# Patient Record
Sex: Male | Born: 1960 | Race: White | Hispanic: No | Marital: Married | Smoking: Never smoker
Health system: Southern US, Community
[De-identification: ages and names within clinical notes are randomized; demographics above are authoritative.]

## PROBLEM LIST (undated history)

## (undated) DIAGNOSIS — I10 Essential (primary) hypertension: Secondary | ICD-10-CM

## (undated) DIAGNOSIS — N281 Cyst of kidney, acquired: Secondary | ICD-10-CM

## (undated) DIAGNOSIS — K824 Cholesterolosis of gallbladder: Secondary | ICD-10-CM

## (undated) DIAGNOSIS — K589 Irritable bowel syndrome without diarrhea: Secondary | ICD-10-CM

## (undated) DIAGNOSIS — H547 Unspecified visual loss: Secondary | ICD-10-CM

## (undated) DIAGNOSIS — K859 Acute pancreatitis without necrosis or infection, unspecified: Secondary | ICD-10-CM

## (undated) DIAGNOSIS — K760 Fatty (change of) liver, not elsewhere classified: Secondary | ICD-10-CM

## (undated) DIAGNOSIS — K08109 Complete loss of teeth, unspecified cause, unspecified class: Secondary | ICD-10-CM

## (undated) DIAGNOSIS — I499 Cardiac arrhythmia, unspecified: Secondary | ICD-10-CM

## (undated) DIAGNOSIS — Z8639 Personal history of other endocrine, nutritional and metabolic disease: Secondary | ICD-10-CM

## (undated) DIAGNOSIS — R011 Cardiac murmur, unspecified: Secondary | ICD-10-CM

## (undated) DIAGNOSIS — J45909 Unspecified asthma, uncomplicated: Secondary | ICD-10-CM

## (undated) DIAGNOSIS — J449 Chronic obstructive pulmonary disease, unspecified: Secondary | ICD-10-CM

## (undated) DIAGNOSIS — K8010 Calculus of gallbladder with chronic cholecystitis without obstruction: Secondary | ICD-10-CM

## (undated) DIAGNOSIS — E119 Type 2 diabetes mellitus without complications: Secondary | ICD-10-CM

## (undated) DIAGNOSIS — E785 Hyperlipidemia, unspecified: Secondary | ICD-10-CM

## (undated) DIAGNOSIS — Z9889 Other specified postprocedural states: Secondary | ICD-10-CM

## (undated) DIAGNOSIS — K219 Gastro-esophageal reflux disease without esophagitis: Secondary | ICD-10-CM

## (undated) DIAGNOSIS — K5790 Diverticulosis of intestine, part unspecified, without perforation or abscess without bleeding: Secondary | ICD-10-CM

## (undated) DIAGNOSIS — R112 Nausea with vomiting, unspecified: Secondary | ICD-10-CM

## (undated) DIAGNOSIS — F79 Unspecified intellectual disabilities: Secondary | ICD-10-CM

## (undated) DIAGNOSIS — Z8619 Personal history of other infectious and parasitic diseases: Secondary | ICD-10-CM

## (undated) HISTORY — DX: Unspecified intellectual disabilities: F79

## (undated) HISTORY — DX: Cardiac arrhythmia, unspecified: I49.9

## (undated) HISTORY — DX: Irritable bowel syndrome, unspecified: K58.9

## (undated) HISTORY — DX: Essential (primary) hypertension: I10

## (undated) HISTORY — DX: Type 2 diabetes mellitus without complications: E11.9

## (undated) HISTORY — PX: HEMORRHOID SURGERY: SHX153

## (undated) HISTORY — PX: WISDOM TOOTH EXTRACTION: SHX21

## (undated) HISTORY — DX: Personal history of other infectious and parasitic diseases: Z86.19

## (undated) HISTORY — PX: COLONOSCOPY: SHX174

## (undated) HISTORY — DX: Acute pancreatitis without necrosis or infection, unspecified: K85.90

## (undated) HISTORY — DX: Cyst of kidney, acquired: N28.1

## (undated) HISTORY — DX: Gastro-esophageal reflux disease without esophagitis: K21.9

## (undated) HISTORY — DX: Personal history of other endocrine, nutritional and metabolic disease: Z86.39

## (undated) HISTORY — PX: CARDIAC CATHETERIZATION: SHX172

## (undated) HISTORY — DX: Unspecified asthma, uncomplicated: J45.909

## (undated) HISTORY — PX: KNEE SURGERY: SHX244

## (undated) HISTORY — PX: OTHER SURGICAL HISTORY: SHX169

## (undated) HISTORY — DX: Hyperlipidemia, unspecified: E78.5

---

## 2003-12-04 ENCOUNTER — Other Ambulatory Visit: Payer: Self-pay

## 2004-05-15 ENCOUNTER — Emergency Department: Payer: Self-pay | Admitting: Emergency Medicine

## 2004-05-15 ENCOUNTER — Other Ambulatory Visit: Payer: Self-pay

## 2004-10-05 ENCOUNTER — Emergency Department: Payer: Self-pay | Admitting: Emergency Medicine

## 2004-10-06 ENCOUNTER — Emergency Department: Payer: Self-pay | Admitting: Emergency Medicine

## 2004-12-10 ENCOUNTER — Emergency Department: Payer: Self-pay | Admitting: Emergency Medicine

## 2005-03-11 ENCOUNTER — Emergency Department: Payer: Self-pay | Admitting: Unknown Physician Specialty

## 2005-08-26 ENCOUNTER — Other Ambulatory Visit: Payer: Self-pay

## 2005-08-26 ENCOUNTER — Emergency Department: Payer: Self-pay | Admitting: Emergency Medicine

## 2005-08-27 ENCOUNTER — Ambulatory Visit: Payer: Self-pay | Admitting: Emergency Medicine

## 2005-10-10 ENCOUNTER — Ambulatory Visit: Payer: Self-pay | Admitting: Family Medicine

## 2005-10-11 ENCOUNTER — Ambulatory Visit: Payer: Self-pay | Admitting: Family Medicine

## 2005-10-31 ENCOUNTER — Emergency Department: Payer: Self-pay | Admitting: Internal Medicine

## 2006-03-02 ENCOUNTER — Emergency Department: Payer: Self-pay | Admitting: Emergency Medicine

## 2006-04-09 ENCOUNTER — Emergency Department: Payer: Self-pay | Admitting: Emergency Medicine

## 2006-05-18 ENCOUNTER — Emergency Department: Payer: Self-pay | Admitting: Emergency Medicine

## 2006-05-21 ENCOUNTER — Ambulatory Visit: Payer: Self-pay | Admitting: Emergency Medicine

## 2006-09-05 ENCOUNTER — Emergency Department: Payer: Self-pay | Admitting: Emergency Medicine

## 2006-09-09 ENCOUNTER — Emergency Department: Payer: Self-pay | Admitting: Internal Medicine

## 2006-09-20 ENCOUNTER — Emergency Department: Payer: Self-pay | Admitting: Internal Medicine

## 2006-09-22 ENCOUNTER — Other Ambulatory Visit: Payer: Self-pay

## 2006-09-22 ENCOUNTER — Emergency Department: Payer: Self-pay | Admitting: Emergency Medicine

## 2007-02-23 ENCOUNTER — Inpatient Hospital Stay: Payer: Self-pay | Admitting: Internal Medicine

## 2007-04-11 ENCOUNTER — Emergency Department: Payer: Self-pay | Admitting: Emergency Medicine

## 2007-05-30 ENCOUNTER — Other Ambulatory Visit: Payer: Self-pay

## 2007-05-30 ENCOUNTER — Emergency Department: Payer: Self-pay | Admitting: Emergency Medicine

## 2007-07-28 ENCOUNTER — Other Ambulatory Visit: Payer: Self-pay

## 2007-07-28 ENCOUNTER — Ambulatory Visit: Payer: Self-pay | Admitting: Urology

## 2007-10-25 ENCOUNTER — Emergency Department: Payer: Self-pay | Admitting: Emergency Medicine

## 2007-10-26 ENCOUNTER — Other Ambulatory Visit: Payer: Self-pay

## 2007-11-02 ENCOUNTER — Emergency Department: Payer: Self-pay | Admitting: Emergency Medicine

## 2007-12-26 ENCOUNTER — Encounter: Payer: Self-pay | Admitting: Podiatry

## 2007-12-31 ENCOUNTER — Emergency Department: Payer: Self-pay | Admitting: Emergency Medicine

## 2008-01-18 ENCOUNTER — Encounter: Payer: Self-pay | Admitting: Podiatry

## 2008-05-09 ENCOUNTER — Inpatient Hospital Stay: Payer: Self-pay | Admitting: Internal Medicine

## 2008-05-11 ENCOUNTER — Ambulatory Visit: Payer: Self-pay | Admitting: Cardiology

## 2008-08-30 ENCOUNTER — Ambulatory Visit: Payer: Self-pay | Admitting: Family Medicine

## 2008-10-12 ENCOUNTER — Emergency Department: Payer: Self-pay | Admitting: Emergency Medicine

## 2008-12-27 ENCOUNTER — Emergency Department: Payer: Self-pay | Admitting: Emergency Medicine

## 2009-02-09 ENCOUNTER — Inpatient Hospital Stay: Payer: Self-pay | Admitting: Internal Medicine

## 2009-04-26 ENCOUNTER — Emergency Department: Payer: Self-pay | Admitting: Emergency Medicine

## 2009-04-30 ENCOUNTER — Inpatient Hospital Stay: Payer: Self-pay | Admitting: Internal Medicine

## 2009-10-23 ENCOUNTER — Emergency Department: Payer: Self-pay | Admitting: Emergency Medicine

## 2009-11-28 ENCOUNTER — Ambulatory Visit: Payer: Self-pay | Admitting: Family Medicine

## 2010-02-20 ENCOUNTER — Inpatient Hospital Stay: Payer: Self-pay | Admitting: Internal Medicine

## 2011-03-20 DIAGNOSIS — E119 Type 2 diabetes mellitus without complications: Secondary | ICD-10-CM

## 2011-03-20 HISTORY — DX: Type 2 diabetes mellitus without complications: E11.9

## 2012-03-05 ENCOUNTER — Emergency Department: Payer: Self-pay | Admitting: Emergency Medicine

## 2012-12-26 ENCOUNTER — Ambulatory Visit: Payer: Self-pay | Admitting: Family Medicine

## 2013-01-20 ENCOUNTER — Ambulatory Visit: Payer: Self-pay | Admitting: Nephrology

## 2013-09-30 LAB — PSA: PSA: 0.3

## 2013-10-01 ENCOUNTER — Ambulatory Visit: Payer: Self-pay | Admitting: Family Medicine

## 2013-10-01 DIAGNOSIS — K824 Cholesterolosis of gallbladder: Secondary | ICD-10-CM | POA: Insufficient documentation

## 2014-04-27 DIAGNOSIS — J45909 Unspecified asthma, uncomplicated: Secondary | ICD-10-CM | POA: Diagnosis not present

## 2014-04-27 DIAGNOSIS — E785 Hyperlipidemia, unspecified: Secondary | ICD-10-CM | POA: Diagnosis not present

## 2014-04-27 DIAGNOSIS — J40 Bronchitis, not specified as acute or chronic: Secondary | ICD-10-CM | POA: Diagnosis not present

## 2014-04-27 DIAGNOSIS — E119 Type 2 diabetes mellitus without complications: Secondary | ICD-10-CM | POA: Diagnosis not present

## 2014-04-27 LAB — LIPID PANEL
Cholesterol: 158 mg/dL (ref 0–200)
HDL: 32 mg/dL — AB (ref 35–70)
LDL Cholesterol: 87 mg/dL
TRIGLYCERIDES: 197 mg/dL — AB (ref 40–160)

## 2014-06-09 DIAGNOSIS — R079 Chest pain, unspecified: Secondary | ICD-10-CM | POA: Diagnosis not present

## 2014-06-09 DIAGNOSIS — I10 Essential (primary) hypertension: Secondary | ICD-10-CM | POA: Diagnosis not present

## 2014-06-09 DIAGNOSIS — R0602 Shortness of breath: Secondary | ICD-10-CM | POA: Insufficient documentation

## 2014-06-09 DIAGNOSIS — E119 Type 2 diabetes mellitus without complications: Secondary | ICD-10-CM | POA: Diagnosis not present

## 2014-06-09 DIAGNOSIS — E780 Pure hypercholesterolemia: Secondary | ICD-10-CM | POA: Diagnosis not present

## 2014-06-23 DIAGNOSIS — R0602 Shortness of breath: Secondary | ICD-10-CM | POA: Diagnosis not present

## 2014-06-23 DIAGNOSIS — R079 Chest pain, unspecified: Secondary | ICD-10-CM | POA: Diagnosis not present

## 2014-06-23 HISTORY — PX: NM GATED MYOCARDIAL STUDY (ARMX HX): HXRAD1849

## 2014-07-28 DIAGNOSIS — R5383 Other fatigue: Secondary | ICD-10-CM | POA: Diagnosis not present

## 2014-07-28 DIAGNOSIS — E119 Type 2 diabetes mellitus without complications: Secondary | ICD-10-CM | POA: Diagnosis not present

## 2014-07-28 DIAGNOSIS — J439 Emphysema, unspecified: Secondary | ICD-10-CM | POA: Diagnosis not present

## 2014-07-28 LAB — CBC AND DIFFERENTIAL
HCT: 44 % (ref 41–53)
HCT: 44 % (ref 41–53)
HEMOGLOBIN: 14.8 g/dL (ref 13.5–17.5)
HEMOGLOBIN: 14.8 g/dL (ref 13.5–17.5)
PLATELETS: 172 10*3/uL (ref 150–399)
Platelets: 172 10*3/uL (ref 150–399)
WBC: 5.7 10^3/mL
WBC: 5.7 10^3/mL

## 2014-07-28 LAB — BASIC METABOLIC PANEL
BUN: 8 mg/dL (ref 4–21)
BUN: 8 mg/dL (ref 4–21)
CREATININE: 0.8 mg/dL (ref 0.6–1.3)
Creatinine: 0.8 mg/dL (ref 0.6–1.3)
Glucose: 173 mg/dL
Glucose: 173 mg/dL
Potassium: 4.5 mmol/L (ref 3.4–5.3)
Potassium: 4.5 mmol/L (ref 3.4–5.3)
SODIUM: 137 mmol/L (ref 137–147)
Sodium: 137 mmol/L (ref 137–147)

## 2014-07-28 LAB — TSH
TSH: 1.67 u[IU]/mL (ref 0.41–5.90)
TSH: 1.67 u[IU]/mL (ref 0.41–5.90)

## 2014-07-28 LAB — HEPATIC FUNCTION PANEL
ALT: 76 U/L — AB (ref 10–40)
ALT: 76 U/L — AB (ref 10–40)
AST: 54 U/L — AB (ref 14–40)
AST: 54 U/L — AB (ref 14–40)

## 2014-07-28 LAB — HEMOGLOBIN A1C
Hgb A1c MFr Bld: 7.3 % — AB (ref 4.0–6.0)
Hgb A1c MFr Bld: 7.3 % — AB (ref 4.0–6.0)

## 2014-09-21 ENCOUNTER — Other Ambulatory Visit: Payer: Self-pay

## 2014-09-21 DIAGNOSIS — E119 Type 2 diabetes mellitus without complications: Secondary | ICD-10-CM

## 2014-09-21 MED ORDER — GLIPIZIDE ER 2.5 MG PO TB24: 2.5000 mg | ORAL_TABLET | ORAL | Status: DC

## 2014-09-21 NOTE — Telephone Encounter (Signed)
Pharmacy request refill.

## 2014-09-29 ENCOUNTER — Telehealth: Payer: Self-pay | Admitting: Family Medicine

## 2014-09-29 NOTE — Telephone Encounter (Signed)
Pt's wife called saying his One Touch meter is broke.  Still works but the cap is broken off.  She wants to know if you have anymore samples.  You gave him this last one.  She does not want to have to pay for a new one.  Her call back is [phone].

## 2014-09-30 NOTE — Telephone Encounter (Signed)
OK to give a sample One touch meter if we have one, If not then call one into his pharmacy. Thanks.

## 2014-10-04 NOTE — Telephone Encounter (Signed)
Patient notified that meter ready to pick-up.

## 2014-10-07 ENCOUNTER — Encounter: Payer: Self-pay | Admitting: Family Medicine

## 2014-10-07 ENCOUNTER — Ambulatory Visit (INDEPENDENT_AMBULATORY_CARE_PROVIDER_SITE_OTHER): Payer: Medicare Other | Admitting: Family Medicine

## 2014-10-07 VITALS — BP 120/72 | HR 58 | Temp 97.7°F | Resp 16 | Wt 189.0 lb

## 2014-10-07 DIAGNOSIS — I499 Cardiac arrhythmia, unspecified: Secondary | ICD-10-CM | POA: Insufficient documentation

## 2014-10-07 DIAGNOSIS — E701 Other hyperphenylalaninemias: Secondary | ICD-10-CM | POA: Insufficient documentation

## 2014-10-07 DIAGNOSIS — G44209 Tension-type headache, unspecified, not intractable: Secondary | ICD-10-CM

## 2014-10-07 DIAGNOSIS — E700 Classical phenylketonuria: Secondary | ICD-10-CM | POA: Insufficient documentation

## 2014-10-07 DIAGNOSIS — M25539 Pain in unspecified wrist: Secondary | ICD-10-CM | POA: Insufficient documentation

## 2014-10-07 DIAGNOSIS — R519 Headache, unspecified: Secondary | ICD-10-CM | POA: Insufficient documentation

## 2014-10-07 DIAGNOSIS — N281 Cyst of kidney, acquired: Secondary | ICD-10-CM | POA: Insufficient documentation

## 2014-10-07 DIAGNOSIS — Z8719 Personal history of other diseases of the digestive system: Secondary | ICD-10-CM | POA: Insufficient documentation

## 2014-10-07 DIAGNOSIS — R109 Unspecified abdominal pain: Secondary | ICD-10-CM | POA: Insufficient documentation

## 2014-10-07 DIAGNOSIS — M25519 Pain in unspecified shoulder: Secondary | ICD-10-CM | POA: Insufficient documentation

## 2014-10-07 DIAGNOSIS — R079 Chest pain, unspecified: Secondary | ICD-10-CM | POA: Insufficient documentation

## 2014-10-07 DIAGNOSIS — R5383 Other fatigue: Secondary | ICD-10-CM | POA: Insufficient documentation

## 2014-10-07 DIAGNOSIS — R51 Headache: Secondary | ICD-10-CM

## 2014-10-07 DIAGNOSIS — K859 Acute pancreatitis without necrosis or infection, unspecified: Secondary | ICD-10-CM | POA: Insufficient documentation

## 2014-10-07 DIAGNOSIS — M542 Cervicalgia: Secondary | ICD-10-CM | POA: Diagnosis not present

## 2014-10-07 DIAGNOSIS — R062 Wheezing: Secondary | ICD-10-CM | POA: Diagnosis not present

## 2014-10-07 DIAGNOSIS — IMO0002 Reserved for concepts with insufficient information to code with codable children: Secondary | ICD-10-CM | POA: Insufficient documentation

## 2014-10-07 DIAGNOSIS — K589 Irritable bowel syndrome without diarrhea: Secondary | ICD-10-CM | POA: Insufficient documentation

## 2014-10-07 DIAGNOSIS — R740 Nonspecific elevation of levels of transaminase and lactic acid dehydrogenase [LDH]: Secondary | ICD-10-CM

## 2014-10-07 MED ORDER — TIZANIDINE HCL 4 MG PO TABS: 4.0000 mg | ORAL_TABLET | Freq: Four times a day (QID) | ORAL | Status: DC | PRN

## 2014-10-07 MED ORDER — MONTELUKAST SODIUM 10 MG PO TABS: 10.0000 mg | ORAL_TABLET | Freq: Every day | ORAL | Status: DC

## 2014-10-07 MED ORDER — AZITHROMYCIN 250 MG PO TABS: ORAL_TABLET | ORAL | Status: AC

## 2014-10-07 NOTE — Progress Notes (Signed)
Patient: [name] Male    DOB: 19-Nov-1960   54 y.o.   MRN: [id number] Visit Date: 10/09/2014  Today's Provider: Lelon Huh, MD   Chief Complaint  Patient presents with  . Headache    x 2 weeks   Subjective:    Headache  This is a new problem. Episode onset: 2 weeks. The problem occurs intermittently. The problem has been unchanged. The pain is located in the bilateral and occipital region. The pain radiates to the right neck and left neck. The pain quality is not similar to prior headaches. The quality of the pain is described as sharp and shooting. Associated symptoms include abdominal pain, anorexia, back pain, coughing, dizziness, neck pain, tinnitus (right ear), weakness and weight loss. Pertinent negatives include no blurred vision, drainage, ear pain, eye pain, eye redness, eye watering, facial sweating, fever, hearing loss, rhinorrhea, seizures, sinus pressure, sore throat, swollen glands, visual change or vomiting. Exacerbated by: turning head. He has tried nothing for the symptoms.   Wheezing States he has been wheezing more in the last couple of weeks associated with coughing and being a little short of breath. He doesn't think he inhalers are working very well.      Allergies  Allergen Reactions  . Bee Venom     All kinds of bees  . Other     Certain powders   Previous Medications   FLUTICASONE (FLOVENT HFA) 110 MCG/ACT INHALER    Inhale 2 puffs into the lungs daily.   FLUTICASONE PROPIONATE, INHAL, (FLOVENT DISKUS) 100 MCG/BLIST AEPB    Inhale 1 puff into the lungs 2 (two) times daily.   GLIPIZIDE (GLUCOTROL XL) 2.5 MG 24 HR TABLET    Take 1 tablet by mouth every morning.   GLUCOSE BLOOD TEST STRIP    1 strip daily.   GUAIFENESIN (MUCINEX) 600 MG 12 HR TABLET    Take 1 tablet by mouth daily as needed.   LISINOPRIL (PRINIVIL,ZESTRIL) 20 MG TABLET    Take 1 tablet by mouth daily.   LOPERAMIDE-SIMETHICONE (IMODIUM MULTI-SYMPTOM RELIEF) 2-125 MG TABS     Take 1 tablet by mouth daily as needed.   METFORMIN (GLUCOPHAGE-XR) 500 MG 24 HR TABLET    Take 2 tablets by mouth daily.   NABUMETONE (RELAFEN) 750 MG TABLET    Take 1 tablet by mouth 2 (two) times daily as needed. For pain   OMEPRAZOLE 20 MG TBEC    Take 1 tablet by mouth daily.   ONETOUCH DELICA LANCETS FINE MISC    1 application.   OXYMETAZOLINE (AFRIN NODRIP SINUS) 0.05 % NASAL SPRAY    Place 1 spray into the nose daily as needed.   SIMVASTATIN (ZOCOR) 20 MG TABLET    Take 1 tablet by mouth daily.   TRIAMCINOLONE CREAM (KENALOG) 0.5 %    Apply 1 application topically 2 (two) times daily. to affected area    Review of Systems  Constitutional: Positive for weight loss. Negative for fever.  HENT: Positive for tinnitus (right ear). Negative for ear pain, hearing loss, rhinorrhea, sinus pressure and sore throat.   Eyes: Negative for blurred vision, pain and redness.  Respiratory: Positive for cough and wheezing.   Gastrointestinal: Positive for abdominal pain and anorexia. Negative for vomiting.  Musculoskeletal: Positive for back pain and neck pain.  Neurological: Positive for dizziness, weakness and headaches. Negative for seizures.    History  Substance Use Topics  . Smoking status: Never Smoker   .  Smokeless tobacco: Not on file  . Alcohol Use: No   Objective:   BP 120/72 mmHg  Pulse 58  Temp(Src) 97.7 F (36.5 C) (Oral)  Resp 16  Wt 189 lb (85.73 kg)  SpO2 95%  Physical Exam   General Appearance:    Alert, cooperative, no distress  Eyes:    PERRL, conjunctiva/corneas clear, EOM's intact       Lungs:     Clear to auscultation bilaterally, respirations unlabored  Heart:    Regular rate and rhythm  Neurologic:   Awake, alert, oriented x 3. No apparent focal neurological           defect.   MS:   Tender along course of cervical spine and paracervical muscles. Normal strength and somatosensation of both UEs.    Spirometry: Normal     Assessment & Plan:     1.  Wheezing Lungs clear today with normal spirometry. May be mild asthma exacerbation.  - Spirometry with graph - montelukast (SINGULAIR) 10 MG tablet; Take 1 tablet (10 mg total) by mouth at bedtime.  Dispense: 30 tablet; Refill: 3 - azithromycin (ZITHROMAX) 250 MG tablet; 2 by mouth today, then 1 daily for 4 days  Dispense: 6 tablet; Refill: 0  2. 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 Try muld muscle relaxer.  - tiZANidine (ZANAFLEX) 4 MG tablet; Take 1 tablet (4 mg total) by mouth every 6 (six) hours as needed (headaches).  Dispense: 30 tablet; Refill: 0  3. Neck pain Consider XR C-spine if tizanidine is not effective.  - tiZANidine (ZANAFLEX) 4 MG tablet; Take 1 tablet (4 mg total) by mouth every 6 (six) hours as needed (headaches).  Dispense: 30 tablet; Refill: 0         Lelon Huh, MD  [facility]

## 2014-10-21 DIAGNOSIS — J439 Emphysema, unspecified: Secondary | ICD-10-CM

## 2014-10-21 DIAGNOSIS — K824 Cholesterolosis of gallbladder: Secondary | ICD-10-CM

## 2014-10-21 DIAGNOSIS — R740 Nonspecific elevation of levels of transaminase and lactic acid dehydrogenase [LDH]: Secondary | ICD-10-CM

## 2014-10-21 DIAGNOSIS — I152 Hypertension secondary to endocrine disorders: Secondary | ICD-10-CM | POA: Insufficient documentation

## 2014-10-21 DIAGNOSIS — R079 Chest pain, unspecified: Secondary | ICD-10-CM

## 2014-10-21 DIAGNOSIS — I10 Essential (primary) hypertension: Secondary | ICD-10-CM

## 2014-10-21 DIAGNOSIS — E1159 Type 2 diabetes mellitus with other circulatory complications: Secondary | ICD-10-CM | POA: Insufficient documentation

## 2014-10-21 DIAGNOSIS — J45909 Unspecified asthma, uncomplicated: Secondary | ICD-10-CM

## 2014-10-21 DIAGNOSIS — R7401 Elevation of levels of liver transaminase levels: Secondary | ICD-10-CM

## 2014-10-21 DIAGNOSIS — E1169 Type 2 diabetes mellitus with other specified complication: Secondary | ICD-10-CM | POA: Insufficient documentation

## 2014-10-21 DIAGNOSIS — E785 Hyperlipidemia, unspecified: Secondary | ICD-10-CM

## 2014-10-21 DIAGNOSIS — K5790 Diverticulosis of intestine, part unspecified, without perforation or abscess without bleeding: Secondary | ICD-10-CM | POA: Insufficient documentation

## 2014-10-21 DIAGNOSIS — L282 Other prurigo: Secondary | ICD-10-CM

## 2014-10-21 DIAGNOSIS — J309 Allergic rhinitis, unspecified: Secondary | ICD-10-CM | POA: Insufficient documentation

## 2014-10-21 DIAGNOSIS — K219 Gastro-esophageal reflux disease without esophagitis: Secondary | ICD-10-CM

## 2014-10-21 DIAGNOSIS — K861 Other chronic pancreatitis: Secondary | ICD-10-CM

## 2014-10-21 DIAGNOSIS — K7581 Nonalcoholic steatohepatitis (NASH): Secondary | ICD-10-CM

## 2014-10-21 DIAGNOSIS — E119 Type 2 diabetes mellitus without complications: Secondary | ICD-10-CM

## 2014-10-21 DIAGNOSIS — I209 Angina pectoris, unspecified: Secondary | ICD-10-CM | POA: Insufficient documentation

## 2014-10-21 DIAGNOSIS — K760 Fatty (change of) liver, not elsewhere classified: Secondary | ICD-10-CM | POA: Insufficient documentation

## 2014-10-21 DIAGNOSIS — M1990 Unspecified osteoarthritis, unspecified site: Secondary | ICD-10-CM

## 2014-10-22 ENCOUNTER — Ambulatory Visit
Admission: RE | Admit: 2014-10-22 | Discharge: 2014-10-22 | Disposition: A | Discharge status: Home / Self Care | Payer: Medicare Other | Source: Ambulatory Visit | Attending: Family Medicine | Admitting: Family Medicine

## 2014-10-22 ENCOUNTER — Encounter: Payer: Self-pay | Admitting: Family Medicine

## 2014-10-22 ENCOUNTER — Ambulatory Visit (INDEPENDENT_AMBULATORY_CARE_PROVIDER_SITE_OTHER): Payer: Medicare Other | Admitting: Family Medicine

## 2014-10-22 VITALS — BP 114/70 | HR 55 | Temp 97.9°F | Resp 16 | Wt 189.0 lb

## 2014-10-22 DIAGNOSIS — M542 Cervicalgia: Secondary | ICD-10-CM | POA: Diagnosis present

## 2014-10-22 DIAGNOSIS — R1084 Generalized abdominal pain: Secondary | ICD-10-CM | POA: Diagnosis not present

## 2014-10-22 DIAGNOSIS — E119 Type 2 diabetes mellitus without complications: Secondary | ICD-10-CM | POA: Diagnosis not present

## 2014-10-22 DIAGNOSIS — K7689 Other specified diseases of liver: Secondary | ICD-10-CM | POA: Diagnosis not present

## 2014-10-22 DIAGNOSIS — M5032 Other cervical disc degeneration, mid-cervical region: Secondary | ICD-10-CM | POA: Diagnosis not present

## 2014-10-22 DIAGNOSIS — R945 Abnormal results of liver function studies: Secondary | ICD-10-CM

## 2014-10-22 LAB — POCT GLYCOSYLATED HEMOGLOBIN (HGB A1C)
Est. average glucose Bld gHb Est-mCnc: 143
Hemoglobin A1C: 6.6

## 2014-10-22 MED ORDER — MOMETASONE FURO-FORMOTEROL FUM 100-5 MCG/ACT IN AERO: 2.0000 | INHALATION_SPRAY | Freq: Two times a day (BID) | RESPIRATORY_TRACT | Status: DC

## 2014-10-22 NOTE — Patient Instructions (Signed)
Please go to [facility] for Xray of neck.

## 2014-10-22 NOTE — Progress Notes (Signed)
Patient: [name]. Male    DOB: October 03, 1960   54 y.o.   MRN: [id number] Visit Date: 10/22/2014  Today's Provider: Lelon Huh, MD   Chief Complaint  Patient presents with  . Follow-up  . Diabetes  . Hypertension  . Gastrophageal Reflux  . Hyperlipidemia   Subjective:    HPI  Diabetes Mellitus Type II, Follow-up:   Lab Results  Component Value Date   HGBA1C 7.3* 07/28/2014   Last seen for diabetes 3 months ago.  Management changes included Started glipazide 2.5 mgqd. He reports good compliance with treatment. He is not having side effects.  Home blood sugar records: fasting range: low to mid 100s  Episodes of hypoglycemia? None   Current diet: in general, an "unhealthy" diet Current exercise: no regular exercise  ------------------------------------------------------------------------   Hypertension, follow-up:  BP Readings from Last 3 Encounters:  10/22/14 114/70  07/28/14 102/66    He was last seen for hypertension 3 months ago.  BP at that visit was 102/66. Management changes since that visit include none. He reports good compliance with treatment. He is not having side effects. none  He sometimes exercising. He is not adherent to low salt diet.   Outside blood pressures are N/A. He is experiencing none.  Patient denies none.   Cardiovascular risk factors include diabetes mellitus.  Use of agents associated with hypertension: .   ------------------------------------------------------------------------    Lipid/Cholesterol, Follow-up:   Last seen for this 3 months ago.  Management changes since that visit include none.  Last Lipid Panel:    Component Value Date/Time   CHOL 158 04/27/2014   TRIG 197* 04/27/2014   HDL 32* 04/27/2014   LDLCALC 87 04/27/2014    He reports good compliance with treatment. He is not having side effects. none  Wt Readings from Last 3 Encounters:  10/22/14 189 lb (85.73 kg)  07/28/14 191 lb  (86.637 kg)    ------------------------------------------------------------------------   GERD, Follow up:  The patient was last seen for GERD 3 months ago. Changes made since that visit include none.  He reports good compliance with treatment. He is not having side effects. none.  He IS experiencing heartburn, wheezing and none. He is NOT experiencing none.  ------------------------------------------------------------------------   He complains of occasional tingling sensation going into left arm.  It seems to effect entire arm, although he is a fairly poor historian and not clearly describing distribution of sensation. Denies any weakness of arm or hand. No neck pain.     Allergies  Allergen Reactions  . Bee Venom Hives    All kinds of bees  . Sulfa Antibiotics     Other reaction(s): Unknown   Previous Medications   FLUTICASONE PROPIONATE, INHAL, (FLOVENT DISKUS) 100 MCG/BLIST AEPB    Inhale into the lungs.   GLIPIZIDE (GLUCOTROL XL) 2.5 MG 24 HR TABLET    Take 1 tablet (2.5 mg total) by mouth every morning.   GUAIFENESIN (MUCINEX) 600 MG 12 HR TABLET    Take by mouth as needed.   LISINOPRIL (PRINIVIL,ZESTRIL) 20 MG TABLET    Take 20 mg by mouth daily.   METFORMIN (GLUCOPHAGE) 500 MG TABLET    Take 500 mg by mouth daily.   NABUMETONE (RELAFEN) 750 MG TABLET    Take 750 mg by mouth 2 (two) times daily as needed.   OMEPRAZOLE (PRILOSEC) 20 MG CAPSULE    Take 20 mg by mouth daily.   OXYMETAZOLINE (AFRIN NODRIP SINUS)  0.05 % NASAL SPRAY    Place 1 spray into both nostrils 2 (two) times daily.   SIMVASTATIN (ZOCOR) 20 MG TABLET    Take 20 mg by mouth daily.    Review of Systems  Constitutional: Negative for fever, chills and fatigue.  Respiratory: Positive for shortness of breath.   Cardiovascular: Negative for chest pain and palpitations.  Musculoskeletal: Positive for neck pain. Negative for joint swelling and neck stiffness.  Neurological: Positive for headaches.  Negative for dizziness, weakness and light-headedness.    History  Substance Use Topics  . Smoking status: Never Smoker   . Smokeless tobacco: Never Used  . Alcohol Use: No   Objective:   BP 114/70 mmHg  Pulse 55  Temp(Src) 97.9 F (36.6 C) (Oral)  Resp 16  Wt 189 lb (85.73 kg)  SpO2 97%  Physical Exam  General Appearance:    Alert, cooperative, no distress, obese  Eyes:    PERRL, conjunctiva/corneas clear, EOM's intact       Lungs:     Clear to auscultation bilaterally, respirations unlabored  Heart:    Regular rate and rhythm. No carotid bruits.   Neurologic:   Awake, alert, oriented x 3. No apparent focal neurological           defect.   Abd:   Mild diffuse tenderness, more so in epigastrium. No masses, No rebound, No guarding. Normoactive bowel sounds.        Assessment & Plan:     1. Diabetes mellitus type 2, controlled Fairly well controlled. Continue current medications.   - POCT glycosylated hemoglobin (Hb A1C)  2. Neck pain Associated with tingling in left arm, likely radiculopathy.  - DG Cervical Spine Complete; Future  3. Liver function abnormality - Hepatic function panel  4. Generalized abdominal pain Vague, poorly described. Recommend GI referral if labs and ultrasound are negative.  - US Abdomen Complete; Future - Amylase        Lelon Huh, MD  [facility]

## 2014-10-23 LAB — HEPATIC FUNCTION PANEL
ALT: 53 IU/L — ABNORMAL HIGH (ref 0–44)
AST: 43 IU/L — ABNORMAL HIGH (ref 0–40)
Albumin: 4.5 g/dL (ref 3.5–5.5)
Alkaline Phosphatase: 117 IU/L (ref 39–117)
BILIRUBIN TOTAL: 0.8 mg/dL (ref 0.0–1.2)
BILIRUBIN, DIRECT: 0.21 mg/dL (ref 0.00–0.40)
TOTAL PROTEIN: 6.7 g/dL (ref 6.0–8.5)

## 2014-10-23 LAB — AMYLASE: AMYLASE: 56 U/L (ref 31–124)

## 2014-10-25 ENCOUNTER — Telehealth: Payer: Self-pay | Admitting: Family Medicine

## 2014-10-25 NOTE — Telephone Encounter (Signed)
See result notes.

## 2014-10-25 NOTE — Telephone Encounter (Signed)
See results notes.

## 2014-10-25 NOTE — Telephone Encounter (Signed)
Pt called to get results from his Xray do on 10/22/14. Thanks TNP

## 2014-10-25 NOTE — Telephone Encounter (Signed)
Pt 's wife calling for test results.  Please call her back.  Thanks , Con Memos

## 2014-10-26 ENCOUNTER — Ambulatory Visit
Admission: RE | Admit: 2014-10-26 | Discharge: 2014-10-26 | Disposition: A | Discharge status: Home / Self Care | Payer: Medicare Other | Source: Ambulatory Visit | Attending: Family Medicine | Admitting: Family Medicine

## 2014-10-26 ENCOUNTER — Other Ambulatory Visit: Payer: Self-pay | Admitting: *Deleted

## 2014-10-26 ENCOUNTER — Telehealth: Payer: Self-pay | Admitting: *Deleted

## 2014-10-26 DIAGNOSIS — N281 Cyst of kidney, acquired: Secondary | ICD-10-CM | POA: Diagnosis not present

## 2014-10-26 DIAGNOSIS — R1084 Generalized abdominal pain: Secondary | ICD-10-CM | POA: Diagnosis present

## 2014-10-26 DIAGNOSIS — E119 Type 2 diabetes mellitus without complications: Secondary | ICD-10-CM

## 2014-10-26 DIAGNOSIS — I77811 Abdominal aortic ectasia: Secondary | ICD-10-CM | POA: Insufficient documentation

## 2014-10-26 MED ORDER — GLIPIZIDE ER 2.5 MG PO TB24: 2.5000 mg | ORAL_TABLET | ORAL | Status: DC

## 2014-10-26 NOTE — Telephone Encounter (Signed)
Patient is requesting x-ray results. Please advise?

## 2014-10-26 NOTE — Telephone Encounter (Addendum)
error

## 2014-10-27 ENCOUNTER — Encounter: Payer: Self-pay | Admitting: Family Medicine

## 2014-10-27 NOTE — Telephone Encounter (Signed)
See other message.

## 2014-11-01 ENCOUNTER — Telehealth: Payer: Self-pay | Admitting: Family Medicine

## 2014-11-01 DIAGNOSIS — R1084 Generalized abdominal pain: Secondary | ICD-10-CM

## 2014-11-01 NOTE — Telephone Encounter (Signed)
Pt is requesting referral to GI for abdominal pain

## 2014-11-01 NOTE — Telephone Encounter (Signed)
Please refer as requested

## 2014-11-02 ENCOUNTER — Encounter: Payer: Self-pay | Admitting: Gastroenterology

## 2014-11-04 ENCOUNTER — Telehealth: Payer: Self-pay | Admitting: Family Medicine

## 2014-11-04 NOTE — Telephone Encounter (Signed)
Need to return in November for follow up diabetes.

## 2014-11-04 NOTE — Telephone Encounter (Signed)
Pt's wife wants to know when pt needs to come back in for f/u for his diabetes

## 2014-11-05 NOTE — Telephone Encounter (Signed)
Appointment scheduled for 01/31/2015/MW

## 2014-11-30 ENCOUNTER — Other Ambulatory Visit: Payer: Self-pay

## 2014-11-30 ENCOUNTER — Encounter: Payer: Self-pay | Admitting: Gastroenterology

## 2014-11-30 ENCOUNTER — Ambulatory Visit (INDEPENDENT_AMBULATORY_CARE_PROVIDER_SITE_OTHER): Payer: Medicare Other | Admitting: Gastroenterology

## 2014-11-30 VITALS — BP 137/82 | HR 53 | Temp 97.3°F | Ht 63.0 in | Wt 186.2 lb

## 2014-11-30 DIAGNOSIS — G8929 Other chronic pain: Secondary | ICD-10-CM | POA: Diagnosis not present

## 2014-11-30 DIAGNOSIS — R1031 Right lower quadrant pain: Secondary | ICD-10-CM | POA: Diagnosis not present

## 2014-11-30 NOTE — Progress Notes (Signed)
Gastroenterology Consultation  Referring Provider:     Birdie Sons, MD Primary Care Physician:  Lelon Huh, MD Primary Gastroenterologist:  Dr. Allen Norris     Reason for Consultation:     Abdominal pain        HPI:   Alex Tat. is a 54 y.o. y/o male referred for consultation & management of Abdominal pain by Dr. Lelon Huh, MD.   This patient comes today with a report of abdominal pain. The patient states that the abdominal pain has been present for last few years. He states that he was being worked up for kidney stones but never sore a gastroenterologist for his abdominal pain.The patient come to his wife today who reports that he is not taking care of his diabetes and hides sugar in the house.The patient reports that eating and drinking does not make the abdominal pain any worse. He also reports that moving his bowels do not change the characteristic of his pain. He does report that he has seen some blood in his bowel movements but usually only on toilet paper. The patient knows he has hemorrhoids. The pain is exacerbated by him moving around and relieved by him relaxing.  Past Medical History  Diagnosis Date  . Emphysema of lung   . Allergy   . Asthma   . Arrhythmia   . History of chicken pox   . History of measles as a child   . Diabetes mellitus without complication   . Hyperlipidemia   . Hypertension   . GERD (gastroesophageal reflux disease)   . Arthritis     Past Surgical History  Procedure Laterality Date  . Ligament removal  of left thumb      Dr. Cleda Mccreedy  . Cardiac catheterization      [facility]  . Ligament removal Left     of left thumb: dr. Cleda Mccreedy  . Cardiac catherization      [facility]    Prior to Admission medications   Medication Sig Start Date End Date Taking? Authorizing Provider  glipiZIDE (GLUCOTROL XL) 2.5 MG 24 hr tablet Take 1 tablet (2.5 mg total) by mouth every morning. 10/26/14  Yes Birdie Sons, MD  glucose blood test strip 1 strip daily.  07/15/14  Yes Historical Provider, MD  lisinopril (PRINIVIL,ZESTRIL) 20 MG tablet Take 20 mg by mouth daily.   Yes Historical Provider, MD  metFORMIN (GLUCOPHAGE-XR) 500 MG 24 hr tablet Take 2 tablets by mouth daily. 06/17/14  Yes Historical Provider, MD  mometasone-formoterol (DULERA) 100-5 MCG/ACT AERO Inhale 2 puffs into the lungs 2 (two) times daily. 10/22/14  Yes Birdie Sons, MD  montelukast (SINGULAIR) 10 MG tablet Take 1 tablet (10 mg total) by mouth at bedtime. 10/07/14  Yes Birdie Sons, MD  omeprazole (PRILOSEC) 20 MG capsule Take 20 mg by mouth daily.   Yes Historical Provider, MD  [facility] DELICA LANCETS FINE MISC 1 application. 08/13/14  Yes Historical Provider, MD  simvastatin (ZOCOR) 20 MG tablet Take 20 mg by mouth daily.   Yes Historical Provider, MD  tiZANidine (ZANAFLEX) 4 MG tablet Take 1 tablet (4 mg total) by mouth every 6 (six) hours as needed (headaches). 10/07/14  Yes Birdie Sons, MD  triamcinolone cream (KENALOG) 0.5 % Apply 1 application topically 2 (two) times daily. to affected area 07/28/14  Yes Historical Provider, MD    Family History  Problem Relation Age of Onset  . Cancer Mother     Throat cancer  .  Diabetes Father      Social History  Substance Use Topics  . Smoking status: Never Smoker   . Smokeless tobacco: Never Used  . Alcohol Use: No    Allergies as of 11/30/2014 - Review Complete 11/30/2014  Allergen Reaction Noted  . Bee venom  10/07/2014  . Bee venom Hives 10/22/2014  . Other  10/09/2014  . Sulfa antibiotics  10/22/2014    Review of Systems:    All systems reviewed and negative except where noted in HPI.   Physical Exam:  BP 137/82 mmHg  Pulse 53  Temp(Src) 97.3 F (36.3 C) (Oral)  Ht 5' 3" (1.6 m)  Wt 186 lb 3.2 oz (84.46 kg)  BMI 32.99 kg/m2 No LMP for male patient. Psych:  Alert and cooperative. Normal mood and affect. General:   Alert,  Well-developed, Obese, well-nourished, pleasant and cooperative in NAD Head:   Normocephalic and atraumatic. Eyes:  Sclera clear, no icterus.   Conjunctiva pink. Ears:  Normal auditory acuity. Nose:  No deformity, discharge, or lesions. Mouth:  No deformity or lesions,oropharynx pink & moist. Neck:  Supple; no masses or thyromegaly. Lungs:  Respirations even and unlabored.  Clear throughout to auscultation.   No wheezes, crackles, or rhonchi. No acute distress. Heart:  Regular rate and rhythm; no murmurs, clicks, rubs, or gallops. Abdomen:  Normal bowel sounds.  No bruits.  Soft,  Positive tender and non-distended without masses, hepatosplenomegaly or hernias noted.  No guarding or rebound tenderness.  Positive Carnett sign.   Rectal:  Deferred.  Msk:  Symmetrical without gross deformities.  Good, equal movement & strength bilaterally. Pulses:  Normal pulses noted. Extremities:  No clubbing or edema.  No cyanosis. Neurologic:  Alert and oriented x3;  grossly normal neurologically. Skin:  Intact without significant lesions or rashes.  No jaundice. Lymph Nodes:  No significant cervical adenopathy. Psych:  Alert and cooperative. Normal mood and affect.  Imaging Studies: No results found.  Assessment and Plan:   [name]. is a 54 y.o. y/o male Who comes in today with a report of chronic abdominal pain. The patient abdominal pain is reproducible with flexion of the abdominal wall muscles with one finger palpation. The patient also suffers from back pain. The patient abdominal pain is not related to any G.I. Symptoms such as nausea vomiting diarrhea or constipation. He has had rectal bleeding and has been told that he should undergo colonoscopy. The patient refuses the procedure at the present time. He states he will contact me if he changes his mind.   Note: This dictation was prepared with Dragon dictation along with smaller phrase technology. Any transcriptional errors that result from this process are unintentional.

## 2014-12-04 ENCOUNTER — Other Ambulatory Visit: Payer: Self-pay | Admitting: Family Medicine

## 2014-12-08 ENCOUNTER — Other Ambulatory Visit: Payer: Self-pay | Admitting: *Deleted

## 2014-12-08 MED ORDER — OMEPRAZOLE 20 MG PO CPDR: 20.0000 mg | DELAYED_RELEASE_CAPSULE | Freq: Every day | ORAL | Status: DC

## 2014-12-08 NOTE — Telephone Encounter (Signed)
Refill request for Omeprazole 20 mg qd Last filled by MD on- 06/17/2014 #30 x5 Last Appt: 10/22/2014 Next Appt: 01/31/2015 Please advise refill?

## 2014-12-20 ENCOUNTER — Encounter: Payer: Self-pay | Admitting: Family Medicine

## 2014-12-20 ENCOUNTER — Ambulatory Visit (INDEPENDENT_AMBULATORY_CARE_PROVIDER_SITE_OTHER): Payer: Medicare Other | Admitting: Family Medicine

## 2014-12-20 VITALS — BP 120/80 | HR 57 | Temp 98.0°F | Resp 16 | Wt 188.0 lb

## 2014-12-20 DIAGNOSIS — K861 Other chronic pancreatitis: Secondary | ICD-10-CM | POA: Diagnosis not present

## 2014-12-20 DIAGNOSIS — K644 Residual hemorrhoidal skin tags: Secondary | ICD-10-CM | POA: Insufficient documentation

## 2014-12-20 DIAGNOSIS — K648 Other hemorrhoids: Secondary | ICD-10-CM

## 2014-12-20 DIAGNOSIS — M545 Low back pain, unspecified: Secondary | ICD-10-CM

## 2014-12-20 NOTE — Progress Notes (Signed)
Patient: [name]. Male    DOB: 1960-11-06   54 y.o.   MRN: [id number] Visit Date: 12/20/2014  Today's Provider: Lelon Huh, MD   Chief Complaint  Patient presents with  . Rectal Bleeding   Subjective:    Rectal Bleeding  The current episode started more than 2 weeks ago. The problem occurs occasionally. The stool is described as mixed with blood. Associated symptoms include abdominal pain, diarrhea, nausea and rectal pain. Pertinent negatives include no fever and no vomiting.  Blood on tissue after every bowel movement for the last 2-3 weeks associated rectal pain. Was using Preperation -H which was helping with pain and reduced bleeding. Ran out of preparation H and states has gotten worse.   Back Pain States he has had persistent low back pain for several months, no radiation. Usually across mid back and lower back muscles. Not taken anything for it. Has history of neck pain with cervical DDD.     Allergies  Allergen Reactions  . Bee Venom     All kinds of bees  . Bee Venom Hives    All kinds of bees  . Other     Certain powders  . Sulfa Antibiotics     Other reaction(s): Unknown   Previous Medications   GLIPIZIDE (GLUCOTROL XL) 2.5 MG 24 HR TABLET    Take 1 tablet (2.5 mg total) by mouth every morning.   GLUCOSE BLOOD TEST STRIP    1 strip daily.   LISINOPRIL (PRINIVIL,ZESTRIL) 20 MG TABLET    Take 20 mg by mouth daily.   METFORMIN (GLUCOPHAGE-XR) 500 MG 24 HR TABLET    Take 2 tablets by mouth daily.   MOMETASONE-FORMOTEROL (DULERA) 100-5 MCG/ACT AERO    Inhale 2 puffs into the lungs 2 (two) times daily.   MONTELUKAST (SINGULAIR) 10 MG TABLET    Take 1 tablet (10 mg total) by mouth at bedtime.   OMEPRAZOLE (PRILOSEC) 20 MG CAPSULE    Take 1 capsule (20 mg total) by mouth daily.   ONETOUCH DELICA LANCETS FINE MISC    1 application.   SIMVASTATIN (ZOCOR) 20 MG TABLET    Take 20 mg by mouth daily.   TIZANIDINE (ZANAFLEX) 4 MG TABLET    Take 1 tablet  (4 mg total) by mouth every 6 (six) hours as needed (headaches).   TRIAMCINOLONE CREAM (KENALOG) 0.5 %    apply to affected area twice a day    Review of Systems  Constitutional: Negative for fever.  Gastrointestinal: Positive for nausea, abdominal pain, diarrhea, hematochezia and rectal pain. Negative for vomiting.  Musculoskeletal: Positive for back pain.       Right lower back pain    Social History  Substance Use Topics  . Smoking status: Never Smoker   . Smokeless tobacco: Never Used  . Alcohol Use: No   Objective:   BP 120/80 mmHg  Pulse 57  Temp(Src) 98 F (36.7 C) (Oral)  Resp 16  Wt 188 lb (85.276 kg)  SpO2 97%  Physical Exam  Rectal: Multiple tender non-thrombosed external hemorrhoids. No active bleeding.   MS: Mild tenderness lumbar spine and paralumbar muscles. No LE weakness. No s/s deficits.     Assessment & Plan:     1. External hemorrhoids Restart Preparation H which had been effective. May also try OTC hydrocortison.  2. Right-sided low back pain without sciatica See how Xrays look. Consider PT.  - DG Lumbar Spine Complete; Future  Lelon Huh, MD  [facility]

## 2014-12-20 NOTE — Patient Instructions (Addendum)
Can use 1% hydrocortisone cream in addition to Preparation H

## 2014-12-21 ENCOUNTER — Other Ambulatory Visit: Payer: Self-pay | Admitting: Family Medicine

## 2014-12-21 ENCOUNTER — Other Ambulatory Visit: Payer: Self-pay | Admitting: *Deleted

## 2014-12-21 ENCOUNTER — Ambulatory Visit
Admission: RE | Admit: 2014-12-21 | Discharge: 2014-12-21 | Disposition: A | Discharge status: Home / Self Care | Payer: Medicare Other | Source: Ambulatory Visit | Attending: Family Medicine | Admitting: Family Medicine

## 2014-12-21 DIAGNOSIS — M545 Low back pain, unspecified: Secondary | ICD-10-CM

## 2014-12-21 DIAGNOSIS — J4521 Mild intermittent asthma with (acute) exacerbation: Secondary | ICD-10-CM

## 2014-12-21 MED ORDER — SIMVASTATIN 20 MG PO TABS: 20.0000 mg | ORAL_TABLET | Freq: Every day | ORAL | Status: DC

## 2014-12-21 MED ORDER — MOMETASONE FURO-FORMOTEROL FUM 100-5 MCG/ACT IN AERO: 2.0000 | INHALATION_SPRAY | Freq: Two times a day (BID) | RESPIRATORY_TRACT | Status: DC

## 2014-12-21 MED ORDER — FLUTICASONE PROPIONATE (INHAL) 100 MCG/BLIST IN AEPB: 1.0000 | INHALATION_SPRAY | Freq: Two times a day (BID) | RESPIRATORY_TRACT | Status: DC

## 2014-12-21 NOTE — Telephone Encounter (Signed)
Ok to refill med? Please advise. Thanks!

## 2014-12-21 NOTE — Telephone Encounter (Signed)
Pt states he has rec'd samples of Dulera 110-5MCG when he was her.  Pt is requesting a Rx for this.  [facility].  CB#[phone]/MW

## 2014-12-22 ENCOUNTER — Telehealth: Payer: Self-pay | Admitting: Family Medicine

## 2014-12-22 ENCOUNTER — Telehealth: Payer: Self-pay | Admitting: *Deleted

## 2014-12-22 DIAGNOSIS — M545 Low back pain: Secondary | ICD-10-CM

## 2014-12-22 NOTE — Telephone Encounter (Signed)
Patient's wife [name] notified of results. Expressed understanding.

## 2014-12-22 NOTE — Telephone Encounter (Signed)
Pt's wife [name] would like to get the results of the Xray that was done 12/21/14. Please advise. Thanks TNP

## 2014-12-24 NOTE — Telephone Encounter (Signed)
Pt referral entered.

## 2014-12-27 ENCOUNTER — Other Ambulatory Visit: Payer: Self-pay | Admitting: Family Medicine

## 2014-12-27 MED ORDER — NAPROXEN 500 MG PO TABS: 500.0000 mg | ORAL_TABLET | Freq: Two times a day (BID) | ORAL | Status: DC

## 2014-12-27 NOTE — Telephone Encounter (Signed)
Have sent rx for naprosyn to his pharmacy

## 2014-12-27 NOTE — Telephone Encounter (Signed)
Patient is calling stating that he is needing a stronger pain medicine. The tiZANidine (ZANAFLEX) 4 MG tablet that you prescribed is not working. His back is paining him really bad and he is wanting to know if he needs an office visit or could you switch the medication to something stronger. Please call to advise. KB

## 2014-12-28 NOTE — Telephone Encounter (Signed)
Left message to call back.  Thanks,

## 2014-12-28 NOTE — Telephone Encounter (Signed)
Advised pt's wife as directed.  Thanks,

## 2015-01-09 ENCOUNTER — Other Ambulatory Visit: Payer: Self-pay | Admitting: Family Medicine

## 2015-01-10 ENCOUNTER — Telehealth: Payer: Self-pay | Admitting: Family Medicine

## 2015-01-10 ENCOUNTER — Other Ambulatory Visit: Payer: Self-pay | Admitting: Family Medicine

## 2015-01-10 DIAGNOSIS — M542 Cervicalgia: Secondary | ICD-10-CM

## 2015-01-10 DIAGNOSIS — G44209 Tension-type headache, unspecified, not intractable: Secondary | ICD-10-CM

## 2015-01-10 NOTE — Telephone Encounter (Signed)
Pt contacted office for refill request on the following medications:  metFORMIN (GLUCOPHAGE-XR) 500 MG 24 hr tablet.  [facility].  VE#[phone]/[id number]  [facility] states this was requested and denied yesterday/MW

## 2015-01-10 NOTE — Telephone Encounter (Signed)
Pt contacted office for refill request on the following medications:  metFORMIN (GLUCOPHAGE-XR) 500 MG 24 hr tablet.  [facility] [place].  [facility] called stating this was requested and denied yesterday and not sure why/MW

## 2015-01-19 MED ORDER — NAPROXEN 500 MG PO TABS: 500.0000 mg | ORAL_TABLET | Freq: Two times a day (BID) | ORAL | Status: DC

## 2015-01-19 MED ORDER — METFORMIN HCL ER 500 MG PO TB24: 1000.0000 mg | ORAL_TABLET | Freq: Every day | ORAL | Status: DC

## 2015-01-19 MED ORDER — TIZANIDINE HCL 4 MG PO TABS: 4.0000 mg | ORAL_TABLET | Freq: Four times a day (QID) | ORAL | Status: DC | PRN

## 2015-01-19 NOTE — Telephone Encounter (Signed)
Pt's wife [name] stated she spoke with [facility] and was told they didn't have a RX for metFORMIN (GLUCOPHAGE-XR) 500 MG 24 hr tablet. [name] is requesting that we resend the RX or contact [employer].  [name] is also is requesting refills for  tiZANidine (ZANAFLEX) 4 MG tablet &  naproxen (NAPROSYN) 500 MG tablet.    Please advise. Thanks TNP

## 2015-01-31 ENCOUNTER — Ambulatory Visit (INDEPENDENT_AMBULATORY_CARE_PROVIDER_SITE_OTHER): Payer: Medicare Other | Admitting: Family Medicine

## 2015-01-31 ENCOUNTER — Encounter: Payer: Self-pay | Admitting: Family Medicine

## 2015-01-31 VITALS — BP 132/70 | HR 60 | Temp 97.8°F | Resp 16 | Ht 63.0 in | Wt 200.0 lb

## 2015-01-31 DIAGNOSIS — R062 Wheezing: Secondary | ICD-10-CM

## 2015-01-31 DIAGNOSIS — Z794 Long term (current) use of insulin: Secondary | ICD-10-CM | POA: Diagnosis not present

## 2015-01-31 DIAGNOSIS — J4521 Mild intermittent asthma with (acute) exacerbation: Secondary | ICD-10-CM | POA: Diagnosis not present

## 2015-01-31 DIAGNOSIS — E119 Type 2 diabetes mellitus without complications: Secondary | ICD-10-CM

## 2015-01-31 LAB — POCT GLYCOSYLATED HEMOGLOBIN (HGB A1C)
ESTIMATED AVERAGE GLUCOSE: 146
Hemoglobin A1C: 6.7

## 2015-01-31 MED ORDER — MOMETASONE FURO-FORMOTEROL FUM 200-5 MCG/ACT IN AERO: 2.0000 | INHALATION_SPRAY | Freq: Two times a day (BID) | RESPIRATORY_TRACT | Status: DC

## 2015-01-31 MED ORDER — MONTELUKAST SODIUM 10 MG PO TABS: 10.0000 mg | ORAL_TABLET | Freq: Every day | ORAL | Status: DC

## 2015-01-31 MED ORDER — ALBUTEROL SULFATE HFA 108 (90 BASE) MCG/ACT IN AERS: 2.0000 | INHALATION_SPRAY | Freq: Four times a day (QID) | RESPIRATORY_TRACT | Status: DC | PRN

## 2015-01-31 NOTE — Progress Notes (Signed)
Patient ID: Alex Metro., male   DOB: [birth year], 54 y.o.   MRN: [id number]       Patient: [name]. Male    DOB: 02-11-1961   54 y.o.   MRN: [id number] Visit Date: 01/31/2015  Today's Provider: Lelon Huh, MD   Chief Complaint  Patient presents with  . Diabetes    3 month FU  . Hypertension    6 month FU  . Hyperlipidemia    6 month FU    Subjective:    HPI  Diabetes Mellitus Type II, Follow-up:   Lab Results  Component Value Date   HGBA1C 6.7 01/31/2015   HGBA1C 6.6 10/22/2014   HGBA1C 7.3* 07/28/2014   HGBA1C 7.3* 07/28/2014   Last seen for diabetes 3 months ago.  Management since then includes no change. He reports good compliance with treatment. He is not having side effects.  Current symptoms include none and have been stable. Home blood sugar records: trend: stable  Fasting sugars in the mid 100s most of the time.   Episodes of hypoglycemia? no   Weight trend: fluctuating a bit Prior visit with dietician: no Current diet: in general, a "healthy" diet   Current exercise: walking  ------------------------------------------------------------------------   Hypertension, follow-up:  BP Readings from Last 3 Encounters:  01/31/15 132/70  12/20/14 120/80  11/30/14 137/82    He was last seen for hypertension 6 months ago.  Management since that visit includes no change.He reports good compliance with treatment. He is not having side effects.  He is exercising. He is not adherent to low salt diet.   Outside blood pressures are not checked. He is experiencing exertional chest pressure/discomfort.  Cardiovascular risk factors include diabetes mellitus.   ------------------------------------------------------------------------    Lipid/Cholesterol, Follow-up:   Last seen for this 6 months ago.  Management since that visit includes no change.  Last Lipid Panel:    Component Value Date/Time   CHOL 158 04/27/2014   TRIG  197* 04/27/2014   HDL 32* 04/27/2014   LDLCALC 87 04/27/2014    He reports good compliance with treatment. He is not having side effects.   Wt Readings from Last 3 Encounters:  01/31/15 200 lb (90.719 kg)  12/20/14 188 lb (85.276 kg)  11/30/14 186 lb 3.2 oz (84.46 kg)    ------------------------------------------------------------------------  Chest pain States he was changing his dog a couple of days ago when he developed chest pain, wheezine and shortness of breath and had to stop frequently. He had cardiac evaluation by Dr. Saralyn Pilar earlier this year which was unremarkable. Is using Dulera twice a day.    GERD Doing well with omeprazole, taking daily with no adverse effects.     Allergies  Allergen Reactions  . Bee Venom     All kinds of bees  . Bee Venom Hives    All kinds of bees  . Other     Certain powders  . Sulfa Antibiotics     Other reaction(s): Unknown   Previous Medications   FLUTICASONE PROPIONATE, INHAL, (FLOVENT DISKUS) 100 MCG/BLIST AEPB    Inhale 1 puff into the lungs 2 (two) times daily.   GLIPIZIDE (GLUCOTROL XL) 2.5 MG 24 HR TABLET    Take 1 tablet (2.5 mg total) by mouth every morning.   GLUCOSE BLOOD TEST STRIP    1 strip daily.   LISINOPRIL (PRINIVIL,ZESTRIL) 20 MG TABLET    take 1 tablet by mouth once daily   METFORMIN (GLUCOPHAGE-XR) 500 MG  24 HR TABLET    Take 2 tablets (1,000 mg total) by mouth daily.   MOMETASONE-FORMOTEROL (DULERA) 100-5 MCG/ACT AERO    Inhale 2 puffs into the lungs 2 (two) times daily.   MONTELUKAST (SINGULAIR) 10 MG TABLET    Take 1 tablet (10 mg total) by mouth at bedtime.   NAPROXEN (NAPROSYN) 500 MG TABLET    Take 1 tablet (500 mg total) by mouth 2 (two) times daily with a meal. As needed for pain   OMEPRAZOLE (PRILOSEC) 20 MG CAPSULE    Take 1 capsule (20 mg total) by mouth daily.   ONETOUCH DELICA LANCETS FINE MISC    1 application.   SIMVASTATIN (ZOCOR) 20 MG TABLET    Take 1 tablet (20 mg total) by mouth daily.    TIZANIDINE (ZANAFLEX) 4 MG TABLET    Take 1 tablet (4 mg total) by mouth every 6 (six) hours as needed (headaches).   TRIAMCINOLONE CREAM (KENALOG) 0.5 %    apply to affected area twice a day    Review of Systems  Social History  Substance Use Topics  . Smoking status: Never Smoker   . Smokeless tobacco: Never Used  . Alcohol Use: No   Objective:   BP 132/70 mmHg  Pulse 60  Temp(Src) 97.8 F (36.6 C)  Resp 16  Ht 5' 3" (1.6 m)  Wt 200 lb (90.719 kg)  BMI 35.44 kg/m2  Physical Exam   General Appearance:    Alert, cooperative, no distress, obese  Eyes:    PERRL, conjunctiva/corneas clear, EOM's intact       Lungs:     Clear to auscultation bilaterally, respirations unlabored  Heart:    Regular rate and rhythm  Neurologic:   Awake, alert, oriented x 3. No apparent focal neurological           defect.        Results for orders placed or performed in visit on 01/31/15  POCT glycosylated hemoglobin (Hb A1C)  Result Value Ref Range   Hemoglobin A1C 6.7        Assessment & Plan:     1. Controlled type 2 diabetes mellitus without complication, with long-term current use of insulin ([place]) Well controlled.  Continue current medications.   - POCT glycosylated hemoglobin (Hb A1C)  2. Wheezing  - montelukast (SINGULAIR) 10 MG tablet; Take 1 tablet (10 mg total) by mouth at bedtime.  Dispense: 30 tablet; Refill: 6  3. Asthma, mild intermittent, with acute exacerbation  Return in about 4 months (around 05/31/2015).        Lelon Huh, MD  [facility]

## 2015-02-18 ENCOUNTER — Other Ambulatory Visit: Payer: Self-pay | Admitting: Family Medicine

## 2015-03-15 ENCOUNTER — Other Ambulatory Visit: Payer: Self-pay | Admitting: Family Medicine

## 2015-05-10 ENCOUNTER — Other Ambulatory Visit: Payer: Self-pay | Admitting: *Deleted

## 2015-05-10 MED ORDER — METFORMIN HCL ER 500 MG PO TB24: 1000.0000 mg | ORAL_TABLET | Freq: Every day | ORAL | Status: DC

## 2015-05-12 ENCOUNTER — Ambulatory Visit (INDEPENDENT_AMBULATORY_CARE_PROVIDER_SITE_OTHER): Payer: Medicare Other | Admitting: Family Medicine

## 2015-05-12 ENCOUNTER — Encounter: Payer: Self-pay | Admitting: Family Medicine

## 2015-05-12 VITALS — BP 104/70 | HR 60 | Temp 95.0°F | Resp 16 | Ht 63.0 in | Wt 194.0 lb

## 2015-05-12 DIAGNOSIS — G5602 Carpal tunnel syndrome, left upper limb: Secondary | ICD-10-CM | POA: Diagnosis not present

## 2015-05-12 DIAGNOSIS — K219 Gastro-esophageal reflux disease without esophagitis: Secondary | ICD-10-CM | POA: Diagnosis not present

## 2015-05-12 DIAGNOSIS — I77811 Abdominal aortic ectasia: Secondary | ICD-10-CM

## 2015-05-12 DIAGNOSIS — Z0000 Encounter for general adult medical examination without abnormal findings: Secondary | ICD-10-CM | POA: Diagnosis not present

## 2015-05-12 DIAGNOSIS — K7581 Nonalcoholic steatohepatitis (NASH): Secondary | ICD-10-CM | POA: Diagnosis not present

## 2015-05-12 DIAGNOSIS — I10 Essential (primary) hypertension: Secondary | ICD-10-CM | POA: Diagnosis not present

## 2015-05-12 DIAGNOSIS — E785 Hyperlipidemia, unspecified: Secondary | ICD-10-CM

## 2015-05-12 DIAGNOSIS — E119 Type 2 diabetes mellitus without complications: Secondary | ICD-10-CM | POA: Diagnosis not present

## 2015-05-12 DIAGNOSIS — Z1159 Encounter for screening for other viral diseases: Secondary | ICD-10-CM | POA: Diagnosis not present

## 2015-05-12 DIAGNOSIS — Z794 Long term (current) use of insulin: Secondary | ICD-10-CM | POA: Diagnosis not present

## 2015-05-12 DIAGNOSIS — Z125 Encounter for screening for malignant neoplasm of prostate: Secondary | ICD-10-CM

## 2015-05-12 DIAGNOSIS — M2550 Pain in unspecified joint: Secondary | ICD-10-CM

## 2015-05-12 NOTE — Progress Notes (Signed)
Patient: Alex Niazi., Male    DOB: October 20, 1960, 55 y.o.   MRN: [id number] Visit Date: 05/12/2015  Today's Provider: Lelon Huh, MD   Chief Complaint  Patient presents with  . Medicare Wellness  . Hypertension  . Asthma  . Hyperlipidemia   Subjective:     Annual wellness visit Alex Arnett. is a 55 y.o. male. He feels fairly well. He reports exercising some. He reports he is sleeping well.  -----------------------------------------------------------      Diabetes Mellitus Type II, Follow-up:   Lab Results  Component Value Date   HGBA1C 6.7 01/31/2015   HGBA1C 6.6 10/22/2014   HGBA1C 7.3* 07/28/2014   HGBA1C 7.3* 07/28/2014   Last seen for diabetes 3 months ago.  Management since then includes; no changes. He reports good compliance with treatment. He is not having side effects. none Current symptoms include none and have been unchanged. Home blood sugar records: fasting range: 259  Episodes of hypoglycemia? no   Current Insulin Regimen: n/a Most Recent Eye Exam: due in April/2017 Weight trend: stable Prior visit with dietician: no Current diet: in general, an "unhealthy" diet Current exercise: bicycling  ----------------------------------------------------------------------   Hypertension, follow-up:  BP Readings from Last 3 Encounters:  05/12/15 104/70  01/31/15 132/70  12/20/14 120/80    He was last seen for hypertension 9 months ago.  BP at that visit was 132/70. Management since that visit includes; no changes.He reports good compliance with treatment. He is not having side effects. none  He is exercising. He is not adherent to low salt diet.   Outside blood pressures are n/a. He is experiencing none.  Patient denies none.   Cardiovascular risk factors include diabetes mellitus.  Use of agents associated with hypertension: none.    ----------------------------------------------------------------------    Lipid/Cholesterol, Follow-up:   Last seen for this 9 months ago.  Management since that visit includes; no changes.  Last Lipid Panel:    Component Value Date/Time   CHOL 158 04/27/2014   TRIG 197* 04/27/2014   HDL 32* 04/27/2014   LDLCALC 87 04/27/2014    He reports good compliance with treatment. He is not having side effects. none  Wt Readings from Last 3 Encounters:  05/12/15 194 lb (87.998 kg)  01/31/15 200 lb (90.719 kg)  12/20/14 188 lb (85.276 kg)    ----------------------------------------------------------------------   He complains of persistent diffuse joint pains and stiffness for several months, but also woke up today with pain left lower back. No radiation. He has also had pain shooting through his left arm and wrist with numbness in his left hand for months and getting worse. He states he has a history of carpal tunnel syndrome. He is not sure if he had surgery for this.   Review of Systems  Constitutional: Positive for diaphoresis.  HENT: Positive for congestion, sinus pressure and sore throat.   Eyes: Positive for photophobia, pain and redness.  Respiratory: Positive for shortness of breath and wheezing.   Cardiovascular: Positive for leg swelling.  Gastrointestinal: Positive for abdominal pain and diarrhea.  Endocrine: Positive for polydipsia, polyphagia and polyuria.  Genitourinary: Positive for frequency.  Musculoskeletal: Positive for myalgias, back pain, joint swelling, arthralgias and neck pain.  Skin: Positive for wound.  Allergic/Immunologic: Positive for environmental allergies.  Neurological: Positive for dizziness, speech difficulty, weakness, numbness and headaches.  Hematological: Bruises/bleeds easily.  Psychiatric/Behavioral: Positive for confusion and decreased concentration. The patient is nervous/anxious.     Social  History   Social History  . Marital  Status: Married    Spouse Name: N/A  . Number of Children: N/A  . Years of Education: N/A   Occupational History  . Disabled    Social History Main Topics  . Smoking status: Never Smoker   . Smokeless tobacco: Never Used  . Alcohol Use: No  . Drug Use: No  . Sexual Activity: Not on file   Other Topics Concern  . Not on file   Social History Narrative   ** Merged History Encounter **       ** Data from: 10/22/14 Enc Dept: [facility]       ** Data from: 10/07/14 Enc Dept: [facility]   Arrest: Arrested in 2012 for indecent liberties, required to wear ankle monitor    Past Medical History  Diagnosis Date  . Emphysema of lung ([place])   . Allergy   . Asthma   . Arrhythmia   . History of chicken pox   . History of measles as a child   . Diabetes mellitus without complication ([place])   . Hyperlipidemia   . Hypertension   . GERD (gastroesophageal reflux disease)   . Arthritis      Patient Active Problem List   Diagnosis Date Noted  . External hemorrhoids 12/20/2014  . Ectatic abdominal aorta ([place]) 10/26/2014  . Allergic rhinitis 10/21/2014  . Diverticulosis 10/21/2014  . Pancreatitis 10/21/2014  . Diabetes mellitus type 2, controlled ([place]) 10/21/2014  . Hypertension 10/21/2014  . Hyperlipidemia 10/21/2014  . Pulmonary emphysema ([place]) 10/21/2014  . Asthma 10/21/2014  . GERD (gastroesophageal reflux disease) 10/21/2014  . Steatohepatitis 10/21/2014  . Gallbladder polyp 10/21/2014  . 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 Arthritis 10/21/2014  . Chest pain 10/21/2014  . Elevated transaminase level 10/21/2014  . Allergic rhinitis 10/07/2014  . Arthritis 10/07/2014  . Asthma 10/07/2014  . Diabetes mellitus, type 2 ([place]) 10/07/2014  . Diverticulosis of colon 10/07/2014  . Elevation of level of transaminase or lactic acid dehydrogenase (LDH) 10/07/2014  . Emphysema of lung ([place]) 10/07/2014  . Fatigue 10/07/2014  . GERD (gastroesophageal reflux disease)  10/07/2014  . Cardiac arrhythmia 10/07/2014  . Hyperlipidemia 10/07/2014  . Essential (primary) hypertension 10/07/2014  . Irritable bowel syndrome 10/07/2014  . Pancreatitis 10/07/2014  . Papular urticaria 10/07/2014  . Phenylketonuria (PKU) ([place]) 10/07/2014  . Renal cyst, left 10/07/2014  . Steatohepatitis 10/07/2014  . Headache 10/07/2014  . Gall bladder polyp 10/01/2013    Past Surgical History  Procedure Laterality Date  . Ligament removal  of left thumb      Dr. Cleda Mccreedy  . Cardiac catheterization      [facility]  . Ligament removal Left     of left thumb: dr. Cleda Mccreedy  . Cardiac catherization      [facility]    His family history includes Cancer in his mother; Diabetes in his father.    Previous Medications   ALBUTEROL (PROVENTIL HFA;VENTOLIN HFA) 108 (90 BASE) MCG/ACT INHALER    Inhale 2 puffs into the lungs every 6 (six) hours as needed for wheezing.   GLIPIZIDE (GLUCOTROL XL) 2.5 MG 24 HR TABLET    Take 1 tablet (2.5 mg total) by mouth every morning.   GLUCOSE BLOOD TEST STRIP    1 strip daily.   LISINOPRIL (PRINIVIL,ZESTRIL) 20 MG TABLET    take 1 tablet by mouth once daily   METFORMIN (GLUCOPHAGE-XR) 500 MG 24 HR TABLET    Take 2 tablets (  1,000 mg total) by mouth daily.   MOMETASONE-FORMOTEROL (DULERA) 200-5 MCG/ACT AERO    Inhale 2 puffs into the lungs 2 (two) times daily.   MONTELUKAST (SINGULAIR) 10 MG TABLET    Take 1 tablet (10 mg total) by mouth at bedtime.   NAPROXEN (NAPROSYN) 500 MG TABLET    take 1 tablet by mouth twice a day with meals   OMEPRAZOLE (PRILOSEC) 20 MG CAPSULE    Take 1 capsule (20 mg total) by mouth daily.   ONETOUCH DELICA LANCETS FINE MISC    TEST once daily   SIMVASTATIN (ZOCOR) 20 MG TABLET    Take 1 tablet (20 mg total) by mouth daily.   TIZANIDINE (ZANAFLEX) 4 MG TABLET    Take 1 tablet (4 mg total) by mouth every 6 (six) hours as needed (headaches).   TRIAMCINOLONE CREAM (KENALOG) 0.5 %    apply to affected area twice a day    Patient Care  Team: Birdie Sons, MD as PCP - General (Family Medicine) Birdie Sons, MD (Family Medicine)     Objective:   Vitals: BP 104/70 mmHg  Pulse 60  Temp(Src) 95 F (35 C) (Oral)  Resp 16  Ht 5' 3" (1.6 m)  Wt 194 lb (87.998 kg)  BMI 34.37 kg/m2  SpO2 95%  Physical Exam    General Appearance:    Alert, cooperative, no distress, appears stated age, obese  Head:    Normocephalic, without obvious abnormality, atraumatic  Eyes:    PERRL, conjunctiva/corneas clear, EOM's intact, fundi    benign, both eyes       Ears:    Normal TM's and external ear canals, both ears  Nose:   Nares normal, septum midline, mucosa normal, no drainage   or sinus tenderness  Throat:   Lips, mucosa, and tongue normal; teeth and gums normal  Neck:   Supple, symmetrical, trachea midline, no adenopathy;       thyroid:  No enlargement/tenderness/nodules; no carotid   bruit or JVD  Back:     Symmetric, no curvature, ROM normal, no CVA tenderness  Lungs:     Clear to auscultation bilaterally, respirations unlabored  Chest wall:    No tenderness or deformity  Heart:    Regular rate and rhythm, S1 and S2 normal, no murmur, rub   or gallop  Abdomen:     Soft, non-tender, bowel sounds active all four quadrants,    no masses, no organomegaly  Genitalia:    deferred  Rectal:    deferred  Extremities:   Positive Tinels on left. Tender musculature of left lateral back  Pulses:   2+ and symmetric all extremities  Skin:   Skin color, texture, turgor normal, no rashes or lesions  Lymph nodes:   Cervical, supraclavicular, and axillary nodes normal  Neurologic:   CNII-XII intact. Normal strength, sensation and reflexes      throughout   Activities of Daily Living In your present state of health, do you have any difficulty performing the following activities: 05/12/2015  Hearing? Y  Vision? Y  Difficulty concentrating or making decisions? Y  Walking or climbing stairs? Y  Dressing or bathing? N  Doing  errands, shopping? Y      Depression Screen PHQ 2/9 Scores 05/12/2015  PHQ - 2 Score 0    Audit-C Alcohol Use Screening  Question Answer Points  How often do you have alcoholic drink? never 0  On days you do drink alcohol, how many drinks do  you typically consume? n/a 0  How oftey will you drink 6 or more in a total? never 0  Total Score:  0   A score of 3 or more in women, and 4 or more in men indicates increased risk for alcohol abuse, EXCEPT if all of the points are from question 1.      Assessment & Plan:     Annual physical  Reviewed patient's Family Medical History Reviewed and updated list of patient's medical providers Assessment of cognitive impairment was done Assessed patient's functional ability Established a written schedule for health screening [place] Completed and Reviewed  Exercise Activities and Dietary recommendations Goals    None      Immunization History  Administered Date(s) Administered  . Influenza-Unspecified 11/30/2014  . Pneumococcal Polysaccharide-23 12/22/2012    Health Maintenance  Topic Date Due  . Hepatitis C Screening  July 06, 1960  . FOOT EXAM  02/09/1973  . OPHTHALMOLOGY EXAM  02/09/1973  . HIV Screening  02/09/1978  . TETANUS/TDAP  02/09/1982  . COLONOSCOPY  02/09/2013  . HEMOGLOBIN A1C  07/31/2015  . INFLUENZA VACCINE  10/18/2015  . PNEUMOCOCCAL POLYSACCHARIDE VACCINE (2) 12/22/2017      Discussed health benefits of physical activity, and encouraged him to engage in regular exercise appropriate for his age and condition.    --------------------------------------------------------------------------------   1. Annual physical exam   2. Controlled type 2 diabetes mellitus without complication, with long-term current use of insulin ([facility]) Is scheduled to see Dr. Ellin Mayhew next month for diabetic eye exam - Hemoglobin A1c - Comprehensive metabolic panel  3. Steatohepatitis  - Comprehensive  metabolic panel - US Abdomen Limited RUQ; Future  4. Gastroesophageal reflux disease, esophagitis presence not specified   5. Hyperlipidemia  - Lipid panel  6. Essential hypertension  - Comprehensive metabolic panel  7. Ectatic abdominal aorta (HCC) U/s Q5 years  8. Need for hepatitis C screening test  - Hepatitis C antibody  9. Arthralgia  - ANA w/Reflex if Positive - Sed Rate (ESR) - Rheumatoid Factor  10. Prostate cancer screening  - PSA  11.  Carpal tunnel syndrome left.   He states this was treated with brace in the past, but doesn't think he ever had surgery.. Refer orthopedics for further evaluation If labs are normal.

## 2015-05-13 LAB — COMPREHENSIVE METABOLIC PANEL
A/G RATIO: 1.9 (ref 1.1–2.5)
ALBUMIN: 4.7 g/dL (ref 3.5–5.5)
ALK PHOS: 163 IU/L — AB (ref 39–117)
ALT: 56 IU/L — ABNORMAL HIGH (ref 0–44)
AST: 38 IU/L (ref 0–40)
BILIRUBIN TOTAL: 0.4 mg/dL (ref 0.0–1.2)
BUN/Creatinine Ratio: 10 (ref 9–20)
BUN: 9 mg/dL (ref 6–24)
CHLORIDE: 101 mmol/L (ref 96–106)
CO2: 21 mmol/L (ref 18–29)
Calcium: 9.7 mg/dL (ref 8.7–10.2)
Creatinine, Ser: 0.94 mg/dL (ref 0.76–1.27)
GFR calc Af Amer: 107 mL/min/{1.73_m2} (ref >59)
GFR calc non Af Amer: 93 mL/min/{1.73_m2} (ref >59)
GLUCOSE: 186 mg/dL — AB (ref 65–99)
Globulin, Total: 2.5 g/dL (ref 1.5–4.5)
POTASSIUM: 5.1 mmol/L (ref 3.5–5.2)
Sodium: 146 mmol/L — ABNORMAL HIGH (ref 134–144)
Total Protein: 7.2 g/dL (ref 6.0–8.5)

## 2015-05-13 LAB — LIPID PANEL
CHOL/HDL RATIO: 4.4 ratio (ref 0.0–5.0)
Cholesterol, Total: 171 mg/dL (ref 100–199)
HDL: 39 mg/dL — ABNORMAL LOW (ref >39)
LDL CALC: 91 mg/dL (ref 0–99)
TRIGLYCERIDES: 207 mg/dL — AB (ref 0–149)
VLDL Cholesterol Cal: 41 mg/dL — ABNORMAL HIGH (ref 5–40)

## 2015-05-13 LAB — RHEUMATOID FACTOR

## 2015-05-13 LAB — ANA W/REFLEX IF POSITIVE: ANA: NEGATIVE

## 2015-05-13 LAB — PSA: Prostate Specific Ag, Serum: 0.3 ng/mL (ref 0.0–4.0)

## 2015-05-13 LAB — HEMOGLOBIN A1C
ESTIMATED AVERAGE GLUCOSE: 163 mg/dL
Hgb A1c MFr Bld: 7.3 % — ABNORMAL HIGH (ref 4.8–5.6)

## 2015-05-13 LAB — SEDIMENTATION RATE: Sed Rate: 2 mm/hr (ref 0–30)

## 2015-05-13 LAB — HEPATITIS C ANTIBODY: Hep C Virus Ab: 0.2 s/co ratio (ref 0.0–0.9)

## 2015-05-16 ENCOUNTER — Telehealth: Payer: Self-pay

## 2015-05-16 ENCOUNTER — Ambulatory Visit: Payer: Medicare Other

## 2015-05-16 DIAGNOSIS — G5602 Carpal tunnel syndrome, left upper limb: Secondary | ICD-10-CM

## 2015-05-16 NOTE — Telephone Encounter (Signed)
-----  Message from Birdie Sons, MD sent at 05/15/2015  9:23 PM EST ----- Labs are all normal. Recommend referral to orthopedics for suspected carpal tunnel syndrome

## 2015-05-16 NOTE — Telephone Encounter (Signed)
Advised patient of results. Please refer patient to ortho. Thanks!

## 2015-05-24 ENCOUNTER — Ambulatory Visit
Admission: RE | Admit: 2015-05-24 | Discharge: 2015-05-24 | Disposition: A | Discharge status: Home / Self Care | Payer: Medicare Other | Source: Ambulatory Visit | Attending: Family Medicine | Admitting: Family Medicine

## 2015-05-24 DIAGNOSIS — K759 Inflammatory liver disease, unspecified: Secondary | ICD-10-CM | POA: Diagnosis not present

## 2015-05-24 DIAGNOSIS — K7581 Nonalcoholic steatohepatitis (NASH): Secondary | ICD-10-CM | POA: Diagnosis not present

## 2015-05-24 DIAGNOSIS — R932 Abnormal findings on diagnostic imaging of liver and biliary tract: Secondary | ICD-10-CM | POA: Diagnosis not present

## 2015-05-25 ENCOUNTER — Telehealth: Payer: Self-pay | Admitting: Family Medicine

## 2015-05-25 NOTE — Telephone Encounter (Signed)
Pt's wife [name] would like a call with the ultra sound results. The ultra sound was done on 05/24/15. Thanks TNP

## 2015-05-25 NOTE — Telephone Encounter (Signed)
Please advise [name] results from 05/24/2015?

## 2015-05-26 NOTE — Telephone Encounter (Signed)
See result note.

## 2015-05-31 ENCOUNTER — Ambulatory Visit: Payer: Self-pay | Admitting: Family Medicine

## 2015-06-01 ENCOUNTER — Other Ambulatory Visit: Payer: Self-pay | Admitting: Family Medicine

## 2015-06-01 ENCOUNTER — Ambulatory Visit: Payer: Self-pay | Admitting: Family Medicine

## 2015-06-07 ENCOUNTER — Other Ambulatory Visit: Payer: Self-pay | Admitting: *Deleted

## 2015-06-07 MED ORDER — OMEPRAZOLE 20 MG PO CPDR: 20.0000 mg | DELAYED_RELEASE_CAPSULE | Freq: Every day | ORAL | Status: DC

## 2015-06-22 DIAGNOSIS — H04123 Dry eye syndrome of bilateral lacrimal glands: Secondary | ICD-10-CM | POA: Diagnosis not present

## 2015-06-22 DIAGNOSIS — E119 Type 2 diabetes mellitus without complications: Secondary | ICD-10-CM | POA: Diagnosis not present

## 2015-06-22 LAB — HM DIABETES EYE EXAM

## 2015-06-30 ENCOUNTER — Telehealth: Payer: Self-pay | Admitting: *Deleted

## 2015-06-30 NOTE — Telephone Encounter (Addendum)
Patient's wife called office to get advise about pt's fasting BS being over 300 this morning. [name] later stated that pt checked BS again and it has come down to 277. [name] wanted advise about what they should do? Patient did have 24 hour stomach bug with diarrhea. Please advise?

## 2015-06-30 NOTE — Telephone Encounter (Signed)
Sugars can run up with viral infection. Stay well hydrated. Should come down when she feels better. Thanks.

## 2015-07-06 NOTE — Telephone Encounter (Signed)
Spoke with patients wife [name], and she states patients blood sugars have come down. His most recent blood sugar was a few moments ago and it was 210. Patient is no longer having diarrhea. I advised [name] as below and she verbally voiced understanding.

## 2015-07-07 ENCOUNTER — Encounter: Payer: Self-pay | Admitting: *Deleted

## 2015-07-29 ENCOUNTER — Encounter: Payer: Self-pay | Admitting: Family Medicine

## 2015-07-29 ENCOUNTER — Ambulatory Visit (INDEPENDENT_AMBULATORY_CARE_PROVIDER_SITE_OTHER): Payer: Medicare Other | Admitting: Family Medicine

## 2015-07-29 VITALS — BP 132/78 | HR 58 | Temp 97.6°F | Resp 16 | Wt 196.6 lb

## 2015-07-29 DIAGNOSIS — L989 Disorder of the skin and subcutaneous tissue, unspecified: Secondary | ICD-10-CM

## 2015-07-29 DIAGNOSIS — L980 Pyogenic granuloma: Secondary | ICD-10-CM | POA: Diagnosis not present

## 2015-07-29 DIAGNOSIS — L918 Other hypertrophic disorders of the skin: Secondary | ICD-10-CM | POA: Diagnosis not present

## 2015-07-29 NOTE — Progress Notes (Signed)
Subjective:     Patient ID: [name]., male   DOB: Mar 26, 1960, 55 y.o.   MRN: [id number]  HPI  Chief Complaint  Patient presents with  . Skin Problem    Patient comes in office today accompanied by his wife concerns of skin tag on his right arm that has been present for the past several years. Wife states that area is now turning black and had concerns that it is possible infected or something else.  . Groin Pain    Patient would like to address pain in his groin that is decribed as a shooting pain that has been occuring for the past 2 months  . Knee Pain    Patient would like to address bilateral knee pain that has been on going for the past two months.   He is apprehensive about removal today but his wife reassures him and he wishes to proceed to excision.He will discuss his chronic problems with his primary M.D., Dr. Caryn Section.   Review of Systems     Objective:   Physical Exam  Constitutional: He appears well-developed and well-nourished. No distress (mild anxiety).  Skin:  Right upper arm medial aspect with a 0.5 cm flesh colored skin tag. 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  Procedure note: Cleansed with alcohol and infiltrated with lidocaine with epinephrine. Betadine applied and lesion excised and sent for pathology. Pressure dressing/bandaid applied    Assessment:    1. Arm skin lesion, right - Dermatology pathology - PR EXC SKIN BENIG <0.5 CM TRUNK,ARM,LEG    Plan:    Leave on dressing overnight then clean daily with soap and water and apply bandaid.

## 2015-07-29 NOTE — Patient Instructions (Signed)
Leave dressing on tonight. Tomorrow start daily cleaning with soap and water and apply bandaid. We will call you with the pathology report.

## 2015-08-01 ENCOUNTER — Encounter: Payer: Self-pay | Admitting: *Deleted

## 2015-08-02 ENCOUNTER — Telehealth: Payer: Self-pay | Admitting: Family Medicine

## 2015-08-02 NOTE — Telephone Encounter (Signed)
Pt's wife calling to see if his test results are back yet.    Pt call back is [phone]  Thank sTeri

## 2015-08-02 NOTE — Telephone Encounter (Signed)
Returned call to [facility] and advised that results are not back yet.

## 2015-08-03 ENCOUNTER — Telehealth: Payer: Self-pay | Admitting: Family Medicine

## 2015-08-03 NOTE — Telephone Encounter (Signed)
Left message on vm letting pt know that the results are not back and we will call when they are.

## 2015-08-03 NOTE — Telephone Encounter (Signed)
Pt is calling for test results,  Pt call back [phone]  Thanks Con Memos

## 2015-08-04 ENCOUNTER — Telehealth: Payer: Self-pay | Admitting: Family Medicine

## 2015-08-04 NOTE — Telephone Encounter (Signed)
Pt is request results.  CB#[phone]/MW

## 2015-08-04 NOTE — Telephone Encounter (Signed)
Please review lab results and advise. KW

## 2015-08-04 NOTE — Telephone Encounter (Signed)
[id number]

## 2015-08-04 NOTE — Telephone Encounter (Signed)
Have not received biopsy results yet.

## 2015-08-04 NOTE — Telephone Encounter (Signed)
Pt's wife returned call. I advised that we haven't received the results. Thanks TNP

## 2015-08-05 ENCOUNTER — Telehealth: Payer: Self-pay | Admitting: Family Medicine

## 2015-08-05 NOTE — Telephone Encounter (Signed)
Pt is calling regarding his lab results from the biopsy from his skin tag.  Please call him back at [phone]  Thanks, Con Memos

## 2015-08-05 NOTE — Telephone Encounter (Signed)
Pathology results not available yet.

## 2015-08-05 NOTE — Telephone Encounter (Signed)
Have you seen results or a preliminary? Please let me know if I need to contact. KW

## 2015-08-05 NOTE — Telephone Encounter (Signed)
Wife has been advised report is still pending./ KW

## 2015-08-08 ENCOUNTER — Telehealth: Payer: Self-pay

## 2015-08-08 NOTE — Telephone Encounter (Signed)
Patient notified: "pyogenic granuloma"

## 2015-08-08 NOTE — Telephone Encounter (Signed)
Yes-see when the report will be completed.

## 2015-08-08 NOTE — Telephone Encounter (Signed)
Final report being faxed over now to your attn. KW

## 2015-08-08 NOTE — Telephone Encounter (Signed)
Patient is calling office back again in regards to pathology. Do you want me to contact [facility]? It has been past 5 days. KW

## 2015-08-16 ENCOUNTER — Other Ambulatory Visit: Payer: Self-pay | Admitting: *Deleted

## 2015-08-16 MED ORDER — LISINOPRIL 20 MG PO TABS: 20.0000 mg | ORAL_TABLET | Freq: Every day | ORAL | Status: DC

## 2015-08-16 MED ORDER — SIMVASTATIN 20 MG PO TABS: 20.0000 mg | ORAL_TABLET | Freq: Every day | ORAL | Status: DC

## 2015-08-19 ENCOUNTER — Other Ambulatory Visit: Payer: Self-pay | Admitting: Family Medicine

## 2015-08-22 ENCOUNTER — Encounter: Payer: Self-pay | Admitting: Family Medicine

## 2015-08-22 ENCOUNTER — Ambulatory Visit (INDEPENDENT_AMBULATORY_CARE_PROVIDER_SITE_OTHER): Payer: Medicare Other | Admitting: Family Medicine

## 2015-08-22 VITALS — BP 118/66 | HR 59 | Temp 97.9°F | Resp 16 | Wt 195.0 lb

## 2015-08-22 DIAGNOSIS — M25562 Pain in left knee: Secondary | ICD-10-CM | POA: Diagnosis not present

## 2015-08-22 DIAGNOSIS — I10 Essential (primary) hypertension: Secondary | ICD-10-CM

## 2015-08-22 DIAGNOSIS — E119 Type 2 diabetes mellitus without complications: Secondary | ICD-10-CM | POA: Diagnosis not present

## 2015-08-22 DIAGNOSIS — M25561 Pain in right knee: Secondary | ICD-10-CM | POA: Diagnosis not present

## 2015-08-22 DIAGNOSIS — M25569 Pain in unspecified knee: Secondary | ICD-10-CM | POA: Insufficient documentation

## 2015-08-22 DIAGNOSIS — E559 Vitamin D deficiency, unspecified: Secondary | ICD-10-CM | POA: Insufficient documentation

## 2015-08-22 DIAGNOSIS — K219 Gastro-esophageal reflux disease without esophagitis: Secondary | ICD-10-CM | POA: Diagnosis not present

## 2015-08-22 LAB — POCT GLYCOSYLATED HEMOGLOBIN (HGB A1C)
Est. average glucose Bld gHb Est-mCnc: 166
Hemoglobin A1C: 7.4

## 2015-08-22 LAB — POCT UA - MICROALBUMIN: MICROALBUMIN (UR) POC: 20 mg/L

## 2015-08-22 MED ORDER — METFORMIN HCL ER 500 MG PO TB24
1000.0000 mg | ORAL_TABLET | Freq: Two times a day (BID) | ORAL | Status: DC
Start: 2015-08-22 — End: 2016-09-24

## 2015-08-22 MED ORDER — VITAMIN D3 1.25 MG (50000 UT) PO CAPS: 50000.0000 [IU] | ORAL_CAPSULE | ORAL | Status: DC

## 2015-08-22 NOTE — Progress Notes (Signed)
Patient: [name]. Male    DOB: 1960/05/12   55 y.o.   MRN: [id number] Visit Date: 08/22/2015  Today's Provider: Lelon Huh, MD   Chief Complaint  Patient presents with  . Diabetes    follow up  . Hypertension    follow up  . Gastroesophageal Reflux    follow up   Subjective:    HPI  Diabetes Mellitus Type II, Follow-up:   Lab Results  Component Value Date   HGBA1C 7.3* 05/12/2015   HGBA1C 6.7 01/31/2015   HGBA1C 6.6 10/22/2014    Last seen for diabetes 3 months ago.  Management since then includes no changes. He reports good compliance with treatment. He is not having side effects.  Current symptoms include none and have been stable. Home blood sugar records: fasting range: 200-256  Episodes of hypoglycemia? no   Current Insulin Regimen: none Most Recent Eye Exam: <1 year Weight trend: stable Prior visit with dietician: no Current diet: in general, an "unhealthy" diet Current exercise: none  Pertinent Labs:    Component Value Date/Time   CHOL 171 05/12/2015 1133   CHOL 158 04/27/2014   TRIG 207* 05/12/2015 1133   HDL 39* 05/12/2015 1133   HDL 32* 04/27/2014   LDLCALC 91 05/12/2015 1133   LDLCALC 87 04/27/2014   CREATININE 0.94 05/12/2015 1133   CREATININE 0.8 07/28/2014   CREATININE 0.8 07/28/2014    Wt Readings from Last 3 Encounters:  08/22/15 195 lb (88.451 kg)  07/29/15 196 lb 9.6 oz (89.177 kg)  05/12/15 194 lb (87.998 kg)    ------------------------------------------------------------------------   Hypertension, follow-up:  BP Readings from Last 3 Encounters:  07/29/15 132/78  05/12/15 104/70  01/31/15 132/70    He was last seen for hypertension 3 months ago.  BP at that visit was 104/70. Management since that visit includes no changes. He reports good compliance with treatment. He is not having side effects.  He is not exercising. He is not adherent to low salt diet.   Outside blood pressures are not  being checked. He is experiencing chest pain and fatigue.  Patient denies dyspnea, irregular heart beat, lower extremity edema, near-syncope, palpitations, syncope and tachypnea.   Cardiovascular risk factors include diabetes mellitus, hypertension and male gender.  Use of agents associated with hypertension: none.     Weight trend: stable Wt Readings from Last 3 Encounters:  08/22/15 195 lb (88.451 kg)  07/29/15 196 lb 9.6 oz (89.177 kg)  05/12/15 194 lb (87.998 kg)    Current diet: in general, an "unhealthy" diet  ------------------------------------------------------------------------  Follow up GERD:  Last office visit was 3 months ago and no changes were made. Patient reports good compliance with treatment, good tolerance and good symptom control.     Allergies  Allergen Reactions  . Bee Venom     All kinds of bees  . Bee Venom Hives    All kinds of bees  . Other     Certain powders  . Sulfa Antibiotics     Other reaction(s): Unknown   Previous Medications   ALBUTEROL (PROVENTIL HFA;VENTOLIN HFA) 108 (90 BASE) MCG/ACT INHALER    Inhale 2 puffs into the lungs every 6 (six) hours as needed for wheezing.   FLOVENT DISKUS 100 MCG/BLIST AEPB       GLIPIZIDE (GLUCOTROL XL) 2.5 MG 24 HR TABLET    Take 1 tablet (2.5 mg total) by mouth every morning.   LISINOPRIL (PRINIVIL,ZESTRIL) 20 MG  TABLET    Take 1 tablet (20 mg total) by mouth daily.   METFORMIN (GLUCOPHAGE-XR) 500 MG 24 HR TABLET    Take 2 tablets (1,000 mg total) by mouth daily.   MOMETASONE-FORMOTEROL (DULERA) 200-5 MCG/ACT AERO    Inhale 2 puffs into the lungs 2 (two) times daily.   MONTELUKAST (SINGULAIR) 10 MG TABLET    Take 1 tablet (10 mg total) by mouth at bedtime.   NAPROXEN (NAPROSYN) 500 MG TABLET    Take 1 tablet (500 mg total) by mouth 2 (two) times daily with a meal.   OMEPRAZOLE (PRILOSEC) 20 MG CAPSULE    Take 1 capsule (20 mg total) by mouth daily.   ONE TOUCH ULTRA TEST TEST STRIP    TEST once daily    ONETOUCH DELICA LANCETS FINE MISC    TEST once daily   SIMVASTATIN (ZOCOR) 20 MG TABLET    Take 1 tablet (20 mg total) by mouth daily.   TIZANIDINE (ZANAFLEX) 4 MG TABLET    Take 1 tablet (4 mg total) by mouth every 6 (six) hours as needed (headaches).   TRIAMCINOLONE CREAM (KENALOG) 0.5 %    apply to affected area twice a day    Review of Systems  Constitutional: Negative for fever, chills and appetite change.  Respiratory: Negative for chest tightness, shortness of breath and wheezing.   Cardiovascular: Negative for chest pain and palpitations.  Gastrointestinal: Positive for abdominal pain (radiates into his back). Negative for nausea and vomiting.  Musculoskeletal: Positive for myalgias (leg pain).  Neurological: Positive for weakness (in legs).    Social History  Substance Use Topics  . Smoking status: Never Smoker   . Smokeless tobacco: Never Used  . Alcohol Use: No   Objective:   BP 118/66 mmHg  Pulse 59  Temp(Src) 97.9 F (36.6 C) (Oral)  Resp 16  Wt 195 lb (88.451 kg)  SpO2 96%  Physical Exam  General Appearance:    Alert, cooperative, no distress, obese  Eyes:    PERRL, conjunctiva/corneas clear, EOM's intact       Lungs:     Clear to auscultation bilaterally, respirations unlabored  Heart:    Regular rate and rhythm  Neurologic:   Awake, alert, oriented x 3. No apparent focal neurological           defect.        Results for orders placed or performed in visit on 08/22/15  POCT HgB A1C  Result Value Ref Range   Hemoglobin A1C 7.4    Est. average glucose Bld gHb Est-mCnc 166   POCT UA - Microalbumin  Result Value Ref Range   Microalbumin Ur, POC 20 mg/L   Creatinine, POC n/a mg/dL   Albumin/Creatinine Ratio, Urine, POC n/a        Assessment & Plan:     1. Controlled type 2 diabetes mellitus without complication, without long-term current use of insulin (HCC) Continue current medications.   - POCT HgB A1C - POCT UA - Microalbumin - metFORMIN  (GLUCOPHAGE-XR) 500 MG 24 hr tablet; Take 2 tablets (1,000 mg total) by mouth 2 (two) times daily before a meal.  Dispense: 120 tablet; Refill: 12  2. Vitamin D deficiency Continue current vitamin d replacement - Cholecalciferol (VITAMIN D3) 50000 units CAPS; Take 50,000 Units by mouth every 30 (thirty) days.  Dispense: 12 capsule; Refill: 0  3. Arthralgia of both knees  - DG Knee Complete 4 Views Left; Future - DG Knee Complete 4 Views  Right; Future  4. Essential hypertension Well controlled.  Continue current medications.    5. Gastroesophageal reflux disease, esophagitis presence not specified Continue current dose of omeprazole.    Follow up 4 months.     The entirety of the information documented in the History of Present Illness, Review of Systems and Physical Exam were personally obtained by me. Portions of this information were initially documented by Meyer Cory, CMA and reviewed by me for thoroughness and accuracy.    Lelon Huh, MD  [facility]

## 2015-08-23 ENCOUNTER — Ambulatory Visit
Admission: RE | Admit: 2015-08-23 | Discharge: 2015-08-23 | Disposition: A | Discharge status: Home / Self Care | Payer: Medicare Other | Source: Ambulatory Visit | Attending: Family Medicine | Admitting: Family Medicine

## 2015-08-23 ENCOUNTER — Telehealth: Payer: Self-pay | Admitting: Family Medicine

## 2015-08-23 DIAGNOSIS — M25561 Pain in right knee: Secondary | ICD-10-CM | POA: Diagnosis not present

## 2015-08-23 DIAGNOSIS — M25562 Pain in left knee: Principal | ICD-10-CM

## 2015-08-23 DIAGNOSIS — M170 Bilateral primary osteoarthritis of knee: Secondary | ICD-10-CM | POA: Insufficient documentation

## 2015-08-23 NOTE — Telephone Encounter (Signed)
Pt's wife calling about his test results from yesterday.  Call back number is [phone]  Thanks teri

## 2015-08-24 ENCOUNTER — Telehealth: Payer: Self-pay

## 2015-08-24 DIAGNOSIS — M25561 Pain in right knee: Secondary | ICD-10-CM

## 2015-08-24 DIAGNOSIS — R29898 Other symptoms and signs involving the musculoskeletal system: Secondary | ICD-10-CM

## 2015-08-24 DIAGNOSIS — M25562 Pain in left knee: Principal | ICD-10-CM

## 2015-08-24 NOTE — Telephone Encounter (Signed)
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 Recommend referral to orthopedics for bilateral knee pain and leg weakness.

## 2015-08-24 NOTE — Telephone Encounter (Signed)
Patient wife [name] advised and verbally voiced understanding. She agrees to the Orthopedic referral Please schedule. She states they have a lot going on right now and wont be able to be seen until after 09/19/2015.

## 2015-08-24 NOTE — Telephone Encounter (Signed)
Notified.

## 2015-10-04 ENCOUNTER — Encounter: Payer: Self-pay | Admitting: Family Medicine

## 2015-10-04 ENCOUNTER — Ambulatory Visit (INDEPENDENT_AMBULATORY_CARE_PROVIDER_SITE_OTHER): Payer: Medicare Other | Admitting: Family Medicine

## 2015-10-04 VITALS — BP 118/70 | HR 58 | Temp 97.9°F | Resp 20 | Wt 190.0 lb

## 2015-10-04 DIAGNOSIS — J45909 Unspecified asthma, uncomplicated: Secondary | ICD-10-CM

## 2015-10-04 DIAGNOSIS — J069 Acute upper respiratory infection, unspecified: Secondary | ICD-10-CM

## 2015-10-04 MED ORDER — DM-GUAIFENESIN ER 30-600 MG PO TB12: 1.0000 | ORAL_TABLET | Freq: Two times a day (BID) | ORAL | Status: DC

## 2015-10-04 NOTE — Progress Notes (Signed)
Patient: [name]. Male    DOB: 03-18-1961   55 y.o.   MRN: [id number] Visit Date: 10/04/2015  Today's Provider: Vernie Murders, PA   Chief Complaint  Patient presents with  . Cough    X 1 week.    Subjective:    Cough This is a new problem. The current episode started in the past 7 days. The problem has been unchanged. The cough is non-productive. Associated symptoms include nasal congestion, postnasal drip, a sore throat, shortness of breath and wheezing. He has tried OTC cough suppressant for the symptoms. The treatment provided no relief. His past medical history is significant for environmental allergies.   Past Medical History  Diagnosis Date  . Arrhythmia   . History of chicken pox   . History of measles as a child    Past Surgical History  Procedure Laterality Date  . Ligament removal  of left thumb      Dr. Cleda Mccreedy  . Cardiac catheterization      [facility]  . Ligament removal Left     of left thumb: dr. Cleda Mccreedy  . Cardiac catherization      [facility]   Family History  Problem Relation Age of Onset  . Cancer Mother     Throat cancer  . Diabetes Father    Allergies  Allergen Reactions  . Bee Venom     All kinds of bees  . Bee Venom Hives    All kinds of bees  . Other     Certain powders  . Sulfa Antibiotics     Other reaction(s): Unknown   Current Meds  Medication Sig  . albuterol (PROVENTIL HFA;VENTOLIN HFA) 108 (90 BASE) MCG/ACT inhaler Inhale 2 puffs into the lungs every 6 (six) hours as needed for wheezing.  . Cholecalciferol (VITAMIN D3) 50000 units CAPS Take 50,000 Units by mouth every 30 (thirty) days.  [name] FLOVENT DISKUS 100 MCG/BLIST AEPB   . glipiZIDE (GLUCOTROL XL) 2.5 MG 24 hr tablet Take 1 tablet (2.5 mg total) by mouth every morning.  [name] lisinopril (PRINIVIL,ZESTRIL) 20 MG tablet Take 1 tablet (20 mg total) by mouth daily.  . metFORMIN (GLUCOPHAGE-XR) 500 MG 24 hr tablet Take 2 tablets (1,000 mg total) by mouth 2 (two) times daily  before a meal.  . mometasone-formoterol (DULERA) 200-5 MCG/ACT AERO Inhale 2 puffs into the lungs 2 (two) times daily.  . montelukast (SINGULAIR) 10 MG tablet Take 1 tablet (10 mg total) by mouth at bedtime.  . naproxen (NAPROSYN) 500 MG tablet Take 1 tablet (500 mg total) by mouth 2 (two) times daily with a meal.  . omeprazole (PRILOSEC) 20 MG capsule Take 1 capsule (20 mg total) by mouth daily.  . ONE TOUCH ULTRA TEST test strip TEST once daily  . ONETOUCH DELICA LANCETS FINE MISC TEST once daily  . simvastatin (ZOCOR) 20 MG tablet Take 1 tablet (20 mg total) by mouth daily.  [name] tiZANidine (ZANAFLEX) 4 MG tablet Take 1 tablet (4 mg total) by mouth every 6 (six) hours as needed (headaches).  . triamcinolone cream (KENALOG) 0.5 % apply to affected area twice a day    Review of Systems  HENT: Positive for postnasal drip and sore throat.   Respiratory: Positive for cough, shortness of breath and wheezing.   Allergic/Immunologic: Positive for environmental allergies.    Social History  Substance Use Topics  . Smoking status: Never Smoker   . Smokeless tobacco: Never Used  .  Alcohol Use: No   Objective:   BP 118/70 mmHg  Pulse 58  Temp(Src) 97.9 F (36.6 C)  Resp 20  Wt 190 lb (86.183 kg)  SpO2 97%  Physical Exam  Constitutional: He appears well-developed and well-nourished.  HENT:  Head: Normocephalic.  Right Ear: External ear normal.  Left Ear: External ear normal.  Nose: Nose normal.  Some yellowish post nasal drip with slight cobblestoning.  Eyes: Conjunctivae are normal.  Neck: Neck supple.  Cardiovascular: Normal rate and regular rhythm.   Pulmonary/Chest: Effort normal and breath sounds normal.  Abdominal: Bowel sounds are normal.  Lymphadenopathy:    He has no cervical adenopathy.     Assessment & Plan:     1. Upper respiratory infection Onset over the past week with wife having similar symptoms. No fever. Will give Mucinex-DM to help with PND and cough.  Recheck prn. Increase fluid intake. - dextromethorphan-guaiFENesin (MUCINEX DM) 30-600 MG 12hr tablet; Take 1 tablet by mouth 2 (two) times daily. For cough and congestion.  Dispense: 20 tablet; Refill: 0  2. Asthma, unspecified asthma severity, uncomplicated Occasional wheeze relieved by use of the Albuterol (Pro-Air). Continue Singulair and may use OTC antihistamine prn sneezing or congestion. Recheck prn.        Vernie Murders, PA  [facility]

## 2015-10-11 ENCOUNTER — Other Ambulatory Visit: Payer: Self-pay

## 2015-10-11 DIAGNOSIS — R062 Wheezing: Secondary | ICD-10-CM

## 2015-10-11 MED ORDER — MONTELUKAST SODIUM 10 MG PO TABS: 10.0000 mg | ORAL_TABLET | Freq: Every day | ORAL | 12 refills | Status: DC

## 2015-10-11 NOTE — Telephone Encounter (Signed)
Pharmacy requesting refills. Thanks!

## 2015-11-11 ENCOUNTER — Other Ambulatory Visit: Payer: Self-pay

## 2015-11-11 DIAGNOSIS — Z794 Long term (current) use of insulin: Principal | ICD-10-CM

## 2015-11-11 DIAGNOSIS — E119 Type 2 diabetes mellitus without complications: Secondary | ICD-10-CM

## 2015-11-11 MED ORDER — GLIPIZIDE ER 2.5 MG PO TB24: 2.5000 mg | ORAL_TABLET | ORAL | 12 refills | Status: DC

## 2015-11-11 NOTE — Telephone Encounter (Signed)
Pharmacy faxed refill request.

## 2015-11-14 ENCOUNTER — Telehealth: Payer: Self-pay | Admitting: Family Medicine

## 2015-11-14 NOTE — Telephone Encounter (Signed)
Patient is requesting when (approx) when he went on disability, he is trying to fill out papers for [employer], please advise and let patient know. Thanks

## 2015-11-14 NOTE — Telephone Encounter (Signed)
I don't have record of that.

## 2015-11-15 NOTE — Telephone Encounter (Signed)
Patient was notified.

## 2015-11-22 ENCOUNTER — Ambulatory Visit (INDEPENDENT_AMBULATORY_CARE_PROVIDER_SITE_OTHER): Payer: Medicare Other | Admitting: Family Medicine

## 2015-11-22 ENCOUNTER — Encounter: Payer: Self-pay | Admitting: Family Medicine

## 2015-11-22 VITALS — BP 120/70 | HR 60 | Temp 98.7°F | Resp 16 | Ht 63.0 in | Wt 189.0 lb

## 2015-11-22 DIAGNOSIS — I10 Essential (primary) hypertension: Secondary | ICD-10-CM

## 2015-11-22 DIAGNOSIS — R0602 Shortness of breath: Secondary | ICD-10-CM

## 2015-11-22 DIAGNOSIS — Z794 Long term (current) use of insulin: Secondary | ICD-10-CM | POA: Diagnosis not present

## 2015-11-22 DIAGNOSIS — K219 Gastro-esophageal reflux disease without esophagitis: Secondary | ICD-10-CM | POA: Diagnosis not present

## 2015-11-22 DIAGNOSIS — E119 Type 2 diabetes mellitus without complications: Secondary | ICD-10-CM

## 2015-11-22 LAB — POCT GLYCOSYLATED HEMOGLOBIN (HGB A1C)
Est. average glucose Bld gHb Est-mCnc: 157
Hemoglobin A1C: 7.1

## 2015-11-22 MED ORDER — GLIPIZIDE ER 5 MG PO TB24: 5.0000 mg | ORAL_TABLET | ORAL | 3 refills | Status: DC

## 2015-11-22 NOTE — Patient Instructions (Signed)
It is recommended to engage in 150 minutes of moderate exercise every week.

## 2015-11-22 NOTE — Progress Notes (Signed)
Patient: [name]. Male    DOB: 1960/07/28   55 y.o.   MRN: [id number] Visit Date: 11/22/2015  Today's Provider: Lelon Huh, MD   Chief Complaint  Patient presents with  . Diabetes  . Hypertension  . Gastroesophageal Reflux   Subjective:    HPI   Diabetes Mellitus Type II, Follow-up:   Lab Results  Component Value Date   HGBA1C 7.1 11/22/2015   HGBA1C 7.4 08/22/2015   HGBA1C 7.3 (H) 05/12/2015    Last seen for diabetes 3 months ago.  Management since then includes no changes. He reports excellent compliance with treatment. He is not having side effects.  Current symptoms include none and have been stable. Home blood sugar records: fasting range: 250-300  Episodes of hypoglycemia? no   Current Insulin Regimen: none Most Recent Eye Exam: this year at [facility] Weight trend: stable Prior visit with dietician: no Current diet: in general, a "healthy" diet   Current exercise: yard work  Pertinent Labs:    Component Value Date/Time   CHOL 171 05/12/2015 1133   TRIG 207 (H) 05/12/2015 1133   HDL 39 (L) 05/12/2015 1133   LDLCALC 91 05/12/2015 1133   CREATININE 0.94 05/12/2015 1133    Wt Readings from Last 3 Encounters:  11/22/15 189 lb (85.7 kg)  10/04/15 190 lb (86.2 kg)  08/22/15 195 lb (88.5 kg)    ------------------------------------------------------------------------     Hypertension, follow-up:  BP Readings from Last 3 Encounters:  11/22/15 120/70  10/04/15 118/70  08/22/15 118/66    He was last seen for hypertension 3 months ago.  BP at that visit was 118/66. Management changes since that visit include no changes. He reports excellent compliance with treatment. He is not having side effects.  He is not exercising. He is not adherent to low salt diet.   Outside blood pressures are not being checked. He is experiencing none.  Patient denies chest pain and lower extremity edema.   Cardiovascular risk factors include  diabetes mellitus, hypertension and obesity (BMI >= 30 kg/m2).  Use of agents associated with hypertension: none.     Weight trend: stable Wt Readings from Last 3 Encounters:  11/22/15 189 lb (85.7 kg)  10/04/15 190 lb (86.2 kg)  08/22/15 195 lb (88.5 kg)    Current diet: in general, a "healthy" diet    ------------------------------------------------------------------------   Follow up for GERD  The patient was last seen for this 3 months ago. Changes made at last visit include no changes.  He reports excellent compliance with treatment. He feels that condition is Improved. He is not having side effects.   ------------------------------------------------------------------------------------  Patient also complains of shortness of breath when he walks and wants a prescription for oxygen. He has had this for few year and has extensive evaluation. He had normal spirometry in July of 2016. Had normal Myoview last year. He had normal CBC last year. Admits to being sedentary, but walks his doing briefly a few times a day. Has tried albuterol inhaler which didn't help at all.     Allergies  Allergen Reactions  . Bee Venom     All kinds of bees  . Bee Venom Hives    All kinds of bees  . Other     Certain powders  . Sulfa Antibiotics     Other reaction(s): Unknown     Current Outpatient Prescriptions:  .  albuterol (PROVENTIL HFA;VENTOLIN HFA) 108 (90 BASE) MCG/ACT inhaler, Inhale  2 puffs into the lungs every 6 (six) hours as needed for wheezing., Disp: 1 Inhaler, Rfl: 3 .  Cholecalciferol (VITAMIN D3) 50000 units CAPS, Take 50,000 Units by mouth every 30 (thirty) days., Disp: 12 capsule, Rfl: 0 .  dextromethorphan-guaiFENesin (MUCINEX DM) 30-600 MG 12hr tablet, Take 1 tablet by mouth 2 (two) times daily. For cough and congestion., Disp: 20 tablet, Rfl: 0 .  glipiZIDE (GLUCOTROL XL) 2.5 MG 24 hr tablet, Take 1 tablet (2.5 mg total) by mouth every morning., Disp: 30 tablet, Rfl:  12 .  lisinopril (PRINIVIL,ZESTRIL) 20 MG tablet, Take 1 tablet (20 mg total) by mouth daily., Disp: 30 tablet, Rfl: 6 .  metFORMIN (GLUCOPHAGE-XR) 500 MG 24 hr tablet, Take 2 tablets (1,000 mg total) by mouth 2 (two) times daily before a meal., Disp: 120 tablet, Rfl: 12 .  montelukast (SINGULAIR) 10 MG tablet, Take 1 tablet (10 mg total) by mouth at bedtime., Disp: 30 tablet, Rfl: 12 .  naproxen (NAPROSYN) 500 MG tablet, Take 1 tablet (500 mg total) by mouth 2 (two) times daily with a meal., Disp: 30 tablet, Rfl: 2 .  omeprazole (PRILOSEC) 20 MG capsule, Take 1 capsule (20 mg total) by mouth daily., Disp: 30 capsule, Rfl: 5 .  ONE TOUCH ULTRA TEST test strip, TEST once daily, Disp: 100 each, Rfl: 4 .  ONETOUCH DELICA LANCETS FINE MISC, TEST once daily, Disp: 100 each, Rfl: 3 .  simvastatin (ZOCOR) 20 MG tablet, Take 1 tablet (20 mg total) by mouth daily., Disp: 30 tablet, Rfl: 6 .  tiZANidine (ZANAFLEX) 4 MG tablet, Take 1 tablet (4 mg total) by mouth every 6 (six) hours as needed (headaches)., Disp: 30 tablet, Rfl: 3 .  triamcinolone cream (KENALOG) 0.5 %, apply to affected area twice a day, Disp: 30 g, Rfl: 5  Review of Systems  Constitutional: Positive for fatigue. Negative for chills and diaphoresis.  Respiratory: Positive for shortness of breath. Negative for cough and chest tightness.   Cardiovascular: Negative.  Negative for palpitations and leg swelling.    Social History  Substance Use Topics  . Smoking status: Never Smoker  . Smokeless tobacco: Never Used  . Alcohol use No   Objective:   BP 120/70 (BP Location: Left Arm, Patient Position: Sitting, Cuff Size: Large)   Pulse 60   Temp 98.7 F (37.1 C) (Oral)   Resp 16   Ht 5' 3" (1.6 m)   Wt 189 lb (85.7 kg)   SpO2 97%   BMI 33.48 kg/m   Physical Exam  General Appearance:    Alert, cooperative, no distress, obese  Eyes:    PERRL, conjunctiva/corneas clear, EOM's intact       Lungs:     Clear to auscultation  bilaterally, respirations unlabored  Heart:    Regular rate and rhythm, no murmurs or gallops  Neurologic:   Awake, alert, oriented x 3. No apparent focal neurological           defect.        Results for orders placed or performed in visit on 11/22/15  POCT glycosylated hemoglobin (Hb A1C)  Result Value Ref Range   Hemoglobin A1C 7.1    Est. average glucose Bld gHb Est-mCnc 157        Assessment & Plan:     1. Essential hypertension Well controlled.  Continue current medications.    2. Gastroesophageal reflux disease, esophagitis presence not specified Well controlled.  Continue current medications.    3. Controlled type  2 diabetes mellitus without complication, with long-term current use of insulin (HCC) Home sugars mostly around 200. Long discussion of relationship between blood sugar, HgbA1c and diabetic complications. Will make small increase in glipizide - POCT glycosylated hemoglobin (Hb A1C) - glipiZIDE (GLUCOTROL XL) 5 MG 24 hr tablet; Take 1 tablet (5 mg total) by mouth every morning.  Dispense: 30 tablet; Refill: 3  4. Exertional shortness of breath Has had negative cardiac and pulmonary evaluation which has been normal and no response to albuterol inhalers. This is likely due to poor cardiopulmonary conditioning. Advised to exercise 30 minutes a day at low intensity and gradually increase exercise intensity.   Return in about 4 months (around 03/23/2016).    The entirety of the information documented in the History of Present Illness, Review of Systems and Physical Exam were personally obtained by me. Portions of this information were initially documented by Lynford Humphrey, CMA and reviewed by me for thoroughness and accuracy.    Lelon Huh, MD  [facility]

## 2015-11-27 ENCOUNTER — Emergency Department
Admission: EM | Admit: 2015-11-27 | Discharge: 2015-11-28 | Disposition: A | Discharge status: Home / Self Care | Payer: Medicare Other | Attending: Emergency Medicine | Admitting: Emergency Medicine

## 2015-11-27 ENCOUNTER — Emergency Department: Payer: Medicare Other

## 2015-11-27 DIAGNOSIS — Z7984 Long term (current) use of oral hypoglycemic drugs: Secondary | ICD-10-CM | POA: Insufficient documentation

## 2015-11-27 DIAGNOSIS — R739 Hyperglycemia, unspecified: Secondary | ICD-10-CM

## 2015-11-27 DIAGNOSIS — I10 Essential (primary) hypertension: Secondary | ICD-10-CM | POA: Insufficient documentation

## 2015-11-27 DIAGNOSIS — R1011 Right upper quadrant pain: Secondary | ICD-10-CM

## 2015-11-27 DIAGNOSIS — R42 Dizziness and giddiness: Secondary | ICD-10-CM | POA: Diagnosis present

## 2015-11-27 DIAGNOSIS — E1165 Type 2 diabetes mellitus with hyperglycemia: Secondary | ICD-10-CM | POA: Diagnosis not present

## 2015-11-27 DIAGNOSIS — J45909 Unspecified asthma, uncomplicated: Secondary | ICD-10-CM | POA: Diagnosis not present

## 2015-11-27 DIAGNOSIS — Z79899 Other long term (current) drug therapy: Secondary | ICD-10-CM | POA: Insufficient documentation

## 2015-11-27 DIAGNOSIS — R079 Chest pain, unspecified: Secondary | ICD-10-CM | POA: Diagnosis not present

## 2015-11-27 LAB — BASIC METABOLIC PANEL
ANION GAP: 4 — AB (ref 5–15)
BUN: 11 mg/dL (ref 6–20)
CALCIUM: 9 mg/dL (ref 8.9–10.3)
CO2: 26 mmol/L (ref 22–32)
Chloride: 105 mmol/L (ref 101–111)
Creatinine, Ser: 0.89 mg/dL (ref 0.61–1.24)
Glucose, Bld: 309 mg/dL — ABNORMAL HIGH (ref 65–99)
POTASSIUM: 4.2 mmol/L (ref 3.5–5.1)
Sodium: 135 mmol/L (ref 135–145)

## 2015-11-27 LAB — CBC
HEMATOCRIT: 39.1 % — AB (ref 40.0–52.0)
HEMOGLOBIN: 13.8 g/dL (ref 13.0–18.0)
MCH: 33.1 pg (ref 26.0–34.0)
MCHC: 35.3 g/dL (ref 32.0–36.0)
MCV: 93.9 fL (ref 80.0–100.0)
Platelets: 126 10*3/uL — ABNORMAL LOW (ref 150–440)
RBC: 4.17 MIL/uL — AB (ref 4.40–5.90)
RDW: 12.8 % (ref 11.5–14.5)
WBC: 5.5 10*3/uL (ref 3.8–10.6)

## 2015-11-27 LAB — TROPONIN I

## 2015-11-27 LAB — GLUCOSE, CAPILLARY
GLUCOSE-CAPILLARY: 225 mg/dL — AB (ref 65–99)
GLUCOSE-CAPILLARY: 321 mg/dL — AB (ref 65–99)

## 2015-11-27 NOTE — ED Notes (Signed)
MD at bedside.

## 2015-11-27 NOTE — ED Notes (Signed)
Patient urged to urinate

## 2015-11-27 NOTE — ED Triage Notes (Addendum)
Patient reports blood sugar elevated at home in the 300's, after 30 minutes it was in the 400's.  During triage patient reports right sided chest pain.  States pain increases with movement.  Reports that he fell today.

## 2015-11-28 ENCOUNTER — Emergency Department: Payer: Medicare Other

## 2015-11-28 DIAGNOSIS — R1011 Right upper quadrant pain: Secondary | ICD-10-CM | POA: Diagnosis not present

## 2015-11-28 LAB — HEPATIC FUNCTION PANEL
ALBUMIN: 4.1 g/dL (ref 3.5–5.0)
ALT: 58 U/L (ref 17–63)
AST: 48 U/L — ABNORMAL HIGH (ref 15–41)
Alkaline Phosphatase: 133 U/L — ABNORMAL HIGH (ref 38–126)
BILIRUBIN TOTAL: 0.3 mg/dL (ref 0.3–1.2)
Bilirubin, Direct: <0.1 mg/dL — ABNORMAL LOW (ref 0.1–0.5)
Total Protein: 7.1 g/dL (ref 6.5–8.1)

## 2015-11-28 LAB — URINALYSIS COMPLETE WITH MICROSCOPIC (ARMC ONLY)
BACTERIA UA: NONE SEEN
BILIRUBIN URINE: NEGATIVE
Glucose, UA: >500 mg/dL — AB
HGB URINE DIPSTICK: NEGATIVE
Ketones, ur: NEGATIVE mg/dL
LEUKOCYTES UA: NEGATIVE
Nitrite: NEGATIVE
PH: 7 (ref 5.0–8.0)
PROTEIN: NEGATIVE mg/dL
RBC / HPF: NONE SEEN RBC/hpf (ref 0–5)
Specific Gravity, Urine: 1.005 (ref 1.005–1.030)
Squamous Epithelial / LPF: NONE SEEN

## 2015-11-28 LAB — LIPASE, BLOOD: LIPASE: 39 U/L (ref 11–51)

## 2015-11-28 MED ORDER — FAMOTIDINE 20 MG PO TABS: 20.0000 mg | ORAL_TABLET | Freq: Two times a day (BID) | ORAL | 0 refills | Status: DC

## 2015-11-28 MED ORDER — ONDANSETRON 4 MG PO TBDP
8.0000 mg | ORAL_TABLET | ORAL | Status: AC
  Administered 2015-11-28: 8 mg via ORAL
  Filled 2015-11-28: qty 2

## 2015-11-28 MED ORDER — ONDANSETRON 4 MG PO TBDP: 4.0000 mg | ORAL_TABLET | Freq: Three times a day (TID) | ORAL | 0 refills | Status: DC | PRN

## 2015-11-28 NOTE — ED Notes (Signed)
Patient transported to Ultrasound

## 2015-11-28 NOTE — ED Provider Notes (Signed)
[facility] Emergency Department Provider Note  ____________________________________________  Time seen: Approximately 11:30 PM  I have reviewed the triage vital signs and the nursing notes.   HISTORY  Chief Complaint Hyperglycemia and Chest Pain    HPI Alex Millett. is a 55 y.o. male who complains of elevated blood sugar today. Blood sugar is about 300-400 and usually runs in the 100-200 range.Patient has been eating and drinking normally today but not a lot of fluids. He went out to walk the dog this evening and he got lightheaded and fell down. He did not have syncope. No chest pain shortness of breath abdominal pain or headaches preceding the symptoms. He fell onto his knees but did not sustain any serious injuries that he went back inside. He has been compliant with his glipizide and metformin and his doctor actually recently increase the glipizide. No fevers chills dysuria vomiting cough.     Past Medical History:  Diagnosis Date  . Arrhythmia   . History of chicken pox   . History of measles as a child      Patient Active Problem List   Diagnosis Date Noted  . Vitamin D deficiency 08/22/2015  . Knee pain 08/22/2015  . Carpal tunnel syndrome on left 05/12/2015  . External hemorrhoids 12/20/2014  . Ectatic abdominal aorta ([place]) 10/26/2014  . Allergic rhinitis 10/21/2014  . Diverticulosis 10/21/2014  . Diabetes mellitus type 2, controlled ([place]) 10/21/2014  . Hypertension 10/21/2014  . Hyperlipidemia 10/21/2014  . Pulmonary emphysema ([place]) 10/21/2014  . Asthma 10/21/2014  . GERD (gastroesophageal reflux disease) 10/21/2014  . Steatohepatitis 10/21/2014  . 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 Arthritis 10/21/2014  . Elevation of level of transaminase or lactic acid dehydrogenase (LDH) 10/07/2014  . Fatigue 10/07/2014  . Cardiac arrhythmia 10/07/2014  . Irritable bowel syndrome 10/07/2014  . Pancreatitis 10/07/2014  . Phenylketonuria  (PKU) ([place]) 10/07/2014  . Renal cyst, left 10/07/2014  . Headache 10/07/2014  . Gall bladder polyp 10/01/2013     Past Surgical History:  Procedure Laterality Date  . Cardiac Catherization     [facility]  . CARDIAC CATHETERIZATION     [facility]  . Ligament Removal Left    of left thumb: dr. Cleda Mccreedy  . ligament removal  of left thumb     Dr. Cleda Mccreedy  . NM GATED MYOCARDIAL STUDY (ARMX HX)  06/23/2014   [place]. Normal     Prior to Admission medications   Medication Sig Start Date End Date Taking? Authorizing Provider  albuterol (PROVENTIL HFA;VENTOLIN HFA) 108 (90 BASE) MCG/ACT inhaler Inhale 2 puffs into the lungs every 6 (six) hours as needed for wheezing. 01/31/15  Yes Birdie Sons, MD  Cholecalciferol (VITAMIN D3) 50000 units CAPS Take 50,000 Units by mouth every 30 (thirty) days. 08/22/15  Yes Birdie Sons, MD  dextromethorphan-guaiFENesin [facility] DM) 30-600 MG 12hr tablet Take 1 tablet by mouth 2 (two) times daily. For cough and congestion. 10/04/15  Yes Dennis E Chrismon, PA  glipiZIDE (GLUCOTROL XL) 5 MG 24 hr tablet Take 1 tablet (5 mg total) by mouth every morning. 11/22/15  Yes Birdie Sons, MD  lisinopril (PRINIVIL,ZESTRIL) 20 MG tablet Take 1 tablet (20 mg total) by mouth daily. 08/16/15  Yes Birdie Sons, MD  metFORMIN (GLUCOPHAGE-XR) 500 MG 24 hr tablet Take 2 tablets (1,000 mg total) by mouth 2 (two) times daily before a meal. 08/22/15  Yes Birdie Sons, MD  montelukast (SINGULAIR) 10 MG tablet Take 1 tablet (  10 mg total) by mouth at bedtime. 10/11/15  Yes Birdie Sons, MD  naproxen (NAPROSYN) 500 MG tablet Take 1 tablet (500 mg total) by mouth 2 (two) times daily with a meal. 08/19/15  Yes Birdie Sons, MD  omeprazole (PRILOSEC) 20 MG capsule Take 1 capsule (20 mg total) by mouth daily. 06/07/15  Yes Birdie Sons, MD  simvastatin (ZOCOR) 20 MG tablet Take 1 tablet (20 mg total) by mouth daily. 08/16/15  Yes Birdie Sons, MD  tiZANidine (ZANAFLEX) 4 MG tablet Take 1  tablet (4 mg total) by mouth every 6 (six) hours as needed (headaches). 01/19/15  Yes Birdie Sons, MD  triamcinolone cream (KENALOG) 0.5 % apply to affected area twice a day 12/05/14  Yes Birdie Sons, MD  famotidine (PEPCID) 20 MG tablet Take 1 tablet (20 mg total) by mouth 2 (two) times daily. 11/28/15   Carrie Mew, MD  ondansetron (ZOFRAN ODT) 4 MG disintegrating tablet Take 1 tablet (4 mg total) by mouth every 8 (eight) hours as needed for nausea or vomiting. 11/28/15   Carrie Mew, MD  ONE [facility] ULTRA TEST test strip TEST once daily 06/02/15   Birdie Sons, MD  [facility] DELICA LANCETS FINE MISC TEST once daily 03/15/15   Birdie Sons, MD     Allergies Bee venom; Bee venom; Other; and Sulfa antibiotics   Family History  Problem Relation Age of Onset  . Cancer Mother     Throat cancer  . Diabetes Father     Social History Social History  Substance Use Topics  . Smoking status: Never Smoker  . Smokeless tobacco: Never Used  . Alcohol use No    Review of Systems  Constitutional:   No fever or chills.  ENT:   No sore throat. No rhinorrhea. Cardiovascular:   No chest pain. Respiratory:   No dyspnea or cough. Gastrointestinal:   Negative for abdominal pain, vomiting and diarrhea.  Genitourinary:   Negative for dysuria or difficulty urinating. Musculoskeletal:   Negative for focal pain or swelling Neurological:   Negative for headaches. Positive lightheadedness 10-point ROS otherwise negative.  ____________________________________________   PHYSICAL EXAM:  VITAL SIGNS: ED Triage Vitals  Enc Vitals Group     BP 11/27/15 2123 135/71     Pulse Rate 11/27/15 2123 62     Resp 11/27/15 2123 18     Temp 11/27/15 2123 97.8 F (36.6 C)     Temp Source 11/27/15 2123 Oral     SpO2 11/27/15 2123 97 %     Weight 11/27/15 2124 191 lb (86.6 kg)     Height 11/27/15 2124 5' 3" (1.6 m)     Head Circumference --      Peak Flow --      Pain Score 11/27/15 2128 8      Pain Loc --      Pain Edu? --      Excl. in [place]? --     Vital signs reviewed, nursing assessments reviewed.   Constitutional:   Alert and oriented. Well appearing and in no distress. Eyes:   No scleral icterus. No conjunctival pallor. PERRL. EOMI.  No nystagmus. ENT   Head:   Normocephalic and atraumatic.   Nose:   No congestion/rhinnorhea. No septal hematoma   Mouth/Throat:   MMM, no pharyngeal erythema. No peritonsillar mass.    Neck:   No stridor. No SubQ emphysema. No meningismus. Hematological/Lymphatic/Immunilogical:   No cervical lymphadenopathy. Cardiovascular:  RRR. Symmetric bilateral radial and DP pulses.  No murmurs.  Respiratory:   Normal respiratory effort without tachypnea nor retractions. Breath sounds are clear and equal bilaterally. No wheezes/rales/rhonchi. Gastrointestinal:   Soft Diffuse upper abdominal tenderness. Non distended. There is no CVA tenderness.  No rebound, rigidity, or guarding. Genitourinary:   deferred Musculoskeletal:   Nontender with normal range of motion in all extremities. No joint effusions.  No lower extremity tenderness.  No edema. Neurologic:   Normal speech and language.  CN 2-10 normal. Motor grossly intact. No gross focal neurologic deficits are appreciated.  Skin:    Skin is warm, dry and intact. No rash noted.  No petechiae, purpura, or bullae.  ____________________________________________    LABS (pertinent positives/negatives) (all labs ordered are listed, but only abnormal results are displayed) Labs Reviewed  GLUCOSE, CAPILLARY - Abnormal; Notable for the following:       Result Value   Glucose-Capillary 321 (*)    All other components within normal limits  BASIC METABOLIC PANEL - Abnormal; Notable for the following:    Glucose, Bld 309 (*)    Anion gap 4 (*)    All other components within normal limits  CBC - Abnormal; Notable for the following:    RBC 4.17 (*)    HCT 39.1 (*)    Platelets 126 (*)     All other components within normal limits  URINALYSIS COMPLETEWITH MICROSCOPIC ([facility] ONLY) - Abnormal; Notable for the following:    Color, Urine STRAW (*)    APPearance CLEAR (*)    Glucose, UA >500 (*)    All other components within normal limits  HEPATIC FUNCTION PANEL - Abnormal; Notable for the following:    AST 48 (*)    Alkaline Phosphatase 133 (*)    Bilirubin, Direct <0.1 (*)    All other components within normal limits  GLUCOSE, CAPILLARY - Abnormal; Notable for the following:    Glucose-Capillary 225 (*)    All other components within normal limits  TROPONIN I  LIPASE, BLOOD  CBG MONITORING, ED   ____________________________________________   EKG  Interpreted by me Sinus bradycardia rate 59, left axis, normal intervals. Poor R-wave progression in anterior precordial leads. Normal ST segments and T waves.  ____________________________________________    RADIOLOGY  Chest x-ray unremarkable Ultrasound right upper quadrant unremarkable, shows 2 gallbladder polyps.  ____________________________________________   PROCEDURES Procedures  ____________________________________________   INITIAL IMPRESSION / ASSESSMENT AND PLAN / ED COURSE  Pertinent labs & imaging results that were available during my care of the patient were reviewed by me and considered in my medical decision making (see chart for details).  Patient well appearing no acute distress. Presents with hyperglycemia. Doubt significantly dehydrated. No evidence of acidosis. No evidence of any precipitating infection or other acute pathology. I performed an ultrasound of the right upper quadrant due to his diabetes and concern for occult biliary disease. This was essentially unremarkable except for bilateral bladder polyps. Patient should follow up with primary care. Encouraged him to continue drinking lots of water and track his blood sugars.Considering the patient's symptoms, medical history, and  physical examination today, I have low suspicion for cholecystitis or biliary pathology, pancreatitis, perforation or bowel obstruction, hernia, intra-abdominal abscess, AAA or dissection, volvulus or intussusception, mesenteric ischemia, or appendicitis.       Clinical Course   ____________________________________________   FINAL CLINICAL IMPRESSION(S) / ED DIAGNOSES  Final diagnoses:  Hyperglycemia  RUQ pain       Portions of  this note were generated with dragon dictation software. Dictation errors may occur despite best attempts at proofreading.    Carrie Mew, MD 11/28/15 [phone]

## 2015-11-28 NOTE — ED Notes (Signed)
Pt back to bed; side rails up x 2 with callbell in reach; warm blanket for comfort; specimen sent to lab; understands waiting for results and MD followup

## 2015-11-30 ENCOUNTER — Ambulatory Visit (INDEPENDENT_AMBULATORY_CARE_PROVIDER_SITE_OTHER): Payer: Medicare Other | Admitting: Family Medicine

## 2015-11-30 ENCOUNTER — Encounter: Payer: Self-pay | Admitting: Family Medicine

## 2015-11-30 VITALS — BP 118/70 | HR 62 | Temp 97.6°F | Resp 16 | Wt 185.0 lb

## 2015-11-30 DIAGNOSIS — Z794 Long term (current) use of insulin: Secondary | ICD-10-CM

## 2015-11-30 DIAGNOSIS — G5602 Carpal tunnel syndrome, left upper limb: Secondary | ICD-10-CM

## 2015-11-30 DIAGNOSIS — E119 Type 2 diabetes mellitus without complications: Secondary | ICD-10-CM | POA: Diagnosis not present

## 2015-11-30 NOTE — Progress Notes (Signed)
Patient: [name]. Male    DOB: 1960/09/26   55 y.o.   MRN: [id number] Visit Date: 11/30/2015  Today's Provider: Lelon Huh, MD   Chief Complaint  Patient presents with  . Follow-up   Subjective:    HPI  Follow up ER visit  Patient reports that on Sunday 11/27/15 he was feeling lightheaded and feel while walking his dog. Patient reports that his wife checked is blood sugar and was in the upper 300's. EMS rechecked and reported 422. Patient was taken via ambulance to [facility] ER. Patient was seen in ER for elevated blood sugar on 11/27/15. He was treated for hyperglycemia and RUQ pain. Treatment for this included [name] . He reports excellent compliance with treatment. He reports this condition is Improved.  He states he had been drinking several glasses of sweet tea and coca cola every day before office visit. Since then has stopped drinking sweetened beverages and is feeling much better, sugars are staying in the low to mid 100s.   ------------------------------------------------------------------------------------       Allergies  Allergen Reactions  . Bee Venom     All kinds of bees  . Bee Venom Hives    All kinds of bees  . Other     Certain powders  . Sulfa Antibiotics     Other reaction(s): Unknown     Current Outpatient Prescriptions:  .  albuterol (PROVENTIL HFA;VENTOLIN HFA) 108 (90 BASE) MCG/ACT inhaler, Inhale 2 puffs into the lungs every 6 (six) hours as needed for wheezing., Disp: 1 Inhaler, Rfl: 3 .  Cholecalciferol (VITAMIN D3) 50000 units CAPS, Take 50,000 Units by mouth every 30 (thirty) days., Disp: 12 capsule, Rfl: 0 .  dextromethorphan-guaiFENesin (MUCINEX DM) 30-600 MG 12hr tablet, Take 1 tablet by mouth 2 (two) times daily. For cough and congestion., Disp: 20 tablet, Rfl: 0 .  famotidine (PEPCID) 20 MG tablet, Take 1 tablet (20 mg total) by mouth 2 (two) times daily., Disp: 60 tablet, Rfl: 0 .  glipiZIDE (GLUCOTROL XL) 5 MG 24 hr  tablet, Take 1 tablet (5 mg total) by mouth every morning., Disp: 30 tablet, Rfl: 3 .  lisinopril (PRINIVIL,ZESTRIL) 20 MG tablet, Take 1 tablet (20 mg total) by mouth daily., Disp: 30 tablet, Rfl: 6 .  metFORMIN (GLUCOPHAGE-XR) 500 MG 24 hr tablet, Take 2 tablets (1,000 mg total) by mouth 2 (two) times daily before a meal., Disp: 120 tablet, Rfl: 12 .  montelukast (SINGULAIR) 10 MG tablet, Take 1 tablet (10 mg total) by mouth at bedtime., Disp: 30 tablet, Rfl: 12 .  naproxen (NAPROSYN) 500 MG tablet, Take 1 tablet (500 mg total) by mouth 2 (two) times daily with a meal., Disp: 30 tablet, Rfl: 2 .  omeprazole (PRILOSEC) 20 MG capsule, Take 1 capsule (20 mg total) by mouth daily., Disp: 30 capsule, Rfl: 5 .  ondansetron (ZOFRAN ODT) 4 MG disintegrating tablet, Take 1 tablet (4 mg total) by mouth every 8 (eight) hours as needed for nausea or vomiting., Disp: 20 tablet, Rfl: 0 .  ONE TOUCH ULTRA TEST test strip, TEST once daily, Disp: 100 each, Rfl: 4 .  ONETOUCH DELICA LANCETS FINE MISC, TEST once daily, Disp: 100 each, Rfl: 3 .  simvastatin (ZOCOR) 20 MG tablet, Take 1 tablet (20 mg total) by mouth daily., Disp: 30 tablet, Rfl: 6 .  tiZANidine (ZANAFLEX) 4 MG tablet, Take 1 tablet (4 mg total) by mouth every 6 (six) hours as needed (headaches)., Disp:  30 tablet, Rfl: 3 .  triamcinolone cream (KENALOG) 0.5 %, apply to affected area twice a day, Disp: 30 g, Rfl: 5  Review of Systems  Constitutional: Negative.  Negative for chills, diaphoresis and fever.  Cardiovascular: Negative.   Gastrointestinal: Negative for abdominal distention, abdominal pain and nausea.  Endocrine: Negative.     Social History  Substance Use Topics  . Smoking status: Never Smoker  . Smokeless tobacco: Never Used  . Alcohol use No   Objective:   BP 118/70 (BP Location: Left Arm, Patient Position: Sitting, Cuff Size: Large)   Pulse 62   Temp 97.6 F (36.4 C) (Oral)   Resp 16   Wt 185 lb (83.9 kg)   SpO2 98%   BMI  32.77 kg/m   Physical Exam  General Appearance:    Alert, cooperative, no distress, obese  Eyes:    PERRL, conjunctiva/corneas clear, EOM's intact       Lungs:     Clear to auscultation bilaterally, respirations unlabored  Heart:    Regular rate and rhythm  Neurologic:   Awake, alert, oriented x 3. No apparent focal neurological           defect.           Assessment & Plan:     1. Controlled type 2 diabetes mellitus without complication, with long-term current use of insulin ([place]) Doing better since eliminating sweetened beverages.   2. Carpal tunnel syndrome on left He continues to have persistent pain volar aspect of left forearm and wrist. He had appointment scheduled earlier this year for CTS, but he cancelled due to not being able to make co-pay. He has failed conservative measures and advised that he really needs to see orthopedist for this. Given number to call [facility] orthopedics to reschedule appointment.   The entirety of the information documented in the History of Present Illness, Review of Systems and Physical Exam were personally obtained by me. Portions of this information were initially documented by Lynford Humphrey, CMA and reviewed by me for thoroughness and accuracy.   Follow up diabetes as scheduled in January.        Lelon Huh, MD  [facility]

## 2015-11-30 NOTE — Patient Instructions (Signed)
Call [place] to schedule evaluation of carpal tunnel syndrome [phone]

## 2015-12-08 ENCOUNTER — Other Ambulatory Visit: Payer: Self-pay | Admitting: Family Medicine

## 2015-12-22 ENCOUNTER — Other Ambulatory Visit: Payer: Self-pay | Admitting: Family Medicine

## 2016-02-03 ENCOUNTER — Telehealth: Payer: Self-pay | Admitting: Family Medicine

## 2016-02-03 NOTE — Telephone Encounter (Signed)
Called Pt to schedule AWV with [facility] on 1/5 @ 10am - knb

## 2016-03-09 ENCOUNTER — Other Ambulatory Visit: Payer: Self-pay | Admitting: Family Medicine

## 2016-03-21 ENCOUNTER — Other Ambulatory Visit: Payer: Self-pay | Admitting: Family Medicine

## 2016-03-23 ENCOUNTER — Ambulatory Visit: Payer: Self-pay

## 2016-03-23 ENCOUNTER — Ambulatory Visit: Payer: Self-pay | Admitting: Family Medicine

## 2016-03-30 ENCOUNTER — Telehealth: Payer: Self-pay | Admitting: Family Medicine

## 2016-03-30 NOTE — Telephone Encounter (Signed)
Patient's wife [name] was notified. Ex[pressed understanding.

## 2016-03-30 NOTE — Telephone Encounter (Signed)
Please advise?

## 2016-03-30 NOTE — Telephone Encounter (Signed)
Its ok to wait until Monday to get more strips.

## 2016-03-30 NOTE — Telephone Encounter (Signed)
Pt states he is out of his test strips.  The pharmacy told the insurance will not cover a new Rx until Monday.  CB#[phone]/MW

## 2016-03-30 NOTE — Telephone Encounter (Signed)
Pt called back again asking about test strips.  He only has one left.  Please advise.  Con Memos

## 2016-04-12 ENCOUNTER — Telehealth: Payer: Self-pay | Admitting: Family Medicine

## 2016-04-12 NOTE — Telephone Encounter (Signed)
Pt had both Flu and Pneumonia shot on September 9th at [facility] in [place]

## 2016-04-12 NOTE — Telephone Encounter (Signed)
Vaccines documented.

## 2016-04-22 ENCOUNTER — Other Ambulatory Visit: Payer: Self-pay | Admitting: Family Medicine

## 2016-05-15 ENCOUNTER — Ambulatory Visit (INDEPENDENT_AMBULATORY_CARE_PROVIDER_SITE_OTHER): Payer: Medicare Other | Admitting: Family Medicine

## 2016-05-15 ENCOUNTER — Ambulatory Visit (INDEPENDENT_AMBULATORY_CARE_PROVIDER_SITE_OTHER): Payer: Medicare Other

## 2016-05-15 VITALS — BP 124/64 | HR 60 | Temp 97.7°F | Ht 63.0 in | Wt 185.2 lb

## 2016-05-15 DIAGNOSIS — J4520 Mild intermittent asthma, uncomplicated: Secondary | ICD-10-CM

## 2016-05-15 DIAGNOSIS — K760 Fatty (change of) liver, not elsewhere classified: Secondary | ICD-10-CM | POA: Diagnosis not present

## 2016-05-15 DIAGNOSIS — G5602 Carpal tunnel syndrome, left upper limb: Secondary | ICD-10-CM

## 2016-05-15 DIAGNOSIS — R296 Repeated falls: Secondary | ICD-10-CM | POA: Diagnosis not present

## 2016-05-15 DIAGNOSIS — E559 Vitamin D deficiency, unspecified: Secondary | ICD-10-CM | POA: Diagnosis not present

## 2016-05-15 DIAGNOSIS — E119 Type 2 diabetes mellitus without complications: Secondary | ICD-10-CM | POA: Diagnosis not present

## 2016-05-15 DIAGNOSIS — Z1211 Encounter for screening for malignant neoplasm of colon: Secondary | ICD-10-CM | POA: Diagnosis not present

## 2016-05-15 DIAGNOSIS — E785 Hyperlipidemia, unspecified: Secondary | ICD-10-CM | POA: Diagnosis not present

## 2016-05-15 DIAGNOSIS — Z125 Encounter for screening for malignant neoplasm of prostate: Secondary | ICD-10-CM | POA: Diagnosis not present

## 2016-05-15 DIAGNOSIS — Z794 Long term (current) use of insulin: Secondary | ICD-10-CM

## 2016-05-15 DIAGNOSIS — M159 Polyosteoarthritis, unspecified: Secondary | ICD-10-CM

## 2016-05-15 DIAGNOSIS — Z114 Encounter for screening for human immunodeficiency virus [HIV]: Secondary | ICD-10-CM | POA: Diagnosis not present

## 2016-05-15 DIAGNOSIS — I10 Essential (primary) hypertension: Secondary | ICD-10-CM

## 2016-05-15 DIAGNOSIS — M1990 Unspecified osteoarthritis, unspecified site: Secondary | ICD-10-CM

## 2016-05-15 DIAGNOSIS — Z0000 Encounter for general adult medical examination without abnormal findings: Secondary | ICD-10-CM | POA: Diagnosis not present

## 2016-05-15 DIAGNOSIS — K219 Gastro-esophageal reflux disease without esophagitis: Secondary | ICD-10-CM | POA: Diagnosis not present

## 2016-05-15 LAB — POCT GLYCOSYLATED HEMOGLOBIN (HGB A1C)
ESTIMATED AVERAGE GLUCOSE: 143
HEMOGLOBIN A1C: 6.6

## 2016-05-15 NOTE — Progress Notes (Signed)
Patient: [name]., Male    DOB: 16-May-1960, 56 y.o.   MRN: [id number] Visit Date: 05/15/2016  Today's Provider: Lelon Huh, MD   Chief Complaint  Patient presents with  . Annual Exam  . Hypertension    follow up  . Gastroesophageal Reflux    follow up  . Diabetes    follow up   Subjective:   Patient presents for follow up of multiple chronic medical problems. Was seen by [facility] for AWV earlier today.   -----------------------------------------------------------------  Hypertension, follow-up:  BP Readings from Last 3 Encounters:  05/15/16 124/64  11/30/15 118/70  11/28/15 132/79    He was last seen for hypertension 5 months ago.  BP at that visit was 120/70. Management since that visit includes no changes. He reports good compliance with treatment. He is not having side effects.  He is exercising. He is adherent to low salt diet.   Outside blood pressures are not being checked. He is experiencing chest pain, dyspnea and lower extremity edema.  Patient denies chest pressure/discomfort, orthopnea, palpitations, syncope and tachypnea.   Cardiovascular risk factors include diabetes mellitus, hypertension, male gender and obesity (BMI >= 30 kg/m2).  Use of agents associated with hypertension: none.     Weight trend: stable Wt Readings from Last 3 Encounters:  05/15/16 185 lb 3.2 oz (84 kg)  11/30/15 185 lb (83.9 kg)  11/27/15 191 lb (86.6 kg)    Current diet: well balanced  ------------------------------------------------------------------------  Diabetes Mellitus Type II, Follow-up:   Lab Results  Component Value Date   HGBA1C 7.1 11/22/2015   HGBA1C 7.4 08/22/2015   HGBA1C 7.3 (H) 05/12/2015    Last seen for diabetes 5 months ago.  Management since then includes no changes. He reports good compliance with treatment. He is not having side effects.  Current symptoms include nausea and paresthesia of the feet and have been  stable. Home blood sugar records: fasting range: 199  Episodes of hypoglycemia? no   Current Insulin Regimen: none Most Recent Eye Exam: 11 months ago Weight trend: stable Prior visit with dietician: no Current diet: well balanced Current exercise: exercise equipment  Pertinent Labs:    Component Value Date/Time   CHOL 171 05/12/2015 1133   TRIG 207 (H) 05/12/2015 1133   HDL 39 (L) 05/12/2015 1133   LDLCALC 91 05/12/2015 1133   CREATININE 0.89 11/27/2015 2139    Wt Readings from Last 3 Encounters:  05/15/16 185 lb 3.2 oz (84 kg)  11/30/15 185 lb (83.9 kg)  11/27/15 191 lb (86.6 kg)    ------------------------------------------------------------------------ Follow up of GERD:  Patient was last seen for this problem 5 months ago and no changes were made. He is taking famotidine and omeprazole which he is tolerating well and is controlling all GERD symptoms.   Ran out of vitamin d a few months ago. Denies any adverse effects  Joint pain/; Complains of persistent pain in joints of knees, hips, wrists, elbows, and shoulders. His right knee is worse than left. He wants a knee brace. He also states he falls frequently due to pain and weakness of his knee.   He also continues to have pain in his left wrist and forearm with numbness and tingling in his left hand. He was referred to orthopedist a few years and states he was told to try physical therapy before entertaining surgery.     Review of Systems  Constitutional: Positive for appetite change, chills and unexpected weight  change. Negative for fatigue and fever.  HENT: Positive for sinus pressure and tinnitus. Negative for congestion, ear pain, hearing loss, nosebleeds and trouble swallowing.   Eyes: Positive for photophobia, pain and redness. Negative for visual disturbance.  Respiratory: Positive for chest tightness, shortness of breath and wheezing. Negative for cough.   Cardiovascular: Positive for chest pain and leg  swelling. Negative for palpitations.  Gastrointestinal: Positive for abdominal pain, diarrhea and nausea. Negative for blood in stool, constipation and vomiting.  Endocrine: Positive for heat intolerance and polydipsia. Negative for polyphagia and polyuria.  Genitourinary: Positive for dysuria and frequency. Negative for flank pain.  Musculoskeletal: Positive for arthralgias. Negative for back pain, joint swelling, myalgias and neck stiffness.  Skin: Positive for rash. Negative for color change and wound.  Neurological: Positive for dizziness, speech difficulty, weakness, light-headedness, numbness and headaches. Negative for tremors and seizures.  Psychiatric/Behavioral: Positive for confusion and decreased concentration. Negative for behavioral problems, dysphoric mood and sleep disturbance. The patient is not nervous/anxious.   All other systems reviewed and are negative.   Social History      He  reports that he has never smoked. He has never used smokeless tobacco. He reports that he does not drink alcohol or use drugs.       Social History   Social History  . Marital status: Married    Spouse name: N/A  . Number of children: N/A  . Years of education: N/A   Occupational History  . Disabled    Social History Main Topics  . Smoking status: Never Smoker  . Smokeless tobacco: Never Used  . Alcohol use No  . Drug use: No  . Sexual activity: Not on file   Other Topics Concern  . Not on file   Social History Narrative   ** Merged History Encounter **       ** Data from: 10/22/14 Enc Dept: [facility]       ** Data from: 10/07/14 Enc Dept: [facility]   Arrest: Arrested in 2012 for indecent liberties, required to wear ankle monitor    Past Medical History:  Diagnosis Date  . Arrhythmia   . Cyst of kidney, acquired   . Diabetes mellitus without complication ([place])    type 2  . History of chicken pox   . History of measles as a child   . Hyperlipidemia    . Hypertension   . Irregular heart beat      Patient Active Problem List   Diagnosis Date Noted  . Vitamin D deficiency 08/22/2015  . Knee pain 08/22/2015  . Carpal tunnel syndrome on left 05/12/2015  . External hemorrhoids 12/20/2014  . Ectatic abdominal aorta ([place]) 10/26/2014  . Allergic rhinitis 10/21/2014  . Diverticulosis 10/21/2014  . Diabetes mellitus type 2, controlled ([place]) 10/21/2014  . Hypertension 10/21/2014  . Hyperlipidemia 10/21/2014  . Pulmonary emphysema ([place]) 10/21/2014  . Asthma 10/21/2014  . GERD (gastroesophageal reflux disease) 10/21/2014  . NAFLD (nonalcoholic fatty liver disease) 10/21/2014  . 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 Arthritis 10/21/2014  . Elevation of level of transaminase or lactic acid dehydrogenase (LDH) 10/07/2014  . Fatigue 10/07/2014  . Cardiac arrhythmia 10/07/2014  . Irritable bowel syndrome 10/07/2014  . Pancreatitis 10/07/2014  . Phenylketonuria (PKU) ([place]) 10/07/2014  . Renal cyst, left 10/07/2014  . Headache 10/07/2014  . Gall bladder polyp 10/01/2013    Past Surgical History:  Procedure Laterality Date  . Cardiac Catherization     [facility]  .  CARDIAC CATHETERIZATION     [facility]  . Ligament Removal Left    of left thumb: dr. Cleda Mccreedy  . ligament removal  of left thumb     Dr. Cleda Mccreedy  . NM GATED MYOCARDIAL STUDY (ARMX HX)  06/23/2014   Paraschos. Normal    Family History        Family Status  Relation Status  . Mother Alive  . Father Alive  . Maternal Grandmother Deceased  . Maternal Grandfather Deceased  . Paternal Grandfather Deceased        His family history includes Cancer in his maternal grandfather, maternal grandmother, mother, and paternal grandfather; Diabetes in his father; Heart disease in his father.     Allergies  Allergen Reactions  . Bee Venom     All kinds of bees  . Bee Venom Hives    All kinds of bees  . Other     Certain powders  . Sulfa Antibiotics     Other reaction(s): Unknown      Current Outpatient Prescriptions:  .  albuterol (PROVENTIL HFA;VENTOLIN HFA) 108 (90 BASE) MCG/ACT inhaler, Inhale 2 puffs into the lungs every 6 (six) hours as needed for wheezing., Disp: 1 Inhaler, Rfl: 3 .  famotidine (PEPCID) 20 MG tablet, Take 1 tablet (20 mg total) by mouth 2 (two) times daily., Disp: 60 tablet, Rfl: 0 .  FLOVENT DISKUS 100 MCG/BLIST AEPB, inhale 1 puff by mouth twice a day Rinse mouth after use, Disp: 60 each, Rfl: 5 .  glipiZIDE (GLUCOTROL XL) 2.5 MG 24 hr tablet, Take 2.5 mg by mouth daily., Disp: , Rfl:  .  lisinopril (PRINIVIL,ZESTRIL) 20 MG tablet, take 1 tablet by mouth once daily, Disp: 30 tablet, Rfl: 6 .  metFORMIN (GLUCOPHAGE-XR) 500 MG 24 hr tablet, Take 2 tablets (1,000 mg total) by mouth 2 (two) times daily before a meal., Disp: 120 tablet, Rfl: 12 .  montelukast (SINGULAIR) 10 MG tablet, Take 1 tablet (10 mg total) by mouth at bedtime., Disp: 30 tablet, Rfl: 12 .  naproxen (NAPROSYN) 500 MG tablet, Take 1 tablet (500 mg total) by mouth 2 (two) times daily with a meal., Disp: 30 tablet, Rfl: 2 .  omeprazole (PRILOSEC) 20 MG capsule, take 1 capsule by mouth once daily, Disp: 30 capsule, Rfl: 5 .  ONE TOUCH ULTRA TEST test strip, TEST once daily, Disp: 100 each, Rfl: 4 .  ONETOUCH DELICA LANCETS FINE MISC, TEST once daily, Disp: 100 each, Rfl: 3 .  simvastatin (ZOCOR) 20 MG tablet, take 1 tablet by mouth once daily, Disp: 30 tablet, Rfl: 6 .  tiZANidine (ZANAFLEX) 4 MG capsule, Take 4 mg by mouth every 6 (six) hours as needed (headache). , Disp: , Rfl:  .  Cholecalciferol (VITAMIN D3) 50000 units CAPS, Take 50,000 Units by mouth every 30 (thirty) days. (Patient not taking: Reported on 05/15/2016), Disp: 12 capsule, Rfl: 0 .  ondansetron (ZOFRAN ODT) 4 MG disintegrating tablet, Take 1 tablet (4 mg total) by mouth every 8 (eight) hours as needed for nausea or vomiting. (Patient not taking: Reported on 05/15/2016), Disp: 20 tablet, Rfl: 0 .  Propylene Glycol  (SYSTANE BALANCE) 0.6 % SOLN, Apply to eye., Disp: , Rfl:  .  triamcinolone cream (KENALOG) 0.5 %, apply to affected area twice a day, Disp: 30 g, Rfl: 5   Patient Care Team: Birdie Sons, MD as PCP - General (Family Medicine) D Orion Modest, OD (Optometry)      Objective:   Vital Signs -  Last Recorded  Most recent update: 05/15/2016 1:09 PM by Fabio Neighbors, LPN  BP  027/74 (BP Location: Right Arm)     Pulse  60     Temp  97.7 F (36.5 C) (Oral)     Ht  5' 3" (1.6 m)     Wt  185 lb 3.2 oz (84 kg)      BMI  32.81 kg/m       Physical Exam  General Appearance:    Alert, cooperative, no distress, obese  Eyes:    PERRL, conjunctiva/corneas clear, EOM's intact       Lungs:     Clear to auscultation bilaterally, respirations unlabored  Heart:    Regular rate and rhythm  Neurologic:   Awake, alert, oriented x 3. No apparent focal neurological           defect.        Depression Screen PHQ 2/9 Scores 05/15/2016 05/15/2016 05/12/2015  PHQ - 2 Score 3 1 0  PHQ- 9 Score 12 - -    Results for orders placed or performed in visit on 05/15/16  POCT HgB A1C  Result Value Ref Range   Hemoglobin A1C 6.6    Est. average glucose Bld gHb Est-mCnc 143      Assessment & Plan:     Exercise Activities and Dietary recommendations Goals    . Increase water intake          Starting 05/15/16, I will start drinking 2-3 glasses of water a day.       Immunization History  Administered Date(s) Administered  . Influenza-Unspecified 11/30/2014, 11/18/2015  . Pneumococcal Polysaccharide-23 12/22/2012, 11/18/2015    Health Maintenance  Topic Date Due  . HIV Screening  02/09/1978  . TETANUS/TDAP  02/09/1982  . COLONOSCOPY  02/09/2013  . FOOT EXAM  03/19/2026 (Originally 02/09/1973)  . HEMOGLOBIN A1C  05/21/2016  . OPHTHALMOLOGY EXAM  06/21/2016  . PNEUMOCOCCAL POLYSACCHARIDE VACCINE (2) 12/22/2017  . INFLUENZA VACCINE  Completed  . Hepatitis C Screening  Completed      Discussed health benefits of physical activity, and encouraged him to engage in regular exercise appropriate for his age and condition.    --------------------------------------------------------------------  1. Controlled type 2 diabetes mellitus without complication, with long-term current use of insulin ([place]) Well controlled.  Continue current medications.   - POCT HgB A1C  2. Essential hypertension Well controlled.  Continue current medications.   - Renal function panel  3. Asthma Doing well on fluticasone  4. Gastroesophageal reflux disease, esophagitis presence not specified Well controlled.  Continue current medications.    5. NAFLD (nonalcoholic fatty liver disease) Check transaminases today  6. Hyperlipidemia, unspecified hyperlipidemia type . - Comprehensive metabolic panel - Lipid panel  7. Vitamin D deficiency Has been out of vitamin d for a few months. Check levels.  - VITAMIN D 25 Hydroxy (Vit-D Deficiency, Fractures)  8. Osteoarthritis of multiple joints, unspecified osteoarthritis type   9. Arthritis  - Ambulatory referral to Physical Therapy  10. Frequent falls  - Ambulatory referral to Physical Therapy  11. Colon cancer screening  - Cologuard  12. Prostate cancer screening  - PSA  13. Carpal tunnel syndrome on left  - Ambulatory referral to Occupational Therapy   Lelon Huh, MD  [facility]

## 2016-05-15 NOTE — Progress Notes (Signed)
Subjective:   [name]. is a 56 y.o. male who presents for Medicare Annual/Subsequent preventive examination.  Review of Systems:  N/A  Cardiac Risk Factors include: male gender;diabetes mellitus;obesity (BMI >30kg/m2);hypertension;dyslipidemia     Objective:    Vitals: BP 124/64 (BP Location: Right Arm)   Pulse 60   Temp 97.7 F (36.5 C) (Oral)   Ht 5' 3" (1.6 m)   Wt 185 lb 3.2 oz (84 kg)   BMI 32.81 kg/m   Body mass index is 32.81 kg/m.  Tobacco History  Smoking Status  . Never Smoker  Smokeless Tobacco  . Never Used     Counseling given: Not Answered   Past Medical History:  Diagnosis Date  . Arrhythmia   . Cyst of kidney, acquired   . Diabetes mellitus without complication ([place])    type 2  . History of chicken pox   . History of measles as a child   . Hyperlipidemia   . Hypertension   . Irregular heart beat    Past Surgical History:  Procedure Laterality Date  . Cardiac Catherization     [facility]  . CARDIAC CATHETERIZATION     [facility]  . Ligament Removal Left    of left thumb: dr. Cleda Mccreedy  . ligament removal  of left thumb     Dr. Cleda Mccreedy  . NM GATED MYOCARDIAL STUDY (ARMX HX)  06/23/2014   Paraschos. Normal   Family History  Problem Relation Age of Onset  . Cancer Mother     throat  . Diabetes Father   . Heart disease Father   . Cancer Maternal Grandmother   . Cancer Maternal Grandfather     pancreatic  . Cancer Paternal Grandfather    History  Sexual Activity  . Sexual activity: Not on file    Outpatient Encounter Prescriptions as of 05/15/2016  Medication Sig  . albuterol (PROVENTIL HFA;VENTOLIN HFA) 108 (90 BASE) MCG/ACT inhaler Inhale 2 puffs into the lungs every 6 (six) hours as needed for wheezing.  . famotidine (PEPCID) 20 MG tablet Take 1 tablet (20 mg total) by mouth 2 (two) times daily.  [name] FLOVENT DISKUS 100 MCG/BLIST AEPB inhale 1 puff by mouth twice a day Rinse mouth after use  . glipiZIDE (GLUCOTROL XL) 2.5 MG 24 hr  tablet Take 2.5 mg by mouth daily with breakfast.  . lisinopril (PRINIVIL,ZESTRIL) 20 MG tablet take 1 tablet by mouth once daily  . metFORMIN (GLUCOPHAGE-XR) 500 MG 24 hr tablet Take 2 tablets (1,000 mg total) by mouth 2 (two) times daily before a meal.  . montelukast (SINGULAIR) 10 MG tablet Take 1 tablet (10 mg total) by mouth at bedtime.  . naproxen (NAPROSYN) 500 MG tablet Take 1 tablet (500 mg total) by mouth 2 (two) times daily with a meal.  . omeprazole (PRILOSEC) 20 MG capsule take 1 capsule by mouth once daily  . ONE TOUCH ULTRA TEST test strip TEST once daily  . ONETOUCH DELICA LANCETS FINE MISC TEST once daily  . Propylene Glycol (SYSTANE BALANCE) 0.6 % SOLN Apply to eye.  . simvastatin (ZOCOR) 20 MG tablet take 1 tablet by mouth once daily  . tiZANidine (ZANAFLEX) 4 MG tablet Take 1 tablet (4 mg total) by mouth every 6 (six) hours as needed (headaches).  . triamcinolone cream (KENALOG) 0.5 % apply to affected area twice a day  . Cholecalciferol (VITAMIN D3) 50000 units CAPS Take 50,000 Units by mouth every 30 (thirty) days. (Patient not taking: Reported  on 05/15/2016)  . dextromethorphan-guaiFENesin (MUCINEX DM) 30-600 MG 12hr tablet Take 1 tablet by mouth 2 (two) times daily. For cough and congestion. (Patient not taking: Reported on 05/15/2016)  . glipiZIDE (GLUCOTROL XL) 5 MG 24 hr tablet Take 1 tablet (5 mg total) by mouth every morning. (Patient not taking: Reported on 05/15/2016)  . ondansetron (ZOFRAN ODT) 4 MG disintegrating tablet Take 1 tablet (4 mg total) by mouth every 8 (eight) hours as needed for nausea or vomiting. (Patient not taking: Reported on 05/15/2016)   No facility-administered encounter medications on file as of 05/15/2016.     Activities of Daily Living In your present state of health, do you have any difficulty performing the following activities: 05/15/2016  Hearing? Y  Vision? N  Difficulty concentrating or making decisions? Y  Walking or climbing stairs? Y   Dressing or bathing? N  Doing errands, shopping? N  Preparing Food and eating ? N  Using the Toilet? N  In the past six months, have you accidently leaked urine? N  Do you have problems with loss of bowel control? Y  Managing your Medications? Y  Managing your Finances? Y  Housekeeping or managing your Housekeeping? N  Some recent data might be hidden    Patient Care Team: Birdie Sons, MD as PCP - General (Family Medicine) D Orion Modest, OD (Optometry)   Assessment:     Exercise Activities and Dietary recommendations Current Exercise Habits: Home exercise routine, Type of exercise: strength training/weights;stretching, Time (Minutes): 30, Frequency (Times/Week): 2 (-3), Weekly Exercise (Minutes/Week): 60, Intensity: Mild  Goals    . Increase water intake          Starting 05/15/16, I will start drinking 2-3 glasses of water a day.      Fall Risk Fall Risk  05/15/2016  Falls in the past year? Yes  Number falls in past yr: 2 or more  Injury with Fall? No  Follow up Falls prevention discussed   Depression Screen PHQ 2/9 Scores 05/15/2016 05/12/2015  PHQ - 2 Score 1 0    Cognitive Function     6CIT Screen 05/15/2016  What Year? 0 points  What month? 3 points  What time? 3 points  Count back from 20 0 points  Months in reverse 0 points  Repeat phrase 10 points  Total Score 16    Immunization History  Administered Date(s) Administered  . Influenza-Unspecified 11/30/2014, 11/18/2015  . Pneumococcal Polysaccharide-23 12/22/2012, 11/18/2015   Screening Tests Health Maintenance  Topic Date Due  . TETANUS/TDAP  03/19/2017 (Originally 02/09/1982)  . COLONOSCOPY  03/19/2026 (Originally 02/09/2013)  . HEMOGLOBIN A1C  05/21/2016  . OPHTHALMOLOGY EXAM  06/21/2016  . FOOT EXAM  04/26/2017  . PNEUMOCOCCAL POLYSACCHARIDE VACCINE (2) 12/22/2017  . INFLUENZA VACCINE  Completed  . Hepatitis C Screening  Completed  . HIV Screening  Completed      Plan:  I have  personally reviewed and addressed the Medicare Annual Wellness questionnaire and have noted the following in the patient's chart:  A. Medical and social history B. Use of alcohol, tobacco or illicit drugs  C. Current medications and supplements D. Functional ability and status E.  Nutritional status F.  Physical activity G. Advance directives H. List of other physicians I.  Hospitalizations, surgeries, and ER visits in previous 12 months J.  [place] such as hearing and vision if needed, cognitive and depression L. Referrals and appointments - none  In addition, I have reviewed and discussed  with patient certain preventive protocols, quality metrics, and best practice recommendations. A written personalized care plan for preventive services as well as general preventive health recommendations were provided to patient.  See attached scanned questionnaire for additional information.   Signed,  Fabio Neighbors, LPN Nurse Health Advisor   MD Recommendations: None. Pt declined tetanus vaccine and colonoscopy referral today.   I have reviewed the health advisor's note, was available for consultation, and agree with documentation and plan  Lelon Huh, MD

## 2016-05-15 NOTE — Patient Instructions (Signed)
Health Maintenance, Male A healthy lifestyle and preventive care is important for your health and wellness. Ask your health care provider about what schedule of regular examinations is right for you. What should I know about weight and diet?  Eat a Healthy Diet  Eat plenty of vegetables, fruits, whole grains, low-fat dairy products, and lean protein.  Do not eat a lot of foods high in solid fats, added sugars, or salt. Maintain a Healthy Weight  Regular exercise can help you achieve or maintain a healthy weight. You should:  Do at least 150 minutes of exercise each week. The exercise should increase your heart rate and make you sweat (moderate-intensity exercise).  Do strength-training exercises at least twice a week. Watch Your Levels of Cholesterol and Blood Lipids  Have your blood tested for lipids and cholesterol every 5 years starting at 54 years of age. If you are at high risk for heart disease, you should start having your blood tested when you are 54 years old. You may need to have your cholesterol levels checked more often if:  Your lipid or cholesterol levels are high.  You are older than 54 years of age.  You are at high risk for heart disease. What should I know about cancer screening? Many types of cancers can be detected early and may often be prevented. Lung Cancer  You should be screened every year for lung cancer if:  You are a current smoker who has smoked for at least 30 years.  You are a former smoker who has quit within the past 15 years.  Talk to your health care provider about your screening options, when you should start screening, and how often you should be screened. Colorectal Cancer  Routine colorectal cancer screening usually begins at 54 years of age and should be repeated every 5-10 years until you are 54 years old. You may need to be screened more often if early forms of precancerous polyps or small growths are found. Your health care provider  may recommend screening at an earlier age if you have risk factors for colon cancer.  Your health care provider may recommend using home test kits to check for hidden blood in the stool.  A small camera at the end of a tube can be used to examine your colon (sigmoidoscopy or colonoscopy). This checks for the earliest forms of colorectal cancer. Prostate and Testicular Cancer  Depending on your age and overall health, your health care provider may do certain tests to screen for prostate and testicular cancer.  Talk to your health care provider about any symptoms or concerns you have about testicular or prostate cancer. Skin Cancer  Check your skin from head to toe regularly.  Tell your health care provider about any new moles or changes in moles, especially if:  There is a change in a mole's size, shape, or color.  You have a mole that is larger than a pencil eraser.  Always use sunscreen. Apply sunscreen liberally and repeat throughout the day.  Protect yourself by wearing long sleeves, pants, a wide-brimmed hat, and sunglasses when outside. What should I know about heart disease, diabetes, and high blood pressure?  If you are 53-29 years of age, have your blood pressure checked every 3-5 years. If you are 25 years of age or older, have your blood pressure checked every year. You should have your blood pressure measured twice-once when you are at a hospital or clinic, and once when you are not at  a hospital or clinic. Record the average of the two measurements. To check your blood pressure when you are not at a hospital or clinic, you can use:  An automated blood pressure machine at a pharmacy.  A home blood pressure monitor.  Talk to your health care provider about your target blood pressure.  If you are between 18-72 years old, ask your health care provider if you should take aspirin to prevent heart disease.  Have regular diabetes screenings by checking your fasting blood sugar  level.  If you are at a normal weight and have a low risk for diabetes, have this test once every three years after the age of 103.  If you are overweight and have a high risk for diabetes, consider being tested at a younger age or more often.  A one-time screening for abdominal aortic aneurysm (AAA) by ultrasound is recommended for men aged 28-75 years who are current or former smokers. What should I know about preventing infection? Hepatitis B  If you have a higher risk for hepatitis B, you should be screened for this virus. Talk with your health care provider to find out if you are at risk for hepatitis B infection. Hepatitis C  Blood testing is recommended for:  Everyone born from [birth year] through 1963.  Anyone with known risk factors for hepatitis C. Sexually Transmitted Diseases (STDs)  You should be screened each year for STDs including gonorrhea and chlamydia if:  You are sexually active and are younger than 54 years of age.  You are older than 54 years of age and your health care provider tells you that you are at risk for this type of infection.  Your sexual activity has changed since you were last screened and you are at an increased risk for chlamydia or gonorrhea. Ask your health care provider if you are at risk.  Talk with your health care provider about whether you are at high risk of being infected with HIV. Your health care provider may recommend a prescription medicine to help prevent HIV infection. What else can I do?  Schedule regular health, dental, and eye exams.  Stay current with your vaccines (immunizations).  Do not use any tobacco products, such as cigarettes, chewing tobacco, and e-cigarettes. If you need help quitting, ask your health care provider.  Limit alcohol intake to no more than 2 drinks per day. One drink equals 12 ounces of beer, 5 ounces of wine, or 1 ounces of hard liquor.  Do not use street drugs.  Do not share needles.  Ask your health  care provider for help if you need support or information about quitting drugs.  Tell your health care provider if you often feel depressed.  Tell your health care provider if you have ever been abused or do not feel safe at home. This information is not intended to replace advice given to you by your health care provider. Make sure you discuss any questions you have with your health care provider. Document Released: 09/01/2007 Document Revised: 11/02/2015 Document Reviewed: 12/07/2014 Elsevier Interactive Patient Education  2017 [employer].

## 2016-05-16 ENCOUNTER — Telehealth: Payer: Self-pay

## 2016-05-16 ENCOUNTER — Telehealth: Payer: Self-pay | Admitting: Family Medicine

## 2016-05-16 LAB — COMPREHENSIVE METABOLIC PANEL
A/G RATIO: 1.9 (ref 1.2–2.2)
ALK PHOS: 117 IU/L (ref 39–117)
ALT: 40 IU/L (ref 0–44)
AST: 28 IU/L (ref 0–40)
Albumin: 4.7 g/dL (ref 3.5–5.5)
BUN/Creatinine Ratio: 9 (ref 9–20)
BUN: 10 mg/dL (ref 6–24)
Bilirubin Total: 0.4 mg/dL (ref 0.0–1.2)
CHLORIDE: 100 mmol/L (ref 96–106)
CO2: 24 mmol/L (ref 18–29)
Calcium: 9 mg/dL (ref 8.7–10.2)
Creatinine, Ser: 1.1 mg/dL (ref 0.76–1.27)
GFR calc Af Amer: 88 mL/min/{1.73_m2} (ref >59)
GFR, EST NON AFRICAN AMERICAN: 76 mL/min/{1.73_m2} (ref >59)
GLOBULIN, TOTAL: 2.5 g/dL (ref 1.5–4.5)
Glucose: 79 mg/dL (ref 65–99)
POTASSIUM: 4.1 mmol/L (ref 3.5–5.2)
SODIUM: 140 mmol/L (ref 134–144)
Total Protein: 7.2 g/dL (ref 6.0–8.5)

## 2016-05-16 LAB — LIPID PANEL
CHOLESTEROL TOTAL: 136 mg/dL (ref 100–199)
Chol/HDL Ratio: 4.4 ratio units (ref 0.0–5.0)
HDL: 31 mg/dL — ABNORMAL LOW (ref >39)
LDL CALC: 64 mg/dL (ref 0–99)
Triglycerides: 207 mg/dL — ABNORMAL HIGH (ref 0–149)
VLDL Cholesterol Cal: 41 mg/dL — ABNORMAL HIGH (ref 5–40)

## 2016-05-16 LAB — VITAMIN D 25 HYDROXY (VIT D DEFICIENCY, FRACTURES): VIT D 25 HYDROXY: 19.2 ng/mL — AB (ref 30.0–100.0)

## 2016-05-16 LAB — HIV ANTIBODY (ROUTINE TESTING W REFLEX): HIV Screen 4th Generation wRfx: NONREACTIVE

## 2016-05-16 LAB — PSA: PROSTATE SPECIFIC AG, SERUM: 0.3 ng/mL (ref 0.0–4.0)

## 2016-05-16 MED ORDER — VITAMIN D (ERGOCALCIFEROL) 1.25 MG (50000 UNIT) PO CAPS
50000.0000 [IU] | ORAL_CAPSULE | ORAL | 12 refills | Status: DC
Start: 2016-05-16 — End: 2017-04-23

## 2016-05-16 NOTE — Telephone Encounter (Signed)
Patient will bring the form by today to have the corrections made-aa

## 2016-05-16 NOTE — Telephone Encounter (Signed)
Pt came in yesterday and had a wellness exam yesterday.  On the top was put workers comp but it is disability on the form.  They are by today and would like it to be corrected.   Thanks [employer]

## 2016-05-16 NOTE — Telephone Encounter (Signed)
-----  Message from Birdie Sons, MD sent at 05/16/2016  7:59 AM EST ----- Vitamin d levels is low, need to take vitamin D 50,000 units once a week, #12, rf x 4. Other labs good. Schedule follow up for diabetes in 4 months.

## 2016-05-18 ENCOUNTER — Telehealth: Payer: Self-pay | Admitting: Family Medicine

## 2016-05-18 NOTE — Telephone Encounter (Signed)
Order for cologuard faxed to [facility]

## 2016-05-21 ENCOUNTER — Telehealth: Payer: Self-pay | Admitting: Family Medicine

## 2016-05-21 MED ORDER — PAROXETINE HCL 20 MG PO TABS: 20.0000 mg | ORAL_TABLET | Freq: Every day | ORAL | 1 refills | Status: DC

## 2016-05-21 NOTE — Telephone Encounter (Signed)
Advised patient's wife as below.

## 2016-05-21 NOTE — Telephone Encounter (Signed)
Have sent rx for paroxetine to [facility]

## 2016-05-21 NOTE — Telephone Encounter (Signed)
Pt wife [name] called states both of pt brothers were found dead yesterday.  [name] is really upset over this.  Pt wife is requesting a Rx to help with the stress.  [facility] [place].  [phone]

## 2016-05-21 NOTE — Telephone Encounter (Signed)
Please advise. Thanks.

## 2016-05-29 DIAGNOSIS — Z1211 Encounter for screening for malignant neoplasm of colon: Secondary | ICD-10-CM | POA: Diagnosis not present

## 2016-05-29 DIAGNOSIS — Z1212 Encounter for screening for malignant neoplasm of rectum: Secondary | ICD-10-CM | POA: Diagnosis not present

## 2016-05-29 LAB — COLOGUARD: Cologuard: NEGATIVE

## 2016-06-06 ENCOUNTER — Telehealth: Payer: Self-pay

## 2016-06-06 NOTE — Telephone Encounter (Signed)
Pt's wife advised.   Thanks,   -Mickel Baas

## 2016-06-06 NOTE — Telephone Encounter (Signed)
-----  Message from Birdie Sons, MD sent at 06/06/2016 10:49 AM EDT ----- cologuard test is normal. recheck in 2021

## 2016-06-06 NOTE — Telephone Encounter (Signed)
LMTCB 06/06/2016  Thanks,   -Mickel Baas

## 2016-06-12 ENCOUNTER — Other Ambulatory Visit: Payer: Self-pay

## 2016-06-12 ENCOUNTER — Other Ambulatory Visit: Payer: Self-pay | Admitting: Family Medicine

## 2016-06-12 MED ORDER — GLUCOSE BLOOD VI STRP: 1.0000 | ORAL_STRIP | Freq: Every day | 4 refills | Status: DC

## 2016-06-12 NOTE — Telephone Encounter (Signed)
Pt contacted office for refill request on the following medications:  ONE TOUCH ULTRA TEST test strip.  [facility].  [phone]

## 2016-06-13 ENCOUNTER — Other Ambulatory Visit: Payer: Self-pay | Admitting: Family Medicine

## 2016-06-14 NOTE — Telephone Encounter (Signed)
He is only supposed to be testing once day, there is no reason for him to test more than that.

## 2016-06-14 NOTE — Telephone Encounter (Signed)
Patient's wife was notified.

## 2016-06-14 NOTE — Telephone Encounter (Signed)
Pharmacy received rx refill sent on 06/12/2016. Insurance will not cover more then 1 strip qd. Patient is using more then 1 strip qd. Pharmacy stated that if provider wants pt to test more then once daily then we will need to sent in new rx for new instructions. Please advise?

## 2016-06-20 ENCOUNTER — Other Ambulatory Visit: Payer: Self-pay | Admitting: Family Medicine

## 2016-07-20 ENCOUNTER — Ambulatory Visit: Payer: Self-pay | Admitting: Family Medicine

## 2016-08-21 DIAGNOSIS — E559 Vitamin D deficiency, unspecified: Secondary | ICD-10-CM | POA: Diagnosis not present

## 2016-08-21 DIAGNOSIS — E1129 Type 2 diabetes mellitus with other diabetic kidney complication: Secondary | ICD-10-CM | POA: Diagnosis not present

## 2016-08-21 DIAGNOSIS — I10 Essential (primary) hypertension: Secondary | ICD-10-CM | POA: Diagnosis not present

## 2016-08-22 ENCOUNTER — Ambulatory Visit (INDEPENDENT_AMBULATORY_CARE_PROVIDER_SITE_OTHER): Payer: Medicare Other | Admitting: Family Medicine

## 2016-08-22 ENCOUNTER — Encounter: Payer: Self-pay | Admitting: Family Medicine

## 2016-08-22 ENCOUNTER — Ambulatory Visit: Payer: Self-pay | Admitting: Family Medicine

## 2016-08-22 VITALS — BP 124/68 | HR 58 | Temp 97.7°F | Resp 16 | Wt 190.0 lb

## 2016-08-22 DIAGNOSIS — E559 Vitamin D deficiency, unspecified: Secondary | ICD-10-CM

## 2016-08-22 DIAGNOSIS — M7550 Bursitis of unspecified shoulder: Secondary | ICD-10-CM | POA: Diagnosis not present

## 2016-08-22 DIAGNOSIS — E119 Type 2 diabetes mellitus without complications: Secondary | ICD-10-CM | POA: Diagnosis not present

## 2016-08-22 DIAGNOSIS — G5602 Carpal tunnel syndrome, left upper limb: Secondary | ICD-10-CM | POA: Diagnosis not present

## 2016-08-22 DIAGNOSIS — I10 Essential (primary) hypertension: Secondary | ICD-10-CM

## 2016-08-22 DIAGNOSIS — L918 Other hypertrophic disorders of the skin: Secondary | ICD-10-CM | POA: Diagnosis not present

## 2016-08-22 DIAGNOSIS — K824 Cholesterolosis of gallbladder: Secondary | ICD-10-CM

## 2016-08-22 DIAGNOSIS — Z794 Long term (current) use of insulin: Secondary | ICD-10-CM | POA: Diagnosis not present

## 2016-08-22 DIAGNOSIS — R1084 Generalized abdominal pain: Secondary | ICD-10-CM | POA: Diagnosis not present

## 2016-08-22 DIAGNOSIS — K760 Fatty (change of) liver, not elsewhere classified: Secondary | ICD-10-CM | POA: Diagnosis not present

## 2016-08-22 LAB — POCT GLYCOSYLATED HEMOGLOBIN (HGB A1C)
ESTIMATED AVERAGE GLUCOSE: 146
Hemoglobin A1C: 6.7

## 2016-08-22 NOTE — Progress Notes (Deleted)
Patient: [name]. Male    DOB: April 21, 1960   56 y.o.   MRN: [id number] Visit Date: 08/22/2016  Today's Provider: Lelon Huh, MD   No chief complaint on file.  Subjective:    HPI  Diabetes Mellitus Type II, Follow-up:   Lab Results  Component Value Date   HGBA1C 6.6 05/15/2016   HGBA1C 7.1 11/22/2015   HGBA1C 7.4 08/22/2015    Last seen for diabetes {1-12:18279} {days/wks/mos/yrs:310907} ago.  Management since then includes ***. He reports {excellent/good/fair/poor:19665} compliance with treatment. He {ACTION; IS/IS SWF:09323557} having side effects. *** Current symptoms include {Symptoms; diabetes:14075} and have been {Desc; course:15616}. Home blood sugar records: {diabetes glucometry results:16657}  Episodes of hypoglycemia? {yes***/no:17258}   Current Insulin Regimen: *** Most Recent Eye Exam: *** Weight trend: {trend:16658} Prior visit with dietician: {yes/no:17258} Current diet: {diet habits:16563} Current exercise: {exercise types:16438}  Pertinent Labs:    Component Value Date/Time   CHOL 136 05/15/2016 1520   TRIG 207 (H) 05/15/2016 1520   HDL 31 (L) 05/15/2016 1520   LDLCALC 64 05/15/2016 1520   CREATININE 1.10 05/15/2016 1520    Wt Readings from Last 3 Encounters:  05/15/16 185 lb 3.2 oz (84 kg)  11/30/15 185 lb (83.9 kg)  11/27/15 191 lb (86.6 kg)       Hypertension, follow-up:  BP Readings from Last 3 Encounters:  05/15/16 124/64  11/30/15 118/70  11/28/15 132/79    He was last seen for hypertension {NUMBERS 1-12:18279} {days/wks/mos/yrs:310907} ago.  BP at that visit was ***. Management since that visit includes ***. He reports {excellent/good/fair/poor:19665} compliance with treatment. He {ACTION; IS/IS DUK:02542706} having side effects. *** He {is/is not:9024} exercising. He {is/is not:9024} adherent to low salt diet.   Outside blood pressures are ***. He is experiencing {Symptoms; cardiac:12860}.  Patient  denies {Symptoms; cardiac:12860}.   Cardiovascular risk factors include {cv risk factors:510}.  Use of agents associated with hypertension: {bp agents assoc with hypertension:511::"none"}.     Weight trend: {trend:16658} Wt Readings from Last 3 Encounters:  05/15/16 185 lb 3.2 oz (84 kg)  11/30/15 185 lb (83.9 kg)  11/27/15 191 lb (86.6 kg)    Current diet: {diet habits:16563}        Allergies  Allergen Reactions  . Bee Venom     All kinds of bees  . Bee Venom Hives    All kinds of bees  . Other     Certain powders  . Sulfa Antibiotics     Other reaction(s): Unknown     Current Outpatient Prescriptions:  .  albuterol (PROVENTIL HFA;VENTOLIN HFA) 108 (90 BASE) MCG/ACT inhaler, Inhale 2 puffs into the lungs every 6 (six) hours as needed for wheezing., Disp: 1 Inhaler, Rfl: 3 .  Cholecalciferol (VITAMIN D3) 50000 units CAPS, Take 50,000 Units by mouth every 30 (thirty) days. (Patient not taking: Reported on 05/15/2016), Disp: 12 capsule, Rfl: 0 .  famotidine (PEPCID) 20 MG tablet, Take 1 tablet (20 mg total) by mouth 2 (two) times daily., Disp: 60 tablet, Rfl: 0 .  FLOVENT DISKUS 100 MCG/BLIST AEPB, inhale 1 puff by mouth twice a day Rinse mouth after use, Disp: 60 each, Rfl: 5 .  glipiZIDE (GLUCOTROL XL) 2.5 MG 24 hr tablet, Take 2.5 mg by mouth daily., Disp: , Rfl:  .  glucose blood (ONE TOUCH ULTRA TEST) test strip, 1 each by Other route daily. for testing, Disp: 100 each, Rfl: 4 .  lisinopril (PRINIVIL,ZESTRIL) 20 MG tablet, take 1  tablet by mouth once daily, Disp: 30 tablet, Rfl: 6 .  metFORMIN (GLUCOPHAGE-XR) 500 MG 24 hr tablet, Take 2 tablets (1,000 mg total) by mouth 2 (two) times daily before a meal., Disp: 120 tablet, Rfl: 12 .  montelukast (SINGULAIR) 10 MG tablet, Take 1 tablet (10 mg total) by mouth at bedtime., Disp: 30 tablet, Rfl: 12 .  naproxen (NAPROSYN) 500 MG tablet, Take 1 tablet (500 mg total) by mouth 2 (two) times daily with a meal., Disp: 30 tablet,  Rfl: 2 .  omeprazole (PRILOSEC) 20 MG capsule, take 1 capsule by mouth once daily, Disp: 30 capsule, Rfl: 5 .  ondansetron (ZOFRAN ODT) 4 MG disintegrating tablet, Take 1 tablet (4 mg total) by mouth every 8 (eight) hours as needed for nausea or vomiting. (Patient not taking: Reported on 05/15/2016), Disp: 20 tablet, Rfl: 0 .  ONETOUCH DELICA LANCETS FINE MISC, TEST once daily, Disp: 100 each, Rfl: 3 .  PARoxetine (PAXIL) 20 MG tablet, Take 1 tablet (20 mg total) by mouth daily., Disp: 30 tablet, Rfl: 1 .  Propylene Glycol (SYSTANE BALANCE) 0.6 % SOLN, Apply to eye., Disp: , Rfl:  .  simvastatin (ZOCOR) 20 MG tablet, take 1 tablet by mouth once daily, Disp: 30 tablet, Rfl: 6 .  tiZANidine (ZANAFLEX) 4 MG capsule, Take 4 mg by mouth every 6 (six) hours as needed (headache). , Disp: , Rfl:  .  triamcinolone cream (KENALOG) 0.5 %, apply to affected area twice a day, Disp: 30 g, Rfl: 5 .  Vitamin D, Ergocalciferol, (DRISDOL) 50000 units CAPS capsule, Take 1 capsule (50,000 Units total) by mouth every 7 (seven) days., Disp: 4 capsule, Rfl: 12  Review of Systems  Social History  Substance Use Topics  . Smoking status: Never Smoker  . Smokeless tobacco: Never Used  . Alcohol use No   Objective:   There were no vitals taken for this visit. There were no vitals filed for this visit.   Physical Exam      Assessment & Plan:           Lelon Huh, MD  [facility]

## 2016-08-23 ENCOUNTER — Telehealth: Payer: Self-pay | Admitting: Family Medicine

## 2016-08-23 DIAGNOSIS — M7550 Bursitis of unspecified shoulder: Secondary | ICD-10-CM

## 2016-08-23 DIAGNOSIS — L918 Other hypertrophic disorders of the skin: Secondary | ICD-10-CM | POA: Insufficient documentation

## 2016-08-23 MED ORDER — NAPROXEN 500 MG PO TABS: 500.0000 mg | ORAL_TABLET | Freq: Two times a day (BID) | ORAL | 2 refills | Status: DC

## 2016-08-23 NOTE — Telephone Encounter (Signed)
Please advise if ok to refer to ortho.

## 2016-08-23 NOTE — Progress Notes (Signed)
Patient: [name]. Male    DOB: 04-11-1960   56 y.o.   MRN: [id number] Visit Date: 08/22/2016  Today's Provider: Lelon Huh, MD   Chief Complaint  Patient presents with  . Diabetes  . Hypertension  . vitamin D deficency     Subjective:    HPI  Diabetes Mellitus Type II, Follow-up:   Lab Results  Component Value Date   HGBA1C 6.6 05/15/2016   HGBA1C 7.1 11/22/2015   HGBA1C 7.4 08/22/2015    Last seen for diabetes 3 months ago.  Management since then includes no changes. He reports good compliance with treatment. He is not having side effects.  Current symptoms include none and have been stable. Home blood sugar records: trend: stable  Episodes of hypoglycemia? no   Current Insulin Regimen: none Most Recent Eye Exam: up to date Weight trend: stable Prior visit with dietician: no Current diet: well balanced Current exercise: none  Pertinent Labs:    Component Value Date/Time   CHOL 136 05/15/2016 1520   TRIG 207 (H) 05/15/2016 1520   HDL 31 (L) 05/15/2016 1520   LDLCALC 64 05/15/2016 1520   CREATININE 1.10 05/15/2016 1520    Wt Readings from Last 3 Encounters:  05/15/16 185 lb 3.2 oz (84 kg)  11/30/15 185 lb (83.9 kg)  11/27/15 191 lb (86.6 kg)       Hypertension, follow-up:  BP Readings from Last 3 Encounters:  05/15/16 124/64  11/30/15 118/70  11/28/15 132/79    He was last seen for hypertension 3 months ago.  BP at that visit was 124/64. Management since that visit includes no changes. He reports good compliance with treatment. He is not having side effects.  He is not exercising. He is adherent to low salt diet.   Outside blood pressures are not checked daily. He is experiencing none.  Patient denies exertional chest pressure/discomfort and palpitations.   Cardiovascular risk factors include diabetes mellitus.     Weight trend: stable Wt Readings from Last 3 Encounters:  05/15/16 185 lb 3.2 oz (84 kg)    11/30/15 185 lb (83.9 kg)  11/27/15 191 lb (86.6 kg)    Current diet: well balanced   Vitamin D deficiency, follow up: Patient was last seen 3 months ago. Since then, it was recommended that he start Vit D 50,000u once weekly. Patient reports that he only took 4 doses. After the medication ran out, he never got it refilled.  Lab Results  Component Value Date   VD25OH 19.2 (L) 05/15/2016   He also complains of several skin tags under both arms that have been bothering him for several moths   He has also been having pain in left hand and forearm for several months. 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 He has not seen his orthopedist for several months  He also complains of nodule on his left lateral knee that has been a little sore for several months.   He also complains of recurrent lower abdominal pain for for several months. Is getting a little more persistent. Feels a little nauseated at times.   He also complains of left shoulder pain, Is unable to raise arme above head. Has been bothering him  A few weeks and getting worse. No weakness or numbness.      Allergies  Allergen Reactions  . Bee Venom     All kinds of bees  .  Bee Venom Hives    All kinds of bees  . Other     Certain powders  . Sulfa Antibiotics     Other reaction(s): Unknown     Current Outpatient Prescriptions:  .  albuterol (PROVENTIL HFA;VENTOLIN HFA) 108 (90 BASE) MCG/ACT inhaler, Inhale 2 puffs into the lungs every 6 (six) hours as needed for wheezing., Disp: 1 Inhaler, Rfl: 3 .  Cholecalciferol (VITAMIN D3) 50000 units CAPS, Take 50,000 Units by mouth every 30 (thirty) days. (Patient not taking: Reported on 05/15/2016), Disp: 12 capsule, Rfl: 0 .  famotidine (PEPCID) 20 MG tablet, Take 1 tablet (20 mg total) by mouth 2 (two) times daily., Disp: 60 tablet, Rfl: 0 .  FLOVENT DISKUS 100 MCG/BLIST AEPB, inhale 1 puff by mouth twice a day Rinse mouth after use,  Disp: 60 each, Rfl: 5 .  glipiZIDE (GLUCOTROL XL) 2.5 MG 24 hr tablet, Take 2.5 mg by mouth daily., Disp: , Rfl:  .  glucose blood (ONE TOUCH ULTRA TEST) test strip, 1 each by Other route daily. for testing, Disp: 100 each, Rfl: 4 .  lisinopril (PRINIVIL,ZESTRIL) 20 MG tablet, take 1 tablet by mouth once daily, Disp: 30 tablet, Rfl: 6 .  metFORMIN (GLUCOPHAGE-XR) 500 MG 24 hr tablet, Take 2 tablets (1,000 mg total) by mouth 2 (two) times daily before a meal., Disp: 120 tablet, Rfl: 12 .  montelukast (SINGULAIR) 10 MG tablet, Take 1 tablet (10 mg total) by mouth at bedtime., Disp: 30 tablet, Rfl: 12 .  naproxen (NAPROSYN) 500 MG tablet, Take 1 tablet (500 mg total) by mouth 2 (two) times daily with a meal., Disp: 30 tablet, Rfl: 2 .  omeprazole (PRILOSEC) 20 MG capsule, take 1 capsule by mouth once daily, Disp: 30 capsule, Rfl: 5 .  ondansetron (ZOFRAN ODT) 4 MG disintegrating tablet, Take 1 tablet (4 mg total) by mouth every 8 (eight) hours as needed for nausea or vomiting. (Patient not taking: Reported on 05/15/2016), Disp: 20 tablet, Rfl: 0 .  ONETOUCH DELICA LANCETS FINE MISC, TEST once daily, Disp: 100 each, Rfl: 3 .  PARoxetine (PAXIL) 20 MG tablet, Take 1 tablet (20 mg total) by mouth daily., Disp: 30 tablet, Rfl: 1 .  Propylene Glycol (SYSTANE BALANCE) 0.6 % SOLN, Apply to eye., Disp: , Rfl:  .  simvastatin (ZOCOR) 20 MG tablet, take 1 tablet by mouth once daily, Disp: 30 tablet, Rfl: 6 .  tiZANidine (ZANAFLEX) 4 MG capsule, Take 4 mg by mouth every 6 (six) hours as needed (headache). , Disp: , Rfl:  .  triamcinolone cream (KENALOG) 0.5 %, apply to affected area twice a day, Disp: 30 g, Rfl: 5 .  Vitamin D, Ergocalciferol, (DRISDOL) 50000 units CAPS capsule, Take 1 capsule (50,000 Units total) by mouth every 7 (seven) days., Disp: 4 capsule, Rfl: 12  Review of Systems  Constitutional: Negative.   Respiratory: Negative.   Cardiovascular: Negative.   Gastrointestinal: Positive for abdominal  pain. Negative for abdominal distention, anal bleeding, blood in stool, constipation, diarrhea, nausea, rectal pain and vomiting.  Musculoskeletal: Positive for myalgias.  Neurological: Negative.     Social History  Substance Use Topics  . Smoking status: Never Smoker  . Smokeless tobacco: Never Used  . Alcohol use No   Objective:   Vitals:   08/22/16 1151  BP: 124/68  Pulse: (!) 58  Resp: 16  Temp: 97.7 F (36.5 C)  SpO2: 96%  Weight: 190 lb (86.2 kg)      Physical Exam  General Appearance:    Alert, cooperative, no distress, obese  Eyes:    PERRL, conjunctiva/corneas clear, EOM's intact       Lungs:     Clear to auscultation bilaterally, respirations unlabored  Heart:    Regular rate and rhythm  Abd   Obese, soft non-tender, non distended. No masses.  Neurologic:   Awake, alert, oriented x 3. No apparent focal neurological           defect.   Skin:   Numerous non-irritated non-pigmented skin tags   MS:    Tender over right acromium with positive impingement. Positive Tinel's sign on left.     Results for orders placed or performed in visit on 08/22/16  POCT glycosylated hemoglobin (Hb A1C)  Result Value Ref Range   Hemoglobin A1C 6.7    Est. average glucose Bld gHb Est-mCnc 146        Assessment & Plan:        1. Controlled type 2 diabetes mellitus without complication, with long-term current use of insulin (Bruno) Well controlled.  Continue current medications.   - POCT glycosylated hemoglobin (Hb A1C)  2. Essential hypertension Well controlled.  Continue current medications.    3. Vitamin D deficiency Counseled to take daily vitamin d supplement  4. Skin tags, multiple acquired Refer dermatology  5. Subacromial bursitis Naprosyn. Advised he needs to follow up with his orthopedist, particularly since CTS is worsening as below.   6. NAFLD (nonalcoholic fatty liver disease) Follow up abd u/s   7. Gall bladder polyp abd u/s  8. Carpal tunnel  syndrome on left Advised needs to follow up with orthopedics.   9. Generalized abdominal pain Abdominal u/s  Addressed extensive list of chronic and acute medical problems today requiring extensive time in counseling and coordination care.  Over half of this 45 minute visit were spent in counseling and coordinating care of multiple medical problems.   Lelon Huh, MD  [facility]

## 2016-08-23 NOTE — Telephone Encounter (Signed)
FYI--Appointment was scheduled for pt to see a dermatologist but at pt's request this has been cancelled

## 2016-08-23 NOTE — Telephone Encounter (Signed)
Pt is requesting referral to ortho for right shoulder pain

## 2016-08-29 ENCOUNTER — Encounter: Payer: Self-pay | Admitting: Family Medicine

## 2016-09-21 ENCOUNTER — Telehealth: Payer: Self-pay

## 2016-09-21 ENCOUNTER — Ambulatory Visit
Admission: RE | Admit: 2016-09-21 | Discharge: 2016-09-21 | Disposition: A | Discharge status: Home / Self Care | Payer: Medicare Other | Source: Ambulatory Visit | Attending: Family Medicine | Admitting: Family Medicine

## 2016-09-21 DIAGNOSIS — K824 Cholesterolosis of gallbladder: Secondary | ICD-10-CM

## 2016-09-21 DIAGNOSIS — N281 Cyst of kidney, acquired: Secondary | ICD-10-CM | POA: Insufficient documentation

## 2016-09-21 DIAGNOSIS — K760 Fatty (change of) liver, not elsewhere classified: Secondary | ICD-10-CM | POA: Insufficient documentation

## 2016-09-21 NOTE — Telephone Encounter (Signed)
Patient's wife [name] called back and was advised of results. [name] agrees to referral. Please schedule. Thanks.

## 2016-09-21 NOTE — Telephone Encounter (Signed)
-----  Message from Birdie Sons, MD sent at 09/21/2016  2:01 PM EDT ----- Ultrasound is unchanged, has a few gallbladder polyps and fatty liver. He should get Hepatitis a and hepatitis B vaccine due to having fatty liver.  If he is still having abdominal pain then can refer to surgery.

## 2016-09-24 ENCOUNTER — Other Ambulatory Visit: Payer: Self-pay | Admitting: Family Medicine

## 2016-09-24 DIAGNOSIS — E119 Type 2 diabetes mellitus without complications: Secondary | ICD-10-CM

## 2016-09-26 ENCOUNTER — Ambulatory Visit (INDEPENDENT_AMBULATORY_CARE_PROVIDER_SITE_OTHER): Payer: Medicare Other | Admitting: Family Medicine

## 2016-09-26 DIAGNOSIS — Z23 Encounter for immunization: Secondary | ICD-10-CM

## 2016-09-26 DIAGNOSIS — K760 Fatty (change of) liver, not elsewhere classified: Secondary | ICD-10-CM | POA: Diagnosis not present

## 2016-09-30 NOTE — Progress Notes (Signed)
Vaccine only, no MD visit.

## 2016-10-08 ENCOUNTER — Other Ambulatory Visit: Payer: Self-pay | Admitting: Family Medicine

## 2016-10-11 ENCOUNTER — Telehealth: Payer: Self-pay | Admitting: Family Medicine

## 2016-10-11 NOTE — Telephone Encounter (Signed)
Pt needs refill on his one touch test strips.  He uses [facility] on [address].  Pt's call back is [phone]  Thanks teri

## 2016-10-12 MED ORDER — GLUCOSE BLOOD VI STRP: 1.0000 | ORAL_STRIP | Freq: Every day | 4 refills | Status: DC

## 2016-10-17 ENCOUNTER — Other Ambulatory Visit: Payer: Self-pay | Admitting: Family Medicine

## 2016-10-31 ENCOUNTER — Other Ambulatory Visit: Payer: Self-pay | Admitting: Gastroenterology

## 2016-10-31 ENCOUNTER — Encounter: Payer: Self-pay | Admitting: Gastroenterology

## 2016-10-31 ENCOUNTER — Ambulatory Visit (INDEPENDENT_AMBULATORY_CARE_PROVIDER_SITE_OTHER): Payer: Medicare Other | Admitting: Gastroenterology

## 2016-10-31 ENCOUNTER — Other Ambulatory Visit: Payer: Self-pay

## 2016-10-31 VITALS — BP 130/77 | HR 59 | Temp 98.3°F | Ht 64.0 in | Wt 185.0 lb

## 2016-10-31 DIAGNOSIS — K760 Fatty (change of) liver, not elsewhere classified: Secondary | ICD-10-CM | POA: Diagnosis not present

## 2016-10-31 DIAGNOSIS — R1011 Right upper quadrant pain: Secondary | ICD-10-CM

## 2016-10-31 NOTE — Patient Instructions (Signed)
1. Recommend [name] elastography 2. Recommend EGD to evaluate RUQ pain  Please call our office to speak with my nurse Driscilla Grammes at [phone] during business hours from 8am to 4pm if you have any questions/concerns. During after hours, you will be redirected to on call GI physician. For any emergency please call 911 or go the nearest emergency room.    Cephas Darby, MD [address]  [place]  [place], [place] [address]  Main: [phone]  Fax: [phone]

## 2016-10-31 NOTE — Progress Notes (Signed)
Cephas Darby, MD [address]  [place]  [place], [place] [address]  Main: [phone]  Fax: [phone]    Gastroenterology Consultation  Referring Provider:     Birdie Sons, MD Primary Care Physician:  Birdie Sons, MD Primary Gastroenterologist:  Dr. Cephas Darby Reason for Consultation:     Right upper quadrant pain        HPI:   Alex Hardgrove. is a 56 y.o. y/o male referred for consultation & management  by Dr. Caryn Section, Kirstie Peri, MD. He has been dealing with 1 month history of severe right upper quadrant pain, 10 out of 10 in severity, constant, worse with eating, bending over, changing position. Muscle relaxant provides some relief. Pain radiates to center of the back. It is not associated with nausea, vomiting, altered bowel habits. He does not have fever, chills, rash, lesions. He denies direct trauma to that site, lifting heavy weights. He has been dealing with chronic low back pain issues which restricts his mobility. Ultrasound abdomen showed gallbladder polyps but no gallstones, fatty liver and fatty pancreas. His wife reports that he drinks a lot of sodas, consumes red meat daily. He is morbidly obese and does not exercise. He reports having several colonoscopies in the past at [facility]. He had EGD in 1998 and it was unremarkable. He had CT abdomen and pelvis in 2014 and it was fairly unremarkable except fatty liver. His LFTs have been mildly elevated.   First noted abnormality in LFT's in 2016 .  Alcohol use: None  Drug use: None Over the counter herbal supplements: None Acetaminophen/NSAIDs: None New medications: None Tattoos: None Military service: None Prior blood transfusion: None Incarceration: None History of travel: None Family history of liver disease/HCC: None Recent change in weight: weight gain   Past Medical History:  Diagnosis Date  . Arrhythmia   . Cyst of kidney, acquired   . Diabetes mellitus without complication ([place])    type 2  . History of chicken pox   . History of measles as a child   . Hyperlipidemia   . Hypertension   . Irregular heart beat     Past Surgical History:  Procedure Laterality Date  . Cardiac Catherization     [facility]  . CARDIAC CATHETERIZATION     [facility]  . Ligament Removal Left    of left thumb: dr. Cleda Mccreedy  . ligament removal  of left thumb     Dr. Cleda Mccreedy  . NM GATED MYOCARDIAL STUDY (ARMX HX)  06/23/2014   [place]. Normal    Prior to Admission medications   Medication Sig Start Date End Date Taking? Authorizing Provider  Cholecalciferol (VITAMIN D3) 50000 units CAPS Take 50,000 Units by mouth every 30 (thirty) days. 08/22/15  Yes Birdie Sons, MD  FLOVENT DISKUS 100 MCG/BLIST AEPB inhale 1 puff by mouth twice a day Rinse mouth after use 03/21/16  Yes Fisher, Kirstie Peri, MD  glipiZIDE (GLUCOTROL XL) 2.5 MG 24 hr tablet Take 2.5 mg by mouth daily. 04/16/16  Yes [provider]  glucose blood (ONE TOUCH ULTRA TEST) test strip 1 each by Other route daily. for testing 10/12/16  Yes Birdie Sons, MD  lisinopril (PRINIVIL,ZESTRIL) 20 MG tablet take 1 tablet by mouth once daily 09/24/16  Yes Birdie Sons, MD  metFORMIN (GLUCOPHAGE-XR) 500 MG 24 hr tablet take 2 tablets by mouth twice a day before meals 09/24/16  Yes Birdie Sons, MD  montelukast (SINGULAIR) 10  MG tablet Take 1 tablet (10 mg total) by mouth at bedtime. 10/11/15  Yes Birdie Sons, MD  naproxen (NAPROSYN) 500 MG tablet Take 1 tablet (500 mg total) by mouth 2 (two) times daily with a meal. 08/23/16  Yes Birdie Sons, MD  omeprazole (PRILOSEC) 20 MG capsule take 1 capsule by mouth once daily 06/13/16  Yes Fisher, Kirstie Peri, MD  [facility] DELICA LANCETS 08X MISC TEST once daily 09/24/16  Yes Birdie Sons, MD  [facility] DELICA LANCETS FINE MISC TEST once daily 04/23/16  Yes Birdie Sons, MD  PARoxetine (PAXIL) 20 MG tablet Take 1 tablet (20 mg total) by mouth daily. 05/21/16  Yes Birdie Sons, MD  PROAIR HFA  108 [phone] Base) MCG/ACT inhaler inhale 2 puffs by mouth every 6 hours if needed for wheezing 10/17/16  Yes Birdie Sons, MD  Propylene Glycol (SYSTANE BALANCE) 0.6 % SOLN Apply to eye.   Yes [provider]  simvastatin (ZOCOR) 20 MG tablet take 1 tablet by mouth once daily 03/09/16  Yes Fisher, Kirstie Peri, MD  tiZANidine (ZANAFLEX) 4 MG tablet take 1 tablet by mouth every 6 hours if needed for headache 10/08/16  Yes Birdie Sons, MD  Vitamin D, Ergocalciferol, (DRISDOL) 50000 units CAPS capsule Take 1 capsule (50,000 Units total) by mouth every 7 (seven) days. 05/16/16  Yes Birdie Sons, MD  famotidine (PEPCID) 20 MG tablet Take 1 tablet (20 mg total) by mouth 2 (two) times daily. Patient not taking: Reported on 10/31/2016 11/28/15   Carrie Mew, MD  HAVRIX 1440 EL U/ML injection  09/26/16   [provider]  ondansetron (ZOFRAN ODT) 4 MG disintegrating tablet Take 1 tablet (4 mg total) by mouth every 8 (eight) hours as needed for nausea or vomiting. Patient not taking: Reported on 10/31/2016 11/28/15   Carrie Mew, MD  triamcinolone cream (KENALOG) 0.5 % apply to affected area twice a day Patient not taking: Reported on 10/31/2016 12/05/14   Birdie Sons, MD    Family History  Problem Relation Age of Onset  . Cancer Mother        throat  . Diabetes Father   . Heart disease Father   . Cancer Maternal Grandmother   . Cancer Maternal Grandfather        pancreatic  . Cancer Paternal Grandfather      Social History  Substance Use Topics  . Smoking status: Never Smoker  . Smokeless tobacco: Never Used  . Alcohol use No    Allergies as of 10/31/2016 - Review Complete 10/31/2016  Allergen Reaction Noted  . Bee venom Hives 10/22/2014  . Other  10/09/2014  . Sulfa antibiotics  01/04/2014    Review of Systems:    All systems reviewed and negative except where noted in HPI.   Physical Exam:  BP 130/77 (BP Location: Right Arm, Patient Position: Sitting,  Cuff Size: Large)   Pulse (!) 59   Temp 98.3 F (36.8 C) (Oral)   Ht 5' 4" (1.626 m)   Wt 83.9 kg (185 lb)   BMI 31.76 kg/m  No LMP for male patient. Psych:  Alert and cooperative. Normal mood and affect. General:   Alert,  Well-developed, well-nourished, pleasant and cooperative in NAD Head:  Normocephalic and atraumatic. Eyes:  Sclera clear, no icterus.   Conjunctiva pink. Ears:  Normal auditory acuity. Nose:  No deformity, discharge, or lesions. Mouth:  No deformity or lesions,oropharynx pink & moist. Neck:  Supple; no  masses or thyromegaly. Lungs:  Respirations even and unlabored.  Clear throughout to auscultation.   No wheezes, crackles, or rhonchi. No acute distress. Heart:  Regular rate and rhythm; no murmurs, clicks, rubs, or gallops. Abdomen:  Normal bowel sounds.  No bruits.  Soft, tenderness in right upper quadrant, superficial tenderness present and non-distended without masses, hepatosplenomegaly or hernias noted.  No guarding or rebound tenderness.    Msk:  Symmetrical without gross deformities. Limping gait, right leg due to pain in RUQ & strength bilaterally. Pulses:  Normal pulses noted. Extremities:  No clubbing or edema.  No cyanosis. Neurologic:  Alert and oriented x3;  grossly normal neurologically. Skin:  Intact without significant lesions or rashes. No jaundice. Lymph Nodes:  No significant cervical adenopathy. Psych:  Alert and cooperative. Normal mood and affect.  Imaging Studies: Reviewed  Assessment and Plan:   [name]. is a 56 y.o. y/o male has been referred for Right upper quadrant pain and elevated LFTs.  1. Right upper quadrant pain: Most likely musculoskeletal versus intra-abdominal versus hepatobiliary versus gastric versus pancreatic. Gallbladder polyps are less likely to cause right upper quadrant pain. As he is insisting on surgery for gallbladder removal, I would like to perform upper endoscopy first to rule out any gastric cause.  I encouraged him to undergo physical therapy and see if it helps. If everything else is negative, I will refer him to Gen. surgery to evaluate for cholecystectomy. Sometimes fatty liver with hepatomegaly may cause right upper quadrant pain from stretching of the liver capsule.  2. Elevated LFTs: No evidence of chronic liver disease, however given metabolic syndrome and chronicity of fatty liver and elevated LFTs, I will order ultrasound elastography to evaluate for any underlying fibrosis.  3. Cologuard test normal on 05/29/2016  Follow up in 3 months   Cephas Darby, MD

## 2016-11-01 ENCOUNTER — Telehealth: Payer: Self-pay

## 2016-11-01 NOTE — Telephone Encounter (Signed)
Patients wife called to let you know that her husband felt bad about the way he may have treated you.  He wanted to let you know he appreciated your time and patience with him.  He really likes you, still grieving over the loss of his two brothers that were murdered last year as well.

## 2016-11-02 ENCOUNTER — Ambulatory Visit
Admission: RE | Admit: 2016-11-02 | Discharge: 2016-11-02 | Disposition: A | Discharge status: Home / Self Care | Payer: Medicare Other | Source: Ambulatory Visit | Attending: Gastroenterology | Admitting: Gastroenterology

## 2016-11-02 DIAGNOSIS — K760 Fatty (change of) liver, not elsewhere classified: Secondary | ICD-10-CM

## 2016-11-05 ENCOUNTER — Telehealth: Payer: Self-pay | Admitting: Gastroenterology

## 2016-11-05 NOTE — Telephone Encounter (Signed)
Patients wife called to get results of ultrasound and would like for me to send her a copy of it in the mail as well.  Please advise on husbands results.   Thanks  [employer]

## 2016-11-05 NOTE — Telephone Encounter (Signed)
Patients wife called and was wondering about results. Please call

## 2016-11-06 ENCOUNTER — Telehealth: Payer: Self-pay

## 2016-11-06 ENCOUNTER — Telehealth: Payer: Self-pay | Admitting: Gastroenterology

## 2016-11-06 NOTE — Telephone Encounter (Signed)
Patients wife has been infomed the U/S Elastography did not show permanent liver damage, however he needs to start adopting a healthy lifestyle.

## 2016-11-06 NOTE — Telephone Encounter (Signed)
Patient came into the office and said to cancel his procedure.

## 2016-11-12 ENCOUNTER — Other Ambulatory Visit: Payer: Self-pay | Admitting: Family Medicine

## 2016-11-12 MED ORDER — SIMVASTATIN 20 MG PO TABS: 20.0000 mg | ORAL_TABLET | Freq: Every day | ORAL | 6 refills | Status: DC

## 2016-11-12 NOTE — Telephone Encounter (Signed)
[facility] faxed a request on the following medication. Thanks CC  simvastatin (ZOCOR) 20 MG tablet  >Take 1 tablet by mouth once daily.

## 2016-11-16 ENCOUNTER — Other Ambulatory Visit: Payer: Self-pay | Admitting: Family Medicine

## 2016-11-16 DIAGNOSIS — Z794 Long term (current) use of insulin: Principal | ICD-10-CM

## 2016-11-16 DIAGNOSIS — R062 Wheezing: Secondary | ICD-10-CM

## 2016-11-16 DIAGNOSIS — E119 Type 2 diabetes mellitus without complications: Secondary | ICD-10-CM

## 2016-11-22 ENCOUNTER — Telehealth: Payer: Self-pay | Admitting: Gastroenterology

## 2016-11-22 ENCOUNTER — Ambulatory Visit: Admit: 2016-11-22 | Payer: Medicare Other

## 2016-11-22 SURGERY — ESOPHAGOGASTRODUODENOSCOPY (EGD) WITH PROPOFOL
Anesthesia: General

## 2016-11-22 NOTE — Telephone Encounter (Signed)
Patient called again to resch procedure due to having abd pain. Spoke with pt wife and assured her the nurse would call back to resch when she got a chance.

## 2016-11-22 NOTE — Telephone Encounter (Signed)
Patient left a voice message that he wants to reschedule his procedure

## 2016-11-26 ENCOUNTER — Other Ambulatory Visit: Payer: Self-pay

## 2016-11-26 DIAGNOSIS — R1011 Right upper quadrant pain: Secondary | ICD-10-CM

## 2016-12-12 ENCOUNTER — Other Ambulatory Visit: Payer: Self-pay | Admitting: Family Medicine

## 2016-12-27 ENCOUNTER — Encounter
Admission: RE | Disposition: A | Discharge status: Home / Self Care | Payer: Self-pay | Source: Ambulatory Visit | Attending: Gastroenterology

## 2016-12-27 ENCOUNTER — Ambulatory Visit
Admission: RE | Admit: 2016-12-27 | Discharge: 2016-12-27 | Disposition: A | Discharge status: Home / Self Care | Payer: Medicare Other | Source: Ambulatory Visit | Attending: Gastroenterology | Admitting: Gastroenterology

## 2016-12-27 ENCOUNTER — Ambulatory Visit: Payer: Medicare Other | Admitting: Anesthesiology

## 2016-12-27 ENCOUNTER — Encounter: Payer: Self-pay | Admitting: *Deleted

## 2016-12-27 DIAGNOSIS — K317 Polyp of stomach and duodenum: Secondary | ICD-10-CM | POA: Diagnosis not present

## 2016-12-27 DIAGNOSIS — R1011 Right upper quadrant pain: Secondary | ICD-10-CM

## 2016-12-27 DIAGNOSIS — Z79899 Other long term (current) drug therapy: Secondary | ICD-10-CM | POA: Insufficient documentation

## 2016-12-27 DIAGNOSIS — K635 Polyp of colon: Secondary | ICD-10-CM | POA: Diagnosis not present

## 2016-12-27 DIAGNOSIS — I739 Peripheral vascular disease, unspecified: Secondary | ICD-10-CM | POA: Insufficient documentation

## 2016-12-27 DIAGNOSIS — K219 Gastro-esophageal reflux disease without esophagitis: Secondary | ICD-10-CM | POA: Diagnosis not present

## 2016-12-27 DIAGNOSIS — Z7984 Long term (current) use of oral hypoglycemic drugs: Secondary | ICD-10-CM | POA: Insufficient documentation

## 2016-12-27 DIAGNOSIS — D121 Benign neoplasm of appendix: Secondary | ICD-10-CM | POA: Diagnosis not present

## 2016-12-27 DIAGNOSIS — E785 Hyperlipidemia, unspecified: Secondary | ICD-10-CM | POA: Insufficient documentation

## 2016-12-27 DIAGNOSIS — I10 Essential (primary) hypertension: Secondary | ICD-10-CM | POA: Diagnosis not present

## 2016-12-27 DIAGNOSIS — E119 Type 2 diabetes mellitus without complications: Secondary | ICD-10-CM | POA: Diagnosis not present

## 2016-12-27 DIAGNOSIS — R609 Edema, unspecified: Secondary | ICD-10-CM | POA: Diagnosis not present

## 2016-12-27 DIAGNOSIS — J449 Chronic obstructive pulmonary disease, unspecified: Secondary | ICD-10-CM | POA: Diagnosis not present

## 2016-12-27 HISTORY — PX: ESOPHAGOGASTRODUODENOSCOPY (EGD) WITH PROPOFOL: SHX5813

## 2016-12-27 LAB — GLUCOSE, CAPILLARY: GLUCOSE-CAPILLARY: 167 mg/dL — AB (ref 65–99)

## 2016-12-27 SURGERY — ESOPHAGOGASTRODUODENOSCOPY (EGD) WITH PROPOFOL
Anesthesia: General

## 2016-12-27 MED ORDER — PROPOFOL 500 MG/50ML IV EMUL
INTRAVENOUS | Status: AC
  Filled 2016-12-27: qty 50

## 2016-12-27 MED ORDER — PROPOFOL 500 MG/50ML IV EMUL
INTRAVENOUS | Status: DC | PRN
  Administered 2016-12-27: 150 ug/kg/min via INTRAVENOUS

## 2016-12-27 MED ORDER — PROPOFOL 10 MG/ML IV BOLUS
INTRAVENOUS | Status: DC | PRN
  Administered 2016-12-27: 50 mg via INTRAVENOUS
  Administered 2016-12-27: 30 mg via INTRAVENOUS

## 2016-12-27 MED ORDER — LIDOCAINE HCL (CARDIAC) 20 MG/ML IV SOLN
INTRAVENOUS | Status: DC | PRN
Start: 2016-12-27 — End: 2016-12-27
  Administered 2016-12-27: 100 mg via INTRAVENOUS

## 2016-12-27 MED ORDER — PROPOFOL 10 MG/ML IV BOLUS
INTRAVENOUS | Status: AC
  Filled 2016-12-27: qty 20

## 2016-12-27 MED ORDER — SODIUM CHLORIDE 0.9 % IV SOLN
INTRAVENOUS | Status: DC
  Administered 2016-12-27: 1000 mL via INTRAVENOUS

## 2016-12-27 MED ORDER — GLYCOPYRROLATE 0.2 MG/ML IJ SOLN
INTRAMUSCULAR | Status: AC
  Filled 2016-12-27: qty 1

## 2016-12-27 MED ORDER — LIDOCAINE HCL (PF) 2 % IJ SOLN
INTRAMUSCULAR | Status: AC
  Filled 2016-12-27: qty 10

## 2016-12-27 MED ORDER — GLYCOPYRROLATE 0.2 MG/ML IJ SOLN
INTRAMUSCULAR | Status: DC | PRN
  Administered 2016-12-27: 0.2 mg via INTRAVENOUS

## 2016-12-27 NOTE — Anesthesia Procedure Notes (Signed)
Date/Time: 12/27/2016 10:15 AM Performed by: Darlyne Russian Pre-anesthesia Checklist: Patient identified, Emergency Drugs available, Suction available, Patient being monitored and Timeout performed Patient Re-evaluated:Patient Re-evaluated prior to induction Oxygen Delivery Method: Nasal cannula Placement Confirmation: positive ETCO2

## 2016-12-27 NOTE — Anesthesia Post-op Follow-up Note (Signed)
Anesthesia QCDR form completed.

## 2016-12-27 NOTE — Op Note (Signed)
[facility] Patient Name: [name] Procedure Date: 12/27/2016 10:19 AM MRN: [id number] Account #: [id number] Date of Birth: 1960/06/05 Admit Type: Outpatient Age: 56 Room: [facility] ENDO ROOM 3 Gender: Male Note Status: Finalized Procedure:            Upper GI endoscopy Indications:          Abdominal pain in the right upper quadrant Providers:            Lin Landsman MD, MD Referring MD:         No Local Md, MD (Referring MD) Medicines:            Monitored Anesthesia Care Complications:        No immediate complications. Estimated blood loss:                        Minimal. Procedure:            Pre-Anesthesia Assessment:                       - Prior to the procedure, a History and Physical was                        performed, and patient medications and allergies were                        reviewed. The patient is competent. The risks and                        benefits of the procedure and the sedation options and                        risks were discussed with the patient. All questions                        were answered and informed consent was obtained.                        Patient identification and proposed procedure were                        verified by the physician, the nurse, the                        anesthesiologist, the anesthetist and the technician in                        the pre-procedure area in the procedure room. Mental                        Status Examination: alert and oriented. Airway                        Examination: normal oropharyngeal airway and neck                        mobility. Respiratory Examination: clear to                        auscultation. CV Examination: normal. Prophylactic  Antibiotics: The patient does not require prophylactic                        antibiotics. Prior Anticoagulants: The patient has                        taken no previous anticoagulant  or antiplatelet agents.                        ASA Grade Assessment: III - A patient with severe                        systemic disease. After reviewing the risks and                        benefits, the patient was deemed in satisfactory                        condition to undergo the procedure. The anesthesia plan                        was to use monitored anesthesia care (MAC). Immediately                        prior to administration of medications, the patient was                        re-assessed for adequacy to receive sedatives. The                        heart rate, respiratory rate, oxygen saturations, blood                        pressure, adequacy of pulmonary ventilation, and                        response to care were monitored throughout the                        procedure. The physical status of the patient was                        re-assessed after the procedure.                       After obtaining informed consent, the endoscope was                        passed under direct vision. Throughout the procedure,                        the patient's blood pressure, pulse, and oxygen                        saturations were monitored continuously. The Endoscope                        was introduced through the mouth, and advanced to the  second part of duodenum. The upper GI endoscopy was                        accomplished without difficulty. The patient tolerated                        the procedure well. Findings:      The duodenal bulb and second portion of the duodenum were normal.      Diffuse mildly erythematous mucosa without bleeding was found in the       gastric body.      Multiple small sessile polyps with no bleeding and no stigmata of recent       bleeding were found in the gastric body and on the greater curvature of       the gastric body. Biopsies were taken with a cold forceps for histology.      A small amount of food (residue)  was found in the gastric fundus.      Esophagogastric landmarks were identified: the gastroesophageal junction       was found at 35 cm from the incisors.      The upper third of the esophagus, middle third of the esophagus, lower       third of the esophagus and gastroesophageal junction were normal. Impression:           - Normal duodenal bulb and second portion of the                        duodenum.                       - Erythematous mucosa in the gastric body.                       - Multiple gastric polyps. Biopsied.                       - A small amount of food (residue) in the stomach.                       - Esophagogastric landmarks identified.                       - Normal upper third of esophagus, middle third of                        esophagus, lower third of esophagus and                        gastroesophageal junction. Recommendation:       - Await pathology results.                       - Discharge patient to home.                       - Resume previous diet today.                       - Continue present medications.                       - Recommend gastric emptying study at appointment to be  scheduled. Procedure Code(s):    --- Professional ---                       [phone], Esophagogastroduodenoscopy, flexible, transoral;                        with biopsy, single or multiple Diagnosis Code(s):    --- Professional ---                       K31.89, Other diseases of stomach and duodenum                       K31.7, Polyp of stomach and duodenum                       R10.11, Right upper quadrant pain CPT copyright 2016 American Medical Association. All rights reserved. The codes documented in this report are preliminary and upon coder review may  be revised to meet current compliance requirements. Dr. Ulyess Mort Lin Landsman MD, MD 12/27/2016 10:40:45 AM This report has been signed electronically. Number of Addenda: 0 Note  Initiated On: 12/27/2016 10:19 AM      [facility]

## 2016-12-27 NOTE — Anesthesia Postprocedure Evaluation (Signed)
Anesthesia Post Note  Patient: [name].  Procedure(s) Performed: ESOPHAGOGASTRODUODENOSCOPY (EGD) WITH PROPOFOL (N/A )  Patient location during evaluation: PACU Anesthesia Type: General Level of consciousness: awake and alert Pain management: pain level controlled Vital Signs Assessment: post-procedure vital signs reviewed and stable Respiratory status: spontaneous breathing, nonlabored ventilation, respiratory function stable and patient connected to nasal cannula oxygen Cardiovascular status: blood pressure returned to baseline and stable Postop Assessment: no apparent nausea or vomiting Anesthetic complications: no     Last Vitals:  Vitals:   12/27/16 1040 12/27/16 1050  BP: 129/84 123/86  Pulse: 62 (!) 56  Resp: 17 14  Temp:    SpO2: 99% 98%    Last Pain:  Vitals:   12/27/16 1050  TempSrc:   PainSc: 7                  Molli Barrows

## 2016-12-27 NOTE — Anesthesia Preprocedure Evaluation (Signed)
Anesthesia Evaluation  Patient identified by MRN, date of birth, ID band Patient awake    Reviewed: Allergy & Precautions, H&P , NPO status , Patient's Chart, lab work & pertinent test results, reviewed documented beta blocker date and time   Airway Mallampati: II   Neck ROM: full    Dental  (+) Poor Dentition   Pulmonary neg pulmonary ROS, asthma , COPD,    Pulmonary exam normal        Cardiovascular Exercise Tolerance: Good hypertension, On Medications + Peripheral Vascular Disease  negative cardio ROS Normal cardiovascular exam Rhythm:regular Rate:Normal     Neuro/Psych  Headaches,  Neuromuscular disease negative neurological ROS  negative psych ROS   GI/Hepatic negative GI ROS, Neg liver ROS, GERD  Medicated,  Endo/Other  negative endocrine ROSdiabetes  Renal/GU Renal diseasenegative Renal ROS  negative genitourinary   Musculoskeletal   Abdominal   Peds  Hematology negative hematology ROS (+)   Anesthesia Other Findings Past Medical History: No date: Arrhythmia No date: Cyst of kidney, acquired No date: Diabetes mellitus without complication (HCC)     Comment:  type 2 No date: History of chicken pox No date: History of measles as a child No date: Hyperlipidemia No date: Hypertension No date: Irregular heart beat Past Surgical History: No date: Cardiac Catherization     Comment:  [facility] No date: CARDIAC CATHETERIZATION     Comment:  [facility] No date: Ligament Removal; Left     Comment:  of left thumb: dr. Cleda Mccreedy No date: ligament removal  of left thumb     Comment:  Dr. Cleda Mccreedy 06/23/2014: [place] (ARMX HX)     Comment:  [place]. Normal BMI    Body Mass Index:  31.76 kg/m     Reproductive/Obstetrics negative OB ROS                             Anesthesia Physical Anesthesia Plan  ASA: III  Anesthesia Plan: General   Post-op Pain Management:     Induction:   PONV Risk Score and Plan:   Airway Management Planned:   Additional Equipment:   Intra-op Plan:   Post-operative Plan:   Informed Consent: I have reviewed the patients History and Physical, chart, labs and discussed the procedure including the risks, benefits and alternatives for the proposed anesthesia with the patient or authorized representative who has indicated his/her understanding and acceptance.   Dental Advisory Given  Plan Discussed with: CRNA  Anesthesia Plan Comments:         Anesthesia Quick Evaluation

## 2016-12-27 NOTE — Transfer of Care (Signed)
Immediate Anesthesia Transfer of Care Note  Patient: [name].  Procedure(s) Performed: ESOPHAGOGASTRODUODENOSCOPY (EGD) WITH PROPOFOL (N/A )  Patient Location: PACU  Anesthesia Type:General  Level of Consciousness: drowsy and patient cooperative  Airway & Oxygen Therapy: Patient Spontanous Breathing and Patient connected to nasal cannula oxygen  Post-op Assessment: Report given to RN and Post -op Vital signs reviewed and stable  Post vital signs: Reviewed and stable  Last Vitals:  Vitals:   12/27/16 0901 12/27/16 1030  BP: 135/73 132/83  Pulse: (!) 57 65  Resp: 20 (!) 33  Temp: (!) 36.2 C   SpO2: 100% 99%    Last Pain:  Vitals:   12/27/16 0901  TempSrc: Tympanic  PainSc: 10-Worst pain ever      Patients Stated Pain Goal: 0 (66/44/03 [id number])  Complications: No apparent anesthesia complications. Complaining of abdominal pain, turned onto left side with some relief, Dr. Marius Ditch called to bedside to assess abdomen and states that abdomen is unchanged from preop assessment and there was food present in stomach and she would not be surprised if N/V.

## 2016-12-27 NOTE — H&P (Signed)
Alex Darby, MD [address]  [place]  [place], [place] [address]  Main: [phone]  Fax: [phone] Pager: [phone]  Primary Care Physician:  Birdie Sons, MD Primary Gastroenterologist:  Dr. Cephas Rogers  Pre-Procedure History & Physical: HPI:  Alex Dondero. is a 55 y.o. male is here for an endoscopy.   Past Medical History:  Diagnosis Date  . Arrhythmia   . Cyst of kidney, acquired   . Diabetes mellitus without complication ([place])    type 2  . History of chicken pox   . History of measles as a child   . Hyperlipidemia   . Hypertension   . Irregular heart beat     Past Surgical History:  Procedure Laterality Date  . Cardiac Catherization     [facility]  . CARDIAC CATHETERIZATION     [facility]  . Ligament Removal Left    of left thumb: dr. Cleda Mccreedy  . ligament removal  of left thumb     Dr. Cleda Mccreedy  . NM GATED MYOCARDIAL STUDY (ARMX HX)  06/23/2014   Paraschos. Normal    Prior to Admission medications   Medication Sig Start Date End Date Taking? Authorizing Provider  Cholecalciferol (VITAMIN D3) 50000 units CAPS Take 50,000 Units by mouth every 30 (thirty) days. 08/22/15  Yes Birdie Sons, MD  famotidine (PEPCID) 20 MG tablet Take 1 tablet (20 mg total) by mouth 2 (two) times daily. 11/28/15  Yes Carrie Mew, MD  FLOVENT DISKUS 100 MCG/BLIST AEPB inhale 1 puff by mouth twice a day Rinse mouth after use 03/21/16  Yes Birdie Sons, MD  glipiZIDE (GLUCOTROL XL) 2.5 MG 24 hr tablet take 1 tablet by mouth every morning 11/16/16  Yes Birdie Sons, MD  glucose blood (ONE TOUCH ULTRA TEST) test strip 1 each by Other route daily. for testing 10/12/16  Yes Birdie Sons, MD  HAVRIX 1440 EL U/ML injection  09/26/16  Yes [provider]  lisinopril (PRINIVIL,ZESTRIL) 20 MG tablet take 1 tablet by mouth once daily 09/24/16  Yes Birdie Sons, MD  metFORMIN (GLUCOPHAGE-XR) 500 MG 24 hr tablet take 2 tablets by mouth twice a day before meals  09/24/16  Yes Birdie Sons, MD  montelukast (SINGULAIR) 10 MG tablet take 1 tablet by mouth at bedtime 11/16/16  Yes Birdie Sons, MD  naproxen (NAPROSYN) 500 MG tablet Take 1 tablet (500 mg total) by mouth 2 (two) times daily with a meal. 08/23/16  Yes Birdie Sons, MD  omeprazole (PRILOSEC) 20 MG capsule take 1 capsule by mouth once daily 12/12/16  Yes Birdie Sons, MD  ondansetron (ZOFRAN ODT) 4 MG disintegrating tablet Take 1 tablet (4 mg total) by mouth every 8 (eight) hours as needed for nausea or vomiting. 11/28/15  Yes Carrie Mew, MD  [facility] DELICA LANCETS 35H MISC TEST once daily 09/24/16  Yes Birdie Sons, MD  [facility] DELICA LANCETS FINE MISC TEST once daily 04/23/16  Yes Birdie Sons, MD  PARoxetine (PAXIL) 20 MG tablet Take 1 tablet (20 mg total) by mouth daily. 05/21/16  Yes Birdie Sons, MD  PROAIR HFA 108 [phone] Base) MCG/ACT inhaler inhale 2 puffs by mouth every 6 hours if needed for wheezing 10/17/16  Yes Birdie Sons, MD  Propylene Glycol (SYSTANE BALANCE) 0.6 % SOLN Apply to eye.   Yes [provider]  simvastatin (ZOCOR) 20 MG tablet Take 1 tablet (20 mg total) by mouth daily. 11/12/16  Yes Birdie Sons, MD  tiZANidine (ZANAFLEX) 4 MG tablet take 1 tablet by mouth every 6 hours if needed for headache 10/08/16  Yes Birdie Sons, MD  triamcinolone cream (KENALOG) 0.5 % apply to affected area twice a day 12/05/14  Yes Birdie Sons, MD  Vitamin D, Ergocalciferol, (DRISDOL) 50000 units CAPS capsule Take 1 capsule (50,000 Units total) by mouth every 7 (seven) days. 05/16/16  Yes Birdie Sons, MD    Allergies as of 11/26/2016 - Review Complete 10/31/2016  Allergen Reaction Noted  . Bee venom Hives 10/22/2014  . Other  10/09/2014  . Sulfa antibiotics  01/04/2014    Family History  Problem Relation Age of Onset  . Cancer Mother        throat  . Diabetes Father   . Heart disease Father   . Cancer Maternal Grandmother   . Cancer  Maternal Grandfather        pancreatic  . Cancer Paternal Grandfather     Social History   Social History  . Marital status: Married    Spouse name: N/A  . Number of children: N/A  . Years of education: N/A   Occupational History  . Disabled    Social History Main Topics  . Smoking status: Never Smoker  . Smokeless tobacco: Never Used  . Alcohol use No  . Drug use: No  . Sexual activity: Not on file   Other Topics Concern  . Not on file   Social History Narrative   ** Merged History Encounter **       ** Data from: 10/22/14 Enc Dept: [facility]       ** Data from: 10/07/14 Enc Dept: [facility]   Arrest: Arrested in 2012 for indecent liberties, required to wear ankle monitor    Review of Systems: See HPI, otherwise negative ROS  Physical Exam: BP 135/73   Pulse (!) 57   Temp (!) 97.1 F (36.2 C) (Tympanic)   Resp 20   Ht 5' 4" (1.626 m)   Wt 185 lb (83.9 kg)   SpO2 100%   BMI 31.76 kg/m  General:   Alert,  pleasant and cooperative in NAD Head:  Normocephalic and atraumatic. Neck:  Supple; no masses or thyromegaly. Lungs:  Clear throughout to auscultation.    Heart:  Regular rate and rhythm. Abdomen:  Soft, nontender and nondistended. Normal bowel sounds, without guarding, and without rebound.   Neurologic:  Alert and  oriented x4;  grossly normal neurologically.  Impression/Plan: Alex Metro. is here for an endoscopy to be performed for RUQ pain  Risks, benefits, limitations, and alternatives regarding  endoscopy have been reviewed with the patient.  Questions have been answered.  All parties agreeable.   Sherri Sear, MD  12/27/2016, 9:07 AM

## 2016-12-28 ENCOUNTER — Other Ambulatory Visit: Payer: Self-pay

## 2016-12-28 ENCOUNTER — Telehealth: Payer: Self-pay

## 2016-12-28 ENCOUNTER — Encounter: Payer: Self-pay | Admitting: Gastroenterology

## 2016-12-28 DIAGNOSIS — K3184 Gastroparesis: Secondary | ICD-10-CM

## 2016-12-28 NOTE — Telephone Encounter (Signed)
Patients wife has been given the appt for her husbands Gastric Emptying Study.  It has been scheduled for Saturday October 20th at 10am.  Nothing to eat or drink after midnight, hold stomach meds 6 hours prior.  I've provided patients wife phone number to reschedule if needed.  Thanks [employer]

## 2016-12-31 LAB — SURGICAL PATHOLOGY

## 2017-01-02 ENCOUNTER — Encounter: Payer: Self-pay | Admitting: General Surgery

## 2017-01-02 ENCOUNTER — Telehealth: Payer: Self-pay | Admitting: Gastroenterology

## 2017-01-02 DIAGNOSIS — R1011 Right upper quadrant pain: Secondary | ICD-10-CM

## 2017-01-02 NOTE — Telephone Encounter (Signed)
Patients wife has been informed the following message:   Pathology is normal. We will refer to general surgery for cholecystectomy, Dx: chronic RUQ pain and multiple gall bladder polyps with restricted mobility on US exam.  Thanks Sharyn Lull

## 2017-01-02 NOTE — Telephone Encounter (Signed)
Patients wife called for results.

## 2017-01-05 ENCOUNTER — Ambulatory Visit: Payer: Medicare Other

## 2017-01-14 ENCOUNTER — Ambulatory Visit: Payer: Self-pay | Admitting: General Surgery

## 2017-01-15 ENCOUNTER — Other Ambulatory Visit: Payer: Self-pay | Admitting: Family Medicine

## 2017-01-15 DIAGNOSIS — Z794 Long term (current) use of insulin: Principal | ICD-10-CM

## 2017-01-15 DIAGNOSIS — E119 Type 2 diabetes mellitus without complications: Secondary | ICD-10-CM

## 2017-02-13 ENCOUNTER — Other Ambulatory Visit: Payer: Self-pay | Admitting: Family Medicine

## 2017-03-25 ENCOUNTER — Telehealth: Payer: Self-pay | Admitting: Gastroenterology

## 2017-03-25 ENCOUNTER — Ambulatory Visit
Admission: RE | Admit: 2017-03-25 | Discharge: 2017-03-25 | Disposition: A | Discharge status: Home / Self Care | Payer: Medicare Other | Source: Ambulatory Visit | Attending: Gastroenterology | Admitting: Gastroenterology

## 2017-03-25 DIAGNOSIS — R1011 Right upper quadrant pain: Secondary | ICD-10-CM | POA: Diagnosis not present

## 2017-03-25 DIAGNOSIS — K3184 Gastroparesis: Secondary | ICD-10-CM | POA: Insufficient documentation

## 2017-03-25 DIAGNOSIS — E119 Type 2 diabetes mellitus without complications: Secondary | ICD-10-CM | POA: Insufficient documentation

## 2017-03-25 LAB — GLUCOSE, CAPILLARY
GLUCOSE-CAPILLARY: 181 mg/dL — AB (ref 65–99)
GLUCOSE-CAPILLARY: 177 mg/dL — AB (ref 65–99)

## 2017-03-25 MED ORDER — TECHNETIUM TC 99M SULFUR COLLOID
2.2000 | Freq: Once | INTRAVENOUS | Status: AC | PRN
  Administered 2017-03-25: 2.2 via ORAL

## 2017-03-25 NOTE — Telephone Encounter (Signed)
results

## 2017-04-16 ENCOUNTER — Encounter: Payer: Self-pay | Admitting: *Deleted

## 2017-04-18 ENCOUNTER — Ambulatory Visit: Payer: Self-pay | Admitting: General Surgery

## 2017-04-23 ENCOUNTER — Ambulatory Visit (INDEPENDENT_AMBULATORY_CARE_PROVIDER_SITE_OTHER): Payer: Medicare Other | Admitting: General Surgery

## 2017-04-23 ENCOUNTER — Encounter: Payer: Self-pay | Admitting: General Surgery

## 2017-04-23 ENCOUNTER — Telehealth: Payer: Self-pay | Admitting: Family Medicine

## 2017-04-23 VITALS — BP 130/82 | HR 59 | Resp 18 | Ht 64.0 in | Wt 187.0 lb

## 2017-04-23 DIAGNOSIS — N12 Tubulo-interstitial nephritis, not specified as acute or chronic: Secondary | ICD-10-CM | POA: Diagnosis not present

## 2017-04-23 DIAGNOSIS — R109 Unspecified abdominal pain: Secondary | ICD-10-CM | POA: Diagnosis not present

## 2017-04-23 DIAGNOSIS — K824 Cholesterolosis of gallbladder: Secondary | ICD-10-CM

## 2017-04-23 NOTE — Patient Instructions (Addendum)
The patient is aware to call back for any questions or new concerns.  The patient is scheduled for a CT abdomen pelvis with contrast at [facility] outpatient imaging on 05/03/17 at 9:00 am. He is to arrive by 8:45 am and have nothing to eat or drink for 4 hours prior. He will pick up a prep kit today. The patient is aware of date, time, and instructions.

## 2017-04-23 NOTE — Progress Notes (Signed)
Patient ID: [name]., male   DOB: 1960-08-06, 57 y.o.   MRN: [id number]  Chief Complaint  Patient presents with  . Other    HPI Dhiren Azimi. is a 57 y.o. male here today for a evaluation gastric polyps and gall bladder polys referred by Dr Marius Ditch. His wife states that Dr Caryn Section told her he is having gall bladder problems. He admits to having chronic constant right side abdominal pain for 8 months. The pain is sharp, constant without increase with meals.  No history of weight loss.  No dietary intolerance..  Followed by Dr Abigail Butts for kidney cyst. Gastric emptying study was 03-25-17. Negative cologuard March 2018. 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  Here today with his wife of 8 years, [name].  By history the patient's been diabetic for 5-6 years.  History of pancreatitis in the past.   .   HPI  Past Medical History:  Diagnosis Date  . Arrhythmia   . Cyst of kidney, acquired   . Diabetes mellitus without complication ([place]) 3016   type 2  . GERD (gastroesophageal reflux disease)   . History of chicken pox   . History of measles as a child   . History of PKU   . Hyperlipidemia   . Hypertension   . IBS (irritable bowel syndrome)   . Irregular heart beat   . Mentally challenged   . Pancreatitis     Past Surgical History:  Procedure Laterality Date  . Cardiac Catherization     [facility]  . CARDIAC CATHETERIZATION     [facility]  . COLONOSCOPY    . ESOPHAGOGASTRODUODENOSCOPY (EGD) WITH PROPOFOL N/A 12/27/2016   Procedure: ESOPHAGOGASTRODUODENOSCOPY (EGD) WITH PROPOFOL;  Surgeon: Lin Landsman, MD;  Location: [facility];  Service: Gastroenterology;  Laterality: N/A;  . HEMORRHOID SURGERY    . Ligament Removal Left    of left thumb: dr. Cleda Mccreedy  . ligament removal  of left thumb     Dr. Cleda Mccreedy  . NM GATED MYOCARDIAL STUDY (ARMX HX)  06/23/2014   Paraschos. Normal    Family History  Problem Relation Age of Onset   . Cancer Mother        throat  . Diabetes Father   . Heart disease Father   . Cancer Maternal Grandmother   . Cancer Maternal Grandfather        pancreatic  . Cancer Paternal Grandfather     Social History Social History   Tobacco Use  . Smoking status: Never Smoker  . Smokeless tobacco: Never Used  Substance Use Topics  . Alcohol use: No    Alcohol/week: 0.0 oz  . Drug use: No    Allergies  Allergen Reactions  . Bee Venom Hives    All kinds of bees  . Other     Certain powders  . Sulfa Antibiotics     Current Outpatient Medications  Medication Sig Dispense Refill  . Cholecalciferol (VITAMIN D3) 50000 units CAPS Take 50,000 Units by mouth every 30 (thirty) days. 12 capsule 0  . FLOVENT DISKUS 100 MCG/BLIST AEPB inhale 1 puff by mouth twice a day Rinse mouth after use 60 each 5  . glipiZIDE (GLUCOTROL XL) 2.5 MG 24 hr tablet take 1 tablet by mouth every morning before BREAKFAST 30 tablet 3  . glucose blood (ONE TOUCH ULTRA TEST) test strip 1 each by Other route daily. for testing 100 each 4  . lisinopril (PRINIVIL,ZESTRIL)  20 MG tablet take 1 tablet by mouth once daily 210 tablet 0  . metFORMIN (GLUCOPHAGE-XR) 500 MG 24 hr tablet take 2 tablets by mouth twice a day before meals 120 tablet 12  . montelukast (SINGULAIR) 10 MG tablet take 1 tablet by mouth at bedtime 30 tablet 5  . naproxen (NAPROSYN) 500 MG tablet Take 1 tablet (500 mg total) by mouth 2 (two) times daily with a meal. 30 tablet 2  . omeprazole (PRILOSEC) 20 MG capsule take 1 capsule by mouth once daily 30 capsule 5  . ONETOUCH DELICA LANCETS 95K MISC TEST once daily 100 each 3  . ONETOUCH DELICA LANCETS FINE MISC TEST once daily 100 each 3  . PARoxetine (PAXIL) 20 MG tablet Take 1 tablet (20 mg total) by mouth daily. 30 tablet 1  . PROAIR HFA 108 (90 Base) MCG/ACT inhaler inhale 2 puffs by mouth every 6 hours if needed for wheezing 8.5 g 3  . simvastatin (ZOCOR) 20 MG tablet Take 1 tablet (20 mg total) by  mouth daily. 30 tablet 6  . triamcinolone cream (KENALOG) 0.5 % apply to affected area twice a day 30 g 5   No current facility-administered medications for this visit.     Review of Systems Review of Systems  Constitutional: Negative.   Respiratory: Negative.   Cardiovascular: Negative.   Gastrointestinal: Negative for constipation and diarrhea.    Blood pressure 130/82, pulse (!) 59, resp. rate 18, height 5' 4" (1.626 m), weight 187 lb (84.8 kg).  Physical Exam Physical Exam  Constitutional: He is oriented to person, place, and time. He appears well-developed and well-nourished.  HENT:  Mouth/Throat: Oropharynx is clear and moist.  Eyes: Conjunctivae are normal. No scleral icterus.  Neck: Neck supple.  Cardiovascular: Normal rate, regular rhythm and normal heart sounds.  No lower leg edema  Pulmonary/Chest: Effort normal and breath sounds normal.  Abdominal: Soft. Normal appearance. There is tenderness.    Lymphadenopathy:    He has no cervical adenopathy.  Neurological: He is alert and oriented to person, place, and time.  Skin: Skin is warm and dry.  Psychiatric: His behavior is normal.    Data Reviewed Dominant ultrasound dated February 11, 2009 showed a 2.72 cm simple cyst involving the left kidney.  Fatty infiltration of the liver.  No gallstones.  Abdominal ultrasound dated December 26, 2012 showed a near 5 mm polyp in the gallbladder.  Left renal cyst measuring up to 3.24 cm.  Hepatic steatosis.  Abdominal ultrasound dated October 26, 2014: 5 mm nonmobile echogenic foci within the gallbladder wall, adjacent lesions noted.  3.4 cm left renal simple cyst.  Abdominal ultrasound dated September 21, 2016.  Multiple gallbladder polyps noted.  No definite stones.  Common bile duct 3 mm.  Fatty infiltration suspected, stable.  Gastric emptying exam dated March 25, 2017: Normal.  Upper endoscopy dated December 27, 2016: Gastric polyps (benign fundic gland polyps on biopsy).   Otherwise unremarkable exam.  CT scan of the abdomen and pelvis dated January 20, 2013 reviewed.  Left renal cyst.  Normal intra-abdominal vasculature.  No mass lesions.  Pancreas was normal. . Assessment    22-monthhistory of right flank pain, worse with physical activity.  No clear dietary correlate.    Plan    While the patient's wife reports he has a history of pancreatitis, this cannot be documented in the medical record.  Pancreatitis pain typically not related to the lateral abdominal wall but more central in nature.  No evidence of the gallbladder as the source of abdominal pain.  Long-standing history of gallbladder polyps dating back to 2014 at a minimum.  Negative Cologuard exam.  The patient reported having "20 colonoscopies" at [facility].  This cannot be confirmed on record review.  At present, no clear indication of an intra-abdominal source for the patient's right lateral abdominal wall pain.  CT scans been recommended to confirm no new pathology from 2014 exam.  The patient and his wife were advised that my suspicion for intra-abdominal pathology is very small.  No indication for colonoscopy at this time in light of his negative Cologuard test and absence of lower GI symptoms.  If the scan shows no more than what is already known, fatty liver, left renal cyst, follow-up here will be on an as-needed basis.   HPI, Physical Exam, Assessment and Plan have been scribed under the direction and in the presence of Robert Bellow, MD. Karie Fetch, RN  I have completed the exam and reviewed the above documentation for accuracy and completeness.  I agree with the above.  Haematologist has been used and any errors in dictation or transcription are unintentional.  Hervey Ard, M.D., F.A.C.S.  Forest Gleason Byrnett 04/23/2017, 9:54 AM

## 2017-04-24 NOTE — Telephone Encounter (Signed)
Visit scheduled

## 2017-05-03 ENCOUNTER — Ambulatory Visit
Admission: RE | Admit: 2017-05-03 | Discharge: 2017-05-03 | Disposition: A | Discharge status: Home / Self Care | Payer: Medicare Other | Source: Ambulatory Visit | Attending: General Surgery | Admitting: General Surgery

## 2017-05-03 ENCOUNTER — Telehealth: Payer: Self-pay

## 2017-05-03 DIAGNOSIS — K824 Cholesterolosis of gallbladder: Secondary | ICD-10-CM

## 2017-05-03 DIAGNOSIS — R109 Unspecified abdominal pain: Secondary | ICD-10-CM | POA: Insufficient documentation

## 2017-05-03 DIAGNOSIS — K760 Fatty (change of) liver, not elsewhere classified: Secondary | ICD-10-CM | POA: Diagnosis not present

## 2017-05-03 DIAGNOSIS — N281 Cyst of kidney, acquired: Secondary | ICD-10-CM | POA: Diagnosis not present

## 2017-05-03 DIAGNOSIS — M4328 Fusion of spine, sacral and sacrococcygeal region: Secondary | ICD-10-CM | POA: Diagnosis not present

## 2017-05-03 LAB — POCT I-STAT CREATININE: Creatinine, Ser: 1.1 mg/dL (ref 0.61–1.24)

## 2017-05-03 MED ORDER — IOPAMIDOL (ISOVUE-300) INJECTION 61%
100.0000 mL | Freq: Once | INTRAVENOUS | Status: AC | PRN
  Administered 2017-05-03: 100 mL via INTRAVENOUS

## 2017-05-03 NOTE — Telephone Encounter (Signed)
-----  Message from Robert Bellow, MD sent at 05/03/2017 11:47 AM EST ----- Please notify the patient that the preliminary report on the CT shows no reason for his right sided pain.  I plan to review the films with the radiologist when I return next week, and will have a final report at that time. Thanks.

## 2017-05-03 NOTE — Telephone Encounter (Signed)
Notified patient's wife as instructed, patient pleased. Will await callback.

## 2017-05-06 ENCOUNTER — Telehealth: Payer: Self-pay

## 2017-05-06 ENCOUNTER — Telehealth: Payer: Self-pay | Admitting: *Deleted

## 2017-05-06 NOTE — Telephone Encounter (Signed)
Patient's wife called this morning and is anxious about full results of her husband's CT scan. I let her know that Dr Bary Castilla would contact them as soon as he was able to speak with the Radiologist about this.

## 2017-05-06 NOTE — Telephone Encounter (Signed)
Patient came to the office and requested a copy of his CT Report. Patient signed release and was given copy.

## 2017-05-06 NOTE — Telephone Encounter (Signed)
error

## 2017-05-06 NOTE — Telephone Encounter (Signed)
Patient called the office wanting to speak with Dr. Bary Castilla before 5 pm regarding 05-03-17 CT scan results. He was notified that Dr. Bary Castilla is in the office but seeing patients at this time.   The patient requests a call back on his cell phone.

## 2017-05-07 ENCOUNTER — Telehealth: Payer: Self-pay | Admitting: *Deleted

## 2017-05-07 NOTE — Telephone Encounter (Signed)
Notified patient wife as instructed, patient wife pleased. Discussed follow-up appointments, patient wife agrees

## 2017-05-07 NOTE — Telephone Encounter (Signed)
-----  Message from Robert Bellow, MD sent at 05/07/2017  3:11 PM EST ----- Please notify the patient that I reviewed his abdominal CT with a second radiologist. No areas of concern.  No source for right sided pain.   Should follow with PCP for assessment of back as source.  No follow up here is required.

## 2017-05-08 ENCOUNTER — Other Ambulatory Visit: Payer: Self-pay | Admitting: Family Medicine

## 2017-05-08 DIAGNOSIS — R062 Wheezing: Secondary | ICD-10-CM

## 2017-05-08 DIAGNOSIS — E119 Type 2 diabetes mellitus without complications: Secondary | ICD-10-CM

## 2017-05-08 DIAGNOSIS — Z794 Long term (current) use of insulin: Secondary | ICD-10-CM

## 2017-05-22 ENCOUNTER — Ambulatory Visit (INDEPENDENT_AMBULATORY_CARE_PROVIDER_SITE_OTHER): Payer: Medicare Other

## 2017-05-22 ENCOUNTER — Encounter: Payer: Self-pay | Admitting: Family Medicine

## 2017-05-22 ENCOUNTER — Ambulatory Visit (INDEPENDENT_AMBULATORY_CARE_PROVIDER_SITE_OTHER): Payer: Medicare Other | Admitting: Family Medicine

## 2017-05-22 ENCOUNTER — Telehealth: Payer: Self-pay

## 2017-05-22 VITALS — BP 122/76 | HR 64 | Temp 97.7°F | Ht 64.0 in | Wt 189.6 lb

## 2017-05-22 DIAGNOSIS — E785 Hyperlipidemia, unspecified: Secondary | ICD-10-CM | POA: Diagnosis not present

## 2017-05-22 DIAGNOSIS — E119 Type 2 diabetes mellitus without complications: Secondary | ICD-10-CM | POA: Diagnosis not present

## 2017-05-22 DIAGNOSIS — K760 Fatty (change of) liver, not elsewhere classified: Secondary | ICD-10-CM | POA: Diagnosis not present

## 2017-05-22 DIAGNOSIS — Z125 Encounter for screening for malignant neoplasm of prostate: Secondary | ICD-10-CM

## 2017-05-22 DIAGNOSIS — Z794 Long term (current) use of insulin: Secondary | ICD-10-CM

## 2017-05-22 DIAGNOSIS — E559 Vitamin D deficiency, unspecified: Secondary | ICD-10-CM | POA: Diagnosis not present

## 2017-05-22 DIAGNOSIS — Z0000 Encounter for general adult medical examination without abnormal findings: Secondary | ICD-10-CM

## 2017-05-22 DIAGNOSIS — I10 Essential (primary) hypertension: Secondary | ICD-10-CM | POA: Diagnosis not present

## 2017-05-22 DIAGNOSIS — Z23 Encounter for immunization: Secondary | ICD-10-CM

## 2017-05-22 DIAGNOSIS — R079 Chest pain, unspecified: Secondary | ICD-10-CM | POA: Diagnosis not present

## 2017-05-22 MED ORDER — FLUTICASONE PROPIONATE (INHAL) 100 MCG/BLIST IN AEPB: INHALATION_SPRAY | RESPIRATORY_TRACT | 5 refills | Status: DC

## 2017-05-22 NOTE — Patient Instructions (Signed)
[name] , Thank you for taking time to come for your Medicare Wellness Visit. I appreciate your ongoing commitment to your health goals. Please review the following plan we discussed and let me know if I can assist you in the future.   Screening recommendations/referrals: Colonoscopy: Cologuard up to date Recommended yearly ophthalmology/optometry visit for glaucoma screening and checkup Recommended yearly dental visit for hygiene and checkup  Vaccinations: Influenza vaccine: Up to date Pneumococcal vaccine: N/A Tdap vaccine: Pt declines today.  Shingles vaccine: N/A    Advanced directives: Advance directive discussed with you today. Even though you declined this today please call our office should you change your mind and we can give you the proper paperwork for you to fill out.  Conditions/risks identified: Obesity- recommend increasing water intake to 4 glasses a day.   Next appointment: 10:00 AM today with Dr Caryn Section.  Preventive Care 10 Years and Older, Male Preventive care refers to lifestyle choices and visits with your health care provider that can promote health and wellness. What does preventive care include?  A yearly physical exam. This is also called an annual well check.  Dental exams once or twice a year.  Routine eye exams. Ask your health care provider how often you should have your eyes checked.  Personal lifestyle choices, including:  Daily care of your teeth and gums.  Regular physical activity.  Eating a healthy diet.  Avoiding tobacco and drug use.  Limiting alcohol use.  Practicing safe sex.  Taking low doses of aspirin every day.  Taking vitamin and mineral supplements as recommended by your health care provider. What happens during an annual well check? The services and screenings done by your health care provider during your annual well check will depend on your age, overall health, lifestyle risk factors, and family history of  disease. Counseling  Your health care provider may ask you questions about your:  Alcohol use.  Tobacco use.  Drug use.  Emotional well-being.  Home and relationship well-being.  Sexual activity.  Eating habits.  History of falls.  Memory and ability to understand (cognition).  Work and work Statistician. Screening  You may have the following tests or measurements:  Height, weight, and BMI.  Blood pressure.  Lipid and cholesterol levels. These may be checked every 5 years, or more frequently if you are over 29 years old.  Skin check.  Lung cancer screening. You may have this screening every year starting at age 20 if you have a 30-pack-year history of smoking and currently smoke or have quit within the past 15 years.  Fecal occult blood test (FOBT) of the stool. You may have this test every year starting at age 85.  Flexible sigmoidoscopy or colonoscopy. You may have a sigmoidoscopy every 5 years or a colonoscopy every 10 years starting at age 77.  Prostate cancer screening. Recommendations will vary depending on your family history and other risks.  Hepatitis C blood test.  Hepatitis B blood test.  Sexually transmitted disease (STD) testing.  Diabetes screening. This is done by checking your blood sugar (glucose) after you have not eaten for a while (fasting). You may have this done every 1-3 years.  Abdominal aortic aneurysm (AAA) screening. You may need this if you are a current or former smoker.  Osteoporosis. You may be screened starting at age 68 if you are at high risk. Talk with your health care provider about your test results, treatment options, and if necessary, the need for more tests.  Vaccines  Your health care provider may recommend certain vaccines, such as:  Influenza vaccine. This is recommended every year.  Tetanus, diphtheria, and acellular pertussis (Tdap, Td) vaccine. You may need a Td booster every 10 years.  Zoster vaccine. You may  need this after age 48.  Pneumococcal 13-valent conjugate (PCV13) vaccine. One dose is recommended after age 28.  Pneumococcal polysaccharide (PPSV23) vaccine. One dose is recommended after age 23. Talk to your health care provider about which screenings and vaccines you need and how often you need them. This information is not intended to replace advice given to you by your health care provider. Make sure you discuss any questions you have with your health care provider. Document Released: 04/01/2015 Document Revised: 11/23/2015 Document Reviewed: 01/04/2015 Elsevier Interactive Patient Education  2017 [place] Prevention in the Home Falls can cause injuries. They can happen to people of all ages. There are many things you can do to make your home safe and to help prevent falls. What can I do on the outside of my home?  Regularly fix the edges of walkways and driveways and fix any cracks.  Remove anything that might make you trip as you walk through a door, such as a raised step or threshold.  Trim any bushes or trees on the path to your home.  Use bright outdoor lighting.  Clear any walking paths of anything that might make someone trip, such as rocks or tools.  Regularly check to see if handrails are loose or broken. Make sure that both sides of any steps have handrails.  Any raised decks and porches should have guardrails on the edges.  Have any leaves, snow, or ice cleared regularly.  Use sand or salt on walking paths during winter.  Clean up any spills in your garage right away. This includes oil or grease spills. What can I do in the bathroom?  Use night lights.  Install grab bars by the toilet and in the tub and shower. Do not use towel bars as grab bars.  Use non-skid mats or decals in the tub or shower.  If you need to sit down in the shower, use a plastic, non-slip stool.  Keep the floor dry. Clean up any water that spills on the floor as soon as it  happens.  Remove soap buildup in the tub or shower regularly.  Attach bath mats securely with double-sided non-slip rug tape.  Do not have throw rugs and other things on the floor that can make you trip. What can I do in the bedroom?  Use night lights.  Make sure that you have a light by your bed that is easy to reach.  Do not use any sheets or blankets that are too big for your bed. They should not hang down onto the floor.  Have a firm chair that has side arms. You can use this for support while you get dressed.  Do not have throw rugs and other things on the floor that can make you trip. What can I do in the kitchen?  Clean up any spills right away.  Avoid walking on wet floors.  Keep items that you use a lot in easy-to-reach places.  If you need to reach something above you, use a strong step stool that has a grab bar.  Keep electrical cords out of the way.  Do not use floor polish or wax that makes floors slippery. If you must use wax, use non-skid floor wax.  Do not have throw rugs and other things on the floor that can make you trip. What can I do with my stairs?  Do not leave any items on the stairs.  Make sure that there are handrails on both sides of the stairs and use them. Fix handrails that are broken or loose. Make sure that handrails are as long as the stairways.  Check any carpeting to make sure that it is firmly attached to the stairs. Fix any carpet that is loose or worn.  Avoid having throw rugs at the top or bottom of the stairs. If you do have throw rugs, attach them to the floor with carpet tape.  Make sure that you have a light switch at the top of the stairs and the bottom of the stairs. If you do not have them, ask someone to add them for you. What else can I do to help prevent falls?  Wear shoes that:  Do not have high heels.  Have rubber bottoms.  Are comfortable and fit you well.  Are closed at the toe. Do not wear sandals.  If you  use a stepladder:  Make sure that it is fully opened. Do not climb a closed stepladder.  Make sure that both sides of the stepladder are locked into place.  Ask someone to hold it for you, if possible.  Clearly mark and make sure that you can see:  Any grab bars or handrails.  First and last steps.  Where the edge of each step is.  Use tools that help you move around (mobility aids) if they are needed. These include:  Canes.  Walkers.  Scooters.  Crutches.  Turn on the lights when you go into a dark area. Replace any light bulbs as soon as they burn out.  Set up your furniture so you have a clear path. Avoid moving your furniture around.  If any of your floors are uneven, fix them.  If there are any pets around you, be aware of where they are.  Review your medicines with your doctor. Some medicines can make you feel dizzy. This can increase your chance of falling. Ask your doctor what other things that you can do to help prevent falls. This information is not intended to replace advice given to you by your health care provider. Make sure you discuss any questions you have with your health care provider. Document Released: 12/30/2008 Document Revised: 08/11/2015 Document Reviewed: 04/09/2014 Elsevier Interactive Patient Education  2017 [employer].

## 2017-05-22 NOTE — Progress Notes (Signed)
Subjective:   [name]. is a 57 y.o. male who presents for Medicare Annual/Subsequent preventive examination.  Review of Systems:  N/A  Cardiac Risk Factors include: diabetes mellitus;male gender;obesity (BMI >30kg/m2)     Objective:    Vitals: BP 122/76 (BP Location: Left Arm)   Pulse 64   Temp 97.7 F (36.5 C) (Oral)   Ht 5' 4" (1.626 m)   Wt 189 lb 9.6 oz (86 kg)   BMI 32.54 kg/m   Body mass index is 32.54 kg/m.  Advanced Directives 05/22/2017 12/27/2016 05/15/2016 11/27/2015  Does Patient Have a Medical Advance Directive? No No No No  Would patient like information on creating a medical advance directive? No - Patient declined - No - Patient declined -    Tobacco Social History   Tobacco Use  Smoking Status Never Smoker  Smokeless Tobacco Never Used     Counseling given: Not Answered   Clinical Intake:  Pre-visit preparation completed: Yes  Pain : 0-10 Pain Score: 9  Pain Location: Abdomen Pain Orientation: Right, Lower Pain Descriptors / Indicators: Constant     Nutritional Status: BMI > 30  Obese Nutritional Risks: None Diabetes: Yes(type 2) CBG done?: No Did pt. bring in CBG monitor from home?: No  How often do you need to have someone help you when you read instructions, pamphlets, or other written materials from your doctor or pharmacy?: 1 - Never  Interpreter Needed?: No  Information entered by :: [facility], LPN  Past Medical History:  Diagnosis Date  . Arrhythmia   . Asthma   . Cyst of kidney, acquired   . Diabetes mellitus without complication ([place]) 0254   type 2  . GERD (gastroesophageal reflux disease)   . History of chicken pox   . History of measles as a child   . History of PKU   . Hyperlipidemia   . Hypertension   . IBS (irritable bowel syndrome)   . Irregular heart beat   . Mentally challenged   . Pancreatitis    Past Surgical History:  Procedure Laterality Date  . Cardiac Catherization     [facility]  .  CARDIAC CATHETERIZATION     [facility]  . COLONOSCOPY    . ESOPHAGOGASTRODUODENOSCOPY (EGD) WITH PROPOFOL N/A 12/27/2016   Procedure: ESOPHAGOGASTRODUODENOSCOPY (EGD) WITH PROPOFOL;  Surgeon: Lin Landsman, MD;  Location: [place];  Service: Gastroenterology;  Laterality: N/A;  . HEMORRHOID SURGERY    . Ligament Removal Left    of left thumb: dr. Cleda Mccreedy  . ligament removal  of left thumb     Dr. Cleda Mccreedy  . NM GATED MYOCARDIAL STUDY (ARMX HX)  06/23/2014   [place]. Normal   Family History  Problem Relation Age of Onset  . Cancer Mother        throat  . Diabetes Father   . Heart disease Father   . Cancer Maternal Grandmother   . Cancer Maternal Grandfather        pancreatic  . Cancer Paternal Grandfather    Social History   Socioeconomic History  . Marital status: Married    Spouse name: None  . Number of children: 0  . Years of education: None  . Highest education level: 12th grade  Social Needs  . Financial resource strain: Not hard at all  . Food insecurity - worry: Sometimes true  . Food insecurity - inability: Sometimes true  . Transportation needs - medical: No  . Transportation needs - non-medical: No  Occupational History  . Occupation: Disabled  Tobacco Use  . Smoking status: Never Smoker  . Smokeless tobacco: Never Used  Substance and Sexual Activity  . Alcohol use: No    Alcohol/week: 0.0 oz  . Drug use: No  . Sexual activity: None  Other Topics Concern  . None  Social History Narrative   ** Merged History Encounter **       ** Data from: 10/22/14 Enc Dept: [facility]       ** Data from: 10/07/14 Enc Dept: [facility]   Arrest: Arrested in 2012 for indecent liberties, required to wear ankle monitor    Outpatient Encounter Medications as of 05/22/2017  Medication Sig  . Cholecalciferol (VITAMIN D3) 50000 units CAPS Take 50,000 Units by mouth every 30 (thirty) days. (Patient taking differently: Take 50,000 Units by mouth once a  week. )  . FLOVENT DISKUS 100 MCG/BLIST AEPB inhale 1 puff by mouth twice a day Rinse mouth after use  . glipiZIDE (GLUCOTROL XL) 2.5 MG 24 hr tablet TAKE 1 TABLET BY MOUTH ONCE EVERY MORNING BEFORE BREAKFAST.  [name] glucose blood (ONE TOUCH ULTRA TEST) test strip 1 each by Other route daily. for testing  . lisinopril (PRINIVIL,ZESTRIL) 20 MG tablet take 1 tablet by mouth once daily  . metFORMIN (GLUCOPHAGE-XR) 500 MG 24 hr tablet take 2 tablets by mouth twice a day before meals  . montelukast (SINGULAIR) 10 MG tablet TAKE 1 TABLET BY MOUTH ONCE EVERY EVENING AT BEDTIME  . naproxen (NAPROSYN) 500 MG tablet Take 1 tablet (500 mg total) by mouth 2 (two) times daily with a meal.  . omeprazole (PRILOSEC) 20 MG capsule take 1 capsule by mouth once daily  . ONETOUCH DELICA LANCETS 60F MISC TEST once daily  . ONETOUCH DELICA LANCETS FINE MISC TEST once daily  . PARoxetine (PAXIL) 20 MG tablet Take 1 tablet (20 mg total) by mouth daily.  [name] PROAIR HFA 108 (90 Base) MCG/ACT inhaler inhale 2 puffs by mouth every 6 hours if needed for wheezing  . simvastatin (ZOCOR) 20 MG tablet Take 1 tablet (20 mg total) by mouth daily.  [name] tiZANidine (ZANAFLEX) 4 MG capsule Take 4 mg by mouth 3 (three) times daily as needed for muscle spasms.  [name] triamcinolone cream (KENALOG) 0.5 % apply to affected area twice a day   No facility-administered encounter medications on file as of 05/22/2017.     Activities of Daily Living In your present state of health, do you have any difficulty performing the following activities: 05/22/2017  Hearing? N  Vision? Y  Comment Has an eye apt coming up.   Difficulty concentrating or making decisions? N  Walking or climbing stairs? Y  Comment Due to pain in knees.   Dressing or bathing? N  Doing errands, shopping? N  Preparing Food and eating ? N  Using the Toilet? N  In the past six months, have you accidently leaked urine? N  Do you have problems with loss of bowel control? N  Managing  your Medications? Y  Comment Wife takes care of this.  Managing your Finances? Y  Comment Wife takes care of this.   Housekeeping or managing your Housekeeping? N  Some recent data might be hidden    Patient Care Team: Birdie Sons, MD as PCP - General (Family Medicine) Anell Barr, OD (Optometry) Lin Landsman, MD as Consulting Physician (Gastroenterology) Bary Castilla Forest Gleason, MD as Consulting Physician (General Surgery)   Assessment:  This is a routine wellness examination for [name].  Exercise Activities and Dietary recommendations Current Exercise Habits: The patient does not participate in regular exercise at present, Exercise limited by: orthopedic condition(s)  Goals    . DIET - INCREASE WATER INTAKE     Recommend increasing water intake to 4 glasses a day.        Fall Risk Fall Risk  05/22/2017 05/15/2016  Falls in the past year? Yes Yes  Number falls in past yr: 2 or more 2 or more  Injury with Fall? No No  Follow up Falls prevention discussed Falls prevention discussed   Is the patient's home free of loose throw rugs in walkways, pet beds, electrical cords, etc?   yes      Grab bars in the bathroom? no      Handrails on the stairs?  yes      Adequate lighting?   yes  Timed Get Up and Go Performed: N/A  Depression Screen PHQ 2/9 Scores 05/22/2017 05/22/2017 05/15/2016 05/15/2016  PHQ - 2 Score _0 PHQ- 9 Score 5 - 12 -    Cognitive Function     6CIT Screen 05/15/2016  What Year? 0 points  What month? 3 points  What time? 3 points  Count back from 20 0 points  Months in reverse 0 points  Repeat phrase 10 points  Total Score 16    Immunization History  Administered Date(s) Administered  . Hepatitis A, Adult 09/26/2016  . Hepatitis B, adult 09/26/2016  . Influenza,inj,Quad PF,6+ Mos 12/22/2012, 12/21/2013  . Influenza-Unspecified 11/30/2014, 11/18/2015  . Pneumococcal Polysaccharide-23 12/22/2012, 11/18/2015    Qualifies for Shingles  Vaccine? N/A  Screening Tests Health Maintenance  Topic Date Due  . TETANUS/TDAP  02/09/1982  . OPHTHALMOLOGY EXAM  06/21/2016  . HEMOGLOBIN A1C  02/21/2017  . FOOT EXAM  04/26/2017  . PNEUMOCOCCAL POLYSACCHARIDE VACCINE (2) 12/22/2017  . Fecal DNA (Cologuard)  05/30/2019  . INFLUENZA VACCINE  Completed  . Hepatitis C Screening  Completed  . HIV Screening  Completed   Cancer Screenings: Lung: Low Dose CT Chest recommended if Age 66-80 years, 30 pack-year currently smoking OR have quit w/in 15years. Patient does not qualify. Colorectal: Cologuard up to date, due 05/30/19   Additional Screenings:  HIV: Up to date Hepatitis C Screening: Up to date    Plan:  I have personally reviewed and addressed the Medicare Annual Wellness questionnaire and have noted the following in the patient's chart:  A. Medical and social history B. Use of alcohol, tobacco or illicit drugs  C. Current medications and supplements D. Functional ability and status E.  Nutritional status F.  Physical activity G. Advance directives H. List of other physicians I.  Hospitalizations, surgeries, and ER visits in previous 12 months J.  [place] such as hearing and vision if needed, cognitive and depression L. Referrals and appointments - none  In addition, I have reviewed and discussed with patient certain preventive protocols, quality metrics, and best practice recommendations. A written personalized care plan for preventive services as well as general preventive health recommendations were provided to patient.  See attached scanned questionnaire for additional information.   Signed,  Fabio Neighbors, LPN Nurse Health Advisor   Nurse Recommendations: Pt needs a diabetic foot exam and his Hgb A1c checked today. Pt declined the tetanus vaccine today. Pt has an upcoming eye exam, requested records once completed.

## 2017-05-22 NOTE — Progress Notes (Signed)
Patient: Alex Tallerico., Male    DOB: 1960-09-25, 57 y.o.   MRN: [id number] Visit Date: 05/22/2017  Today's Provider: Lelon Huh, MD   Chief Complaint  Patient presents with  . Annual Exam  . Diabetes  . Hypertension   Subjective:   Patient saw McKenzie today at 9:20 am for AWV.   Complete Physical Alex Girardin Johnston Maddocks. is a 57 y.o. male. He feels fairly well. He reports exercising rarely. He reports he is sleeping fairly well.  -----------------------------------------------------------  Diabetes Mellitus Type II, Follow-up:   Lab Results  Component Value Date   HGBA1C 6.7 08/22/2016   HGBA1C 6.6 05/15/2016   HGBA1C 7.1 11/22/2015   Last seen for diabetes 9 months ago.  Management since then includes; no changes. He reports good compliance with treatment. He is not having side effects.    Episodes of hypoglycemia? no  Most Recent Eye Exam: Is scheduled with Dr. Ellin Mayhew per patient.    ----------------------------------------------------------------   Hypertension, follow-up:  BP Readings from Last 3 Encounters:  05/22/17 122/76  04/23/17 130/82  12/27/16 123/86    He was last seen for hypertension 9 months ago.  BP at that visit was 124/68. Management since that visit includes; no changes.He reports good compliance with treatment.   ----------------------------------------------------------------  Vitamin D deficiency From 08/22/2016-Counseled to take daily vitamin d supplement. Lab Results  Component Value Date   VD25OH 19.2 (L) 05/15/2016    NAFLD (nonalcoholic fatty liver disease) From 08/22/2016-Follow up abd u/s ordered.  He also reports he occasional gets tightness in chest unrelated to exertion. He has been followed by Dr. Humphrey Rolls in the past and would like to follow up with him if necessary.    Review of Systems  HENT: Positive for sinus pressure.   Eyes: Positive for photophobia and visual disturbance.  Respiratory:  Negative.   Cardiovascular: Positive for chest pain.  Gastrointestinal: Positive for abdominal pain and nausea.  Endocrine: Negative.   Genitourinary: Negative.   Musculoskeletal: Positive for arthralgias, myalgias, neck pain and neck stiffness.  Skin: Negative.   Allergic/Immunologic: Negative.   Neurological: Positive for dizziness, weakness and headaches.  Hematological: Negative.   Psychiatric/Behavioral: Negative.     Social History   Socioeconomic History  . Marital status: Married    Spouse name: Not on file  . Number of children: 0  . Years of education: Not on file  . Highest education level: 12th grade  Social Needs  . Financial resource strain: Not hard at all  . Food insecurity - worry: Sometimes true  . Food insecurity - inability: Sometimes true  . Transportation needs - medical: No  . Transportation needs - non-medical: No  Occupational History  . Occupation: Disabled  Tobacco Use  . Smoking status: Never Smoker  . Smokeless tobacco: Never Used  Substance and Sexual Activity  . Alcohol use: No    Alcohol/week: 0.0 oz  . Drug use: No  . Sexual activity: Not on file  Other Topics Concern  . Not on file  Social History Narrative   ** Merged History Encounter **       ** Data from: 10/22/14 Enc Dept: [facility]       ** Data from: 10/07/14 Enc Dept: [facility]   Arrest: Arrested in 2012 for indecent liberties, required to wear ankle monitor    Past Medical History:  Diagnosis Date  . Arrhythmia   . Asthma   . Cyst  of kidney, acquired   . Diabetes mellitus without complication ([place]) 1610   type 2  . GERD (gastroesophageal reflux disease)   . History of chicken pox   . History of measles as a child   . History of PKU   . Hyperlipidemia   . Hypertension   . IBS (irritable bowel syndrome)   . Irregular heart beat   . Mentally challenged   . Pancreatitis      Patient Active Problem List   Diagnosis Date Noted  . Skin tags,  multiple acquired 08/23/2016  . Subacromial bursitis 08/23/2016  . Frequent falls 05/15/2016  . Vitamin D deficiency 08/22/2015  . Knee pain 08/22/2015  . Carpal tunnel syndrome on left 05/12/2015  . External hemorrhoids 12/20/2014  . Ectatic abdominal aorta ([place]) 10/26/2014  . Allergic rhinitis 10/21/2014  . Diverticulosis 10/21/2014  . Diabetes mellitus type 2, controlled ([place]) 10/21/2014  . Hypertension 10/21/2014  . Hyperlipidemia 10/21/2014  . Pulmonary emphysema ([place]) 10/21/2014  . Asthma without status asthmaticus 10/21/2014  . GERD (gastroesophageal reflux disease) 10/21/2014  . NAFLD (nonalcoholic fatty liver disease) 10/21/2014  . 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 Arthritis 10/21/2014  . Chest pain with high risk for cardiac etiology 10/21/2014  . Abdominal pain 10/07/2014  . Elevation of level of transaminase or lactic acid dehydrogenase (LDH) 10/07/2014  . Fatigue 10/07/2014  . Cardiac arrhythmia 10/07/2014  . Irritable bowel syndrome 10/07/2014  . Pancreatitis 10/07/2014  . Phenylketonuria (PKU) ([place]) 10/07/2014  . Renal cyst, left 10/07/2014  . Headache 10/07/2014  . SOB (shortness of breath) on exertion 06/09/2014  . Gall bladder polyp 10/01/2013    Past Surgical History:  Procedure Laterality Date  . Cardiac Catherization     [facility]  . CARDIAC CATHETERIZATION     [facility]  . COLONOSCOPY    . ESOPHAGOGASTRODUODENOSCOPY (EGD) WITH PROPOFOL N/A 12/27/2016   Procedure: ESOPHAGOGASTRODUODENOSCOPY (EGD) WITH PROPOFOL;  Surgeon: Lin Landsman, MD;  Location: [facility];  Service: Gastroenterology;  Laterality: N/A;  . HEMORRHOID SURGERY    . Ligament Removal Left    of left thumb: dr. Cleda Mccreedy  . ligament removal  of left thumb     Dr. Cleda Mccreedy  . NM GATED MYOCARDIAL STUDY (ARMX HX)  06/23/2014   [place]. Normal    His family history includes Cancer in his maternal grandfather, maternal grandmother, mother, and paternal grandfather; Diabetes in his father;  Heart disease in his father.      Current Outpatient Medications:  .  Cholecalciferol (VITAMIN D3) 50000 units CAPS, Take 50,000 Units by mouth every 30 (thirty) days. (Patient taking differently: Take 50,000 Units by mouth once a week. ), Disp: 12 capsule, Rfl: 0 .  FLOVENT DISKUS 100 MCG/BLIST AEPB, inhale 1 puff by mouth twice a day Rinse mouth after use, Disp: 60 each, Rfl: 5 .  glipiZIDE (GLUCOTROL XL) 2.5 MG 24 hr tablet, TAKE 1 TABLET BY MOUTH ONCE EVERY MORNING BEFORE BREAKFAST., Disp: 30 tablet, Rfl: 11 .  glucose blood (ONE TOUCH ULTRA TEST) test strip, 1 each by Other route daily. for testing, Disp: 100 each, Rfl: 4 .  lisinopril (PRINIVIL,ZESTRIL) 20 MG tablet, take 1 tablet by mouth once daily, Disp: 210 tablet, Rfl: 0 .  metFORMIN (GLUCOPHAGE-XR) 500 MG 24 hr tablet, take 2 tablets by mouth twice a day before meals, Disp: 120 tablet, Rfl: 12 .  montelukast (SINGULAIR) 10 MG tablet, TAKE 1 TABLET BY MOUTH ONCE EVERY EVENING AT BEDTIME, Disp: 30 tablet, Rfl: 5 .  naproxen (NAPROSYN) 500 MG tablet, Take 1 tablet (500 mg total) by mouth 2 (two) times daily with a meal., Disp: 30 tablet, Rfl: 2 .  omeprazole (PRILOSEC) 20 MG capsule, take 1 capsule by mouth once daily, Disp: 30 capsule, Rfl: 5 .  ONETOUCH DELICA LANCETS 76B MISC, TEST once daily, Disp: 100 each, Rfl: 3 .  ONETOUCH DELICA LANCETS FINE MISC, TEST once daily, Disp: 100 each, Rfl: 3 .  PARoxetine (PAXIL) 20 MG tablet, Take 1 tablet (20 mg total) by mouth daily., Disp: 30 tablet, Rfl: 1 .  PROAIR HFA 108 (90 Base) MCG/ACT inhaler, inhale 2 puffs by mouth every 6 hours if needed for wheezing, Disp: 8.5 g, Rfl: 3 .  simvastatin (ZOCOR) 20 MG tablet, Take 1 tablet (20 mg total) by mouth daily., Disp: 30 tablet, Rfl: 6 .  tiZANidine (ZANAFLEX) 4 MG capsule, Take 4 mg by mouth 3 (three) times daily as needed for muscle spasms., Disp: , Rfl:  .  triamcinolone cream (KENALOG) 0.5 %, apply to affected area twice a day, Disp: 30 g,  Rfl: 5  Patient Care Team: Birdie Sons, MD as PCP - General (Family Medicine) Anell Barr, OD (Optometry) Lin Landsman, MD as Consulting Physician (Gastroenterology) Bary Castilla, Forest Gleason, MD as Consulting Physician (General Surgery)     Objective:   Vitals:  BP  122/76 (BP Location: Left Arm)     Pulse  64     Temp  97.7 F (36.5 C) (Oral)     Ht  5' 4" (1.626 m)     Wt  189 lb 9.6 oz (86 kg)      BMI  32.54 kg/m       Physical Exam    General Appearance:    Alert, cooperative, no distress, appears stated age  Head:    Normocephalic, without obvious abnormality, atraumatic  Eyes:    PERRL, conjunctiva/corneas clear, EOM's intact, fundi    benign, both eyes       Ears:    Normal TM's and external ear canals, both ears  Nose:   Nares normal, septum midline, mucosa normal, no drainage   or sinus tenderness  Throat:   Lips, mucosa, and tongue normal; teeth and gums normal  Neck:   Supple, symmetrical, trachea midline, no adenopathy;       thyroid:  No enlargement/tenderness/nodules; no carotid   bruit or JVD  Back:     Symmetric, no curvature, ROM normal, no CVA tenderness  Lungs:     Clear to auscultation bilaterally, respirations unlabored  Chest wall:    No tenderness or deformity  Heart:    Regular rate and rhythm, S1 and S2 normal, no murmur, rub   or gallop  Abdomen:     Soft, non-tender, bowel sounds active all four quadrants,    no masses, no organomegaly  Genitalia:    deferred  Rectal:    deferred  Extremities:   Extremities normal, atraumatic, no cyanosis or edema  Pulses:   2+ and symmetric all extremities  Skin:   Skin color, texture, turgor normal, no rashes or lesions  Lymph nodes:   Cervical, supraclavicular, and axillary nodes normal  Neurologic:   CNII-XII intact. Normal strength, sensation and reflexes      throughout     Activities of Daily Living In your present state of health, do you have any difficulty performing  the following activities: 05/22/2017  Hearing? N  Vision? Y  Comment Has an eye apt  coming up.   Difficulty concentrating or making decisions? N  Walking or climbing stairs? Y  Comment Due to pain in knees.   Dressing or bathing? N  Doing errands, shopping? N  Preparing Food and eating ? N  Using the Toilet? N  In the past six months, have you accidently leaked urine? N  Do you have problems with loss of bowel control? N  Managing your Medications? Y  Comment Wife takes care of this.  Managing your Finances? Y  Comment Wife takes care of this.   Housekeeping or managing your Housekeeping? N  Some recent data might be hidden    Fall Risk Assessment Fall Risk  05/22/2017 05/15/2016  Falls in the past year? Yes Yes  Number falls in past yr: 2 or more 2 or more  Injury with Fall? No No  Follow up Falls prevention discussed Falls prevention discussed     Depression Screen PHQ 2/9 Scores 05/22/2017 05/22/2017 05/15/2016 05/15/2016  PHQ - 2 Score _0 PHQ- 9 Score 5 - 12 -        Assessment & Plan:    Annual Physical Reviewed patient's Family Medical History Reviewed and updated list of patient's medical providers Assessment of cognitive impairment was done Assessed patient's functional ability Established a written schedule for health screening [place] Completed and Reviewed  Exercise Activities and Dietary recommendations Goals    . DIET - INCREASE WATER INTAKE     Recommend increasing water intake to 4 glasses a day.        Immunization History  Administered Date(s) Administered  . Hepatitis A, Adult 09/26/2016  . Hepatitis B, adult 09/26/2016  . Influenza,inj,Quad PF,6+ Mos 12/22/2012, 12/21/2013  . Influenza-Unspecified 11/30/2014, 11/18/2015  . Pneumococcal Polysaccharide-23 12/22/2012, 11/18/2015    Health Maintenance  Topic Date Due  . Samul Dada  02/09/1982  . OPHTHALMOLOGY EXAM  06/21/2016  . HEMOGLOBIN A1C  02/21/2017  .  FOOT EXAM  04/26/2017  . PNEUMOCOCCAL POLYSACCHARIDE VACCINE (2) 12/22/2017  . Fecal DNA (Cologuard)  05/30/2019  . INFLUENZA VACCINE  Completed  . Hepatitis C Screening  Completed  . HIV Screening  Completed     Discussed health benefits of physical activity, and encouraged him to engage in regular exercise appropriate for his age and condition.    ------------------------------------------------------------------------------------------------------------  1. Annual physical exam  - Comprehensive metabolic panel - Lipid panel - EKG 12-Lead  2. NAFLD (nonalcoholic fatty liver disease)  - Comprehensive metabolic panel - Hepatitis A vaccine adult IM #2 - Hepatitis B vaccine adult IM #2  3. Essential hypertension Well controlled. Continue current medications.   - Comprehensive metabolic panel - TSH - EKG 12-Lead  4. Controlled type 2 diabetes mellitus without complication, with long-term current use of insulin (HCC)  - Hemoglobin A1c  5. Vitamin D deficiency  - VITAMIN D 25 Hydroxy (Vit-D Deficiency, Fractures)  6. Prostate cancer screening  - PSA  7. Hyperlipidemia, unspecified hyperlipidemia type He is tolerating simvastatin well with no adverse effects.   - Lipid panel - TSH  8. Chest pain, unspecified type  - Ambulatory referral to Cardiology   Lelon Huh, MD  [facility]

## 2017-05-22 NOTE — Telephone Encounter (Signed)
Pt's wife returning call about referral. She is asking if Dr. Humphrey Rolls takes [place]? CB# [phone].

## 2017-05-23 LAB — COMPREHENSIVE METABOLIC PANEL
ALK PHOS: 134 IU/L — AB (ref 39–117)
ALT: 38 IU/L (ref 0–44)
AST: 28 IU/L (ref 0–40)
Albumin/Globulin Ratio: 1.8 (ref 1.2–2.2)
Albumin: 4.5 g/dL (ref 3.5–5.5)
BILIRUBIN TOTAL: 0.4 mg/dL (ref 0.0–1.2)
BUN/Creatinine Ratio: 13 (ref 9–20)
BUN: 13 mg/dL (ref 6–24)
CHLORIDE: 101 mmol/L (ref 96–106)
CO2: 27 mmol/L (ref 20–29)
Calcium: 9.2 mg/dL (ref 8.7–10.2)
Creatinine, Ser: 1.04 mg/dL (ref 0.76–1.27)
GFR calc non Af Amer: 81 mL/min/{1.73_m2} (ref >59)
GFR, EST AFRICAN AMERICAN: 94 mL/min/{1.73_m2} (ref >59)
GLUCOSE: 214 mg/dL — AB (ref 65–99)
Globulin, Total: 2.5 g/dL (ref 1.5–4.5)
POTASSIUM: 4.4 mmol/L (ref 3.5–5.2)
Sodium: 138 mmol/L (ref 134–144)
TOTAL PROTEIN: 7 g/dL (ref 6.0–8.5)

## 2017-05-23 LAB — LIPID PANEL
CHOL/HDL RATIO: 4.2 ratio (ref 0.0–5.0)
CHOLESTEROL TOTAL: 142 mg/dL (ref 100–199)
HDL: 34 mg/dL — AB (ref >39)
LDL CALC: 59 mg/dL (ref 0–99)
Triglycerides: 244 mg/dL — ABNORMAL HIGH (ref 0–149)
VLDL Cholesterol Cal: 49 mg/dL — ABNORMAL HIGH (ref 5–40)

## 2017-05-23 LAB — PSA: Prostate Specific Ag, Serum: 0.3 ng/mL (ref 0.0–4.0)

## 2017-05-23 LAB — TSH: TSH: 1.35 u[IU]/mL (ref 0.450–4.500)

## 2017-05-23 LAB — HEMOGLOBIN A1C
Est. average glucose Bld gHb Est-mCnc: 148 mg/dL
HEMOGLOBIN A1C: 6.8 % — AB (ref 4.8–5.6)

## 2017-05-23 LAB — VITAMIN D 25 HYDROXY (VIT D DEFICIENCY, FRACTURES): Vit D, 25-Hydroxy: 21.2 ng/mL — ABNORMAL LOW (ref 30.0–100.0)

## 2017-05-24 ENCOUNTER — Telehealth: Payer: Self-pay

## 2017-05-24 NOTE — Telephone Encounter (Signed)
Pt's wife advised of recent lab results. She wanted Dr. Caryn Section to know that pt's nephrologist D/C his metformin. I scheduled DM FU for 3 months instead of 4, and told wife that I would advise Dr. Caryn Section of the change.

## 2017-05-24 NOTE — Telephone Encounter (Signed)
Please review. Thanks!

## 2017-06-04 ENCOUNTER — Other Ambulatory Visit: Payer: Self-pay | Admitting: Family Medicine

## 2017-06-06 IMAGING — CR DG CHEST 2V
2 series · 2 of 2 positions shown · non-contrast
Comparison: Radiograph 04/27/2009

CLINICAL DATA: Right-sided chest pain. Fall outside and across
today.

EXAM:
CHEST  2 VIEW

[chest pa]
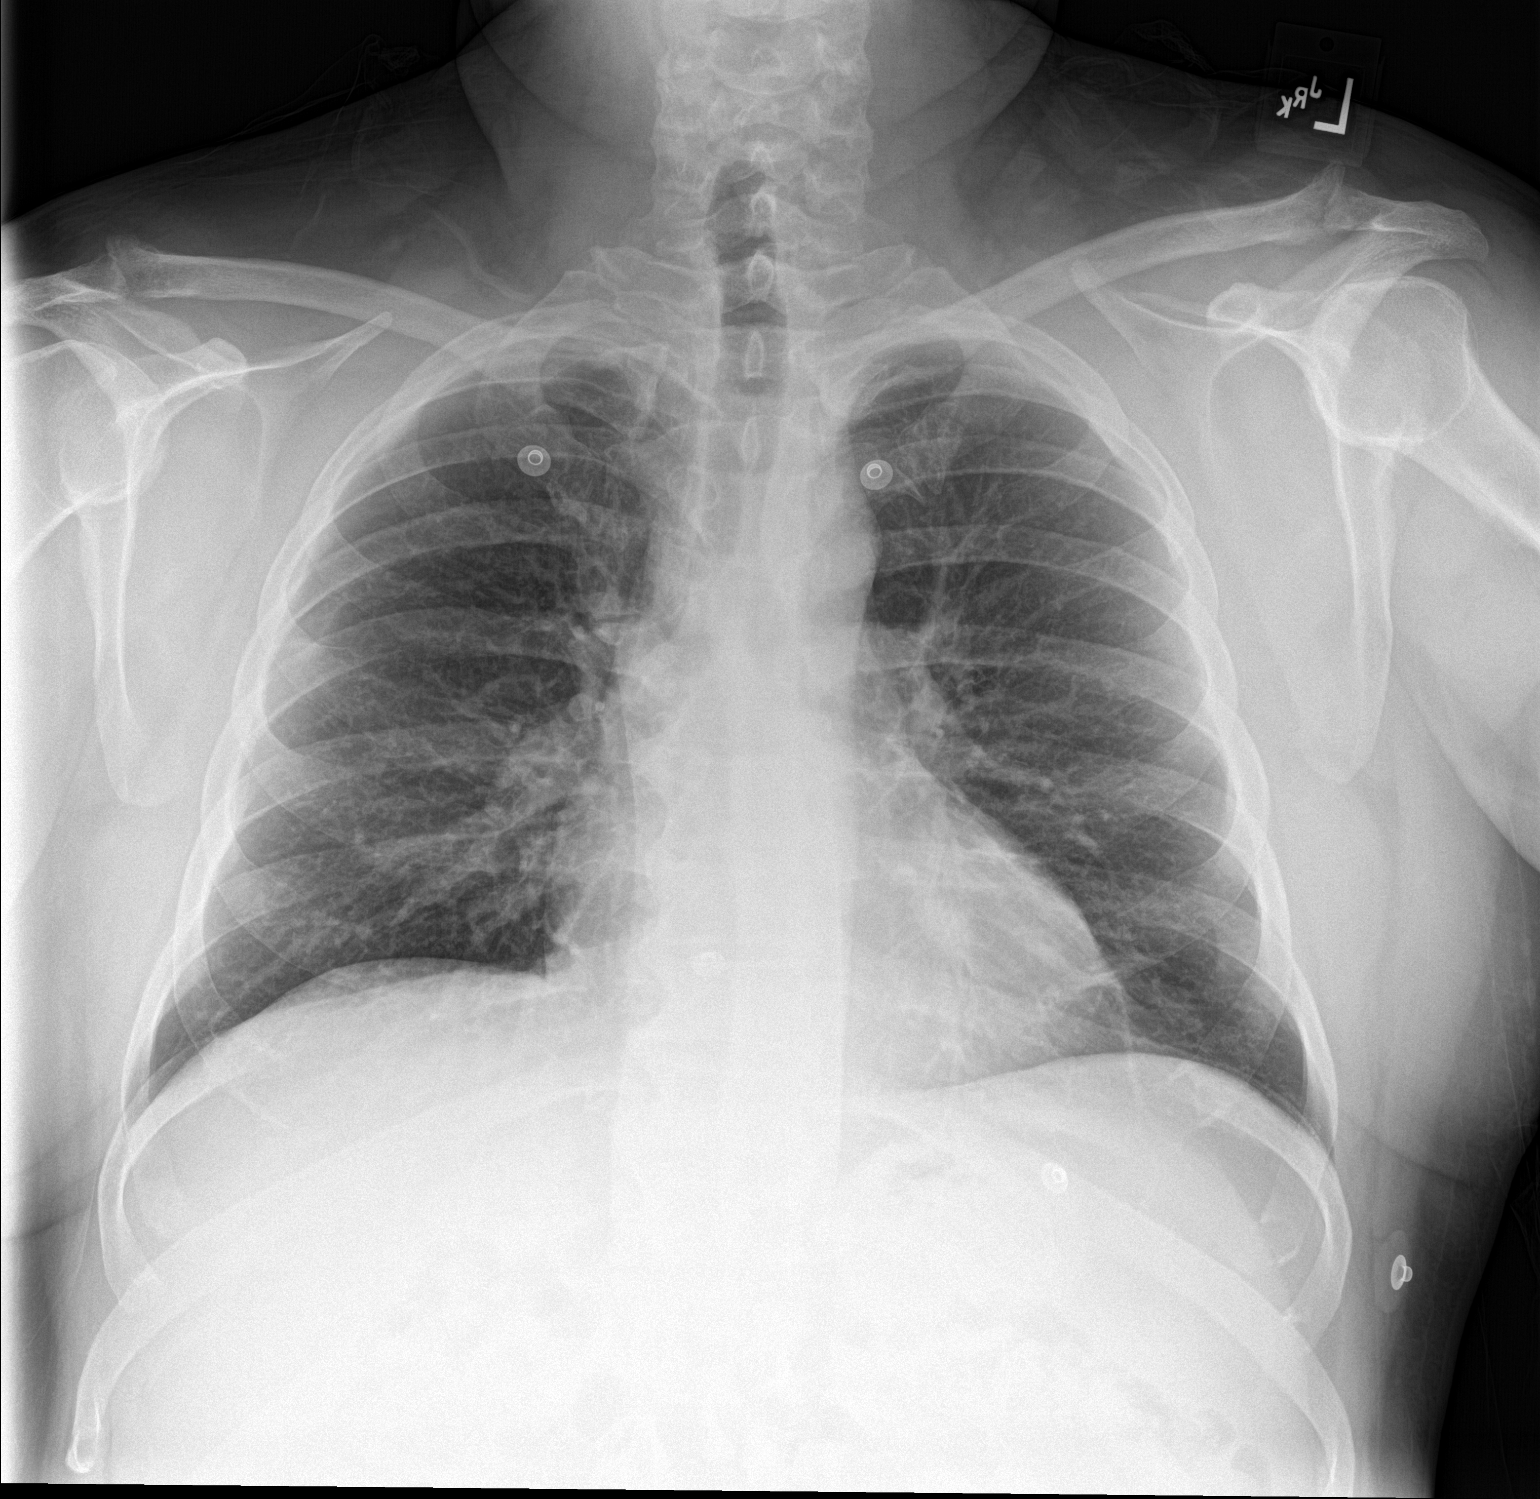

[chest lat]
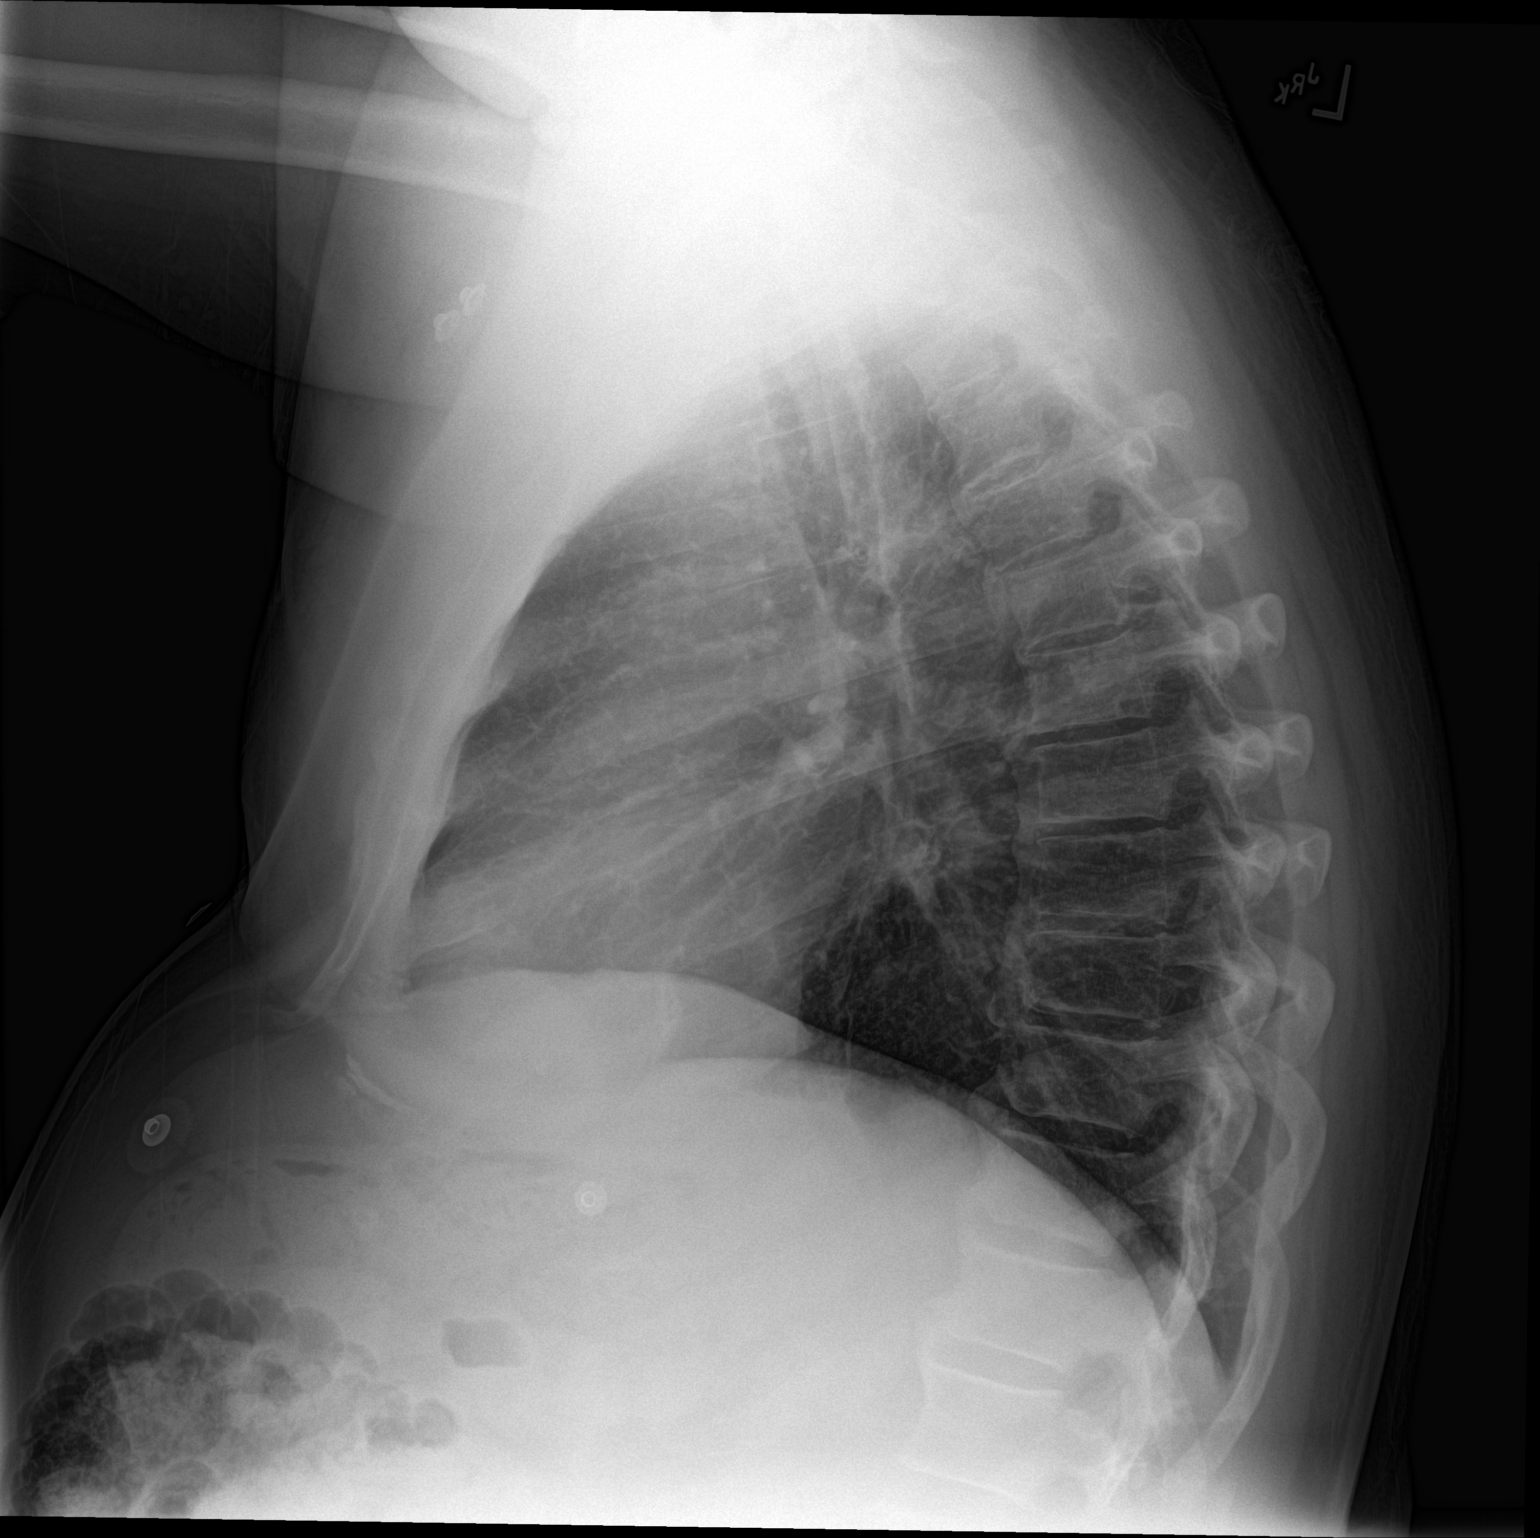

[2 of 2 positions shown; findings below may reference images not displayed]

FINDINGS: The cardiomediastinal contours are normal. Previous lingular opacity
has resolved. Pulmonary vasculature is normal. No consolidation,
pleural effusion, or pneumothorax. No acute osseous abnormalities
are seen.
IMPRESSION: No active cardiopulmonary disease.

## 2017-06-20 IMAGING — US US ABDOMEN LIMITED
1 series · 14 of 25 positions shown · non-contrast
Comparison: [REDACTED] 4728

CLINICAL DATA: steoto hepatitis.

EXAM:
US ABDOMEN LIMITED - RIGHT UPPER QUADRANT

[Series 1: us abdomen limited · 0.17mm/px · 14 of 83 slices shown]
[im 1/83]
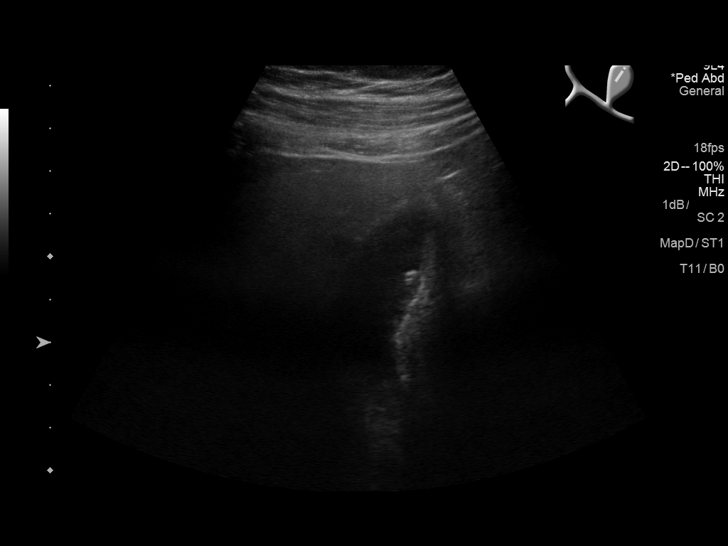
[im 7/83]
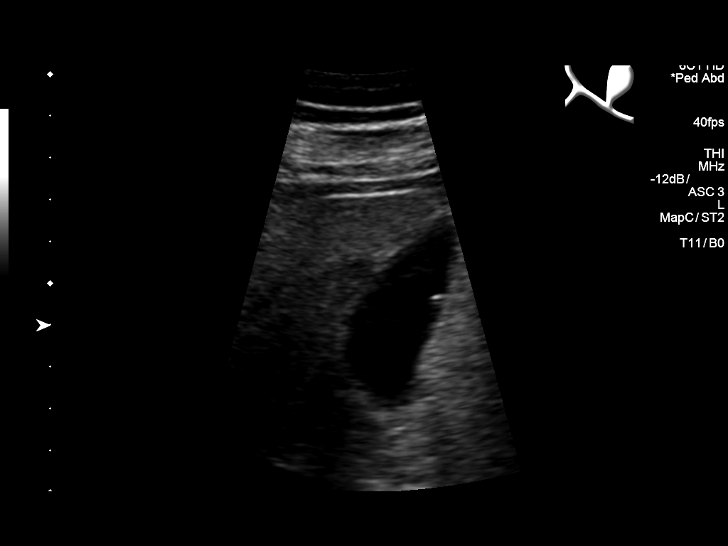
[im 14/83]
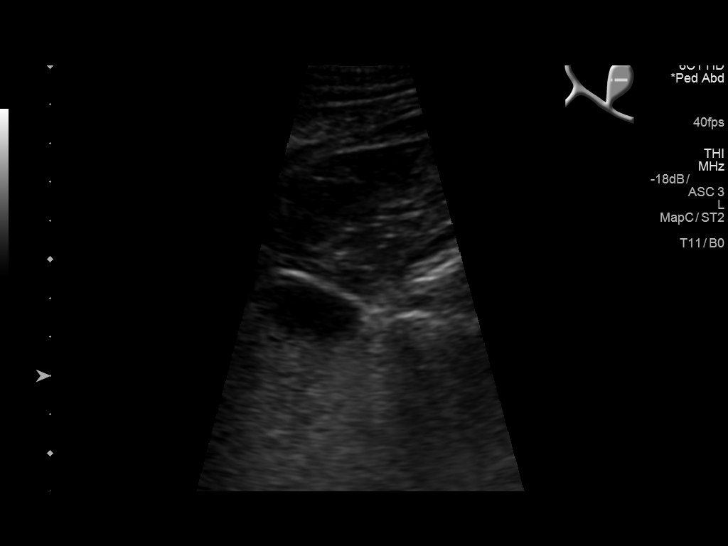
[im 21/83]
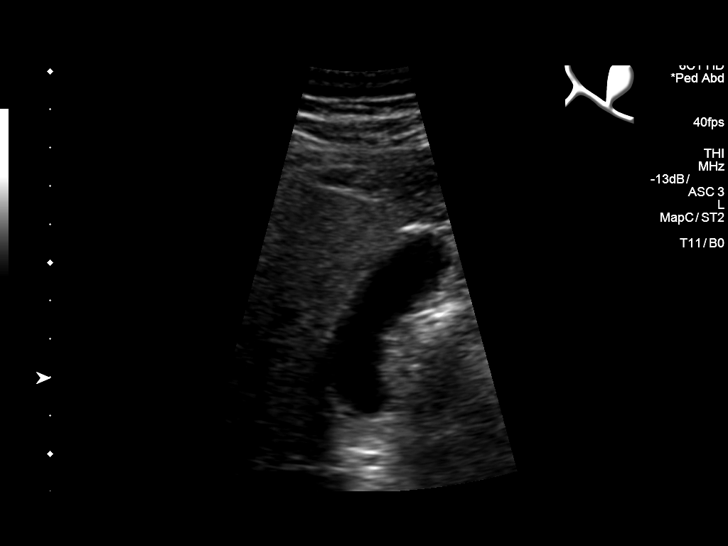
[im 28/83]
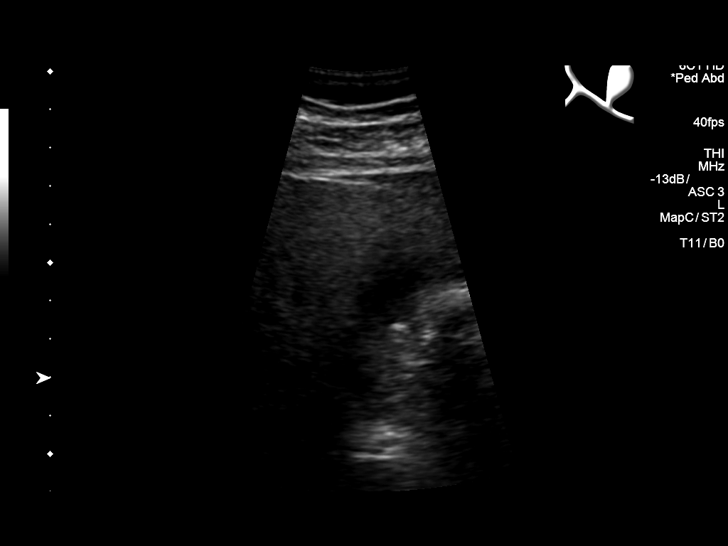
[im 31/83]
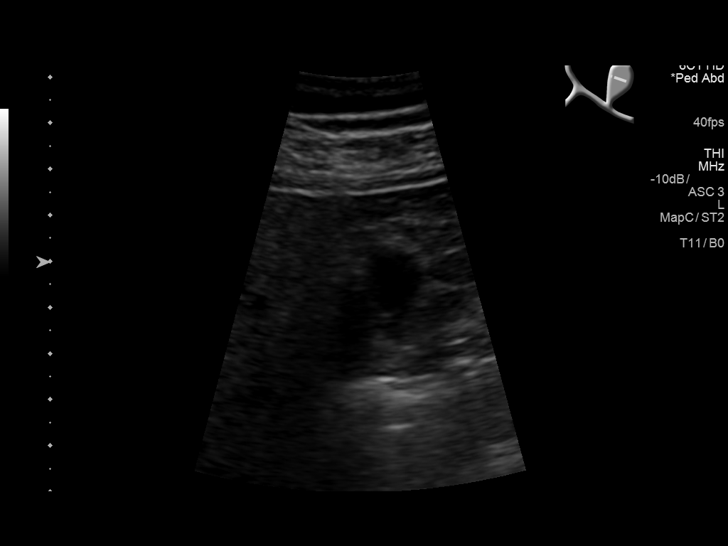
[im 38/83]
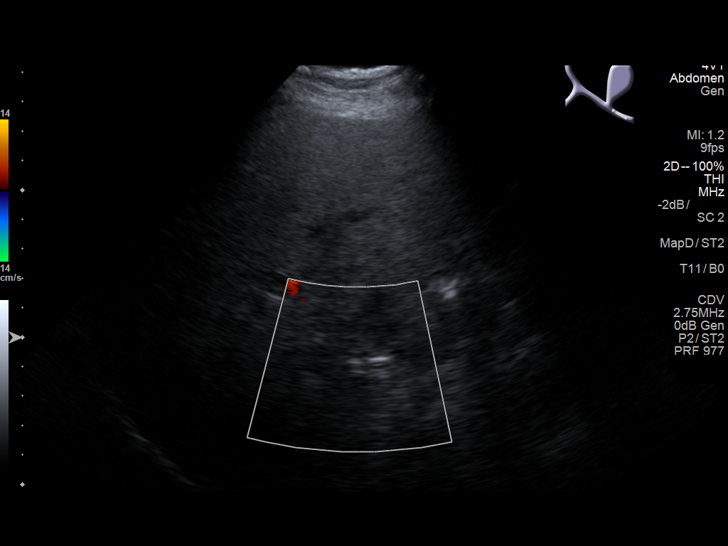
[im 45/83]
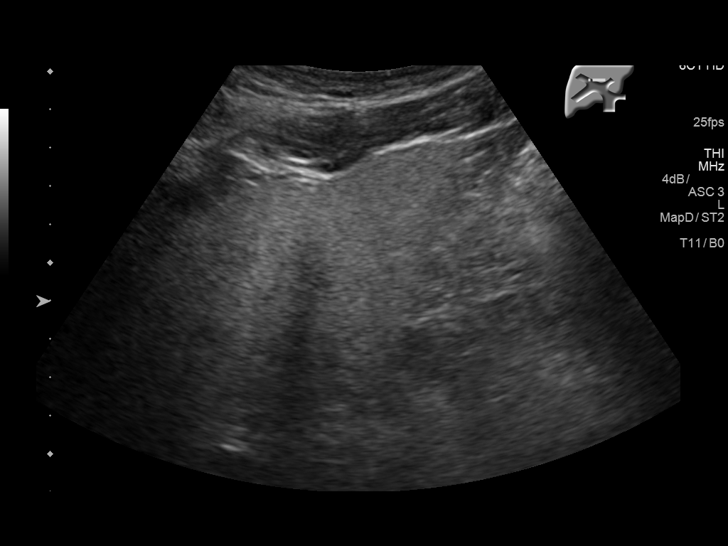
[im 52/83]
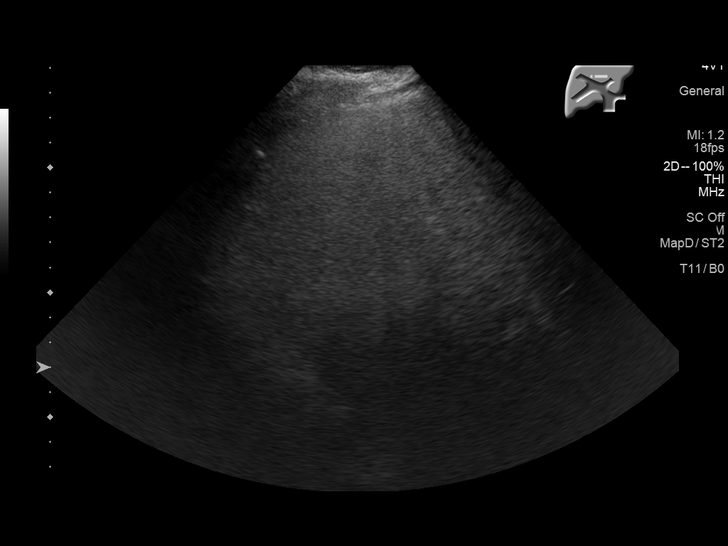
[im 55/83]
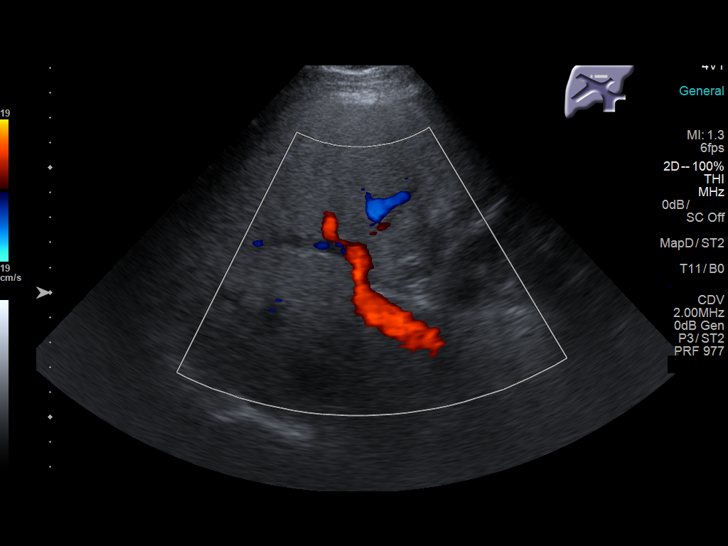
[im 62/83]
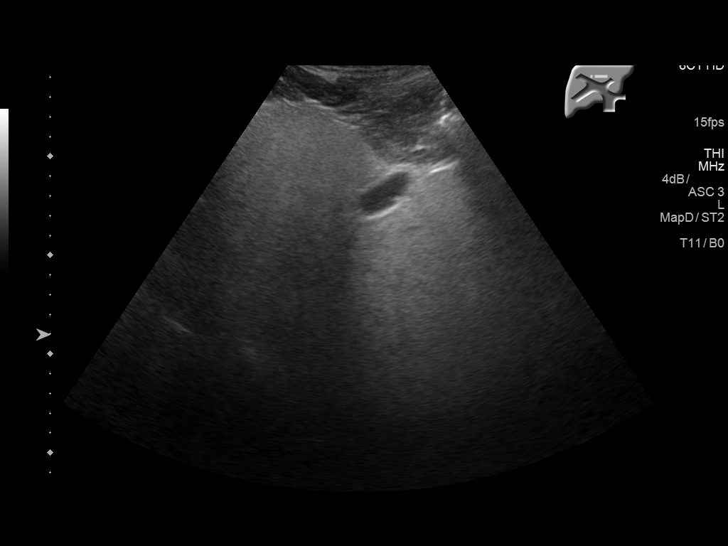
[im 69/83]
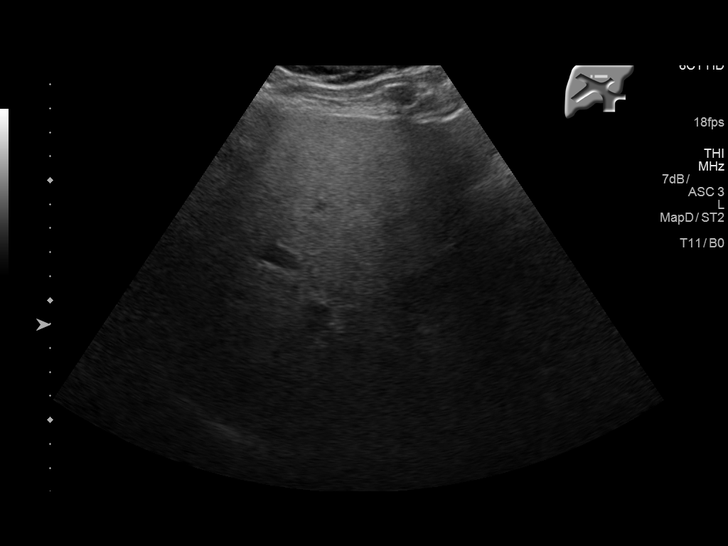
[im 76/83]
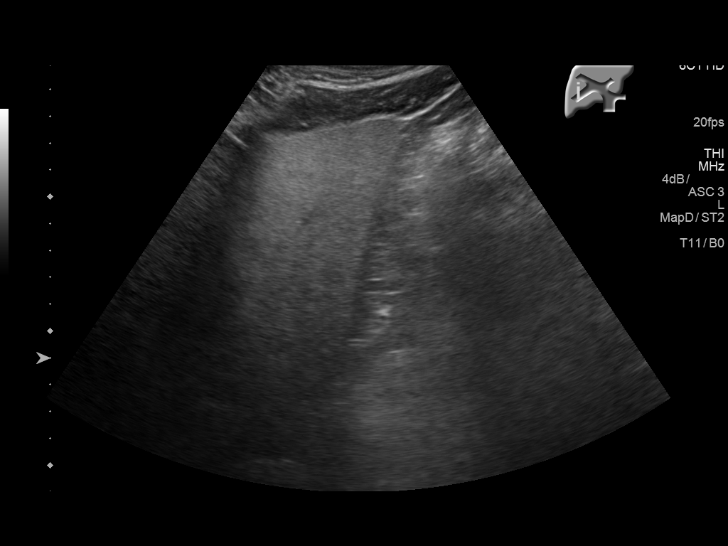
[im 83/83]
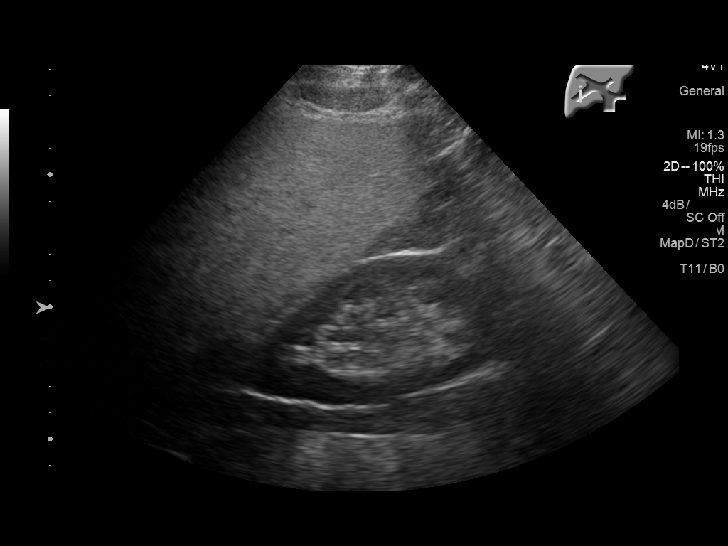

[14 of 25 positions shown; findings below may reference images not displayed]

FINDINGS: Gallbladder:

There is a 4 mm non mobile focus in the gallbladder unchanged
compared to prior ultrasound, questioned polyp versus a non mobile
stone. No wall thickening visualized. No sonographic Murphy sign
noted by sonographer.

Common bile duct:

Diameter: 3.4 mm

Liver:

No focal lesion identified. There is diffuse increased echotexture
of the liver.
IMPRESSION: Fatty infiltration of liver.

4 mm echogenic focus in the gallbladder unchanged compared to prior
ultrasound, questioned non mobile stone versus polyp.

## 2017-06-21 DIAGNOSIS — H04123 Dry eye syndrome of bilateral lacrimal glands: Secondary | ICD-10-CM | POA: Diagnosis not present

## 2017-06-21 DIAGNOSIS — H2513 Age-related nuclear cataract, bilateral: Secondary | ICD-10-CM | POA: Diagnosis not present

## 2017-06-21 DIAGNOSIS — E1165 Type 2 diabetes mellitus with hyperglycemia: Secondary | ICD-10-CM | POA: Diagnosis not present

## 2017-06-23 ENCOUNTER — Other Ambulatory Visit: Payer: Self-pay | Admitting: Family Medicine

## 2017-07-01 LAB — HM DIABETES EYE EXAM

## 2017-07-30 ENCOUNTER — Other Ambulatory Visit: Payer: Self-pay

## 2017-07-30 NOTE — Patient Outreach (Signed)
[facility]) Care Management  07/30/2017  [name]. 03-21-1960 [id number]   Medication Adherence call to [name] patient is showing past due under [facility] Ins.on Lisinopril 20 mg and Atorvastatin 80 mg left a message for patient to call back,[name] said patient pick up both medication on 07/17/17 for a 30 days supply.  [place] Management Direct Dial [phone]  Fax [phone] [name]

## 2017-07-30 NOTE — Patient Outreach (Signed)
[facility]) Care Management  07/30/2017  [name]. 1960/08/30 [id number]   Medication Adherence call to [name] patient is showing past due under [facility] Ins.on Lisinopril and Simvastatin patient did not answer left a message for patient to call back.pharmacy said patient last pick up was in April.  [place] Management Direct Dial [phone]  Fax [phone] [name]

## 2017-08-01 ENCOUNTER — Other Ambulatory Visit: Payer: Self-pay

## 2017-08-01 MED ORDER — SIMVASTATIN 20 MG PO TABS: 20.0000 mg | ORAL_TABLET | Freq: Every day | ORAL | 4 refills | Status: DC

## 2017-08-01 MED ORDER — LISINOPRIL 20 MG PO TABS: 20.0000 mg | ORAL_TABLET | Freq: Every day | ORAL | 4 refills | Status: DC

## 2017-08-01 NOTE — Telephone Encounter (Signed)
Cindy Hazy with [facility] Medication Adherence called stating that patient has to have a 90 day supply of Lisinopril and Simvastatin sent to [place]. Wilhemena Durie says it has to be a 90 day supply. Ana's call back is [phone]

## 2017-08-07 ENCOUNTER — Other Ambulatory Visit: Payer: Self-pay | Admitting: Family Medicine

## 2017-08-27 ENCOUNTER — Ambulatory Visit (INDEPENDENT_AMBULATORY_CARE_PROVIDER_SITE_OTHER): Payer: Medicare Other | Admitting: Family Medicine

## 2017-08-27 ENCOUNTER — Encounter: Payer: Self-pay | Admitting: Family Medicine

## 2017-08-27 VITALS — BP 120/64 | HR 53 | Temp 97.4°F | Resp 18 | Wt 190.0 lb

## 2017-08-27 DIAGNOSIS — E1121 Type 2 diabetes mellitus with diabetic nephropathy: Secondary | ICD-10-CM

## 2017-08-27 DIAGNOSIS — R079 Chest pain, unspecified: Secondary | ICD-10-CM | POA: Diagnosis not present

## 2017-08-27 DIAGNOSIS — I150 Renovascular hypertension: Secondary | ICD-10-CM | POA: Diagnosis not present

## 2017-08-27 DIAGNOSIS — E7800 Pure hypercholesterolemia, unspecified: Secondary | ICD-10-CM

## 2017-08-27 LAB — POCT GLYCOSYLATED HEMOGLOBIN (HGB A1C)
Est. average glucose Bld gHb Est-mCnc: 171
Hemoglobin A1C: 7.6 % — AB (ref 4.0–5.6)

## 2017-08-27 MED ORDER — NITROGLYCERIN 0.4 MG SL SUBL: 0.4000 mg | SUBLINGUAL_TABLET | SUBLINGUAL | 3 refills | Status: DC | PRN

## 2017-08-27 MED ORDER — SIMVASTATIN 40 MG PO TABS: 40.0000 mg | ORAL_TABLET | Freq: Every day | ORAL | 4 refills | Status: DC

## 2017-08-27 MED ORDER — SITAGLIPTIN PHOSPHATE 100 MG PO TABS: 100.0000 mg | ORAL_TABLET | Freq: Every day | ORAL | 5 refills | Status: DC

## 2017-08-27 NOTE — Patient Instructions (Addendum)
Recommend taking 55m enteric coated aspirin to reduce risk of vascular events such as heart attacks and strokes.

## 2017-08-27 NOTE — Progress Notes (Signed)
Patient: [name]. Male    DOB: 16-Aug-1960   57 y.o.   MRN: [id number] Visit Date: 08/27/2017  Today's Provider: Lelon Huh, MD   Chief Complaint  Patient presents with  . Diabetes  . Hypertension   Subjective:    HPI    Diabetes Mellitus Type II, Follow-up:   Lab Results  Component Value Date   HGBA1C 6.8 (H) 05/22/2017   HGBA1C 6.7 08/22/2016   HGBA1C 6.6 05/15/2016   Last seen for diabetes 3 months ago.  Management since then includes; no changes. Changes since last ov: metformin was discontinued by nephrologist, per patient's wife.  He is not having side effects.  Current symptoms include none and have been stable. Home blood sugar records: fasting range: 250  Episodes of hypoglycemia? no   Current Insulin Regimen: none Most Recent Eye Exam: <1 year ago Weight trend: stable Prior visit with dietician: no Current diet: in general, an "unhealthy" diet Current exercise: none  He states he heard that metformin is bad for the kidneys and his kidney doctor told him to stop taking, however I don't have any records of him seeing a kidney specialist or any history of kidney disease, then he states maybe it was the gastroenterologist or Dr. Bary Castilla that stopped it. He is very poor and inconsistent  historian.  ------------------------------------------------------------------------   Hypertension, follow-up:  BP Readings from Last 3 Encounters:  08/27/17 120/64  05/22/17 122/76  04/23/17 130/82    He was last seen for hypertension 3 months ago.  BP at that visit was 122/76. Management since that visit includes; no changes.He reports good compliance with treatment. He is not having side effects.  He is not exercising. He is not adherent to low salt diet.   Outside blood pressures are not being checked. He is experiencing chest pain.  Patient denies irregular heart beat, near-syncope, orthopnea and tachypnea.   Cardiovascular risk factors  include hypertension, male gender and sedentary lifestyle.  Use of agents associated with hypertension: none.   ------------------------------------------------------------------------  He also reports he gets pain in his mid and left side of chest just about every day. Worse with exertion. Better when he stops to rest. Associated with some shortness of breath. Does not radiate into should. Has been going on for several months. He was referred to cardiology for this in March, but he did not keep appointment due to having to make a copay.   Allergies  Allergen Reactions  . Bee Venom Hives    All kinds of bees  . Other     Certain powders  . Sulfa Antibiotics      Current Outpatient Medications:  .  Cholecalciferol (VITAMIN D3) 50000 units CAPS, Take 50,000 Units by mouth every 30 (thirty) days. (Patient taking differently: Take 50,000 Units by mouth once a week. ), Disp: 12 capsule, Rfl: 0 .  Fluticasone Propionate, Inhal, (FLOVENT DISKUS) 100 MCG/BLIST AEPB, inhale 1 puff by mouth twice a day Rinse mouth after use, Disp: 60 each, Rfl: 5 .  glipiZIDE (GLUCOTROL XL) 2.5 MG 24 hr tablet, TAKE 1 TABLET BY MOUTH ONCE EVERY MORNING BEFORE BREAKFAST., Disp: 30 tablet, Rfl: 11 .  glucose blood (ONE TOUCH ULTRA TEST) test strip, 1 each by Other route daily. for testing, Disp: 100 each, Rfl: 4 .  lisinopril (PRINIVIL,ZESTRIL) 20 MG tablet, Take 1 tablet (20 mg total) by mouth daily., Disp: 90 tablet, Rfl: 4 .  montelukast (SINGULAIR) 10 MG tablet, TAKE  1 TABLET BY MOUTH ONCE EVERY EVENING AT BEDTIME, Disp: 30 tablet, Rfl: 5 .  naproxen (NAPROSYN) 500 MG tablet, Take 1 tablet (500 mg total) by mouth 2 (two) times daily with a meal., Disp: 30 tablet, Rfl: 2 .  omeprazole (PRILOSEC) 20 MG capsule, take 1 capsule by mouth once daily, Disp: 30 capsule, Rfl: 5 .  ONETOUCH DELICA LANCETS 38H MISC, TEST ONCE A DAY., Disp: 100 each, Rfl: 3 .  ONETOUCH DELICA LANCETS FINE MISC, TEST once daily, Disp: 100  each, Rfl: 3 .  PARoxetine (PAXIL) 20 MG tablet, Take 1 tablet (20 mg total) by mouth daily., Disp: 30 tablet, Rfl: 1 .  PROAIR HFA 108 (90 Base) MCG/ACT inhaler, inhale 2 puffs by mouth every 6 hours if needed for wheezing, Disp: 8.5 g, Rfl: 3 .  simvastatin (ZOCOR) 20 MG tablet, Take 1 tablet (20 mg total) by mouth daily., Disp: 90 tablet, Rfl: 4 .  tiZANidine (ZANAFLEX) 4 MG capsule, Take 4 mg by mouth 3 (three) times daily as needed for muscle spasms., Disp: , Rfl:  .  triamcinolone cream (KENALOG) 0.5 %, apply to affected area twice a day, Disp: 30 g, Rfl: 5 .  Vitamin D, Ergocalciferol, (DRISDOL) 50000 units CAPS capsule, TAKE 1 CAPSULE BY MOUTH EVERY WEEK., Disp: 4 capsule, Rfl: 12  Review of Systems  Constitutional: Negative for appetite change, chills and fever.  Respiratory: Negative for chest tightness, shortness of breath and wheezing.   Cardiovascular: Positive for chest pain. Negative for palpitations.  Gastrointestinal: Negative for abdominal pain, nausea and vomiting.  Musculoskeletal: Positive for arthralgias (left wrist and left knee pain).    Social History   Tobacco Use  . Smoking status: Never Smoker  . Smokeless tobacco: Never Used  Substance Use Topics  . Alcohol use: No    Alcohol/week: 0.0 oz   Objective:   BP 120/64 (BP Location: Left Arm, Patient Position: Sitting, Cuff Size: Large)   Pulse (!) 53   Temp (!) 97.4 F (36.3 C) (Oral)   Resp 18   Wt 190 lb (86.2 kg)   SpO2 96% Comment: room air  BMI 32.61 kg/m  Vitals:   08/27/17 0956  BP: 120/64  Pulse: (!) 53  Resp: 18  Temp: (!) 97.4 F (36.3 C)  TempSrc: Oral  SpO2: 96%  Weight: 190 lb (86.2 kg)     Physical Exam   General Appearance:    Alert, cooperative, no distress, obese  Eyes:    PERRL, conjunctiva/corneas clear, EOM's intact       Lungs:     Clear to auscultation bilaterally, respirations unlabored  Heart:    Regular rate and rhythm  Neurologic:   Awake, alert, oriented x 3. No  apparent focal neurological           defect.        Results for orders placed or performed in visit on 08/27/17  POCT HgB A1C  Result Value Ref Range   Hemoglobin A1C 7.6 (A) 4.0 - 5.6 %   HbA1c, POC (prediabetic range)  5.7 - 6.4 %   HbA1c, POC (controlled diabetic range)  0.0 - 7.0 %   Est. average glucose Bld gHb Est-mCnc 171        Assessment & Plan:     1. Chest pain, unspecified type High suspicious for cardiac angina. Extensively counseled on risk of MI and death if he does not follow up with cardiology referral as previously ordered. Given prescription for nitroglycerine to  take if chest pains due to resolve within a minute of rest. Advised to take daily 86m aspirin.  - nitroGLYCERIN (NITROSTAT) 0.4 MG SL tablet; Place 1 tablet (0.4 mg total) under the tongue every 5 (five) minutes as needed for chest pain.  Dispense: 50 tablet; Refill: 3 - Ambulatory referral to Cardiology  2. Controlled type 2 diabetes mellitus with diabetic nephropathy, without long-term current use of insulin ([place] Off metformin for unclear reasons.  A1c u[place]up from his baseline. Will add- POCT HgB A1C - sitaGLIPtin (JANUVIA) 100 MG tablet; Take 1 tablet (100 mg total) by mouth daily.  Dispense: 30 tablet; Refill: 5  3. Renovascular hypertension Well controlled.  Continue current medications.    4. Pure hypercholesterolemia refill- simvastatin (ZOCOR) 40 MG tablet; Take 1 tablet (40 mg total) by mouth daily.  Dispense: 30 tablet; Refill: 4  Return in about 3 months (around 11/27/2017).        DLelon Huh MD  [facility]

## 2017-09-09 ENCOUNTER — Other Ambulatory Visit: Payer: Self-pay | Admitting: Family Medicine

## 2017-09-20 DIAGNOSIS — R079 Chest pain, unspecified: Secondary | ICD-10-CM | POA: Diagnosis not present

## 2017-09-20 DIAGNOSIS — E118 Type 2 diabetes mellitus with unspecified complications: Secondary | ICD-10-CM | POA: Diagnosis not present

## 2017-09-25 DIAGNOSIS — E1129 Type 2 diabetes mellitus with other diabetic kidney complication: Secondary | ICD-10-CM | POA: Diagnosis not present

## 2017-09-25 DIAGNOSIS — R809 Proteinuria, unspecified: Secondary | ICD-10-CM | POA: Diagnosis not present

## 2017-09-25 DIAGNOSIS — I10 Essential (primary) hypertension: Secondary | ICD-10-CM | POA: Diagnosis not present

## 2017-10-19 ENCOUNTER — Other Ambulatory Visit: Payer: Self-pay | Admitting: Family Medicine

## 2017-10-21 DIAGNOSIS — R079 Chest pain, unspecified: Secondary | ICD-10-CM | POA: Diagnosis not present

## 2017-10-31 ENCOUNTER — Encounter: Payer: Self-pay | Admitting: Family Medicine

## 2017-10-31 ENCOUNTER — Ambulatory Visit (INDEPENDENT_AMBULATORY_CARE_PROVIDER_SITE_OTHER): Payer: Medicare Other | Admitting: Family Medicine

## 2017-10-31 VITALS — BP 116/68 | HR 58 | Temp 99.0°F | Resp 18 | Wt 186.0 lb

## 2017-10-31 DIAGNOSIS — R300 Dysuria: Secondary | ICD-10-CM

## 2017-10-31 DIAGNOSIS — R110 Nausea: Secondary | ICD-10-CM | POA: Diagnosis not present

## 2017-10-31 DIAGNOSIS — R310 Gross hematuria: Secondary | ICD-10-CM

## 2017-10-31 LAB — POCT URINALYSIS DIPSTICK
GLUCOSE UA: NEGATIVE
KETONES UA: NEGATIVE
Nitrite, UA: NEGATIVE
Protein, UA: POSITIVE — AB
SPEC GRAV UA: 1.025 (ref 1.010–1.025)
Urobilinogen, UA: 1 E.U./dL
pH, UA: 6 (ref 5.0–8.0)

## 2017-10-31 MED ORDER — METOCLOPRAMIDE HCL 5 MG PO TABS: 5.0000 mg | ORAL_TABLET | Freq: Four times a day (QID) | ORAL | 0 refills | Status: DC

## 2017-10-31 NOTE — Progress Notes (Signed)
Patient: [name]. Male    DOB: 07/28/1960   57 y.o.   MRN: [id number] Visit Date: 10/31/2017  Today's Provider: Lelon Huh, MD   Chief Complaint  Patient presents with  . Hematuria    x 3 days   Subjective:    Hematuria  This is a new problem. Episode onset: 3 days ago. The problem is unchanged. He describes the hematuria as gross hematuria. Obstructive symptoms do not include a weak stream. Associated symptoms include abdominal pain, dysuria, fever, nausea and vomiting. Pertinent negatives include no chills.  States he started feeling nauseated, dizzy, poor appetite and achy 2 days afterr nuclear chemical stress test by Dr. Humphrey Rolls on 10-21-2017. He has not gotten results yet from Dr. Chancy Milroy.     Allergies  Allergen Reactions  . Bee Venom Hives    All kinds of bees  . Other     Certain powders  . Sulfa Antibiotics      Current Outpatient Medications:  .  Cholecalciferol (VITAMIN D3) 50000 units CAPS, Take 50,000 Units by mouth every 30 (thirty) days. (Patient taking differently: Take 50,000 Units by mouth once a week. ), Disp: 12 capsule, Rfl: 0 .  Fluticasone Propionate, Inhal, (FLOVENT DISKUS) 100 MCG/BLIST AEPB, inhale 1 puff by mouth twice a day Rinse mouth after use, Disp: 60 each, Rfl: 5 .  glipiZIDE (GLUCOTROL XL) 2.5 MG 24 hr tablet, TAKE 1 TABLET BY MOUTH ONCE EVERY MORNING BEFORE BREAKFAST., Disp: 30 tablet, Rfl: 11 .  lisinopril (PRINIVIL,ZESTRIL) 20 MG tablet, Take 1 tablet (20 mg total) by mouth daily., Disp: 90 tablet, Rfl: 4 .  montelukast (SINGULAIR) 10 MG tablet, TAKE 1 TABLET BY MOUTH ONCE EVERY EVENING AT BEDTIME, Disp: 30 tablet, Rfl: 5 .  naproxen (NAPROSYN) 500 MG tablet, Take 1 tablet (500 mg total) by mouth 2 (two) times daily with a meal., Disp: 30 tablet, Rfl: 2 .  nitroGLYCERIN (NITROSTAT) 0.4 MG SL tablet, Place 1 tablet (0.4 mg total) under the tongue every 5 (five) minutes as needed for chest pain., Disp: 50 tablet, Rfl: 3 .   omeprazole (PRILOSEC) 20 MG capsule, TAKE 1 CAPSULE BY MOUTH ONCE DAILY, Disp: 30 capsule, Rfl: 12 .  ONE TOUCH ULTRA TEST test strip, TEST ONCE DAILY, Disp: 100 each, Rfl: 4 .  ONETOUCH DELICA LANCETS 40J MISC, TEST ONCE A DAY., Disp: 100 each, Rfl: 3 .  ONETOUCH DELICA LANCETS FINE MISC, TEST once daily, Disp: 100 each, Rfl: 3 .  PARoxetine (PAXIL) 20 MG tablet, Take 1 tablet (20 mg total) by mouth daily., Disp: 30 tablet, Rfl: 1 .  PROAIR HFA 108 (90 Base) MCG/ACT inhaler, inhale 2 puffs by mouth every 6 hours if needed for wheezing, Disp: 8.5 g, Rfl: 3 .  simvastatin (ZOCOR) 40 MG tablet, Take 1 tablet (40 mg total) by mouth daily., Disp: 30 tablet, Rfl: 4 .  sitaGLIPtin (JANUVIA) 100 MG tablet, Take 1 tablet (100 mg total) by mouth daily., Disp: 30 tablet, Rfl: 5 .  tiZANidine (ZANAFLEX) 4 MG capsule, Take 4 mg by mouth 3 (three) times daily as needed for muscle spasms., Disp: , Rfl:  .  triamcinolone cream (KENALOG) 0.5 %, apply to affected area twice a day, Disp: 30 g, Rfl: 5 .  Vitamin D, Ergocalciferol, (DRISDOL) 50000 units CAPS capsule, TAKE 1 CAPSULE BY MOUTH EVERY WEEK., Disp: 4 capsule, Rfl: 12  Review of Systems  Constitutional: Positive for fever. Negative for appetite change and chills.  Respiratory: Negative for chest tightness, shortness of breath and wheezing.   Cardiovascular: Negative for palpitations.  Gastrointestinal: Positive for abdominal pain, nausea and vomiting.       Reflux  Genitourinary: Positive for dysuria and hematuria.  Musculoskeletal: Positive for back pain and myalgias (pain in legs).  Neurological: Positive for dizziness, weakness and light-headedness.    Social History   Tobacco Use  . Smoking status: Never Smoker  . Smokeless tobacco: Never Used  Substance Use Topics  . Alcohol use: No    Alcohol/week: 0.0 standard drinks   Objective:   BP 116/68 (BP Location: Right Arm, Patient Position: Sitting, Cuff Size: Large)   Pulse (!) 58   Temp 99  F (37.2 C) (Oral)   Resp 18   Wt 186 lb (84.4 kg)   SpO2 98% Comment: room air  BMI 31.93 kg/m  Vitals:   10/31/17 0926  BP: 116/68  Pulse: (!) 58  Resp: 18  Temp: 99 F (37.2 C)  TempSrc: Oral  SpO2: 98%  Weight: 186 lb (84.4 kg)     Physical Exam   General Appearance:    Alert, cooperative, no distress  Eyes:    PERRL, conjunctiva/corneas clear, EOM's intact       Lungs:     Clear to auscultation bilaterally, respirations unlabored  Heart:    Regular rate and rhythm, no murmurs, rubs or gallops.   Neurologic:   Awake, alert, oriented x 3. No apparent focal neurological           defect.   Abd: Soft, non-tender, non-distended. No masses, no CVAT.      Results for orders placed or performed in visit on 10/31/17  POCT Urinalysis Dipstick  Result Value Ref Range   Color, UA amber    Clarity, UA clear    Glucose, UA Negative Negative   Bilirubin, UA small    Ketones, UA negative    Spec Grav, UA 1.025 1.010 - 1.025   Blood, UA Trace (non hemolyzed)    pH, UA 6.0 5.0 - 8.0   Protein, UA Positive (A) Negative   Urobilinogen, UA 1.0 0.2 or 1.0 E.U./dL   Nitrite, UA negative    Leukocytes, UA Trace (A) Negative   Appearance     Odor         Assessment & Plan:     1. Dysuria  - POCT Urinalysis Dipstick  2. Gross hematuria No RBC on u/a today, but red in color.  - Myoglobin, serum - Comprehensive metabolic panel - CBC  3. Nausea  - metoCLOPramide (REGLAN) 5 MG tablet; Take 1 tablet (5 mg total) by mouth 4 (four) times daily for 7 days.  Dispense: 28 tablet; Refill: 0       Lelon Huh, MD  [facility]

## 2017-11-01 LAB — CBC
Hematocrit: 36 % — ABNORMAL LOW (ref 37.5–51.0)
Hemoglobin: 11.9 g/dL — ABNORMAL LOW (ref 13.0–17.7)
MCH: 30.8 pg (ref 26.6–33.0)
MCHC: 33.1 g/dL (ref 31.5–35.7)
MCV: 93 fL (ref 79–97)
PLATELETS: 135 10*3/uL — AB (ref 150–450)
RBC: 3.86 x10E6/uL — AB (ref 4.14–5.80)
RDW: 14 % (ref 12.3–15.4)
WBC: 3.2 10*3/uL — AB (ref 3.4–10.8)

## 2017-11-01 LAB — COMPREHENSIVE METABOLIC PANEL
ALK PHOS: 95 IU/L (ref 39–117)
ALT: 47 IU/L — ABNORMAL HIGH (ref 0–44)
AST: 59 IU/L — AB (ref 0–40)
Albumin/Globulin Ratio: 1.6 (ref 1.2–2.2)
Albumin: 3.9 g/dL (ref 3.5–5.5)
BUN/Creatinine Ratio: 9 (ref 9–20)
BUN: 11 mg/dL (ref 6–24)
Bilirubin Total: 0.7 mg/dL (ref 0.0–1.2)
CO2: 20 mmol/L (ref 20–29)
CREATININE: 1.22 mg/dL (ref 0.76–1.27)
Calcium: 8.7 mg/dL (ref 8.7–10.2)
Chloride: 110 mmol/L — ABNORMAL HIGH (ref 96–106)
GFR calc Af Amer: 77 mL/min/{1.73_m2} (ref >59)
GFR calc non Af Amer: 67 mL/min/{1.73_m2} (ref >59)
GLOBULIN, TOTAL: 2.5 g/dL (ref 1.5–4.5)
GLUCOSE: 184 mg/dL — AB (ref 65–99)
Potassium: 3.9 mmol/L (ref 3.5–5.2)
SODIUM: 143 mmol/L (ref 134–144)
Total Protein: 6.4 g/dL (ref 6.0–8.5)

## 2017-11-01 LAB — MYOGLOBIN, SERUM: Myoglobin: 103 ng/mL — ABNORMAL HIGH (ref 28–72)

## 2017-11-04 ENCOUNTER — Telehealth: Payer: Self-pay

## 2017-11-04 ENCOUNTER — Other Ambulatory Visit: Payer: Self-pay | Admitting: Family Medicine

## 2017-11-04 ENCOUNTER — Telehealth: Payer: Self-pay | Admitting: Family Medicine

## 2017-11-04 ENCOUNTER — Other Ambulatory Visit: Payer: Self-pay

## 2017-11-04 DIAGNOSIS — M791 Myalgia, unspecified site: Secondary | ICD-10-CM | POA: Diagnosis not present

## 2017-11-04 DIAGNOSIS — R079 Chest pain, unspecified: Secondary | ICD-10-CM | POA: Diagnosis not present

## 2017-11-04 DIAGNOSIS — R0602 Shortness of breath: Secondary | ICD-10-CM | POA: Diagnosis not present

## 2017-11-04 DIAGNOSIS — D649 Anemia, unspecified: Secondary | ICD-10-CM

## 2017-11-04 NOTE — Telephone Encounter (Signed)
Pt advised.  He will probably get the labs done on Thursday this week.   Thanks,   -Jocelyn Lowery

## 2017-11-04 NOTE — Telephone Encounter (Signed)
Advised the pharmacist at [facility].  Also tried calling pt to explain to him he only needs to check his blood sugar once a day.  Left message.   Thanks,   -Mickel Baas

## 2017-11-04 NOTE — Telephone Encounter (Signed)
There is no medical reason for him to check more than one time a day. He is doing that on his own,  Not by my instructions.

## 2017-11-04 NOTE — Telephone Encounter (Signed)
Pt's wife advised.  She will let him know to only check his blood sugar once a day.   Thanks,   -Mickel Baas

## 2017-11-04 NOTE — Telephone Encounter (Signed)
See result note.

## 2017-11-04 NOTE — Telephone Encounter (Signed)
Please advise results?

## 2017-11-04 NOTE — Progress Notes (Signed)
Please print pended lab order. See result note.

## 2017-11-04 NOTE — Telephone Encounter (Signed)
Pharmacist from [facility] called stating pt is picking up his test too early for what the prescription says.  Pt informed the pharmacist that he is checking is sugar levels 2-3 times a day.  Please resend the RX to [facility] with the correct Siq.    Thanks,   -Mickel Baas

## 2017-11-04 NOTE — Telephone Encounter (Signed)
[name] would like to get Hubert's lab results from 10/31/17 TODAY please.

## 2017-11-04 NOTE — Telephone Encounter (Signed)
-----  Message from Birdie Sons, MD sent at 11/04/2017  1:32 PM EDT ----- Has elevated levels of myoglobin and mildly elevated liver functions. . This is usually from inflamed muscles or dehydration. Need to drink more water. Need to recheck labs and check a few additional thinks. Please print pended order and leave at lab. Should also stop simvastatin until everything has normalized.

## 2017-11-05 ENCOUNTER — Telehealth: Payer: Self-pay | Admitting: Family Medicine

## 2017-11-05 DIAGNOSIS — R740 Nonspecific elevation of levels of transaminase and lactic acid dehydrogenase [LDH]: Principal | ICD-10-CM

## 2017-11-05 DIAGNOSIS — R7401 Elevation of levels of liver transaminase levels: Secondary | ICD-10-CM

## 2017-11-05 LAB — COMPREHENSIVE METABOLIC PANEL
ALK PHOS: 124 IU/L — AB (ref 39–117)
ALT: 66 IU/L — ABNORMAL HIGH (ref 0–44)
AST: 58 IU/L — ABNORMAL HIGH (ref 0–40)
Albumin/Globulin Ratio: 1.4 (ref 1.2–2.2)
Albumin: 4.2 g/dL (ref 3.5–5.5)
BUN/Creatinine Ratio: 6 — ABNORMAL LOW (ref 9–20)
BUN: 7 mg/dL (ref 6–24)
Bilirubin Total: 0.4 mg/dL (ref 0.0–1.2)
CALCIUM: 9.3 mg/dL (ref 8.7–10.2)
CO2: 21 mmol/L (ref 20–29)
CREATININE: 1.18 mg/dL (ref 0.76–1.27)
Chloride: 107 mmol/L — ABNORMAL HIGH (ref 96–106)
GFR calc Af Amer: 80 mL/min/{1.73_m2} (ref >59)
GFR, EST NON AFRICAN AMERICAN: 70 mL/min/{1.73_m2} (ref >59)
GLOBULIN, TOTAL: 3 g/dL (ref 1.5–4.5)
Glucose: 105 mg/dL — ABNORMAL HIGH (ref 65–99)
Potassium: 4.3 mmol/L (ref 3.5–5.2)
SODIUM: 142 mmol/L (ref 134–144)
Total Protein: 7.2 g/dL (ref 6.0–8.5)

## 2017-11-05 LAB — SEDIMENTATION RATE: Sed Rate: 24 mm/hr (ref 0–30)

## 2017-11-05 LAB — CBC
Hematocrit: 39.8 % (ref 37.5–51.0)
Hemoglobin: 13.5 g/dL (ref 13.0–17.7)
MCH: 31.5 pg (ref 26.6–33.0)
MCHC: 33.9 g/dL (ref 31.5–35.7)
MCV: 93 fL (ref 79–97)
PLATELETS: 201 10*3/uL (ref 150–450)
RBC: 4.29 x10E6/uL (ref 4.14–5.80)
RDW: 12.9 % (ref 12.3–15.4)
WBC: 6.2 10*3/uL (ref 3.4–10.8)

## 2017-11-05 LAB — CK: Total CK: 197 U/L (ref 24–204)

## 2017-11-05 LAB — MYOGLOBIN, SERUM: Myoglobin: 73 ng/mL — ABNORMAL HIGH (ref 28–72)

## 2017-11-05 NOTE — Telephone Encounter (Signed)
-----  Message from Birdie Sons, MD sent at 11/05/2017  8:40 AM EDT ----- Myoglobin levels are back to normal, but liver functions are now elevated. Need right upper quadrant ultrasound due to elevated transaminases

## 2017-11-05 NOTE — Telephone Encounter (Signed)
Patient wants results from 11/04/17 and to get a copy of them.  Please call ASAP

## 2017-11-05 NOTE — Telephone Encounter (Signed)
Order in epic.

## 2017-11-05 NOTE — Telephone Encounter (Signed)
Pt advised.  Please refer.   Thanks,   -Mickel Baas

## 2017-11-07 DIAGNOSIS — R072 Precordial pain: Secondary | ICD-10-CM | POA: Diagnosis not present

## 2017-11-11 ENCOUNTER — Ambulatory Visit: Payer: Self-pay

## 2017-11-11 DIAGNOSIS — R079 Chest pain, unspecified: Secondary | ICD-10-CM | POA: Diagnosis not present

## 2017-11-11 DIAGNOSIS — R0602 Shortness of breath: Secondary | ICD-10-CM | POA: Diagnosis not present

## 2017-11-12 ENCOUNTER — Ambulatory Visit: Payer: Medicare Other

## 2017-11-13 ENCOUNTER — Other Ambulatory Visit: Payer: Self-pay | Admitting: Family Medicine

## 2017-11-21 ENCOUNTER — Ambulatory Visit: Payer: Medicare Other

## 2017-11-28 ENCOUNTER — Ambulatory Visit (INDEPENDENT_AMBULATORY_CARE_PROVIDER_SITE_OTHER): Payer: Medicare Other | Admitting: Family Medicine

## 2017-11-28 ENCOUNTER — Encounter: Payer: Self-pay | Admitting: Family Medicine

## 2017-11-28 ENCOUNTER — Ambulatory Visit
Admission: RE | Admit: 2017-11-28 | Discharge: 2017-11-28 | Disposition: A | Discharge status: Home / Self Care | Payer: Medicare Other | Source: Ambulatory Visit | Attending: Family Medicine | Admitting: Family Medicine

## 2017-11-28 VITALS — BP 100/62 | HR 56 | Temp 97.5°F | Resp 16 | Wt 187.0 lb

## 2017-11-28 DIAGNOSIS — R1013 Epigastric pain: Secondary | ICD-10-CM

## 2017-11-28 DIAGNOSIS — R740 Nonspecific elevation of levels of transaminase and lactic acid dehydrogenase [LDH]: Secondary | ICD-10-CM | POA: Insufficient documentation

## 2017-11-28 DIAGNOSIS — R7989 Other specified abnormal findings of blood chemistry: Secondary | ICD-10-CM | POA: Diagnosis not present

## 2017-11-28 DIAGNOSIS — R7401 Elevation of levels of liver transaminase levels: Secondary | ICD-10-CM

## 2017-11-28 DIAGNOSIS — R1011 Right upper quadrant pain: Secondary | ICD-10-CM | POA: Diagnosis not present

## 2017-11-28 NOTE — Patient Instructions (Signed)
Further f/u pending lab work. Dr. Caryn Section will notify you about your ultrasound. Do follow up with him tomorrow.

## 2017-11-28 NOTE — Progress Notes (Signed)
  Subjective:     Patient ID: Alex Metro., male   DOB: 1960-09-05, 57 y.o.   MRN: [id number] Chief Complaint  Patient presents with  . Allergic Reaction    Patient comes in office today with concerns of a possible allergic reaction after eating shrimp last night. Patient reports last night after eating shrimp he states that he began having a upset stomach, patient states that he checked his blood sugar and it was over 300. Patient reports symptoms of shoulder pain radiating into collar bone, blurred vision, feeling light headed, difficulty walking and swelling and drooping of his lower lip.    HPI He reports pain has improved and he had a normal bowel movement this AM. He is currently being worked up for elevated transaminases and has had a RUQ ultrasound this AM. He sees Dr Caryn Section tomorrow. Reports he has had pancreatitis multiple times.  Review of Systems     Objective:   Physical Exam  Constitutional: He appears well-developed and well-nourished. No distress.  Pulmonary/Chest: Breath sounds normal.  Abdominal: Bowel sounds are normal. He exhibits distension (mild). There is tenderness (RUQ and epigastric).  Psychiatric:  anxious       Assessment:    1. Right upper quadrant abdominal pain: Ultrasound result pending  2. Epigastric pain: currently on Janvia - Lipase    Plan:    F/u with Dr.Fisher as scheduled.

## 2017-11-29 ENCOUNTER — Telehealth: Payer: Self-pay

## 2017-11-29 ENCOUNTER — Ambulatory Visit (INDEPENDENT_AMBULATORY_CARE_PROVIDER_SITE_OTHER): Payer: Medicare Other | Admitting: Family Medicine

## 2017-11-29 ENCOUNTER — Encounter: Payer: Self-pay | Admitting: Family Medicine

## 2017-11-29 VITALS — BP 122/78 | HR 58 | Temp 97.9°F | Resp 20 | Ht 64.0 in | Wt 188.0 lb

## 2017-11-29 DIAGNOSIS — Z23 Encounter for immunization: Secondary | ICD-10-CM | POA: Diagnosis not present

## 2017-11-29 DIAGNOSIS — E1121 Type 2 diabetes mellitus with diabetic nephropathy: Secondary | ICD-10-CM | POA: Diagnosis not present

## 2017-11-29 DIAGNOSIS — T7840XA Allergy, unspecified, initial encounter: Secondary | ICD-10-CM

## 2017-11-29 DIAGNOSIS — K824 Cholesterolosis of gallbladder: Secondary | ICD-10-CM | POA: Diagnosis not present

## 2017-11-29 DIAGNOSIS — M170 Bilateral primary osteoarthritis of knee: Secondary | ICD-10-CM | POA: Diagnosis not present

## 2017-11-29 LAB — POCT GLYCOSYLATED HEMOGLOBIN (HGB A1C)
ESTIMATED AVERAGE GLUCOSE: 171
Hemoglobin A1C: 7.6 % — AB (ref 4.0–5.6)

## 2017-11-29 LAB — LIPASE: Lipase: 25 U/L (ref 13–78)

## 2017-11-29 MED ORDER — EPINEPHRINE 0.3 MG/0.3ML IJ SOAJ: 0.3000 mg | Freq: Once | INTRAMUSCULAR | 0 refills | Status: DC | PRN

## 2017-11-29 MED ORDER — CANAGLIFLOZIN 100 MG PO TABS: 100.0000 mg | ORAL_TABLET | Freq: Every day | ORAL | 1 refills | Status: DC

## 2017-11-29 NOTE — Telephone Encounter (Signed)
Wife has been advised. KW

## 2017-11-29 NOTE — Progress Notes (Signed)
Patient: [name]. Male    DOB: 03-30-1960   57 y.o.   MRN: [id number] Visit Date: 11/29/2017  Today's Provider: Lelon Huh, MD   Chief Complaint  Patient presents with  . Diabetes  . Hypertension  . Hyperlipidemia   Subjective:    HPI   Diabetes Mellitus Type II, Follow-up:   Lab Results  Component Value Date   HGBA1C 7.6 (A) 11/29/2017   HGBA1C 7.6 (A) 08/27/2017   HGBA1C 6.8 (H) 05/22/2017   Last seen for diabetes 3 months ago.  Management since then includes; added Januvia 100 mg qd. He reports good compliance with treatment. He is not having side effects.  Current symptoms include none and have been stable. Home blood sugar records: fasting range: 200s  Episodes of hypoglycemia? no   Current Insulin Regimen: none Most Recent Eye Exam: due Weight trend: stable Prior visit with dietician: no Current diet: well balanced Current exercise: cardiovascular workout on exercise equipment and no regular exercise   Hypertension, follow-up:  BP Readings from Last 3 Encounters:  11/29/17 122/78  11/28/17 100/62  10/31/17 116/68    He was last seen for hypertension 3 months ago.  BP at that visit was 120/64. Management since that visit includes; no changes.He reports good compliance with treatment. He is not having side effects.  He is not exercising. He is adherent to low salt diet.   Outside blood pressures are not being checked. He is experiencing lower extremity edema.  Patient denies exertional chest pressure/discomfort, near-syncope and palpitations.   Cardiovascular risk factors include diabetes mellitus and dyslipidemia.    Lipid/Cholesterol, Follow-up:   Last seen for this 3 months ago.  Management since that visit includes; no changes. Refilled simvastatin.  Last Lipid Panel:    Component Value Date/Time   CHOL 142 05/22/2017 1112   TRIG 244 (H) 05/22/2017 1112   HDL 34 (L) 05/22/2017 1112   CHOLHDL 4.2 05/22/2017 1112     LDLCALC 59 05/22/2017 1112    He reports good compliance with treatment. He is not having side effects.   Wt Readings from Last 3 Encounters:  11/29/17 188 lb (85.3 kg)  11/28/17 187 lb (84.8 kg)  10/31/17 186 lb (84.4 kg)    He is also here to follow up on abdominal u/s done yesterday to follow up on gallbladder polyps and fatty liver. Is not currently having any abdominal pain.   He also reports episodes of having allergic reaction consisting of having itching and wheezing after being exposed to seafood and requests prescription for epipen.  He also reports he has had worsening pains in both knees not relieved by Tylenol.   Allergies  Allergen Reactions  . Bee Venom Hives    All kinds of bees  . Other     Certain powders  . Sulfa Antibiotics      Current Outpatient Medications:  .  Cholecalciferol (VITAMIN D3) 50000 units CAPS, Take 50,000 Units by mouth every 30 (thirty) days. (Patient taking differently: Take 50,000 Units by mouth once a week. ), Disp: 12 capsule, Rfl: 0 .  Fluticasone Propionate, Inhal, (FLOVENT DISKUS) 100 MCG/BLIST AEPB, inhale 1 puff by mouth twice a day Rinse mouth after use, Disp: 60 each, Rfl: 5 .  glipiZIDE (GLUCOTROL XL) 2.5 MG 24 hr tablet, TAKE 1 TABLET BY MOUTH ONCE EVERY MORNING BEFORE BREAKFAST., Disp: 30 tablet, Rfl: 11 .  lisinopril (PRINIVIL,ZESTRIL) 20 MG tablet, Take 1 tablet (20 mg total)  by mouth daily., Disp: 90 tablet, Rfl: 4 .  montelukast (SINGULAIR) 10 MG tablet, TAKE 1 TABLET BY MOUTH ONCE EVERY EVENING AT BEDTIME, Disp: 30 tablet, Rfl: 5 .  naproxen (NAPROSYN) 500 MG tablet, Take 1 tablet (500 mg total) by mouth 2 (two) times daily with a meal., Disp: 30 tablet, Rfl: 2 .  nitroGLYCERIN (NITROSTAT) 0.4 MG SL tablet, Place 1 tablet (0.4 mg total) under the tongue every 5 (five) minutes as needed for chest pain., Disp: 50 tablet, Rfl: 3 .  omeprazole (PRILOSEC) 20 MG capsule, TAKE 1 CAPSULE BY MOUTH ONCE DAILY, Disp: 30 capsule,  Rfl: 12 .  ONE TOUCH ULTRA TEST test strip, TEST ONCE DAILY, Disp: 100 each, Rfl: 4 .  ONETOUCH DELICA LANCETS 57D MISC, TEST ONCE A DAY., Disp: 100 each, Rfl: 3 .  ONETOUCH DELICA LANCETS FINE MISC, TEST once daily, Disp: 100 each, Rfl: 3 .  PARoxetine (PAXIL) 20 MG tablet, Take 1 tablet (20 mg total) by mouth daily., Disp: 30 tablet, Rfl: 1 .  PROAIR HFA 108 (90 Base) MCG/ACT inhaler, INHALE 2 PUFFS BY MOUTH EVERY 6 HOURS IF NEEDED FOR COUGH OR WHEEZING., Disp: 8.5 g, Rfl: 3 .  simvastatin (ZOCOR) 40 MG tablet, Take 1 tablet (40 mg total) by mouth daily., Disp: 30 tablet, Rfl: 4 .  sitaGLIPtin (JANUVIA) 100 MG tablet, Take 1 tablet (100 mg total) by mouth daily., Disp: 30 tablet, Rfl: 5 .  tiZANidine (ZANAFLEX) 4 MG capsule, Take 4 mg by mouth 3 (three) times daily as needed for muscle spasms., Disp: , Rfl:  .  triamcinolone cream (KENALOG) 0.5 %, apply to affected area twice a day, Disp: 30 g, Rfl: 5 .  Vitamin D, Ergocalciferol, (DRISDOL) 50000 units CAPS capsule, TAKE 1 CAPSULE BY MOUTH EVERY WEEK., Disp: 4 capsule, Rfl: 12 .  metoCLOPramide (REGLAN) 5 MG tablet, Take 1 tablet (5 mg total) by mouth 4 (four) times daily for 7 days., Disp: 28 tablet, Rfl: 0  Review of Systems  Constitutional: Negative for appetite change, chills and fever.  Respiratory: Negative for chest tightness, shortness of breath and wheezing.   Cardiovascular: Negative for chest pain and palpitations.  Gastrointestinal: Negative for abdominal pain, nausea and vomiting.    Social History   Tobacco Use  . Smoking status: Never Smoker  . Smokeless tobacco: Never Used  Substance Use Topics  . Alcohol use: No    Alcohol/week: 0.0 standard drinks   Objective:   BP 122/78 (BP Location: Right Arm, Patient Position: Sitting, Cuff Size: Normal)   Pulse (!) 58   Temp 97.9 F (36.6 C)   Resp 20   Ht 5' 4" (1.626 m)   Wt 188 lb (85.3 kg)   SpO2 99%   BMI 32.27 kg/m  Vitals:   11/29/17 1446  BP: 122/78  Pulse:  (!) 58  Resp: 20  Temp: 97.9 F (36.6 C)  SpO2: 99%  Weight: 188 lb (85.3 kg)  Height: 5' 4" (1.626 m)     Physical Exam  General Appearance:    Alert, cooperative, no distress, obese  Eyes:    PERRL, conjunctiva/corneas clear, EOM's intact       Lungs:     Clear to auscultation bilaterally, respirations unlabored  Heart:    Regular rate and rhythm  Neurologic:   Awake, alert, oriented x 3. No apparent focal neurological           defect.       Results for orders placed or  performed in visit on 11/29/17  POCT glycosylated hemoglobin (Hb A1C)  Result Value Ref Range   Hemoglobin A1C 7.6 (A) 4.0 - 5.6 %   HbA1c POC (<> result, manual entry)     HbA1c, POC (prediabetic range)     HbA1c, POC (controlled diabetic range)     Est. average glucose Bld gHb Est-mCnc 171        Assessment & Plan:     1. Controlled type 2 diabetes mellitus with diabetic nephropathy, without long-term current use of insulin (HCC) A1c is rising. Will add - canagliflozin (INVOKANA) 100 MG TABS tablet; Take 1 tablet (100 mg total) by mouth daily before breakfast.  Dispense: 30 tablet; Refill: 1  2. Primary osteoarthritis of both knees  - Ambulatory referral to Orthopedic Surgery  3. Gall bladder polyp Reviewed u/s report with patient and anticipate follow up u/s in 1 year  4. Flu vaccine need  - Flu Vaccine QUAD 6+ mos PF IM (Fluarix Quad PF)  5. Need for hepatitis B vaccination  - Hepatitis B vaccine adult IM  6. Allergic reaction, initial encounter Counseled patient importance of allergen avoidance and may use epipen if he develops diffuse rash and itching with any respiratory sx.  - EPINEPHrine 0.3 mg/0.3 mL IJ SOAJ injection; Inject 0.3 mLs (0.3 mg total) into the muscle once as needed for up to 1 dose.  Dispense: 1 Device; Refill: 0       Lelon Huh, MD  [facility]

## 2017-11-29 NOTE — Telephone Encounter (Signed)
[id number]

## 2017-11-29 NOTE — Telephone Encounter (Signed)
-----  Message from Carmon Ginsberg, [place] sent at 11/29/2017  7:18 AM EDT ----- Pancreas labs ok. Follow up with Dr. Caryn Section as scheduled.

## 2017-12-19 ENCOUNTER — Other Ambulatory Visit: Payer: Self-pay | Admitting: Family Medicine

## 2017-12-19 DIAGNOSIS — E7800 Pure hypercholesterolemia, unspecified: Secondary | ICD-10-CM

## 2017-12-19 DIAGNOSIS — R062 Wheezing: Secondary | ICD-10-CM

## 2017-12-20 ENCOUNTER — Other Ambulatory Visit: Payer: Self-pay

## 2017-12-20 NOTE — Patient Outreach (Signed)
[facility]) Care Management  12/20/2017  [name]. 01/02/1961 [id number]   Medication Adherence to [name] left a message for patient to call back, patient is due on Lisinopril 20 mg and Simvastatin 40 mg under [place].   [place] Management Direct Dial [phone]  Fax [phone] [email or web address]

## 2017-12-24 ENCOUNTER — Ambulatory Visit: Payer: Medicare Other

## 2018-01-20 ENCOUNTER — Other Ambulatory Visit: Payer: Self-pay | Admitting: Family Medicine

## 2018-01-20 DIAGNOSIS — E1121 Type 2 diabetes mellitus with diabetic nephropathy: Secondary | ICD-10-CM

## 2018-02-03 ENCOUNTER — Ambulatory Visit: Payer: Self-pay | Admitting: Family Medicine

## 2018-03-31 ENCOUNTER — Ambulatory Visit (INDEPENDENT_AMBULATORY_CARE_PROVIDER_SITE_OTHER): Payer: Medicare Other | Admitting: Family Medicine

## 2018-03-31 ENCOUNTER — Encounter: Payer: Self-pay | Admitting: Family Medicine

## 2018-03-31 VITALS — BP 115/69 | HR 54 | Temp 97.7°F | Resp 16 | Wt 184.0 lb

## 2018-03-31 DIAGNOSIS — J4530 Mild persistent asthma, uncomplicated: Secondary | ICD-10-CM

## 2018-03-31 DIAGNOSIS — E1140 Type 2 diabetes mellitus with diabetic neuropathy, unspecified: Secondary | ICD-10-CM | POA: Diagnosis not present

## 2018-03-31 DIAGNOSIS — M170 Bilateral primary osteoarthritis of knee: Secondary | ICD-10-CM | POA: Diagnosis not present

## 2018-03-31 DIAGNOSIS — M7551 Bursitis of right shoulder: Secondary | ICD-10-CM

## 2018-03-31 LAB — POCT GLYCOSYLATED HEMOGLOBIN (HGB A1C)
ESTIMATED AVERAGE GLUCOSE: 171
HEMOGLOBIN A1C: 7.6 % — AB (ref 4.0–5.6)

## 2018-03-31 MED ORDER — GABAPENTIN 300 MG PO CAPS: ORAL_CAPSULE | ORAL | 3 refills | Status: DC

## 2018-03-31 MED ORDER — FLUTICASONE PROPIONATE HFA 110 MCG/ACT IN AERO: 1.0000 | INHALATION_SPRAY | Freq: Two times a day (BID) | RESPIRATORY_TRACT | 12 refills | Status: DC

## 2018-03-31 NOTE — Patient Instructions (Signed)
[name]  Please bring all of your medications to every appointment so we can make sure that our medication list is the same as yours.    The CDC recommends two doses of Shingrix (the shingles vaccine) separated by 2 to 6 months for adults age 56 years and older. I recommend checking with your insurance plan regarding coverage for this vaccine.

## 2018-03-31 NOTE — Progress Notes (Signed)
Patient: [name]. Male    DOB: 08-29-1960   58 y.o.   MRN: [id number] Visit Date: 03/31/2018  Today's Provider: Lelon Huh, MD   Chief Complaint  Patient presents with  . Diabetes  . Hypertension   Subjective:     Diabetes Mellitus Type II, Follow-up:   Lab Results  Component Value Date   HGBA1C 7.6 (A) 11/29/2017   HGBA1C 7.6 (A) 08/27/2017   HGBA1C 6.8 (H) 05/22/2017    Last seen for diabetes 4 months ago.  Management since then includes adding Invokana 139m before breakfast. He reports good compliance with treatment. He is not having side effects.  Current symptoms include none and have been stable. Home blood sugar records: fasting range: 200's  Episodes of hypoglycemia? no   Current Insulin Regimen: none Most Recent Eye Exam: 07/01/2017 Weight trend: fluctuating a bit Prior visit with dietician: no Current diet: in general, an "unhealthy" diet Current exercise: none  Pertinent Labs:    Component Value Date/Time   CHOL 142 05/22/2017 1112   TRIG 244 (H) 05/22/2017 1112   HDL 34 (L) 05/22/2017 1112   LDLCALC 59 05/22/2017 1112   CREATININE 1.18 11/04/2017 1433    Wt Readings from Last 3 Encounters:  03/31/18 184 lb (83.5 kg)  11/29/17 188 lb (85.3 kg)  11/28/17 187 lb (84.8 kg)    ------------------------------------------------------------------------  Hypertension, follow-up:  BP Readings from Last 3 Encounters:  03/31/18 115/69  11/29/17 122/78  11/28/17 100/62    He was last seen for hypertension 7 months ago.  BP at that visit was 120/64. Management since that visit includes no changes. He reports good compliance with treatment. He is not having side effects.  He is not exercising. He is not adherent to low salt diet.   Outside blood pressures are not being checked. He is experiencing dyspnea and fatigue.  Patient denies exertional chest pressure/discomfort, irregular heart beat, lower extremity edema,  near-syncope, orthopnea, palpitations, paroxysmal nocturnal dyspnea, syncope and tachypnea.   Cardiovascular risk factors include diabetes mellitus, hypertension and male gender.  Use of agents associated with hypertension: none.     Weight trend: fluctuating a bit Wt Readings from Last 3 Encounters:  03/31/18 184 lb (83.5 kg)  11/29/17 188 lb (85.3 kg)  11/28/17 187 lb (84.8 kg)    Current diet: in general, an "unhealthy" diet  ------------------------------------------------------------------------  He also complains or persistent burning pains in both legs as well as recurrent pain across chest and abdomen. He was evaluated by cardiology in the fall and advised chest pains are non-cardiac. He does have history of fatty liver and gallbladder polyps versus gallstones with last u/s in September 2019.  He has also had persistent pain in his knees and right shoulder. [facility] but was required to pay copay up front which he does not have. He states he is on new insurance plan this year.   Allergies  Allergen Reactions  . Bee Venom Hives    All kinds of bees  . Other     Certain powders  . Sulfa Antibiotics      Current Outpatient Medications:  .  Cholecalciferol (VITAMIN D3) 50000 units CAPS, Take 50,000 Units by mouth every 30 (thirty) days. (Patient taking differently: Take 50,000 Units by mouth once a week. ), Disp: 12 capsule, Rfl: 0 .  EPINEPHrine 0.3 mg/0.3 mL IJ SOAJ injection, Inject 0.3 mLs (0.3 mg total)  into the muscle once as needed for up to 1 dose., Disp: 1 Device, Rfl: 0 .  glipiZIDE (GLUCOTROL XL) 2.5 MG 24 hr tablet, TAKE 1 TABLET BY MOUTH ONCE EVERY MORNING BEFORE BREAKFAST., Disp: 30 tablet, Rfl: 11 .  INVOKANA 100 MG TABS tablet, TAKE 1 TABLET BY MOUTH ONCE DAILY BEFOREBREAKFAST, Disp: 30 tablet, Rfl: 11 .  lisinopril (PRINIVIL,ZESTRIL) 20 MG tablet, Take 1 tablet (20 mg total) by mouth daily., Disp: 90 tablet, Rfl:  4 .  montelukast (SINGULAIR) 10 MG tablet, TAKE 1 TABLET BY MOUTH ONCE EVERY EVENING AT BEDTIME., Disp: 30 tablet, Rfl: 11 .  naproxen (NAPROSYN) 500 MG tablet, Take 1 tablet (500 mg total) by mouth 2 (two) times daily with a meal., Disp: 30 tablet, Rfl: 2 .  nitroGLYCERIN (NITROSTAT) 0.4 MG SL tablet, Place 1 tablet (0.4 mg total) under the tongue every 5 (five) minutes as needed for chest pain., Disp: 50 tablet, Rfl: 3 .  omeprazole (PRILOSEC) 20 MG capsule, TAKE 1 CAPSULE BY MOUTH ONCE DAILY, Disp: 30 capsule, Rfl: 12 .  ONE TOUCH ULTRA TEST test strip, TEST ONCE DAILY, Disp: 100 each, Rfl: 4 .  ONETOUCH DELICA LANCETS 96V MISC, TEST ONCE A DAY., Disp: 100 each, Rfl: 3 .  ONETOUCH DELICA LANCETS FINE MISC, TEST once daily, Disp: 100 each, Rfl: 3 .  PROAIR HFA 108 (90 Base) MCG/ACT inhaler, INHALE 2 PUFFS BY MOUTH EVERY 6 HOURS IF NEEDED FOR COUGH OR WHEEZING., Disp: 8.5 g, Rfl: 3 .  simvastatin (ZOCOR) 40 MG tablet, TAKE 1 TABLET BY MOUTH ONCE DAILY, Disp: 30 tablet, Rfl: 11 .  triamcinolone cream (KENALOG) 0.5 %, apply to affected area twice a day, Disp: 30 g, Rfl: 5 .  Vitamin D, Ergocalciferol, (DRISDOL) 50000 units CAPS capsule, TAKE 1 CAPSULE BY MOUTH EVERY WEEK., Disp: 4 capsule, Rfl: 12 .  Fluticasone Propionate, Inhal, (FLOVENT DISKUS) 100 MCG/BLIST AEPB, inhale 1 puff by mouth twice a day Rinse mouth after use (Patient not taking: Reported on 03/31/2018), Disp: 60 each, Rfl: 5 .  PARoxetine (PAXIL) 20 MG tablet, Take 1 tablet (20 mg total) by mouth daily. (Patient not taking: Reported on 03/31/2018), Disp: 30 tablet, Rfl: 1 .  tiZANidine (ZANAFLEX) 4 MG capsule, Take 4 mg by mouth 3 (three) times daily as needed for muscle spasms., Disp: , Rfl:   Review of Systems  Constitutional: Negative for appetite change, chills, fatigue and fever.  HENT: Negative for congestion, ear pain, hearing loss, nosebleeds and trouble swallowing.   Eyes: Negative for pain and visual disturbance.   Respiratory: Positive for shortness of breath. Negative for cough and chest tightness.   Cardiovascular: Negative for chest pain, palpitations and leg swelling.  Gastrointestinal: Positive for abdominal pain. Negative for blood in stool, constipation, diarrhea, nausea and vomiting.  Endocrine: Negative for polydipsia, polyphagia and polyuria.  Genitourinary: Negative for dysuria and flank pain.  Musculoskeletal: Positive for arthralgias and back pain. Negative for joint swelling, myalgias and neck stiffness.  Skin: Negative for color change, rash and wound.  Neurological: Negative for dizziness, tremors, seizures, speech difficulty, weakness, light-headedness and headaches.  Psychiatric/Behavioral: Negative for behavioral problems, confusion, decreased concentration, dysphoric mood and sleep disturbance. The patient is not nervous/anxious.   All other systems reviewed and are negative.   Social History   Tobacco Use  . Smoking status: Never Smoker  . Smokeless tobacco: Never Used  Substance Use Topics  . Alcohol use: No    Alcohol/week: 0.0 standard drinks  Objective:   BP 115/69 (BP Location: Left Arm, Patient Position: Sitting, Cuff Size: Large)   Pulse (!) 54   Temp 97.7 F (36.5 C) (Oral)   Resp 16   Wt 184 lb (83.5 kg)   SpO2 97% Comment: room air  BMI 31.58 kg/m  Vitals:   03/31/18 1525  BP: 115/69  Pulse: (!) 54  Resp: 16  Temp: 97.7 F (36.5 C)  TempSrc: Oral  SpO2: 97%  Weight: 184 lb (83.5 kg)     Physical Exam  General Appearance:    Alert, cooperative, no distress, obese  Eyes:    PERRL, conjunctiva/corneas clear, EOM's intact       Lungs:     Clear to auscultation bilaterally, respirations unlabored  Heart:    Regular rate and rhythm  Neurologic:   Awake, alert, oriented x 3. No apparent focal neurological           defect.       Results for orders placed or performed in visit on 03/31/18  POCT HgB A1C  Result Value Ref Range   Hemoglobin  A1C 7.6 (A) 4.0 - 5.6 %   HbA1c POC (<> result, manual entry)     HbA1c, POC (prediabetic range)     HbA1c, POC (controlled diabetic range)     Est. average glucose Bld gHb Est-mCnc 171        Assessment & Plan    1. Type 2 diabetes mellitus with diabetic neuropathy, without long-term current use of insulin (HCC) Diabetes stable, Continue current medications.  For neuropathy will start- gabapentin (NEURONTIN) 300 MG capsule; Take one tablet at bedtime for one week, then take one twice daily  Dispense: 60 capsule; Refill: 3  2. Primary osteoarthritis of both knees - Ambulatory referral to Orthopedic Surgery  3. Subacromial bursitis of right shoulder joint  - Ambulatory referral to Orthopedic Surgery  4. Mild persistent asthma.  He reports he is using albuterol inhaler about twice a day every day , but not working as well as it used to. Advised if he needs rescue inhaler daily then he needs to be on maintenance inhaler.     Lelon Huh, MD  [facility]

## 2018-04-09 ENCOUNTER — Other Ambulatory Visit: Payer: Self-pay

## 2018-04-09 NOTE — Patient Outreach (Signed)
[facility]) Care Management  04/09/2018  Compton Brigance 29-Nov-1960 [id number]   Medication Adherence call to [name] left a message for patient to call back patient is due on Glipizide Er 2.5 mg,Invokana 100 mg Simvastatin 40 mg and Lisinopril 20 mg.[place] said last pick up from pharmacy was on 03/25/18 for a 30 days supply. [name] is showing past due under [place].   [facility] Management Direct Dial [phone]  Fax [phone] [email or web address]

## 2018-04-15 ENCOUNTER — Telehealth: Payer: Self-pay | Admitting: Family Medicine

## 2018-04-15 NOTE — Telephone Encounter (Signed)
  FYI...  Mrs. Cruise Baumgardner pt should start taking Gabapentin 340m twice a day after seven days.  I also her advised her that it can be sedating so he should not drive if it is causing him to be sleepy.   Thanks,   -LMickel Baas

## 2018-04-15 NOTE — Telephone Encounter (Signed)
Patient's wife has questions about gabapentin (NEURONTIN) 300 MG capsule.  She is wanting to know how he is supposed to take it after the first week and if it will affect him driving.

## 2018-05-21 ENCOUNTER — Other Ambulatory Visit: Payer: Self-pay | Admitting: Family Medicine

## 2018-05-21 DIAGNOSIS — Z794 Long term (current) use of insulin: Principal | ICD-10-CM

## 2018-05-21 DIAGNOSIS — E119 Type 2 diabetes mellitus without complications: Secondary | ICD-10-CM

## 2018-05-22 ENCOUNTER — Telehealth: Payer: Self-pay

## 2018-05-22 NOTE — Telephone Encounter (Signed)
Called pt to schedule AWV for this year. LMTCB my direct # or call the main line and ask for Yuval Rubens. Pt needs a 1 hr apt.  -MM

## 2018-06-02 NOTE — Telephone Encounter (Signed)
Scheduled AWV for 08/19/18 by ?[name] Blocked off for 1 hour apt. -MM

## 2018-06-06 ENCOUNTER — Other Ambulatory Visit: Payer: Self-pay | Admitting: Family Medicine

## 2018-06-24 ENCOUNTER — Other Ambulatory Visit: Payer: Self-pay | Admitting: Family Medicine

## 2018-06-24 DIAGNOSIS — E1140 Type 2 diabetes mellitus with diabetic neuropathy, unspecified: Secondary | ICD-10-CM

## 2018-06-26 ENCOUNTER — Ambulatory Visit: Payer: Self-pay | Admitting: Family Medicine

## 2018-06-27 ENCOUNTER — Ambulatory Visit: Payer: Self-pay | Admitting: Family Medicine

## 2018-07-30 ENCOUNTER — Other Ambulatory Visit: Payer: Self-pay

## 2018-07-30 NOTE — Patient Outreach (Signed)
[facility]) Care Management  07/30/2018  Ananias Kolander. 10/30/1960 [id number]   Medication Adherence call to Mr. Dory Verdun HIP[employer] Compliant Voice message left with a call back number. Mr. Bessinger is showing past due on Lisinopril 20 mg,Glipizide Er 2.5 mg, Invokana 100 mg and Simvastatin 40 mg under [place].   [facility] Management Direct Dial [phone]  Fax [phone] [email or web address]

## 2018-08-04 ENCOUNTER — Other Ambulatory Visit: Payer: Self-pay

## 2018-08-04 ENCOUNTER — Encounter: Payer: Self-pay | Admitting: Family Medicine

## 2018-08-04 ENCOUNTER — Ambulatory Visit (INDEPENDENT_AMBULATORY_CARE_PROVIDER_SITE_OTHER): Payer: Medicare Other | Admitting: Family Medicine

## 2018-08-04 ENCOUNTER — Ambulatory Visit: Payer: Self-pay | Admitting: Family Medicine

## 2018-08-04 VITALS — BP 122/74 | HR 60 | Temp 97.7°F | Resp 16 | Wt 184.0 lb

## 2018-08-04 DIAGNOSIS — I10 Essential (primary) hypertension: Secondary | ICD-10-CM | POA: Diagnosis not present

## 2018-08-04 DIAGNOSIS — E1140 Type 2 diabetes mellitus with diabetic neuropathy, unspecified: Secondary | ICD-10-CM

## 2018-08-04 DIAGNOSIS — K760 Fatty (change of) liver, not elsewhere classified: Secondary | ICD-10-CM

## 2018-08-04 DIAGNOSIS — R1011 Right upper quadrant pain: Secondary | ICD-10-CM | POA: Diagnosis not present

## 2018-08-04 LAB — POCT GLYCOSYLATED HEMOGLOBIN (HGB A1C)
Est. average glucose Bld gHb Est-mCnc: 177
Hemoglobin A1C: 7.8 % — AB (ref 4.0–5.6)

## 2018-08-04 MED ORDER — CANAGLIFLOZIN 300 MG PO TABS: 300.0000 mg | ORAL_TABLET | Freq: Every day | ORAL | 11 refills | Status: DC

## 2018-08-04 NOTE — Patient Instructions (Signed)
[name]  Please review the attached list of medications and notify my office if there are any errors.   . Please bring all of your medications to every appointment so we can make sure that our medication list is the same as yours.

## 2018-08-04 NOTE — Progress Notes (Signed)
Patient: [name]. Male    DOB: [birth year]   57 y.o.   MRN: [id number] Visit Date: 08/04/2018  Today's Provider: Lelon Huh, MD   Chief Complaint  Patient presents with  . Diabetes   Subjective:     HPI  Diabetes Mellitus Type II, Follow-up:   Lab Results  Component Value Date   HGBA1C 7.8 (A) 08/04/2018   HGBA1C 7.6 (A) 03/31/2018   HGBA1C 7.6 (A) 11/29/2017    Last seen for diabetes 4 months ago.  Management since then includes starting gabapentin 358m BID to help with neuropathy.  He reports good compliance with treatment. He is not having side effects.  Current symptoms include none and have been stable. Home blood sugar records: fasting range: 200s and trend: stable  Episodes of hypoglycemia? yes - patient reports that about 1 month ago, his blood sugar dropped to 52. He reports that he has not had any more episodes since then.   Most Recent Eye Exam: up to date Weight trend: stable Prior visit with dietician: No Current exercise: no regular exercise Current diet habits: well balanced  Pertinent Labs:    Component Value Date/Time   CHOL 142 05/22/2017 1112   TRIG 244 (H) 05/22/2017 1112   HDL 34 (L) 05/22/2017 1112   LDLCALC 59 05/22/2017 1112   CREATININE 1.18 11/04/2017 1433    Wt Readings from Last 3 Encounters:  08/04/18 184 lb (83.5 kg)  03/31/18 184 lb (83.5 kg)  11/29/17 188 lb (85.3 kg)     Hypertension, follow-up:  BP Readings from Last 3 Encounters:  08/04/18 122/74  03/31/18 115/69  11/29/17 122/78    He was last seen for hypertension 4 months ago.  BP at that visit was 115/69. Management since that visit includes no changes. He reports good compliance with treatment. He is not having side effects.  He is not exercising. He is adherent to low salt diet.   Outside blood pressures are checked occasionally. He is experiencing none.  Patient denies exertional chest pressure/discomfort, lower extremity edema  and palpitations.    Knee pain: Patient reports that he has had pain in his knees for about 2 months. He denies any injuries. He reports that this is a recurrent issue. He has not taken anything OTC for his symptoms. Has been seen by orthopedics in the past.   He has complains of persistent LUQ for several months and getting worse. He did have RUQ ultrasound last year with findings of fatty liver and non obstructing gallstones.   Allergies  Allergen Reactions  . Bee Venom Hives    All kinds of bees  . Other     Certain powders  . Sulfa Antibiotics      Current Outpatient Medications:  .  Cholecalciferol (VITAMIN D3) 50000 units CAPS, Take 50,000 Units by mouth every 30 (thirty) days. (Patient taking differently: Take 50,000 Units by mouth once a week. ), Disp: 12 capsule, Rfl: 0 .  EPINEPHrine 0.3 mg/0.3 mL IJ SOAJ injection, Inject 0.3 mLs (0.3 mg total) into the muscle once as needed for up to 1 dose., Disp: 1 Device, Rfl: 0 .  fluticasone (FLOVENT HFA) 110 MCG/ACT inhaler, Inhale 1 puff into the lungs 2 (two) times daily., Disp: 1 Inhaler, Rfl: 12 .  Fluticasone Propionate, Inhal, (FLOVENT DISKUS) 100 MCG/BLIST AEPB, inhale 1 puff by mouth twice a day Rinse mouth after use, Disp: 60 each, Rfl: 5 .  gabapentin (NEURONTIN) 300 MG  capsule, TAKE 1 CAPSULE BY MOUTH AT BEDTIME FOR ONE WEEK  THEN TAKE ONE CAPSULE TWICE DAILY, Disp: 60 capsule, Rfl: 5 .  glipiZIDE (GLUCOTROL XL) 2.5 MG 24 hr tablet, TAKE 1 TABLET BY MOUTH ONCE EVERY MORNING BEFORE BREAKFAST., Disp: 30 tablet, Rfl: 11 .  INVOKANA 100 MG TABS tablet, TAKE 1 TABLET BY MOUTH ONCE DAILY BEFOREBREAKFAST, Disp: 30 tablet, Rfl: 11 .  lisinopril (PRINIVIL,ZESTRIL) 20 MG tablet, Take 1 tablet (20 mg total) by mouth daily., Disp: 90 tablet, Rfl: 4 .  montelukast (SINGULAIR) 10 MG tablet, TAKE 1 TABLET BY MOUTH ONCE EVERY EVENING AT BEDTIME., Disp: 30 tablet, Rfl: 11 .  naproxen (NAPROSYN) 500 MG tablet, Take 1 tablet (500 mg total) by  mouth 2 (two) times daily with a meal., Disp: 30 tablet, Rfl: 2 .  nitroGLYCERIN (NITROSTAT) 0.4 MG SL tablet, Place 1 tablet (0.4 mg total) under the tongue every 5 (five) minutes as needed for chest pain., Disp: 50 tablet, Rfl: 3 .  omeprazole (PRILOSEC) 20 MG capsule, TAKE 1 CAPSULE BY MOUTH ONCE DAILY, Disp: 30 capsule, Rfl: 12 .  ONE TOUCH ULTRA TEST test strip, TEST ONCE DAILY, Disp: 100 each, Rfl: 4 .  OneTouch Delica Lancets 80D MISC, TEST ONCE A DAY., Disp: 100 each, Rfl: 3 .  ONETOUCH DELICA LANCETS FINE MISC, TEST once daily, Disp: 100 each, Rfl: 3 .  PROAIR HFA 108 (90 Base) MCG/ACT inhaler, INHALE 2 PUFFS BY MOUTH EVERY 6 HOURS IF NEEDED FOR COUGH OR WHEEZING., Disp: 8.5 g, Rfl: 3 .  simvastatin (ZOCOR) 40 MG tablet, TAKE 1 TABLET BY MOUTH ONCE DAILY, Disp: 30 tablet, Rfl: 11 .  tiZANidine (ZANAFLEX) 4 MG capsule, Take 4 mg by mouth 3 (three) times daily as needed for muscle spasms., Disp: , Rfl:  .  triamcinolone cream (KENALOG) 0.5 %, apply to affected area twice a day, Disp: 30 g, Rfl: 5 .  Vitamin D, Ergocalciferol, (DRISDOL) 50000 units CAPS capsule, TAKE 1 CAPSULE BY MOUTH EVERY WEEK., Disp: 4 capsule, Rfl: 12 .  PARoxetine (PAXIL) 20 MG tablet, Take 1 tablet (20 mg total) by mouth daily. (Patient not taking: Reported on 03/31/2018), Disp: 30 tablet, Rfl: 1  Review of Systems  Constitutional: Negative for activity change, appetite change, chills, diaphoresis, fatigue, fever and unexpected weight change.  Respiratory: Negative for cough, shortness of breath and wheezing.   Cardiovascular: Negative for chest pain, palpitations and leg swelling.  Endocrine: Negative for cold intolerance, heat intolerance, polydipsia, polyphagia and polyuria.  Musculoskeletal: Positive for arthralgias and myalgias. Negative for back pain.  Skin: Negative for color change, pallor, rash and wound.  Allergic/Immunologic: Positive for environmental allergies.  Neurological: Negative for dizziness,  light-headedness and headaches.  Psychiatric/Behavioral: Negative for self-injury, sleep disturbance and suicidal ideas. The patient is not nervous/anxious.     Social History   Tobacco Use  . Smoking status: Never Smoker  . Smokeless tobacco: Never Used  Substance Use Topics  . Alcohol use: No    Alcohol/week: 0.0 standard drinks      Objective:   BP 122/74 (BP Location: Right Arm, Patient Position: Sitting, Cuff Size: Normal)   Pulse 60   Temp 97.7 F (36.5 C)   Resp 16   Wt 184 lb (83.5 kg)   SpO2 98%   BMI 31.58 kg/m  Vitals:   08/04/18 1340  BP: 122/74  Pulse: 60  Resp: 16  Temp: 97.7 F (36.5 C)  SpO2: 98%  Weight: 184 lb (83.5 kg)  Physical Exam  General Appearance:    Alert, cooperative, no distress  Eyes:    PERRL, conjunctiva/corneas clear, EOM's intact       Lungs:     Clear to auscultation bilaterally, respirations unlabored  Heart:    Regular rate and rhythm  Abdomen:   bowel sounds present and normal in all 4 quadrants, soft, round or obese, mild RUQ tenderness. No CVA tenderness    Results for orders placed or performed in visit on 08/04/18  POCT glycosylated hemoglobin (Hb A1C)  Result Value Ref Range   Hemoglobin A1C 7.8 (A) 4.0 - 5.6 %   HbA1c POC (<> result, manual entry)     HbA1c, POC (prediabetic range)     HbA1c, POC (controlled diabetic range)     Est. average glucose Bld gHb Est-mCnc 177        Assessment & Plan    1. Type 2 diabetes mellitus with diabetic neuropathy, without long-term current use of insulin (HCC) a1c is rising, but he has had some hypoglycemia. Will d/c glipizide and increase Invokana to 375m daily - POCT glycosylated hemoglobin (Hb A1C) - Lipid panel  2. Right upper quadrant abdominal pain Likely due to fatty liver or gallsones.  - [place]dom[place] Limited RUQ; Future - Comprehensive metabolic panel  3. Essential hypertension Well controlled.  Continue current medications.   - Lipid panel  4. NAFLD  (nonalcoholic fatty liver disease)  - Comprehensive metabolic panel     The entirety of the information documented in the History of Present Illness, Review of Systems and Physical Exam were personally obtained by me. Portions of this information were initially documented by RWilburt Finlay CMA and reviewed by me for thoroughness and accuracy.   DLelon Huh MD  [facility]

## 2018-08-05 ENCOUNTER — Telehealth: Payer: Self-pay

## 2018-08-05 LAB — LIPID PANEL
Chol/HDL Ratio: 3.9 ratio (ref 0.0–5.0)
Cholesterol, Total: 141 mg/dL (ref 100–199)
HDL: 36 mg/dL — ABNORMAL LOW (ref >39)
LDL Calculated: 44 mg/dL (ref 0–99)
Triglycerides: 305 mg/dL — ABNORMAL HIGH (ref 0–149)
VLDL Cholesterol Cal: 61 mg/dL — ABNORMAL HIGH (ref 5–40)

## 2018-08-05 LAB — COMPREHENSIVE METABOLIC PANEL
ALT: 28 IU/L (ref 0–44)
AST: 22 IU/L (ref 0–40)
Albumin/Globulin Ratio: 1.8 (ref 1.2–2.2)
Albumin: 4.3 g/dL (ref 3.8–4.9)
Alkaline Phosphatase: 152 IU/L — ABNORMAL HIGH (ref 39–117)
BUN/Creatinine Ratio: 12 (ref 9–20)
BUN: 14 mg/dL (ref 6–24)
Bilirubin Total: 0.4 mg/dL (ref 0.0–1.2)
CO2: 20 mmol/L (ref 20–29)
Calcium: 9 mg/dL (ref 8.7–10.2)
Chloride: 104 mmol/L (ref 96–106)
Creatinine, Ser: 1.17 mg/dL (ref 0.76–1.27)
GFR calc Af Amer: 81 mL/min/{1.73_m2} (ref >59)
GFR calc non Af Amer: 70 mL/min/{1.73_m2} (ref >59)
Globulin, Total: 2.4 g/dL (ref 1.5–4.5)
Glucose: 185 mg/dL — ABNORMAL HIGH (ref 65–99)
Potassium: 4.3 mmol/L (ref 3.5–5.2)
Sodium: 138 mmol/L (ref 134–144)
Total Protein: 6.7 g/dL (ref 6.0–8.5)

## 2018-08-05 NOTE — Telephone Encounter (Signed)
Advised patient of results.

## 2018-08-05 NOTE — Telephone Encounter (Signed)
-----  Message from Birdie Sons, MD sent at 08/05/2018  8:13 AM EDT ----- Liver functions are better, cholesterol is well controlled at 141. Continue current medications.  Awaiting results of ultrasound. He has CPE scheduled 08-2018, but he should postpone it for 3 months and will check his a1c then.

## 2018-08-05 NOTE — Telephone Encounter (Signed)
LMTCB 08/05/2018  Thanks,   -Mickel Baas

## 2018-08-14 ENCOUNTER — Ambulatory Visit: Admission: RE | Admit: 2018-08-14 | Payer: Medicare Other | Source: Ambulatory Visit

## 2018-08-19 ENCOUNTER — Other Ambulatory Visit: Payer: Self-pay

## 2018-08-19 ENCOUNTER — Encounter: Payer: Self-pay | Admitting: Family Medicine

## 2018-08-19 ENCOUNTER — Ambulatory Visit (INDEPENDENT_AMBULATORY_CARE_PROVIDER_SITE_OTHER): Payer: Medicare Other

## 2018-08-19 DIAGNOSIS — Z0000 Encounter for general adult medical examination without abnormal findings: Secondary | ICD-10-CM

## 2018-08-19 NOTE — Patient Instructions (Signed)
[name] , Thank you for taking time to come for your Medicare Wellness Visit. I appreciate your ongoing commitment to your health goals. Please review the following plan we discussed and let me know if I can assist you in the future.   Screening recommendations/referrals: Colonoscopy: Cologuard due 05/30/19 Recommended yearly ophthalmology/optometry visit for glaucoma screening and checkup Recommended yearly dental visit for hygiene and checkup  Vaccinations: Influenza vaccine: Up to date Pneumococcal vaccine: Not required until age 65 Tdap vaccine: Pt declines today.  Shingles vaccine: Not required until age 34.     Advanced directives: Advance directive discussed with you today. Even though you declined this today please call our office should you change your mind and we can give you the proper paperwork for you to fill out.  Conditions/risks identified: Continue to increase water in take to 6-8 8 oz glasses a day as tolerated.   Next appointment: 11/04/18 with Dr Caryn Section. Declined scheduling an AWV for 2021 at this time.   Preventive Care 40-64 Years, Male Preventive care refers to lifestyle choices and visits with your health care provider that can promote health and wellness. What does preventive care include?  A yearly physical exam. This is also called an annual well check.  Dental exams once or twice a year.  Routine eye exams. Ask your health care provider how often you should have your eyes checked.  Personal lifestyle choices, including:  Daily care of your teeth and gums.  Regular physical activity.  Eating a healthy diet.  Avoiding tobacco and drug use.  Limiting alcohol use.  Practicing safe sex.  Taking low-dose aspirin every day starting at age 95. What happens during an annual well check? The services and screenings done by your health care provider during your annual well check will depend on your age, overall health, lifestyle risk factors, and  family history of disease. Counseling  Your health care provider may ask you questions about your:  Alcohol use.  Tobacco use.  Drug use.  Emotional well-being.  Home and relationship well-being.  Sexual activity.  Eating habits.  Work and work Statistician. Screening  You may have the following tests or measurements:  Height, weight, and BMI.  Blood pressure.  Lipid and cholesterol levels. These may be checked every 5 years, or more frequently if you are over 34 years old.  Skin check.  Lung cancer screening. You may have this screening every year starting at age 80 if you have a 30-pack-year history of smoking and currently smoke or have quit within the past 15 years.  Fecal occult blood test (FOBT) of the stool. You may have this test every year starting at age 63.  Flexible sigmoidoscopy or colonoscopy. You may have a sigmoidoscopy every 5 years or a colonoscopy every 10 years starting at age 97.  Prostate cancer screening. Recommendations will vary depending on your family history and other risks.  Hepatitis C blood test.  Hepatitis B blood test.  Sexually transmitted disease (STD) testing.  Diabetes screening. This is done by checking your blood sugar (glucose) after you have not eaten for a while (fasting). You may have this done every 1-3 years. Discuss your test results, treatment options, and if necessary, the need for more tests with your health care provider. Vaccines  Your health care provider may recommend certain vaccines, such as:  Influenza vaccine. This is recommended every year.  Tetanus, diphtheria, and acellular pertussis (Tdap, Td) vaccine. You may need a Td booster every 10 years.  Zoster vaccine. You may need this after age 10.  Pneumococcal 13-valent conjugate (PCV13) vaccine. You may need this if you have certain conditions and have not been vaccinated.  Pneumococcal polysaccharide (PPSV23) vaccine. You may need one or two doses if you  smoke cigarettes or if you have certain conditions. Talk to your health care provider about which screenings and vaccines you need and how often you need them. This information is not intended to replace advice given to you by your health care provider. Make sure you discuss any questions you have with your health care provider. Document Released: 04/01/2015 Document Revised: 11/23/2015 Document Reviewed: 01/04/2015 Elsevier Interactive Patient Education  2017 [employer] Prevention in the Home Falls can cause injuries. They can happen to people of all ages. There are many things you can do to make your home safe and to help prevent falls. What can I do on the outside of my home?  Regularly fix the edges of walkways and driveways and fix any cracks.  Remove anything that might make you trip as you walk through a door, such as a raised step or threshold.  Trim any bushes or trees on the path to your home.  Use bright outdoor lighting.  Clear any walking paths of anything that might make someone trip, such as rocks or tools.  Regularly check to see if handrails are loose or broken. Make sure that both sides of any steps have handrails.  Any raised decks and porches should have guardrails on the edges.  Have any leaves, snow, or ice cleared regularly.  Use sand or salt on walking paths during winter.  Clean up any spills in your garage right away. This includes oil or grease spills. What can I do in the bathroom?  Use night lights.  Install grab bars by the toilet and in the tub and shower. Do not use towel bars as grab bars.  Use non-skid mats or decals in the tub or shower.  If you need to sit down in the shower, use a plastic, non-slip stool.  Keep the floor dry. Clean up any water that spills on the floor as soon as it happens.  Remove soap buildup in the tub or shower regularly.  Attach bath mats securely with double-sided non-slip rug tape.  Do not have throw  rugs and other things on the floor that can make you trip. What can I do in the bedroom?  Use night lights.  Make sure that you have a light by your bed that is easy to reach.  Do not use any sheets or blankets that are too big for your bed. They should not hang down onto the floor.  Have a firm chair that has side arms. You can use this for support while you get dressed.  Do not have throw rugs and other things on the floor that can make you trip. What can I do in the kitchen?  Clean up any spills right away.  Avoid walking on wet floors.  Keep items that you use a lot in easy-to-reach places.  If you need to reach something above you, use a strong step stool that has a grab bar.  Keep electrical cords out of the way.  Do not use floor polish or wax that makes floors slippery. If you must use wax, use non-skid floor wax.  Do not have throw rugs and other things on the floor that can make you trip. What can I do with  my stairs?  Do not leave any items on the stairs.  Make sure that there are handrails on both sides of the stairs and use them. Fix handrails that are broken or loose. Make sure that handrails are as long as the stairways.  Check any carpeting to make sure that it is firmly attached to the stairs. Fix any carpet that is loose or worn.  Avoid having throw rugs at the top or bottom of the stairs. If you do have throw rugs, attach them to the floor with carpet tape.  Make sure that you have a light switch at the top of the stairs and the bottom of the stairs. If you do not have them, ask someone to add them for you. What else can I do to help prevent falls?  Wear shoes that:  Do not have high heels.  Have rubber bottoms.  Are comfortable and fit you well.  Are closed at the toe. Do not wear sandals.  If you use a stepladder:  Make sure that it is fully opened. Do not climb a closed stepladder.  Make sure that both sides of the stepladder are locked into  place.  Ask someone to hold it for you, if possible.  Clearly mark and make sure that you can see:  Any grab bars or handrails.  First and last steps.  Where the edge of each step is.  Use tools that help you move around (mobility aids) if they are needed. These include:  Canes.  Walkers.  Scooters.  Crutches.  Turn on the lights when you go into a dark area. Replace any light bulbs as soon as they burn out.  Set up your furniture so you have a clear path. Avoid moving your furniture around.  If any of your floors are uneven, fix them.  If there are any pets around you, be aware of where they are.  Review your medicines with your doctor. Some medicines can make you feel dizzy. This can increase your chance of falling. Ask your doctor what other things that you can do to help prevent falls. This information is not intended to replace advice given to you by your health care provider. Make sure you discuss any questions you have with your health care provider. Document Released: 12/30/2008 Document Revised: 08/11/2015 Document Reviewed: 04/09/2014 Elsevier Interactive Patient Education  2017 [employer].

## 2018-08-19 NOTE — Progress Notes (Signed)
Subjective:   [name]. is a 58 y.o. male who presents for Medicare Annual/Subsequent preventive examination.    This visit is being conducted through telemedicine due to the COVID-19 pandemic. This patient has given me verbal consent via doximity to conduct this visit, patient states they are participating from their home address. Some vital signs may be absent or patient reported.    Patient identification: identified by name, DOB, and current address  Review of Systems:  N/A  Cardiac Risk Factors include: advanced age (>58mn, >>57women);diabetes mellitus;dyslipidemia;hypertension;male gender;obesity (BMI >30kg/m2)     Objective:    Vitals: There were no vitals taken for this visit.  There is no height or weight on file to calculate BMI.  Advanced Directives 08/19/2018 05/22/2017 12/27/2016 05/15/2016 11/27/2015  Does Patient Have a Medical Advance Directive? _0   Would patient like information on creating a medical advance directive? No - Patient declined No - Patient declined - No - Patient declined -    Tobacco Social History   Tobacco Use  Smoking Status Never Smoker  Smokeless Tobacco Never Used     Counseling given: Not Answered   Clinical Intake:  Pre-visit preparation completed: Yes  Pain : No/denies pain Pain Score: 5  Pain Type: Chronic pain Pain Location: Back Pain Orientation: Right, Left Pain Descriptors / Indicators: Aching, Radiating Pain Frequency: Constant     Nutritional Status: BMI > 30  Obese Nutritional Risks: None Diabetes: Yes  How often do you need to have someone help you when you read instructions, pamphlets, or other written materials from your doctor or pharmacy?: 1 - Never   Diabetes:  Is the patient diabetic?  Yes type 2 If diabetic, was a CBG obtained today?  No  Did the patient bring in their glucometer from home?  No  How often do you monitor your CBG's? Once daily unless pt has abnormal s/s.    Financial Strains and Diabetes Management:  Are you having any financial strains with the device, your supplies or your medication? No .  Does the patient want to be seen by Chronic Care Management for management of their diabetes?  No  Would the patient like to be referred to a Nutritionist or for Diabetic Management?  No   Diabetic Exams:  Diabetic Eye Exam: Completed 07/01/17. Overdue for diabetic eye exam. Pt has been advised about the importance in completing this exam. Pt plans to set up an eye exam this year.   Diabetic Foot Exam: Completed 04/26/16. Pt has been advised about the importance in completing this exam.  Note made to f/u on this at next in office visit.    Interpreter Needed?: No  Information [facility] :: [facility]  Past Medical History:  Diagnosis Date  . Arrhythmia   . Asthma   . Cyst of kidney, acquired   . [place] [address]  type 2  . GERD (gastroesophageal reflux disease)   . History of chicken pox   . History of measles as a child   . History of PKU   . Hyperlipidemia   . Hypertension   . IBS (irritable bowel syndrome)   . Irregular heart beat   . Mentally challenged   . Pancreatitis    Past Surgical History:  Procedure Laterality Date  . [facility]     [facility]  . COLONOSCOPY    . ESOPHAGOGASTRODUODENOSCOPY (EGD)  WITH PROPOFOL N/A 12/27/2016   Procedure: ESOPHAGOGASTRODUODENOSCOPY (EGD) WITH PROPOFOL;  Surgeon: Lin Landsman, MD;  Location[facility]COPY;  Service: Gastroenterology;  Laterality: N/A;  . HEMORRHOID SURGERY    . Ligament Removal Left    of left thumb: dr. Cleda Mccreedy  . ligament removal  of left thumb     Dr. Cleda Mccreedy  . NM GATED MYOCARDIAL STUDY (ARMX HX)  06/23/2014   Paraschos. Normal   Family History  Problem Relation Age of Onset  . Cancer Mother        throat  . Diabetes Father   . Heart disease Father   . Cancer Maternal Grandmother    . Cancer Maternal Grandfather        pancreatic  . Cancer Paternal Grandfather    Social History   Socioeconomic History  . Marital status: Married    Spouse name: Not on file  . Number of children: 0  . Years of education: Not on file  . Highest education level: 12th grade  Occupational History  . Occupation: Disabled  Social Needs  . Financial resource strain: Not hard at all  . Food insecurity:    Worry: Never true    Inability: Never true  . Transportation needs:    Medical: No    Non-medical: No  Tobacco Use  . Smoking status: Never Smoker  . Smokeless tobacco: Never Used  Substance and Sexual Activity  . Alcohol use: No    Alcohol/week: 0.0 standard drinks  . Drug use: No  . Sexual activity: Not on file  Lifestyle  . Physical activity:    Days per week: 0 days    Minutes per session: 0 min  . Stress: Only a little  Relationships  . Social connections:    Talks on phone: Patient refused    Gets together: Patient refused    Attends religious service: Patient refused    Active member of club or organization: Patient refused    Attends meetings of clubs or organizations: Patient refused    Relationship status: Patient refused  Other Topics Concern  . Not on file  Social History Narrative   ** Merged History Encounter **       ** Data from: 10/22/14 Enc Dept: [facility]       ** Data from: 10/07/14 Enc Dept: [facility]   Arrest: Arrested in 2012 for indecent liberties, required to wear ankle monitor    Outpatient Encounter Medications as of 08/19/2018  Medication Sig  . canagliflozin (INVOKANA) 300 MG TABS tablet Take 1 tablet (300 mg total) by mouth daily before breakfast.  . Cholecalciferol (VITAMIN D3) 50000 units CAPS Take 50,000 Units by mouth every 30 (thirty) days. (Patient taking differently: Take 50,000 Units by mouth once a week. )  . EPINEPHrine 0.3 mg/0.3 mL IJ SOAJ injection Inject 0.3 mLs (0.3 mg total) into the muscle once  as needed for up to 1 dose.  . fluticasone (FLOVENT HFA) 110 MCG/ACT inhaler Inhale 1 puff into the lungs 2 (two) times daily.  . Fluticasone Propionate, Inhal, (FLOVENT DISKUS) 100 MCG/BLIST AEPB inhale 1 puff by mouth twice a day Rinse mouth after use  . gabapentin (NEURONTIN) 300 MG capsule TAKE 1 CAPSULE BY MOUTH AT BEDTIME FOR ONE WEEK  THEN TAKE ONE CAPSULE TWICE DAILY  . lisinopril (PRINIVIL,ZESTRIL) 20 MG tablet Take 1 tablet (20 mg total) by mouth daily.  . montelukast (SINGULAIR) 10 MG tablet TAKE 1 TABLET BY MOUTH ONCE EVERY EVENING  AT BEDTIME.  . naproxen (NAPROSYN) 500 MG tablet Take 1 tablet (500 mg total) by mouth 2 (two) times daily with a meal.  . nitroGLYCERIN (NITROSTAT) 0.4 MG SL tablet Place 1 tablet (0.4 mg total) under the tongue every 5 (five) minutes as needed for chest pain.  [name] omeprazole (PRILOSEC) 20 MG capsule TAKE 1 CAPSULE BY MOUTH ONCE DAILY  . ONE TOUCH ULTRA TEST test strip TEST ONCE DAILY  . OneTouch Delica Lancets 79X MISC TEST ONCE A DAY.  Glory Rosebush DELICA LANCETS FINE MISC TEST once daily  . PROAIR HFA 108 (90 Base) MCG/ACT inhaler INHALE 2 PUFFS BY MOUTH EVERY 6 HOURS IF NEEDED FOR COUGH OR WHEEZING.  . simvastatin (ZOCOR) 40 MG tablet TAKE 1 TABLET BY MOUTH ONCE DAILY  . tiZANidine (ZANAFLEX) 4 MG capsule Take 4 mg by mouth 3 (three) times daily as needed for muscle spasms.  [name] triamcinolone cream (KENALOG) 0.5 % apply to affected area twice a day  . Vitamin D, Ergocalciferol, (DRISDOL) 50000 units CAPS capsule TAKE 1 CAPSULE BY MOUTH EVERY WEEK.  [name] PARoxetine (PAXIL) 20 MG tablet Take 1 tablet (20 mg total) by mouth daily. (Patient not taking: Reported on 08/19/2018)   No facility-administered encounter medications on file as of 08/19/2018.     Activities of Daily Living In your present state of health, do you have any difficulty performing the following activities: 08/19/2018  Hearing? N  Vision? Y  Comment Due to cataracts on both eyes.   Difficulty  concentrating or making decisions? Y  Walking or climbing stairs? Y  Comment Due to leg weakness.   Dressing or bathing? N  Doing errands, shopping? N  Preparing Food and eating ? N  Using the Toilet? N  In the past six months, have you accidently leaked urine? N  Do you have problems with loss of bowel control? N  Managing your Medications? N  Managing your Finances? N  Housekeeping or managing your Housekeeping? N  Some recent data might be hidden    Patient Care Team: Birdie Sons, MD as PCP - General (Family Medicine) Anell Barr, [place] (Optometry) Dionisio David, MD as Consulting Physician (Cardiology)   Assessment:   This is a routine wellness examination for [place].  Exercise Activities and Dietary recommendations Current Exercise Habits: The patient does not participate in regular exercise at present, Exercise limited by: orthopedic condition(s)  Goals    . DIET - INCREASE WATER INTAKE     Recommend increasing water intake to 4 glasses a day.     . Increase water intake     Starting 05/15/16, I will start drinking 2-3 glasses of water a day.       Fall Risk Fall Risk  08/19/2018 05/22/2017 05/15/2016  Falls in the past year? 0 Yes Yes  Number falls in past yr: - 2 or more 2 or more  Injury with Fall? - No No  Follow up - Falls prevention discussed Falls prevention discussed   FALL RISK PREVENTION PERTAINING TO THE HOME:  Any stairs in or around the home? Yes  If so, are there any without handrails? Yes   Home free of loose throw rugs in walkways, pet beds, electrical cords, etc? Yes  Adequate lighting in your home to reduce risk of falls? Yes   ASSISTIVE DEVICES UTILIZED TO PREVENT FALLS:  Life alert? No  Use of a cane, walker or w/c? No  Grab bars in the bathroom? No  Shower chair  or bench in shower? No  Elevated toilet seat or a handicapped toilet? No    TIMED UP AND GO:  Was the test performed? No .    Depression Screen PHQ 2/9 Scores  08/19/2018 08/04/2018 05/22/2017 05/22/2017  PHQ - 2 Score 0 _0 PHQ- 9 Score - 3 5 -    Cognitive Function: Declined today.     6CIT Screen 05/15/2016  What Year? 0 points  What month? 3 points  What time? 3 points  Count back from 20 0 points  Months in reverse 0 points  Repeat phrase 10 points  Total Score 16    Immunization History  Administered Date(s) Administered  . Hepatitis A, Adult 09/26/2016, 05/22/2017  . Hepatitis B, adult 09/26/2016, 05/22/2017, 11/29/2017  . Influenza,inj,Quad PF,6+ Mos 12/22/2012, 12/21/2013, 11/29/2017  . Influenza-Unspecified 11/30/2014, 11/18/2015  . Pneumococcal Polysaccharide-23 12/22/2012, 11/18/2015    Qualifies for Shingles Vaccine? No    Tdap: Although this vaccine is not a covered service during a Wellness Exam, does the patient still wish to receive this vaccine today?  No . Advised may receive this vaccine at local pharmacy or Health Dept. Aware to provide a copy of the vaccination record if obtained from local pharmacy or Health Dept. Verbalized acceptance and understanding.  Flu Vaccine: Up to date   Screening Tests Health Maintenance  Topic Date Due  . TETANUS/TDAP  02/09/1982  . FOOT EXAM  04/26/2017  . OPHTHALMOLOGY EXAM  07/02/2018  . INFLUENZA VACCINE  10/18/2018  . HEMOGLOBIN A1C  02/04/2019  . Fecal DNA (Cologuard)  05/30/2019  . PNEUMOCOCCAL POLYSACCHARIDE VACCINE AGE 77-64 HIGH RISK  Completed  . Hepatitis C Screening  Completed  . HIV Screening  Completed   Cancer Screenings:  Colorectal Screening: Cologuard completed 05/29/16. Repeat every 3 years.  Lung Cancer Screening: (Low Dose CT Chest recommended if Age 75-80 years, 30 pack-year currently smoking OR have quit w/in 15years.) does not qualify.   Additional Screening:  Hepatitis C Screening: Up to date  Dental Screening: Recommended annual dental exams for proper oral hygiene  Community Resource Referral:  CRR required this visit?  No        Plan:   I have personally reviewed and addressed the Medicare Annual Wellness questionnaire and have noted the following in the patient's chart:  A. Medical and social history B. Use of alcohol, tobacco or illicit drugs  C. Current medications and supplements D. Functional ability and status E.  Nutritional status F.  Physical activity G. Advance directives H. List of other physicians I.  Hospitalizations, surgeries, and ER visits in previous 12 months J.  [place] such as hearing and vision if needed, cognitive and depression L. Referrals and appointments   In addition, I have reviewed and discussed with patient certain preventive protocols, quality metrics, and best practice recommendations. A written personalized care plan for preventive services as well as general preventive health recommendations were provided to patient.   [place]ora Score, [place]  0/03/270 Nurse Health Advisor  Nurse Notes: Pt needs a diabetic foot exam at next OV. Pt plans to set up an eye exam this year. Declined a future order for a tetanus vaccine today.

## 2018-08-19 NOTE — Patient Outreach (Signed)
[facility]) Care Management  08/19/2018  [name] April 11, 1960 [id number]   Medication Adherence call to [name] HIPPA Compliant Voice message left with a call back number. Patient is showing past due on Lisinopril 20 mg and Simvastatin 40 mg under [facility].   [place] Management Direct Dial [phone]  Fax [phone] [email or web address]

## 2018-08-20 ENCOUNTER — Other Ambulatory Visit: Payer: Self-pay | Admitting: Family Medicine

## 2018-08-27 ENCOUNTER — Other Ambulatory Visit: Payer: Self-pay

## 2018-08-27 ENCOUNTER — Ambulatory Visit: Payer: Medicare Other

## 2018-08-27 ENCOUNTER — Encounter: Payer: Self-pay | Admitting: Nurse Practitioner

## 2018-08-27 ENCOUNTER — Ambulatory Visit (INDEPENDENT_AMBULATORY_CARE_PROVIDER_SITE_OTHER): Payer: Medicare Other | Admitting: Nurse Practitioner

## 2018-08-27 VITALS — BP 114/72 | HR 62 | Temp 97.8°F

## 2018-08-27 DIAGNOSIS — E701 Other hyperphenylalaninemias: Secondary | ICD-10-CM

## 2018-08-27 DIAGNOSIS — E119 Type 2 diabetes mellitus without complications: Secondary | ICD-10-CM

## 2018-08-27 DIAGNOSIS — M25562 Pain in left knee: Secondary | ICD-10-CM

## 2018-08-27 DIAGNOSIS — E1169 Type 2 diabetes mellitus with other specified complication: Secondary | ICD-10-CM | POA: Diagnosis not present

## 2018-08-27 DIAGNOSIS — I10 Essential (primary) hypertension: Secondary | ICD-10-CM | POA: Diagnosis not present

## 2018-08-27 DIAGNOSIS — E785 Hyperlipidemia, unspecified: Secondary | ICD-10-CM

## 2018-08-27 DIAGNOSIS — E700 Classical phenylketonuria: Secondary | ICD-10-CM | POA: Diagnosis not present

## 2018-08-27 DIAGNOSIS — R1031 Right lower quadrant pain: Secondary | ICD-10-CM

## 2018-08-27 DIAGNOSIS — M25561 Pain in right knee: Secondary | ICD-10-CM

## 2018-08-27 DIAGNOSIS — G8929 Other chronic pain: Secondary | ICD-10-CM

## 2018-08-27 DIAGNOSIS — E559 Vitamin D deficiency, unspecified: Secondary | ICD-10-CM

## 2018-08-27 MED ORDER — VITAMIN D3 1.25 MG (50000 UT) PO CAPS: 50000.0000 [IU] | ORAL_CAPSULE | ORAL | 5 refills | Status: DC

## 2018-08-27 NOTE — Progress Notes (Signed)
New Patient Office Visit  Subjective:  Patient ID: [name]., male    DOB: [birth year]  Age: 58 y.o. MRN: [id number]  CC:  Chief Complaint  Patient presents with   Establish Care    HPI [name]. presents for new patient visit to establish care.  Introduced to Designer, jewellery role and practice setting.  All questions answered.  Previously received care at [facility], but is switching practices due to being closer to this location.  Has multiple health issues on review.  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  Noted to be a poor historian and very hyper focused on health issues during assessment today.  Does require frequent repetition of findings and instruction.  Discussed CCM team with him and will place referral for this to assist in ongoing repetition of education and ensure he is taking medications as instructed.    DIABETES Has been diabetic for 6-7 years per his report.  Is taking Invokana.  Last A1C was 7.6% in January.  States he stopped taking Metformin over a year ago because the kidney doctor told him to stop, but no notes noted on this.  Glipizide recently discontinued by previous PCP due to hypoglycemia and Invokana increase on 08/04/2018.   Hypoglycemic episodes:yes, has a history of one of 52 two weeks ago Polydipsia/polyuria: no Visual disturbance: no Chest pain: no Paresthesias: no Glucose Monitoring: yes  Accucheck frequency: Daily  Fasting glucose: 200's  Post prandial:  Evening:  Before meals: Taking Insulin?: no  Long acting insulin:  Short acting insulin: Blood Pressure Monitoring: not checking Retinal Examination: Not up to Date Foot Exam: Not up to Date Pneumovax: Up to Date Influenza: Up to Date Aspirin: no   HYPERTENSION / HYPERLIPIDEMIA Continues on Lisinopril and Simvastatin.  States he occasionally has SOB when out mowing lawn or doing more  tedious tasks, does not have this all the time.  He has Proair inhaler he reports his previous provider prescribed, but he does not use it.  State he "really would like nebulizers because my friends have this work well".  Also reports he has NTG, but has not taken it recently.  Reports NTG is for his occasional CP "that I get".  On review he has been followed by Dr. Abigail Rogers for left renal cyst.  Was referred to cardiology by previous provider, but on review of notes he did not attend due to copay. Satisfied with current treatment? yes Duration of hypertension: chronic BP monitoring frequency: not checking BP range: none BP medication side effects: no Duration of hyperlipidemia: chronic Cholesterol medication side effects: no Cholesterol supplements: none Medication compliance: good compliance Aspirin: no Recent stressors: no Recurrent headaches: no Visual changes: no Palpitations: no Dyspnea: no intermittently, none today Chest pain: no not recently Lower extremity edema: no Dizzy/lightheaded: no   KNEE PAIN He reports his previous provider told him he has mass on his left knee.  Last imaging was 2017 and he did have degenerative changes.  He reports he is going to see ortho next week for follow-up with his OA. Duration: chronic Involved knee: bilateral Mechanism of injury: unknown Location:anterior Onset: gradual Severity: 5/10  Quality:  dull and aching Frequency: intermittent Radiation: no Aggravating factors: running, bending and movement  Alleviating factors: NSAIDs  Status: better Treatments attempted: APAP  Relief with NSAIDs?:  mild Weakness with weight bearing or walking: no Sensation of giving way: no Locking: no Popping:  no Bruising: no Swelling: no Redness: no Paresthesias/decreased sensation: no Fevers: no   ABDOMINAL PAIN  States this is ongoing issue.  Reports he has been having abdominal pain and Dr. Caryn Rogers, his previous provider, ordered abdominal u/s  but he wants [name] to order this because he is "with [name] now".  He has not gone for ultrasound yet and states pain is remaining same and not worsening.  Had negative cologuard in March 2018, patient reports he has history of "like 15 colonoscopies".  Saw Dr. Bary Rogers 04/23/17 for abdominal pain.  Previous scans have noted fatty liver.  On review of notes CT scan in February 2019 noted no new areas of concern and is to return to Dr. Bary Rogers as needed. Duration:months Onset: gradual Severity: 6/10 at worst and 2/10 at best Quality: dull, aching and throbbing Location:  LLQ and RLQ  Episode duration:  Radiation: no Frequency: intermittent Alleviating factors:  Aggravating factors: Status: fluctuating Treatments attempted: PPI Fever: no Nausea: no Vomiting: no Weight loss: no Decreased appetite: no Diarrhea: no Constipation: no Blood in stool: no Heartburn: no Jaundice: no Rash: no Dysuria/urinary frequency: no Hematuria: no History of sexually transmitted disease: no Recurrent NSAID use: no  Past Medical History:  Diagnosis Date   Arrhythmia    Asthma    Cyst of kidney, acquired    Diabetes mellitus without complication ([place]) 8657   type 2   GERD (gastroesophageal reflux disease)    History of chicken pox    History of measles as a child    History of PKU    Hyperlipidemia    Hypertension    IBS (irritable bowel syndrome)    Irregular heart beat    Mentally challenged    Pancreatitis     Past Surgical History:  Procedure Laterality Date   Cardiac Catherization     [facility]   CARDIAC CATHETERIZATION     [facility]   COLONOSCOPY     ESOPHAGOGASTRODUODENOSCOPY (EGD) WITH PROPOFOL N/A 12/27/2016   Procedure: ESOPHAGOGASTRODUODENOSCOPY (EGD) WITH PROPOFOL;  Surgeon: Lin Landsman, MD;  Location: [place];  Service: Gastroenterology;  Laterality: N/A;   HEMORRHOID SURGERY     Ligament Removal Left    of left thumb: dr. Cleda Rogers   ligament removal  of  left thumb     Dr. Cleda Rogers   NM GATED MYOCARDIAL STUDY (ARMX HX)  06/23/2014   Paraschos. Normal    Family History  Problem Relation Age of Onset   Cancer Mother        throat   Diabetes Father    Heart disease Father    Cancer Maternal Grandmother    Cancer Maternal Grandfather        pancreatic   Cancer Paternal Grandfather     Social History   Socioeconomic History   Marital status: Married    Spouse name: Not on file   Number of children: 0   Years of education: Not on file   Highest education level: 12th grade  Occupational History   Occupation: Disabled  Scientist, product/process development strain: Not hard at all   Food insecurity:    Worry: Never true    Inability: Never true   Transportation needs:    Medical: No    Non-medical: No  Tobacco Use   Smoking status: Never Smoker   Smokeless tobacco: Never Used  Substance and Sexual Activity   Alcohol use: No    Alcohol/week: 0.0 standard drinks   Drug use: No  Sexual activity: Not on file  Lifestyle   Physical activity:    Days per week: 0 days    Minutes per session: 0 min   Stress: Only a little  Relationships   Social connections:    Talks on phone: Patient refused    Gets together: Patient refused    Attends religious service: Patient refused    Active member of club or organization: Patient refused    Attends meetings of clubs or organizations: Patient refused    Relationship status: Patient refused   Intimate partner violence:    Fear of current or ex partner: Patient refused    Emotionally abused: Patient refused    Physically abused: Patient refused    Forced sexual activity: Patient refused  Other Topics Concern   Not on file  Social History Narrative   ** Merged History Encounter **       ** Data from: 10/22/14 Enc Dept: [facility]       ** Data from: 10/07/14 Enc Dept: [facility]   Arrest: Arrested in 2012 for indecent liberties, required to  wear ankle monitor    ROS Review of Systems  Constitutional: Negative for activity change, diaphoresis, fatigue and fever.  Respiratory: Negative for cough, chest tightness, shortness of breath (occasional, at baseline) and wheezing.   Cardiovascular: Negative for chest pain, palpitations and leg swelling.  Gastrointestinal: Positive for abdominal pain. Negative for abdominal distention, blood in stool, constipation, diarrhea, nausea and vomiting.  Endocrine: Negative for cold intolerance, heat intolerance, polydipsia, polyphagia and polyuria.  Musculoskeletal: Positive for arthralgias.  Skin: Negative.   Neurological: Negative for dizziness, syncope, weakness, light-headedness, numbness and headaches.  Psychiatric/Behavioral: Negative.     Objective:   Today's Vitals: BP 114/72    Pulse 62    Temp 97.8 F (36.6 C) (Oral)    SpO2 97%   Physical Exam Vitals signs and nursing note reviewed.  Constitutional:      General: He is awake. He is not in acute distress.    Appearance: He is well-developed. He is morbidly obese. He is not ill-appearing.  HENT:     Head: Normocephalic and atraumatic.     Right Ear: Hearing normal. No drainage.     Left Ear: Hearing normal. No drainage.     Mouth/Throat:     Pharynx: Uvula midline.  Eyes:     General: Lids are normal.        Right eye: No discharge.        Left eye: No discharge.     Conjunctiva/sclera: Conjunctivae normal.     Pupils: Pupils are equal, round, and reactive to light.  Neck:     Musculoskeletal: Normal range of motion and neck supple.     Thyroid: No thyromegaly.     Vascular: No carotid bruit or JVD.     Trachea: Trachea normal.  Cardiovascular:     Rate and Rhythm: Normal rate and regular rhythm.     Heart sounds: Normal heart sounds, S1 normal and S2 normal. No murmur. No gallop.   Pulmonary:     Effort: Pulmonary effort is normal. No accessory muscle usage or respiratory distress.     Breath sounds: Normal breath  sounds.  Abdominal:     General: Bowel sounds are normal.     Palpations: Abdomen is soft. There is no hepatomegaly or splenomegaly.     Tenderness: There is generalized abdominal tenderness. There is no right CVA tenderness, left CVA tenderness, guarding  or rebound. Negative signs include Murphy's sign.     Comments: Small out pouch at umbilicus, soft and non tender.  Rotund abdomen, soft.  Musculoskeletal: Normal range of motion.     Right knee: He exhibits normal range of motion, no swelling, no laceration and no erythema. No tenderness found.     Left knee: He exhibits normal range of motion, no swelling, no laceration and no erythema. No tenderness found.     Right lower leg: No edema.     Left lower leg: No edema.  Skin:    General: Skin is warm and dry.     Capillary Refill: Capillary refill takes less than 2 seconds.     Findings: No rash.  Neurological:     Mental Status: He is alert and oriented to person, place, and time.     Deep Tendon Reflexes: Reflexes are normal and symmetric.  Psychiatric:        Attention and Perception: Attention normal.        Mood and Affect: Mood normal.        Speech: Speech normal.        Behavior: Behavior normal. Behavior is cooperative.        Thought Content: Thought content normal.        Judgment: Judgment normal.     Assessment & Plan:   Problem List Items Addressed This Visit      Cardiovascular and Mediastinum   Hypertension    Chronic, ongoing.  BP below goal today.  Continue current medication regimen.  CMP today.      Relevant Orders   Comprehensive metabolic panel     Endocrine   Diabetes mellitus type 2, controlled ([place]) - Primary    Chronic, ongoing.  Obtain A1C today.  Continue current medication regimen and adjust as needed based on A1C. Consider reinitiating Glipizide if elevations noted.  Could consider Metformin, as recent kidney function has been stable, repeat CMP today.  Contact Dr. Juleen China to see if patient  attending appointments and if Metformin was discontinued.  Return in one week for f/u.       Relevant Orders   HgB A1c   Ambulatory referral to Chronic Care Management Services   Hyperlipidemia associated with type 2 diabetes mellitus (HCC)    Chronic, ongoing.  Continue current medication regimen.  Lipid panel and CMP ordered.      Relevant Orders   Lipid Panel w/o Chol/HDL Ratio     Other   Abdominal pain    Ongoing issue with dx of fatty liver.  Will re-enter abdominal u/s order.  Patient has been followed for this for some time.  Previously saw Dr. Bary Rogers.  If ongoing complaints will consider GI referral after reviewing results of ultrasound.  CMP today.      Relevant Orders   US Abdomen Limited RUQ   Comprehensive metabolic panel   Phenylketonuria (PKU) ([place])    As infant with subsequent mental delays.  CCM referral placed to assist in reiterating plan of care and ensure medication regimen correctly followed.        Vitamin D deficiency   Relevant Medications   Cholecalciferol (VITAMIN D3) 1.25 MG (50000 UT) CAPS   Knee pain    Ongoing issue, reports he has ortho visit upcoming.  Will await findings from this referral and monitor as patient poor historian.  If no visit and ongoing complaint of pain will consider repeat x-rays and referral to PT.  Outpatient Encounter Medications as of 08/27/2018  Medication Sig   canagliflozin (INVOKANA) 300 MG TABS tablet Take 1 tablet (300 mg total) by mouth daily before breakfast.   Cholecalciferol (VITAMIN D3) 1.25 MG (50000 UT) CAPS Take 50,000 Units by mouth once a week.   EPINEPHrine 0.3 mg/0.3 mL IJ SOAJ injection Inject 0.3 mLs (0.3 mg total) into the muscle once as needed for up to 1 dose.   fluticasone (FLOVENT HFA) 110 MCG/ACT inhaler Inhale 1 puff into the lungs 2 (two) times daily.   Fluticasone Propionate, Inhal, (FLOVENT DISKUS) 100 MCG/BLIST AEPB inhale 1 puff by mouth twice a day Rinse mouth after use    gabapentin (NEURONTIN) 300 MG capsule TAKE 1 CAPSULE BY MOUTH AT BEDTIME FOR ONE WEEK  THEN TAKE ONE CAPSULE TWICE DAILY   lisinopril (ZESTRIL) 20 MG tablet TAKE 1 TABLET BY MOUTH ONCE DAILY   montelukast (SINGULAIR) 10 MG tablet TAKE 1 TABLET BY MOUTH ONCE EVERY EVENING AT BEDTIME.   nitroGLYCERIN (NITROSTAT) 0.4 MG SL tablet Place 1 tablet (0.4 mg total) under the tongue every 5 (five) minutes as needed for chest pain.   omeprazole (PRILOSEC) 20 MG capsule TAKE 1 CAPSULE BY MOUTH ONCE DAILY   ONE TOUCH ULTRA TEST test strip TEST ONCE DAILY   OneTouch Delica Lancets 53Z MISC TEST ONCE A DAY.   ONETOUCH DELICA LANCETS FINE MISC TEST once daily   simvastatin (ZOCOR) 40 MG tablet TAKE 1 TABLET BY MOUTH ONCE DAILY   [DISCONTINUED] Cholecalciferol (VITAMIN D3) 50000 units CAPS Take 50,000 Units by mouth every 30 (thirty) days. (Patient taking differently: Take 50,000 Units by mouth once a week. )   [DISCONTINUED] Vitamin D, Ergocalciferol, (DRISDOL) 1.25 MG (50000 UT) CAPS capsule TAKE 1 CAPSULE BY MOUTH SAME DAY ONCE WEEKLY   PROAIR HFA 108 (90 Base) MCG/ACT inhaler INHALE 2 PUFFS BY MOUTH EVERY 6 HOURS IF NEEDED FOR COUGH OR WHEEZING. (Patient not taking: Reported on 08/27/2018)   tiZANidine (ZANAFLEX) 4 MG capsule Take 4 mg by mouth 3 (three) times daily as needed for muscle spasms.   triamcinolone cream (KENALOG) 0.5 % apply to affected area twice a day (Patient not taking: Reported on 08/27/2018)   [DISCONTINUED] naproxen (NAPROSYN) 500 MG tablet Take 1 tablet (500 mg total) by mouth 2 (two) times daily with a meal. (Patient not taking: Reported on 08/27/2018)   [DISCONTINUED] PARoxetine (PAXIL) 20 MG tablet Take 1 tablet (20 mg total) by mouth daily. (Patient not taking: Reported on 08/19/2018)   No facility-administered encounter medications on file as of 08/27/2018.     Follow-up: Return in about 1 week (around 09/03/2018) for Physical follow-up.   [name], NP

## 2018-08-27 NOTE — Assessment & Plan Note (Signed)
Chronic, ongoing.  Obtain A1C today.  Continue current medication regimen and adjust as needed based on A1C. Consider reinitiating Glipizide if elevations noted.  Could consider Metformin, as recent kidney function has been stable, repeat CMP today.  Contact Dr. Juleen China to see if patient attending appointments and if Metformin was discontinued.  Return in one week for f/u.

## 2018-08-27 NOTE — Assessment & Plan Note (Signed)
Chronic, ongoing.  Continue current medication regimen.  Lipid panel and CMP ordered.

## 2018-08-27 NOTE — Assessment & Plan Note (Signed)
Chronic, ongoing.  BP below goal today.  Continue current medication regimen.  CMP today.

## 2018-08-27 NOTE — Assessment & Plan Note (Signed)
Ongoing issue with dx of fatty liver.  Will re-enter abdominal u/s order.  Patient has been followed for this for some time.  Previously saw Dr. Bary Castilla.  If ongoing complaints will consider GI referral after reviewing results of ultrasound.  CMP today.

## 2018-08-27 NOTE — Patient Instructions (Addendum)
[place], [place], [place] [address] [phone]  To look at belly and see if hernia, light hernia  Abdominal Pain, Adult  Many things can cause belly (abdominal) pain. Most times, belly pain is not dangerous. Many cases of belly pain can be watched and treated at home. Sometimes belly pain is serious, though. Your doctor will try to find the cause of your belly pain. Follow these instructions at home:  Take over-the-counter and prescription medicines only as told by your doctor. Do not take medicines that help you poop (laxatives) unless told to by your doctor.  Drink enough fluid to keep your pee (urine) clear or pale yellow.  Watch your belly pain for any changes.  Keep all follow-up visits as told by your doctor. This is important. Contact a doctor if:  Your belly pain changes or gets worse.  You are not hungry, or you lose weight without trying.  You are having trouble pooping (constipated) or have watery poop (diarrhea) for more than 2-3 days.  You have pain when you pee or poop.  Your belly pain wakes you up at night.  Your pain gets worse with meals, after eating, or with certain foods.  You are throwing up and cannot keep anything down.  You have a fever. Get help right away if:  Your pain does not go away as soon as your doctor says it should.  You cannot stop throwing up.  Your pain is only in areas of your belly, such as the right side or the left lower part of the belly.  You have bloody or black poop, or poop that looks like tar.  You have very bad pain, cramping, or bloating in your belly.  You have signs of not having enough fluid or water in your body (dehydration), such as: ? Dark pee, very little pee, or no pee. ? Cracked lips. ? Dry mouth. ? Sunken eyes. ? Sleepiness. ? Weakness. This information is not intended to replace advice given to you by your health care provider. Make sure you discuss any questions you have with your  health care provider. Document Released: 08/22/2007 Document Revised: 09/23/2015 Document Reviewed: 08/17/2015 Elsevier Interactive Patient Education  2019 [employer].

## 2018-08-27 NOTE — Assessment & Plan Note (Signed)
Ongoing issue, reports he has ortho visit upcoming.  Will await findings from this referral and monitor as patient poor historian.  If no visit and ongoing complaint of pain will consider repeat x-rays and referral to PT.

## 2018-08-27 NOTE — Assessment & Plan Note (Signed)
As infant with subsequent mental delays.  CCM referral placed to assist in reiterating plan of care and ensure medication regimen correctly followed.

## 2018-08-28 LAB — LIPID PANEL W/O CHOL/HDL RATIO
Cholesterol, Total: 174 mg/dL (ref 100–199)
HDL: 34 mg/dL — ABNORMAL LOW (ref >39)
Triglycerides: 420 mg/dL — ABNORMAL HIGH (ref 0–149)

## 2018-08-28 LAB — COMPREHENSIVE METABOLIC PANEL
ALT: 40 IU/L (ref 0–44)
AST: 36 IU/L (ref 0–40)
Albumin/Globulin Ratio: 1.8 (ref 1.2–2.2)
Albumin: 4.6 g/dL (ref 3.8–4.9)
Alkaline Phosphatase: 155 IU/L — ABNORMAL HIGH (ref 39–117)
BUN/Creatinine Ratio: 11 (ref 9–20)
BUN: 12 mg/dL (ref 6–24)
Bilirubin Total: 0.5 mg/dL (ref 0.0–1.2)
CO2: 18 mmol/L — ABNORMAL LOW (ref 20–29)
Calcium: 9.2 mg/dL (ref 8.7–10.2)
Chloride: 107 mmol/L — ABNORMAL HIGH (ref 96–106)
Creatinine, Ser: 1.14 mg/dL (ref 0.76–1.27)
GFR calc Af Amer: 83 mL/min/{1.73_m2} (ref >59)
GFR calc non Af Amer: 72 mL/min/{1.73_m2} (ref >59)
Globulin, Total: 2.6 g/dL (ref 1.5–4.5)
Glucose: 168 mg/dL — ABNORMAL HIGH (ref 65–99)
Potassium: 4.1 mmol/L (ref 3.5–5.2)
Sodium: 142 mmol/L (ref 134–144)
Total Protein: 7.2 g/dL (ref 6.0–8.5)

## 2018-08-28 LAB — HEMOGLOBIN A1C
Est. average glucose Bld gHb Est-mCnc: 192 mg/dL
Hgb A1c MFr Bld: 8.3 % — ABNORMAL HIGH (ref 4.8–5.6)

## 2018-08-29 ENCOUNTER — Telehealth: Payer: Self-pay | Admitting: *Deleted

## 2018-08-29 NOTE — Telephone Encounter (Signed)
FYI! Patient called office requesting we cancel all his upcoming appointments scheduled with Dr. Caryn Section. Patient states he will no longer be seen at this practice. He is going to see a new provider.

## 2018-08-29 NOTE — Addendum Note (Signed)
Addended by: Marnee Guarneri T on: 08/29/2018 08:44 AM   Modules accepted: Orders

## 2018-09-01 ENCOUNTER — Ambulatory Visit
Admission: RE | Admit: 2018-09-01 | Discharge: 2018-09-01 | Disposition: A | Discharge status: Home / Self Care | Payer: Medicare Other | Source: Ambulatory Visit | Attending: Nurse Practitioner | Admitting: Nurse Practitioner

## 2018-09-01 ENCOUNTER — Other Ambulatory Visit: Payer: Self-pay

## 2018-09-01 DIAGNOSIS — R1031 Right lower quadrant pain: Secondary | ICD-10-CM | POA: Insufficient documentation

## 2018-09-03 ENCOUNTER — Ambulatory Visit (INDEPENDENT_AMBULATORY_CARE_PROVIDER_SITE_OTHER): Payer: Medicare Other | Admitting: Pharmacist

## 2018-09-03 ENCOUNTER — Ambulatory Visit (INDEPENDENT_AMBULATORY_CARE_PROVIDER_SITE_OTHER): Payer: Medicare Other | Admitting: Nurse Practitioner

## 2018-09-03 ENCOUNTER — Encounter: Payer: Self-pay | Admitting: Nurse Practitioner

## 2018-09-03 ENCOUNTER — Other Ambulatory Visit: Payer: Self-pay

## 2018-09-03 ENCOUNTER — Ambulatory Visit: Payer: Self-pay | Admitting: *Deleted

## 2018-09-03 VITALS — BP 111/69 | HR 59 | Temp 98.4°F | Ht 61.71 in | Wt 181.0 lb

## 2018-09-03 DIAGNOSIS — I10 Essential (primary) hypertension: Secondary | ICD-10-CM

## 2018-09-03 DIAGNOSIS — E6609 Other obesity due to excess calories: Secondary | ICD-10-CM

## 2018-09-03 DIAGNOSIS — Z6829 Body mass index (BMI) 29.0-29.9, adult: Secondary | ICD-10-CM | POA: Insufficient documentation

## 2018-09-03 DIAGNOSIS — E700 Classical phenylketonuria: Secondary | ICD-10-CM | POA: Diagnosis not present

## 2018-09-03 DIAGNOSIS — Z6833 Body mass index (BMI) 33.0-33.9, adult: Secondary | ICD-10-CM

## 2018-09-03 DIAGNOSIS — E701 Other hyperphenylalaninemias: Secondary | ICD-10-CM

## 2018-09-03 DIAGNOSIS — K760 Fatty (change of) liver, not elsewhere classified: Secondary | ICD-10-CM | POA: Diagnosis not present

## 2018-09-03 DIAGNOSIS — E1140 Type 2 diabetes mellitus with diabetic neuropathy, unspecified: Secondary | ICD-10-CM

## 2018-09-03 DIAGNOSIS — Z0000 Encounter for general adult medical examination without abnormal findings: Secondary | ICD-10-CM | POA: Diagnosis not present

## 2018-09-03 DIAGNOSIS — E119 Type 2 diabetes mellitus without complications: Secondary | ICD-10-CM

## 2018-09-03 DIAGNOSIS — E785 Hyperlipidemia, unspecified: Secondary | ICD-10-CM

## 2018-09-03 DIAGNOSIS — E669 Obesity, unspecified: Secondary | ICD-10-CM | POA: Insufficient documentation

## 2018-09-03 DIAGNOSIS — E1169 Type 2 diabetes mellitus with other specified complication: Secondary | ICD-10-CM

## 2018-09-03 DIAGNOSIS — R1031 Right lower quadrant pain: Secondary | ICD-10-CM

## 2018-09-03 MED ORDER — FENOFIBRATE 48 MG PO TABS: 48.0000 mg | ORAL_TABLET | Freq: Every day | ORAL | 1 refills | Status: DC

## 2018-09-03 MED ORDER — METFORMIN HCL 500 MG PO TABS: 500.0000 mg | ORAL_TABLET | Freq: Two times a day (BID) | ORAL | 3 refills | Status: DC

## 2018-09-03 NOTE — Assessment & Plan Note (Signed)
Mental delays present.  Referral to dietician to discuss further PKU diet and diabetic diet.

## 2018-09-03 NOTE — Assessment & Plan Note (Signed)
Chronic, stable with BP below goal.  Continue current medication regimen.

## 2018-09-03 NOTE — Assessment & Plan Note (Signed)
Improved today.  Alk phos slightly elevated from baseline on labs recently.  Recent u/s negative for acute findings.  Has known NAFLD.  Return in 4 weeks for repeat labs or sooner if worsening pain or symptoms.

## 2018-09-03 NOTE — Assessment & Plan Note (Signed)
Continue to monitor LFT every few months.  WNL at this time.  Alk phos slightly elevated, monitor closely and return to GI as needed.

## 2018-09-03 NOTE — Assessment & Plan Note (Signed)
Chronic, ongoing.  A1C 8.3%.  Continue Invokana and add on Metformin 500 MG BID. Recommend he check BS BID and document, book supplied for documenting.  Repeat A1C in 3 months.

## 2018-09-03 NOTE — Assessment & Plan Note (Signed)
Chronic, ongoing.  Continue current statin and add on Fenofibrate for elevation in trigs and h/o pancreatitis.  Return in 4 weeks for follow-up and labs.

## 2018-09-03 NOTE — Assessment & Plan Note (Signed)
Recommend continued focus on health diet choices and regular physical activity (30 minutes 5 days a week).

## 2018-09-03 NOTE — Progress Notes (Signed)
BP 111/69   Pulse (!) 59   Temp 98.4 F (36.9 C) (Oral)   Ht 5' 1.71" (1.567 m)   Wt 181 lb (82.1 kg)   SpO2 98%   BMI 33.41 kg/m    Subjective:    Patient ID: Alex Metro., male    DOB: December 19, 1960, 58 y.o.   MRN: [id number]  HPI: [name]. is a 58 y.o. male presenting on 09/03/2018 for comprehensive medical examination. 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  Requires frequent redirection and reiteration of education provided.    DIABETES Poor historian.  Reported at last visit nephrology told him not to take Metformin as "it caused my kidney cyst and could shut my kidneys down".  On review of previous PCP notes and discussion with Dr. Juleen China + review of last nephrology note, this medication was no discontinued.  Patient then reports that a family member took it and had to have dialysis.  Discussed at length with patient, along with assistance from [facility] pharmacist, that he would benefit from starting Metformin again.  He agrees with this plan.  A1C last visit 8.3.  He continues on Invokana.  Endorses a poor diet, drinking Cheerwine often. Hypoglycemic episodes:no Polydipsia/polyuria: no Visual disturbance: no Chest pain: no Paresthesias: no Glucose Monitoring: yes  Accucheck frequency: Daily  Fasting glucose: 212 normally  Post prandial:  Evening:  Before meals: Taking Insulin?: no  Long acting insulin:  Short acting insulin: Blood Pressure Monitoring: not checking Retinal Examination: Not up to date Foot Exam: Not up to Date Diabetic Education: Not Completed Pneumovax: Up to Date Influenza: Up to Date Aspirin: no   ABDOMINAL PAIN  Recent imaging negative.  Has been followed by GI in past, last year, and general surgery. Saw Dr. Bary Castilla 04/23/17 for  abdominal pain.  Previous scans have noted NAFLD.  On review of notes CT scan in February 2019 noted no new areas of concern and is to return to Dr. Bary Castilla as needed.  Recent u/s 09/01/2018 was negative for for acute findings or hernia.  He is a poor historian and is vague in regard to area of pain, will state it is upper and then lower.  Reports a regular bowel pattern with no blood in stool, no straining. Duration:over a year Onset: gradual Severity: mild Quality:dull Location:  diffuse  Episode duration:  Radiation: no Frequency: intermittent Alleviating factors:  Aggravating factors: Status: stable Treatments attempted: PPI Fever: no Nausea: no Vomiting: no Weight loss: no Decreased appetite: no Diarrhea: no Constipation: no Blood in stool: no Heartburn: no Jaundice: no Rash: no Dysuria/urinary frequency: no Hematuria: no History of sexually transmitted disease: no Recurrent NSAID use: no   HYPERTENSION / HYPERLIPIDEMIA Has h/o pancreatitis hospital admissions, trigs on recent labs above 400.  Discussed with him adding medication to lower trigs to help prevent further admission for pancreatitis. Satisfied with current treatment? yes Duration of hypertension: chronic BP monitoring frequency: not checking BP range:  BP medication side effects: no Duration of hyperlipidemia: chronic Cholesterol medication side effects: no Cholesterol supplements: none Medication compliance: good compliance Aspirin: no Recent stressors: no Recurrent headaches: no Visual changes: no Palpitations: no Dyspnea: no Chest pain: no Lower extremity edema: no Dizzy/lightheaded: no  Functional Status Survey: Is the patient deaf or have  difficulty hearing?: No Does the patient have difficulty seeing, even when wearing glasses/contacts?: No Does the patient have difficulty concentrating, remembering, or making decisions?: No Does the patient have difficulty walking or climbing stairs?: No  Does the patient have difficulty dressing or bathing?: No Does the patient have difficulty doing errands alone such as visiting a doctor's office or shopping?: No  FALL RISK: Fall Risk  09/03/2018 08/19/2018 05/22/2017 05/15/2016  Falls in the past year? 0 0 Yes Yes  Number falls in past yr: - - 2 or more 2 or more  Injury with Fall? - - No No  Follow up Falls evaluation completed - Falls prevention discussed Falls prevention discussed    Depression Screen Depression screen [facility] 2/9 08/19/2018 08/04/2018 05/22/2017 05/22/2017 05/15/2016  Decreased Interest 0 _0 Down, Depressed, Hopeless 0 0 0 0 0  PHQ - 2 Score 0 _1 Altered sleeping - 1 0 - 0  Tired, decreased energy - 1 3 - 3  Change in appetite - 0 0 - 0  Feeling bad or failure about yourself  - 0 0 - 0  Trouble concentrating - 0 0 - 3  Moving slowly or fidgety/restless - 0 1 - 3  Suicidal thoughts - 0 0 - 0  PHQ-9 Score - 3 5 - 12  Difficult doing work/chores - Not difficult at all Somewhat difficult - -    Past Medical History:  Past Medical History:  Diagnosis Date  . Arrhythmia   . Asthma   . Cyst of kidney, acquired   . Diabetes mellitus without complication ([place]) 5366   type 2  . GERD (gastroesophageal reflux disease)   . History of chicken pox   . History of measles as a child   . History of PKU   . Hyperlipidemia   . Hypertension   . IBS (irritable bowel syndrome)   . Irregular heart beat   . Mentally challenged   . Pancreatitis     Surgical History:  Past Surgical History:  Procedure Laterality Date  . Cardiac Catherization     [facility]  . CARDIAC CATHETERIZATION     [facility]  . COLONOSCOPY    . ESOPHAGOGASTRODUODENOSCOPY (EGD) WITH PROPOFOL N/A 12/27/2016   Procedure: ESOPHAGOGASTRODUODENOSCOPY (EGD) WITH PROPOFOL;  Surgeon: Lin Landsman, MD;  Location: [place];  Service: Gastroenterology;  Laterality: N/A;  . HEMORRHOID SURGERY    . Ligament Removal Left    of left thumb: dr. Cleda Mccreedy  .  ligament removal  of left thumb     Dr. Cleda Mccreedy  . NM GATED MYOCARDIAL STUDY (ARMX HX)  06/23/2014   [place]. Normal    Medications:  Current Outpatient Medications on File Prior to Visit  Medication Sig  . canagliflozin (INVOKANA) 300 MG TABS tablet Take 1 tablet (300 mg total) by mouth daily before breakfast.  . Cholecalciferol (VITAMIN D3) 1.25 MG (50000 UT) CAPS Take 50,000 Units by mouth once a week.  [name] EPINEPHrine 0.3 mg/0.3 mL IJ SOAJ injection Inject 0.3 mLs (0.3 mg total) into the muscle once as needed for up to 1 dose.  . fluticasone (FLOVENT HFA) 110 MCG/ACT inhaler Inhale 1 puff into the lungs 2 (two) times daily.  . Fluticasone Propionate, Inhal, (FLOVENT DISKUS) 100 MCG/BLIST AEPB inhale 1 puff by mouth twice a day Rinse mouth after use  . gabapentin (NEURONTIN) 300 MG capsule TAKE 1 CAPSULE BY MOUTH AT BEDTIME FOR ONE WEEK  THEN TAKE ONE CAPSULE TWICE  DAILY  . lisinopril (ZESTRIL) 20 MG tablet TAKE 1 TABLET BY MOUTH ONCE DAILY  . montelukast (SINGULAIR) 10 MG tablet TAKE 1 TABLET BY MOUTH ONCE EVERY EVENING AT BEDTIME.  . nitroGLYCERIN (NITROSTAT) 0.4 MG SL tablet Place 1 tablet (0.4 mg total) under the tongue every 5 (five) minutes as needed for chest pain.  [name] omeprazole (PRILOSEC) 20 MG capsule TAKE 1 CAPSULE BY MOUTH ONCE DAILY  . ONE TOUCH ULTRA TEST test strip TEST ONCE DAILY  . OneTouch Delica Lancets 54T MISC TEST ONCE A DAY.  Glory Rosebush DELICA LANCETS FINE MISC TEST once daily  . PROAIR HFA 108 (90 Base) MCG/ACT inhaler INHALE 2 PUFFS BY MOUTH EVERY 6 HOURS IF NEEDED FOR COUGH OR WHEEZING.  . simvastatin (ZOCOR) 40 MG tablet TAKE 1 TABLET BY MOUTH ONCE DAILY  . tiZANidine (ZANAFLEX) 4 MG capsule Take 4 mg by mouth 3 (three) times daily as needed for muscle spasms.  [name] triamcinolone cream (KENALOG) 0.5 % apply to affected area twice a day   No current facility-administered medications on file prior to visit.     Allergies:  Allergies  Allergen Reactions  . Bee  Venom Hives    All kinds of bees  . Other     Certain powders  . Sulfa Antibiotics     Social History:  Social History   Socioeconomic History  . Marital status: Married    Spouse name: Not on file  . Number of children: 0  . Years of education: Not on file  . Highest education level: 12th grade  Occupational History  . Occupation: Disabled  Social Needs  . Financial resource strain: Not hard at all  . Food insecurity    Worry: Never true    Inability: Never true  . Transportation needs    Medical: No    Non-medical: No  Tobacco Use  . Smoking status: Never Smoker  . Smokeless tobacco: Never Used  Substance and Sexual Activity  . Alcohol use: No    Alcohol/week: 0.0 standard drinks  . Drug use: No  . Sexual activity: Not on file  Lifestyle  . Physical activity    Days per week: 0 days    Minutes per session: 0 min  . Stress: Only a little  Relationships  . Social Herbalist on phone: Patient refused    Gets together: Patient refused    Attends religious service: Patient refused    Active member of club or organization: Patient refused    Attends meetings of clubs or organizations: Patient refused    Relationship status: Patient refused  . Intimate partner violence    Fear of current or ex partner: Patient refused    Emotionally abused: Patient refused    Physically abused: Patient refused    Forced sexual activity: Patient refused  Other Topics Concern  . Not on file  Social History Narrative   ** Merged History Encounter **       ** Data from: 10/22/14 Enc Dept: [facility]       ** Data from: 10/07/14 Enc Dept: [facility]   Arrest: Arrested in 2012 for indecent liberties, required to wear ankle monitor   Social History   Tobacco Use  Smoking Status Never Smoker  Smokeless Tobacco Never Used   Social History   Substance and Sexual Activity  Alcohol Use No  . Alcohol/week: 0.0 standard drinks    Family History:   Family History  Problem  Relation Age of Onset  . Cancer Mother        throat  . Diabetes Father   . Heart disease Father   . Cancer Maternal Grandmother   . Cancer Maternal Grandfather        pancreatic  . Cancer Paternal Grandfather     Past medical history, surgical history, medications, allergies, family history and social history reviewed with patient today and changes made to appropriate areas of the chart.   Review of Systems - abdominal pain All other ROS negative except what is listed above and in the HPI.      Objective:    BP 111/69   Pulse (!) 59   Temp 98.4 F (36.9 C) (Oral)   Ht 5' 1.71" (1.567 m)   Wt 181 lb (82.1 kg)   SpO2 98%   BMI 33.41 kg/m   Wt Readings from Last 3 Encounters:  09/03/18 181 lb (82.1 kg)  08/04/18 184 lb (83.5 kg)  03/31/18 184 lb (83.5 kg)    Physical Exam Vitals signs and nursing note reviewed.  Constitutional:      General: He is awake. He is not in acute distress.    Appearance: He is well-developed. He is obese. He is not ill-appearing.  HENT:     Head: Normocephalic and atraumatic.     Right Ear: Hearing normal. No drainage.     Left Ear: Hearing normal. No drainage.     Mouth/Throat:     Pharynx: Uvula midline.  Eyes:     General: Lids are normal.        Right eye: No discharge.        Left eye: No discharge.     Conjunctiva/sclera: Conjunctivae normal.     Pupils: Pupils are equal, round, and reactive to light.  Neck:     Musculoskeletal: Normal range of motion and neck supple.     Thyroid: No thyromegaly.     Vascular: No carotid bruit or JVD.     Trachea: Trachea normal.  Cardiovascular:     Rate and Rhythm: Normal rate and regular rhythm.     Heart sounds: Normal heart sounds, S1 normal and S2 normal. No murmur. No gallop.   Pulmonary:     Effort: Pulmonary effort is normal.     Breath sounds: Normal breath sounds.  Abdominal:     General: Bowel sounds are normal.     Palpations: Abdomen is soft. There is  no hepatomegaly or splenomegaly.     Tenderness: There is no abdominal tenderness. There is no right CVA tenderness, left CVA tenderness, guarding or rebound. Negative signs include Murphy's sign.  Musculoskeletal: Normal range of motion.     Right lower leg: No edema.     Left lower leg: No edema.  Skin:    General: Skin is warm and dry.     Capillary Refill: Capillary refill takes less than 2 seconds.     Findings: No rash.  Neurological:     Mental Status: He is alert and oriented to person, place, and time.     Deep Tendon Reflexes: Reflexes are normal and symmetric.  Psychiatric:        Mood and Affect: Mood normal.        Behavior: Behavior normal. Behavior is cooperative.        Thought Content: Thought content normal.        Judgment: Judgment normal.     Results for orders placed or performed in visit  on 08/27/18  Comprehensive metabolic panel  Result Value Ref Range   Glucose 168 (H) 65 - 99 mg/dL   BUN 12 6 - 24 mg/dL   Creatinine, Ser 1.14 0.76 - 1.27 mg/dL   GFR calc non Af Amer 72 >59 mL/min/1.73   GFR calc Af Amer 83 >59 mL/min/1.73   BUN/Creatinine Ratio 11 9 - 20   Sodium 142 134 - 144 mmol/L   Potassium 4.1 3.5 - 5.2 mmol/L   Chloride 107 (H) 96 - 106 mmol/L   CO2 18 (L) 20 - 29 mmol/L   Calcium 9.2 8.7 - 10.2 mg/dL   Total Protein 7.2 6.0 - 8.5 g/dL   Albumin 4.6 3.8 - 4.9 g/dL   Globulin, Total 2.6 1.5 - 4.5 g/dL   Albumin/Globulin Ratio 1.8 1.2 - 2.2   Bilirubin Total 0.5 0.0 - 1.2 mg/dL   Alkaline Phosphatase 155 (H) 39 - 117 IU/L   AST 36 0 - 40 IU/L   ALT 40 0 - 44 IU/L  Lipid Panel w/o Chol/HDL Ratio  Result Value Ref Range   Cholesterol, Total 174 100 - 199 mg/dL   Triglycerides 420 (H) 0 - 149 mg/dL   HDL 34 (L) >39 mg/dL   VLDL Cholesterol Cal Comment 5 - 40 mg/dL   LDL Calculated Comment 0 - 99 mg/dL  HgB A1c  Result Value Ref Range   Hgb A1c MFr Bld 8.3 (H) 4.8 - 5.6 %   Est. average glucose Bld gHb Est-mCnc 192 mg/dL       Assessment & Plan:   Problem List Items Addressed This Visit      Cardiovascular and Mediastinum   Hypertension    Chronic, stable with BP below goal.  Continue current medication regimen.        Relevant Medications   fenofibrate (TRICOR) 48 MG tablet     Digestive   NAFLD (nonalcoholic fatty liver disease)    Continue to monitor LFT every few months.  WNL at this time.  Alk phos slightly elevated, monitor closely and return to GI as needed.        Endocrine   Diabetes mellitus type 2, controlled (HCC)    Chronic, ongoing.  A1C 8.3%.  Continue Invokana and add on Metformin 500 MG BID. Recommend he check BS BID and document, book supplied for documenting.  Repeat A1C in 3 months.      Relevant Medications   metFORMIN (GLUCOPHAGE) 500 MG tablet   Hyperlipidemia associated with type 2 diabetes mellitus (HCC)    Chronic, ongoing.  Continue current statin and add on Fenofibrate for elevation in trigs and h/o pancreatitis.  Return in 4 weeks for follow-up and labs.      Relevant Medications   metFORMIN (GLUCOPHAGE) 500 MG tablet   fenofibrate (TRICOR) 48 MG tablet     Other   Abdominal pain    Improved today.  Alk phos slightly elevated from baseline on labs recently.  Recent u/s negative for acute findings.  Has known NAFLD.  Return in 4 weeks for repeat labs or sooner if worsening pain or symptoms.        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  Referral to dietician to discuss further PKU diet and diabetic diet.      Relevant Orders   Amb ref to Medical Nutrition Therapy-MNT   Obesity    Recommend continued focus on health diet choices and regular physical activity (30 minutes 5 days a week).  Relevant Medications   metFORMIN (GLUCOPHAGE) 500 MG tablet    Other Visit Diagnoses    Annual physical exam    -  Primary   Labs at last visit, refuses further today.  Will obtain further labs next visit.       Discussed aspirin prophylaxis for  myocardial infarction prevention and decision was that we recommended ASA, and patient refused  LABORATORY TESTING:  Health maintenance labs ordered today as discussed above.   The natural history of prostate cancer and ongoing controversy regarding screening and potential treatment outcomes of prostate cancer has been discussed with the patient. The meaning of a false positive PSA and a false negative PSA has been discussed. He indicates understanding of the limitations of this screening test and wishes not to proceed with screening PSA testing today.  Reports he will obtain labs next visit.   IMMUNIZATIONS:   - Tdap: Tetanus vaccination status reviewed: last tetanus booster within 10 years. - Influenza: Up to date - Pneumovax: Up to date - Prevnar: Not applicable - Zostavax vaccine: Refused  SCREENING: - Colonoscopy: Up to date  Discussed with patient purpose of the colonoscopy is to detect colon cancer at curable precancerous or early stages   - AAA Screening: Not applicable  -Hearing Test: Not applicable  -Spirometry: Not applicable   PATIENT COUNSELING:    Sexuality: Discussed sexually transmitted diseases, partner selection, use of condoms, avoidance of unintended pregnancy  and contraceptive alternatives.   Advised to avoid cigarette smoking.  I discussed with the patient that most people either abstain from alcohol or drink within safe limits (<=14/week and <=4 drinks/occasion for males, <=7/weeks and <= 3 drinks/occasion for females) and that the risk for alcohol disorders and other health effects rises proportionally with the number of drinks per week and how often a drinker exceeds daily limits.  Discussed cessation/primary prevention of drug use and availability of treatment for abuse.   Diet: Encouraged to adjust caloric intake to maintain  or achieve ideal body weight, to reduce intake of dietary saturated fat and total fat, to limit sodium intake by avoiding high  sodium foods and not adding table salt, and to maintain adequate dietary potassium and calcium preferably from fresh fruits, vegetables, and low-fat dairy products.    stressed the importance of regular exercise  Injury prevention: Discussed safety belts, safety helmets, smoke detector, smoking near bedding or upholstery.   Dental health: Discussed importance of regular tooth brushing, flossing, and dental visits.   Follow up plan: NEXT PREVENTATIVE PHYSICAL DUE IN 1 YEAR. Return in about 4 weeks (around 10/01/2018) for T2DM.

## 2018-09-03 NOTE — Patient Instructions (Signed)
Visit Information  Goals      Patient Stated   . "I want to work on my diabetes" (pt-stated)     Current Barriers:  [name] Knowledge Deficits related to management of diabetes . Uncontrolled T2DM, A1c 8.3%. Tx Invokana 300 mg daily; hx metformin therapy, however, he and his wife believed that nephrology said metformin caused renal mass and renal damage . Hx hypoglycemia with glipizide  . Endorses burning/tingling pain in feet/legs and hands  . Patient reports drinking Cheerwine and Coca Cola throughout the day  . Wife helps manage medications; they have a pill box, but generally not using  Pharmacist Clinical Goal(s):  [name] Over the next 90 days, patient will work with primary care provider and care team to address needs related to management of diabetes  Interventions: . Comprehensive medication review performed. . Discussed benefit of restarting metformin therapy. Explained to patient that metformin does not cause renal damage. Starting metformin 500 mg BID . Extensive dietary counseling regarding reducing soda intake   Patient Self Care Activities:  . Currently UNABLE TO independently manage medications . Patient will start checking blood sugar twice daily - fasting and 2 hour post prandial   Initial goal documentation     . I need help with my Diet (pt-stated)      Other   . DIET - INCREASE WATER INTAKE     Recommend increasing water intake to 4 glasses a day.     . Increase water intake     Starting 05/15/16, I will start drinking 2-3 glasses of water a day.       The patient verbalized understanding of instructions provided today and declined a print copy of patient instruction materials.   Plan: - Will continue to collaborate with primary care provider on management of diabetes and other chronic conditions  Catie Darnelle Maffucci, PharmD Clinical Pharmacist [place] [phone]

## 2018-09-03 NOTE — Chronic Care Management (AMB) (Signed)
Chronic Care Management   Note  09/03/2018 Name: [name]. MRN: [id number] DOB: 06/10/1960   Subjective:  [name]. is a 58 y.o. year old male who is a primary care patient of Cannady, Barbaraann Faster, NP. The CCM team was consulted for assistance with chronic disease management and care coordination needs.     [name] was given information about Chronic Care Management services today including:  1. CCM service includes personalized support from designated clinical staff supervised by his physician, including individualized plan of care and coordination with other care providers 2. 24/7 contact phone numbers for assistance for urgent and routine care needs. 3. Service will only be billed when office clinical staff spend 20 minutes or more in a month to coordinate care. 4. Only one practitioner may furnish and bill the service in a calendar month. 5. The patient may stop CCM services at any time (effective at the end of the month) by phone call to the office staff. 6. The patient will be responsible for cost sharing (co-pay) of up to 20% of the service fee (after annual deductible is met).  Patient agreed to services and verbal consent obtained.   Review of patient status, including review of consultants reports, laboratory and other test data, was performed as part of comprehensive evaluation and provision of chronic care management services.   Objective:  Lab Results  Component Value Date   CREATININE 1.14 08/27/2018   CREATININE 1.17 08/04/2018   CREATININE 1.18 11/04/2017    Lab Results  Component Value Date   HGBA1C 8.3 (H) 08/27/2018       Component Value Date/Time   CHOL 174 08/27/2018 1604   TRIG 420 (H) 08/27/2018 1604   HDL 34 (L) 08/27/2018 1604   CHOLHDL 3.9 08/04/2018 1458   [place] Comment 08/27/2018 1604    Clinical ASCVD: No  The 10-year ASCVD risk score [name]., et al., 2013) is: 11.4%   Values used to calculate the score:      Age: 22 years     Sex: Male     Is Non-Hispanic African American: No     Diabetic: Yes     Tobacco smoker: No     Systolic Blood Pressure: 952 mmHg     Is BP treated: Yes     HDL Cholesterol: 34 mg/dL     Total Cholesterol: 174 mg/dL    BP Readings from Last 3 Encounters:  09/03/18 111/69  08/27/18 114/72  08/04/18 122/74    Allergies  Allergen Reactions  . Bee Venom Hives    All kinds of bees  . Other     Certain powders  . Sulfa Antibiotics     Medications Reviewed Today    Reviewed by Amada Kingfisher, CMA (Certified Medical Assistant) on 09/03/18 at 64  Med List Status: <None>  Medication Order Taking? Sig Documenting Provider Last Dose Status Informant  canagliflozin (INVOKANA) 300 MG TABS tablet [id number] Yes Take 1 tablet (300 mg total) by mouth daily before breakfast. Birdie Sons, MD Taking Active   Cholecalciferol (VITAMIN D3) 1.25 MG (50000 UT) CAPS [id number] Yes Take 50,000 Units by mouth once a week. Marnee Guarneri T, NP Taking Active   EPINEPHrine 0.3 mg/0.3 mL IJ SOAJ injection [id number] Yes Inject 0.3 mLs (0.3 mg total) into the muscle once as needed for up to 1 dose. Birdie Sons, MD Taking Active   fluticasone [facility] HFA) 110 MCG/ACT inhaler [id number] Yes Inhale 1 puff  into the lungs 2 (two) times daily. Birdie Sons, MD Taking Active   Fluticasone Propionate, Inhal, (FLOVENT DISKUS) 100 MCG/BLIST AEPB [id number] Yes inhale 1 puff by mouth twice a day Rinse mouth after use Birdie Sons, MD Taking Active   gabapentin (NEURONTIN) 300 MG capsule [id number] Yes TAKE 1 CAPSULE BY MOUTH AT BEDTIME FOR ONE WEEK  THEN TAKE ONE CAPSULE TWICE DAILY Birdie Sons, MD Taking Active   lisinopril (ZESTRIL) 20 MG tablet [id number] Yes TAKE 1 TABLET BY MOUTH ONCE DAILY Birdie Sons, MD Taking Active   montelukast (SINGULAIR) 10 MG tablet [id number] Yes TAKE 1 TABLET BY MOUTH ONCE EVERY EVENING AT BEDTIME. Birdie Sons, MD Taking Active   nitroGLYCERIN  (NITROSTAT) 0.4 MG SL tablet [id number] Yes Place 1 tablet (0.4 mg total) under the tongue every 5 (five) minutes as needed for chest pain. Birdie Sons, MD Taking Active   omeprazole (PRILOSEC) 20 MG capsule [id number] Yes TAKE 1 CAPSULE BY MOUTH ONCE DAILY Birdie Sons, MD Taking Active   ONE TOUCH ULTRA TEST test strip [id number] Yes TEST ONCE DAILY Birdie Sons, MD Taking Active   OneTouch Delica Lancets [address] [place] [id number] Yes TEST ONCE A DAY. Birdie Sons, MD Taking Active   [facility] LANCETS [place] [id number] Yes TEST once daily Birdie Sons, MD Taking Active   [facility] HFA 108 [id number] Base) MCG/ACT inhaler [id number] Yes INHALE 2 PUFFS BY MOUTH EVERY 6 HOURS IF NEEDED FOR COUGH OR WHEEZING. Birdie Sons, MD Taking Active   simvastatin (ZOCOR) 40 MG tablet [id number] Yes TAKE 1 TABLET BY MOUTH ONCE DAILY Birdie Sons, MD Taking Active   tiZANidine (ZANAFLEX) 4 MG capsule [id number] Yes Take 4 mg by mouth 3 (three) times daily as needed for muscle spasms. [provider] Taking Active   triamcinolone cream (KENALOG) 0.5 % [id number] Yes apply to affected area twice a day Birdie Sons, MD Taking Active            Assessment:   Goals Addressed            This Visit's Progress     Patient Stated   . "I want to work on my diabetes" (pt-stated)       Current Barriers:  [name] Knowledge Deficits related to management of diabetes . Uncontrolled T2DM, A1c 8.3%. Tx Invokana 300 mg daily; hx metformin therapy, however, he and his wife believed that nephrology said metformin caused renal mass and renal damage . Hx hypoglycemia with glipizide  . Endorses burning/tingling pain in feet/legs and hands  . Patient reports drinking Cheerwine and Coca Cola throughout the day  . Wife helps manage medications; they have a pill box, but generally not using  Pharmacist Clinical Goal(s):  [name] Over the next 90 days, patient will work with primary care provider and care  team to address needs related to management of diabetes  Interventions: . Comprehensive medication review performed. . Discussed benefit of restarting metformin therapy. Explained to patient that metformin does not cause renal damage. Starting metformin 500 mg BID . Extensive dietary counseling regarding reducing soda intake   Patient Self Care Activities:  . Currently UNABLE TO independently manage medications . Patient will start checking blood sugar twice daily - fasting and 2 hour post prandial   Initial goal documentation        Plan: - Will continue to collaborate with primary care provider on management of diabetes and other chronic conditions  Catie Darnelle Maffucci, PharmD Clinical Pharmacist [place] [phone]

## 2018-09-03 NOTE — Patient Instructions (Signed)
Carbohydrate Counting for Diabetes Mellitus, Adult  Carbohydrate counting is a method of keeping track of how many carbohydrates you eat. Eating carbohydrates naturally increases the amount of sugar (glucose) in the blood. Counting how many carbohydrates you eat helps keep your blood glucose within normal limits, which helps you manage your diabetes (diabetes mellitus). It is important to know how many carbohydrates you can safely have in each meal. This is different for every person. A diet and nutrition specialist (registered dietitian) can help you make a meal plan and calculate how many carbohydrates you should have at each meal and snack. Carbohydrates are found in the following foods:  Grains, such as breads and cereals.  Dried beans and soy products.  Starchy vegetables, such as potatoes, peas, and corn.  Fruit and fruit juices.  Milk and yogurt.  Sweets and snack foods, such as cake, cookies, candy, chips, and soft drinks. How do I count carbohydrates? There are two ways to count carbohydrates in food. You can use either of the methods or a combination of both. Reading "Nutrition Facts" on packaged food The "Nutrition Facts" list is included on the labels of almost all packaged foods and beverages in the [place] It includes:  The serving size.  Information about nutrients in each serving, including the grams (g) of carbohydrate per serving. To use the "Nutrition Facts":  Decide how many servings you will have.  Multiply the number of servings by the number of carbohydrates per serving.  The resulting number is the total amount of carbohydrates that you will be having. Learning standard serving sizes of other foods When you eat carbohydrate foods that are not packaged or do not include "Nutrition Facts" on the label, you need to measure the servings in order to count the amount of carbohydrates:  Measure the foods that you will eat with a food scale or measuring cup, if needed.   Decide how many standard-size servings you will eat.  Multiply the number of servings by 15. Most carbohydrate-rich foods have about 15 g of carbohydrates per serving. ? For example, if you eat 8 oz (170 g) of strawberries, you will have eaten 2 servings and 30 g of carbohydrates (2 servings x 15 g = 30 g).  For foods that have more than one food mixed, such as soups and casseroles, you must count the carbohydrates in each food that is included. The following list contains standard serving sizes of common carbohydrate-rich foods. Each of these servings has about 15 g of carbohydrates:   hamburger bun or  English muffin.   oz (15 mL) syrup.   oz (14 g) jelly.  1 slice of bread.  1 six-inch tortilla.  3 oz (85 g) cooked rice or pasta.  4 oz (113 g) cooked dried beans.  4 oz (113 g) starchy vegetable, such as peas, corn, or potatoes.  4 oz (113 g) hot cereal.  4 oz (113 g) mashed potatoes or  of a large baked potato.  4 oz (113 g) canned or frozen fruit.  4 oz (120 mL) fruit juice.  4-6 crackers.  6 chicken nuggets.  6 oz (170 g) unsweetened dry cereal.  6 oz (170 g) plain fat-free yogurt or yogurt sweetened with artificial sweeteners.  8 oz (240 mL) milk.  8 oz (170 g) fresh fruit or one small piece of fruit.  24 oz (680 g) popped popcorn. Example of carbohydrate counting Sample meal  3 oz (85 g) chicken breast.  6 oz (170 g)  brown rice.  4 oz (113 g) corn.  8 oz (240 mL) milk.  8 oz (170 g) strawberries with sugar-free whipped topping. Carbohydrate calculation 1. Identify the foods that contain carbohydrates: ? Rice. ? Corn. ? Milk. ? Strawberries. 2. Calculate how many servings you have of each food: ? 2 servings rice. ? 1 serving corn. ? 1 serving milk. ? 1 serving strawberries. 3. Multiply each number of servings by 15 g: ? 2 servings rice x 15 g = 30 g. ? 1 serving corn x 15 g = 15 g. ? 1 serving milk x 15 g = 15 g. ? 1 serving  strawberries x 15 g = 15 g. 4. Add together all of the amounts to find the total grams of carbohydrates eaten: ? 30 g + 15 g + 15 g + 15 g = 75 g of carbohydrates total. Summary  Carbohydrate counting is a method of keeping track of how many carbohydrates you eat.  Eating carbohydrates naturally increases the amount of sugar (glucose) in the blood.  Counting how many carbohydrates you eat helps keep your blood glucose within normal limits, which helps you manage your diabetes.  A diet and nutrition specialist (registered dietitian) can help you make a meal plan and calculate how many carbohydrates you should have at each meal and snack. This information is not intended to replace advice given to you by your health care provider. Make sure you discuss any questions you have with your health care provider. Document Released: 03/05/2005 Document Revised: 09/12/2016 Document Reviewed: 08/17/2015 Elsevier Interactive Patient Education  2019 [employer].

## 2018-09-03 NOTE — Chronic Care Management (AMB) (Signed)
  Chronic Care Management   Follow Up Note   09/03/2018 Name: [name]. MRN: [id number] DOB: [birth year]  Referred by: [name], NP Reason for referral : Chronic Care Management (DM and Diet )   [name]. is a 58 y.o. year old male who is a primary care patient of [name], Alex Faster, NP. The CCM team was consulted for assistance with chronic disease management and care coordination needs.    Review of patient status, including review of consultants reports, relevant laboratory and other test results, and collaboration with appropriate care team members and the patient's provider was performed as part of comprehensive patient evaluation and provision of chronic care management services.    Goals Addressed            This Visit's Progress   . 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 Film/video editor.  . Literacy barriers . Cognitive Deficits  Nurse Case Manager Clinical Goal(s):  [name] Over the next 90 days, patient will work with [facility]  to address needs related to learning what patient can eat on Diabetic and PKU diet . Over the next 90 days, patient will work with Administrator, sports (community agency) to learn what he can eat on a diabetic and PKU diet.  Interventions:  . Provided education to patient re: Diabetes . Collaborated with PCP regarding dietician referral  . Discussed plans with patient for ongoing care management follow up and provided patient with direct contact information for care management team . Provided patient with calendar for appointments which includes a log to record Blood Sugars.  . Encouraged patient to reduce the amount of sugary drinks he is currently drinking  Patient Self Care Activities:  . Currently UNABLE TO independently manage complicated diet restrictions  Initial goal documentation         The care management team will  reach out to the patient again over the next 21 days.  The patient has been provided with contact information for the care management team and has been advised to call with any health related questions or concerns.    [name] Adelynn Gipe RN, BSN Nurse Case Editor, [facility] Care Management  [phone]) Business Mobile

## 2018-09-04 NOTE — Patient Instructions (Signed)
Thank you allowing the Chronic Care Management Team to be a part of your care! It was a pleasure speaking with you today!   CCM (Chronic Care Management) Team   Vernestine Brodhead RN, BSN Nurse Care Coordinator  [phone]  Catie [facility] PharmD  Clinical Pharmacist  [phone]  Eula Fried LCSW Clinical Social Worker [phone]  Goals Addressed            This Visit's Progress   . 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 Film/video editor.  . Literacy barriers . Cognitive Deficits  Nurse Case Manager Clinical Goal(s):  [name] Over the next 90 days, patient will work with [facility]  to address needs related to learning what patient can eat on Diabetic and PKU diet . Over the next 90 days, patient will work with Administrator, sports (community agency) to learn what he can eat on a diabetic and PKU diet.  Interventions:  . Provided education to patient re: Diabetes . Collaborated with PCP regarding dietician referral  . Discussed plans with patient for ongoing care management follow up and provided patient with direct contact information for care management team . Provided patient with calendar for appointments which includes a log to record Blood Sugars.  . Encouraged patient to reduce the amount of sugary drinks he is currently drinking  Patient Self Care Activities:  . Currently UNABLE TO independently manage complicated diet restrictions  Initial goal documentation        The patient verbalized understanding of instructions provided today and declined a print copy of patient instruction materials.   The patient has been provided with contact information for the care management team and has been advised to call with any health related questions or concerns.

## 2018-09-23 ENCOUNTER — Other Ambulatory Visit: Payer: Self-pay | Admitting: Family Medicine

## 2018-09-24 ENCOUNTER — Ambulatory Visit: Payer: Medicare Other | Admitting: Licensed Clinical Social Worker

## 2018-09-24 ENCOUNTER — Ambulatory Visit: Payer: Medicare Other | Admitting: Nurse Practitioner

## 2018-09-24 ENCOUNTER — Other Ambulatory Visit: Payer: Self-pay

## 2018-09-24 DIAGNOSIS — E1140 Type 2 diabetes mellitus with diabetic neuropathy, unspecified: Secondary | ICD-10-CM

## 2018-09-24 DIAGNOSIS — R0602 Shortness of breath: Secondary | ICD-10-CM

## 2018-09-24 NOTE — Chronic Care Management (AMB) (Signed)
  Care Management   Follow Up Note   09/24/2018 Name: [name]. MRN: [id number] DOB: 09-21-1960  Referred by: [name], NP Reason for referral : Care Coordination   [name]. is a 58 y.o. year old male who is a primary care patient of Cannady, Barbaraann Faster, NP. The care management team was consulted for assistance with care management and care coordination needs.    Review of patient status, including review of consultants reports, relevant laboratory and other test results, and collaboration with appropriate care team members and the patient's provider was performed as part of comprehensive patient evaluation and provision of chronic care management services.    Goals Addressed    . "I want to improve my health and self-care" (pt-stated)   On track    Current Barriers:  . Financial constraints . Limited social support . Level of care concerns . ADL IADL limitations . Literacy concerns . Cognitive Deficits . Lacks knowledge of community resource: available financial support resources within the area as well as socialization opportunities within the area  Clinical Social Work Clinical Goal(s):  [name] Over the next 90 days, client will work with SW to address concerns related to improving self-care and implementing it whenever able  Interventions: . Patient interviewed and appropriate assessments performed . Provided mental health counseling with regard to managing everyday stressors and implementing appropriate self-care techniques in order to improve overall health. LCSW provided reflective listening and implemented appropriate interventions to help suppport patient and her emotional needs  . Provided patient with information about OTC benefits that he is uses per quarter. Patient's spouse reports that she has started to order diabetic lotion for patient and this is helping him immensely.--  . Patient reports trying to implement appropriate self-care by  exercise and mowing his yard. . Patient reports that he had a very low appetite last night and his blood sugar shot up to 576. Patient refused to go to the ED because he did not want to be exposed to [place]. Patient shares that he drank several glasses of water to get his blood sugar down and it was successful. LCSW will update CCM team.  . Discussed plans with patient for ongoing care management follow up and provided patient with direct contact information for care management team . Advised patient to contact CCM embedded practice providers and PCP f any urgent concerns arise. . Assisted patient/caregiver with obtaining information about health plan benefits . Provided education to patient/caregiver regarding level of care options.  Patient Self Care Activities:  . Attends all scheduled provider appointments . Calls provider office for new concerns or questions  Initial goal documentation     The care management team will reach out to the patient again within 30 days.  Eula Fried, BSW, MSW, [place] [facility] Care Management [email or web address] Phone: [phone]

## 2018-09-26 ENCOUNTER — Ambulatory Visit: Payer: Self-pay | Admitting: *Deleted

## 2018-09-26 DIAGNOSIS — E701 Other hyperphenylalaninemias: Secondary | ICD-10-CM

## 2018-09-26 DIAGNOSIS — E700 Classical phenylketonuria: Secondary | ICD-10-CM

## 2018-09-26 DIAGNOSIS — E1140 Type 2 diabetes mellitus with diabetic neuropathy, unspecified: Secondary | ICD-10-CM

## 2018-09-26 NOTE — Chronic Care Management (AMB) (Signed)
  Chronic Care Management   Follow Up Note   09/26/2018 Name: [name]. MRN: [id number] DOB: Aug 20, 1960  Referred by: [name], NP Reason for referral : Chronic Care Management (DM education)   [name]. is a 58 y.o. year old male who is a primary care patient of Cannady, Barbaraann Faster, NP. The CCM team was consulted for assistance with chronic disease management and care coordination needs.    Review of patient status, including review of consultants reports, relevant laboratory and other test results, and collaboration with appropriate care team members and the patient's provider was performed as part of comprehensive patient evaluation and provision of chronic care management services.    Goals Addressed            This Visit's Progress   . 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 Film/video editor.  . Literacy barriers . Cognitive Deficits  Nurse Case Manager Clinical Goal(s):  [name] Over the next 90 days, patient will work with [facility]  to address needs related to learning what patient can eat on Diabetic and PKU diet . Over the next 90 days, patient will work with Administrator, sports (community agency) to learn what he can eat on a diabetic and PKU diet.  Interventions:  . Provided education to patient re: Diabetes . Collaborated with PCP regarding dietician referral  . Discussed plans with patient for ongoing care management follow up and provided patient with direct contact information for care management team . Fasting sugars provided: 7/6-189, 7/7-576(drank lots of water and it came down to 222) 7/8-216, 7/9-204, 7/10-218 . Encouraged patient to continue to reduce carb intake and sugary drinks, spouse states he has been doing better about drinking water and has reduced his drinks some.  [name] Spouse states he is really having a hard time seeing because he needs  cataract surgery but it is 195$ per eye and they just can not afford it. Plan to talk with CCM LCSW about this.   Patient Self Care Activities:  . Currently UNABLE TO independently manage complicated diet restrictions  Please see past updates related to this goal by clicking on the "Past Updates" button in the selected goal          The care management team will reach out to the patient again over the next 30 days.  The patient has been provided with contact information for the care management team and has been advised to call with any health related questions or concerns.    [name] Derita Michelsen RN, BSN Nurse Case Editor, [facility]ing [facility] Care Management  [phone]) Business Mobile

## 2018-09-26 NOTE — Patient Instructions (Signed)
Thank you allowing the Chronic Care Management Team to be a part of your care! It was a pleasure speaking with you today!  CCM (Chronic Care Management) Team   Vaughn Frieze RN, BSN Nurse Care Coordinator  [phone]  Catie [facility] PharmD  Clinical Pharmacist  [phone]  Eula Fried LCSW Clinical Social Worker [phone]  Goals Addressed            This Visit's Progress   . 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 Film/video editor.  . Literacy barriers . Cognitive Deficits  Nurse Case Manager Clinical Goal(s):  [name] Over the next 90 days, patient will work with [facility]  to address needs related to learning what patient can eat on Diabetic and PKU diet . Over the next 90 days, patient will work with Administrator, sports (community agency) to learn what he can eat on a diabetic and PKU diet.  Interventions:  . Provided education to patient re: Diabetes . Collaborated with PCP regarding dietician referral  . Discussed plans with patient for ongoing care management follow up and provided patient with direct contact information for care management team . Fasting sugars provided: 7/6-189, 7/7-576(drank lots of water and it came down to 222) 7/8-216, 7/9-204, 7/10-218 . Encouraged patient to continue to reduce carb intake and sugary drinks, spouse states he has been doing better about drinking water and has reduced his drinks some.  [name] Spouse states he is really having a hard time seeing because he needs cataract surgery but it is 195$ per eye and they just can not afford it. Plan to talk with CCM LCSW about this.   Patient Self Care Activities:  . Currently UNABLE TO independently manage complicated diet restrictions  Please see past updates related to this goal by clicking on the "Past Updates" button in the selected goal         The patient verbalized understanding of instructions  provided today and declined a print copy of patient instruction materials.   The patient has been provided with contact information for the care management team and has been advised to call with any health related questions or concerns.

## 2018-09-30 ENCOUNTER — Ambulatory Visit: Payer: Self-pay | Admitting: *Deleted

## 2018-09-30 DIAGNOSIS — E700 Classical phenylketonuria: Secondary | ICD-10-CM

## 2018-09-30 DIAGNOSIS — E701 Other hyperphenylalaninemias: Secondary | ICD-10-CM

## 2018-09-30 DIAGNOSIS — E1140 Type 2 diabetes mellitus with diabetic neuropathy, unspecified: Secondary | ICD-10-CM

## 2018-09-30 NOTE — Chronic Care Management (AMB) (Signed)
  Chronic Care Management   Follow Up Note   09/30/2018 Name: [name]. MRN: [id number] DOB: May 10, 1960  Referred by: [name], NP Reason for referral : Care Coordination (Dietician)   [name]. is a 58 y.o. year old male who is a primary care patient of Cannady, Alex Faster, NP. The CCM team was consulted for assistance with chronic disease management and care coordination needs.    Review of patient status, including review of consultants reports, relevant laboratory and other test results, and collaboration with appropriate care team members and the patient's provider was performed as part of comprehensive patient evaluation and provision of chronic care management services.    Goals Addressed            This Visit's Progress   . 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 Film/video editor.  . Literacy barriers . Cognitive Deficits  Nurse Case Manager Clinical Goal(s):  [name] Over the next 90 days, patient will work with [facility]  to address needs related to learning what patient can eat on Diabetic and PKU diet . Over the next 90 days, patient will work with Administrator, sports (community agency) to learn what he can eat on a diabetic and PKU diet.  Interventions:  . Provided education to patient re: Diabetes . Collaborated with PCP regarding dietician referral  . Discussed plans with patient for ongoing care management follow up and provided patient with direct contact information for care management team . Fasting sugars provided: 7/6-189, 7/7-576(drank lots of water and it came down to 222) 7/8-216, 7/9-204, 7/10-218 . Encouraged patient to continue to reduce carb intake and sugary drinks, spouse states he has been doing better about drinking water and has reduced his drinks some.  [name] Spouse states he is really having a hard time seeing because he needs cataract  surgery but it is 195$ per eye and they just can not afford it. Plan to talk with CCM LCSW about this.  . Received a call from local [place]. Discussed patient's case and specific needs. Coeleen plans to reach out to her colleagues related to this being such a very specific and unique case. She will outreach me with details.    Patient Self Care Activities:  . Currently UNABLE TO independently manage complicated diet restrictions  Please see past updates related to this goal by clicking on the "Past Updates" button in the selected goal          The care management team will reach out to the patient again over the next 30 days.  The patient has been provided with contact information for the care management team and has been advised to call with any health related questions or concerns.   [name] Almin Livingstone RN, BSN Nurse Case Editor, commissioning Family Practice/[facility] Care Management  [phone]) Business Mobile

## 2018-10-01 ENCOUNTER — Encounter: Payer: Self-pay | Admitting: Nurse Practitioner

## 2018-10-01 ENCOUNTER — Ambulatory Visit (INDEPENDENT_AMBULATORY_CARE_PROVIDER_SITE_OTHER): Payer: Medicare Other | Admitting: Nurse Practitioner

## 2018-10-01 ENCOUNTER — Other Ambulatory Visit: Payer: Self-pay

## 2018-10-01 DIAGNOSIS — L282 Other prurigo: Secondary | ICD-10-CM | POA: Diagnosis not present

## 2018-10-01 DIAGNOSIS — E119 Type 2 diabetes mellitus without complications: Secondary | ICD-10-CM

## 2018-10-01 MED ORDER — LORATADINE 10 MG PO TABS: 10.0000 mg | ORAL_TABLET | Freq: Every day | ORAL | 11 refills | Status: DC

## 2018-10-01 MED ORDER — CLOBETASOL PROPIONATE 0.05 % EX CREA: 1.0000 "application " | TOPICAL_CREAM | Freq: Two times a day (BID) | CUTANEOUS | 0 refills | Status: DC

## 2018-10-01 NOTE — Progress Notes (Signed)
BP (!) 101/59 (BP Location: Right Arm, Patient Position: Sitting, Cuff Size: Normal)   Pulse (!) 59   Temp (!) 97.5 F (36.4 C) (Oral)   Ht 5' 4" (1.626 m)   Wt 175 lb (79.4 kg)   SpO2 97%   BMI 30.04 kg/m    Subjective:    Patient ID: Alex Metro., male    DOB: June 26, 1960, 58 y.o.   MRN: [id number]  HPI: Alex Hildreth. is a 58 y.o. male  Chief Complaint  Patient presents with  . Diabetes   DIABETES June 2020 A1C 8.3%, he was restarted on Metformin 500 MG BID and continues on Invokana.  He has been monitoring his blood sugars, has had a 6 pound weight loss with diet changes.  Has cut back on Cheerwine.  I continued to discuss with him the need to cut back on carbohydrates, as continue to see a lot of pasta and breads on list of meals he is eating.  Has learning disability and requires frequent repetition of instructions, continues to not understand concept of pasta and breads. He sees dietician on Monday. Hypoglycemic episodes:no Polydipsia/polyuria: no Visual disturbance: no Chest pain: no Paresthesias: no Glucose Monitoring: yes  Accucheck frequency: BID  Fasting glucose: 117 to 282, on average in 150 to 170  Post prandial:  Evening:  Before meals: Taking Insulin?: no  Long acting insulin:  Short acting insulin: Blood Pressure Monitoring: not checking Retinal Examination: Not up to date Foot Exam: Up to Date Diabetic Education: upcoming Pneumovax: Up to Date Influenza: Up to Date Aspirin: no   RASH Has been present 4-5 weeks to lower legs bilaterally.  Reports he sleeps on floor at his house with his dogs.  His wife sleeps in recliner.  Denies dogs having fleas, but reports he does not use flea medications.  Reports he scratches at the rash a lot and his wife worries he will get "blood poisoning".  Provided education on hygiene and fleas + bed bugs.  Lives in a trailer.  States he has not "seen any bugs".   Duration:  weeks  Location: legs   Itching: yes Burning: no Redness: yes Oozing: no Scaling: no Blisters: no Painful: no Fevers: no Change in detergents/soaps/personal care products: no Recent illness: no Recent travel:no History of same: no, although history of same is noted on problem list Context: fluctuating Alleviating factors: nothing Treatments attempted:nothing Shortness of breath: no  Throat/tongue swelling: no Myalgias/arthralgias: no  Relevant past medical, surgical, family and social history reviewed and updated as indicated. Interim medical history since our last visit reviewed. Allergies and medications reviewed and updated.  Review of Systems  Constitutional: Negative for activity change, diaphoresis, fatigue and fever.  Respiratory: Negative for cough, chest tightness, shortness of breath and wheezing.   Cardiovascular: Negative for chest pain, palpitations and leg swelling.  Gastrointestinal: Negative for abdominal distention, abdominal pain, constipation, diarrhea, nausea and vomiting.  Endocrine: Negative for cold intolerance, heat intolerance, polydipsia, polyphagia and polyuria.  Skin: Positive for rash.  Neurological: Negative for dizziness, syncope, weakness, light-headedness, numbness and headaches.    Per HPI unless specifically indicated above     Objective:    BP (!) 101/59 (BP Location: Right Arm, Patient Position: Sitting, Cuff Size: Normal)   Pulse (!) 59   Temp (!) 97.5 F (36.4 C) (Oral)   Ht 5' 4" (1.626 m)   Wt 175 lb (79.4 kg)   SpO2 97%   BMI 30.04 kg/m  Wt Readings from Last 3 Encounters:  10/01/18 175 lb (79.4 kg)  09/03/18 181 lb (82.1 kg)  08/04/18 184 lb (83.5 kg)    Physical Exam Vitals signs and nursing note reviewed.  Constitutional:      General: He is awake. He is not in acute distress.    Appearance: He is well-developed. He is not ill-appearing.  HENT:     Head: Normocephalic and atraumatic.     Right Ear: Hearing normal. No drainage.     Left  Ear: Hearing normal. No drainage.     Mouth/Throat:     Pharynx: Uvula midline.  Eyes:     General: Lids are normal.        Right eye: No discharge.        Left eye: No discharge.     Conjunctiva/sclera: Conjunctivae normal.     Pupils: Pupils are equal, round, and reactive to light.  Neck:     Musculoskeletal: Normal range of motion and neck supple.     Thyroid: No thyromegaly.     Vascular: No carotid bruit or JVD.     Trachea: Trachea normal.  Cardiovascular:     Rate and Rhythm: Normal rate and regular rhythm.     Heart sounds: Normal heart sounds, S1 normal and S2 normal. No murmur. No gallop.   Pulmonary:     Effort: Pulmonary effort is normal. No accessory muscle usage or respiratory distress.     Breath sounds: Normal breath sounds.  Abdominal:     General: Bowel sounds are normal.     Palpations: Abdomen is soft. There is no hepatomegaly or splenomegaly.     Tenderness: There is no abdominal tenderness.  Musculoskeletal: Normal range of motion.     Right lower leg: No edema.     Left lower leg: No edema.  Skin:    General: Skin is warm and dry.     Capillary Refill: Capillary refill takes less than 2 seconds.     Findings: Rash present. Rash is urticarial.     Comments: To bilateral lower legs.  Tiny red urticarial lesions, some in clusters and some individual, with mild erythema.  Different stages of healing, majority with crusting to exterior.  Linear scratch marks noted.  No blisters or vesicles.    Neurological:     Mental Status: He is alert and oriented to person, place, and time.     Deep Tendon Reflexes: Reflexes are normal and symmetric.  Psychiatric:        Mood and Affect: Mood normal.        Behavior: Behavior normal. Behavior is cooperative.        Thought Content: Thought content normal.        Judgment: Judgment normal.     Results for orders placed or performed in visit on 08/27/18  Comprehensive metabolic panel  Result Value Ref Range   Glucose  168 (H) 65 - 99 mg/dL   BUN 12 6 - 24 mg/dL   Creatinine, Ser 1.14 0.76 - 1.27 mg/dL   GFR calc non Af Amer 72 >59 mL/min/1.73   GFR calc Af Amer 83 >59 mL/min/1.73   BUN/Creatinine Ratio 11 9 - 20   Sodium 142 134 - 144 mmol/L   Potassium 4.1 3.5 - 5.2 mmol/L   Chloride 107 (H) 96 - 106 mmol/L   CO2 18 (L) 20 - 29 mmol/L   Calcium 9.2 8.7 - 10.2 mg/dL   Total Protein 7.2 6.0 - 8.5  g/dL   Albumin 4.6 3.8 - 4.9 g/dL   Globulin, Total 2.6 1.5 - 4.5 g/dL   Albumin/Globulin Ratio 1.8 1.2 - 2.2   Bilirubin Total 0.5 0.0 - 1.2 mg/dL   Alkaline Phosphatase 155 (H) 39 - 117 IU/L   AST 36 0 - 40 IU/L   ALT 40 0 - 44 IU/L  Lipid Panel w/o Chol/HDL Ratio  Result Value Ref Range   Cholesterol, Total 174 100 - 199 mg/dL   Triglycerides 420 (H) 0 - 149 mg/dL   HDL 34 (L) >39 mg/dL   VLDL Cholesterol Cal Comment 5 - 40 mg/dL   LDL Calculated Comment 0 - 99 mg/dL  HgB A1c  Result Value Ref Range   Hgb A1c MFr Bld 8.3 (H) 4.8 - 5.6 %   Est. average glucose Bld gHb Est-mCnc 192 mg/dL      Assessment & Plan:   Problem List Items Addressed This Visit      Endocrine   Diabetes mellitus type 2, controlled ([place])    Chronic, ongoing with some improvement in blood sugar readings noted with Metformin and weight loss present.  Continue current medication regimen.  Sees dietician on Monday, will continue to reeducate and reiterate need for focus on diet.  Return in 2 months for A1C and visit.        Musculoskeletal and Integument   Papular urticaria    Suspect related to possible fleas or bed bugs with sleeping on floor in trailer with dogs.  Clobetasol cream sent and recommend use of daily Claritin to assist with pruritus.  Recommend washing clothes daily at this time and good hygiene methods + avoiding sleeping on floor if possible.  Return for worsening or continued issues.         Time: 25 minutes, >50% spent counseling on diabetes and rash (learning disability present with patient)   Follow up plan: Return in about 2 months (around 12/02/2018) for T2DM, HTN/HLD (30 minute slot).

## 2018-10-01 NOTE — Assessment & Plan Note (Signed)
Suspect related to possible fleas or bed bugs with sleeping on floor in trailer with dogs.  Clobetasol cream sent and recommend use of daily Claritin to assist with pruritus.  Recommend washing clothes daily at this time and good hygiene methods + avoiding sleeping on floor if possible.  Return for worsening or continued issues.

## 2018-10-01 NOTE — Patient Instructions (Signed)
Carbohydrate Counting for Diabetes Mellitus, Adult  Carbohydrate counting is a method of keeping track of how many carbohydrates you eat. Eating carbohydrates naturally increases the amount of sugar (glucose) in the blood. Counting how many carbohydrates you eat helps keep your blood glucose within normal limits, which helps you manage your diabetes (diabetes mellitus). It is important to know how many carbohydrates you can safely have in each meal. This is different for every person. A diet and nutrition specialist (registered dietitian) can help you make a meal plan and calculate how many carbohydrates you should have at each meal and snack. Carbohydrates are found in the following foods:  Grains, such as breads and cereals.  Dried beans and soy products.  Starchy vegetables, such as potatoes, peas, and corn.  Fruit and fruit juices.  Milk and yogurt.  Sweets and snack foods, such as cake, cookies, candy, chips, and soft drinks. How do I count carbohydrates? There are two ways to count carbohydrates in food. You can use either of the methods or a combination of both. Reading "Nutrition Facts" on packaged food The "Nutrition Facts" list is included on the labels of almost all packaged foods and beverages in the [place] It includes:  The serving size.  Information about nutrients in each serving, including the grams (g) of carbohydrate per serving. To use the "Nutrition Facts":  Decide how many servings you will have.  Multiply the number of servings by the number of carbohydrates per serving.  The resulting number is the total amount of carbohydrates that you will be having. Learning standard serving sizes of other foods When you eat carbohydrate foods that are not packaged or do not include "Nutrition Facts" on the label, you need to measure the servings in order to count the amount of carbohydrates:  Measure the foods that you will eat with a food scale or measuring cup, if needed.   Decide how many standard-size servings you will eat.  Multiply the number of servings by 15. Most carbohydrate-rich foods have about 15 g of carbohydrates per serving. ? For example, if you eat 8 oz (170 g) of strawberries, you will have eaten 2 servings and 30 g of carbohydrates (2 servings x 15 g = 30 g).  For foods that have more than one food mixed, such as soups and casseroles, you must count the carbohydrates in each food that is included. The following list contains standard serving sizes of common carbohydrate-rich foods. Each of these servings has about 15 g of carbohydrates:   hamburger bun or  English muffin.   oz (15 mL) syrup.   oz (14 g) jelly.  1 slice of bread.  1 six-inch tortilla.  3 oz (85 g) cooked rice or pasta.  4 oz (113 g) cooked dried beans.  4 oz (113 g) starchy vegetable, such as peas, corn, or potatoes.  4 oz (113 g) hot cereal.  4 oz (113 g) mashed potatoes or  of a large baked potato.  4 oz (113 g) canned or frozen fruit.  4 oz (120 mL) fruit juice.  4-6 crackers.  6 chicken nuggets.  6 oz (170 g) unsweetened dry cereal.  6 oz (170 g) plain fat-free yogurt or yogurt sweetened with artificial sweeteners.  8 oz (240 mL) milk.  8 oz (170 g) fresh fruit or one small piece of fruit.  24 oz (680 g) popped popcorn. Example of carbohydrate counting Sample meal  3 oz (85 g) chicken breast.  6 oz (170 g)  brown rice.  4 oz (113 g) corn.  8 oz (240 mL) milk.  8 oz (170 g) strawberries with sugar-free whipped topping. Carbohydrate calculation 1. Identify the foods that contain carbohydrates: ? Rice. ? Corn. ? Milk. ? Strawberries. 2. Calculate how many servings you have of each food: ? 2 servings rice. ? 1 serving corn. ? 1 serving milk. ? 1 serving strawberries. 3. Multiply each number of servings by 15 g: ? 2 servings rice x 15 g = 30 g. ? 1 serving corn x 15 g = 15 g. ? 1 serving milk x 15 g = 15 g. ? 1 serving  strawberries x 15 g = 15 g. 4. Add together all of the amounts to find the total grams of carbohydrates eaten: ? 30 g + 15 g + 15 g + 15 g = 75 g of carbohydrates total. Summary  Carbohydrate counting is a method of keeping track of how many carbohydrates you eat.  Eating carbohydrates naturally increases the amount of sugar (glucose) in the blood.  Counting how many carbohydrates you eat helps keep your blood glucose within normal limits, which helps you manage your diabetes.  A diet and nutrition specialist (registered dietitian) can help you make a meal plan and calculate how many carbohydrates you should have at each meal and snack. This information is not intended to replace advice given to you by your health care provider. Make sure you discuss any questions you have with your health care provider. Document Released: 03/05/2005 Document Revised: 09/27/2016 Document Reviewed: 08/17/2015 Elsevier Patient Education  2020 [employer].

## 2018-10-01 NOTE — Assessment & Plan Note (Signed)
Chronic, ongoing with some improvement in blood sugar readings noted with Metformin and weight loss present.  Continue current medication regimen.  Sees dietician on Monday, will continue to reeducate and reiterate need for focus on diet.  Return in 2 months for A1C and visit.

## 2018-10-02 ENCOUNTER — Ambulatory Visit: Payer: Self-pay | Admitting: *Deleted

## 2018-10-02 ENCOUNTER — Telehealth: Payer: Self-pay | Admitting: Dietician

## 2018-10-02 ENCOUNTER — Encounter: Payer: Self-pay | Admitting: Dietician

## 2018-10-02 DIAGNOSIS — E119 Type 2 diabetes mellitus without complications: Secondary | ICD-10-CM

## 2018-10-02 DIAGNOSIS — E700 Classical phenylketonuria: Secondary | ICD-10-CM

## 2018-10-02 DIAGNOSIS — E701 Other hyperphenylalaninemias: Secondary | ICD-10-CM

## 2018-10-02 NOTE — Telephone Encounter (Signed)
Called and spoke with Mr. Peerson wife regarding Mr. Heeney Monday's appointment with the Nutrition [facility] Diabetes Education [facility]. 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 She states that he wears an ankle bracelet  and cannot leave the county to meet with an RD at a Metabolic/Genetic Clinic for adults such as at [facility]. Told her that I would meet with Mr. Brawley for an initial nutrition assessment and would then follow-up with [name] Minor, RN Case manager regarding the best next step. Mrs. Morriss stated she would like for Mr. Nanna to keep Monday's appointment.

## 2018-10-02 NOTE — Chronic Care Management (AMB) (Signed)
  Chronic Care Management   Follow Up Note   10/02/2018 Name: [name]. MRN: [id number] DOB: 1960/07/03  Referred by: [name], NP Reason for referral : No chief complaint on file.   [name]. is a 58 y.o. year old male who is a primary care patient of Cannady, Barbaraann Faster, NP. The CCM team was consulted for assistance with chronic disease management and care coordination needs.    Review of patient status, including review of consultants reports, relevant laboratory and other test results, and collaboration with appropriate care team members and the patient's provider was performed as part of comprehensive patient evaluation and provision of chronic care management services.    Goals Addressed            This Visit's Progress   . 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 Film/video editor.  . Literacy barriers . Cognitive Deficits  Nurse Case Manager Clinical Goal(s):  [name] Over the next 90 days, patient will work with [facility]  to address needs related to learning what patient can eat on Diabetic and PKU diet . Over the next 90 days, patient will work with Administrator, sports (community agency) to learn what he can eat on a diabetic and PKU diet.  Interventions:  . Provided education to patient re: Diabetes . Discussed plans with patient for ongoing care management follow up and provided patient with direct contact information for care management team . Spouse states he is really having a hard time seeing because he needs cataract surgery but it is 195$ per eye and they just can not afford it. Plan to talk with CCM LCSW about this.  [name] Spoke with [employer], related to patient's transportation issues she plans to see him and work with him this Monday.  [name] Spoke with patient's spouse requested she bring any resources she has to the appt with the dietician. [name]  Spouse states they receive most of their foods through food pantries related to their low income.  . Gave spouse the number to the [employer] - Coord[employer] nationally by the [employer] providing free cataract surgery to people of all ages who have no other means to pay. Free eye examinations and surgeries are scheduled annually on one day, usually in May.  Telephone: [phone]  . Emailed [employer] rep Haylee to inquire if she knew how to obtain the special formula for PKU. We are collaborating on ways to obtain this at low/no cost. .   Patient Self Care Activities:  . Currently UNABLE TO independently manage complicated diet restrictions  Please see past updates related to this goal by clicking on the "Past Updates" button in the selected goal          The care management team will reach out to the patient again over the next 30 days.  The patient has been provided with contact information for the care management team and has been advised to call with any health related questions or concerns.    [name] Catilyn Boggus RN, BSN Nurse Case Editor, commissioning Family Practice/[facility] Care [facility]  [phone]) Business Mobile

## 2018-10-02 NOTE — Progress Notes (Signed)
Called [name] Minor, RN case Freight forwarder for Mr. Burnley. Explained that I followed through regarding whether a [facility] RD in [place] has experience with phenylketonuria and none have this experience/expertise. Explained that I called and talked with Mr. Iannello wife and she states he cannot meet with an RD at a Metabolic/Genetic clinic such as at [facility] because he wears an ankle bracelet and cannot leave the county. She states that Mr. Matus has never met with an RD and that he does not follow a PKU diet. She stated that he has not had any PKU protein powder for 6+ years.  Told Ms. Minor that I would meet with Mr. Cobbins for an initial assessment 10/06/18 and then consult with her  regarding best next step. Asked her to contact her office's [employer] representative regarding a phenylalanine free protein powder for patient.

## 2018-10-06 ENCOUNTER — Encounter: Payer: Self-pay | Admitting: Dietician

## 2018-10-06 ENCOUNTER — Encounter: Payer: Medicare Other | Attending: Nurse Practitioner | Admitting: Dietician

## 2018-10-06 ENCOUNTER — Other Ambulatory Visit: Payer: Self-pay

## 2018-10-06 VITALS — Ht 64.0 in | Wt 177.0 lb

## 2018-10-06 DIAGNOSIS — E700 Classical phenylketonuria: Secondary | ICD-10-CM | POA: Diagnosis not present

## 2018-10-06 DIAGNOSIS — E1169 Type 2 diabetes mellitus with other specified complication: Secondary | ICD-10-CM

## 2018-10-06 DIAGNOSIS — E119 Type 2 diabetes mellitus without complications: Secondary | ICD-10-CM | POA: Insufficient documentation

## 2018-10-06 DIAGNOSIS — E701 Other hyperphenylalaninemias: Secondary | ICD-10-CM

## 2018-10-06 NOTE — Progress Notes (Signed)
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 Auditory  Current weight: 177lbs Height: 64 in Medications, supplements: see list  Progress and evaluation:  Patient accompanied by his wife in for initial medical nutrition therapy appointment. He brought a glucose record which indicates FBG's in 150's-180's range. HgA1c 8.3 (6/'20).  Patient states he has tried to eat less and drink more water and reports 4-5 lb weight loss. His wife reports that it has been 6-8+ years since he has had any PKU protein powder. He does not follow a PKU diet. He often skips breakfast and when he does eat, he eats 2 eggs, sausage, water His lunch and dinner meals contain protein foods such as cheese dogs, [place] or ham sandwiches or a spicy chicken sandwich. His diet includes cheese but no milk. He gave example of hamburger helper as an evening meal. His wife is in a wheel chair and patient prepares most of the meals; she is teaching him how to cook simple meals. He loves vegetables such as cabbage, spinach, squash etc. He likes a limited variety of fruits and listed apples, watermelon, canned peaches, and cantaloupe as fruits he would eat. He loves sweet tea and said he tried tea with Splenda and it was not sweet enough. Is willing to try some carbonated flavored waters that do not contain aspartame. He states he does not know if he can drink the powdered PKU supplements like he drank years ago due to disliking the flavor. He does not drink alcoholic beverages.   Physical activity: no structured exercise  Nutrition Care Education: PKU  diet: Explained to wife and patient the basis for the PKU diet and why it is recommended for adults with PKU; not just in childhood. Instructed on dietary principles of a PKU diet and explained that if all protein group foods are eliminated as recommended, a PKU protein supplement is necessary in order to meet protein needs. Explained that the RN case manager is checking with [employer] rep regarding the special PKU formula at a reduced/no cost. Discussed ways presently to decrease protein foods until formula is available. Also explained, that it is recommended to meet with an RD specializing in Metabolic/genetic disorders but patient's wife stated again that due to lack of transportation and fact that patient wears an ankle bracelet and can't leave [facility], going to a clinic that specializes in genetic/metabolic disorders is not feasible. Diabetes:  Used a simple meal planning guide to show how to balance carbohydrate foods with non-starchy vegetables and healthy fats. Included small porions of protein foods to be included until PKU formula is available. Stressed importance of spreading food out over 3 meals and 1-2 small snacks to help avoid large portions.  Nutritional Diagnosis:  NB-1.1 Food and nutrition-related knowledge deficit As related to PKU diet.  As evidenced by patient including protein foods on a daily basis.  Intervention:  After instructing on dietary guidelines for PKU and diabetes [name] stated that he would be willing: To try flavored carbonated beverages that have 0 calories and no sweeteners. Try making tea with Stevia. If making tea with sugar, use half amount of sugar that normally use. Eat  3 meals meals spaced 4-5 hours apart. Use guide given for PKU diet and also simple meal planning guide. Patient stated that even with PKU protein supplement, he will not eliminate meats but might can decrease. Limit to 1 egg at breakfast rather than 2 eggs and add 1  slice toast and fruit or eat a breakfast of 1 cups grits, fruit. Limit meat to 1-2 oz at lunch and 2-3 oz. dinner with goal of less meat once PKU formula is introduced.  Education Materials given:  . General diet guidelines for Diabetes . Dietary guidelines for PKU . Food lists/ Planning A Balanced Meal (Simple Version) . Sample meal pattern/ menus . Goals/ instructions  Learner/ who was taught:  . Patient  . Spouse/ partner: [name]  Level of understanding: . Partial understanding; needs review/ practice  Demonstrated degree of understanding via:   Teach back by patient's wife Learning barriers: . Cognitive limitations of patient but not wife Willingness to learn/ readiness for change: . Hesitance, contemplating change  Monitoring and Evaluation:  Dietary intake, exercise, and body weight  No follow-up scheduled. Will follow-up with [name], RN case manager regarding status of PKU protein supplement.

## 2018-10-06 NOTE — Patient Instructions (Signed)
After instructing on dietary guidelines for PKU and diabetes Mr. Patella stated that he would be willing: To try flavored carbonated beverages that have 0 calories and no sweeteners. Try making tea with Stevia. If making tea with sugar, use half amount of sugar that normally use. Eat 3 meals meals spaced 4-5 hours apart. Use guide given for PKU diet and also simple meal planning guide. Patient stated he will not eliminate meats but might can decrease. Limit to 1 egg at breakfast rather than 2 eggs and add 1 slice toast and fruit or eat a breakfast of 1 cups grits, fruit. Limit meat to 1-2 oz at lunch and 2-3 oz. dinner with goal of less meat once PKU formula is introduced.

## 2018-10-15 ENCOUNTER — Ambulatory Visit: Payer: Self-pay | Admitting: *Deleted

## 2018-10-15 DIAGNOSIS — E701 Other hyperphenylalaninemias: Secondary | ICD-10-CM

## 2018-10-15 DIAGNOSIS — E1140 Type 2 diabetes mellitus with diabetic neuropathy, unspecified: Secondary | ICD-10-CM

## 2018-10-15 DIAGNOSIS — E700 Classical phenylketonuria: Secondary | ICD-10-CM

## 2018-10-15 NOTE — Chronic Care Management (AMB) (Signed)
Chronic Care Management   Follow Up Note   10/15/2018 Name: [name]. MRN: [id number] DOB: [birth year]  Referred by: [name], NP Reason for referral : Chronic Care Management (DM2) and Care Coordination (PKU/Dietician )   [name]. is a 58 y.o. year old male who is a primary care patient of Cannady, Barbaraann Faster, NP. The CCM team was consulted for assistance with chronic disease management and care coordination needs.    Review of patient status, including review of consultants reports, relevant laboratory and other test results, and collaboration with appropriate care team members and the patient's provider was performed as part of comprehensive patient evaluation and provision of chronic care management services.    Wt Readings from Last 3 Encounters:  10/06/18 177 lb (80.3 kg)  10/01/18 175 lb (79.4 kg)  09/03/18 181 lb (82.1 kg)     Goals Addressed            This Visit's Progress   . 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 Film/video editor.  . Literacy barriers . Cognitive Deficits  Nurse Case Manager Clinical Goal(s):  [name] Over the next 90 days, patient will work with [name]  to address needs related to learning what patient can eat on Diabetic and PKU diet . Over the next 90 days, patient will work with Administrator, sports (community agency) to learn what he can eat on a diabetic and PKU diet.  Interventions:  . Provided education to patient re: Diabetes . Discussed plans with patient for ongoing care management follow up and provided patient with direct contact information for care management team . Collaboration with Teressa Lower at [facility] vis email and VM trying to figure out the best medical food option with patient having diabetes and PKU.  [name] Reviewed with spouse patient can have diet drinks that do not contain phenylalanine. Discussed with  spouse information dietician was able to provide them.  [name] Spouse states they receive most of their foods through food pantries related to their low income.  . Gave spouse the number to the [employer] - Coordinated nationally by the [employer] providing free cataract surgery to people of all ages who have no other means to pay. Free eye examinations and surgeries are scheduled annually on one day, usually in May.  Telephone: [phone] - Spouse stating patient is going to to be able to take advantage of this resource.  . Collaboration with [employer] nutrition rep. She provided information on their medical food source for PKU and the financial resource information to receive the formula.  [name] Researched PKU resources for other medical food options and financial assistance programs for these:  . http://www.landry.com/ . Spouse states patient is doing well with sugars and eating better she has notices he has dropped some weight.   Patient Self Care Activities:  . Currently UNABLE TO independently manage complicated diet restrictions  Please see past updates related to this goal by clicking on the "Past Updates" button in the selected goal          The care management team will reach out to the patient again over the next 30 days.  The patient has been provided with contact information for the care management team and has been advised to call with any health related questions or concerns.    Namya Voges RN,  BSN Nurse Case Editor, commissioning Family Practice/[facility] Care Management  [phone]) Business Mobile

## 2018-10-16 ENCOUNTER — Telehealth: Payer: Self-pay | Admitting: Dietician

## 2018-10-16 NOTE — Telephone Encounter (Signed)
Called and spoke with patient's wife. She states that Mr. Searfoss has increased his water intake and decreased his soda intake. He is also eating smaller meat portions and has increased his vegetable intake. He is drinking tea with Splenda. Told Ms. Jentz that I left phone message with [name], RN case manager regarding application for PKU supplement. Will follow-up with Ms. Byard by phone 10/21/18.

## 2018-10-18 NOTE — Patient Instructions (Signed)
Thank you allowing the Chronic Care Management Team to be a part of your care! It was a pleasure speaking with you today!   CCM (Chronic Care Management) Team   Avah Bashor RN, BSN Nurse Care Coordinator  [phone]  [facility] PharmD  Clinical Pharmacist  [phone]  Eula Fried LCSW Clinical Social Worker [phone]  Goals Addressed            This Visit's Progress   . 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 Film/video editor.  . Literacy barriers . Cognitive Deficits  Nurse Case Manager Clinical Goal(s):  [name] Over the next 90 days, patient will work with [facility]  to address needs related to learning what patient can eat on Diabetic and PKU diet . Over the next 90 days, patient will work with Administrator, sports (community agency) to learn what he can eat on a diabetic and PKU diet.  Interventions:  . Provided education to patient re: Diabetes . Discussed plans with patient for ongoing care management follow up and provided patient with direct contact information for care management team . Collaboration with Teressa Lower at [facility] vis email and VM trying to figure out the best medical food option with patient having diabetes and PKU.  [name] Reviewed with spouse patient can have diet drinks that do not contain phenylalanine. Discussed with spouse information dietician was able to provide them. Examples: Zevia Cola . Spouse states they receive most of their foods through food pantries related to their low income.  . Gave spouse the number to the [employer] - Coordinated nationally by the [employer] providing free cataract surgery to people of all ages who have no other means to pay. Free eye examinations and surgeries are scheduled annually on one day, usually in May.  Telephone: [phone] - Spouse stating patient is going to to be able to  take advantage of this resource.  . Collaboration with [employer] nutrition rep. She provided information on their medical food source for PKU and the financial resource information to receive the formula.  [name] Researched PKU resources for other medical food options and financial assistance programs for these:  . http://www.landry.com/ . Spouse states patient is doing well with sugars and eating better she has notices he has dropped some weight.   Patient Self Care Activities:  . Currently UNABLE TO independently manage complicated diet restrictions  Please see past updates related to this goal by clicking on the "Past Updates" button in the selected goal         The patient verbalized understanding of instructions provided today and declined a print copy of patient instruction materials.   The patient has been provided with contact information for the care management team and has been advised to call with any health related questions or concerns.

## 2018-10-20 ENCOUNTER — Encounter: Payer: Self-pay | Admitting: Dietician

## 2018-10-20 ENCOUNTER — Ambulatory Visit: Payer: Self-pay | Admitting: *Deleted

## 2018-10-20 DIAGNOSIS — E1140 Type 2 diabetes mellitus with diabetic neuropathy, unspecified: Secondary | ICD-10-CM

## 2018-10-20 DIAGNOSIS — E700 Classical phenylketonuria: Secondary | ICD-10-CM

## 2018-10-20 DIAGNOSIS — E701 Other hyperphenylalaninemias: Secondary | ICD-10-CM

## 2018-10-20 NOTE — Chronic Care Management (AMB) (Signed)
  Chronic Care Management   Follow Up Note   10/20/2018 Name: [name]. MRN: [id number] DOB: 1961/01/13  Referred by: [name], NP Reason for referral : Care Coordination (Dietician )   [name]. is a 58 y.o. year old male who is a primary care patient of Cannady, Barbaraann Faster, NP. The CCM team was consulted for assistance with chronic disease management and care coordination needs.    Review of patient status, including review of consultants reports, relevant laboratory and other test results, and collaboration with appropriate care team members and the patient's provider was performed as part of comprehensive patient evaluation and provision of chronic care management services.    Goals Addressed            This Visit's Progress   . 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 Film/video editor.  . Literacy barriers . Cognitive Deficits  Nurse Case Manager Clinical Goal(s):  [name] Over the next 90 days, patient will work with [facility]  to address needs related to learning what patient can eat on Diabetic and PKU diet . Over the next 90 days, patient will work with Administrator, sports (community agency) to learn what he can eat on a diabetic and PKU diet.  Interventions:  . Collaborated with [employer] Rep regarding obtaining a sample of [employer] product Phenex-2, Rep states she will reach out to her colleagues to try to obtain sample of product for patient.  . Collaboration with Teressa Lower at [facility] related to best formula/medical protein source  for patient. Plan implemented for me to try to obtain a sample of Phenex-2 to see if patient can tolerate. Dietician plans to  educate patient about this when we can obtain the sample.  . Plan to assist patient with financial resource for formula once we know he can tolerate it. Patient Self Care Activities:  . Currently UNABLE TO  independently manage complicated diet restrictions  Please see past updates related to this goal by clicking on the "Past Updates" button in the selected goal          The patient has been provided with contact information for the care management team and has been advised to call with any health related questions or concerns.    [name] Gara Kincade RN, BSN Nurse Case Editor, commissioning Family Practice/[facility] Care Management  [phone]) Business Mobile

## 2018-10-20 NOTE — Progress Notes (Unsigned)
Carroll Kinds, RN, Case Manager and suggested that she ask her [employer] representative for a sample of Phenex 2, a PKU supplement in order for patient to try it and see if he is willing to substitute it for some of the meat/dairy sources in his diet. Suggested to do this before ordering more.

## 2018-10-21 ENCOUNTER — Telehealth: Payer: Self-pay | Admitting: Dietician

## 2018-10-21 NOTE — Telephone Encounter (Signed)
Called and spoke with patient's wife to let her know that [name], Therapist, sports, case manager will request a sample of PKU supplement from [facility]. [name] states that [name] has not used a PKU supplement for 8 years or more but states that she feels sure he will accept it if she adds it to applesauce for example as she once did. She states he continues to eat smaller portions of meat. She gave example of last evening when for dinner, he ate 1 piece of chicken breast instead of 2.  She states he has significantly decreased his soda intake and is adding sugar-free flavored powder pkts  (non-aspartame) to water.  She reports that his blood glucose readings have improved and most are in 120's to 140's range.

## 2018-10-22 ENCOUNTER — Telehealth: Payer: Self-pay

## 2018-10-22 ENCOUNTER — Ambulatory Visit: Payer: Medicare Other | Admitting: Pharmacist

## 2018-10-22 DIAGNOSIS — E1140 Type 2 diabetes mellitus with diabetic neuropathy, unspecified: Secondary | ICD-10-CM

## 2018-10-22 NOTE — Patient Instructions (Signed)
Visit Information  Goals Addressed            This Visit's Progress     Patient Stated   . "I want to work on my diabetes" (pt-stated)       Current Barriers:  . Diabetes: uncontrolled; most recent A1c 8.3% . Current antihyperglycemic regimen: metformin 500 mg BID, Invokana 300 mg daily  o Wife reports that they have been checking sugars daily, and really committing to making changes.  o Does note some concerns affording medications between him and his wife. Wife reports that they live completely off of his disability income.  . Current meal patterns: has been having telephonic appointments with Dietician regarding diabetes and PKU diet; has cut back on meats and sodas, and has been using flavored waters that do not contain phenylalanine  . Current blood glucose readings: Generally in the 100s . Cardiovascular risk reduction: o Current hypertensive regimen: lisinopril 20 mg daily o Current hyperlipidemia regimen: simvastatin 40 mg daily; last LDL unable to be calculated d/t elevated triglycerides.   Pharmacist Clinical Goal(s):  [name] Over the next 90 days, patient with work with PharmD and primary care provider to address optimized medication management  Interventions: . Comprehensive medication review performed . Congratulated patient and his wife on their commitment to medication, dietary, and lifestyle changes . Per income review, appears patient would qualify for Full Medicare Extra Help. With his/his wife's permission and input, collaboratively completed application today. Decision should come in the mail in 4-6 weeks; they verbalized understanding  Patient Self Care Activities:  . Patient will check blood glucose daily , document, and provide at future appointments . Patient will take medications as prescribed . Patient will report any questions or concerns to provider   Please see past updates related to this goal by clicking on the "Past Updates" button in the selected goal          The patient verbalized understanding of instructions provided today and declined a print copy of patient instruction materials.   Plan:  - Will outreach in 4-6 weeks for continued medication management support  Catie Darnelle Maffucci, PharmD Clinical Pharmacist [place] [phone]

## 2018-10-22 NOTE — Chronic Care Management (AMB) (Signed)
  Chronic Care Management   Follow Up Note   10/22/2018 Name: [name]. MRN: [id number] DOB: January 28, 1961  Referred by: [name], NP Reason for referral : Chronic Care Management (Medication Management)   [name]. is a 58 y.o. year old male who is a primary care patient of Cannady, Barbaraann Faster, NP. The CCM team was consulted for assistance with chronic disease management and care coordination needs.    Spoke with patient/his wife today regarding medication management concerns.   Review of patient status, including review of consultants reports, relevant laboratory and other test results, and collaboration with appropriate care team members and the patient's provider was performed as part of comprehensive patient evaluation and provision of chronic care management services.    Goals Addressed            This Visit's Progress     Patient Stated   . "I want to work on my diabetes" (pt-stated)       Current Barriers:  . Diabetes: uncontrolled; most recent A1c 8.3% . Current antihyperglycemic regimen: metformin 500 mg BID, Invokana 300 mg daily  o Wife reports that they have been checking sugars daily, and really committing to making changes.  o Does note some concerns affording medications between him and his wife. Wife reports that they live completely off of his disability income.  . Current meal patterns: has been having telephonic appointments with Dietician regarding diabetes and PKU diet; has cut back on meats and sodas, and has been using flavored waters that do not contain phenylalanine  . Current blood glucose readings: Generally in the 100s . Cardiovascular risk reduction: o Current hypertensive regimen: lisinopril 20 mg daily o Current hyperlipidemia regimen: simvastatin 40 mg daily; last LDL unable to be calculated d/t elevated triglycerides.   Pharmacist Clinical Goal(s):  [name] Over the next 90 days, patient with work with PharmD and primary  care provider to address optimized medication management  Interventions: . Comprehensive medication review performed . Congratulated patient and his wife on their commitment to medication, dietary, and lifestyle changes . Per income review, appears patient would qualify for Full Medicare Extra Help. With his/his wife's permission and input, collaboratively completed application today. Decision should come in the mail in 4-6 weeks; they verbalized understanding  Patient Self Care Activities:  . Patient will check blood glucose daily , document, and provide at future appointments . Patient will take medications as prescribed . Patient will report any questions or concerns to provider   Please see past updates related to this goal by clicking on the "Past Updates" button in the selected goal         Plan:  - Will outreach in 4-6 weeks for continued medication management support  Catie Darnelle Maffucci, PharmD Clinical Pharmacist [place] [phone]

## 2018-10-23 ENCOUNTER — Ambulatory Visit: Payer: Self-pay | Admitting: *Deleted

## 2018-10-23 DIAGNOSIS — E1140 Type 2 diabetes mellitus with diabetic neuropathy, unspecified: Secondary | ICD-10-CM

## 2018-10-23 DIAGNOSIS — E700 Classical phenylketonuria: Secondary | ICD-10-CM

## 2018-10-23 DIAGNOSIS — E701 Other hyperphenylalaninemias: Secondary | ICD-10-CM

## 2018-10-23 NOTE — Chronic Care Management (AMB) (Signed)
  Chronic Care Management   Follow Up Note   10/23/2018 Name: [name]. MRN: [id number] DOB: [birth year]  Referred by: [name], NP Reason for referral : No chief complaint on file.   [name]. is a 58 y.o. year old male who is a primary care patient of Cannady, Barbaraann Faster, NP. The CCM team was consulted for assistance with chronic disease management and care coordination needs.    Review of patient status, including review of consultants reports, relevant laboratory and other test results, and collaboration with appropriate care team members and the patient's provider was performed as part of comprehensive patient evaluation and provision of chronic care management services.    Goals Addressed            This Visit's Progress   . 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 Film/video editor.  . Literacy barriers . Cognitive Deficits  Nurse Case Manager Clinical Goal(s):  [name] Over the next 90 days, patient will work with [facility]  to address needs related to learning what patient can eat on Diabetic and PKU diet . Over the next 90 days, patient will work with Administrator, sports (community agency) to learn what he can eat on a diabetic and PKU diet.  Interventions:  . Discussed plans with patient for ongoing care management follow up and provided patient with direct contact information for care management team . Advised patient, providing education and rationale, to check cbg qday  and record, calling PCP  for findings outside established parameters.   [name] Spouse reports patient's daily sugars to be 138, 140, 116 for the last few days. [name] Spouse reports patient has been eating less meat and trying to follow the dieticians recommendations by reducing sodas and using flavored water instead.   Patient Self Care Activities:  . Currently UNABLE TO independently manage  complicated diet restrictions  Please see past updates related to this goal by clicking on the "Past Updates" button in the selected goal          The care management team will reach out to the patient again over the next 30 days.  The patient has been provided with contact information for the care management team and has been advised to call with any health related questions or concerns.   [name] Sully Manzi RN, BSN Nurse Case Editor, commissioning [facility]  [phone]) Business Mobile

## 2018-10-24 NOTE — Patient Instructions (Signed)
Thank you allowing the Chronic Care Management Team to be a part of your care! It was a pleasure speaking with you today!  CCM (Chronic Care Management) Team   Lukasz Rogus RN, BSN Nurse Care Coordinator  [phone]  Catie [facility] PharmD  Clinical Pharmacist  [phone]  Eula Fried LCSW Clinical Social Worker [phone]  Goals Addressed            This Visit's Progress   . 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 Film/video editor.  . Literacy barriers . Cognitive Deficits  Nurse Case Manager Clinical Goal(s):  [name] Over the next 90 days, patient will work with [facility]  to address needs related to learning what patient can eat on Diabetic and PKU diet . Over the next 90 days, patient will work with Administrator, sports (community agency) to learn what he can eat on a diabetic and PKU diet.  Interventions:  . Discussed plans with patient for ongoing care management follow up and provided patient with direct contact information for care management team . Advised patient, providing education and rationale, to check cbg qday  and record, calling PCP  for findings outside established parameters.   [name] Spouse reports patient's daily sugars to be 138, 140, 116 for the last few days. [name] Spouse reports patient has been eating less meat and trying to follow the dieticians recommendations by reducing sodas and using flavored water instead.   Patient Self Care Activities:  . Currently UNABLE TO independently manage complicated diet restrictions  Please see past updates related to this goal by clicking on the "Past Updates" button in the selected goal         The patient verbalized understanding of instructions provided today and declined a print copy of patient instruction materials.   The patient has been provided with contact information for the care management team and has been advised to  call with any health related questions or concerns.

## 2018-10-29 ENCOUNTER — Other Ambulatory Visit: Payer: Self-pay | Admitting: Nurse Practitioner

## 2018-10-31 ENCOUNTER — Ambulatory Visit
Admission: RE | Admit: 2018-10-31 | Discharge: 2018-10-31 | Disposition: A | Discharge status: Home / Self Care | Payer: Medicare Other | Source: Ambulatory Visit | Attending: Nurse Practitioner | Admitting: Nurse Practitioner

## 2018-10-31 ENCOUNTER — Other Ambulatory Visit: Payer: Self-pay

## 2018-10-31 ENCOUNTER — Ambulatory Visit (INDEPENDENT_AMBULATORY_CARE_PROVIDER_SITE_OTHER): Payer: Medicare Other | Admitting: Nurse Practitioner

## 2018-10-31 ENCOUNTER — Ambulatory Visit: Payer: Medicare Other

## 2018-10-31 ENCOUNTER — Encounter: Payer: Self-pay | Admitting: Nurse Practitioner

## 2018-10-31 ENCOUNTER — Ambulatory Visit (INDEPENDENT_AMBULATORY_CARE_PROVIDER_SITE_OTHER): Payer: Medicare Other | Admitting: *Deleted

## 2018-10-31 VITALS — BP 112/73 | HR 55 | Temp 97.5°F | Ht 64.0 in | Wt 174.0 lb

## 2018-10-31 DIAGNOSIS — G8929 Other chronic pain: Secondary | ICD-10-CM | POA: Insufficient documentation

## 2018-10-31 DIAGNOSIS — M25562 Pain in left knee: Secondary | ICD-10-CM | POA: Insufficient documentation

## 2018-10-31 DIAGNOSIS — M25561 Pain in right knee: Secondary | ICD-10-CM | POA: Insufficient documentation

## 2018-10-31 DIAGNOSIS — E1140 Type 2 diabetes mellitus with diabetic neuropathy, unspecified: Secondary | ICD-10-CM | POA: Diagnosis not present

## 2018-10-31 MED ORDER — MELOXICAM 15 MG PO TABS: 15.0000 mg | ORAL_TABLET | Freq: Every day | ORAL | 0 refills | Status: DC

## 2018-10-31 NOTE — Addendum Note (Signed)
Addended by: Marnee Guarneri T on: 10/31/2018 01:12 PM   Modules accepted: Orders

## 2018-10-31 NOTE — Patient Instructions (Signed)
Thank you allowing the Chronic Care Management Team to be a part of your care! It was a pleasure speaking with you today!  CCM (Chronic Care Management) Team   Derry Arbogast RN, BSN Nurse Care Coordinator  [phone]  Catie [facility] PharmD  Clinical Pharmacist  [phone]  Eula Fried LCSW Clinical Social Worker [phone]  Goals Addressed            This Visit's Progress   . 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 Film/video editor.  . Literacy barriers . Cognitive Deficits  Nurse Case Manager Clinical Goal(s):  [name] Over the next 90 days, patient will work with [facility]  to address needs related to learning what patient can eat on Diabetic and PKU diet . Over the next 90 days, patient will work with Administrator, sports (community agency) to learn what he can eat on a diabetic and PKU diet.  Interventions:  . Discussed plans with patient for ongoing care management follow up and provided patient with direct contact information for care management team . Advised patient, providing education and rationale, to check cbg qday  and record, calling PCP  for findings outside established parameters.   [name] Spouse reports patient's daily sugars to be 138, 140, 116 for the last few days. [name] Spouse reports patient has been eating less meat and trying to follow the dieticians recommendations by reducing sodas and using flavored water instead. [name] Spouse called to let me know she had received a letter from medicare concerning patient's special assistance application.  . Collaboration with CCM pharmacist via secure message while on the phone with spouse, requests letter be brought to the PCP office for her to get a copy. Spouse verbalized understanding.    Patient Self Care Activities:  . Currently UNABLE TO independently manage complicated diet restrictions  Please see past updates related to this goal  by clicking on the "Past Updates" button in the selected goal         The patient verbalized understanding of instructions provided today and declined a print copy of patient instruction materials.   The patient has been provided with contact information for the care management team and has been advised to call with any health related questions or concerns.

## 2018-10-31 NOTE — Patient Instructions (Signed)
Xrays of knees: [address], [place], [place] [address]  ----- [phone]  Referral to the knee doctor (orthopedic doctor)  Will start a little Meloxicam to help you with pain.   Acute Knee Pain, Adult Many things can cause knee pain. Sometimes, knee pain is sudden (acute) and may be caused by damage, swelling, or irritation of the muscles and tissues that support your knee. The pain often goes away on its own with time and rest. If the pain does not go away, tests may be done to find out what is causing the pain. Follow these instructions at home: Pay attention to any changes in your symptoms. Take these actions to relieve your pain. If you have a knee sleeve or brace:   Wear the sleeve or brace as told by your doctor. Remove it only as told by your doctor.  Loosen the sleeve or brace if your toes: ? Tingle. ? Become numb. ? Turn cold and blue.  Keep the sleeve or brace clean.  If the sleeve or brace is not waterproof: ? Do not let it get wet. ? Cover it with a watertight covering when you take a bath or shower. Activity  Rest your knee.  Do not do things that cause pain.  Avoid activities where both feet leave the ground at the same time (high-impact activities). Examples are running, jumping rope, and doing jumping jacks.  Work with a physical therapist to make a safe exercise program, as told by your doctor. Managing pain, stiffness, and swelling   If told, put ice on the knee: ? Put ice in a plastic bag. ? Place a towel between your skin and the bag. ? Leave the ice on for 20 minutes, 2-3 times a day.  If told, put pressure (compression) on your injured knee to control swelling, give support, and help with discomfort. Compression may be done with an elastic bandage. General instructions  Take all medicines only as told by your doctor.  Raise (elevate) your knee while you are sitting or lying down. Make sure your knee is higher than your heart.   Sleep with a pillow under your knee.  Do not use any products that contain nicotine or tobacco. These include cigarettes, e-cigarettes, and chewing tobacco. These products may slow down healing. If you need help quitting, ask your doctor.  If you are overweight, work with your doctor and a food expert (dietitian) to set goals to lose weight. Being overweight can make your knee hurt more.  Keep all follow-up visits as told by your doctor. This is important. Contact a doctor if:  The knee pain does not stop.  The knee pain changes or gets worse.  You have a fever along with knee pain.  Your knee feels warm when you touch it.  Your knee gives out or locks up. Get help right away if:  Your knee swells, and the swelling gets worse.  You cannot move your knee.  You have very bad knee pain. Summary  Many things can cause knee pain. The pain often goes away on its own with time and rest.  Your doctor may do tests to find out the cause of the pain.  Pay attention to any changes in your symptoms. Relieve your pain with rest, medicines, light activity, and use of ice.  Get help right away if you cannot move your knee or your knee pain is very bad. This information is not intended to replace advice given to you  by your health care provider. Make sure you discuss any questions you have with your health care provider. Document Released: 06/01/2008 Document Revised: 08/15/2017 Document Reviewed: 08/15/2017 Elsevier Patient Education  2020 [employer].

## 2018-10-31 NOTE — Chronic Care Management (AMB) (Signed)
Chronic Care Management   Follow Up Note   10/31/2018 Name: [name]. MRN: [id number] DOB: [birth year]  Referred by: [name], NP Reason for referral : Chronic Care Management (Diet ) and Care Coordination ([employer] Help Medication letter )   Alex Metro. is a 58 y.o. year old male who is a primary care patient of Cannady, Barbaraann Faster, NP. The CCM team was consulted for assistance with chronic disease management and care coordination needs.    Review of patient status, including review of consultants reports, relevant laboratory and other test results, and collaboration with appropriate care team members and the patient's provider was performed as part of comprehensive patient evaluation and provision of chronic care management services.    Outpatient Encounter Medications as of 10/31/2018  Medication Sig  . canagliflozin (INVOKANA) 300 MG TABS tablet Take 1 tablet (300 mg total) by mouth daily before breakfast.  . Cholecalciferol (VITAMIN D3) 1.25 MG (50000 UT) CAPS Take 50,000 Units by mouth once a week.  . clobetasol cream (TEMOVATE) 4.09 % Apply 1 application topically 2 (two) times daily.  [name] EPINEPHrine 0.3 mg/0.3 mL IJ SOAJ injection Inject 0.3 mLs (0.3 mg total) into the muscle once as needed for up to 1 dose.  . fenofibrate (TRICOR) 48 MG tablet TAKE 1 TABLET BY MOUTH ONCE DAILY  . fluticasone (FLOVENT HFA) 110 MCG/ACT inhaler Inhale 1 puff into the lungs 2 (two) times daily.  [name] gabapentin (NEURONTIN) 300 MG capsule TAKE 1 CAPSULE BY MOUTH AT BEDTIME FOR ONE WEEK  THEN TAKE ONE CAPSULE TWICE DAILY  . lisinopril (ZESTRIL) 20 MG tablet TAKE 1 TABLET BY MOUTH ONCE DAILY  . loratadine (CLARITIN) 10 MG tablet Take 1 tablet (10 mg total) by mouth daily.  . meloxicam (MOBIC) 15 MG tablet Take 1 tablet (15 mg total) by mouth daily.  . metFORMIN (GLUCOPHAGE) 500 MG tablet Take 1 tablet (500 mg total) by mouth 2 (two) times daily with a meal.  . montelukast  (SINGULAIR) 10 MG tablet TAKE 1 TABLET BY MOUTH ONCE EVERY EVENING AT BEDTIME.  . nitroGLYCERIN (NITROSTAT) 0.4 MG SL tablet Place 1 tablet (0.4 mg total) under the tongue every 5 (five) minutes as needed for chest pain.  [name] omeprazole (PRILOSEC) 20 MG capsule TAKE 1 CAPSULE BY MOUTH ONCE DAILY  . ONE TOUCH ULTRA TEST test strip TEST ONCE DAILY  . OneTouch Delica Lancets 81X MISC TEST ONCE A DAY.  Glory Rosebush DELICA LANCETS FINE MISC TEST once daily  . PROAIR HFA 108 (90 Base) MCG/ACT inhaler INHALE 2 PUFFS BY MOUTH EVERY 6 HOURS IF NEEDED FOR COUGH OR WHEEZING.  . simvastatin (ZOCOR) 40 MG tablet TAKE 1 TABLET BY MOUTH ONCE DAILY   No facility-administered encounter medications on file as of 10/31/2018.      Goals Addressed            This Visit's Progress   . 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 Film/video editor.  . Literacy barriers . Cognitive Deficits  Nurse Case Manager Clinical Goal(s):  [name] Over the next 90 days, patient will work with [facility]  to address needs related to learning what patient can eat on Diabetic and PKU diet . Over the next 90 days, patient will work with Administrator, sports (community agency) to learn what he can eat on a diabetic and PKU diet.  Interventions:  . Discussed plans with patient for ongoing care management follow up and provided patient with direct contact information for care management team . Advised patient, providing education and rationale, to check cbg qday  and record, calling PCP  for findings outside established parameters.   [name] Spouse reports patient's daily sugars to be 138, 140, 116 for the last few days. [name] Spouse reports patient has been eating less meat and trying to follow the dieticians recommendations by reducing sodas and using flavored water instead. [name] Spouse called to let me know she had received a letter from medicare concerning patient's  special assistance application.  . Collaboration with CCM pharmacist via secure message while on the phone with spouse, requests letter be brought to the PCP office for her to get a copy. Spouse verbalized understanding.    Patient Self Care Activities:  . Currently UNABLE TO independently manage complicated diet restrictions  Please see past updates related to this goal by clicking on the "Past Updates" button in the selected goal          The patient has been provided with contact information for the care management team and has been advised to call with any health related questions or concerns.     [name] Siedah Sedor RN, BSN Nurse Case Editor, commissioning Family Practice/[facility]  [phone]) Business Mobile

## 2018-10-31 NOTE — Assessment & Plan Note (Signed)
Chronic and ongoing.  States pain is worsening and it is affecting ADLs.  Refuses PT due to cost.  Will repeat imaging both knees and refer to ortho for further recommendations.  Start Meloxicam, script sent (recent GFR 70), will plan to use for short period only.  Have recommended OTC creams and applying ice as needed at home.  Return for worsening or continued issues.

## 2018-10-31 NOTE — Progress Notes (Signed)
BP 112/73   Pulse (!) 55   Temp (!) 97.5 F (36.4 C) (Oral)   Ht 5' 4" (1.626 m)   Wt 174 lb (78.9 kg)   SpO2 98%   BMI 29.87 kg/m    Subjective:    Patient ID: Alex Metro., male    DOB: [birth year], 58 y.o.   MRN: [id number]  HPI: Alex Naill. is a 58 y.o. male  Chief Complaint  Patient presents with  . Knee Pain    bilateral    KNEE PAIN Last imaging of knees done in 2017 and noted narrowing of patellofemoral joint space right and left knee.  He is T2DM and has known diabetic neuropathy.  He discussed whether he could get something he used to take that worked for him, "Hydrocodone".  Discussed with him that this is not used initially for knee pain and discussed other options we could try. He requested a "chair", wheelchair.  Patient with learning disabilities, discussed with him that W/C not appropriate at this time. Duration: chronic Involved knee: bilateral Mechanism of injury: unknown Location:anterior Onset: gradual Severity: 7/10  Quality:  sharp, dull and aching Frequency: constant Radiation: yes Aggravating factors: walking, running, stairs, bending and movement  Alleviating factors: nothing  Status: worse Treatments attempted: none  Relief with NSAIDs?:  No NSAIDs Taken Weakness with weight bearing or walking: yes Sensation of giving way: yes Locking: no Popping: no Bruising: no Swelling: no Redness: no Paresthesias/decreased sensation: no Fevers: no  Relevant past medical, surgical, family and social history reviewed and updated as indicated. Interim medical history since our last visit reviewed. Allergies and medications reviewed and updated.  Review of Systems  Constitutional: Negative for activity change, diaphoresis, fatigue and fever.  Respiratory: Negative for cough, chest tightness, shortness of breath and wheezing.   Cardiovascular: Negative for chest pain, palpitations and leg swelling.  Gastrointestinal: Negative for  abdominal distention, abdominal pain, constipation, diarrhea, nausea and vomiting.  Endocrine: Negative for cold intolerance, heat intolerance, polydipsia, polyphagia and polyuria.  Musculoskeletal: Positive for arthralgias.  Skin: Negative.   Neurological: Negative for dizziness, syncope, weakness, light-headedness, numbness and headaches.  Psychiatric/Behavioral: Negative.     Per HPI unless specifically indicated above     Objective:    BP 112/73   Pulse (!) 55   Temp (!) 97.5 F (36.4 C) (Oral)   Ht 5' 4" (1.626 m)   Wt 174 lb (78.9 kg)   SpO2 98%   BMI 29.87 kg/m   Wt Readings from Last 3 Encounters:  10/31/18 174 lb (78.9 kg)  10/06/18 177 lb (80.3 kg)  10/01/18 175 lb (79.4 kg)    Physical Exam Vitals signs and nursing note reviewed.  Constitutional:      Appearance: He is well-developed.  HENT:     Head: Normocephalic and atraumatic.     Right Ear: Hearing normal. No drainage.     Left Ear: Hearing normal. No drainage.     Mouth/Throat:     Pharynx: Uvula midline.  Eyes:     General: Lids are normal.        Right eye: No discharge.        Left eye: No discharge.     Conjunctiva/sclera: Conjunctivae normal.     Pupils: Pupils are equal, round, and reactive to light.  Neck:     Musculoskeletal: Normal range of motion and neck supple.     Thyroid: No thyromegaly.     Vascular: No carotid bruit  or JVD.     Trachea: Trachea normal.  Cardiovascular:     Rate and Rhythm: Normal rate and regular rhythm.     Heart sounds: Normal heart sounds, S1 normal and S2 normal. No murmur. No gallop.   Pulmonary:     Effort: Pulmonary effort is normal.     Breath sounds: Normal breath sounds.  Abdominal:     General: Bowel sounds are normal.     Palpations: Abdomen is soft. There is no hepatomegaly or splenomegaly.  Musculoskeletal: Normal range of motion.     Right knee: He exhibits normal range of motion, no swelling, no laceration and no erythema.     Left knee: He  exhibits normal range of motion, no swelling, no ecchymosis, no laceration and no erythema. No tenderness found.     Right lower leg: No edema.     Left lower leg: No edema.     Comments: Bilateral knees with crepitus and popping noted with ROM.  Full ROM present.  Slight antalgic gait noted when walking from chair to exam table.  Small, round, mobile cyst to left lateral knee noted.  Skin:    General: Skin is warm and dry.     Capillary Refill: Capillary refill takes less than 2 seconds.     Findings: No rash.  Neurological:     Mental Status: He is alert and oriented to person, place, and time.     Deep Tendon Reflexes: Reflexes are normal and symmetric.  Psychiatric:        Mood and Affect: Mood normal.        Behavior: Behavior normal.        Thought Content: Thought content normal.        Judgment: Judgment normal.     Results for orders placed or performed in visit on 08/27/18  Comprehensive metabolic panel  Result Value Ref Range   Glucose 168 (H) 65 - 99 mg/dL   BUN 12 6 - 24 mg/dL   Creatinine, Ser 1.14 0.76 - 1.27 mg/dL   GFR calc non Af Amer 72 >59 mL/min/1.73   GFR calc Af Amer 83 >59 mL/min/1.73   BUN/Creatinine Ratio 11 9 - 20   Sodium 142 134 - 144 mmol/L   Potassium 4.1 3.5 - 5.2 mmol/L   Chloride 107 (H) 96 - 106 mmol/L   CO2 18 (L) 20 - 29 mmol/L   Calcium 9.2 8.7 - 10.2 mg/dL   Total Protein 7.2 6.0 - 8.5 g/dL   Albumin 4.6 3.8 - 4.9 g/dL   Globulin, Total 2.6 1.5 - 4.5 g/dL   Albumin/Globulin Ratio 1.8 1.2 - 2.2   Bilirubin Total 0.5 0.0 - 1.2 mg/dL   Alkaline Phosphatase 155 (H) 39 - 117 IU/L   AST 36 0 - 40 IU/L   ALT 40 0 - 44 IU/L  Lipid Panel w/o Chol/HDL Ratio  Result Value Ref Range   Cholesterol, Total 174 100 - 199 mg/dL   Triglycerides 420 (H) 0 - 149 mg/dL   HDL 34 (L) >39 mg/dL   VLDL Cholesterol Cal Comment 5 - 40 mg/dL   LDL Calculated Comment 0 - 99 mg/dL  HgB A1c  Result Value Ref Range   Hgb A1c MFr Bld 8.3 (H) 4.8 - 5.6 %   Est.  average glucose Bld gHb Est-mCnc 192 mg/dL      Assessment & Plan:   Problem List Items Addressed This Visit      Other   Knee  pain - Primary    Chronic and ongoing.  States pain is worsening and it is affecting ADLs.  Refuses PT due to cost.  Will repeat imaging both knees and refer to ortho for further recommendations.  Start Meloxicam, script sent (recent GFR 70), will plan to use for short period only.  Have recommended OTC creams and applying ice as needed at home.  Return for worsening or continued issues.      Relevant Orders   Ambulatory referral to Orthopedics       Follow up plan: Return if symptoms worsen or fail to improve.

## 2018-11-03 ENCOUNTER — Other Ambulatory Visit: Payer: Self-pay

## 2018-11-03 ENCOUNTER — Ambulatory Visit: Payer: Self-pay | Admitting: Licensed Clinical Social Worker

## 2018-11-03 DIAGNOSIS — E1140 Type 2 diabetes mellitus with diabetic neuropathy, unspecified: Secondary | ICD-10-CM

## 2018-11-03 NOTE — Chronic Care Management (AMB) (Signed)
  Chronic Care Management    Clinical Social Work Follow Up Note  11/03/2018 Name: [name]. MRN: [id number] DOB: 10/26/1960  Alex Metro. is a 58 y.o. year old male who is a primary care patient of Cannady, Barbaraann Faster, NP. The CCM team was consulted for assistance with [name]  and Financial Difficulties related to managing health care needs.   Review of patient status, including review of consultants reports, other relevant assessments, and collaboration with appropriate care team members and the patient's provider was performed as part of comprehensive patient evaluation and provision of chronic care management services.     Goals Addressed    . "I want to improve my health and self-care" (pt-stated)       Current Barriers:  . Financial constraints . Limited social support . Level of care concerns . ADL IADL limitations . Literacy concerns . Cognitive Deficits . Lacks knowledge of community resource: available financial support resources within the area as well as socialization opportunities within the area  Clinical Social Work Clinical Goal(s):  [name] Over the next 90 days, client will work with SW to address concerns related to improving self-care and implementing it whenever able  Interventions: . Patient interviewed and appropriate assessments performed . Provided mental health counseling with regard to managing everyday stressors and implementing appropriate self-care techniques in order to improve overall health. LCSW provided reflective listening and implemented appropriate interventions to help suppport patient and her emotional needs  . Provided patient with information about OTC benefits that he is uses per quarter. Patient's spouse reports that she has started to order diabetic lotion for patient and this is helping him immensely. . Patient reports trying to implement appropriate self-care by exercise and mowing his yard. Patient reports ongoing  chronic pain with his knees and side. He reports that he completed a X-ray and is waiting to hear back from these results.  . Discussed plans with patient for ongoing care management follow up and provided patient with direct contact information for care management team . Advised patient to contact CCM embedded practice providers and PCP if any urgent concerns arise. [name] LCSW completed referral for a wheelchair ramp due to ongoing mobility concerns. Marylee Floras and her spouse completed home visit and assessment with family but are currently out of funds/supplies in order to build their ramp. Once supplies/funds have increased, Cindy can start this ramp installation process.  . Assisted patient/caregiver with obtaining information about health plan benefits . Provided education to patient/caregiver regarding level of care options.  Patient Self Care Activities:  . Attends all scheduled provider appointments . Calls provider office for new concerns or questions  Please see past updates related to this goal by clicking on the "Past Updates" button in the selected goal      Follow Up Plan: SW will follow up with patient by phone over the next month  [name], [place], MSW, [email or web address] Phone: [phone]

## 2018-11-04 ENCOUNTER — Ambulatory Visit: Payer: Self-pay | Admitting: Family Medicine

## 2018-11-05 ENCOUNTER — Telehealth: Payer: Self-pay

## 2018-11-11 ENCOUNTER — Ambulatory Visit: Payer: Self-pay | Admitting: *Deleted

## 2018-11-11 DIAGNOSIS — E1140 Type 2 diabetes mellitus with diabetic neuropathy, unspecified: Secondary | ICD-10-CM | POA: Diagnosis not present

## 2018-11-11 DIAGNOSIS — E700 Classical phenylketonuria: Secondary | ICD-10-CM

## 2018-11-11 DIAGNOSIS — E701 Other hyperphenylalaninemias: Secondary | ICD-10-CM

## 2018-11-11 NOTE — Chronic Care Management (AMB) (Signed)
Chronic Care Management   Follow Up Note   11/11/2018 Name: [name]. MRN: [id number] DOB: 26-Apr-1960  Referred by: [name], NP Reason for referral : Chronic Care Management (PKU) and Care Coordination (Dietician )   [name]. is a 58 y.o. year old male who is a primary care patient of Cannady, Barbaraann Faster, NP. The CCM team was consulted for assistance with chronic disease management and care coordination needs.    Review of patient status, including review of consultants reports, relevant laboratory and other test results, and collaboration with appropriate care team members and the patient's provider was performed as part of comprehensive patient evaluation and provision of chronic care management services.    SDOH (Social Determinants of Health) screening performed today: Financial Strain . See Care Plan for related entries.   Outpatient Encounter Medications as of 11/11/2018  Medication Sig   canagliflozin (INVOKANA) 300 MG TABS tablet Take 1 tablet (300 mg total) by mouth daily before breakfast.   Cholecalciferol (VITAMIN D3) 1.25 MG (50000 UT) CAPS Take 50,000 Units by mouth once a week.   clobetasol cream (TEMOVATE) 4.00 % Apply 1 application topically 2 (two) times daily.   EPINEPHrine 0.3 mg/0.3 mL IJ SOAJ injection Inject 0.3 mLs (0.3 mg total) into the muscle once as needed for up to 1 dose.   fenofibrate (TRICOR) 48 MG tablet TAKE 1 TABLET BY MOUTH ONCE DAILY   fluticasone (FLOVENT HFA) 110 MCG/ACT inhaler Inhale 1 puff into the lungs 2 (two) times daily.   gabapentin (NEURONTIN) 300 MG capsule TAKE 1 CAPSULE BY MOUTH AT BEDTIME FOR ONE WEEK  THEN TAKE ONE CAPSULE TWICE DAILY   lisinopril (ZESTRIL) 20 MG tablet TAKE 1 TABLET BY MOUTH ONCE DAILY   loratadine (CLARITIN) 10 MG tablet Take 1 tablet (10 mg total) by mouth daily.   meloxicam (MOBIC) 15 MG tablet Take 1 tablet (15 mg total) by mouth daily.   metFORMIN (GLUCOPHAGE) 500 MG  tablet Take 1 tablet (500 mg total) by mouth 2 (two) times daily with a meal.   montelukast (SINGULAIR) 10 MG tablet TAKE 1 TABLET BY MOUTH ONCE EVERY EVENING AT BEDTIME.   nitroGLYCERIN (NITROSTAT) 0.4 MG SL tablet Place 1 tablet (0.4 mg total) under the tongue every 5 (five) minutes as needed for chest pain.   omeprazole (PRILOSEC) 20 MG capsule TAKE 1 CAPSULE BY MOUTH ONCE DAILY   ONE TOUCH ULTRA TEST test strip TEST ONCE DAILY   OneTouch Delica Lancets 86P MISC TEST ONCE A DAY.   ONETOUCH DELICA LANCETS FINE MISC TEST once daily   PROAIR HFA 108 (90 Base) MCG/ACT inhaler INHALE 2 PUFFS BY MOUTH EVERY 6 HOURS IF NEEDED FOR COUGH OR WHEEZING.   simvastatin (ZOCOR) 40 MG tablet TAKE 1 TABLET BY MOUTH ONCE DAILY   No facility-administered encounter medications on file as of 11/11/2018.      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   Literacy barriers  Cognitive Deficits  Unable to travel to specialists outside of [facility] related to law enforced electronic monitoring.   Nurse Case Manager Clinical Goal(s):   Over the next 90 days, patient will work with [facility]  to address needs related to learning what patient can  eat on Diabetic and PKU diet  Over the next 90 days, patient will work with Administrator, sports (community agency) to learn what he can eat on a diabetic and PKU diet.  Interventions:   Discussed plans with patient for ongoing care management follow up and provided patient with direct contact information for care management team  Received a call from [place] working patient and his spouse with general guidelines for diet. She wanted to schedule an appointment to f/u with patient and has been unable to reach him. Discussed with Colleen patient's phone was broken and the wife's number was  the best contact. Also made her aware I would be reaching out to spouse and would be glad to make her aware of upcoming appointment date Monday 8/31 @ 1:30pm [address].   [employer] rep made me aware she would be able to deliver the sample of Phenex-2 to Colleen's office on Monday morning. Gave [employer] rep Colleen's office number so they could collaborate. Also sent an Email connecting them.   Colleen and I discussed this patient's further need for support from a dietician who specializes in PKU. She plans to reach out to her professional colleagues to attempt to find this resource. [employer] rep is also going to reach out to her professional colleagues to attempt to find this resource.   Patient continues to have a barrier for travelling outside the county, but we discussed a phone visit with the specialty dietician collaborating with PCP for any needed lab work.   Spoke with patient's spouse, made her aware of patient's upcoming appt with dietician, also texted spouse so she would have a written reminder. Verbal confirmation from spouse they plan to attend appointment.    Patient Self Care Activities:   Patient needs continued support in his efforts towards adherence to general PKU guidelines within the recommendations for a diabetic diet.    Please see past updates related to this goal by clicking on the "Past Updates" button in the selected goal          The care management team will reach out to the patient again over the next 30 days.  The patient has been provided with contact information for the care management team and has been advised to call with any health related questions or concerns.    [name] Roneshia Drew RN, BSN Nurse Case Editor, commissioning Family Practice/[facility] Care Management  [phone]) Business Mobile

## 2018-11-11 NOTE — Patient Instructions (Signed)
Thank you allowing the Chronic Care Management Team to be a part of your care! It was a pleasure speaking with you today!   CCM (Chronic Care Management) Team   Omari Mcmanaway RN, BSN Nurse Care Coordinator  [phone]  Cat[facility] PharmD  Clinical Pharmacist  [phone]  Eula Fried LCSW Clinical Social Worker [phone]  Goals Addressed            This Visit's Progress   . 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 Film/video editor.  . Literacy barriers . Cognitive Deficits . Unable to travel to specialists outside of [facility] related to law enforced electronic monitoring.   Nurse Case Manager Clinical Goal(s):  [name] Over the next 90 days, patient will work with [facility]  to address needs related to learning what patient can eat on Diabetic and PKU diet . Over the next 90 days, patient will work with Administrator, sports (community agency) to learn what he can eat on a diabetic and PKU diet.  Interventions:  . Discussed plans with patient for ongoing care management follow up and provided patient with direct contact information for care management team . Received a call from [place] working patient and his spouse with general guidelines for diet. She wanted to schedule an appointment to f/u with patient and has been unable to reach him. Discussed with Colleen patient's phone was broken and the wife's number was the best contact. Also made her aware I would be reaching out to spouse and would be glad to make her aware of upcoming appointment date Monday 8/31 @ 1:30pm [address].  . [employer] rep made me aware she would be able to deliver the sample of Phenex-2 to Colleen's office on Monday morning. Gave [employer] rep Colleen's office number so they could collaborate. Also sent an Email connecting them.  Jaclyn Shaggy and I discussed this patient's further need for support from a  dietician who specializes in PKU. She plans to reach out to her professional colleagues to attempt to find this resource. [employer] rep is also going to reach out to her professional colleagues to attempt to find this resource.  . Patient continues to have a barrier for travelling outside the county, but we discussed a phone visit with the specialty dietician collaborating with PCP for any needed lab work.  [name] Spoke with patient's spouse, made her aware of patient's upcoming appt with dietician, also texted spouse so she would have a written reminder. Verbal confirmation from spouse they plan to attend appointment.    Patient Self Care Activities:  . Patient needs continued support in his efforts towards adherence to general PKU guidelines within the recommendations for a diabetic diet.    Please see past updates related to this goal by clicking on the "Past Updates" button in the selected goal         The patient verbalized understanding of instructions provided today and declined a print copy of patient instruction materials.   The patient has been provided with contact information for the care management team and has been advised to call with any health related questions or concerns.

## 2018-11-15 ENCOUNTER — Other Ambulatory Visit: Payer: Self-pay | Admitting: Family Medicine

## 2018-11-17 ENCOUNTER — Telehealth: Payer: Self-pay | Admitting: Dietician

## 2018-11-17 NOTE — Telephone Encounter (Signed)
  Called and spoke with patient's wife to let her know that the [employer] representative brought a can of the PKU supplement. Also, informed her that I had contacted the metabolic clinic at [facility] and an RD, Vaughan Basta had left a message that she does nutrition counseling for  adults with PKU and that they are doing "remote" visits.  I told Ms. Potteiger that I have given the RD's name and number to [name], RN case manager to see if a phone visit can be set up for Mr. Deeb.  Ms. Kingston stated that Mr. Buttram continues to limit sugar sweetened beverages and is eating smaller portions of meat. She gave examples of splitting a chicken breast versed Mr. Wilkie eating the whole piece and a portion of 2 fish sticks verses  She stated, "He is doing real good" in reference to eating less meat and dairy. She reports his blood sugars are in 130's -140's. She stated that Mr. Dimond can come to pick up the PKU supplement today. I asked her to wait until she talks with the dietitian from [facility] who specializes in PKU for adults before using the supplement and she stated she would.

## 2018-12-02 ENCOUNTER — Encounter: Payer: Self-pay | Admitting: Nurse Practitioner

## 2018-12-02 ENCOUNTER — Other Ambulatory Visit: Payer: Self-pay

## 2018-12-02 ENCOUNTER — Ambulatory Visit (INDEPENDENT_AMBULATORY_CARE_PROVIDER_SITE_OTHER): Payer: Medicare Other | Admitting: *Deleted

## 2018-12-02 ENCOUNTER — Ambulatory Visit: Payer: Medicare Other | Admitting: Pharmacist

## 2018-12-02 ENCOUNTER — Ambulatory Visit (INDEPENDENT_AMBULATORY_CARE_PROVIDER_SITE_OTHER): Payer: Medicare Other | Admitting: Nurse Practitioner

## 2018-12-02 VITALS — BP 125/76 | HR 60 | Temp 97.4°F | Wt 177.0 lb

## 2018-12-02 DIAGNOSIS — E119 Type 2 diabetes mellitus without complications: Secondary | ICD-10-CM | POA: Diagnosis not present

## 2018-12-02 DIAGNOSIS — Z23 Encounter for immunization: Secondary | ICD-10-CM | POA: Diagnosis not present

## 2018-12-02 DIAGNOSIS — E559 Vitamin D deficiency, unspecified: Secondary | ICD-10-CM

## 2018-12-02 DIAGNOSIS — E701 Other hyperphenylalaninemias: Secondary | ICD-10-CM

## 2018-12-02 DIAGNOSIS — J439 Emphysema, unspecified: Secondary | ICD-10-CM

## 2018-12-02 DIAGNOSIS — E1169 Type 2 diabetes mellitus with other specified complication: Secondary | ICD-10-CM | POA: Diagnosis not present

## 2018-12-02 DIAGNOSIS — E700 Classical phenylketonuria: Secondary | ICD-10-CM

## 2018-12-02 DIAGNOSIS — E785 Hyperlipidemia, unspecified: Secondary | ICD-10-CM

## 2018-12-02 DIAGNOSIS — I10 Essential (primary) hypertension: Secondary | ICD-10-CM

## 2018-12-02 LAB — LIPID PANEL PICCOLO, WAIVED
Chol/HDL Ratio Piccolo,Waive: 3.3 mg/dL
Cholesterol Piccolo, Waived: 136 mg/dL (ref <200)
HDL Chol Piccolo, Waived: 41 mg/dL — ABNORMAL LOW (ref >59)
LDL Chol Calc Piccolo Waived: 66 mg/dL (ref <100)
Triglycerides Piccolo,Waived: 145 mg/dL (ref <150)
VLDL Chol Calc Piccolo,Waive: 229 mg/dL — ABNORMAL HIGH (ref <30)

## 2018-12-02 LAB — BAYER DCA HB A1C WAIVED: HB A1C (BAYER DCA - WAIVED): 7.1 % — ABNORMAL HIGH (ref <7.0)

## 2018-12-02 LAB — MICROALBUMIN, URINE WAIVED
Creatinine, Urine Waived: 50 mg/dL (ref 10–300)
Microalb, Ur Waived: 10 mg/L (ref 0–19)

## 2018-12-02 NOTE — Assessment & Plan Note (Signed)
Chronic, ongoing with BP at goal today.  Continue current medication regimen.  Will place referral to return to cardiology for follow-up due to recent CP episode, did recommend he carry NTG with him and use as needed if CP presents (educated on this).  Obtain CMP today.  Return to office in 3 months or sooner if worsening CP present.

## 2018-12-02 NOTE — Assessment & Plan Note (Addendum)
Chronic, ongoing.  LDL 66, TCHOL 136, and TRIGS 145 (vastly improved with Fenofibrate, previous was 420).  Continue current medication regimen and adjust as needed.  CMP today.

## 2018-12-02 NOTE — Assessment & Plan Note (Addendum)
Chronic, ongoing with A1C improved today 7.1% (previous 8.3%).  Praised patient for continued focus on diet and weight loss.  Continue current medication regimen.  Recommend continuing to check BS at least twice a day at home.  Return to office in 3 months for follow-up.

## 2018-12-02 NOTE — Chronic Care Management (AMB) (Signed)
Chronic Care Management   Follow Up Note   12/02/2018 Name: [name]. MRN: [id number] DOB: 18-Apr-1960  Referred by: [name], NP Reason for referral : Chronic Care Management (DM, ) and Care Coordination (New Dietician )   [name]. is a 58 y.o. year old male who is a primary care patient of Rogers, Alex Faster, NP. The CCM team was consulted for assistance with chronic disease management and care coordination needs.    Review of patient status, including review of consultants reports, relevant laboratory and other test results, and collaboration with appropriate care team members and the patient's provider was performed as part of comprehensive patient evaluation and provision of chronic care management services.    SDOH (Social Determinants of Health) screening performed today: None. See Care Plan for related entries.   Advanced Directives Status: N See Care Plan and Vynca application for related entries.  Outpatient Encounter Medications as of 12/02/2018  Medication Sig Note   canagliflozin (INVOKANA) 300 MG TABS tablet Take 1 tablet (300 mg total) by mouth daily before breakfast.    Cholecalciferol (VITAMIN D3) 1.25 MG (50000 UT) CAPS Take 50,000 Units by mouth once a week.    clobetasol cream (TEMOVATE) 2.72 % Apply 1 application topically 2 (two) times daily. (Patient not taking: Reported on 12/02/2018)    EPINEPHrine 0.3 mg/0.3 mL IJ SOAJ injection Inject 0.3 mLs (0.3 mg total) into the muscle once as needed for up to 1 dose. (Patient not taking: Reported on 12/02/2018)    fenofibrate (TRICOR) 48 MG tablet TAKE 1 TABLET BY MOUTH ONCE DAILY    fluticasone (FLOVENT HFA) 110 MCG/ACT inhaler Inhale 1 puff into the lungs 2 (two) times daily. 12/02/2018: Taking QAM   gabapentin (NEURONTIN) 300 MG capsule TAKE 1 CAPSULE BY MOUTH AT BEDTIME FOR ONE WEEK  THEN TAKE ONE CAPSULE TWICE DAILY    lisinopril (ZESTRIL) 20 MG tablet TAKE 1 TABLET BY MOUTH ONCE  DAILY    loratadine (CLARITIN) 10 MG tablet Take 1 tablet (10 mg total) by mouth daily. (Patient not taking: Reported on 12/02/2018)    meloxicam (MOBIC) 15 MG tablet Take 1 tablet (15 mg total) by mouth daily.    metFORMIN (GLUCOPHAGE) 500 MG tablet Take 1 tablet (500 mg total) by mouth 2 (two) times daily with a meal.    montelukast (SINGULAIR) 10 MG tablet TAKE 1 TABLET BY MOUTH ONCE EVERY EVENING AT BEDTIME.    nitroGLYCERIN (NITROSTAT) 0.4 MG SL tablet Place 1 tablet (0.4 mg total) under the tongue every 5 (five) minutes as needed for chest pain. (Patient not taking: Reported on 12/02/2018)    omeprazole (PRILOSEC) 20 MG capsule TAKE 1 CAPSULE BY MOUTH ONCE DAILY (Patient not taking: Reported on 5/36/6440)    OneTouch Delica Lancets 34V MISC TEST ONCE A DAY.    ONETOUCH DELICA LANCETS FINE MISC TEST once daily    ONETOUCH ULTRA test strip TEST ONCE DAILY    PROAIR HFA 108 (90 Base) MCG/ACT inhaler INHALE 2 PUFFS BY MOUTH EVERY 6 HOURS IF NEEDED FOR COUGH OR WHEEZING. (Patient not taking: Reported on 12/02/2018)    simvastatin (ZOCOR) 40 MG tablet TAKE 1 TABLET BY MOUTH ONCE DAILY    No facility-administered encounter medications on file as of 12/02/2018.      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   Literacy barriers  Cognitive Deficits  Unable to travel to specialists outside of [facility] related to law enforced electronic monitoring.   Nurse Case Manager Clinical Goal(s):   Over the next 90 days, patient will work with [facility]  to address needs related to learning what patient can eat on Diabetic and PKU diet  Over the next 90 days, patient will work with Administrator, sports (community agency) to learn what he can eat on a diabetic and PKU diet.  Interventions:   Discussed plans with patient for  ongoing care management follow up and provided patient with direct contact information for care management team  There are 2 RDs from [facility] system that are specifically dedicated to supporting all PKU patients in the state of [place].   Alex Campbell [place].[email or web address])   Alex Sledge [place].[email or web address] .[email or web address])  Plan to speak to [name] and to fax over a referral to [phone] and call [phone] to register the patient.   Patient has obtained sample of Phenex-2 from [employer] rep, patient's spouse confirms she has sample.   Congratulated patient on improved A1C and Triglycerides. Printed labs provided per patient request.   Patient requested PCP visit information be provided to his spouse related to his learning disability.   Call placed to spouse, she stated patient had improved with decreasing soft drinks and meat in his diet. Reviewed the plan to obtain knee braces and dietician consult from [facility]. Made her aware of improved labs and patient's need to f/u with cardiology for recurring exercise induced chest pain.   Plan to fax necessary paperwork to [place] for patient's knee braces.[employer] fax: [phone]    Patient Self Care Activities:   Patient needs continued support in his efforts towards adherence to general PKU guidelines within the recommendations for a diabetic diet.    Please see past updates related to this goal by clicking on the "Past Updates" button in the selected goal          The care management team will reach out to the patient again over the next 30 days.  The patient has been provided with contact information for the care management team and has been advised to call with any health related questions or concerns.    Alex Morse [name] RN, BSN Nurse Case Editor, commissioning Family Practice/[facility] Care Management  [phone]) Business Mobile

## 2018-12-02 NOTE — Assessment & Plan Note (Addendum)
Chronic, ongoing.  Continue current medication regimen and adjust as needed.  Recommend using Proair as needed if episodes of SOB present.  Plan on spirometry next visit.

## 2018-12-02 NOTE — Progress Notes (Signed)
BP 125/76   Pulse 60   Temp (!) 97.4 F (36.3 C) (Oral)   Wt 177 lb (80.3 kg)   SpO2 97%   BMI 30.38 kg/m    Subjective:    Patient ID: Alex Metro., male    DOB: [birth year], 58 y.o.   MRN: [id number]  HPI: Alex Marsalis. is a 58 y.o. male  Chief Complaint  Patient presents with  . Diabetes  . Hyperlipidemia  . Hypertension   DIABETES Last A1C 8.3%.  We restarted Metformin 09/03/2018, 500 MG BID. Hypoglycemic episodes:no Polydipsia/polyuria: no Visual disturbance: no Chest pain: no Paresthesias: no Glucose Monitoring: yes  Accucheck frequency: Daily  Fasting glucose: 160-170  Post prandial:  Evening: 250-300  Before meals: Taking Insulin?: no  Long acting insulin:  Short acting insulin: Blood Pressure Monitoring: not checking Retinal Examination: Up to Date Foot Exam: Up to Date Pneumovax: Up to Date Influenza: Up to Date Aspirin: no   HYPERTENSION / HYPERLIPIDEMIA Has h/o pancreatitis hospital admissions, trigs on recent labs above 400.  Added on Fenofibrate in June.  Continues on Simvastatin and Lisinopril. Previously followed by Dr. Humphrey Rolls, has not seen since August 2019.  Has history of angina, NTG. Reports he recently had episode of chest pain after mowing lawn, when he was putting lawn mower away.  States at time he felt nauseous and threw-up "a little yellow stuff" and felt some SOB.  This went away without medication or intervention after several seconds.  Has history of "lots of" episodes like this he reports, appears to be reason he initially was sent to cardiology.  He inquired into whether he could get oxygen to wear, as he feels he needs this "all the time".  His wife uses O2 at home, discussed with him reasons for continuous O2 need.  He does report he was told "to go back to see Dr. Humphrey Rolls if I had any more chest pain". Satisfied with current treatment? yes Duration of hypertension: chronic BP monitoring frequency: not checking BP  range:  BP medication side effects: no Duration of hyperlipidemia: chronic Cholesterol medication side effects: no Cholesterol supplements: none Medication compliance: good compliance Aspirin: no Recent stressors: no Recurrent headaches: no Visual changes: no Palpitations: no Dyspnea: no Chest pain: no Lower extremity edema: no Dizzy/lightheaded: no  COPD Feels his inhaler is not working, had an episode SOB with activity recently, refer to above note.  He does not use Proair often, does use his Flovent daily.  Does not carry his NTG or Proair with him when doing activities or going on outings, discussed with him at length importance of carrying these along with him so he can use when having episodes of CP or SOB. COPD status: stable Satisfied with current treatment?: yes Oxygen use: no Dyspnea frequency: x one episode recently Cough frequency: none Rescue inhaler frequency:  none Limitation of activity: no Productive cough: none Last Spirometry: July 2016 Pneumovax: Up to Date Influenza: Up to Date  Relevant past medical, surgical, family and social history reviewed and updated as indicated. Interim medical history since our last visit reviewed. Allergies and medications reviewed and updated.  Review of Systems  Constitutional: Negative for activity change, diaphoresis, fatigue and fever.  Respiratory: Negative for cough, chest tightness and wheezing.   Cardiovascular: Negative for palpitations and leg swelling.  Gastrointestinal: Negative for abdominal distention, abdominal pain, constipation, diarrhea, nausea and vomiting.  Endocrine: Negative for cold intolerance, heat intolerance, polydipsia, polyphagia and polyuria.  Neurological: Negative  for dizziness, syncope, weakness, light-headedness, numbness and headaches.  Psychiatric/Behavioral: Negative.     Per HPI unless specifically indicated above     Objective:    BP 125/76   Pulse 60   Temp (!) 97.4 F (36.3 C)  (Oral)   Wt 177 lb (80.3 kg)   SpO2 97%   BMI 30.38 kg/m   Wt Readings from Last 3 Encounters:  12/02/18 177 lb (80.3 kg)  10/31/18 174 lb (78.9 kg)  10/06/18 177 lb (80.3 kg)    Physical Exam Vitals signs and nursing note reviewed.  Constitutional:      General: He is awake. He is not in acute distress.    Appearance: He is well-developed. He is obese. He is not ill-appearing.  HENT:     Head: Normocephalic and atraumatic.     Right Ear: Hearing normal. No drainage.     Left Ear: Hearing normal. No drainage.  Eyes:     General: Lids are normal.        Right eye: No discharge.        Left eye: No discharge.     Conjunctiva/sclera: Conjunctivae normal.     Pupils: Pupils are equal, round, and reactive to light.  Neck:     Musculoskeletal: Normal range of motion and neck supple.     Vascular: No carotid bruit.  Cardiovascular:     Rate and Rhythm: Normal rate and regular rhythm.     Heart sounds: Normal heart sounds, S1 normal and S2 normal. No murmur. No gallop.   Pulmonary:     Effort: Pulmonary effort is normal. No accessory muscle usage or respiratory distress.     Breath sounds: Normal breath sounds.     Comments: No adventitious sounds noted. Abdominal:     General: Bowel sounds are normal.     Palpations: Abdomen is soft.  Musculoskeletal: Normal range of motion.     Right lower leg: No edema.     Left lower leg: No edema.  Skin:    General: Skin is warm and dry.  Neurological:     Mental Status: He is alert and oriented to person, place, and time.  Psychiatric:        Attention and Perception: Attention normal.        Mood and Affect: Mood normal.        Speech: Speech normal.        Behavior: Behavior normal. Behavior is cooperative.        Thought Content: Thought content normal.        Judgment: Judgment normal.      Diabetic Foot Exam - Simple   Simple Foot Form Visual Inspection No deformities, no ulcerations, no other skin breakdown bilaterally:  Yes Sensation Testing See comments: Yes Pulse Check Posterior Tibialis and Dorsalis pulse intact bilaterally: Yes Comments Sensation 6/10 right and 7/10 left    Results for orders placed or performed in visit on 12/02/18  Bayer DCA Hb A1c Waived  Result Value Ref Range   HB A1C (BAYER DCA - WAIVED) 7.1 (H) <7.0 %  Lipid Panel Piccolo, Waived  Result Value Ref Range   Cholesterol Piccolo, Waived 136 <200 mg/dL   HDL Chol Piccolo, Waived 41 (L) >59 mg/dL   Triglycerides Piccolo,Waived 145 <150 mg/dL   Chol/HDL Ratio Piccolo,Waive 3.3 mg/dL   LDL Chol Calc Piccolo Waived 66 <100 mg/dL   VLDL Chol Calc Piccolo,Waive 229 (H) <30 mg/dL  Microalbumin, Urine Waived  Result Value Ref  Range   Microalb, Ur Waived 10 0 - 19 mg/L   Creatinine, Urine Waived 50 10 - 300 mg/dL   Microalb/Creat Ratio 30-300 (H) <30 mg/g      Assessment & Plan:   Problem List Items Addressed This Visit      Cardiovascular and Mediastinum   Hypertension    Chronic, ongoing with BP at goal today.  Continue current medication regimen.  Will place referral to return to cardiology for follow-up due to recent CP episode, did recommend he carry NTG with him and use as needed if CP presents (educated on this).  Obtain CMP today.  Return to office in 3 months or sooner if worsening CP present.      Relevant Orders   Ambulatory referral to Cardiology     Respiratory   Pulmonary emphysema (HCC)    Chronic, ongoing.  Continue current medication regimen and adjust as needed.  Recommend using Proair as needed if episodes of SOB present.  Plan on spirometry next visit.        Endocrine   Diabetes mellitus type 2, controlled ([place])    Chronic, ongoing with A1C improved today 7.1% (previous 8.3%).  Praised patient for continued focus on diet and weight loss.  Continue current medication regimen.  Recommend continuing to check BS at least twice a day at home.  Return to office in 3 months for follow-up.      Relevant  Orders   Bayer DCA Hb A1c Waived (Completed)   Microalbumin, Urine Waived (Completed)   Hyperlipidemia associated with type 2 diabetes mellitus (HCC)    Chronic, ongoing.  LDL 66, TCHOL 136, and TRIGS 145 (vastly improved with Fenofibrate, previous was 420).  Continue current medication regimen and adjust as needed.  CMP today.      Relevant Orders   Lipid Panel Piccolo, Waived (Completed)   Comprehensive metabolic panel     Other   Vitamin D deficiency - Primary    Recheck Vit D level today and start supplement as needed.      Relevant Orders   VITAMIN D 25 Hydroxy (Vit-D Deficiency, Fractures)    Other Visit Diagnoses    Need for influenza vaccination       Relevant Orders   Flu Vaccine QUAD 36+ mos IM (Completed)      Time: 25 minutes, >50% spent counseling/or care coordination due to learning delays requires more time for education and discussion   Follow up plan: Return in about 3 months (around 03/03/2019) for T2DM, HTN/HLD, Emphysema (needs spirometry).

## 2018-12-02 NOTE — Patient Instructions (Signed)
Influenza (Flu) Vaccine (Inactivated or Recombinant): What You Need to Know 1. Why get vaccinated? Influenza vaccine can prevent influenza (flu). Flu is a contagious disease that spreads around the [place] every year, usually between October and May. Anyone can get the flu, but it is more dangerous for some people. Infants and young children, people 56 years of age and older, pregnant women, and people with certain health conditions or a weakened immune system are at greatest risk of flu complications. Pneumonia, bronchitis, sinus infections and ear infections are examples of flu-related complications. If you have a medical condition, such as heart disease, cancer or diabetes, flu can make it worse. Flu can cause fever and chills, sore throat, muscle aches, fatigue, cough, headache, and runny or stuffy nose. Some people may have vomiting and diarrhea, though this is more common in children than adults. Each year thousands of people in the [place] die from flu, and many more are hospitalized. Flu vaccine prevents millions of illnesses and flu-related visits to the doctor each year. 2. Influenza vaccine CDC recommends everyone 25 months of age and older get vaccinated every flu season. Children 6 months through 41 years of age may need 2 doses during a single flu season. Everyone else needs only 1 dose each flu season. It takes about 2 weeks for protection to develop after vaccination. There are many flu viruses, and they are always changing. Each year a new flu vaccine is made to protect against three or four viruses that are likely to cause disease in the upcoming flu season. Even when the vaccine doesn't exactly match these viruses, it may still provide some protection. Influenza vaccine does not cause flu. Influenza vaccine may be given at the same time as other vaccines. 3. Talk with your health care provider Tell your vaccine provider if the person getting the vaccine:  Has had an  allergic reaction after a previous dose of influenza vaccine, or has any severe, life-threatening allergies.  Has ever had Guillain-Barr Syndrome (also called GBS). In some cases, your health care provider may decide to postpone influenza vaccination to a future visit. People with minor illnesses, such as a cold, may be vaccinated. People who are moderately or severely ill should usually wait until they recover before getting influenza vaccine. Your health care provider can give you more information. 4. Risks of a vaccine reaction  Soreness, redness, and swelling where shot is given, fever, muscle aches, and headache can happen after influenza vaccine.  There may be a very small increased risk of Guillain-Barr Syndrome (GBS) after inactivated influenza vaccine (the flu shot). Young children who get the flu shot along with pneumococcal vaccine (PCV13), and/or DTaP vaccine at the same time might be slightly more likely to have a seizure caused by fever. Tell your health care provider if a child who is getting flu vaccine has ever had a seizure. People sometimes faint after medical procedures, including vaccination. Tell your provider if you feel dizzy or have vision changes or ringing in the ears. As with any medicine, there is a very remote chance of a vaccine causing a severe allergic reaction, other serious injury, or death. 5. What if there is a serious problem? An allergic reaction could occur after the vaccinated person leaves the clinic. If you see signs of a severe allergic reaction (hives, swelling of the face and throat, difficulty breathing, a fast heartbeat, dizziness, or weakness), call 9-1-1 and get the person to the nearest hospital. For other signs that  concern you, call your health care provider. Adverse reactions should be reported to the Vaccine Adverse Event Reporting System (VAERS). Your health care provider will usually file this report, or you can do it yourself. Visit the  VAERS website at www.vaers.SamedayNews.es or call (903) 082-0267.VAERS is only for reporting reactions, and VAERS staff do not give medical advice. 6. The National Vaccine Injury Compensation Program The [employer] Vaccine Injury Compensation Program (VICP) is a federal program that was created to compensate people who may have been injured by certain vaccines. Visit the VICP website at GoldCloset.com.ee or call 579 781 1412 to learn about the program and about filing a claim. There is a time limit to file a claim for compensation. 7. How can I learn more?  Ask your healthcare provider.  Call your local or state health department.  Contact the Centers for Disease Control and Prevention (CDC): ? Call 403 444 4460 (1-800-CDC-INFO) or ? Visit CDC's https://gibson.com/ Vaccine Information Statement (Interim) Inactivated Influenza Vaccine (10/31/2017) This information is not intended to replace advice given to you by your health care provider. Make sure you discuss any questions you have with your health care provider. Document Released: 12/28/2005 Document Revised: 06/24/2018 Document Reviewed: 11/04/2017 Elsevier Patient Education  2020 [employer].

## 2018-12-02 NOTE — Assessment & Plan Note (Signed)
Recheck Vit D level today and start supplement as needed.

## 2018-12-02 NOTE — Patient Instructions (Signed)
Visit Information  Goals Addressed            This Visit's Progress     Patient Stated   . PharmD "I want to work on my diabetes" (pt-stated)       Current Barriers:  . Diabetes: uncontrolled though improved, most recent A1c 7.1% today . Current antihyperglycemic regimen: metformin 500 mg BID, Invokana 300 mg daily . Current meal patterns: Reports that he has been cutting back on sodas somewhat. Did meet with dietician to discuss PKU and diabetic friendly diet  . Current blood glucose readings: o 7 day average: 233; 4 readings o 14 day average: 216; 9 readings o 30 day average: 211; 21 readings . Cardiovascular risk reduction: o Current hypertensive regimen: lisinopril 20 mg daily; does not have a BP cuff currently, would like one  o Current hyperlipidemia regimen: simvastatin 40 mg daily; fenofibrate 48 mg daily; TG much better control <150 now; o Notes episodes of chest pain, notes it occurred right after he was mowing his lawn   Pharmacist Clinical Goal(s):  [name] Over the next 90 days, patient with work with PharmD and primary care provider to address optimized medication management  Interventions: . Congratulated patient on improvement in A1c and triglycerides.  . Patient notes he is taking his morning medications regularly ~9 am every day. He notes compliance. Moving forward, will evaluate for interested in getting medications pill packed . Will investigate if patient has OTC benefits for BP cuff.    Patient Self Care Activities:  . Patient will check blood glucose daily , document, and provide at future appointments . Patient will take medications as prescribed . Patient will report any questions or concerns to provider   Please see past updates related to this goal by clicking on the "Past Updates" button in the selected goal         The patient verbalized understanding of instructions provided today and declined a print copy of patient instruction materials.   Plan:   - CCM team will outreach this patient for continued support in the next 4-5 weeks  Catie Darnelle Maffucci, PharmD Clinical Pharmacist [place] [phone]

## 2018-12-02 NOTE — Patient Instructions (Signed)
Thank you allowing the Chronic Care Management Team to be a part of your care! It was a pleasure speaking with you today!  CCM (Chronic Care Management) Team   Gottlieb Zuercher RN, BSN Nurse Care Coordinator  [phone]  Catie [facility] PharmD  Clinical Pharmacist  [phone]  Eula Fried LCSW Clinical Social Worker [phone]  Goals Addressed            This Visit's Progress   . RN-I need help with my Diet (pt-stated)       Current Barriers:  [name] Knowledge Deficits related to diabetic diet and PKU diet  . Film/video editor.  . Literacy barriers . Cognitive Deficits . Unable to travel to specialists outside of [facility] related to law enforced electronic monitoring.   Nurse Case Manager Clinical Goal(s):  [name] Over the next 90 days, patient will work with [facility]  to address needs related to learning what patient can eat on Diabetic and PKU diet . Over the next 90 days, patient will work with Administrator, sports (community agency) to learn what he can eat on a diabetic and PKU diet.  Interventions:  . Discussed plans with patient for ongoing care management follow up and provided patient with direct contact information for care management team . There are 2 RDs from [facility] system that are specifically dedicated to supporting all PKU patients in the state of [place].  Norlene Campbell Raquel Sarna.[email or web address] .[email or web address])  . Christine [address] [place].[email or web address] .[email or web address]) . Plan to speak to [facility] and to fax over a referral to [phone] and call [phone] to register the patient.  . Patient has obtained sample of Phenex-2 from [employer] rep, patient's spouse confirms she has sample.  . Congratulated patient on improved A1C and Triglycerides. Printed labs provided per patient request.  . Patient requested PCP visit information be provided to his spouse related to his learning disability.  . Call placed to spouse, she stated patient had improved with  decreasing soft drinks and meat in his diet. Reviewed the plan to obtain knee braces and dietician consult from [facility], [facility], [facility]. Made her aware of improved labs and patient's need to f/u with cardiology for recurring exercise induced chest pain.  . Plan to fax necessary paperwork to [place] for patient's knee braces.[place] fax: [phone]    Patient Self Care Activities:  . Patient needs continued support in his efforts towards adherence to general PKU guidelines within the recommendations for a diabetic diet.    Please see past updates related to this goal by clicking on the "Past Updates" button in the selected goal         The patient verbalized understanding of instructions provided today and declined a print copy of patient instruction materials.   The patient has been provided with contact information for the care management team and has been advised to call with any health related questions or concerns.

## 2018-12-02 NOTE — Chronic Care Management (AMB) (Signed)
Chronic Care Management   Follow Up Note   12/02/2018 Name: [name]. MRN: [id number] DOB: 02-27-1961  Referred by: [name], NP Reason for referral : Chronic Care Management (Medication Management)   [name]. is a 58 y.o. year old male who is a primary care patient of Cannady, Barbaraann Faster, NP. The CCM team was consulted for assistance with chronic disease management and care coordination needs.    Met with patient face to face with Janci Minor, RN CM, at primary care provider appointment.   Review of patient status, including review of consultants reports, relevant laboratory and other test results, and collaboration with appropriate care team members and the patient's provider was performed as part of comprehensive patient evaluation and provision of chronic care management services.    SDOH (Social Determinants of Health) screening performed today: Food Insecurity  Physical Activity. See Care Plan for related entries.    Outpatient Encounter Medications as of 12/02/2018  Medication Sig Note  . canagliflozin (INVOKANA) 300 MG TABS tablet Take 1 tablet (300 mg total) by mouth daily before breakfast.   . Cholecalciferol (VITAMIN D3) 1.25 MG (50000 UT) CAPS Take 50,000 Units by mouth once a week.   . fenofibrate (TRICOR) 48 MG tablet TAKE 1 TABLET BY MOUTH ONCE DAILY   . fluticasone (FLOVENT HFA) 110 MCG/ACT inhaler Inhale 1 puff into the lungs 2 (two) times daily. 12/02/2018: Taking QAM  . gabapentin (NEURONTIN) 300 MG capsule TAKE 1 CAPSULE BY MOUTH AT BEDTIME FOR ONE WEEK  THEN TAKE ONE CAPSULE TWICE DAILY   . lisinopril (ZESTRIL) 20 MG tablet TAKE 1 TABLET BY MOUTH ONCE DAILY   . meloxicam (MOBIC) 15 MG tablet Take 1 tablet (15 mg total) by mouth daily.   . metFORMIN (GLUCOPHAGE) 500 MG tablet Take 1 tablet (500 mg total) by mouth 2 (two) times daily with a meal.   . montelukast (SINGULAIR) 10 MG tablet TAKE 1 TABLET BY MOUTH ONCE EVERY EVENING AT  BEDTIME.   Glory Rosebush Delica Lancets 56E MISC TEST ONCE A DAY.   Glory Rosebush DELICA LANCETS FINE MISC TEST once daily   . ONETOUCH ULTRA test strip TEST ONCE DAILY   . simvastatin (ZOCOR) 40 MG tablet TAKE 1 TABLET BY MOUTH ONCE DAILY   . clobetasol cream (TEMOVATE) 3.32 % Apply 1 application topically 2 (two) times daily. (Patient not taking: Reported on 12/02/2018)   . EPINEPHrine 0.3 mg/0.3 mL IJ SOAJ injection Inject 0.3 mLs (0.3 mg total) into the muscle once as needed for up to 1 dose. (Patient not taking: Reported on 12/02/2018)   . loratadine (CLARITIN) 10 MG tablet Take 1 tablet (10 mg total) by mouth daily. (Patient not taking: Reported on 12/02/2018)   . nitroGLYCERIN (NITROSTAT) 0.4 MG SL tablet Place 1 tablet (0.4 mg total) under the tongue every 5 (five) minutes as needed for chest pain. (Patient not taking: Reported on 12/02/2018)   . omeprazole (PRILOSEC) 20 MG capsule TAKE 1 CAPSULE BY MOUTH ONCE DAILY (Patient not taking: Reported on 12/02/2018)   . PROAIR HFA 108 (90 Base) MCG/ACT inhaler INHALE 2 PUFFS BY MOUTH EVERY 6 HOURS IF NEEDED FOR COUGH OR WHEEZING. (Patient not taking: Reported on 12/02/2018)    No facility-administered encounter medications on file as of 12/02/2018.      Goals Addressed            This Visit's Progress     Patient Stated   . PharmD "I want  to work on my diabetes" (pt-stated)       Current Barriers:  . Diabetes: uncontrolled though improved, most recent A1c 7.1% today . Current antihyperglycemic regimen: metformin 500 mg BID, Invokana 300 mg daily . Current meal patterns: Reports that he has been cutting back on sodas somewhat. Did meet with dietician to discuss PKU and diabetic friendly diet  . Current blood glucose readings: o 7 day average: 233; 4 readings o 14 day average: 216; 9 readings o 30 day average: 211; 21 readings . Cardiovascular risk reduction: o Current hypertensive regimen: lisinopril 20 mg daily; does not have a BP cuff  currently, would like one  o Current hyperlipidemia regimen: simvastatin 40 mg daily; fenofibrate 48 mg daily; TG much better control <150 now; o Notes episodes of chest pain, notes it occurred right after he was mowing his lawn   Pharmacist Clinical Goal(s):  [name] Over the next 90 days, patient with work with PharmD and primary care provider to address optimized medication management  Interventions: . Congratulated patient on improvement in A1c and triglycerides.  . Patient notes he is taking his morning medications regularly ~9 am every day. He notes compliance. Moving forward, will evaluate for interested in getting medications pill packed . Will investigate if patient has OTC benefits for BP cuff.    Patient Self Care Activities:  . Patient will check blood glucose daily , document, and provide at future appointments . Patient will take medications as prescribed . Patient will report any questions or concerns to provider   Please see past updates related to this goal by clicking on the "Past Updates" button in the selected goal         Plan:  - CCM team will outreach this patient for continued support in the next 4-5 weeks  Catie Darnelle Maffucci, PharmD Clinical Pharmacist [place] [phone]

## 2018-12-02 NOTE — Assessment & Plan Note (Signed)
Will have patient return to cardiology for follow-up, has not been seen in one year and was to follow-up if any return of CP.

## 2018-12-03 ENCOUNTER — Ambulatory Visit: Payer: Self-pay | Admitting: Pharmacist

## 2018-12-03 DIAGNOSIS — I10 Essential (primary) hypertension: Secondary | ICD-10-CM

## 2018-12-03 LAB — COMPREHENSIVE METABOLIC PANEL
ALT: 30 IU/L (ref 0–44)
AST: 33 IU/L (ref 0–40)
Albumin/Globulin Ratio: 1.9 (ref 1.2–2.2)
Albumin: 4.3 g/dL (ref 3.8–4.9)
Alkaline Phosphatase: 127 IU/L — ABNORMAL HIGH (ref 39–117)
BUN/Creatinine Ratio: 9 (ref 9–20)
BUN: 14 mg/dL (ref 6–24)
Bilirubin Total: 0.4 mg/dL (ref 0.0–1.2)
CO2: 21 mmol/L (ref 20–29)
Calcium: 9.2 mg/dL (ref 8.7–10.2)
Chloride: 108 mmol/L — ABNORMAL HIGH (ref 96–106)
Creatinine, Ser: 1.52 mg/dL — ABNORMAL HIGH (ref 0.76–1.27)
GFR calc Af Amer: 59 mL/min/{1.73_m2} — ABNORMAL LOW (ref >59)
GFR calc non Af Amer: 51 mL/min/{1.73_m2} — ABNORMAL LOW (ref >59)
Globulin, Total: 2.3 g/dL (ref 1.5–4.5)
Glucose: 167 mg/dL — ABNORMAL HIGH (ref 65–99)
Potassium: 4.4 mmol/L (ref 3.5–5.2)
Sodium: 142 mmol/L (ref 134–144)
Total Protein: 6.6 g/dL (ref 6.0–8.5)

## 2018-12-03 LAB — VITAMIN D 25 HYDROXY (VIT D DEFICIENCY, FRACTURES): Vit D, 25-Hydroxy: 42.2 ng/mL (ref 30.0–100.0)

## 2018-12-04 ENCOUNTER — Ambulatory Visit: Payer: Self-pay | Admitting: *Deleted

## 2018-12-04 ENCOUNTER — Telehealth: Payer: Self-pay | Admitting: Nurse Practitioner

## 2018-12-04 DIAGNOSIS — E700 Classical phenylketonuria: Secondary | ICD-10-CM

## 2018-12-04 DIAGNOSIS — E701 Other hyperphenylalaninemias: Secondary | ICD-10-CM

## 2018-12-04 NOTE — Chronic Care Management (AMB) (Signed)
Chronic Care Management   Follow Up Note   12/04/2018 Name: [name]. MRN: [id number] DOB: 07-Jul-1960  Referred by: [name], NP Reason for referral : Care Coordination [facility] Dietician)   [name]. is a 58 y.o. year old male who is a primary care patient of Cannady, Barbaraann Faster, NP. The CCM team was consulted for assistance with chronic disease management and care coordination needs.    Review of patient status, including review of consultants reports, relevant laboratory and other test results, and collaboration with appropriate care team members and the patient's provider was performed as part of comprehensive patient evaluation and provision of chronic care management services.    SDOH (Social Determinants of Health) screening performed today: None. See Care Plan for related entries.   Advanced Directives Status: N See Care Plan and Vynca application for related entries.  Outpatient Encounter Medications as of 12/04/2018  Medication Sig Note  . canagliflozin (INVOKANA) 300 MG TABS tablet Take 1 tablet (300 mg total) by mouth daily before breakfast.   . Cholecalciferol (VITAMIN D3) 1.25 MG (50000 UT) CAPS Take 50,000 Units by mouth once a week.   . clobetasol cream (TEMOVATE) 8.33 % Apply 1 application topically 2 (two) times daily. (Patient not taking: Reported on 12/02/2018)   . EPINEPHrine 0.3 mg/0.3 mL IJ SOAJ injection Inject 0.3 mLs (0.3 mg total) into the muscle once as needed for up to 1 dose. (Patient not taking: Reported on 12/02/2018)   . fenofibrate (TRICOR) 48 MG tablet TAKE 1 TABLET BY MOUTH ONCE DAILY   . fluticasone (FLOVENT HFA) 110 MCG/ACT inhaler Inhale 1 puff into the lungs 2 (two) times daily. 12/02/2018: Taking QAM  . gabapentin (NEURONTIN) 300 MG capsule TAKE 1 CAPSULE BY MOUTH AT BEDTIME FOR ONE WEEK  THEN TAKE ONE CAPSULE TWICE DAILY   . lisinopril (ZESTRIL) 20 MG tablet TAKE 1 TABLET BY MOUTH ONCE DAILY   . loratadine (CLARITIN) 10  MG tablet Take 1 tablet (10 mg total) by mouth daily. (Patient not taking: Reported on 12/02/2018)   . meloxicam (MOBIC) 15 MG tablet Take 1 tablet (15 mg total) by mouth daily.   . montelukast (SINGULAIR) 10 MG tablet TAKE 1 TABLET BY MOUTH ONCE EVERY EVENING AT BEDTIME.   . nitroGLYCERIN (NITROSTAT) 0.4 MG SL tablet Place 1 tablet (0.4 mg total) under the tongue every 5 (five) minutes as needed for chest pain. (Patient not taking: Reported on 12/02/2018)   . omeprazole (PRILOSEC) 20 MG capsule TAKE 1 CAPSULE BY MOUTH ONCE DAILY (Patient not taking: Reported on 12/02/2018)   . OneTouch Delica Lancets 82N MISC TEST ONCE A DAY.   Glory Rosebush DELICA LANCETS FINE MISC TEST once daily   . ONETOUCH ULTRA test strip TEST ONCE DAILY   . PROAIR HFA 108 (90 Base) MCG/ACT inhaler INHALE 2 PUFFS BY MOUTH EVERY 6 HOURS IF NEEDED FOR COUGH OR WHEEZING. (Patient not taking: Reported on 12/02/2018)   . simvastatin (ZOCOR) 40 MG tablet TAKE 1 TABLET BY MOUTH ONCE DAILY    No facility-administered encounter medications on file as of 12/04/2018.      Goals Addressed            This Visit's Progress   . RN-I need help with my Diet (pt-stated)       Current Barriers:  [name] Knowledge Deficits related to diabetic diet and PKU diet  . Film/video editor.  . Literacy barriers . Cognitive Deficits . Unable to travel to  specialists outside of [facility] related to law enforced electronic monitoring.   Nurse Case Manager Clinical Goal(s):  [name] Over the next 90 days, patient will work with [facility]  to address needs related to learning what patient can eat on Diabetic and PKU diet . Over the next 90 days, patient will work with Administrator, sports (community agency) to learn what he can eat on a diabetic and PKU diet.  Interventions:  . Discussed plans with patient for ongoing care management follow up and provided patient with direct contact information for care management team . There are 2 RDs from [facility]  system that are specifically dedicated to supporting all PKU patients in the state of [place].  [name] [name] .[email or web address])  . Alex [address] [email or web address]) . Plan to speak to [facility] and to fax over a referral to [phone] and call [phone] to register the patient.  . Emailed both [employer], and left a message for [name] to return my call about this patient. Requesting more information on referral process. . Placed a call to register the patient as instructed but the rep at the call center was unable to complete this request.     Patient Self Care Activities:  . Patient needs continued support in his efforts towards adherence to general PKU guidelines within the recommendations for a diabetic diet.    Please see past updates related to this goal by clicking on the "Past Updates" button in the selected goal          The patient has been provided with contact information for the care management team and has been advised to call with any health related questions or concerns.   [name] Yehya Brendle RN, BSN Nurse Case Editor, [facility] Care [facility]  [phone]) Business Mobile

## 2018-12-04 NOTE — Telephone Encounter (Signed)
Spoke to patient's wife on phone, she is on [place].  Discussed his current kidney function with her, as is showing decline with addition of Metformin.  Have recommended he stop taking Metformin.  Discussed possibly trying Glipizide, as he took for long period in past, but noted sulfa allergy on his chart.  She reports when he took Glipizide he did report more abdominal pain and overall not feeling well, but they did not think about sulfa issues.  At this time will continue Invokana and diet focus, as he is losing some weight and is focusing on diet.  Will have him return to office in 4 weeks to see how sugars are doing.  May consider a low dose of Trulicity and monitor for ADR.

## 2018-12-04 NOTE — Chronic Care Management (AMB) (Signed)
Chronic Care Management   Follow Up Note   12/04/2018 Name: [name]. MRN: [id number] DOB: Oct 28, 1960  Referred by: Alex Lick, NP Reason for referral : Chronic Care Management (Medication Management)   [name]. is a 58 y.o. year old male who is a primary care patient of [name], Alex Faster, NP. The CCM team was consulted for assistance with chronic disease management and care coordination needs.    Care coordination completed today  Review of patient status, including review of consultants reports, relevant laboratory and other test results, and collaboration with appropriate care team members and the patient's provider was performed as part of comprehensive patient evaluation and provision of chronic care management services.    SDOH (Social Determinants of Health) screening performed today: Financial Strain . See Care Plan for related entries.   Advanced Directives Status: N See Care Plan and Vynca application for related entries.  Outpatient Encounter Medications as of 12/03/2018  Medication Sig Note  . canagliflozin (INVOKANA) 300 MG TABS tablet Take 1 tablet (300 mg total) by mouth daily before breakfast.   . Cholecalciferol (VITAMIN D3) 1.25 MG (50000 UT) CAPS Take 50,000 Units by mouth once a week.   . clobetasol cream (TEMOVATE) 7.67 % Apply 1 application topically 2 (two) times daily. (Patient not taking: Reported on 12/02/2018)   . EPINEPHrine 0.3 mg/0.3 mL IJ SOAJ injection Inject 0.3 mLs (0.3 mg total) into the muscle once as needed for up to 1 dose. (Patient not taking: Reported on 12/02/2018)   . fenofibrate (TRICOR) 48 MG tablet TAKE 1 TABLET BY MOUTH ONCE DAILY   . fluticasone (FLOVENT HFA) 110 MCG/ACT inhaler Inhale 1 puff into the lungs 2 (two) times daily. 12/02/2018: Taking QAM  . gabapentin (NEURONTIN) 300 MG capsule TAKE 1 CAPSULE BY MOUTH AT BEDTIME FOR ONE WEEK  THEN TAKE ONE CAPSULE TWICE DAILY   . lisinopril (ZESTRIL) 20 MG tablet  TAKE 1 TABLET BY MOUTH ONCE DAILY   . loratadine (CLARITIN) 10 MG tablet Take 1 tablet (10 mg total) by mouth daily. (Patient not taking: Reported on 12/02/2018)   . meloxicam (MOBIC) 15 MG tablet Take 1 tablet (15 mg total) by mouth daily.   . montelukast (SINGULAIR) 10 MG tablet TAKE 1 TABLET BY MOUTH ONCE EVERY EVENING AT BEDTIME.   . nitroGLYCERIN (NITROSTAT) 0.4 MG SL tablet Place 1 tablet (0.4 mg total) under the tongue every 5 (five) minutes as needed for chest pain. (Patient not taking: Reported on 12/02/2018)   . omeprazole (PRILOSEC) 20 MG capsule TAKE 1 CAPSULE BY MOUTH ONCE DAILY (Patient not taking: Reported on 12/02/2018)   . OneTouch Delica Lancets 34L MISC TEST ONCE A DAY.   Glory Rosebush DELICA LANCETS FINE MISC TEST once daily   . ONETOUCH ULTRA test strip TEST ONCE DAILY   . PROAIR HFA 108 (90 Base) MCG/ACT inhaler INHALE 2 PUFFS BY MOUTH EVERY 6 HOURS IF NEEDED FOR COUGH OR WHEEZING. (Patient not taking: Reported on 12/02/2018)   . simvastatin (ZOCOR) 40 MG tablet TAKE 1 TABLET BY MOUTH ONCE DAILY   . [DISCONTINUED] metFORMIN (GLUCOPHAGE) 500 MG tablet Take 1 tablet (500 mg total) by mouth 2 (two) times daily with a meal.    No facility-administered encounter medications on file as of 12/03/2018.      Goals Addressed            This Visit's Progress     Patient Stated   . PharmD "I want to  work on my diabetes" (pt-stated)       Current Barriers:  . Diabetes: uncontrolled though improved, most recent A1c 7.1% today . Current antihyperglycemic regimen: metformin 500 mg BID, Invokana 300 mg daily . Current meal patterns: Reports that he has been cutting back on sodas somewhat. Did meet with dietician to discuss PKU and diabetic friendly diet  . Current blood glucose readings: o 7 day average: 233; 4 readings o 14 day average: 216; 9 readings o 30 day average: 211; 21 readings . Cardiovascular risk reduction: o Current hypertensive regimen: lisinopril 20 mg daily; does not  have a BP cuff currently, would like one  o Current hyperlipidemia regimen: simvastatin 40 mg daily; fenofibrate 48 mg daily; TG much better control <150 now; o Notes episodes of chest pain, notes it occurred right after he was mowing his lawn   Pharmacist Clinical Goal(s):  [name] Over the next 90 days, patient with work with PharmD and primary care provider to address optimized medication management  Interventions: . Congratulated patient on improvement in A1c and triglycerides.  . Patient notes he is taking his morning medications regularly ~9 am every day. He notes compliance. Moving forward, will evaluate for interested in getting medications pill packed . Attempted to contact [employer] to inquire if the patient has Over The Counter benefits, and if so, if the catalog could be mailed to the patient. The representative I spoke with was unable to help me. Through searching the website and looking at his [facility] 1, he does have OTC/Wellness benefits, and should have $50/quarter to spend. Will discuss this with patient and provide written instructions for this for his wife at his next appointment with Alex Guarneri, NP  Patient Self Care Activities:  . Patient will check blood glucose daily , document, and provide at future appointments . Patient will take medications as prescribed . Patient will report any questions or concerns to provider   Please see past updates related to this goal by clicking on the "Past Updates" button in the selected goal         Plan:  - CCM team will outreach this patient in the next 4-6 weeks for continued medication management support  Catie Darnelle Maffucci, PharmD Clinical Pharmacist [place] [phone]

## 2018-12-04 NOTE — Patient Instructions (Signed)
Visit Information  Goals Addressed            This Visit's Progress     Patient Stated   . PharmD "I want to work on my diabetes" (pt-stated)       Current Barriers:  . Diabetes: uncontrolled though improved, most recent A1c 7.1% today . Current antihyperglycemic regimen: metformin 500 mg BID, Invokana 300 mg daily . Current meal patterns: Reports that he has been cutting back on sodas somewhat. Did meet with dietician to discuss PKU and diabetic friendly diet  . Current blood glucose readings: o 7 day average: 233; 4 readings o 14 day average: 216; 9 readings o 30 day average: 211; 21 readings . Cardiovascular risk reduction: o Current hypertensive regimen: lisinopril 20 mg daily; does not have a BP cuff currently, would like one  o Current hyperlipidemia regimen: simvastatin 40 mg daily; fenofibrate 48 mg daily; TG much better control <150 now; o Notes episodes of chest pain, notes it occurred right after he was mowing his lawn   Pharmacist Clinical Goal(s):  [name] Over the next 90 days, patient with work with PharmD and primary care provider to address optimized medication management  Interventions: . Congratulated patient on improvement in A1c and triglycerides.  . Patient notes he is taking his morning medications regularly ~9 am every day. He notes compliance. Moving forward, will evaluate for interested in getting medications pill packed . Attempted to contact [employer] to inquire if the patient has Over The Counter benefits, and if so, if the catalog could be mailed to the patient. The representative I spoke with was unable to help me. Through searching the website and looking at his [facility] 1, he does have OTC/Wellness benefits, and should have $50/quarter to spend. Will discuss this with patient and provide written instructions for this for his wife at his next appointment with Marnee Guarneri, NP  Patient Self Care Activities:  . Patient will check blood  glucose daily , document, and provide at future appointments . Patient will take medications as prescribed . Patient will report any questions or concerns to provider   Please see past updates related to this goal by clicking on the "Past Updates" button in the selected goal         The patient verbalized understanding of instructions provided today and declined a print copy of patient instruction materials.   Plan:  - CCM team will outreach this patient in the next 4-6 weeks for continued medication management support  Catie Darnelle Maffucci, PharmD Clinical Pharmacist [place] [phone]

## 2018-12-04 NOTE — Telephone Encounter (Signed)
Called and scheduled patient's appointment for 12/31/18.

## 2018-12-08 ENCOUNTER — Other Ambulatory Visit: Payer: Self-pay | Admitting: Nurse Practitioner

## 2018-12-12 ENCOUNTER — Ambulatory Visit: Payer: Self-pay | Admitting: *Deleted

## 2018-12-12 DIAGNOSIS — E700 Classical phenylketonuria: Secondary | ICD-10-CM | POA: Diagnosis not present

## 2018-12-12 DIAGNOSIS — E701 Other hyperphenylalaninemias: Secondary | ICD-10-CM

## 2018-12-12 DIAGNOSIS — E119 Type 2 diabetes mellitus without complications: Secondary | ICD-10-CM

## 2018-12-12 NOTE — Chronic Care Management (AMB) (Signed)
Chronic Care Management   Follow Up Note   12/12/2018 Name: [name]. MRN: [id number] DOB: 04/22/1960  Referred by: [name], NP Reason for referral : No chief complaint on file.   Alex Alfred Jakyrie Totherow. is a 58 y.o. year old male who is a primary care patient of Cannady, Barbaraann Faster, NP. The CCM team was consulted for assistance with chronic disease management and care coordination needs.    Review of patient status, including review of consultants reports, relevant laboratory and other test results, and collaboration with appropriate care team members and the patient's provider was performed as part of comprehensive patient evaluation and provision of chronic care management services.    SDOH (Social Determinants of Health) screening performed today: None. See Care Plan for related entries.   Advanced Directives Status: N See Care Plan and Vynca application for related entries.  Outpatient Encounter Medications as of 12/12/2018  Medication Sig Note  . canagliflozin (INVOKANA) 300 MG TABS tablet Take 1 tablet (300 mg total) by mouth daily before breakfast.   . Cholecalciferol (VITAMIN D3) 1.25 MG (50000 UT) CAPS Take 50,000 Units by mouth once a week.   . clobetasol cream (TEMOVATE) 6.64 % Apply 1 application topically 2 (two) times daily. (Patient not taking: Reported on 12/02/2018)   . EPINEPHrine 0.3 mg/0.3 mL IJ SOAJ injection Inject 0.3 mLs (0.3 mg total) into the muscle once as needed for up to 1 dose. (Patient not taking: Reported on 12/02/2018)   . fenofibrate (TRICOR) 48 MG tablet TAKE 1 TABLET BY MOUTH ONCE DAILY   . fluticasone (FLOVENT HFA) 110 MCG/ACT inhaler Inhale 1 puff into the lungs 2 (two) times daily. 12/02/2018: Taking QAM  . gabapentin (NEURONTIN) 300 MG capsule TAKE 1 CAPSULE BY MOUTH AT BEDTIME FOR ONE WEEK  THEN TAKE ONE CAPSULE TWICE DAILY   . lisinopril (ZESTRIL) 20 MG tablet TAKE 1 TABLET BY MOUTH ONCE DAILY   . loratadine (CLARITIN) 10 MG  tablet Take 1 tablet (10 mg total) by mouth daily. (Patient not taking: Reported on 12/02/2018)   . meloxicam (MOBIC) 15 MG tablet TAKE 1 TABLET BY MOUTH ONCE DAILY   . montelukast (SINGULAIR) 10 MG tablet TAKE 1 TABLET BY MOUTH ONCE EVERY EVENING AT BEDTIME.   . nitroGLYCERIN (NITROSTAT) 0.4 MG SL tablet Place 1 tablet (0.4 mg total) under the tongue every 5 (five) minutes as needed for chest pain. (Patient not taking: Reported on 12/02/2018)   . omeprazole (PRILOSEC) 20 MG capsule TAKE 1 CAPSULE BY MOUTH ONCE DAILY (Patient not taking: Reported on 12/02/2018)   . OneTouch Delica Lancets 40H MISC TEST ONCE A DAY.   Glory Rosebush DELICA LANCETS FINE MISC TEST once daily   . ONETOUCH ULTRA test strip TEST ONCE DAILY   . PROAIR HFA 108 (90 Base) MCG/ACT inhaler INHALE 2 PUFFS BY MOUTH EVERY 6 HOURS IF NEEDED FOR COUGH OR WHEEZING. (Patient not taking: Reported on 12/02/2018)   . simvastatin (ZOCOR) 40 MG tablet TAKE 1 TABLET BY MOUTH ONCE DAILY    No facility-administered encounter medications on file as of 12/12/2018.      Goals Addressed            This Visit's Progress   . RN-I need help with my Diet (pt-stated)       Current Barriers:  [name] Knowledge Deficits related to diabetic diet and PKU diet  . Film/video editor.  . Literacy barriers . Cognitive Deficits . Unable to travel to specialists  outside of [facility] related to law enforced electronic monitoring.   Nurse Case Manager Clinical Goal(s):  [name] Over the next 90 days, patient will work with [facility]  to address needs related to learning what patient can eat on Diabetic and PKU diet . Over the next 90 days, patient will work with Administrator, sports (community agency) to learn what he can eat on a diabetic and PKU diet.  Interventions:  . Collaborated with [employer] via email regarding referral process. Norlene Campbell stated patient was in the system so I did not need to call and register him. Only faxe clinic notes and request  to be seen for PKU.  [name] Norlene Campbell [place].Ramsey_0 .[email or web address])  . Christine [address] [place].Hall_1 .[email or web address]) . Faxed over a referral and clinic notes requesting assistance for patient with diabetes diet and PKU diet to [phone] attn: Norlene Campbell.    Patient Self Care Activities:  . Patient needs continued support in his efforts towards adherence to general PKU guidelines within the recommendations for a diabetic diet.    Please see past updates related to this goal by clicking on the "Past Updates" button in the selected goal          The care management team will reach out to the patient again over the next 30 days.   [name] Beverley Allender RN, BSN Nurse Case Editor, commissioning Family Practice/[facility] Care Management  [phone]) Business Mobile

## 2018-12-14 NOTE — Patient Instructions (Signed)
Thank you allowing the Chronic Care Management Team to be a part of your care! It was a pleasure speaking with you today!   CCM (Chronic Care Management) Team   Sequoia Witz RN, BSN Nurse Care Coordinator  [phone]  Catie [facility] PharmD  Clinical Pharmacist  [phone]  Eula Fried LCSW Clinical Social Worker [phone]  Goals Addressed            This Visit's Progress   . RN-I need help with my Diet (pt-stated)       Current Barriers:  [name] Knowledge Deficits related to diabetic diet and PKU diet  . Film/video editor.  . Literacy barriers . Cognitive Deficits . Unable to travel to specialists outside of [facility] related to law enforced electronic monitoring.   Nurse Case Manager Clinical Goal(s):  [name] Over the next 90 days, patient will work with [facility]  to address needs related to learning what patient can eat on Diabetic and PKU diet . Over the next 90 days, patient will work with Administrator, sports (community agency) to learn what he can eat on a diabetic and PKU diet.  Interventions:  . Collaborated with [employer] via email regarding referral process. Norlene Campbell stated patient was in the system so I did not need to call and register him. Only faxe clinic notes and request to be seen for PKU.  [name] Norlene Campbell [place].Ramsey_0 .[email or web address])  . Christine [address] [place].Hall_1 .[email or web address]) . Faxed over a referral and clinic notes requesting assistance for patient with diabetes diet and PKU diet to [phone] attn: Norlene Campbell.    Patient Self Care Activities:  . Patient needs continued support in his efforts towards adherence to general PKU guidelines within the recommendations for a diabetic diet.    Please see past updates related to this goal by clicking on the "Past Updates" button in the selected goal         The patient verbalized understanding of instructions provided today and declined a print copy of patient instruction  materials.   The patient has been provided with contact information for the care management team and has been advised to call with any health related questions or concerns.

## 2018-12-15 ENCOUNTER — Ambulatory Visit: Payer: Self-pay | Admitting: Licensed Clinical Social Worker

## 2018-12-15 DIAGNOSIS — I10 Essential (primary) hypertension: Secondary | ICD-10-CM | POA: Diagnosis not present

## 2018-12-15 DIAGNOSIS — G894 Chronic pain syndrome: Secondary | ICD-10-CM

## 2018-12-15 NOTE — Chronic Care Management (AMB) (Signed)
Chronic Care Management    Clinical Social Work Follow Up Note  12/15/2018 Name: [name]. MRN: [id number] DOB: [birth year]  Alex Metro. is a 58 y.o. year old male who is a primary care patient of Cannady, Barbaraann Faster, NP. The CCM team was consulted for assistance with [name] .   Review of patient status, including review of consultants reports, other relevant assessments, and collaboration with appropriate care team members and the patient's provider was performed as part of comprehensive patient evaluation and provision of chronic care management services.    SDOH (Social Determinants of Health) screening performed today: Physical Activity. See Care Plan for related entries.   Outpatient Encounter Medications as of 12/15/2018  Medication Sig Note  . canagliflozin (INVOKANA) 300 MG TABS tablet Take 1 tablet (300 mg total) by mouth daily before breakfast.   . Cholecalciferol (VITAMIN D3) 1.25 MG (50000 UT) CAPS Take 50,000 Units by mouth once a week.   . clobetasol cream (TEMOVATE) 2.94 % Apply 1 application topically 2 (two) times daily. (Patient not taking: Reported on 12/02/2018)   . EPINEPHrine 0.3 mg/0.3 mL IJ SOAJ injection Inject 0.3 mLs (0.3 mg total) into the muscle once as needed for up to 1 dose. (Patient not taking: Reported on 12/02/2018)   . fenofibrate (TRICOR) 48 MG tablet TAKE 1 TABLET BY MOUTH ONCE DAILY   . fluticasone (FLOVENT HFA) 110 MCG/ACT inhaler Inhale 1 puff into the lungs 2 (two) times daily. 12/02/2018: Taking QAM  . gabapentin (NEURONTIN) 300 MG capsule TAKE 1 CAPSULE BY MOUTH AT BEDTIME FOR ONE WEEK  THEN TAKE ONE CAPSULE TWICE DAILY   . lisinopril (ZESTRIL) 20 MG tablet TAKE 1 TABLET BY MOUTH ONCE DAILY   . loratadine (CLARITIN) 10 MG tablet Take 1 tablet (10 mg total) by mouth daily. (Patient not taking: Reported on 12/02/2018)   . meloxicam (MOBIC) 15 MG tablet TAKE 1 TABLET BY MOUTH ONCE DAILY   . montelukast (SINGULAIR) 10 MG  tablet TAKE 1 TABLET BY MOUTH ONCE EVERY EVENING AT BEDTIME.   . nitroGLYCERIN (NITROSTAT) 0.4 MG SL tablet Place 1 tablet (0.4 mg total) under the tongue every 5 (five) minutes as needed for chest pain. (Patient not taking: Reported on 12/02/2018)   . omeprazole (PRILOSEC) 20 MG capsule TAKE 1 CAPSULE BY MOUTH ONCE DAILY (Patient not taking: Reported on 12/02/2018)   . OneTouch Delica Lancets 76L MISC TEST ONCE A DAY.   Glory Rosebush DELICA LANCETS FINE MISC TEST once daily   . ONETOUCH ULTRA test strip TEST ONCE DAILY   . PROAIR HFA 108 (90 Base) MCG/ACT inhaler INHALE 2 PUFFS BY MOUTH EVERY 6 HOURS IF NEEDED FOR COUGH OR WHEEZING. (Patient not taking: Reported on 12/02/2018)   . simvastatin (ZOCOR) 40 MG tablet TAKE 1 TABLET BY MOUTH ONCE DAILY    No facility-administered encounter medications on file as of 12/15/2018.      Goals Addressed    . SW-"I want to improve my health and self-care" (pt-stated)       Current Barriers:  . Financial constraints . Limited social support . Level of care concerns . ADL IADL limitations . Literacy concerns . Cognitive Deficits . Lacks knowledge of community resource: available financial support resources within the area as well as socialization opportunities within the area  Clinical Social Work Clinical Goal(s):  [name] Over the next 120 days, client will work with SW to address concerns related to improving self-care and implementing it whenever able  Interventions: . Patient interviewed and appropriate assessments performed . Provided mental health counseling with regard to managing everyday stressors and implementing appropriate self-care techniques in order to improve overall health. LCSW provided reflective listening and implemented appropriate interventions to help suppport patient and her emotional needs  . Provided patient with information about OTC benefits that he is uses per quarter. Patient's spouse reports that she has started to order diabetic  lotion for patient and this is helping him immensely. . Patient reports trying to implement appropriate self-care by exercise and mowing his yard. Patient reports ongoing chronic pain with his knees and side. He reports that he completed a X-ray.  . Discussed plans with patient for ongoing care management follow up and provided patient with direct contact information for care management team . Advised patient to contact CCM embedded practice providers and PCP if any urgent concerns arise. [name] LCSW completed referral for a wheelchair ramp due to ongoing mobility concerns. [name] and her spouse completed home visit and assessment with family. Ramp installation process has been completed.  . Patient reports ongoing chronic pain and difficulty managing this without becoming irritated or ill. Patient was alert, oriented. Patient denies anxiety at this time. Discussed coping skills. SW used active and reflective listening, validated patient's feelings/concerns, and provided emotional support. . Assisted patient/caregiver with obtaining information about health plan benefits . Provided education to patient/caregiver regarding level of care options.  Patient Self Care Activities:  . Attends all scheduled provider appointments . Calls provider office for new concerns or questions  Please see past updates related to this goal by clicking on the "Past Updates" button in the selected goal      Follow Up Plan: SW will follow up with patient by phone over the next quarter  Eula Fried, [place], MSW, [name] Phone: [phone]

## 2018-12-15 NOTE — Telephone Encounter (Signed)
Duplicate

## 2018-12-26 ENCOUNTER — Other Ambulatory Visit: Payer: Self-pay | Admitting: Nurse Practitioner

## 2018-12-31 ENCOUNTER — Ambulatory Visit: Payer: Self-pay | Admitting: *Deleted

## 2018-12-31 ENCOUNTER — Ambulatory Visit: Payer: Self-pay | Admitting: Nurse Practitioner

## 2018-12-31 DIAGNOSIS — E119 Type 2 diabetes mellitus without complications: Secondary | ICD-10-CM

## 2018-12-31 DIAGNOSIS — E701 Other hyperphenylalaninemias: Secondary | ICD-10-CM

## 2018-12-31 DIAGNOSIS — I10 Essential (primary) hypertension: Secondary | ICD-10-CM

## 2018-12-31 DIAGNOSIS — E700 Classical phenylketonuria: Secondary | ICD-10-CM

## 2019-01-04 NOTE — Patient Instructions (Signed)
Thank you allowing the Chronic Care Management Team to be a part of your care! It was a pleasure speaking with you today!   CCM (Chronic Care Management) Team   Brookelin Felber RN, BSN Nurse Care Coordinator  [phone]  Catie [facility] PharmD  Clinical Pharmacist  [phone]  Eula Fried LCSW Clinical Social Worker [phone]  Goals Addressed            This Visit's Progress   . RN-I need help with my Diet (pt-stated)       Current Barriers:  [name] Knowledge Deficits related to diabetic diet and PKU diet  . Film/video editor.  . Literacy barriers . Cognitive Deficits . Unable to travel to specialists outside of [facility] related to law enforced electronic monitoring.   Nurse Case Manager Clinical Goal(s):  [name] Over the next 90 days, patient will work with [facility]  to address needs related to learning what patient can eat on Diabetic and PKU diet . Over the next 90 days, patient will work with Administrator, sports (community agency) to learn what he can eat on a diabetic and PKU diet.  Interventions:  . Collaborated with [employer] via email regarding referral process. Norlene Campbell stated patient was in the system so I did not need to call and register him. Only faxe clinic notes and request to be seen for PKU.  [name] Norlene Campbell [place].Ramsey_0 .[email or web address])  . Christine [address] [place].Hall_1 .[email or web address]) . Email confirmation from [facility] patient's information has been received.  [name] Spouse states [facility] has contacted her but they left a message. When she has attempted to return their call they told her she can npt make an appointment because the patient is "too old".  . Email sent to request the best number for Patient's spouse to call:" We have received the paperwork and scheduling has tried to contact them several times to schedule but they don't answer. They can call scheduling at [phone] to make an appointment." . Plan to assist spouse with  this. . Patient states his blood sugars running in the 130's but was 243 this morning because he ate a lot last night.  . Patient reports he was able to get knee brace . Patient reports blood pressure today 139/93 p 68. Requested patient take and record blood pressures and sugars and bring to the next pcp visit on Nov 4.  . Spouse reports they do have  housing food, and transportation at this time.    Patient Self Care Activities:  . Patient needs continued support in his efforts towards adherence to general PKU guidelines within the recommendations for a diabetic diet.    Please see past updates related to this goal by clicking on the "Past Updates" button in the selected goal         The patient verbalized understanding of instructions provided today and declined a print copy of patient instruction materials.   The patient has been provided with contact information for the care management team and has been advised to call with any health related questions or concerns.

## 2019-01-04 NOTE — Chronic Care Management (AMB) (Signed)
Chronic Care Management   Follow Up Note   12/31/2018 Name: [name]. MRN: [id number] DOB: 1961-02-05  Referred by: [name], NP Reason for referral : Chronic Care Management (PKU, DM2) and Care Coordination (Dietician)   [name]. is a 58 y.o. year old male who is a primary care patient of Cannady, Barbaraann Faster, NP. The CCM team was consulted for assistance with chronic disease management and care coordination needs.    Review of patient status, including review of consultants reports, relevant laboratory and other test results, and collaboration with appropriate care team members and the patient's provider was performed as part of comprehensive patient evaluation and provision of chronic care management services.    SDOH (Social Determinants of Health) screening performed today: None. See Care Plan for related entries.   Advanced Directives Status: N See Care Plan and Vynca application for related entries.  Outpatient Encounter Medications as of 12/31/2018  Medication Sig Note   canagliflozin (INVOKANA) 300 MG TABS tablet Take 1 tablet (300 mg total) by mouth daily before breakfast.    Cholecalciferol (VITAMIN D3) 1.25 MG (50000 UT) CAPS Take 50,000 Units by mouth once a week.    clobetasol cream (TEMOVATE) 5.46 % Apply 1 application topically 2 (two) times daily. (Patient not taking: Reported on 12/02/2018)    EPINEPHrine 0.3 mg/0.3 mL IJ SOAJ injection Inject 0.3 mLs (0.3 mg total) into the muscle once as needed for up to 1 dose. (Patient not taking: Reported on 12/02/2018)    fenofibrate (TRICOR) 48 MG tablet TAKE 1 TABLET BY MOUTH ONCE DAILY    fluticasone (FLOVENT HFA) 110 MCG/ACT inhaler Inhale 1 puff into the lungs 2 (two) times daily. 12/02/2018: Taking QAM   gabapentin (NEURONTIN) 300 MG capsule TAKE 1 CAPSULE BY MOUTH AT BEDTIME FOR ONE WEEK  THEN TAKE ONE CAPSULE TWICE DAILY    lisinopril (ZESTRIL) 20 MG tablet TAKE 1 TABLET BY MOUTH ONCE  DAILY    loratadine (CLARITIN) 10 MG tablet Take 1 tablet (10 mg total) by mouth daily. (Patient not taking: Reported on 12/02/2018)    meloxicam (MOBIC) 15 MG tablet TAKE 1 TABLET BY MOUTH ONCE DAILY    montelukast (SINGULAIR) 10 MG tablet TAKE 1 TABLET BY MOUTH ONCE EVERY EVENING AT BEDTIME.    nitroGLYCERIN (NITROSTAT) 0.4 MG SL tablet Place 1 tablet (0.4 mg total) under the tongue every 5 (five) minutes as needed for chest pain. (Patient not taking: Reported on 12/02/2018)    omeprazole (PRILOSEC) 20 MG capsule TAKE 1 CAPSULE BY MOUTH ONCE DAILY (Patient not taking: Reported on 2/70/3500)    OneTouch Delica Lancets 93G MISC TEST ONCE A DAY.    ONETOUCH DELICA LANCETS FINE MISC TEST once daily    ONETOUCH ULTRA test strip TEST ONCE DAILY    PROAIR HFA 108 (90 Base) MCG/ACT inhaler INHALE 2 PUFFS BY MOUTH EVERY 6 HOURS IF NEEDED FOR COUGH OR WHEEZING. (Patient not taking: Reported on 12/02/2018)    simvastatin (ZOCOR) 40 MG tablet TAKE 1 TABLET BY MOUTH ONCE DAILY    No facility-administered encounter medications on file as of 12/31/2018.      Goals Addressed            This Visit's Progress    RN-I need help with my Diet (pt-stated)       Current Barriers:   Knowledge Deficits related to diabetic diet and PKU diet   Financial Constraints.   Literacy barriers  Cognitive Deficits  Unable  to travel to specialists outside of [facility] related to law enforced electronic monitoring.   Nurse Case Manager Clinical Goal(s):   Over the next 90 days, patient will work with [facility]  to address needs related to learning what patient can eat on Diabetic and PKU diet  Over the next 90 days, patient will work with Administrator, sports (community agency) to learn what he can eat on a diabetic and PKU diet.  Interventions:   Collaborated with [employer] via email regarding referral process. Norlene Campbell stated patient was in the system so I did not need to call and register  him. Only faxe clinic notes and request to be seen for PKU.   Norlene Campbell [place].Ramsey_0 .[email or web address])   Lourdes Sledge [place].Hall_1 .[email or web address])  Email confirmation from [facility] patient's information has been received.   Spouse states [facility] has contacted her but they left a message. When she has attempted to return their call they told her she can npt make an appointment because the patient is "too old".   Email sent to request the best number for Patient's spouse to call:" We have received the paperwork and scheduling has tried to contact them several times to schedule but they don't answer. They can call scheduling at [phone] to make an appointment."  Plan to assist spouse with this.  Patient states his blood sugars running in the 130's but was 243 this morning because he ate a lot last night.   Patient reports he was able to get knee brace  Patient reports blood pressure today 139/93 p 68. Requested patient take and record blood pressures and sugars and bring to the next pcp visit on Nov 4.   Spouse reports they do have  housing food, and transportation at this time.    Patient Self Care Activities:   Patient needs continued support in his efforts towards adherence to general PKU guidelines within the recommendations for a diabetic diet.    Please see past updates related to this goal by clicking on the "Past Updates" button in the selected goal          The care management team will reach out to the patient again over the next 30 days.  The patient has been provided with contact information for the care management team and has been advised to call with any health related questions or concerns.    [name] Cordia Miklos RN, BSN Nurse Case Editor, commissioning Family Practice/[facility] Care Management  [phone]) Business Mobile

## 2019-01-06 ENCOUNTER — Telehealth: Payer: Self-pay

## 2019-01-13 ENCOUNTER — Telehealth: Payer: Self-pay

## 2019-01-13 ENCOUNTER — Other Ambulatory Visit: Payer: Self-pay | Admitting: Family Medicine

## 2019-01-13 DIAGNOSIS — R062 Wheezing: Secondary | ICD-10-CM

## 2019-01-13 DIAGNOSIS — E7800 Pure hypercholesterolemia, unspecified: Secondary | ICD-10-CM

## 2019-01-14 ENCOUNTER — Ambulatory Visit (INDEPENDENT_AMBULATORY_CARE_PROVIDER_SITE_OTHER): Payer: Medicare Other | Admitting: *Deleted

## 2019-01-14 DIAGNOSIS — E701 Other hyperphenylalaninemias: Secondary | ICD-10-CM

## 2019-01-14 DIAGNOSIS — E1140 Type 2 diabetes mellitus with diabetic neuropathy, unspecified: Secondary | ICD-10-CM

## 2019-01-14 DIAGNOSIS — E700 Classical phenylketonuria: Secondary | ICD-10-CM

## 2019-01-15 NOTE — Patient Instructions (Signed)
Thank you allowing the Chronic Care Management Team to be a part of your care! It was a pleasure speaking with you today!  CCM (Chronic Care Management) Team   Alex Garrels RN, BSN Nurse Care Coordinator  [phone]  [name] PharmD  Clinical Pharmacist  [phone]  Alex Fried LCSW Clinical Social Worker [phone]  Goals Addressed            This Visit's Progress   . RN-I need help with my Diet (pt-stated)       Current Barriers:  [name] Knowledge Deficits related to diabetic diet and PKU diet  . Film/video editor.  . Literacy barriers . Cognitive Deficits . Unable to travel to specialists outside of [facility] related to law enforced electronic monitoring.   Nurse Case Manager Clinical Goal(s):  [name] Over the next 90 days, patient will work with [name]  to address needs related to learning what patient can eat on Diabetic and PKU diet . Over the next 90 days, patient will work with Administrator, sports (community agency) to learn what he can eat on a diabetic and PKU diet.  Interventions:  . Collaborated with [name] via email regarding referral process. Alex Campbell stated patient was in the system so I did not need to call and register him. Only faxe clinic notes and request to be seen for PKU.  [name] [name]_0 .[email or web address])  . [name]_1 .[email or web address]) . Email confirmation from [name] patient's information has been received.  [name] Spouse states [name] has contacted her but they left a message. When she has attempted to return their call they told her she can npt make an appointment because the patient is "too old".  . Email sent to request the best number for Patient's spouse to call:" We have received the paperwork and scheduling has tried to contact them several times to schedule but they don't answer. They can call scheduling at [phone] to make an appointment." . Called [name] for patient, the scheduler  stated she could not see patient's referral. Transferred me to VM of genetics dept. Left a VM . Received a call back from [name] who left a VM stating for me to call scheduler.  [name] RNCM returned her call to make her aware of what patient's spouse stated, and the scheduler was unable to see the referral. [phone] . Again missed a call form [name] dietician, but she left a VM stating she was able to fix the issue with the referral and patient should be able to call in and make the appointment.  . Placed a call to patient's spouse and requested she call to make the appointment, reviewed to tell the scheduler patient needs televist: [phone]   Patient Self Care Activities:  . Patient needs continued support in his efforts towards adherence to general PKU guidelines within the recommendations for a diabetic diet.    Please see past updates related to this goal by clicking on the "Past Updates" button in the selected goal         The patient verbalized understanding of instructions provided today and declined a print copy of patient instruction materials.   The patient has been provided with contact information for the care management team and has been advised to call with any health related questions or concerns.

## 2019-01-15 NOTE — Chronic Care Management (AMB) (Signed)
Chronic Care Management   Follow Up Note   01/15/2019 Name: Ebony Rickel. MRN: [id number] DOB: 09-May-1960  Referred by: [name], NP Reason for referral : Chronic Care Management (HTN, PKU) and Care Coordination [facility] dietician)   Delrae Alfred Tyric Rodeheaver. is a 58 y.o. year old male who is a primary care patient of Cannady, Barbaraann Faster, NP. The CCM team was consulted for assistance with chronic disease management and care coordination needs.    Review of patient status, including review of consultants reports, relevant laboratory and other test results, and collaboration with appropriate care team members and the patient's provider was performed as part of comprehensive patient evaluation and provision of chronic care management services.    SDOH (Social Determinants of Health) screening performed today: Transportation. See Care Plan for related entries.   Advanced Directives Status: N See Care Plan and Vynca application for related entries.  Outpatient Encounter Medications as of 01/14/2019  Medication Sig Note   canagliflozin (INVOKANA) 300 MG TABS tablet Take 1 tablet (300 mg total) by mouth daily before breakfast.    Cholecalciferol (VITAMIN D3) 1.25 MG (50000 UT) CAPS Take 50,000 Units by mouth once a week.    clobetasol cream (TEMOVATE) 2.84 % Apply 1 application topically 2 (two) times daily. (Patient not taking: Reported on 12/02/2018)    EPINEPHrine 0.3 mg/0.3 mL IJ SOAJ injection Inject 0.3 mLs (0.3 mg total) into the muscle once as needed for up to 1 dose. (Patient not taking: Reported on 12/02/2018)    fenofibrate (TRICOR) 48 MG tablet TAKE 1 TABLET BY MOUTH ONCE DAILY    fluticasone (FLOVENT HFA) 110 MCG/ACT inhaler Inhale 1 puff into the lungs 2 (two) times daily. 12/02/2018: Taking QAM   gabapentin (NEURONTIN) 300 MG capsule TAKE 1 CAPSULE BY MOUTH AT BEDTIME FOR ONE WEEK  THEN TAKE ONE CAPSULE TWICE DAILY    lisinopril (ZESTRIL) 20 MG tablet TAKE 1 TABLET  BY MOUTH ONCE DAILY    loratadine (CLARITIN) 10 MG tablet Take 1 tablet (10 mg total) by mouth daily. (Patient not taking: Reported on 12/02/2018)    meloxicam (MOBIC) 15 MG tablet TAKE 1 TABLET BY MOUTH ONCE DAILY    montelukast (SINGULAIR) 10 MG tablet TAKE 1 TABLET BY MOUTH ONCE EVERY EVENING AT BEDTIME.    nitroGLYCERIN (NITROSTAT) 0.4 MG SL tablet Place 1 tablet (0.4 mg total) under the tongue every 5 (five) minutes as needed for chest pain. (Patient not taking: Reported on 12/02/2018)    omeprazole (PRILOSEC) 20 MG capsule TAKE 1 CAPSULE BY MOUTH ONCE DAILY (Patient not taking: Reported on 1/32/4401)    OneTouch Delica Lancets 02V MISC TEST ONCE A DAY.    ONETOUCH DELICA LANCETS FINE MISC TEST once daily    ONETOUCH ULTRA test strip TEST ONCE DAILY    PROAIR HFA 108 (90 Base) MCG/ACT inhaler INHALE 2 PUFFS BY MOUTH EVERY 6 HOURS IF NEEDED FOR COUGH OR WHEEZING. (Patient not taking: Reported on 12/02/2018)    simvastatin (ZOCOR) 40 MG tablet TAKE 1 TABLET BY MOUTH ONCE DAILY.    No facility-administered encounter medications on file as of 01/14/2019.      Goals Addressed            This Visit's Progress    RN-I need help with my Diet (pt-stated)       Current Barriers:   Knowledge Deficits related to diabetic diet and PKU diet   Financial Constraints.   Literacy barriers  Cognitive Deficits  Unable to travel to specialists outside of [facility] related to law enforced electronic monitoring.   Nurse Case Manager Clinical Goal(s):   Over the next 90 days, patient will work with [facility]  to address needs related to learning what patient can eat on Diabetic and PKU diet  Over the next 90 days, patient will work with Administrator, sports (community agency) to learn what he can eat on a diabetic and PKU diet.  Interventions:   Collaborated with [employer] via email regarding referral process. Norlene Campbell stated patient was in the system so I did not need to call  and register him. Only faxe clinic notes and request to be seen for PKU.   Norlene Campbell [email or web address] .[email or web address])   Lourdes Sledge [place]_1 .[email or web address])  Email confirmation from [facility] patient's information has been received.   Spouse states [facility] has contacted her but they left a message. When she has attempted to return their call they told her she can npt make an appointment because the patient is "too old".   Email sent to request the best number for Patient's spouse to call:" We have received the paperwork and scheduling has tried to contact them several times to schedule but they don't answer. They can call scheduling at [phone] to make an appointment."  [facility] for patient, the scheduler stated she could not see patient's referral. Transferred me to VM of genetics dept. Left a VM  Received a call back from Tanna Savoy who left a VM stating for me to call scheduler.   RNCM returned her call to make her aware of what patient's spouse stated, and the scheduler was unable to see the referral. [phone]  Again missed a call form [facility] dietician, but she left a VM stating she was able to fix the issue with the referral and patient should be able to call in and make the appointment.   Placed a call to patient's spouse and requested she call to make the appointment: [phone]   Patient Self Care Activities:   Patient needs continued support in his efforts towards adherence to general PKU guidelines within the recommendations for a diabetic diet.    Please see past updates related to this goal by clicking on the "Past Updates" button in the selected goal          The patient has been provided with contact information for the care management team and has been advised to call with any health related questions or concerns.   [name] Zaki Gertsch RN, BSN Nurse Case Editor, [facility]ing [facility] Care Management  [phone]) Business Mobile

## 2019-01-21 ENCOUNTER — Telehealth: Payer: Self-pay

## 2019-01-21 ENCOUNTER — Ambulatory Visit: Payer: Self-pay | Admitting: Nurse Practitioner

## 2019-02-02 ENCOUNTER — Ambulatory Visit: Payer: Medicare Other | Admitting: Licensed Clinical Social Worker

## 2019-02-02 DIAGNOSIS — G8929 Other chronic pain: Secondary | ICD-10-CM

## 2019-02-02 DIAGNOSIS — I10 Essential (primary) hypertension: Secondary | ICD-10-CM

## 2019-02-02 DIAGNOSIS — E119 Type 2 diabetes mellitus without complications: Secondary | ICD-10-CM

## 2019-02-02 DIAGNOSIS — M25562 Pain in left knee: Secondary | ICD-10-CM

## 2019-02-02 NOTE — Chronic Care Management (AMB) (Signed)
Chronic Care Management    Clinical Social Work Follow Up Note  02/02/2019 Name: [name]. MRN: [id number] DOB: 1961-02-02  [name]. is a 58 y.o. year old male who is a primary care patient of Cannady, Barbaraann Faster, NP. The CCM team was consulted for assistance with [name] .   Review of patient status, including review of consultants reports, other relevant assessments, and collaboration with appropriate care team members and the patient's provider was performed as part of comprehensive patient evaluation and provision of chronic care management services.    SDOH (Social Determinants of Health) screening performed today: Physical Activity. See Care Plan for related entries.   Advanced Directives Status: <no information> See Care Plan for related entries.   Outpatient Encounter Medications as of 02/02/2019  Medication Sig Note   canagliflozin (INVOKANA) 300 MG TABS tablet Take 1 tablet (300 mg total) by mouth daily before breakfast.    Cholecalciferol (VITAMIN D3) 1.25 MG (50000 UT) CAPS Take 50,000 Units by mouth once a week.    clobetasol cream (TEMOVATE) 8.54 % Apply 1 application topically 2 (two) times daily. (Patient not taking: Reported on 12/02/2018)    EPINEPHrine 0.3 mg/0.3 mL IJ SOAJ injection Inject 0.3 mLs (0.3 mg total) into the muscle once as needed for up to 1 dose. (Patient not taking: Reported on 12/02/2018)    fenofibrate (TRICOR) 48 MG tablet TAKE 1 TABLET BY MOUTH ONCE DAILY    fluticasone (FLOVENT HFA) 110 MCG/ACT inhaler Inhale 1 puff into the lungs 2 (two) times daily. 12/02/2018: Taking QAM   gabapentin (NEURONTIN) 300 MG capsule TAKE 1 CAPSULE BY MOUTH AT BEDTIME FOR ONE WEEK  THEN TAKE ONE CAPSULE TWICE DAILY    lisinopril (ZESTRIL) 20 MG tablet TAKE 1 TABLET BY MOUTH ONCE DAILY    loratadine (CLARITIN) 10 MG tablet Take 1 tablet (10 mg total) by mouth daily. (Patient not taking: Reported on 12/02/2018)    meloxicam  (MOBIC) 15 MG tablet TAKE 1 TABLET BY MOUTH ONCE DAILY    montelukast (SINGULAIR) 10 MG tablet TAKE 1 TABLET BY MOUTH ONCE EVERY EVENING AT BEDTIME.    nitroGLYCERIN (NITROSTAT) 0.4 MG SL tablet Place 1 tablet (0.4 mg total) under the tongue every 5 (five) minutes as needed for chest pain. (Patient not taking: Reported on 12/02/2018)    omeprazole (PRILOSEC) 20 MG capsule TAKE 1 CAPSULE BY MOUTH ONCE DAILY (Patient not taking: Reported on 09/12/348)    OneTouch Delica Lancets 09F MISC TEST ONCE A DAY.    ONETOUCH DELICA LANCETS FINE MISC TEST once daily    ONETOUCH ULTRA test strip TEST ONCE DAILY    PROAIR HFA 108 (90 Base) MCG/ACT inhaler INHALE 2 PUFFS BY MOUTH EVERY 6 HOURS IF NEEDED FOR COUGH OR WHEEZING. (Patient not taking: Reported on 12/02/2018)    simvastatin (ZOCOR) 40 MG tablet TAKE 1 TABLET BY MOUTH ONCE DAILY.    No facility-administered encounter medications on file as of 02/02/2019.      Goals Addressed     SW-"I want to improve my health and self-care" (pt-stated)       Current Barriers:   Financial constraints  Limited social support  Level of care concerns  ADL IADL limitations  Literacy concerns  Cognitive Deficits  Lacks knowledge of community resource: available financial support resources within the area as well as socialization opportunities within the area  Clinical Social Work Clinical Goal(s):   Over the next 120 days, client will work with  SW to address concerns related to improving self-care and implementing it whenever able  Interventions:  Patient interviewed and appropriate assessments performed  Provided mental health counseling with regard to managing everyday stressors and implementing appropriate self-care techniques in order to improve overall health. LCSW provided reflective listening and implemented appropriate interventions to help suppport patient and her emotional needs   Provided patient with information about OTC benefits  that he is uses per quarter. Patient's spouse reports that she has started to order diabetic lotion for patient and this is helping him immensely.  Patient reports trying to implement appropriate self-care by exercise and mowing his yard. Patient reports ongoing chronic pain with his knees and side.   Discussed plans with patient for ongoing care management follow up and provided patient with direct contact information for care management team  Advised patient to contact CCM embedded practice providers and PCP if any urgent concerns arise.  LCSW completed referral for a wheelchair ramp and ramp installation process has been completed.   Patient reports ongoing chronic pain and difficulty managing this without becoming irritated or ill. Patient was alert, oriented. Patient denies anxiety at this time. Discussed coping skills. SW used active and reflective listening, validated patient's feelings/concerns, and provided emotional support.  Assisted patient/caregiver with obtaining information about health plan benefits  Provided education to patient/caregiver regarding level of care options.  Patient and spouse put up christmas decorations this week in remembrance of their lost family members that passed recently. Positive reinforcement provided to family for using their creativity within their home.   Brief self-care education provided to both patient and spouse.  Patient Self Care Activities:   Attends all scheduled provider appointments  Calls provider office for new concerns or questions  Please see past updates related to this goal by clicking on the "Past Updates" button in the selected goal      Follow Up Plan: SW will follow up with patient by phone over the next quarter  Eula Fried, [place], MSW, [email or web address] Phone: [phone]

## 2019-02-11 ENCOUNTER — Other Ambulatory Visit: Payer: Self-pay

## 2019-02-11 ENCOUNTER — Telehealth: Payer: Self-pay | Admitting: Nurse Practitioner
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
## 2019-02-11 NOTE — Telephone Encounter (Signed)
Spoke with pt and advised him to go to ER or UC and he declined. Strongly suggested that pt go be seen and he said he would not go and disconnected the call.

## 2019-02-11 NOTE — Telephone Encounter (Signed)
Patient needs to go to urgent care or ER for evaluation of this today.  He will need imaging and further assessment face to face.
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
## 2019-02-16 NOTE — Telephone Encounter (Signed)
Pt. Came in office on 02/11/2019 and relayed message form Jolene. Pt cancelled upcoming apt and refused to go to ER.

## 2019-02-17 ENCOUNTER — Ambulatory Visit (INDEPENDENT_AMBULATORY_CARE_PROVIDER_SITE_OTHER): Payer: Medicare Other | Admitting: Pharmacist

## 2019-02-17 ENCOUNTER — Ambulatory Visit: Payer: Self-pay | Admitting: Nurse Practitioner

## 2019-02-17 DIAGNOSIS — I10 Essential (primary) hypertension: Secondary | ICD-10-CM

## 2019-02-17 DIAGNOSIS — E119 Type 2 diabetes mellitus without complications: Secondary | ICD-10-CM | POA: Diagnosis not present
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
## 2019-02-17 NOTE — Telephone Encounter (Signed)
Noted, he is rescheduled for 03/03/19.

## 2019-02-23 ENCOUNTER — Other Ambulatory Visit: Payer: Self-pay | Admitting: Nurse Practitioner

## 2019-02-23 ENCOUNTER — Other Ambulatory Visit: Payer: Self-pay | Admitting: Family Medicine

## 2019-02-23 ENCOUNTER — Telehealth: Payer: Self-pay | Admitting: Nurse Practitioner

## 2019-02-23 DIAGNOSIS — E1140 Type 2 diabetes mellitus with diabetic neuropathy, unspecified: Secondary | ICD-10-CM

## 2019-02-23 NOTE — Telephone Encounter (Signed)
Noted, thank you.

## 2019-02-23 NOTE — Telephone Encounter (Signed)
Pt.came in to reschedule appointment to a later date, when here Pt stated he had hit his head 2 more times and stated he still get dizzy spells and patient had knot on head. Pt was told to go to ER and or an urgent care multiple times and explained he needed to go since he has hit his head multiple times now. Pt refused to go, I rescheduled his appointment to come in tomorrow to see Macarthur Critchley understood.

## 2019-02-24 ENCOUNTER — Other Ambulatory Visit: Payer: Self-pay

## 2019-02-24 ENCOUNTER — Encounter: Payer: Self-pay | Admitting: Unknown Physician Specialty

## 2019-02-24 ENCOUNTER — Ambulatory Visit (INDEPENDENT_AMBULATORY_CARE_PROVIDER_SITE_OTHER): Payer: Medicare Other | Admitting: Unknown Physician Specialty

## 2019-02-24 VITALS — BP 130/76 | HR 60 | Temp 98.3°F

## 2019-02-24 DIAGNOSIS — I10 Essential (primary) hypertension: Secondary | ICD-10-CM

## 2019-02-24 DIAGNOSIS — E119 Type 2 diabetes mellitus without complications: Secondary | ICD-10-CM

## 2019-02-24 DIAGNOSIS — M542 Cervicalgia: Secondary | ICD-10-CM

## 2019-02-24 DIAGNOSIS — G44309 Post-traumatic headache, unspecified, not intractable: Secondary | ICD-10-CM

## 2019-02-24 DIAGNOSIS — R42 Dizziness and giddiness: Secondary | ICD-10-CM | POA: Diagnosis not present

## 2019-02-24 LAB — BAYER DCA HB A1C WAIVED: HB A1C (BAYER DCA - WAIVED): 7.7 % — ABNORMAL HIGH (ref <7.0)

## 2019-02-24 NOTE — Assessment & Plan Note (Signed)
Pt with Hgb A1C of 7.7% Higher than previous.  Continue to work with diet and exercise.
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
## 2019-02-24 NOTE — Assessment & Plan Note (Signed)
Stable, continue present medications.

## 2019-02-25 LAB — COMPREHENSIVE METABOLIC PANEL
ALT: 36 IU/L (ref 0–44)
AST: 25 IU/L (ref 0–40)
Albumin/Globulin Ratio: 1.8 (ref 1.2–2.2)
Albumin: 4.4 g/dL (ref 3.8–4.9)
Alkaline Phosphatase: 139 IU/L — ABNORMAL HIGH (ref 39–117)
BUN/Creatinine Ratio: 12 (ref 9–20)
BUN: 16 mg/dL (ref 6–24)
Bilirubin Total: 0.5 mg/dL (ref 0.0–1.2)
CO2: 20 mmol/L (ref 20–29)
Calcium: 9.6 mg/dL (ref 8.7–10.2)
Chloride: 102 mmol/L (ref 96–106)
Creatinine, Ser: 1.38 mg/dL — ABNORMAL HIGH (ref 0.76–1.27)
GFR calc Af Amer: 66 mL/min/{1.73_m2} (ref >59)
GFR calc non Af Amer: 57 mL/min/{1.73_m2} — ABNORMAL LOW (ref >59)
Globulin, Total: 2.5 g/dL (ref 1.5–4.5)
Glucose: 222 mg/dL — ABNORMAL HIGH (ref 65–99)
Potassium: 4.3 mmol/L (ref 3.5–5.2)
Sodium: 137 mmol/L (ref 134–144)
Total Protein: 6.9 g/dL (ref 6.0–8.5)

## 2019-02-26 ENCOUNTER — Telehealth: Payer: Self-pay | Admitting: Nurse Practitioner

## 2019-02-26 ENCOUNTER — Ambulatory Visit: Payer: Medicare Other | Admitting: Unknown Physician Specialty

## 2019-02-26 NOTE — Telephone Encounter (Signed)
Will reach out to provider who performed exam to see if this is okay.

## 2019-02-26 NOTE — Telephone Encounter (Signed)
Copied from [place] [id number]. Topic: General - Other >> Feb 26, 2019 10:52 AM Parke Poisson wrote: Reason for CRM: Pt is scheduled for head CT 03/23/19.Just wanted to make sure it was ok for him to wait this long.Diagnosis is G44.309 (ICD-10-CM) - Post-traumatic headache, not intractable, unspecified chronicity pattern

## 2019-02-27 ENCOUNTER — Telehealth: Payer: Self-pay | Admitting: Nurse Practitioner

## 2019-02-27 NOTE — Telephone Encounter (Signed)
Pt's wife [name] is calling back saying that she is returning Sarah's call.

## 2019-03-02 ENCOUNTER — Inpatient Hospital Stay: Payer: Medicare Other

## 2019-03-02 ENCOUNTER — Inpatient Hospital Stay
Admission: EM | Admit: 2019-03-02 | Discharge: 2019-03-06 | DRG: 065 | Disposition: A | Discharge status: Rehabilitation Facility | Payer: Medicare Other | Attending: Internal Medicine | Admitting: Internal Medicine

## 2019-03-02 ENCOUNTER — Emergency Department: Payer: Medicare Other

## 2019-03-02 ENCOUNTER — Inpatient Hospital Stay (HOSPITAL_COMMUNITY)
Admit: 2019-03-02 | Discharge: 2019-03-02 | Disposition: A | Discharge status: Home / Self Care | Payer: Medicare Other | Attending: Internal Medicine | Admitting: Internal Medicine

## 2019-03-02 DIAGNOSIS — Z20828 Contact with and (suspected) exposure to other viral communicable diseases: Secondary | ICD-10-CM | POA: Diagnosis present

## 2019-03-02 DIAGNOSIS — E701 Other hyperphenylalaninemias: Secondary | ICD-10-CM

## 2019-03-02 DIAGNOSIS — E1151 Type 2 diabetes mellitus with diabetic peripheral angiopathy without gangrene: Secondary | ICD-10-CM | POA: Diagnosis present

## 2019-03-02 DIAGNOSIS — E1136 Type 2 diabetes mellitus with diabetic cataract: Secondary | ICD-10-CM | POA: Diagnosis present

## 2019-03-02 DIAGNOSIS — Z7951 Long term (current) use of inhaled steroids: Secondary | ICD-10-CM

## 2019-03-02 DIAGNOSIS — Z9109 Other allergy status, other than to drugs and biological substances: Secondary | ICD-10-CM

## 2019-03-02 DIAGNOSIS — R066 Hiccough: Secondary | ICD-10-CM | POA: Diagnosis present

## 2019-03-02 DIAGNOSIS — Z7984 Long term (current) use of oral hypoglycemic drugs: Secondary | ICD-10-CM

## 2019-03-02 DIAGNOSIS — J449 Chronic obstructive pulmonary disease, unspecified: Secondary | ICD-10-CM | POA: Diagnosis present

## 2019-03-02 DIAGNOSIS — Z8249 Family history of ischemic heart disease and other diseases of the circulatory system: Secondary | ICD-10-CM

## 2019-03-02 DIAGNOSIS — H532 Diplopia: Secondary | ICD-10-CM

## 2019-03-02 DIAGNOSIS — I639 Cerebral infarction, unspecified: Secondary | ICD-10-CM

## 2019-03-02 DIAGNOSIS — I10 Essential (primary) hypertension: Secondary | ICD-10-CM | POA: Diagnosis present

## 2019-03-02 DIAGNOSIS — R29701 NIHSS score 1: Secondary | ICD-10-CM | POA: Diagnosis present

## 2019-03-02 DIAGNOSIS — F0781 Postconcussional syndrome: Secondary | ICD-10-CM

## 2019-03-02 DIAGNOSIS — R519 Headache, unspecified: Secondary | ICD-10-CM | POA: Diagnosis present

## 2019-03-02 DIAGNOSIS — E1169 Type 2 diabetes mellitus with other specified complication: Secondary | ICD-10-CM

## 2019-03-02 DIAGNOSIS — I081 Rheumatic disorders of both mitral and tricuspid valves: Secondary | ICD-10-CM | POA: Diagnosis present

## 2019-03-02 DIAGNOSIS — K5901 Slow transit constipation: Secondary | ICD-10-CM

## 2019-03-02 DIAGNOSIS — Z833 Family history of diabetes mellitus: Secondary | ICD-10-CM

## 2019-03-02 DIAGNOSIS — E785 Hyperlipidemia, unspecified: Secondary | ICD-10-CM | POA: Diagnosis present

## 2019-03-02 DIAGNOSIS — R109 Unspecified abdominal pain: Secondary | ICD-10-CM

## 2019-03-02 DIAGNOSIS — Z9181 History of falling: Secondary | ICD-10-CM | POA: Diagnosis not present

## 2019-03-02 DIAGNOSIS — K580 Irritable bowel syndrome with diarrhea: Secondary | ICD-10-CM | POA: Diagnosis present

## 2019-03-02 DIAGNOSIS — K219 Gastro-esophageal reflux disease without esophagitis: Secondary | ICD-10-CM | POA: Diagnosis present

## 2019-03-02 DIAGNOSIS — Z91030 Bee allergy status: Secondary | ICD-10-CM | POA: Diagnosis not present

## 2019-03-02 DIAGNOSIS — R112 Nausea with vomiting, unspecified: Secondary | ICD-10-CM

## 2019-03-02 DIAGNOSIS — K5900 Constipation, unspecified: Secondary | ICD-10-CM | POA: Diagnosis not present

## 2019-03-02 DIAGNOSIS — I6389 Other cerebral infarction: Secondary | ICD-10-CM

## 2019-03-02 DIAGNOSIS — Z8619 Personal history of other infectious and parasitic diseases: Secondary | ICD-10-CM | POA: Diagnosis not present

## 2019-03-02 DIAGNOSIS — R42 Dizziness and giddiness: Secondary | ICD-10-CM | POA: Diagnosis not present

## 2019-03-02 DIAGNOSIS — G8191 Hemiplegia, unspecified affecting right dominant side: Secondary | ICD-10-CM | POA: Diagnosis present

## 2019-03-02 DIAGNOSIS — Z882 Allergy status to sulfonamides status: Secondary | ICD-10-CM

## 2019-03-02 DIAGNOSIS — Z791 Long term (current) use of non-steroidal anti-inflammatories (NSAID): Secondary | ICD-10-CM

## 2019-03-02 DIAGNOSIS — Z888 Allergy status to other drugs, medicaments and biological substances status: Secondary | ICD-10-CM

## 2019-03-02 DIAGNOSIS — E1165 Type 2 diabetes mellitus with hyperglycemia: Secondary | ICD-10-CM

## 2019-03-02 DIAGNOSIS — Z79899 Other long term (current) drug therapy: Secondary | ICD-10-CM

## 2019-03-02 HISTORY — DX: Cerebral infarction, unspecified: I63.9

## 2019-03-02 LAB — CBC WITH DIFFERENTIAL/PLATELET
Abs Immature Granulocytes: 0.05 10*3/uL (ref 0.00–0.07)
Basophils Absolute: 0 10*3/uL (ref 0.0–0.1)
Basophils Relative: 0 %
Eosinophils Absolute: 0.1 10*3/uL (ref 0.0–0.5)
Eosinophils Relative: 1 %
HCT: 40.5 % (ref 39.0–52.0)
Hemoglobin: 14.4 g/dL (ref 13.0–17.0)
Immature Granulocytes: 1 %
Lymphocytes Relative: 15 %
Lymphs Abs: 1.3 10*3/uL (ref 0.7–4.0)
MCH: 30.8 pg (ref 26.0–34.0)
MCHC: 35.6 g/dL (ref 30.0–36.0)
MCV: 86.7 fL (ref 80.0–100.0)
Monocytes Absolute: 0.5 10*3/uL (ref 0.1–1.0)
Monocytes Relative: 6 %
Neutro Abs: 7.1 10*3/uL (ref 1.7–7.7)
Neutrophils Relative %: 77 %
Platelets: 165 10*3/uL (ref 150–400)
RBC: 4.67 MIL/uL (ref 4.22–5.81)
RDW: 12.7 % (ref 11.5–15.5)
WBC: 9.2 10*3/uL (ref 4.0–10.5)
nRBC: 0 % (ref 0.0–0.2)

## 2019-03-02 LAB — HEMOGLOBIN A1C
Hgb A1c MFr Bld: 8.2 % — ABNORMAL HIGH (ref 4.8–5.6)
Mean Plasma Glucose: 188.64 mg/dL

## 2019-03-02 LAB — APTT: aPTT: 25 seconds (ref 24–36)

## 2019-03-02 LAB — COMPREHENSIVE METABOLIC PANEL
ALT: 33 U/L (ref 0–44)
AST: 31 U/L (ref 15–41)
Albumin: 4.1 g/dL (ref 3.5–5.0)
Alkaline Phosphatase: 120 U/L (ref 38–126)
Anion gap: 9 (ref 5–15)
BUN: 15 mg/dL (ref 6–20)
CO2: 24 mmol/L (ref 22–32)
Calcium: 9.1 mg/dL (ref 8.9–10.3)
Chloride: 103 mmol/L (ref 98–111)
Creatinine, Ser: 1.2 mg/dL (ref 0.61–1.24)
GFR calc Af Amer: >60 mL/min (ref >60)
GFR calc non Af Amer: >60 mL/min (ref >60)
Glucose, Bld: 274 mg/dL — ABNORMAL HIGH (ref 70–99)
Potassium: 4 mmol/L (ref 3.5–5.1)
Sodium: 136 mmol/L (ref 135–145)
Total Bilirubin: 0.5 mg/dL (ref 0.3–1.2)
Total Protein: 6.7 g/dL (ref 6.5–8.1)

## 2019-03-02 LAB — SARS CORONAVIRUS 2 (TAT 6-24 HRS): SARS Coronavirus 2: NEGATIVE

## 2019-03-02 LAB — GLUCOSE, CAPILLARY
Glucose-Capillary: 204 mg/dL — ABNORMAL HIGH (ref 70–99)
Glucose-Capillary: 156 mg/dL — ABNORMAL HIGH (ref 70–99)

## 2019-03-02 LAB — PROTIME-INR
INR: 1 (ref 0.8–1.2)
Prothrombin Time: 13.2 seconds (ref 11.4–15.2)

## 2019-03-02 MED ORDER — INSULIN ASPART 100 UNIT/ML ~~LOC~~ SOLN
0.0000 [IU] | Freq: Three times a day (TID) | SUBCUTANEOUS | Status: DC
  Administered 2019-03-02: 13:00:00 3 [IU] via SUBCUTANEOUS
  Administered 2019-03-02: 2 [IU] via SUBCUTANEOUS
  Administered 2019-03-03: 18:00:00 3 [IU] via SUBCUTANEOUS
  Administered 2019-03-03 – 2019-03-04 (×3): 2 [IU] via SUBCUTANEOUS
  Administered 2019-03-04: 18:00:00 3 [IU] via SUBCUTANEOUS
  Administered 2019-03-04: 09:00:00 2 [IU] via SUBCUTANEOUS
  Administered 2019-03-05: 11:00:00 3 [IU] via SUBCUTANEOUS
  Administered 2019-03-05: 13:00:00 2 [IU] via SUBCUTANEOUS
  Administered 2019-03-05: 17:00:00 5 [IU] via SUBCUTANEOUS
  Administered 2019-03-06: 2 [IU] via SUBCUTANEOUS
  Administered 2019-03-06: 13:00:00 3 [IU] via SUBCUTANEOUS
  Filled 2019-03-02 (×12): qty 1

## 2019-03-02 MED ORDER — ASPIRIN EC 81 MG PO TBEC
81.0000 mg | DELAYED_RELEASE_TABLET | Freq: Every day | ORAL | Status: DC
  Administered 2019-03-02 – 2019-03-06 (×5): 81 mg via ORAL
  Filled 2019-03-02 (×5): qty 1

## 2019-03-02 MED ORDER — KETOROLAC TROMETHAMINE 30 MG/ML IJ SOLN
15.0000 mg | Freq: Once | INTRAMUSCULAR | Status: AC
  Administered 2019-03-02: 15 mg via INTRAVENOUS
  Filled 2019-03-02: qty 1

## 2019-03-02 MED ORDER — ACETAMINOPHEN 650 MG RE SUPP: 650.0000 mg | RECTAL | Status: DC | PRN

## 2019-03-02 MED ORDER — SODIUM CHLORIDE 0.9 % IV BOLUS
1000.0000 mL | Freq: Once | INTRAVENOUS | Status: AC
  Administered 2019-03-02: 1000 mL via INTRAVENOUS

## 2019-03-02 MED ORDER — BUTALBITAL-APAP-CAFFEINE 50-325-40 MG PO TABS
1.0000 | ORAL_TABLET | Freq: Once | ORAL | Status: AC
  Administered 2019-03-02: 04:00:00 1 via ORAL
  Filled 2019-03-02: qty 1

## 2019-03-02 MED ORDER — GABAPENTIN 300 MG PO CAPS
300.0000 mg | ORAL_CAPSULE | Freq: Two times a day (BID) | ORAL | Status: DC
  Administered 2019-03-03 – 2019-03-06 (×8): 300 mg via ORAL
  Filled 2019-03-02 (×8): qty 1

## 2019-03-02 MED ORDER — ACETAMINOPHEN 325 MG PO TABS
650.0000 mg | ORAL_TABLET | ORAL | Status: DC | PRN
  Administered 2019-03-03 – 2019-03-06 (×5): 650 mg via ORAL
  Filled 2019-03-02 (×6): qty 2

## 2019-03-02 MED ORDER — FLUTICASONE PROPIONATE HFA 110 MCG/ACT IN AERO: 1.0000 | INHALATION_SPRAY | Freq: Every day | RESPIRATORY_TRACT | Status: DC

## 2019-03-02 MED ORDER — CLOPIDOGREL BISULFATE 75 MG PO TABS
75.0000 mg | ORAL_TABLET | Freq: Every day | ORAL | Status: DC
  Administered 2019-03-02 – 2019-03-06 (×5): 75 mg via ORAL
  Filled 2019-03-02 (×5): qty 1

## 2019-03-02 MED ORDER — SODIUM CHLORIDE 0.9% FLUSH: 3.0000 mL | Freq: Once | INTRAVENOUS | Status: DC

## 2019-03-02 MED ORDER — METOCLOPRAMIDE HCL 5 MG/ML IJ SOLN
10.0000 mg | Freq: Once | INTRAMUSCULAR | Status: AC
  Administered 2019-03-02: 10 mg via INTRAVENOUS
  Filled 2019-03-02: qty 2

## 2019-03-02 MED ORDER — BUDESONIDE 0.25 MG/2ML IN SUSP
0.2500 mg | Freq: Two times a day (BID) | RESPIRATORY_TRACT | Status: DC
  Administered 2019-03-03 – 2019-03-06 (×7): 0.25 mg via RESPIRATORY_TRACT
  Filled 2019-03-02 (×7): qty 2

## 2019-03-02 MED ORDER — PANTOPRAZOLE SODIUM 40 MG PO TBEC
40.0000 mg | DELAYED_RELEASE_TABLET | Freq: Every day | ORAL | Status: DC
  Administered 2019-03-02 – 2019-03-06 (×5): 40 mg via ORAL
  Filled 2019-03-02 (×5): qty 1

## 2019-03-02 MED ORDER — ACETAMINOPHEN 160 MG/5ML PO SOLN
650.0000 mg | ORAL | Status: DC | PRN
  Filled 2019-03-02: qty 20.3

## 2019-03-02 MED ORDER — ONDANSETRON HCL 4 MG/2ML IJ SOLN
4.0000 mg | Freq: Once | INTRAMUSCULAR | Status: AC
  Administered 2019-03-02: 05:00:00 4 mg via INTRAVENOUS
  Filled 2019-03-02: qty 2

## 2019-03-02 MED ORDER — SIMVASTATIN 20 MG PO TABS
40.0000 mg | ORAL_TABLET | Freq: Every day | ORAL | Status: DC
  Administered 2019-03-03 – 2019-03-04 (×3): 40 mg via ORAL
  Filled 2019-03-02 (×3): qty 2

## 2019-03-02 MED ORDER — DIPHENHYDRAMINE HCL 50 MG/ML IJ SOLN
50.0000 mg | Freq: Once | INTRAMUSCULAR | Status: AC
  Administered 2019-03-02: 05:00:00 50 mg via INTRAVENOUS
  Filled 2019-03-02: qty 1

## 2019-03-02 MED ORDER — STROKE: EARLY STAGES OF RECOVERY BOOK: Freq: Once | Status: AC

## 2019-03-02 MED ORDER — IOHEXOL 350 MG/ML SOLN
75.0000 mL | Freq: Once | INTRAVENOUS | Status: AC | PRN
  Administered 2019-03-02: 13:00:00 75 mL via INTRAVENOUS

## 2019-03-02 MED ORDER — SODIUM CHLORIDE 0.9 % IV BOLUS
1000.0000 mL | Freq: Once | INTRAVENOUS | Status: AC
  Administered 2019-03-02: 04:00:00 1000 mL via INTRAVENOUS

## 2019-03-02 MED ORDER — FENOFIBRATE 54 MG PO TABS
54.0000 mg | ORAL_TABLET | Freq: Every day | ORAL | Status: DC
  Administered 2019-03-03 – 2019-03-06 (×4): 54 mg via ORAL
  Filled 2019-03-02 (×4): qty 1

## 2019-03-02 MED ORDER — ENOXAPARIN SODIUM 40 MG/0.4ML ~~LOC~~ SOLN
40.0000 mg | SUBCUTANEOUS | Status: DC
  Administered 2019-03-03 – 2019-03-05 (×4): 40 mg via SUBCUTANEOUS
  Filled 2019-03-02 (×4): qty 0.4

## 2019-03-02 NOTE — ED Triage Notes (Signed)
Patient presents to Emergency Department via [facility] from home with complaints of HA, slurring and right side weakness.    Only neuro deficits is diminished sensation to right shin compared to left

## 2019-03-02 NOTE — ED Provider Notes (Signed)
[facility] Emergency Department Provider Note  ____________________________________________  Time seen: Approximately 2:54 AM  I have reviewed the triage vital signs and the nursing notes.   HISTORY  Chief Complaint Headache, Dizziness, and Weakness   HPI [name]. is a 58 y.o. male the history of diabetes, asthma, GERD, PKU, hypertension, hyperlipidemia, IBS, mentally challenged who presents for evaluation of a headache.  Patient reports that he has been having he has these headaches daily for 2-3 weeks since sustaining a fall with head trauma. He fell out of a trailer and hit his head on the floor.   He reports that his current HA started at midnight.  He was coming out of the shower when he developed sudden severe headache that he describes as throbbing, sharp, generalized, 10 out of 10, associated with dizziness.  Patient reports that he felt like his R leg was weaker than the L and he was unable to walk straight without leaning to the right. Patient reports seeing his PCP recently for these headaches who ordered a CT scan however that has not been done yet.  Patient is not on blood thinners, no fever, no chills, no exposure to Covid, no body aches, no loss of taste or smell, no chest pain or shortness of breath, no cough, no vomiting. Has had some diarrhea at home. Patient reports not take anything at home for this pain.  Past Medical History:  Diagnosis Date  . Arrhythmia   . Asthma   . Cyst of kidney, acquired   . Diabetes mellitus without complication ([place]) 7564   type 2  . GERD (gastroesophageal reflux disease)   . History of chicken pox   . History of measles as a child   . History of PKU   . Hyperlipidemia   . Hypertension   . IBS (irritable bowel syndrome)   . Irregular heart beat   . Mentally challenged   . Pancreatitis     Patient Active Problem List   Diagnosis Date Noted  . Obesity 09/03/2018  . Skin tags, multiple  acquired 08/23/2016  . Subacromial bursitis 08/23/2016  . Frequent falls 05/15/2016  . Vitamin D deficiency 08/22/2015  . Knee pain 08/22/2015  . Carpal tunnel syndrome on left 05/12/2015  . External hemorrhoids 12/20/2014  . Ectatic abdominal aorta ([place]) 10/26/2014  . Allergic rhinitis 10/21/2014  . Diverticulosis 10/21/2014  . Diabetes mellitus type 2, controlled ([place]) 10/21/2014  . Hypertension 10/21/2014  . Hyperlipidemia associated with type 2 diabetes mellitus ([place]) 10/21/2014  . Pulmonary emphysema ([place]) 10/21/2014  . GERD (gastroesophageal reflux disease) 10/21/2014  . NAFLD (nonalcoholic fatty liver disease) 10/21/2014  . 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 Arthritis 10/21/2014  . Chest pain with high risk for cardiac etiology 10/21/2014  . Abdominal pain 10/07/2014  . Elevation of level of transaminase or lactic acid dehydrogenase (LDH) 10/07/2014  . Cardiac arrhythmia 10/07/2014  . Irritable bowel syndrome 10/07/2014  . Pancreatitis 10/07/2014  . Phenylketonuria (PKU) ([place]) 10/07/2014  . Renal cyst, left 10/07/2014  . Headache 10/07/2014  . Gall bladder polyp 10/01/2013    Past Surgical History:  Procedure Laterality Date  . Cardiac Catherization     [facility]  . CARDIAC CATHETERIZATION     [facility]  . COLONOSCOPY    . ESOPHAGOGASTRODUODENOSCOPY (EGD) WITH PROPOFOL N/A 12/27/2016   Procedure: ESOPHAGOGASTRODUODENOSCOPY (EGD) WITH PROPOFOL;  Surgeon: Lin Landsman, MD;  Location: [place];  Service: Gastroenterology;  Laterality: N/A;  . HEMORRHOID SURGERY    .  Ligament Removal Left    of left thumb: dr. Cleda Mccreedy  . ligament removal  of left thumb     Dr. Cleda Mccreedy  . NM GATED MYOCARDIAL STUDY (ARMX HX)  06/23/2014   [place]. Normal    Prior to Admission medications   Medication Sig Start Date End Date Taking? Authorizing Provider  canagliflozin (INVOKANA) 300 MG TABS tablet Take 1 tablet (300 mg total) by mouth daily before breakfast. 08/04/18   Birdie Sons,  MD  Cholecalciferol (VITAMIN D3) 1.25 MG (50000 UT) CAPS Take 50,000 Units by mouth once a week. 08/27/18   Cannady, Henrine Screws T, NP  clobetasol cream (TEMOVATE) 0.17 % Apply 1 application topically 2 (two) times daily. Patient not taking: Reported on 12/02/2018 10/01/18   Marnee Guarneri T, NP  EPINEPHrine 0.3 mg/0.3 mL IJ SOAJ injection Inject 0.3 mLs (0.3 mg total) into the muscle once as needed for up to 1 dose. Patient not taking: Reported on 12/02/2018 11/29/17   Birdie Sons, MD  fenofibrate (TRICOR) 48 MG tablet TAKE 1 TABLET BY MOUTH ONCE DAILY 02/23/19   Cannady, Jolene T, NP  fluticasone (FLOVENT HFA) 110 MCG/ACT inhaler Inhale 1 puff into the lungs 2 (two) times daily. 03/31/18   Birdie Sons, MD  gabapentin (NEURONTIN) 300 MG capsule TAKE 1 CAPSULE BY MOUTH AT BEDTIME FOR ONE WEEK  THEN TAKE ONE CAPSULE TWICE DAILY 06/24/18   Birdie Sons, MD  lisinopril (ZESTRIL) 20 MG tablet TAKE 1 TABLET BY MOUTH ONCE DAILY 08/20/18   Birdie Sons, MD  loratadine (CLARITIN) 10 MG tablet Take 1 tablet (10 mg total) by mouth daily. Patient not taking: Reported on 12/02/2018 10/01/18   Marnee Guarneri T, NP  meloxicam (MOBIC) 15 MG tablet TAKE 1 TABLET BY MOUTH ONCE DAILY 02/23/19   Cannady, Jolene T, NP  montelukast (SINGULAIR) 10 MG tablet TAKE 1 TABLET BY MOUTH ONCE EVERY EVENING AT BEDTIME. 01/13/19   Birdie Sons, MD  nitroGLYCERIN (NITROSTAT) 0.4 MG SL tablet Place 1 tablet (0.4 mg total) under the tongue every 5 (five) minutes as needed for chest pain. Patient not taking: Reported on 12/02/2018 08/27/17   Birdie Sons, MD  omeprazole (PRILOSEC) 20 MG capsule TAKE 1 CAPSULE BY MOUTH ONCE DAILY 09/09/17   Birdie Sons, MD  OneTouch Delica Lancets 51W MISC TEST ONCE A DAY. 06/06/18   Birdie Sons, MD  [facility] DELICA LANCETS FINE MISC TEST once daily 04/23/16   Birdie Sons, MD  [facility] ULTRA test strip TEST ONCE DAILY 11/15/18   Birdie Sons, MD  PROAIR HFA [phone] [id number] Base) MCG/ACT  inhaler INHALE 2 PUFFS BY MOUTH EVERY 6 HOURS IF NEEDED FOR COUGH OR WHEEZING. Patient not taking: Reported on 12/02/2018 11/13/17   Birdie Sons, MD  simvastatin (ZOCOR) 40 MG tablet TAKE 1 TABLET BY MOUTH ONCE DAILY. 01/13/19   Birdie Sons, MD    Allergies Reglan [metoclopramide], Bee venom, Other, and Sulfa antibiotics  Family History  Problem Relation Age of Onset  . Cancer Mother        throat  . Diabetes Father   . Heart disease Father   . Cancer Maternal Grandmother   . Cancer Maternal Grandfather        pancreatic  . Cancer Paternal Grandfather     Social History Social History   Tobacco Use  . Smoking status: Never Smoker  . Smokeless tobacco: Never Used  Substance Use Topics  . Alcohol use: No  Alcohol/week: 0.0 standard drinks  . Drug use: No    Review of Systems  Constitutional: Negative for fever. Eyes: Negative for visual changes. ENT: Negative for sore throat. Neck: No neck pain  Cardiovascular: Negative for chest pain. Respiratory: Negative for shortness of breath. Gastrointestinal: Negative for abdominal pain, vomiting or diarrhea. Genitourinary: Negative for dysuria. Musculoskeletal: Negative for back pain. Skin: Negative for rash. Neurological: Negative for weakness or numbness. + HA and dizziness Psych: No SI or HI  ____________________________________________   PHYSICAL EXAM:  VITAL SIGNS: ED Triage Vitals  Enc Vitals Group     BP 03/02/19 0220 (!) 154/83     Pulse Rate 03/02/19 0220 (!) 53     Resp 03/02/19 0220 20     Temp 03/02/19 0220 97.6 F (36.4 C)     Temp Source 03/02/19 0220 Oral     SpO2 03/02/19 0220 98 %     Weight 03/02/19 0222 175 lb (79.4 kg)     Height 03/02/19 0222 5' 4" (1.626 m)     Head Circumference --      Peak Flow --      Pain Score 03/02/19 0220 10     Pain Loc --      Pain Edu? --      Excl. in [place]? --     Constitutional: Alert and oriented. Well appearing and in no apparent  distress. HEENT:      Head: Normocephalic and atraumatic.         Eyes: Conjunctivae are normal. Sclera is non-icteric.       Mouth/Throat: Mucous membranes are moist.       Neck: Supple with no signs of meningismus. No cspine tenderness Cardiovascular: Regular rate and rhythm. No murmurs, gallops, or rubs. 2+ symmetrical distal pulses are present in all extremities. No JVD. Respiratory: Normal respiratory effort. Lungs are clear to auscultation bilaterally. No wheezes, crackles, or rhonchi.  Gastrointestinal: Soft, non tender, and non distended with positive bowel sounds. No rebound or guarding. Musculoskeletal: Nontender with normal range of motion in all extremities. No edema, cyanosis, or erythema of extremities. Neurologic: Normal speech and language. A & O x3, PERRL, EOMI, no nystagmus, CN II-XII intact, motor testing reveals good tone and bulk throughout. There is no evidence of pronator drift or dysmetria. Muscle strength is 5/5 throughout. Sensory examination is intact. Gait deferred due to significant dizziness with standing.  Skin: Skin is warm, dry and intact. No rash noted. Psychiatric: Mood and affect are normal. Speech and behavior are normal.  ____________________________________________   LABS (all labs ordered are listed, but only abnormal results are displayed)  Labs Reviewed  COMPREHENSIVE METABOLIC PANEL - Abnormal; Notable for the following components:      Result Value   Glucose, Bld 274 (*)    All other components within normal limits  PROTIME-INR  APTT  CBC WITH DIFFERENTIAL/PLATELET  CBG MONITORING, ED   ____________________________________________  EKG  ED ECG REPORT I, Rudene Re, the attending physician, personally viewed and interpreted this ECG.  Sinus bradycardia, rate of 53, normal QTC, left axis deviation, LAFB, no ST elevations or depressions.  Unchanged from prior. ____________________________________________  RADIOLOGY  I have  personally reviewed the images performed during this visit and I agree with the Radiologist's read.   Interpretation by Radiologist:  CT HEAD WO CONTRAST  Result Date: 03/02/2019 CLINICAL DATA:  Ataxia, stroke suspected Possible stroke Headache, slowing, right-sided weakness. EXAM: CT HEAD WITHOUT CONTRAST TECHNIQUE: Contiguous axial images were obtained from  the base of the skull through the vertex without intravenous contrast. COMPARISON:  None. FINDINGS: Brain: No intracranial hemorrhage, mass effect, or midline shift. No hydrocephalus. The basilar cisterns are patent. No evidence of territorial infarct or acute ischemia. No extra-axial or intracranial fluid collection. Vascular: No hyperdense vessel or unexpected calcification. Skull: Normal. Negative for fracture or focal lesion. Sinuses/Orbits: Paranasal sinuses and mastoid air cells are clear. The visualized orbits are unremarkable. Other: None. IMPRESSION: Unremarkable noncontrast head CT. Electronically Signed   By: Keith Rake M.D.   On: 03/02/2019 02:45     ____________________________________________   PROCEDURES  Procedure(s) performed: None Procedures Critical Care performed:  None ____________________________________________   INITIAL IMPRESSION / ASSESSMENT AND PLAN / ED COURSE  58 y.o. male the history of diabetes, asthma, GERD, PKU, hypertension, hyperlipidemia, IBS, mentally challenged who presents for evaluation of a daily headaches since a fall with head trauma 2-3 weeks ago, now with dizziness and feeling like his gait is leaning to the right.  He is neurologically intact at this time but unable to ambulate due to dizziness.  Complaining of a 10 out of 10 headache and dizziness.  He has no neck stiffness or tenderness, head is atraumtic, no meningeal signs, normal vitals with no fever.  Has had some diarrhea but no fever or respiratory symptoms. Will get a head CT to rule out head bleed. No signs of stroke or  meningitis. Will check labs to rule out dehydration. Low suspicion for COVID. Possibly concussion vs migraine.    _________________________ 3:06 AM on 03/02/2019 -----------------------------------------  CT negative for bleed. Will treat with migraine cocktail and reassess.    Clinical Course as of Mar 02 643  Mon Mar 02, 2019  9169 Patient reassessed after IV fluids and migraine cocktail.  When I walked in the room patient was covered in vomit. HA resolved. Very nauseated and now with horizontal unidirectional nystagmus after receiving Reglan. Will give IV benadryl and zofran.  Will get MRI   [CV]  0625 Patient has an ankle bracelet that can only be removed by patient's probation officer. Wife is trying to contact the probation officer but unable at this time. She will continue to try.   [CV]    Clinical Course User Index [CV] Alfred Levins [place], MD   _________________________ 7:03 AM on 03/02/2019 ----------------------------------------- Care transferred to Dr. Corky Downs pending MRI.    As part of my medical decision making, I reviewed the following data within the [place] History obtained from family, Nursing notes reviewed and incorporated, Labs reviewed , EKG interpreted , Old chart reviewed, Radiograph reviewed , Notes from prior ED visits and [place] Controlled Substance Database   Please note:  Patient was evaluated in Emergency Department today for the symptoms described in the history of present illness. Patient was evaluated in the context of the global COVID-19 pandemic, which necessitated consideration that the patient might be at risk for infection with the SARS-CoV-2 virus that causes COVID-19. Institutional protocols and algorithms that pertain to the evaluation of patients at risk for COVID-19 are in a state of rapid change based on information released by regulatory bodies including the CDC and federal and state organizations. These policies and algorithms  were followed during the patient's care in the ED.  Some ED evaluations and interventions may be delayed as a result of limited staffing during the pandemic.   ____________________________________________   FINAL CLINICAL IMPRESSION(S) / ED DIAGNOSES   Final diagnoses:  Post concussive syndrome  NEW MEDICATIONS STARTED DURING THIS VISIT:  ED Discharge Orders    None       Note:  This document was prepared using Dragon voice recognition software and may include unintentional dictation errors.    Rudene Re, MD 03/02/19 [phone]

## 2019-03-02 NOTE — Consult Note (Signed)
Referring Physician: Cruzita Lederer    Chief Complaint: Headache and right sided weakness  HPI: [name]. is an 58 y.o. male with a history of HTN, HLD and DM who reports being at baseline until coming out of the shower at 0100 this morning.  He was coming out of the shower when he developed sudden severe headache that he described as throbbing, sharp, generalized, 10 out of 10, associated with dizziness.  Patient reports that he felt like his R leg was weaker than the L and he was unable to walk straight without leaning to the right. Has been having headaches over the past 2-3 weeks without associated focal neurological symptoms.  Initial NIHSS of 1.    Date last known well: Date: 03/02/2019 Time last known well: Time: 01:00 tPA Given: No: Minimal symptoms  Past Medical History:  Diagnosis Date  . Arrhythmia   . Asthma   . Cyst of kidney, acquired   . Diabetes mellitus without complication ([place]) 9381   type 2  . GERD (gastroesophageal reflux disease)   . History of chicken pox   . History of measles as a child   . History of PKU   . Hyperlipidemia   . Hypertension   . IBS (irritable bowel syndrome)   . Irregular heart beat   . Mentally challenged   . Pancreatitis     Past Surgical History:  Procedure Laterality Date  . Cardiac Catherization     [facility]  . CARDIAC CATHETERIZATION     [facility]  . COLONOSCOPY    . ESOPHAGOGASTRODUODENOSCOPY (EGD) WITH PROPOFOL N/A 12/27/2016   Procedure: ESOPHAGOGASTRODUODENOSCOPY (EGD) WITH PROPOFOL;  Surgeon: Lin Landsman, MD;  Location: [place];  Service: Gastroenterology;  Laterality: N/A;  . HEMORRHOID SURGERY    . Ligament Removal Left    of left thumb: dr. Cleda Mccreedy  . ligament removal  of left thumb     Dr. Cleda Mccreedy  . NM GATED MYOCARDIAL STUDY (ARMX HX)  06/23/2014   Paraschos. Normal    Family History  Problem Relation Age of Onset  . Cancer Mother        throat  . Diabetes Father   . Heart disease Father   . Cancer  Maternal Grandmother   . Cancer Maternal Grandfather        pancreatic  . Cancer Paternal Grandfather    Social History:  reports that he has never smoked. He has never used smokeless tobacco. He reports that he does not drink alcohol or use drugs.  Allergies:  Allergies  Allergen Reactions  . Reglan [Metoclopramide]     Extrapyramidal side effects  . Bee Venom Hives    All kinds of bees  . Other     Certain powders  . Sulfa Antibiotics     Medications:  I have reviewed the patient's current medications. Prior to Admission:  Prior to Admission medications   Medication Sig Start Date End Date Taking? Authorizing Provider  canagliflozin (INVOKANA) 300 MG TABS tablet Take 1 tablet (300 mg total) by mouth daily before breakfast. 08/04/18  Yes Fisher, Kirstie Peri, MD  Cholecalciferol (VITAMIN D3) 1.25 MG (50000 UT) CAPS Take 50,000 Units by mouth once a week. 08/27/18  Yes Cannady, Jolene T, NP  fenofibrate (TRICOR) 48 MG tablet TAKE 1 TABLET BY MOUTH ONCE DAILY Patient taking differently: Take 48 mg by mouth daily.  02/23/19  Yes Cannady, Jolene T, NP  fluticasone (FLOVENT HFA) 110 MCG/ACT inhaler Inhale 1 puff into the lungs  2 (two) times daily. Patient taking differently: Inhale 1 puff into the lungs daily.  03/31/18  Yes Birdie Sons, MD  gabapentin (NEURONTIN) 300 MG capsule TAKE 1 CAPSULE BY MOUTH AT BEDTIME FOR ONE WEEK  THEN TAKE ONE CAPSULE TWICE DAILY Patient taking differently: Take 300 mg by mouth 2 (two) times daily.  06/24/18  Yes Birdie Sons, MD  lisinopril (ZESTRIL) 20 MG tablet TAKE 1 TABLET BY MOUTH ONCE DAILY Patient taking differently: Take 20 mg by mouth daily.  08/20/18  Yes Birdie Sons, MD  meloxicam (MOBIC) 15 MG tablet TAKE 1 TABLET BY MOUTH ONCE DAILY Patient taking differently: Take 15 mg by mouth daily.  02/23/19  Yes Cannady, Jolene T, NP  montelukast (SINGULAIR) 10 MG tablet TAKE 1 TABLET BY MOUTH ONCE EVERY EVENING AT BEDTIME. Patient taking  differently: Take 10 mg by mouth at bedtime.  01/13/19  Yes Birdie Sons, MD  omeprazole (PRILOSEC) 20 MG capsule TAKE 1 CAPSULE BY MOUTH ONCE DAILY Patient taking differently: Take 20 mg by mouth daily.  09/09/17  Yes Birdie Sons, MD  OneTouch Delica Lancets 25K MISC TEST ONCE A DAY. 06/06/18  Yes Birdie Sons, MD  [facility] DELICA LANCETS FINE MISC TEST once daily 04/23/16  Yes Birdie Sons, MD  [facility] ULTRA test strip TEST ONCE DAILY 11/15/18  Yes Birdie Sons, MD  simvastatin (ZOCOR) 40 MG tablet TAKE 1 TABLET BY MOUTH ONCE DAILY. Patient taking differently: Take 40 mg by mouth daily.  01/13/19  Yes Birdie Sons, MD  clobetasol cream (TEMOVATE) 2.70 % Apply 1 application topically 2 (two) times daily. Patient not taking: Reported on 12/02/2018 10/01/18   Marnee Guarneri T, NP  EPINEPHrine 0.3 mg/0.3 mL IJ SOAJ injection Inject 0.3 mLs (0.3 mg total) into the muscle once as needed for up to 1 dose. Patient not taking: Reported on 12/02/2018 11/29/17   Birdie Sons, MD  loratadine (CLARITIN) 10 MG tablet Take 1 tablet (10 mg total) by mouth daily. Patient not taking: Reported on 12/02/2018 10/01/18   Marnee Guarneri T, NP  nitroGLYCERIN (NITROSTAT) 0.4 MG SL tablet Place 1 tablet (0.4 mg total) under the tongue every 5 (five) minutes as needed for chest pain. Patient not taking: Reported on 12/02/2018 08/27/17   Birdie Sons, MD  PROAIR HFA 108 [phone] Base) MCG/ACT inhaler INHALE 2 PUFFS BY MOUTH EVERY 6 HOURS IF NEEDED FOR COUGH OR WHEEZING. Patient not taking: Reported on 12/02/2018 11/13/17   Birdie Sons, MD     Scheduled: .  stroke: mapping our early stages of recovery book   Does not apply Once  . aspirin EC  81 mg Oral Daily  . budesonide (PULMICORT) nebulizer solution  0.25 mg Nebulization BID  . enoxaparin (LOVENOX) injection  40 mg Subcutaneous Q24H  . [START ON 03/03/2019] fenofibrate  54 mg Oral Daily  . gabapentin  300 mg Oral BID  . insulin aspart  0-9  Units Subcutaneous TID WC  . pantoprazole  40 mg Oral Daily  . simvastatin  40 mg Oral Daily  . sodium chloride flush  3 mL Intravenous Once    ROS: History obtained from the patient  General ROS: negative for - chills, fatigue, fever, night sweats, weight gain or weight loss Psychological ROS: negative for - behavioral disorder, hallucinations, memory difficulties, mood swings or suicidal ideation Ophthalmic ROS: negative for - blurry vision, double vision, eye pain or loss of vision ENT ROS: negative for -  epistaxis, nasal discharge, oral lesions, sore throat, tinnitus or vertigo Allergy and Immunology ROS: negative for - hives or itchy/watery eyes Hematological and Lymphatic ROS: negative for - bleeding problems, bruising or swollen lymph nodes Endocrine ROS: negative for - galactorrhea, hair pattern changes, polydipsia/polyuria or temperature intolerance Respiratory ROS: negative for - cough, hemoptysis, shortness of breath or wheezing Cardiovascular ROS: negative for - chest pain, dyspnea on exertion, edema or irregular heartbeat Gastrointestinal ROS: negative for - abdominal pain, diarrhea, hematemesis, nausea/vomiting or stool incontinence Genito-Urinary ROS: negative for - dysuria, hematuria, incontinence or urinary frequency/urgency Musculoskeletal ROS: negative for - joint swelling or muscular weakness Neurological ROS: as noted in HPI Dermatological ROS: negative for rash and skin lesion changes  Physical Examination: Blood pressure (!) 149/76, pulse (!) 48, temperature 97.6 F (36.4 C), temperature source Oral, resp. rate 14, height 5' 4" (1.626 m), weight 79.4 kg, SpO2 98 %.  HEENT-  Normocephalic, no lesions, without obvious abnormality.  Normal external eye and conjunctiva.  Normal TM's bilaterally.  Normal auditory canals and external ears. Normal external nose, mucus membranes and septum.  Normal pharynx. Cardiovascular- S1, S2 normal, pulses palpable throughout    Lungs- chest clear, no wheezing, rales, normal symmetric air entry Abdomen- soft, non-tender; bowel sounds normal; no masses,  no organomegaly Extremities- no edema Lymph-no adenopathy palpable Musculoskeletal-no joint tenderness, deformity or swelling Skin-warm and dry, no hyperpigmentation, vitiligo, or suspicious lesions  Neurological Examination   Mental Status: Lethargic, oriented, thought content appropriate.  Speech fluent without evidence of aphasia.  Able to follow 3 step commands without difficulty. Cranial Nerves: II: Discs flat bilaterally; Visual fields grossly normal, pupils equal, round, reactive to light and accommodation III,IV, VI: no ptosis, extra-ocular motions intact bilaterally with extensive nystagmus in all directions V,VII: right facial droop, facial light touch sensation decreased on the right VIII: hearing normal bilaterally IX,X: gag reflex reduced XI: bilateral shoulder shrug XII: midline tongue extension Motor: Patient provides full strength on the left upper and lower extremities.  Unable to provide resistance on the right upper and lower extremities.  External rotation of the RLE Sensory: Pinprick and light touch decreased on the right Deep Tendon Reflexes: Symmetric throughout Plantars: Right: mute   Left: mute Cerebellar: Due to vision difficulty with finger to nose.  Heel to shin intact.   Gait: not tested due to safety concerns    Laboratory Studies:  Basic Metabolic Panel: Recent Labs  Lab 02/24/19 1522 03/02/19 0311  NA 137 136  K 4.3 4.0  CL 102 103  CO2 20 24  GLUCOSE 222* 274*  BUN 16 15  CREATININE 1.38* 1.20  CALCIUM 9.6 9.1    Liver Function Tests: Recent Labs  Lab 02/24/19 1522 03/02/19 0311  AST 25 31  ALT 36 33  ALKPHOS 139* 120  BILITOT 0.5 0.5  PROT 6.9 6.7  ALBUMIN 4.4 4.1   No results for input(s): LIPASE, AMYLASE in the last 168 hours. No results for input(s): AMMONIA in the last 168 hours.  CBC: Recent  Labs  Lab 03/02/19 0311  WBC 9.2  NEUTROABS 7.1  HGB 14.4  HCT 40.5  MCV 86.7  PLT 165    Cardiac Enzymes: No results for input(s): CKTOTAL, CKMB, CKMBINDEX, TROPONINI in the last 168 hours.  BNP: Invalid input(s): POCBNP  CBG: No results for input(s): GLUCAP in the last 168 hours.  Microbiology: No results found for this or any previous visit.  Coagulation Studies: Recent Labs    03/02/19 0311  LABPROT  13.2  INR 1.0    Urinalysis: No results for input(s): COLORURINE, LABSPEC, PHURINE, GLUCOSEU, HGBUR, BILIRUBINUR, KETONESUR, PROTEINUR, UROBILINOGEN, NITRITE, LEUKOCYTESUR in the last 168 hours.  Invalid input(s): APPERANCEUR  Lipid Panel:    Component Value Date/Time   CHOL 136 12/02/2018 1408   TRIG 145 12/02/2018 1408   HDL 34 (L) 08/27/2018 1604   CHOLHDL 3.9 08/04/2018 1458   VLDL 229 (H) 12/02/2018 1408   LDLCALC Comment 08/27/2018 1604    HgbA1C:  Lab Results  Component Value Date   HGBA1C 7.7 (H) 02/24/2019    Urine Drug Screen:  No results found for: LABOPIA, COCAINSCRNUR, LABBENZ, AMPHETMU, THCU, LABBARB  Alcohol Level: No results for input(s): ETH in the last 168 hours.  Other results: EKG: sinus rhythm at 53 bpm.  Imaging: CT HEAD WO CONTRAST  Result Date: 03/02/2019 CLINICAL DATA:  Ataxia, stroke suspected Possible stroke Headache, slowing, right-sided weakness. EXAM: CT HEAD WITHOUT CONTRAST TECHNIQUE: Contiguous axial images were obtained from the base of the skull through the vertex without intravenous contrast. COMPARISON:  None. FINDINGS: Brain: No intracranial hemorrhage, mass effect, or midline shift. No hydrocephalus. The basilar cisterns are patent. No evidence of territorial infarct or acute ischemia. No extra-axial or intracranial fluid collection. Vascular: No hyperdense vessel or unexpected calcification. Skull: Normal. Negative for fracture or focal lesion. Sinuses/Orbits: Paranasal sinuses and mastoid air cells are clear. The  visualized orbits are unremarkable. Other: None. IMPRESSION: Unremarkable noncontrast head CT. Electronically Signed   By: Keith Rake M.D.   On: 03/02/2019 02:45   MR BRAIN WO CONTRAST  Result Date: 03/02/2019 CLINICAL DATA:  Acute headache with normal neuro exam. EXAM: MRI HEAD WITHOUT CONTRAST TECHNIQUE: Multiplanar, multiecho pulse sequences of the brain and surrounding structures were obtained without intravenous contrast. COMPARISON:  Head CT from earlier today FINDINGS: Brain: Restricted diffusion in the lateral right medulla. No pre-existing infarct. No acute hemorrhage, hydrocephalus, or masslike finding. Two or 3 remote white matter insults that are tiny and nonspecific. Brain volume is normal. Vascular: Preserved flow voids. The left vertebral artery is dominant. The right vertebral artery is difficult to assess given small size, but appears to have a preserved flow void at the level of the V3 segment on axial T2 weighted imaging. Skull and upper cervical spine: Normal marrow signal Sinuses/Orbits: Gaze preference to the right. Other: Progressively motion degraded IMPRESSION: Acute right lateral medullary infarct. Electronically Signed   By: Monte Fantasia M.D.   On: 03/02/2019 09:37    Assessment: 58 y.o. male with a history of HTN, HLD and DM presenting with headache, dizziness, and right hemiparesis.  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  Likely secondary to small vessel disease.  Patient on no antiplatelet therapy prior to admission.   Further work up recommended.    Stroke Risk Factors - diabetes mellitus, hyperlipidemia and hypertension  Plan: 1. HgbA1c, fasting lipid panel pending.  Target A1c<7.0, target LDL<70.   2. Patient previously on a statin.  Would continue.   3. PT consult, OT consult, Speech consult 4. Echocardiogram 5. Carotid dopplers pending.  Would recommend CTA of head and neck for evaluation of the  posterior circulation 6. Prophylactic therapy-Dual antiplatelet therapy with ASA 58m and Plavix 759mfor three weeks with change to ASA 8135maily alone as monotherapy after that time. 7. NPO until RN stroke swallow screen 8. Telemetry monitoring 9. Frequent neuro checks   LesAlexis GoodellD Neurology [id number]/14/2020,  11:59 AM

## 2019-03-02 NOTE — ED Notes (Signed)
Patient transported to CT

## 2019-03-02 NOTE — Progress Notes (Signed)
SLP Cancellation Note  Patient Details Name: [name]. MRN: [id number] DOB: [birth year]   Cancelled treatment:       Reason Eval/Treat Not Completed: (chart reviewed; consulted NSG then observed pt). Pt was sleeping w/ eyes closed; NSG reported he had had a headache earlier.  NSG reported he was verbally communicating adequately w/ them all of his wants/needs; pt is mentally challenged at baseline per H&P. NSG reported pt passed the [place] swallow screen, but pt stated min discomfort when drinking a soda(possibly the carbonation?). ST services will f/u w/ pt tomorrow for Cognitive screen/eval and discuss any swallowing concerns. NSG agreed.    Orinda Kenner, MS, CCC-SLP Meredyth Hornung 03/02/2019, 3:49 PM

## 2019-03-02 NOTE — ED Notes (Signed)
When tracking pt keeps closing eyes. Had to multiple times remind pt to open eyes and keep tracking. Eyes seem to just roll from one side to the other when not forcing himself to actively track.  C/o being dizzy.  Not fixed to one side.

## 2019-03-02 NOTE — ED Notes (Signed)
Attempting to assist pt to BR. Pt began leaning toward R side and when asked to sit down on bed, pt began actively resisting attempted guidance toward bed until other staff members arrived. Pt then was able to straighten legs and be guided to sit on edge of bed.

## 2019-03-02 NOTE — ED Notes (Signed)
Spoke with MRI, pt waiting on ankle monitor to be removed so can have his MRI.  Working on contacting appropriate service to get it removed.  Verbal permission from pt to inform probation about stroke work up so that can be removed timely.

## 2019-03-02 NOTE — ED Notes (Addendum)
Called and updated pt's spouse w/ permission from pt.

## 2019-03-02 NOTE — Progress Notes (Signed)
PT Cancellation Note  Patient Details Name: [name]. MRN: [id number] DOB: [birth year]   Cancelled Treatment:    Reason Eval/Treat Not Completed: Other (comment)(Patient out of room at this time. PT to follow up as able.)  Lieutenant Diego PT, DPT 1:33 PM,03/02/19 [phone]

## 2019-03-02 NOTE — ED Notes (Signed)
Report received from [facility].

## 2019-03-02 NOTE — Progress Notes (Signed)
03/02/19 1745  Clinical Encounter Type  Visited With Patient  Visit Type Follow-up  Referral From Chaplain  Consult/Referral To Chaplain  Pt was sleeping peacefully upon arrival but woke up to verbal stimuli. [place] introduced self and shared with patient message that was relayed to the author upon shift change. Pastoral visit was appreciated. No further needs at this time.

## 2019-03-02 NOTE — ED Notes (Signed)
[name] at bedside at this time

## 2019-03-02 NOTE — ED Notes (Signed)
Provided pt w/ meal tray and TV remote.

## 2019-03-02 NOTE — ED Notes (Signed)
Pt sleeping.

## 2019-03-02 NOTE — ED Notes (Signed)
Received permission from Officer Edmunson to cut ankle bracelet off of pt to go to MRI.

## 2019-03-02 NOTE — ED Notes (Signed)
Patient transported to MRI

## 2019-03-02 NOTE — ED Notes (Signed)
Pt stated the room looks like it is spinning as I was repositioning in the bed.

## 2019-03-02 NOTE — H&P (Signed)
History and Physical    [name]. VVO:[id number] DOB: [birth year] DOA: 03/02/2019  I have briefly reviewed the patient's prior medical records in [place]  PCP: [name], NP  Patient coming from: home  Chief Complaint: headache, weakness  HPI: [name]. is a 58 y.o. male with medical history significant of type 2 diabetes mellitus, hypertension, hyperlipidemia, mentally challenged, phenylketonuria, presents to the hospital with chief complaint of headache and weakness since yesterday.  Patient reports that had a fall a few days ago and has been having headaches since.  He also reports dizziness.  He also feels weak mainly on the right side and tends to lean towards the right when he is walking.  He denies any chest pain, he denies any shortness of breath, denies any fever or chills.  He denies any numbness or tingling.  He denies any visual changes however tends to keep his eyes closed during my interaction and tells me is because of his cataracts.  He is overall a poor historian  ED Course: In the emergency room he is afebrile 97.6, heart rate is in the 50s, blood pressure is normal-hypertensive and he is satting well on room air.  His blood work shows a CBG of 274 but otherwise is unremarkable. He underwent an MRI of the brain that showed acute right lateral medullary infarct.  We are asked to admit for stroke  Review of Systems: All systems reviewed, and apart from HPI, all negative  Past Medical History:  Diagnosis Date  . Arrhythmia   . Asthma   . Cyst of kidney, acquired   . Diabetes mellitus without complication ([place]) 2694   type 2  . GERD (gastroesophageal reflux disease)   . History of chicken pox   . History of measles as a child   . History of PKU   . Hyperlipidemia   . Hypertension   . IBS (irritable bowel syndrome)   . Irregular heart beat   . Mentally challenged   . Pancreatitis     Past Surgical History:  Procedure  Laterality Date  . Cardiac Catherization     [facility]  . CARDIAC CATHETERIZATION     [facility]  . COLONOSCOPY    . ESOPHAGOGASTRODUODENOSCOPY (EGD) WITH PROPOFOL N/A 12/27/2016   Procedure: ESOPHAGOGASTRODUODENOSCOPY (EGD) WITH PROPOFOL;  Surgeon: Lin Landsman, MD;  Location: [facility];  Service: Gastroenterology;  Laterality: N/A;  . HEMORRHOID SURGERY    . Ligament Removal Left    of left thumb: dr. Cleda Mccreedy  . ligament removal  of left thumb     Dr. Cleda Mccreedy  . NM GATED MYOCARDIAL STUDY (ARMX HX)  06/23/2014   [place]. Normal     reports that he has never smoked. He has never used smokeless tobacco. He reports that he does not drink alcohol or use drugs.  Allergies  Allergen Reactions  . Reglan [Metoclopramide]     Extrapyramidal side effects  . Bee Venom Hives    All kinds of bees  . Other     Certain powders  . Sulfa Antibiotics     Family History  Problem Relation Age of Onset  . Cancer Mother        throat  . Diabetes Father   . Heart disease Father   . Cancer Maternal Grandmother   . Cancer Maternal Grandfather        pancreatic  . Cancer Paternal Grandfather     Prior to Admission medications  Medication Sig Start Date End Date Taking? Authorizing Provider  canagliflozin (INVOKANA) 300 MG TABS tablet Take 1 tablet (300 mg total) by mouth daily before breakfast. 08/04/18   Birdie Sons, MD  Cholecalciferol (VITAMIN D3) 1.25 MG (50000 UT) CAPS Take 50,000 Units by mouth once a week. 08/27/18   Cannady, Henrine Screws T, NP  clobetasol cream (TEMOVATE) 0.98 % Apply 1 application topically 2 (two) times daily. Patient not taking: Reported on 12/02/2018 10/01/18   Marnee Guarneri T, NP  EPINEPHrine 0.3 mg/0.3 mL IJ SOAJ injection Inject 0.3 mLs (0.3 mg total) into the muscle once as needed for up to 1 dose. Patient not taking: Reported on 12/02/2018 11/29/17   Birdie Sons, MD  fenofibrate (TRICOR) 48 MG tablet TAKE 1 TABLET BY MOUTH ONCE DAILY 02/23/19   Cannady,  Jolene T, NP  fluticasone (FLOVENT HFA) 110 MCG/ACT inhaler Inhale 1 puff into the lungs 2 (two) times daily. 03/31/18   Birdie Sons, MD  gabapentin (NEURONTIN) 300 MG capsule TAKE 1 CAPSULE BY MOUTH AT BEDTIME FOR ONE WEEK  THEN TAKE ONE CAPSULE TWICE DAILY 06/24/18   Birdie Sons, MD  lisinopril (ZESTRIL) 20 MG tablet TAKE 1 TABLET BY MOUTH ONCE DAILY 08/20/18   Birdie Sons, MD  loratadine (CLARITIN) 10 MG tablet Take 1 tablet (10 mg total) by mouth daily. Patient not taking: Reported on 12/02/2018 10/01/18   Marnee Guarneri T, NP  meloxicam (MOBIC) 15 MG tablet TAKE 1 TABLET BY MOUTH ONCE DAILY 02/23/19   Cannady, Jolene T, NP  montelukast (SINGULAIR) 10 MG tablet TAKE 1 TABLET BY MOUTH ONCE EVERY EVENING AT BEDTIME. 01/13/19   Birdie Sons, MD  nitroGLYCERIN (NITROSTAT) 0.4 MG SL tablet Place 1 tablet (0.4 mg total) under the tongue every 5 (five) minutes as needed for chest pain. Patient not taking: Reported on 12/02/2018 08/27/17   Birdie Sons, MD  omeprazole (PRILOSEC) 20 MG capsule TAKE 1 CAPSULE BY MOUTH ONCE DAILY 09/09/17   Birdie Sons, MD  OneTouch Delica Lancets 11B MISC TEST ONCE A DAY. 06/06/18   Birdie Sons, MD  [facility] DELICA LANCETS FINE MISC TEST once daily 04/23/16   Birdie Sons, MD  [facility] ULTRA test strip TEST ONCE DAILY 11/15/18   Birdie Sons, MD  PROAIR HFA 108 [phone] Base) MCG/ACT inhaler INHALE 2 PUFFS BY MOUTH EVERY 6 HOURS IF NEEDED FOR COUGH OR WHEEZING. Patient not taking: Reported on 12/02/2018 11/13/17   Birdie Sons, MD  simvastatin (ZOCOR) 40 MG tablet TAKE 1 TABLET BY MOUTH ONCE DAILY. 01/13/19   Birdie Sons, MD    Physical Exam: Vitals:   03/02/19 0600 03/02/19 0700 03/02/19 0800 03/02/19 0944  BP: (!) 147/78 136/71 124/74 122/60  Pulse: (!) 52 (!) 53 (!) 56 (!) 49  Resp: _0 Temp:      TempSrc:      SpO2: 100% 97% 95% 99%  Weight:      Height:        Constitutional: NAD, calm, comfortable Eyes: lids and  conjunctivae normal ENMT: Mucous membranes are moist. Neck: normal, supple Respiratory: clear to auscultation bilaterally, no wheezing, no crackles. Normal respiratory effort. Cardiovascular: Regular rate and rhythm, no murmurs / rubs / gallops. No extremity edema.  Abdomen: no tenderness, no masses palpated. Bowel sounds positive.  Musculoskeletal: no clubbing / cyanosis. Normal muscle tone.  Skin: no rashes, lesions, ulcers. No induration Neurologic: Equal strength, 5/5, no focal deficits.  Nystagmus present Psychiatric: Alert and oriented x3  Labs on Admission: I have personally reviewed following labs and imaging studies  CBC: Recent Labs  Lab 03/02/19 0311  WBC 9.2  NEUTROABS 7.1  HGB 14.4  HCT 40.5  MCV 86.7  PLT 643   Basic Metabolic Panel: Recent Labs  Lab 02/24/19 1522 03/02/19 0311  NA 137 136  K 4.3 4.0  CL 102 103  CO2 20 24  GLUCOSE 222* 274*  BUN 16 15  CREATININE 1.38* 1.20  CALCIUM 9.6 9.1   Liver Function Tests: Recent Labs  Lab 02/24/19 1522 03/02/19 0311  AST 25 31  ALT 36 33  ALKPHOS 139* 120  BILITOT 0.5 0.5  PROT 6.9 6.7  ALBUMIN 4.4 4.1   Coagulation Profile: Recent Labs  Lab 03/02/19 0311  INR 1.0   BNP (last 3 results) No results for input(s): PROBNP in the last 8760 hours. CBG: No results for input(s): GLUCAP in the last 168 hours. Thyroid Function Tests: No results for input(s): TSH, T4TOTAL, FREET4, T3FREE, THYROIDAB in the last 72 hours. Urine analysis:    Component Value Date/Time   COLORURINE STRAW (A) 11/28/2015 0143   APPEARANCEUR CLEAR (A) 11/28/2015 0143   LABSPEC 1.005 11/28/2015 0143   PHURINE 7.0 11/28/2015 0143   GLUCOSEU >500 (A) 11/28/2015 0143   HGBUR NEGATIVE 11/28/2015 0143   BILIRUBINUR small 10/31/2017 0937   KETONESUR NEGATIVE 11/28/2015 0143   PROTEINUR Positive (A) 10/31/2017 0937   PROTEINUR NEGATIVE 11/28/2015 0143   UROBILINOGEN 1.0 10/31/2017 0937   NITRITE negative 10/31/2017 0937    NITRITE NEGATIVE 11/28/2015 0143   LEUKOCYTESUR Trace (A) 10/31/2017 0937     Radiological Exams on Admission: CT HEAD WO CONTRAST  Result Date: 03/02/2019 CLINICAL DATA:  Ataxia, stroke suspected Possible stroke Headache, slowing, right-sided weakness. EXAM: CT HEAD WITHOUT CONTRAST TECHNIQUE: Contiguous axial images were obtained from the base of the skull through the vertex without intravenous contrast. COMPARISON:  None. FINDINGS: Brain: No intracranial hemorrhage, mass effect, or midline shift. No hydrocephalus. The basilar cisterns are patent. No evidence of territorial infarct or acute ischemia. No extra-axial or intracranial fluid collection. Vascular: No hyperdense vessel or unexpected calcification. Skull: Normal. Negative for fracture or focal lesion. Sinuses/Orbits: Paranasal sinuses and mastoid air cells are clear. The visualized orbits are unremarkable. Other: None. IMPRESSION: Unremarkable noncontrast head CT. Electronically Signed   By: Keith Rake M.D.   On: 03/02/2019 02:45   MR BRAIN WO CONTRAST  Result Date: 03/02/2019 CLINICAL DATA:  Acute headache with normal neuro exam. EXAM: MRI HEAD WITHOUT CONTRAST TECHNIQUE: Multiplanar, multiecho pulse sequences of the brain and surrounding structures were obtained without intravenous contrast. COMPARISON:  Head CT from earlier today FINDINGS: Brain: Restricted diffusion in the lateral right medulla. No pre-existing infarct. No acute hemorrhage, hydrocephalus, or masslike finding. Two or 3 remote white matter insults that are tiny and nonspecific. Brain volume is normal. Vascular: Preserved flow voids. The left vertebral artery is dominant. The right vertebral artery is difficult to assess given small size, but appears to have a preserved flow void at the level of the V3 segment on axial T2 weighted imaging. Skull and upper cervical spine: Normal marrow signal Sinuses/Orbits: Gaze preference to the right. Other: Progressively motion  degraded IMPRESSION: Acute right lateral medullary infarct. Electronically Signed   By: Monte Fantasia M.D.   On: 03/02/2019 09:37    EKG: Independently reviewed.  Sinus bradycardia  Assessment/Plan  Principal Problem Acute CVA -Patient will be admitted  to the hospital for stroke protocol, neurology has been consulted -Complete work-up with carotid ultrasound, 2D echo, PT/OT, lipid panel, A1c  Active Problems Type 2 diabetes mellitus -Placed on sliding scale insulin, check an A1c  Hyperlipidemia -continue statin, check lipid panel  Essential hypertension -allow permissive hypertension   DVT prophylaxis: Lovenox  Code Status: Full code  Family Communication: Updated wife over the phone Disposition Plan: Home when ready Bed Type: MedSurg with cardiac monitoring Consults called: Neurology, Dr. Doy Mince Obs/Inp: Inpatient  Marzetta Board, MD, PhD [facility]  Contact via [email or web address]  03/02/2019, 10:43 AM

## 2019-03-02 NOTE — Progress Notes (Signed)
OT Cancellation Note  Patient Details Name: [name]. MRN: [id number] DOB: [birth year]   Cancelled Treatment:    Reason Eval/Treat Not Completed: Patient at procedure or test/ unavailable. Order received, chart reviewed. Pt out of room for testing. Will re-attempt OT evaluation at later date/time as pt is available and medically appropriate.  Jeni Salles, MPH, MS, OTR/L ascom [phone] 03/02/19, 1:34 PM

## 2019-03-02 NOTE — ED Notes (Addendum)
Admitting MD Cruzita Lederer will call and update wife on patient results.

## 2019-03-02 NOTE — ED Notes (Signed)
OT at bedside.

## 2019-03-02 NOTE — Evaluation (Signed)
Occupational Therapy Evaluation Patient Details Name: [name]. MRN: [id number] DOB: 25-Aug-1960 Today's Date: 03/02/2019    History of Present Illness 58 y.o. male with a history of HTN, HLD and DM who reports being at baseline until coming out of the shower at 0100 this morning.  He was coming out of the shower when he developed sudden severe headache that he described as throbbing, sharp, generalized, 10 out of 10, associated with dizziness.  Patient reports that he felt like his R leg was weaker than the L and he was unable to walk straight without leaning to the right. Has been having headaches over the past 2-3 weeks without associated focal neurological symptoms. 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   Clinical Impression   Pt seen for OT evaluation this date. Per pt, he was independent with basic ADL and driving prior to admission. He reports he assists his spouse with ADL and an aide comes to assist with cooking/clean for his spouse. Pt endorses 1 fall in past 6 months, just prior to this admission. Pt alert and oriented x3 (thought it was November). Follows commands fairly well. Requires encouragement throughout session to keep his eyes open. When open, eyes moving in all directions and difficulty with focusing on objects. Pt able to correctly relay number of fingers held up by therapist, difficulty with visual tracking and hand-eye coordination with finger to nose test. Later pt reports that therapist is "upside down" when pt looks at therapist. Pt demo's impairments in strength/coordination/sensation on R side and pt endorses decreased sensation in LUE as well. Pt declines bed mobility attempts 2/2 headache. RN notified of pain, per pt request. Pt requiring increased assist for ADL tasks 2/2 impairments. Will benefit from skilled OT services to maximize return to PLOF, minimize caregiver burden, and minimize falls risk. Per pt, decreased caregiver support available at  home for him, let alone his wife. Recommend SNF for continued therapy upon discharge.     Follow Up Recommendations  SNF    Equipment Recommendations  3 in 1 bedside commode    Recommendations for Other Services PT consult     Precautions / Restrictions Precautions Precautions: Fall Restrictions Weight Bearing Restrictions: No      Mobility Bed Mobility               General bed mobility comments: pt declined 2/2 headache and fatigue  Transfers                 General transfer comment: pt declined 2/2 headache and fatigue    Balance                                           ADL either performed or assessed with clinical judgement   ADL Overall ADL's : Needs assistance/impaired                                       General ADL Comments: fxl mobility deferred 2/2 pt declining (headache), Max A for LB ADL, Min A UB ADL     Vision Baseline Vision/History: Wears glasses Wears Glasses: At all times Patient Visual Report: Other (comment)(reports images upside down, eyes won't stop moving) Vision Assessment?: Yes Eye Alignment: Impaired (comment)(eyes constantly moving, nystagmus in all directions) Ocular Range of  Motion: Impaired-to be further tested in functional context Tracking/Visual Pursuits: Impaired - to be further tested in functional context Depth Perception: Overshoots Additional Comments: pt unable to visually track or keep eyes stabilized on stationary objects     Perception     Praxis      Pertinent Vitals/Pain Pain Assessment: 0-10 Pain Score: 10-Worst pain ever Pain Descriptors / Indicators: Aching;Headache Pain Intervention(s): Limited activity within patient's tolerance;Monitored during session;Patient requesting pain meds-RN notified     Hand Dominance Left   Extremity/Trunk Assessment Upper Extremity Assessment Upper Extremity Assessment: Generalized weakness;RUE deficits/detail RUE Deficits  / Details: grossly 4-/5 at shoulder, 4/5 elbow flex/ext, grip; reports impaired sensation and impaired FMC with testing RUE Sensation: decreased light touch RUE Coordination: decreased fine motor;decreased gross motor   Lower Extremity Assessment Lower Extremity Assessment: Defer to PT evaluation;Generalized weakness;RLE deficits/detail RLE Deficits / Details: grossly at least 3+/5, impaired sensation per pt RLE Sensation: decreased light touch RLE Coordination: decreased gross motor       Communication Communication Communication: No difficulties   Cognition Arousal/Alertness: Awake/alert Behavior During Therapy: WFL for tasks assessed/performed Overall Cognitive Status: Within Functional Limits for tasks assessed                                 General Comments: alert and oriented, follows commands well; per chart pt with hx of cognitive impairments   General Comments       Exercises     Shoulder Instructions      Home Living Family/patient expects to be discharged to:: Private residence Living Arrangements: Spouse/significant other Available Help at Discharge: (spouse requires assist from pt/aide for ADL and IADL; per pt, no family available to assist pt) Type of Home: Mobile home Home Access: Ramped entrance     Home Layout: One level     Bathroom Shower/Tub: Teacher, early years/pre: Standard     Home Equipment: Environmental consultant - 2 wheels;Hand held shower head          Prior Functioning/Environment Level of Independence: Independent        Comments: Pt reports indep with ADL, mobility, and is primary caregiver for spouse for ADL, aide assists pt's spouse with meal prep/cleaning; 1 fall just prior to this admission        OT Problem List: Decreased strength;Decreased coordination;Pain;Impaired sensation;Impaired UE functional use;Impaired vision/perception      OT Treatment/Interventions: Self-care/ADL training;Therapeutic  exercise;Therapeutic activities;Neuromuscular education;Visual/perceptual remediation/compensation;DME and/or AE instruction;Patient/family education;Balance training    OT Goals(Current goals can be found in the care plan section) Acute Rehab OT Goals Patient Stated Goal: to see better OT Goal Formulation: With patient Time For Goal Achievement: 03/16/19 Potential to Achieve Goals: Good ADL Goals Pt Will Perform Lower Body Dressing: with min assist;sit to/from stand Pt Will Transfer to Toilet: with min assist;bedside commode;ambulating(LRAD for amb) Additional ADL Goal #1: Pt will perform [facility] for RUE with supervision and PRN cues for technique to improve RUE [place].  OT Frequency: Min 2X/week   Barriers to D/C:            Co-evaluation              AM-PAC OT "6 Clicks" Daily Activity     Outcome Measure Help from another person eating meals?: None Help from another person taking care of personal grooming?: A Little Help from another person toileting, which includes using toliet, bedpan, or urinal?: A Lot Help  from another person bathing (including washing, rinsing, drying)?: A Lot Help from another person to put on and taking off regular upper body clothing?: A Little Help from another person to put on and taking off regular lower body clothing?: A Lot 6 Click Score: 16   End of Session Nurse Communication: Patient requests pain meds  Activity Tolerance: Patient limited by pain Patient left: in bed;with call bell/phone within reach  OT Visit Diagnosis: Other abnormalities of gait and mobility (R26.89);Hemiplegia and hemiparesis;Low vision, both eyes (H54.2);History of falling (Z91.81) Hemiplegia - Right/Left: Right Hemiplegia - dominant/non-dominant: Non-Dominant Hemiplegia - caused by: Cerebral infarction                Time: 9562-1308 OT Time Calculation (min): 21 min Charges:  OT General Charges $OT Visit: 1 Visit OT Evaluation $OT Eval Moderate Complexity: 1  Mod  Jeni Salles, MPH, MS, OTR/L ascom [phone] 03/02/19, 3:55 PM

## 2019-03-02 NOTE — ED Provider Notes (Signed)
Acute infarct noted on MRI, will consult hospitalist service   Lavonia Drafts, MD 03/02/19 [phone]

## 2019-03-02 NOTE — ED Notes (Signed)
Hx of sinus brady, pt reports 10/10 worst HA ever, pt report falling out of a trailer 2-3 weeks prior with injury to head and neck and "thar's when the pain started"

## 2019-03-03 ENCOUNTER — Ambulatory Visit: Payer: Medicare Other | Admitting: Nurse Practitioner

## 2019-03-03 ENCOUNTER — Encounter: Payer: Self-pay | Admitting: Internal Medicine

## 2019-03-03 ENCOUNTER — Other Ambulatory Visit: Payer: Self-pay

## 2019-03-03 ENCOUNTER — Ambulatory Visit: Payer: Self-pay | Admitting: Pharmacist

## 2019-03-03 LAB — HIV ANTIBODY (ROUTINE TESTING W REFLEX): HIV Screen 4th Generation wRfx: NONREACTIVE

## 2019-03-03 LAB — ECHOCARDIOGRAM COMPLETE
Height: 64 in
Weight: 2800 oz

## 2019-03-03 LAB — GLUCOSE, CAPILLARY
Glucose-Capillary: 175 mg/dL — ABNORMAL HIGH (ref 70–99)
Glucose-Capillary: 166 mg/dL — ABNORMAL HIGH (ref 70–99)
Glucose-Capillary: 167 mg/dL — ABNORMAL HIGH (ref 70–99)
Glucose-Capillary: 159 mg/dL — ABNORMAL HIGH (ref 70–99)
Glucose-Capillary: 233 mg/dL — ABNORMAL HIGH (ref 70–99)

## 2019-03-03 LAB — LIPID PANEL
Cholesterol: 159 mg/dL (ref 0–200)
HDL: 30 mg/dL — ABNORMAL LOW (ref >40)
LDL Cholesterol: 85 mg/dL (ref 0–99)
Total CHOL/HDL Ratio: 5.3 RATIO
Triglycerides: 222 mg/dL — ABNORMAL HIGH (ref <150)
VLDL: 44 mg/dL — ABNORMAL HIGH (ref 0–40)

## 2019-03-03 LAB — HEMOGLOBIN A1C
Hgb A1c MFr Bld: 8.6 % — ABNORMAL HIGH (ref 4.8–5.6)
Mean Plasma Glucose: 200.12 mg/dL

## 2019-03-03 MED ORDER — ONDANSETRON HCL 4 MG/2ML IJ SOLN
4.0000 mg | Freq: Four times a day (QID) | INTRAMUSCULAR | Status: DC | PRN
  Administered 2019-03-04 – 2019-03-05 (×3): 4 mg via INTRAVENOUS
  Filled 2019-03-03 (×3): qty 2

## 2019-03-03 NOTE — Progress Notes (Signed)
PROGRESS NOTE    [name].  [name] DOB: [birth year] DOA: 03/02/2019 PCP: [name], NP    Brief Narrative:  Alex Tosh. is a 58 y.o. male with medical history significant of type 2 diabetes mellitus, hypertension, hyperlipidemia, mentally challenged, phenylketonuria, presents to the hospital with chief complaint of headache and weakness since yesterday.  Patient reports that had a fall a few days ago and has been having headaches since.  He also reports dizziness.  He also feels weak mainly on the right side and tends to lean towards the right when he is walking.   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  We are asked to admit for stroke. Neurology was consulted    Consultants:   neurology  Procedures: MRI/CTA  Antimicrobials:   none   Subjective: Pt reports when he "tries to stand up, he leans towards his right", . Has some HA today but not much dizziness. No other complaints.  Objective: Vitals:   03/03/19 0155 03/03/19 0400 03/03/19 0600 03/03/19 0855  BP: (!) 151/78 (!) 147/79 (!) 150/82   Pulse: (!) 44 (!) 43 (!) 42 (!) 43  Resp:    18  Temp:      TempSrc:      SpO2: 97%   98%  Weight:      Height:        Intake/Output Summary (Last 24 hours) at 03/03/2019 1157 Last data filed at 03/03/2019 0052 Gross per 24 hour  Intake --  Output 350 ml  Net -350 ml   Filed Weights   03/02/19 0222 03/02/19 2252  Weight: 79.4 kg 79.9 kg    Examination:  General exam: Appears calm and comfortable , NAD Respiratory system: Clear to auscultation. Respiratory effort normal. Cardiovascular system: S1 & S2 heard, RRR. No JVD, murmurs, rubs, gallops or clicks.  Gastrointestinal system: Abdomen is nondistended, soft and nontender. No organomegaly or masses felt. Normal bowel sounds heard. Central nervous system: Alert and oriented.Nystagmus. RUE 4/5, LUE 5/5, RLE 4/5 LLE 5/5. No facial drop. Extremities:  Mild b/l edema, no cyanosis Skin: warm, dry Psychiatry: Judgement and insight appear normal. Mood & affect appropriate.     Data Reviewed: I have personally reviewed following labs and imaging studies  CBC: Recent Labs  Lab 03/02/19 0311  WBC 9.2  NEUTROABS 7.1  HGB 14.4  HCT 40.5  MCV 86.7  PLT 696   Basic Metabolic Panel: Recent Labs  Lab 02/24/19 1522 03/02/19 0311  NA 137 136  K 4.3 4.0  CL 102 103  CO2 20 24  GLUCOSE 222* 274*  BUN 16 15  CREATININE 1.38* 1.20  CALCIUM 9.6 9.1   GFR: Estimated Creatinine Clearance: 65.6 mL/min (by C-G formula based on SCr of 1.2 mg/dL). Liver Function Tests: Recent Labs  Lab 02/24/19 1522 03/02/19 0311  AST 25 31  ALT 36 33  ALKPHOS 139* 120  BILITOT 0.5 0.5  PROT 6.9 6.7  ALBUMIN 4.4 4.1   No results for input(s): LIPASE, AMYLASE in the last 168 hours. No results for input(s): AMMONIA in the last 168 hours. Coagulation Profile: Recent Labs  Lab 03/02/19 0311  INR 1.0   Cardiac Enzymes: No results for input(s): CKTOTAL, CKMB, CKMBINDEX, TROPONINI in the last 168 hours. BNP (last 3 results) No results for input(s): PROBNP in the last 8760 hours. HbA1C: Recent Labs    03/02/19 0311 03/03/19 0447  HGBA1C 8.2* 8.6*   CBG: Recent Labs  Lab 03/02/19 1230 03/02/19 1726 03/03/19 0146 03/03/19 0835  GLUCAP 204* 156* 175* 166*   Lipid Profile: Recent Labs    03/03/19 0447  CHOL 159  HDL 30*  LDLCALC 85  TRIG 222*  CHOLHDL 5.3   Thyroid Function Tests: No results for input(s): TSH, T4TOTAL, FREET4, T3FREE, THYROIDAB in the last 72 hours. Anemia Panel: No results for input(s): VITAMINB12, FOLATE, FERRITIN, TIBC, IRON, RETICCTPCT in the last 72 hours. Sepsis Labs: No results for input(s): PROCALCITON, LATICACIDVEN in the last 168 hours.  Recent Results (from the past 240 hour(s))  SARS CORONAVIRUS 2 (TAT 6-24 HRS) Nasopharyngeal Nasopharyngeal Swab     Status: None   Collection Time: 03/02/19  1:54  PM   Specimen: Nasopharyngeal Swab  Result Value Ref Range Status   SARS Coronavirus 2 NEGATIVE NEGATIVE Final    Comment: (NOTE) SARS-CoV-2 target nucleic acids are NOT DETECTED. The SARS-CoV-2 RNA is generally detectable in upper and lower respiratory specimens during the acute phase of infection. Negative results do not preclude SARS-CoV-2 infection, do not rule out co-infections with other pathogens, and should not be used as the sole basis for treatment or other patient management decisions. Negative results must be combined with clinical observations, patient history, and epidemiological information. The expected result is Negative. Fact Sheet for Patients: SugarRoll.be Fact Sheet for Healthcare Providers: https://www.woods-mathews.com/ This test is not yet approved or cleared by the [place] FDA and  has been authorized for detection and/or diagnosis of SARS-CoV-2 by FDA under an Emergency Use Authorization (EUA). This EUA will remain  in effect (meaning this test can be used) for the duration of the COVID-19 declaration under Section 56 4(b)(1) of the Act, 21 U.S.C. section 360bbb-3(b)(1), unless the authorization is terminated or revoked sooner. Performed at [facility], [place] [address] [place], [place] [address]          Radiology Studies: CT ANGIO HEAD W OR WO CONTRAST  Result Date: 03/02/2019 CLINICAL DATA:  Acute infarct right medulla EXAM: CT ANGIOGRAPHY HEAD AND NECK TECHNIQUE: Multidetector CT imaging of the head and neck was performed using the standard protocol during bolus administration of intravenous contrast. Multiplanar CT image reconstructions and MIPs were obtained to evaluate the vascular anatomy. Carotid stenosis measurements (when applicable) are obtained utilizing NASCET criteria, using the distal internal carotid diameter as the denominator. CONTRAST:  72m OMNIPAQUE IOHEXOL 350 MG/ML SOLN  COMPARISON:  MRI head and CT head 03/02/2019 FINDINGS: CT HEAD FINDINGS Brain: No evidence of acute infarction, hemorrhage, hydrocephalus, extra-axial collection or mass lesion/mass effect. Acute infarct right medulla not identified by CT. Vascular: Negative for hyperdense vessel Skull: Negative Sinuses: Negative Orbits: Negative Review of the MIP images confirms the above findings CTA NECK FINDINGS Aortic arch: Standard branching. Imaged portion shows no evidence of aneurysm or dissection. No significant stenosis of the major arch vessel origins. Right carotid system: Mild atherosclerotic disease right carotid bulb without significant right carotid stenosis Left carotid system: Atherosclerotic disease proximal left internal carotid artery narrowing the lumen to 2.8 mm corresponding to approximately 20% diameter stenosis. Vertebral arteries: Left vertebral dominant. Left vertebral artery widely patent to the basilar Small right vertebral artery which ends in PICA. No prior studies available for comparison. Given the right medullary infarct, is not possible acute occlusion distal right vertebral artery or a congenital variation. Skeleton: No acute skeletal abnormality. Other neck: Negative Upper chest: Negative Review of the MIP images confirms the above findings CTA HEAD FINDINGS Anterior circulation: Cavernous carotid  widely patent bilaterally without stenosis. Anterior and middle cerebral arteries patent bilaterally without stenosis. Posterior circulation: Left vertebral arteries widely patent to the basilar. Left PICA not visualized. Left AICA is patent. Distal right vertebral artery ends in PICA. This is small vessel in this could be congenital variation however there is an acute right PICA infarct. The basilar is patent. Superior cerebellar and posterior cerebral arteries are patent bilaterally posterior communicating arteries are patent bilaterally. Venous sinuses: Patent Anatomic variants: None Review of the  MIP images confirms the above findings IMPRESSION: 1. Acute infarct right medulla not identified by CT. 2. Small right vertebral artery which ends in PICA. This could be congenital variation however acute occlusion distal right vertebral artery is a consideration given acute right PICA infarct. 3. 20% diameter stenosis proximal left internal carotid artery. No significant right carotid stenosis. Electronically Signed   By: Franchot Gallo M.D.   On: 03/02/2019 13:54   CT HEAD WO CONTRAST  Result Date: 03/02/2019 CLINICAL DATA:  Ataxia, stroke suspected Possible stroke Headache, slowing, right-sided weakness. EXAM: CT HEAD WITHOUT CONTRAST TECHNIQUE: Contiguous axial images were obtained from the base of the skull through the vertex without intravenous contrast. COMPARISON:  None. FINDINGS: Brain: No intracranial hemorrhage, mass effect, or midline shift. No hydrocephalus. The basilar cisterns are patent. No evidence of territorial infarct or acute ischemia. No extra-axial or intracranial fluid collection. Vascular: No hyperdense vessel or unexpected calcification. Skull: Normal. Negative for fracture or focal lesion. Sinuses/Orbits: Paranasal sinuses and mastoid air cells are clear. The visualized orbits are unremarkable. Other: None. IMPRESSION: Unremarkable noncontrast head CT. Electronically Signed   By: Keith Rake M.D.   On: 03/02/2019 02:45   CT ANGIO NECK W OR WO CONTRAST  Result Date: 03/02/2019 CLINICAL DATA:  Acute infarct right medulla EXAM: CT ANGIOGRAPHY HEAD AND NECK TECHNIQUE: Multidetector CT imaging of the head and neck was performed using the standard protocol during bolus administration of intravenous contrast. Multiplanar CT image reconstructions and MIPs were obtained to evaluate the vascular anatomy. Carotid stenosis measurements (when applicable) are obtained utilizing NASCET criteria, using the distal internal carotid diameter as the denominator. CONTRAST:  34m OMNIPAQUE  IOHEXOL 350 MG/ML SOLN COMPARISON:  MRI head and CT head 03/02/2019 FINDINGS: CT HEAD FINDINGS Brain: No evidence of acute infarction, hemorrhage, hydrocephalus, extra-axial collection or mass lesion/mass effect. Acute infarct right medulla not identified by CT. Vascular: Negative for hyperdense vessel Skull: Negative Sinuses: Negative Orbits: Negative Review of the MIP images confirms the above findings CTA NECK FINDINGS Aortic arch: Standard branching. Imaged portion shows no evidence of aneurysm or dissection. No significant stenosis of the major arch vessel origins. Right carotid system: Mild atherosclerotic disease right carotid bulb without significant right carotid stenosis Left carotid system: Atherosclerotic disease proximal left internal carotid artery narrowing the lumen to 2.8 mm corresponding to approximately 20% diameter stenosis. Vertebral arteries: Left vertebral dominant. Left vertebral artery widely patent to the basilar Small right vertebral artery which ends in PICA. No prior studies available for comparison. Given the right medullary infarct, is not possible acute occlusion distal right vertebral artery or a congenital variation. Skeleton: No acute skeletal abnormality. Other neck: Negative Upper chest: Negative Review of the MIP images confirms the above findings CTA HEAD FINDINGS Anterior circulation: Cavernous carotid widely patent bilaterally without stenosis. Anterior and middle cerebral arteries patent bilaterally without stenosis. Posterior circulation: Left vertebral arteries widely patent to the basilar. Left PICA not visualized. Left AICA is patent. Distal right vertebral artery ends  in PICA. This is small vessel in this could be congenital variation however there is an acute right PICA infarct. The basilar is patent. Superior cerebellar and posterior cerebral arteries are patent bilaterally posterior communicating arteries are patent bilaterally. Venous sinuses: Patent Anatomic  variants: None Review of the MIP images confirms the above findings IMPRESSION: 1. Acute infarct right medulla not identified by CT. 2. Small right vertebral artery which ends in PICA. This could be congenital variation however acute occlusion distal right vertebral artery is a consideration given acute right PICA infarct. 3. 20% diameter stenosis proximal left internal carotid artery. No significant right carotid stenosis. Electronically Signed   By: Franchot Gallo M.D.   On: 03/02/2019 13:54   MR BRAIN WO CONTRAST  Result Date: 03/02/2019 CLINICAL DATA:  Acute headache with normal neuro exam. EXAM: MRI HEAD WITHOUT CONTRAST TECHNIQUE: Multiplanar, multiecho pulse sequences of the brain and surrounding structures were obtained without intravenous contrast. COMPARISON:  Head CT from earlier today FINDINGS: Brain: Restricted diffusion in the lateral right medulla. No pre-existing infarct. No acute hemorrhage, hydrocephalus, or masslike finding. Two or 3 remote white matter insults that are tiny and nonspecific. Brain volume is normal. Vascular: Preserved flow voids. The left vertebral artery is dominant. The right vertebral artery is difficult to assess given small size, but appears to have a preserved flow void at the level of the V3 segment on axial T2 weighted imaging. Skull and upper cervical spine: Normal marrow signal Sinuses/Orbits: Gaze preference to the right. Other: Progressively motion degraded IMPRESSION: Acute right lateral medullary infarct. Electronically Signed   By: Monte Fantasia M.D.   On: 03/02/2019 09:37   US Carotid Bilateral (at [facility] and [facility] only)  Result Date: 03/02/2019 CLINICAL DATA:  CVA. EXAM: BILATERAL CAROTID DUPLEX ULTRASOUND TECHNIQUE: Pearline Cables scale imaging, color Doppler and duplex ultrasound were performed of bilateral carotid and vertebral arteries in the neck. COMPARISON:  None. FINDINGS: Criteria: Quantification of carotid stenosis is based on velocity parameters that  correlate the residual internal carotid diameter with NASCET-based stenosis levels, using the diameter of the distal internal carotid lumen as the denominator for stenosis measurement. The following velocity measurements were obtained: RIGHT ICA: 80/19 cm/sec CCA: 46/27 cm/sec SYSTOLIC ICA/CCA RATIO:  0.9 ECA: 139 cm/sec LEFT ICA: 70/14 cm/sec CCA: 03/50 cm/sec SYSTOLIC ICA/CCA RATIO:  0.8 ECA: 60 cm/sec RIGHT CAROTID ARTERY: Small amount of heterogeneous plaque at the right carotid bulb. External carotid artery is patent with normal waveform. Small amount of plaque in the proximal internal carotid artery. Normal waveforms and velocities in the internal carotid artery. RIGHT VERTEBRAL ARTERY: Antegrade flow and normal waveform in the right vertebral artery. LEFT CAROTID ARTERY: Small amount of plaque at the left carotid bulb. External carotid artery is patent with normal waveform. Small amount of plaque in the proximal internal carotid artery. Normal waveforms and velocities in the internal carotid artery. LEFT VERTEBRAL ARTERY: Antegrade flow and normal waveform in the left vertebral artery. IMPRESSION: 1. Mild atherosclerotic disease in the bilateral carotid arteries. Estimated degree of stenosis in the internal carotid arteries is less than 50% bilaterally. 2. Patent vertebral arteries with antegrade flow. Electronically Signed   By: Markus Daft M.D.   On: 03/02/2019 14:18        Scheduled Meds: . aspirin EC  81 mg Oral Daily  . budesonide (PULMICORT) nebulizer solution  0.25 mg Nebulization BID  . clopidogrel  75 mg Oral Daily  . enoxaparin (LOVENOX) injection  40 mg Subcutaneous Q24H  .  fenofibrate  54 mg Oral Daily  . gabapentin  300 mg Oral BID  . insulin aspart  0-9 Units Subcutaneous TID WC  . pantoprazole  40 mg Oral Daily  . simvastatin  40 mg Oral Daily  . 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  No significant stenosis (please see full report Echo pending Continue stroke protocol, frequent neurochecks Telemetry Neurology following-recommend Dual antiplatelet therapy with ASA 54m and Plavix 736mfor three weeks with change toASA 812maily LDL target less than 70 PT/OT-OT recommends SNF Speech swallow for swallowing eval   Type 2 diabetes mellitus -Placed on sliding scale insulin H A1c on 03/03/2019 was 8.6  Hyperlipidemia LDL 85 , triglyceride 222 Continue with fenofibrate and simvastatin aggressive therapy   Essential hypertension -allow permissive hypertension, keeping around 160 today, with slowly improving .    DVT prophylaxis: Lovenox  Code Status: Full code  Family Communication: none at bedside Disposition Plan: Likely will need SNF.  Will be here 1-2 more days.  PT pending       LOS: 1 day   Time spent: 45 minutes with more than 50% on COCDestrehanD [facility] Pager [phone]  If 7PM-7AM, please contact night-coverage [email or web address] Password [email or web address] 03/03/2019, 11:57 AM

## 2019-03-03 NOTE — Progress Notes (Addendum)
CBG of 175, taken by [place], NT, failed to transfer over to results

## 2019-03-03 NOTE — Evaluation (Addendum)
Clinical/Bedside Swallow Evaluation Patient Details  Name: [name]. MRN: [id number] Date of Birth: May 28, 1960  Today's Date: 03/03/2019 Time: SLP Start Time (ACUTE ONLY): 11 SLP Stop Time (ACUTE ONLY): 1020 SLP Time Calculation (min) (ACUTE ONLY): 60 min  Past Medical History:  Past Medical History:  Diagnosis Date  . Arrhythmia   . Asthma   . Cyst of kidney, acquired   . Diabetes mellitus without complication ([place]) 1191   type 2  . GERD (gastroesophageal reflux disease)   . History of chicken pox   . History of measles as a child   . History of PKU   . Hyperlipidemia   . Hypertension   . IBS (irritable bowel syndrome)   . Irregular heart beat   . Mentally challenged   . Pancreatitis    Past Surgical History:  Past Surgical History:  Procedure Laterality Date  . Cardiac Catherization     [facility]  . CARDIAC CATHETERIZATION     [facility]  . COLONOSCOPY    . ESOPHAGOGASTRODUODENOSCOPY (EGD) WITH PROPOFOL N/A 12/27/2016   Procedure: ESOPHAGOGASTRODUODENOSCOPY (EGD) WITH PROPOFOL;  Surgeon: Lin Landsman, MD;  Location: [place];  Service: Gastroenterology;  Laterality: N/A;  . HEMORRHOID SURGERY    . Ligament Removal Left    of left thumb: dr. Cleda Mccreedy  . ligament removal  of left thumb     Dr. Cleda Mccreedy  . NM GATED MYOCARDIAL STUDY (ARMX HX)  06/23/2014   [place]. Normal   HPI:  Pt is a 58 y.o. male with medical history significant of type 2 diabetes mellitus, hypertension, hyperlipidemia, Mentally Challenged, phenylketonuria, presents to the hospital with chief complaint of headache and weakness since yesterday.  Patient reports that had a fall a few days ago and has been having headaches since.  He also reports dizziness.  He also feels weak mainly on the right side and tends to lean towards the right when he is walking.  He denies any chest pain, he denies any shortness of breath, denies any fever or chills.  He denies any numbness or tingling.  He denies  any visual changes however tends to keep his eyes closed during my interaction and tells me is because of his cataracts.  He c/o being "dizzy".  He is overall a poor historian.  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   Assessment / Plan / Recommendation Clinical Impression  Pt appears to exhibit adequate oropharyngeal phase swallowing function w/ No overt, clinical s/s of aspiration noted during po trials at bedside. Pt was concerned about his "dizziness making me feel funny" and "wanted to ease into them" referring to solid foods "later on at lunch" -- he ordered Fish for his meal. Will f/u w/ NSG for any potential deficits if noted. Pt is Edentulous baseline and was educated on choosing foods cooked/moist and easy to gum/mash; pt agreed. Pt also has a dx of Mentally Challenged per chart notes; he required few verbal cues intermittently to redirect attention to tasks(unsure of pt's baseline Cognitive-linguistic skills -- recommend f/u at Discharge w/ Outpatient services if any decline in function from baseline is noted by pt/family as pt has had lateral medullary infarct - not typical for language deficits). Pt consumed trials of thin liquids via cup/straw w/ instructions to take smaller, single sips more slowly, purees w/ no overt s/s of aspiration noted; no decline in vocal quality or respiratory status noted during/post trials. Oral phase was [facility] for bolus management of the trials consumed; timely A-P  transfer and oral clearing w/ consistencies. OM exam WFL w/ no unilateral weakness noted -- all lingual movements were timely in coordination; strength+. Pt fed self w/ setup support. Recommend a more mech soft consistency diet d/t Edentulous status baseline; thin liquids. Recommend general aspiration precautions; Pills w/ puree if any difficulty noted w/ liquids. Tray setup at meals, positioning. Of note, pt exhibited Hiccups as the evaluation initiated.  Addendum: pt has a baseline dx of  Mentally Challenged and presents w/ concrete Cognitive-linguistic communication w/ others w/ tangential engagement. In discussing w/ Neurology, this would be suspected to be his baseline and not an impact of the type of stroke pt had.  SLP Visit Diagnosis: Dysphagia, unspecified (R13.10)(pt is Edentulous at baseline)   Addendum at [facility] (w/ lunch meal): pt seen during his lunch meal today for f/u w/ toleration of solids in his diet. After setup and reducing distractions in the room/surrounding the meal, pt fed self several ozs of fish, potatoes, and tea w/ No overt, clinical s/s of aspiration or oral phase deficits. Pt was easily distracted (almost tangential in his conversation) and required verbal cues to attend to task of meal. Such a presentation would not be expected of his type of current stroke - lateral medullary infarct. Again, recommend f/u w/ PCP and family re: pt's baseline Cognitive-linguistic presentation in light of his baseline mental challenges. No further skilled ST services indicated for dysphagia at this time. NSG to reconsult if any decline in swallowing status while admitted. NSG updated/agreed. Aspiration precautions were left w/ pt and posted in room. Recommend Pills Whole in Puree if any difficulty swallowing w/ liquids.       Aspiration Risk  (reduced following general precs)    Diet Recommendation  Mech Soft diet (softer foods for easier gumming/mashing d/t Edentulous status baseline); Thin liquids. General aspiration precautions; positioning and tray setup  Medication Administration: Whole meds with liquid(or Whole in Puree if indicated)    Other  Recommendations Recommended Consults: (Dietician f/u) Oral Care Recommendations: Oral care BID;Patient independent with oral care(support) Other Recommendations: (n/a)   Follow up Recommendations None(TBD)      Frequency and Duration min 2x/week  1 week       Prognosis Prognosis for Safe Diet Advancement: Good Barriers  to Reach Goals: Cognitive deficits(baseline)      Swallow Study   General Date of Onset: 03/02/19 HPI: Pt is a 58 y.o. male with medical history significant of type 2 diabetes mellitus, hypertension, hyperlipidemia, Mentally Challenged, phenylketonuria, presents to the hospital with chief complaint of headache and weakness since yesterday.  Patient reports that had a fall a few days ago and has been having headaches since.  He also reports dizziness.  He also feels weak mainly on the right side and tends to lean towards the right when he is walking.  He denies any chest pain, he denies any shortness of breath, denies any fever or chills.  He denies any numbness or tingling.  He denies any visual changes however tends to keep his eyes closed during my interaction and tells me is because of his cataracts.  He c/o being "dizzy".  He is overall a poor historian.  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 Type of Study: Bedside Swallow Evaluation Previous Swallow Assessment: none  Diet Prior to this Study: Regular;Thin liquids Temperature Spikes Noted: No(wbc 9.2) Respiratory Status: Room air History of Recent Intubation: No Behavior/Cognition: Alert;Cooperative;Pleasant mood;Distractible;Requires cueing Oral Cavity Assessment: Within Functional Limits Oral Care Completed by SLP:  Yes Oral Cavity - Dentition: Edentulous Vision: Functional for self-feeding Self-Feeding Abilities: Able to feed self;Needs set up(d/t vision deficits) Patient Positioning: Upright in bed(needed min support) Baseline Vocal Quality: Normal Volitional Cough: Strong Volitional Swallow: Able to elicit    Oral/Motor/Sensory Function Overall Oral Motor/Sensory Function: Within functional limits(slight R facial weakness)   Ice Chips Ice chips: Not tested   Thin Liquid Thin Liquid: Within functional limits Presentation: Cup;Self Fed;Straw(~3-4 ozs total) Other Comments: pt followed instructions to use small sips,  sit fully upright to drink; pt stated the drink "spilled out of my mouth earlier" but this was NOT seen during trials at bedside    Nectar Thick Nectar Thick Liquid: Not tested   Honey Thick Honey Thick Liquid: Not tested   Puree Puree: Within functional limits Presentation: Self Fed;Spoon(6 trials)   Solid     Solid: Not tested Other Comments: pt wanted to "ease into solid foods later on at lunch" -- he ordered Fish. Pt is edentulous.        Orinda Kenner, MS, CCC-SLP Vickye Astorino 03/03/2019,12:03 PM

## 2019-03-03 NOTE — Progress Notes (Signed)
Subjective: No new neurological complaints.  Objective: Current vital signs: BP (!) 150/82   Pulse (!) 43   Temp 97.6 F (36.4 C) (Oral)   Resp 18   Ht 5' 4" (1.626 m)   Wt 79.9 kg   SpO2 98%   BMI 30.24 kg/m  Vital signs in last 24 hours: Temp:  [97.6 F (36.4 C)] 97.6 F (36.4 C) (12/14 2252) Pulse Rate:  [42-54] 43 (12/15 0855) Resp:  [13-20] 18 (12/15 0855) BP: (136-173)/(69-89) 150/82 (12/15 0600) SpO2:  [97 %-100 %] 98 % (12/15 0855) Weight:  [79.9 kg] 79.9 kg (12/14 2252)  Intake/Output from previous day: 12/14 0701 - 12/15 0700 In: -  Out: 350 [Urine:350] Intake/Output this shift: No intake/output data recorded. Nutritional status:  Diet Order            Diet Carb Modified Fluid consistency: Thin; Room service appropriate? Yes  Diet effective now              Neurologic Exam: Mental Status: Alert and awake.  Thought content appropriate.  Speech fluent without evidence of aphasia.  Able to follow 3 step commands without difficulty. Cranial Nerves: II: Discs flat bilaterally; Visual fields grossly normal, pupils equal, round, reactive to light and accommodation III,IV, VI: no ptosis, extra-ocular motions intact bilaterally with extensive nystagmus in all directions V,VII: right facial droop, facial light touch sensation decreased on the right VIII: hearing normal bilaterally IX,X: gag reflex reduced XI: bilateral shoulder shrug XII: midline tongue extension Motor: Patient provides full strength on the left upper and lower extremities.  Unable to provide resistance on the right upper and lower extremities.  External rotation of the RLE Sensory: Pinprick and light touch decreased on the right   Lab Results: Basic Metabolic Panel: Recent Labs  Lab 02/24/19 1522 03/02/19 0311  NA 137 136  K 4.3 4.0  CL 102 103  CO2 20 24  GLUCOSE 222* 274*  BUN 16 15  CREATININE 1.38* 1.20  CALCIUM 9.6 9.1    Liver Function Tests: Recent Labs  Lab  02/24/19 1522 03/02/19 0311  AST 25 31  ALT 36 33  ALKPHOS 139* 120  BILITOT 0.5 0.5  PROT 6.9 6.7  ALBUMIN 4.4 4.1   No results for input(s): LIPASE, AMYLASE in the last 168 hours. No results for input(s): AMMONIA in the last 168 hours.  CBC: Recent Labs  Lab 03/02/19 0311  WBC 9.2  NEUTROABS 7.1  HGB 14.4  HCT 40.5  MCV 86.7  PLT 165    Cardiac Enzymes: No results for input(s): CKTOTAL, CKMB, CKMBINDEX, TROPONINI in the last 168 hours.  Lipid Panel: Recent Labs  Lab 03/03/19 0447  CHOL 159  TRIG 222*  HDL 30*  CHOLHDL 5.3  VLDL 44*  LDLCALC 85    CBG: Recent Labs  Lab 03/02/19 1230 03/02/19 1726 03/03/19 0146 03/03/19 0835  GLUCAP 204* 156* 175* 166*    Microbiology: Results for orders placed or performed during the hospital encounter of 03/02/19  SARS CORONAVIRUS 2 (TAT 6-24 HRS) Nasopharyngeal Nasopharyngeal Swab     Status: None   Collection Time: 03/02/19  1:54 PM   Specimen: Nasopharyngeal Swab  Result Value Ref Range Status   SARS Coronavirus 2 NEGATIVE NEGATIVE Final    Comment: (NOTE) SARS-CoV-2 target nucleic acids are NOT DETECTED. The SARS-CoV-2 RNA is generally detectable in upper and lower respiratory specimens during the acute phase of infection. Negative results do not preclude SARS-CoV-2 infection, do not rule out co-infections  with other pathogens, and should not be used as the sole basis for treatment or other patient management decisions. Negative results must be combined with clinical observations, patient history, and epidemiological information. The expected result is Negative. Fact Sheet for Patients: SugarRoll.be Fact Sheet for Healthcare Providers: https://www.woods-mathews.com/ This test is not yet approved or cleared by the [place] FDA and  has been authorized for detection and/or diagnosis of SARS-CoV-2 by FDA under an Emergency Use Authorization (EUA). This EUA will  remain  in effect (meaning this test can be used) for the duration of the COVID-19 declaration under Section 56 4(b)(1) of the Act, 21 U.S.C. section 360bbb-3(b)(1), unless the authorization is terminated or revoked sooner. Performed at [facility], [place] [address], [place], [place] [address]     Coagulation Studies: Recent Labs    03/02/19 0311  LABPROT 13.2  INR 1.0    Imaging: CT ANGIO HEAD W OR WO CONTRAST  Result Date: 03/02/2019 CLINICAL DATA:  Acute infarct right medulla EXAM: CT ANGIOGRAPHY HEAD AND NECK TECHNIQUE: Multidetector CT imaging of the head and neck was performed using the standard protocol during bolus administration of intravenous contrast. Multiplanar CT image reconstructions and MIPs were obtained to evaluate the vascular anatomy. Carotid stenosis measurements (when applicable) are obtained utilizing NASCET criteria, using the distal internal carotid diameter as the denominator. CONTRAST:  62m OMNIPAQUE IOHEXOL 350 MG/ML SOLN COMPARISON:  MRI head and CT head 03/02/2019 FINDINGS: CT HEAD FINDINGS Brain: No evidence of acute infarction, hemorrhage, hydrocephalus, extra-axial collection or mass lesion/mass effect. Acute infarct right medulla not identified by CT. Vascular: Negative for hyperdense vessel Skull: Negative Sinuses: Negative Orbits: Negative Review of the MIP images confirms the above findings CTA NECK FINDINGS Aortic arch: Standard branching. Imaged portion shows no evidence of aneurysm or dissection. No significant stenosis of the major arch vessel origins. Right carotid system: Mild atherosclerotic disease right carotid bulb without significant right carotid stenosis Left carotid system: Atherosclerotic disease proximal left internal carotid artery narrowing the lumen to 2.8 mm corresponding to approximately 20% diameter stenosis. Vertebral arteries: Left vertebral dominant. Left vertebral artery widely patent to the basilar Small right vertebral  artery which ends in PICA. No prior studies available for comparison. Given the right medullary infarct, is not possible acute occlusion distal right vertebral artery or a congenital variation. Skeleton: No acute skeletal abnormality. Other neck: Negative Upper chest: Negative Review of the MIP images confirms the above findings CTA HEAD FINDINGS Anterior circulation: Cavernous carotid widely patent bilaterally without stenosis. Anterior and middle cerebral arteries patent bilaterally without stenosis. Posterior circulation: Left vertebral arteries widely patent to the basilar. Left PICA not visualized. Left AICA is patent. Distal right vertebral artery ends in PICA. This is small vessel in this could be congenital variation however there is an acute right PICA infarct. The basilar is patent. Superior cerebellar and posterior cerebral arteries are patent bilaterally posterior communicating arteries are patent bilaterally. Venous sinuses: Patent Anatomic variants: None Review of the MIP images confirms the above findings IMPRESSION: 1. Acute infarct right medulla not identified by CT. 2. Small right vertebral artery which ends in PICA. This could be congenital variation however acute occlusion distal right vertebral artery is a consideration given acute right PICA infarct. 3. 20% diameter stenosis proximal left internal carotid artery. No significant right carotid stenosis. Electronically Signed   By: CFranchot GalloM.D.   On: 03/02/2019 13:54   CT HEAD WO CONTRAST  Result Date: 03/02/2019 CLINICAL DATA:  Ataxia, stroke suspected Possible stroke Headache, slowing, right-sided weakness. EXAM: CT HEAD WITHOUT CONTRAST TECHNIQUE: Contiguous axial images were obtained from the base of the skull through the vertex without intravenous contrast. COMPARISON:  None. FINDINGS: Brain: No intracranial hemorrhage, mass effect, or midline shift. No hydrocephalus. The basilar cisterns are patent. No evidence of territorial  infarct or acute ischemia. No extra-axial or intracranial fluid collection. Vascular: No hyperdense vessel or unexpected calcification. Skull: Normal. Negative for fracture or focal lesion. Sinuses/Orbits: Paranasal sinuses and mastoid air cells are clear. The visualized orbits are unremarkable. Other: None. IMPRESSION: Unremarkable noncontrast head CT. Electronically Signed   By: Keith Rake M.D.   On: 03/02/2019 02:45   CT ANGIO NECK W OR WO CONTRAST  Result Date: 03/02/2019 CLINICAL DATA:  Acute infarct right medulla EXAM: CT ANGIOGRAPHY HEAD AND NECK TECHNIQUE: Multidetector CT imaging of the head and neck was performed using the standard protocol during bolus administration of intravenous contrast. Multiplanar CT image reconstructions and MIPs were obtained to evaluate the vascular anatomy. Carotid stenosis measurements (when applicable) are obtained utilizing NASCET criteria, using the distal internal carotid diameter as the denominator. CONTRAST:  48m OMNIPAQUE IOHEXOL 350 MG/ML SOLN COMPARISON:  MRI head and CT head 03/02/2019 FINDINGS: CT HEAD FINDINGS Brain: No evidence of acute infarction, hemorrhage, hydrocephalus, extra-axial collection or mass lesion/mass effect. Acute infarct right medulla not identified by CT. Vascular: Negative for hyperdense vessel Skull: Negative Sinuses: Negative Orbits: Negative Review of the MIP images confirms the above findings CTA NECK FINDINGS Aortic arch: Standard branching. Imaged portion shows no evidence of aneurysm or dissection. No significant stenosis of the major arch vessel origins. Right carotid system: Mild atherosclerotic disease right carotid bulb without significant right carotid stenosis Left carotid system: Atherosclerotic disease proximal left internal carotid artery narrowing the lumen to 2.8 mm corresponding to approximately 20% diameter stenosis. Vertebral arteries: Left vertebral dominant. Left vertebral artery widely patent to the basilar  Small right vertebral artery which ends in PICA. No prior studies available for comparison. Given the right medullary infarct, is not possible acute occlusion distal right vertebral artery or a congenital variation. Skeleton: No acute skeletal abnormality. Other neck: Negative Upper chest: Negative Review of the MIP images confirms the above findings CTA HEAD FINDINGS Anterior circulation: Cavernous carotid widely patent bilaterally without stenosis. Anterior and middle cerebral arteries patent bilaterally without stenosis. Posterior circulation: Left vertebral arteries widely patent to the basilar. Left PICA not visualized. Left AICA is patent. Distal right vertebral artery ends in PICA. This is small vessel in this could be congenital variation however there is an acute right PICA infarct. The basilar is patent. Superior cerebellar and posterior cerebral arteries are patent bilaterally posterior communicating arteries are patent bilaterally. Venous sinuses: Patent Anatomic variants: None Review of the MIP images confirms the above findings IMPRESSION: 1. Acute infarct right medulla not identified by CT. 2. Small right vertebral artery which ends in PICA. This could be congenital variation however acute occlusion distal right vertebral artery is a consideration given acute right PICA infarct. 3. 20% diameter stenosis proximal left internal carotid artery. No significant right carotid stenosis. Electronically Signed   By: CFranchot GalloM.D.   On: 03/02/2019 13:54   MR BRAIN WO CONTRAST  Result Date: 03/02/2019 CLINICAL DATA:  Acute headache with normal neuro exam. EXAM: MRI HEAD WITHOUT CONTRAST TECHNIQUE: Multiplanar, multiecho pulse sequences of the brain and surrounding structures were obtained without intravenous contrast. COMPARISON:  Head CT from earlier today FINDINGS: Brain: Restricted  diffusion in the lateral right medulla. No pre-existing infarct. No acute hemorrhage, hydrocephalus, or masslike  finding. Two or 3 remote white matter insults that are tiny and nonspecific. Brain volume is normal. Vascular: Preserved flow voids. The left vertebral artery is dominant. The right vertebral artery is difficult to assess given small size, but appears to have a preserved flow void at the level of the V3 segment on axial T2 weighted imaging. Skull and upper cervical spine: Normal marrow signal Sinuses/Orbits: Gaze preference to the right. Other: Progressively motion degraded IMPRESSION: Acute right lateral medullary infarct. Electronically Signed   By: Monte Fantasia M.D.   On: 03/02/2019 09:37   US Carotid Bilateral (at [facility] and [facility] only)  Result Date: 03/02/2019 CLINICAL DATA:  CVA. EXAM: BILATERAL CAROTID DUPLEX ULTRASOUND TECHNIQUE: Pearline Cables scale imaging, color Doppler and duplex ultrasound were performed of bilateral carotid and vertebral arteries in the neck. COMPARISON:  None. FINDINGS: Criteria: Quantification of carotid stenosis is based on velocity parameters that correlate the residual internal carotid diameter with NASCET-based stenosis levels, using the diameter of the distal internal carotid lumen as the denominator for stenosis measurement. The following velocity measurements were obtained: RIGHT ICA: 80/19 cm/sec CCA: 27/06 cm/sec SYSTOLIC ICA/CCA RATIO:  0.9 ECA: 139 cm/sec LEFT ICA: 70/14 cm/sec CCA: 23/76 cm/sec SYSTOLIC ICA/CCA RATIO:  0.8 ECA: 60 cm/sec RIGHT CAROTID ARTERY: Small amount of heterogeneous plaque at the right carotid bulb. External carotid artery is patent with normal waveform. Small amount of plaque in the proximal internal carotid artery. Normal waveforms and velocities in the internal carotid artery. RIGHT VERTEBRAL ARTERY: Antegrade flow and normal waveform in the right vertebral artery. LEFT CAROTID ARTERY: Small amount of plaque at the left carotid bulb. External carotid artery is patent with normal waveform. Small amount of plaque in the proximal internal carotid artery.  Normal waveforms and velocities in the internal carotid artery. LEFT VERTEBRAL ARTERY: Antegrade flow and normal waveform in the left vertebral artery. IMPRESSION: 1. Mild atherosclerotic disease in the bilateral carotid arteries. Estimated degree of stenosis in the internal carotid arteries is less than 50% bilaterally. 2. Patent vertebral arteries with antegrade flow. Electronically Signed   By: Markus Daft M.D.   On: 03/02/2019 14:18    Medications:  I have reviewed the patient's current medications. Scheduled: . aspirin EC  81 mg Oral Daily  . budesonide (PULMICORT) nebulizer solution  0.25 mg Nebulization BID  . clopidogrel  75 mg Oral Daily  . enoxaparin (LOVENOX) injection  40 mg Subcutaneous Q24H  . fenofibrate  54 mg Oral Daily  . gabapentin  300 mg Oral BID  . insulin aspart  0-9 Units Subcutaneous TID WC  . pantoprazole  40 mg Oral Daily  . simvastatin  40 mg Oral Daily  . sodium chloride flush  3 mL Intravenous Once    Assessment/Plan: 58 y.o. male with a history of HTN, HLD and DM presenting with headache, dizziness, and right hemiparesis.  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  Likely secondary to small vessel disease.  Patient on no antiplatelet therapy prior to admission.  CT angio reveals small right vertebral artery.  No significant carotid stenosis.  Echocardiogram is pending. A1c 8.6, LDL 85.    Recommendations: 1. PT consult, OT consult, Speech consult 2. Echocardiogram pending 3. Aggressive lipid management with target LDL<70. 4. Prophylactic therapy-Dual antiplatelet therapy with ASA 49m and Plavix 783mfor three weeks with change to ASA 8130maily  5. Telemetry monitoring 6. Frequent neuro checks    LOS: 1 day   Alexis Goodell, MD Neurology [phone] 03/03/2019  11:47 AM

## 2019-03-03 NOTE — Progress Notes (Signed)
Pt states he can not read without his glasses.He states that "everything is blurry"

## 2019-03-03 NOTE — Chronic Care Management (AMB) (Signed)
  Chronic Care Management   Note  03/03/2019 Name: [name]. MRN: [id number] DOB: [birth year]  Alex Metro. is a 58 y.o. year old male who is a primary care patient of Cannady, Barbaraann Faster, NP. The CCM team was consulted for assistance with chronic disease management and care coordination needs.    Received call from patient's spouse, [name], wanting to notify the CCM team and PCP that patient is admitted for a CVA.   Provided encouragement to patient's wife. Will follow patient for discharge (now likely to SNF) and eventual medication reconciliation.   Follow up plan: - Will collaborate w/ CCM team  Catie Darnelle Maffucci, PharmD, [place] [phone]

## 2019-03-03 NOTE — Evaluation (Addendum)
Physical Therapy Evaluation Patient Details Name: [name]. MRN: [id number] DOB: 28-Apr-1960 Today's Date: 03/03/2019   History of Present Illness  58 y.o. male with a history of HTN, HLD and DM who reports being at baseline until coming out of the shower at 0100 this morning.  He was coming out of the shower when he developed sudden severe headache that he described as throbbing, sharp, generalized, 10 out of 10, associated with dizziness.  Patient reports that he felt like his R leg was weaker than the L and he was unable to walk straight without leaning to the right. Has been having headaches over the past 2-3 weeks without associated focal neurological symptoms. 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  Clinical Impression  Pt is a pleasant 58 year old male who was admitted for R medullary infarct. Pt lacks safety awareness, impulsive, and needs cues for attention to task. Difficult to test sensation, unsure of accuracy. Coordination appears intact. Able to name objects correctly. Pt performs bed mobility with min assist and transfers/ambulation with mod assist and assist from therapist. RW use increases fall risk at this time due to poor balance and incorrect use. Also, demonstrates impaired vision, has difficulty keeping L eye open and visible nystagmus in B eyes when open. Pt reports blurry vision but didn't appear to inhibit mobility. Pt demonstrates deficits with strength/balance/mobility. Pt reports he was independent prior to admission. Would benefit from skilled PT to address above deficits and promote optimal return to PLOF. Currently recommending admission to CIR.    Follow Up Recommendations CIR    Equipment Recommendations  (TBD)    Recommendations for Other Services       Precautions / Restrictions Precautions Precautions: Fall Restrictions Weight Bearing Restrictions: No      Mobility  Bed Mobility Overal bed mobility: Needs Assistance Bed  Mobility: Supine to Sit     Supine to sit: Min assist     General bed mobility comments: needs cues for initiation of activity with specific cues on how to perform task. Once seated at EOB, mild R side lateral lean. Pt aware of impairement and able to self correct, however struggles to maintain upright posture without cga.   Transfers Overall transfer level: Needs assistance Equipment used: Rolling walker (2 wheeled) Transfers: Sit to/from Stand Sit to Stand: Mod assist         General transfer comment: attempted standing with RW. Heavy R side lean requiring mod/max assist to correct. Pt very unsteady. 2nd attempt without RW, guarding performed infront of pt with improved ability to weight shift equally. Still required heavy assist to maintain upright posture.  Ambulation/Gait Ambulation/Gait assistance: Mod assist Gait Distance (Feet): 6 Feet Assistive device: 1 person hand held assist Gait Pattern/deviations: Step-to pattern     General Gait Details: Able to stand and perform ambulation towards R to recliner and then to [facility]. When ambulation performed from [facility] to recliner, needed max assist. Pt impulsive and struggles with safety awareness.  Stairs            Wheelchair Mobility    Modified Rankin (Stroke Patients Only)       Balance Overall balance assessment: History of Falls;Needs assistance Sitting-balance support: Feet supported;Bilateral upper extremity supported Sitting balance-Leahy Scale: Fair Sitting balance - Comments: mild R side lean   Standing balance support: Single extremity supported Standing balance-Leahy Scale: Poor Standing balance comment: heavy post lateral R lean  Pertinent Vitals/Pain Pain Assessment: Faces Faces Pain Scale: Hurts little more Pain Location: R abdomen Pain Descriptors / Indicators: Aching Pain Intervention(s): Limited activity within patient's tolerance;Patient requesting pain  meds-RN notified    Home Living Family/patient expects to be discharged to:: Private residence Living Arrangements: Spouse/significant other Available Help at Discharge: Family Type of Home: Mobile home Home Access: Ramped entrance     Home Layout: One level [place]: Hand held shower head(reports his walker is broken)      Prior Function Level of Independence: Independent         Comments: Pt reports indep with ADL, mobility, and is primary caregiver for spouse for ADL, aide assists pt's spouse with meal prep/cleaning; 1 fall just prior to this admission     Hand Dominance        Extremity/Trunk Assessment   Upper Extremity Assessment Upper Extremity Assessment: Overall WFL for tasks assessed    Lower Extremity Assessment Lower Extremity Assessment: Generalized weakness(R LE grossly 4-/5; L LE grossly 4/5)       Communication   Communication: No difficulties  Cognition Arousal/Alertness: Awake/alert Behavior During Therapy: Impulsive Overall Cognitive Status: History of cognitive impairments - at baseline                                 General Comments: alert and oriented, however very tangential in conversation and demonstrates lack of safety awareness      General Comments      Exercises Other Exercises Other Exercises: supine ther-ex performed on B LE including AP, SLRs, hip abd/add, and heel slides. All ther-ex performed with supervision x 10 reps Other Exercises: stood at bedside needing min assist to maintain balance while using urinal. Pt then ambulated to [facility] for BM. +2 assist to stand and perform hygiene   Assessment/Plan    PT Assessment Patient needs continued PT services  PT Problem List Decreased strength;Decreased activity tolerance;Decreased balance;Decreased mobility;Decreased cognition;Decreased knowledge of use of DME;Decreased safety awareness       PT Treatment Interventions DME instruction;Gait training;Stair  training;Therapeutic exercise;Therapeutic activities;Balance training    PT Goals (Current goals can be found in the Care Plan section)  Acute Rehab PT Goals Patient Stated Goal: to get better PT Goal Formulation: With patient Time For Goal Achievement: 03/17/19 Potential to Achieve Goals: Good    Frequency 7X/week   Barriers to discharge        Co-evaluation               AM-PAC PT "6 Clicks" Mobility  Outcome Measure Help needed turning from your back to your side while in a flat bed without using bedrails?: A Little Help needed moving from lying on your back to sitting on the side of a flat bed without using bedrails?: A Little Help needed moving to and from a bed to a chair (including a wheelchair)?: A Lot Help needed standing up from a chair using your arms (e.g., wheelchair or bedside chair)?: A Little Help needed to walk in hospital room?: A Lot Help needed climbing 3-5 steps with a railing? : A Lot 6 Click Score: 15    End of Session Equipment Utilized During Treatment: Gait belt Activity Tolerance: Patient tolerated treatment well Patient left: in chair;with chair alarm set;with nursing/sitter in room Nurse Communication: Mobility status PT Visit Diagnosis: Unsteadiness on feet (R26.81);Muscle weakness (generalized) (M62.81);History of falling (Z91.81);Difficulty in walking, not elsewhere classified (R26.2)  Time: 3474-2595 PT Time Calculation (min) (ACUTE ONLY): 46 min   Charges:   PT Evaluation $PT Eval Moderate Complexity: 1 Mod PT Treatments $Therapeutic Exercise: 8-22 mins $Therapeutic Activity: 8-22 mins        Greggory Stallion, PT, DPT [phone]   Ajamu Maxon 03/03/2019, 2:48 PM

## 2019-03-03 NOTE — Progress Notes (Signed)
Rehab Admissions Coordinator Note:  Per PT recommendation, patient was screened by Michel Santee, PT, DPT for appropriateness for an Inpatient Acute Rehab Consult.  At this time, we are recommending Inpatient Rehab consult.  I will place an order.   Michel Santee, PT, DPT 03/03/2019, 2:58 PM  I can be reached at [phone].

## 2019-03-03 NOTE — Progress Notes (Signed)
Occupational Therapy Treatment Patient Details Name: [name]. MRN: [id number] DOB: [birth year] Today's Date: 03/03/2019    History of present illness 58 y.o. male with a history of HTN, HLD and DM who reports being at baseline until coming out of the shower at 0100 this morning.  He was coming out of the shower when he developed sudden severe headache that he described as throbbing, sharp, generalized, 10 out of 10, associated with dizziness.  Patient reports that he felt like his R leg was weaker than the L and he was unable to walk straight without leaning to the right. Has been having headaches over the past 2-3 weeks without associated focal neurological symptoms. 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   OT comments  Pt needed to use the [facility] and was impulsive getting up and sitting at EOB and finding midline and slowing down movements to be safe before standing with mod assist with max cues due to safety and problem solving with motor planning.  He also has decreased vision in B eyes and keeps alternating opening and closing his eyes due to visual changes.  Numbness in R hand increased as he was pushing on BSC to help with stand and leaning to R to use L hand to wipe and needed mod assist for thoroughness of clean up and mod cues for safety again.  Attempted to work on eye exercises but pt had difficulty concentrating while on [facility] but was able to participate in RUE and hand exercises and prep for clean up after having BM.  He had several episodes of urine while sitting on BSC which is why he needed to sit on [facility] for so long.  Pt would benefit from CIR vs SNF to regain independence in ADLs, functional mobility and safety to prevent falls and be as independent as possible before returning to his wife at home.  Follow Up Recommendations  CIR    Equipment Recommendations  3 in 1 bedside commode    Recommendations for Other Services      Precautions / Restrictions  Precautions Precautions: Fall Restrictions Weight Bearing Restrictions: No       Mobility Bed Mobility Overal bed mobility: Needs Assistance Bed Mobility: Supine to Sit     Supine to sit: Min assist     General bed mobility comments: needs cues for initiation of activity with specific cues on how to perform task. Once seated at EOB, mild R side lateral lean. Pt aware of impairement and able to self correct, however struggles to maintain upright posture without cga.   Transfers Overall transfer level: Needs assistance Equipment used: Rolling walker (2 wheeled) Transfers: Sit to/from Stand Sit to Stand: Mod assist         General transfer comment: attempted standing with RW. Heavy R side lean requiring mod/max assist to correct. Pt very unsteady. 2nd attempt without RW, guarding performed infront of pt with improved ability to weight shift equally. Still required heavy assist to maintain upright posture.    Balance Overall balance assessment: History of Falls;Needs assistance Sitting-balance support: Feet supported;Bilateral upper extremity supported Sitting balance-Leahy Scale: Fair Sitting balance - Comments: mild R side lean   Standing balance support: Single extremity supported Standing balance-Leahy Scale: Poor Standing balance comment: heavy post lateral R lean                           ADL either performed or assessed with clinical judgement  ADL Overall ADL's : Needs assistance/impaired                         Toilet Transfer: BSC;Moderate assistance;Set up;RW Toilet Transfer Details (indicate cue type and reason): Pt needed to use the [facility] and was impulsive getting up and sitting at EOB and finding midline and slowing down movements to be safe before standing with mod assist with max cues due to safety and problem solving with motor planning.  He also has decreased vision in B eyes and keeps alternating opening and closing his eyes due to  visual changes.  Numbness in R hand increased as he was pushing on BSC to help with stand and leaning to R to use L hand to wipe and needed mod assist for thoroughness of clean up and mod cues for safety again.                 Vision       Perception     Praxis      Cognition Arousal/Alertness: Awake/alert Behavior During Therapy: Impulsive Overall Cognitive Status: History of cognitive impairments - at baseline                                 General Comments: alert and oriented, however very tangential in conversation and demonstrates lack of safety awareness.  Difficulty with concentrating on vision exercises but did well with directions for using commode safely and RUE and hand exercises.        Exercises Exercises: Other exercises Other Exercises Other Exercises: supine ther-ex performed on B LE including AP, SLRs, hip abd/add, and heel slides. All ther-ex performed with supervision x 10 reps Other Exercises: stood at bedside needing min assist to maintain balance while using urinal. Pt then ambulated to [facility] for BM. +2 assist to stand and perform hygiene   Shoulder Instructions       General Comments      Pertinent Vitals/ Pain       Pain Assessment: No/denies pain Faces Pain Scale: Hurts little more Pain Location: R abdomen Pain Descriptors / Indicators: Aching Pain Intervention(s): Limited activity within patient's tolerance;Patient requesting pain meds-RN notified  Home Living Family/patient expects to be discharged to:: Private residence Living Arrangements: Spouse/significant other Available Help at Discharge: Family Type of Home: Mobile home Home Access: Ramped entrance     [place]: One level     Bathroom Shower/Tub: Teacher, early years/pre: [place]: Hand held shower head(reports his walker is broken)          Prior Functioning/Environment Level of Independence: Independent         Comments: Pt reports indep with ADL, mobility, and is primary caregiver for spouse for ADL, aide assists pt's spouse with meal prep/cleaning; 1 fall just prior to this admission   Frequency  Min 3X/week        Progress Toward Goals  OT Goals(current goals can now be found in the care plan section)  Progress towards OT goals: Progressing toward goals  Acute Rehab OT Goals Patient Stated Goal: to regain use of RUE and hand and be more independent OT Goal Formulation: With patient Time For Goal Achievement: 03/16/19 Potential to Achieve Goals: Good  Plan Discharge plan needs to be updated    Co-evaluation  AM-PAC OT "6 Clicks" Daily Activity     Outcome Measure   Help from another person eating meals?: A Little Help from another person taking care of personal grooming?: A Little Help from another person toileting, which includes using toliet, bedpan, or urinal?: A Lot Help from another person bathing (including washing, rinsing, drying)?: A Lot Help from another person to put on and taking off regular upper body clothing?: A Little Help from another person to put on and taking off regular lower body clothing?: A Lot 6 Click Score: 15    End of Session Equipment Utilized During Treatment: Gait belt  OT Visit Diagnosis: Other abnormalities of gait and mobility (R26.89);Hemiplegia and hemiparesis;Low vision, both eyes (H54.2);History of falling (Z91.81) Hemiplegia - Right/Left: Right Hemiplegia - dominant/non-dominant: Non-Dominant Hemiplegia - caused by: Cerebral infarction   Activity Tolerance Patient tolerated treatment well   Patient Left in bed;with call bell/phone within reach   Nurse Communication          Time: 2119-4174 OT Time Calculation (min): 43 min  Charges: OT General Charges $OT Visit: 1 Visit OT Treatments $Self Care/Home Management : 23-37 mins $Neuromuscular Re-education: 8-22 mins  Chrys Racer, OTR/L, [place]  [phone] 03/03/19, 3:30 PM

## 2019-03-04 LAB — GLUCOSE, CAPILLARY
Glucose-Capillary: 160 mg/dL — ABNORMAL HIGH (ref 70–99)
Glucose-Capillary: 197 mg/dL — ABNORMAL HIGH (ref 70–99)

## 2019-03-04 MED ORDER — MONTELUKAST SODIUM 10 MG PO TABS
10.0000 mg | ORAL_TABLET | Freq: Every day | ORAL | Status: DC
  Administered 2019-03-04 – 2019-03-05 (×2): 10 mg via ORAL
  Filled 2019-03-04 (×2): qty 1

## 2019-03-04 NOTE — Progress Notes (Addendum)
Update: CSW was given permission to speak with parole officer regarding patients concerns for leaving [facility] [phone])- patient does not have any restrictions to leave the county and is able to go to CIR if needed.  CSW spoke with patient via phone regarding discharge plans- he continues to prefer CIR as his current discharge plan and in the event CIR is not able to accept him he is agreeable to SNF.   CSW will continue to update.   Kingsley Spittle, LCSW [facility] [place]  [phone]

## 2019-03-04 NOTE — Progress Notes (Signed)
[facility] of [facility]

## 2019-03-04 NOTE — Progress Notes (Signed)
Inpatient Rehabilitation-Admissions Coordinator   Spoke with pt over the phone for rehabilitations assessment. Pt understands the recommended program is at a facility in [place]. He states that he is not allowed to leave [facility] [employer] [facility] and cannot do the program here at [facility] as it is across [employer].   I will sign off and contact TOC team regarding need for new dispo.   Jhonnie Garner, OTR/L  Rehab Admissions Coordinator  [phone] 03/04/2019 9:57 AM

## 2019-03-04 NOTE — Progress Notes (Signed)
Physical Therapy Treatment Patient Details Name: [name]. MRN: [id number] DOB: Oct 22, 1960 Today's Date: 03/04/2019    History of Present Illness 58 y.o. male with a history of HTN, HLD and DM who reports being at baseline until coming out of the shower at 0100 this morning.  He was coming out of the shower when he developed sudden severe headache that he described as throbbing, sharp, generalized, 10 out of 10, associated with dizziness.  Patient reports that he felt like his R leg was weaker than the L and he was unable to walk straight without leaning to the right. Has been having headaches over the past 2-3 weeks without associated focal neurological symptoms. 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    PT Comments    Pt is making good progress towards goals with improved upright balance this date. Less impulsive, however still lacks safety/judgement. Needs encouragement to participate in OOB mobility, however then asks if therapist can stay longer at end of session. Still complains of vision impairements, decreased nystagmus noted. Still has difficulty keeping eyes open (L more than R). Able to use RW during ambulation this date and is excited that he will be able to ambulate in room to bathroom instead of using BSC, updated white board. Discussed CIR recommendations and pt agreeable to go to CIR, however unable to cross county lines. Secure chat sent to CSW to seek medical clearance for CIR admission. Will continue to progress as able.   Follow Up Recommendations  CIR     Equipment Recommendations  Rolling walker with 5" wheels    Recommendations for Other Services       Precautions / Restrictions Precautions Precautions: Fall Restrictions Weight Bearing Restrictions: No    Mobility  Bed Mobility Overal bed mobility: Needs Assistance Bed Mobility: Supine to Sit     Supine to sit: Min assist     General bed mobility comments: needs cues for safety.  Dizziness noted upon sitting at EOB. Improved upright posture note during static sitting  Transfers Overall transfer level: Needs assistance Equipment used: Rolling walker (2 wheeled) Transfers: Sit to/from Stand Sit to Stand: Min assist         General transfer comment: cues for safe technique. Braces B LE against bed upon standing. Decreased lateral lean noted this date. Complaints of dizziness persist in standing.  Ambulation/Gait Ambulation/Gait assistance: Min assist Gait Distance (Feet): 40 Feet Assistive device: Rolling walker (2 wheeled) Gait Pattern/deviations: Step-to pattern     General Gait Details: needs encouragement to ambulate as pt fearful secondary to impaired balance. Min assist required with occasional mod assist during turns. Assist needed in tight spaces for turns as well as obstacle avoidance. Generally unsteady.    Stairs             Wheelchair Mobility    Modified Rankin (Stroke Patients Only)       Balance Overall balance assessment: History of Falls;Needs assistance Sitting-balance support: Feet supported;Bilateral upper extremity supported Sitting balance-Leahy Scale: Good Sitting balance - Comments: upright posture   Standing balance support: Bilateral upper extremity supported Standing balance-Leahy Scale: Fair Standing balance comment: mild R side leaning, improved                            Cognition Arousal/Alertness: Awake/alert Behavior During Therapy: WFL for tasks assessed/performed Overall Cognitive Status: History of cognitive impairments - at baseline  General Comments: still has lack of safety awareness and attention to task. More oriented this date      Exercises Other Exercises Other Exercises: supine ther-ex performed on B LE including AP, SLRs, hip abd/add, and ressisted heel slides. All ther-ex performed with supervision x 12 reps.     General Comments         Pertinent Vitals/Pain Pain Assessment: Faces Faces Pain Scale: Hurts little more Pain Location: R abdomen and nausea Pain Descriptors / Indicators: Aching Pain Intervention(s): Limited activity within patient's tolerance    Home Living                      Prior Function            PT Goals (current goals can now be found in the care plan section) Acute Rehab PT Goals Patient Stated Goal: to go home PT Goal Formulation: With patient Time For Goal Achievement: 03/17/19 Potential to Achieve Goals: Good Progress towards PT goals: Progressing toward goals    Frequency    7X/week      PT Plan Current plan remains appropriate    Co-evaluation              AM-PAC PT "6 Clicks" Mobility   Outcome Measure  Help needed turning from your back to your side while in a flat bed without using bedrails?: A Little Help needed moving from lying on your back to sitting on the side of a flat bed without using bedrails?: A Little Help needed moving to and from a bed to a chair (including a wheelchair)?: A Little Help needed standing up from a chair using your arms (e.g., wheelchair or bedside chair)?: A Little Help needed to walk in hospital room?: A Little Help needed climbing 3-5 steps with a railing? : A Lot 6 Click Score: 17    End of Session Equipment Utilized During Treatment: Gait belt Activity Tolerance: Patient tolerated treatment well Patient left: in bed;with bed alarm set Nurse Communication: Mobility status PT Visit Diagnosis: Unsteadiness on feet (R26.81);Muscle weakness (generalized) (M62.81);History of falling (Z91.81);Difficulty in walking, not elsewhere classified (R26.2)     Time: 8546-2703 PT Time Calculation (min) (ACUTE ONLY): 24 min  Charges:  $Gait Training: 8-22 mins $Therapeutic Exercise: 8-22 mins                     [name], PT, DPT [phone]    [name] 03/04/2019, 3:06 PM

## 2019-03-04 NOTE — Progress Notes (Signed)
PROGRESS NOTE    Alex Metro.  EHM:[id number] DOB: 1960/04/22 DOA: 03/02/2019 PCP: [name], NP    Brief Narrative:  Alex Trombly. is a 58 y.o. male with medical history significant of type 2 diabetes mellitus, hypertension, hyperlipidemia, mentally challenged, phenylketonuria, admitted for headache and weakness.  Patient reports that had a fall a few days ago and has been having headaches since.  He also reports dizziness.  He also feels weak mainly on the right side and tends to lean towards the right when he is walking. 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     Consultants:   neurology  Procedures: MRI/CTA  Antimicrobials:   none   Subjective: Reports headache, complains of dizziness.  Denies transfer to [facility] for CIR as he is on parole and has to stay within [place].  He is agreeable for skilled nursing facility placement locally  Objective: Vitals:   03/04/19 0605 03/04/19 0741 03/04/19 0806 03/04/19 1307  BP: (!) 159/84 (!) 169/85  (!) 142/89  Pulse: (!) 43 (!) 48 (!) 47 (!) 47  Resp:   16   Temp:    97.8 F (36.6 C)  TempSrc:    Oral  SpO2: 98% 98% 97% 99%  Weight:      Height:        Intake/Output Summary (Last 24 hours) at 03/04/2019 1328 Last data filed at 03/04/2019 0857 Gross per 24 hour  Intake 240 ml  Output 1400 ml  Net -1160 ml   Filed Weights   03/02/19 0222 03/02/19 2252  Weight: 79.4 kg 79.9 kg    Examination:  General exam: Appears calm and comfortable , NAD Respiratory system: Clear to auscultation. Respiratory effort normal. Cardiovascular system: S1 & S2 heard, RRR. No JVD, murmurs, rubs, gallops or clicks.  Gastrointestinal system: Abdomen is nondistended, soft and nontender. No organomegaly or masses felt. Normal bowel sounds heard. Central nervous system: Alert and oriented.Nystagmus. RUE 4/5, LUE 5/5, RLE 4/5 LLE 5/5. No facial drop. Extremities: Mild b/l edema, no  cyanosis Skin: warm, dry Psychiatry: Judgement and insight appear normal. Mood & affect appropriate.     Data Reviewed: I have personally reviewed following labs and imaging studies  CBC: Recent Labs  Lab 03/02/19 0311  WBC 9.2  NEUTROABS 7.1  HGB 14.4  HCT 40.5  MCV 86.7  PLT 366   Basic Metabolic Panel: Recent Labs  Lab 03/02/19 0311  NA 136  K 4.0  CL 103  CO2 24  GLUCOSE 274*  BUN 15  CREATININE 1.20  CALCIUM 9.1   GFR: Estimated Creatinine Clearance: 65.6 mL/min (by C-G formula based on SCr of 1.2 mg/dL). Liver Function Tests: Recent Labs  Lab 03/02/19 0311  AST 31  ALT 33  ALKPHOS 120  BILITOT 0.5  PROT 6.7  ALBUMIN 4.1   No results for input(s): LIPASE, AMYLASE in the last 168 hours. No results for input(s): AMMONIA in the last 168 hours. Coagulation Profile: Recent Labs  Lab 03/02/19 0311  INR 1.0   Cardiac Enzymes: No results for input(s): CKTOTAL, CKMB, CKMBINDEX, TROPONINI in the last 168 hours. BNP (last 3 results) No results for input(s): PROBNP in the last 8760 hours. HbA1C: Recent Labs    03/02/19 0311 03/03/19 0447  HGBA1C 8.2* 8.6*   CBG: Recent Labs  Lab 03/03/19 0835 03/03/19 1222 03/03/19 1637 03/03/19 2158 03/04/19 0831  GLUCAP 166* 167* 159* 233* 160*   Lipid Profile: Recent Labs  03/03/19 0447  CHOL 159  HDL 30*  LDLCALC 85  TRIG 222*  CHOLHDL 5.3   Thyroid Function Tests: No results for input(s): TSH, T4TOTAL, FREET4, T3FREE, THYROIDAB in the last 72 hours. Anemia Panel: No results for input(s): VITAMINB12, FOLATE, FERRITIN, TIBC, IRON, RETICCTPCT in the last 72 hours. Sepsis Labs: No results for input(s): PROCALCITON, LATICACIDVEN in the last 168 hours.  Recent Results (from the past 240 hour(s))  SARS CORONAVIRUS 2 (TAT 6-24 HRS) Nasopharyngeal Nasopharyngeal Swab     Status: None   Collection Time: 03/02/19  1:54 PM   Specimen: Nasopharyngeal Swab  Result Value Ref Range Status   SARS  Coronavirus 2 NEGATIVE NEGATIVE Final    Comment: (NOTE) SARS-CoV-2 target nucleic acids are NOT DETECTED. The SARS-CoV-2 RNA is generally detectable in upper and lower respiratory specimens during the acute phase of infection. Negative results do not preclude SARS-CoV-2 infection, do not rule out co-infections with other pathogens, and should not be used as the sole basis for treatment or other patient management decisions. Negative results must be combined with clinical observations, patient history, and epidemiological information. The expected result is Negative. Fact Sheet for Patients: SugarRoll.be Fact Sheet for Healthcare Providers: https://www.woods-mathews.com/ This test is not yet approved or cleared by the [place] FDA and  has been authorized for detection and/or diagnosis of SARS-CoV-2 by FDA under an Emergency Use Authorization (EUA). This EUA will remain  in effect (meaning this test can be used) for the duration of the COVID-19 declaration under Section 56 4(b)(1) of the Act, 21 U.S.C. section 360bbb-3(b)(1), unless the authorization is terminated or revoked sooner. Performed at [facility], [place] [address], [place], [place] [address]          Radiology Studies: CT ANGIO HEAD W OR WO CONTRAST  Result Date: 03/02/2019 CLINICAL DATA:  Acute infarct right medulla EXAM: CT ANGIOGRAPHY HEAD AND NECK TECHNIQUE: Multidetector CT imaging of the head and neck was performed using the standard protocol during bolus administration of intravenous contrast. Multiplanar CT image reconstructions and MIPs were obtained to evaluate the vascular anatomy. Carotid stenosis measurements (when applicable) are obtained utilizing NASCET criteria, using the distal internal carotid diameter as the denominator. CONTRAST:  35m OMNIPAQUE IOHEXOL 350 MG/ML SOLN COMPARISON:  MRI head and CT head 03/02/2019 FINDINGS: CT HEAD FINDINGS Brain: No  evidence of acute infarction, hemorrhage, hydrocephalus, extra-axial collection or mass lesion/mass effect. Acute infarct right medulla not identified by CT. Vascular: Negative for hyperdense vessel Skull: Negative Sinuses: Negative Orbits: Negative Review of the MIP images confirms the above findings CTA NECK FINDINGS Aortic arch: Standard branching. Imaged portion shows no evidence of aneurysm or dissection. No significant stenosis of the major arch vessel origins. Right carotid system: Mild atherosclerotic disease right carotid bulb without significant right carotid stenosis Left carotid system: Atherosclerotic disease proximal left internal carotid artery narrowing the lumen to 2.8 mm corresponding to approximately 20% diameter stenosis. Vertebral arteries: Left vertebral dominant. Left vertebral artery widely patent to the basilar Small right vertebral artery which ends in PICA. No prior studies available for comparison. Given the right medullary infarct, is not possible acute occlusion distal right vertebral artery or a congenital variation. Skeleton: No acute skeletal abnormality. Other neck: Negative Upper chest: Negative Review of the MIP images confirms the above findings CTA HEAD FINDINGS Anterior circulation: Cavernous carotid widely patent bilaterally without stenosis. Anterior and middle cerebral arteries patent bilaterally without stenosis. Posterior circulation: Left vertebral arteries widely patent to the basilar.  Left PICA not visualized. Left AICA is patent. Distal right vertebral artery ends in PICA. This is small vessel in this could be congenital variation however there is an acute right PICA infarct. The basilar is patent. Superior cerebellar and posterior cerebral arteries are patent bilaterally posterior communicating arteries are patent bilaterally. Venous sinuses: Patent Anatomic variants: None Review of the MIP images confirms the above findings IMPRESSION: 1. Acute infarct right medulla  not identified by CT. 2. Small right vertebral artery which ends in PICA. This could be congenital variation however acute occlusion distal right vertebral artery is a consideration given acute right PICA infarct. 3. 20% diameter stenosis proximal left internal carotid artery. No significant right carotid stenosis. Electronically Signed   By: Franchot Gallo M.D.   On: 03/02/2019 13:54   CT ANGIO NECK W OR WO CONTRAST  Result Date: 03/02/2019 CLINICAL DATA:  Acute infarct right medulla EXAM: CT ANGIOGRAPHY HEAD AND NECK TECHNIQUE: Multidetector CT imaging of the head and neck was performed using the standard protocol during bolus administration of intravenous contrast. Multiplanar CT image reconstructions and MIPs were obtained to evaluate the vascular anatomy. Carotid stenosis measurements (when applicable) are obtained utilizing NASCET criteria, using the distal internal carotid diameter as the denominator. CONTRAST:  65m OMNIPAQUE IOHEXOL 350 MG/ML SOLN COMPARISON:  MRI head and CT head 03/02/2019 FINDINGS: CT HEAD FINDINGS Brain: No evidence of acute infarction, hemorrhage, hydrocephalus, extra-axial collection or mass lesion/mass effect. Acute infarct right medulla not identified by CT. Vascular: Negative for hyperdense vessel Skull: Negative Sinuses: Negative Orbits: Negative Review of the MIP images confirms the above findings CTA NECK FINDINGS Aortic arch: Standard branching. Imaged portion shows no evidence of aneurysm or dissection. No significant stenosis of the major arch vessel origins. Right carotid system: Mild atherosclerotic disease right carotid bulb without significant right carotid stenosis Left carotid system: Atherosclerotic disease proximal left internal carotid artery narrowing the lumen to 2.8 mm corresponding to approximately 20% diameter stenosis. Vertebral arteries: Left vertebral dominant. Left vertebral artery widely patent to the basilar Small right vertebral artery which ends in  PICA. No prior studies available for comparison. Given the right medullary infarct, is not possible acute occlusion distal right vertebral artery or a congenital variation. Skeleton: No acute skeletal abnormality. Other neck: Negative Upper chest: Negative Review of the MIP images confirms the above findings CTA HEAD FINDINGS Anterior circulation: Cavernous carotid widely patent bilaterally without stenosis. Anterior and middle cerebral arteries patent bilaterally without stenosis. Posterior circulation: Left vertebral arteries widely patent to the basilar. Left PICA not visualized. Left AICA is patent. Distal right vertebral artery ends in PICA. This is small vessel in this could be congenital variation however there is an acute right PICA infarct. The basilar is patent. Superior cerebellar and posterior cerebral arteries are patent bilaterally posterior communicating arteries are patent bilaterally. Venous sinuses: Patent Anatomic variants: None Review of the MIP images confirms the above findings IMPRESSION: 1. Acute infarct right medulla not identified by CT. 2. Small right vertebral artery which ends in PICA. This could be congenital variation however acute occlusion distal right vertebral artery is a consideration given acute right PICA infarct. 3. 20% diameter stenosis proximal left internal carotid artery. No significant right carotid stenosis. Electronically Signed   By: CFranchot GalloM.D.   On: 03/02/2019 13:54   ECHOCARDIOGRAM COMPLETE  Result Date: 03/03/2019   ECHOCARDIOGRAM REPORT   Patient Name:   [name] Date of Exam: 03/02/2019 Medical Rec #:  [id number]  Height:       64.0 in Accession #:    [id number]             Weight:       175.0 lb Date of Birth:  09-Mar-1961             BSA:          1.85 m Patient Age:    41 years               BP:           137/82 mmHg Patient Gender: M                      HR:           48 bpm. Exam Location:  [facility] Procedure: 2D Echo,  Cardiac Doppler and Color Doppler Indications:     I163.9 Stroke  History:         Patient has prior history of Echocardiogram examinations, most                  recent 11/04/2017. Risk Factors:Hypertension, Diabetes and                  Dyslipidemia. Asthma. Arrhythmia.  Sonographer:     Wilford Sports Rodgers-Jones Referring Phys:  [place] Diagnosing Phys: Nelva Bush MD IMPRESSIONS  1. Left ventricular ejection fraction, by visual estimation, is 60 to 65%. The left ventricle has normal function. There is no left ventricular hypertrophy.  2. The left ventricle has no regional wall motion abnormalities.  3. Global right ventricle has normal systolic function.The right ventricular size is normal. Right vetricular wall thickness was not assessed.  4. Left atrial size was normal.  5. Right atrial size was normal.  6. The mitral valve is normal in structure. Trivial mitral valve regurgitation.  7. The tricuspid valve is grossly normal. Tricuspid valve regurgitation is trivial.  8. The aortic valve was not well visualized. Aortic valve regurgitation is not visualized. No evidence of aortic valve sclerosis or stenosis.  9. The pulmonic valve was not well visualized. Pulmonic valve regurgitation is not visualized. 10. TR signal is inadequate for assessing pulmonary artery systolic pressure. 11. The inferior vena cava is normal in size with greater than 50% respiratory variability, suggesting right atrial pressure of 3 mmHg. 12. The interatrial septum was not well visualized. FINDINGS  Left Ventricle: Left ventricular ejection fraction, by visual estimation, is 60 to 65%. The left ventricle has normal function. The left ventricle has no regional wall motion abnormalities. The left ventricular internal cavity size was the left ventricle is normal in size. There is no left ventricular hypertrophy. Left ventricular diastolic parameters were normal. Right Ventricle: The right ventricular size is normal. Right  vetricular wall thickness was not assessed. Global RV systolic function is has normal systolic function. Left Atrium: Left atrial size was normal in size. Right Atrium: Right atrial size was normal in size Pericardium: There is no evidence of pericardial effusion. Mitral Valve: The mitral valve is normal in structure. Trivial mitral valve regurgitation. Tricuspid Valve: The tricuspid valve is grossly normal. Tricuspid valve regurgitation is trivial. Aortic Valve: The aortic valve was not well visualized. Aortic valve regurgitation is not visualized. The aortic valve is structurally normal, with no evidence of sclerosis or stenosis. Pulmonic Valve: The pulmonic valve was not well visualized. Pulmonic valve regurgitation is not visualized. Pulmonic regurgitation is not visualized. No evidence of pulmonic stenosis.  Aorta: The aortic root is normal in size and structure. Pulmonary Artery: The pulmonary artery is not well seen. Venous: The inferior vena cava is normal in size with greater than 50% respiratory variability, suggesting right atrial pressure of 3 mmHg. IAS/Shunts: The interatrial septum was not well visualized.  LEFT VENTRICLE PLAX 2D LVIDd:         4.87 cm Diastology LVIDs:         3.22 cm LV e' lateral:   14.80 cm/s LV PW:         0.65 cm LV E/e' lateral: 5.9 LV IVS:        0.68 cm LV e' medial:    9.90 cm/s LV SV:         70 ml   LV E/e' medial:  8.8 LV SV Index:   36.28  RIGHT VENTRICLE RV Basal diam:  3.99 cm RV S prime:     14.60 cm/s TAPSE (M-mode): 3.4 cm LEFT ATRIUM             Index       RIGHT ATRIUM           Index LA diam:        3.80 cm 2.06 cm/m  RA Area:     15.50 cm LA Vol (A2C):   52.0 ml 28.13 ml/m RA Volume:   42.40 ml  22.94 ml/m LA Vol (A4C):   23.6 ml 12.77 ml/m LA Biplane Vol: 35.4 ml 19.15 ml/m   AORTA Ao Root diam: 3.30 cm MITRAL VALVE MV Area (PHT): 3.91 cm MV PHT:        56.26 msec MV Decel Time: 194 msec MV E velocity: 86.80 cm/s 103 cm/s MV A velocity: 47.50 cm/s 70.3  cm/s MV E/A ratio:  1.83       1.5  Harrell Gave End MD Electronically signed by Nelva Bush MD Signature Date/Time: 03/03/2019/8:31:38 PM    Final         Scheduled Meds: . aspirin EC  81 mg Oral Daily  . budesonide (PULMICORT) nebulizer solution  0.25 mg Nebulization BID  . clopidogrel  75 mg Oral Daily  . enoxaparin (LOVENOX) injection  40 mg Subcutaneous Q24H  . fenofibrate  54 mg Oral Daily  . gabapentin  300 mg Oral BID  . insulin aspart  0-9 Units Subcutaneous TID WC  . montelukast  10 mg Oral QHS  . pantoprazole  40 mg Oral Daily  . simvastatin  40 mg Oral Daily  . 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  No significant stenosis (please see full report Echo within normal limit Neurology following-recommend Dual antiplatelet therapy with ASA 4m and Plavix 726mfor three weeks with change toASA 8185maily LDL target less than 70 PT/OT-OT recommends SNF/CIR  Type 2 diabetes mellitus -sliding scale insulin H A1c on 03/03/2019 was 8.6  Hyperlipidemia LDL 85 , triglyceride 222 Continue with fenofibrate and simvastatin aggressive therapy  Essential hypertension -allow permissive hypertension, keeping around 160 today, with slowly improving .    DVT prophylaxis: Lovenox  Code Status: Full code  Family Communication: none at bedside Disposition Plan: Refusing CIR, he is on parole.  [place]  He  is agreeable for skilled nursing facility in the area.  TOC team aware       LOS: 2 days   Time spent: 25 minutes with more than 50% on COC    Max Sane, MD [facility] Pager [phone]  If 7PM-7AM, please contact  night-coverage [email or web address] Password [email or web address] 03/04/2019, 1:28 PM

## 2019-03-04 NOTE — Progress Notes (Signed)
PT awakened for q 4 neurological assessment. Pt was not interactive. He was however agreeable to collection of BP and pulse.

## 2019-03-04 NOTE — Progress Notes (Signed)
SLP Cancellation Note  Patient Details Name: [name]. MRN: [id number] DOB: 21-Oct-1960   Cancelled treatment:       Reason Eval/Treat Not Completed: (chart reviewed; consulted NSG). Pt has been tolerating his current diet per NSG report this morning. No reports of swallowing deficits per NSG, pt. However, pt is suppose to be drinking more "water" per NSG, and pt does not like "water". He continues to give reasons and request other drinks including sodas instead.  As pt is Not having any overt s/s of dysphagia when drinking thin liquids and appears at his baseline, ST services will sign off at this time. MD to reconsult if any decline in swallow function is noted during admit. Recommend general aspiration precautions continue; NSG agreed.     Orinda Kenner, MS, CCC-SLP Krishika Bugge 03/04/2019, 11:45 AM

## 2019-03-04 NOTE — Progress Notes (Addendum)
Inpatient Rehabilitation-Admissions Coordinator  *Placed rehab consult back in chart for further assessment.   Was notified by CSW that the pt's parole officer said he is able to cross county lines and can go to CIR if needed. I spoke with patient again via phone who did express interest in IP Rehab program. I confirmed DC support from his wife and we discussed estimated length of stay, expectations, and anticipated functional outcomes. I will begin insurance approval process for possible admit to CIR.   Please call if questions.   Jhonnie Garner, OTR/L  Rehab Admissions Coordinator  [phone] 03/04/2019 3:57 PM

## 2019-03-04 NOTE — Progress Notes (Signed)
Inpatient Diabetes Program Recommendations  AACE/ADA: New Consensus Statement on Inpatient Glycemic Control   Target Ranges:  Prepandial:   less than 140 mg/dL      Peak postprandial:   less than 180 mg/dL (1-2 hours)      Critically ill patients:  140 - 180 mg/dL   Results for [name], [name] (MRN [id number]) as of 03/04/2019 14:19  Ref. Range 03/03/2019 08:35 03/03/2019 12:22 03/03/2019 16:37 03/03/2019 21:58 03/04/2019 08:31  Glucose-Capillary Latest Ref Range: 70 - 99 mg/dL 166 (H) 167 (H) 159 (H) 233 (H) 160 (H)  Results for [name], [name] (MRN [id number]) as of 03/04/2019 14:19  Ref. Range 03/02/2019 03:11 03/03/2019 04:47  Hemoglobin A1C Latest Ref Range: 4.8 - 5.6 % 8.2 (H) 8.6 (H)   Review of Glycemic Control  Diabetes history:DM2 Outpatient Diabetes medications: Invokana 300 mg daily, Metformin daily (Metformin not listed on home med list but patient reports taking it once a day) Current orders for Inpatient glycemic control: Novolog 0-9 units TID with meals  Inpatient Diabetes Program Recommendations:   Insulin-Meal Coverage: Please consider ordering Novolog 3 units TID with meals for meal coverage if patient eats at least 50% of meals.  NOTE: Spoke with patient over the phone about diabetes and home regimen for diabetes control. Patient reports being followed by PCP for diabetes management and currently taking Invokana 300 mg daily and Metformin daily. Metformin not listed on home medication list and patient is not sure of Metformin dose but notes he is taking it once a day.  Patient reports that he takes DM medications consistently. Patient checks glucose once a day and it is usually in the 100's mg/dl in the morning fasting.  Discussed A1C results (8.6% on 03/03/19 ) and explained that current A1C indicates an average glucose of 200 mg/dl over the past 2-3 months. Discussed glucose and A1C goals. Discussed importance of checking CBGs and maintaining good CBG  control to prevent long-term and short-term complications. Discussed impact of nutrition, exercise, stress, sickness, and medications on diabetes control.  Discussed carbohydrates, carbohydrate goals per day and meal, along with portion sizes. Patient states that he has changed the way he was eating and has been trying to limit portions which has helped. Patient reports that he has lost 10 pounds since making changes with diet.  Patient reports that he drinks sweet tea (with 1/2 the amount of sugar than he use to use). Encouraged patient to eliminate sugar from drinks.  Encouraged patient to check glucose at least 2 times per day to see if glucose is going up higher during the day after eating. Encouraged patient to follow up with PCP about DM control and be sure to take glucometer with him to appointments.   Patient verbalized understanding of information discussed and reports no further questions at this time related to diabetes.  Thanks, Barnie Alderman, RN, MSN, CDE Diabetes Coordinator Inpatient Diabetes Program [phone] (Team Pager)

## 2019-03-05 ENCOUNTER — Inpatient Hospital Stay: Payer: Medicare Other

## 2019-03-05 DIAGNOSIS — F0781 Postconcussional syndrome: Secondary | ICD-10-CM

## 2019-03-05 LAB — BASIC METABOLIC PANEL
Anion gap: 9 (ref 5–15)
BUN: 12 mg/dL (ref 6–20)
CO2: 24 mmol/L (ref 22–32)
Calcium: 8.9 mg/dL (ref 8.9–10.3)
Chloride: 108 mmol/L (ref 98–111)
Creatinine, Ser: 1.03 mg/dL (ref 0.61–1.24)
GFR calc Af Amer: >60 mL/min (ref >60)
GFR calc non Af Amer: >60 mL/min (ref >60)
Glucose, Bld: 217 mg/dL — ABNORMAL HIGH (ref 70–99)
Potassium: 3.7 mmol/L (ref 3.5–5.1)
Sodium: 141 mmol/L (ref 135–145)

## 2019-03-05 LAB — GLUCOSE, CAPILLARY
Glucose-Capillary: 217 mg/dL — ABNORMAL HIGH (ref 70–99)
Glucose-Capillary: 213 mg/dL — ABNORMAL HIGH (ref 70–99)
Glucose-Capillary: 224 mg/dL — ABNORMAL HIGH (ref 70–99)
Glucose-Capillary: 206 mg/dL — ABNORMAL HIGH (ref 70–99)
Glucose-Capillary: 251 mg/dL — ABNORMAL HIGH (ref 70–99)

## 2019-03-05 LAB — CBC
HCT: 44.1 % (ref 39.0–52.0)
Hemoglobin: 14.7 g/dL (ref 13.0–17.0)
MCH: 30.4 pg (ref 26.0–34.0)
MCHC: 33.3 g/dL (ref 30.0–36.0)
MCV: 91.3 fL (ref 80.0–100.0)
Platelets: 159 10*3/uL (ref 150–400)
RBC: 4.83 MIL/uL (ref 4.22–5.81)
RDW: 12.4 % (ref 11.5–15.5)
WBC: 6.5 10*3/uL (ref 4.0–10.5)
nRBC: 0 % (ref 0.0–0.2)

## 2019-03-05 MED ORDER — ATORVASTATIN CALCIUM 20 MG PO TABS
40.0000 mg | ORAL_TABLET | Freq: Every day | ORAL | Status: DC
  Administered 2019-03-05: 21:00:00 40 mg via ORAL
  Filled 2019-03-05: qty 2

## 2019-03-05 MED ORDER — LACTULOSE 10 GM/15ML PO SOLN
20.0000 g | Freq: Two times a day (BID) | ORAL | Status: DC
  Administered 2019-03-05 – 2019-03-06 (×3): 20 g via ORAL
  Filled 2019-03-05 (×3): qty 30

## 2019-03-05 MED ORDER — PROMETHAZINE HCL 25 MG/ML IJ SOLN
12.5000 mg | Freq: Four times a day (QID) | INTRAMUSCULAR | Status: DC | PRN
  Administered 2019-03-05: 12.5 mg via INTRAVENOUS
  Filled 2019-03-05: qty 1

## 2019-03-05 NOTE — PMR Pre-admission (Signed)
PMR Admission Coordinator Pre-Admission Assessment  Patient: [name]. is an 58 y.o., male MRN: [id number] DOB: 1960-06-19 Height: 5' 4" (162.6 cm) Weight: 79.9 kg  Insurance Information HMO: Yes    PPO:      PCP:      IPA:      80/20:      OTHER:  PRIMARY: U[employer] Medicare      Policy#: [id number]      Subscriber: Patient CM Name: Daphene Jaeger      Phone#: did not provide      Fax#: [phone] Pre-Cert#: [id number]      Employer:  [employer]m Kaufmann provided by Daphene Jaeger at [facility] for CIR approval. Pt is approved for 7 days with start start date of 12/17. Next review date is 12/23. Clinical updates are due to (f): [id number] Benefits:  Phone #: online     Name: [email or web address] Eff. Date: 08/18/2018 - 03/19/2019     Deduct: does not have ($0)      Out of Pocket Max: $3,600 ($142.42 met -this resents on 03/20/2019      Life Max: NA CIR: $295/day co-pay for days 1-5, $0/day co-pay for days 6+      SNF: $0/day co-pay for days 1-20, $160/day co-pay 21-43, $0/day co-pay for days 44-100; limited to 100 days/cal yr Outpatient: $30/visit co-pay; limited by medical necessity   Home Health: 100% coverage; limited by medical necessity     DME: 80% coverage     Co-Pay: 20% co-insurance Providers:  *pt will need to be out before Jan 1st as he is concerned about co-payments.  SECONDARY: None      Policy#:       Subscriber:  CM Name:       Phone#:     Fax#:  Pre-Cert#:       Employer:  Benefits:  Phone #:      Name:  Eff. Date:      Deduct:      Out of Pocket Max:       Life Max:  CIR:       SNF:  Outpatient:     Co-Pay:  Home Health:       Co-Pay:  DME:      Co-Pay:  Medicaid Application Date      Case Manager:  Disability Application Date:       Case Worker:   The "Data Collection Information Summary" for patients in Inpatient Rehabilitation Facilities with attached "Privacy Act [place] Records" was provided and verbally reviewed with: Family  Emergency Contact Information Contact  Information    Name Relation Home Work [place] Spouse [phone]     K[place], [place]hol[place] Mother [place] Other   [phone]      Current Medical History  Patient Admitting Diagnosis: Acute right lateral medullary infarct  History of Present Illness: Pt is a 58 yo Male with history of type 2 DM, hypertension, hyperlipidemia, mentally challenged, and phenylketonuria. Pt admitted to [facility] after a fall a few days ago with persistent headaches ever since. Pt also complained on dizziness and right sided weakness. 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 His echo was normal and neurology has recommended dual antiplatelet therapy. Pt has been seen by therapies with recommendation for CIR. Pt is to admit to CIR on 03/06/2019.   Complete NIHSS TOTAL: 0  Patient's medical [facility] from [facility] has been reviewed by the rehabilitation admission coordinator and physician.  Past Medical History  Past Medical History:  Diagnosis Date  . Arrhythmia   . Asthma   . Cyst of kidney, acquired   . Diabetes mellitus without complication ([place]) 2703   type 2  . GERD (gastroesophageal reflux disease)   . History of chicken pox   . History of measles as a child   . History of PKU   . Hyperlipidemia   . Hypertension   . IBS (irritable bowel syndrome)   . Irregular heart beat   . Mentally challenged   . Pancreatitis     Family History   family history includes Cancer in his maternal grandfather, maternal grandmother, mother, and paternal grandfather; Diabetes in his father; Heart disease in his father.  Prior Rehab/Hospitalizations Has the patient had prior rehab or hospitalizations prior to admission? No  Has the patient had major surgery during 100 days prior to admission? No   Current Medications  Current Facility-Administered Medications:  .  acetaminophen (TYLENOL) tablet 650 mg, 650 mg, Oral, Q4H PRN, 650 mg  at 03/06/19 0813 **OR** acetaminophen (TYLENOL) 160 MG/5ML solution 650 mg, 650 mg, Per Tube, Q4H PRN **OR** acetaminophen (TYLENOL) suppository 650 mg, 650 mg, Rectal, Q4H PRN, Cruzita Lederer, Costin M, MD .  aspirin EC tablet 81 mg, 81 mg, Oral, Daily, Caren Griffins, MD, 81 mg at 03/05/19 1017 .  atorvastatin (LIPITOR) tablet 40 mg, 40 mg, Oral, q1800, Lavina Hamman, MD, 40 mg at 03/05/19 2121 .  budesonide (PULMICORT) nebulizer solution 0.25 mg, 0.25 mg, Nebulization, BID, Oswald Hillock, RPH, 0.25 mg at 03/06/19 0725 .  clopidogrel (PLAVIX) tablet 75 mg, 75 mg, Oral, Daily, Alexis Goodell, MD, 75 mg at 03/05/19 1017 .  enoxaparin (LOVENOX) injection 40 mg, 40 mg, Subcutaneous, Q24H, Caren Griffins, MD, 40 mg at 03/05/19 2122 .  fenofibrate tablet 54 mg, 54 mg, Oral, Daily, Caren Griffins, MD, 54 mg at 03/05/19 1025 .  gabapentin (NEURONTIN) capsule 300 mg, 300 mg, Oral, BID, Caren Griffins, MD, 300 mg at 03/05/19 2121 .  insulin aspart (novoLOG) injection 0-9 Units, 0-9 Units, Subcutaneous, TID WC, Caren Griffins, MD, 2 Units at 03/06/19 0813 .  lactulose (CHRONULAC) 10 GM/15ML solution 20 g, 20 g, Oral, BID, Lavina Hamman, MD, 20 g at 03/05/19 2122 .  meclizine (ANTIVERT) tablet 12.5 mg, 12.5 mg, Oral, TID, Lavina Hamman, MD .  montelukast (SINGULAIR) tablet 10 mg, 10 mg, Oral, QHS, Max Sane, MD, 10 mg at 03/05/19 2121 .  ondansetron (ZOFRAN) injection 4 mg, 4 mg, Intravenous, Q6H PRN, Lang Snow, NP, 4 mg at 03/05/19 1017 .  pantoprazole (PROTONIX) EC tablet 40 mg, 40 mg, Oral, Daily, Caren Griffins, MD, 40 mg at 03/05/19 1018 .  promethazine (PHENERGAN) injection 12.5 mg, 12.5 mg, Intravenous, Q6H PRN, Lavina Hamman, MD, 12.5 mg at 03/05/19 1530  Patients Current Diet:  Diet Order            Diet - low sodium heart healthy        Diet Carb Modified Fluid consistency: Thin; Room service appropriate? Yes with Assist  Diet effective now               Precautions / Restrictions Precautions Precautions: Fall Restrictions Weight Bearing Restrictions: No   Has the patient had 2 or more falls or a fall with injury in the past year? Yes  Prior Activity Level Limited Community (1-2x/wk): not too active in the community; on disability. drove PTA  Prior Functional Level Self Care: Did the patient need help bathing, dressing, using the toilet or eating? Independent  Indoor Mobility: Did the patient need assistance with walking from room to room (with or without device)? Independent  Stairs: Did the patient need assistance with internal or external stairs (with or without device)? Independent  Functional Cognition: Did the patient need help planning regular tasks such as shopping or remembering to take medications? Needed some help  Home Assistive Devices / Equipment Home Assistive Devices/Equipment: CBG Meter, Blood pressure cuff Home Equipment: Hand held shower head(reports his walker is broken)  Prior Device Use: Indicate devices/aids used by the patient prior to current illness, exacerbation or injury? Walker  Current Functional Level Cognition  Overall Cognitive Status: History of cognitive impairments - at baseline Orientation Level: Oriented X4 General Comments: still has lack of safety awareness and attention to task. needs cues to foucs on tasks at times.  LOB's occur more when he is distracted.    Extremity Assessment (includes Sensation/Coordination)  Upper Extremity Assessment: Overall WFL for tasks assessed RUE Deficits / Details: grossly 4-/5 at shoulder, 4/5 elbow flex/ext, grip; reports impaired sensation and impaired FMC with testing RUE Sensation: decreased light touch RUE Coordination: decreased fine motor, decreased gross motor  Lower Extremity Assessment: Generalized weakness(R LE grossly 4-/5; L LE grossly 4/5) RLE Deficits / Details: grossly at least 3+/5, impaired sensation per pt RLE Sensation:  decreased light touch RLE Coordination: decreased gross motor    ADLs  Overall ADL's : Needs assistance/impaired Toilet Transfer: BSC, Moderate assistance, Set up, RW Toilet Transfer Details (indicate cue type and reason): Pt needed to use the [facility] and was impulsive getting up and sitting at EOB and finding midline and slowing down movements to be safe before standing with mod assist with max cues due to safety and problem solving with motor planning.  He also has decreased vision in B eyes and keeps alternating opening and closing his eyes due to visual changes.  Numbness in R hand increased as he was pushing on BSC to help with stand and leaning to R to use L hand to wipe and needed mod assist for thoroughness of clean up and mod cues for safety again. General ADL Comments: fxl mobility deferred 2/2 pt declining (headache), Max A for LB ADL, Min A UB ADL    Mobility  Overal bed mobility: Needs Assistance Bed Mobility: Supine to Sit Supine to sit: Min assist General bed mobility comments: needs cues for safety. Dizziness noted upon sitting at EOB. Improved upright posture note during static sitting    Transfers  Overall transfer level: Needs assistance Equipment used: Rolling walker (2 wheeled) Transfers: Sit to/from Stand Sit to Stand: Min assist General transfer comment: cues for safe technique. Braces B LE against bed upon standing. Decreased lateral lean noted this date. Complaints of dizziness persist in standing.    Ambulation / Gait / Stairs / Wheelchair Mobility  Ambulation/Gait Ambulation/Gait assistance: Herbalist (Feet): 70 Feet Assistive device: Rolling walker (2 wheeled) Gait Pattern/deviations: Step-through pattern, Decreased step length - right, Decreased step length - left General Gait Details: 49' x 2 with min a x 1. LOB when distracted with cues to attend to gait. Gait velocity: decreased    Posture / Balance Dynamic Sitting Balance Sitting balance -  Comments: upright posture Balance Overall balance assessment: Needs assistance Sitting-balance support: Feet supported, Bilateral upper extremity supported Sitting balance-Leahy Scale: Good Sitting balance - Comments: upright posture Standing balance support:  Bilateral upper extremity supported Standing balance-Leahy Scale: Fair Standing balance comment: no R lean noted today    Special needs/care consideration BiPAP/CPAP : no CPM : no Continuous Drip IV : no Dialysis : no        Days : no Life Vest : no Oxygen : no Special Bed : no Trach Size : no Wound Vac (area) :no      Location : no Skin : excoriated right arm                            Bowel mgmt: last BM 03/04/2019 Bladder mgmt: continent Diabetic mgmt: yes Behavioral consideration : history of phenylketonuria with cognitive deficits at baseline Chemo/radiation : no   Previous Home Environment (from acute therapy documentation) Living Arrangements: Spouse/significant other Available Help at Discharge: Family Type of Home: Mobile home Home Layout: One level Home Access: Ramped entrance Bathroom Shower/Tub: Chiropodist: Standard Home Care Services: No  Discharge Living Setting Plans for Discharge Living Setting: Mobile Home(lives with wife) Type of Home at Discharge: Mobile home Discharge Home Layout: One level Discharge Home Access: Ramped entrance Discharge Bathroom Shower/Tub: Tub/shower unit Discharge Bathroom Toilet: Standard Discharge Bathroom Accessibility: Yes How Accessible: Accessible via walker Does the patient have any problems obtaining your medications?: Yes (Describe)(financial)  Social/Family/Support Systems Patient Roles: Spouse Contact Information: wife: [name] [phone] Anticipated Caregiver: wife + wife's sister checks in Anticipated Caregiver's Contact Information: see above Ability/Limitations of Caregiver: supervision Caregiver Availability: 24/7 Discharge Plan  Discussed with Primary Caregiver: Yes Is Caregiver In Agreement with Plan?: Yes Does Caregiver/Family have Issues with Lodging/Transportation while Pt is in Rehab?: No  Goals/Additional Needs Patient/Family Goal for Rehab: PT/OT: Supervision; SLP: NA Expected length of stay: 5-8 days Cultural Considerations: NA Dietary Needs: carb modified; thin liquids; room service with assist. Calorie level medium 1600-2000.  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 Pt had ankle monitor cut off for MRI in ED. Please call Parole Officer [place] prior to DC from rehab for replacement of monitor [phone] Pt/Family Agrees to Admission and willing to participate: Yes Program Orientation Provided & Reviewed with Pt/Caregiver Including Roles  & Responsibilities: Yes(pt and hiw wife)  Barriers to Discharge: Lack of/limited family support, Behavior, Other (comments)(most likely cannot be placed in SNF)  Decrease burden of Care through IP rehab admission: NA  Possible need for SNF placement upon discharge: Not anticipated. Unlikely pt will be able to be placed in a SNF. Pt's will have 24/7 supervision from his wife and can have assist from family at times. Anticipated pt can return to Supervision level in a short period of time and can return home safely.   Patient Condition: I have reviewed medical records from [facility], spoken with CSW, and patient and spouse. I discussed via phone for inpatient rehabilitation assessment.  Patient will benefit from ongoing PT and OT, can actively participate in 3 hours of therapy a day 5 days of the week, and can make measurable gains during the admission.  Patient will also benefit from the coordinated team approach during an Inpatient Acute Rehabilitation admission.  The patient will receive intensive therapy as well as Rehabilitation physician, nursing, social worker, and care  management interventions.  Due to safety, skin/wound care, disease management, medication administration, pain management and patient education the patient requires 24 hour a day rehabilitation nursing.  The patient is  currently Min A with mobility and Min/Mod A for basic ADLs.  Discharge setting and therapy post discharge at home with home health is anticipated.  Patient has agreed to participate in the Acute Inpatient Rehabilitation Program and will admit 03/06/2019.  Preadmission Screen Completed By:  Raechel Ache, 03/06/2019 10:42 AM ______________________________________________________________________   Discussed status with Dr. Posey Pronto on 03/06/2019 at 10:38AM and received approval for admission today.  Admission Coordinator:  Raechel Ache, OT, time 10:38AM/Date 03/06/2019   Assessment/Plan: Diagnosis: Acute right lateral medullary infarct  1. Does the need for close, 24 hr/day Medical supervision in concert with the patient's rehab needs make it unreasonable for this patient to be served in a less intensive setting? Yes  2. Co-Morbidities requiring supervision/potential complications: type 2 DM, hypertension, hyperlipidemia, mentally challenged, and phenylketonuria 3. Due to safety, disease management and patient education, does the patient require 24 hr/day rehab nursing? Yes 4. Does the patient require coordinated care of a physician, rehab nurse, PT, OT, and SLP to address physical and functional deficits in the context of the above medical diagnosis(es)? Yes Addressing deficits in the following areas: balance, endurance, locomotion, strength, transferring, bathing, dressing, toileting and psychosocial support 5. Can the patient actively participate in an intensive therapy program of at least 3 hrs of therapy 5 days a week? Yes 6. The potential for patient to make measurable gains while on inpatient rehab is excellent 7. Anticipated functional outcomes upon discharge from inpatient rehab:  supervision PT, supervision OT, n/a SLP 8. Estimated rehab length of stay to reach the above functional goals is: 5-7 days. 9. Anticipated discharge destination: Home 10. Overall Rehab/Functional Prognosis: good   MD Signature: Delice Lesch, MD, ABPMR

## 2019-03-05 NOTE — Progress Notes (Signed)
Physical Therapy Treatment Patient Details Name: [name]. MRN: [id number] DOB: [birth year] Today's Date: 03/05/2019    History of Present Illness 58 y.o. male with a history of HTN, HLD and DM who reports being at baseline until coming out of the shower at 0100 this morning.  He was coming out of the shower when he developed sudden severe headache that he described as throbbing, sharp, generalized, 10 out of 10, associated with dizziness.  Patient reports that he felt like his R leg was weaker than the L and he was unable to walk straight without leaning to the right. Has been having headaches over the past 2-3 weeks without associated focal neurological symptoms. 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    PT Comments    Ready for session.  Bed mobility with supervision for safety.  He is able to mini-squat with min guard to bull up attends briefs he is wearing from home.  Pt is able to progress gait this am.  He walks around small nursing unit with seated rest on bench in hallway.  7' x 2 with min a x 1.  Overall  Improving gait but easily distracted by things in hallway causing LOB's while looking in other pt rooms etc.  Slow to respond at times.  Pt asking to walk further distances - down long hallway - than he is physically able at this time.  Shows poor awareness of limitations.  HR of note remained in 40's during gait and at rest.   Follow Up Recommendations  CIR     Equipment Recommendations  Rolling walker with 5" wheels    Recommendations for Other Services       Precautions / Restrictions Precautions Precautions: Fall Restrictions Weight Bearing Restrictions: No    Mobility  Bed Mobility Overal bed mobility: Needs Assistance Bed Mobility: Supine to Sit     Supine to sit: Min assist        Transfers Overall transfer level: Needs assistance Equipment used: Rolling walker (2 wheeled) Transfers: Sit to/from Stand               Ambulation/Gait Ambulation/Gait assistance: Min Web designer (Feet): 70 Feet Assistive device: Rolling walker (2 wheeled) Gait Pattern/deviations: Step-through pattern;Decreased step length - right;Decreased step length - left Gait velocity: decreased   General Gait Details: 69' x 2 with min a x 1. LOB when distracted with cues to attend to gait.   Stairs             Wheelchair Mobility    Modified Rankin (Stroke Patients Only)       Balance Overall balance assessment: Needs assistance Sitting-balance support: Feet supported;Bilateral upper extremity supported Sitting balance-Leahy Scale: Good     Standing balance support: Bilateral upper extremity supported Standing balance-Leahy Scale: Fair Standing balance comment: no R lean noted today                            Cognition Arousal/Alertness: Awake/alert Behavior During Therapy: WFL for tasks assessed/performed Overall Cognitive Status: History of cognitive impairments - at baseline                                 General Comments: still has lack of safety awareness and attention to task. needs cues to foucs on tasks at times.  LOB's occur more when he is distracted.  Exercises      General Comments        Pertinent Vitals/Pain Pain Assessment: No/denies pain    Home Living                      Prior Function            PT Goals (current goals can now be found in the care plan section) Progress towards PT goals: Progressing toward goals    Frequency    7X/week      PT Plan Current plan remains appropriate    Co-evaluation              AM-PAC PT "6 Clicks" Mobility   Outcome Measure  Help needed turning from your back to your side while in a flat bed without using bedrails?: None Help needed moving from lying on your back to sitting on the side of a flat bed without using bedrails?: A Little Help needed moving to and from a bed to  a chair (including a wheelchair)?: A Little Help needed standing up from a chair using your arms (e.g., wheelchair or bedside chair)?: A Little Help needed to walk in hospital room?: A Little Help needed climbing 3-5 steps with a railing? : A Lot 6 Click Score: 18    End of Session Equipment Utilized During Treatment: Gait belt Activity Tolerance: Patient tolerated treatment well Patient left: in chair;with call bell/phone within reach;with chair alarm set Nurse Communication: Mobility status;Other (comment)       Time: 1210-1228 PT Time Calculation (min) (ACUTE ONLY): 18 min  Charges:  $Gait Training: 8-22 mins                    Chesley Noon, PTA 03/05/19, 2:08 PM

## 2019-03-05 NOTE — Progress Notes (Addendum)
PROGRESS NOTE    Alex Metro.  TDV:[id number] DOB: Jun 01, 1960 DOA: 03/02/2019 PCP: [name], NP    Brief Narrative:  Alex Mccahill. is a 58 y.o. male with medical history significant of type 2 diabetes mellitus, hypertension, hyperlipidemia, mentally challenged, phenylketonuria, admitted for headache and weakness.  Patient reports that had a fall a few days ago and has been having headaches since.  He also reports dizziness.  He also feels weak mainly on the right side and tends to lean towards the right when he is walking. 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     Consultants:   neurology  Procedures: MRI/CTA  Antimicrobials:   none   Subjective: Reports nausea as well as constipation.  Passing gas. No vomiting.  Objective: Vitals:   03/05/19 0453 03/05/19 0829 03/05/19 1234 03/05/19 1551  BP: (!) 173/85 (!) 168/86 (!) 175/82 (!) 165/85  Pulse: (!) 44 (!) 42 (!) 45 (!) 49  Resp:  _0 Temp: 97.6 F (36.4 C) (!) 97.5 F (36.4 C) (!) 97.5 F (36.4 C) 97.6 F (36.4 C)  TempSrc: Oral Oral Oral Oral  SpO2: 99% 98% 99% 97%  Weight:      Height:        Intake/Output Summary (Last 24 hours) at 03/05/2019 1906 Last data filed at 03/05/2019 1843 Gross per 24 hour  Intake 480 ml  Output 500 ml  Net -20 ml   Filed Weights   03/02/19 0222 03/02/19 2252  Weight: 79.4 kg 79.9 kg    Examination:  General exam: Appears calm and comfortable , NAD Respiratory system: Clear to auscultation. Respiratory effort normal. Cardiovascular system: S1 & S2 heard, RRR. No JVD, murmurs, rubs, gallops or clicks.  Gastrointestinal system: Abdomen is nondistended, soft and nontender. No organomegaly or masses felt. Normal bowel sounds heard. Central nervous system: Alert and oriented.Nystagmus. RUE 4/5, LUE 5/5, RLE 4/5 LLE 5/5. No facial drop. Extremities: Mild b/l edema, no cyanosis Skin: warm, dry Psychiatry: Judgement and  insight appear normal. Mood & affect appropriate.     Data Reviewed: I have personally reviewed following labs and imaging studies  CBC: Recent Labs  Lab 03/02/19 0311 03/05/19 0339  WBC 9.2 6.5  NEUTROABS 7.1  --   HGB 14.4 14.7  HCT 40.5 44.1  MCV 86.7 91.3  PLT 165 062   Basic Metabolic Panel: Recent Labs  Lab 03/02/19 0311 03/05/19 0339  NA 136 141  K 4.0 3.7  CL 103 108  CO2 24 24  GLUCOSE 274* 217*  BUN 15 12  CREATININE 1.20 1.03  CALCIUM 9.1 8.9   GFR: Estimated Creatinine Clearance: 76.5 mL/min (by C-G formula based on SCr of 1.03 mg/dL). Liver Function Tests: Recent Labs  Lab 03/02/19 0311  AST 31  ALT 33  ALKPHOS 120  BILITOT 0.5  PROT 6.7  ALBUMIN 4.1   No results for input(s): LIPASE, AMYLASE in the last 168 hours. No results for input(s): AMMONIA in the last 168 hours. Coagulation Profile: Recent Labs  Lab 03/02/19 0311  INR 1.0   Cardiac Enzymes: No results for input(s): CKTOTAL, CKMB, CKMBINDEX, TROPONINI in the last 168 hours. BNP (last 3 results) No results for input(s): PROBNP in the last 8760 hours. HbA1C: Recent Labs    03/03/19 0447  HGBA1C 8.6*   CBG: Recent Labs  Lab 03/04/19 1727 03/04/19 2115 03/05/19 1037 03/05/19 1154 03/05/19 1644  GLUCAP 217* 213* 224* 206* 251*  Lipid Profile: Recent Labs    03/03/19 0447  CHOL 159  HDL 30*  LDLCALC 85  TRIG 222*  CHOLHDL 5.3   Thyroid Function Tests: No results for input(s): TSH, T4TOTAL, FREET4, T3FREE, THYROIDAB in the last 72 hours. Anemia Panel: No results for input(s): VITAMINB12, FOLATE, FERRITIN, TIBC, IRON, RETICCTPCT in the last 72 hours. Sepsis Labs: No results for input(s): PROCALCITON, LATICACIDVEN in the last 168 hours.  Recent Results (from the past 240 hour(s))  SARS CORONAVIRUS 2 (TAT 6-24 HRS) Nasopharyngeal Nasopharyngeal Swab     Status: None   Collection Time: 03/02/19  1:54 PM   Specimen: Nasopharyngeal Swab  Result Value Ref Range  Status   SARS Coronavirus 2 NEGATIVE NEGATIVE Final    Comment: (NOTE) SARS-CoV-2 target nucleic acids are NOT DETECTED. The SARS-CoV-2 RNA is generally detectable in upper and lower respiratory specimens during the acute phase of infection. Negative results do not preclude SARS-CoV-2 infection, do not rule out co-infections with other pathogens, and should not be used as the sole basis for treatment or other patient management decisions. Negative results must be combined with clinical observations, patient history, and epidemiological information. The expected result is Negative. Fact Sheet for Patients: SugarRoll.be Fact Sheet for Healthcare Providers: https://www.woods-mathews.com/ This test is not yet approved or cleared by the [place] FDA and  has been authorized for detection and/or diagnosis of SARS-CoV-2 by FDA under an Emergency Use Authorization (EUA). This EUA will remain  in effect (meaning this test can be used) for the duration of the COVID-19 declaration under Section 56 4(b)(1) of the Act, 21 U.S.C. section 360bbb-3(b)(1), unless the authorization is terminated or revoked sooner. Performed at [facility], [place] [address] [place], [place] [address]          Radiology Studies: DG Abd Portable 1V  Result Date: 03/05/2019 CLINICAL DATA:  Hypertension. Diabetes. Severe headache. Abdominal pain. EXAM: PORTABLE ABDOMEN - 1 VIEW COMPARISON:  Ultrasound 09/01/2018. FINDINGS: Soft tissue structures are unremarkable. Stool noted throughout the colon. No bowel distention or free air. Degenerative changes lumbar spine and both hips. IMPRESSION: No acute abnormality identified. Electronically Signed   By: [place]   On: 03/05/2019 11:45        Scheduled Meds: . aspirin EC  81 mg Oral Daily  . atorvastatin  40 mg Oral q1800  . budesonide (PULMICORT) nebulizer solution  0.25 mg Nebulization BID  . clopidogrel   75 mg Oral Daily  . enoxaparin (LOVENOX) injection  40 mg Subcutaneous Q24H  . fenofibrate  54 mg Oral Daily  . gabapentin  300 mg Oral BID  . insulin aspart  0-9 Units Subcutaneous TID WC  . lactulose  20 g Oral BID  . montelukast  10 mg Oral QHS  . pantoprazole  40 mg Oral Daily  . 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  No significant stenosis (please see full report Echo within normal limit Neurology following-recommend Dual antiplatelet therapy with ASA 5m and Plavix 763mfor three weeks with change toASA 8144maily LDL target less than 70 PT/OT-OT recommends SNF/CIR  Type 2 diabetes mellitus -sliding scale insulin H A1c on 03/03/2019 was 8.6  Hyperlipidemia LDL 85 , triglyceride 222 Continue with  fenofibrate and simvastatin aggressive therapy  Essential hypertension -allow permissive hypertension, keeping around 160 today, with slowly improving .    Constipation. Adding bowel regimen. X-ray negative for any obstruction   DVT prophylaxis: Lovenox  Code Status: Full code  Family Communication: none at bedside Disposition Plan: CIR likely tomorrow       LOS: 3 days   Time spent: 25 minutes with more than 50% on COC    Berle Mull, MD [facility]  If 7PM-7AM, please contact night-coverage [email or web address] Password [email or web address] 03/05/2019, 7:06 PM

## 2019-03-05 NOTE — Care Management Important Message (Signed)
Important Message  Patient Details  Name: [name]. MRN: [id number] Date of Birth: [birth year]   Medicare Important Message Given:  Yes     Dannette Barbara 03/05/2019, 2:15 PM

## 2019-03-06 ENCOUNTER — Inpatient Hospital Stay (HOSPITAL_COMMUNITY)
Admission: RE | Admit: 2019-03-06 | Discharge: 2019-03-13 | DRG: 057 | Disposition: A | Discharge status: Home Health | Payer: Medicare Other | Source: Other Acute Inpatient Hospital | Attending: Physical Medicine & Rehabilitation | Admitting: Physical Medicine & Rehabilitation

## 2019-03-06 ENCOUNTER — Encounter (HOSPITAL_COMMUNITY): Payer: Self-pay | Admitting: Physical Medicine & Rehabilitation

## 2019-03-06 ENCOUNTER — Other Ambulatory Visit: Payer: Self-pay

## 2019-03-06 DIAGNOSIS — F79 Unspecified intellectual disabilities: Secondary | ICD-10-CM | POA: Diagnosis present

## 2019-03-06 DIAGNOSIS — R112 Nausea with vomiting, unspecified: Secondary | ICD-10-CM

## 2019-03-06 DIAGNOSIS — R42 Dizziness and giddiness: Secondary | ICD-10-CM | POA: Diagnosis present

## 2019-03-06 DIAGNOSIS — E46 Unspecified protein-calorie malnutrition: Secondary | ICD-10-CM

## 2019-03-06 DIAGNOSIS — E701 Other hyperphenylalaninemias: Secondary | ICD-10-CM | POA: Diagnosis present

## 2019-03-06 DIAGNOSIS — R062 Wheezing: Secondary | ICD-10-CM

## 2019-03-06 DIAGNOSIS — H532 Diplopia: Secondary | ICD-10-CM

## 2019-03-06 DIAGNOSIS — E1169 Type 2 diabetes mellitus with other specified complication: Secondary | ICD-10-CM | POA: Diagnosis not present

## 2019-03-06 DIAGNOSIS — I6389 Other cerebral infarction: Secondary | ICD-10-CM

## 2019-03-06 DIAGNOSIS — I639 Cerebral infarction, unspecified: Secondary | ICD-10-CM | POA: Diagnosis not present

## 2019-03-06 DIAGNOSIS — E785 Hyperlipidemia, unspecified: Secondary | ICD-10-CM

## 2019-03-06 DIAGNOSIS — E1165 Type 2 diabetes mellitus with hyperglycemia: Secondary | ICD-10-CM

## 2019-03-06 DIAGNOSIS — J449 Chronic obstructive pulmonary disease, unspecified: Secondary | ICD-10-CM | POA: Diagnosis present

## 2019-03-06 DIAGNOSIS — E8809 Other disorders of plasma-protein metabolism, not elsewhere classified: Secondary | ICD-10-CM

## 2019-03-06 DIAGNOSIS — Z8249 Family history of ischemic heart disease and other diseases of the circulatory system: Secondary | ICD-10-CM

## 2019-03-06 DIAGNOSIS — R066 Hiccough: Secondary | ICD-10-CM

## 2019-03-06 DIAGNOSIS — E700 Classical phenylketonuria: Secondary | ICD-10-CM

## 2019-03-06 DIAGNOSIS — Z833 Family history of diabetes mellitus: Secondary | ICD-10-CM

## 2019-03-06 DIAGNOSIS — I69398 Other sequelae of cerebral infarction: Secondary | ICD-10-CM

## 2019-03-06 DIAGNOSIS — R4587 Impulsiveness: Secondary | ICD-10-CM | POA: Diagnosis present

## 2019-03-06 DIAGNOSIS — K5900 Constipation, unspecified: Secondary | ICD-10-CM | POA: Diagnosis present

## 2019-03-06 DIAGNOSIS — K5901 Slow transit constipation: Secondary | ICD-10-CM | POA: Diagnosis not present

## 2019-03-06 DIAGNOSIS — Z8673 Personal history of transient ischemic attack (TIA), and cerebral infarction without residual deficits: Secondary | ICD-10-CM

## 2019-03-06 DIAGNOSIS — I69334 Monoplegia of upper limb following cerebral infarction affecting left non-dominant side: Secondary | ICD-10-CM | POA: Diagnosis not present

## 2019-03-06 DIAGNOSIS — Z7982 Long term (current) use of aspirin: Secondary | ICD-10-CM

## 2019-03-06 DIAGNOSIS — I10 Essential (primary) hypertension: Secondary | ICD-10-CM | POA: Diagnosis present

## 2019-03-06 DIAGNOSIS — J4530 Mild persistent asthma, uncomplicated: Secondary | ICD-10-CM

## 2019-03-06 DIAGNOSIS — I69354 Hemiplegia and hemiparesis following cerebral infarction affecting left non-dominant side: Secondary | ICD-10-CM | POA: Diagnosis present

## 2019-03-06 DIAGNOSIS — I6300 Cerebral infarction due to thrombosis of unspecified precerebral artery: Secondary | ICD-10-CM

## 2019-03-06 DIAGNOSIS — E860 Dehydration: Secondary | ICD-10-CM | POA: Diagnosis present

## 2019-03-06 LAB — CBC WITH DIFFERENTIAL/PLATELET
Abs Immature Granulocytes: 0.04 10*3/uL (ref 0.00–0.07)
Basophils Absolute: 0 10*3/uL (ref 0.0–0.1)
Basophils Relative: 0 %
Eosinophils Absolute: 0.2 10*3/uL (ref 0.0–0.5)
Eosinophils Relative: 2 %
HCT: 44.8 % (ref 39.0–52.0)
Hemoglobin: 15.5 g/dL (ref 13.0–17.0)
Immature Granulocytes: 1 %
Lymphocytes Relative: 23 %
Lymphs Abs: 1.7 10*3/uL (ref 0.7–4.0)
MCH: 30.2 pg (ref 26.0–34.0)
MCHC: 34.6 g/dL (ref 30.0–36.0)
MCV: 87.2 fL (ref 80.0–100.0)
Monocytes Absolute: 0.5 10*3/uL (ref 0.1–1.0)
Monocytes Relative: 7 %
Neutro Abs: 4.9 10*3/uL (ref 1.7–7.7)
Neutrophils Relative %: 67 %
Platelets: 161 10*3/uL (ref 150–400)
RBC: 5.14 MIL/uL (ref 4.22–5.81)
RDW: 12.7 % (ref 11.5–15.5)
WBC: 7.3 10*3/uL (ref 4.0–10.5)
nRBC: 0 % (ref 0.0–0.2)

## 2019-03-06 LAB — GLUCOSE, CAPILLARY
Glucose-Capillary: 195 mg/dL — ABNORMAL HIGH (ref 70–99)
Glucose-Capillary: 176 mg/dL — ABNORMAL HIGH (ref 70–99)
Glucose-Capillary: 216 mg/dL — ABNORMAL HIGH (ref 70–99)
Glucose-Capillary: 263 mg/dL — ABNORMAL HIGH (ref 70–99)
Glucose-Capillary: 213 mg/dL — ABNORMAL HIGH (ref 70–99)

## 2019-03-06 LAB — COMPREHENSIVE METABOLIC PANEL
ALT: 39 U/L (ref 0–44)
AST: 27 U/L (ref 15–41)
Albumin: 3.7 g/dL (ref 3.5–5.0)
Alkaline Phosphatase: 95 U/L (ref 38–126)
Anion gap: 8 (ref 5–15)
BUN: 12 mg/dL (ref 6–20)
CO2: 24 mmol/L (ref 22–32)
Calcium: 8.8 mg/dL — ABNORMAL LOW (ref 8.9–10.3)
Chloride: 107 mmol/L (ref 98–111)
Creatinine, Ser: 1.08 mg/dL (ref 0.61–1.24)
GFR calc Af Amer: >60 mL/min (ref >60)
GFR calc non Af Amer: >60 mL/min (ref >60)
Glucose, Bld: 196 mg/dL — ABNORMAL HIGH (ref 70–99)
Potassium: 4 mmol/L (ref 3.5–5.1)
Sodium: 139 mmol/L (ref 135–145)
Total Bilirubin: 0.8 mg/dL (ref 0.3–1.2)
Total Protein: 6.5 g/dL (ref 6.5–8.1)

## 2019-03-06 LAB — MAGNESIUM: Magnesium: 2 mg/dL (ref 1.7–2.4)

## 2019-03-06 MED ORDER — MECLIZINE HCL 25 MG PO TABS
12.5000 mg | ORAL_TABLET | Freq: Three times a day (TID) | ORAL | Status: DC
  Administered 2019-03-06 – 2019-03-13 (×20): 12.5 mg via ORAL
  Filled 2019-03-06 (×20): qty 1

## 2019-03-06 MED ORDER — ASPIRIN 81 MG PO TBEC: 81.0000 mg | DELAYED_RELEASE_TABLET | Freq: Every day | ORAL | 0 refills | Status: DC

## 2019-03-06 MED ORDER — DIPHENHYDRAMINE HCL 12.5 MG/5ML PO ELIX: 12.5000 mg | ORAL_SOLUTION | Freq: Four times a day (QID) | ORAL | Status: DC | PRN

## 2019-03-06 MED ORDER — ONDANSETRON HCL 4 MG/2ML IJ SOLN
4.0000 mg | Freq: Four times a day (QID) | INTRAMUSCULAR | Status: DC | PRN
  Administered 2019-03-06 – 2019-03-08 (×6): 4 mg via INTRAVENOUS
  Filled 2019-03-06 (×6): qty 2

## 2019-03-06 MED ORDER — MONTELUKAST SODIUM 10 MG PO TABS
10.0000 mg | ORAL_TABLET | Freq: Every day | ORAL | Status: DC
  Administered 2019-03-06 – 2019-03-12 (×7): 10 mg via ORAL
  Filled 2019-03-06 (×7): qty 1

## 2019-03-06 MED ORDER — FLEET ENEMA 7-19 GM/118ML RE ENEM: 1.0000 | ENEMA | Freq: Once | RECTAL | Status: DC | PRN

## 2019-03-06 MED ORDER — SENNOSIDES-DOCUSATE SODIUM 8.6-50 MG PO TABS: 2.0000 | ORAL_TABLET | Freq: Every day | ORAL | 1 refills | Status: DC | PRN

## 2019-03-06 MED ORDER — MECLIZINE HCL 12.5 MG PO TABS: 12.5000 mg | ORAL_TABLET | Freq: Three times a day (TID) | ORAL | 0 refills | Status: DC

## 2019-03-06 MED ORDER — SENNOSIDES-DOCUSATE SODIUM 8.6-50 MG PO TABS
2.0000 | ORAL_TABLET | Freq: Every day | ORAL | Status: DC
  Administered 2019-03-06 – 2019-03-12 (×6): 2 via ORAL
  Filled 2019-03-06 (×7): qty 2

## 2019-03-06 MED ORDER — PROCHLORPERAZINE EDISYLATE 10 MG/2ML IJ SOLN: 5.0000 mg | Freq: Four times a day (QID) | INTRAMUSCULAR | Status: DC | PRN

## 2019-03-06 MED ORDER — ACETAMINOPHEN 325 MG PO TABS
325.0000 mg | ORAL_TABLET | ORAL | Status: DC | PRN
  Administered 2019-03-06 – 2019-03-11 (×8): 650 mg via ORAL
  Filled 2019-03-06 (×9): qty 2

## 2019-03-06 MED ORDER — ATORVASTATIN CALCIUM 40 MG PO TABS
40.0000 mg | ORAL_TABLET | Freq: Every day | ORAL | Status: DC
  Administered 2019-03-06 – 2019-03-12 (×7): 40 mg via ORAL
  Filled 2019-03-06 (×7): qty 1

## 2019-03-06 MED ORDER — GABAPENTIN 300 MG PO CAPS
300.0000 mg | ORAL_CAPSULE | Freq: Two times a day (BID) | ORAL | Status: DC
  Administered 2019-03-06 – 2019-03-13 (×14): 300 mg via ORAL
  Filled 2019-03-06 (×14): qty 1

## 2019-03-06 MED ORDER — CLOPIDOGREL BISULFATE 75 MG PO TABS: 75.0000 mg | ORAL_TABLET | Freq: Every day | ORAL | 0 refills | Status: DC

## 2019-03-06 MED ORDER — ASPIRIN EC 81 MG PO TBEC
81.0000 mg | DELAYED_RELEASE_TABLET | Freq: Every day | ORAL | Status: DC
  Administered 2019-03-07 – 2019-03-13 (×7): 81 mg via ORAL
  Filled 2019-03-06 (×7): qty 1

## 2019-03-06 MED ORDER — INSULIN ASPART 100 UNIT/ML ~~LOC~~ SOLN
0.0000 [IU] | Freq: Every day | SUBCUTANEOUS | Status: DC
  Administered 2019-03-06 – 2019-03-07 (×2): 2 [IU] via SUBCUTANEOUS
  Administered 2019-03-08: 3 [IU] via SUBCUTANEOUS

## 2019-03-06 MED ORDER — BUDESONIDE 0.25 MG/2ML IN SUSP
0.2500 mg | Freq: Two times a day (BID) | RESPIRATORY_TRACT | Status: DC
  Administered 2019-03-06 – 2019-03-12 (×11): 0.25 mg via RESPIRATORY_TRACT
  Filled 2019-03-06 (×14): qty 2

## 2019-03-06 MED ORDER — LACTULOSE 10 GM/15ML PO SOLN: 20.0000 g | Freq: Two times a day (BID) | ORAL | Status: DC

## 2019-03-06 MED ORDER — ALUM & MAG HYDROXIDE-SIMETH 200-200-20 MG/5ML PO SUSP: 30.0000 mL | ORAL | Status: DC | PRN

## 2019-03-06 MED ORDER — FENOFIBRATE 54 MG PO TABS
54.0000 mg | ORAL_TABLET | Freq: Every day | ORAL | Status: DC
  Administered 2019-03-07 – 2019-03-13 (×7): 54 mg via ORAL
  Filled 2019-03-06 (×7): qty 1

## 2019-03-06 MED ORDER — INSULIN ASPART 100 UNIT/ML ~~LOC~~ SOLN
0.0000 [IU] | Freq: Three times a day (TID) | SUBCUTANEOUS | Status: DC
  Administered 2019-03-06: 19:00:00 5 [IU] via SUBCUTANEOUS
  Administered 2019-03-07: 1 [IU] via SUBCUTANEOUS
  Administered 2019-03-07: 2 [IU] via SUBCUTANEOUS
  Administered 2019-03-07 – 2019-03-08 (×2): 1 [IU] via SUBCUTANEOUS
  Administered 2019-03-08: 12:00:00 2 [IU] via SUBCUTANEOUS
  Administered 2019-03-08: 1 [IU] via SUBCUTANEOUS
  Administered 2019-03-09 (×2): 2 [IU] via SUBCUTANEOUS
  Administered 2019-03-09 – 2019-03-10 (×2): 1 [IU] via SUBCUTANEOUS
  Administered 2019-03-10: 09:00:00 2 [IU] via SUBCUTANEOUS
  Administered 2019-03-10 – 2019-03-11 (×2): 1 [IU] via SUBCUTANEOUS
  Administered 2019-03-11 (×2): 2 [IU] via SUBCUTANEOUS
  Administered 2019-03-12 (×3): 1 [IU] via SUBCUTANEOUS

## 2019-03-06 MED ORDER — TRAZODONE HCL 50 MG PO TABS
25.0000 mg | ORAL_TABLET | Freq: Every evening | ORAL | Status: DC | PRN
  Filled 2019-03-06 (×3): qty 1

## 2019-03-06 MED ORDER — CLOPIDOGREL BISULFATE 75 MG PO TABS
75.0000 mg | ORAL_TABLET | Freq: Every day | ORAL | Status: DC
  Administered 2019-03-07 – 2019-03-13 (×7): 75 mg via ORAL
  Filled 2019-03-06 (×7): qty 1

## 2019-03-06 MED ORDER — PROCHLORPERAZINE MALEATE 5 MG PO TABS
5.0000 mg | ORAL_TABLET | Freq: Four times a day (QID) | ORAL | Status: DC | PRN
  Administered 2019-03-09: 5 mg via ORAL
  Filled 2019-03-06: qty 1
  Filled 2019-03-06: qty 2

## 2019-03-06 MED ORDER — ATORVASTATIN CALCIUM 40 MG PO TABS: 40.0000 mg | ORAL_TABLET | Freq: Every day | ORAL | 0 refills | Status: DC

## 2019-03-06 MED ORDER — POLYETHYLENE GLYCOL 3350 17 G PO PACK: 17.0000 g | PACK | Freq: Every day | ORAL | 0 refills | Status: DC

## 2019-03-06 MED ORDER — BISACODYL 10 MG RE SUPP: 10.0000 mg | Freq: Every day | RECTAL | Status: DC | PRN

## 2019-03-06 MED ORDER — MECLIZINE HCL 12.5 MG PO TABS
12.5000 mg | ORAL_TABLET | Freq: Three times a day (TID) | ORAL | Status: DC
  Administered 2019-03-06: 12.5 mg via ORAL
  Filled 2019-03-06 (×3): qty 1

## 2019-03-06 MED ORDER — PROCHLORPERAZINE 25 MG RE SUPP: 12.5000 mg | Freq: Four times a day (QID) | RECTAL | Status: DC | PRN

## 2019-03-06 MED ORDER — ENOXAPARIN SODIUM 40 MG/0.4ML ~~LOC~~ SOLN
40.0000 mg | SUBCUTANEOUS | Status: DC
  Administered 2019-03-06 – 2019-03-12 (×7): 40 mg via SUBCUTANEOUS
  Filled 2019-03-06 (×7): qty 0.4

## 2019-03-06 MED ORDER — CANAGLIFLOZIN 100 MG PO TABS
100.0000 mg | ORAL_TABLET | Freq: Every day | ORAL | Status: DC
  Administered 2019-03-07 – 2019-03-13 (×7): 100 mg via ORAL
  Filled 2019-03-06 (×8): qty 1

## 2019-03-06 MED ORDER — POLYETHYLENE GLYCOL 3350 17 G PO PACK: 17.0000 g | PACK | Freq: Every day | ORAL | Status: DC | PRN

## 2019-03-06 MED ORDER — PANTOPRAZOLE SODIUM 40 MG PO TBEC
40.0000 mg | DELAYED_RELEASE_TABLET | Freq: Every day | ORAL | Status: DC
  Administered 2019-03-07 – 2019-03-13 (×7): 40 mg via ORAL
  Filled 2019-03-06 (×8): qty 1

## 2019-03-06 MED ORDER — GUAIFENESIN-DM 100-10 MG/5ML PO SYRP: 5.0000 mL | ORAL_SOLUTION | Freq: Four times a day (QID) | ORAL | Status: DC | PRN

## 2019-03-06 NOTE — H&P (Signed)
Physical Medicine and Rehabilitation Admission H&P    Chief Complaint  Patient presents with  . Functional decline due to stroke.     HPI:  [name] is 58 year old male with history of HTN, T2DM, hyperlipidemia, PKU, mentally challenged who was admitted to [facility] on 03/02/2019 with fall 3 weeks ago with subsequent headache, dizziness, weakness, right brain with ambulation.  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  CTA head/neck showed acute infarct in right  [place] and small R-VA ending in PICA question congenital variation v/s acute distal R-VA occlusion.  Echocardiogram showed ejection fraction of 60-65% with trivial MVR.  Carotid dopplers showed patent VA and no significant ICA stenosis . Dr. Doy Mince felt that stroke was due to small vessel disease and recommended ASA/Plavix X 3 weeks followed by ASA 81 mg/daily.  Therapy evaluation revealed decreased sensation LUE with dizziness, tangential conversation with decreased safety awareness. CIR recommended due to functional decline.  Please see preadmission assessment from earlier today as well.   Review of Systems  Constitutional: Negative for chills and fever.  HENT: Negative for hearing loss and tinnitus.   Eyes: Positive for double vision.  Respiratory: Negative for cough and hemoptysis.   Cardiovascular: Negative for chest pain and palpitations.  Gastrointestinal: Positive for nausea and vomiting. Negative for constipation.  Genitourinary: Negative for dysuria.  Musculoskeletal: Positive for joint pain (left lateral knee pain). Negative for myalgias.  Skin: Negative for itching and rash.  Neurological: Positive for dizziness, sensory change, focal weakness and headaches.  Psychiatric/Behavioral: Positive for memory loss. The patient does not have insomnia.       Past Medical History:  Diagnosis Date  . Arrhythmia   . Asthma   . Cyst of kidney, acquired   . Diabetes mellitus without complication ([place])  7829   type 2  . GERD (gastroesophageal reflux disease)   . History of chicken pox   . History of measles as a child   . History of PKU   . Hyperlipidemia   . Hypertension   . IBS (irritable bowel syndrome)   . Irregular heart beat   . Mentally challenged   . Pancreatitis     Past Surgical History:  Procedure Laterality Date  . Cardiac Catherization     [facility]  . CARDIAC CATHETERIZATION     [facility]  . COLONOSCOPY    . ESOPHAGOGASTRODUODENOSCOPY (EGD) WITH PROPOFOL N/A 12/27/2016   Procedure: ESOPHAGOGASTRODUODENOSCOPY (EGD) WITH PROPOFOL;  Surgeon: Lin Landsman, MD;  Location: [place];  Service: Gastroenterology;  Laterality: N/A;  . HEMORRHOID SURGERY    . Ligament Removal Left    of left thumb: dr. Cleda Mccreedy  . ligament removal  of left thumb     Dr. Cleda Mccreedy  . NM GATED MYOCARDIAL STUDY (ARMX HX)  06/23/2014   Paraschos. Normal    Family History  Problem Relation Age of Onset  . Cancer Mother        throat  . Diabetes Father   . Heart disease Father   . Cancer Maternal Grandmother   . Cancer Maternal Grandfather        pancreatic  . Cancer Paternal Grandfather     Social History:  Married. Independent PTA. Disabled--wife handles fiances and helps with meds. He reports that he has never smoked. He has never used smokeless tobacco. He reports that he does not drink alcohol or use drugs.    Allergies  Allergen Reactions  . Bee Venom Hives  All kinds of bees  . Other     Certain powders  . Sulfa Antibiotics     Medications Prior to Admission  Medication Sig Dispense Refill  . aspirin EC 81 MG EC tablet Take 1 tablet (81 mg total) by mouth daily. 180 tablet 0  . atorvastatin (LIPITOR) 40 MG tablet Take 1 tablet (40 mg total) by mouth daily at 6 PM. 30 tablet 0  . canagliflozin (INVOKANA) 300 MG TABS tablet Take 1 tablet (300 mg total) by mouth daily before breakfast. 30 tablet 11  . Cholecalciferol (VITAMIN D3) 1.25 MG (50000 UT) CAPS Take 50,000 Units  by mouth once a week. 8 capsule 5  . clobetasol cream (TEMOVATE) 3.73 % Apply 1 application topically 2 (two) times daily. (Patient not taking: Reported on 12/02/2018) 30 g 0  . clopidogrel (PLAVIX) 75 MG tablet Take 1 tablet (75 mg total) by mouth daily. 30 tablet 0  . EPINEPHrine 0.3 mg/0.3 mL IJ SOAJ injection Inject 0.3 mLs (0.3 mg total) into the muscle once as needed for up to 1 dose. (Patient not taking: Reported on 12/02/2018) 1 Device 0  . fenofibrate (TRICOR) 48 MG tablet TAKE 1 TABLET BY MOUTH ONCE DAILY (Patient taking differently: Take 48 mg by mouth daily. ) 30 tablet 1  . fluticasone (FLOVENT HFA) 110 MCG/ACT inhaler Inhale 1 puff into the lungs 2 (two) times daily. (Patient taking differently: Inhale 1 puff into the lungs daily. ) 1 Inhaler 12  . gabapentin (NEURONTIN) 300 MG capsule TAKE 1 CAPSULE BY MOUTH AT BEDTIME FOR ONE WEEK  THEN TAKE ONE CAPSULE TWICE DAILY (Patient taking differently: Take 300 mg by mouth 2 (two) times daily. ) 60 capsule 5  . lisinopril (ZESTRIL) 20 MG tablet TAKE 1 TABLET BY MOUTH ONCE DAILY (Patient taking differently: Take 20 mg by mouth daily. ) 90 tablet 4  . loratadine (CLARITIN) 10 MG tablet Take 1 tablet (10 mg total) by mouth daily. (Patient not taking: Reported on 12/02/2018) 30 tablet 11  . meclizine (ANTIVERT) 12.5 MG tablet Take 1 tablet (12.5 mg total) by mouth 3 (three) times daily. 30 tablet 0  . meloxicam (MOBIC) 15 MG tablet TAKE 1 TABLET BY MOUTH ONCE DAILY (Patient taking differently: Take 15 mg by mouth daily. ) 30 tablet 0  . montelukast (SINGULAIR) 10 MG tablet TAKE 1 TABLET BY MOUTH ONCE EVERY EVENING AT BEDTIME. (Patient taking differently: Take 10 mg by mouth at bedtime. ) 30 tablet 11  . nitroGLYCERIN (NITROSTAT) 0.4 MG SL tablet Place 1 tablet (0.4 mg total) under the tongue every 5 (five) minutes as needed for chest pain. (Patient not taking: Reported on 12/02/2018) 50 tablet 3  . omeprazole (PRILOSEC) 20 MG capsule TAKE 1 CAPSULE BY  MOUTH ONCE DAILY (Patient taking differently: Take 20 mg by mouth daily. ) 30 capsule 12  . OneTouch Delica Lancets 42A MISC TEST ONCE A DAY. 100 each 3  . ONETOUCH DELICA LANCETS FINE MISC TEST once daily 100 each 3  . ONETOUCH ULTRA test strip TEST ONCE DAILY 100 each 4  . polyethylene glycol (MIRALAX) 17 g packet Take 17 g by mouth daily. 14 each 0  . PROAIR HFA 108 (90 Base) MCG/ACT inhaler INHALE 2 PUFFS BY MOUTH EVERY 6 HOURS IF NEEDED FOR COUGH OR WHEEZING. (Patient not taking: Reported on 12/02/2018) 8.5 g 3  . senna-docusate (SENOKOT-S) 8.6-50 MG tablet Take 2 tablets by mouth daily as needed for mild constipation. 30 tablet 1  Drug Regimen Review  Drug regimen was reviewed and remains appropriate with no significant issues identified  Home: Home Living Family/patient expects to be discharged to:: Private residence Living Arrangements: Spouse/significant other Available Help at Discharge: Family Type of Home: Mobile home Home Access: Ramped entrance Home Layout: One level Bathroom Shower/Tub: Chiropodist: Standard Home Equipment: Hand held shower head(reports his walker is broken)   Functional History: Prior Function Level of Independence: Independent Comments: Pt reports indep with ADL, mobility, and is primary caregiver for spouse for ADL, aide assists pt's spouse with meal prep/cleaning; 1 fall just prior to this admission  Functional Status:  Mobility: Bed Mobility Overal bed mobility: Needs Assistance Bed Mobility: Supine to Sit Supine to sit: Min guard General bed mobility comments: improved technique with no impulsiveness noted. Once seated, improved static posture; upright Transfers Overall transfer level: Needs assistance Equipment used: Rolling walker (2 wheeled) Transfers: Sit to/from Stand Sit to Stand: Min assist General transfer comment: post leaning noted. Able to self correct with cues. Ambulation/Gait Ambulation/Gait  assistance: Min assist Gait Distance (Feet): 100 Feet Assistive device: Rolling walker (2 wheeled) Gait Pattern/deviations: Step-through pattern, Decreased step length - right, Decreased step length - left General Gait Details: needs cues for attention to task as he gets easily distracted. Several LOB with R knee buckling needing mod assist to recover.Slow gait speed and fatigues with increased distance, doesn't require seated rest break this date. Gait velocity: decreased    ADL: ADL Overall ADL's : Needs assistance/impaired Toilet Transfer: BSC, Moderate assistance, Set up, RW Toilet Transfer Details (indicate cue type and reason): Pt needed to use the [facility] and was impulsive getting up and sitting at EOB and finding midline and slowing down movements to be safe before standing with mod assist with max cues due to safety and problem solving with motor planning.  He also has decreased vision in B eyes and keeps alternating opening and closing his eyes due to visual changes.  Numbness in R hand increased as he was pushing on BSC to help with stand and leaning to R to use L hand to wipe and needed mod assist for thoroughness of clean up and mod cues for safety again. General ADL Comments: fxl mobility deferred 2/2 pt declining (headache), Max A for LB ADL, Min A UB ADL  Cognition: Cognition Overall Cognitive Status: History of cognitive impairments - at baseline Orientation Level: Oriented X4 Cognition Arousal/Alertness: Awake/alert Behavior During Therapy: WFL for tasks assessed/performed Overall Cognitive Status: History of cognitive impairments - at baseline General Comments: lack of safety awareness and attention to task.    Blood pressure (!) 163/81, pulse (!) 54, temperature 98.2 F (36.8 C), resp. rate 17, height 5' 4" (1.626 m), weight 79.9 kg, SpO2 100 %. Physical Exam  Nursing note and vitals reviewed. Constitutional: He is oriented to person, place, and time. He appears  well-developed. No distress.  Obese  HENT:  Head: Normocephalic and atraumatic.  Edentulous.   Eyes: Pupils are equal, round, and reactive to light. Right eye exhibits no discharge. Left eye exhibits no discharge.  Neck: No tracheal deviation present. No thyromegaly present.  Respiratory: Effort normal. No stridor. No respiratory distress.  GI: He exhibits no distension.  Musculoskeletal:     Comments: No edema or tenderness in extremities  Neurological: He is alert and oriented to person, place, and time.  Tends to keep right eye closed due to diplopia.  Sensory deficits from right face down chest to right leg.  Ataxia with right finger to nose.  Impulsive but able to follow one step commands without difficulty.  Motor: RUE/RLE: 5-/5 proximal distal RUE/LE: 5/5 proximal distal  Skin: Skin is warm and dry. He is not diaphoretic.  Psychiatric: He has a normal mood and affect.  Slightly confused    Results for orders placed or performed during the hospital encounter of 03/02/19 (from the past 48 hour(s))  Glucose, capillary     Status: Abnormal   Collection Time: 03/04/19  5:27 PM  Result Value Ref Range   Glucose-Capillary 217 (H) 70 - 99 mg/dL   Comment 1 Notify RN    Comment 2 Document in Chart   Glucose, capillary     Status: Abnormal   Collection Time: 03/04/19  9:15 PM  Result Value Ref Range   Glucose-Capillary 213 (H) 70 - 99 mg/dL  AM Labs cbc     Status: None   Collection Time: 03/05/19  3:39 AM  Result Value Ref Range   WBC 6.5 4.0 - 10.5 K/uL   RBC 4.83 4.22 - 5.81 MIL/uL   Hemoglobin 14.7 13.0 - 17.0 g/dL   HCT 44.1 39.0 - 52.0 %   MCV 91.3 80.0 - 100.0 fL   MCH 30.4 26.0 - 34.0 pg   MCHC 33.3 30.0 - 36.0 g/dL   RDW 12.4 11.5 - 15.5 %   Platelets 159 150 - 400 K/uL   nRBC 0.0 0.0 - 0.2 %    Comment: Performed at [facility], [address] [place], [place] [address]  AM Labs bmp     Status: Abnormal   Collection Time: 03/05/19  3:39 AM  Result  Value Ref Range   Sodium 141 135 - 145 mmol/L   Potassium 3.7 3.5 - 5.1 mmol/L   Chloride 108 98 - 111 mmol/L   CO2 24 22 - 32 mmol/L   Glucose, Bld 217 (H) 70 - 99 mg/dL   BUN 12 6 - 20 mg/dL   Creatinine, Ser 1.03 0.61 - 1.24 mg/dL   Calcium 8.9 8.9 - 10.3 mg/dL   GFR calc non Af Amer >60 >60 mL/min   GFR calc Af Amer >60 >60 mL/min   Anion gap 9 5 - 15    Comment: Performed at [facility], [address] [place], [place] [address]  Glucose, capillary     Status: Abnormal   Collection Time: 03/05/19 10:37 AM  Result Value Ref Range   Glucose-Capillary 224 (H) 70 - 99 mg/dL  Glucose, capillary     Status: Abnormal   Collection Time: 03/05/19 11:54 AM  Result Value Ref Range   Glucose-Capillary 206 (H) 70 - 99 mg/dL  Glucose, capillary     Status: Abnormal   Collection Time: 03/05/19  4:44 PM  Result Value Ref Range   Glucose-Capillary 251 (H) 70 - 99 mg/dL  Glucose, capillary     Status: Abnormal   Collection Time: 03/05/19  9:03 PM  Result Value Ref Range   Glucose-Capillary 195 (H) 70 - 99 mg/dL  Glucose, capillary     Status: Abnormal   Collection Time: 03/06/19  8:03 AM  Result Value Ref Range   Glucose-Capillary 176 (H) 70 - 99 mg/dL  CBC with Differential/Platelet     Status: None   Collection Time: 03/06/19  9:00 AM  Result Value Ref Range   WBC 7.3 4.0 - 10.5 K/uL   RBC 5.14 4.22 - 5.81 MIL/uL   Hemoglobin 15.5 13.0 - 17.0 g/dL  HCT 44.8 39.0 - 52.0 %   MCV 87.2 80.0 - 100.0 fL   MCH 30.2 26.0 - 34.0 pg   MCHC 34.6 30.0 - 36.0 g/dL   RDW 12.7 11.5 - 15.5 %   Platelets 161 150 - 400 K/uL   nRBC 0.0 0.0 - 0.2 %   Neutrophils Relative % 67 %   Neutro Abs 4.9 1.7 - 7.7 K/uL   Lymphocytes Relative 23 %   Lymphs Abs 1.7 0.7 - 4.0 K/uL   Monocytes Relative 7 %   Monocytes Absolute 0.5 0.1 - 1.0 K/uL   Eosinophils Relative 2 %   Eosinophils Absolute 0.2 0.0 - 0.5 K/uL   Basophils Relative 0 %   Basophils Absolute 0.0 0.0 - 0.1 K/uL   Immature  Granulocytes 1 %   Abs Immature Granulocytes 0.04 0.00 - 0.07 K/uL    Comment: Performed at [facility], [address] [place], [place] [address]  Comprehensive metabolic panel     Status: Abnormal   Collection Time: 03/06/19  9:00 AM  Result Value Ref Range   Sodium 139 135 - 145 mmol/L   Potassium 4.0 3.5 - 5.1 mmol/L   Chloride 107 98 - 111 mmol/L   CO2 24 22 - 32 mmol/L   Glucose, Bld 196 (H) 70 - 99 mg/dL   BUN 12 6 - 20 mg/dL   Creatinine, Ser 1.08 0.61 - 1.24 mg/dL   Calcium 8.8 (L) 8.9 - 10.3 mg/dL   Total Protein 6.5 6.5 - 8.1 g/dL   Albumin 3.7 3.5 - 5.0 g/dL   AST 27 15 - 41 U/L   ALT 39 0 - 44 U/L   Alkaline Phosphatase 95 38 - 126 U/L   Total Bilirubin 0.8 0.3 - 1.2 mg/dL   GFR calc non Af Amer >60 >60 mL/min   GFR calc Af Amer >60 >60 mL/min   Anion gap 8 5 - 15    Comment: Performed at [facility], [address] [place], [place] [address]  Magnesium     Status: None   Collection Time: 03/06/19  9:00 AM  Result Value Ref Range   Magnesium 2.0 1.7 - 2.4 mg/dL    Comment: Performed at [facility], [address] [place], [place] [address]  Glucose, capillary     Status: Abnormal   Collection Time: 03/06/19 12:20 PM  Result Value Ref Range   Glucose-Capillary 216 (H) 70 - 99 mg/dL   DG Abd Portable 1V  Result Date: 03/05/2019 CLINICAL DATA:  Hypertension. Diabetes. Severe headache. Abdominal pain. EXAM: PORTABLE ABDOMEN - 1 VIEW COMPARISON:  Ultrasound 09/01/2018. FINDINGS: Soft tissue structures are unremarkable. Stool noted throughout the colon. No bowel distention or free air. Degenerative changes lumbar spine and both hips. IMPRESSION: No acute abnormality identified. Electronically Signed   By: Marcello Moores  Register   On: 03/05/2019 11:45       Medical Problem List and Plan: 1.  Decreased sensation LUE with dizziness, tangential conversation with decreased safety awareness secondary to right medullary infarct.  -patient may  may shower  -ELOS/Goals: 5-8 days/supervision  Admit to CIR 2.  Antithrombotics: -DVT/anticoagulation:  Pharmaceutical: Lovenox  -antiplatelet therapy: ASA/Plavix.  3. Pain Management: Tylenol prn.  4. Mood: LCSW to follow for evaluation and support.   -antipsychotic agents: N/A 5. Neuropsych: This patient is?  Fully capable of making decisions on his own behalf. 6. Skin/Wound Care: Routine pressure relief measures.  7. Fluids/Electrolytes/Nutrition: Monitor I/O.  CMP ordered. 8.  T2DM with hyperglycemia: Hgb A1c- 8.6--poorly controlled. Will monitor BS ac/hs will continue SSI for now.  Invokana PTA, resumed.    Monitor with increased mobility 9. HTN: Monitor BP tid--continue to hold lisinopril  Monitor with increased mobility 10. COPD: On singulair and pulmicort.  11 Dyslipidemia: Continue Lipitor and fenofibrate. 12. Phenylketonuria: Dietary modifications. 13. Nausea/vomiting: Vestibular evaluation. Will order eye patch for diplopia.  Continue phenergan prn. Will add baclofen prn for hiccups which are likely contributing factor 14. Constipation: Resolved with lactulose. Will change to senna at bedtime.    Bary Leriche, PA-C 03/06/2019  I have personally performed a face to face diagnostic evaluation, including, but not limited to relevant history and physical exam findings, of this patient and developed relevant assessment and plan.  Additionally, I have reviewed and concur with the physician assistant's documentation above.  Delice Lesch, MD, ABPMR  The patient's status has not changed. The original post admission physician evaluation remains appropriate, and any changes from the pre-admission screening or documentation from the acute chart are noted above.   Delice Lesch, MD, ABPMR

## 2019-03-06 NOTE — Progress Notes (Signed)
Patient being transferred to CIR. Report given to [facility]. [facility] will transport.

## 2019-03-06 NOTE — Progress Notes (Signed)
Patient arrived to 4W26, A&Ox4. Patient c/o of headache and right shoulder pain. No s/s of respiratory distress. No complications noted at this time.  [name], LPN

## 2019-03-06 NOTE — Discharge Summary (Signed)
[facility] Discharge Summary   Patient: [name]. KGU:[id number]   PCP: [name], NP DOB: February 13, 1961   Date of admission: 03/02/2019   Date of discharge:  03/06/2019    Discharge Diagnoses:  Principal diagnosis Acute right lateral medullary infarct Active Problems:   Type 2 diabetes mellitus with hyperlipidemia (HCC)   CVA (cerebral vascular accident) ([place])  Admitted From: home Disposition:  CIR   Recommendations for Outpatient Follow-up:  1. PCP: please follow up with  2. Follow up LABS/TEST:  none  Follow-up Information    [name], NP. Schedule an appointment as soon as possible for a visit in 2 days.   Specialty: Nurse Practitioner Contact information: [place] [address] [phone]          Diet recommendation: Cardiac diet  Activity: The patient is advised to gradually reintroduce usual activities,as tolerated  Discharge Condition: good  Code Status: Full code   History of present illness: As per the H and P dictated on admission, " Alex M Marshal Eskew. is a 58 y.o. male with medical history significant of type 2 diabetes mellitus, hypertension, hyperlipidemia, mentally challenged, phenylketonuria, presents to the hospital with chief complaint of headache and weakness since yesterday.  Patient reports that had a fall a few days ago and has been having headaches since.  He also reports dizziness.  He also feels weak mainly on the right side and tends to lean towards the right when he is walking.  He denies any chest pain, he denies any shortness of breath, denies any fever or chills.  He denies any numbness or tingling.  He denies any visual changes however tends to keep his eyes closed during my interaction and tells me is because of his cataracts.  He is overall a poor historian."  Hospital Course:  Summary of his active problems in the hospital is as following. Acute CVA MRI with acute right lateral medullary  infarct CTA-reveals small right vertebral artery.  No significant stenosis (please see full report Echo within normal limit Neurology following-recommend Dual antiplatelet therapy with ASA 83m and Plavix 758mfor three weeks with change toASA 8196maily LDL target less than 70 PT/OT-OT recommends SNF/CIR  Type 2 diabetes mellitus sliding scale insulin and canagliflozin H A1c on 03/03/2019 was 8.6  Hyperlipidemia LDL 85 , triglyceride 222 Continue with fenofibrate and lipitor aggressive therapy  Essential hypertension slowly improving .   Resume home meds  Constipation. Adding bowel regimen. X-ray negative for any obstruction  Vertigo Due to CVA Added meclizine for symptomatic relief   Patient was seen by physical therapy, who recommended CIR, which was arranged. On the day of the discharge the patient's vitals were stable, and no other acute medical condition were reported by patient. the [facility] Procedures: Echocardiogram   DISCHARGE MEDICATION: Allergies as of 03/06/2019      Reactions   Bee Venom Hives   All kinds of bees   Other    Certain powders   Sulfa Antibiotics       Medication List    STOP taking these medications   simvastatin 40 MG tablet Commonly known as: ZOCOR     TAKE these medications   aspirin 81 MG EC tablet Take 1 tablet (81 mg total) by mouth daily.   atorvastatin 40 MG tablet Commonly known as: LIPITOR Take 1 tablet (40 mg total) by mouth daily at  6 PM.   canagliflozin 300 MG Tabs tablet Commonly known as: Invokana Take 1 tablet (300 mg total) by mouth daily before breakfast.   clobetasol cream 0.05 % Commonly known as: TEMOVATE Apply 1 application topically 2 (two) times daily.   clopidogrel 75 MG tablet Commonly known as: PLAVIX Take 1 tablet (75 mg total) by mouth daily.   EPINEPHrine 0.3 mg/0.3 mL Soaj injection Commonly known as: EPI-PEN Inject 0.3  mLs (0.3 mg total) into the muscle once as needed for up to 1 dose.   fenofibrate 48 MG tablet Commonly known as: TRICOR TAKE 1 TABLET BY MOUTH ONCE DAILY   fluticasone 110 MCG/ACT inhaler Commonly known as: FLOVENT HFA Inhale 1 puff into the lungs 2 (two) times daily. What changed: when to take this   gabapentin 300 MG capsule Commonly known as: NEURONTIN TAKE 1 CAPSULE BY MOUTH AT BEDTIME FOR ONE WEEK  THEN TAKE ONE CAPSULE TWICE DAILY What changed:   how much to take  how to take this  when to take this  additional instructions   lisinopril 20 MG tablet Commonly known as: ZESTRIL TAKE 1 TABLET BY MOUTH ONCE DAILY   loratadine 10 MG tablet Commonly known as: CLARITIN Take 1 tablet (10 mg total) by mouth daily.   meclizine 12.5 MG tablet Commonly known as: ANTIVERT Take 1 tablet (12.5 mg total) by mouth 3 (three) times daily.   meloxicam 15 MG tablet Commonly known as: MOBIC TAKE 1 TABLET BY MOUTH ONCE DAILY   montelukast 10 MG tablet Commonly known as: SINGULAIR TAKE 1 TABLET BY MOUTH ONCE EVERY EVENING AT BEDTIME. What changed: See the new instructions.   nitroGLYCERIN 0.4 MG SL tablet Commonly known as: NITROSTAT Place 1 tablet (0.4 mg total) under the tongue every 5 (five) minutes as needed for chest pain.   omeprazole 20 MG capsule Commonly known as: PRILOSEC TAKE 1 CAPSULE BY MOUTH ONCE DAILY   OneTouch Delica Lancets Fine Misc TEST once daily   OneTouch Delica Lancets 38S Misc TEST ONCE A DAY.   OneTouch Ultra test strip Generic drug: glucose blood TEST ONCE DAILY   polyethylene glycol 17 g packet Commonly known as: MiraLax Take 17 g by mouth daily.   ProAir HFA 108 (90 Base) MCG/ACT inhaler Generic drug: albuterol INHALE 2 PUFFS BY MOUTH EVERY 6 HOURS IF NEEDED FOR COUGH OR WHEEZING.   senna-docusate 8.6-50 MG tablet Commonly known as: Senokot-S Take 2 tablets by mouth daily as needed for mild constipation.   Vitamin D3 1.25 MG  (50000 UT) Caps Take 50,000 Units by mouth once a week.      Allergies  Allergen Reactions  . Bee Venom Hives    All kinds of bees  . Other     Certain powders  . Sulfa Antibiotics    Discharge Instructions    Diet - low sodium heart healthy   Complete by: As directed    Increase activity slowly   Complete by: As directed      Discharge Exam: Filed Weights   03/02/19 0222 03/02/19 2252  Weight: 79.4 kg 79.9 kg   Vitals:   03/06/19 0726 03/06/19 0802  BP:  (!) 176/90  Pulse:  (!) 46  Resp:  17  Temp:  97.8 F (36.6 C)  SpO2: 98% 99%   General: Appear in mild distress, no Rash; Oral Mucosa Clear, moist. no Abnormal Mass Or lumps Cardiovascular: S1 and S2 Present, no Murmur, Respiratory: normal respiratory effort, Bilateral Air entry present  and Clear to Auscultation, no Crackles, no wheezes Abdomen: Bowel Sound present, Soft and no tenderness, no hernia Extremities: no Pedal edema, no calf tenderness Neurology: alert and oriented to time, place, and person affect appropriate.  The results of significant diagnostics from this hospitalization (including imaging, microbiology, ancillary and laboratory) are listed below for reference.    Significant Diagnostic Studies: CT ANGIO HEAD W OR WO CONTRAST  Result Date: 03/02/2019 CLINICAL DATA:  Acute infarct right medulla EXAM: CT ANGIOGRAPHY HEAD AND NECK TECHNIQUE: Multidetector CT imaging of the head and neck was performed using the standard protocol during bolus administration of intravenous contrast. Multiplanar CT image reconstructions and MIPs were obtained to evaluate the vascular anatomy. Carotid stenosis measurements (when applicable) are obtained utilizing NASCET criteria, using the distal internal carotid diameter as the denominator. CONTRAST:  17m OMNIPAQUE IOHEXOL 350 MG/ML SOLN COMPARISON:  MRI head and CT head 03/02/2019 FINDINGS: CT HEAD FINDINGS Brain: No evidence of acute infarction, hemorrhage, hydrocephalus,  extra-axial collection or mass lesion/mass effect. Acute infarct right medulla not identified by CT. Vascular: Negative for hyperdense vessel Skull: Negative Sinuses: Negative Orbits: Negative Review of the MIP images confirms the above findings CTA NECK FINDINGS Aortic arch: Standard branching. Imaged portion shows no evidence of aneurysm or dissection. No significant stenosis of the major arch vessel origins. Right carotid system: Mild atherosclerotic disease right carotid bulb without significant right carotid stenosis Left carotid system: Atherosclerotic disease proximal left internal carotid artery narrowing the lumen to 2.8 mm corresponding to approximately 20% diameter stenosis. Vertebral arteries: Left vertebral dominant. Left vertebral artery widely patent to the basilar Small right vertebral artery which ends in PICA. No prior studies available for comparison. Given the right medullary infarct, is not possible acute occlusion distal right vertebral artery or a congenital variation. Skeleton: No acute skeletal abnormality. Other neck: Negative Upper chest: Negative Review of the MIP images confirms the above findings CTA HEAD FINDINGS Anterior circulation: Cavernous carotid widely patent bilaterally without stenosis. Anterior and middle cerebral arteries patent bilaterally without stenosis. Posterior circulation: Left vertebral arteries widely patent to the basilar. Left PICA not visualized. Left AICA is patent. Distal right vertebral artery ends in PICA. This is small vessel in this could be congenital variation however there is an acute right PICA infarct. The basilar is patent. Superior cerebellar and posterior cerebral arteries are patent bilaterally posterior communicating arteries are patent bilaterally. Venous sinuses: Patent Anatomic variants: None Review of the MIP images confirms the above findings IMPRESSION: 1. Acute infarct right medulla not identified by CT. 2. Small right vertebral artery  which ends in PICA. This could be congenital variation however acute occlusion distal right vertebral artery is a consideration given acute right PICA infarct. 3. 20% diameter stenosis proximal left internal carotid artery. No significant right carotid stenosis. Electronically Signed   By: CFranchot GalloM.D.   On: 03/02/2019 13:54   CT HEAD WO CONTRAST  Result Date: 03/02/2019 CLINICAL DATA:  Ataxia, stroke suspected Possible stroke Headache, slowing, right-sided weakness. EXAM: CT HEAD WITHOUT CONTRAST TECHNIQUE: Contiguous axial images were obtained from the base of the skull through the vertex without intravenous contrast. COMPARISON:  None. FINDINGS: Brain: No intracranial hemorrhage, mass effect, or midline shift. No hydrocephalus. The basilar cisterns are patent. No evidence of territorial infarct or acute ischemia. No extra-axial or intracranial fluid collection. Vascular: No hyperdense vessel or unexpected calcification. Skull: Normal. Negative for fracture or focal lesion. Sinuses/Orbits: Paranasal sinuses and mastoid air cells are clear. The visualized orbits  are unremarkable. Other: None. IMPRESSION: Unremarkable noncontrast head CT. Electronically Signed   By: Keith Rake M.D.   On: 03/02/2019 02:45   CT ANGIO NECK W OR WO CONTRAST  Result Date: 03/02/2019 CLINICAL DATA:  Acute infarct right medulla EXAM: CT ANGIOGRAPHY HEAD AND NECK TECHNIQUE: Multidetector CT imaging of the head and neck was performed using the standard protocol during bolus administration of intravenous contrast. Multiplanar CT image reconstructions and MIPs were obtained to evaluate the vascular anatomy. Carotid stenosis measurements (when applicable) are obtained utilizing NASCET criteria, using the distal internal carotid diameter as the denominator. CONTRAST:  32m OMNIPAQUE IOHEXOL 350 MG/ML SOLN COMPARISON:  MRI head and CT head 03/02/2019 FINDINGS: CT HEAD FINDINGS Brain: No evidence of acute infarction,  hemorrhage, hydrocephalus, extra-axial collection or mass lesion/mass effect. Acute infarct right medulla not identified by CT. Vascular: Negative for hyperdense vessel Skull: Negative Sinuses: Negative Orbits: Negative Review of the MIP images confirms the above findings CTA NECK FINDINGS Aortic arch: Standard branching. Imaged portion shows no evidence of aneurysm or dissection. No significant stenosis of the major arch vessel origins. Right carotid system: Mild atherosclerotic disease right carotid bulb without significant right carotid stenosis Left carotid system: Atherosclerotic disease proximal left internal carotid artery narrowing the lumen to 2.8 mm corresponding to approximately 20% diameter stenosis. Vertebral arteries: Left vertebral dominant. Left vertebral artery widely patent to the basilar Small right vertebral artery which ends in PICA. No prior studies available for comparison. Given the right medullary infarct, is not possible acute occlusion distal right vertebral artery or a congenital variation. Skeleton: No acute skeletal abnormality. Other neck: Negative Upper chest: Negative Review of the MIP images confirms the above findings CTA HEAD FINDINGS Anterior circulation: Cavernous carotid widely patent bilaterally without stenosis. Anterior and middle cerebral arteries patent bilaterally without stenosis. Posterior circulation: Left vertebral arteries widely patent to the basilar. Left PICA not visualized. Left AICA is patent. Distal right vertebral artery ends in PICA. This is small vessel in this could be congenital variation however there is an acute right PICA infarct. The basilar is patent. Superior cerebellar and posterior cerebral arteries are patent bilaterally posterior communicating arteries are patent bilaterally. Venous sinuses: Patent Anatomic variants: None Review of the MIP images confirms the above findings IMPRESSION: 1. Acute infarct right medulla not identified by CT. 2. Small  right vertebral artery which ends in PICA. This could be congenital variation however acute occlusion distal right vertebral artery is a consideration given acute right PICA infarct. 3. 20% diameter stenosis proximal left internal carotid artery. No significant right carotid stenosis. Electronically Signed   By: CFranchot GalloM.D.   On: 03/02/2019 13:54   MR BRAIN WO CONTRAST  Result Date: 03/02/2019 CLINICAL DATA:  Acute headache with normal neuro exam. EXAM: MRI HEAD WITHOUT CONTRAST TECHNIQUE: Multiplanar, multiecho pulse sequences of the brain and surrounding structures were obtained without intravenous contrast. COMPARISON:  Head CT from earlier today FINDINGS: Brain: Restricted diffusion in the lateral right medulla. No pre-existing infarct. No acute hemorrhage, hydrocephalus, or masslike finding. Two or 3 remote white matter insults that are tiny and nonspecific. Brain volume is normal. Vascular: Preserved flow voids. The left vertebral artery is dominant. The right vertebral artery is difficult to assess given small size, but appears to have a preserved flow void at the level of the V3 segment on axial T2 weighted imaging. Skull and upper cervical spine: Normal marrow signal Sinuses/Orbits: Gaze preference to the right. Other: Progressively motion degraded IMPRESSION: Acute  right lateral medullary infarct. Electronically Signed   By: Monte Fantasia M.D.   On: 03/02/2019 09:37   US Carotid Bilateral (at [facility] and [facility] only)  Result Date: 03/02/2019 CLINICAL DATA:  CVA. EXAM: BILATERAL CAROTID DUPLEX ULTRASOUND TECHNIQUE: Pearline Cables scale imaging, color Doppler and duplex ultrasound were performed of bilateral carotid and vertebral arteries in the neck. COMPARISON:  None. FINDINGS: Criteria: Quantification of carotid stenosis is based on velocity parameters that correlate the residual internal carotid diameter with NASCET-based stenosis levels, using the diameter of the distal internal carotid lumen as the  denominator for stenosis measurement. The following velocity measurements were obtained: RIGHT ICA: 80/19 cm/sec CCA: 20/25 cm/sec SYSTOLIC ICA/CCA RATIO:  0.9 ECA: 139 cm/sec LEFT ICA: 70/14 cm/sec CCA: 42/70 cm/sec SYSTOLIC ICA/CCA RATIO:  0.8 ECA: 60 cm/sec RIGHT CAROTID ARTERY: Small amount of heterogeneous plaque at the right carotid bulb. External carotid artery is patent with normal waveform. Small amount of plaque in the proximal internal carotid artery. Normal waveforms and velocities in the internal carotid artery. RIGHT VERTEBRAL ARTERY: Antegrade flow and normal waveform in the right vertebral artery. LEFT CAROTID ARTERY: Small amount of plaque at the left carotid bulb. External carotid artery is patent with normal waveform. Small amount of plaque in the proximal internal carotid artery. Normal waveforms and velocities in the internal carotid artery. LEFT VERTEBRAL ARTERY: Antegrade flow and normal waveform in the left vertebral artery. IMPRESSION: 1. Mild atherosclerotic disease in the bilateral carotid arteries. Estimated degree of stenosis in the internal carotid arteries is less than 50% bilaterally. 2. Patent vertebral arteries with antegrade flow. Electronically Signed   By: Markus Daft M.D.   On: 03/02/2019 14:18   DG Abd Portable 1V  Result Date: 03/05/2019 CLINICAL DATA:  Hypertension. Diabetes. Severe headache. Abdominal pain. EXAM: PORTABLE ABDOMEN - 1 VIEW COMPARISON:  Ultrasound 09/01/2018. FINDINGS: Soft tissue structures are unremarkable. Stool noted throughout the colon. No bowel distention or free air. Degenerative changes lumbar spine and both hips. IMPRESSION: No acute abnormality identified. Electronically Signed   By: Marcello Moores  Register   On: 03/05/2019 11:45   ECHOCARDIOGRAM COMPLETE  Result Date: 03/03/2019   ECHOCARDIOGRAM REPORT   Patient Name:   [name]. Date of Exam: 03/02/2019 Medical Rec #:  [id number]              Height:       64.0 in Accession #:     [id number]             Weight:       175.0 lb Date of Birth:  1960-10-30             BSA:          1.85 m Patient Age:    32 years               BP:           137/82 mmHg Patient Gender: M                      HR:           48 bpm. Exam Location:  [facility] Procedure: 2D Echo, Cardiac Doppler and Color Doppler Indications:     I163.9 Stroke  History:         Patient has prior history of Echocardiogram examinations, most                  recent 11/04/2017. Risk Factors:Hypertension, Diabetes and  Dyslipidemia. Asthma. Arrhythmia.  Sonographer:     Wilford Sports Rodgers-Jones Referring Phys:  [place] Diagnosing Phys: Nelva Bush MD IMPRESSIONS  1. Left ventricular ejection fraction, by visual estimation, is 60 to 65%. The left ventricle has normal function. There is no left ventricular hypertrophy.  2. The left ventricle has no regional wall motion abnormalities.  3. Global right ventricle has normal systolic function.The right ventricular size is normal. Right vetricular wall thickness was not assessed.  4. Left atrial size was normal.  5. Right atrial size was normal.  6. The mitral valve is normal in structure. Trivial mitral valve regurgitation.  7. The tricuspid valve is grossly normal. Tricuspid valve regurgitation is trivial.  8. The aortic valve was not well visualized. Aortic valve regurgitation is not visualized. No evidence of aortic valve sclerosis or stenosis.  9. The pulmonic valve was not well visualized. Pulmonic valve regurgitation is not visualized. 10. TR signal is inadequate for assessing pulmonary artery systolic pressure. 11. The inferior vena cava is normal in size with greater than 50% respiratory variability, suggesting right atrial pressure of 3 mmHg. 12. The interatrial septum was not well visualized. FINDINGS  Left Ventricle: Left ventricular ejection fraction, by visual estimation, is 60 to 65%. The left ventricle has normal function. The left ventricle has no  regional wall motion abnormalities. The left ventricular internal cavity size was the left ventricle is normal in size. There is no left ventricular hypertrophy. Left ventricular diastolic parameters were normal. Right Ventricle: The right ventricular size is normal. Right vetricular wall thickness was not assessed. Global RV systolic function is has normal systolic function. Left Atrium: Left atrial size was normal in size. Right Atrium: Right atrial size was normal in size Pericardium: There is no evidence of pericardial effusion. Mitral Valve: The mitral valve is normal in structure. Trivial mitral valve regurgitation. Tricuspid Valve: The tricuspid valve is grossly normal. Tricuspid valve regurgitation is trivial. Aortic Valve: The aortic valve was not well visualized. Aortic valve regurgitation is not visualized. The aortic valve is structurally normal, with no evidence of sclerosis or stenosis. Pulmonic Valve: The pulmonic valve was not well visualized. Pulmonic valve regurgitation is not visualized. Pulmonic regurgitation is not visualized. No evidence of pulmonic stenosis. Aorta: The aortic root is normal in size and structure. Pulmonary Artery: The pulmonary artery is not well seen. Venous: The inferior vena cava is normal in size with greater than 50% respiratory variability, suggesting right atrial pressure of 3 mmHg. IAS/Shunts: The interatrial septum was not well visualized.  LEFT VENTRICLE PLAX 2D LVIDd:         4.87 cm Diastology LVIDs:         3.22 cm LV e' lateral:   14.80 cm/s LV PW:         0.65 cm LV E/e' lateral: 5.9 LV IVS:        0.68 cm LV e' medial:    9.90 cm/s LV SV:         70 ml   LV E/e' medial:  8.8 LV SV Index:   36.28  RIGHT VENTRICLE RV Basal diam:  3.99 cm RV S prime:     14.60 cm/s TAPSE (M-mode): 3.4 cm LEFT ATRIUM             Index       RIGHT ATRIUM           Index LA diam:        3.80 cm 2.06 cm/m  RA Area:     15.50 cm LA Vol (A2C):   52.0 ml 28.13 ml/m RA Volume:   42.40  ml  22.94 ml/m LA Vol (A4C):   23.6 ml 12.77 ml/m LA Biplane Vol: 35.4 ml 19.15 ml/m   AORTA Ao Root diam: 3.30 cm MITRAL VALVE MV Area (PHT): 3.91 cm MV PHT:        56.26 msec MV Decel Time: 194 msec MV E velocity: 86.80 cm/s 103 cm/s MV A velocity: 47.50 cm/s 70.3 cm/s MV E/A ratio:  1.83       1.5  Harrell Gave End MD Electronically signed by Nelva Bush MD Signature Date/Time: 03/03/2019/8:31:38 PM    Final     Microbiology: Recent Results (from the past 240 hour(s))  SARS CORONAVIRUS 2 (TAT 6-24 HRS) Nasopharyngeal Nasopharyngeal Swab     Status: None   Collection Time: 03/02/19  1:54 PM   Specimen: Nasopharyngeal Swab  Result Value Ref Range Status   SARS Coronavirus 2 NEGATIVE NEGATIVE Final    Comment: (NOTE) SARS-CoV-2 target nucleic acids are NOT DETECTED. The SARS-CoV-2 RNA is generally detectable in upper and lower respiratory specimens during the acute phase of infection. Negative results do not preclude SARS-CoV-2 infection, do not rule out co-infections with other pathogens, and should not be used as the sole basis for treatment or other patient management decisions. Negative results must be combined with clinical observations, patient history, and epidemiological information. The expected result is Negative. Fact Sheet for Patients: SugarRoll.be Fact Sheet for Healthcare Providers: https://www.woods-mathews.com/ This test is not yet approved or cleared by the [place] FDA and  has been authorized for detection and/or diagnosis of SARS-CoV-2 by FDA under an Emergency Use Authorization (EUA). This EUA will remain  in effect (meaning this test can be used) for the duration of the COVID-19 declaration under Section 56 4(b)(1) of the Act, 21 U.S.C. section 360bbb-3(b)(1), unless the authorization is terminated or revoked sooner. Performed at [facility], [place] [address], [place], [place] [address]       Labs: CBC: Recent Labs  Lab 03/02/19 0311 03/05/19 0339 03/06/19 0900  WBC 9.2 6.5 7.3  NEUTROABS 7.1  --  4.9  HGB 14.4 14.7 15.5  HCT 40.5 44.1 44.8  MCV 86.7 91.3 87.2  PLT 165 159 182   Basic Metabolic Panel: Recent Labs  Lab 03/02/19 0311 03/05/19 0339 03/06/19 0900  NA 136 141 139  K 4.0 3.7 4.0  CL 103 108 107  CO2 _0 GLUCOSE 274* 217* 196*  BUN _1 CREATININE 1.20 1.03 1.08  CALCIUM 9.1 8.9 8.8*  MG  --   --  2.0   Liver Function Tests: Recent Labs  Lab 03/02/19 0311 03/06/19 0900  AST 31 27  ALT 33 39  ALKPHOS 120 95  BILITOT 0.5 0.8  PROT 6.7 6.5  ALBUMIN 4.1 3.7   No results for input(s): LIPASE, AMYLASE in the last 168 hours. No results for input(s): AMMONIA in the last 168 hours. Cardiac Enzymes: No results for input(s): CKTOTAL, CKMB, CKMBINDEX, TROPONINI in the last 168 hours. BNP (last 3 results) No results for input(s): BNP in the last 8760 hours. CBG: Recent Labs  Lab 03/04/19 2115 03/05/19 1037 03/05/19 1154 03/05/19 1644 03/06/19 0803  GLUCAP 213* 224* 206* 251* 176*    Time spent: 35 minutes  Signed:  Berle Mull  [facility]  03/06/2019 11:09 AM

## 2019-03-06 NOTE — Progress Notes (Signed)
Inpatient Rehabilitation-Admissions Coordinator   I have received insurance approval and medical clearance from Dr. Posey Pronto for admit to CIR today. I have reviewed insurance letter with pt and he is on board with pursing CIR today. I will alert RN and TOC team regarding admit to IP Rehab today.   Please call if questions.   Raechel Ache, OTR/L  Rehab Admissions Coordinator  [phone] 03/06/2019 10:25 AM

## 2019-03-06 NOTE — Progress Notes (Signed)
Alex Arn, MD  Physician  Physical Medicine and Rehabilitation  PMR Pre-admission  Signed  Date of Service:  03/05/2019  5:55 PM      Related encounter: ED to Hosp-Admission (Discharged) from 03/02/2019 in [place] (1C)      Signed        PMR Admission Coordinator Pre-Admission Assessment   Patient: [name]. is an 58 y.o., male MRN: [id number] DOB: 1960-09-23 Height: 5' 4" (162.6 cm) Weight: 79.9 kg   Insurance Information HMO: Yes    PPO:      PCP:      IPA:      80/20:      OTHER:  PRIMARY: U[employer] Medicare      Policy#: [id number]      Subscriber: Patient CM Name: [name]      Phone#: did not provide      Fax#: [phone] Pre-Cert#: [id number]      Employer:  [employer] provided by [name] at [facility] for CIR approval. Pt is approved for 7 days with start start date of 12/17. Next review date is 12/23. Clinical updates are due to (f): [phone] Benefits:  Phone #: online     Name: [email or web address] Eff. Date: 08/18/2018 - 03/19/2019     Deduct: does not have ($0)      Out of Pocket Max: $3,600 ($142.42 met -this resents on 03/20/2019      Life Max: NA CIR: $295/day co-pay for days 1-5, $0/day co-pay for days 6+      SNF: $0/day co-pay for days 1-20, $160/day co-pay 21-43, $0/day co-pay for days 44-100; limited to 100 days/cal yr Outpatient: $30/visit co-pay; limited by medical necessity   Home Health: 100% coverage; limited by medical necessity     DME: 80% coverage     Co-Pay: 20% co-insurance Providers:  *pt will need to be out before Jan 1st as he is concerned about co-payments.  SECONDARY: None      Policy#:       Subscriber:  CM Name:       Phone#:     Fax#:  Pre-Cert#:       Employer:  Benefits:  Phone #:      Name:  Eff. Date:      Deduct:      Out of Pocket Max:       Life Max:  CIR:       SNF:  Outpatient:     Co-Pay:  Home Health:       Co-Pay:  DME:      Co-Pay:   Medicaid Application Date      Case Manager:    Disability Application Date:       Case Worker:    The "Data Collection Information Summary" for patients in Inpatient Rehabilitation Facilities with attached "Privacy Act [place] Records" was provided and verbally reviewed with: Family   Emergency Contact Information         Contact Information     Name Relation Home Work [place] Spouse [phone]        [name] Mother [place] Other     [phone]         Current Medical History  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 Pt admitted to [facility]  after a fall a few days ago with persistent headaches ever since. Pt also complained on dizziness and right sided weakness. 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 His echo was normal and neurology has recommended dual antiplatelet therapy. Pt has been seen by therapies with recommendation for CIR. Pt is to admit to CIR on 03/06/2019.    Complete NIHSS TOTAL: 0   Patient's [facility] record from [facility] has been reviewed by the rehabilitation admission coordinator and physician.   Past Medical History      Past Medical History:  Diagnosis Date  . Arrhythmia    . Asthma    . Cyst of kidney, acquired    . Diabetes mellitus without complication ([place]) 1275    type 2  . GERD (gastroesophageal reflux disease)    . History of chicken pox    . History of measles as a child    . History of PKU    . Hyperlipidemia    . Hypertension    . IBS (irritable bowel syndrome)    . Irregular heart beat    . Mentally challenged    . Pancreatitis        Family History   family history includes Cancer in his maternal grandfather, maternal grandmother, mother, and paternal grandfather; Diabetes in his father; Heart  disease in his father.   Prior Rehab/Hospitalizations Has the patient had prior rehab or hospitalizations prior to admission? No   Has the patient had major surgery during 100 days prior to admission? No               Current Medications   Current Facility-Administered Medications:  .  acetaminophen (TYLENOL) tablet 650 mg, 650 mg, Oral, Q4H PRN, 650 mg at 03/06/19 0813 **OR** acetaminophen (TYLENOL) 160 MG/5ML solution 650 mg, 650 mg, Per Tube, Q4H PRN **OR** acetaminophen (TYLENOL) suppository 650 mg, 650 mg, Rectal, Q4H PRN, Cruzita Lederer, Costin M, MD .  aspirin EC tablet 81 mg, 81 mg, Oral, Daily, Caren Griffins, MD, 81 mg at 03/05/19 1017 .  atorvastatin (LIPITOR) tablet 40 mg, 40 mg, Oral, q1800, Lavina Hamman, MD, 40 mg at 03/05/19 2121 .  budesonide (PULMICORT) nebulizer solution 0.25 mg, 0.25 mg, Nebulization, BID, Oswald Hillock, RPH, 0.25 mg at 03/06/19 0725 .  clopidogrel (PLAVIX) tablet 75 mg, 75 mg, Oral, Daily, Alexis Goodell, MD, 75 mg at 03/05/19 1017 .  enoxaparin (LOVENOX) injection 40 mg, 40 mg, Subcutaneous, Q24H, Caren Griffins, MD, 40 mg at 03/05/19 2122 .  fenofibrate tablet 54 mg, 54 mg, Oral, Daily, Caren Griffins, MD, 54 mg at 03/05/19 1025 .  gabapentin (NEURONTIN) capsule 300 mg, 300 mg, Oral, BID, Caren Griffins, MD, 300 mg at 03/05/19 2121 .  insulin aspart (novoLOG) injection 0-9 Units, 0-9 Units, Subcutaneous, TID WC, Caren Griffins, MD, 2 Units at 03/06/19 0813 .  lactulose (CHRONULAC) 10 GM/15ML solution 20 g, 20 g, Oral, BID, Lavina Hamman, MD, 20 g at 03/05/19 2122 .  meclizine (ANTIVERT) tablet 12.5 mg, 12.5 mg, Oral, TID, Lavina Hamman, MD .  montelukast (SINGULAIR) tablet 10 mg, 10 mg, Oral, QHS, Max Sane, MD, 10 mg at 03/05/19 2121 .  ondansetron (ZOFRAN) injection 4 mg, 4 mg, Intravenous, Q6H PRN, Lang Snow, NP, 4 mg at 03/05/19 1017 .  pantoprazole (PROTONIX) EC tablet 40 mg, 40 mg, Oral, Daily, Caren Griffins,  MD, 40 mg at 03/05/19 1018 .  promethazine (PHENERGAN) injection  12.5 mg, 12.5 mg, Intravenous, Q6H PRN, Lavina Hamman, MD, 12.5 mg at 03/05/19 1530   Patients Current Diet:     Diet Order                      Diet - low sodium heart healthy           Diet Carb Modified Fluid consistency: Thin; Room service appropriate? Yes with Assist  Diet effective now                   Precautions / Restrictions Precautions Precautions: Fall Restrictions Weight Bearing Restrictions: No    Has the patient had 2 or more falls or a fall with injury in the past year? Yes   Prior Activity Level Limited Community (1-2x/wk): not too active in the community; on disability. drove [employer]    Prior Functional Level Self Care: Did the patient need help bathing, dressing, using the toilet or eating? Independent   Indoor Mobility: Did the patient need assistance with walking from room to room (with or without device)? Independent   Stairs: Did the patient need assistance with internal or external stairs (with or without device)? Independent   Functional Cognition: Did the patient need help planning regular tasks such as shopping or remembering to take medications? Needed some help   Home Assistive Devices / Equipment Home Assistive Devices/Equipment: CBG Meter, Blood pressure cuff Home Equipment: Hand held shower head(reports his walker is broken)   Prior Device Use: Indicate devices/aids used by the patient prior to current illness, exacerbation or injury? Walker   Current Functional Level Cognition   Overall Cognitive Status: History of cognitive impairments - at baseline Orientation Level: Oriented X4 General Comments: still has lack of safety awareness and attention to task. needs cues to foucs on tasks at times.  LOB's occur more when he is distracted.    Extremity Assessment (includes Sensation/Coordination)   Upper Extremity Assessment: Overall WFL for tasks assessed RUE Deficits /  Details: grossly 4-/5 at shoulder, 4/5 elbow flex/ext, grip; reports impaired sensation and impaired FMC with testing RUE Sensation: decreased light touch RUE Coordination: decreased fine motor, decreased gross motor  Lower Extremity Assessment: Generalized weakness(R LE grossly 4-/5; L LE grossly 4/5) RLE Deficits / Details: grossly at least 3+/5, impaired sensation per pt RLE Sensation: decreased light touch RLE Coordination: decreased gross motor     ADLs   Overall ADL's : Needs assistance/impaired Toilet Transfer: BSC, Moderate assistance, Set up, RW Toilet Transfer Details (indicate cue type and reason): Pt needed to use the [facility] and was impulsive getting up and sitting at EOB and finding midline and slowing down movements to be safe before standing with mod assist with max cues due to safety and problem solving with motor planning.  He also has decreased vision in B eyes and keeps alternating opening and closing his eyes due to visual changes.  Numbness in R hand increased as he was pushing on BSC to help with stand and leaning to R to use L hand to wipe and needed mod assist for thoroughness of clean up and mod cues for safety again. General ADL Comments: fxl mobility deferred 2/2 pt declining (headache), Max A for LB ADL, Min A UB ADL     Mobility   Overal bed mobility: Needs Assistance Bed Mobility: Supine to Sit Supine to sit: Min assist General bed mobility comments: needs cues for safety. Dizziness noted upon sitting at EOB. Improved upright posture  note during static sitting     Transfers   Overall transfer level: Needs assistance Equipment used: Rolling walker (2 wheeled) Transfers: Sit to/from Stand Sit to Stand: Min assist General transfer comment: cues for safe technique. Braces B LE against bed upon standing. Decreased lateral lean noted this date. Complaints of dizziness persist in standing.     Ambulation / Gait / Stairs / Wheelchair Mobility    Ambulation/Gait Ambulation/Gait assistance: Herbalist (Feet): 70 Feet Assistive device: Rolling walker (2 wheeled) Gait Pattern/deviations: Step-through pattern, Decreased step length - right, Decreased step length - left General Gait Details: 35' x 2 with min a x 1. LOB when distracted with cues to attend to gait. Gait velocity: decreased     Posture / Balance Dynamic Sitting Balance Sitting balance - Comments: upright posture Balance Overall balance assessment: Needs assistance Sitting-balance support: Feet supported, Bilateral upper extremity supported Sitting balance-Leahy Scale: Good Sitting balance - Comments: upright posture Standing balance support: Bilateral upper extremity supported Standing balance-Leahy Scale: Fair Standing balance comment: no R lean noted today     Special needs/care consideration BiPAP/CPAP : no CPM : no Continuous Drip IV : no Dialysis : no        Days : no Life Vest : no Oxygen : no Special Bed : no Trach Size : no Wound Vac (area) :no      Location : no Skin : excoriated right arm                            Bowel mgmt: last BM 03/04/2019 Bladder mgmt: continent Diabetic mgmt: yes Behavioral consideration : history of phenylketonuria with cognitive deficits at baseline Chemo/radiation : no    Previous Home Environment (from acute therapy documentation) Living Arrangements: Spouse/significant other Available Help at Discharge: Family Type of Home: Mobile home Home Layout: One level Home Access: Ramped entrance Bathroom Shower/Tub: Chiropodist: Standard Home Care Services: No   Discharge Living Setting Plans for Discharge Living Setting: Mobile Home(lives with wife) Type of Home at Discharge: Mobile home Discharge Home Layout: One level Discharge Home Access: Ramped entrance Discharge Bathroom Shower/Tub: Tub/shower unit Discharge Bathroom Toilet: Standard Discharge Bathroom Accessibility: Yes How  Accessible: Accessible via walker Does the patient have any problems obtaining your medications?: Yes (Describe)(financial)   Social/Family/Support Systems Patient Roles: Spouse Contact Information: wife: [name] [phone] Anticipated Caregiver: wife + wife's sister checks in Anticipated Caregiver's Contact Information: see above Ability/Limitations of Caregiver: supervision Caregiver Availability: 24/7 Discharge Plan Discussed with Primary Caregiver: Yes Is Caregiver In Agreement with Plan?: Yes Does Caregiver/Family have Issues with Lodging/Transportation while Pt is in Rehab?: No   Goals/Additional Needs Patient/Family Goal for Rehab: PT/OT: Supervision; SLP: NA Expected length of stay: 5-8 days Cultural Considerations: NA Dietary Needs: carb modified; thin liquids; room service with assist. Calorie level medium 1600-2000.  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 Pt had ankle monitor cut off for MRI in ED. Please call Parole Officer [place] prior to DC from rehab for replacement of monitor [phone] Pt/Family Agrees to Admission and willing to participate: Yes Program Orientation Provided & Reviewed with Pt/Caregiver Including Roles  & Responsibilities: Yes(pt and hiw wife)  Barriers to Discharge: Lack of/limited family support, Behavior, Other (comments)(most likely cannot be placed in SNF)   Decrease burden of Care through IP rehab admission: NA   Possible need for SNF  placement upon discharge: Not anticipated. Unlikely pt will be able to be placed in a SNF. Pt's will have 24/7 supervision from his wife and can have assist from family at times. Anticipated pt can return to Supervision level in a short period of time and can return home safely.    Patient Condition: I have reviewed medical records from [facility], spoken with CSW, and patient and spouse. I discussed via phone for  inpatient rehabilitation assessment.  Patient will benefit from ongoing PT and OT, can actively participate in 3 hours of therapy a day 5 days of the week, and can make measurable gains during the admission.  Patient will also benefit from the coordinated team approach during an Inpatient Acute Rehabilitation admission.  The patient will receive intensive therapy as well as Rehabilitation physician, nursing, social worker, and care management interventions.  Due to safety, skin/wound care, disease management, medication administration, pain management and patient education the patient requires 24 hour a day rehabilitation nursing.  The patient is currently Min A with mobility and Min/Mod A for basic ADLs.  Discharge setting and therapy post discharge at home with home health is anticipated.  Patient has agreed to participate in the Acute Inpatient Rehabilitation Program and will admit 03/06/2019.   Preadmission Screen Completed By:  Raechel Ache, 03/06/2019 10:42 AM ______________________________________________________________________   Discussed status with Dr. Posey Pronto on 03/06/2019 at 10:38AM and received approval for admission today.   Admission Coordinator:  Raechel Ache, OT, time 10:38AM/Date 03/06/2019    Assessment/Plan: Diagnosis: Acute right lateral medullary infarct   1. Does the need for close, 24 hr/day Medical supervision in concert with the patient's rehab needs make it unreasonable for this patient to be served in a less intensive setting? Yes  2. Co-Morbidities requiring supervision/potential complications: type 2 DM, hypertension, hyperlipidemia, mentally challenged, and phenylketonuria 3. Due to safety, disease management and patient education, does the patient require 24 hr/day rehab nursing? Yes 4. Does the patient require coordinated care of a physician, rehab nurse, PT, OT, and SLP to address physical and functional deficits in the context of the above medical diagnosis(es)?  Yes Addressing deficits in the following areas: balance, endurance, locomotion, strength, transferring, bathing, dressing, toileting and psychosocial support 5. Can the patient actively participate in an intensive therapy program of at least 3 hrs of therapy 5 days a week? Yes 6. The potential for patient to make measurable gains while on inpatient rehab is excellent 7. Anticipated functional outcomes upon discharge from inpatient rehab: supervision PT, supervision OT, n/a SLP 8. Estimated rehab length of stay to reach the above functional goals is: 5-7 days. 9. Anticipated discharge destination: Home 10. Overall Rehab/Functional Prognosis: good     MD Signature: Delice Lesch, MD, ABPMR        Revision History Date/Time User Provider Type Action  03/06/2019 10:47 AM Alex Arn, MD Physician Sign  03/06/2019 10:42 AM Raechel Ache, OT Rehab Admission Coordinator Share   View Details Report

## 2019-03-06 NOTE — H&P (Signed)
Physical Medicine and Rehabilitation Admission H&P    Chief Complaint  Patient presents with  . Functional decline due to stroke.     HPI:  [name] is 58 year old male with history of HTN, T2DM, hyperlipidemia, PKU, mentally challenged who was admitted to [facility] on 03/02/2019 with fall 3 weeks ago with subsequent headache, dizziness, weakness, right brain with ambulation.  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  CTA head/neck showed acute infarct in right  [place] and small R-VA ending in PICA question congenital variation v/s acute distal R-VA occlusion.  Echocardiogram showed ejection fraction of 60-65% with trivial MVR.  Carotid dopplers showed patent VA and no significant ICA stenosis . Dr. Doy Mince felt that stroke was due to small vessel disease and recommended ASA/Plavix X 3 weeks followed by ASA 81 mg/daily.  Therapy evaluation revealed decreased sensation LUE with dizziness, tangential conversation with decreased safety awareness. CIR recommended due to functional decline.  Please see preadmission assessment from earlier today as well.   Review of Systems  Constitutional: Negative for chills and fever.  HENT: Negative for hearing loss and tinnitus.   Eyes: Positive for double vision.  Respiratory: Negative for cough and hemoptysis.   Cardiovascular: Negative for chest pain and palpitations.  Gastrointestinal: Positive for nausea and vomiting. Negative for constipation.  Genitourinary: Negative for dysuria.  Musculoskeletal: Positive for joint pain (left lateral knee pain). Negative for myalgias.  Skin: Negative for itching and rash.  Neurological: Positive for dizziness, sensory change, focal weakness and headaches.  Psychiatric/Behavioral: Positive for memory loss. The patient does not have insomnia.       Past Medical History:  Diagnosis Date  . Arrhythmia   . Asthma   . Cyst of kidney, acquired   . Diabetes mellitus without complication ([place])  2355   type 2  . GERD (gastroesophageal reflux disease)   . History of chicken pox   . History of measles as a child   . History of PKU   . Hyperlipidemia   . Hypertension   . IBS (irritable bowel syndrome)   . Irregular heart beat   . Mentally challenged   . Pancreatitis     Past Surgical History:  Procedure Laterality Date  . Cardiac Catherization     [facility]  . CARDIAC CATHETERIZATION     [facility]  . COLONOSCOPY    . ESOPHAGOGASTRODUODENOSCOPY (EGD) WITH PROPOFOL N/A 12/27/2016   Procedure: ESOPHAGOGASTRODUODENOSCOPY (EGD) WITH PROPOFOL;  Surgeon: Lin Landsman, MD;  Location: [place];  Service: Gastroenterology;  Laterality: N/A;  . HEMORRHOID SURGERY    . Ligament Removal Left    of left thumb: dr. Cleda Mccreedy  . ligament removal  of left thumb     Dr. Cleda Mccreedy  . NM GATED MYOCARDIAL STUDY (ARMX HX)  06/23/2014   Paraschos. Normal    Family History  Problem Relation Age of Onset  . Cancer Mother        throat  . Diabetes Father   . Heart disease Father   . Cancer Maternal Grandmother   . Cancer Maternal Grandfather        pancreatic  . Cancer Paternal Grandfather     Social History:  Married. Independent PTA. Disabled--wife handles fiances and helps with meds. He reports that he has never smoked. He has never used smokeless tobacco. He reports that he does not drink alcohol or use drugs.    Allergies  Allergen Reactions  . Bee Venom Hives  All kinds of bees  . Other     Certain powders  . Sulfa Antibiotics     Medications Prior to Admission  Medication Sig Dispense Refill  . aspirin EC 81 MG EC tablet Take 1 tablet (81 mg total) by mouth daily. 180 tablet 0  . atorvastatin (LIPITOR) 40 MG tablet Take 1 tablet (40 mg total) by mouth daily at 6 PM. 30 tablet 0  . canagliflozin (INVOKANA) 300 MG TABS tablet Take 1 tablet (300 mg total) by mouth daily before breakfast. 30 tablet 11  . Cholecalciferol (VITAMIN D3) 1.25 MG (50000 UT) CAPS Take 50,000 Units  by mouth once a week. 8 capsule 5  . clobetasol cream (TEMOVATE) 0.45 % Apply 1 application topically 2 (two) times daily. (Patient not taking: Reported on 12/02/2018) 30 g 0  . clopidogrel (PLAVIX) 75 MG tablet Take 1 tablet (75 mg total) by mouth daily. 30 tablet 0  . EPINEPHrine 0.3 mg/0.3 mL IJ SOAJ injection Inject 0.3 mLs (0.3 mg total) into the muscle once as needed for up to 1 dose. (Patient not taking: Reported on 12/02/2018) 1 Device 0  . fenofibrate (TRICOR) 48 MG tablet TAKE 1 TABLET BY MOUTH ONCE DAILY (Patient taking differently: Take 48 mg by mouth daily. ) 30 tablet 1  . fluticasone (FLOVENT HFA) 110 MCG/ACT inhaler Inhale 1 puff into the lungs 2 (two) times daily. (Patient taking differently: Inhale 1 puff into the lungs daily. ) 1 Inhaler 12  . gabapentin (NEURONTIN) 300 MG capsule TAKE 1 CAPSULE BY MOUTH AT BEDTIME FOR ONE WEEK  THEN TAKE ONE CAPSULE TWICE DAILY (Patient taking differently: Take 300 mg by mouth 2 (two) times daily. ) 60 capsule 5  . lisinopril (ZESTRIL) 20 MG tablet TAKE 1 TABLET BY MOUTH ONCE DAILY (Patient taking differently: Take 20 mg by mouth daily. ) 90 tablet 4  . loratadine (CLARITIN) 10 MG tablet Take 1 tablet (10 mg total) by mouth daily. (Patient not taking: Reported on 12/02/2018) 30 tablet 11  . meclizine (ANTIVERT) 12.5 MG tablet Take 1 tablet (12.5 mg total) by mouth 3 (three) times daily. 30 tablet 0  . meloxicam (MOBIC) 15 MG tablet TAKE 1 TABLET BY MOUTH ONCE DAILY (Patient taking differently: Take 15 mg by mouth daily. ) 30 tablet 0  . montelukast (SINGULAIR) 10 MG tablet TAKE 1 TABLET BY MOUTH ONCE EVERY EVENING AT BEDTIME. (Patient taking differently: Take 10 mg by mouth at bedtime. ) 30 tablet 11  . nitroGLYCERIN (NITROSTAT) 0.4 MG SL tablet Place 1 tablet (0.4 mg total) under the tongue every 5 (five) minutes as needed for chest pain. (Patient not taking: Reported on 12/02/2018) 50 tablet 3  . omeprazole (PRILOSEC) 20 MG capsule TAKE 1 CAPSULE BY  MOUTH ONCE DAILY (Patient taking differently: Take 20 mg by mouth daily. ) 30 capsule 12  . OneTouch Delica Lancets 40J MISC TEST ONCE A DAY. 100 each 3  . ONETOUCH DELICA LANCETS FINE MISC TEST once daily 100 each 3  . ONETOUCH ULTRA test strip TEST ONCE DAILY 100 each 4  . polyethylene glycol (MIRALAX) 17 g packet Take 17 g by mouth daily. 14 each 0  . PROAIR HFA 108 (90 Base) MCG/ACT inhaler INHALE 2 PUFFS BY MOUTH EVERY 6 HOURS IF NEEDED FOR COUGH OR WHEEZING. (Patient not taking: Reported on 12/02/2018) 8.5 g 3  . senna-docusate (SENOKOT-S) 8.6-50 MG tablet Take 2 tablets by mouth daily as needed for mild constipation. 30 tablet 1  Drug Regimen Review  Drug regimen was reviewed and remains appropriate with no significant issues identified  Home: Home Living Family/patient expects to be discharged to:: Private residence Living Arrangements: Spouse/significant other Available Help at Discharge: Family Type of Home: Mobile home Home Access: Ramped entrance Home Layout: One level Bathroom Shower/Tub: Chiropodist: Standard Home Equipment: Hand held shower head(reports his walker is broken)   Functional History: Prior Function Level of Independence: Independent Comments: Pt reports indep with ADL, mobility, and is primary caregiver for spouse for ADL, aide assists pt's spouse with meal prep/cleaning; 1 fall just prior to this admission  Functional Status:  Mobility: Bed Mobility Overal bed mobility: Needs Assistance Bed Mobility: Supine to Sit Supine to sit: Min guard General bed mobility comments: improved technique with no impulsiveness noted. Once seated, improved static posture; upright Transfers Overall transfer level: Needs assistance Equipment used: Rolling walker (2 wheeled) Transfers: Sit to/from Stand Sit to Stand: Min assist General transfer comment: post leaning noted. Able to self correct with cues. Ambulation/Gait Ambulation/Gait  assistance: Min assist Gait Distance (Feet): 100 Feet Assistive device: Rolling walker (2 wheeled) Gait Pattern/deviations: Step-through pattern, Decreased step length - right, Decreased step length - left General Gait Details: needs cues for attention to task as he gets easily distracted. Several LOB with R knee buckling needing mod assist to recover.Slow gait speed and fatigues with increased distance, doesn't require seated rest break this date. Gait velocity: decreased    ADL: ADL Overall ADL's : Needs assistance/impaired Toilet Transfer: BSC, Moderate assistance, Set up, RW Toilet Transfer Details (indicate cue type and reason): Pt needed to use the [facility] and was impulsive getting up and sitting at EOB and finding midline and slowing down movements to be safe before standing with mod assist with max cues due to safety and problem solving with motor planning.  He also has decreased vision in B eyes and keeps alternating opening and closing his eyes due to visual changes.  Numbness in R hand increased as he was pushing on BSC to help with stand and leaning to R to use L hand to wipe and needed mod assist for thoroughness of clean up and mod cues for safety again. General ADL Comments: fxl mobility deferred 2/2 pt declining (headache), Max A for LB ADL, Min A UB ADL  Cognition: Cognition Overall Cognitive Status: History of cognitive impairments - at baseline Orientation Level: Oriented X4 Cognition Arousal/Alertness: Awake/alert Behavior During Therapy: WFL for tasks assessed/performed Overall Cognitive Status: History of cognitive impairments - at baseline General Comments: lack of safety awareness and attention to task.    Blood pressure (!) 163/81, pulse (!) 54, temperature 98.2 F (36.8 C), resp. rate 17, height 5' 4" (1.626 m), weight 79.9 kg, SpO2 100 %. Physical Exam  Nursing note and vitals reviewed. Constitutional: He is oriented to person, place, and time. He appears  well-developed. No distress.  Obese  HENT:  Head: Normocephalic and atraumatic.  Edentulous.   Eyes: Pupils are equal, round, and reactive to light. Right eye exhibits no discharge. Left eye exhibits no discharge.  Neck: No tracheal deviation present. No thyromegaly present.  Respiratory: Effort normal. No stridor. No respiratory distress.  GI: He exhibits no distension.  Musculoskeletal:     Comments: No edema or tenderness in extremities  Neurological: He is alert and oriented to person, place, and time.  Tends to keep right eye closed due to diplopia.  Sensory deficits from right face down chest to right leg.  Ataxia with right finger to nose.  Impulsive but able to follow one step commands without difficulty.  Motor: RUE/RLE: 5-/5 proximal distal RUE/LE: 5/5 proximal distal  Skin: Skin is warm and dry. He is not diaphoretic.  Psychiatric: He has a normal mood and affect.  Slightly confused    Results for orders placed or performed during the hospital encounter of 03/02/19 (from the past 48 hour(s))  Glucose, capillary     Status: Abnormal   Collection Time: 03/04/19  5:27 PM  Result Value Ref Range   Glucose-Capillary 217 (H) 70 - 99 mg/dL   Comment 1 Notify RN    Comment 2 Document in Chart   Glucose, capillary     Status: Abnormal   Collection Time: 03/04/19  9:15 PM  Result Value Ref Range   Glucose-Capillary 213 (H) 70 - 99 mg/dL  AM Labs cbc     Status: None   Collection Time: 03/05/19  3:39 AM  Result Value Ref Range   WBC 6.5 4.0 - 10.5 K/uL   RBC 4.83 4.22 - 5.81 MIL/uL   Hemoglobin 14.7 13.0 - 17.0 g/dL   HCT 44.1 39.0 - 52.0 %   MCV 91.3 80.0 - 100.0 fL   MCH 30.4 26.0 - 34.0 pg   MCHC 33.3 30.0 - 36.0 g/dL   RDW 12.4 11.5 - 15.5 %   Platelets 159 150 - 400 K/uL   nRBC 0.0 0.0 - 0.2 %    Comment: Performed at [facility], [address] [place], [place] [address]  AM Labs bmp     Status: Abnormal   Collection Time: 03/05/19  3:39 AM  Result  Value Ref Range   Sodium 141 135 - 145 mmol/L   Potassium 3.7 3.5 - 5.1 mmol/L   Chloride 108 98 - 111 mmol/L   CO2 24 22 - 32 mmol/L   Glucose, Bld 217 (H) 70 - 99 mg/dL   BUN 12 6 - 20 mg/dL   Creatinine, Ser 1.03 0.61 - 1.24 mg/dL   Calcium 8.9 8.9 - 10.3 mg/dL   GFR calc non Af Amer >60 >60 mL/min   GFR calc Af Amer >60 >60 mL/min   Anion gap 9 5 - 15    Comment: Performed at [facility], [address] [place], [place] [address]  Glucose, capillary     Status: Abnormal   Collection Time: 03/05/19 10:37 AM  Result Value Ref Range   Glucose-Capillary 224 (H) 70 - 99 mg/dL  Glucose, capillary     Status: Abnormal   Collection Time: 03/05/19 11:54 AM  Result Value Ref Range   Glucose-Capillary 206 (H) 70 - 99 mg/dL  Glucose, capillary     Status: Abnormal   Collection Time: 03/05/19  4:44 PM  Result Value Ref Range   Glucose-Capillary 251 (H) 70 - 99 mg/dL  Glucose, capillary     Status: Abnormal   Collection Time: 03/05/19  9:03 PM  Result Value Ref Range   Glucose-Capillary 195 (H) 70 - 99 mg/dL  Glucose, capillary     Status: Abnormal   Collection Time: 03/06/19  8:03 AM  Result Value Ref Range   Glucose-Capillary 176 (H) 70 - 99 mg/dL  CBC with Differential/Platelet     Status: None   Collection Time: 03/06/19  9:00 AM  Result Value Ref Range   WBC 7.3 4.0 - 10.5 K/uL   RBC 5.14 4.22 - 5.81 MIL/uL   Hemoglobin 15.5 13.0 - 17.0 g/dL  HCT 44.8 39.0 - 52.0 %   MCV 87.2 80.0 - 100.0 fL   MCH 30.2 26.0 - 34.0 pg   MCHC 34.6 30.0 - 36.0 g/dL   RDW 12.7 11.5 - 15.5 %   Platelets 161 150 - 400 K/uL   nRBC 0.0 0.0 - 0.2 %   Neutrophils Relative % 67 %   Neutro Abs 4.9 1.7 - 7.7 K/uL   Lymphocytes Relative 23 %   Lymphs Abs 1.7 0.7 - 4.0 K/uL   Monocytes Relative 7 %   Monocytes Absolute 0.5 0.1 - 1.0 K/uL   Eosinophils Relative 2 %   Eosinophils Absolute 0.2 0.0 - 0.5 K/uL   Basophils Relative 0 %   Basophils Absolute 0.0 0.0 - 0.1 K/uL   Immature  Granulocytes 1 %   Abs Immature Granulocytes 0.04 0.00 - 0.07 K/uL    Comment: Performed at [facility], [address] [place], [place] [address]  Comprehensive metabolic panel     Status: Abnormal   Collection Time: 03/06/19  9:00 AM  Result Value Ref Range   Sodium 139 135 - 145 mmol/L   Potassium 4.0 3.5 - 5.1 mmol/L   Chloride 107 98 - 111 mmol/L   CO2 24 22 - 32 mmol/L   Glucose, Bld 196 (H) 70 - 99 mg/dL   BUN 12 6 - 20 mg/dL   Creatinine, Ser 1.08 0.61 - 1.24 mg/dL   Calcium 8.8 (L) 8.9 - 10.3 mg/dL   Total Protein 6.5 6.5 - 8.1 g/dL   Albumin 3.7 3.5 - 5.0 g/dL   AST 27 15 - 41 U/L   ALT 39 0 - 44 U/L   Alkaline Phosphatase 95 38 - 126 U/L   Total Bilirubin 0.8 0.3 - 1.2 mg/dL   GFR calc non Af Amer >60 >60 mL/min   GFR calc Af Amer >60 >60 mL/min   Anion gap 8 5 - 15    Comment: Performed at [facility], [address] [place],  [address]  Magnesium     Status: None   Collection Time: 03/06/19  9:00 AM  Result Value Ref Range   Magnesium 2.0 1.7 - 2.4 mg/dL    Comment: Performed at [facility], [address] [place],  [address]  Glucose, capillary     Status: Abnormal   Collection Time: 03/06/19 12:20 PM  Result Value Ref Range   Glucose-Capillary 216 (H) 70 - 99 mg/dL   DG Abd Portable 1V  Result Date: 03/05/2019 CLINICAL DATA:  Hypertension. Diabetes. Severe headache. Abdominal pain. EXAM: PORTABLE ABDOMEN - 1 VIEW COMPARISON:  Ultrasound 09/01/2018. FINDINGS: Soft tissue structures are unremarkable. Stool noted throughout the colon. No bowel distention or free air. Degenerative changes lumbar spine and both hips. IMPRESSION: No acute abnormality identified. Electronically Signed   By: Marcello Moores  Register   On: 03/05/2019 11:45       Medical Problem List and Plan: 1.  Decreased sensation LUE with dizziness, tangential conversation with decreased safety awareness secondary to right medullary infarct.  -patient may  may shower  -ELOS/Goals: 5-8 days/supervision  Admit to CIR 2.  Antithrombotics: -DVT/anticoagulation:  Pharmaceutical: Lovenox  -antiplatelet therapy: ASA/Plavix.  3. Pain Management: Tylenol prn.  4. Mood: LCSW to follow for evaluation and support.   -antipsychotic agents: N/A 5. Neuropsych: This patient is?  Fully capable of making decisions on his own behalf. 6. Skin/Wound Care: Routine pressure relief measures.  7. Fluids/Electrolytes/Nutrition: Monitor I/O.  CMP ordered. 8.  T2DM with hyperglycemia: Hgb A1c- 8.6--poorly controlled. Will monitor BS ac/hs will continue SSI for now.  Invokana PTA, resumed.    Monitor with increased mobility 9. HTN: Monitor BP tid--continue to hold lisinopril  Monitor with increased mobility 10. COPD: On singulair and pulmicort.  11 Dyslipidemia: Continue Lipitor and fenofibrate. 12. Phenylketonuria: Dietary modifications. 13. Nausea/vomiting: Vestibular evaluation. Will order eye patch for diplopia.  Continue phenergan prn. Will add baclofen prn for hiccups which are likely contributing factor 14. Constipation: Resolved with lactulose. Will change to senna at bedtime.    Bary Leriche, PA-C 03/06/2019  I have personally performed a face to face diagnostic evaluation, including, but not limited to relevant history and physical exam findings, of this patient and developed relevant assessment and plan.  Additionally, I have reviewed and concur with the physician assistant's documentation above.  Delice Lesch, MD, ABPMR

## 2019-03-06 NOTE — Progress Notes (Signed)
Physical Therapy Treatment Patient Details Name: [name]. MRN: [id number] DOB: 1960/07/19 Today's Date: 03/06/2019    History of Present Illness 58 y.o. male with a history of HTN, HLD and DM who reports being at baseline until coming out of the shower at 0100 this morning.  He was coming out of the shower when he developed sudden severe headache that he described as throbbing, sharp, generalized, 10 out of 10, associated with dizziness.  Patient reports that he felt like his R leg was weaker than the L and he was unable to walk straight without leaning to the right. Has been having headaches over the past 2-3 weeks without associated focal neurological symptoms. 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    PT Comments    Pt is making good progress towards goals progressing to standing activities without RW this date. Pt still struggles with upright posture reaching for UE support during dynamic movement. Requires RW for ambulation, unsteady and hands on assist for LOB, unable to self correct without therapist intervention. Pt very motivated and excited to transfer to CIR this date. Agreeable to sit in recliner at end of session. Will continue to progress as able.   Follow Up Recommendations  CIR     Equipment Recommendations  Rolling walker with 5" wheels    Recommendations for Other Services       Precautions / Restrictions Precautions Precautions: Fall Restrictions Weight Bearing Restrictions: No    Mobility  Bed Mobility Overal bed mobility: Needs Assistance Bed Mobility: Supine to Sit     Supine to sit: Min guard     General bed mobility comments: improved technique with no impulsiveness noted. Once seated, improved static posture; upright  Transfers Overall transfer level: Needs assistance Equipment used: Rolling walker (2 wheeled) Transfers: Sit to/from Stand Sit to Stand: Min assist         General transfer comment: post leaning noted.  Able to self correct with cues.  Ambulation/Gait Ambulation/Gait assistance: Min assist Gait Distance (Feet): 100 Feet Assistive device: Rolling walker (2 wheeled) Gait Pattern/deviations: Step-through pattern;Decreased step length - right;Decreased step length - left     General Gait Details: needs cues for attention to task as he gets easily distracted. Several LOB with R knee buckling needing mod assist to recover.Slow gait speed and fatigues with increased distance, doesn't require seated rest break this date.   Stairs             Wheelchair Mobility    Modified Rankin (Stroke Patients Only)       Balance Overall balance assessment: Needs assistance Sitting-balance support: Feet supported;Bilateral upper extremity supported Sitting balance-Leahy Scale: Good Sitting balance - Comments: upright posture   Standing balance support: Bilateral upper extremity supported Standing balance-Leahy Scale: Fair Standing balance comment: post leaning noted                            Cognition Arousal/Alertness: Awake/alert Behavior During Therapy: WFL for tasks assessed/performed Overall Cognitive Status: History of cognitive impairments - at baseline                                 General Comments: lack of safety awareness and attention to task.       Exercises Other Exercises Other Exercises: standing balance activities performed without AD. Mod assist required for upright posture. Activities including heel raises and  B LE toe taps outside of BOS. Able to perform 10 on each leg while reaching with UE for support on near by surface. Other Exercises: Seated ther-ex performed including B LE AP and LAQ. 15 reps with safe technique    General Comments        Pertinent Vitals/Pain Pain Assessment: No/denies pain    Home Living                      Prior Function            PT Goals (current goals can now be found in the care plan  section) Acute Rehab PT Goals Patient Stated Goal: to go to CIR today PT Goal Formulation: With patient Time For Goal Achievement: 03/17/19 Potential to Achieve Goals: Good Progress towards PT goals: Progressing toward goals    Frequency    7X/week      PT Plan Current plan remains appropriate    Co-evaluation              AM-PAC PT "6 Clicks" Mobility   Outcome Measure  Help needed turning from your back to your side while in a flat bed without using bedrails?: None Help needed moving from lying on your back to sitting on the side of a flat bed without using bedrails?: A Little Help needed moving to and from a bed to a chair (including a wheelchair)?: A Little Help needed standing up from a chair using your arms (e.g., wheelchair or bedside chair)?: A Little Help needed to walk in hospital room?: A Little Help needed climbing 3-5 steps with a railing? : A Lot 6 Click Score: 18    End of Session Equipment Utilized During Treatment: Gait belt Activity Tolerance: Patient tolerated treatment well Patient left: in chair;with call bell/phone within reach;with chair alarm set Nurse Communication: Mobility status;Other (comment) PT Visit Diagnosis: Unsteadiness on feet (R26.81);Muscle weakness (generalized) (M62.81);History of falling (Z91.81);Difficulty in walking, not elsewhere classified (R26.2)     Time: 4818-5631 PT Time Calculation (min) (ACUTE ONLY): 30 min  Charges:  $Gait Training: 8-22 mins $Therapeutic Activity: 8-22 mins                     Greggory Stallion, PT, DPT [phone]    Saher Davee 03/06/2019, 11:06 AM

## 2019-03-07 ENCOUNTER — Inpatient Hospital Stay (HOSPITAL_COMMUNITY): Payer: Medicare Other | Admitting: Physical Therapy

## 2019-03-07 ENCOUNTER — Inpatient Hospital Stay (HOSPITAL_COMMUNITY): Payer: Medicare Other | Admitting: Occupational Therapy

## 2019-03-07 DIAGNOSIS — E700 Classical phenylketonuria: Secondary | ICD-10-CM

## 2019-03-07 DIAGNOSIS — E1165 Type 2 diabetes mellitus with hyperglycemia: Secondary | ICD-10-CM

## 2019-03-07 DIAGNOSIS — E8809 Other disorders of plasma-protein metabolism, not elsewhere classified: Secondary | ICD-10-CM

## 2019-03-07 DIAGNOSIS — R112 Nausea with vomiting, unspecified: Secondary | ICD-10-CM

## 2019-03-07 DIAGNOSIS — I10 Essential (primary) hypertension: Secondary | ICD-10-CM

## 2019-03-07 DIAGNOSIS — E46 Unspecified protein-calorie malnutrition: Secondary | ICD-10-CM

## 2019-03-07 DIAGNOSIS — I6389 Other cerebral infarction: Secondary | ICD-10-CM

## 2019-03-07 DIAGNOSIS — R066 Hiccough: Secondary | ICD-10-CM

## 2019-03-07 LAB — CBC WITH DIFFERENTIAL/PLATELET
Abs Immature Granulocytes: 0.03 10*3/uL (ref 0.00–0.07)
Basophils Absolute: 0 10*3/uL (ref 0.0–0.1)
Basophils Relative: 1 %
Eosinophils Absolute: 0.1 10*3/uL (ref 0.0–0.5)
Eosinophils Relative: 2 %
HCT: 46.9 % (ref 39.0–52.0)
Hemoglobin: 15.8 g/dL (ref 13.0–17.0)
Immature Granulocytes: 0 %
Lymphocytes Relative: 27 %
Lymphs Abs: 2.2 10*3/uL (ref 0.7–4.0)
MCH: 30.9 pg (ref 26.0–34.0)
MCHC: 33.7 g/dL (ref 30.0–36.0)
MCV: 91.6 fL (ref 80.0–100.0)
Monocytes Absolute: 0.6 10*3/uL (ref 0.1–1.0)
Monocytes Relative: 7 %
Neutro Abs: 5 10*3/uL (ref 1.7–7.7)
Neutrophils Relative %: 63 %
Platelets: 177 10*3/uL (ref 150–400)
RBC: 5.12 MIL/uL (ref 4.22–5.81)
RDW: 12.6 % (ref 11.5–15.5)
WBC: 7.9 10*3/uL (ref 4.0–10.5)
nRBC: 0 % (ref 0.0–0.2)

## 2019-03-07 LAB — COMPREHENSIVE METABOLIC PANEL
ALT: 36 U/L (ref 0–44)
AST: 23 U/L (ref 15–41)
Albumin: 3.4 g/dL — ABNORMAL LOW (ref 3.5–5.0)
Alkaline Phosphatase: 89 U/L (ref 38–126)
Anion gap: 9 (ref 5–15)
BUN: 11 mg/dL (ref 6–20)
CO2: 26 mmol/L (ref 22–32)
Calcium: 9 mg/dL (ref 8.9–10.3)
Chloride: 105 mmol/L (ref 98–111)
Creatinine, Ser: 1.05 mg/dL (ref 0.61–1.24)
GFR calc Af Amer: >60 mL/min (ref >60)
GFR calc non Af Amer: >60 mL/min (ref >60)
Glucose, Bld: 199 mg/dL — ABNORMAL HIGH (ref 70–99)
Potassium: 4.2 mmol/L (ref 3.5–5.1)
Sodium: 140 mmol/L (ref 135–145)
Total Bilirubin: 0.6 mg/dL (ref 0.3–1.2)
Total Protein: 6.1 g/dL — ABNORMAL LOW (ref 6.5–8.1)

## 2019-03-07 LAB — GLUCOSE, CAPILLARY
Glucose-Capillary: 188 mg/dL — ABNORMAL HIGH (ref 70–99)
Glucose-Capillary: 128 mg/dL — ABNORMAL HIGH (ref 70–99)
Glucose-Capillary: 140 mg/dL — ABNORMAL HIGH (ref 70–99)
Glucose-Capillary: 232 mg/dL — ABNORMAL HIGH (ref 70–99)

## 2019-03-07 MED ORDER — PRO-STAT SUGAR FREE PO LIQD
30.0000 mL | Freq: Two times a day (BID) | ORAL | Status: DC
  Administered 2019-03-07 – 2019-03-13 (×11): 30 mL via ORAL
  Filled 2019-03-07 (×12): qty 30

## 2019-03-07 NOTE — Evaluation (Signed)
Physical Therapy Assessment and Plan  Patient Details  Name: [name]. MRN: [id number] Date of Birth: 11-13-1960  PT Diagnosis: Abnormality of gait, Ataxic gait, Coordination disorder, Difficulty walking, Hemiparesis non-dominant, Impaired sensation, Muscle weakness and Pain in R LE Rehab Potential: Good ELOS: 7-10 days   Today's Date: 03/07/2019 PT Individual Time: 2979-8921 and [id number] PT Individual Time Calculation (min): 59 min  And 58 min  Problem List:  Patient Active Problem List   Diagnosis Date Noted  . Stroke (cerebrum) ([place]) 03/06/2019  . Brainstem infarct, acute ([place]) 03/06/2019  . Slow transit constipation   . Nausea and vomiting   . Diplopia   . Uncontrolled type 2 diabetes mellitus with hyperglycemia ([place])   . PKU (phenylketonuria) ([place])   . CVA (cerebral vascular accident) ([place]) 03/02/2019  . Obesity 09/03/2018  . Skin tags, multiple acquired 08/23/2016  . Subacromial bursitis 08/23/2016  . Frequent falls 05/15/2016  . Vitamin D deficiency 08/22/2015  . Knee pain 08/22/2015  . Carpal tunnel syndrome on left 05/12/2015  . External hemorrhoids 12/20/2014  . Ectatic abdominal aorta ([place]) 10/26/2014  . Allergic rhinitis 10/21/2014  . Diverticulosis 10/21/2014  . Type 2 diabetes mellitus with hyperlipidemia ([place]) 10/21/2014  . Hypertension 10/21/2014  . Hyperlipidemia associated with type 2 diabetes mellitus ([place]) 10/21/2014  . Pulmonary emphysema ([place]) 10/21/2014  . GERD (gastroesophageal reflux disease) 10/21/2014  . NAFLD (nonalcoholic fatty liver disease) 10/21/2014  . 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 Arthritis 10/21/2014  . Chest pain with high risk for cardiac etiology 10/21/2014  . Abdominal pain 10/07/2014  . Elevation of level of transaminase or lactic acid dehydrogenase (LDH) 10/07/2014  . Cardiac arrhythmia 10/07/2014  . Irritable bowel syndrome 10/07/2014  . Pancreatitis 10/07/2014  . Phenylketonuria (PKU) ([place]) 10/07/2014  .  Renal cyst, left 10/07/2014  . Headache 10/07/2014  . Gall bladder polyp 10/01/2013    Past Medical History:  Past Medical History:  Diagnosis Date  . Arrhythmia   . Asthma   . Cyst of kidney, acquired   . Diabetes mellitus without complication ([place]) 1448   type 2  . GERD (gastroesophageal reflux disease)   . History of chicken pox   . History of measles as a child   . History of PKU   . Hyperlipidemia   . Hypertension   . IBS (irritable bowel syndrome)   . Irregular heart beat   . Mentally challenged   . Pancreatitis    Past Surgical History:  Past Surgical History:  Procedure Laterality Date  . Cardiac Catherization     [facility]  . CARDIAC CATHETERIZATION     [facility]  . COLONOSCOPY    . ESOPHAGOGASTRODUODENOSCOPY (EGD) WITH PROPOFOL N/A 12/27/2016   Procedure: ESOPHAGOGASTRODUODENOSCOPY (EGD) WITH PROPOFOL;  Surgeon: Lin Landsman, MD;  Location: [facility];  Service: Gastroenterology;  Laterality: N/A;  . HEMORRHOID SURGERY    . Ligament Removal Left    of left thumb: dr. Cleda Mccreedy  . ligament removal  of left thumb     Dr. Cleda Mccreedy  . NM GATED MYOCARDIAL STUDY (ARMX HX)  06/23/2014   [place]. Normal    Assessment & Plan Clinical Impression: Patient is a 58 y.o. year old male with with history of HTN, T2DM, hyperlipidemia, PKU, mentally challenged who was admitted to [facility] on 03/02/2019 with fall 3 weeks ago with subsequent headache, dizziness, weakness, right brain with ambulation.  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  CTA head/neck showed acute infarct in right  [place]  and small R-VA ending in PICA question congenital variation v/s acute distal R-VA occlusion.  Echocardiogram showed ejection fraction of 60-65% with trivial MVR.  Carotid dopplers showed patent VA and no significant ICA stenosis . Dr. Doy Mince felt that stroke was due to small vessel disease and recommended ASA/Plavix X 3 weeks followed by ASA 81 mg/daily.  Therapy evaluation revealed  decreased sensation LUE with dizziness, tangential conversation with decreased safety awareness. CIR recommended due to functional decline. Patient transferred to CIR on 03/06/2019 .   Patient currently requires min with mobility secondary to muscle weakness, decreased cardiorespiratoy endurance, unbalanced muscle activation and decreased coordination,  ,  , central origin and decreased standing balance, decreased postural control and decreased balance strategies.  Prior to hospitalization, patient was independent  with mobility and lived with Spouse in a Mobile home home.  Home access is  Ramped entrance.  Patient will benefit from skilled PT intervention to maximize safe functional mobility, minimize fall risk and decrease caregiver burden for planned discharge home with 24 hour supervision.  Anticipate patient will benefit from follow up [place] at discharge.  PT - End of Session Activity Tolerance: Tolerates 30+ min activity with multiple rests Endurance Deficit: Yes Endurance Deficit Description: requires seated rest breaks PT Assessment Rehab Potential (ACUTE/IP ONLY): Good PT Barriers to Discharge: Decreased caregiver support;Lack of/limited family support PT Patient demonstrates impairments in the following area(s): Balance;Perception;Safety;Endurance;Motor;Nutrition;Pain;Sensory;Behavior PT Transfers Functional Problem(s): Bed Mobility;Bed to Chair;Car;Furniture;Floor PT Locomotion Functional Problem(s): Ambulation;Stairs PT Plan PT Intensity: Minimum of 1-2 x/day ,45 to 90 minutes PT Frequency: 5 out of 7 days PT Duration Estimated Length of Stay: 7-10 days PT Treatment/Interventions: Community reintegration;Ambulation/gait training;DME/adaptive equipment instruction;Neuromuscular re-education;Psychosocial support;Stair training;UE/LE Strength taining/ROM;Balance/vestibular training;Discharge planning;Functional electrical stimulation;Pain management;Skin care/wound management;Therapeutic  Activities;UE/LE Coordination activities;Cognitive remediation/compensation;Disease management/prevention;Functional mobility training;Patient/family education;Splinting/orthotics;Therapeutic Exercise;Visual/perceptual remediation/compensation PT Transfers Anticipated Outcome(s): mod-I using LRAD PT Locomotion Anticipated Outcome(s): supervision using LRAD PT Recommendation Follow Up Recommendations: Home health PT;24 hour supervision/assistance Patient destination: Home Equipment Recommended: To be determined  Skilled Therapeutic Intervention Session 1: Evaluation completed (see details above and below) with education on PT POC and goals and individual treatment initiated with focus on bed mobility, transfers, activity tolerance, gait training, and stair navigation, as well as education regarding daily therapy schedule, fall precautions, weekly team meetings, purpose of PT evaluation, and other CIR information. Pt received supine in bed and eager to participate in therapy session. Patient with hyperverbosity requiring redirecting to attend to tasks. Supine>sit, HOB flat and using bedrails, with close supervision for safety. Sit<>stands, no AD, with min assist for balance due to posterior lean and increased postural sway/ataxia. Gait ~64f x2 to/from bathroom, no AD, with min assist for balance. Sit<>stand on/off toilet using grab bars with min assist for balance - pt able to perform LB clothing management and peri-care without assistance - continent of bladder and bowel with pt requiring increased time for toileting. Standing hand hygiene at sink with min assist for balance.  Transported to/from gym in w/c. Ambulated ~853f no AD, with min/mod assist for balance - pt demonstrates significantly decreased B LE step length, increased postural sway with ataxia, decreased speed of movement, and decreased trunk rotation and arm swing. Ascended/descended 12 steps using B HRs with reciprocal pattern on ascent and  step-to patten on descent with min assist for balance. Ambulated ~8038fno AD, with min/mod assist for balance back to w/c demonstrating above gait impairments. Transported back to room in w/c and stand pivot to EOB, no AD, with min  assist for balance. Sit>supine with CGA for steadying. Pt left supine in bed with needs in reach and bed alarm on.  Session 2: Pt received supine in bed and agreeable to therapy session. Supine>sit, HOB slightly elevated and using bedrails, with close supervision. Sit<>stands, no AD, with CGA/min assist for steadying/balance throughout session due to increased postural sway. Stand pivot EOB>w/c, no AD, with min assist for balance.  Transported to/from gym in w/c. Simulated ambulatory car transfer (sedan height), no AD, with min assist for balance. Ambulated ~51f up/down ramp, no AD, with min assist for balance due to increased postural sway.  Transported to/from gym in w/c. B LE strengthening via squats x13reps then x20 reps with min assist for balance. Pt continues to report having difficulty with focusing his vision, primarily in R eye, therefore therapist educated him on performing X1 gaze stabilization exercise in sitting with pt able to perform x5 R/L head rotations prior to having to stop due to "pain" in R eye and pt having more difficulty rotating head towards the R compared to the L. Dynamic standing balance task of alternate B LE foot taps on 4" step, no UE support, with min assist for balance. Progressed to stepping up/down on 4" step, no UE support, with min assist for balance. Gait training ~1066f no AD, with min/mod assist for balance as pt demonstrates increased postural sway and initially with significantly decreased B LE step length and shuffling gait improving to more reciprocal pattern upon cuing. Transported back to room in w/c and pt left seated in w/c with needs in reach and seat belt alarm on.  PT  Evaluation Precautions/Restrictions Precautions Precautions: Fall Restrictions Weight Bearing Restrictions: No Pain Pain Assessment Pain Scale: 0-10 Pain Score: ("some") Pain Type: Acute pain Pain Location: Leg Pain Orientation: Right Pain Intervention(s): Other (Comment)(exercise) Home Living/Prior Functioning Home Living Available Help at Discharge: Family Type of Home: Mobile home Home Access: Ramped entrance Home Layout: One level Bathroom Shower/Tub: TuChiropodistStandard Bathroom Accessibility: Yes  Lives With: Spouse Prior Function Level of Independence: Independent with basic ADLs;Independent with gait;Independent with transfers  Able to Take Stairs?: Yes Driving: No Vocation: On disability Comments: Pt reports indep with ADL, mobility, and is primary caregiver for spouse for ADL, aide assists pt's spouse with meal prep/cleaning; 1 fall just prior to this admission Perception  Perception Perception: Within Functional Limits Praxis Praxis: Intact  Cognition Overall Cognitive Status: History of cognitive impairments - at baseline Arousal/Alertness: Awake/alert Orientation Level: Oriented X4 Attention: Focused;Sustained Focused Attention: Appears intact Awareness: Impaired Safety/Judgment: Impaired Sensation Sensation Light Touch: Impaired Detail Peripheral sensation comments: R LE decreased light touch compared to L Light Touch Impaired Details: Impaired RLE Hot/Cold: Not tested Proprioception: Impaired by gross assessment(noted during functional mobility) Stereognosis: Not tested Coordination Gross Motor Movements are Fluid and Coordinated: No Coordination and Movement Description: gross motor movements impaired due to ataxia and increased postural sway with functional mobility Finger Nose Finger Test: mild dysmetria bilateral UEs Heel Shin Test: symmetrical billaterally Motor  Motor Motor: Ataxia;Other (comment) Motor - Skilled  Clinical Observations: impaired balance in stance  Mobility Bed Mobility Bed Mobility: Supine to Sit;Sit to Supine Supine to Sit: Supervision/Verbal cueing;Contact Guard/Touching assist Sit to Supine: Supervision/Verbal cueing;Contact Guard/Touching assist Transfers Transfers: Stand Pivot Transfers;Stand to Sit;Sit to Stand Sit to Stand: Minimal Assistance - Patient > 75% Stand to Sit: Minimal Assistance - Patient > 75% Stand Pivot Transfers: Minimal Assistance - Patient > 75% Stand Pivot Transfer Details: Tactile cues  for sequencing;Tactile cues for weight shifting;Tactile cues for posture;Tactile cues for weight beaing;Manual facilitation for weight shifting;Verbal cues for technique;Verbal cues for sequencing Transfer (Assistive device): None Locomotion  Gait Ambulation: Yes Gait Assistance: Minimal Assistance - Patient > 75%;Moderate Assistance - Patient 50-74% Gait Distance (Feet): 100 Feet Assistive device: None Gait Assistance Details: Tactile cues for weight shifting;Tactile cues for sequencing;Tactile cues for initiation;Tactile cues for posture;Tactile cues for weight beaing;Verbal cues for gait pattern;Manual facilitation for weight shifting;Manual facilitation for placement;Verbal cues for technique;Verbal cues for sequencing Gait Gait: Yes Gait Pattern: Decreased step length - left;Decreased step length - right;Decreased stance time - left;Decreased stance time - right;Decreased stride length;Poor foot clearance - right;Poor foot clearance - left;Ataxic Gait velocity: decreased Stairs / Additional Locomotion Stairs: Yes Stairs Assistance: Minimal Assistance - Patient > 75% Stair Management Technique: Two rails Number of Stairs: 12 Height of Stairs: 6 Ramp: Minimal Assistance - Patient >75% Curb: Minimal Assistance - Patient >75% Wheelchair Mobility Wheelchair Mobility: No  Trunk/Postural Assessment  Cervical Assessment Cervical Assessment: Within Functional  Limits Thoracic Assessment Thoracic Assessment: Within Functional Limits Lumbar Assessment Lumbar Assessment: Within Functional Limits Postural Control Postural Control: Deficits on evaluation Righting Reactions: delayed and inadequate Protective Responses: delayed and inadequate Postural Limitations: decreased  Balance Balance Balance Assessed: Yes Static Sitting Balance Static Sitting - Level of Assistance: 5: Stand by assistance Dynamic Sitting Balance Dynamic Sitting - Level of Assistance: 5: Stand by assistance Static Standing Balance Static Standing - Level of Assistance: 4: Min assist Dynamic Standing Balance Dynamic Standing - Level of Assistance: 4: Min assist;3: Mod assist Extremity Assessment      RLE Assessment RLE Assessment: Exceptions to [facility] Active Range of Motion (AROM) Comments: WFL General Strength Comments: grossly 4/5 throughout with 3+/5 hip flexion LLE Assessment LLE Assessment: Within Functional Limits General Strength Comments: Grossly 4+/5 assessed in supine    Refer to Care Plan for Long Term Goals  Recommendations for other services: None   Discharge Criteria: Patient will be discharged from PT if patient refuses treatment 3 consecutive times without medical reason, if treatment goals not met, if there is a change in medical status, if patient makes no progress towards goals or if patient is discharged from hospital.  The above assessment, treatment plan, treatment alternatives and goals were discussed and mutually agreed upon: by patient  Tawana Scale, PT, DPT 03/07/2019, 12:29 PM

## 2019-03-07 NOTE — Progress Notes (Signed)
[place] PHYSICAL MEDICINE & REHABILITATION PROGRESS NOTE  Subjective/Complaints: Patient seen laying in bed this morning.  He states he slept well overnight.  He is ready begin therapies today.  ROS: + Nausea, diplopia.  Denies CP, shortness of breath, diarrhea.  Objective: Vital Signs: Blood pressure (!) 151/78, pulse (!) 52, temperature (!) 97.5 F (36.4 C), resp. rate 17, SpO2 100 %. No results found. Recent Labs    03/06/19 0900 03/07/19 0552  WBC 7.3 7.9  HGB 15.5 15.8  HCT 44.8 46.9  PLT 161 177   Recent Labs    03/06/19 0900 03/07/19 0552  NA 139 140  K 4.0 4.2  CL 107 105  CO2 24 26  GLUCOSE 196* 199*  BUN 12 11  CREATININE 1.08 1.05  CALCIUM 8.8* 9.0    Physical Exam: BP (!) 151/78 (BP Location: Right Arm)   Pulse (!) 52   Temp (!) 97.5 F (36.4 C)   Resp 17   SpO2 100%  Constitutional: No distress . Vital signs reviewed.  Obese. HENT: Normocephalic.  Atraumatic. Eyes: EOMI. No discharge. Cardiovascular: No JVD. Respiratory: Normal effort.  No stridor. GI: Non-distended. Skin: Warm and dry.  Intact. Psych: Normal mood.  Normal behavior. Musc: No edema in extremities.  No tenderness in extremities. Neurological: Alert Motor: Motor: Grossly 5/5 throughout  Assessment/Plan: 1. 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  Physiatrist is providing close team supervision and 24 hour management of active medical problems listed below.  Physiatrist and rehab team continue to assess barriers to discharge/monitor patient progress toward functional and medical goals  Care Tool:  Bathing    Body parts bathed by patient: Right arm, Left arm, Chest, Abdomen, Front perineal area, Right upper leg, Left upper leg, Face   Body parts bathed by helper: Buttocks, Right lower leg, Left lower leg     Bathing assist Assist Level: Moderate  Assistance - Patient 50 - 74%     Upper Body Dressing/Undressing Upper body dressing   What is the patient wearing?: Pull over shirt    Upper body assist Assist Level: Minimal Assistance - Patient > 75%    Lower Body Dressing/Undressing Lower body dressing      What is the patient wearing?: Pants, Incontinence brief     Lower body assist Assist for lower body dressing: Moderate Assistance - Patient 50 - 74%     Toileting Toileting    Toileting assist Assist for toileting: Minimal Assistance - Patient > 75%     Transfers Chair/bed transfer  Transfers assist     Chair/bed transfer assist level: Minimal Assistance - Patient > 75%     Locomotion Ambulation   Ambulation assist      Assist level: Moderate Assistance - Patient 50 - 74% Assistive device: No Device Max distance: 150f   Walk 10 feet activity   Assist     Assist level: Minimal Assistance - Patient > 75% Assistive device: No Device   Walk 50 feet activity   Assist    Assist level: Moderate Assistance - Patient - 50 - 74% Assistive device: No Device    Walk 150 feet activity   Assist Walk 150 feet activity did not occur: Safety/medical concerns         Walk 10 feet on uneven surface  activity   Assist     Assist level: Minimal Assistance - Patient > 75%  Wheelchair     Assist Will patient use wheelchair at discharge?: No             Wheelchair 50 feet with 2 turns activity    Assist            Wheelchair 150 feet activity     Assist            Medical Problem List and Plan: 1.  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  Begin CIR evaluations  2.  Antithrombotics: -DVT/anticoagulation:  Pharmaceutical: Lovenox             -antiplatelet therapy: ASA/Plavix.  3. Pain Management: Tylenol prn.  4. Mood: LCSW to follow for evaluation  and support.              -antipsychotic agents: N/A 5. Neuropsych: This patient is?  Fully capable of making decisions on his own behalf. 6. Skin/Wound Care: Routine pressure relief measures.  7. Fluids/Electrolytes/Nutrition: Monitor I/O.  CMP ordered. 8. T2DM with hyperglycemia: Hgb A1c- 8.6--poorly controlled. Will monitor BS ac/hs will continue SSI for now.  Invokana PTA, resumed.               Monitor with increased mobility 9. HTN: Monitor BP tid--continue to hold lisinopril             Monitor with increased mobility 10. COPD: On singulair and pulmicort.  11. Dyslipidemia: Continue Lipitor and fenofibrate. 12. Phenylketonuria: Dietary modifications ordered.  Intellectual disability 13. Nausea/vomiting: Vestibular evaluation.  Eyepatch patch for diplopia.  Continue phenergan prn.  14.  Hiccups:  Improving 15. Constipation: Resolved with lactulose.  Senna at bedtime.  16.  Hypoalbuminemia  Supplement initiated on 12/19  LOS: 1 days A FACE TO FACE EVALUATION WAS PERFORMED  Andrews Tener Lorie Phenix 03/07/2019, 6:58 PM

## 2019-03-07 NOTE — Evaluation (Signed)
Occupational Therapy Assessment and Plan  Patient Details  Name: [name]. MRN: [id number] Date of Birth: 1961-02-14  OT Diagnosis: ataxia, disturbance of vision and muscle weakness (generalized) Rehab Potential: Rehab Potential (ACUTE ONLY): Good ELOS: 5-7 days   Today's Date: 03/07/2019 OT Individual Time: 5573-2202 OT Individual Time Calculation (min): 72 min     Problem List:  Patient Active Problem List   Diagnosis Date Noted  . Stroke (cerebrum) ([place]) 03/06/2019  . Brainstem infarct, acute ([place]) 03/06/2019  . Slow transit constipation   . Nausea and vomiting   . Diplopia   . Uncontrolled type 2 diabetes mellitus with hyperglycemia ([place])   . PKU (phenylketonuria) ([place])   . CVA (cerebral vascular accident) ([place]) 03/02/2019  . Obesity 09/03/2018  . Skin tags, multiple acquired 08/23/2016  . Subacromial bursitis 08/23/2016  . Frequent falls 05/15/2016  . Vitamin D deficiency 08/22/2015  . Knee pain 08/22/2015  . Carpal tunnel syndrome on left 05/12/2015  . External hemorrhoids 12/20/2014  . Ectatic abdominal aorta ([place]) 10/26/2014  . Allergic rhinitis 10/21/2014  . Diverticulosis 10/21/2014  . Type 2 diabetes mellitus with hyperlipidemia ([place]) 10/21/2014  . Hypertension 10/21/2014  . Hyperlipidemia associated with type 2 diabetes mellitus ([place]) 10/21/2014  . Pulmonary emphysema ([place]) 10/21/2014  . GERD (gastroesophageal reflux disease) 10/21/2014  . NAFLD (nonalcoholic fatty liver disease) 10/21/2014  . 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 Arthritis 10/21/2014  . Chest pain with high risk for cardiac etiology 10/21/2014  . Abdominal pain 10/07/2014  . Elevation of level of transaminase or lactic acid dehydrogenase (LDH) 10/07/2014  . Cardiac arrhythmia 10/07/2014  . Irritable bowel syndrome 10/07/2014  . Pancreatitis 10/07/2014  . Phenylketonuria (PKU) ([place]) 10/07/2014  . Renal cyst, left 10/07/2014  . Headache 10/07/2014  . Gall bladder polyp 10/01/2013     Past Medical History:  Past Medical History:  Diagnosis Date  . Arrhythmia   . Asthma   . Cyst of kidney, acquired   . Diabetes mellitus without complication ([place]) 5427   type 2  . GERD (gastroesophageal reflux disease)   . History of chicken pox   . History of measles as a child   . History of PKU   . Hyperlipidemia   . Hypertension   . IBS (irritable bowel syndrome)   . Irregular heart beat   . Mentally challenged   . Pancreatitis    Past Surgical History:  Past Surgical History:  Procedure Laterality Date  . Cardiac Catherization     [facility]  . CARDIAC CATHETERIZATION     [facility]  . COLONOSCOPY    . ESOPHAGOGASTRODUODENOSCOPY (EGD) WITH PROPOFOL N/A 12/27/2016   Procedure: ESOPHAGOGASTRODUODENOSCOPY (EGD) WITH PROPOFOL;  Surgeon: Lin Landsman, MD;  Location: [place];  Service: Gastroenterology;  Laterality: N/A;  . HEMORRHOID SURGERY    . Ligament Removal Left    of left thumb: dr. Cleda Mccreedy  . ligament removal  of left thumb     Dr. Cleda Mccreedy  . NM GATED MYOCARDIAL STUDY (ARMX HX)  06/23/2014   [place]. Normal    Assessment & Plan Clinical Impression: Patient is a 58 y.o. year old male with history of HTN, T2DM, hyperlipidemia, PKU, mentally challenged who was admitted to [facility] on 03/02/2019 with fall 3 weeks ago with subsequent headache, dizziness, weakness, right brain with ambulation.  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  CTA head/neck showed acute infarct in right  [place] and small R-VA ending in PICA question congenital variation v/s acute distal  R-VA occlusion.  Echocardiogram showed ejection fraction of 60-65% with trivial MVR.  Carotid dopplers showed patent VA and no significant ICA stenosis . Dr. Doy Mince felt that stroke was due to small vessel disease and recommended ASA/Plavix X 3 weeks followed by ASA 81 mg/daily.  Therapy evaluation revealed decreased sensation LUE with dizziness, tangential conversation with decreased safety  awareness.   Patient transferred to CIR on 03/06/2019 .    Patient currently requires min with basic self-care skills and IADL secondary to ataxia and decreased coordination, decreased visual motor skills, central origin and decreased sitting balance, decreased standing balance, decreased postural control and decreased balance strategies.  Prior to hospitalization, patient could complete adl with independent .  Patient will benefit from skilled intervention to decrease level of assist with basic self-care skills, increase independence with basic self-care skills and increase level of independence with iADL prior to discharge home with care partner.  Anticipate patient will require intermittent supervision and follow up home health.  OT - End of Session Activity Tolerance: Tolerates 30+ min activity without fatigue Endurance Deficit: Yes Endurance Deficit Description: fatigue with adl tasks OT Assessment Rehab Potential (ACUTE ONLY): Good OT Patient demonstrates impairments in the following area(s): Balance;Endurance;Motor;Safety;Vision OT Basic ADL's Functional Problem(s): Grooming;Bathing;Dressing;Toileting OT Advanced ADL's Functional Problem(s): Simple Meal Preparation;Light Housekeeping OT Transfers Functional Problem(s): Toilet;Tub/Shower OT Plan OT Intensity: Minimum of 1-2 x/day, 45 to 90 minutes OT Frequency: 5 out of 7 days OT Duration/Estimated Length of Stay: 5-7 days OT Treatment/Interventions: Balance/vestibular training;Neuromuscular re-education;Self Care/advanced ADL retraining;Therapeutic Exercise;DME/adaptive equipment instruction;Community reintegration;Patient/family education;UE/LE Coordination activities;Discharge planning;Functional mobility training;Therapeutic Activities;Visual/perceptual remediation/compensation OT Self Feeding Anticipated Outcome(s): independent OT Basic Self-Care Anticipated Outcome(s): mod I OT Toileting Anticipated Outcome(s): mod I OT Bathroom  Transfers Anticipated Outcome(s): supervision OT Recommendation Patient destination: Home Follow Up Recommendations: Home health OT Equipment Details: owns shower seat   Skilled Therapeutic Intervention Patient seated in bed. He is alert, pleasant and cooperative.  Evaluation completed as documented below.  Reviewed role of OT, safety with mobility and self care, DME, plan of care, goals for therapy and alarm/call bell system.  He presents with impaired recall of specific items and general concepts but demonstrates a good understanding of concrete terms/rules and plan.  Visual motor, balance and coordination impairments limit his current ability to complete adl and iadl tasks without assistance.  Completed functional ambulation in room with RW min A level, min a for functional transfers to/from toilet, shower bench, bed and w/c.  He completed shower seated on bench with mod a overall, UB dressing with set up, LB dressing with min A.  He is a good candidate for IP rehab, anticipate some S needs post discharge and plan for physical assistance for wife that he was completing prior to CVA.  He sat in recliner at close of session, seat alarm set and call bell in hand.    OT Evaluation Precautions/Restrictions  Precautions Precautions: Fall Restrictions Weight Bearing Restrictions: No General   Vital Signs Therapy Vitals Temp: (!) 97.5 F (36.4 C) Pulse Rate: (!) 52 Resp: 17 BP: (!) 151/78 Patient Position (if appropriate): Sitting Oxygen Therapy SpO2: 100 % O2 Device: Room Air Pain Pain Assessment Pain Scale: 0-10 Pain Score: 0-No pain Pain Type: Acute pain Pain Location: Leg Pain Orientation: Right Pain Intervention(s): Other (Comment)(exercise) Home Living/Prior [place] expects to be discharged to:: Private residence Living Arrangements: Spouse/significant other Available Help at Discharge: Family Type of Home: Mobile home Home Access: Ramped  entrance [place]:  One level Bathroom Shower/Tub: Optometrist: Yes  Lives With: Spouse Prior Function Level of Independence: Independent with basic ADLs, Independent with gait, Independent with transfers  Able to Take Stairs?: Yes Driving: No Vocation: On disability Comments: Pt reports indep with ADL, mobility, and is primary caregiver for spouse for ADL, aide assists pt's spouse with meal prep/cleaning; 1 fall just prior to this admission ADL ADL Eating: Set up Where Assessed-Eating: Bed level Grooming: Supervision/safety Where Assessed-Grooming: Sitting at sink Upper Body Bathing: Supervision/safety Where Assessed-Upper Body Bathing: Shower Lower Body Bathing: Moderate assistance Where Assessed-Lower Body Bathing: Shower Upper Body Dressing: Supervision/safety Where Assessed-Upper Body Dressing: Chair Lower Body Dressing: Minimal assistance Where Assessed-Lower Body Dressing: Chair Toileting: Minimal assistance Where Assessed-Toileting: Glass blower/designer: Psychiatric nurse Method: Counselling psychologist: Energy manager: Environmental education officer Method: Heritage manager: Radio broadcast assistant, Grab bars ADL Comments: cues for safety Vision Baseline Vision/History: Wears glasses Wears Glasses: At all times(trifocals, not currently available) Patient Visual Report: Blurring of vision;Other (comment)(patient notes occ double vision, not present during evaluation, moderate blinking with conversation, less noted with structured tasks) Vision Assessment?: Yes Eye Alignment: Within Functional Limits Ocular Range of Motion: Within Functional Limits Alignment/Gaze Preference: Within Defined Limits Tracking/Visual Pursuits: Decreased smoothness of horizontal tracking;Decreased smoothness of vertical tracking Saccades: Within functional  limits Convergence: Within functional limits Visual Fields: No apparent deficits Additional Comments: with instruction to focus both eyes on his target, patient is able to complete coordination tasks, read name badge and focus on items for self care - he notes some strain but is able to maintain Perception  Perception: Within Functional Limits Praxis Praxis: Intact Cognition Overall Cognitive Status: History of cognitive impairments - at baseline Arousal/Alertness: Awake/alert Orientation Level: Person;Place;Situation Person: Oriented Place: Oriented Situation: Oriented Year: 2020 Month: December Day of Week: Correct Memory: Impaired Memory Impairment: Decreased recall of new information;Decreased short term memory Decreased Short Term Memory: Functional basic;Verbal basic Immediate Memory Recall: Sock;Blue;Bed Memory Recall Sock: Not able to recall Memory Recall Blue: Not able to recall Memory Recall Bed: Not able to recall Attention: Focused;Sustained Focused Attention: Appears intact Awareness: Impaired Awareness Impairment: Intellectual impairment Problem Solving: Impaired Problem Solving Impairment: Functional complex Safety/Judgment: Impaired Sensation Sensation Light Touch: Appears Intact Proprioception: Appears Intact Coordination Finger Nose Finger Test: mild dysmetria bilateral UEs 9 Hole Peg Test: L = 37 sec, R = 50 sec     box and blocks:   L = 45, R = 44 Motor  Motor Motor - Skilled Clinical Observations: impaired balance in stance Mobility  Bed Mobility Bed Mobility: Supine to Sit;Sit to Supine Supine to Sit: Minimal Assistance - Patient > 75% Sit to Supine: Minimal Assistance - Patient > 75% Transfers Sit to Stand: Minimal Assistance - Patient > 75%  Trunk/Postural Assessment  Cervical Assessment Cervical Assessment: Within Functional Limits Thoracic Assessment Thoracic Assessment: Within Functional Limits Lumbar Assessment Lumbar Assessment:  Within Functional Limits  Balance Balance Balance Assessed: Yes Static Sitting Balance Static Sitting - Level of Assistance: 5: Stand by assistance Dynamic Sitting Balance Dynamic Sitting - Level of Assistance: 5: Stand by assistance Static Standing Balance Static Standing - Level of Assistance: 4: Min assist Dynamic Standing Balance Dynamic Standing - Level of Assistance: 3: Mod assist Extremity/Trunk Assessment RUE Assessment Active Range of Motion (AROM) Comments: WFL General Strength Comments: 4/5 LUE Assessment Active Range of Motion (AROM) Comments: WFL General Strength Comments: 4/5  Refer to Care Plan for Long Term Goals  Recommendations for other services: None    Discharge Criteria: Patient will be discharged from OT if patient refuses treatment 3 consecutive times without medical reason, if treatment goals not met, if there is a change in medical status, if patient makes no progress towards goals or if patient is discharged from hospital.  The above assessment, treatment plan, treatment alternatives and goals were discussed and mutually agreed upon: by patient  Carlos Levering 03/07/2019, 1:32 PM

## 2019-03-08 ENCOUNTER — Inpatient Hospital Stay (HOSPITAL_COMMUNITY): Payer: Medicare Other

## 2019-03-08 ENCOUNTER — Inpatient Hospital Stay (HOSPITAL_COMMUNITY): Payer: Medicare Other | Admitting: Occupational Therapy

## 2019-03-08 DIAGNOSIS — I10 Essential (primary) hypertension: Secondary | ICD-10-CM

## 2019-03-08 LAB — GLUCOSE, CAPILLARY
Glucose-Capillary: 144 mg/dL — ABNORMAL HIGH (ref 70–99)
Glucose-Capillary: 192 mg/dL — ABNORMAL HIGH (ref 70–99)
Glucose-Capillary: 142 mg/dL — ABNORMAL HIGH (ref 70–99)
Glucose-Capillary: 256 mg/dL — ABNORMAL HIGH (ref 70–99)

## 2019-03-08 NOTE — Plan of Care (Signed)
  Problem: Consults Goal: RH STROKE PATIENT EDUCATION Description: See Patient Education module for education specifics  Outcome: Progressing   Problem: RH BOWEL ELIMINATION Goal: RH STG MANAGE BOWEL WITH ASSISTANCE Description: STG Manage Bowel with min Assistance. Outcome: Progressing Goal: RH STG MANAGE BOWEL W/MEDICATION W/ASSISTANCE Description: STG Manage Bowel with Medication with  mod I Assistance. Outcome: Progressing   Problem: RH BLADDER ELIMINATION Goal: RH STG MANAGE BLADDER WITH ASSISTANCE Description: STG Manage Bladder With min Assistance Outcome: Progressing   Problem: RH SAFETY Goal: RH STG ADHERE TO SAFETY PRECAUTIONS W/ASSISTANCE/DEVICE Description: STG Adhere to Safety Precautions With cues/reminders Assistance/Device. Outcome: Progressing   Problem: RH PAIN MANAGEMENT Goal: RH STG PAIN MANAGED AT OR BELOW PT'S PAIN GOAL Description: Pain at or below level 5 Outcome: Progressing   Problem: RH KNOWLEDGE DEFICIT Goal: RH STG INCREASE KNOWLEDGE OF DIABETES Description: Patient will be able to manage DM using medications, dietary restrictions and monitor CBGs , treat results using handouts and educational information with cues/reminders Outcome: Progressing Goal: RH STG INCREASE KNOWLEDGE OF HYPERTENSION Description: Patient will be able to manage HTN using medications, dietary restrictions  using educational information with cues/reminders Outcome: Progressing Goal: RH STG INCREASE KNOWLEGDE OF HYPERLIPIDEMIA Description: Patient will be able to manage DM using medications, dietary restrictions using educational information with cues/reminders Outcome: Progressing Goal: RH STG INCREASE KNOWLEDGE OF STROKE PROPHYLAXIS Description: Patient will be able to manage secondary stroke prevention using medications, dietary restrictions using handouts and educational information with cues/reminders Outcome: Progressing   Problem: RH Vision Goal: RH LTG Vision  (Specify) Outcome: Progressing

## 2019-03-08 NOTE — Progress Notes (Signed)
Physical Therapy Session Note  Patient Details  Name: [name]. MRN: [id number] Date of Birth: 1960/08/06  Today's Date: 03/08/2019 PT Individual Time: [phone] and 1530-1640 PT Individual Time Calculation (min): 55 min  and 70 min   Short Term Goals: Week 1:  PT Short Term Goal 1 (Week 1): = to LTGs based on ELOS  Skilled Therapeutic Interventions/Progress Updates:    Session 1: Pt seated in w/c upon PT arrival, agreeable to therapy tx and denies pain. Pt reports "the doctor said I wouldn't have therapy today," Therapist explained that pt does have therapies today. Encouragement for participation today. Pt performed sit<>stand with CGA and ambulated 2 x 200 ft from room<>gym this session with RW and CGA, cues for upright posture, increased R step length/foot clearance and safety. Pt participated in [place] this session as detailed below, scored 30/56 and discussed these results with the patient. Pt worked on dynamic standing balance this session to perform the following tasks: ambulation foreards without AD 2 x 50 ft min assist, backwards ambulation 2 x 30 ft without AD min assist, sidestepping using the rail for UE support 2 x 30 ft in each direction CGA- min assist, all with cues for safety/awareness. Pt worked on dynamic balance and coordination to perform toe taps on step without UE support, min assist. Therapist gave pt eye patch this session and also gave pt lower height RW for better fit/positioning. Pt left in w/c at end of session with needs in reach and chair alarm set.   Session 2: Pt supine in bed upon PT arrival, agreeable to therapy tx and denies pain. Pt transferred to sitting EOB with supervision and ambulated to the dayroom x150 ft with RW and CGA. Pt used nustep this session x 10 minutes on workload 6 for global strengthening and reciprocal extremity movements. Pt ambulated to the gym with RW and CGA x 200 ft, pt reports he feels like he can take bigger  steps now, pt with noted improved step length. Pt worked on dynamic standing balance without UE support this session to perform the following tasks: forwards/backwards ambulation without AD 2 x 30 ft in each directions (improved step length compared to this morning), tandem walking within parallel bars, tandem stance while performing horizontal head turns and then vertical head turns, standing on foam while completeing PVC pipe tree puzzle (increased time to complete), and toe taps on 6 inch step. Pt ambulated to the steps and ascended/descended 12 steps with B rails and min assist, reciprocal pattern for LE strengthening. Pt ambulated back to room and ambulated into bathroom, standing balance CGA while using bathroom, continent of bladder, washed hands at sink. Transferred to bed and left supine with needs in reach and bed alarm set.   Therapy Documentation Precautions:  Precautions Precautions: Fall Restrictions Weight Bearing Restrictions: No   Balance Balance Balance Assessed: Yes Standardized Balance Assessment Standardized Balance Assessment: Berg Balance Test Berg Balance Test Sit to Stand: Able to stand without using hands and stabilize independently Standing Unsupported: Able to stand 2 minutes with supervision Sitting with Back Unsupported but Feet Supported on Floor or Stool: Able to sit safely and securely 2 minutes Stand to Sit: Sits safely with minimal use of hands Transfers: Able to transfer safely, definite need of hands Standing Unsupported with Eyes Closed: Able to stand 10 seconds with supervision Standing Ubsupported with Feet Together: Needs help to attain position and unable to hold for 15 seconds From Standing, Reach Forward with  Outstretched Arm: Reaches forward but needs supervision From Standing Position, Pick up Object from Floor: Able to pick up shoe, needs supervision From Standing Position, Turn to Look Behind Over each Shoulder: Looks behind one side only/other  side shows less weight shift Turn 360 Degrees: Needs assistance while turning Standing Unsupported, Alternately Place Feet on Step/Stool: Able to complete >2 steps/needs minimal assist Standing Unsupported, One Foot in Front: Loses balance while stepping or standing Standing on One Leg: Tries to lift leg/unable to hold 3 seconds but remains standing independently Total Score: 30    Therapy/Group: Individual Therapy  Netta Corrigan, PT, DPT, CSRS 03/08/2019, 7:53 AM

## 2019-03-08 NOTE — Progress Notes (Signed)
Occupational Therapy Session Note  Patient Details  Name: [name]. MRN: [id number] Date of Birth: September 30, 1960  Today's Date: 03/08/2019 OT Individual Time: 1045-1140 OT Individual Time Calculation (min): 55 min    Short Term Goals: Week 1:  OT Short Term Goal 1 (Week 1): NA STG = LTG  Skilled Therapeutic Interventions/Progress Updates:    Pt seen for OT ADL bathing/dressing session. Pt received sitting on toilet with hand off from NT, extended time required for BM. Pt requesting to shower.  Completed toileting task with steadying assist following void. Trnaisitioned to shower with close supervision using RW and VCs required for RW management in functional context.  Bathed seated on TTB with guarding assist overall, VCs to attend to all areas and attention to task. Pt is hyperverbal and easily distracted by internal and external factors. He returned to standard chair to dress. VCs required for attenion to task, pt attempting to stand to pull pants up with only having one LE threaded into pants and VCs for awareness to clothing orientation as placing pants on backwards.  UB dressing completed with set-up. He completed oral care standing with RW at sink.  Throughout session, pt required VCs for safety awareness and RW management. He displays poor insight into deficits, not understanding why he was unable to shower alone and why chair belt alarm was required. Attempted education regarding pt's high fall risk and need for assist at this time. With encouragement, pt willing to stay sitting up in w/c to eat lunch. Pt left in w/c with chair belt alarm on, all needs in reach and NT entering. Education provided throughout session regarding role of OT, PT/OT goals, POC, activity progression, CVA recovery and d/c planning.   Therapy Documentation Precautions:  Precautions Precautions: Fall Restrictions Weight Bearing Restrictions: No   Therapy/Group: Individual Therapy  Kamonte Mcmichen,  Sula Fetterly L 03/08/2019, 6:38 AM

## 2019-03-08 NOTE — Progress Notes (Signed)
[place] PHYSICAL MEDICINE & REHABILITATION PROGRESS NOTE  Subjective/Complaints: Patient seen sitting up with both of his bed and then transferring to the wheelchair.  He states he slept well overnight.  He states he had a good first day of therapies yesterday.  Noted to be slightly impulsive.  He states he is still dizzy.  Educated on course of stroke recovery.  He does note some improvement in diplopia.  ROS: + Dizziness.  Denies CP, shortness of breath, nausea, vomiting diarrhea.  Objective: Vital Signs: Blood pressure 119/83, pulse (!) 59, temperature (!) 97.4 F (36.3 C), resp. rate 16, SpO2 99 %. No results found. Recent Labs    03/06/19 0900 03/07/19 0552  WBC 7.3 7.9  HGB 15.5 15.8  HCT 44.8 46.9  PLT 161 177   Recent Labs    03/06/19 0900 03/07/19 0552  NA 139 140  K 4.0 4.2  CL 107 105  CO2 24 26  GLUCOSE 196* 199*  BUN 12 11  CREATININE 1.08 1.05  CALCIUM 8.8* 9.0    Physical Exam: BP 119/83 (BP Location: Right Arm)   Pulse (!) 59   Temp (!) 97.4 F (36.3 C)   Resp 16   SpO2 99%  Constitutional: No distress . Vital signs reviewed.  Obese. HENT: Normocephalic.  Atraumatic. Eyes: EOMI. No discharge. Cardiovascular: No JVD. Respiratory: Normal effort.  No stridor. GI: Non-distended. Skin: Warm and dry.  Intact. Psych: Normal mood.  Normal behavior. Musc: No edema in extremities.  No tenderness in extremities. Neurological: Alert Motor: Motor: Grossly 5/5 throughout  Assessment/Plan: 1. 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  Physiatrist is providing close team supervision and 24 hour management of active medical problems listed below.  Physiatrist and rehab team continue to assess barriers to discharge/monitor patient progress toward functional and medical goals  Care Tool:  Bathing    Body parts bathed by patient:  Right arm, Left arm, Chest, Abdomen, Front perineal area, Right upper leg, Left upper leg, Face, Buttocks, Right lower leg, Left lower leg   Body parts bathed by helper: Buttocks, Right lower leg, Left lower leg     Bathing assist Assist Level: Contact Guard/Touching assist     Upper Body Dressing/Undressing Upper body dressing   What is the patient wearing?: Pull over shirt    Upper body assist Assist Level: Supervision/Verbal cueing    Lower Body Dressing/Undressing Lower body dressing      What is the patient wearing?: Pants, Incontinence brief     Lower body assist Assist for lower body dressing: Supervision/Verbal cueing     Toileting Toileting    Toileting assist Assist for toileting: Contact Guard/Touching assist     Transfers Chair/bed transfer  Transfers assist     Chair/bed transfer assist level: Contact Guard/Touching assist     Locomotion Ambulation   Ambulation assist      Assist level: Contact Guard/Touching assist Assistive device: Walker-rolling Max distance: 200 ft   Walk 10 feet activity   Assist     Assist level: Contact Guard/Touching assist Assistive device: Walker-rolling   Walk 50 feet activity   Assist    Assist level: Contact Guard/Touching assist Assistive device: Walker-rolling    Walk 150 feet activity   Assist Walk 150 feet activity did not occur: Safety/medical concerns  Assist level: Contact Guard/Touching assist Assistive device: Walker-rolling    Walk 10 feet on uneven surface  activity   Assist     Assist level: Minimal Assistance - Patient > 75%     Wheelchair     Assist Will patient use wheelchair at discharge?: No             Wheelchair 50 feet with 2 turns activity    Assist            Wheelchair 150 feet activity     Assist            Medical Problem List and Plan: 1.  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  Continue CIR 2.  Antithrombotics: -DVT/anticoagulation:  Pharmaceutical: Lovenox             -antiplatelet therapy: ASA/Plavix.  3. Pain Management: Tylenol prn.  4. Mood: LCSW to follow for evaluation and support.              -antipsychotic agents: N/A 5. Neuropsych: This patient is?  Fully capable of making decisions on his own behalf. 6. Skin/Wound Care: Routine pressure relief measures.  7. Fluids/Electrolytes/Nutrition: Monitor I/O.  CMP ordered. 8. T2DM with hyperglycemia: Hgb A1c- 8.6--poorly controlled. Will monitor BS ac/hs will continue SSI for now.  Invokana PTA, resumed.               Monitor with increased mobility 9. HTN: Monitor BP tid--continue to hold lisinopril             Monitor with increased mobility 10. COPD: On singulair and pulmicort.  11. Dyslipidemia: Continue Lipitor and fenofibrate. 12. Phenylketonuria: Dietary modifications ordered.  Intellectual disability 13. Nausea/vomiting: Vestibular evaluation.    Eyepatch patch for diplopia if necessary.     Continue phenergan prn.   Improving 14.  Hiccups:  Improved 15. Constipation: Resolved with lactulose.  Senna at bedtime.   Improving 16.  Hypoalbuminemia  Supplement initiated on 12/19  LOS: 2 days A FACE TO FACE EVALUATION WAS PERFORMED  [name] [name] 03/08/2019, 2:26 PM

## 2019-03-09 ENCOUNTER — Inpatient Hospital Stay (HOSPITAL_COMMUNITY): Payer: Medicare Other | Admitting: Occupational Therapy

## 2019-03-09 ENCOUNTER — Inpatient Hospital Stay (HOSPITAL_COMMUNITY): Payer: Medicare Other

## 2019-03-09 LAB — GLUCOSE, CAPILLARY
Glucose-Capillary: 147 mg/dL — ABNORMAL HIGH (ref 70–99)
Glucose-Capillary: 193 mg/dL — ABNORMAL HIGH (ref 70–99)
Glucose-Capillary: 182 mg/dL — ABNORMAL HIGH (ref 70–99)
Glucose-Capillary: 176 mg/dL — ABNORMAL HIGH (ref 70–99)

## 2019-03-09 LAB — BASIC METABOLIC PANEL
Anion gap: 11 (ref 5–15)
BUN: 21 mg/dL — ABNORMAL HIGH (ref 6–20)
CO2: 23 mmol/L (ref 22–32)
Calcium: 9.3 mg/dL (ref 8.9–10.3)
Chloride: 104 mmol/L (ref 98–111)
Creatinine, Ser: 1.19 mg/dL (ref 0.61–1.24)
GFR calc Af Amer: >60 mL/min (ref >60)
GFR calc non Af Amer: >60 mL/min (ref >60)
Glucose, Bld: 157 mg/dL — ABNORMAL HIGH (ref 70–99)
Potassium: 4.3 mmol/L (ref 3.5–5.1)
Sodium: 138 mmol/L (ref 135–145)

## 2019-03-09 LAB — CBC
HCT: 48.3 % (ref 39.0–52.0)
Hemoglobin: 16.2 g/dL (ref 13.0–17.0)
MCH: 30.8 pg (ref 26.0–34.0)
MCHC: 33.5 g/dL (ref 30.0–36.0)
MCV: 91.8 fL (ref 80.0–100.0)
Platelets: 189 10*3/uL (ref 150–400)
RBC: 5.26 MIL/uL (ref 4.22–5.81)
RDW: 12.7 % (ref 11.5–15.5)
WBC: 6.9 10*3/uL (ref 4.0–10.5)
nRBC: 0 % (ref 0.0–0.2)

## 2019-03-09 NOTE — IPOC Note (Signed)
Overall Plan of Care (IPOC) Patient Details Name: [name]. MRN: [id number] DOB: December 07, 1960  Admitting Diagnosis: Brainstem infarct, acute [facility])  Hospital Problems: Principal Problem:   Brainstem infarct, acute ([place]) Active Problems:   Stroke (cerebrum) ([place])   Hypoalbuminemia due to protein-calorie malnutrition (HCC)   Hiccups   Benign essential HTN     Functional Problem List: Nursing Bowel, Bladder, Pain, Medication Management, Endurance, Safety  PT Balance, Perception, Safety, Endurance, Motor, Nutrition, Pain, Sensory, Behavior  OT Balance, Endurance, Motor, Safety, Vision  SLP    TR         Basic ADL's: OT Grooming, Bathing, Dressing, Toileting     Advanced  ADL's: OT Simple Meal Preparation, Light Housekeeping     Transfers: PT Bed Mobility, Bed to Chair, Car, Furniture, Floor  OT Toilet, Tub/Shower     Locomotion: PT Ambulation, Stairs     Additional Impairments: OT    SLP        TR      Anticipated Outcomes Item Anticipated Outcome  Self Feeding independent  Swallowing      Basic self-care  mod I  Toileting  mod I   Bathroom Transfers supervision  Bowel/Bladder  with min assist  Transfers  mod-I using LRAD  Locomotion  supervision using LRAD  Communication     Cognition     Pain  pain at or below level 5  Safety/Judgment  manage safety with cues/reminders   Therapy Plan: PT Intensity: Minimum of 1-2 x/day ,45 to 90 minutes PT Frequency: 5 out of 7 days PT Duration Estimated Length of Stay: 7-10 days OT Intensity: Minimum of 1-2 x/day, 45 to 90 minutes OT Frequency: 5 out of 7 days OT Duration/Estimated Length of Stay: 5-7 days     Due to the current state of emergency, patients may not be receiving their 3-hours of Medicare-mandated therapy.   Team Interventions: Nursing Interventions Patient/Family Education, Bladder Management, Bowel Management, Disease Management/Prevention, Medication Management, Discharge  Planning  PT interventions Community reintegration, Ambulation/gait training, DME/adaptive equipment instruction, Neuromuscular re-education, Psychosocial support, Stair training, UE/LE Strength taining/ROM, Training and development officer, Discharge planning, Functional electrical stimulation, Pain management, Skin care/wound management, Therapeutic Activities, UE/LE Coordination activities, Cognitive remediation/compensation, Disease management/prevention, Functional mobility training, Patient/family education, Splinting/orthotics, Therapeutic Exercise, Visual/perceptual remediation/compensation  OT Interventions Balance/vestibular training, Neuromuscular re-education, Self Care/advanced ADL retraining, Therapeutic Exercise, DME/adaptive equipment instruction, Community reintegration, Patient/family education, UE/LE Coordination activities, Discharge planning, Functional mobility training, Therapeutic Activities, Visual/perceptual remediation/compensation  SLP Interventions    TR Interventions    SW/CM Interventions     Barriers to Discharge MD  Medical stability  Nursing      PT Decreased caregiver support, Lack of/limited family support    OT      SLP      SW       Team Discharge Planning: Destination: PT-Home ,OT- Home , SLP-  Projected Follow-up: PT-Home health PT, 24 hour supervision/assistance, OT-  Home health OT, SLP-  Projected Equipment Needs: PT-To be determined, OT-  , SLP-  Equipment Details: PT- , OT-owns shower seat Patient/family involved in discharge planning: PT- Patient,  OT-Patient, SLP-   MD ELOS: 7-10d Medical Rehab Prognosis:  Good Assessment:  58 year old male with history of HTN, T2DM, hyperlipidemia, PKU, mentally challenged who was admitted to [facility] on 03/02/2019 with fall 3 weeks ago with subsequent headache, dizziness, weakness, right brain with ambulation.  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  CTA head/neck showed acute infarct in  right   [place] and small R-VA ending in PICA question congenital variation v/s acute distal R-VA occlusion.  Echocardiogram showed ejection fraction of 60-65% with trivial MVR.  Carotid dopplers showed patent VA and no significant ICA stenosis . Dr. Doy Mince felt that stroke was due to small vessel disease and recommended ASA/Plavix X 3 weeks followed by ASA 81 mg/daily.  Therapy evaluation revealed decreased sensation LUE with dizziness, tangential conversation with decreased safety awareness. CIR recommended due to functional decline.  Please see preadmission assessment from earlier today as well.   Now requiring 24/7 Rehab RN,MD, as well as CIR level PT, OT and SLP.  Treatment team will focus on ADLs and mobility with goals set at [facility] I/Sup See Team Conference Notes for weekly updates to the plan of care

## 2019-03-09 NOTE — Plan of Care (Signed)
  Problem: Consults Goal: RH STROKE PATIENT EDUCATION Description: See Patient Education module for education specifics  Outcome: Progressing   Problem: RH BOWEL ELIMINATION Goal: RH STG MANAGE BOWEL WITH ASSISTANCE Description: STG Manage Bowel with min Assistance. Outcome: Progressing Goal: RH STG MANAGE BOWEL W/MEDICATION W/ASSISTANCE Description: STG Manage Bowel with Medication with  mod I Assistance. Outcome: Progressing   Problem: RH BLADDER ELIMINATION Goal: RH STG MANAGE BLADDER WITH ASSISTANCE Description: STG Manage Bladder With min Assistance Outcome: Progressing   Problem: RH SAFETY Goal: RH STG ADHERE TO SAFETY PRECAUTIONS W/ASSISTANCE/DEVICE Description: STG Adhere to Safety Precautions With cues/reminders Assistance/Device. Outcome: Progressing   Problem: RH PAIN MANAGEMENT Goal: RH STG PAIN MANAGED AT OR BELOW PT'S PAIN GOAL Description: Pain at or below level 5 Outcome: Progressing   Problem: RH KNOWLEDGE DEFICIT Goal: RH STG INCREASE KNOWLEDGE OF DIABETES Description: Patient will be able to manage DM using medications, dietary restrictions and monitor CBGs , treat results using handouts and educational information with cues/reminders Outcome: Progressing Goal: RH STG INCREASE KNOWLEDGE OF HYPERTENSION Description: Patient will be able to manage HTN using medications, dietary restrictions  using educational information with cues/reminders Outcome: Progressing Goal: RH STG INCREASE KNOWLEGDE OF HYPERLIPIDEMIA Description: Patient will be able to manage DM using medications, dietary restrictions using educational information with cues/reminders Outcome: Progressing Goal: RH STG INCREASE KNOWLEDGE OF STROKE PROPHYLAXIS Description: Patient will be able to manage secondary stroke prevention using medications, dietary restrictions using handouts and educational information with cues/reminders Outcome: Progressing   Problem: RH Vision Goal: RH LTG Vision  (Specify) Outcome: Progressing

## 2019-03-09 NOTE — Care Management Note (Signed)
[facility] Individual Statement of Services  Patient Name:  [name].  Date:  03/09/2019  Welcome to the [facility].  Our goal is to provide you with an individualized program based on your diagnosis and situation, designed to meet your specific needs.  With this comprehensive rehabilitation program, you will be expected to participate in at least 3 hours of rehabilitation therapies Monday-Friday, with modified therapy programming on the weekends.  Your rehabilitation program will include the following services:  Physical Therapy (PT), Occupational Therapy (OT), 24 hour per day rehabilitation nursing, Neuropsychology, Case Management (Social Worker), Rehabilitation Medicine, Nutrition Services and Pharmacy Services  Weekly team conferences will be held on Wednesday to discuss your progress.  Your Social Worker will talk with you frequently to get your input and to update you on team discussions.  Team conferences with you and your family in attendance may also be held.  Expected length of stay: 7-10 days  Overall anticipated outcome: supervision level  Depending on your progress and recovery, your program may change. Your Social Worker will coordinate services and will keep you informed of any changes. Your Social Worker's name and contact numbers are listed  below.  The following services may also be recommended but are not provided by the [facility] will be made to provide these services after discharge if needed.  Arrangements include referral to agencies that provide these services.  Your insurance has been verified to be:  UHC-Medicare Your primary doctor is:  Warden/ranger  Pertinent information will be shared with your doctor and your insurance company.  Social Worker:  Ovidio Kin, [place] or ([phone]  Information discussed with and copy given to patient by: Elease Hashimoto, 03/09/2019, 11:02 AM

## 2019-03-09 NOTE — Progress Notes (Signed)
Social Work Assessment and Plan   Patient Details  Name: [name]. MRN: [id number] Date of Birth: [birth year]  Today's Date: 03/09/2019  Problem List:  Patient Active Problem List   Diagnosis Date Noted  . Benign essential HTN   . Hypoalbuminemia due to protein-calorie malnutrition ([place])   . Hiccups   . Stroke (cerebrum) ([place]) 03/06/2019  . Brainstem infarct, acute ([place]) 03/06/2019  . Slow transit constipation   . Nausea and vomiting   . Diplopia   . Uncontrolled type 2 diabetes mellitus with hyperglycemia ([place])   . PKU (phenylketonuria) ([place])   . CVA (cerebral vascular accident) ([place]) 03/02/2019  . Obesity 09/03/2018  . Skin tags, multiple acquired 08/23/2016  . Subacromial bursitis 08/23/2016  . Frequent falls 05/15/2016  . Vitamin D deficiency 08/22/2015  . Knee pain 08/22/2015  . Carpal tunnel syndrome on left 05/12/2015  . External hemorrhoids 12/20/2014  . Ectatic abdominal aorta ([place]) 10/26/2014  . Allergic rhinitis 10/21/2014  . Diverticulosis 10/21/2014  . Type 2 diabetes mellitus with hyperlipidemia ([place]) 10/21/2014  . Hypertension 10/21/2014  . Hyperlipidemia associated with type 2 diabetes mellitus ([place]) 10/21/2014  . Pulmonary emphysema ([place]) 10/21/2014  . GERD (gastroesophageal reflux disease) 10/21/2014  . NAFLD (nonalcoholic fatty liver disease) 10/21/2014  . 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 Arthritis 10/21/2014  . Chest pain with high risk for cardiac etiology 10/21/2014  . Abdominal pain 10/07/2014  . Elevation of level of transaminase or lactic acid dehydrogenase (LDH) 10/07/2014  . Cardiac arrhythmia 10/07/2014  . Irritable bowel syndrome 10/07/2014  . Pancreatitis 10/07/2014  . Phenylketonuria (PKU) ([place]) 10/07/2014  . Renal cyst, left 10/07/2014  . Headache 10/07/2014  . Gall bladder polyp 10/01/2013   Past Medical History:  Past Medical History:  Diagnosis Date  . Arrhythmia   . Asthma   . Cyst of kidney, acquired   .  Diabetes mellitus without complication ([place]) 2878   type 2  . GERD (gastroesophageal reflux disease)   . History of chicken pox   . History of measles as a child   . History of PKU   . Hyperlipidemia   . Hypertension   . IBS (irritable bowel syndrome)   . Irregular heart beat   . Mentally challenged   . Pancreatitis    Past Surgical History:  Past Surgical History:  Procedure Laterality Date  . Cardiac Catherization     [facility]  . CARDIAC CATHETERIZATION     [facility]  . COLONOSCOPY    . ESOPHAGOGASTRODUODENOSCOPY (EGD) WITH PROPOFOL N/A 12/27/2016   Procedure: ESOPHAGOGASTRODUODENOSCOPY (EGD) WITH PROPOFOL;  Surgeon: Lin Landsman, MD;  Location: [facility];  Service: Gastroenterology;  Laterality: N/A;  . HEMORRHOID SURGERY    . Ligament Removal Left    of left thumb: dr. Cleda Mccreedy  . ligament removal  of left thumb     Dr. Cleda Mccreedy  . NM GATED MYOCARDIAL STUDY (ARMX HX)  06/23/2014   [place]. Normal   Social History:  reports that he has never smoked. He has never used smokeless tobacco. He reports that he does not drink alcohol or use drugs.  Family / Support Systems Marital Status: Married Patient Roles: Spouse, Other (Comment)(son) Spouse/Significant Other: [name] [phone] Other Supports: Patricia-mom [phone] Anticipated Caregiver: Wife and his brother along with sister in-law Ability/Limitations of Caregiver: Supervision wife is disabled also but does all of the home management Caregiver Availability: 24/7 Family Dynamics: Close with wife, brother and sister in-law. Both wife and pt seem limited and very  simple. They manage by depending upon one another. Pt did drive prior to admission and is aware he will not be when he leaves here  Social History Preferred language: English Religion: Baptist Cultural Background: No issues Education: HS Read: Yes(simple) Write: Yes(simple) Employment Status: Disabled Public relations account executive Issues: Pt is  currently on parole and did wear an ankle braclet but was cut off then needed MRI, will need replaced once discharged from the hospital Guardian/Conservator: None-according to MD pt is questionable able to make his own decisions while here. Will look toward his wife, since she is next of kin and have both together make any decisions while here   Abuse/Neglect Abuse/Neglect Assessment Can Be Completed: Yes Physical Abuse: Denies Verbal Abuse: Denies Sexual Abuse: Denies Exploitation of patient/patient's resources: Denies Self-Neglect: Denies  Emotional Status Pt's affect, behavior and adjustment status: Pt is motivated to do well and hopes his double vision gets better, this is his main issue. He feels if this was better he could do what he needed to do to be independent again. He is walking with a walker and feels it works good for him Recent Psychosocial Issues: other health issues-diabetes and HTN, wife reports she tries to cook healthy but pt sneeks salt Psychiatric History: No issues-question if pt mentally challeged due to very simplistic and has limited understanding of his medical issues. May benefit if here long enough to see neuro-psych while here. He reports he talks with a Education officer, museum once every other week from home. Must be through the corectional syster. Substance Abuse History: No issues  Patient / Family Perceptions, Expectations & Goals Pt/Family understanding of illness & functional limitations: Pt and wife can explain his stroke and his vision issues. She has spoken with the MD and hopes he comes home soon. Pt seems to understand the reason he is here but wants to go home soon. Premorbid pt/family roles/activities: husband, brother, disabled, neighbor, etc Anticipated changes in roles/activities/participation: resume Pt/family expectations/goals: Pt states: " I want to se better before I go home."  Wife states: " I hope he can move better I need him to do that cause I'm  disabled."  [employer]: Other (Comment)(Social Worker thru the [employer] for bi-weekly follow up) Premorbid Home Care/DME Agencies: None Transportation available at discharge: Family members Resource referrals recommended: Neuropsychology  Discharge Planning Living Arrangements: Spouse/significant other Support Systems: Spouse/significant other, Other relatives, Parent Type of Residence: Private residence Insurance Resources: Multimedia programmer (specify)(UHC-Medicare) Financial Resources: SSI, Family Support Financial Screen Referred: Previously completed Living Expenses: Own Money Management: Spouse Does the patient have any problems obtaining your medications?: Yes (Describe)(Co-pay issues) Home Management: Wife Patient/Family Preliminary Plans: Return home with wife who is able to assist some but is disabled herself. They have extended family who will be checking on them. Aware will need to call probation officer when discharged so can replace ankle braclet. Social Work Anticipated Follow Up Needs: HH/OP  Clinical Impression Simplistic pt who wants to go home soon but also wants his double vision fixed before he does. His wife is disabled but can assist some. Pt is fairly high level and will only be here a short time. Work on discharge needs, may benefit from seeing neuro-psych while here. Will contact probation officer when have a discharge date to inform of discharge.  Elease Hashimoto 03/09/2019, 10:57 AM

## 2019-03-09 NOTE — Progress Notes (Signed)
Patient information reviewed and entered into eRehab System by Ileana Ladd, [place] coordinator. Information including medical coding, function ability, and quality indicators will be reviewed and updated through discharge.

## 2019-03-09 NOTE — Progress Notes (Signed)
Physical Therapy Session Note  Patient Details  Name: [name]. MRN: [id number] Date of Birth: Aug 10, 1960  Today's Date: 03/09/2019 PT Individual Time: 6295-2841 and 1401- 1443 PT Individual Time Calculation (min): 71 min and 42 min    Short Term Goals: Week 1:  PT Short Term Goal 1 (Week 1): = to LTGs based on ELOS  Skilled Therapeutic Interventions/Progress Updates:    Session 1: Pt supine in bed upon PT arrival, agreeable to therapy tx and denies pain. Pt transferred to sitting EOB with supervision and ambulated throughout the unit this session with RW and CGA x 150 ft, x 150 ft and x 200 ft, cues for safety and step length. Pt used nustep this session x 10 minutes on workload 6 for global strengthening and endurance. Pt ambulated to the mat and worked on R UE coordination in order to make a Naval architect. Pt worked on dynamic standing balance this session without UE support to perform the following tasks: ambulation without AD 2 x 50 ft, ambulation while naming colors, backwards gait, sidestepping, standing on airex to toss horseshoes, picking up objects off the ground, ambulating while weaving through cones x 2 trials, standing to complete peg board puzzle x 2 trials. Pt reports wanting to weight himself, ambulated to the dayroom with RW and CGA and stepped on/off scale to take his weight, CGA. Pt ambulated back to room and left in w/c with needs in reach and chair alarm set.   Session 2: Pt supine in bed upon PT arrival, agreeable to therapy tx and denies pain. Pt transferred to sitting EOB with supervision and ambulated to the gym with CGA and RW x 200 ft. Pt worked on continued dynamic standing balance this session to ambulate forwards/backwards, ambulate forwards/backwards while tossing ball, standing on airex throwing ball against rebounder trampoline, CGA-min assist. Pt performed floor transfer this session from mat<>floor with CGA and cues for techniques. Therapist  provided education on what to do in case of a fall and how to prevent falls. Pt reports he usually gets on the floor because his bed is on the floor, but his dad is going to set up a bed/mattress for him before he discharges. Pt ambulated to the dayroom and used kinetron this session working on standing balance, x 1 trial with UE support while standing and x 2 trials standing without UE support - min/mod assist while marching. Pt ambulated back to room and left in bed with needs in reach and bed alarm set.   Therapy Documentation Precautions:  Precautions Precautions: Fall Restrictions Weight Bearing Restrictions: No    Therapy/Group: Individual Therapy  Netta Corrigan, PT, DPT, CSRS 03/09/2019, 8:00 AM

## 2019-03-09 NOTE — Progress Notes (Signed)
[place] PHYSICAL MEDICINE & REHABILITATION PROGRESS NOTE  Subjective/Complaints:  No issues overnight.  Patient has been tolerating therapy program.  He still has blurriness of vision but no double vision.  He does use an eye patch.  He notes some numbness on the right side of his face as well as the right side of his body.  ROS: + Dizziness.  Denies CP, shortness of breath, nausea, vomiting diarrhea.  Objective: Vital Signs: Blood pressure (!) 134/95, pulse (!) 56, temperature 98.3 F (36.8 C), temperature source Oral, resp. rate 18, SpO2 95 %. No results found. Recent Labs    03/07/19 0552 03/09/19 0740  WBC 7.9 6.9  HGB 15.8 16.2  HCT 46.9 48.3  PLT 177 189   Recent Labs    03/07/19 0552 03/09/19 0740  NA 140 138  K 4.2 4.3  CL 105 104  CO2 26 23  GLUCOSE 199* 157*  BUN 11 21*  CREATININE 1.05 1.19  CALCIUM 9.0 9.3    Physical Exam: BP (!) 134/95 (BP Location: Right Arm)   Pulse (!) 56   Temp 98.3 F (36.8 C) (Oral)   Resp 18   SpO2 95%  Constitutional: No distress . Vital signs reviewed.  Obese. HENT: Normocephalic.  Atraumatic. Eyes: EOMI. No discharge. Cardiovascular: No JVD. Respiratory: Normal effort.  No stridor. GI: Non-distended. Skin: Warm and dry.  Intact. Psych: Normal mood.  Normal behavior. Musc: No edema in extremities.  No tenderness in extremities. Neurological: Alert Motor: Motor: 5/5 bilateral deltoid bicep tricep grip hip flexor knee extensor ankle dorsiflexion Mild dysmetria right finger-nose-finger  Assessment/Plan: 1. 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  Physiatrist is providing close team supervision and 24 hour management of active medical problems listed below.  Physiatrist and rehab team continue to assess barriers to discharge/monitor patient progress toward functional and medical goals  Care  Tool:  Bathing    Body parts bathed by patient: Right arm, Left arm, Chest, Abdomen, Front perineal area, Right upper leg, Left upper leg, Face, Buttocks, Right lower leg, Left lower leg   Body parts bathed by helper: Buttocks, Right lower leg, Left lower leg     Bathing assist Assist Level: Contact Guard/Touching assist     Upper Body Dressing/Undressing Upper body dressing   What is the patient wearing?: Pull over shirt    Upper body assist Assist Level: Supervision/Verbal cueing    Lower Body Dressing/Undressing Lower body dressing      What is the patient wearing?: Pants, Incontinence brief     Lower body assist Assist for lower body dressing: Minimal Assistance - Patient > 75%     Toileting Toileting    Toileting assist Assist for toileting: Minimal Assistance - Patient > 75%     Transfers Chair/bed transfer  Transfers assist     Chair/bed transfer assist level: Contact Guard/Touching assist     Locomotion Ambulation   Ambulation assist      Assist level: Contact Guard/Touching assist Assistive device: Walker-rolling Max distance: 200 ft   Walk 10 feet activity   Assist     Assist level: Contact Guard/Touching assist Assistive device: Walker-rolling   Walk 50 feet activity   Assist    Assist level: Contact Guard/Touching assist Assistive device: Walker-rolling    Walk 150 feet activity   Assist Walk 150 feet activity did not occur: Safety/medical concerns  Assist level: Contact Guard/Touching assist Assistive  device: Walker-rolling    Walk 10 feet on uneven surface  activity   Assist     Assist level: Minimal Assistance - Patient > 75%     Wheelchair     Assist Will patient use wheelchair at discharge?: No             Wheelchair 50 feet with 2 turns activity    Assist            Wheelchair 150 feet activity     Assist            Medical Problem List and Plan: 1.  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  Continue CIR PT,  OT, anticipate short length of stay as discussed with patient and his father we will set the discharge date on 12/23 2.  Antithrombotics: -DVT/anticoagulation:  Pharmaceutical: Lovenox             -antiplatelet therapy: ASA/Plavix.  3. Pain Management: Tylenol prn.  4. Mood: LCSW to follow for evaluation and support.              -antipsychotic agents: N/A 5. Neuropsych: This patient is?  Fully capable of making decisions on his own behalf. 6. Skin/Wound Care: Routine pressure relief measures.  7. Fluids/Electrolytes/Nutrition: Monitor I/O.  CMP ordered. 8. T2DM with hyperglycemia: Hgb A1c- 8.6--poorly controlled. Will monitor BS ac/hs will continue SSI for now.  Invokana PTA, resumed.               Monitor with increased mobility 9. HTN: Monitor BP tid--continue to hold lisinopril             Monitor with increased mobility 10. COPD: On singulair and pulmicort.  11. Dyslipidemia: Continue Lipitor and fenofibrate. 12. Phenylketonuria: Dietary modifications ordered.  Intellectual disability 13. Nausea/vomiting: Vestibular evaluation.    Eyepatch patch for diplopia if necessary.     Continue phenergan prn.   Improving 14.  Hiccups:  Improved 15. Constipation: Resolved with lactulose.  Senna at bedtime.   Improving 16.  Hypoalbuminemia  Supplement initiated on 12/19  LOS: 3 days A FACE TO FACE EVALUATION WAS PERFORMED  Charlett Blake 03/09/2019, 10:30 AM

## 2019-03-09 NOTE — Progress Notes (Signed)
Occupational Therapy Session Note  Patient Details  Name: [name]. MRN: [id number] Date of Birth: June 29, 1960  Today's Date: 03/09/2019 OT Individual Time: 1191-4782 OT Individual Time Calculation (min): 57 min    Short Term Goals: Week 1:  OT Short Term Goal 1 (Week 1): NA STG = LTG  Skilled Therapeutic Interventions/Progress Updates:    Pt in bed to start session with supervision transfer to the EOB.  Noted pt at times closing his right eye.  He reported no diplopia today with any scanning of items in his room or therapist's fingers, both near and far, but he does report increased blurriness in the right eye as well as increased light sensitivity in general. He was able to ambulate to the bathroom for showering with use of the walker and min guard assist.  Min instructional cueing to slow down as he hurriedly ambulated to the shower with decreased awareness.  With bathing he needed mod instructional cueing with overall min guard assist.  He needed mod instructional cueing for thoroughness as he initially only washed his right upper leg and right side.  He did not attempt washing the left side until cued to do so.  He then needed cueing for washing his peri area and buttocks as well as for washing his head.  Once dried off, he transferred out to the EOB for dressing with min assist for balance as he ambulated without an assistive device.  LOB posteriorly on one occasion.  UB dressing was completed at supervision, LB dressing was completed at min guard assist.  Once dressing was finished, pt was asked to complete grooming tasks of brushing his teeth in standing with min guard as well as retrieving his dirty towels in the bathroom and placing them into the dirty laundry.  Next, had pt sit on the EOB for further evaluation of his dizziness.  He was able to consistently track in all areas but demonstrated gaze holding nystagmus to the right when holding to the right and to the left  horizontally with holding to the left.  With midline gaze, he demonstrated horizontal left beating nystagmus.  VOR cancellation exhibited slight nystagmus as well.  He demonstrated good saccadic movement, but did still demonstrate some nystagmus.  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  Finished session with pt sitting in the wheelchair and with call button and phone in reach and safety alarm belt in place.   Therapy Documentation Precautions:  Precautions Precautions: Fall Restrictions Weight Bearing Restrictions: No  Pain: Pain Assessment Pain Scale: Faces Pain Type: Acute pain Pain Location: Abdomen Pain Orientation: Right Pain Descriptors / Indicators: Discomfort Pain Onset: On-going Pain Intervention(s): Repositioned ADL: See Care Tool Section for some details of mobility and selfcare  Therapy/Group: Individual Therapy  Harlea Goetzinger OTR/L 03/09/2019, 12:31 PM

## 2019-03-10 ENCOUNTER — Inpatient Hospital Stay (HOSPITAL_COMMUNITY): Payer: Medicare Other | Admitting: Occupational Therapy

## 2019-03-10 ENCOUNTER — Inpatient Hospital Stay (HOSPITAL_COMMUNITY): Payer: Medicare Other

## 2019-03-10 LAB — GLUCOSE, CAPILLARY
Glucose-Capillary: 159 mg/dL — ABNORMAL HIGH (ref 70–99)
Glucose-Capillary: 150 mg/dL — ABNORMAL HIGH (ref 70–99)
Glucose-Capillary: 134 mg/dL — ABNORMAL HIGH (ref 70–99)
Glucose-Capillary: 168 mg/dL — ABNORMAL HIGH (ref 70–99)

## 2019-03-10 NOTE — Progress Notes (Signed)
Occupational Therapy Session Note  Patient Details  Name: [name]. MRN: [id number] Date of Birth: September 21, 1960  Today's Date: 03/10/2019 OT Individual Time: 5188-4166 OT Individual Time Calculation (min): 69 min    Short Term Goals: Week 1:  OT Short Term Goal 1 (Week 1): NA STG = LTG  Skilled Therapeutic Interventions/Progress Updates:    Treatment session with focus on ADL retraining with dynamic standing balance, functional mobility, and functional use of nondominant RUE.  Pt received upright in bed finishing breakfast.  Ambulated to toilet with RW with CGA, one LOB requiring min assist to correct followed by min assist for remainder of ambulation to toilet.  Pt urinated in standing with supervision and alternating UE support on RW.  Cues provided throughout for proper positioning and use of RW during mobility as pt frequently leaving it behind or to the side.  Engaged in bathing at sit > stand level in room shower, pt with improved sequencing and attention to washing Rt side of body.  Completed dressing from EOB with setup for items and CGA when standing to pull pants over hips. Ambulated to sink with RW with close supervision, pt stood at sink with supervision while engaging in oral care.  Pt verbose throughout and perseverating on decreased sensation in RUE.  Reassured pt that it takes time for recovery and encouraged pt to continue to incorporate RUE into functional tasks as he has good movement.  Returned to bed and remained supine with all needs in reach.  Therapy Documentation Precautions:  Precautions Precautions: Fall Restrictions Weight Bearing Restrictions: No Pain:  Pt with no c/o pain   Therapy/Group: Individual Therapy  Simonne Come 03/10/2019, 9:37 AM

## 2019-03-10 NOTE — Progress Notes (Signed)
Occupational Therapy Session Note  Patient Details  Name: [name]. MRN: [id number] Date of Birth: January 20, 1961  Today's Date: 03/10/2019 OT Individual Time: 3299-2426 OT Individual Time Calculation (min): 47 min    Short Term Goals: Week 1:  OT Short Term Goal 1 (Week 1): NA STG = LTG  Skilled Therapeutic Interventions/Progress Updates:    Pt completed supine to sit with supervision to start session.  He then ambulated to the bathroom with use of the rollator and supervision as well.  He completed toilet transfer, toilet clothing management, and toilet hygiene with supervision sit to stand.  After washing his hands, he ambulated down to the therapy ADL apartment where he completed transfers to the recliner, sofa, and tub bench.  He reports already having a tub bench at home because of his spouse as well as 3:1.  While sitting on the couch, had him work on Financial planner with use of the [employer].  He was able to converge at approximately 2', 3', and 4', but noted vertical nystagmus when attempting.  He still demonstrates some blurry vision in the right eye per his report as well as light sensitivity in both eyes.  Finished session with pt ambulating back to the room and pt transferring to the bed.  Call button and phone in reach with bed alarm in place.    Therapy Documentation Precautions:  Precautions Precautions: Fall Restrictions Weight Bearing Restrictions: No  Pain: Pain Assessment Pain Scale: Faces Pain Score: 0-No pain ADL: See Care Tool Section for some details of mobility and selfcare  Therapy/Group: Individual Therapy  Nasri Boakye OTR/L 03/10/2019, 4:23 PM

## 2019-03-10 NOTE — Progress Notes (Signed)
Physical Therapy Session Note  Patient Details  Name: [name]. MRN: [id number] Date of Birth: 06-Aug-1960  Today's Date: 03/10/2019 PT Individual Time: 1030-1055 PT Individual Time Calculation (min): 25 min   Short Term Goals: Week 1:  PT Short Term Goal 1 (Week 1): = to LTGs based on ELOS  Skilled Therapeutic Interventions/Progress Updates:   Pt reporting overall nausea but agreeable to try to get up OOB this session. Pt performs bed mobility on flat surface without assistance and extra time for both supine <> sit and rolling. Pt request to use RW during session due to nausea and perseverating on nausea throughout session. Pt performs functional transfers and gait on unit and in room at supervision level with RW > 150'. At one point, going back to the room, pt self selecting gait speed from fast to slow and even attempting "jogging" at one point. Declines working on balance or stairs due to nausea so engaged in standing therex for functional strengthening including standing hamstring curls, standing hip flexion, standing hip abduction, and standing heel/toe raises x 10 reps.   Therapy Documentation Precautions:  Precautions Precautions: Fall Restrictions Weight Bearing Restrictions: No  Pain:  c/o nausea and intermittent discomfort in RLE. Pt drinking ginger ale and reports having medication.   Therapy/Group: Individual Therapy  Canary Brim Ivory Broad, PT, DPT, CBIS  03/10/2019, 10:57 AM

## 2019-03-10 NOTE — Progress Notes (Signed)
[facility] PHYSICAL MEDICINE & REHABILITATION PROGRESS NOTE  Subjective/Complaints:  Some numbness R>L hand, IV left wrist bothers him, no IV meds , drinking ok   ROS: + Dizziness.  Denies CP, shortness of breath, nausea, vomiting diarrhea.  Objective: Vital Signs: Blood pressure 124/81, pulse (!) 52, temperature 98.1 F (36.7 C), temperature source Oral, resp. rate 18, SpO2 98 %. No results found. Recent Labs    03/09/19 0740  WBC 6.9  HGB 16.2  HCT 48.3  PLT 189   Recent Labs    03/09/19 0740  NA 138  K 4.3  CL 104  CO2 23  GLUCOSE 157*  BUN 21*  CREATININE 1.19  CALCIUM 9.3    Physical Exam: BP 124/81 (BP Location: Left Arm)   Pulse (!) 52   Temp 98.1 F (36.7 C) (Oral)   Resp 18   SpO2 98%  Constitutional: No distress . Vital signs reviewed.  Obese. HENT: Normocephalic.  Atraumatic. Eyes: EOMI. No discharge. Cardiovascular: No JVD. Respiratory: Normal effort.  No stridor. GI: Non-distended. Skin: Warm and dry.  Intact. Psych: Normal mood.  Normal behavior. Musc: No edema in extremities.  No tenderness in extremities. Neurological: Alert Motor: Motor: 5/5 bilateral deltoid bicep tricep grip hip flexor knee extensor ankle dorsiflexion Mild dysmetria right finger-nose-finger  Assessment/Plan: 1. 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  Physiatrist is providing close team supervision and 24 hour management of active medical problems listed below.  Physiatrist and rehab team continue to assess barriers to discharge/monitor patient progress toward functional and medical goals  Care Tool:  Bathing    Body parts bathed by patient: Right arm, Left arm, Chest, Abdomen, Front perineal area, Right upper leg, Left upper leg, Face, Buttocks, Right lower leg, Left lower leg   Body parts bathed by helper: Buttocks, Right lower leg, Left lower  leg     Bathing assist Assist Level: Contact Guard/Touching assist     Upper Body Dressing/Undressing Upper body dressing   What is the patient wearing?: Pull over shirt    Upper body assist Assist Level: Supervision/Verbal cueing    Lower Body Dressing/Undressing Lower body dressing      What is the patient wearing?: Pants, Incontinence brief     Lower body assist Assist for lower body dressing: Contact Guard/Touching assist     Toileting Toileting    Toileting assist Assist for toileting: Minimal Assistance - Patient > 75%     Transfers Chair/bed transfer  Transfers assist     Chair/bed transfer assist level: Minimal Assistance - Patient > 75%     Locomotion Ambulation   Ambulation assist      Assist level: Minimal Assistance - Patient > 75% Assistive device: No Device Max distance: 10'   Walk 10 feet activity   Assist     Assist level: Contact Guard/Touching assist Assistive device: Walker-rolling   Walk 50 feet activity   Assist    Assist level: Contact Guard/Touching assist Assistive device: Walker-rolling    Walk 150 feet activity   Assist Walk 150 feet activity did not occur: Safety/medical concerns  Assist level: Contact Guard/Touching assist Assistive device: Walker-rolling    Walk 10 feet on uneven surface  activity   Assist     Assist level: Minimal Assistance - Patient > 75%     Wheelchair     Assist Will patient use wheelchair at discharge?: No  Wheelchair 50 feet with 2 turns activity    Assist            Wheelchair 150 feet activity     Assist            Medical Problem List and Plan: 1.  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  Continue CIR PT,  OT,team conf in am 2.  Antithrombotics: -DVT/anticoagulation:  Pharmaceutical: Lovenox             -antiplatelet  therapy: ASA/Plavix.  3. Pain Management: Tylenol prn.  4. Mood: LCSW to follow for evaluation and support.              -antipsychotic agents: N/A 5. Neuropsych: This patient is?  Fully capable of making decisions on his own behalf. 6. Skin/Wound Care: Routine pressure relief measures.  7. Fluids/Electrolytes/Nutrition: Monitor I/O.  CMP ordered. 8. T2DM with hyperglycemia: Hgb A1c- 8.6--poorly controlled. Will monitor BS ac/hs will continue SSI for now.  Invokana PTA, resumed. Current dose is 197m per day may need to increase to home dose of 3061mper day if non fasting CBG still above 180           CBG (last 3)  Recent Labs    03/09/19 1650 03/09/19 2117 03/10/19 0616  GLUCAP 182* 176* 159*  fair in hospital control  9. HTN: Monitor BP tid--continue to hold lisinopril              Vitals:   03/09/19 2109 03/10/19 0515  BP:  124/81  Pulse:  (!) 52  Resp:  18  Temp:  98.1 F (36.7 C)  SpO2: 95% 98%  controlled  10. COPD: On singulair and pulmicort.  11. Dyslipidemia: Continue Lipitor and fenofibrate. 12. Phenylketonuria: Dietary modifications ordered.  Intellectual disability 13. Nausea/vomiting: Vestibular evaluation.    Eyepatch patch for diplopia if necessary.     Continue phenergan prn.   Improving 14.  Hiccups:  Improved 15. Constipation: Resolved with lactulose.  Senna at bedtime.   Improving 16.  Hypoalbuminemia  Supplement initiated on 12/19  LOS: 4 days A FACE TO FACE EVALUATION WAS PERFORMED  AnCharlett Blake2/22/2020, 8:42 AM

## 2019-03-10 NOTE — Progress Notes (Signed)
Physical Therapy Session Note  Patient Details  Name: [name]. MRN: [id number] Date of Birth: Aug 12, 1960  Today's Date: 03/10/2019 PT Individual Time: 1305-1400 PT Individual Time Calculation (min): 55 min   Short Term Goals: Week 1:  PT Short Term Goal 1 (Week 1): = to LTGs based on ELOS  Skilled Therapeutic Interventions/Progress Updates:    Pt supine in bed upon PT arrival, agreeable to therapy tx and denies pain. Pt ambulated to the gym with RW and supervision x 150 ft. Pt reports having to use the bathroom, ambulated gym<>bathroom with RW and supervision, cues to use RW at all times even in the bathroom. Supervision for standing balance with clothing management, pt continent of bowel and bladder. Pt asking about using a rollator for home. Trial of rollator for ambulation today, pt ambulated 2 x 100 ft with rollator and CGA, cues for device management. Pt ambulated within rehab apartment using rollator to simulate home set up, performed recliner and couch transfer all with supervision-CGA and cues for safety. Pt ambulated to dayroom and used nustep today x 5 minutes on workload 6 working on global strength and endurance. Pt ambulated back to gym. Pt worked on dynamic standing balance this session without UE support to perform the following tasks: toe taps on aerobic step, step ups on aerobic step, forwards/backwards walking while naming colors/foods, ambulating to pick up items off of the floor. Pt ambulated back to room and left in bed with needs in reach and chair alarm set.   Therapy Documentation Precautions:  Precautions Precautions: Fall Restrictions Weight Bearing Restrictions: No    Therapy/Group: Individual Therapy   Netta Corrigan, PT, DPT, CSRS 03/10/19  1:31 PM   [place] 03/10/2019, 1:31 PM

## 2019-03-11 ENCOUNTER — Inpatient Hospital Stay (HOSPITAL_COMMUNITY): Payer: Medicare Other

## 2019-03-11 ENCOUNTER — Inpatient Hospital Stay (HOSPITAL_COMMUNITY): Payer: Medicare Other | Admitting: Occupational Therapy

## 2019-03-11 ENCOUNTER — Inpatient Hospital Stay (HOSPITAL_COMMUNITY): Payer: Medicare Other | Admitting: Physical Therapy

## 2019-03-11 ENCOUNTER — Ambulatory Visit: Payer: Medicare Other

## 2019-03-11 LAB — GLUCOSE, CAPILLARY
Glucose-Capillary: 152 mg/dL — ABNORMAL HIGH (ref 70–99)
Glucose-Capillary: 191 mg/dL — ABNORMAL HIGH (ref 70–99)
Glucose-Capillary: 137 mg/dL — ABNORMAL HIGH (ref 70–99)
Glucose-Capillary: 152 mg/dL — ABNORMAL HIGH (ref 70–99)

## 2019-03-11 MED ORDER — GABAPENTIN 300 MG PO CAPS: 300.0000 mg | ORAL_CAPSULE | Freq: Two times a day (BID) | ORAL | 1 refills | Status: DC

## 2019-03-11 MED ORDER — FENOFIBRATE 54 MG PO TABS: 54.0000 mg | ORAL_TABLET | Freq: Every day | ORAL | 0 refills | Status: DC

## 2019-03-11 MED ORDER — MONTELUKAST SODIUM 10 MG PO TABS: ORAL_TABLET | ORAL | 11 refills | Status: DC

## 2019-03-11 MED ORDER — MECLIZINE HCL 12.5 MG PO TABS: 12.5000 mg | ORAL_TABLET | Freq: Three times a day (TID) | ORAL | 0 refills | Status: DC

## 2019-03-11 MED ORDER — OMEPRAZOLE 20 MG PO CPDR: 20.0000 mg | DELAYED_RELEASE_CAPSULE | Freq: Every day | ORAL | 12 refills | Status: DC

## 2019-03-11 MED ORDER — CLOPIDOGREL BISULFATE 75 MG PO TABS: 75.0000 mg | ORAL_TABLET | Freq: Every day | ORAL | 0 refills | Status: DC

## 2019-03-11 MED ORDER — ALBUTEROL SULFATE HFA 108 (90 BASE) MCG/ACT IN AERS: INHALATION_SPRAY | RESPIRATORY_TRACT | 3 refills | Status: DC

## 2019-03-11 MED ORDER — CLOBETASOL PROPIONATE 0.05 % EX CREA: 1.0000 "application " | TOPICAL_CREAM | Freq: Two times a day (BID) | CUTANEOUS | 0 refills | Status: DC

## 2019-03-11 MED ORDER — CANAGLIFLOZIN 100 MG PO TABS: 100.0000 mg | ORAL_TABLET | Freq: Every day | ORAL | 1 refills | Status: DC

## 2019-03-11 MED ORDER — FLUTICASONE PROPIONATE HFA 110 MCG/ACT IN AERO: 1.0000 | INHALATION_SPRAY | Freq: Two times a day (BID) | RESPIRATORY_TRACT | 12 refills | Status: DC

## 2019-03-11 MED ORDER — ATORVASTATIN CALCIUM 40 MG PO TABS: 40.0000 mg | ORAL_TABLET | Freq: Every day | ORAL | 0 refills | Status: DC

## 2019-03-11 MED ORDER — ACETAMINOPHEN 325 MG PO TABS: 325.0000 mg | ORAL_TABLET | ORAL | Status: DC | PRN

## 2019-03-11 NOTE — Progress Notes (Signed)
Physical Therapy Discharge Summary  Patient Details  Name: [name]. MRN: [id number] Date of Birth: 1960/08/29  Today's Date: 03/12/2019    Patient has met 9 of 9 long term goals due to improved activity tolerance, improved balance, increased strength, improved attention and improved awareness.  Patient to discharge at an ambulatory level Modified Independent.     Reasons goals not met: n/a  Recommendation:  Patient will benefit from ongoing skilled PT services in home health setting to continue to advance safe functional mobility, address ongoing impairments in balance, endurance, strength, and minimize fall risk.  Equipment: Rollator  Reasons for discharge: treatment goals met  Patient/family agrees with progress made and goals achieved: Yes  PT Discharge Precautions/Restrictions Precautions Precautions: Fall Restrictions Weight Bearing Restrictions: No Vital Signs Oxygen Therapy SpO2: 97 % O2 Device: Room Air Pain Pain Assessment Pain Scale: 0-10 Pain Score: 5  Pain Type: Acute pain Pain Location: Leg Pain Orientation: Right Pain Descriptors / Indicators: Discomfort Pain Onset: With Activity Pain Intervention(s): Repositioned Cognition Overall Cognitive Status: History of cognitive impairments - at baseline Arousal/Alertness: Awake/alert Orientation Level: Oriented X4 Attention: Focused;Sustained Focused Attention: Appears intact Awareness: Impaired Awareness Impairment: Intellectual impairment Problem Solving: Impaired Problem Solving Impairment: Functional complex Sensation Sensation Light Touch: Impaired Detail Peripheral sensation comments: R LE decreased light touch compared to L Light Touch Impaired Details: Impaired RLE Proprioception: Appears Intact Coordination Gross Motor Movements are Fluid and Coordinated: No Fine Motor Movements are Fluid and Coordinated: Yes Coordination and Movement Description: gross motor movements  impaired due to ataxia and increased postural sway with functional mobility Motor  Motor Motor: Ataxia Motor - Discharge Observations: impaired balance, ataxia (improved)  Mobility Bed Mobility Bed Mobility: Supine to Sit;Sit to Supine Supine to Sit: Independent Sit to Supine: Independent Transfers Transfers: Stand Pivot Transfers;Stand to Sit;Sit to Stand Sit to Stand: Independent with assistive device Stand to Sit: Independent with assistive device Stand Pivot Transfers: Independent with assistive device Transfer (Assistive device): Rollator Locomotion  Gait Ambulation: Yes Gait Assistance: Independent with assistive device Gait Distance (Feet): 150 Feet Assistive device: Rollator Gait Gait: Yes Gait Pattern: Decreased step length - left;Decreased step length - right;Ataxic Gait velocity: decreased Stairs / Additional Locomotion Stairs: Yes Stairs Assistance: Supervision/Verbal cueing Stair Management Technique: Two rails Number of Stairs: 12 Height of Stairs: 6 Wheelchair Mobility Wheelchair Mobility: No  Trunk/Postural Assessment  Cervical Assessment Cervical Assessment: Within Functional Limits Thoracic Assessment Thoracic Assessment: Within Functional Limits Lumbar Assessment Lumbar Assessment: Within Functional Limits Postural Control Postural Control: Deficits on evaluation Righting Reactions: delayed and inadequate Protective Responses: delayed and inadequate  Balance Balance Balance Assessed: Yes Standardized Balance Assessment Standardized Balance Assessment: Berg Balance Test Berg Balance Test Sit to Stand: Able to stand without using hands and stabilize independently Standing Unsupported: Able to stand safely 2 minutes Sitting with Back Unsupported but Feet Supported on Floor or Stool: Able to sit safely and securely 2 minutes Stand to Sit: Sits safely with minimal use of hands Transfers: Able to transfer safely, minor use of hands Standing  Unsupported with Eyes Closed: Able to stand 10 seconds with supervision Standing Ubsupported with Feet Together: Needs help to attain position but able to stand for 30 seconds with feet together From Standing, Reach Forward with Outstretched Arm: Can reach confidently >25 cm (10") From Standing Position, Pick up Object from Floor: Able to pick up shoe, needs supervision From Standing Position, Turn to Look Behind Over each Shoulder: Turn sideways only but maintains balance  Turn 360 Degrees: Able to turn 360 degrees safely in 4 seconds or less Standing Unsupported, Alternately Place Feet on Step/Stool: Able to complete >2 steps/needs minimal assist Standing Unsupported, One Foot in Front: Loses balance while stepping or standing Standing on One Leg: Tries to lift leg/unable to hold 3 seconds but remains standing independently Total Score: 39 Static Sitting Balance Static Sitting - Level of Assistance: 7: Independent Dynamic Sitting Balance Dynamic Sitting - Level of Assistance: 7: Independent Static Standing Balance Static Standing - Level of Assistance: 6: Modified independent (Device/Increase time) Dynamic Standing Balance Dynamic Standing - Level of Assistance: 5: Stand by assistance Extremity Assessment  RLE Assessment RLE Assessment: Exceptions to [facility] Active Range of Motion (AROM) Comments: WFL General Strength Comments: grossly 4/5 throughout with 3+/5 hip flexion LLE Assessment LLE Assessment: Exceptions to [facility] General Strength Comments: Grossly 4+/5 assessed in supine    [place] 03/11/2019, 12:10 PM   Amy Clemetine Marker 03/12/2019, 10:00 AM  Lavone Nian, PT, DPT 03/12/2019, 3:41 PM

## 2019-03-11 NOTE — Progress Notes (Signed)
Physical Therapy Session Note  Patient Details  Name: [name]. MRN: [id number] Date of Birth: Feb 17, 1961  Today's Date: 03/11/2019 PT Individual Time: 1300-1350 PT Individual Time Calculation (min): 50 min   Short Term Goals: Week 1:  PT Short Term Goal 1 (Week 1): = to LTGs based on ELOS     Skilled Therapeutic Interventions/Progress Updates:     Pt in bed; he denied pain.  In flat bed, no rails, supine> sit indepednently.  Gait training x 200' iwht rollator on level tile >< room with supervision.  Pt remembered brakes of rollator without cues except on initial sit> stand from bed.  Pt with mild LOB backwards upon sit> stand, recovered due to LEs pressing against bed..  neuromuscular re-education via forced use, multimodal cues for balance challenge and sustained stretch bil heel cores, standing with forefeet on wedge x 5 min, during dual task of pegboard puzzle via picture, with extra time and 1 cue for accuracy.  Pt drifted backwards in standing, without any spontaneous hip strategy.  With multimodal cues, pt began using hip strategy effectively.  Reciprocal scooting forward/backward for pelvic dissociation.  In supine, bil bridging to assist with sequential lateral scooting in supine; bil hip adductor squeezes during bridging , focusing on eccentric control. In R/L side lying: 15 x 1 L/R hip abduction via clam shell movements with bil hips and knees flexed.  Advanced gait training iwht bil UE support on PT's upper arms, during L><R side stepping through agility ladder on floor.  Pt had numerous LOB when stepping R>< narrow BOS within ladder spaces; mod assist overall.  Sit> supine independent.  At end of session, pt in bed with needs at hand and bed alarm set.    Therapy Documentation Precautions:  Precautions Precautions: Fall Restrictions Weight Bearing Restrictions: No    Therapy/Group: Individual Therapy  Hammad Finkler 03/11/2019, 2:03 PM

## 2019-03-11 NOTE — Progress Notes (Signed)
Physical Therapy Session Note  Patient Details  Name: [name]. MRN: [id number] Date of Birth: May 11, 1960  Today's Date: 03/11/2019 PT Individual Time: 1515-1610 PT Individual Time Calculation (min): 55 min    Short Term Goals: Week 1:  PT Short Term Goal 1 (Week 1): = to LTGs based on ELOS  Skilled Therapeutic Interventions/Progress Updates:    Pt supine in bed upon PT arrival, agreeable to therapy tx and denies pain. Pt transferred to sitting EOB independently. Pt transferred to standing and ambulated to the gym this session with rollator and supervision. Pt worked on community distance ambulation and pathfinding in order to ambulate throughout unit/hospital using rollator with supervision, >500 ft. Pt worked on dynamic standing balance this session without device to perform the following tasks: boxing activity, standing on bosu while performing lateral weightshifting, standing on bosu to perform mini squats, ambulation forwards/backwards, lateral toe taps on aerobic step, basketball shooting activity, picking ball up off the ground and ball toss against rebounder. Pt ambulated to steps without device and with CGA, pt ascended/descended 12 steps with B rails and supervision, cues for safety. Pt ambulated to the dayroom with supervision and then back to his room using rollator. Pt left supine in bed with needs in reach and bed alarm set.   Therapy Documentation Precautions:  Precautions Precautions: Fall Restrictions Weight Bearing Restrictions: No    Therapy/Group: Individual Therapy  Netta Corrigan, PT, DPT, CSRS 03/11/19  3:47 PM   Drema Dallas Schagen 03/11/2019, 12:04 PM

## 2019-03-11 NOTE — Progress Notes (Signed)
Occupational Therapy Session Note  Patient Details  Name: [name]. MRN: [id number] Date of Birth: [birth year]  Today's Date: 03/11/2019 OT Individual Time: 5809-9833 OT Individual Time Calculation (min): 66 min    Short Term Goals: Week 1:  OT Short Term Goal 1 (Week 1): NA STG = LTG  Skilled Therapeutic Interventions/Progress Updates:    Pt began session with transition from supine to sit with independence and then stand pivot transfer over to the sink for grooming task of shaving with supervision.  He was able to stand for entire task without rest break for approximately 20 mins.  He then transferred over to the shower with use of the rollator and supervision.  Min instructional cueing was given to remember to lock the wheelchair brakes.  He completed shower with min instructional cueing to remember to wash the LUE.  He transferred out to the bed for dressing at supervision level as well.  He next ambulated with the rollator down to the therapy gym with supervision, where he completed 6 mins of strengthening with use of the Nustep.  He was able to maintain an average number of steps at 72 with resistance set at 7.  HR 72 BPM after completion of exercises.  He returned to the room at the end of the session with transfer back to the bed to rest.  Call button and phone in reach with safety alarm in place.    Therapy Documentation Precautions:  Precautions Precautions: Fall Restrictions Weight Bearing Restrictions: No  Pain: Pain Assessment Pain Scale: 0-10 Pain Score: 5  Pain Type: Acute pain Pain Location: Leg Pain Orientation: Right Pain Descriptors / Indicators: Discomfort Pain Onset: With Activity Pain Intervention(s): Repositioned ADL: See Care Tool Section for some details of mobility and selfcare  Therapy/Group: Individual Therapy  Djuana Littleton OTR/L 03/11/2019, 10:54 AM

## 2019-03-11 NOTE — Progress Notes (Signed)
Physical Therapy Session Note  Patient Details  Name: [name]. MRN: [id number] Date of Birth: 11-Jun-1960  Today's Date: 03/11/2019 PT Individual Time: 1035-1105 PT Individual Time Calculation (min): 30 min   Short Term Goals: Week 1:  PT Short Term Goal 1 (Week 1): = to LTGs based on ELOS  Skilled Therapeutic Interventions/Progress Updates: Pt presented at EOB with NT present agreeable to therapy. Pt received new scrub pants as previous ones wet after going to bathroom. Pt doffed and donned pants with supervision and increased time. Pt ambulated to rehab gym with rollator supervision with cues to monitor speed as pt would significantly increase gait speed at times. Pt performed STS from balance board 3 x 5 without AD for BLE strengthening and for increased recruitment of ankle strategy upon standing. Pt also participated in use of boxing stand (with gloves) for dynamic balance. Pt was able to perform jabs, cross punches, hooks, and uppercuts crossing midline with CGA overall and no LOB. Pt then ambulated >341f with rollator supervision with cues for increased R foot clearance. Pt returned to room at end of session and left sitting on conversion under alarm pad with call bell within reach and needs met.      Therapy Documentation Precautions:  Precautions Precautions: Fall Restrictions Weight Bearing Restrictions: No General:   Vital Signs: Oxygen Therapy SpO2: 97 % O2 Device: Room Air Pain: Pain Assessment Pain Scale: 0-10 Pain Score: 5  Pain Type: Acute pain Pain Location: Leg Pain Orientation: Right Pain Descriptors / Indicators: Discomfort Pain Onset: With Activity Pain Intervention(s): Repositioned    Therapy/Group: Individual Therapy  Telesforo Brosnahan  Annai Heick, PTA  03/11/2019, 12:46 PM

## 2019-03-11 NOTE — Progress Notes (Signed)
Occupational Therapy Discharge Summary  Patient Details  Name: [name]. MRN: [id number] Date of Birth: 1961-01-30    Patient has met 51 of 14 long term goals due to improved activity tolerance, improved balance, ability to compensate for deficits, functional use of  RIGHT upper and RIGHT lower extremity, improved awareness and improved coordination.  Patient to discharge at overall Modified Independent level.  Patient's care partner unavailable to provide the necessary physical and cognitive assistance at discharge.     Recommendation:  Patient will benefit from ongoing skilled OT services in home health setting to continue to advance functional skills in the area of BADL and iADL.  Pt still demonstrates some central vestibular dysfunction with nystagmus noted vertically with tracking.  In addition, he continues to report increased light sensitivity and increased blurriness in the right eye especially.  Feel he will benefit from ongoing [place] to continue progression of ADL function back to baseline independent level as well as continuing to address current vestibular issues.    Equipment: No equipment provided  Reasons for discharge: treatment goals met and discharge from hospital  Patient/family agrees with progress made and goals achieved: Yes  OT Discharge Precautions/Restrictions  Precautions Precautions: Fall Restrictions Weight Bearing Restrictions: No ADL ADL Eating: Independent Where Assessed-Eating: Chair Grooming: Modified independent Where Assessed-Grooming: Standing at sink Upper Body Bathing: Modified independent Where Assessed-Upper Body Bathing: Multimedia programmer, Chair Lower Body Bathing: Modified independent Where Assessed-Lower Body Bathing: Shower, Chair Upper Body Dressing: Independent Where Assessed-Upper Body Dressing: Chair Lower Body Dressing: Independent Where Assessed-Lower Body Dressing: Chair, Edge of bed Toileting: Modified independent Where  Assessed-Toileting: Bedside Commode Toilet Transfer: Modified independent Toilet Transfer Method: Counselling psychologist: Engineer, technical sales Transfer: Modified independent Clinical cytogeneticist Method: Optometrist: Nurse, mental health Transfer: Modified independent Social research officer, government Method: Heritage manager: Radio broadcast assistant ADL Comments: cues for safety Vision Baseline Vision/History: Wears glasses Wears Glasses: At all times Patient Visual Report: Blurring of vision(pt reports blurring of vision in the right eye) Vision Assessment?: Yes Eye Alignment: Within Functional Limits Ocular Range of Motion: Within Functional Limits Alignment/Gaze Preference: Within Defined Limits Tracking/Visual Pursuits: Decreased smoothness of horizontal tracking;Decreased smoothness of vertical tracking;Other (comment)(Horizontal nystagmus noted with tracking) Convergence: Within functional limits Visual Fields: No apparent deficits Perception  Perception: Within Functional Limits Praxis Praxis: Intact Cognition Overall Cognitive Status: History of cognitive impairments - at baseline Arousal/Alertness: Awake/alert Orientation Level: Oriented X4 Attention: Focused;Sustained Focused Attention: Appears intact Memory: Impaired Memory Impairment: Decreased recall of new information;Decreased short term memory Awareness: Impaired Awareness Impairment: Anticipatory impairment Problem Solving: Impaired Problem Solving Impairment: Functional complex Safety/Judgment: Impaired Comments: Pt needs occasional cueing to remember to lock the rollator brakes and to reach back to the surface he is going to sit down on. Sensation Sensation Light Touch: Impaired Detail Peripheral sensation comments: R LE decreased light touch compared to L Hot/Cold: Appears Intact Proprioception: Appears Intact Coordination Gross Motor Movements  are Fluid and Coordinated: Yes Fine Motor Movements are Fluid and Coordinated: Yes Coordination and Movement Description: BUE coordination grossly [place] for selfcare tasks per observation Motor  Motor Motor - Discharge Observations: impaired balance, ataxia (improved) Mobility  Bed Mobility Bed Mobility: Supine to Sit;Sit to Supine Supine to Sit: Independent Sit to Supine: Independent Transfers Sit to Stand: Independent with assistive device Stand to Sit: Independent with assistive device  Trunk/Postural Assessment  Cervical Assessment Cervical Assessment: Within Functional Limits Thoracic Assessment Thoracic Assessment: Within Functional Limits Lumbar  Assessment Lumbar Assessment: Within Functional Limits  Balance Balance Balance Assessed: Yes Static Sitting Balance Static Sitting - Balance Support: Feet supported Static Sitting - Level of Assistance: 7: Independent Dynamic Sitting Balance Dynamic Sitting - Balance Support: Feet unsupported Dynamic Sitting - Level of Assistance: 7: Independent Static Standing Balance Static Standing - Balance Support: During functional activity Static Standing - Level of Assistance: 6: Modified independent (Device/Increase time) Dynamic Standing Balance Dynamic Standing - Balance Support: During functional activity;Bilateral upper extremity supported Dynamic Standing - Level of Assistance: 6: Modified independent (Device/Increase time) Extremity/Trunk Assessment RUE Assessment RUE Assessment: Within Functional Limits General Strength Comments: 4/5 LUE Assessment LUE Assessment: Within Functional Limits General Strength Comments: 4/5   MCGUIRE,JAMES OTR/L 03/11/2019, 5:22 PM

## 2019-03-11 NOTE — Progress Notes (Signed)
Social Work Patient ID: [name]., male   DOB: Mar 05, 1961, 58 y.o.   MRN: [id number]  Met with pt and spoke with wife via telephone to discuss team conference goals supervision-mod/i level and discharge 12/25. Discussed equipment and follow up. Made aware called parole officer and let her know the discharge date and they will follow up with him once home regarding ankle monitor.

## 2019-03-11 NOTE — Progress Notes (Addendum)
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  Denies CP, shortness of breath, nausea, vomiting diarrhea.  Objective: Vital Signs: Blood pressure (!) 145/80, pulse (!) 43, temperature 98.3 F (36.8 C), resp. rate 18, SpO2 97 %. No results found. Recent Labs    03/09/19 0740  WBC 6.9  HGB 16.2  HCT 48.3  PLT 189   Recent Labs    03/09/19 0740  NA 138  K 4.3  CL 104  CO2 23  GLUCOSE 157*  BUN 21*  CREATININE 1.19  CALCIUM 9.3    Physical Exam: BP (!) 145/80 (BP Location: Right Arm)   Pulse (!) 43   Temp 98.3 F (36.8 C)   Resp 18   SpO2 97%  Constitutional: No distress . Vital signs reviewed.  Obese. HENT: Normocephalic.  Atraumatic. Eyes: EOMI. No discharge. Cardiovascular: No JVD. Respiratory: Normal effort.  No stridor. GI: Non-distended. Skin: Warm and dry.  Intact. Psych: Normal mood.  Normal behavior. Musc: No edema in extremities.  No tenderness in extremities. Neurological: Alert Motor: Motor: 5/5 bilateral deltoid bicep tricep grip hip flexor knee extensor ankle dorsiflexion Mild dysmetria right finger-nose-finger  Assessment/Plan: 1. 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  Physiatrist is providing close team supervision and 24 hour management of active medical problems listed below.  Physiatrist and rehab team continue to assess barriers to discharge/monitor patient progress toward functional and medical goals  Care Tool:  Bathing    Body parts bathed by patient: Right arm, Left arm, Chest, Abdomen, Front perineal area, Right upper leg, Left upper leg, Face, Buttocks, Right lower leg, Left lower leg   Body parts bathed by helper: Buttocks, Right lower leg, Left lower  leg     Bathing assist Assist Level: Supervision/Verbal cueing     Upper Body Dressing/Undressing Upper body dressing   What is the patient wearing?: Pull over shirt    Upper body assist Assist Level: Set up assist    Lower Body Dressing/Undressing Lower body dressing      What is the patient wearing?: Pants     Lower body assist Assist for lower body dressing: Supervision/Verbal cueing     Toileting Toileting    Toileting assist Assist for toileting: Supervision/Verbal cueing     Transfers Chair/bed transfer  Transfers assist     Chair/bed transfer assist level: Supervision/Verbal cueing     Locomotion Ambulation   Ambulation assist      Assist level: Supervision/Verbal cueing Assistive device: Rollator Max distance: 100'   Walk 10 feet activity   Assist     Assist level: Contact Guard/Touching assist Assistive device: Rollator   Walk 50 feet activity   Assist    Assist level: Contact Guard/Touching assist Assistive device: Rollator    Walk 150 feet activity   Assist Walk 150 feet activity did not occur: Safety/medical concerns  Assist level: Contact Guard/Touching assist Assistive device: Rollator    Walk 10 feet on uneven surface  activity   Assist     Assist level: Minimal Assistance - Patient > 75%     Wheelchair     Assist Will patient use wheelchair at discharge?: No             Wheelchair 50 feet with 2  turns activity    Assist            Wheelchair 150 feet activity     Assist            Medical Problem List and Plan: 1.  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  Continue CIR PT,  OT,Team conference today please see physician documentation under team conference tab, met with team face-to-face to discuss problems,progress, and goals. Formulized individual treatment plan based on  medical history, underlying problem and comorbidities. 2.  Antithrombotics: -DVT/anticoagulation:  Pharmaceutical: Lovenox             -antiplatelet therapy: ASA/Plavix.  3. Pain Management: Tylenol prn.  4. Mood: LCSW to follow for evaluation and support.              -antipsychotic agents: N/A 5. Neuropsych: This patient is?  Fully capable of making decisions on his own behalf. 6. Skin/Wound Care: Routine pressure relief measures.  7. Fluids/Electrolytes/Nutrition: Monitor I/O.  CMP ordered. 8. T2DM with hyperglycemia: Hgb A1c- 8.6--poorly controlled. Will monitor BS ac/hs will continue SSI for now.  Invokana PTA, resumed. Current dose is 127m per day may need to increase to home dose of 3085mper day if non fasting CBG still above 180           CBG (last 3)  Recent Labs    03/10/19 1636 03/10/19 2133 03/11/19 0616  GLUCAP 134* 168* 152*  fair in hospital control  12/23 9. HTN: Monitor BP tid--continue to hold lisinopril              Vitals:   03/10/19 2011 03/11/19 0534  BP: 132/90 (!) 145/80  Pulse: (!) 54 (!) 43  Resp: 18 18  Temp: (!) 97.5 F (36.4 C) 98.3 F (36.8 C)  SpO2: 98% 97%  controlled  12/23 10. COPD: On singulair and pulmicort.  11. Dyslipidemia: Continue Lipitor and fenofibrate. 12. Phenylketonuria: Dietary modifications ordered.  Intellectual disability 13. Nausea/vomiting: Vestibular evaluation.    Eyepatch patch for diplopia if necessary.     Continue phenergan prn.   Improving 14.  Hiccups:  Improved 15. Constipation: Resolved with lactulose.  Senna at bedtime.   Improving 16.  Hypoalbuminemia  Supplement initiated on 12/19  LOS: 5 days A FACE TO FACE EVALUATION WAS PERFORMED  AnCharlett Blake2/23/2020, 9:18 AM

## 2019-03-12 ENCOUNTER — Inpatient Hospital Stay (HOSPITAL_COMMUNITY): Payer: Medicare Other | Admitting: Physical Therapy

## 2019-03-12 ENCOUNTER — Inpatient Hospital Stay (HOSPITAL_COMMUNITY): Payer: Medicare Other | Admitting: Occupational Therapy

## 2019-03-12 LAB — GLUCOSE, CAPILLARY
Glucose-Capillary: 130 mg/dL — ABNORMAL HIGH (ref 70–99)
Glucose-Capillary: 129 mg/dL — ABNORMAL HIGH (ref 70–99)
Glucose-Capillary: 133 mg/dL — ABNORMAL HIGH (ref 70–99)
Glucose-Capillary: 142 mg/dL — ABNORMAL HIGH (ref 70–99)

## 2019-03-12 NOTE — Progress Notes (Signed)
Physical Therapy Session Note  Patient Details  Name: [name]. MRN: [id number] Date of Birth: 07-Jun-1960  Today's Date: 03/12/2019 PT Individual Time: 0900-0955 PT Individual Time Calculation (min): 55 min   Short Term Goals: Week 1:  PT Short Term Goal 1 (Week 1): = to LTGs based on ELOS  Skilled Therapeutic Interventions/Progress Updates:   Pt received toileting and agreeable to therapy, denies pain. Nursing staff also present to provide medications. Therapist assisted pt w/ donning new belt around pants w/ theraband as scrub pants do not have belt loops. Ambulated around unit w/ rollator, mod/i, >150' at a time. Pt independent w/ all rollator parts management and demonstrated safe use. Negotiated 12 stairs w/ B rails w/ supervision, and performed car transfer w/ supervision. Pt needed to toilet again halfway through session and performed toilet transfer, mod/i, pt continent of bowel and bladder. Performed Berg Balance Scale as detailed in d/c summary, reinforced importance of AD use for balance, pt in agreement although does demonstrate mild impulsive behaviors at times. Returned to room and ended session in chair w/ chair alarm activated, all needs in reach.   Therapy Documentation Precautions:  Precautions Precautions: Fall Restrictions Weight Bearing Restrictions: No  Therapy/Group: Individual Therapy  Nikolina Simerson K Leisel Pinette 03/12/2019, 10:10 AM

## 2019-03-12 NOTE — Progress Notes (Signed)
Physical Therapy Session Note  Patient Details  Name: [name]. MRN: [id number] Date of Birth: [birth year]  Today's Date: 03/12/2019 PT Individual Time: 1425-1535 PT Individual Time Calculation (min): 70 min   Short Term Goals: Week 1:  PT Short Term Goal 1 (Week 1): = to LTGs based on ELOS  Skilled Therapeutic Interventions/Progress Updates:  Pt received in bed with nurse present administering medication. Pt transferred to sitting EOB with mod I with hospital bed features. Pt ambulates impulsively around unit with rollator with max cuing for decreased gait speed & increased safety, but pt very distracted by offset front L wheel - AD assessed by rehab tech & LCSW made aware of pt's request for DME company to assess wheel but at this time AD is safe to use. Pt engaged in pipe tree activity while standing with pt assembling moderately challenging designs from choice of many with min cuing, and significantly extra time for error correction & problem solving. Pt utilized dynavision while standing without BUE support with pt demonstrating very quick reaction time to all lights, then progressing to pushing only green lights & no red lights with pt successfully pressing 36/38 green lights & no red lights. Pt performed retrograde gait x 20 ft x 3 trials with min assist and 3 LOB with mod assist to correct with cuing for increased foot clearance with task focusing on high level dynamic balance, weight shifting L<>R, and NMR. Pt utilized nu-step on level 6 x 10 minutes with all four extremities with task focusing on global strengthening. After task pt denies pain, instead reporting feeling a burn in his arms, shoulders, & chest "like you feel after you workout". Pt remains perseverative on rollator wheel throughout session despite multiple attempts to redirect. Back in room pt changes into larger paper scrub top & is left in bed with alarm set & all needs in reach.  No c/o pain reported.  Therapy  Documentation Precautions:  Precautions Precautions: Fall Restrictions Weight Bearing Restrictions: No   Therapy/Group: Individual Therapy  Waunita Schooner 03/12/2019, 3:38 PM

## 2019-03-12 NOTE — Discharge Instructions (Signed)
Inpatient Rehab Discharge Instructions  [name]. Discharge date and time: 03/13/19   Activities/Precautions/ Functional Status: Activity: activity as tolerated Diet: diabetic diet Wound Care: none needed   Functional status:  ___ No restrictions     ___ Walk up steps independently ___ 24/7 supervision/assistance   ___ Walk up steps with assistance ___ Intermittent supervision/assistance  ___ Bathe/dress independently ___ Walk with walker     _x__ Bathe/dress with assistance ___ Walk Independently    ___ Shower independently ___ Walk with assistance    ___ Shower with assistance ___ No alcohol     ___ Return to work/school ________  Special Instructions: 1. No driving smoking or alcohol 2. Continue Plavix for 2 more weeks then stop. Continue to keep taking aspirin indefinitely for stroke prevention.    COMMUNITY REFERRALS UPON DISCHARGE:    Home Health:   PT  [facility]  Phone: [phone]  Date of last service:03/13/2019  Medical Equipment/Items Ordered:ROLLATOR Vassie Moselle  Agency/Supplier:[facility]   [phone]  Other:PAROLE OFFICER AWARE OF HIS DISCHARGE DATE AND WILL FOLLOW UP REGARDING ANKLE MONITOR-REPLACING     STROKE/TIA DISCHARGE INSTRUCTIONS SMOKING Cigarette smoking nearly doubles your risk of having a stroke & is the single most alterable risk factor  If you smoke or have smoked in the last 12 months, you are advised to quit smoking for your health.  Most of the excess cardiovascular risk related to smoking disappears within a year of stopping.  Ask you doctor about anti-smoking medications  [place] Quit Line: 1-800-QUIT NOW  Free Smoking Cessation Classes [phone]  CHOLESTEROL Know your levels; limit fat & cholesterol in your diet  Lipid Panel     Component Value Date/Time   CHOL 159 03/03/2019 0447   CHOL 136 12/02/2018 1408   TRIG 222 (H) 03/03/2019 0447   TRIG 145 12/02/2018 1408   HDL 30 (L) 03/03/2019 0447   HDL 34 (L) 08/27/2018 1604   CHOLHDL 5.3 03/03/2019 0447   VLDL 44 (H) 03/03/2019 0447   VLDL 229 (H) 12/02/2018 1408   [place] 85 03/03/2019 0447   [place] Comment 08/27/2018 1604      Many patients benefit from treatment even if their cholesterol is at goal.  Goal: Total Cholesterol (CHOL) less than 160  Goal:  Triglycerides (TRIG) less than 150  Goal:  HDL greater than 40  Goal:  LDL (LDLCALC) less than 100   BLOOD PRESSURE American Stroke Association blood pressure target is less that 120/80 mm/Hg  Your discharge blood pressure is:  BP: (!) 145/80  Monitor your blood pressure  Limit your salt and alcohol intake  Many individuals will require more than one medication for high blood pressure  DIABETES (A1c is a blood sugar average for last 3 months) Goal HGBA1c is under 7% (HBGA1c is blood sugar average for last 3 months)  Diabetes:   Lab Results  Component Value Date   HGBA1C 8.6 (H) 03/03/2019     Your HGBA1c can be lowered with medications, healthy diet, and exercise.  Check your blood sugar as directed by your physician  Call your physician if you experience unexplained or low blood sugars.  PHYSICAL ACTIVITY/REHABILITATION Goal is 30 minutes at least 4 days per week  Activity: Increase activity slowly, Therapies: Physical Therapy: Home Health Return to work:   Activity decreases your risk of heart attack and stroke and makes your heart stronger.  It helps control your weight and blood pressure; helps you relax and can improve  your mood.  Participate in a regular exercise program.  Talk with your doctor about the best form of exercise for you (dancing, walking, swimming, cycling).  DIET/WEIGHT Goal is to maintain a healthy weight  Your discharge diet is:  Diet Order            Diet Carb Modified Fluid consistency: Thin; Room service appropriate? Yes with Assist  Diet effective now              liquids Your height is:    Your current weight is:   Your  Body Mass Index (BMI) is:     Following the type of diet specifically designed for you will help prevent another stroke.  Your goal weight range is:    Your goal Body Mass Index (BMI) is 19-24.  Healthy food habits can help reduce 3 risk factors for stroke:  High cholesterol, hypertension, and excess weight.  RESOURCES Stroke/Support Group:  Call [phone]   STROKE EDUCATION PROVIDED/REVIEWED AND GIVEN TO PATIENT Stroke warning signs and symptoms How to activate emergency medical system (call 911). Medications prescribed at discharge. Need for follow-up after discharge. Personal risk factors for stroke. Pneumonia vaccine given:  Flu vaccine given:  My questions have been answered, the writing is legible, and I understand these instructions.  I will adhere to these goals & educational materials that have been provided to me after my discharge from the hospital.      My questions have been answered and I understand these instructions. I will adhere to these goals and the provided educational materials after my discharge from the hospital.  Patient/Caregiver Signature _______________________________ Date __________  Clinician Signature _______________________________________ Date __________  Please bring this form and your medication list with you to all your follow-up doctor's appointments.
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
## 2019-03-12 NOTE — Progress Notes (Signed)
Occupational Therapy Session Note  Patient Details  Name: [name]. MRN: [id number] Date of Birth: 1961-02-07  Today's Date: 03/12/2019 OT Individual Time: 1100-1158 OT Individual Time Calculation (min): 58 min    Short Term Goals: Week 1:  OT Short Term Goal 1 (Week 1): NA STG = LTG  Skilled Therapeutic Interventions/Progress Updates:    Pt seen for OT ADL bathing/dressing session. Pt sitting up in chair upon arrival, denying pain and agreeable to tx session, requesting shower.  Mobility/transfers: Ambulation and transfers completed throughout session at overall mod I level using rollator. Supervision for shower stall transfer with use of grab bar. Min cuing for rollator management in functional context. Ambulated throughout unit with rollator, min VCs for safety awareness.  ADL Re-training: Toileting task completed mod I. Bathed seated on TTB mod I He returned to chair to dress  Mod I overall though cuing required for attention to task.  Grooming tasks stand at sink mod I.  Pt left seated on couch at end of session, chair pad alarm activated. Therapist assisting pt in calling his wife and all needs in reach.   Therapy Documentation Precautions:  Precautions Precautions: Fall Restrictions Weight Bearing Restrictions: No Pain: Pain Assessment Pain Scale: 0-10 Pain Score: 0-No pain   Therapy/Group: Individual Therapy  Ameliyah Sarno L 03/12/2019, 6:31 AM

## 2019-03-12 NOTE — Progress Notes (Signed)
Social Work Discharge Note   The overall goal for the admission was met for: DC Friday 12/25  Discharge location: Yes-HOME WITH WIFE WHO IS ABLE TO BE THERE WITH HIM  Length of Stay: Yes-8 DAYS  Discharge activity level: Yes-MOD/I LEVEL  Home/community participation: Yes  Services provided included: MD, RD, PT, OT, SLP, RN, Pharmacy and SW  Financial Services: Private Insurance: [facility]  Follow-up services arranged: Home Health: [facility], DME: [facility]-ROLL[facility]LL[facility]ALK[facility] and Patient/Family has no preference for HH/DME agencies  Comments (or additional information):PT DID WELL AND PROGRESSED QUICKLY AND REACHED MOD/I LEVEL. PROBATION OFFICER AWARE OF DC AND WILL FOLLOW UP WITH HIM ONCE HOME FOR REPLACING ANKLE MONITOR. PT IS AWARE OF THIS. WIFE CAN BE THERE EWITH HIM AND BOTH FEEL READY FOR HIS DISCHARGE.  Patient/Family verbalized understanding of follow-up arrangements: YES  Individual responsible for coordination of the follow-up plan: SELF& [name]-WIFE  Confirmed correct DME delivered: Elease Hashimoto 03/12/2019    Elease Hashimoto

## 2019-03-13 LAB — GLUCOSE, CAPILLARY: Glucose-Capillary: 135 mg/dL — ABNORMAL HIGH (ref 70–99)

## 2019-03-13 NOTE — H&P (Signed)
Discharged to home accompanied by Father, discharge instructions given to patient yesterday and states an understanding of information provided. Medications from home were picked up from pharmacy and given to patient to take back home. Escorted out via wheelchair with Rolator. Margarito Liner

## 2019-03-13 NOTE — Progress Notes (Signed)
[facility] PHYSICAL MEDICINE & REHABILITATION PROGRESS NOTE  Subjective/Complaints:  Reviewed driving restriction    ROS: + Dizziness.  Denies CP, shortness of breath, nausea, vomiting diarrhea.  Objective: Vital Signs: Blood pressure (!) 152/87, pulse (!) 49, temperature 97.8 F (36.6 C), resp. rate 17, SpO2 97 %. No results found. No results for input(s): WBC, HGB, HCT, PLT in the last 72 hours. No results for input(s): NA, K, CL, CO2, GLUCOSE, BUN, CREATININE, CALCIUM in the last 72 hours.  Physical Exam: BP (!) 152/87 (BP Location: Left Arm)   Pulse (!) 49   Temp 97.8 F (36.6 C)   Resp 17   SpO2 97%  Constitutional: No distress . Vital signs reviewed.  Obese. HENT: Normocephalic.  Atraumatic. Eyes: EOMI. No discharge. Cardiovascular: No JVD. Respiratory: Normal effort.  No stridor. GI: Non-distended. Skin: Warm and dry.  Intact. Psych: Normal mood.  Normal behavior. Musc: No edema in extremities.  No tenderness in extremities. Neurological: Alert Motor: Motor: 5/5 bilateral deltoid bicep tricep grip hip flexor knee extensor ankle dorsiflexion Mild dysmetria right finger-nose-finger  Assessment/Plan: 1. 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 Stable for D/C today No driving for 1 mo  F/u PCP in 3-4 weeks See D/C summary See D/C instructions Care Tool:  Bathing    Body parts bathed by patient: Right arm, Left arm, Chest, Abdomen, Front perineal area, Right upper leg, Left upper leg, Face, Buttocks, Right lower leg, Left lower leg   Body parts bathed by helper: Buttocks, Right lower leg, Left lower leg     Bathing assist Assist Level: Independent with assistive device     Upper Body Dressing/Undressing Upper body dressing   What is the patient wearing?: Pull over shirt    Upper body assist Assist Level: Set up assist    Lower Body Dressing/Undressing Lower body dressing      What is the patient wearing?: Pants     Lower body  assist Assist for lower body dressing: Independent with assitive device     Toileting Toileting    Toileting assist Assist for toileting: Independent with assistive device     Transfers Chair/bed transfer  Transfers assist     Chair/bed transfer assist level: Independent with assistive device Chair/bed transfer assistive device: (rollator)   Locomotion Ambulation   Ambulation assist      Assist level: Independent with assistive device Assistive device: Rollator Max distance: >150'   Walk 10 feet activity   Assist     Assist level: Independent with assistive device Assistive device: Rollator   Walk 50 feet activity   Assist    Assist level: Independent with assistive device Assistive device: Rollator    Walk 150 feet activity   Assist Walk 150 feet activity did not occur: Safety/medical concerns  Assist level: Independent with assistive device Assistive device: Rollator    Walk 10 feet on uneven surface  activity   Assist     Assist level: Supervision/Verbal cueing Assistive device: Rollator   Wheelchair     Assist Will patient use wheelchair at discharge?: No   Wheelchair activity did not occur: N/A         Wheelchair 50 feet with 2 turns activity    Assist    Wheelchair 50 feet with 2 turns activity did not occur: N/A       Wheelchair 150 feet activity     Assist Wheelchair 150 feet activity did not occur: N/A  Medical Problem List and Plan: 1.  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  DC home today   2.  Antithrombotics: -DVT/anticoagulation:  Pharmaceutical: Lovenox             -antiplatelet therapy: ASA/Plavix.  3. Pain Management: Tylenol prn.  4. Mood: LCSW to follow for evaluation and support.              -antipsychotic agents: N/A 5. Neuropsych: This patient is?  Fully capable of  making decisions on his own behalf. 6. Skin/Wound Care: Routine pressure relief measures.  7. Fluids/Electrolytes/Nutrition: Monitor I/O.  CMP ordered. 8. T2DM with hyperglycemia: Hgb A1c- 8.6--poorly controlled. Will monitor BS ac/hs will continue SSI for now.  Invokana PTA, resumed. Current dose is 129m per day may need to increase to home dose of 3088mper day if non fasting CBG still above 180           CBG (last 3)  Recent Labs    03/12/19 1647 03/12/19 2143 03/13/19 0619  GLUCAP 133* 142* 135*  good  in hospital control  12/24 9. HTN: Monitor BP tid--continue to hold lisinopril              Vitals:   03/12/19 1943 03/13/19 0511  BP: 126/73 (!) 152/87  Pulse: 60 (!) 49  Resp: 18 17  Temp: 97.9 F (36.6 C) 97.8 F (36.6 C)  SpO2: 97% 97%  controlled  12/24 10. COPD: On singulair and pulmicort.  11. Dyslipidemia: Continue Lipitor and fenofibrate. 12. Phenylketonuria: Dietary modifications ordered.  Intellectual disability  13. Nausea/vomiting: Vestibular evaluation.    Eyepatch patch for diplopia if necessary.     Continue phenergan prn.   Improving 14.  Hiccups:  Improved 15. Constipation: Resolved with lactulose.  Senna at bedtime.   Improving 16.  Hypoalbuminemia  Supplement initiated on 12/19  LOS: 7 days A FACE TO FACE EVALUATION WAS PERFORMED  AnCharlett Blake2/25/2020, 9:00 AM

## 2019-03-16 ENCOUNTER — Telehealth: Payer: Self-pay

## 2019-03-16 NOTE — Telephone Encounter (Signed)
Transition Care Management Follow-up Telephone Call  Date of discharge and from where: [facility] 03/13/2019   How have you been since you were released from the hospital? "hes okay, he's trying to do a lot"  Any questions or concerns? Yes    Requesting to get medicaid restarted and heard about some charity information for helping pay for medical bills. Will forward call to [facility] as well.   Items Reviewed:  Did the pt receive and understand the discharge instructions provided? Yes   Medications obtained and verified? Yes   Any new allergies since your discharge? No   Dietary orders reviewed? Yes  Do you have support at home? Yes   Functional Questionnaire: (I = Independent and D = Dependent) ADLs: i  Bathing/Dressing- i  Meal Prep- i  Eating- i  Maintaining continence- i  Transferring/Ambulation- d, walker   Managing Meds- d  Follow up appointments reviewed:   PCP Hospital f/u appt confirmed? Yes  Scheduled to see Marnee Guarneri, NP on 1/6/02021 @ [facility] f/u appt confirmed? Yes    Are transportation arrangements needed? No   If their condition worsens, is the pt aware to call PCP or go to the Emergency Dept.? Yes  Was the patient provided with contact information for the PCP's office or ED? Yes  Was to pt encouraged to call back with questions or concerns? Yes

## 2019-03-16 NOTE — Discharge Summary (Signed)
Physician Discharge Summary  Patient ID: [name]. MRN: [id number] DOB/AGE: Oct 03, 1960 58 y.o.  Admit date: 03/06/2019 Discharge date: 03/13/2019  Discharge Diagnoses:  Principal Problem:   Brainstem infarct, acute [facility]) Active Problems:   Stroke (cerebrum) ([facility])   Hypoalbuminemia due to protein-calorie malnutrition (HCC)   Hiccups   Benign essential HTN   Discharged Condition: stable   Significant Diagnostic Studies: n/A    Labs:  Basic Metabolic Panel: BMP Latest Ref Rng & Units 03/09/2019 03/07/2019 03/06/2019  Glucose 70 - 99 mg/dL 157(H) 199(H) 196(H)  BUN 6 - 20 mg/dL 21(H) 11 12  Creatinine 0.61 - 1.24 mg/dL 1.19 1.05 1.08  BUN/Creat Ratio 9 - 20 - - -  Sodium 135 - 145 mmol/L 138 140 139  Potassium 3.5 - 5.1 mmol/L 4.3 4.2 4.0  Chloride 98 - 111 mmol/L 104 105 107  CO2 22 - 32 mmol/L _0 Calcium 8.9 - 10.3 mg/dL 9.3 9.0 8.8(L)    CBC: CBC Latest Ref Rng & Units 03/09/2019 03/07/2019 03/06/2019  WBC 4.0 - 10.5 K/uL 6.9 7.9 7.3  Hemoglobin 13.0 - 17.0 g/dL 16.2 15.8 15.5  Hematocrit 39.0 - 52.0 % 48.3 46.9 44.8  Platelets 150 - 400 K/uL 189 177 161    CBG: Recent Labs  Lab 03/12/19 0626 03/12/19 1214 03/12/19 1647 03/12/19 2143 03/13/19 0619  GLUCAP 130* 129* 133* 142* 135*    Brief HPI:   [name]. is a 58 y.o. male with history of HTN, T2DM, PKU with mental challenges who was admitted to [facility] on [facility]/19 with reports of fall 3 weeks PTA with HA and developed dizziness, weakness and problems with ambulation.  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 Therapy evaluations done revealing decreased sensation LUE with dizziness and poor safety awareness. CIR was recommended due to functional deficits.    Hospital Course: [name]. was admitted to rehab 03/06/2019 for inpatient therapies to consist of  PT and OT at least three hours five days a week. Past admission physiatrist, therapy team and rehab RN have worked together to provide customized collaborative inpatient rehab. He continues on ASA/Plavix for secondary stroke prevention and advise to stop Plavix after 2 weeks. His blood pressures were monitored on TID basis and are controlled overall off lisinopril. Protein supplement added for low calorie malnutrition. His diabetes has been monitored with ac/hs CBG checks and SSI was use prn for tighter BS control. Invokana was resumed at 100 mg daily. Eye patch was ordered to help with diplopia and to be used prn. Nausea has resolved and hiccups were improving. He is continent of bowel and bladder. Dietary modifications ordered for PKU. Follow up labs showed mild dehydration and he was encouraged to increase fluid intake. He  has made gains during rehab stay and is at modified independent level. Probation office contacted regarding discharge date and to replace ankle bracelet.  He will continue to receive follow up HHPT by [facility] after discharge   Rehab course: During patient's stay in rehab team conference was held to discuss patient's progress, set goals and discuss barriers to discharge. At admission, patient required min assist with basic ADL tasks and with mobility. He  has had improvement in activity tolerance, balance, postural control as well as ability to compensate for deficits.  He is able to complete ADL tasks  at modified independent level. He is modified independent for transfers and is able to ambulate >150' with RW. He is able to climb 12 stairs with supervision. Wife plans on providing assistance as needed after discharge.   Discharge disposition: 01-Home or Self Care  Diet: heart Healthy/Carb modified.   Special Instructions: 1. Stop Plavix after 2 weeks. 2. No driving or strenuous activity till cleared by MD. 3. Set follow up appointment with PCP for post hospital follow up in  1-2 weeks.   Discharge Instructions    Ambulatory referral to Neurology   Complete by: As directed    An appointment is requested in approximately 4 weeks right medullary infarction   Ambulatory referral to Physical Medicine Rehab   Complete by: As directed    Moderate complexity follow-up 1 to 2 weeks right medullary infarction     Allergies as of 03/13/2019      Reactions   Bee Venom Hives   All kinds of bees   Other    Certain powders   Sulfa Antibiotics       Medication List    STOP taking these medications   EPINEPHrine 0.3 mg/0.3 mL Soaj injection Commonly known as: EPI-PEN   lisinopril 20 MG tablet Commonly known as: ZESTRIL   meloxicam 15 MG tablet Commonly known as: MOBIC   senna-docusate 8.6-50 MG tablet Commonly known as: Senokot-S     TAKE these medications   acetaminophen 325 MG tablet Commonly known as: TYLENOL Take 1-2 tablets (325-650 mg total) by mouth every 4 (four) hours as needed for mild pain.   albuterol 108 (90 Base) MCG/ACT inhaler Commonly known as: ProAir HFA INHALE 2 PUFFS BY MOUTH EVERY 6 HOURS IF NEEDED FOR COUGH OR WHEEZING.   aspirin 81 MG EC tablet Take 1 tablet (81 mg total) by mouth daily.   atorvastatin 40 MG tablet Commonly known as: LIPITOR Take 1 tablet (40 mg total) by mouth daily at 6 PM.   canagliflozin 100 MG Tabs tablet Commonly known as: INVOKANA Take 1 tablet (100 mg total) by mouth daily before breakfast. What changed:   medication strength  how much to take   clobetasol cream 0.05 % Commonly known as: TEMOVATE Apply 1 application topically 2 (two) times daily. Apply to affected area What changed: additional instructions   clopidogrel 75 MG tablet Commonly known as: PLAVIX Take 1 tablet (75 mg total) by mouth daily.   fenofibrate 54 MG tablet Take 1 tablet (54 mg total) by mouth daily. What changed:   medication strength  how much to take   fluticasone 110 MCG/ACT inhaler Commonly known as:  FLOVENT HFA Inhale 1 puff into the lungs 2 (two) times daily. What changed: when to take this   gabapentin 300 MG capsule Commonly known as: NEURONTIN Take 1 capsule (300 mg total) by mouth 2 (two) times daily.   loratadine 10 MG tablet Commonly known as: CLARITIN Take 1 tablet (10 mg total) by mouth daily.   meclizine 12.5 MG tablet Commonly known as: ANTIVERT Take 1 tablet (12.5 mg total) by mouth 3 (three) times daily.   montelukast 10 MG tablet Commonly known as: SINGULAIR TAKE 1 TABLET BY MOUTH ONCE EVERY EVENING AT BEDTIME. What changed: See the new instructions.   nitroGLYCERIN 0.4 MG SL tablet Commonly known as: NITROSTAT Place 1 tablet (0.4 mg total) under the tongue every 5 (five) minutes as needed for chest pain.   omeprazole 20 MG capsule Commonly known as: PRILOSEC Take 1 capsule (20  mg total) by mouth daily.   OneTouch Delica Lancets Fine Misc TEST once daily   OneTouch Delica Lancets 51W Misc TEST ONCE A DAY.   OneTouch Ultra test strip Generic drug: glucose blood TEST ONCE DAILY   polyethylene glycol 17 g packet Commonly known as: MiraLax Take 17 g by mouth daily.   Vitamin D3 1.25 MG (50000 UT) Caps Take 50,000 Units by mouth once a week.      Follow-up Information    Kirsteins, Luanna Salk, MD Follow up.   Specialty: Physical Medicine and Rehabilitation Why: Office to call for appointment Contact information: [place] [phone]           Signed: Bary Leriche 03/16/2019, 1:18 AM

## 2019-03-17 ENCOUNTER — Telehealth: Payer: Self-pay

## 2019-03-17 NOTE — Telephone Encounter (Signed)
  TRANSITIONAL CARE CALL  Appointment Date and Time: 03-25-2019 / 1:00pm With: Zella Ball then Dr. Letta Pate  Questions for our staff to ask patients on Transitional care 48 hour phone call:   1. Are you/is patient experiencing any problems since coming home? NO     Are there any questions regarding any aspect of care?  NO  2. Are there any questions regarding medications administration/dosing? NO     Are meds being taken as prescribed? YES  3. Have there been any falls? NO  4. Has Home Health been to the house and/or have they contacted you?  YES     If not, have you tried to contact them? NA     Can we help you contact them? NA   5. Are bowels and bladder emptying properly?  YES     Are there any unexpected incontinence issues? NO     If applicable, is patient following bowel/bladder programs? NA  6. Any fevers, problems with breathing, unexpected pain? LITTLE NAUSEA, SOFT FOODS  7. Are there any skin problems or new areas of breakdown?  NO  8. Has the patient/family member arranged specialty MD follow up (ie cardiology/neurology/renal/surgical/etc)?      Can we help arrange? YES  9. Does the patient need any other services or support that we can help arrange? NO  10. Are caregivers following through as expected in assisting the patient? YES  11. Has the patient quit smoking, drinking alcohol, or using drugs as recommended? NA  [facility] [address] [phone]

## 2019-03-18 ENCOUNTER — Telehealth: Payer: Self-pay | Admitting: Nurse Practitioner

## 2019-03-18 NOTE — Telephone Encounter (Signed)
Okay for verbal orders, may call to notify.

## 2019-03-18 NOTE — Telephone Encounter (Signed)
Home Health Verbal Orders - Caller/Agency: Wes with [facility] Number: [phone] Requesting OT/PT/Skilled Nursing/Social Work/Speech Therapy: needing verbals for PT  Frequency:  2 week 5 1 week 1  Would like to add a Museum/gallery conservator and a OT for a eval.

## 2019-03-18 NOTE — Telephone Encounter (Signed)
Called and notified [name] that Jolene gave the approval for all verbals requested.

## 2019-03-18 NOTE — Telephone Encounter (Signed)
Routing to provider.

## 2019-03-22 ENCOUNTER — Encounter: Payer: Self-pay | Admitting: Nurse Practitioner

## 2019-03-22 DIAGNOSIS — I6523 Occlusion and stenosis of bilateral carotid arteries: Secondary | ICD-10-CM | POA: Insufficient documentation

## 2019-03-23 ENCOUNTER — Ambulatory Visit: Payer: Medicare Other

## 2019-03-23 ENCOUNTER — Ambulatory Visit: Payer: Self-pay | Admitting: Licensed Clinical Social Worker

## 2019-03-24 ENCOUNTER — Telehealth: Payer: Self-pay

## 2019-03-24 ENCOUNTER — Telehealth: Payer: Self-pay | Admitting: Nurse Practitioner

## 2019-03-24 NOTE — Telephone Encounter (Signed)
Called pt's wife regarding Insurance account manager for [employer]. Pt has recently had a stoke and is need of paying   [place] . Embedded Care Coordination [place]  Care Management ??[email or web address]  ??[phone]

## 2019-03-24 NOTE — Chronic Care Management (AMB) (Signed)
  Care Management   Follow Up Note   03/24/2019 Name: Bently Wyss. MRN: [id number] DOB: [birth year]  Referred by: [name], NP Reason for referral : Care Coordination   Delrae Alfred Trai Ells. is a 59 y.o. year old male who is a primary care patient of Cannady, Barbaraann Faster, NP. The care management team was consulted for assistance with care management and care coordination needs.    Review of patient status, including review of consultants reports, relevant laboratory and other test results, and collaboration with appropriate care team members and the patient's provider was performed as part of comprehensive patient evaluation and provision of chronic care management services.    LCSW completed CCM outreach attempt today but was unable to reach patient successfully. A HIPPA compliant voice message was left encouraging patient to follow up with [employer] for financial support/Medicaid enrollment. LCSW rescheduled CCM SW appointment as well.  A HIPPA compliant phone message was left for the patient providing contact information and requesting a return call.   Eula Fried, BSW, MSW, [facility] Care Management [email or web address] Phone: [phone]

## 2019-03-24 NOTE — Telephone Encounter (Signed)
Marlowe Kays OT [facility] called requesting verbal orders for 2wk1 then 1wk3.  Called her back and approved verbal orders.

## 2019-03-25 ENCOUNTER — Ambulatory Visit (INDEPENDENT_AMBULATORY_CARE_PROVIDER_SITE_OTHER): Payer: Medicare Other | Admitting: Nurse Practitioner

## 2019-03-25 ENCOUNTER — Encounter: Payer: Self-pay | Admitting: Nurse Practitioner

## 2019-03-25 ENCOUNTER — Other Ambulatory Visit: Payer: Self-pay

## 2019-03-25 ENCOUNTER — Encounter: Payer: Medicare Other | Admitting: Registered Nurse

## 2019-03-25 VITALS — BP 119/73 | HR 54 | Temp 97.7°F | Wt 169.2 lb

## 2019-03-25 DIAGNOSIS — E1169 Type 2 diabetes mellitus with other specified complication: Secondary | ICD-10-CM | POA: Diagnosis not present

## 2019-03-25 DIAGNOSIS — R809 Proteinuria, unspecified: Secondary | ICD-10-CM

## 2019-03-25 DIAGNOSIS — E6609 Other obesity due to excess calories: Secondary | ICD-10-CM

## 2019-03-25 DIAGNOSIS — E1159 Type 2 diabetes mellitus with other circulatory complications: Secondary | ICD-10-CM

## 2019-03-25 DIAGNOSIS — I6389 Other cerebral infarction: Secondary | ICD-10-CM | POA: Diagnosis not present

## 2019-03-25 DIAGNOSIS — E1165 Type 2 diabetes mellitus with hyperglycemia: Secondary | ICD-10-CM | POA: Diagnosis not present

## 2019-03-25 DIAGNOSIS — E785 Hyperlipidemia, unspecified: Secondary | ICD-10-CM

## 2019-03-25 DIAGNOSIS — Z6830 Body mass index (BMI) 30.0-30.9, adult: Secondary | ICD-10-CM

## 2019-03-25 DIAGNOSIS — E1129 Type 2 diabetes mellitus with other diabetic kidney complication: Secondary | ICD-10-CM | POA: Insufficient documentation

## 2019-03-25 DIAGNOSIS — E559 Vitamin D deficiency, unspecified: Secondary | ICD-10-CM

## 2019-03-25 DIAGNOSIS — I10 Essential (primary) hypertension: Secondary | ICD-10-CM

## 2019-03-25 DIAGNOSIS — I152 Hypertension secondary to endocrine disorders: Secondary | ICD-10-CM

## 2019-03-25 LAB — MICROALBUMIN, URINE WAIVED
Creatinine, Urine Waived: 50 mg/dL (ref 10–300)
Microalb, Ur Waived: 80 mg/L — ABNORMAL HIGH (ref 0–19)
Microalb/Creat Ratio: >300 mg/g — ABNORMAL HIGH (ref <30)

## 2019-03-25 MED ORDER — ONDANSETRON HCL 4 MG PO TABS: 4.0000 mg | ORAL_TABLET | Freq: Three times a day (TID) | ORAL | 0 refills | Status: DC | PRN

## 2019-03-25 MED ORDER — ARTIFICIAL TEARS 0.1-0.3 % OP SOLN: 1.0000 [drp] | Freq: Three times a day (TID) | OPHTHALMIC | 3 refills | Status: DC

## 2019-03-25 MED ORDER — MECLIZINE HCL 12.5 MG PO TABS: 12.5000 mg | ORAL_TABLET | Freq: Three times a day (TID) | ORAL | 0 refills | Status: DC

## 2019-03-25 MED ORDER — ASPIRIN 81 MG PO TBEC: 81.0000 mg | DELAYED_RELEASE_TABLET | Freq: Every day | ORAL | 0 refills | Status: DC

## 2019-03-25 NOTE — Patient Instructions (Addendum)
Hospital started Meclizine for nausea and dizziness after stroke.  I also ordered Zofran.  They stopped Lisinopril and Meloxicam + Senna.   They wanted him to stop Plavix after two weeks -- 03/27/2019 (Friday)  [place] AT [phone] -- Dr. Melrose Nakayama or Dr. Manuella Ghazi  Carbohydrate Counting for Diabetes Mellitus, Adult  Carbohydrate counting is a method of keeping track of how many carbohydrates you eat. Eating carbohydrates naturally increases the amount of sugar (glucose) in the blood. Counting how many carbohydrates you eat helps keep your blood glucose within normal limits, which helps you manage your diabetes (diabetes mellitus). It is important to know how many carbohydrates you can safely have in each meal. This is different for every person. A diet and nutrition specialist (registered dietitian) can help you make a meal plan and calculate how many carbohydrates you should have at each meal and snack. Carbohydrates are found in the following foods:  Grains, such as breads and cereals.  Dried beans and soy products.  Starchy vegetables, such as potatoes, peas, and corn.  Fruit and fruit juices.  Milk and yogurt.  Sweets and snack foods, such as cake, cookies, candy, chips, and soft drinks. How do I count carbohydrates? There are two ways to count carbohydrates in food. You can use either of the methods or a combination of both. Reading "Nutrition Facts" on packaged food The "Nutrition Facts" list is included on the labels of almost all packaged foods and beverages in the [place] It includes:  The serving size.  Information about nutrients in each serving, including the grams (g) of carbohydrate per serving. To use the "Nutrition Facts":  Decide how many servings you will have.  Multiply the number of servings by the number of carbohydrates per serving.  The resulting number is the total amount of carbohydrates that you will be having. Learning standard  serving sizes of other foods When you eat carbohydrate foods that are not packaged or do not include "Nutrition Facts" on the label, you need to measure the servings in order to count the amount of carbohydrates:  Measure the foods that you will eat with a food scale or measuring cup, if needed.  Decide how many standard-size servings you will eat.  Multiply the number of servings by 15. Most carbohydrate-rich foods have about 15 g of carbohydrates per serving. ? For example, if you eat 8 oz (170 g) of strawberries, you will have eaten 2 servings and 30 g of carbohydrates (2 servings x 15 g = 30 g).  For foods that have more than one food mixed, such as soups and casseroles, you must count the carbohydrates in each food that is included. The following list contains standard serving sizes of common carbohydrate-rich foods. Each of these servings has about 15 g of carbohydrates:   hamburger bun or  English muffin.   oz (15 mL) syrup.   oz (14 g) jelly.  1 slice of bread.  1 six-inch tortilla.  3 oz (85 g) cooked rice or pasta.  4 oz (113 g) cooked dried beans.  4 oz (113 g) starchy vegetable, such as peas, corn, or potatoes.  4 oz (113 g) hot cereal.  4 oz (113 g) mashed potatoes or  of a large baked potato.  4 oz (113 g) canned or frozen fruit.  4 oz (120 mL) fruit juice.  4-6 crackers.  6 chicken nuggets.  6 oz (170 g) unsweetened dry cereal.  6 oz (170 g) plain  fat-free yogurt or yogurt sweetened with artificial sweeteners.  8 oz (240 mL) milk.  8 oz (170 g) fresh fruit or one small piece of fruit.  24 oz (680 g) popped popcorn. Example of carbohydrate counting Sample meal  3 oz (85 g) chicken breast.  6 oz (170 g) brown rice.  4 oz (113 g) corn.  8 oz (240 mL) milk.  8 oz (170 g) strawberries with sugar-free whipped topping. Carbohydrate calculation 1. Identify the foods that contain  carbohydrates: ? Rice. ? Corn. ? Milk. ? Strawberries. 2. Calculate how many servings you have of each food: ? 2 servings rice. ? 1 serving corn. ? 1 serving milk. ? 1 serving strawberries. 3. Multiply each number of servings by 15 g: ? 2 servings rice x 15 g = 30 g. ? 1 serving corn x 15 g = 15 g. ? 1 serving milk x 15 g = 15 g. ? 1 serving strawberries x 15 g = 15 g. 4. Add together all of the amounts to find the total grams of carbohydrates eaten: ? 30 g + 15 g + 15 g + 15 g = 75 g of carbohydrates total. Summary  Carbohydrate counting is a method of keeping track of how many carbohydrates you eat.  Eating carbohydrates naturally increases the amount of sugar (glucose) in the blood.  Counting how many carbohydrates you eat helps keep your blood glucose within normal limits, which helps you manage your diabetes.  A diet and nutrition specialist (registered dietitian) can help you make a meal plan and calculate how many carbohydrates you should have at each meal and snack. This information is not intended to replace advice given to you by your health care provider. Make sure you discuss any questions you have with your health care provider. Document Revised: 09/27/2016 Document Reviewed: 08/17/2015 Elsevier Patient Education  [place].

## 2019-03-25 NOTE — Assessment & Plan Note (Addendum)
Acute with improvement presenting.  Continue at home OT/PT.  Wife provided with number for [facility] neuro.  She is to call them back to schedule.  Continue ASA and discontinue Plavix on Friday.  Goals A1C <6.5%, LDL <70, and BP <130/90.  Recommend use of Meclizine as needed, sent from hospital, and will send in Zofran for occasional nausea.

## 2019-03-25 NOTE — Assessment & Plan Note (Signed)
Continues to have weight loss intentionally.  BMI now 29.04.  Praised for loss.  Recommend continued focus on healthy diet choices and regular physical activity (30 minutes 5 days a week).

## 2019-03-25 NOTE — Progress Notes (Signed)
BP 119/73 (BP Location: Left Arm, Patient Position: Sitting, Cuff Size: Normal)    Pulse (!) 54    Temp 97.7 F (36.5 C) (Oral)    Wt 169 lb 3.2 oz (76.7 kg)    SpO2 96%    BMI 29.04 kg/m    Subjective:    Patient ID: [name]., male    DOB: [birth year], 59 y.o.   MRN: [id number]  HPI: [name]. is a 59 y.o. male  Chief Complaint  Patient presents with   Hospitalization Follow-up   Wife present at bedside to assist with HPI and ROS.  Transition of Care Hospital Follow up.   "Brief HPI:   [name]. is a 59 y.o. male with history of HTN, T2DM, PKU with mental challenges who was admitted to [facility] on 03/02/19 with reports of fall 3 weeks PTA with HA and developed dizziness, weakness and problems with ambulation.  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 Therapy evaluations done revealing decreased sensation LUE with dizziness and poor safety awareness. CIR was recommended due to functional deficits.    Hospital Course: Rameen Quinney. was admitted to rehab 03/06/2019 for inpatient therapies to consist of PT and OT at least three hours five days a week. Past admission physiatrist, therapy team and rehab RN have worked together to provide customized collaborative inpatient rehab. He continues on ASA/Plavix for secondary stroke prevention and advise to stop Plavix after 2 weeks. His blood pressures were monitored on TID basis and are controlled overall off lisinopril. Protein supplement added for low calorie malnutrition. His diabetes has been monitored with ac/hs CBG checks and SSI was use prn for tighter BS control. Invokana was resumed at 100 mg daily. Eye patch was ordered to help with diplopia and to be used prn. Nausea has resolved and hiccups were improving. He is continent of bowel and bladder. Dietary modifications ordered for PKU.  Follow up labs showed mild dehydration and he was encouraged to increase fluid intake. He  has made gains during rehab stay and is at modified independent level. Probation office contacted regarding discharge date and to replace ankle bracelet.  He will continue to receive follow up HHPT by [facility] at home after discharge   Rehab course: During patient's stay in rehab team conference was held to discuss patient's progress, set goals and discuss barriers to discharge. At admission, patient required min assist with basic ADL tasks and with mobility. He  has had improvement in activity tolerance, balance, postural control as well as ability to compensate for deficits.  He is able to complete ADL tasks at modified independent level. He is modified independent for transfers and is able to ambulate >150' with RW. He is able to climb 12 stairs with supervision. Wife plans on providing assistance as needed after discharge.   Discharge disposition: 01-Home or Self Care  Diet: heart Healthy/Carb modified.   Special Instructions: 1. Stop Plavix after 2 weeks. 2. No driving or strenuous activity till cleared by MD. 3. Set follow up appointment with PCP for post hospital follow up in 1-2 weeks. "  Hospital/Facility: [facility] D/C Physician: Reesa Chew PA-C D/C Date: 03/13/2019  Records Requested: 03/24/2018 Records Received: 03/24/2018 Records Reviewed:  03/24/2018  Diagnoses on Discharge:  Principal Problem:   Brainstem infarct, acute [facility]) Active Problems:   Stroke (cerebrum) ([place])  Hypoalbuminemia due to protein-calorie malnutrition (HCC)   Hiccups   Benign essential HTN  Date of interactive Contact within 48 hours of discharge:  Contact was through: phone  Date of 7 day or 14 day face-to-face visit:    within 7 days  Outpatient Encounter Medications as of 03/25/2019  Medication Sig   acetaminophen (TYLENOL) 325 MG tablet Take 1-2 tablets (325-650 mg total) by mouth every 4 (four) hours as  needed for mild pain.   albuterol (PROAIR HFA) 108 (90 Base) MCG/ACT inhaler INHALE 2 PUFFS BY MOUTH EVERY 6 HOURS IF NEEDED FOR COUGH OR WHEEZING.   aspirin EC 81 MG EC tablet Take 1 tablet (81 mg total) by mouth daily.   atorvastatin (LIPITOR) 40 MG tablet Take 1 tablet (40 mg total) by mouth daily at 6 PM.   canagliflozin (INVOKANA) 100 MG TABS tablet Take 1 tablet (100 mg total) by mouth daily before breakfast.   Cholecalciferol (VITAMIN D3) 1.25 MG (50000 UT) CAPS Take 50,000 Units by mouth once a week.   clobetasol cream (TEMOVATE) 0.93 % Apply 1 application topically 2 (two) times daily. Apply to affected area   clopidogrel (PLAVIX) 75 MG tablet Take 1 tablet (75 mg total) by mouth daily.   fenofibrate 54 MG tablet Take 1 tablet (54 mg total) by mouth daily.   fluticasone (FLOVENT HFA) 110 MCG/ACT inhaler Inhale 1 puff into the lungs 2 (two) times daily.   gabapentin (NEURONTIN) 300 MG capsule Take 1 capsule (300 mg total) by mouth 2 (two) times daily.   loratadine (CLARITIN) 10 MG tablet Take 1 tablet (10 mg total) by mouth daily.   meclizine (ANTIVERT) 12.5 MG tablet Take 1 tablet (12.5 mg total) by mouth 3 (three) times daily.   montelukast (SINGULAIR) 10 MG tablet TAKE 1 TABLET BY MOUTH ONCE EVERY EVENING AT BEDTIME.   nitroGLYCERIN (NITROSTAT) 0.4 MG SL tablet Place 1 tablet (0.4 mg total) under the tongue every 5 (five) minutes as needed for chest pain.   omeprazole (PRILOSEC) 20 MG capsule Take 1 capsule (20 mg total) by mouth daily.   OneTouch Delica Lancets 23F MISC TEST ONCE A DAY.   ONETOUCH DELICA LANCETS FINE MISC TEST once daily   ONETOUCH ULTRA test strip TEST ONCE DAILY   polyethylene glycol (MIRALAX) 17 g packet Take 17 g by mouth daily. (Patient not taking: Reported on 03/25/2019)   No facility-administered encounter medications on file as of 03/25/2019.    Diagnostic Tests Reviewed/Disposition:  Labs:  Basic Metabolic Panel: BMP Latest Ref Rng &  Units 03/09/2019 03/07/2019 03/06/2019  Glucose 70 - 99 mg/dL 157(H) 199(H) 196(H)  BUN 6 - 20 mg/dL 21(H) 11 12  Creatinine 0.61 - 1.24 mg/dL 1.19 1.05 1.08  BUN/Creat Ratio 9 - 20 - - -  Sodium 135 - 145 mmol/L 138 140 139  Potassium 3.5 - 5.1 mmol/L 4.3 4.2 4.0  Chloride 98 - 111 mmol/L 104 105 107  CO2 22 - 32 mmol/L _0 Calcium 8.9 - 10.3 mg/dL 9.3 9.0 8.8(L)    CBC: CBC Latest Ref Rng & Units 03/09/2019 03/07/2019 03/06/2019  WBC 4.0 - 10.5 K/uL 6.9 7.9 7.3  Hemoglobin 13.0 - 17.0 g/dL 16.2 15.8 15.5  Hematocrit 39.0 - 52.0 % 48.3 46.9 44.8  Platelets 150 - 400 K/uL 189 177 161    CLINICAL DATA:  Acute infarct right medulla  EXAM: CT ANGIOGRAPHY HEAD AND NECK  TECHNIQUE: Multidetector CT imaging of the head and neck was performed  using the standard protocol during bolus administration of intravenous contrast. Multiplanar CT image reconstructions and MIPs were obtained to evaluate the vascular anatomy. Carotid stenosis measurements (when applicable) are obtained utilizing NASCET criteria, using the distal internal carotid diameter as the denominator.  CONTRAST:  22m OMNIPAQUE IOHEXOL 350 MG/ML SOLN  COMPARISON:  MRI head and CT head 03/02/2019  FINDINGS: CT HEAD FINDINGS  Brain: No evidence of acute infarction, hemorrhage, hydrocephalus, extra-axial collection or mass lesion/mass effect. Acute infarct right medulla not identified by CT.  Vascular: Negative for hyperdense vessel  Skull: Negative  Sinuses: Negative  Orbits: Negative  Review of the MIP images confirms the above findings  CTA NECK FINDINGS  Aortic arch: Standard branching. Imaged portion shows no evidence of aneurysm or dissection. No significant stenosis of the major arch vessel origins.  Right carotid system: Mild atherosclerotic disease right carotid bulb without significant right carotid stenosis  Left carotid system: Atherosclerotic disease proximal left  internal carotid artery narrowing the lumen to 2.8 mm corresponding to approximately 20% diameter stenosis.  Vertebral arteries: Left vertebral dominant. Left vertebral artery widely patent to the basilar  Small right vertebral artery which ends in PICA. No prior studies available for comparison. Given the right medullary infarct, is not possible acute occlusion distal right vertebral artery or a congenital variation.  Skeleton: No acute skeletal abnormality.  Other neck: Negative  Upper chest: Negative  Review of the MIP images confirms the above findings  CTA HEAD FINDINGS  Anterior circulation: Cavernous carotid widely patent bilaterally without stenosis. Anterior and middle cerebral arteries patent bilaterally without stenosis.  Posterior circulation: Left vertebral arteries widely patent to the basilar. Left PICA not visualized. Left AICA is patent.  Distal right vertebral artery ends in PICA. This is small vessel in this could be congenital variation however there is an acute right PICA infarct. The basilar is patent. Superior cerebellar and posterior cerebral arteries are patent bilaterally posterior communicating arteries are patent bilaterally.  Venous sinuses: Patent  Anatomic variants: None  Review of the MIP images confirms the above findings  IMPRESSION: 1. Acute infarct right medulla not identified by CT. 2. Small right vertebral artery which ends in PICA. This could be congenital variation however acute occlusion distal right vertebral artery is a consideration given acute right PICA infarct. 3. 20% diameter stenosis proximal left internal carotid artery. No significant right carotid stenosis.   Electronically Signed   By: CFranchot GalloM.D.   On: 03/02/2019 13:54  CLINICAL DATA:  CVA.  EXAM: BILATERAL CAROTID DUPLEX ULTRASOUND  TECHNIQUE: GPearline Cablesscale imaging, color Doppler and duplex ultrasound were performed of bilateral  carotid and vertebral arteries in the neck.  COMPARISON:  None.  FINDINGS: Criteria: Quantification of carotid stenosis is based on velocity parameters that correlate the residual internal carotid diameter with NASCET-based stenosis levels, using the diameter of the distal internal carotid lumen as the denominator for stenosis measurement.  The following velocity measurements were obtained:  RIGHT  ICA: 80/19 cm/sec  CCA: 938/75cm/sec  SYSTOLIC ICA/CCA RATIO:  0.9  ECA: 139 cm/sec  LEFT  ICA: 70/14 cm/sec  CCA: 864/33cm/sec  SYSTOLIC ICA/CCA RATIO:  0.8  ECA: 60 cm/sec  RIGHT CAROTID ARTERY: Small amount of heterogeneous plaque at the right carotid bulb. External carotid artery is patent with normal waveform. Small amount of plaque in the proximal internal carotid artery. Normal waveforms and velocities in the internal carotid artery.  RIGHT VERTEBRAL ARTERY: Antegrade flow and normal waveform in the right  vertebral artery.  LEFT CAROTID ARTERY: Small amount of plaque at the left carotid bulb. External carotid artery is patent with normal waveform. Small amount of plaque in the proximal internal carotid artery. Normal waveforms and velocities in the internal carotid artery.  LEFT VERTEBRAL ARTERY: Antegrade flow and normal waveform in the left vertebral artery.  IMPRESSION: 1. Mild atherosclerotic disease in the bilateral carotid arteries. Estimated degree of stenosis in the internal carotid arteries is less than 50% bilaterally. 2. Patent vertebral arteries with antegrade flow.   Electronically Signed   By: Markus Daft M.D.   On: 03/02/2019 14:18  Consults: Neurology  Discharge Instructions: Follow-up with PCP and neurology  Disease/illness Education: reviewed with patient and his wife  Home Health/Community Services Discussions/Referrals: continue at this time  Establishment or re-establishment of referral orders for community  resources: has home health  Discussion with other health care providers: reviewed notes  Assessment and Support of treatment regimen adherence: reviewed with patient  Appointments Coordinated with: reviewed with patient  Education for self-management, independent living, and ADLs: reviewed with patient  DIABETES Continues on Invokana 100 MG. Recent A1C in hospital was 8.6, but his wife reports this was due to drinking a lot of sodas at home, which he is no longer doing.  Sugars have improved since returning home.   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  Stop date for Plavix is 03/27/2019.  Sees Dr. Humphrey Rolls for cardiology, next in February.  His wife reports [facility] neurology attempted to call her but she did not answer, provided her with number to schedule with them.  From CVA does have some mild eye lag, is receiving PT/OT at home and doing well per his wife's report.  Educated them on medication changes and need to stop Plavix upcoming, on Friday, but continue ASA.  Denies headache, slurred speech, or facial droop.  He had been advised to go to ER prior to CVA, after head trauma, but reports at time his dad would not take him there and his wife does not drive.  His wife reports when recent event happen  patient's dad continued to refuse to take him to ER and they called EMS.  He continues to have some occasional nausea and dizziness, which they were told were from CVA. Satisfied with current treatment? yes Duration of hypertension: chronic BP monitoring frequency: daily BP range:  BP medication side effects: no Duration of hyperlipidemia: chronic Cholesterol medication side effects: no Cholesterol supplements: none Medication compliance: good compliance Aspirin: yes Recent stressors: no Recurrent headaches: no Visual changes: no Palpitations: no Dyspnea: no Chest pain: no Lower extremity edema: no Dizzy/lightheaded: no  Relevant past medical, surgical, family and social history reviewed and updated as indicated. Interim medical history since our last visit reviewed. Allergies and medications reviewed and updated.  Review of Systems  Constitutional: Negative for activity change, diaphoresis, fatigue and fever.  Respiratory: Negative for cough, chest tightness, shortness of breath and wheezing.   Cardiovascular: Negative for chest pain, palpitations and leg swelling.  Gastrointestinal: Positive for nausea (occasional). Negative for abdominal distention, abdominal pain, constipation, diarrhea and vomiting.  Endocrine: Negative for cold intolerance, heat intolerance, polydipsia, polyphagia and polyuria.  Neurological: Positive for dizziness (occasional) and weakness (mild, improving). Negative for tremors, syncope, facial asymmetry, speech difficulty, light-headedness, numbness and headaches.  Psychiatric/Behavioral: Negative.     Per HPI unless specifically indicated above     Objective:    BP 119/73 (BP Location: Left Arm, Patient Position: Sitting, Cuff Size: Normal)    Pulse (!) 54    Temp 97.7 F (36.5 C) (Oral)    Wt 169 lb 3.2 oz (76.7 kg)    SpO2 96%    BMI 29.04 kg/m   Wt Readings from Last 3 Encounters:  03/25/19 169 lb 3.2 oz (76.7 kg)  03/02/19 176 lb 2.4 oz  (79.9 kg)  12/02/18 177 lb (80.3 kg)    Physical Exam Vitals and nursing note reviewed.  Constitutional:      General: He is awake. He is not in acute distress.    Appearance: He is well-developed and overweight. He is not ill-appearing.  HENT:     Head: Normocephalic and atraumatic.     Right Ear: Hearing normal. No drainage.     Left Ear: Hearing normal. No drainage.  Eyes:     General: Lids are normal.        Right eye: No discharge.        Left eye: No discharge.     Extraocular Movements: Extraocular movements intact.     Conjunctiva/sclera:     Right eye: Right conjunctiva is injected. No exudate.    Left eye: Left conjunctiva is not injected. No exudate.    Pupils: Pupils are equal, round, and reactive to light.     Visual Fields: Right eye visual fields normal and left eye visual fields normal.  Neck:     Thyroid: No thyromegaly.     Vascular: No carotid bruit.  Cardiovascular:     Rate and Rhythm: Normal rate and regular rhythm.     Heart sounds: Normal heart sounds, S1 normal and S2 normal. No murmur. No gallop.   Pulmonary:     Effort: Pulmonary effort is normal. No accessory muscle usage or respiratory distress.     Breath sounds: Normal breath sounds.  Abdominal:     General: Bowel sounds are normal. There is no distension.     Palpations: Abdomen is soft. There is no hepatomegaly.     Tenderness: There is no abdominal tenderness.  Musculoskeletal:        General: Normal range of motion.     Cervical back: Normal range of motion and neck supple.     Right lower leg: No edema.     Left lower leg: No edema.  Skin:    General: Skin is warm and dry.     Capillary Refill: Capillary refill takes less than 2 seconds.  Neurological:     Mental Status: He is alert and oriented to person, place, and time.     Cranial Nerves: Cranial nerves are intact.     Motor: Motor function is intact.     Coordination: Coordination is intact.     Gait: Gait is intact.     Deep  Tendon Reflexes:     Reflex  Scores:      Brachioradialis reflexes are 3+ on the right side and 2+ on the left side.      Patellar reflexes are 2+ on the right side and 2+ on the left side.    Comments: Mild lid lag on right side, patient rubbing at eye frequently during exam.  Psychiatric:        Attention and Perception: Attention normal.        Mood and Affect: Mood normal.        Speech: Speech normal.        Behavior: Behavior normal. Behavior is cooperative.     Results for orders placed or performed in visit on 03/25/19  Microalbumin, Urine Waived here  Result Value Ref Range   Microalb, Ur Waived 80 (H) 0 - 19 mg/L   Creatinine, Urine Waived 50 10 - 300 mg/dL   Microalb/Creat Ratio >300 (H) <30 mg/g      Assessment & Plan:   Problem List Items Addressed This Visit      Cardiovascular and Mediastinum   Hypertension associated with diabetes ([place])    Chronic, stable with BP at goal in office and at home.  Goal < 130/90.  Lisinopril discontinued in hospital, will plan on restart at low dose upcoming to allow kidney protection with diabetes.  Currently no BP medications on board.  Check CMP and TSH today.  Return in 4 weeks for BP check.      Relevant Medications   aspirin 81 MG EC tablet   Other Relevant Orders   CBC with Differential/Platelet out   Comprehensive metabolic panel   Microalbumin, Urine Waived here (Completed)   TSH     Endocrine   Hyperlipidemia associated with type 2 diabetes mellitus (HCC)    Chronic, ongoing.  Continue Lipitor and Fenofibrate. Lipid panel and CMP today.  Adjust doses as needed, if LDL >70 increase Lipitor to 80 MG daily.        Relevant Medications   aspirin 81 MG EC tablet   Other Relevant Orders   Comprehensive metabolic panel   Lipid Panel w/o Chol/HDL Ratio out   Uncontrolled type 2 diabetes mellitus with hyperglycemia (HCC)    Chronic, ongoing.  December A1C in hospital 8.6%.  Had been making poor diet choices.  At this time  since home, has had BS below goal.  Continue Invokana.  Poor tolerance to Metformin in past and allergic to Sulfa.  Will plan for follow-up in 4 weeks and repeat A1C in March.  If BS remain below goal next visit will maintain regimen until repeat A1C, but if elevations will consider initiation of GLP (may need wife to assist him with this due to his learning disabilities).  Goal A1C <6.5%      Relevant Medications   aspirin 81 MG EC tablet   Other Relevant Orders   CBC with Differential/Platelet out   Microalbumin, Urine Waived here (Completed)   Type 2 diabetes mellitus with proteinuria (HCC)    Chronic, ongoing.  Lisinopril discontinued in hospital, will plan restart in upcoming weeks at low dose if patient stable.  Followed by nephrology in past, Dr. Gerilyn Nestle, wife and him wish to return.  Urine ALB 80 and A:C > 300 today.  Goal A1C <6.5%, refer to hyperglycemia plan.      Relevant Medications   aspirin 81 MG EC tablet   Other Relevant Orders   Ambulatory referral to Nephrology     Nervous and Auditory   Brainstem  infarct, acute ([place]) - Primary    Acute with improvement presenting.  Continue at home OT/PT.  Wife provided with number for [facility] neuro.  She is to call them back to schedule.  Continue ASA and discontinue Plavix on Friday.  Goals A1C <6.5%, LDL <70, and BP <130/90.  Recommend use of Meclizine as needed, sent from hospital, and will send in Zofran for occasional nausea.        Other   Vitamin D deficiency    Recheck Vit D today.      Relevant Orders   Vit D  25 hydroxy (rtn osteoporosis monitoring)   Obesity    Continues to have weight loss intentionally.  BMI now 29.04.  Praised for loss.  Recommend continued focus on healthy diet choices and regular physical activity (30 minutes 5 days a week).           Follow up plan: Return in about 4 weeks (around 04/22/2019) for HTN and diabetes follow-up.

## 2019-03-25 NOTE — Assessment & Plan Note (Signed)
Recheck Vit D today.

## 2019-03-25 NOTE — Assessment & Plan Note (Addendum)
Chronic, ongoing.  December A1C in hospital 8.6%.  Had been making poor diet choices.  At this time since home, has had BS below goal.  Continue Invokana.  Poor tolerance to Metformin in past and allergic to Sulfa.  Will plan for follow-up in 4 weeks and repeat A1C in March.  If BS remain below goal next visit will maintain regimen until repeat A1C, but if elevations will consider initiation of GLP (may need wife to assist him with this due to his learning disabilities).  Goal A1C <6.5%

## 2019-03-25 NOTE — Assessment & Plan Note (Signed)
Chronic, ongoing.  Lisinopril discontinued in hospital, will plan restart in upcoming weeks at low dose if patient stable.  Followed by nephrology in past, Dr. Gerilyn Nestle, wife and him wish to return.  Urine ALB 80 and A:C > 300 today.  Goal A1C <6.5%, refer to hyperglycemia plan.

## 2019-03-25 NOTE — Assessment & Plan Note (Signed)
Chronic, ongoing.  Continue Lipitor and Fenofibrate. Lipid panel and CMP today.  Adjust doses as needed, if LDL >70 increase Lipitor to 80 MG daily.

## 2019-03-25 NOTE — Assessment & Plan Note (Deleted)
Acute with improvement presenting.  Continue at home OT/PT.  Wife provided with number for [facility] neuro.  She is to call them back to schedule.  Continue ASA and discontinue Plavix on Friday.  Goals A1C <6.5%, LDL <70, and BP <130/90.

## 2019-03-25 NOTE — Assessment & Plan Note (Addendum)
Chronic, stable with BP at goal in office and at home.  Goal < 130/90.  Lisinopril discontinued in hospital, will plan on restart at low dose upcoming to allow kidney protection with diabetes.  Currently no BP medications on board.  Check CMP and TSH today.  Return in 4 weeks for BP check.

## 2019-03-26 ENCOUNTER — Other Ambulatory Visit: Payer: Self-pay | Admitting: Nurse Practitioner

## 2019-03-26 DIAGNOSIS — E559 Vitamin D deficiency, unspecified: Secondary | ICD-10-CM

## 2019-03-26 LAB — CBC WITH DIFFERENTIAL/PLATELET
Basophils Absolute: 0 10*3/uL (ref 0.0–0.2)
Basos: 1 %
EOS (ABSOLUTE): 0.1 10*3/uL (ref 0.0–0.4)
Eos: 2 %
Hematocrit: 48.5 % (ref 37.5–51.0)
Hemoglobin: 16.2 g/dL (ref 13.0–17.7)
Immature Grans (Abs): 0 10*3/uL (ref 0.0–0.1)
Immature Granulocytes: 0 %
Lymphocytes Absolute: 1.2 10*3/uL (ref 0.7–3.1)
Lymphs: 20 %
MCH: 31.2 pg (ref 26.6–33.0)
MCHC: 33.4 g/dL (ref 31.5–35.7)
MCV: 93 fL (ref 79–97)
Monocytes Absolute: 0.4 10*3/uL (ref 0.1–0.9)
Monocytes: 7 %
Neutrophils Absolute: 4.3 10*3/uL (ref 1.4–7.0)
Neutrophils: 70 %
Platelets: 174 10*3/uL (ref 150–450)
RBC: 5.2 x10E6/uL (ref 4.14–5.80)
RDW: 13.3 % (ref 11.6–15.4)
WBC: 6 10*3/uL (ref 3.4–10.8)

## 2019-03-26 LAB — TSH: TSH: 0.994 u[IU]/mL (ref 0.450–4.500)

## 2019-03-26 LAB — COMPREHENSIVE METABOLIC PANEL
ALT: 31 IU/L (ref 0–44)
AST: 26 IU/L (ref 0–40)
Albumin/Globulin Ratio: 2 (ref 1.2–2.2)
Albumin: 4.9 g/dL (ref 3.8–4.9)
Alkaline Phosphatase: 173 IU/L — ABNORMAL HIGH (ref 39–117)
BUN/Creatinine Ratio: 12 (ref 9–20)
BUN: 13 mg/dL (ref 6–24)
Bilirubin Total: 0.4 mg/dL (ref 0.0–1.2)
CO2: 20 mmol/L (ref 20–29)
Calcium: 9.6 mg/dL (ref 8.7–10.2)
Chloride: 101 mmol/L (ref 96–106)
Creatinine, Ser: 1.05 mg/dL (ref 0.76–1.27)
GFR calc Af Amer: 91 mL/min/{1.73_m2} (ref >59)
GFR calc non Af Amer: 79 mL/min/{1.73_m2} (ref >59)
Globulin, Total: 2.5 g/dL (ref 1.5–4.5)
Glucose: 240 mg/dL — ABNORMAL HIGH (ref 65–99)
Potassium: 4.9 mmol/L (ref 3.5–5.2)
Sodium: 139 mmol/L (ref 134–144)
Total Protein: 7.4 g/dL (ref 6.0–8.5)

## 2019-03-26 LAB — LIPID PANEL W/O CHOL/HDL RATIO
Cholesterol, Total: 177 mg/dL (ref 100–199)
HDL: 51 mg/dL (ref >39)
LDL Chol Calc (NIH): 104 mg/dL — ABNORMAL HIGH (ref 0–99)
Triglycerides: 125 mg/dL (ref 0–149)
VLDL Cholesterol Cal: 22 mg/dL (ref 5–40)

## 2019-03-26 LAB — VITAMIN D 25 HYDROXY (VIT D DEFICIENCY, FRACTURES): Vit D, 25-Hydroxy: 26.6 ng/mL — ABNORMAL LOW (ref 30.0–100.0)

## 2019-03-26 MED ORDER — VITAMIN D3 1.25 MG (50000 UT) PO CAPS: 50000.0000 [IU] | ORAL_CAPSULE | ORAL | 5 refills | Status: DC

## 2019-03-26 MED ORDER — ATORVASTATIN CALCIUM 80 MG PO TABS: 80.0000 mg | ORAL_TABLET | Freq: Every day | ORAL | 3 refills | Status: DC

## 2019-03-26 NOTE — Progress Notes (Signed)
Atorvastatin increase to 80 MG for stroke prevention as LDL above goal.  Vitamin D supplement.

## 2019-03-26 NOTE — Progress Notes (Signed)
May speak to his wife.  Let her know his labs have returned.  I did place kidney doctor referral and please ensure she schedules with neurology for patient at [facility], number is on wrap up sheets from yesterday. - CBC looks good and thyroid testing normal. - Glucose was elevated on labs, we will see what A1C says in March and if elevated may need to add on medication.   - Cholesterol levels are above goal for stroke prevention.  I am going to send in an increase in his cholesterol medication to the pharmacy, Atorvastatin 80 MG, I wish him to start taking this daily. - Vitamin D is mildly low, take Vitamin D3 1000 units daily.  I will see if I can send this in. Have a great day and let me know if any questions.

## 2019-04-07 ENCOUNTER — Ambulatory Visit (INDEPENDENT_AMBULATORY_CARE_PROVIDER_SITE_OTHER): Payer: Medicare Other | Admitting: Pharmacist

## 2019-04-07 DIAGNOSIS — E785 Hyperlipidemia, unspecified: Secondary | ICD-10-CM | POA: Diagnosis not present

## 2019-04-07 DIAGNOSIS — J4530 Mild persistent asthma, uncomplicated: Secondary | ICD-10-CM

## 2019-04-07 DIAGNOSIS — E1169 Type 2 diabetes mellitus with other specified complication: Secondary | ICD-10-CM

## 2019-04-07 DIAGNOSIS — E1165 Type 2 diabetes mellitus with hyperglycemia: Secondary | ICD-10-CM | POA: Diagnosis not present

## 2019-04-07 DIAGNOSIS — E700 Classical phenylketonuria: Secondary | ICD-10-CM

## 2019-04-07 DIAGNOSIS — E701 Other hyperphenylalaninemias: Secondary | ICD-10-CM

## 2019-04-07 NOTE — Patient Instructions (Signed)
Visit Information  Goals Addressed            This Visit's Progress     Patient Stated   . PharmD "I want to work on my diabetes" (pt-stated)       Current Barriers:  . 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 Wife notes that he continues to have some trouble swallowing/throat pain, and some mobility issues. Notes he did not recognize his OT when they came to the house the other day o Reports neurology appt scheduled w/ Dr. Melrose Nakayama 05/21/19 o Reports an evening where she was worried about hyperglycemia to 400; she was advised by patient's father to give an additional Invokana tablet . Current antihyperglycemic regimen: Invokana 100 mg daily o Metformin d/c d/t bump in Scr after restarted o Avoiding sulfonylurea d/t sulfa allergy  . Reports drinking mostly water, some ginger ale to settle stomach (cannot drink diet d/t PKU) 1 cup of Mt Dew  . Current blood glucose readings: o Reports some readings to the 300-400s after meals, reporting nausea, but no vomiting or diarrhea. Other readings closer to gaol. . Cardiovascular risk reduction (follows w/ Dr. Humphrey Rolls, next appt 04/27/19) o Current hypertensive regimen: none; held lisinopril after hospitalization o Current hyperlipidemia regimen: atorvastatin 80 mg (just increased form 40 mg by PCP); fenofibrate 48 mg daily o Current antiplatelet regimen: ASA 81 mg daily; confirms that clopidogrel was d/c s/p 3 weeks of DAPT . Chronic kidney disease: reports f/u scheduled w/ Dr. Juleen China 05/06/2019 . Allergies/asthma: Flovent HFA daily, albuterol PRN wheezing/SOB; loratidine 10 mg daily and montelukast 10 mg daily  . Health maintenance: Vitamin D 50,000 IU weekly   Pharmacist Clinical Goal(s):  [name] Over the next 90 days, patient with work with PharmD and primary care provider to address optimized  medication management  Interventions: . Comprehensive medication review performed, medication list updated in electronic medical record.  . Reviewed upcoming appointments (nephrology, cardiology, neurology, PCP) with patient's wife . Reviewed that d/t prolonged duration/onset of SGLT2, an extra tab will not provide immediate glucose lowering. Suspect that nausea is more related to after effects of CVA, but have low threshold for worry of DKA. [name] confirms that patient is hydrating as normal, and has no vomiting or diarrhea. Counseled to continue to urge patient to hydrate w/ water, and if he has a day that he is not adequately orally hydrating, to hold SGLT2 to lower risk of euglycemic DKA. [name] verbalized understanding. Will continue to collaborate w/ PCP regarding glycemic management. Agree that next steps are GLP1 vs basal insulin.  . Reviewed recent medication changes. [name] verbalizes understanding.  . Provided reassurance to [name] regarding patient's recovery  Patient Self Care Activities:  . Patient will check blood glucose daily , document, and provide at future appointments . Patient will take medications as prescribed . Patient will report any questions or concerns to provider   Please see past updates related to this goal by clicking on the "Past Updates" button in the selected goal         The patient verbalized understanding of instructions provided today and declined a print copy of patient instruction materials.   Plan:  - Scheduled f/u call 05/19/19  Catie Darnelle Maffucci, PharmD, [place] [phone]

## 2019-04-07 NOTE — Chronic Care Management (AMB) (Signed)
Chronic Care Management   Follow Up Note   04/07/2019 Name: [name]. MRN: [id number] DOB: 24-Feb-1961  Referred by: Alex Lick, NP Reason for referral : Chronic Care Management (Medication Management)   [name]. is a 59 y.o. year old male who is a primary care patient of Cannady, Alex Faster, NP. The CCM team was consulted for assistance with chronic disease management and care coordination needs.    Contacted patient's wife, [name], for medication management review.   Review of patient status, including review of consultants reports, relevant laboratory and other test results, and collaboration with appropriate care team members and the patient's provider was performed as part of comprehensive patient evaluation and provision of chronic care management services.    SDOH (Social Determinants of Health) screening performed today: Social Connections Stress. See Care Plan for related entries.   Outpatient Encounter Medications as of 04/07/2019  Medication Sig  . albuterol (PROAIR HFA) 108 (90 Base) MCG/ACT inhaler INHALE 2 PUFFS BY MOUTH EVERY 6 HOURS IF NEEDED FOR COUGH OR WHEEZING.  [name] aspirin 81 MG EC tablet Take 1 tablet (81 mg total) by mouth daily.  [name] atorvastatin (LIPITOR) 80 MG tablet Take 1 tablet (80 mg total) by mouth daily.  . canagliflozin (INVOKANA) 100 MG TABS tablet Take 1 tablet (100 mg total) by mouth daily before breakfast.  . Cholecalciferol (VITAMIN D3) 1.25 MG (50000 UT) CAPS Take 50,000 Units by mouth once a week.  . fenofibrate 54 MG tablet Take 1 tablet (54 mg total) by mouth daily.  . fluticasone (FLOVENT HFA) 110 MCG/ACT inhaler Inhale 1 puff into the lungs 2 (two) times daily.  [name] gabapentin (NEURONTIN) 300 MG capsule Take 1 capsule (300 mg total) by mouth 2 (two) times daily.  [name] loratadine (CLARITIN) 10 MG tablet Take 1 tablet (10 mg total) by mouth daily.  . meclizine (ANTIVERT) 12.5 MG tablet Take 1 tablet (12.5 mg total) by mouth  3 (three) times daily.  . montelukast (SINGULAIR) 10 MG tablet TAKE 1 TABLET BY MOUTH ONCE EVERY EVENING AT BEDTIME.  [name] omeprazole (PRILOSEC) 20 MG capsule Take 1 capsule (20 mg total) by mouth daily.  . ondansetron (ZOFRAN) 4 MG tablet Take 1 tablet (4 mg total) by mouth every 8 (eight) hours as needed for nausea or vomiting.  [name] DELICA LANCETS FINE MISC TEST once daily  . ONETOUCH ULTRA test strip TEST ONCE DAILY  . acetaminophen (TYLENOL) 325 MG tablet Take 1-2 tablets (325-650 mg total) by mouth every 4 (four) hours as needed for mild pain.  . clobetasol cream (TEMOVATE) 4.09 % Apply 1 application topically 2 (two) times daily. Apply to affected area  . Dextran 70-Hypromellose (ARTIFICIAL TEARS) 0.1-0.3 % SOLN Apply 1 drop to eye 3 (three) times daily. To right eye only. (Patient not taking: Reported on 04/07/2019)  . nitroGLYCERIN (NITROSTAT) 0.4 MG SL tablet Place 1 tablet (0.4 mg total) under the tongue every 5 (five) minutes as needed for chest pain. (Patient not taking: Reported on 04/07/2019)  . polyethylene glycol (MIRALAX) 17 g packet Take 17 g by mouth daily. (Patient not taking: Reported on 03/25/2019)  . [DISCONTINUED] clopidogrel (PLAVIX) 75 MG tablet Take 1 tablet (75 mg total) by mouth daily.  . [DISCONTINUED] OneTouch Delica Lancets 81X MISC TEST ONCE A DAY.   No facility-administered encounter medications on file as of 04/07/2019.     Objective:   Goals Addressed  This Visit's Progress     Patient Stated   . PharmD "I want to work on my diabetes" (pt-stated)       Current Barriers:  . 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 Wife notes that he continues to have some trouble swallowing/throat pain, and some mobility issues. Notes he did not recognize his OT when they came to the house the other  day o Reports neurology appt scheduled w/ Dr. Melrose Nakayama 05/21/19 o Reports an evening where she was worried about hyperglycemia to 400; she was advised by patient's father to give an additional Invokana tablet . Current antihyperglycemic regimen: Invokana 100 mg daily o Metformin d/c d/t bump in Scr after restarted o Avoiding sulfonylurea d/t sulfa allergy  . Reports drinking mostly water, some ginger ale to settle stomach (cannot drink diet d/t PKU) 1 cup of Mt Dew  . Current blood glucose readings: o Reports some readings to the 300-400s after meals, reporting nausea, but no vomiting or diarrhea. Other readings closer to gaol. . Cardiovascular risk reduction (follows w/ Dr. Humphrey Rolls, next appt 04/27/19) o Current hypertensive regimen: none; held lisinopril after hospitalization o Current hyperlipidemia regimen: atorvastatin 80 mg (just increased form 40 mg by PCP); fenofibrate 48 mg daily o Current antiplatelet regimen: ASA 81 mg daily; confirms that clopidogrel was d/c s/p 3 weeks of DAPT . Chronic kidney disease: reports f/u scheduled w/ Dr. Juleen China 05/06/2019 . Allergies/asthma: Flovent HFA daily, albuterol PRN wheezing/SOB; loratidine 10 mg daily and montelukast 10 mg daily  . Health maintenance: Vitamin D 50,000 IU weekly   Pharmacist Clinical Goal(s):  [name] Over the next 90 days, patient with work with PharmD and primary care provider to address optimized medication management  Interventions: . Comprehensive medication review performed, medication list updated in electronic medical record.  . Reviewed upcoming appointments (nephrology, cardiology, neurology, PCP) with patient's wife . Reviewed that d/t prolonged duration/onset of SGLT2, an extra tab will not provide immediate glucose lowering. Suspect that nausea is more related to after effects of CVA, but have low threshold for worry of DKA. [name] confirms that patient is hydrating as normal, and has no vomiting or diarrhea. Counseled to continue  to urge patient to hydrate w/ water, and if he has a day that he is not adequately orally hydrating, to hold SGLT2 to lower risk of euglycemic DKA. [name] verbalized understanding. Will continue to collaborate w/ PCP regarding glycemic management. Agree that next steps are GLP1 vs basal insulin.  . Reviewed recent medication changes. [name] verbalizes understanding.  . Provided reassurance to [name] regarding patient's recovery  Patient Self Care Activities:  . Patient will check blood glucose daily , document, and provide at future appointments . Patient will take medications as prescribed . Patient will report any questions or concerns to provider   Please see past updates related to this goal by clicking on the "Past Updates" button in the selected goal          Plan:  - Scheduled f/u call 05/19/19  Catie Darnelle Maffucci, PharmD, [place] [phone]

## 2019-04-13 ENCOUNTER — Telehealth: Payer: Self-pay

## 2019-04-16 ENCOUNTER — Telehealth: Payer: Self-pay | Admitting: Nurse Practitioner

## 2019-04-16 NOTE — Telephone Encounter (Signed)
Pt   Best number  [phone]

## 2019-04-16 NOTE — Telephone Encounter (Signed)
Noted and will continue with recommendation.

## 2019-04-16 NOTE — Telephone Encounter (Signed)
Verbal orders called in

## 2019-04-16 NOTE — Telephone Encounter (Signed)
Copied from Ali [place] [id number]. Topic: General - Other >> Apr 16, 2019 11:13 AM Keene Breath wrote: Reason for CRM: Called to inform  the doctor that patient reports last night he had acute headache, right lower instability and weakness.  Today he reports he feels a little better but his balance is worse and has had trouble controlling blood sugar.  Please call to discuss at [phone]

## 2019-04-16 NOTE — Telephone Encounter (Signed)
Home Health Verbal Orders - Caller/Agency: Stephanie// [facility] Callback Number: [phone] secure  Requesting OT/PT/Skilled Nursing/Social Work/Speech Therapy: Nursing Evaluation Frequency: N/A

## 2019-04-16 NOTE — Telephone Encounter (Signed)
Please alert him I recommend ER visit today due to the acute headache yesterday and continued worse balance today with poor blood sugar control.  With his recent CVA I want to ensure no worsening or acute event present.  He needs to be seen today.

## 2019-04-16 NOTE — Telephone Encounter (Signed)
Called and spoke with wife. Pt is stating he is feeling fine and refusing to be seen today. Wife stated if he shows any further sx she will just call rescue.

## 2019-04-16 NOTE — Telephone Encounter (Signed)
Also alert his wife of this, as she may be able to get him to ER or assist in getting him there.  Thank you.

## 2019-04-20 ENCOUNTER — Encounter: Payer: Self-pay | Admitting: Nurse Practitioner

## 2019-04-20 NOTE — Telephone Encounter (Signed)
Sending information for Medicaid Application for [name] and [facility], pt's wife is concerned about mounting hospital bills.

## 2019-04-23 ENCOUNTER — Ambulatory Visit (INDEPENDENT_AMBULATORY_CARE_PROVIDER_SITE_OTHER): Payer: Medicare Other | Admitting: General Practice

## 2019-04-23 DIAGNOSIS — E1165 Type 2 diabetes mellitus with hyperglycemia: Secondary | ICD-10-CM | POA: Diagnosis not present

## 2019-04-23 DIAGNOSIS — I6389 Other cerebral infarction: Secondary | ICD-10-CM

## 2019-04-23 DIAGNOSIS — I10 Essential (primary) hypertension: Secondary | ICD-10-CM | POA: Diagnosis not present

## 2019-04-23 DIAGNOSIS — E700 Classical phenylketonuria: Secondary | ICD-10-CM

## 2019-04-23 DIAGNOSIS — E701 Other hyperphenylalaninemias: Secondary | ICD-10-CM

## 2019-04-23 NOTE — Chronic Care Management (AMB) (Signed)
Chronic Care Management   Follow Up Note   04/23/2019 Name: Robey Massmann. MRN: [id number] DOB: 12-29-1960  Referred by: [name], NP Reason for referral : Chronic Care Management (DM/HTN/POST CVA/PKU)   [name] is a 59 y.o. year old male who is a primary care patient of Cannady, Barbaraann Faster, NP. The CCM team was consulted for assistance with chronic disease management and care coordination needs.    Review of patient status, including review of consultants reports, relevant laboratory and other test results, and collaboration with appropriate care team members and the patient's provider was performed as part of comprehensive patient evaluation and provision of chronic care management services.    SDOH (Social Determinants of Health) screening performed today: Pharmacist, hospital Insecurity  Depression   Stress. See Care Plan for related entries.   Outpatient Encounter Medications as of 04/23/2019  Medication Sig   acetaminophen (TYLENOL) 325 MG tablet Take 1-2 tablets (325-650 mg total) by mouth every 4 (four) hours as needed for mild pain.   albuterol (PROAIR HFA) 108 (90 Base) MCG/ACT inhaler INHALE 2 PUFFS BY MOUTH EVERY 6 HOURS IF NEEDED FOR COUGH OR WHEEZING.   aspirin 81 MG EC tablet Take 1 tablet (81 mg total) by mouth daily.   atorvastatin (LIPITOR) 80 MG tablet Take 1 tablet (80 mg total) by mouth daily.   canagliflozin (INVOKANA) 100 MG TABS tablet Take 1 tablet (100 mg total) by mouth daily before breakfast.   Cholecalciferol (VITAMIN D3) 1.25 MG (50000 UT) CAPS Take 50,000 Units by mouth once a week.   clobetasol cream (TEMOVATE) 5.62 % Apply 1 application topically 2 (two) times daily. Apply to affected area   Dextran 70-Hypromellose (ARTIFICIAL TEARS) 0.1-0.3 % SOLN Apply 1 drop to eye 3 (three) times daily. To right eye only. (Patient not taking: Reported on 04/07/2019)   fenofibrate 54 MG tablet Take 1 tablet (54 mg total) by  mouth daily.   fluticasone (FLOVENT HFA) 110 MCG/ACT inhaler Inhale 1 puff into the lungs 2 (two) times daily.   gabapentin (NEURONTIN) 300 MG capsule Take 1 capsule (300 mg total) by mouth 2 (two) times daily.   loratadine (CLARITIN) 10 MG tablet Take 1 tablet (10 mg total) by mouth daily.   meclizine (ANTIVERT) 12.5 MG tablet Take 1 tablet (12.5 mg total) by mouth 3 (three) times daily.   montelukast (SINGULAIR) 10 MG tablet TAKE 1 TABLET BY MOUTH ONCE EVERY EVENING AT BEDTIME.   nitroGLYCERIN (NITROSTAT) 0.4 MG SL tablet Place 1 tablet (0.4 mg total) under the tongue every 5 (five) minutes as needed for chest pain. (Patient not taking: Reported on 04/07/2019)   omeprazole (PRILOSEC) 20 MG capsule Take 1 capsule (20 mg total) by mouth daily.   ondansetron (ZOFRAN) 4 MG tablet Take 1 tablet (4 mg total) by mouth every 8 (eight) hours as needed for nausea or vomiting.   ONETOUCH DELICA LANCETS FINE MISC TEST once daily   ONETOUCH ULTRA test strip TEST ONCE DAILY   polyethylene glycol (MIRALAX) 17 g packet Take 17 g by mouth daily. (Patient not taking: Reported on 03/25/2019)   No facility-administered encounter medications on file as of 04/23/2019.     Objective:   Lab Results  Component Value Date   HGBA1C 8.6 (H) 03/03/2019   BP Readings from Last 3 Encounters:  03/25/19 119/73  03/13/19 (!) 152/87  03/08/19 128/71    Goals Addressed  This Visit's Progress    RN-Fredrico is still drinking a lot of sodas and eating a lot of meat (pt-stated)       Current Barriers:   Knowledge Deficits related to diabetic diet, post CVA and HTN diet, and PKU diet   Transportation Barriers- currently can not drive due to CVA 16/60; his father takes him to  his appointments and other places to get food  Film/video editor.   Literacy barriers  Cognitive Deficits  Unable to travel to specialists outside of [facility] related to law enforced electronic monitoring.    Nurse Case Manager Clinical Goal(s):   Over the next 90 days, patient will work with [facility]  to address needs related to learning what patient can eat on Diabetic, [place] healthy, and PKU diet   Interventions:   Evaluation of current treatment plan related to DM, HTN, Post CVA and PKU and patient's adherence to plan as established by provider.  Provided education to patient re: related to flavored water options to try due to limitations and not being able to drink diet drinks due to PKU. The patient likes to drink Mt. Dew and sometimes drinks more than he should  Discussed plans with patient for ongoing care management follow up and provided patient with direct contact information for care management team  Advised patient, providing education and rationale, to monitor blood pressure daily and record, calling pcp for findings outside established parameters.   Provided patient with ADA/Heart Healthy educational materials related to compliance concerns with following a ADA/Heart Healthy Diet  Advised patient, providing education and rationale, to check cbg BID or more frequently as needed and record, calling pcp for findings outside established parameters.    Patient Self Care Activities:   Patient needs continued support in his efforts towards adherence to general PKU guidelines within the recommendations for a diabetic diet and heart Healthy options   Please see past updates related to this goal by clicking on the "Past Updates" button in the selected goal       RN: Bennet has blood sugars ranging in 300 to 500       Current Barriers:   Knowledge Deficits related to Diabetes management and keeping blood sugars in a normal range  Financial Constraints.   Literacy barriers  Cognitive Deficits  Nurse Case Manager Clinical Goal(s):   Over the next 90 days, patient will verbalize understanding of plan for making lifestyle changes that will impact his Diabetes diagnosis and decrease  his blood glucose levels to optimal level  Over the next 90 days, patient will work with RNCM, pcp, and interdisciplinary team  to address needs related to Diabestes   Over the next 90 days, patient will demonstrate a decrease in hyperglycemic episodes exacerbations as evidenced by blood sugars consistently <200 and following recommended guidelines established by pcp (per his spouse blood sugar levels have been 300-500)  Over the next 90 days, patient will attend all scheduled medical appointments: 04-27-2019 cardiologist, 04-29-2019 pcp  Over the next 90 days, patient will demonstrate improved adherence to prescribed treatment plan for diabetes as evidenced byimproved dietary habits related to ADA diet   Interventions:   Evaluation of current treatment plan related to diabetes managagement  and patient's adherence to plan as established by provider.  Advised patient to cut back on sugary drinks and sodas and drink more water, education on flavored water options available   Discussed plans with patient for ongoing care management follow up and provided patient with direct contact  information for care management team  Provided patient with ADA diet educational materials related to Compliance with ADA diet   Advised patient, providing education and rationale, to check cbg BID and record, calling pcp for findings outside established parameters.  Per the patients wife his blood sugars have been 300-500 at times  Patient Self Care Activities:   Patient verbalizes understanding of plan to work toward managing diabetes more effectively by following a ADA diet and making healthier choices to manage his chronic health conditions  Calls provider office for new concerns or questions  Unable to independently follow recommendations and adhere to ADA diet and effectively manage diabetes care  Initial goal documentation         Plan:   Telephone follow up appointment with care management team  member scheduled for:06-19-2019 1 pm   Noreene Larsson RN, MSN, [facility] Mobile: [phone]

## 2019-04-23 NOTE — Patient Instructions (Signed)
Visit Information  Goals Addressed            This Visit's Progress   . RN-Jimmy is still drinking a lot of sodas and eating a lot of meat (pt-stated)       Current Barriers:  [name] Knowledge Deficits related to diabetic diet, post CVA and HTN diet, and PKU diet  . Transportation Barriers- currently can not drive due to CVA 67/61; his father takes him to  his appointments and other places to get food . Film/video editor.  . Literacy barriers . Cognitive Deficits . Unable to travel to specialists outside of [facility] related to law enforced electronic monitoring.   Nurse Case Manager Clinical Goal(s):  [name] Over the next 90 days, patient will work with [facility]  to address needs related to learning what patient can eat on Diabetic, [place] healthy, and PKU diet   Interventions:  . Evaluation of current treatment plan related to DM, HTN, Post CVA and PKU and patient's adherence to plan as established by provider. . Provided education to patient re: related to flavored water options to try due to limitations and not being able to drink diet drinks due to PKU. The patient likes to drink Mt. Dew and sometimes drinks more than he should . Discussed plans with patient for ongoing care management follow up and provided patient with direct contact information for care management team . Advised patient, providing education and rationale, to monitor blood pressure daily and record, calling pcp for findings outside established parameters.  . Provided patient with ADA/Heart Healthy educational materials related to compliance concerns with following a ADA/Heart Healthy Diet . Advised patient, providing education and rationale, to check cbg BID or more frequently as needed and record, calling pcp for findings outside established parameters.    Patient Self Care Activities:  . Patient needs continued support in his efforts towards adherence to general PKU guidelines within the recommendations for a  diabetic diet and heart Healthy options   Please see past updates related to this goal by clicking on the "Past Updates" button in the selected goal      . RN: Ekin has blood sugars ranging in 300 to 500       Current Barriers:  [name] Knowledge Deficits related to Diabetes management and keeping blood sugars in a normal range . Film/video editor.  . Literacy barriers . Cognitive Deficits  Nurse Case Manager Clinical Goal(s):  [name] Over the next 90 days, patient will verbalize understanding of plan for making lifestyle changes that will impact his Diabetes diagnosis and decrease his blood glucose levels to optimal level . Over the next 90 days, patient will work with RNCM, pcp, and interdisciplinary team  to address needs related to Diabestes  . Over the next 90 days, patient will demonstrate a decrease in hyperglycemic episodes exacerbations as evidenced by blood sugars consistently <200 and following recommended guidelines established by pcp (per his spouse blood sugar levels have been 300-500) . Over the next 90 days, patient will attend all scheduled medical appointments: 04-27-2019 cardiologist, 04-29-2019 pcp . Over the next 90 days, patient will demonstrate improved adherence to prescribed treatment plan for diabetes as evidenced byimproved dietary habits related to ADA diet   Interventions:  . Evaluation of current treatment plan related to diabetes managagement  and patient's adherence to plan as established by provider. . Advised patient to cut back on sugary drinks and sodas and drink more water, education on flavored water options available  . Discussed  plans with patient for ongoing care management follow up and provided patient with direct contact information for care management team . Provided patient with ADA diet educational materials related to Compliance with ADA diet  . Advised patient, providing education and rationale, to check cbg BID and record, calling pcp for findings  outside established parameters.  Per the patients wife his blood sugars have been 300-500 at times  Patient Self Care Activities:  . Patient verbalizes understanding of plan to work toward managing diabetes more effectively by following a ADA diet and making healthier choices to manage his chronic health conditions . Calls provider office for new concerns or questions . Unable to independently follow recommendations and adhere to ADA diet and effectively manage diabetes care  Initial goal documentation        The patient verbalized understanding of instructions provided today and declined a print copy of patient instruction materials.   Telephone follow up appointment with care management team member scheduled for:06-19-2019 at 130pm  [place], MSN, [facility] Mobile: [phone]

## 2019-04-24 ENCOUNTER — Telehealth: Payer: Self-pay | Admitting: Nurse Practitioner

## 2019-04-24 NOTE — Telephone Encounter (Signed)
Called and provided orders.

## 2019-04-24 NOTE — Telephone Encounter (Signed)
[facility]es [facility] requesting verbal order to extent PT  1 wk 3  cb [phone]

## 2019-04-29 ENCOUNTER — Ambulatory Visit: Payer: Medicare Other | Admitting: Nurse Practitioner

## 2019-04-29 ENCOUNTER — Ambulatory Visit: Payer: Self-pay

## 2019-04-30 ENCOUNTER — Telehealth: Payer: Self-pay | Admitting: Nurse Practitioner

## 2019-04-30 NOTE — Telephone Encounter (Signed)
Pt is calling in to request a refill for [facility] ULTRA test strip and also Lancets    Pharmacy: [facility], [address] [place] [address]  Phone:  [phone] Fax:  [phone]

## 2019-04-30 NOTE — Telephone Encounter (Signed)
Form filled out and in Jolene's folder to sign.

## 2019-05-01 ENCOUNTER — Telehealth: Payer: Self-pay

## 2019-05-04 ENCOUNTER — Telehealth: Payer: Self-pay | Admitting: Pharmacist

## 2019-05-04 NOTE — Telephone Encounter (Signed)
Covering for [employer]. Will route this to Curt Bears to address; will route to [place] to address upon her return   Copied from [place] [phone]. Topic: General - Inquiry >> May 04, 2019 12:51 PM Virl Axe D wrote: Reason for CRM: Pt's wife would like a callback from [place] regarding paperwork for pt that was sent to them by Belenda Cruise. Paperwork is for pt to fill out and get help with medical bills.
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
## 2019-05-05 NOTE — Telephone Encounter (Signed)
Remailed to pt

## 2019-05-07 ENCOUNTER — Other Ambulatory Visit: Payer: Self-pay | Admitting: Nurse Practitioner

## 2019-05-07 NOTE — Telephone Encounter (Signed)
Requested medication (s) are due for refill today: yes  Requested medication (s) are on the active medication list: No  Last refill:  02/23/19  Future visit scheduled: yes  Notes to clinic: Not on Medication list.     Requested Prescriptions  Pending Prescriptions Disp Refills   meloxicam (MOBIC) 15 MG tablet [Pharmacy Med Name: MELOXICAM 15 MG TAB] 30 tablet     Sig: TAKE 1 TABLET BY MOUTH ONCE DAILY      Analgesics:  COX2 Inhibitors Passed - 05/07/2019 11:55 AM      Passed - HGB in normal range and within 360 days    Hemoglobin  Date Value Ref Range Status  03/25/2019 16.2 13.0 - 17.7 g/dL Final          Passed - Cr in normal range and within 360 days    Creatinine, Ser  Date Value Ref Range Status  03/25/2019 1.05 0.76 - 1.27 mg/dL Final   Creatinine, POC  Date Value Ref Range Status  08/22/2015 n/a mg/dL Final          Passed - Patient is not pregnant      Passed - Valid encounter within last 12 months    Recent Outpatient Visits           1 month ago Brainstem infarct, acute ([place])   [place], Jolene T, NP   2 months ago Controlled type 2 diabetes mellitus without complication, without long-term current use of insulin ([place])   [place] Kathrine Haddock, NP   5 months ago Vitamin D deficiency   [place], [place] T, NP   6 months ago Chronic pain of both knees   [place], Tow T, NP   7 months ago Controlled type 2 diabetes mellitus without complication, without long-term current use of insulin ([place])   [place], Barbaraann Faster, NP       Future Appointments             In 1 month Cannady, Barbaraann Faster, NP [employer], [facility]

## 2019-05-08 ENCOUNTER — Telehealth: Payer: Self-pay

## 2019-05-08 NOTE — Telephone Encounter (Signed)
Was only to be on for 2 weeks on review notes, not to continue.  No refills sent at this time.

## 2019-05-08 NOTE — Telephone Encounter (Signed)
Fax for Rx request   CLOPIDOGREL BISULFATE 75MG TAB #30 Sig; Take 1 Tab by mouth once daily.

## 2019-05-19 ENCOUNTER — Ambulatory Visit (INDEPENDENT_AMBULATORY_CARE_PROVIDER_SITE_OTHER): Payer: Medicare Other | Admitting: Pharmacist

## 2019-05-19 ENCOUNTER — Other Ambulatory Visit: Payer: Self-pay | Admitting: Nurse Practitioner

## 2019-05-19 DIAGNOSIS — E1165 Type 2 diabetes mellitus with hyperglycemia: Secondary | ICD-10-CM | POA: Diagnosis not present

## 2019-05-19 DIAGNOSIS — E700 Classical phenylketonuria: Secondary | ICD-10-CM

## 2019-05-19 DIAGNOSIS — I10 Essential (primary) hypertension: Secondary | ICD-10-CM

## 2019-05-19 DIAGNOSIS — E701 Other hyperphenylalaninemias: Secondary | ICD-10-CM

## 2019-05-19 MED ORDER — ONETOUCH ULTRA VI STRP: ORAL_STRIP | 3 refills | Status: DC

## 2019-05-19 MED ORDER — FENOFIBRATE 54 MG PO TABS: 54.0000 mg | ORAL_TABLET | Freq: Every day | ORAL | 3 refills | Status: DC

## 2019-05-19 NOTE — Chronic Care Management (AMB) (Signed)
Chronic Care Management   Follow Up Note   05/19/2019 Name: Khari Mally. MRN: [id number] DOB: [birth year]  Referred by: [name], NP Reason for referral : Chronic Care Management (Medication Management)   Delrae Alfred Dyrell Tuccillo. is a 59 y.o. year old male who is a primary care patient of Cannady, Barbaraann Faster, NP. The CCM team was consulted for assistance with chronic disease management and care coordination needs.    Contacted patient for medication management review. Spoke with his wife, [name].  Review of patient status, including review of consultants reports, relevant laboratory and other test results, and collaboration with appropriate care team members and the patient's provider was performed as part of comprehensive patient evaluation and provision of chronic care management services.    SDOH (Social Determinants of Health) assessments performed: Yes See Care Plan activities for detailed interventions related to SDOH)  SDOH Interventions     Most Recent Value  SDOH Interventions  SDOH Interventions for the Following Domains  Financial Strain, Housing  Financial Strain Interventions  Other (Comment) [Referral to [place] Interventions  Other (Comment) [Referral to Care Guide]       Outpatient Encounter Medications as of 05/19/2019  Medication Sig  . aspirin 81 MG EC tablet Take 1 tablet (81 mg total) by mouth daily.  [name] atorvastatin (LIPITOR) 80 MG tablet Take 1 tablet (80 mg total) by mouth daily.  . canagliflozin (INVOKANA) 100 MG TABS tablet Take 1 tablet (100 mg total) by mouth daily before breakfast.  . Cholecalciferol (VITAMIN D3) 1.25 MG (50000 UT) CAPS Take 50,000 Units by mouth once a week.  [name] Dextran 70-Hypromellose (ARTIFICIAL TEARS) 0.1-0.3 % SOLN Apply 1 drop to eye 3 (three) times daily. To right eye only.  [name] erythromycin ophthalmic ointment   . fenofibrate 54 MG tablet Take 1 tablet (54 mg total) by mouth daily.  [name] gabapentin (NEURONTIN)  300 MG capsule Take 1 capsule (300 mg total) by mouth 2 (two) times daily.  . meclizine (ANTIVERT) 12.5 MG tablet Take 1 tablet (12.5 mg total) by mouth 3 (three) times daily.  . meloxicam (MOBIC) 15 MG tablet TAKE 1 TABLET BY MOUTH ONCE DAILY  . montelukast (SINGULAIR) 10 MG tablet TAKE 1 TABLET BY MOUTH ONCE EVERY EVENING AT BEDTIME.  [name] neomycin-polymyxin b-dexamethasone (MAXITROL) 3.5-10000-0.1 SUSP   . omeprazole (PRILOSEC) 20 MG capsule Take 1 capsule (20 mg total) by mouth daily.  Glory Rosebush DELICA LANCETS FINE MISC TEST once daily  . ONETOUCH ULTRA test strip TEST ONCE DAILY  . acetaminophen (TYLENOL) 325 MG tablet Take 1-2 tablets (325-650 mg total) by mouth every 4 (four) hours as needed for mild pain.  [name] albuterol (PROAIR HFA) 108 (90 Base) MCG/ACT inhaler INHALE 2 PUFFS BY MOUTH EVERY 6 HOURS IF NEEDED FOR COUGH OR WHEEZING.  . clobetasol cream (TEMOVATE) 7.49 % Apply 1 application topically 2 (two) times daily. Apply to affected area  . fluticasone (FLOVENT HFA) 110 MCG/ACT inhaler Inhale 1 puff into the lungs 2 (two) times daily.  [name] loratadine (CLARITIN) 10 MG tablet Take 1 tablet (10 mg total) by mouth daily. (Patient not taking: Reported on 05/19/2019)  . nitroGLYCERIN (NITROSTAT) 0.4 MG SL tablet Place 1 tablet (0.4 mg total) under the tongue every 5 (five) minutes as needed for chest pain. (Patient not taking: Reported on 04/07/2019)  . ondansetron (ZOFRAN) 4 MG tablet Take 1 tablet (4 mg total) by mouth every 8 (eight) hours as needed for nausea or  vomiting. (Patient not taking: Reported on 05/19/2019)  . polyethylene glycol (MIRALAX) 17 g packet Take 17 g by mouth daily. (Patient not taking: Reported on 03/25/2019)   No facility-administered encounter medications on file as of 05/19/2019.     Objective:   Goals Addressed            This Visit's Progress     Patient Stated   . PharmD "I want to work on my diabetes" (pt-stated)       [place] (see longtitudinal plan of  care for additional care plan information)  Current Barriers:  . Diabetes, complicated by CKD, recent CVA (03/02/2019), PKU, learning disability; uncontrolled though improved, most recent A1c 8.6% o Today, [name] notes that she has questions about how to complete the paperwork sent to her by Rexene Alberts also notes that they recently have had a very high power bill, and they are unlikely to be able to pay this bill this month. She also notes that they had to get their hot water heater fixed, which was expensive. She notes that their door on the trailer does not hang straight, so they have heat leak constantly. They have blankets and plastic tarps hanging on the door and windows to prevent leak. They also haves some holes in the floor; [name] is very concerned that animals may get in  o Notes that patient has completed PT; still has an Therapist, sports coming out regularly . Current antihyperglycemic regimen: Invokana 100 mg daily o Metformin d/c d/t bump in Scr after restarted o Avoiding sulfonylurea d/t sulfa allergy  . Notes that [facility] RN had reported that his eye looked infected, so he saw Dr. Ellin Mayhew. Continuing tx w/ ABX eye drops . Current blood glucose readings: o [name] notes readings generally in 100-200s, but unable to find specific written readings for review today o Notes Aubery has cut back on sodas; they are buying more flavored water packets that are OK w/ his PKU . Cardiovascular risk reduction (follows w/ Dr. Humphrey Rolls, saw 04/27/19) o Per wife, pending ECHO, this is scheduled in April or may o Current hypertensive regimen: none; held lisinopril after hospitalization; however, patient has continued to take this medication as it was filled at the pharmacy  o Current hyperlipidemia regimen: atorvastatin 80 mg (just increased from 40 mg by PCP); fenofibrate 48 mg daily- needs refill on fenofibrate o Current antiplatelet regimen: ASA 81 mg daily; today, notes that [facility] RN insisted that patient was to continued  clopidogrel, so they received an emergency supply from the pharmacy . Chronic kidney disease: reports having to reschedule appt w/ Dr. Juleen China d/t [place] feeling sick; scheduled for 06/29/19 . Allergies/asthma: Flovent HFA daily, albuterol PRN wheezing/SOB; loratidine 10 mg daily and montelukast 10 mg daily  . Health maintenance: Vitamin D 50,000 IU weekly Q Monday  Pharmacist Clinical Goal(s):  [name] Over the next 90 days, patient with work with PharmD and primary care provider to address optimized medication management  Interventions: . Comprehensive medication review performed, medication list updated in electronic medical record.  . Reiterated to [name] that she was correct, [place] was to STOP clopidogrel s/p 3 weeks of DAPT. Reviewed bottles today, [name] is going to throw away clopidogrel. This was not refilled by PCP, so there is not an active order for this on file at the pharmacy . Called Dr. Laurelyn Sickle office re lisinopril. Unclear how long patient has been been taking, but a 30 day supply was dispensed 04/17/19. Per RN, Dr. Laurelyn Sickle note  from 04/27/19 did not specifically address restarting lisinopril. No BMP done. BP at visit that day was 118/68. Will collaborate w/ PCP to determine if she would like patient to get BMP s/p restarting lisinopril (though accidentally). If patient can tolerate BP lowering and renal function was not impacted, appropriate to continue lisinopril given elevated UACR >300 . Requests refill on fenofibrate. Will collaborate w/ PCP on this . [name] also notes that the last lancets dispensed by the pharmacy were not compatible w/ [employer]'s One Touch Ultra 2 meter. Will collaborate w/ PCP to send One Touch Ultra lancets. . Care guide referral for financial support.   Patient Self Care Activities:  . Patient will check blood glucose daily , document, and provide at future appointments . Patient will take medications as prescribed . Patient will report any questions or concerns to  provider   Please see past updates related to this goal by clicking on the "Past Updates" button in the selected goal          Plan:  - Scheduled f/u call 07/15/19  Catie Darnelle Maffucci, PharmD, [place] [phone]

## 2019-05-19 NOTE — Patient Instructions (Signed)
Visit Information  Goals Addressed            This Visit's Progress     Patient Stated   . PharmD "I want to work on my diabetes" (pt-stated)       [place] (see longtitudinal plan of care for additional care plan information)  Current Barriers:  . 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 She also notes that they had to get their hot water heater fixed, which was expensive. She notes that their door on the trailer does not hang straight, so they have heat leak constantly. They have blankets and plastic tarps hanging on the door and windows to prevent leak. They also haves some holes in the floor; [name] is very concerned that animals may get in  o Notes that patient has completed PT; still has an Therapist, sports coming out regularly . Current antihyperglycemic regimen: Invokana 100 mg daily o Metformin d/c d/t bump in Scr after restarted o Avoiding sulfonylurea d/t sulfa allergy  . Notes that [facility] RN had reported that his eye looked infected, so he saw Dr. Ellin Mayhew. Continuing tx w/ ABX eye drops . Current blood glucose readings: o [name] notes readings generally in 100-200s, but unable to find specific written readings for review today o Notes [name] has cut back on sodas; they are buying more flavored water packets that are OK w/ his PKU . Cardiovascular risk reduction (follows w/ Dr. Humphrey Rolls, saw 04/27/19) o Per wife, pending ECHO, this is scheduled in April or may o Current hypertensive regimen: none; held lisinopril after hospitalization; however, patient has continued to take this medication as it was filled at the pharmacy  o Current hyperlipidemia regimen: atorvastatin 80 mg  (just increased from 40 mg by PCP); fenofibrate 48 mg daily- needs refill on fenofibrate o Current antiplatelet regimen: ASA 81 mg daily; today, notes that [facility] RN insisted that patient was to continued clopidogrel, so they received an emergency supply from the pharmacy . Chronic kidney disease: reports having to reschedule appt w/ Dr. Juleen China d/t Izell  feeling sick; scheduled for 06/29/19 . Allergies/asthma: Flovent HFA daily, albuterol PRN wheezing/SOB; loratidine 10 mg daily and montelukast 10 mg daily  . Health maintenance: Vitamin D 50,000 IU weekly Q Monday  Pharmacist Clinical Goal(s):  [name] Over the next 90 days, patient with work with PharmD and primary care provider to address optimized medication management  Interventions: . Comprehensive medication review performed, medication list updated in electronic medical record.  . Reiterated to [name] that she was correct, [name] was to STOP clopidogrel s/p 3 weeks of DAPT. Reviewed bottles today, [name] is going to throw away clopidogrel. This was not refilled by PCP, so there is not an active order for this on file at the pharmacy . Called Dr. Laurelyn Sickle office re lisinopril. Unclear how long patient has been been taking, but a 30 day supply was dispensed 04/17/19. Per RN, Dr. Laurelyn Sickle note from 04/27/19 did not specifically address restarting lisinopril. No BMP done. BP at visit that day was 118/68. Will collaborate w/ PCP to determine if she would like patient to get BMP s/p restarting lisinopril (though accidentally). If patient can tolerate BP lowering  and renal function was not impacted, appropriate to continue lisinopril given elevated UACR >300 . Requests refill on fenofibrate. Will collaborate w/ PCP on this . [name] also notes that the last lancets dispensed by the pharmacy were not compatible w/ [name]'s One Touch Ultra 2 meter. Will collaborate w/ PCP to send One Touch Ultra lancets. . Care guide referral for financial support.   Patient Self Care  Activities:  . Patient will check blood glucose daily , document, and provide at future appointments . Patient will take medications as prescribed . Patient will report any questions or concerns to provider   Please see past updates related to this goal by clicking on the "Past Updates" button in the selected goal         Patient verbalizes understanding of instructions provided today.   Plan:  - Scheduled f/u call 07/15/19  Catie Darnelle Maffucci, PharmD, [place] [phone]

## 2019-05-21 ENCOUNTER — Telehealth: Payer: Self-pay | Admitting: Nurse Practitioner

## 2019-05-21 DIAGNOSIS — Z9181 History of falling: Secondary | ICD-10-CM | POA: Diagnosis not present

## 2019-05-21 DIAGNOSIS — Z7984 Long term (current) use of oral hypoglycemic drugs: Secondary | ICD-10-CM | POA: Diagnosis not present

## 2019-05-21 DIAGNOSIS — K589 Irritable bowel syndrome without diarrhea: Secondary | ICD-10-CM | POA: Diagnosis not present

## 2019-05-21 DIAGNOSIS — I69351 Hemiplegia and hemiparesis following cerebral infarction affecting right dominant side: Secondary | ICD-10-CM | POA: Diagnosis not present

## 2019-05-21 DIAGNOSIS — E1165 Type 2 diabetes mellitus with hyperglycemia: Secondary | ICD-10-CM | POA: Diagnosis not present

## 2019-05-21 DIAGNOSIS — K219 Gastro-esophageal reflux disease without esophagitis: Secondary | ICD-10-CM | POA: Diagnosis not present

## 2019-05-21 DIAGNOSIS — I10 Essential (primary) hypertension: Secondary | ICD-10-CM | POA: Diagnosis not present

## 2019-05-21 DIAGNOSIS — I69334 Monoplegia of upper limb following cerebral infarction affecting left non-dominant side: Secondary | ICD-10-CM | POA: Diagnosis not present

## 2019-05-21 DIAGNOSIS — J449 Chronic obstructive pulmonary disease, unspecified: Secondary | ICD-10-CM | POA: Diagnosis not present

## 2019-05-21 NOTE — Telephone Encounter (Signed)
05/21/2019  Name: [name].   MRN: [id number]   DOB: May 06, 1960   AGE: 59 y.o.   GENDER: male   PCP [name], NP.   Pt's wife called regarding Data processing manager Referral for application for [employer]. Reviewed application with her and she stated she will have her husband sign the Medicaid Application and drop if off at [facility] to make copies and we will mail to: [place]: Medicaid Enrollment [address] [place], [place] [address]  Will route to [employer] in [place] to make her aware.  See spouse encounter regarding referral for housing repair assistance, etc.   [place] . [email or web address]  [phone]

## 2019-05-21 NOTE — Telephone Encounter (Signed)
erounous

## 2019-05-21 NOTE — Telephone Encounter (Signed)
lvm to make apt per catie. See other encounter unable to document on other encounter note. Pt needs apt with PCP.

## 2019-05-25 NOTE — Telephone Encounter (Signed)
Pt dropped off both applications for Medicaid and [facility] care. Staff copied and mailed to the appropriate addresses. Closing referral pending any other needs of patient. [place] . [email or web address]  [phone]

## 2019-05-28 DIAGNOSIS — K589 Irritable bowel syndrome without diarrhea: Secondary | ICD-10-CM | POA: Diagnosis not present

## 2019-05-28 DIAGNOSIS — I69334 Monoplegia of upper limb following cerebral infarction affecting left non-dominant side: Secondary | ICD-10-CM | POA: Diagnosis not present

## 2019-05-28 DIAGNOSIS — I10 Essential (primary) hypertension: Secondary | ICD-10-CM | POA: Diagnosis not present

## 2019-05-28 DIAGNOSIS — J449 Chronic obstructive pulmonary disease, unspecified: Secondary | ICD-10-CM | POA: Diagnosis not present

## 2019-05-28 DIAGNOSIS — K219 Gastro-esophageal reflux disease without esophagitis: Secondary | ICD-10-CM | POA: Diagnosis not present

## 2019-05-28 DIAGNOSIS — I69351 Hemiplegia and hemiparesis following cerebral infarction affecting right dominant side: Secondary | ICD-10-CM | POA: Diagnosis not present

## 2019-05-28 DIAGNOSIS — Z9181 History of falling: Secondary | ICD-10-CM | POA: Diagnosis not present

## 2019-05-28 DIAGNOSIS — E1165 Type 2 diabetes mellitus with hyperglycemia: Secondary | ICD-10-CM | POA: Diagnosis not present

## 2019-05-28 DIAGNOSIS — Z7984 Long term (current) use of oral hypoglycemic drugs: Secondary | ICD-10-CM | POA: Diagnosis not present

## 2019-06-02 ENCOUNTER — Telehealth: Payer: Self-pay | Admitting: Nurse Practitioner

## 2019-06-02 NOTE — Chronic Care Management (AMB) (Signed)
  Care Management   Note  06/02/2019 Name: [name]. MRN: [id number] DOB: 10-20-1960  [name] is a 59 y.o. year old male who is a primary care patient of [name], NP and is actively engaged with the care management team. I reached out to [facility]. by phone today to assist with re-scheduling a follow up visit with the RN Case Manager  Follow up plan: Unsuccessful telephone outreach attempt made. A HIPPA compliant phone message was left for the patient providing contact information and requesting a return call.  The care management team will reach out to the patient again over the next 7 days.  If patient returns call to provider office, please advise to call [place]  at [place], [place], [place], [place] [address] Direct Dial: [phone] [email or web address] Website: [email or web address]

## 2019-06-02 NOTE — Chronic Care Management (AMB) (Signed)
  Care Management   Note  06/02/2019 Name: [name]. MRN: [id number] DOB: [birth year]  [name]. is a 59 y.o. year old male who is a primary care patient of [name], NP and is actively engaged with the care management team. I reached out to [facility] by phone today to assist with re-scheduling a follow up visit with the RN Case Manager  Follow up plan: Telephone appointment with care management team member scheduled for:07/10/2019  Noreene Larsson, [place], [place], [place] [address] Direct Dial: [phone] [email or web address] Website: [email or web address]

## 2019-06-04 DIAGNOSIS — I10 Essential (primary) hypertension: Secondary | ICD-10-CM | POA: Diagnosis not present

## 2019-06-04 DIAGNOSIS — K219 Gastro-esophageal reflux disease without esophagitis: Secondary | ICD-10-CM | POA: Diagnosis not present

## 2019-06-04 DIAGNOSIS — K589 Irritable bowel syndrome without diarrhea: Secondary | ICD-10-CM | POA: Diagnosis not present

## 2019-06-04 DIAGNOSIS — Z7984 Long term (current) use of oral hypoglycemic drugs: Secondary | ICD-10-CM | POA: Diagnosis not present

## 2019-06-04 DIAGNOSIS — I69334 Monoplegia of upper limb following cerebral infarction affecting left non-dominant side: Secondary | ICD-10-CM | POA: Diagnosis not present

## 2019-06-04 DIAGNOSIS — Z9181 History of falling: Secondary | ICD-10-CM | POA: Diagnosis not present

## 2019-06-04 DIAGNOSIS — J449 Chronic obstructive pulmonary disease, unspecified: Secondary | ICD-10-CM | POA: Diagnosis not present

## 2019-06-04 DIAGNOSIS — E1165 Type 2 diabetes mellitus with hyperglycemia: Secondary | ICD-10-CM | POA: Diagnosis not present

## 2019-06-04 DIAGNOSIS — I69351 Hemiplegia and hemiparesis following cerebral infarction affecting right dominant side: Secondary | ICD-10-CM | POA: Diagnosis not present

## 2019-06-11 DIAGNOSIS — I69334 Monoplegia of upper limb following cerebral infarction affecting left non-dominant side: Secondary | ICD-10-CM | POA: Diagnosis not present

## 2019-06-11 DIAGNOSIS — K219 Gastro-esophageal reflux disease without esophagitis: Secondary | ICD-10-CM | POA: Diagnosis not present

## 2019-06-11 DIAGNOSIS — I69351 Hemiplegia and hemiparesis following cerebral infarction affecting right dominant side: Secondary | ICD-10-CM | POA: Diagnosis not present

## 2019-06-11 DIAGNOSIS — Z7984 Long term (current) use of oral hypoglycemic drugs: Secondary | ICD-10-CM | POA: Diagnosis not present

## 2019-06-11 DIAGNOSIS — E1165 Type 2 diabetes mellitus with hyperglycemia: Secondary | ICD-10-CM | POA: Diagnosis not present

## 2019-06-11 DIAGNOSIS — I10 Essential (primary) hypertension: Secondary | ICD-10-CM | POA: Diagnosis not present

## 2019-06-11 DIAGNOSIS — Z9181 History of falling: Secondary | ICD-10-CM | POA: Diagnosis not present

## 2019-06-11 DIAGNOSIS — J449 Chronic obstructive pulmonary disease, unspecified: Secondary | ICD-10-CM | POA: Diagnosis not present

## 2019-06-11 DIAGNOSIS — K589 Irritable bowel syndrome without diarrhea: Secondary | ICD-10-CM | POA: Diagnosis not present

## 2019-06-15 ENCOUNTER — Telehealth: Payer: Self-pay

## 2019-06-18 DIAGNOSIS — I10 Essential (primary) hypertension: Secondary | ICD-10-CM | POA: Diagnosis not present

## 2019-06-18 DIAGNOSIS — J449 Chronic obstructive pulmonary disease, unspecified: Secondary | ICD-10-CM | POA: Diagnosis not present

## 2019-06-18 DIAGNOSIS — E1165 Type 2 diabetes mellitus with hyperglycemia: Secondary | ICD-10-CM | POA: Diagnosis not present

## 2019-06-18 DIAGNOSIS — K219 Gastro-esophageal reflux disease without esophagitis: Secondary | ICD-10-CM | POA: Diagnosis not present

## 2019-06-18 DIAGNOSIS — Z9181 History of falling: Secondary | ICD-10-CM | POA: Diagnosis not present

## 2019-06-18 DIAGNOSIS — Z7984 Long term (current) use of oral hypoglycemic drugs: Secondary | ICD-10-CM | POA: Diagnosis not present

## 2019-06-18 DIAGNOSIS — K589 Irritable bowel syndrome without diarrhea: Secondary | ICD-10-CM | POA: Diagnosis not present

## 2019-06-18 DIAGNOSIS — I69351 Hemiplegia and hemiparesis following cerebral infarction affecting right dominant side: Secondary | ICD-10-CM | POA: Diagnosis not present

## 2019-06-18 DIAGNOSIS — I69334 Monoplegia of upper limb following cerebral infarction affecting left non-dominant side: Secondary | ICD-10-CM | POA: Diagnosis not present

## 2019-06-19 ENCOUNTER — Telehealth: Payer: Self-pay

## 2019-06-22 ENCOUNTER — Telehealth: Payer: Self-pay | Admitting: Nurse Practitioner

## 2019-06-22 DIAGNOSIS — H538 Other visual disturbances: Secondary | ICD-10-CM | POA: Diagnosis not present

## 2019-06-22 DIAGNOSIS — H16049 Marginal corneal ulcer, unspecified eye: Secondary | ICD-10-CM | POA: Diagnosis not present

## 2019-06-22 NOTE — Telephone Encounter (Signed)
Called pt to r/s awv, no answer, left vm    Copied from [place] [phone]. Topic: Appointment Scheduling - Scheduling Inquiry for Clinic >> Jun 22, 2019  3:06 PM Rainey Pines A wrote: Patient wants to reschedule AWV TO 5/12 due to vehicle being in the shop. Patient wants to know if there is anyway to get awv done same day as app on 5/12

## 2019-06-23 ENCOUNTER — Other Ambulatory Visit: Payer: Self-pay | Admitting: Nurse Practitioner

## 2019-06-23 ENCOUNTER — Telehealth: Payer: Self-pay | Admitting: Nurse Practitioner

## 2019-06-23 MED ORDER — EMPAGLIFLOZIN 25 MG PO TABS: 25.0000 mg | ORAL_TABLET | Freq: Every day | ORAL | 5 refills | Status: DC

## 2019-06-23 NOTE — Telephone Encounter (Signed)
Please let patient know I have sent in [place].  Will see him in May to recheck that A1C!!  Have a great day!!

## 2019-06-23 NOTE — Telephone Encounter (Signed)
Pt called about medication change that was requested by the pharmacy this morning

## 2019-06-23 NOTE — Telephone Encounter (Signed)
[facility] is calling and pt insurance will not pay for invokana however they will pay for jardiance or farxiga. Pt will needs new rx

## 2019-06-23 NOTE — Telephone Encounter (Signed)
Routing to provider to advise.

## 2019-06-23 NOTE — Telephone Encounter (Signed)
Called and LVM asking for patient to please return my call.

## 2019-06-24 ENCOUNTER — Ambulatory Visit: Payer: Medicare Other | Admitting: Nurse Practitioner

## 2019-06-24 ENCOUNTER — Ambulatory Visit: Payer: Medicare Other

## 2019-06-24 NOTE — Telephone Encounter (Signed)
Called and LVM for patient asking for him to please return my call.

## 2019-06-25 NOTE — Telephone Encounter (Signed)
Called and LVM for patient asking for him to please return my call. 3 attempts have been made to reach this patient.

## 2019-06-25 NOTE — Telephone Encounter (Signed)
Letter generated and sent to patient.

## 2019-06-25 NOTE — Telephone Encounter (Signed)
Noted, thank you.  I suspect he has picked up, but we can send letter notifying him of this change being made.  Thank you!!

## 2019-06-26 ENCOUNTER — Telehealth: Payer: Self-pay | Admitting: Nurse Practitioner

## 2019-06-26 NOTE — Telephone Encounter (Signed)
Copied from [place] [phone]. Topic: General - Other >> Jun 26, 2019  7:50 AM Rayann Heman wrote: Reason for CRM: tammy called and would like a call from Mrs Owens Shark regarding getting medicaid. Please advise. [name] states that they have been getting bills in the mail. Please advise

## 2019-07-03 ENCOUNTER — Telehealth: Payer: Self-pay | Admitting: Nurse Practitioner

## 2019-07-03 NOTE — Telephone Encounter (Signed)
Pt has an appt 05/12.

## 2019-07-03 NOTE — Telephone Encounter (Signed)
FYI for [employer] too, I am assuming [facility] was out and did A1C at a visit.  He is scheduled 07/29/19 and we will recheck in office.  Was reported at 10.7 today.

## 2019-07-03 NOTE — Telephone Encounter (Signed)
Copied from [place] [phone]. Topic: General - Other >> Jul 03, 2019  3:59 PM Keene Breath wrote: Reason for CRM: Called to leave a message for the doctor with the patient's A1C which was 10.7 today.  Patient should follow up with the doctor.  CB# [phone]

## 2019-07-07 NOTE — Telephone Encounter (Signed)
  Community Resource Referral   [place] 07/07/2019  Name: [name].   MRN: [id number]   DOB: [birth year]   AGE: 59 y.o.   GENDER: male   PCP [name], NP.   Called pt regarding Data processing manager Referral for [employer]. Ms. Kadrmas is going to bring the medical bills with her to their appt on 5/12 at [place] . [email or web address]  [phone]

## 2019-07-10 ENCOUNTER — Telehealth: Payer: Self-pay | Admitting: General Practice

## 2019-07-10 ENCOUNTER — Ambulatory Visit (INDEPENDENT_AMBULATORY_CARE_PROVIDER_SITE_OTHER): Payer: Medicare Other | Admitting: General Practice

## 2019-07-10 DIAGNOSIS — I152 Hypertension secondary to endocrine disorders: Secondary | ICD-10-CM

## 2019-07-10 DIAGNOSIS — E1159 Type 2 diabetes mellitus with other circulatory complications: Secondary | ICD-10-CM

## 2019-07-10 DIAGNOSIS — I6389 Other cerebral infarction: Secondary | ICD-10-CM

## 2019-07-10 DIAGNOSIS — E1165 Type 2 diabetes mellitus with hyperglycemia: Secondary | ICD-10-CM

## 2019-07-10 DIAGNOSIS — E700 Classical phenylketonuria: Secondary | ICD-10-CM

## 2019-07-10 DIAGNOSIS — E701 Other hyperphenylalaninemias: Secondary | ICD-10-CM

## 2019-07-10 NOTE — Patient Instructions (Signed)
Visit Information  Goals Addressed            This Visit's Progress   . COMPLETED: RN-Amaris is still drinking a lot of sodas and eating a lot of meat (pt-stated)       Current Barriers:  [name] Kitchen Knowledge Deficits related to diabetic diet, post CVA ,HTN diet, and PKU diet  . Transportation Barriers- currently can not drive due to CVA 16/01; his father takes him to  his appointments and other places to get food . Film/video editor.  . Literacy barriers . Cognitive Deficits . Unable to travel to specialists outside of [facility] related to law enforced electronic monitoring.   Nurse Case Manager Clinical Goal(s):  [name] Kitchen Over the next 90 days, patient will work with [facility]  to address needs related to learning what patient can eat on Diabetic, Hearth healthy, and PKU diet   Interventions:  . Evaluation of current treatment plan related to DM, HTN, Post CVA and PKU and patient's adherence to plan as established by provider. . Provided education to patient re: related to flavored water options to try due to limitations and not being able to drink diet drinks due to PKU. The patient likes to drink Mt. Dew and Pepsi and sometimes drinks more than he should . Discussed plans with patient for ongoing care management follow up and provided patient with direct contact information for care management team . Advised patient, providing education and rationale, to monitor blood pressure daily and record, calling pcp for findings outside established parameters.  . Provided patient with ADA/Heart Healthy educational materials related to compliance concerns with following a ADA/Heart Healthy Diet . Advised patient, providing education and rationale, to check cbg BID or more frequently as needed and record, calling pcp for findings outside established parameters.    Patient Self Care Activities:  . Patient needs continued support in his efforts towards adherence to general PKU guidelines within the  recommendations for a diabetic diet and heart Healthy options   Please see past updates related to this goal by clicking on the "Past Updates" button in the selected goal      . RN: [name] has blood sugars ranging in 300 to 500       Current Barriers:  [name] Kitchen Knowledge Deficits related to Diabetes management and keeping blood sugars in a normal range as evidence of hemoglobin A1C o 10.7 at the housecalls visit from [facility] . Film/video editor.  . Literacy barriers . Cognitive Deficits  Nurse Case Manager Clinical Goal(s):  [name] Kitchen Over the next 120 days, patient will verbalize understanding of plan for making lifestyle changes that will impact his Diabetes diagnosis and decrease his blood glucose levels to optimal level . Over the next 120 days, patient will work with RNCM, pcp, and interdisciplinary team  to address needs related to Diabestes  . Over the next 120 days, patient will demonstrate a decrease in hyperglycemic episodes exacerbations as evidenced by blood sugars consistently <200 and following recommended guidelines established by pcp (per his spouse blood sugar levels have been mostly in 200's but has had  300-500 readings ) . Over the next 120 days, patient will attend all scheduled medical appointments: pcp on 07-29-2019 . Over the next 120 days, patient will demonstrate improved adherence to prescribed treatment plan for diabetes as evidenced byimproved dietary habits related to ADA diet   Interventions:  . Evaluation of current treatment plan related to diabetes managagement  and patient's adherence to plan as established by provider. [name] Kitchen  Advised patient to cut back on sugary drinks and sodas and drink more water, education on flavored water options available.  Per the patients wife he was doing well with drinking flavored water but has gone back to drinking Dr. Malachi Bonds or Pepsi . Discussed plans with patient for ongoing care management follow up and provided patient with direct contact  information for care management team . Provided patient with ADA diet educational materials related to Compliance with ADA diet.  Education on watching his diet and adhering to a heart Healthy/ADA diet. The patients wife verbalized that the patient eats a lot of bread. Information given to the patients wife on foods that the patient should avoid or eat in moderation.  . Advised patient, providing education and rationale, to check cbg BID and record, calling pcp for findings outside established parameters.  Per the patients wife his blood sugars have been 300-500 at times . Education on how diabetes can impact other organ systems like the kidneys, eyes and have a negative impact on the patients over all health. Expressed the importance of lifestyle changes to have positive impacts on the patients health.   Patient Self Care Activities:  . Patient verbalizes understanding of plan to work toward managing diabetes more effectively by following a ADA diet and making healthier choices to manage his chronic health conditions . Calls provider office for new concerns or questions . Unable to independently follow recommendations and adhere to ADA diet and effectively manage diabetes care  Please see past updates related to this goal by clicking on the "Past Updates" button in the selected goal      . RNCM: pt's wife-"[name] has not been taking his blood pressure, he needs new batteries" (pt-stated)       CARE PLAN ENTRY (see longtitudinal plan of care for additional care plan information)  Current Barriers:  . 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 Work with the care management team to address educational, disease management, and care coordination needs  . Begin or continue self health monitoring activities as directed today Measure  and record blood pressure 3 times per week and adhering to a heart heatlhy/ADA/PKU free diet . Call provider office for new or worsened signs and symptoms Blood glucose findings outside established parameters, Blood pressure findings outside established parameters, Oxygen saturation lower than established parameter, Shortness of breath, and New or worsened symptom related to chronic conditions . Call care management team with questions or concerns . Verbalize basic understanding of patient centered plan of care established today  Interventions related to HTN ,PKU and history of CVA:  . Evaluation of current treatment plans and patient's adherence to plan as established by provider . Assessed patient understanding of disease states . Assessed patient's education and care coordination needs . Provided disease specific education to patient.  The patient needs constant reminders to follow restricted diet of low sodium, low sugar content and food/beverage without PKU . Collaborated with appropriate clinical care team members regarding patient needs  Patient Self Care Activities related to HTN ,PKU and history of CVA . Patient is unable to independently self-manage chronic health conditions  Initial goal documentation        Patient verbalizes understanding of instructions provided today.   Telephone follow up appointment with care management team member scheduled for:09-09-2019 at 230  pm  Noreene Larsson RN, MSN, [facility] Mobile: [phone]

## 2019-07-10 NOTE — Chronic Care Management (AMB) (Signed)
Chronic Care Management   Follow Up Note   07/10/2019 Name: [name]. MRN: [id number] DOB: February 24, 1961  Referred by: [name], NP Reason for referral : Chronic Care Management (Follow up call: HTN/Uncontrolled DM and other chronic conditions)   [name]. is a 59 y.o. year old male who is a primary care patient of Cannady, Barbaraann Faster, NP. The CCM team was consulted for assistance with chronic disease management and care coordination needs.    Review of patient status, including review of consultants reports, relevant laboratory and other test results, and collaboration with appropriate care team members and the patient's provider was performed as part of comprehensive patient evaluation and provision of chronic care management services.    SDOH (Social Determinants of Health) assessments performed: No See Care Plan activities for detailed interventions related to [facility])     Outpatient Encounter Medications as of 07/10/2019  Medication Sig  . acetaminophen (TYLENOL) 325 MG tablet Take 1-2 tablets (325-650 mg total) by mouth every 4 (four) hours as needed for mild pain.  [name] albuterol (PROAIR HFA) 108 (90 Base) MCG/ACT inhaler INHALE 2 PUFFS BY MOUTH EVERY 6 HOURS IF NEEDED FOR COUGH OR WHEEZING.  [name] aspirin 81 MG EC tablet Take 1 tablet (81 mg total) by mouth daily.  [name] atorvastatin (LIPITOR) 80 MG tablet Take 1 tablet (80 mg total) by mouth daily.  . Cholecalciferol (VITAMIN D3) 1.25 MG (50000 UT) CAPS Take 50,000 Units by mouth once a week.  . clobetasol cream (TEMOVATE) 6.26 % Apply 1 application topically 2 (two) times daily. Apply to affected area  . Dextran 70-Hypromellose (ARTIFICIAL TEARS) 0.1-0.3 % SOLN Apply 1 drop to eye 3 (three) times daily. To right eye only.  . empagliflozin (JARDIANCE) 25 MG TABS tablet Take 25 mg by mouth daily.  [name] erythromycin ophthalmic ointment   . fenofibrate 54 MG tablet Take 1 tablet (54 mg total) by mouth daily.  .  fluticasone (FLOVENT HFA) 110 MCG/ACT inhaler Inhale 1 puff into the lungs 2 (two) times daily.  [name] gabapentin (NEURONTIN) 300 MG capsule Take 1 capsule (300 mg total) by mouth 2 (two) times daily.  [name] glucose blood (ONETOUCH ULTRA) test strip To check blood sugar three times daily  . lisinopril (ZESTRIL) 20 MG tablet Take 20 mg by mouth daily.  [name] loratadine (CLARITIN) 10 MG tablet Take 1 tablet (10 mg total) by mouth daily. (Patient not taking: Reported on 05/19/2019)  . meclizine (ANTIVERT) 12.5 MG tablet Take 1 tablet (12.5 mg total) by mouth 3 (three) times daily.  . meloxicam (MOBIC) 15 MG tablet TAKE 1 TABLET BY MOUTH ONCE DAILY  . montelukast (SINGULAIR) 10 MG tablet TAKE 1 TABLET BY MOUTH ONCE EVERY EVENING AT BEDTIME.  [name] neomycin-polymyxin b-dexamethasone (MAXITROL) 3.5-10000-0.1 SUSP   . nitroGLYCERIN (NITROSTAT) 0.4 MG SL tablet Place 1 tablet (0.4 mg total) under the tongue every 5 (five) minutes as needed for chest pain. (Patient not taking: Reported on 04/07/2019)  . omeprazole (PRILOSEC) 20 MG capsule Take 1 capsule (20 mg total) by mouth daily.  . ondansetron (ZOFRAN) 4 MG tablet Take 1 tablet (4 mg total) by mouth every 8 (eight) hours as needed for nausea or vomiting. (Patient not taking: Reported on 05/19/2019)  . polyethylene glycol (MIRALAX) 17 g packet Take 17 g by mouth daily. (Patient not taking: Reported on 03/25/2019)   No facility-administered encounter medications on file as of 07/10/2019.     Objective:   Goals Addressed  This Visit's Progress   . COMPLETED: RN-[name] is still drinking a lot of sodas and eating a lot of meat (pt-stated)       Current Barriers:  [name] Knowledge Deficits related to diabetic diet, post CVA ,HTN diet, and PKU diet  . Transportation Barriers- currently can not drive due to CVA 78/24; his father takes him to  his appointments and other places to get food . Film/video editor.  . Literacy barriers . Cognitive Deficits . Unable to  travel to specialists outside of [facility] related to law enforced electronic monitoring.   Nurse Case Manager Clinical Goal(s):  [name] Over the next 90 days, patient will work with [facility]  to address needs related to learning what patient can eat on Diabetic, Hearth healthy, and PKU diet   Interventions:  . Evaluation of current treatment plan related to DM, HTN, Post CVA and PKU and patient's adherence to plan as established by provider. . Provided education to patient re: related to flavored water options to try due to limitations and not being able to drink diet drinks due to PKU. The patient likes to drink Mt. Dew and Pepsi and sometimes drinks more than he should . Discussed plans with patient for ongoing care management follow up and provided patient with direct contact information for care management team . Advised patient, providing education and rationale, to monitor blood pressure daily and record, calling pcp for findings outside established parameters.  . Provided patient with ADA/Heart Healthy educational materials related to compliance concerns with following a ADA/Heart Healthy Diet . Advised patient, providing education and rationale, to check cbg BID or more frequently as needed and record, calling pcp for findings outside established parameters.    Patient Self Care Activities:  . Patient needs continued support in his efforts towards adherence to general PKU guidelines within the recommendations for a diabetic diet and heart Healthy options   Please see past updates related to this goal by clicking on the "Past Updates" button in the selected goal      . RN: [name] has blood sugars ranging in 300 to 500       Current Barriers:  [name] Knowledge Deficits related to Diabetes management and keeping blood sugars in a normal range as evidence of hemoglobin A1C o 10.7 at the housecalls visit from [facility] . Film/video editor.  . Literacy barriers . Cognitive Deficits  Nurse Case  Manager Clinical Goal(s):  [name] Over the next 120 days, patient will verbalize understanding of plan for making lifestyle changes that will impact his Diabetes diagnosis and decrease his blood glucose levels to optimal level . Over the next 120 days, patient will work with RNCM, pcp, and interdisciplinary team  to address needs related to Diabestes  . Over the next 120 days, patient will demonstrate a decrease in hyperglycemic episodes exacerbations as evidenced by blood sugars consistently <200 and following recommended guidelines established by pcp (per his spouse blood sugar levels have been mostly in 200's but has had  300-500 readings ) . Over the next 120 days, patient will attend all scheduled medical appointments: pcp on 07-29-2019 . Over the next 120 days, patient will demonstrate improved adherence to prescribed treatment plan for diabetes as evidenced byimproved dietary habits related to ADA diet   Interventions:  . Evaluation of current treatment plan related to diabetes managagement  and patient's adherence to plan as established by provider. . Advised patient to cut back on sugary drinks and sodas and drink more water, education  on flavored water options available.  Per the patients wife he was doing well with drinking flavored water but has gone back to drinking Dr. Malachi Bonds or Pepsi . Discussed plans with patient for ongoing care management follow up and provided patient with direct contact information for care management team . Provided patient with ADA diet educational materials related to Compliance with ADA diet.  Education on watching his diet and adhering to a heart Healthy/ADA diet. The patients wife verbalized that the patient eats a lot of bread. Information given to the patients wife on foods that the patient should avoid or eat in moderation.  . Advised patient, providing education and rationale, to check cbg BID and record, calling pcp for findings outside established parameters.   Per the patients wife his blood sugars have been 300-500 at times . Education on how diabetes can impact other organ systems like the kidneys, eyes and have a negative impact on the patients over all health. Expressed the importance of lifestyle changes to have positive impacts on the patients health.   Patient Self Care Activities:  . Patient verbalizes understanding of plan to work toward managing diabetes more effectively by following a ADA diet and making healthier choices to manage his chronic health conditions . Calls provider office for new concerns or questions . Unable to independently follow recommendations and adhere to ADA diet and effectively manage diabetes care  Please see past updates related to this goal by clicking on the "Past Updates" button in the selected goal      . RNCM: pt's wife-"[name] has not been taking his blood pressure, he needs new batteries" (pt-stated)       CARE PLAN ENTRY (see longtitudinal plan of care for additional care plan information)  Current Barriers:  . 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 Work with the care management team to address educational, disease management, and care coordination needs  . Begin or continue self health monitoring activities as directed today Measure and record blood pressure 3 times per week and adhering to a heart heatlhy/ADA/PKU free diet . Call provider office for new or worsened signs and symptoms Blood glucose findings outside established parameters, Blood pressure findings outside established parameters, Oxygen saturation lower than established parameter, Shortness of breath, and New or worsened symptom related to chronic conditions . Call care management team with questions or concerns . Verbalize basic understanding of patient centered plan  of care established today  Interventions related to HTN ,PKU and history of CVA:  . Evaluation of current treatment plans and patient's adherence to plan as established by provider . Assessed patient understanding of disease states . Assessed patient's education and care coordination needs . Provided disease specific education to patient.  The patient needs constant reminders to follow restricted diet of low sodium, low sugar content and food/beverage without PKU . Collaborated with appropriate clinical care team members regarding patient needs  Patient Self Care Activities related to HTN ,PKU and history of CVA . Patient is unable to independently self-manage chronic health conditions  Initial goal documentation         Plan:   Telephone follow up appointment with care management team member scheduled for: 09-09-2019 at 230pm    [place], MSN, [facility] Mobile: [phone]

## 2019-07-15 ENCOUNTER — Ambulatory Visit: Payer: Self-pay | Admitting: Pharmacist

## 2019-07-15 DIAGNOSIS — E1159 Type 2 diabetes mellitus with other circulatory complications: Secondary | ICD-10-CM | POA: Diagnosis not present

## 2019-07-15 DIAGNOSIS — E1165 Type 2 diabetes mellitus with hyperglycemia: Secondary | ICD-10-CM

## 2019-07-15 DIAGNOSIS — E1169 Type 2 diabetes mellitus with other specified complication: Secondary | ICD-10-CM

## 2019-07-15 DIAGNOSIS — I10 Essential (primary) hypertension: Secondary | ICD-10-CM

## 2019-07-15 DIAGNOSIS — E785 Hyperlipidemia, unspecified: Secondary | ICD-10-CM

## 2019-07-15 DIAGNOSIS — I6389 Other cerebral infarction: Secondary | ICD-10-CM | POA: Diagnosis not present

## 2019-07-15 NOTE — Chronic Care Management (AMB) (Signed)
Chronic Care Management   Follow Up Note   07/15/2019 Name: [name]. MRN: [id number] DOB: 09-Mar-1961  Referred by: [name], NP Reason for referral : Chronic Care Management (Medication Management)   [name]. is a 59 y.o. year old male who is a primary care patient of Cannady, Barbaraann Faster, NP. The CCM team was consulted for assistance with chronic disease management and care coordination needs.    Contacted patient's wife for medication management review.   Review of patient status, including review of consultants reports, relevant laboratory and other test results, and collaboration with appropriate care team members and the patient's provider was performed as part of comprehensive patient evaluation and provision of chronic care management services.    SDOH (Social Determinants of Health) assessments performed: No See Care Plan activities for detailed interventions related to [facility])     Outpatient Encounter Medications as of 07/15/2019  Medication Sig  . albuterol (PROAIR HFA) 108 (90 Base) MCG/ACT inhaler INHALE 2 PUFFS BY MOUTH EVERY 6 HOURS IF NEEDED FOR COUGH OR WHEEZING.  [name] aspirin 81 MG EC tablet Take 1 tablet (81 mg total) by mouth daily.  [name] atorvastatin (LIPITOR) 80 MG tablet Take 1 tablet (80 mg total) by mouth daily.  . Cholecalciferol (VITAMIN D3) 1.25 MG (50000 UT) CAPS Take 50,000 Units by mouth once a week.  [name] Dextran 70-Hypromellose (ARTIFICIAL TEARS) 0.1-0.3 % SOLN Apply 1 drop to eye 3 (three) times daily. To right eye only.  . empagliflozin (JARDIANCE) 25 MG TABS tablet Take 25 mg by mouth daily.  [name] erythromycin ophthalmic ointment   . fenofibrate 54 MG tablet Take 1 tablet (54 mg total) by mouth daily.  [name] gabapentin (NEURONTIN) 300 MG capsule Take 1 capsule (300 mg total) by mouth 2 (two) times daily.  [name] glucose blood (ONETOUCH ULTRA) test strip To check blood sugar three times daily  . lisinopril (ZESTRIL) 20 MG tablet Take 20 mg  by mouth daily.  . montelukast (SINGULAIR) 10 MG tablet TAKE 1 TABLET BY MOUTH ONCE EVERY EVENING AT BEDTIME.  [name] neomycin-polymyxin b-dexamethasone (MAXITROL) 3.5-10000-0.1 SUSP   . ofloxacin (FLOXIN) 0.3 % OTIC solution 5 drops daily.  [name] omeprazole (PRILOSEC) 20 MG capsule Take 1 capsule (20 mg total) by mouth daily.  . polyethylene glycol (MIRALAX) 17 g packet Take 17 g by mouth daily.  [name] trimethoprim-polymyxin b (POLYTRIM) ophthalmic solution   . acetaminophen (TYLENOL) 325 MG tablet Take 1-2 tablets (325-650 mg total) by mouth every 4 (four) hours as needed for mild pain.  . clobetasol cream (TEMOVATE) 7.62 % Apply 1 application topically 2 (two) times daily. Apply to affected area  . fluticasone (FLOVENT HFA) 110 MCG/ACT inhaler Inhale 1 puff into the lungs 2 (two) times daily. (Patient not taking: Reported on 07/15/2019)  . loratadine (CLARITIN) 10 MG tablet Take 1 tablet (10 mg total) by mouth daily. (Patient not taking: Reported on 05/19/2019)  . meclizine (ANTIVERT) 12.5 MG tablet Take 1 tablet (12.5 mg total) by mouth 3 (three) times daily.  . meloxicam (MOBIC) 15 MG tablet TAKE 1 TABLET BY MOUTH ONCE DAILY (Patient not taking: Reported on 07/15/2019)  . nitroGLYCERIN (NITROSTAT) 0.4 MG SL tablet Place 1 tablet (0.4 mg total) under the tongue every 5 (five) minutes as needed for chest pain. (Patient not taking: Reported on 04/07/2019)  . ondansetron (ZOFRAN) 4 MG tablet Take 1 tablet (4 mg total) by mouth every 8 (eight) hours as needed for nausea or vomiting. (  Patient not taking: Reported on 05/19/2019)   No facility-administered encounter medications on file as of 07/15/2019.     Objective:   Goals Addressed            This Visit's Progress     Patient Stated   . PharmD "I want to work on my diabetes" (pt-stated)       [place] (see longtitudinal plan of care for additional care plan information)  Current Barriers:  . 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 Current antihyperglycemic regimen: Jardiance 25 mg daily  o Metformin d/c d/t bump in Scr after restarted o Avoiding sulfonylurea d/t sulfa allergy  . Current blood glucose readings: o Today: 300+ fasting; most often in 200s, occasional 100s o Endorses polyuria, polydipsia -> though patient is drinking regular soda. Wife will purchase flavored water w/o phenylalanine, but patient will purchase his own drinks when he is out . Cardiovascular risk reduction (follows w/ Dr. Humphrey Rolls, reports appt w/ ECHO scheduled 08/03/19) o ECHO scheduled 5/17  o Current hypertensive regimen: lisinopril 10 mg daily  o Current hyperlipidemia regimen: atorvastatin 80 mg; fenofibrate 48 mg daily o Current antiplatelet regimen: ASA 81 mg daily;  . Chronic kidney disease: appt w/ Kolluru scheduled 07/24/19 . S/p CVA: appt w/ Potter scheduled 07/27/19 . Allergies/asthma: albuterol PRN wheezing/SOB; loratidine 10 mg daily and montelukast 10 mg daily  . Health maintenance: Vitamin D 50,000 IU weekly Q Monday  Pharmacist Clinical Goal(s):  [name] Over the next 90 days, patient with work with PharmD and primary care provider to address optimized medication management  Interventions: . Comprehensive medication review performed, medication list updated in electronic medical record . Inter-disciplinary care team collaboration (see longitudinal plan of care) . Given elevated sugars and difficult to resolve infection, recommend sooner PCP f/u. Scheduled patient for appt here in clinic in 2 days. Discussed potential of adding injectable medications w/ [name]- she notes that she would be able to administer the medication for the patient if shown how. Could consider GLP1 vs basal insulin. Would prefer GLP1 to reduce  risk of hypoglycemia, though insulin would have faster onset of action in regard to controlling sugars to help clear infection. Could do basal insulin for a period of time, attain sugar control, and switch to GLP1. Will collaborate w/ PCP on this  Patient Self Care Activities:  . Patient will check blood glucose daily , document, and provide at future appointments . Patient will take medications as prescribed . Patient will report any questions or concerns to provider   Please see past updates related to this goal by clicking on the "Past Updates" button in the selected goal          Plan:  - Scheduled f/u call 08/14/19  Catie Darnelle Maffucci, PharmD, [place] [phone]

## 2019-07-15 NOTE — Patient Instructions (Signed)
Visit Information  Goals Addressed            This Visit's Progress     Patient Stated   . PharmD "I want to work on my diabetes" (pt-stated)       [place] (see longtitudinal plan of care for additional care plan information)  Current Barriers:  . Diabetes, complicated by CKD, recent CVA (03/02/2019), PKU, learning disability; uncontrolled though improved, most recent A1c 8.6%- reports [employer] [facility] reported A1c 10% o Reports that he has an eye infection that is not resolving - has "been to the eye doctor 4 times this month" . Current antihyperglycemic regimen: Jardiance 25 mg daily  o Metformin d/c d/t bump in Scr after restarted o Avoiding sulfonylurea d/t sulfa allergy  . Current blood glucose readings: o Today: 300+ fasting; most often in 200s, occasional 100s o Endorses polyuria, polydipsia -> though patient is drinking regular soda. Wife will purchase flavored water w/o phenylalanine, but patient will purchase his own drinks when he is out . Cardiovascular risk reduction (follows w/ Dr. Humphrey Rolls, reports appt w/ ECHO scheduled 08/03/19) o ECHO scheduled 5/17  o Current hypertensive regimen: lisinopril 10 mg daily  o Current hyperlipidemia regimen: atorvastatin 80 mg; fenofibrate 48 mg daily o Current antiplatelet regimen: ASA 81 mg daily;  . Chronic kidney disease: appt w/ Kolluru scheduled 07/24/19 . S/p CVA: appt w/ Potter scheduled 07/27/19 . Allergies/asthma: albuterol PRN wheezing/SOB; loratidine 10 mg daily and montelukast 10 mg daily  . Health maintenance: Vitamin D 50,000 IU weekly Q Monday  Pharmacist Clinical Goal(s):  [name] Over the next 90 days, patient with work with PharmD and primary care provider to address optimized medication management  Interventions: . Comprehensive medication review performed, medication list updated in electronic medical record . Inter-disciplinary care team collaboration (see longitudinal plan of care) . Given elevated sugars and  difficult to resolve infection, recommend sooner PCP f/u. Scheduled patient for appt here in clinic in 2 days. Discussed potential of adding injectable medications w/ [name]- she notes that she would be able to administer the medication for the patient if shown how. Could consider GLP1 vs basal insulin. Would prefer GLP1 to reduce risk of hypoglycemia, though insulin would have faster onset of action in regard to controlling sugars to help clear infection. Could do basal insulin for a period of time, attain sugar control, and switch to GLP1. Will collaborate w/ PCP on this  Patient Self Care Activities:  . Patient will check blood glucose daily , document, and provide at future appointments . Patient will take medications as prescribed . Patient will report any questions or concerns to provider   Please see past updates related to this goal by clicking on the "Past Updates" button in the selected goal         Patient verbalizes understanding of instructions provided today.    Plan:  - Scheduled f/u call 08/14/19  Catie Darnelle Maffucci, PharmD, [place] [phone]

## 2019-07-17 ENCOUNTER — Encounter: Payer: Self-pay | Admitting: Nurse Practitioner

## 2019-07-17 ENCOUNTER — Ambulatory Visit (INDEPENDENT_AMBULATORY_CARE_PROVIDER_SITE_OTHER): Payer: Medicare Other | Admitting: Nurse Practitioner

## 2019-07-17 ENCOUNTER — Other Ambulatory Visit: Payer: Self-pay

## 2019-07-17 VITALS — BP 102/56 | HR 55 | Temp 97.4°F | Wt 175.8 lb

## 2019-07-17 DIAGNOSIS — Z8673 Personal history of transient ischemic attack (TIA), and cerebral infarction without residual deficits: Secondary | ICD-10-CM | POA: Diagnosis not present

## 2019-07-17 DIAGNOSIS — I152 Hypertension secondary to endocrine disorders: Secondary | ICD-10-CM

## 2019-07-17 DIAGNOSIS — E1169 Type 2 diabetes mellitus with other specified complication: Secondary | ICD-10-CM

## 2019-07-17 DIAGNOSIS — E1165 Type 2 diabetes mellitus with hyperglycemia: Secondary | ICD-10-CM

## 2019-07-17 DIAGNOSIS — E1159 Type 2 diabetes mellitus with other circulatory complications: Secondary | ICD-10-CM | POA: Diagnosis not present

## 2019-07-17 DIAGNOSIS — E1129 Type 2 diabetes mellitus with other diabetic kidney complication: Secondary | ICD-10-CM

## 2019-07-17 DIAGNOSIS — E785 Hyperlipidemia, unspecified: Secondary | ICD-10-CM | POA: Diagnosis not present

## 2019-07-17 DIAGNOSIS — I10 Essential (primary) hypertension: Secondary | ICD-10-CM | POA: Diagnosis not present

## 2019-07-17 DIAGNOSIS — R809 Proteinuria, unspecified: Secondary | ICD-10-CM

## 2019-07-17 LAB — UA/M W/RFLX CULTURE, ROUTINE
Bilirubin, UA: NEGATIVE
Ketones, UA: NEGATIVE
Leukocytes,UA: NEGATIVE
Nitrite, UA: NEGATIVE
Protein,UA: NEGATIVE
RBC, UA: NEGATIVE
Specific Gravity, UA: 1.01 (ref 1.005–1.030)
Urobilinogen, Ur: 0.2 mg/dL (ref 0.2–1.0)
pH, UA: 5 (ref 5.0–7.5)

## 2019-07-17 LAB — BAYER DCA HB A1C WAIVED: HB A1C (BAYER DCA - WAIVED): >14 % — ABNORMAL HIGH (ref <7.0)

## 2019-07-17 LAB — MICROALBUMIN, URINE WAIVED
Creatinine, Urine Waived: 50 mg/dL (ref 10–300)
Microalb, Ur Waived: 10 mg/L (ref 0–19)

## 2019-07-17 MED ORDER — TRESIBA FLEXTOUCH 100 UNIT/ML ~~LOC~~ SOPN: 10.0000 [IU] | PEN_INJECTOR | Freq: Every day | SUBCUTANEOUS | 4 refills | Status: DC

## 2019-07-17 MED ORDER — PEN NEEDLES 32G X 4 MM MISC: 1.0000 | Freq: Every day | 4 refills | Status: DC

## 2019-07-17 NOTE — Assessment & Plan Note (Addendum)
Chronic, ongoing.  A1C today >14%.  Has been making poor diet choices.  Continue Jardiance and will start Tresiba 10 units QHS, first dose provided in office today and educated wife on how to administer.  Poor tolerance to Metformin in past and allergic to Sulfa.  Educated patient at length on effect of diabetes from head to toe and increased risk for recurrent CVA due to poor control.  Recommend they check his BS TID.  Consider initiation of GLP and transition off insulin if A1C improves (may need wife to assist him with this due to his learning disabilities).  Goal A1C <6.5%.  Continue collaboration with CCM team.

## 2019-07-17 NOTE — Assessment & Plan Note (Signed)
Chronic, ongoing.  Continue Lipitor and Fenofibrate. Lipid panel today.  Adjust doses as needed, if LDL >70 increase Lipitor to 80 MG daily.

## 2019-07-17 NOTE — Patient Instructions (Signed)

## 2019-07-17 NOTE — Assessment & Plan Note (Signed)
Chronic, stable with BP at goal in office.  Continue current medication regimen and collaboration with cardiology, adjust regimen as needed.  Obtain BMP and urine today.  Recommend they check his BP at home at least daily and document.  Return to office in 3 months for BP follow-up.

## 2019-07-17 NOTE — Assessment & Plan Note (Signed)
They are to call neurology for follow-up, have not done this as of yet. Continue ASA.  Goals A1C <6.5%, LDL <70, and BP <130/90.  Recommend use of Meclizine and Zofran as needed for dizziness post CVA, although this is improving.

## 2019-07-17 NOTE — Assessment & Plan Note (Signed)
Refer to uncontrolled Type 2 diabetes for current plan.  Obtain urine micro today: ALB 10 and A:C 30-300.  UA wit 3+ glucose.  Continue Lisinopril for kidney protection.

## 2019-07-17 NOTE — Progress Notes (Signed)
BP (!) 102/56   Pulse (!) 55   Temp (!) 97.4 F (36.3 C) (Oral)   Wt 175 lb 12.8 oz (79.7 kg)   SpO2 97%   BMI 30.18 kg/m    Subjective:    Patient ID: Alex Metro., male    DOB: 04/01/1960, 59 y.o.   MRN: [id number]  HPI: [name]. is a 59 y.o. male  Chief Complaint  Patient presents with  . Diabetes  . Hypertension  . Hyperlipidemia   His wife is present at bedside to assist with HPI as patient poor historian.  DIABETES Continues on Jardiance 25 MG. Recent A1C by [facility] NP on visit was 10.2.  His wife reports his sugars are going up at home -- he has returned to drinking sodas.  Not keeping them in the house, but he is going to get them at store.  Has had an eye infection for the past month, has been seeing ophthalmology, his wife reports that the doctor there feels sugars may be elevated due to this too. His wife does endorse since infection sugars have been elevated. Has tried Metformin in past, but affected kidney function. Hypoglycemic episodes:no Polydipsia/polyuria: no -- however endorses more neuropathy discomfort Visual disturbance: no Chest pain: no Paresthesias: no Glucose Monitoring: yes             Accucheck frequency: Daily             Fasting glucose: this morning was 300 something -- ate chicken biscuit this morning -- average in 100-200 range             Post prandial:             Evening:             Before meals: Taking Insulin?: no             Long acting insulin:             Short acting insulin: Blood Pressure Monitoring: daily Retinal Examination: Not up to Date Foot Exam: Up to Date Pneumovax: Up to Date Influenza: Up to Date Aspirin: yes   HYPERTENSION / HYPERLIPIDEMIA 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  Stop date for Plavix was 03/27/2019.  Sees Dr. Humphrey Rolls for cardiology, next May 17th and his wife  reports they are going to do another echo and coronary artery screen. Current medications Lisinopril, Atorvastatin. Fenofibrate,  Satisfied with current treatment? yes Duration of hypertension: chronic BP monitoring frequency: daily BP range:  BP medication side effects: no Duration of hyperlipidemia: chronic Cholesterol medication side effects: no Cholesterol supplements: none Medication compliance: good compliance Aspirin: yes Recent stressors: no Recurrent headaches: no Visual changes: no Palpitations: no Dyspnea: no Chest pain: no Lower extremity edema: no Dizzy/lightheaded: no  Relevant past medical, surgical, family and social history reviewed and updated as indicated. Interim medical history since our last visit reviewed. Allergies and medications reviewed and updated.  Review of Systems  Constitutional: Negative for activity change, diaphoresis, fatigue and fever.  Respiratory: Negative for cough, chest tightness, shortness of breath and wheezing.   Cardiovascular: Negative for chest pain, palpitations and leg swelling.  Gastrointestinal: Negative.   Endocrine: Negative for cold intolerance, heat intolerance, polydipsia, polyphagia and polyuria.  Neurological: Negative.   Psychiatric/Behavioral: Negative.     Per HPI unless specifically indicated above  Objective:    BP (!) 102/56   Pulse (!) 55   Temp (!) 97.4 F (36.3 C) (Oral)   Wt 175 lb 12.8 oz (79.7 kg)   SpO2 97%   BMI 30.18 kg/m   Wt Readings from Last 3 Encounters:  07/17/19 175 lb 12.8 oz (79.7 kg)  03/25/19 169 lb 3.2 oz (76.7 kg)  03/02/19 176 lb 2.4 oz (79.9 kg)    Physical Exam Vitals and nursing note reviewed.  Constitutional:      General: He is awake. He is not in acute distress.    Appearance: He is well-developed. He is obese. He is not ill-appearing.  HENT:     Head: Normocephalic and atraumatic.     Right Ear: Hearing normal. No drainage.     Left Ear: Hearing normal. No  drainage.  Eyes:     General: Lids are normal.        Right eye: No discharge.        Left eye: No discharge.     Conjunctiva/sclera: Conjunctivae normal.     Pupils: Pupils are equal, round, and reactive to light.  Neck:     Thyroid: No thyromegaly.     Vascular: No carotid bruit.  Cardiovascular:     Rate and Rhythm: Normal rate and regular rhythm.     Heart sounds: Normal heart sounds, S1 normal and S2 normal. No murmur. No gallop.   Pulmonary:     Effort: Pulmonary effort is normal. No accessory muscle usage or respiratory distress.     Breath sounds: Normal breath sounds.  Abdominal:     General: Bowel sounds are normal.     Palpations: Abdomen is soft. There is no hepatomegaly or splenomegaly.  Musculoskeletal:        General: Normal range of motion.     Cervical back: Normal range of motion and neck supple.     Right lower leg: No edema.     Left lower leg: No edema.  Skin:    General: Skin is warm and dry.     Capillary Refill: Capillary refill takes less than 2 seconds.  Neurological:     Mental Status: He is alert and oriented to person, place, and time.     Deep Tendon Reflexes: Reflexes are normal and symmetric.  Psychiatric:        Attention and Perception: Attention normal.        Mood and Affect: Mood normal.        Speech: Speech normal.        Behavior: Behavior normal. Behavior is cooperative.    Results for orders placed or performed in visit on 03/25/19  CBC with Differential/Platelet out  Result Value Ref Range   WBC 6.0 3.4 - 10.8 x10E3/uL   RBC 5.20 4.14 - 5.80 x10E6/uL   Hemoglobin 16.2 13.0 - 17.7 g/dL   Hematocrit 48.5 37.5 - 51.0 %   MCV 93 79 - 97 fL   MCH 31.2 26.6 - 33.0 pg   MCHC 33.4 31.5 - 35.7 g/dL   RDW 13.3 11.6 - 15.4 %   Platelets 174 150 - 450 x10E3/uL   Neutrophils 70 Not Estab. %   Lymphs 20 Not Estab. %   Monocytes 7 Not Estab. %   Eos 2 Not Estab. %   Basos 1 Not Estab. %   Neutrophils Absolute 4.3 1.4 - 7.0 x10E3/uL    Lymphocytes Absolute 1.2 0.7 - 3.1 x10E3/uL   Monocytes Absolute 0.4 0.1 -  0.9 x10E3/uL   EOS (ABSOLUTE) 0.1 0.0 - 0.4 x10E3/uL   Basophils Absolute 0.0 0.0 - 0.2 x10E3/uL   Immature Granulocytes 0 Not Estab. %   Immature Grans (Abs) 0.0 0.0 - 0.1 x10E3/uL  Comprehensive metabolic panel  Result Value Ref Range   Glucose 240 (H) 65 - 99 mg/dL   BUN 13 6 - 24 mg/dL   Creatinine, Ser 1.05 0.76 - 1.27 mg/dL   GFR calc non Af Amer 79 >59 mL/min/1.73   GFR calc Af Amer 91 >59 mL/min/1.73   BUN/Creatinine Ratio 12 9 - 20   Sodium 139 134 - 144 mmol/L   Potassium 4.9 3.5 - 5.2 mmol/L   Chloride 101 96 - 106 mmol/L   CO2 20 20 - 29 mmol/L   Calcium 9.6 8.7 - 10.2 mg/dL   Total Protein 7.4 6.0 - 8.5 g/dL   Albumin 4.9 3.8 - 4.9 g/dL   Globulin, Total 2.5 1.5 - 4.5 g/dL   Albumin/Globulin Ratio 2.0 1.2 - 2.2   Bilirubin Total 0.4 0.0 - 1.2 mg/dL   Alkaline Phosphatase 173 (H) 39 - 117 IU/L   AST 26 0 - 40 IU/L   ALT 31 0 - 44 IU/L  Lipid Panel w/o Chol/HDL Ratio out  Result Value Ref Range   Cholesterol, Total 177 100 - 199 mg/dL   Triglycerides 125 0 - 149 mg/dL   HDL 51 >39 mg/dL   VLDL Cholesterol Cal 22 5 - 40 mg/dL   LDL Chol Calc (NIH) 104 (H) 0 - 99 mg/dL  Microalbumin, Urine Waived here  Result Value Ref Range   Microalb, Ur Waived 80 (H) 0 - 19 mg/L   Creatinine, Urine Waived 50 10 - 300 mg/dL   Microalb/Creat Ratio >300 (H) <30 mg/g  TSH  Result Value Ref Range   TSH 0.994 0.450 - 4.500 uIU/mL  Vit D  25 hydroxy (rtn osteoporosis monitoring)  Result Value Ref Range   Vit D, 25-Hydroxy 26.6 (L) 30.0 - 100.0 ng/mL      Assessment & Plan:   Problem List Items Addressed This Visit      Cardiovascular and Mediastinum   Hypertension associated with diabetes ([place])    Chronic, stable with BP at goal in office.  Continue current medication regimen and collaboration with cardiology, adjust regimen as needed.  Obtain BMP and urine today.  Recommend they check his BP at home  at least daily and document.  Return to office in 3 months for BP follow-up.      Relevant Medications   insulin degludec (TRESIBA FLEXTOUCH) 100 UNIT/ML FlexTouch Pen   Other Relevant Orders   Basic metabolic panel   Bayer DCA Hb A1c Waived     Endocrine   Hyperlipidemia associated with type 2 diabetes mellitus (HCC)    Chronic, ongoing.  Continue Lipitor and Fenofibrate. Lipid panel today.  Adjust doses as needed, if LDL >70 increase Lipitor to 80 MG daily.        Relevant Medications   insulin degludec (TRESIBA FLEXTOUCH) 100 UNIT/ML FlexTouch Pen   Other Relevant Orders   Bayer DCA Hb A1c Waived   Lipid Panel w/o Chol/HDL Ratio   Uncontrolled type 2 diabetes mellitus with hyperglycemia (HCC) - Primary    Chronic, ongoing.  A1C today >14%.  Has been making poor diet choices.  Continue Jardiance and will start Tresiba 10 units QHS, first dose provided in office today and educated wife on how to administer.  Poor tolerance to Metformin  in past and allergic to Sulfa.  Educated patient at length on effect of diabetes from head to toe and increased risk for recurrent CVA due to poor control.  Recommend they check his BS TID.  Consider initiation of GLP and transition off insulin if A1C improves (may need wife to assist him with this due to his learning disabilities).  Goal A1C <6.5%.  Continue collaboration with CCM team.      Relevant Medications   insulin degludec (TRESIBA FLEXTOUCH) 100 UNIT/ML FlexTouch Pen   Other Relevant Orders   Microalbumin, Urine Waived   Bayer DCA Hb A1c Waived   UA/M w/rflx Culture, Routine   Type 2 diabetes mellitus with proteinuria ([place])    Refer to uncontrolled Type 2 diabetes for current plan.  Obtain urine micro today: ALB 10 and A:C 30-300.  UA wit 3+ glucose.  Continue Lisinopril for kidney protection.      Relevant Medications   insulin degludec (TRESIBA FLEXTOUCH) 100 UNIT/ML FlexTouch Pen     Other   History of CVA (cerebrovascular accident)      They are to call neurology for follow-up, have not done this as of yet. Continue ASA.  Goals A1C <6.5%, LDL <70, and BP <130/90.  Recommend use of Meclizine and Zofran as needed for dizziness post CVA, although this is improving.         Time: 25 minutes, >50% spent counseling wife and him on diabetes + insulin use  Follow up plan: Return for as schedule May 12th -- ensure 30 minute visit slot.

## 2019-07-18 LAB — BASIC METABOLIC PANEL
BUN/Creatinine Ratio: 12 (ref 9–20)
BUN: 14 mg/dL (ref 6–24)
CO2: 19 mmol/L — ABNORMAL LOW (ref 20–29)
Calcium: 9.1 mg/dL (ref 8.7–10.2)
Chloride: 105 mmol/L (ref 96–106)
Creatinine, Ser: 1.2 mg/dL (ref 0.76–1.27)
GFR calc Af Amer: 78 mL/min/{1.73_m2} (ref >59)
GFR calc non Af Amer: 67 mL/min/{1.73_m2} (ref >59)
Glucose: 253 mg/dL — ABNORMAL HIGH (ref 65–99)
Potassium: 4.2 mmol/L (ref 3.5–5.2)
Sodium: 138 mmol/L (ref 134–144)

## 2019-07-18 LAB — LIPID PANEL W/O CHOL/HDL RATIO
Cholesterol, Total: 160 mg/dL (ref 100–199)
HDL: 35 mg/dL — ABNORMAL LOW (ref >39)
LDL Chol Calc (NIH): 85 mg/dL (ref 0–99)
Triglycerides: 240 mg/dL — ABNORMAL HIGH (ref 0–149)
VLDL Cholesterol Cal: 40 mg/dL (ref 5–40)

## 2019-07-24 ENCOUNTER — Ambulatory Visit: Payer: Self-pay

## 2019-07-24 ENCOUNTER — Telehealth: Payer: Self-pay | Admitting: Nurse Practitioner

## 2019-07-24 DIAGNOSIS — E1129 Type 2 diabetes mellitus with other diabetic kidney complication: Secondary | ICD-10-CM | POA: Diagnosis not present

## 2019-07-24 DIAGNOSIS — N390 Urinary tract infection, site not specified: Secondary | ICD-10-CM | POA: Diagnosis not present

## 2019-07-24 DIAGNOSIS — I10 Essential (primary) hypertension: Secondary | ICD-10-CM | POA: Diagnosis not present

## 2019-07-24 DIAGNOSIS — R809 Proteinuria, unspecified: Secondary | ICD-10-CM | POA: Diagnosis not present

## 2019-07-24 NOTE — Chronic Care Management (AMB) (Signed)
  Care Management   Follow Up Note   07/24/2019 Name: [name]. MRN: [id number] DOB: [birth year]  Referred by: [name], NP Reason for referral : Care Coordination   [name] is a 59 y.o. year old male who is a primary care patient of Cannady, Barbaraann Faster, NP. The care management team was consulted for assistance with care management and care coordination needs.    Review of patient status, including review of consultants reports, relevant laboratory and other test results, and collaboration with appropriate care team members and the patient's provider was performed as part of comprehensive patient evaluation and provision of chronic care management services.    LCSW completed CCM outreach attempt today but was unable to reach patient successfully. A HIPPA compliant voice message was left encouraging patient to return call once available. LCSW rescheduled CCM SW appointment as well.  A HIPPA compliant phone message was left for the patient providing contact information and requesting a return call.   Eula Fried, BSW, MSW, [facility] Care Management [name] Phone: [phone]

## 2019-07-24 NOTE — Telephone Encounter (Signed)
  Community Resource Referral   [place] 07/24/2019  [facility]  Name: Zebulen Simonis.   MRN: [id number]   DOB: 25-Jun-1960   AGE: 59 y.o.   GENDER: male   PCP [name], NP.   Called pt regarding Community Resource Referral follow up for paperwork dropped off  [employer] . [email or web address]  [phone]

## 2019-07-29 ENCOUNTER — Ambulatory Visit (INDEPENDENT_AMBULATORY_CARE_PROVIDER_SITE_OTHER): Payer: Medicare Other | Admitting: Nurse Practitioner

## 2019-07-29 ENCOUNTER — Encounter: Payer: Self-pay | Admitting: Nurse Practitioner

## 2019-07-29 ENCOUNTER — Other Ambulatory Visit: Payer: Self-pay

## 2019-07-29 VITALS — BP 117/72 | HR 56 | Temp 97.8°F | Wt 177.0 lb

## 2019-07-29 DIAGNOSIS — E6609 Other obesity due to excess calories: Secondary | ICD-10-CM

## 2019-07-29 DIAGNOSIS — E1165 Type 2 diabetes mellitus with hyperglycemia: Secondary | ICD-10-CM | POA: Diagnosis not present

## 2019-07-29 DIAGNOSIS — M1990 Unspecified osteoarthritis, unspecified site: Secondary | ICD-10-CM | POA: Diagnosis not present

## 2019-07-29 DIAGNOSIS — E700 Classical phenylketonuria: Secondary | ICD-10-CM

## 2019-07-29 DIAGNOSIS — I77811 Abdominal aortic ectasia: Secondary | ICD-10-CM | POA: Diagnosis not present

## 2019-07-29 DIAGNOSIS — Z125 Encounter for screening for malignant neoplasm of prostate: Secondary | ICD-10-CM

## 2019-07-29 DIAGNOSIS — J439 Emphysema, unspecified: Secondary | ICD-10-CM

## 2019-07-29 DIAGNOSIS — E701 Other hyperphenylalaninemias: Secondary | ICD-10-CM

## 2019-07-29 DIAGNOSIS — Z6830 Body mass index (BMI) 30.0-30.9, adult: Secondary | ICD-10-CM

## 2019-07-29 DIAGNOSIS — Z1211 Encounter for screening for malignant neoplasm of colon: Secondary | ICD-10-CM

## 2019-07-29 NOTE — Assessment & Plan Note (Addendum)
Chronic, ongoing.  A1C recently  >14% and fasting sugars at this time above goal.  Has been making poor diet choices, but wife reports he has gotten rid of sodas.  Continue Jardiance and increase Tresiba to 15 units QHS, discussed with his wife, if fasting BS remain >130 next week then increase to 20 units.  Poor tolerance to Metformin in past and allergic to Sulfa.  Educated patient at length on effect of diabetes from head to toe and increased risk for recurrent CVA due to poor control.  Recommend they check his BS TID.  Consider initiation of GLP and transition off insulin if A1C improves (may need wife to assist him with this due to his learning disabilities).  Goal A1C <6.5%.  Continue collaboration with CCM team.  Return in 4 weeks.

## 2019-07-29 NOTE — Patient Instructions (Signed)
Acute Knee Pain, Adult Many things can cause knee pain. Sometimes, knee pain is sudden (acute) and may be caused by damage, swelling, or irritation of the muscles and tissues that support your knee. The pain often goes away on its own with time and rest. If the pain does not go away, tests may be done to find out what is causing the pain. Follow these instructions at home: Pay attention to any changes in your symptoms. Take these actions to relieve your pain. If you have a knee sleeve or brace:   Wear the sleeve or brace as told by your doctor. Remove it only as told by your doctor.  Loosen the sleeve or brace if your toes: ? Tingle. ? Become numb. ? Turn cold and blue.  Keep the sleeve or brace clean.  If the sleeve or brace is not waterproof: ? Do not let it get wet. ? Cover it with a watertight covering when you take a bath or shower. Activity  Rest your knee.  Do not do things that cause pain.  Avoid activities where both feet leave the ground at the same time (high-impact activities). Examples are running, jumping rope, and doing jumping jacks.  Work with a physical therapist to make a safe exercise program, as told by your doctor. Managing pain, stiffness, and swelling   If told, put ice on the knee: ? Put ice in a plastic bag. ? Place a towel between your skin and the bag. ? Leave the ice on for 20 minutes, 2-3 times a day.  If told, put pressure (compression) on your injured knee to control swelling, give support, and help with discomfort. Compression may be done with an elastic bandage. General instructions  Take all medicines only as told by your doctor.  Raise (elevate) your knee while you are sitting or lying down. Make sure your knee is higher than your heart.  Sleep with a pillow under your knee.  Do not use any products that contain nicotine or tobacco. These include cigarettes, e-cigarettes, and chewing tobacco. These products may slow down healing. If  you need help quitting, ask your doctor.  If you are overweight, work with your doctor and a food expert (dietitian) to set goals to lose weight. Being overweight can make your knee hurt more.  Keep all follow-up visits as told by your doctor. This is important. Contact a doctor if:  The knee pain does not stop.  The knee pain changes or gets worse.  You have a fever along with knee pain.  Your knee feels warm when you touch it.  Your knee gives out or locks up. Get help right away if:  Your knee swells, and the swelling gets worse.  You cannot move your knee.  You have very bad knee pain. Summary  Many things can cause knee pain. The pain often goes away on its own with time and rest.  Your doctor may do tests to find out the cause of the pain.  Pay attention to any changes in your symptoms. Relieve your pain with rest, medicines, light activity, and use of ice.  Get help right away if you cannot move your knee or your knee pain is very bad. This information is not intended to replace advice given to you by your health care provider. Make sure you discuss any questions you have with your health care provider. Document Revised: 08/15/2017 Document Reviewed: 08/15/2017 Elsevier Patient Education  [place].

## 2019-07-29 NOTE — Assessment & Plan Note (Signed)
Chronic, stable with no symptoms.  Continue tight BP control and statin + ASA daily.  Discussed with him importance of diabetes control.  Repeat ultrasound August 2021.

## 2019-07-29 NOTE — Assessment & Plan Note (Signed)
Mental delays present, continue to work with CCM team in [place] with education and reiterating diet needs.

## 2019-07-29 NOTE — Progress Notes (Signed)
BP 117/72   Pulse (!) 56   Temp 97.8 F (36.6 C) (Oral)   Wt 177 lb (80.3 kg)   SpO2 96%   BMI 30.38 kg/m    Subjective:    Patient ID: [name]., male    DOB: 1960/10/01, 59 y.o.   MRN: [id number]  HPI: [name]. is a 59 y.o. male  Chief Complaint  Patient presents with  . Diabetes    4 week f/up- upcoming appointment with Dr. Ellin Mayhew for eye exam per patient  . Knee Pain    pt states he has been having bad left knee pain   DIABETES Recent A1C on 07/17/19 was >14% and Tresiba 10 units added to regimen.  He continues on Jardiance 25 MG along with [place].    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  Has seen dietician.  He reports he has stopped drinking sodas.  He would like PSA checked today, has not checked since 2019 when was 0.3. Hypoglycemic episodes:no Polydipsia/polyuria: yes Visual disturbance: no Chest pain: no Paresthesias: no Glucose Monitoring: yes  Accucheck frequency: BID  Fasting glucose: 157 to 265 -- 200's are after he eats and when rechecks comes down to 160-180 range  Post prandial: 200-300 range  Evening:  Before meals: Taking Insulin?: yes  Long acting insulin:  Short acting insulin: Blood Pressure Monitoring: not checking Retinal Examination: Not up to Date Foot Exam: Up to Date Pneumovax: Up to Date Influenza: Up to Date Aspirin: yes   COPD Has Albuterol uses as needed, did previously take Flovent.  Had ultrasound in August 2016 -- noted ectatic abdominal aorta is to repeat in 5 years (August 2021). COPD status: stable Satisfied with current treatment?: yes Oxygen use: no Dyspnea frequency: none Cough frequency: none Rescue inhaler frequency:  minimally Limitation of activity: no Productive cough: none Last Spirometry: July 2016 Pneumovax: Up to Date Influenza: Up to Date  KNEE PAIN (LEFT) Had imaging August 2020  noting mild degenerative changes without acute osseous.  Knee pain has been ongoing and patient very hyper focused on this.  Had ortho referral 10/31/2018, but never attended visit.  He reports has cyst to left knee for a good while and pain is present when he bends down, hard to get back.  He states the cyst is turning blue.  Does take Meloxicam as needed.   Duration: chronic Involved knee: left Mechanism of injury: unknown Location:diffuse Onset: gradual Severity: 10/10  Quality:  dull, aching and throbbing Frequency: intermittent Radiation: no Aggravating factors: weight bearing, walking, running, bending and movement  Alleviating factors: Meloxicam, ice and rest  Status: fluctuating Treatments attempted: Meloxicam and rest  Relief with NSAIDs?:  mild Weakness with weight bearing or walking: no Sensation of giving way: yes Locking: no Popping: no Bruising: no Swelling: no Redness: no Paresthesias/decreased sensation: no Fevers: no  Relevant past medical, surgical, family and social history reviewed and updated as indicated. Interim medical history since our last visit reviewed. Allergies and medications reviewed and updated.  Review of Systems  Constitutional: Negative for activity change, diaphoresis, fatigue and fever.  Respiratory: Negative for cough, chest tightness, shortness of breath and wheezing.   Cardiovascular: Negative for chest pain, palpitations and leg swelling.  Gastrointestinal: Negative.   Endocrine: Negative for cold intolerance, heat intolerance, polydipsia, polyphagia and polyuria.  Musculoskeletal: Positive for arthralgias.  Neurological: Negative.   Psychiatric/Behavioral: Negative.  Per HPI unless specifically indicated above     Objective:    BP 117/72   Pulse (!) 56   Temp 97.8 F (36.6 C) (Oral)   Wt 177 lb (80.3 kg)   SpO2 96%   BMI 30.38 kg/m   Wt Readings from Last 3 Encounters:  07/29/19 177 lb (80.3 kg)  07/17/19 175 lb 12.8  oz (79.7 kg)  03/25/19 169 lb 3.2 oz (76.7 kg)    Physical Exam Vitals and nursing note reviewed.  Constitutional:      General: He is awake. He is not in acute distress.    Appearance: He is well-developed. He is obese. He is not ill-appearing.  HENT:     Head: Normocephalic and atraumatic.     Right Ear: Hearing normal. No drainage.     Left Ear: Hearing normal. No drainage.  Eyes:     General: Lids are normal.        Right eye: No discharge.        Left eye: No discharge.     Conjunctiva/sclera: Conjunctivae normal.     Pupils: Pupils are equal, round, and reactive to light.  Neck:     Thyroid: No thyromegaly.     Vascular: No carotid bruit.  Cardiovascular:     Rate and Rhythm: Normal rate and regular rhythm.     Heart sounds: Normal heart sounds, S1 normal and S2 normal. No murmur. No gallop.   Pulmonary:     Effort: Pulmonary effort is normal. No accessory muscle usage or respiratory distress.     Breath sounds: Normal breath sounds.  Abdominal:     General: Bowel sounds are normal.     Palpations: Abdomen is soft. There is no hepatomegaly or splenomegaly.  Musculoskeletal:        General: Normal range of motion.     Cervical back: Normal range of motion and neck supple.     Right knee: Normal.     Left knee: No swelling, erythema, ecchymosis, lacerations or crepitus. Normal range of motion. No tenderness.     Right lower leg: No edema.     Left lower leg: No edema.       Legs:  Skin:    General: Skin is warm and dry.     Capillary Refill: Capillary refill takes less than 2 seconds.  Neurological:     Mental Status: He is alert and oriented to person, place, and time.     Deep Tendon Reflexes: Reflexes are normal and symmetric.  Psychiatric:        Attention and Perception: Attention normal.        Mood and Affect: Mood normal.        Speech: Speech normal.        Behavior: Behavior normal. Behavior is cooperative.     Results for orders placed or performed  in visit on 42/35/36  Basic metabolic panel  Result Value Ref Range   Glucose 253 (H) 65 - 99 mg/dL   BUN 14 6 - 24 mg/dL   Creatinine, Ser 1.20 0.76 - 1.27 mg/dL   GFR calc non Af Amer 67 >59 mL/min/1.73   GFR calc Af Amer 78 >59 mL/min/1.73   BUN/Creatinine Ratio 12 9 - 20   Sodium 138 134 - 144 mmol/L   Potassium 4.2 3.5 - 5.2 mmol/L   Chloride 105 96 - 106 mmol/L   CO2 19 (L) 20 - 29 mmol/L   Calcium 9.1 8.7 - 10.2 mg/dL  Microalbumin, Urine Waived  Result Value Ref Range   Microalb, Ur Waived 10 0 - 19 mg/L   Creatinine, Urine Waived 50 10 - 300 mg/dL   Microalb/Creat Ratio 30-300 (H) <30 mg/g  Bayer DCA Hb A1c Waived  Result Value Ref Range   HB A1C (BAYER DCA - WAIVED) >14.0 (H) <7.0 %  Lipid Panel w/o Chol/HDL Ratio  Result Value Ref Range   Cholesterol, Total 160 100 - 199 mg/dL   Triglycerides 240 (H) 0 - 149 mg/dL   HDL 35 (L) >39 mg/dL   VLDL Cholesterol Cal 40 5 - 40 mg/dL   LDL Chol Calc (NIH) 85 0 - 99 mg/dL  UA/M w/rflx Culture, Routine   Specimen: Urine   URINE  Result Value Ref Range   Specific Gravity, UA 1.010 1.005 - 1.030   pH, UA 5.0 5.0 - 7.5   Color, UA Yellow Yellow   Appearance Ur Clear Clear   Leukocytes,UA Negative Negative   Protein,UA Negative Negative/Trace   Glucose, UA 3+ (A) Negative   Ketones, UA Negative Negative   RBC, UA Negative Negative   Bilirubin, UA Negative Negative   Urobilinogen, Ur 0.2 0.2 - 1.0 mg/dL   Nitrite, UA Negative Negative      Assessment & Plan:   Problem List Items Addressed This Visit      Cardiovascular and Mediastinum   Ectatic abdominal aorta (HCC)    Chronic, stable with no symptoms.  Continue tight BP control and statin + ASA daily.  Discussed with him importance of diabetes control.  Repeat ultrasound August 2021.        Respiratory   Pulmonary emphysema (HCC)    Chronic, ongoing.  Continue current medication regimen and adjust as needed.  Recommend using Albuterol as needed if episodes of  SOB present.  Plan on spirometry next visit.        Endocrine   Uncontrolled type 2 diabetes mellitus with hyperglycemia (HCC) - Primary    Chronic, ongoing.  A1C recently  >14% and fasting sugars at this time above goal.  Has been making poor diet choices, but wife reports he has gotten rid of sodas.  Continue Jardiance and increase Tresiba to 15 units QHS, discussed with his wife, if fasting BS remain >130 next week then increase to 20 units.  Poor tolerance to Metformin in past and allergic to Sulfa.  Educated patient at length on effect of diabetes from head to toe and increased risk for recurrent CVA due to poor control.  Recommend they check his BS TID.  Consider initiation of GLP and transition off insulin if A1C improves (may need wife to assist him with this due to his learning disabilities).  Goal A1C <6.5%.  Continue collaboration with CCM team.  Return in 4 weeks.        Musculoskeletal and Integument   Arthritis    To multiple areas, at present left knee is issue.  Missed visit with ortho last year.  Has cyst-like area to lateral aspect of left knee which would benefit assessment by ortho and recommendations.  Referral to ortho placed, notified his wife of this referral.  Recommend continued use of Meloxicam as needed only at home, use minimally as possible.  Use of knee sleeve for support when walking.  Recommend Voltaren gel, Icy/Hot Lidocaine patches, and alternate heat and ice as needed.  Return in 4 weeks.      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      Obesity    Recommended eating smaller high protein, low fat meals more frequently and exercising 30 mins a day 5 times a week with a goal of 10-15lb weight loss in the next 3 months. Patient voiced their understanding and motivation to adhere to these  recommendations.        Other Visit Diagnoses    Colon cancer screening       Cologuard ordered   Relevant Orders   Cologuard   Prostate cancer screening       Check PSA today, has been 2 years since last check   Relevant Orders   PSA      Time: 25 minutes, >50% spent counseling and educating on diabetes and medication changes   Follow up plan: Return in about 4 weeks (around 08/26/2019) for T2DM.

## 2019-07-29 NOTE — Assessment & Plan Note (Signed)
Chronic, ongoing.  Continue current medication regimen and adjust as needed.  Recommend using Albuterol as needed if episodes of SOB present.  Plan on spirometry next visit.

## 2019-07-29 NOTE — Assessment & Plan Note (Signed)
Recommended eating smaller high protein, low fat meals more frequently and exercising 30 mins a day 5 times a week with a goal of 10-15lb weight loss in the next 3 months. Patient voiced their understanding and motivation to adhere to these recommendations.

## 2019-07-29 NOTE — Assessment & Plan Note (Signed)
To multiple areas, at present left knee is issue.  Missed visit with ortho last year.  Has cyst-like area to lateral aspect of left knee which would benefit assessment by ortho and recommendations.  Referral to ortho placed, notified his wife of this referral.  Recommend continued use of Meloxicam as needed only at home, use minimally as possible.  Use of knee sleeve for support when walking.  Recommend Voltaren gel, Icy/Hot Lidocaine patches, and alternate heat and ice as needed.  Return in 4 weeks.

## 2019-07-30 LAB — PSA: Prostate Specific Ag, Serum: 0.3 ng/mL (ref 0.0–4.0)

## 2019-07-30 NOTE — Progress Notes (Signed)
Please let [name] and [name] know that his prostate labs returned within normal range.  Good news.  Have a great day!!

## 2019-08-03 ENCOUNTER — Telehealth: Payer: Self-pay | Admitting: Nurse Practitioner

## 2019-08-03 NOTE — Telephone Encounter (Signed)
Called and left patient's wife a VM asking for her to please return my call.

## 2019-08-03 NOTE — Telephone Encounter (Signed)
[name] notified of Jolene's response.

## 2019-08-03 NOTE — Telephone Encounter (Signed)
Routing to provider to advise.

## 2019-08-03 NOTE — Telephone Encounter (Signed)
Please let [name] know this should be okay and unsure if related to insulin.  I would continue current regimen.  He may be better hydrated now he is cutting out soda and drinking more water, this will at times make veins more prominent.  Have a great day!!

## 2019-08-03 NOTE — Telephone Encounter (Signed)
When Pt was given his insulin his veins were showing and it never happened before and they are now gone/ ([name])she just wanted to make sure nothing was wrong and she was a bit worried/ please advise

## 2019-08-14 ENCOUNTER — Ambulatory Visit: Payer: Self-pay | Admitting: Pharmacist

## 2019-08-14 DIAGNOSIS — E1165 Type 2 diabetes mellitus with hyperglycemia: Secondary | ICD-10-CM

## 2019-08-14 DIAGNOSIS — E701 Other hyperphenylalaninemias: Secondary | ICD-10-CM

## 2019-08-14 NOTE — Patient Instructions (Signed)
Visit Information  Goals Addressed            This Visit's Progress     Patient Stated   . PharmD "I want to work on my diabetes" (pt-stated)       [name] (see longtitudinal plan of Alex for additional Alex plan information)  Current Barriers:  . 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 Notes that he has significant debt at the hospital, and assumes this would be enough to qualify for Medicaid. Notes she has called the [name], but voicemail was full and she couldn't leave a message.  . Current antihyperglycemic regimen: Jardiance 25 mg daily, Tresiba 15 units daily  o Metformin d/c d/t bump in Scr after restarted o Avoiding sulfonylurea d/t sulfa allergy  . Current blood glucose readings: o Fasting: 150-200s, did have 1 reading in the 120s, and one day of a reading >300 . Cardiovascular risk reduction (follows w/ Dr. Laurelyn Rogers) o Current hypertensive regimen: lisinopril 10 mg daily  o Current hyperlipidemia regimen: atorvastatin 80 mg; fenofibrate 48 mg daily o Current antiplatelet regimen: ASA 81 mg daily;  . Chronic kidney disease: follows w/ [name], last visit 07/24/19 . S/p CVA: follows w/ Dr. Melrose Rogers, appt on 6/7 . Allergies/asthma: albuterol PRN wheezing/SOB; loratidine 10 mg daily and montelukast 10 mg daily  . Health maintenance: Vitamin D 50,000 IU weekly Q Monday  Pharmacist Clinical Goal(s):  [name] Over the next 90 days, patient with work with PharmD and primary Alex provider to address optimized medication management  Interventions: . Comprehensive medication review performed, medication list updated in electronic medical record . Inter-disciplinary Alex team collaboration (see longitudinal plan of Alex) . Recommend he increase Tresiba to 18 units daily. [name] verbalized understanding.   . Verbally reviewed injection technique. [name] has been priming the pen by wasting 2 units daily; reviewed that she really only needs to do that with a new pen. She verbalized understanding. Discussed rotating sites that have fatty tissue.  . She notes that she worries that eating supper at 6-7 pm is too late. Reviewed that as long as [name] is monitoring portion sizes and reducing carbohydrate intake, that is fine.  . Placing a re-referral for [name] services for support in applying for Medicaid, as well as for housing support.   Patient Self Alex Activities:  . Patient [name] check blood glucose daily , document, and provide at future appointments . Patient [name] take medications as prescribed . Patient [name] report any questions or concerns to provider   Please see past updates related to this goal by clicking on the "Past Updates" button in the selected goal         Patient verbalizes understanding of instructions provided today.   Plan:  - Scheduled f/u call in ~ 8 weeks  [name], PharmD, [place] [phone]

## 2019-08-14 NOTE — Chronic Care Management (AMB) (Signed)
Chronic Care Management   Follow Up Note   08/14/2019 Name: [name]. MRN: [id number] DOB: 1960/09/10  Referred by: [name], NP Reason for referral : Chronic Care Management (Medication Management)   [name]. is a 59 y.o. year old male who is a primary care patient of Cannady, Barbaraann Faster, NP. The CCM team was consulted for assistance with chronic disease management and care coordination needs.   Contacted patient's wife, [name], for medication management review.    Review of patient status, including review of consultants reports, relevant laboratory and other test results, and collaboration with appropriate care team members and the patient's provider was performed as part of comprehensive patient evaluation and provision of chronic care management services.    SDOH (Social Determinants of Health) assessments performed: Yes See Care Plan activities for detailed interventions related to [facility])     Outpatient Encounter Medications as of 08/14/2019  Medication Sig Note  . empagliflozin (JARDIANCE) 25 MG TABS tablet Take 25 mg by mouth daily.   . insulin degludec (TRESIBA FLEXTOUCH) 100 UNIT/ML FlexTouch Pen Inject 0.1 mLs (10 Units total) into the skin daily. 08/14/2019: 15 units QPM  . acetaminophen (TYLENOL) 325 MG tablet Take 1-2 tablets (325-650 mg total) by mouth every 4 (four) hours as needed for mild pain.   [name] albuterol (PROAIR HFA) 108 (90 Base) MCG/ACT inhaler INHALE 2 PUFFS BY MOUTH EVERY 6 HOURS IF NEEDED FOR COUGH OR WHEEZING.   [name] aspirin 81 MG EC tablet Take 1 tablet (81 mg total) by mouth daily.   [name] atorvastatin (LIPITOR) 80 MG tablet Take 1 tablet (80 mg total) by mouth daily.   . Cholecalciferol (VITAMIN D3) 1.25 MG (50000 UT) CAPS Take 50,000 Units by mouth once a week.   . clobetasol cream (TEMOVATE) 7.16 % Apply 1 application topically 2 (two) times daily. Apply to affected area   . Dextran 70-Hypromellose (ARTIFICIAL TEARS) 0.1-0.3 %  SOLN Apply 1 drop to eye 3 (three) times daily. To right eye only.   [name] erythromycin ophthalmic ointment Place 1 application into the right eye at bedtime.    . fenofibrate 54 MG tablet Take 1 tablet (54 mg total) by mouth daily.   [name] gabapentin (NEURONTIN) 300 MG capsule Take 1 capsule (300 mg total) by mouth 2 (two) times daily.   [name] glucose blood (ONETOUCH ULTRA) test strip To check blood sugar three times daily   . Insulin Pen Needle (PEN NEEDLES) 32G X 4 MM MISC 1 kit by Does not apply route daily.   [name] lisinopril (ZESTRIL) 20 MG tablet Take 20 mg by mouth daily.   . meloxicam (MOBIC) 15 MG tablet TAKE 1 TABLET BY MOUTH ONCE DAILY   . montelukast (SINGULAIR) 10 MG tablet TAKE 1 TABLET BY MOUTH ONCE EVERY EVENING AT BEDTIME.   [name] neomycin-polymyxin b-dexamethasone (MAXITROL) 3.5-10000-0.1 SUSP Place 1 drop into the right eye.    . nitroGLYCERIN (NITROSTAT) 0.4 MG SL tablet Place 1 tablet (0.4 mg total) under the tongue every 5 (five) minutes as needed for chest pain.   [name] ofloxacin (OCUFLOX) 0.3 % ophthalmic solution Place 1 drop into the right eye 4 (four) times daily.   [name] omeprazole (PRILOSEC) 20 MG capsule Take 1 capsule (20 mg total) by mouth daily.   . polyethylene glycol (MIRALAX) 17 g packet Take 17 g by mouth daily.   [name] trimethoprim-polymyxin b (POLYTRIM) ophthalmic solution Place 1 drop into the right eye every 6 (six) hours.  No facility-administered encounter medications on file as of 08/14/2019.     Objective:   Goals Addressed            This Visit's Progress     Patient Stated   . PharmD "I want to work on my diabetes" (pt-stated)       [place] (see longtitudinal plan of care for additional care plan information)  Current Barriers:  . 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 Notes that he  has significant debt at the hospital, and assumes this would be enough to qualify for Medicaid. Notes she has called the Care Guide, but voicemail was full and she couldn't leave a message.  . Current antihyperglycemic regimen: Jardiance 25 mg daily, Tresiba 15 units daily  o Metformin d/c d/t bump in Scr after restarted o Avoiding sulfonylurea d/t sulfa allergy  . Current blood glucose readings: o Fasting: 150-200s, did have 1 reading in the 120s, and one day of a reading >300 . Cardiovascular risk reduction (follows w/ Dr. Laurelyn Sickle) o Current hypertensive regimen: lisinopril 10 mg daily  o Current hyperlipidemia regimen: atorvastatin 80 mg; fenofibrate 48 mg daily o Current antiplatelet regimen: ASA 81 mg daily;  . Chronic kidney disease: follows w/ Kolluru, last visit 07/24/19 . S/p CVA: follows w/ Dr. Melrose Nakayama, appt on 6/7 . Allergies/asthma: albuterol PRN wheezing/SOB; loratidine 10 mg daily and montelukast 10 mg daily  . Health maintenance: Vitamin D 50,000 IU weekly Q Monday  Pharmacist Clinical Goal(s):  [name] Over the next 90 days, patient with work with PharmD and primary care provider to address optimized medication management  Interventions: . Comprehensive medication review performed, medication list updated in electronic medical record . Inter-disciplinary care team collaboration (see longitudinal plan of care) . Recommend he increase Tresiba to 18 units daily. [name] verbalized understanding.  . Verbally reviewed injection technique. [name] has been priming the pen by wasting 2 units daily; reviewed that she really only needs to do that with a new pen. She verbalized understanding. Discussed rotating sites that have fatty tissue.  . She notes that she worries that eating supper at 6-7 pm is too late. Reviewed that as long as [name] is monitoring portion sizes and reducing carbohydrate intake, that is fine.  . Placing a re-referral for Care Guide services for support in applying for Medicaid,  as well as for housing support.   Patient Self Care Activities:  . Patient [name] check blood glucose daily , document, and provide at future appointments . Patient [name] take medications as prescribed . Patient [name] report any questions or concerns to provider   Please see past updates related to this goal by clicking on the "Past Updates" button in the selected goal          Plan:  - Scheduled f/u call in ~ 8 weeks  Catie Darnelle Maffucci, PharmD, [place] [phone]

## 2019-08-19 DIAGNOSIS — H16041 Marginal corneal ulcer, right eye: Secondary | ICD-10-CM | POA: Diagnosis not present

## 2019-08-31 ENCOUNTER — Ambulatory Visit: Payer: Medicare Other | Admitting: Nurse Practitioner

## 2019-09-01 ENCOUNTER — Telehealth: Payer: Self-pay

## 2019-09-01 NOTE — Telephone Encounter (Signed)
Copied from [place] [id number]. Topic: Referral - Status >> Sep 01, 2019  4:27 PM [name] wrote: [birth year] Left message on voicemail for patient to return my call regarding home repairs and disability. [name] [phone]

## 2019-09-02 ENCOUNTER — Encounter: Payer: Self-pay | Admitting: Nurse Practitioner

## 2019-09-02 ENCOUNTER — Ambulatory Visit (INDEPENDENT_AMBULATORY_CARE_PROVIDER_SITE_OTHER): Payer: Medicare Other

## 2019-09-02 ENCOUNTER — Ambulatory Visit (INDEPENDENT_AMBULATORY_CARE_PROVIDER_SITE_OTHER): Payer: Medicare Other | Admitting: Pharmacist

## 2019-09-02 ENCOUNTER — Other Ambulatory Visit: Payer: Self-pay

## 2019-09-02 ENCOUNTER — Ambulatory Visit (INDEPENDENT_AMBULATORY_CARE_PROVIDER_SITE_OTHER): Payer: Medicare Other | Admitting: Nurse Practitioner

## 2019-09-02 VITALS — BP 111/69 | HR 56 | Temp 97.5°F | Wt 176.4 lb

## 2019-09-02 DIAGNOSIS — J4520 Mild intermittent asthma, uncomplicated: Secondary | ICD-10-CM

## 2019-09-02 DIAGNOSIS — E1165 Type 2 diabetes mellitus with hyperglycemia: Secondary | ICD-10-CM

## 2019-09-02 DIAGNOSIS — Z0000 Encounter for general adult medical examination without abnormal findings: Secondary | ICD-10-CM | POA: Diagnosis not present

## 2019-09-02 DIAGNOSIS — M1990 Unspecified osteoarthritis, unspecified site: Secondary | ICD-10-CM

## 2019-09-02 MED ORDER — BREO ELLIPTA 100-25 MCG/INH IN AEPB: 1.0000 | INHALATION_SPRAY | Freq: Every day | RESPIRATORY_TRACT | 1 refills | Status: DC

## 2019-09-02 MED ORDER — ALBUTEROL SULFATE HFA 108 (90 BASE) MCG/ACT IN AERS: INHALATION_SPRAY | RESPIRATORY_TRACT | 3 refills | Status: DC

## 2019-09-02 NOTE — Progress Notes (Signed)
Subjective:   [name]. is a 59 y.o. male who presents for Medicare Annual/Subsequent preventive examination.    Review of Systems:   Cardiac Risk Factors include: advanced age (>22mn, >>79women);male gender;dyslipidemia;hypertension     Objective:    Vitals: BP 111/69 (BP Location: Left Arm, Patient Position: Sitting, Cuff Size: Normal)   Pulse (!) 56   Temp (!) 97.5 F (36.4 C) (Oral)   Wt 176 lb 6.4 oz (80 kg)   SpO2 98%   BMI 30.28 kg/m   Body mass index is 30.28 kg/m.  Advanced Directives 09/02/2019 03/06/2019 03/02/2019 10/06/2018 08/19/2018 05/22/2017 12/27/2016  Does Patient Have a Medical Advance Directive? _0  No No  Would patient like information on creating a medical advance directive? No - Patient declined No - Patient declined No - Patient declined - No - Patient declined No - Patient declined -    Tobacco Social History   Tobacco Use  Smoking Status Never Smoker  Smokeless Tobacco Never Used     Counseling given: Not Answered   Clinical Intake:  Pre-visit preparation completed: Yes  Pain : 0-10 Pain Score: 10-Worst pain ever Pain Type: Chronic pain Pain Location: Knee Pain Orientation: Left Pain Descriptors / Indicators: Aching Pain Onset: More than a month ago Pain Frequency: Constant     Nutritional Status: BMI > 30  Obese Nutritional Risks: None Diabetes: No  How often do you need to have someone help you when you read instructions, pamphlets, or other written materials from your doctor or pharmacy?: 1 - Never  Interpreter Needed?: No  Information entered by :: Missie Gehrig,LPN  Past Medical History:  Diagnosis Date  . Arrhythmia   . Asthma   . Cyst of kidney, acquired   . [place] [address]  type 2  . GERD (gastroesophageal reflux disease)   . History of chicken pox   . History of measles as a child   . History of PKU   . Hyperlipidemia   . Hypertension   . IBS  (irritable bowel syndrome)   . Irregular heart beat   . Mentally challenged   . Pancreatitis   . Stroke ([facility] 03/02/2019   Past Surgical History:  Procedure Laterality Date  [facility]  . COLONOSCOPY    . ESOPHAGOGASTRODUODENOSCOPY (EGD) WITH PROPOFOL N/A 12/27/2016   Procedure: ESOPHAGOGASTRODUODENOSCOPY (EGD) WITH PROPOFOL;  Surgeon: VLin Landsman MD;  [facility]  Service: Gastroenterology;  Laterality: N/A;  . HEMORRHOID SURGERY    . Ligament Removal Left    of left thumb: dr. CCleda Mccreedy . ligament removal  of left thumb     Dr. CCleda Mccreedy . [place] GATED MYOCARDIAL STUDY (ARMX HX)  06/23/2014   [place]. Normal   Family History  Problem Relation Age of Onset  . Cancer Mother        throat  . Diabetes Father   . Heart disease Father   . Cancer Maternal Grandmother   . Cancer Maternal Grandfather        pancreatic  . Cancer Paternal Grandfather    Social History   Socioeconomic History  . Marital status: Married    Spouse name: Not on file  . Number of children: 0  . Years of education: Not on file  . Highest education level: 12th grade  Occupational History  . Occupation: Disabled  Tobacco Use  . Smoking status: Never Smoker  . Smokeless tobacco: Never Used  Vaping Use  . Vaping Use: Never used  Substance and Sexual Activity  . Alcohol use: No    Alcohol/week: 0.0 standard drinks  . Drug use: No  . Sexual activity: Not on file  Other Topics Concern  . Not on file  Social History Narrative   ** Merged History Encounter **       ** Data from: 10/22/14 Enc Dept: [facility]       ** Data from: 10/07/14 Enc Dept: [facility]   Arrest: Arrested in 2012 for indecent liberties, required to wear ankle monitor   Social Determinants of Health   Financial Resource Strain: High Risk  . Difficulty of Paying Living Expenses: Hard  Food Insecurity:   . Worried About Paediatric nurse in the Last Year:   . Arboriculturist in the Last Year:   Transportation Needs:   . Film/video editor (Medical):   [name] Lack of Transportation (Non-Medical):   Physical Activity:   . Days of Exercise per Week:   . Minutes of Exercise per Session:   Stress:   . Feeling of Stress :   Social Connections:   . Frequency of Communication with Friends and Family:   . Frequency of Social Gatherings with Friends and Family:   . Attends Religious Services:   . Active Member of Clubs or Organizations:   . Attends Archivist Meetings:   [name] Marital Status:     Outpatient Encounter Medications as of 09/02/2019  Medication Sig  . acetaminophen (TYLENOL) 325 MG tablet Take 1-2 tablets (325-650 mg total) by mouth every 4 (four) hours as needed for mild pain.  [name] albuterol (PROAIR HFA) 108 (90 Base) MCG/ACT inhaler INHALE 2 PUFFS BY MOUTH EVERY 6 HOURS IF NEEDED FOR COUGH OR WHEEZING.  [name] aspirin 81 MG EC tablet Take 1 tablet (81 mg total) by mouth daily.  [name] atorvastatin (LIPITOR) 80 MG tablet Take 1 tablet (80 mg total) by mouth daily.  . Cholecalciferol (VITAMIN D3) 1.25 MG (50000 UT) CAPS Take 50,000 Units by mouth once a week.  . clobetasol cream (TEMOVATE) 0.92 % Apply 1 application topically 2 (two) times daily. Apply to affected area  . Dextran 70-Hypromellose (ARTIFICIAL TEARS) 0.1-0.3 % SOLN Apply 1 drop to eye 3 (three) times daily. To right eye only.  . empagliflozin (JARDIANCE) 25 MG TABS tablet Take 25 mg by mouth daily.  [name] erythromycin ophthalmic ointment Place 1 application into the right eye at bedtime.   . fenofibrate 54 MG tablet Take 1 tablet (54 mg total) by mouth daily.  [name] glucose blood (ONETOUCH ULTRA) test strip To check blood sugar three times daily  . Insulin Pen Needle (PEN NEEDLES) 32G X 4 MM MISC 1 kit by Does not apply route daily.  [name] lisinopril (ZESTRIL) 20 MG tablet Take 20 mg by mouth daily.  . meloxicam (MOBIC) 15 MG tablet TAKE 1 TABLET BY MOUTH  ONCE DAILY  . montelukast (SINGULAIR) 10 MG tablet TAKE 1 TABLET BY MOUTH ONCE EVERY EVENING AT BEDTIME.  [name] neomycin-polymyxin b-dexamethasone (MAXITROL) 3.5-10000-0.1 SUSP Place 1 drop into the right eye.   [name] ofloxacin (OCUFLOX) 0.3 % ophthalmic solution Place 1 drop into the right eye 4 (four) times daily.  [name] omeprazole (PRILOSEC) 20 MG capsule Take 1 capsule (20 mg total) by mouth daily.  . polyethylene glycol (MIRALAX) 17 g packet  Take 17 g by mouth daily.  [name] trimethoprim-polymyxin b (POLYTRIM) ophthalmic solution Place 1 drop into the right eye every 6 (six) hours.   . gabapentin (NEURONTIN) 300 MG capsule Take 1 capsule (300 mg total) by mouth 2 (two) times daily. (Patient not taking: Reported on 09/02/2019)  . insulin degludec (TRESIBA FLEXTOUCH) 100 UNIT/ML FlexTouch Pen Inject 0.1 mLs (10 Units total) into the skin daily.  . nitroGLYCERIN (NITROSTAT) 0.4 MG SL tablet Place 1 tablet (0.4 mg total) under the tongue every 5 (five) minutes as needed for chest pain. (Patient not taking: Reported on 09/02/2019)   No facility-administered encounter medications on file as of 09/02/2019.    Activities of Daily Living In your present state of health, do you have any difficulty performing the following activities: 09/02/2019 03/06/2019  Hearing? N N  Comment no hearing aids -  Vision? N Y  Comment eyeglasses, Dr.Woodard DOUBLE VISION TO RIGHT EYE  Difficulty concentrating or making decisions? N Y  Walking or climbing stairs? Y Y  Comment knee pain -  Dressing or bathing? N Y  Doing errands, shopping? N Y  Conservation officer, nature and eating ? N -  Using the Toilet? N -  In the past six months, have you accidently leaked urine? N -  Do you have problems with loss of bowel control? N -  Managing your Medications? N -  Managing your Finances? N -  Housekeeping or managing your Housekeeping? Y -  Comment has someone that helps with housekeeping -  Some recent data might be hidden    Patient Care  Team: [name], NP as PCP - General (Nurse Practitioner) Anell Barr, [place] (Optometry) Dionisio David, MD as Consulting Physician (Cardiology) De Hollings[place], [facility] as Pharmacist (Pharmacist) Greg Cutter, LCSW as Social Worker (Licensed Clinical Social Worker) Vanita Ingles, RN as Case Manager (General Practice)   Assessment:   This is a routine wellness examination for [place].  Exercise Activities and Dietary recommendations Current Exercise Habits: Home exercise routine, Type of exercise: stretching, Time (Minutes): 10, Frequency (Times/Week): 5, Weekly Exercise (Minutes/Week): 50, Intensity: Mild, Exercise limited by: None identified  Goals Addressed   None     Fall Risk: Fall Risk  09/02/2019 10/06/2018 09/03/2018 08/19/2018 05/22/2017  Falls in the past year? 1 1 0 0 Yes  Number falls in past yr: 0 0 - - 2 or more  Comment - passed out; "I got light headeddd". Wife said due to low blood sugar - - -  Injury with Fall? 0 1 - - No  Comment - bruised head - - -  Risk for fall due to : - History of fall(s) - - -  Follow up - Falls prevention discussed Falls evaluation completed - Falls prevention discussed    FALL RISK PREVENTION PERTAINING TO THE HOME:  Any stairs in or around the home? No  If so, are there any without handrails? No   Home free of loose throw rugs in walkways, pet beds, electrical cords, etc? Yes  Adequate lighting in your home to reduce risk of falls? Yes   ASSISTIVE DEVICES UTILIZED TO PREVENT FALLS:  Life alert? No  Use of a cane, walker or w/c? Yes  walker  Grab bars in the bathroom? No  Shower chair or bench in shower? Yes  Elevated toilet seat or a handicapped toilet? No   TIMED UP AND GO:  Was the test performed? Yes .  Length of time to ambulate  10 feet: 9 sec.   GAIT:  Appearance of gait: Gait steady and fast without the use of an assistive device.  Education: Fall risk prevention has been discussed.  Intervention(s)  required? No  DME/home health order needed?  No   Depression Screen PHQ 2/9 Scores 09/02/2019 10/06/2018 08/19/2018 08/04/2018  PHQ - 2 Score 0 0 0 1  PHQ- 9 Score - - - 3    Cognitive Function     6CIT Screen 05/15/2016  What Year? 0 points  What month? 3 points  What time? 3 points  Count back from 20 0 points  Months in reverse 0 points  Repeat phrase 10 points  Total Score 16    Immunization History  Administered Date(s) Administered  . Hepatitis A, Adult 09/26/2016, 05/22/2017  . Hepatitis B, adult 09/26/2016, 05/22/2017, 11/29/2017  . Influenza,inj,Quad PF,6+ Mos 12/22/2012, 12/21/2013, 11/29/2017, 12/02/2018  . Influenza-Unspecified 11/30/2014, 11/18/2015  . Pneumococcal Polysaccharide-23 12/22/2012, 11/18/2015    Qualifies for Shingles Vaccine? Yes  Zostavax completed n/a. Due for Shingrix. Education has been provided regarding the importance of this vaccine. Pt has been advised to call insurance company to determine out of pocket expense. Advised may also receive vaccine at local pharmacy or Health Dept. Verbalized acceptance and understanding.  Tdap: Although this vaccine is not a covered service during a Wellness Exam, does the patient still wish to receive this vaccine today?  No .  Education has been provided regarding the importance of this vaccine. Advised may receive this vaccine at local pharmacy or Health Dept. Aware to provide a copy of the vaccination record if obtained from local pharmacy or Health Dept. Verbalized acceptance and understanding.  Flu Vaccine: up to date   Pneumococcal Vaccine: up to date, due at age 17   Covid-19 Vaccine:Information provided  Screening Tests Health Maintenance  Topic Date Due  . COVID-19 Vaccine (1) Never done  . OPHTHALMOLOGY EXAM  07/02/2018  . Fecal DNA (Cologuard)  05/30/2019  . TETANUS/TDAP  12/02/2019 (Originally 02/09/1982)  . INFLUENZA VACCINE  10/18/2019  . FOOT EXAM  12/02/2019  . HEMOGLOBIN A1C  01/16/2020    . PNEUMOCOCCAL POLYSACCHARIDE VACCINE AGE 63-64 HIGH RISK  Completed  . Hepatitis C Screening  Completed  . HIV Screening  Completed   Cancer Screenings:  Colorectal Screening: Completed cologuard completed 2018. Repeat every 3 years;due now   Lung Cancer Screening: (Low Dose CT Chest recommended if Age 81-80 years, 30 pack-year currently smoking OR have quit w/in 15years.) does not qualify.    Additional Screening:  Hepatitis C Screening: does qualify; Completed 2017  Vision Screening: Recommended annual ophthalmology exams for early detection of glaucoma and other disorders of the eye. Is the patient up to date with their annual eye exam?  Yes  Who is the provider or what is the name of the office in which the pt attends annual eye exams? Dr.Woodard   Dental Screening: Recommended annual dental exams for proper oral hygiene  Community Resource Referral:  CRR required this visit?  No        Plan:  I have personally reviewed and addressed the Medicare Annual Wellness questionnaire and have noted the following in the patient's chart:  A. Medical and social history B. Use of alcohol, tobacco or illicit drugs  C. Current medications and supplements D. Functional ability and status E.  Nutritional status F.  Physical activity G. Advance directives H. List of other physicians I.  Hospitalizations, surgeries, and ER visits in previous 25  months J.  Vitals K. Screenings such as hearing and vision if needed, cognitive and depression L. Referrals and appointments   In addition, I have reviewed and discussed with patient certain preventive protocols, quality metrics, and best practice recommendations. A written personalized care plan for preventive services as well as general preventive health recommendations were provided to patient.   Signed,   Bevelyn Ngo, LPN  9/83/3825 Nurse Health Advisor   Nurse Notes: none

## 2019-09-02 NOTE — Patient Instructions (Signed)
[name] , Thank you for taking time to come for your Medicare Wellness Visit. I appreciate your ongoing commitment to your health goals. Please review the following plan we discussed and let me know if I can assist you in the future.   Screening recommendations/referrals: Colonoscopy: cologuard due now  Recommended yearly ophthalmology/optometry visit for glaucoma screening and checkup Recommended yearly dental visit for hygiene and checkup  Vaccinations: Influenza vaccine: due 11/2019 Pneumococcal vaccine: due at age 22 Tdap vaccine: due now, check with your insurance for coverage  Shingles vaccine: shingrix eligible    Covid-19: We are recommending the vaccine to everyone who has not had an allergic reaction to any of the components of the vaccine. If you have specific questions about the vaccine, please bring them up with your health care provider to discuss them.   We will likely not be getting the vaccine in the office for the first rounds of vaccinations. The way they are releasing the vaccines is going to be through the health systems (like [facility], [facility], [facility], [facility]), through your county health department, or through the pharmacies.   [facility] Department is giving vaccines to those 65+ and Health Care Workers Teachers and [place] providers start 05/13/19, Essential workers start 3/10 and those with co-morbidities start 06/10/19 Call [phone] to schedule  If you are 65+ you can get a vaccine through [facility] by signing up for an appointment.  You can sign up by going to: [email or web address].  You can get more information by going to: [email or web address]  [employer] next door is giving the [employer]- you can call [phone] or stop by there to schedule.  Advanced directives: Advance directive discussed with you today. Even though you declined this today please call our office should you change your mind and we can give  you the proper paperwork for you to fill out.  Conditions/risks identified: none   Next appointment: Follow up in one year for your annual wellness visit   Preventive Care 40-64 Years, Male Preventive care refers to lifestyle choices and visits with your health care provider that can promote health and wellness. What does preventive care include?  A yearly physical exam. This is also called an annual well check.  Dental exams once or twice a year.  Routine eye exams. Ask your health care provider how often you should have your eyes checked.  Personal lifestyle choices, including:  Daily care of your teeth and gums.  Regular physical activity.  Eating a healthy diet.  Avoiding tobacco and drug use.  Limiting alcohol use.  Practicing safe sex.  Taking low-dose aspirin every day starting at age 60. What happens during an annual well check? The services and screenings done by your health care provider during your annual well check will depend on your age, overall health, lifestyle risk factors, and family history of disease. Counseling  Your health care provider may ask you questions about your:  Alcohol use.  Tobacco use.  Drug use.  Emotional well-being.  Home and relationship well-being.  Sexual activity.  Eating habits.  Work and work Statistician. Screening  You may have the following tests or measurements:  Height, weight, and BMI.  Blood pressure.  Lipid and cholesterol levels. These may be checked every 5 years, or more frequently if you are over 80 years old.  Skin check.  Lung cancer screening. You may have this screening every year starting at age 42 if you have a 30-pack-year history of  smoking and currently smoke or have quit within the past 15 years.  Fecal occult blood test (FOBT) of the stool. You may have this test every year starting at age 24.  Flexible sigmoidoscopy or colonoscopy. You may have a sigmoidoscopy every 5 years or a  colonoscopy every 10 years starting at age 20.  Prostate cancer screening. Recommendations will vary depending on your family history and other risks.  Hepatitis C blood test.  Hepatitis B blood test.  Sexually transmitted disease (STD) testing.  Diabetes screening. This is done by checking your blood sugar (glucose) after you have not eaten for a while (fasting). You may have this done every 1-3 years. Discuss your test results, treatment options, and if necessary, the need for more tests with your health care provider. Vaccines  Your health care provider may recommend certain vaccines, such as:  Influenza vaccine. This is recommended every year.  Tetanus, diphtheria, and acellular pertussis (Tdap, Td) vaccine. You may need a Td booster every 10 years.  Zoster vaccine. You may need this after age 24.  Pneumococcal 13-valent conjugate (PCV13) vaccine. You may need this if you have certain conditions and have not been vaccinated.  Pneumococcal polysaccharide (PPSV23) vaccine. You may need one or two doses if you smoke cigarettes or if you have certain conditions. Talk to your health care provider about which screenings and vaccines you need and how often you need them. This information is not intended to replace advice given to you by your health care provider. Make sure you discuss any questions you have with your health care provider. Document Released: 04/01/2015 Document Revised: 11/23/2015 Document Reviewed: 01/04/2015 Elsevier Interactive Patient Education  2017 [employer] Prevention in the Home Falls can cause injuries. They can happen to people of all ages. There are many things you can do to make your home safe and to help prevent falls. What can I do on the outside of my home?  Regularly fix the edges of walkways and driveways and fix any cracks.  Remove anything that might make you trip as you walk through a door, such as a raised step or threshold.  Trim any  bushes or trees on the path to your home.  Use bright outdoor lighting.  Clear any walking paths of anything that might make someone trip, such as rocks or tools.  Regularly check to see if handrails are loose or broken. Make sure that both sides of any steps have handrails.  Any raised decks and porches should have guardrails on the edges.  Have any leaves, snow, or ice cleared regularly.  Use sand or salt on walking paths during winter.  Clean up any spills in your garage right away. This includes oil or grease spills. What can I do in the bathroom?  Use night lights.  Install grab bars by the toilet and in the tub and shower. Do not use towel bars as grab bars.  Use non-skid mats or decals in the tub or shower.  If you need to sit down in the shower, use a plastic, non-slip stool.  Keep the floor dry. Clean up any water that spills on the floor as soon as it happens.  Remove soap buildup in the tub or shower regularly.  Attach bath mats securely with double-sided non-slip rug tape.  Do not have throw rugs and other things on the floor that can make you trip. What can I do in the bedroom?  Use night lights.  Make sure  that you have a light by your bed that is easy to reach.  Do not use any sheets or blankets that are too big for your bed. They should not hang down onto the floor.  Have a firm chair that has side arms. You can use this for support while you get dressed.  Do not have throw rugs and other things on the floor that can make you trip. What can I do in the kitchen?  Clean up any spills right away.  Avoid walking on wet floors.  Keep items that you use a lot in easy-to-reach places.  If you need to reach something above you, use a strong step stool that has a grab bar.  Keep electrical cords out of the way.  Do not use floor polish or wax that makes floors slippery. If you must use wax, use non-skid floor wax.  Do not have throw rugs and other  things on the floor that can make you trip. What can I do with my stairs?  Do not leave any items on the stairs.  Make sure that there are handrails on both sides of the stairs and use them. Fix handrails that are broken or loose. Make sure that handrails are as long as the stairways.  Check any carpeting to make sure that it is firmly attached to the stairs. Fix any carpet that is loose or worn.  Avoid having throw rugs at the top or bottom of the stairs. If you do have throw rugs, attach them to the floor with carpet tape.  Make sure that you have a light switch at the top of the stairs and the bottom of the stairs. If you do not have them, ask someone to add them for you. What else can I do to help prevent falls?  Wear shoes that:  Do not have high heels.  Have rubber bottoms.  Are comfortable and fit you well.  Are closed at the toe. Do not wear sandals.  If you use a stepladder:  Make sure that it is fully opened. Do not climb a closed stepladder.  Make sure that both sides of the stepladder are locked into place.  Ask someone to hold it for you, if possible.  Clearly mark and make sure that you can see:  Any grab bars or handrails.  First and last steps.  Where the edge of each step is.  Use tools that help you move around (mobility aids) if they are needed. These include:  Canes.  Walkers.  Scooters.  Crutches.  Turn on the lights when you go into a dark area. Replace any light bulbs as soon as they burn out.  Set up your furniture so you have a clear path. Avoid moving your furniture around.  If any of your floors are uneven, fix them.  If there are any pets around you, be aware of where they are.  Review your medicines with your doctor. Some medicines can make you feel dizzy. This can increase your chance of falling. Ask your doctor what other things that you can do to help prevent falls. This information is not intended to replace advice given to  you by your health care provider. Make sure you discuss any questions you have with your health care provider. Document Released: 12/30/2008 Document Revised: 08/11/2015 Document Reviewed: 04/09/2014 Elsevier Interactive Patient Education  2017 [employer].

## 2019-09-02 NOTE — Patient Instructions (Signed)
Asthma, Adult  Asthma is a long-term (chronic) condition in which the airways get tight and narrow. The airways are the breathing passages that lead from the nose and mouth down into the lungs. A person with asthma will have times when symptoms get worse. These are called asthma attacks. They can cause coughing, whistling sounds when you breathe (wheezing), shortness of breath, and chest pain. They can make it hard to breathe. There is no cure for asthma, but medicines and lifestyle changes can help control it. There are many things that can bring on an asthma attack or make asthma symptoms worse (triggers). Common triggers include:  Mold.  Dust.  Cigarette smoke.  Cockroaches.  Things that can cause allergy symptoms (allergens). These include animal skin flakes (dander) and pollen from trees or grass.  Things that pollute the air. These may include household cleaners, wood smoke, smog, or chemical odors.  Cold air, weather changes, and wind.  Crying or laughing hard.  Stress.  Certain medicines or drugs.  Certain foods such as dried fruit, potato chips, and grape juice.  Infections, such as a cold or the flu.  Certain medical conditions or diseases.  Exercise or tiring activities. Asthma may be treated with medicines and by staying away from the things that cause asthma attacks. Types of medicines may include:  Controller medicines. These help prevent asthma symptoms. They are usually taken every day.  Fast-acting reliever or rescue medicines. These quickly relieve asthma symptoms. They are used as needed and provide short-term relief.  Allergy medicines if your attacks are brought on by allergens.  Medicines to help control the body's defense (immune) system. Follow these instructions at home: Avoiding triggers in your home  Change your heating and air conditioning filter often.  Limit your use of fireplaces and wood stoves.  Get rid of pests (such as roaches and  mice) and their droppings.  Throw away plants if you see mold on them.  Clean your floors. Dust regularly. Use cleaning products that do not smell.  Have someone vacuum when you are not home. Use a vacuum cleaner with a HEPA filter if possible.  Replace carpet with wood, tile, or vinyl flooring. Carpet can trap animal skin flakes and dust.  Use allergy-proof pillows, mattress covers, and box spring covers.  Wash bed sheets and blankets every week in hot water. Dry them in a dryer.  Keep your bedroom free of any triggers.  Avoid pets and keep windows closed when things that cause allergy symptoms are in the air.  Use blankets that are made of polyester or cotton.  Clean bathrooms and kitchens with bleach. If possible, have someone repaint the walls in these rooms with mold-resistant paint. Keep out of the rooms that are being cleaned and painted.  Wash your hands often with soap and water. If soap and water are not available, use hand sanitizer.  Do not allow anyone to smoke in your home. General instructions  Take over-the-counter and prescription medicines only as told by your doctor. ? Talk with your doctor if you have questions about how or when to take your medicines. ? Make note if you need to use your medicines more often than usual.  Do not use any products that contain nicotine or tobacco, such as cigarettes and e-cigarettes. If you need help quitting, ask your doctor.  Stay away from secondhand smoke.  Avoid doing things outdoors when allergen counts are high and when air quality is low.  Wear a ski mask  when doing outdoor activities in the winter. The mask should cover your nose and mouth. Exercise indoors on cold days if you can.  Warm up before you exercise. Take time to cool down after exercise.  Use a peak flow meter as told by your doctor. A peak flow meter is a tool that measures how well the lungs are working.  Keep track of the peak flow meter's readings.  Write them down.  Follow your asthma action plan. This is a written plan for taking care of your asthma and treating your attacks.  Make sure you get all the shots (vaccines) that your doctor recommends. Ask your doctor about a flu shot and a pneumonia shot.  Keep all follow-up visits as told by your doctor. This is important. Contact a doctor if:  You have wheezing, shortness of breath, or a cough even while taking medicine to prevent attacks.  The mucus you cough up (sputum) is thicker than usual.  The mucus you cough up changes from clear or white to yellow, green, gray, or bloody.  You have problems from the medicine you are taking, such as: ? A rash. ? Itching. ? Swelling. ? Trouble breathing.  You need reliever medicines more than 2-3 times a week.  Your peak flow reading is still at 50-79% of your personal best after following the action plan for 1 hour.  You have a fever. Get help right away if:  You seem to be worse and are not responding to medicine during an asthma attack.  You are short of breath even at rest.  You get short of breath when doing very little activity.  You have trouble eating, drinking, or talking.  You have chest pain or tightness.  You have a fast heartbeat.  Your lips or fingernails start to turn blue.  You are light-headed or dizzy, or you faint.  Your peak flow is less than 50% of your personal best.  You feel too tired to breathe normally. Summary  Asthma is a long-term (chronic) condition in which the airways get tight and narrow. An asthma attack can make it hard to breathe.  Asthma cannot be cured, but medicines and lifestyle changes can help control it.  Make sure you understand how to avoid triggers and how and when to use your medicines. This information is not intended to replace advice given to you by your health care provider. Make sure you discuss any questions you have with your health care provider. Document Revised:  05/08/2018 Document Reviewed: 04/09/2016 Elsevier Patient Education  2020 [employer].

## 2019-09-02 NOTE — Chronic Care Management (AMB) (Signed)
Chronic Care Management   Follow Up Note   09/02/2019 Name: [name]. MRN: [id number] DOB: [birth year]  Referred by: [name], NP Reason for referral : Chronic Care Management (Medication Management)   [name]. is a 59 y.o. year old male who is a primary care patient of Cannady, Barbaraann Faster, NP. The CCM team was consulted for assistance with chronic disease management and care coordination needs.    Met with patient face to face at PCP visit  Review of patient status, including review of consultants reports, relevant laboratory and other test results, and collaboration with appropriate care team members and the patient's provider was performed as part of comprehensive patient evaluation and provision of chronic care management services.    SDOH (Social Determinants of Health) assessments performed: Yes See Care Plan activities for detailed interventions related to SDOH)  SDOH Interventions     Most Recent Value  SDOH Interventions  Financial Strain Interventions Other (Comment)  [Care Guide referral]       Outpatient Encounter Medications as of 09/02/2019  Medication Sig Note  . acetaminophen (TYLENOL) 325 MG tablet Take 1-2 tablets (325-650 mg total) by mouth every 4 (four) hours as needed for mild pain.   [name] albuterol (PROAIR HFA) 108 (90 Base) MCG/ACT inhaler INHALE 2 PUFFS BY MOUTH EVERY 6 HOURS IF NEEDED FOR COUGH OR WHEEZING.   [name] aspirin 81 MG EC tablet Take 1 tablet (81 mg total) by mouth daily.   [name] atorvastatin (LIPITOR) 80 MG tablet Take 1 tablet (80 mg total) by mouth daily.   . Cholecalciferol (VITAMIN D3) 1.25 MG (50000 UT) CAPS Take 50,000 Units by mouth once a week.   . clobetasol cream (TEMOVATE) 9.39 % Apply 1 application topically 2 (two) times daily. Apply to affected area   . Dextran 70-Hypromellose (ARTIFICIAL TEARS) 0.1-0.3 % SOLN Apply 1 drop to eye 3 (three) times daily. To right eye only.   . empagliflozin (JARDIANCE) 25 MG  TABS tablet Take 25 mg by mouth daily.   [name] erythromycin ophthalmic ointment Place 1 application into the right eye at bedtime.    . fenofibrate 54 MG tablet Take 1 tablet (54 mg total) by mouth daily.   [name] gabapentin (NEURONTIN) 300 MG capsule Take 1 capsule (300 mg total) by mouth 2 (two) times daily. (Patient not taking: Reported on 09/02/2019)   . glucose blood (ONETOUCH ULTRA) test strip To check blood sugar three times daily   . insulin degludec (TRESIBA FLEXTOUCH) 100 UNIT/ML FlexTouch Pen Inject 0.1 mLs (10 Units total) into the skin daily. 08/14/2019: 15 units QPM  . Insulin Pen Needle (PEN NEEDLES) 32G X 4 MM MISC 1 kit by Does not apply route daily.   [name] lisinopril (ZESTRIL) 20 MG tablet Take 20 mg by mouth daily.   . meloxicam (MOBIC) 15 MG tablet TAKE 1 TABLET BY MOUTH ONCE DAILY   . montelukast (SINGULAIR) 10 MG tablet TAKE 1 TABLET BY MOUTH ONCE EVERY EVENING AT BEDTIME.   [name] neomycin-polymyxin b-dexamethasone (MAXITROL) 3.5-10000-0.1 SUSP Place 1 drop into the right eye.    . nitroGLYCERIN (NITROSTAT) 0.4 MG SL tablet Place 1 tablet (0.4 mg total) under the tongue every 5 (five) minutes as needed for chest pain.   [name] ofloxacin (OCUFLOX) 0.3 % ophthalmic solution Place 1 drop into the right eye 4 (four) times daily.   [name] omeprazole (PRILOSEC) 20 MG capsule Take 1 capsule (20 mg total) by mouth daily.   . polyethylene  glycol (MIRALAX) 17 g packet Take 17 g by mouth daily.   [name] trimethoprim-polymyxin b (POLYTRIM) ophthalmic solution Place 1 drop into the right eye every 6 (six) hours.     No facility-administered encounter medications on file as of 09/02/2019.     Objective:   Goals Addressed              This Visit's Progress     Patient Stated   .  PharmD "I want to work on my diabetes" (pt-stated)        [place] (see longtitudinal plan of care for additional care plan information)  Current Barriers:  . 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  . Current antihyperglycemic regimen: Jardiance 25 mg daily, Tresiba 18 units daily  o Metformin d/c d/t bump in Scr after restarted o Avoiding sulfonylurea d/t sulfa allergy  . Current blood glucose readings: o Unable to verbalize today, his wife checks and helps document . Cardiovascular risk reduction (follows w/ Dr. Laurelyn Sickle) o Current hypertensive regimen: lisinopril 10 mg daily  o Current hyperlipidemia regimen: atorvastatin 80 mg; fenofibrate 48 mg daily o Current antiplatelet regimen: ASA 81 mg daily;  . Chronic kidney disease: follows w/ Kolluru, last visit 07/24/19 . S/p CVA: follows w/ Dr. Melrose Nakayama . Allergies/asthma: albuterol PRN wheezing/SOB; loratidine 10 mg daily and montelukast 10 mg daily. Spirometry today . Health maintenance: Vitamin D 50,000 IU weekly Q Monday  Pharmacist Clinical Goal(s):  [name] Over the next 90 days, patient with work with PharmD and primary care provider to address optimized medication management  Interventions: . Comprehensive medication review performed, medication list updated in electronic medical record . Inter-disciplinary care team collaboration (see longitudinal plan of care) . Reviewed PFT. Starting ICS/LABA today, chose Breo d/t once daily use. Encouraged patient to ask [facility] drug to counsel on administration. . Will collaborate w/ CCM team and Care Guide regarding copay concerns w/ orthopedic provider and neurology provider.  [name] patient's wife, [name], to review BG readings, review new medication. LVM for [name] to return my call. RN CM f/u planned next week  Patient Self Care Activities:  . Patient will check blood glucose daily , document, and provide at future appointments . Patient will take medications as prescribed . Patient will report any questions or concerns to provider     Please see past updates related to this goal by clicking on the "Past Updates" button in the selected goal          Plan:  - Will outreach as previously scheduled  Catie Darnelle Maffucci, PharmD, [place] [phone]

## 2019-09-02 NOTE — Assessment & Plan Note (Signed)
Chronic, ongoing.  A1C recently  >14% and fasting sugars at this time above goal.  Has been making poor diet choices, but wife reports he has gotten rid of sodas.  Continue Jardiance and increase Tresiba 18 units QHS, if fasting BS remain >130 next week then increase to 23 units.  Poor tolerance to Metformin in past and allergic to Sulfa.  Educated patient at length on effect of diabetes from head to toe and increased risk for recurrent CVA due to poor control.  Recommend they check his BS TID.  Consider initiation of GLP and transition off insulin if A1C improves (may need wife to assist him with this due to his learning disabilities).  Goal A1C <6.5%.  Continue collaboration with CCM team.  Return in 4 weeks.

## 2019-09-02 NOTE — Assessment & Plan Note (Signed)
Chronic, ongoing.  FEV1 70% today.  Will add on Breo to regimen to use daily and CCM PharmD discussed with him use.  Recommend using Albuterol as needed if episodes of SOB present.  Return in 4 weeks.

## 2019-09-02 NOTE — Assessment & Plan Note (Signed)
To multiple areas, at present left knee is issue.  Missed visit with ortho last year and reports unable to afford visit at this time, will work with CCM team on this.  Has cyst-like area to lateral aspect of left knee which would benefit assessment by ortho and recommendations.  Recommend continued use of Meloxicam as needed only at home, use minimally as possible. Use of knee sleeve for support when walking.  Recommend Voltaren gel, Icy/Hot Lidocaine patches, and alternate heat and ice as needed.  Return in 4 weeks.

## 2019-09-02 NOTE — Chronic Care Management (AMB) (Signed)
Chronic Care Management   Follow Up Note   09/02/2019 Name: [name]. MRN: [id number] DOB: 08/21/1960  Referred by: Alex Lick, NP Reason for referral : Chronic Care Management (Medication Management)   [name]. is a 59 y.o. year old male who is a primary care patient of [name], Alex Faster, NP. The CCM team was consulted for assistance with chronic disease management and care coordination needs.    Patient's wife, [name], called me back.   Review of patient status, including review of consultants reports, relevant laboratory and other test results, and collaboration with appropriate care team members and the patient's provider was performed as part of comprehensive patient evaluation and provision of chronic care management services.    SDOH (Social Determinants of Health) assessments performed: Yes See Care Plan activities for detailed interventions related to SDOH)  SDOH Interventions     Most Recent Value  SDOH Interventions  Financial Strain Interventions Other (Comment)  [Care Guide referral]       Outpatient Encounter Medications as of 09/02/2019  Medication Sig Note  . acetaminophen (TYLENOL) 325 MG tablet Take 1-2 tablets (325-650 mg total) by mouth every 4 (four) hours as needed for mild pain.   [name] albuterol (PROAIR HFA) 108 (90 Base) MCG/ACT inhaler INHALE 2 PUFFS BY MOUTH EVERY 6 HOURS IF NEEDED FOR COUGH OR WHEEZING.   [name] aspirin 81 MG EC tablet Take 1 tablet (81 mg total) by mouth daily.   [name] atorvastatin (LIPITOR) 80 MG tablet Take 1 tablet (80 mg total) by mouth daily.   . Cholecalciferol (VITAMIN D3) 1.25 MG (50000 UT) CAPS Take 50,000 Units by mouth once a week.   . clobetasol cream (TEMOVATE) 8.29 % Apply 1 application topically 2 (two) times daily. Apply to affected area   . Dextran 70-Hypromellose (ARTIFICIAL TEARS) 0.1-0.3 % SOLN Apply 1 drop to eye 3 (three) times daily. To right eye only.   . empagliflozin (JARDIANCE) 25 MG TABS  tablet Take 25 mg by mouth daily.   [name] erythromycin ophthalmic ointment Place 1 application into the right eye at bedtime.    . fenofibrate 54 MG tablet Take 1 tablet (54 mg total) by mouth daily.   [name] gabapentin (NEURONTIN) 300 MG capsule Take 1 capsule (300 mg total) by mouth 2 (two) times daily. (Patient not taking: Reported on 09/02/2019)   . glucose blood (ONETOUCH ULTRA) test strip To check blood sugar three times daily   . insulin degludec (TRESIBA FLEXTOUCH) 100 UNIT/ML FlexTouch Pen Inject 0.1 mLs (10 Units total) into the skin daily. 08/14/2019: 15 units QPM  . Insulin Pen Needle (PEN NEEDLES) 32G X 4 MM MISC 1 kit by Does not apply route daily.   [name] lisinopril (ZESTRIL) 20 MG tablet Take 20 mg by mouth daily.   . meloxicam (MOBIC) 15 MG tablet TAKE 1 TABLET BY MOUTH ONCE DAILY   . montelukast (SINGULAIR) 10 MG tablet TAKE 1 TABLET BY MOUTH ONCE EVERY EVENING AT BEDTIME.   [name] neomycin-polymyxin b-dexamethasone (MAXITROL) 3.5-10000-0.1 SUSP Place 1 drop into the right eye.    . nitroGLYCERIN (NITROSTAT) 0.4 MG SL tablet Place 1 tablet (0.4 mg total) under the tongue every 5 (five) minutes as needed for chest pain.   [name] ofloxacin (OCUFLOX) 0.3 % ophthalmic solution Place 1 drop into the right eye 4 (four) times daily.   [name] omeprazole (PRILOSEC) 20 MG capsule Take 1 capsule (20 mg total) by mouth daily.   . polyethylene glycol (MIRALAX)  17 g packet Take 17 g by mouth daily.   [name] trimethoprim-polymyxin b (POLYTRIM) ophthalmic solution Place 1 drop into the right eye every 6 (six) hours.     No facility-administered encounter medications on file as of 09/02/2019.     Objective:   Goals Addressed              This Visit's Progress     Patient Stated   .  PharmD "I want to work on my diabetes" (pt-stated)        [name] (see longtitudinal plan of care for additional care plan information)  Current Barriers:  . 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  . Current antihyperglycemic regimen: Jardiance 25 mg daily, Tresiba 18 units daily  o Metformin d/c d/t bump in Scr after restarted o Avoiding sulfonylurea d/t sulfa allergy  . Current blood glucose readings: o Addendum: Wife calls back. Reports random readings 160-270s, [name] unsure if these are fastings or after meals. Reports some overnight snacking. She reports that she threw out breads, snack foods, sweet foods, and tried to purchase more fruits.  . Cardiovascular risk reduction (follows w/ [name]) o Current hypertensive regimen: lisinopril 10 mg daily  o Current hyperlipidemia regimen: atorvastatin 80 mg; fenofibrate 48 mg daily o Current antiplatelet regimen: ASA 81 mg daily;  . Chronic kidney disease: follows w/ [name], last visit 07/24/19 . S/p CVA: follows w/ [name] . Allergies/asthma: albuterol PRN wheezing/SOB; loratidine 10 mg daily and montelukast 10 mg daily. Spirometry today . Health maintenance: Vitamin D 50,000 IU weekly Q Monday  Pharmacist Clinical Goal(s):  [name] Over the next 90 days, patient with work with PharmD and primary care provider to address optimized medication management  Interventions: . Comprehensive medication review performed, medication list updated in electronic medical record . Inter-disciplinary care team collaboration (see longitudinal plan of care) . Reviewed PFT. Starting ICS/LABA today, chose Breo d/t once daily use. Encouraged patient to ask [facility] to counsel on administration. . Will collaborate w/ CCM team and Care Guide regarding copay concerns w/ orthopedic provider and neurology provider.  [name] patient's wife, [name], to review BG readings, review new medication. LVM for [name] to return my call. RN CM f/u planned next week . Addendum: Recommend patient increase Tresiba to 22  units daily. Will continue to support patient w/ CCM team to brainstorm ways to adjust snacking habits when wife isn't around. . Encouraged [name] to call [name], Care Guide, back about financial resources and her request for a wheel chair lift on the back of their car.  Lorrene Reid [name] on use of Breo  Patient Self Care Activities:  . Patient will check blood glucose daily , document, and provide at future appointments . Patient will take medications as prescribed . Patient will report any questions or concerns to provider   Please see past updates related to this goal by clicking on the "Past Updates" button in the selected goal          Plan:  - Will f/u as previously scheduled  Catie Darnelle Maffucci, PharmD, [place] [phone]

## 2019-09-02 NOTE — Patient Instructions (Addendum)
[name],   You need to see the orthopedic doctor. If cost is a concern, we can get our [place], to help figure out some options. Call her back please - [phone].   Start this new inhaler, Breo. Take it once a day, EVERY MORNING. Please ask the pharmacist at the pharmacy to show you how to use this. You can use your albuterol inhaler for any breakthrough symptoms, like shortness of breath or coughing.   Please ask [name] to call me about how your blood sugars have been running.   Thanks!  [name], PharmD [phone]  Visit Information  Goals Addressed              This Visit's Progress     Patient Stated   .  PharmD "I want to work on my diabetes" (pt-stated)        [place] (see longtitudinal plan of care for additional care plan information)  Current Barriers:  . 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  . Current antihyperglycemic regimen: Jardiance 25 mg daily, Tresiba 18 units daily  o Metformin d/c d/t bump in Scr after restarted o Avoiding sulfonylurea d/t sulfa allergy  . Current blood glucose readings: o Unable to verbalize today, his wife checks and helps document . Cardiovascular risk reduction (follows w/ [name]) o Current hypertensive regimen: lisinopril 10 mg daily  o Current hyperlipidemia regimen: atorvastatin 80 mg; fenofibrate 48 mg daily o Current antiplatelet regimen: ASA 81 mg daily;  . Chronic kidney disease: follows w/ [name], last visit 07/24/19 . S/p CVA: follows w/ [name] . Allergies/asthma: albuterol PRN wheezing/SOB; loratidine 10 mg daily and montelukast 10 mg daily. Spirometry today . Health maintenance: Vitamin D 50,000 IU weekly Q Monday  Pharmacist Clinical Goal(s):  [name] Over the next 90 days, patient with work with PharmD and  primary care provider to address optimized medication management  Interventions: . Comprehensive medication review performed, medication list updated in electronic medical record . Inter-disciplinary care team collaboration (see longitudinal plan of care) . Reviewed PFT. Starting ICS/LABA today, chose Breo d/t once daily use. Encouraged patient to ask [facility] to counsel on administration. . Will collaborate w/ CCM team and Care Guide regarding copay concerns w/ orthopedic provider and neurology provider.  [name] patient's wife, [name], to review BG readings, review new medication. LVM for [name] to return my call. RN CM f/u planned next week  Patient Self Care Activities:  . Patient will check blood glucose daily , document, and provide at future appointments . Patient will take medications as prescribed . Patient will report any questions or concerns to provider   Please see past updates related to this goal by clicking on the "Past Updates" button in the selected goal         The patient verbalized understanding of instructions provided today and declined a print copy of patient instruction materials.   Plan:  - Will outreach as previously scheduled  [name], PharmD, [place] [phone]

## 2019-09-02 NOTE — Progress Notes (Signed)
BP 111/69   Pulse (!) 56   Temp (!) 97.5 F (36.4 C) (Oral)   Wt 176 lb 6.4 oz (80 kg)   SpO2 98%   BMI 30.28 kg/m    Subjective:    Patient ID: [name]., male    DOB: June 22, 1960, 59 y.o.   MRN: [id number]  HPI: [name]. is a 59 y.o. male  Chief Complaint  Patient presents with  . Diabetes   DIABETES Recent A1C on 07/17/19 was >14% and [place] increased last visit to 15 units on 07/29/19, again increased to 18 units on 08/14/19.  He continues on Jardiance 25 MG along with [place].  His wife, [name], is in charge of medications at home and she is not present today.  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  Has seen dietician.  He reports he has stopped drinking sodas.   Hypoglycemic episodes:no Polydipsia/polyuria: yes Visual disturbance: no Chest pain: no Paresthesias: no Glucose Monitoring: yes             Accucheck frequency: BID             Fasting glucose: did not bring book today -- reports 100 to 180             Post prandial: always go up after he eats             Evening:             Before meals: Taking Insulin?: yes             Long acting insulin:             Short acting insulin: Blood Pressure Monitoring: not checking Retinal Examination: Not up to Date Foot Exam: Up to Date Pneumovax: Up to Date Influenza: Up to Date Aspirin: yes   COPD Has Albuterol uses as needed, did previously take Flovent.  Had ultrasound in August 2016 -- noted ectatic abdominal aorta is to repeat in 5 years (August 2021).  Never a smoker.  FEV1 70% today. COPD status: stable Satisfied with current treatment?: yes Oxygen use: no Dyspnea frequency: with mowing lawn Cough frequency: three times a day Rescue inhaler frequency:  twice a day Limitation of activity: no Productive cough: none Last Spirometry: July 2016 Pneumovax: Up to Date Influenza: Up to  Date  KNEE PAIN (LEFT) Had imaging August 2020 noting mild degenerative changes without acute osseous.  Knee pain has been ongoing and patient very hyper focused on this.  Had ortho referral 10/31/2018, but never attended visit.  A new referral was placed on 07/29/19, but he reports they need money up front and he has not gone -- states he wants cyst from knee removed, discussed with him PCP does not perform this type of removal.  He reports has cyst to left knee for a good while and pain is present when he bends down, hard to get back.  Does take Meloxicam as needed.   Duration: chronic Involved knee: left Mechanism of injury: unknown Location:diffuse Onset: gradual Severity: 10/10  Quality:  dull, aching and throbbing Frequency: intermittent Radiation: no Aggravating factors: weight bearing, walking, running, bending and movement  Alleviating factors: Meloxicam, ice and rest  Status: fluctuating Treatments attempted: Meloxicam and rest  Relief with NSAIDs?:  mild Weakness with weight bearing or walking: no Sensation of giving way: yes Locking: no Popping: no Bruising:  no Swelling: no Redness: no Paresthesias/decreased sensation: no Fevers: no  Relevant past medical, surgical, family and social history reviewed and updated as indicated. Interim medical history since our last visit reviewed. Allergies and medications reviewed and updated.  Review of Systems  Constitutional: Negative for activity change, diaphoresis, fatigue and fever.  Respiratory: Negative for cough, chest tightness, shortness of breath and wheezing.   Cardiovascular: Negative for chest pain, palpitations and leg swelling.  Gastrointestinal: Negative.   Endocrine: Negative for cold intolerance, heat intolerance, polydipsia, polyphagia and polyuria.  Musculoskeletal: Positive for arthralgias.  Neurological: Negative.   Psychiatric/Behavioral: Negative.     Per HPI unless specifically indicated above      Objective:    BP 111/69   Pulse (!) 56   Temp (!) 97.5 F (36.4 C) (Oral)   Wt 176 lb 6.4 oz (80 kg)   SpO2 98%   BMI 30.28 kg/m   Wt Readings from Last 3 Encounters:  09/02/19 176 lb 6.4 oz (80 kg)  09/02/19 176 lb 6.4 oz (80 kg)  07/29/19 177 lb (80.3 kg)    Physical Exam Vitals and nursing note reviewed.  Constitutional:      General: He is awake. He is not in acute distress.    Appearance: He is well-developed. He is obese. He is not ill-appearing.  HENT:     Head: Normocephalic and atraumatic.     Right Ear: Hearing normal. No drainage.     Left Ear: Hearing normal. No drainage.  Eyes:     General: Lids are normal.        Right eye: No discharge.        Left eye: No discharge.     Conjunctiva/sclera: Conjunctivae normal.     Pupils: Pupils are equal, round, and reactive to light.  Neck:     Thyroid: No thyromegaly.     Vascular: No carotid bruit.  Cardiovascular:     Rate and Rhythm: Normal rate and regular rhythm.     Heart sounds: Normal heart sounds, S1 normal and S2 normal. No murmur heard.  No gallop.   Pulmonary:     Effort: Pulmonary effort is normal. No accessory muscle usage or respiratory distress.     Breath sounds: Normal breath sounds.  Abdominal:     General: Bowel sounds are normal.     Palpations: Abdomen is soft. There is no hepatomegaly or splenomegaly.  Musculoskeletal:        General: Normal range of motion.     Cervical back: Normal range of motion and neck supple.     Right knee: Normal.     Left knee: No swelling, erythema, ecchymosis, lacerations or crepitus. Normal range of motion. No tenderness.     Right lower leg: No edema.     Left lower leg: No edema.       Legs:  Skin:    General: Skin is warm and dry.     Capillary Refill: Capillary refill takes less than 2 seconds.  Neurological:     Mental Status: He is alert and oriented to person, place, and time.     Deep Tendon Reflexes: Reflexes are normal and symmetric.   Psychiatric:        Attention and Perception: Attention normal.        Mood and Affect: Mood normal.        Speech: Speech normal.        Behavior: Behavior normal. Behavior is cooperative.     Results  for orders placed or performed in visit on 07/29/19  PSA  Result Value Ref Range   Prostate Specific Ag, Serum 0.3 0.0 - 4.0 ng/mL      Assessment & Plan:   Problem List Items Addressed This Visit      Respiratory   Asthma    Chronic, ongoing.  FEV1 70% today.  Will add on Breo to regimen to use daily and CCM PharmD discussed with him use.  Recommend using Albuterol as needed if episodes of SOB present.  Return in 4 weeks.      Relevant Medications   albuterol (PROAIR HFA) 108 (90 Base) MCG/ACT inhaler   fluticasone furoate-vilanterol (BREO ELLIPTA) 100-25 MCG/INH AEPB   Other Relevant Orders   Spirometry with graph (Completed)     Endocrine   Uncontrolled type 2 diabetes mellitus with hyperglycemia (HCC) - Primary    Chronic, ongoing.  A1C recently  >14% and fasting sugars at this time above goal.  Has been making poor diet choices, but wife reports he has gotten rid of sodas.  Continue Jardiance and increase Tresiba 18 units QHS, if fasting BS remain >130 next week then increase to 23 units.  Poor tolerance to Metformin in past and allergic to Sulfa.  Educated patient at length on effect of diabetes from head to toe and increased risk for recurrent CVA due to poor control.  Recommend they check his BS TID.  Consider initiation of GLP and transition off insulin if A1C improves (may need wife to assist him with this due to his learning disabilities).  Goal A1C <6.5%.  Continue collaboration with CCM team.  Return in 4 weeks.        Musculoskeletal and Integument   Arthritis    To multiple areas, at present left knee is issue.  Missed visit with ortho last year and reports unable to afford visit at this time, will work with CCM team on this.  Has cyst-like area to lateral aspect  of left knee which would benefit assessment by ortho and recommendations.  Recommend continued use of Meloxicam as needed only at home, use minimally as possible. Use of knee sleeve for support when walking.  Recommend Voltaren gel, Icy/Hot Lidocaine patches, and alternate heat and ice as needed.  Return in 4 weeks.         Time: 25+ minutes, >50% spent counseling/or care coordination due to learning disabilities  Follow up plan: Return in about 4 weeks (around 09/30/2019) for T2DM, HTN/HLD, Asthma -- 40 minute slot.

## 2019-09-03 ENCOUNTER — Telehealth: Payer: Self-pay

## 2019-09-03 NOTE — Telephone Encounter (Signed)
Copied from [place] [id number]. Topic: Referral - Status >> Sep 03, 2019 25:89 AM [name] wrote: [birth year] Spoke with patient's wife Valton Schwartz, patient's Medicaid caseworker Charolotte Eke is assisting him with filing.  Also spoke with [name] about [facility] [place] [facility] [place].  [name] [phone]

## 2019-09-07 ENCOUNTER — Telehealth: Payer: Self-pay | Admitting: Nurse Practitioner

## 2019-09-07 ENCOUNTER — Other Ambulatory Visit: Payer: Self-pay | Admitting: Nurse Practitioner

## 2019-09-07 MED ORDER — ONETOUCH ULTRASOFT LANCETS MISC: 12 refills | Status: DC

## 2019-09-07 MED ORDER — ONETOUCH ULTRA VI STRP: ORAL_STRIP | 3 refills | Status: DC

## 2019-09-07 MED ORDER — TRESIBA FLEXTOUCH 100 UNIT/ML ~~LOC~~ SOPN: 22.0000 [IU] | PEN_INJECTOR | Freq: Every day | SUBCUTANEOUS | 4 refills | Status: DC

## 2019-09-07 MED ORDER — PEN NEEDLES 32G X 4 MM MISC: 1.0000 | Freq: Every day | 4 refills | Status: DC

## 2019-09-07 NOTE — Telephone Encounter (Signed)
Is there anything to tell the patient's wife? RX for needles were sent in earlier today.

## 2019-09-07 NOTE — Telephone Encounter (Signed)
Call to patient's wife- left message on VM to call back. Wanted to inquire how many needles he is using daily vs. # given in Rx to see if that needs to be adjusted.

## 2019-09-07 NOTE — Telephone Encounter (Signed)
Patient's wife notified of Jolene's message.

## 2019-09-07 NOTE — Telephone Encounter (Signed)
Caller name: Mrs. [name]  Relation to pt: spouse  Call back number: [phone]  Pharmacy:  [facility], [place]. Phone:  [phone]  Fax:  [phone]        Reason for call:  Pharmacy states patient can refill Insulin Pen Needle (PEN NEEDLES) 32G X 4 MM MISC until 6/25 due to insurance stating patient is not due. Spouse states she has been following the directions but is out. Spouse states she will continue to monitor patient sugar levels but unsure what to do if he needs insulin. Please advise

## 2019-09-07 NOTE — Telephone Encounter (Signed)
I fixed it so refills all sent today for all of these, that way maybe they land on same day.  I also sent refills on insulin and changed dose to 22 units, so hopefully they will fill sooner than 25th due to dose change.

## 2019-09-07 NOTE — Telephone Encounter (Signed)
Wife returned call: States pt has been out of insulin since Thursday as dose was increased (22u).  States was told by pharmacy could not be refilled until 09/11/2019 per insurance. Also states he is almost out of needles. Questioning why strips, needles and insulin are all sent at different times. Reports pt BS this am 140 "Which is good for him, we've been watching our diet."  Please advise: [phone]

## 2019-09-08 ENCOUNTER — Telehealth: Payer: Self-pay

## 2019-09-08 NOTE — Telephone Encounter (Signed)
Copied from [place] [id number]. Topic: Referral - Status >> Sep 08, 2019  7:32 PM [name] wrote: [birth year] Spoke with patient's wife she has not been feeling well so has not called any of the resources,  she has also had a death in the family,  plans to call within the next few days.  Will follow-up with her by 09/14/19. [name] [phone]

## 2019-09-09 ENCOUNTER — Telehealth: Payer: Self-pay

## 2019-09-14 ENCOUNTER — Telehealth: Payer: Self-pay

## 2019-09-14 NOTE — Telephone Encounter (Signed)
Copied from [place] [id number]. Topic: Referral - Status >> Sep 14, 2019 61:44 PM [name] wrote: [birth year]38 Left message for patient's wife to return my call regarding home repair.  [name] [phone]

## 2019-09-22 ENCOUNTER — Other Ambulatory Visit: Payer: Self-pay | Admitting: Nurse Practitioner

## 2019-09-22 DIAGNOSIS — R062 Wheezing: Secondary | ICD-10-CM

## 2019-09-22 MED ORDER — MONTELUKAST SODIUM 10 MG PO TABS: ORAL_TABLET | ORAL | 11 refills | Status: DC

## 2019-09-22 MED ORDER — FENOFIBRATE 54 MG PO TABS: 54.0000 mg | ORAL_TABLET | Freq: Every day | ORAL | 3 refills | Status: DC

## 2019-09-22 MED ORDER — LISINOPRIL 20 MG PO TABS: 20.0000 mg | ORAL_TABLET | Freq: Every day | ORAL | 4 refills | Status: DC

## 2019-09-22 MED ORDER — ATORVASTATIN CALCIUM 80 MG PO TABS: 80.0000 mg | ORAL_TABLET | Freq: Every day | ORAL | 3 refills | Status: DC

## 2019-09-22 MED ORDER — EMPAGLIFLOZIN 25 MG PO TABS: 25.0000 mg | ORAL_TABLET | Freq: Every day | ORAL | 5 refills | Status: DC

## 2019-09-22 MED ORDER — OMEPRAZOLE 20 MG PO CPDR: 20.0000 mg | DELAYED_RELEASE_CAPSULE | Freq: Every day | ORAL | 12 refills | Status: DC

## 2019-09-22 MED ORDER — GABAPENTIN 300 MG PO CAPS: 300.0000 mg | ORAL_CAPSULE | Freq: Two times a day (BID) | ORAL | 1 refills | Status: DC

## 2019-09-22 NOTE — Telephone Encounter (Signed)
Requested medication (s) are due for refill today: no  Requested medication (s) are on the active medication list: no    Future visit scheduled:yes  Notes to clinic: medication refilled by different provider Some meds are not on list Please review for refill   Requested Prescriptions  Pending Prescriptions Disp Refills   atorvastatin (LIPITOR) 80 MG tablet 90 tablet 3    Sig: Take 1 tablet (80 mg total) by mouth daily.      Cardiovascular:  Antilipid - Statins Failed - 09/22/2019  1:32 PM      Failed - HDL in normal range and within 360 days    HDL  Date Value Ref Range Status  07/17/2019 35 (L) >39 mg/dL Final          Failed - Triglycerides in normal range and within 360 days    Triglycerides  Date Value Ref Range Status  07/17/2019 240 (H) 0 - 149 mg/dL Final   Triglycerides Piccolo,Waived  Date Value Ref Range Status  12/02/2018 145 <150 mg/dL Final    Comment:                            Normal                   <150                         Borderline High     150 - 199                         High                200 - 499                         Very High                >499           Passed - Total Cholesterol in normal range and within 360 days    Cholesterol, Total  Date Value Ref Range Status  07/17/2019 160 100 - 199 mg/dL Final   Cholesterol Piccolo, Waived  Date Value Ref Range Status  12/02/2018 136 <200 mg/dL Final    Comment:                            Desirable                <200                         Borderline High      200- 239                         High                     >239           Passed - LDL in normal range and within 360 days    LDL Chol Calc (NIH)  Date Value Ref Range Status  07/17/2019 85 0 - 99 mg/dL Final          Passed - Patient is not pregnant      Passed - Valid encounter within last 12  months    Recent Outpatient Visits           2 weeks ago Uncontrolled type 2 diabetes mellitus with hyperglycemia  ([place])   [place], [place] T, NP   1 month ago Uncontrolled type 2 diabetes mellitus with hyperglycemia ([place])   [place], Ogden T, NP   2 months ago Uncontrolled type 2 diabetes mellitus with hyperglycemia ([place])   [place], Jolene T, NP   6 months ago Brainstem infarct, acute ([place])   [place], Jolene T, NP   7 months ago Controlled type 2 diabetes mellitus without complication, without long-term current use of insulin ([place])   [place] Kathrine Haddock, NP       Future Appointments             In 1 week Cannady, Barbaraann Faster, NP [facility]              empagliflozin (JARDIANCE) 25 MG TABS tablet 30 tablet 5    Sig: Take 1 tablet (25 mg total) by mouth daily.      Endocrinology:  Diabetes - SGLT2 Inhibitors Failed - 09/22/2019  1:32 PM      Failed - HBA1C is between 0 and 7.9 and within 180 days    HB A1C (BAYER DCA - WAIVED)  Date Value Ref Range Status  07/17/2019 >14.0 (H) <7.0 % Final    Comment:                                          Diabetic Adult            <7.0                                       Healthy Adult        4.3 - 5.7                                                           (DCCT/NGSP) American Diabetes Association's Summary of Glycemic Recommendations for Adults with Diabetes: Hemoglobin A1c <7.0%. More stringent glycemic goals (A1c <6.0%) may further reduce complications at the cost of increased risk of hypoglycemia.           Passed - Cr in normal range and within 360 days    Creatinine, Ser  Date Value Ref Range Status  07/17/2019 1.20 0.76 - 1.27 mg/dL Final   Creatinine, POC  Date Value Ref Range Status  08/22/2015 n/a mg/dL Final          Passed - LDL in normal range and within 360 days    LDL Chol Calc (NIH)  Date Value Ref Range Status  07/17/2019 85 0 - 99 mg/dL Final          Passed - eGFR in normal range  and within 360 days    GFR calc Af Amer  Date Value Ref Range Status  07/17/2019 78 >59 mL/min/1.73 Final    Comment:    **[facility] currently reports eGFR in compliance with the current**  recommendations of the [employer]. [facility] will   update reporting as new guidelines are published from the NKF-ASN   Task force.    GFR calc non Af Amer  Date Value Ref Range Status  07/17/2019 67 >59 mL/min/1.73 Final          Passed - Valid encounter within last 6 months    Recent Outpatient Visits           2 weeks ago Uncontrolled type 2 diabetes mellitus with hyperglycemia ([place])   [place], Jolene T, NP   1 month ago Uncontrolled type 2 diabetes mellitus with hyperglycemia ([place])   [place], Jolene T, NP   2 months ago Uncontrolled type 2 diabetes mellitus with hyperglycemia ([place])   [place], Jolene T, NP   6 months ago Brainstem infarct, acute ([place])   [place], Jolene T, NP   7 months ago Controlled type 2 diabetes mellitus without complication, without long-term current use of insulin ([place])   [place], Malachy Mood, NP       Future Appointments             In 1 week Cannady, Barbaraann Faster, NP [facility]              fenofibrate 54 MG tablet 90 tablet 3    Sig: Take 1 tablet (54 mg total) by mouth daily.      Cardiovascular:  Antilipid - Fibric Acid Derivatives Failed - 09/22/2019  1:32 PM      Failed - HDL in normal range and within 360 days    HDL  Date Value Ref Range Status  07/17/2019 35 (L) >39 mg/dL Final          Failed - Triglycerides in normal range and within 360 days    Triglycerides  Date Value Ref Range Status  07/17/2019 240 (H) 0 - 149 mg/dL Final   Triglycerides Piccolo,Waived  Date Value Ref Range Status  12/02/2018 145 <150 mg/dL Final    Comment:                            Normal                    <150                         Borderline High     150 - 199                         High                200 - 499                         Very High                >499           Failed - ALT in normal range and within 180 days    ALT  Date Value Ref Range Status  03/25/2019 31 0 - 44 IU/L Final          Failed - AST in normal range and within 180 days    AST  Date Value Ref Range Status  03/25/2019 26 0 - 40 IU/L Final  Passed - Total Cholesterol in normal range and within 360 days    Cholesterol, Total  Date Value Ref Range Status  07/17/2019 160 100 - 199 mg/dL Final   Cholesterol Piccolo, Waived  Date Value Ref Range Status  12/02/2018 136 <200 mg/dL Final    Comment:                            Desirable                <200                         Borderline High      200- 239                         High                     >239           Passed - LDL in normal range and within 360 days    LDL Chol Calc (NIH)  Date Value Ref Range Status  07/17/2019 85 0 - 99 mg/dL Final          Passed - Cr in normal range and within 180 days    Creatinine, Ser  Date Value Ref Range Status  07/17/2019 1.20 0.76 - 1.27 mg/dL Final   Creatinine, POC  Date Value Ref Range Status  08/22/2015 n/a mg/dL Final          Passed - eGFR in normal range and within 180 days    GFR calc Af Amer  Date Value Ref Range Status  07/17/2019 78 >59 mL/min/1.73 Final    Comment:    **[facility] currently reports eGFR in compliance with the current**   recommendations of the [employer]. [facility] will   update reporting as new guidelines are published from the NKF-ASN   Task force.    GFR calc non Af Amer  Date Value Ref Range Status  07/17/2019 67 >59 mL/min/1.73 Final          Passed - Valid encounter within last 12 months    Recent Outpatient Visits           2 weeks ago Uncontrolled type 2 diabetes mellitus with hyperglycemia ([place])   [place], Jolene T, NP   1 month ago Uncontrolled type 2 diabetes mellitus with hyperglycemia ([place])   [place], Jolene T, NP   2 months ago Uncontrolled type 2 diabetes mellitus with hyperglycemia ([place])   [place], Jolene T, NP   6 months ago Brainstem infarct, acute ([place])   [place], Jolene T, NP   7 months ago Controlled type 2 diabetes mellitus without complication, without long-term current use of insulin ([place])   [place], Malachy Mood, NP       Future Appointments             In 1 week Cannady, Barbaraann Faster, NP [facility]              gabapentin (NEURONTIN) 300 MG capsule 60 capsule 1    Sig: Take 1 capsule (300 mg total) by mouth 2 (two) times daily.      Neurology: Anticonvulsants - gabapentin Passed - 09/22/2019  1:32 PM      Passed -  Valid encounter within last 12 months    Recent Outpatient Visits           2 weeks ago Uncontrolled type 2 diabetes mellitus with hyperglycemia ([place])   [place], Pennock T, NP   1 month ago Uncontrolled type 2 diabetes mellitus with hyperglycemia ([place])   [place], Smoot T, NP   2 months ago Uncontrolled type 2 diabetes mellitus with hyperglycemia ([place])   [place], Jolene T, NP   6 months ago Brainstem infarct, acute ([place])   [place], Jolene T, NP   7 months ago Controlled type 2 diabetes mellitus without complication, without long-term current use of insulin ([place])   [place] Kathrine Haddock, NP       Future Appointments             In 1 week Cannady, Henrine Screws T, NP [facility]              montelukast (SINGULAIR) 10 MG tablet 30 tablet 11    Sig: TAKE 1 TABLET BY MOUTH ONCE EVERY EVENING AT BEDTIME.      Pulmonology:  Leukotriene Inhibitors Passed - 09/22/2019  1:32 PM      Passed - Valid  encounter within last 12 months    Recent Outpatient Visits           2 weeks ago Uncontrolled type 2 diabetes mellitus with hyperglycemia ([place])   [place], Jolene T, NP   1 month ago Uncontrolled type 2 diabetes mellitus with hyperglycemia ([place])   [place], Jolene T, NP   2 months ago Uncontrolled type 2 diabetes mellitus with hyperglycemia ([place])   [place], Jolene T, NP   6 months ago Brainstem infarct, acute ([place])   [place], Jolene T, NP   7 months ago Controlled type 2 diabetes mellitus without complication, without long-term current use of insulin ([place])   [place], Malachy Mood, NP       Future Appointments             In 1 week Cannady, Barbaraann Faster, NP [facility]              omeprazole (PRILOSEC) 20 MG capsule 30 capsule 12    Sig: Take 1 capsule (20 mg total) by mouth daily.      Gastroenterology: Proton Pump Inhibitors Passed - 09/22/2019  1:32 PM      Passed - Valid encounter within last 12 months    Recent Outpatient Visits           2 weeks ago Uncontrolled type 2 diabetes mellitus with hyperglycemia ([place])   [place], Jolene T, NP   1 month ago Uncontrolled type 2 diabetes mellitus with hyperglycemia ([place])   [place], Jolene T, NP   2 months ago Uncontrolled type 2 diabetes mellitus with hyperglycemia ([place])   [place] Cannady, Jolene T, NP   6 months ago Brainstem infarct, acute ([place])   [place] Cannady, Jolene T, NP   7 months ago Controlled type 2 diabetes mellitus without complication, without long-term current use of insulin ([place])   [place] Kathrine Haddock, NP       Future Appointments             In 1 week Cannady, Barbaraann Faster, NP [facility]  lisinopril (ZESTRIL) 20 MG tablet      Sig: Take 1  tablet (20 mg total) by mouth daily.      Cardiovascular:  ACE Inhibitors Passed - 09/22/2019  1:32 PM      Passed - Cr in normal range and within 180 days    Creatinine, Ser  Date Value Ref Range Status  07/17/2019 1.20 0.76 - 1.27 mg/dL Final   Creatinine, POC  Date Value Ref Range Status  08/22/2015 n/a mg/dL Final          Passed - K in normal range and within 180 days    Potassium  Date Value Ref Range Status  07/17/2019 4.2 3.5 - 5.2 mmol/L Final          Passed - Patient is not pregnant      Passed - Last BP in normal range    BP Readings from Last 1 Encounters:  09/02/19 111/69          Passed - Valid encounter within last 6 months    Recent Outpatient Visits           2 weeks ago Uncontrolled type 2 diabetes mellitus with hyperglycemia ([place])   [place], Jolene T, NP   1 month ago Uncontrolled type 2 diabetes mellitus with hyperglycemia ([place])   [place], Jolene T, NP   2 months ago Uncontrolled type 2 diabetes mellitus with hyperglycemia ([place])   [place] Cannady, Jolene T, NP   6 months ago Brainstem infarct, acute ([place])   [place] Cannady, Jolene T, NP   7 months ago Controlled type 2 diabetes mellitus without complication, without long-term current use of insulin ([place])   [place] Kathrine Haddock, NP       Future Appointments             In 1 week Cannady, Barbaraann Faster, NP [facility]

## 2019-09-22 NOTE — Telephone Encounter (Signed)
Medication Refill - Medication:  empagliflozin (JARDIANCE) 25 MG TABS tablet [[id number]]   lisinopril (ZESTRIL) 20 MG tablet [[id number]]   omeprazole (PRILOSEC) 20 MG capsule [[id number]]   gabapentin (NEURONTIN) 300 MG capsule [[id number]]   fenofibrate 54 MG tablet [[id number]]   montelukast (SINGULAIR) 10 MG tablet [[id number]]   atorvastatin (LIPITOR) 80 MG tablet [[id number]   Preferred Pharmacy (with phone number or street name):  [place], [place] [address]  Phone: [phone] Fax: [phone]    Agent: Please be advised that RX refills may take up to 3 business days. We ask that you follow-up with your pharmacy.

## 2019-09-22 NOTE — Telephone Encounter (Signed)
Routing to provider

## 2019-09-30 ENCOUNTER — Other Ambulatory Visit: Payer: Self-pay

## 2019-09-30 ENCOUNTER — Ambulatory Visit (INDEPENDENT_AMBULATORY_CARE_PROVIDER_SITE_OTHER): Payer: Medicare Other | Admitting: Nurse Practitioner

## 2019-09-30 ENCOUNTER — Encounter: Payer: Self-pay | Admitting: Nurse Practitioner

## 2019-09-30 VITALS — BP 118/79 | HR 70 | Temp 98.1°F | Ht 64.0 in | Wt 177.0 lb

## 2019-09-30 DIAGNOSIS — J4540 Moderate persistent asthma, uncomplicated: Secondary | ICD-10-CM

## 2019-09-30 DIAGNOSIS — M1990 Unspecified osteoarthritis, unspecified site: Secondary | ICD-10-CM

## 2019-09-30 DIAGNOSIS — E701 Other hyperphenylalaninemias: Secondary | ICD-10-CM

## 2019-09-30 DIAGNOSIS — I152 Hypertension secondary to endocrine disorders: Secondary | ICD-10-CM

## 2019-09-30 DIAGNOSIS — Z6830 Body mass index (BMI) 30.0-30.9, adult: Secondary | ICD-10-CM

## 2019-09-30 DIAGNOSIS — E1165 Type 2 diabetes mellitus with hyperglycemia: Secondary | ICD-10-CM | POA: Diagnosis not present

## 2019-09-30 DIAGNOSIS — I10 Essential (primary) hypertension: Secondary | ICD-10-CM

## 2019-09-30 DIAGNOSIS — E700 Classical phenylketonuria: Secondary | ICD-10-CM | POA: Diagnosis not present

## 2019-09-30 DIAGNOSIS — E1159 Type 2 diabetes mellitus with other circulatory complications: Secondary | ICD-10-CM | POA: Diagnosis not present

## 2019-09-30 DIAGNOSIS — E1169 Type 2 diabetes mellitus with other specified complication: Secondary | ICD-10-CM

## 2019-09-30 DIAGNOSIS — E785 Hyperlipidemia, unspecified: Secondary | ICD-10-CM

## 2019-09-30 DIAGNOSIS — E6609 Other obesity due to excess calories: Secondary | ICD-10-CM

## 2019-09-30 LAB — BAYER DCA HB A1C WAIVED: HB A1C (BAYER DCA - WAIVED): 8.7 % — ABNORMAL HIGH (ref <7.0)

## 2019-09-30 MED ORDER — TIZANIDINE HCL 2 MG PO TABS: 2.0000 mg | ORAL_TABLET | Freq: Four times a day (QID) | ORAL | 1 refills | Status: DC | PRN

## 2019-09-30 MED ORDER — DICLOFENAC SODIUM 1 % EX GEL: 2.0000 g | Freq: Three times a day (TID) | CUTANEOUS | 3 refills | Status: DC

## 2019-09-30 NOTE — Assessment & Plan Note (Signed)
Chronic, ongoing.  A1C recently  >14% and today with downward trend now 8.7%.  Has been making poor diet choices, but is improving.  Continue Jardiance and increase Tresiba to 26 units QHS, if fasting BS remain >130 next week then increase to 30 units.  Poor tolerance to Metformin in past and allergic to Sulfa.  Educated patient at length on effect of diabetes from head to toe and increased risk for recurrent CVA due to poor control.  Recommend they check his BS TID.  Would avoid GLP at this time due to patient history of pancreatitis, concern this would flare.  Goal A1C <6.5%.  Continue collaboration with CCM team.  Return in 3 months.  Sent instructions on insulin increase home with him for wife review.

## 2019-09-30 NOTE — Assessment & Plan Note (Signed)
Chronic, ongoing.  Continue Lipitor and Fenofibrate. Lipid panel today.  Adjust doses as needed, if LDL >70 increase Lipitor to 80 MG daily.

## 2019-09-30 NOTE — Assessment & Plan Note (Signed)
Chronic, ongoing.  FEV1 70% last visit.  Tolerating Breo addition to regimen, to continue to use daily.  Recommend using Albuterol as needed if episodes of SOB present.  Return in 3 months.

## 2019-09-30 NOTE — Patient Instructions (Addendum)
Go up to 26 units on Tresiba    Diabetes Mellitus and Nutrition, Adult When you have diabetes (diabetes mellitus), it is very important to have healthy eating habits because your blood sugar (glucose) levels are greatly affected by what you eat and drink. Eating healthy foods in the appropriate amounts, at about the same times every day, can help you:  Control your blood glucose.  Lower your risk of heart disease.  Improve your blood pressure.  Reach or maintain a healthy weight. Every person with diabetes is different, and each person has different needs for a meal plan. Your health care provider may recommend that you work with a diet and nutrition specialist (dietitian) to make a meal plan that is best for you. Your meal plan may vary depending on factors such as:  The calories you need.  The medicines you take.  Your weight.  Your blood glucose, blood pressure, and cholesterol levels.  Your activity level.  Other health conditions you have, such as heart or kidney disease. How do carbohydrates affect me? Carbohydrates, also called carbs, affect your blood glucose level more than any other type of food. Eating carbs naturally raises the amount of glucose in your blood. Carb counting is a method for keeping track of how many carbs you eat. Counting carbs is important to keep your blood glucose at a healthy level, especially if you use insulin or take certain oral diabetes medicines. It is important to know how many carbs you can safely have in each meal. This is different for every person. Your dietitian can help you calculate how many carbs you should have at each meal and for each snack. Foods that contain carbs include:  Bread, cereal, rice, pasta, and crackers.  Potatoes and corn.  Peas, beans, and lentils.  Milk and yogurt.  Fruit and juice.  Desserts, such as cakes, cookies, ice cream, and candy. How does alcohol affect me? Alcohol can cause a sudden decrease in  blood glucose (hypoglycemia), especially if you use insulin or take certain oral diabetes medicines. Hypoglycemia can be a life-threatening condition. Symptoms of hypoglycemia (sleepiness, dizziness, and confusion) are similar to symptoms of having too much alcohol. If your health care provider says that alcohol is safe for you, follow these guidelines:  Limit alcohol intake to no more than 1 drink per day for nonpregnant women and 2 drinks per day for men. One drink equals 12 oz of beer, 5 oz of wine, or 1 oz of hard liquor.  Do not drink on an empty stomach.  Keep yourself hydrated with water, diet soda, or unsweetened iced tea.  Keep in mind that regular soda, juice, and other mixers may contain a lot of sugar and must be counted as carbs. What are tips for following this plan?  Reading food labels  Start by checking the serving size on the "Nutrition Facts" label of packaged foods and drinks. The amount of calories, carbs, fats, and other nutrients listed on the label is based on one serving of the item. Many items contain more than one serving per package.  Check the total grams (g) of carbs in one serving. You can calculate the number of servings of carbs in one serving by dividing the total carbs by 15. For example, if a food has 30 g of total carbs, it would be equal to 2 servings of carbs.  Check the number of grams (g) of saturated and trans fats in one serving. Choose foods that have low or  no amount of these fats.  Check the number of milligrams (mg) of salt (sodium) in one serving. Most people should limit total sodium intake to less than 2,300 mg per day.  Always check the nutrition information of foods labeled as "low-fat" or "nonfat". These foods may be higher in added sugar or refined carbs and should be avoided.  Talk to your dietitian to identify your daily goals for nutrients listed on the label. Shopping  Avoid buying canned, premade, or processed foods. These foods  tend to be high in fat, sodium, and added sugar.  Shop around the outside edge of the grocery store. This includes fresh fruits and vegetables, bulk grains, fresh meats, and fresh dairy. Cooking  Use low-heat cooking methods, such as baking, instead of high-heat cooking methods like deep frying.  Cook using healthy oils, such as olive, canola, or sunflower oil.  Avoid cooking with butter, cream, or high-fat meats. Meal planning  Eat meals and snacks regularly, preferably at the same times every day. Avoid going long periods of time without eating.  Eat foods high in fiber, such as fresh fruits, vegetables, beans, and whole grains. Talk to your dietitian about how many servings of carbs you can eat at each meal.  Eat 4-6 ounces (oz) of lean protein each day, such as lean meat, chicken, fish, eggs, or tofu. One oz of lean protein is equal to: ? 1 oz of meat, chicken, or fish. ? 1 egg. ?  cup of tofu.  Eat some foods each day that contain healthy fats, such as avocado, nuts, seeds, and fish. Lifestyle  Check your blood glucose regularly.  Exercise regularly as told by your health care provider. This may include: ? 150 minutes of moderate-intensity or vigorous-intensity exercise each week. This could be brisk walking, biking, or water aerobics. ? Stretching and doing strength exercises, such as yoga or weightlifting, at least 2 times a week.  Take medicines as told by your health care provider.  Do not use any products that contain nicotine or tobacco, such as cigarettes and e-cigarettes. If you need help quitting, ask your health care provider.  Work with a Social worker or diabetes educator to identify strategies to manage stress and any emotional and social challenges. Questions to ask a health care provider  Do I need to meet with a diabetes educator?  Do I need to meet with a dietitian?  What number can I call if I have questions?  When are the best times to check my blood  glucose? Where to find more information:  American Diabetes Association: diabetes.org  Academy of Nutrition and Dietetics: www.eatright.[employer] of Diabetes and Digestive and Kidney Diseases (NIH): [employer] Summary  A healthy meal plan will help you control your blood glucose and maintain a healthy lifestyle.  Working with a diet and nutrition specialist (dietitian) can help you make a meal plan that is best for you.  Keep in mind that carbohydrates (carbs) and alcohol have immediate effects on your blood glucose levels. It is important to count carbs and to use alcohol carefully. This information is not intended to replace advice given to you by your health care provider. Make sure you discuss any questions you have with your health care provider. Document Revised: 02/15/2017 Document Reviewed: 04/09/2016 Elsevier Patient Education  2020 [employer].

## 2019-09-30 NOTE — Assessment & Plan Note (Signed)
Mental delays present, continue to work with CCM team in [place] with education and reiterating diet needs.

## 2019-09-30 NOTE — Progress Notes (Signed)
BP 118/79 (BP Location: Left Arm, Patient Position: Sitting)   Pulse 70   Temp 98.1 F (36.7 C) (Oral)   Ht 5' 4" (1.626 m)   Wt 177 lb (80.3 kg)   BMI 30.38 kg/m    Subjective:    Patient ID: Alex Metro., male    DOB: [birth year], 59 y.o.   MRN: [id number]  HPI: Alex Selover. is a 59 y.o. male  Chief Complaint  Patient presents with  . Diabetes  . Hypertension  . Hyperlipidemia  . Asthma   Wife is not with him today, at baseline patient poor historian and wife often monitors medications and health.  DIABETES Recent A1C on 07/17/19 was >14% and [place] increased last visit to 15 units on 07/29/19, again increased to 18 units on 08/14/19.  He continues on Jardiance 25 MG along with [place].  His wife, [name], is in charge of medications at home and she is not present today.  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  Has seen dietician.  He reports he has stopped drinking sodas.  Has been drinking flavored water and tea.   Hypoglycemic episodes:no Polydipsia/polyuria: yes Visual disturbance: no Chest pain: no Paresthesias: no Glucose Monitoring: yes             Accucheck frequency: BID             Fasting glucose: 119 to 187             Post prandial: 180 to 200 --- one 372 on 09/13/19             Evening:             Before meals: Taking Insulin?: yes             Long acting insulin: Tresiba 22 units             Short acting insulin: Blood Pressure Monitoring: not checking Retinal Examination: Not up to Date Foot Exam: Up to Date Pneumovax: Up to Date Influenza: Up to Date Aspirin: yes   ASTHMA Has Albuterol uses as needed, did previously take Flovent.  Started Breo on 09/02/19 -- he reports this is "working good".  Never a smoker.  FEV1 70% today. 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 Sees Dr. Humphrey Rogers for cardiology was supposed to see May 17th for another echo and coronary artery screen, but he does not recall going.   Current medications Lisinopril, Atorvastatin, Fenofibrate,  Satisfied with current treatment?yes Duration of hypertension:chronic BP monitoring frequency:daily BP range:  BP medication side effects:no Duration of hyperlipidemia:chronic Cholesterol medication side effects:no Cholesterol supplements: none Medication compliance:good compliance Aspirin:yes Recent stressors:no Recurrent headaches:no Visual changes:no Palpitations:no Dyspnea:no Chest pain:no Lower extremity edema:no Dizzy/lightheaded:no  KNEE PAIN (LEFT) Had imaging August 2020 noting mild degenerative changes without acute osseous.  Knee pain has been ongoing and patient very hyper focused on this.  Had ortho referral 10/31/2018, but never attended  visit.  A new referral was placed on 07/29/19, but he reports they need money up front and he has not gone -- states he wants cyst from knee removed, discussed with him PCP does not perform this type of removal.  He reports has cyst to left knee for a good while and pain is present when he bends down, hard to get back.  Does take Meloxicam as needed.   Duration: chronic Involved knee: left Mechanism of injury: unknown Location:diffuse Onset: gradual Severity: 10/10  Quality:  dull, aching and throbbing Frequency: intermittent Radiation: no Aggravating factors: weight bearing, walking,  running, bending and movement  Alleviating factors: Meloxicam, ice and rest  Status: fluctuating Treatments attempted: Meloxicam and rest  Relief with NSAIDs?:  mild Weakness with weight bearing or walking: no Sensation of giving way: yes Locking: no Popping: no Bruising: no Swelling: no Redness: no Paresthesias/decreased sensation: no Fevers: no  Relevant past medical, surgical, family and social history reviewed and updated as indicated. Interim medical history since our last visit reviewed. Allergies and medications reviewed and updated.  Review of Systems  Constitutional: Negative for activity change, diaphoresis, fatigue and fever.  Respiratory: Negative for cough, chest tightness, shortness of breath and wheezing.   Cardiovascular: Negative for chest pain, palpitations and leg swelling.  Gastrointestinal: Negative.   Endocrine: Negative for cold intolerance, heat intolerance, polydipsia, polyphagia and polyuria.  Musculoskeletal: Positive for arthralgias.  Neurological: Negative.   Psychiatric/Behavioral: Negative.     Per HPI unless specifically indicated above     Objective:    BP 118/79 (BP Location: Left Arm, Patient Position: Sitting)   Pulse 70   Temp 98.1 F (36.7 C) (Oral)   Ht 5' 4" (1.626 m)   Wt 177 lb (80.3 kg)   BMI 30.38 kg/m   Wt Readings from Last 3 Encounters:  09/30/19 177 lb (80.3 kg)  09/02/19 176 lb 6.4 oz (80 kg)  09/02/19 176 lb 6.4 oz (80 kg)    Physical Exam Vitals and nursing note reviewed.  Constitutional:      General: He is awake. He is not in acute distress.    Appearance: He is well-developed. He is obese. He is not ill-appearing.  HENT:     Head: Normocephalic and atraumatic.     Right Ear: Hearing normal. No drainage.     Left Ear: Hearing normal. No drainage.  Eyes:     General: Lids are normal.        Right eye: No discharge.        Left eye: No discharge.     Conjunctiva/sclera: Conjunctivae normal.     Pupils:  Pupils are equal, round, and reactive to light.  Neck:     Thyroid: No thyromegaly.     Vascular: No carotid bruit.  Cardiovascular:     Rate and Rhythm: Normal rate and regular rhythm.     Heart sounds: Normal heart sounds, S1 normal and S2 normal. No murmur heard.  No gallop.   Pulmonary:     Effort: Pulmonary effort is normal. No accessory muscle usage or respiratory distress.     Breath sounds: Normal breath sounds.  Abdominal:     General: Bowel sounds are normal.     Palpations: Abdomen is soft. There is no hepatomegaly or splenomegaly.  Musculoskeletal:        General: Normal range of motion.     Cervical back: Normal range of motion and neck supple.     Right knee: Normal.  Left knee: No swelling, erythema, ecchymosis, lacerations or crepitus. Normal range of motion. No tenderness.     Right lower leg: No edema.     Left lower leg: No edema.       Legs:  Skin:    General: Skin is warm and dry.     Capillary Refill: Capillary refill takes less than 2 seconds.  Neurological:     Mental Status: He is alert and oriented to person, place, and time.     Deep Tendon Reflexes: Reflexes are normal and symmetric.  Psychiatric:        Attention and Perception: Attention normal.        Mood and Affect: Mood normal.        Speech: Speech normal.        Behavior: Behavior normal. Behavior is cooperative.     Results for orders placed or performed in visit on 07/29/19  PSA  Result Value Ref Range   Prostate Specific Ag, Serum 0.3 0.0 - 4.0 ng/mL      Assessment & Plan:   Problem List Items Addressed This Visit      Cardiovascular and Mediastinum   Hypertension associated with diabetes ([place])    Chronic, stable with BP at goal in office.  Continue current medication regimen and collaboration with cardiology, adjust regimen as needed.  Obtain BMP today.  Recommend they check his BP at home at least daily and document + focus on DASH diet.  Return to office in 3 months for BP  follow-up.      Relevant Orders   Bayer DCA Hb A1c Waived   Basic metabolic panel     Respiratory   Asthma    Chronic, ongoing.  FEV1 70% last visit.  Tolerating Breo addition to regimen, to continue to use daily.  Recommend using Albuterol as needed if episodes of SOB present.  Return in 3 months.        Endocrine   Hyperlipidemia associated with type 2 diabetes mellitus (HCC)    Chronic, ongoing.  Continue Lipitor and Fenofibrate. Lipid panel today.  Adjust doses as needed, if LDL >70 increase Lipitor to 80 MG daily.        Relevant Orders   Bayer DCA Hb A1c Waived   Lipid Panel w/o Chol/HDL Ratio   Uncontrolled type 2 diabetes mellitus with hyperglycemia (HCC) - Primary    Chronic, ongoing.  A1C recently  >14% and today with downward trend now 8.7%.  Has been making poor diet choices, but is improving.  Continue Jardiance and increase Tresiba to 26 units QHS, if fasting BS remain >130 next week then increase to 30 units.  Poor tolerance to Metformin in past and allergic to Sulfa.  Educated patient at length on effect of diabetes from head to toe and increased risk for recurrent CVA due to poor control.  Recommend they check his BS TID.  Would avoid GLP at this time due to patient history of pancreatitis, concern this would flare.  Goal A1C <6.5%.  Continue collaboration with CCM team.  Return in 3 months.  Sent instructions on insulin increase home with him for wife review.      Relevant Orders   Bayer DCA Hb A1c Waived     Musculoskeletal and Integument   Arthritis    To multiple areas, at present left knee is issue.  Missed visit with ortho last year and reports unable to afford visit at this time, will work with CCM team on this.  Has cyst-like area to lateral aspect of left knee which would benefit assessment by ortho and recommendations.  Will send in script for Voltaren gel and Tizanidine to use as needed.  Use of knee sleeve for support when walking.  Recommend Icy/Hot  Lidocaine patches, and alternate heat and ice as needed.  Return in 3 months.  Again reiterated need to visit with ortho.      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      Obesity    BMI 30.38.  Recommended eating smaller high protein, low fat meals more frequently and exercising 30 mins a day 5 times a week with a goal of 10-15lb weight loss in the next 3 months. Patient voiced their understanding and motivation to adhere to these recommendations.          Time: 25+ minutes, >50% spent counseling/or care coordination due to learning disabilities  Follow up plan: Return in about 3 months (around 12/31/2019) for T2DM, HTN/HLD, AAA.

## 2019-09-30 NOTE — Assessment & Plan Note (Signed)
BMI 30.38.  Recommended eating smaller high protein, low fat meals more frequently and exercising 30 mins a day 5 times a week with a goal of 10-15lb weight loss in the next 3 months. Patient voiced their understanding and motivation to adhere to these recommendations.

## 2019-09-30 NOTE — Assessment & Plan Note (Signed)
To multiple areas, at present left knee is issue.  Missed visit with ortho last year and reports unable to afford visit at this time, will work with CCM team on this.  Has cyst-like area to lateral aspect of left knee which would benefit assessment by ortho and recommendations.  Will send in script for Voltaren gel and Tizanidine to use as needed.  Use of knee sleeve for support when walking.  Recommend Icy/Hot Lidocaine patches, and alternate heat and ice as needed.  Return in 3 months.  Again reiterated need to visit with ortho.

## 2019-09-30 NOTE — Assessment & Plan Note (Signed)
Chronic, stable with BP at goal in office.  Continue current medication regimen and collaboration with cardiology, adjust regimen as needed.  Obtain BMP today.  Recommend they check his BP at home at least daily and document + focus on DASH diet.  Return to office in 3 months for BP follow-up.

## 2019-10-01 LAB — BASIC METABOLIC PANEL
BUN/Creatinine Ratio: 10 (ref 9–20)
BUN: 11 mg/dL (ref 6–24)
CO2: 20 mmol/L (ref 20–29)
Calcium: 9.3 mg/dL (ref 8.7–10.2)
Chloride: 109 mmol/L — ABNORMAL HIGH (ref 96–106)
Creatinine, Ser: 1.09 mg/dL (ref 0.76–1.27)
GFR calc Af Amer: 87 mL/min/{1.73_m2} (ref >59)
GFR calc non Af Amer: 75 mL/min/{1.73_m2} (ref >59)
Glucose: 236 mg/dL — ABNORMAL HIGH (ref 65–99)
Potassium: 4.2 mmol/L (ref 3.5–5.2)
Sodium: 143 mmol/L (ref 134–144)

## 2019-10-01 LAB — LIPID PANEL W/O CHOL/HDL RATIO
Cholesterol, Total: 148 mg/dL (ref 100–199)
HDL: 33 mg/dL — ABNORMAL LOW (ref >39)
LDL Chol Calc (NIH): 79 mg/dL (ref 0–99)
Triglycerides: 216 mg/dL — ABNORMAL HIGH (ref 0–149)
VLDL Cholesterol Cal: 36 mg/dL (ref 5–40)

## 2019-10-01 NOTE — Progress Notes (Signed)
Please let Lind[place] and [name] know his kidney function continues to be good and cholesterol levels are looking good.  Continue all current medications.  If any questions let me know.  Have a great day!!

## 2019-10-02 ENCOUNTER — Ambulatory Visit (INDEPENDENT_AMBULATORY_CARE_PROVIDER_SITE_OTHER): Payer: Medicare Other | Admitting: Licensed Clinical Social Worker

## 2019-10-02 DIAGNOSIS — E1159 Type 2 diabetes mellitus with other circulatory complications: Secondary | ICD-10-CM

## 2019-10-02 DIAGNOSIS — E785 Hyperlipidemia, unspecified: Secondary | ICD-10-CM

## 2019-10-02 DIAGNOSIS — M1990 Unspecified osteoarthritis, unspecified site: Secondary | ICD-10-CM

## 2019-10-02 DIAGNOSIS — E1165 Type 2 diabetes mellitus with hyperglycemia: Secondary | ICD-10-CM

## 2019-10-02 DIAGNOSIS — J4540 Moderate persistent asthma, uncomplicated: Secondary | ICD-10-CM

## 2019-10-02 DIAGNOSIS — I152 Hypertension secondary to endocrine disorders: Secondary | ICD-10-CM

## 2019-10-02 DIAGNOSIS — E1169 Type 2 diabetes mellitus with other specified complication: Secondary | ICD-10-CM

## 2019-10-02 NOTE — Chronic Care Management (AMB) (Signed)
Chronic Care Management    Clinical Social Work Follow Up Note  10/02/2019 Name: [name]. MRN: [id number] DOB: 08-31-1960  Alex Metro. is a 59 y.o. year old male who is a primary care patient of Cannady, Barbaraann Faster, NP. The CCM team was consulted for assistance with Level of Care Concerns.   Review of patient status, including review of consultants reports, other relevant assessments, and collaboration with appropriate care team members and the patient's provider was performed as part of comprehensive patient evaluation and provision of chronic care management services.    SDOH (Social Determinants of Health) assessments performed: Yes    Outpatient Encounter Medications as of 10/02/2019  Medication Sig  . acetaminophen (TYLENOL) 325 MG tablet Take 1-2 tablets (325-650 mg total) by mouth every 4 (four) hours as needed for mild pain.  [name] albuterol (PROAIR HFA) 108 (90 Base) MCG/ACT inhaler INHALE 2 PUFFS BY MOUTH EVERY 6 HOURS IF NEEDED FOR COUGH OR WHEEZING.  [name] aspirin 81 MG EC tablet Take 1 tablet (81 mg total) by mouth daily.  [name] atorvastatin (LIPITOR) 80 MG tablet Take 1 tablet (80 mg total) by mouth daily.  . Cholecalciferol (VITAMIN D3) 1.25 MG (50000 UT) CAPS Take 50,000 Units by mouth once a week.  . clobetasol cream (TEMOVATE) 8.78 % Apply 1 application topically 2 (two) times daily. Apply to affected area  . Dextran 70-Hypromellose (ARTIFICIAL TEARS) 0.1-0.3 % SOLN Apply 1 drop to eye 3 (three) times daily. To right eye only.  . diclofenac Sodium (VOLTAREN) 1 % GEL Apply 2 g topically 3 (three) times daily.  . empagliflozin (JARDIANCE) 25 MG TABS tablet Take 1 tablet (25 mg total) by mouth daily.  . fenofibrate 54 MG tablet Take 1 tablet (54 mg total) by mouth daily.  . fluticasone furoate-vilanterol (BREO ELLIPTA) 100-25 MCG/INH AEPB Inhale 1 puff into the lungs daily.  [name] gabapentin (NEURONTIN) 300 MG capsule Take 1 capsule (300 mg total) by mouth 2 (two)  times daily.  [name] glucose blood (ONETOUCH ULTRA) test strip To check blood sugar three times daily  . insulin degludec (TRESIBA FLEXTOUCH) 100 UNIT/ML FlexTouch Pen Inject 0.22 mLs (22 Units total) into the skin daily.  . Insulin Pen Needle (PEN NEEDLES) 32G X 4 MM MISC 1 kit by Does not apply route daily.  . Lancets (ONETOUCH ULTRASOFT) lancets Use as instructed  . lisinopril (ZESTRIL) 20 MG tablet Take 1 tablet (20 mg total) by mouth daily.  . montelukast (SINGULAIR) 10 MG tablet TAKE 1 TABLET BY MOUTH ONCE EVERY EVENING AT BEDTIME.  [name] neomycin-polymyxin b-dexamethasone (MAXITROL) 3.5-10000-0.1 SUSP Place 1 drop into the right eye.  (Patient not taking: Reported on 09/30/2019)  . nitroGLYCERIN (NITROSTAT) 0.4 MG SL tablet Place 1 tablet (0.4 mg total) under the tongue every 5 (five) minutes as needed for chest pain.  [name] ofloxacin (OCUFLOX) 0.3 % ophthalmic solution Place 1 drop into the right eye 4 (four) times daily. (Patient not taking: Reported on 09/30/2019)  . omeprazole (PRILOSEC) 20 MG capsule Take 1 capsule (20 mg total) by mouth daily.  . polyethylene glycol (MIRALAX) 17 g packet Take 17 g by mouth daily.  [name] tiZANidine (ZANAFLEX) 2 MG tablet Take 1 tablet (2 mg total) by mouth every 6 (six) hours as needed for muscle spasms.  [name] trimethoprim-polymyxin b (POLYTRIM) ophthalmic solution Place 1 drop into the right eye every 6 (six) hours.  (Patient not taking: Reported on 09/30/2019)   No facility-administered encounter medications on file  as of 10/02/2019.     Goals Addressed    .  SW-"I want to improve my health and self-care" (pt-stated)        Current Barriers:  . Financial constraints . Limited social support . Level of care concerns . ADL IADL limitations . Literacy concerns . Cognitive Deficits . Lacks knowledge of community resource: available financial support resources within the area as well as socialization opportunities within the area  Clinical Social Work Clinical Goal(s):   [name] Over the next 120 days, client will work with SW to address concerns related to improving self-care and implementing it whenever able  Interventions: . Patient interviewed and appropriate assessments performed . Provided mental health counseling with regard to managing everyday stressors and implementing appropriate self-care techniques in order to improve overall health. LCSW provided reflective listening and implemented appropriate interventions to help suppport patient and her emotional needs  . Provided patient with information about OTC benefits that he is uses per quarter. Patient's spouse reports that she has started to order diabetic lotion for patient and this is helping him immensely. . Patient reports trying to implement appropriate self-care by exercise and mowing his yard. Patient reports ongoing chronic pain with his knees and side.  . Discussed plans with patient for ongoing care management follow up and provided patient with direct contact information for care management team . Advised patient to contact CCM embedded practice providers and PCP if any urgent concerns arise. [name] LCSW completed referral for a wheelchair ramp and ramp installation process has been completed.  . Patient reports ongoing chronic pain and difficulty managing this without becoming irritated or ill. Patient was alert, oriented. Patient denies anxiety at this time. Discussed coping skills. SW used active and reflective listening, validated patient's feelings/concerns, and provided emotional support. . Assisted patient/caregiver with obtaining information about health plan benefits . Provided education to patient/caregiver regarding level of care options. . Patient and spouse put up christmas decorations this week in remembrance of their lost family members that passed recently. Positive reinforcement provided to family for using their creativity within their home.  . Brief self-care education provided to both  patient and spouse during outreach today. Positive reinforcement provided for positive self-care implementation.  [name] Spouse reports that patient continues to have "spells" of dizziness and fatigue which has been triggered because of the heat. Spouse reports that patient likes to spend a lot of time outside and she has tried to encourage him not to mow or work outside when it is too hot.  Patient Self Care Activities:  . Attends all scheduled provider appointments . Calls provider office for new concerns or questions  Please see past updates related to this goal by clicking on the "Past Updates" button in the selected goal       Follow Up Plan: SW will follow up with patient by phone over the next quarter  Eula Fried, [place], MSW, [email or web address] Phone: [phone]

## 2019-10-05 ENCOUNTER — Ambulatory Visit: Payer: Self-pay | Admitting: *Deleted

## 2019-10-05 NOTE — Telephone Encounter (Signed)
Pt's wife Christen Bedoya called in and was given the lab result message from Marnee Guarneri, NP dated 10/01/2019 at 8:05 AM. No questions.

## 2019-10-07 ENCOUNTER — Other Ambulatory Visit: Payer: Self-pay | Admitting: Nurse Practitioner

## 2019-10-07 ENCOUNTER — Ambulatory Visit: Payer: Self-pay | Admitting: Pharmacist

## 2019-10-07 ENCOUNTER — Telehealth: Payer: Self-pay | Admitting: Nurse Practitioner

## 2019-10-07 DIAGNOSIS — E785 Hyperlipidemia, unspecified: Secondary | ICD-10-CM | POA: Diagnosis not present

## 2019-10-07 DIAGNOSIS — E1169 Type 2 diabetes mellitus with other specified complication: Secondary | ICD-10-CM

## 2019-10-07 DIAGNOSIS — R0602 Shortness of breath: Secondary | ICD-10-CM

## 2019-10-07 DIAGNOSIS — J4540 Moderate persistent asthma, uncomplicated: Secondary | ICD-10-CM | POA: Diagnosis not present

## 2019-10-07 DIAGNOSIS — M1990 Unspecified osteoarthritis, unspecified site: Secondary | ICD-10-CM

## 2019-10-07 DIAGNOSIS — E1165 Type 2 diabetes mellitus with hyperglycemia: Secondary | ICD-10-CM

## 2019-10-07 DIAGNOSIS — E1159 Type 2 diabetes mellitus with other circulatory complications: Secondary | ICD-10-CM

## 2019-10-07 DIAGNOSIS — I10 Essential (primary) hypertension: Secondary | ICD-10-CM

## 2019-10-07 DIAGNOSIS — I152 Hypertension secondary to endocrine disorders: Secondary | ICD-10-CM

## 2019-10-07 MED ORDER — ONETOUCH ULTRA 2 W/DEVICE KIT: PACK | 0 refills | Status: DC

## 2019-10-07 NOTE — Chronic Care Management (AMB) (Signed)
Chronic Care Management   Follow Up Note   10/07/2019 Name: [name]. MRN: [id number] DOB: 11-23-1960  Referred by: [name] Reason for referral : Chronic Care Management (Medication Management)   [name]. is a 59 y.o. year old male who is a primary care patient of Cannady, [name]. The CCM team was consulted for assistance with chronic disease management and care coordination needs.    Contacted patient for medication management review. Spoke with his wife, [name].   Review of patient status, including review of consultants reports, relevant laboratory and other test results, and collaboration with appropriate care team members and the patient's provider was performed as part of comprehensive patient evaluation and provision of chronic care management services.    SDOH (Social Determinants of Health) assessments performed: Yes See Care Plan activities for detailed interventions related to SDOH)  SDOH Interventions     Most Recent Value  SDOH Interventions  Food Insecurity Interventions Other (Comment)  [care guide referral for food resources]  Financial Strain Interventions Other (Comment)  [continued care guide support]       Outpatient Encounter Medications as of 10/07/2019  Medication Sig  . acetaminophen (TYLENOL) 325 MG tablet Take 1-2 tablets (325-650 mg total) by mouth every 4 (four) hours as needed for mild pain.  [name] aspirin 81 MG EC tablet Take 1 tablet (81 mg total) by mouth daily.  [name] atorvastatin (LIPITOR) 80 MG tablet Take 1 tablet (80 mg total) by mouth daily.  . Blood Glucose Monitoring Suppl (ONE TOUCH ULTRA 2) w/Device KIT Use to check blood sugar 4 times a day  . Cholecalciferol (VITAMIN D3) 1.25 MG (50000 UT) CAPS Take 50,000 Units by mouth once a week.  . clobetasol cream (TEMOVATE) 1.74 % Apply 1 application topically 2 (two) times daily. Apply to affected area  . Dextran 70-Hypromellose (ARTIFICIAL TEARS) 0.1-0.3 %  SOLN Apply 1 drop to eye 3 (three) times daily. To right eye only.  . diclofenac Sodium (VOLTAREN) 1 % GEL Apply 2 g topically 3 (three) times daily.  . empagliflozin (JARDIANCE) 25 MG TABS tablet Take 1 tablet (25 mg total) by mouth daily.  . fenofibrate 54 MG tablet Take 1 tablet (54 mg total) by mouth daily.  . fluticasone furoate-vilanterol (BREO ELLIPTA) 100-25 MCG/INH AEPB Inhale 1 puff into the lungs daily.  [name] gabapentin (NEURONTIN) 300 MG capsule Take 1 capsule (300 mg total) by mouth 2 (two) times daily.  [name] glucose blood (ONETOUCH ULTRA) test strip To check blood sugar three times daily  . insulin degludec (TRESIBA FLEXTOUCH) 100 UNIT/ML FlexTouch Pen Inject 0.22 mLs (22 Units total) into the skin daily. (Patient taking differently: Inject 26 Units into the skin daily. )  . lisinopril (ZESTRIL) 20 MG tablet Take 1 tablet (20 mg total) by mouth daily.  . montelukast (SINGULAIR) 10 MG tablet TAKE 1 TABLET BY MOUTH ONCE EVERY EVENING AT BEDTIME.  . polyethylene glycol (MIRALAX) 17 g packet Take 17 g by mouth daily.  [name] tiZANidine (ZANAFLEX) 2 MG tablet Take 1 tablet (2 mg total) by mouth every 6 (six) hours as needed for muscle spasms.  [name] albuterol (PROAIR HFA) 108 (90 Base) MCG/ACT inhaler INHALE 2 PUFFS BY MOUTH EVERY 6 HOURS IF NEEDED FOR COUGH OR WHEEZING.  . Insulin Pen Needle (PEN NEEDLES) 32G X 4 MM MISC 1 kit by Does not apply route daily.  . Lancets (ONETOUCH ULTRASOFT) lancets Use as instructed  . neomycin-polymyxin b-dexamethasone (MAXITROL)  3.5-10000-0.1 SUSP Place 1 drop into the right eye.  (Patient not taking: Reported on 09/30/2019)  . nitroGLYCERIN (NITROSTAT) 0.4 MG SL tablet Place 1 tablet (0.4 mg total) under the tongue every 5 (five) minutes as needed for chest pain.  [name] ofloxacin (OCUFLOX) 0.3 % ophthalmic solution Place 1 drop into the right eye 4 (four) times daily. (Patient not taking: Reported on 09/30/2019)  . omeprazole (PRILOSEC) 20 MG capsule Take 1 capsule (20 mg  total) by mouth daily.  [name] trimethoprim-polymyxin b (POLYTRIM) ophthalmic solution Place 1 drop into the right eye every 6 (six) hours.  (Patient not taking: Reported on 09/30/2019)   No facility-administered encounter medications on file as of 10/07/2019.     Objective:   Goals Addressed              This Visit's Progress     Patient Stated   .  PharmD "I want to work on my diabetes" (pt-stated)        [place] (see longtitudinal plan of care for additional care plan information)  Current Barriers:  . 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 Eating better, staying active outside.  o Wonders about alternative resources for home repair, as the group they were referred to were unable to help . Current antihyperglycemic regimen: Jardiance 25 mg daily, Tresiba 26 units daily  o Metformin d/c d/t bump in Scr after restarted o Avoiding sulfonylurea d/t sulfa allergy; avoiding GLP1 d/t hx pancreatitis- though therapy could be considered in the future if TG is well controlled, therefore reducing pancreatitis risk . Current dietary patterns: o Using air fryer o Eating more vegetables, fruits lately o Drinking water w/ flavoring packets instead of sodas o Wife asks about any healthy meal delivery services . Current blood glucose readings: o Wife was not near written readings, but she indicates that sugars are improving. Fastings mostly in 100s now. One episode of midday reading of 56; patient had skipped breakfast and was out in the yard working . Cardiovascular risk reduction (follows w/ Dr. Laurelyn Sickle); next appt 9/7 for ECHO, appt 9/10 o Current hypertensive regimen: lisinopril 10 mg daily  o Current hyperlipidemia regimen: atorvastatin 80 mg; fenofibrate 48 mg daily, ezetimibe 10 mg daily; LDL at goal <100, TG slightly elevated o Current  antiplatelet regimen: ASA 81 mg daily;  . Chronic kidney disease: follows w/ Kolluru,  . S/p CVA: follows w/ Dr. Melrose Nakayama, appt 8/23  . Allergies/asthma: albuterol PRN wheezing/SOB; Breo 100/25 mcg started at last visit; loratidine 10 mg daily, montelukast 10 mg daily . Health maintenance: Vitamin D 50,000 IU weekly Q Monday  Pharmacist Clinical Goal(s):  [name] Over the next 90 days, patient with work with PharmD and primary care provider to address optimized medication management  Interventions: . Comprehensive medication review performed, medication list updated in electronic medical record . Inter-disciplinary care team collaboration (see longitudinal plan of care) . Reviewed goal A1c, goal fasting glucose, and goal 2 hour post prandial glucose. Praised wife for her support of [place] and improved dietary choices, improvement in blood sugars. Reminded her to bring Urho's blood sugar readings to appointments, or send with Sahil if she is unable to make the appointment.  . Placing Care Guide referral for food support and home repair support . Reviewed appropriate tx for symptomatic hypoglycemia (ie, small amount of sugar)  Patient Self Care Activities:  .  Patient [name] check blood glucose daily , document, and provide at future appointments . Patient [name] take medications as prescribed . Patient [name] report any questions or concerns to provider   Please see past updates related to this goal by clicking on the "Past Updates" button in the selected goal          Plan:  - Scheduled f/u call in ~ 8 weeks  Catie Darnelle Maffucci, PharmD, [place] [phone]

## 2019-10-07 NOTE — Telephone Encounter (Signed)
Called pt and left detailed vm (DPR reviewed) and asked pt to return call with any questions

## 2019-10-07 NOTE — Telephone Encounter (Signed)
Sent in new glucometer

## 2019-10-07 NOTE — Telephone Encounter (Signed)
Pt states his one touch lancet broke and he is unable to check his sugars. Pt states he broke it last night.Pt is requesting a RX so he can get a new on.

## 2019-10-07 NOTE — Telephone Encounter (Signed)
Pt's wife stated Pt has received the glucometer.

## 2019-10-07 NOTE — Telephone Encounter (Signed)
Please advise

## 2019-10-07 NOTE — Patient Instructions (Signed)
Visit Information  Goals Addressed              This Visit's Progress     Patient Stated   .  PharmD "I want to work on my diabetes" (pt-stated)        [name] (see longtitudinal plan of care for additional care plan information)  Current Barriers:  . 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 Eating better, staying active outside.  o Wonders about alternative resources for home repair, as the group they were referred to were unable to help . Current antihyperglycemic regimen: Jardiance 25 mg daily, Tresiba 26 units daily  o Metformin d/c d/t bump in Scr after restarted o Avoiding sulfonylurea d/t sulfa allergy; avoiding GLP1 d/t hx pancreatitis- though therapy could be considered in the future if TG is well controlled, therefore reducing pancreatitis risk . Current dietary patterns: o Using air fryer o Eating more vegetables, fruits lately o Drinking water w/ flavoring packets instead of sodas o Wife asks about any healthy meal delivery services . Current blood glucose readings: o Wife was not near written readings, but she indicates that sugars are improving. Fastings mostly in 100s now. One episode of midday reading of 59; patient had skipped breakfast and was out in the yard working . Cardiovascular risk reduction (follows w/ Dr. Laurelyn Sickle); next appt 9/7 for ECHO, appt 9/10 o Current hypertensive regimen: lisinopril 10 mg daily  o Current hyperlipidemia regimen: atorvastatin 80 mg; fenofibrate 48 mg daily, ezetimibe 10 mg daily; LDL at goal <100, TG slightly elevated o Current antiplatelet regimen: ASA 81 mg daily;  . Chronic kidney disease: follows w/ Kolluru,  . S/p CVA: follows w/ Dr. Melrose Nakayama, appt 8/23  . Allergies/asthma: albuterol PRN wheezing/SOB; Breo 100/25 mcg started at last visit; loratidine 10 mg daily, montelukast 10 mg  daily . Health maintenance: Vitamin D 50,000 IU weekly Q Monday  Pharmacist Clinical Goal(s):  [name] Over the next 90 days, patient with work with PharmD and primary care provider to address optimized medication management  Interventions: . Comprehensive medication review performed, medication list updated in electronic medical record . Inter-disciplinary care team collaboration (see longitudinal plan of care) . Reviewed goal A1c, goal fasting glucose, and goal 2 hour post prandial glucose. Praised wife for her support of [name] and improved dietary choices, improvement in blood sugars. Reminded her to bring [name]'s blood sugar readings to appointments, or send with [name] if she is unable to make the appointment.  . Placing Care Guide referral for food support and home repair support . Reviewed appropriate tx for symptomatic hypoglycemia (ie, small amount of sugar)  Patient Self Care Activities:  . Patient [name] check blood glucose daily , document, and provide at future appointments . Patient [name] take medications as prescribed . Patient [name] report any questions or concerns to provider   Please see past updates related to this goal by clicking on the "Past Updates" button in the selected goal         The patient verbalized understanding of instructions provided today and declined a print copy of patient instruction materials.   Plan:  - Scheduled f/u call in ~ 8 weeks  [name], PharmD, [place] [phone]

## 2019-10-09 ENCOUNTER — Telehealth: Payer: Self-pay | Admitting: Nurse Practitioner

## 2019-10-09 NOTE — Telephone Encounter (Signed)
Pt's spouse called in to request to have a new meter and strips.   Pt says that pharmacy advised to request One touch Delica Plus meter and strips Rx  from PCP. Pt would like to have sent in to pharmacy.   Pharmacy:  [facility], [place]. Phone:  [phone]  Fax:  [phone]

## 2019-10-09 NOTE — Telephone Encounter (Signed)
Form filled out, signed by Dr. Wynetta Emery and faxed to the pharmacy as requested.

## 2019-10-21 ENCOUNTER — Telehealth: Payer: Self-pay | Admitting: General Practice

## 2019-10-21 ENCOUNTER — Ambulatory Visit (INDEPENDENT_AMBULATORY_CARE_PROVIDER_SITE_OTHER): Payer: Medicare Other | Admitting: General Practice

## 2019-10-21 DIAGNOSIS — I10 Essential (primary) hypertension: Secondary | ICD-10-CM

## 2019-10-21 DIAGNOSIS — E701 Other hyperphenylalaninemias: Secondary | ICD-10-CM

## 2019-10-21 DIAGNOSIS — E1159 Type 2 diabetes mellitus with other circulatory complications: Secondary | ICD-10-CM

## 2019-10-21 DIAGNOSIS — I152 Hypertension secondary to endocrine disorders: Secondary | ICD-10-CM

## 2019-10-21 DIAGNOSIS — E1165 Type 2 diabetes mellitus with hyperglycemia: Secondary | ICD-10-CM | POA: Diagnosis not present

## 2019-10-21 DIAGNOSIS — E1169 Type 2 diabetes mellitus with other specified complication: Secondary | ICD-10-CM | POA: Diagnosis not present

## 2019-10-21 DIAGNOSIS — E785 Hyperlipidemia, unspecified: Secondary | ICD-10-CM

## 2019-10-21 DIAGNOSIS — Z8673 Personal history of transient ischemic attack (TIA), and cerebral infarction without residual deficits: Secondary | ICD-10-CM

## 2019-10-21 NOTE — Patient Instructions (Signed)
Visit Information  Goals Addressed              This Visit's Progress   .  RN: Dinero has blood sugars ranging in 300 to 500        Current Barriers:  [name] Knowledge Deficits related to Diabetes management and keeping blood sugars in a normal range as evidence of hemoglobin A1C o 10.7 at the housecalls visit from [facility] . Film/video editor.  . Literacy barriers . Cognitive Deficits  Nurse Case Manager Clinical Goal(s):  [name] Over the next 120 days, patient will verbalize understanding of plan for making lifestyle changes that will impact his Diabetes diagnosis and decrease his blood glucose levels to optimal level . Over the next 120 days, patient will work with RNCM, pcp, and interdisciplinary team  to address needs related to Diabestes  . Over the next 120 days, patient will demonstrate a decrease in hyperglycemic episodes exacerbations as evidenced by blood sugars consistently <200 and following recommended guidelines established by pcp (per his spouse blood sugar levels have been mostly in 200's but has had  300-500 readings ) 10-21-2019: Current range is 100's  per the patients wife. . Over the next 120 days, patient will attend all scheduled medical appointments: pcp on 07-29-2019 . Over the next 120 days, patient will demonstrate improved adherence to prescribed treatment plan for diabetes as evidenced byimproved dietary habits related to ADA diet   Interventions:  . Evaluation of current treatment plan related to diabetes managagement  and patient's adherence to plan as established by provider. . Advised patient to cut back on sugary drinks and sodas and drink more water, education on flavored water options available.  Per the patients wife he is now only drinking water or flavored water. Praised for accomplishments.  . Discussed plans with patient for ongoing care management follow up and provided patient with direct contact information for care management team . Provided patient with  ADA diet educational materials related to Compliance with ADA diet.  Education on watching his diet and adhering to a heart Healthy/ADA diet. The patients wife verbalized that the patient eats a lot of bread. Information given to the patients wife on foods that the patient should avoid or eat in moderation.  . Advised patient, providing education and rationale, to check cbg BID and record, calling pcp for findings outside established parameters.  Per the patients wife his blood sugars have been 300-500 at times.  10-21-2019: The patient wife states that the patient has been eating healthy and watching what he is eating and drinking. His blood sugars have been around 100 to 110. HGBA1C is trending down from earlier in the year.  o Lab Results o  Component o Value o Date o   o HGBA1C o 8.7 (H) o 09/30/2019   . Education on how diabetes can impact other organ systems like the kidneys, eyes and have a negative impact on the patients over all health. Expressed the importance of lifestyle changes to have positive impacts on the patients health.   Patient Self Care Activities:  . Patient verbalizes understanding of plan to work toward managing diabetes more effectively by following a ADA diet and making healthier choices to manage his chronic health conditions . Calls provider office for new concerns or questions . Unable to independently follow recommendations and adhere to ADA diet and effectively manage diabetes care  Please see past updates related to this goal by clicking on the "Past Updates" button in the selected goal      .  RNCM: pt's wife-"Dannis has not been taking his blood pressure, he needs new batteries" (pt-stated)        CARE PLAN ENTRY (see longtitudinal plan of care for additional care plan information)  Current Barriers:  . Chronic Disease Management support, education, and care coordination needs related to HTN ,PKU and history of CVA  Clinical Goal(s) related to HTN ,PKU and  history of CVA:  Over the next 120 days, patient will:  . Work with the care management team to address educational, disease management, and care coordination needs  . Begin or continue self health monitoring activities as directed today Measure and record blood pressure 3 times per week and adhering to a heart heatlhy/ADA/PKU free diet . Call provider office for new or worsened signs and symptoms Blood glucose findings outside established parameters, Blood pressure findings outside established parameters, Oxygen saturation lower than established parameter, Shortness of breath, and New or worsened symptom related to chronic conditions . Call care management team with questions or concerns . Verbalize basic understanding of patient centered plan of care established today  Interventions related to HTN ,PKU and history of CVA:  . Evaluation of current treatment plans and patient's adherence to plan as established by provider. The patient is more compliant with the plan of care currently. Per wife he has made positive changes. The patient has a cognitive deficit but his wife does what she can to help the patient manage his health and well being.  . Assessed patient understanding of disease states. . Assessed patient's education and care coordination needs.  Education on following a heart healthy/ADA diet.  Currently the patient is doing well at managing his diabetes and eating healthy foods and not using salt per his wife.  . Provided disease specific education to patient.  The patient needs constant reminders to follow restricted diet of low sodium, low sugar content and food/beverage without PKU . Collaborated with appropriate clinical care team members regarding patient needs  Patient Self Care Activities related to HTN ,PKU and history of CVA . Patient is unable to independently self-manage chronic health conditions  Please see past updates related to this goal by clicking on the "Past Updates"  button in the selected goal         Patient verbalizes understanding of instructions provided today.   Telephone follow up appointment with care management team member scheduled for: 12-16-2019 at 3:30 pm  Noreene Larsson RN, MSN, [facility] Mobile: [phone]

## 2019-10-21 NOTE — Chronic Care Management (AMB) (Signed)
Chronic Care Management   Follow Up Note   10/21/2019 Name: Haseeb Fiallos. MRN: [id number] DOB: 1961/02/27  Referred by: [name], NP Reason for referral : Chronic Care Management (RNCM Chronic disease management and care coordination needs)   Samy Ryner. is a 59 y.o. year old male who is a primary care patient of Cannady, Barbaraann Faster, NP. The CCM team was consulted for assistance with chronic disease management and care coordination needs.    Review of patient status, including review of consultants reports, relevant laboratory and other test results, and collaboration with appropriate care team members and the patient's provider was performed as part of comprehensive patient evaluation and provision of chronic care management services.    SDOH (Social Determinants of Health) assessments performed: Yes See Care Plan activities for detailed interventions related to [facility])     Outpatient Encounter Medications as of 10/21/2019  Medication Sig  . acetaminophen (TYLENOL) 325 MG tablet Take 1-2 tablets (325-650 mg total) by mouth every 4 (four) hours as needed for mild pain.  [name] albuterol (PROAIR HFA) 108 (90 Base) MCG/ACT inhaler INHALE 2 PUFFS BY MOUTH EVERY 6 HOURS IF NEEDED FOR COUGH OR WHEEZING.  [name] aspirin 81 MG EC tablet Take 1 tablet (81 mg total) by mouth daily.  [name] atorvastatin (LIPITOR) 80 MG tablet Take 1 tablet (80 mg total) by mouth daily.  . Blood Glucose Monitoring Suppl (ONE TOUCH ULTRA 2) w/Device KIT Use to check blood sugar 4 times a day  . Cholecalciferol (VITAMIN D3) 1.25 MG (50000 UT) CAPS Take 50,000 Units by mouth once a week.  . clobetasol cream (TEMOVATE) 1.54 % Apply 1 application topically 2 (two) times daily. Apply to affected area  . Dextran 70-Hypromellose (ARTIFICIAL TEARS) 0.1-0.3 % SOLN Apply 1 drop to eye 3 (three) times daily. To right eye only.  . diclofenac Sodium (VOLTAREN) 1 % GEL Apply 2 g topically 3 (three) times daily.  .  empagliflozin (JARDIANCE) 25 MG TABS tablet Take 1 tablet (25 mg total) by mouth daily.  . fenofibrate 54 MG tablet Take 1 tablet (54 mg total) by mouth daily.  . fluticasone furoate-vilanterol (BREO ELLIPTA) 100-25 MCG/INH AEPB Inhale 1 puff into the lungs daily.  [name] gabapentin (NEURONTIN) 300 MG capsule Take 1 capsule (300 mg total) by mouth 2 (two) times daily.  [name] glucose blood (ONETOUCH ULTRA) test strip To check blood sugar three times daily  . insulin degludec (TRESIBA FLEXTOUCH) 100 UNIT/ML FlexTouch Pen Inject 0.22 mLs (22 Units total) into the skin daily. (Patient taking differently: Inject 26 Units into the skin daily. )  . Insulin Pen Needle (PEN NEEDLES) 32G X 4 MM MISC 1 kit by Does not apply route daily.  . Lancets (ONETOUCH ULTRASOFT) lancets Use as instructed  . lisinopril (ZESTRIL) 20 MG tablet Take 1 tablet (20 mg total) by mouth daily.  . montelukast (SINGULAIR) 10 MG tablet TAKE 1 TABLET BY MOUTH ONCE EVERY EVENING AT BEDTIME.  [name] neomycin-polymyxin b-dexamethasone (MAXITROL) 3.5-10000-0.1 SUSP Place 1 drop into the right eye.  (Patient not taking: Reported on 09/30/2019)  . nitroGLYCERIN (NITROSTAT) 0.4 MG SL tablet Place 1 tablet (0.4 mg total) under the tongue every 5 (five) minutes as needed for chest pain.  [name] ofloxacin (OCUFLOX) 0.3 % ophthalmic solution Place 1 drop into the right eye 4 (four) times daily. (Patient not taking: Reported on 09/30/2019)  . omeprazole (PRILOSEC) 20 MG capsule Take 1 capsule (20 mg total) by mouth daily.  [name]  polyethylene glycol (MIRALAX) 17 g packet Take 17 g by mouth daily.  [name] tiZANidine (ZANAFLEX) 2 MG tablet Take 1 tablet (2 mg total) by mouth every 6 (six) hours as needed for muscle spasms.  [name] trimethoprim-polymyxin b (POLYTRIM) ophthalmic solution Place 1 drop into the right eye every 6 (six) hours.  (Patient not taking: Reported on 09/30/2019)   No facility-administered encounter medications on file as of 10/21/2019.     Objective:  BP  Readings from Last 3 Encounters:  09/30/19 118/79  09/02/19 111/69  09/02/19 111/69    Goals Addressed              This Visit's Progress   .  RN: Jaylene has blood sugars ranging in 300 to 500        Current Barriers:  [name] Knowledge Deficits related to Diabetes management and keeping blood sugars in a normal range as evidence of hemoglobin A1C o 10.7 at the housecalls visit from [facility] . Film/video editor.  . Literacy barriers . Cognitive Deficits  Nurse Case Manager Clinical Goal(s):  [name] Over the next 120 days, patient will verbalize understanding of plan for making lifestyle changes that will impact his Diabetes diagnosis and decrease his blood glucose levels to optimal level . Over the next 120 days, patient will work with RNCM, pcp, and interdisciplinary team  to address needs related to Diabestes  . Over the next 120 days, patient will demonstrate a decrease in hyperglycemic episodes exacerbations as evidenced by blood sugars consistently <200 and following recommended guidelines established by pcp (per his spouse blood sugar levels have been mostly in 200's but has had  300-500 readings ) 10-21-2019: Current range is 100's  per the patients wife. . Over the next 120 days, patient will attend all scheduled medical appointments: pcp on 07-29-2019 . Over the next 120 days, patient will demonstrate improved adherence to prescribed treatment plan for diabetes as evidenced byimproved dietary habits related to ADA diet   Interventions:  . Evaluation of current treatment plan related to diabetes managagement  and patient's adherence to plan as established by provider. . Advised patient to cut back on sugary drinks and sodas and drink more water, education on flavored water options available.  Per the patients wife he is now only drinking water or flavored water. Praised for accomplishments.  . Discussed plans with patient for ongoing care management follow up and provided patient with direct  contact information for care management team . Provided patient with ADA diet educational materials related to Compliance with ADA diet.  Education on watching his diet and adhering to a heart Healthy/ADA diet. The patients wife verbalized that the patient eats a lot of bread. Information given to the patients wife on foods that the patient should avoid or eat in moderation.  . Advised patient, providing education and rationale, to check cbg BID and record, calling pcp for findings outside established parameters.  Per the patients wife his blood sugars have been 300-500 at times.  10-21-2019: The patient wife states that the patient has been eating healthy and watching what he is eating and drinking. His blood sugars have been around 100 to 110. HGBA1C is trending down from earlier in the year.  o Lab Results o  Component o Value o Date o   o HGBA1C o 8.7 (H) o 09/30/2019   . Education on how diabetes can impact other organ systems like the kidneys, eyes and have a negative impact on the patients over all health.  Expressed the importance of lifestyle changes to have positive impacts on the patients health.   Patient Self Care Activities:  . Patient verbalizes understanding of plan to work toward managing diabetes more effectively by following a ADA diet and making healthier choices to manage his chronic health conditions . Calls provider office for new concerns or questions . Unable to independently follow recommendations and adhere to ADA diet and effectively manage diabetes care  Please see past updates related to this goal by clicking on the "Past Updates" button in the selected goal      .  RNCM: pt's wife-"Tiras has not been taking his blood pressure, he needs new batteries" (pt-stated)        CARE PLAN ENTRY (see longtitudinal plan of care for additional care plan information)  Current Barriers:  . Chronic Disease Management support, education, and care coordination needs related to HTN  ,PKU and history of CVA  Clinical Goal(s) related to HTN ,PKU and history of CVA:  Over the next 120 days, patient will:  . Work with the care management team to address educational, disease management, and care coordination needs  . Begin or continue self health monitoring activities as directed today Measure and record blood pressure 3 times per week and adhering to a heart heatlhy/ADA/PKU free diet . Call provider office for new or worsened signs and symptoms Blood glucose findings outside established parameters, Blood pressure findings outside established parameters, Oxygen saturation lower than established parameter, Shortness of breath, and New or worsened symptom related to chronic conditions . Call care management team with questions or concerns . Verbalize basic understanding of patient centered plan of care established today  Interventions related to HTN ,PKU and history of CVA:  . Evaluation of current treatment plans and patient's adherence to plan as established by provider. The patient is more compliant with the plan of care currently. Per wife he has made positive changes. The patient has a cognitive deficit but his wife does what she can to help the patient manage his health and well being.  . Assessed patient understanding of disease states. . Assessed patient's education and care coordination needs.  Education on following a heart healthy/ADA diet.  Currently the patient is doing well at managing his diabetes and eating healthy foods and not using salt per his wife.  . Provided disease specific education to patient.  The patient needs constant reminders to follow restricted diet of low sodium, low sugar content and food/beverage without PKU . Collaborated with appropriate clinical care team members regarding patient needs  Patient Self Care Activities related to HTN ,PKU and history of CVA . Patient is unable to independently self-manage chronic health conditions  Please see  past updates related to this goal by clicking on the "Past Updates" button in the selected goal          Plan:   Telephone follow up appointment with care management team member scheduled for: 12-16-2019 at 3:30pm   [place], MSN, [facility] Mobile: [phone]

## 2019-10-25 ENCOUNTER — Other Ambulatory Visit: Payer: Self-pay | Admitting: Family Medicine

## 2019-10-25 NOTE — Telephone Encounter (Signed)
Requested medication (s) are due for refill today: -  Requested medication (s) are on the active medication list: yes  Last refill:  03/26/19 # 8 5 RF  Future visit scheduled: yes  Notes to clinic:  Med not delegated to NT to RF-    Requested Prescriptions  Pending Prescriptions Disp Refills   Vitamin D, Ergocalciferol, (DRISDOL) 1.25 MG (50000 UNIT) CAPS capsule [Pharmacy Med Name: VITAMIN D (ERGOCALCIFEROL) 1.25 MG] 4 capsule 12    Sig: TAKE 1 CAPSULE BY MOUTH ONCE A WEEK      Endocrinology:  Vitamins - Vitamin D Supplementation Failed - 10/25/2019  2:32 PM      Failed - 50,000 IU strengths are not delegated      Failed - Phosphate in normal range and within 360 days    No results found for: PHOS        Failed - Vitamin D in normal range and within 360 days    Vit D, 25-Hydroxy  Date Value Ref Range Status  03/25/2019 26.6 (L) 30.0 - 100.0 ng/mL Final    Comment:    Vitamin D deficiency has been defined by the Institute of Medicine and an Endocrine Society practice guideline as a level of serum 25-OH vitamin D less than 20 ng/mL (1,2). The Endocrine Society went on to further define vitamin D insufficiency as a level between 21 and 29 ng/mL (2). 1. IOM (Institute of Medicine). 2010. Dietary reference    intakes for calcium and D. [place]: The    [employer]. 2. Holick MF, Binkley , Bischoff-Ferrari HA, et al.    Evaluation, treatment, and prevention of vitamin D    deficiency: an Endocrine Society clinical practice    guideline. JCEM. 2011 Jul; 96(7):1911-30.           Passed - Ca in normal range and within 360 days    Calcium  Date Value Ref Range Status  09/30/2019 9.3 8.7 - 10.2 mg/dL Final          Passed - Valid encounter within last 12 months    Recent Outpatient Visits           3 weeks ago Uncontrolled type 2 diabetes mellitus with hyperglycemia ([place])   [place], Jolene T, NP   1 month ago Uncontrolled type 2  diabetes mellitus with hyperglycemia ([place])   [place], Jolene T, NP   2 months ago Uncontrolled type 2 diabetes mellitus with hyperglycemia ([place])   [place], Jolene T, NP   3 months ago Uncontrolled type 2 diabetes mellitus with hyperglycemia ([place])   [place], Jolene T, NP   7 months ago Brainstem infarct, acute ([place])   [place], Barbaraann Faster, NP       Future Appointments             In 2 months Cannady, Barbaraann Faster, NP [employer] [facility], [facility]

## 2019-10-26 NOTE — Telephone Encounter (Signed)
Last filled 03/26/19 for 8 with 5 refills.

## 2019-11-09 DIAGNOSIS — M79605 Pain in left leg: Secondary | ICD-10-CM | POA: Diagnosis not present

## 2019-11-09 DIAGNOSIS — R531 Weakness: Secondary | ICD-10-CM | POA: Diagnosis not present

## 2019-11-09 DIAGNOSIS — Z8673 Personal history of transient ischemic attack (TIA), and cerebral infarction without residual deficits: Secondary | ICD-10-CM | POA: Diagnosis not present

## 2019-11-11 ENCOUNTER — Other Ambulatory Visit: Payer: Self-pay | Admitting: Nurse Practitioner

## 2019-11-11 ENCOUNTER — Telehealth: Payer: Self-pay | Admitting: Nurse Practitioner

## 2019-11-12 ENCOUNTER — Telehealth: Payer: Self-pay | Admitting: Nurse Practitioner

## 2019-11-19 ENCOUNTER — Ambulatory Visit: Payer: Medicare Other | Admitting: Dermatology

## 2019-11-24 ENCOUNTER — Other Ambulatory Visit: Payer: Self-pay | Admitting: Nurse Practitioner

## 2019-11-24 NOTE — Telephone Encounter (Signed)
Requested medication (s) are due for refill today: no  Requested medication (s) are on the active medication list: yes   Last refill:  09/30/2019  Future visit scheduled: yes   Notes to clinic:  This refill cannot be delegated   Requested Prescriptions  Pending Prescriptions Disp Refills   tiZANidine (ZANAFLEX) 2 MG tablet [Pharmacy Med Name: TIZANIDINE HCL 2 MG TAB] 30 tablet 1    Sig: TAKE 1 TABLET BY MOUTH EVERY 6 HOURS AS NEEDED MUSCLE SPASMS      Not Delegated - Cardiovascular:  Alpha-2 Agonists - tizanidine Failed - 11/24/2019  9:21 AM      Failed - This refill cannot be delegated      Passed - Valid encounter within last 6 months    Recent Outpatient Visits           1 month ago Uncontrolled type 2 diabetes mellitus with hyperglycemia ([place])   [place], Jolene T, NP   2 months ago Uncontrolled type 2 diabetes mellitus with hyperglycemia ([place])   [place], Jolene T, NP   3 months ago Uncontrolled type 2 diabetes mellitus with hyperglycemia ([place])   [place], Jolene T, NP   4 months ago Uncontrolled type 2 diabetes mellitus with hyperglycemia ([place])   [place] Cannady, Jolene T, NP   8 months ago Brainstem infarct, acute ([place])   [place], Barbaraann Faster, NP       Future Appointments             In 1 month Cannady, Barbaraann Faster, NP [employer] [facility], [place]   In 3 months [place], [place], [place]

## 2019-12-09 ENCOUNTER — Ambulatory Visit: Payer: Self-pay | Admitting: Pharmacist

## 2019-12-09 DIAGNOSIS — E1165 Type 2 diabetes mellitus with hyperglycemia: Secondary | ICD-10-CM

## 2019-12-09 DIAGNOSIS — I152 Hypertension secondary to endocrine disorders: Secondary | ICD-10-CM

## 2019-12-13 NOTE — Patient Instructions (Addendum)
Visit Information  It was a pleasure speaking with you today. Thank you for letting me be part of your clinical team. Please call with any questions or concerns.   Goals Addressed              This Visit's Progress   .  PharmD "I want to work on my diabetes" (pt-stated)        [place] (see longtitudinal plan of care for additional care plan information)  Current Barriers:  . 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 He fell a few a days ago while mowing the grass because he got lightheaded. He saw Dr. Melrose Nakayama in August. Restart Plavix, continue aspirin, refer to dermatology for cyst on left knee. . Current antihyperglycemic regimen: Jardiance 25 mg daily, Tresiba 26 units daily  o Metformin d/c d/t bump in Scr after restarted o Avoiding sulfonylurea d/t sulfa allergy; avoiding GLP1 d/t hx pancreatitis- though therapy could be considered in the future if TG is well controlled, therefore reducing pancreatitis risk . Current dietary patterns: o Using air fryer o Eating more vegetables, fruits lately o Drinking water w/ flavoring packets instead of sodas . Current blood glucose readings: o Wife reports he has been documenting readings, but she doesn't have his book accessiblle  Fastings mostly in 100s  Reports BG was 500 last night around 7 pm. Per wife he was snacking on chips and cookies after they had dinner at 6 PM. Reports he didn't want to go the ED so he drank a lot water and rechecked in three hours and it had gone down to 200. o Wife reports he lost the top to the lancet device so they have just been using the lancet. . Cardiovascular risk reduction (follows w/ Dr. Laurelyn Sickle);  o Current hypertensive regimen: lisinopril 10 mg daily  o Current hyperlipidemia regimen: atorvastatin 80 mg; fenofibrate 54 mg daily, ezetimibe 10 mg daily; Last  LDL 79, goal < 70, TG slightly elevated  o Current antiplatelet regimen: ASA 81 mg daily;clopidogrel 75 mg daily . Chronic kidney disease: follows w/ Kolluru,  . S/p CVA: follows w/ Dr. Melrose Nakayama. Last appt 11/09/19 -restarted clopidogel 75 mg daily . Allergies/asthma: albuterol PRN wheezing/SOB; Breo 100/25 mcg started at last visit; loratidine 10 mg daily, montelukast 10 mg daily . Health maintenance: Vitamin D 50,000 IU weekly Q Monday  Pharmacist Clinical Goal(s):  [name] Over the next 90 days, patient with work with PharmD and primary care provider to address optimized medication management  Interventions: . Comprehensive medication review performed, medication list updated in electronic medical record . Inter-disciplinary care team collaboration (see longitudinal plan of care) . Reviewed goal A1c, goal fasting glucose, and goal 2 hour post prandial glucose. Praised wife for her support of [name] and improved dietary choices, improvement in blood sugars. Reminded her to bring Ayaz's blood sugar readings to appointments, or send with Noah if she is unable to make the appointment.  . Reviewed appropriate tx for symptomatic hypoglycemia (ie, small amount of sugar)  Patient Self Care Activities:  . Patient will check blood glucose daily , document, and provide at future appointments . Patient will take medications as prescribed . Patient will report any questions or concerns to provider   Please see past updates related to this goal by clicking on the "Past Updates" button in the selected goal  The patient verbalized understanding of instructions provided today and agreed to receive a mailed copy of patient instruction and/or educational materials.  Telephone follow up appointment with pharmacy team member scheduled for:11/22/21at 1 pm  Junita Push. Kenton Kingfisher PharmD, BCPS Clinical Pharmacist [phone]  Blood Glucose Monitoring, Adult Monitoring your blood sugar (glucose) is an  important part of managing your diabetes (diabetes mellitus). Blood glucose monitoring involves checking your blood glucose as often as directed and keeping a record (log) of your results over time. Checking your blood glucose regularly and keeping a blood glucose log can:  Help you and your health care provider adjust your diabetes management plan as needed, including your medicines or insulin.  Help you understand how food, exercise, illnesses, and medicines affect your blood glucose.  Let you know what your blood glucose is at any time. You can quickly find out if you have low blood glucose (hypoglycemia) or high blood glucose (hyperglycemia). Your health care provider will set individualized treatment goals for you. Your goals will be based on your age, other medical conditions you have, and how you respond to diabetes treatment. Generally, the goal of treatment is to maintain the following blood glucose levels:  Before meals (preprandial): 80-130 mg/dL (4.4-7.2 mmol/L).  After meals (postprandial): below 180 mg/dL (10 mmol/L).  A1c level: less than 7%. Supplies needed:  Blood glucose meter.  Test strips for your meter. Each meter has its own strips. You must use the strips that came with your meter.  A needle to prick your finger (lancet). Do not use a lancet more than one time.  A device that holds the lancet (lancing device).  A journal or log book to write down your results. How to check your blood glucose  1. Wash your hands with soap and water. 2. Prick the side of your finger (not the tip) with the lancet. Use a different finger each time. 3. Gently rub the finger until a small drop of blood appears. 4. Follow instructions that come with your meter for inserting the test strip, applying blood to the strip, and using your blood glucose meter. 5. Write down your result and any notes. Some meters allow you to use areas of your body other than your finger (alternative sites)  to test your blood. The most common alternative sites are:  Forearm.  Thigh.  Palm of the hand. If you think you may have hypoglycemia, or if you have a history of not knowing when your blood glucose is getting low (hypoglycemia unawareness), do not use alternative sites. Use your finger instead. Alternative sites may not be as accurate as the fingers, because blood flow is slower in these areas. This means that the result you get may be delayed, and it may be different from the result that you would get from your finger. Follow these instructions at home: Blood glucose log   Every time you check your blood glucose, write down your result. Also write down any notes about things that may be affecting your blood glucose, such as your diet and exercise for the day. This information can help you and your health care provider: ? Look for patterns in your blood glucose over time. ? Adjust your diabetes management plan as needed.  Check if your meter allows you to download your records to a computer. Most glucose meters store a record of glucose readings in the meter. If you have type 1 diabetes:  Check your blood glucose 2 or more times a day.  Also check your blood glucose: ? Before every insulin injection. ? Before and after exercise. ? Before meals. ? 2 hours after a meal. ? Occasionally between 2:00 a.m. and 3:00 a.m., as directed. ? Before potentially dangerous tasks, like driving or using heavy machinery. ? At bedtime.  You may need to check your blood glucose more often, up to 6-10 times a day, if you: ? Use an insulin pump. ? Need multiple daily injections (MDI). ? Have diabetes that is not well-controlled. ? Are ill. ? Have a history of severe hypoglycemia. ? Have hypoglycemia unawareness. If you have type 2 diabetes:  If you take insulin or other diabetes medicines, check your blood glucose 2 or more times a day.  If you are on intensive insulin therapy, check your blood  glucose 4 or more times a day. Occasionally, you may also need to check between 2:00 a.m. and 3:00 a.m., as directed.  Also check your blood glucose: ? Before and after exercise. ? Before potentially dangerous tasks, like driving or using heavy machinery.  You may need to check your blood glucose more often if: ? Your medicine is being adjusted. ? Your diabetes is not well-controlled. ? You are ill. General tips  Always keep your supplies with you.  If you have questions or need help, all blood glucose meters have a 24-hour "hotline" phone number that you can call. You may also contact your health care provider.  After you use a few boxes of test strips, adjust (calibrate) your blood glucose meter by following instructions that came with your meter. Contact a health care provider if:  Your blood glucose is at or above 240 mg/dL (13.3 mmol/L) for 2 days in a row.  You have been sick or have had a fever for 2 days or longer, and you are not getting better.  You have any of the following problems for more than 6 hours: ? You cannot eat or drink. ? You have nausea or vomiting. ? You have diarrhea. Get help right away if:  Your blood glucose is lower than 54 mg/dL (3 mmol/L).  You become confused or you have trouble thinking clearly.  You have difficulty breathing.  You have moderate or large ketone levels in your urine. Summary  Monitoring your blood sugar (glucose) is an important part of managing your diabetes (diabetes mellitus).  Blood glucose monitoring involves checking your blood glucose as often as directed and keeping a record (log) of your results over time.  Your health care provider will set individualized treatment goals for you. Your goals will be based on your age, other medical conditions you have, and how you respond to diabetes treatment.  Every time you check your blood glucose, write down your result. Also write down any notes about things that may be  affecting your blood glucose, such as your diet and exercise for the day. This information is not intended to replace advice given to you by your health care provider. Make sure you discuss any questions you have with your health care provider. Document Revised: 12/27/2017 Document Reviewed: 08/15/2015 Elsevier Patient Education  2020 [employer].

## 2019-12-13 NOTE — Chronic Care Management (AMB) (Signed)
Chronic Care Management Pharmacy  Name: [name].  MRN: [id number] DOB: [birth year]   Chief Complaint/ HPI  [name].,  59 y.o. , male presents for their Follow-Up CCM visit with the clinical pharmacist via telephone due to COVID-19 Pandemic. Spoke with wife [name].  PCP : [name], NP Patient Care Team: [name], NP as PCP - General (Nurse Practitioner) Anell Barr, OD (Optometry) Dionisio David, MD as Consulting Physician (Cardiology) Greg Cutter, LCSW as Social Worker (Licensed Clinical Social Worker) Vanita Ingles, RN as Case Manager (General Practice)  Their chronic conditions include: Hypertension, Hyperlipidemia, Diabetes, Coronary Artery Disease, GERD, COPD and Allergic Rhinitis   Office Visits: 09/30/19- Marnee Guarneri, NP- bloodwork, Diclofenac topical, tizanidine for muscle spasms  Consult Visit:knee 11/09/19-Dr. Melrose Nakayama, Neurology - restart Plavix, referral to dermatology for cyst eval on    Allergies  Allergen Reactions  . Bee Venom Hives    All kinds of bees  . Other     Certain powders  . Sulfa Antibiotics     Medications: Outpatient Encounter Medications as of 12/09/2019  Medication Sig  . acetaminophen (TYLENOL) 325 MG tablet Take 1-2 tablets (325-650 mg total) by mouth every 4 (four) hours as needed for mild pain.  [name] albuterol (PROAIR HFA) 108 (90 Base) MCG/ACT inhaler INHALE 2 PUFFS BY MOUTH EVERY 6 HOURS IF NEEDED FOR COUGH OR WHEEZING.  [name] aspirin 81 MG EC tablet Take 1 tablet (81 mg total) by mouth daily.  [name] atorvastatin (LIPITOR) 80 MG tablet Take 1 tablet (80 mg total) by mouth daily.  . Blood Glucose Monitoring Suppl (ONE TOUCH ULTRA 2) w/Device KIT Use to check blood sugar 4 times a day  . Cholecalciferol (VITAMIN D3) 1.25 MG (50000 UT) CAPS Take 50,000 Units by mouth once a week.  . diclofenac Sodium (VOLTAREN) 1 % GEL Apply 2 g topically 3 (three) times daily.  . empagliflozin (JARDIANCE) 25  MG TABS tablet Take 1 tablet (25 mg total) by mouth daily.  . fenofibrate 54 MG tablet Take 1 tablet (54 mg total) by mouth daily.  . fluticasone furoate-vilanterol (BREO ELLIPTA) 100-25 MCG/INH AEPB Inhale 1 puff into the lungs daily.  [name] gabapentin (NEURONTIN) 300 MG capsule Take 1 capsule (300 mg total) by mouth 2 (two) times daily.  [name] glucose blood (ONETOUCH ULTRA) test strip To check blood sugar three times daily  . Insulin Pen Needle (PEN NEEDLES) 32G X 4 MM MISC 1 kit by Does not apply route daily.  . Lancets (ONETOUCH ULTRASOFT) lancets Use as instructed  . lisinopril (ZESTRIL) 20 MG tablet Take 1 tablet (20 mg total) by mouth daily.  . montelukast (SINGULAIR) 10 MG tablet TAKE 1 TABLET BY MOUTH ONCE EVERY EVENING AT BEDTIME.  . nitroGLYCERIN (NITROSTAT) 0.4 MG SL tablet Place 1 tablet (0.4 mg total) under the tongue every 5 (five) minutes as needed for chest pain.  [name] omeprazole (PRILOSEC) 20 MG capsule Take 1 capsule (20 mg total) by mouth daily.  [name] tiZANidine (ZANAFLEX) 2 MG tablet TAKE 1 TABLET BY MOUTH EVERY 6 HOURS AS NEEDED MUSCLE SPASMS  . TRESIBA FLEXTOUCH 100 UNIT/ML FlexTouch Pen INJECT 22 UNITS SUBQ ONCE DAILY  . Vitamin D, Ergocalciferol, (DRISDOL) 1.25 MG (50000 UNIT) CAPS capsule TAKE 1 CAPSULE BY MOUTH ONCE A WEEK  . clobetasol cream (TEMOVATE) 5.28 % Apply 1 application topically 2 (two) times daily. Apply to affected area  . Dextran 70-Hypromellose (ARTIFICIAL TEARS) 0.1-0.3 % SOLN  Apply 1 drop to eye 3 (three) times daily. To right eye only.  . neomycin-polymyxin b-dexamethasone (MAXITROL) 3.5-10000-0.1 SUSP Place 1 drop into the right eye.  (Patient not taking: Reported on 09/30/2019)  . ofloxacin (OCUFLOX) 0.3 % ophthalmic solution Place 1 drop into the right eye 4 (four) times daily. (Patient not taking: Reported on 09/30/2019)  . polyethylene glycol (MIRALAX) 17 g packet Take 17 g by mouth daily.  [name] trimethoprim-polymyxin b (POLYTRIM) ophthalmic solution Place 1 drop  into the right eye every 6 (six) hours.  (Patient not taking: Reported on 09/30/2019)   No facility-administered encounter medications on file as of 12/09/2019.    Wt Readings from Last 3 Encounters:  09/30/19 177 lb (80.3 kg)  09/02/19 176 lb 6.4 oz (80 kg)  09/02/19 176 lb 6.4 oz (80 kg)    Current Diagnosis/Assessment:    Goals Addressed              This Visit's Progress   .  PharmD "I want to work on my diabetes" (pt-stated)        [place] (see longtitudinal plan of care for additional care plan information)  Current Barriers:  . 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 He fell a few a days ago while mowing the grass because he got lightheaded. He saw Dr. Melrose Nakayama in August. Restart Plavix, continue aspirin, refer to dermatology for cyst on left knee. . Current antihyperglycemic regimen: Jardiance 25 mg daily, Tresiba 26 units daily  o Metformin d/c d/t bump in Scr after restarted o Avoiding sulfonylurea d/t sulfa allergy; avoiding GLP1 d/t hx pancreatitis- though therapy could be considered in the future if TG is well controlled, therefore reducing pancreatitis risk . Current dietary patterns: o Using air fryer o Eating more vegetables, fruits lately o Drinking water w/ flavoring packets instead of sodas . Current blood glucose readings: o Wife reports he has been documenting readings, but she doesn't have his book accessiblle  Fastings mostly in 100s  Reports BG was 500 last night around 7 pm. Per wife he was snacking on chips and cookies after they had dinner at 6 PM. Reports he didn't want to go the ED so he drank a lot water and rechecked in three hours and it had gone down to 200. o Wife reports he lost the top to the lancet device so they have just been using the lancet. . Cardiovascular risk reduction (follows w/ Dr. Laurelyn Sickle);  o Current hypertensive regimen: lisinopril 10 mg daily  o Current hyperlipidemia regimen: atorvastatin 80 mg; fenofibrate 54 mg daily, ezetimibe 10 mg daily; Last LDL 79, goal < 70, TG slightly elevated  o Current antiplatelet regimen: ASA 81 mg daily;clopidogrel 75 mg daily . Chronic kidney disease: follows w/ Kolluru,  . S/p CVA: follows w/ Dr. Melrose Nakayama. Last appt 11/09/19 -restarted clopidogel 75 mg daily . Allergies/asthma: albuterol PRN wheezing/SOB; Breo 100/25 mcg started at last visit; loratidine 10 mg daily, montelukast 10 mg daily . Health maintenance: Vitamin D 50,000 IU weekly Q Monday  Pharmacist Clinical Goal(s):  [name] Over the next 90 days, patient with work with PharmD and primary care provider to address optimized medication management  Interventions: . Comprehensive medication review performed, medication list updated in electronic medical record . Inter-disciplinary care team collaboration (see longitudinal plan of care) . Reviewed goal A1c, goal fasting glucose,  and goal 2 hour post prandial glucose. Praised wife for her support of Ananda and improved dietary choices, improvement in blood sugars. Reminded her to bring Garron's blood sugar readings to appointments, or send with Tage if she is unable to make the appointment.  . Reviewed appropriate tx for symptomatic hypoglycemia (ie, small amount of sugar)  Patient Self Care Activities:  . Patient will check blood glucose daily , document, and provide at future appointments . Patient will take medications as prescribed . Patient will report any questions or concerns to provider   Please see past updates related to this goal by clicking on the "Past Updates" button in the selected goal         Diabetes   A1c goal <7%  Recent Relevant Labs: Lab Results  Component Value Date/Time   HGBA1C 8.7 (H) 09/30/2019 02:21 PM   HGBA1C >14.0 (H) 07/17/2019 10:24 AM   MICROALBUR 10 07/17/2019 10:24 AM   MICROALBUR 80  (H) 03/25/2019 09:16 AM   MICROALBUR 20 08/22/2015 05:04 PM    Last diabetic Eye exam:  Lab Results  Component Value Date/Time   HMDIABEYEEXA No Retinopathy 07/01/2017 12:00 AM    Last diabetic Foot exam: No results found for: HMDIABFOOTEX   Checking BG: Daily  Recent FBG Readings: mostly in th 100's Recent 2hr PP BG readings:  500 last night   Patient has failed these meds in past: glipizide, Invokana,metfomin Patient is currently uncontrolled on the following medications: . Tyler Aas 22 units daily . Jardiance 25 mg daily  We discussed: diet and exercise extensively and how to recognize and treat signs of hypoglycemia  Plan  Continue current medications   Hypertension   BP goal is:  <130/80  Office blood pressures are  BP Readings from Last 3 Encounters:  09/30/19 118/79  09/02/19 111/69  09/02/19 111/69   Patient checks BP at home 3-5x per week Patient home BP readings are ranging: < 130/80  Patient has failed these meds in the past: Unavailable from chart Patient is currently controlled on the following medications:  . Lisinopril 20 mg daily  We discussed diet and exercise extensively  Plan  Continue current medications        Medication Management   Pt uses [facility] for all medications Uses pill box? Yes  Plan  Continue current medication management strategy    Follow up: 2 month phone visit  Junita Push. Kenton Kingfisher PharmD, [facility] [phone]

## 2019-12-14 ENCOUNTER — Telehealth: Payer: Self-pay

## 2019-12-14 ENCOUNTER — Ambulatory Visit (INDEPENDENT_AMBULATORY_CARE_PROVIDER_SITE_OTHER): Payer: Medicare Other | Admitting: Licensed Clinical Social Worker

## 2019-12-14 DIAGNOSIS — E1169 Type 2 diabetes mellitus with other specified complication: Secondary | ICD-10-CM

## 2019-12-14 DIAGNOSIS — I152 Hypertension secondary to endocrine disorders: Secondary | ICD-10-CM

## 2019-12-14 DIAGNOSIS — J4540 Moderate persistent asthma, uncomplicated: Secondary | ICD-10-CM

## 2019-12-14 DIAGNOSIS — E1165 Type 2 diabetes mellitus with hyperglycemia: Secondary | ICD-10-CM

## 2019-12-14 NOTE — Chronic Care Management (AMB) (Signed)
Chronic Care Management    Clinical Social Work Follow Up Note  12/14/2019 Name: [name]. MRN: [id number] DOB: 09/22/1960  [name]. is a 59 y.o. year old male who is a primary care patient of Cannady, Barbaraann Faster, NP. The CCM team was consulted for assistance with [name] .   Review of patient status, including review of consultants reports, other relevant assessments, and collaboration with appropriate care team members and the patient's provider was performed as part of comprehensive patient evaluation and provision of chronic care management services.    SDOH (Social Determinants of Health) assessments performed: Yes    Outpatient Encounter Medications as of 12/14/2019  Medication Sig  . acetaminophen (TYLENOL) 325 MG tablet Take 1-2 tablets (325-650 mg total) by mouth every 4 (four) hours as needed for mild pain.  [name] albuterol (PROAIR HFA) 108 (90 Base) MCG/ACT inhaler INHALE 2 PUFFS BY MOUTH EVERY 6 HOURS IF NEEDED FOR COUGH OR WHEEZING.  [name] aspirin 81 MG EC tablet Take 1 tablet (81 mg total) by mouth daily.  [name] atorvastatin (LIPITOR) 80 MG tablet Take 1 tablet (80 mg total) by mouth daily.  . Blood Glucose Monitoring Suppl (ONE TOUCH ULTRA 2) w/Device KIT Use to check blood sugar 4 times a day  . Cholecalciferol (VITAMIN D3) 1.25 MG (50000 UT) CAPS Take 50,000 Units by mouth once a week.  . clobetasol cream (TEMOVATE) 7.89 % Apply 1 application topically 2 (two) times daily. Apply to affected area  . Dextran 70-Hypromellose (ARTIFICIAL TEARS) 0.1-0.3 % SOLN Apply 1 drop to eye 3 (three) times daily. To right eye only.  . diclofenac Sodium (VOLTAREN) 1 % GEL Apply 2 g topically 3 (three) times daily.  . empagliflozin (JARDIANCE) 25 MG TABS tablet Take 1 tablet (25 mg total) by mouth daily.  . fenofibrate 54 MG tablet Take 1 tablet (54 mg total) by mouth daily.  . fluticasone furoate-vilanterol (BREO ELLIPTA) 100-25 MCG/INH AEPB Inhale 1 puff into the  lungs daily.  [name] gabapentin (NEURONTIN) 300 MG capsule Take 1 capsule (300 mg total) by mouth 2 (two) times daily.  [name] glucose blood (ONETOUCH ULTRA) test strip To check blood sugar three times daily  . Insulin Pen Needle (PEN NEEDLES) 32G X 4 MM MISC 1 kit by Does not apply route daily.  . Lancets (ONETOUCH ULTRASOFT) lancets Use as instructed  . lisinopril (ZESTRIL) 20 MG tablet Take 1 tablet (20 mg total) by mouth daily.  . montelukast (SINGULAIR) 10 MG tablet TAKE 1 TABLET BY MOUTH ONCE EVERY EVENING AT BEDTIME.  [name] neomycin-polymyxin b-dexamethasone (MAXITROL) 3.5-10000-0.1 SUSP Place 1 drop into the right eye.  (Patient not taking: Reported on 09/30/2019)  . nitroGLYCERIN (NITROSTAT) 0.4 MG SL tablet Place 1 tablet (0.4 mg total) under the tongue every 5 (five) minutes as needed for chest pain.  [name] ofloxacin (OCUFLOX) 0.3 % ophthalmic solution Place 1 drop into the right eye 4 (four) times daily. (Patient not taking: Reported on 09/30/2019)  . omeprazole (PRILOSEC) 20 MG capsule Take 1 capsule (20 mg total) by mouth daily.  . polyethylene glycol (MIRALAX) 17 g packet Take 17 g by mouth daily.  [name] tiZANidine (ZANAFLEX) 2 MG tablet TAKE 1 TABLET BY MOUTH EVERY 6 HOURS AS NEEDED MUSCLE SPASMS  . TRESIBA FLEXTOUCH 100 UNIT/ML FlexTouch Pen INJECT 22 UNITS SUBQ ONCE DAILY  . trimethoprim-polymyxin b (POLYTRIM) ophthalmic solution Place 1 drop into the right eye every 6 (six) hours.  (Patient not taking: Reported on  09/30/2019)  . Vitamin D, Ergocalciferol, (DRISDOL) 1.25 MG (50000 UNIT) CAPS capsule TAKE 1 CAPSULE BY MOUTH ONCE A WEEK   No facility-administered encounter medications on file as of 12/14/2019.     Goals Addressed    .  SW-"I want to improve my health and self-care" (pt-stated)        Current Barriers:  . Financial constraints . Limited social support . Level of care concerns . ADL IADL limitations . Literacy concerns . Cognitive Deficits . Lacks knowledge of community resource:  available financial support resources within the area as well as socialization opportunities within the area  Clinical Social Work Clinical Goal(s):  [name] Over the next 120 days, client will work with SW to address concerns related to improving self-care and implementing it whenever able  Interventions: . Patient interviewed and appropriate assessments performed . Provided mental health counseling with regard to managing everyday stressors and implementing appropriate self-care techniques in order to improve overall health. LCSW provided reflective listening and implemented appropriate interventions to help suppport patient and her emotional needs  . Provided patient with information about OTC benefits that he is uses per quarter. Patient's spouse reports that she has started to order diabetic lotion for patient and this is helping him immensely. . Patient reports trying to implement appropriate self-care by exercise and mowing his yard. Patient reports ongoing chronic pain with his knees and side.  . Discussed plans with patient for ongoing care management follow up and provided patient with direct contact information for care management team . Advised patient to contact CCM embedded practice providers and PCP if any urgent concerns arise. [name] LCSW completed referral for a wheelchair ramp in the past and ramp installation process has been completed.  . Patient reports ongoing chronic pain and difficulty managing this without becoming irritated or ill. Patient was alert, oriented. Patient denies anxiety at this time. Discussed coping skills. SW used active and reflective listening, validated patient's feelings/concerns, and provided emotional support. . Assisted patient/caregiver with obtaining information about health plan benefits . Provided education to patient/caregiver regarding level of care options. . Patient and spouse put up halloween decorations this week in remembrance of their lost family  members that passed recently. Positive reinforcement provided to family for using their creativity within their home. Patient reports that it was Sherif's idea to have a Halloween Tree this year.  . Brief self-care education provided to both patient and spouse during outreach today. Positive reinforcement provided for positive self-care implementation.  [name] Spouse reports that patient continues to have "spells" of dizziness and fatigue which has been triggered because of the heat. Spouse reports that patient likes to spend a lot of time outside and she has tried to encourage him not to mow or work outside when it is too hot.  [name] Spouse shares that she thinks patient has an infection because it hurts him to urinate. LCSW encouraged family to contact [facility] front desk for appointment if this continues.  . Per spouse, patient has worked hard at changing his eating/drinking habits. Patient is no longer drinking soda and drinks a water all day long (with 0 calorie flavor sweeteners that they buy).  . Patient had a bad day with his blood sugars (over 500) when he had a low appetite and had trouble drinking/eating appropriately. Other than that day, patient has been managing his health well per family.  . Patient's dad is currently working on transferring car title from his name to [facility] which family is  happy about.   Patient Self Care Activities:  . Attends all scheduled provider appointments . Calls provider office for new concerns or questions  Please see past updates related to this goal by clicking on the "Past Updates" button in the selected goal       Follow Up Plan: SW will follow up with patient by phone over the next quarter  Eula Fried, [place], MSW, [email or web address] Phone: [phone]

## 2019-12-16 ENCOUNTER — Telehealth: Payer: Self-pay | Admitting: General Practice

## 2019-12-16 ENCOUNTER — Ambulatory Visit: Payer: Self-pay | Admitting: General Practice

## 2019-12-16 DIAGNOSIS — E785 Hyperlipidemia, unspecified: Secondary | ICD-10-CM

## 2019-12-16 DIAGNOSIS — I10 Essential (primary) hypertension: Secondary | ICD-10-CM | POA: Diagnosis not present

## 2019-12-16 DIAGNOSIS — J4540 Moderate persistent asthma, uncomplicated: Secondary | ICD-10-CM

## 2019-12-16 DIAGNOSIS — E1169 Type 2 diabetes mellitus with other specified complication: Secondary | ICD-10-CM

## 2019-12-16 DIAGNOSIS — Z8673 Personal history of transient ischemic attack (TIA), and cerebral infarction without residual deficits: Secondary | ICD-10-CM

## 2019-12-16 DIAGNOSIS — E1159 Type 2 diabetes mellitus with other circulatory complications: Secondary | ICD-10-CM | POA: Diagnosis not present

## 2019-12-16 DIAGNOSIS — E1165 Type 2 diabetes mellitus with hyperglycemia: Secondary | ICD-10-CM

## 2019-12-16 DIAGNOSIS — E701 Other hyperphenylalaninemias: Secondary | ICD-10-CM

## 2019-12-16 DIAGNOSIS — I152 Hypertension secondary to endocrine disorders: Secondary | ICD-10-CM

## 2019-12-16 NOTE — Chronic Care Management (AMB) (Signed)
Chronic Care Management   Follow Up Note   12/16/2019 Name: [name]. MRN: [id number] DOB: April 05, 1960  Referred by: [name], NP Reason for referral : Chronic Care Management (RNCM Follow up for Chronic disease Management and Care Coordination needs)   [name]. is a 59 y.o. year old male who is a primary care patient of Cannady, Alex Faster, NP. The CCM team was consulted for assistance with chronic disease management and care coordination needs.    Review of patient status, including review of consultants reports, relevant laboratory and other test results, and collaboration with appropriate care team members and the patient's provider was performed as part of comprehensive patient evaluation and provision of chronic care management services.    SDOH (Social Determinants of Health) assessments performed: Yes See Care Plan activities for detailed interventions related to [facility])     Outpatient Encounter Medications as of 12/16/2019  Medication Sig   acetaminophen (TYLENOL) 325 MG tablet Take 1-2 tablets (325-650 mg total) by mouth every 4 (four) hours as needed for mild pain.   albuterol (PROAIR HFA) 108 (90 Base) MCG/ACT inhaler INHALE 2 PUFFS BY MOUTH EVERY 6 HOURS IF NEEDED FOR COUGH OR WHEEZING.   aspirin 81 MG EC tablet Take 1 tablet (81 mg total) by mouth daily.   atorvastatin (LIPITOR) 80 MG tablet Take 1 tablet (80 mg total) by mouth daily.   Blood Glucose Monitoring Suppl (ONE TOUCH ULTRA 2) w/Device KIT Use to check blood sugar 4 times a day   Cholecalciferol (VITAMIN D3) 1.25 MG (50000 UT) CAPS Take 50,000 Units by mouth once a week.   clobetasol cream (TEMOVATE) 3.29 % Apply 1 application topically 2 (two) times daily. Apply to affected area   Dextran 70-Hypromellose (ARTIFICIAL TEARS) 0.1-0.3 % SOLN Apply 1 drop to eye 3 (three) times daily. To right eye only.   diclofenac Sodium (VOLTAREN) 1 % GEL Apply 2 g topically 3 (three) times  daily.   empagliflozin (JARDIANCE) 25 MG TABS tablet Take 1 tablet (25 mg total) by mouth daily.   fenofibrate 54 MG tablet Take 1 tablet (54 mg total) by mouth daily.   fluticasone furoate-vilanterol (BREO ELLIPTA) 100-25 MCG/INH AEPB Inhale 1 puff into the lungs daily.   gabapentin (NEURONTIN) 300 MG capsule Take 1 capsule (300 mg total) by mouth 2 (two) times daily.   glucose blood (ONETOUCH ULTRA) test strip To check blood sugar three times daily   Insulin Pen Needle (PEN NEEDLES) 32G X 4 MM MISC 1 kit by Does not apply route daily.   Lancets (ONETOUCH ULTRASOFT) lancets Use as instructed   lisinopril (ZESTRIL) 20 MG tablet Take 1 tablet (20 mg total) by mouth daily.   montelukast (SINGULAIR) 10 MG tablet TAKE 1 TABLET BY MOUTH ONCE EVERY EVENING AT BEDTIME.   neomycin-polymyxin b-dexamethasone (MAXITROL) 3.5-10000-0.1 SUSP Place 1 drop into the right eye.  (Patient not taking: Reported on 09/30/2019)   nitroGLYCERIN (NITROSTAT) 0.4 MG SL tablet Place 1 tablet (0.4 mg total) under the tongue every 5 (five) minutes as needed for chest pain.   ofloxacin (OCUFLOX) 0.3 % ophthalmic solution Place 1 drop into the right eye 4 (four) times daily. (Patient not taking: Reported on 09/30/2019)   omeprazole (PRILOSEC) 20 MG capsule Take 1 capsule (20 mg total) by mouth daily.   polyethylene glycol (MIRALAX) 17 g packet Take 17 g by mouth daily.   tiZANidine (ZANAFLEX) 2 MG tablet TAKE 1 TABLET BY MOUTH EVERY 6 HOURS  HOURS AS NEEDED MUSCLE SPASMS   TRESIBA FLEXTOUCH 100 UNIT/ML FlexTouch Pen INJECT 22 UNITS SUBQ ONCE DAILY   trimethoprim-polymyxin b (POLYTRIM) ophthalmic solution Place 1 drop into the right eye every 6 (six) hours.  (Patient not taking: Reported on 09/30/2019)   Vitamin D, Ergocalciferol, (DRISDOL) 1.25 MG (50000 UNIT) CAPS capsule TAKE 1 CAPSULE BY MOUTH ONCE A WEEK   No facility-administered encounter medications on file as of 12/16/2019.     Objective:   Goals Addressed                This Visit's Progress     RN: Frans has blood sugars ranging in 300 to 500        Current Barriers:   Knowledge Deficits related to Diabetes management and keeping blood sugars in a normal range as evidence of hemoglobin A1C o 10.7 at the housecalls visit from [facility].   Literacy barriers  Cognitive Deficits  Nurse Case Manager Clinical Goal(s):   Over the next 120 days, patient will verbalize understanding of plan for making lifestyle changes that will impact his Diabetes diagnosis and decrease his blood glucose levels to optimal level  Over the next 120 days, patient will work with RNCM, pcp, and interdisciplinary team  to address needs related to Diabestes   Over the next 120 days, patient will demonstrate a decrease in hyperglycemic episodes exacerbations as evidenced by blood sugars consistently <200 and following recommended guidelines established by pcp (per his spouse blood sugar levels have been mostly in 200's but has had  300-500 readings )   Over the next 120 days, patient will attend all scheduled medical appointments: pcp on 12-30-2019  Over the next 120 days, patient will demonstrate improved adherence to prescribed treatment plan for diabetes as evidenced byimproved dietary habits related to ADA diet   Interventions:   Evaluation of current treatment plan related to diabetes managagement  and patient's adherence to plan as established by provider. 12-16-2019: The patient has been having elevated blood sugars and using the bathroom a lot.  States that his blood sugar a few days ago was 500.  Yesterday it was 250 and today it was 130.  The patient is eating a lot of bread.  The patients wife states he has been peeing a lot.  Confirms compliance with medications but his wife states he doesn't always eat right.   Advised patient to cut back on sugary drinks and sodas and drink more water, education on flavored water options available.   Per the patients wife he is now only drinking water or flavored water. Praised for accomplishments. 12-16-2019: The patient is still drinking water and staying away from sugary drinks. The patients wife is monitoring his dietary intake.   Discussed plans with patient for ongoing care management follow up and provided patient with direct contact information for care management team  Provided patient with ADA diet educational materials related to Compliance with ADA diet.  Education on watching his diet and adhering to a heart Healthy/ADA diet. The patients wife verbalized that the patient eats a lot of bread. Information given to the patients wife on foods that the patient should avoid or eat in moderation. 12-16-2019: the patient is still eating a lot of bread. He also sometimes goes and gets fast food when the wife does not know about it.   Advised patient, providing education and rationale, to check cbg BID and record, calling pcp for findings outside established parameters.  Per the  wife his blood sugars have been 300-500 at times.  12-16-2019: The patient wife states that the patient has been eating a lot of bread. His blood sugars have been up and down. Recently it was up to 500. Last couple of days it has been 130 to 300. HGBA1C is trending down from earlier in the year.  o Lab Results o  Component o Value o Date o   o HGBA1C o 8.7 (H) o 09/30/2019    Education on how diabetes can impact other organ systems like the kidneys, eyes and have a negative impact on the patients over all health. Expressed the importance of lifestyle changes to have positive impacts on the patients health.   Patient Self Care Activities:   Patient verbalizes understanding of plan to work toward managing diabetes more effectively by following a ADA diet and making healthier choices to manage his chronic health conditions  Calls provider office for new concerns or questions  Unable to independently follow  recommendations and adhere to ADA diet and effectively manage diabetes care  Please see past updates related to this goal by clicking on the "Past Updates" button in the selected goal        RNCM: pt's wife-"Shonn has not been taking his blood pressure, he needs new batteries" (pt-stated)        CARE PLAN ENTRY (see longtitudinal plan of care for additional care plan information)  Current Barriers:   Chronic Disease Management support, education, and care coordination needs related to HTN ,PKU and history of CVA  Clinical Goal(s) related to HTN ,PKU and history of CVA:  Over the next 120 days, patient will:   Work with the care management team to address educational, disease management, and care coordination needs   Begin or continue self health monitoring activities as directed today Measure and record blood pressure 3 times per week and adhering to a heart heatlhy/ADA/PKU free diet  Call provider office for new or worsened signs and symptoms Blood glucose findings outside established parameters, Blood pressure findings outside established parameters, Oxygen saturation lower than established parameter, Shortness of breath, and New or worsened symptom related to chronic conditions  Call care management team with questions or concerns  Verbalize basic understanding of patient centered plan of care established today  Interventions related to HTN ,PKU and history of CVA:   Evaluation of current treatment plans and patient's adherence to plan as established by provider. The patient is more compliant with the plan of care currently. Per wife he has made positive changes. The patient has a cognitive deficit but his wife does what she can to help the patient manage his health and well being.   Assessed patient understanding of disease states. 12-16-2019: The patients wife states the patient has been having burning sensation when going to the bathroom. The patient told his wife he had kidney  stones before. The patient has been going to the bathroom a lot more. Likely the reason as his blood glucose is out of control. Education and support given.   Assessed patient's education and care coordination needs.  Education on following a heart healthy/ADA diet.  Currently the patient is doing well at managing his diabetes and eating healthy foods and not using salt per his wife. 12-16-2019: Ongoing education and support related to dietary restrictions. The patients wife states she monitors his dietary intake as much as possible but sometimes he does go and gets fast food and eats things that are not good for him.  him. Education on the importance of monitoring dietary intake more closely and eating better to help with management of his chronic health conditions.   Provided disease specific education to patient.  The patient needs constant reminders to follow restricted diet of low sodium, low sugar content and food/beverage without PKU  Collaborated with appropriate clinical care team members regarding patient needs  Evaluation of upcoming appointments. The patient sees the pcp on 12-30-2019. Education on bringing calendar with readings to show the pcp at the appointment.   Patient Self Care Activities related to HTN ,PKU and history of CVA  Patient is unable to independently self-manage chronic health conditions  Please see past updates related to this goal by clicking on the "Past Updates" button in the selected goal          Plan:   Telephone follow up appointment with care management team member scheduled for: 02-24-2020 at 1:45 pm   [place], MSN, [facility] Mobile: [phone]

## 2019-12-16 NOTE — Patient Instructions (Signed)
Visit Information  Goals Addressed              This Visit's Progress   .  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  . Literacy barriers . Cognitive Deficits  Nurse Case Manager Clinical Goal(s):  [name] Over the next 120 days, patient will verbalize understanding of plan for making lifestyle changes that will impact his Diabetes diagnosis and decrease his blood glucose levels to optimal level . Over the next 120 days, patient will work with RNCM, pcp, and interdisciplinary team  to address needs related to Diabestes  . Over the next 120 days, patient will demonstrate a decrease in hyperglycemic episodes exacerbations as evidenced by blood sugars consistently <200 and following recommended guidelines established by pcp (per his spouse blood sugar levels have been mostly in 200's but has had  300-500 readings )  . Over the next 120 days, patient will attend all scheduled medical appointments: pcp on 12-30-2019 . Over the next 120 days, patient will demonstrate improved adherence to prescribed treatment plan for diabetes as evidenced byimproved dietary habits related to ADA diet   Interventions:  . Evaluation of current treatment plan related to diabetes managagement  and patient's adherence to plan as established by provider. 12-16-2019: The patient has been having elevated blood sugars and using the bathroom a lot.  States that his blood sugar a few days ago was 500.  Yesterday it was 250 and today it was 130.  The patient is eating a lot of bread.  The patients wife states he has been peeing a lot.  Confirms compliance with medications but his wife states he doesn't always eat right.  . Advised patient to cut back on sugary drinks and sodas and drink more water, education on  flavored water options available.  Per the patients wife he is now only drinking water or flavored water. Praised for accomplishments. 12-16-2019: The patient is still drinking water and staying away from sugary drinks. The patients wife is monitoring his dietary intake.  [name] Discussed plans with patient for ongoing care management follow up and provided patient with direct contact information for care management team . Provided patient with ADA diet educational materials related to Compliance with ADA diet.  Education on watching his diet and adhering to a heart Healthy/ADA diet. The patients wife verbalized that the patient eats a lot of bread. Information given to the patients wife on foods that the patient should avoid or eat in moderation. 12-16-2019: the patient is still eating a lot of bread. He also sometimes goes and gets fast food when the wife does not know about it.  . Advised patient, providing education and rationale, to check cbg BID and record, calling pcp for findings outside established parameters.  Per the patients wife his blood sugars have been 300-500 at times.  12-16-2019: The patient wife states that the patient has been eating a lot of bread. His blood sugars have been up and down. Recently it was up to 500. Last couple of days it has been 130 to 300. HGBA1C is trending down from earlier in the year.  o Lab Results o  Component o Value o Date o   o HGBA1C o 8.7 (H) o 09/30/2019   .  Education on how diabetes can impact other organ systems like the kidneys, eyes and have a negative impact on the patients over all health. Expressed the importance of lifestyle changes to have positive impacts on the patients health.   Patient Self Care Activities:  . Patient verbalizes understanding of plan to work toward managing diabetes more effectively by following a ADA diet and making healthier choices to manage his chronic health conditions . Calls provider office for new concerns or  questions . Unable to independently follow recommendations and adhere to ADA diet and effectively manage diabetes care  Please see past updates related to this goal by clicking on the "Past Updates" button in the selected goal      .  RNCM: pt's wife-"Krista has not been taking his blood pressure, he needs new batteries" (pt-stated)        CARE PLAN ENTRY (see longtitudinal plan of care for additional care plan information)  Current Barriers:  . 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 Work with the care management team to address educational, disease management, and care coordination needs  . Begin or continue self health monitoring activities as directed today Measure and record blood pressure 3 times per week and adhering to a heart heatlhy/ADA/PKU free diet . Call provider office for new or worsened signs and symptoms Blood glucose findings outside established parameters, Blood pressure findings outside established parameters, Oxygen saturation lower than established parameter, Shortness of breath, and New or worsened symptom related to chronic conditions . Call care management team with questions or concerns . Verbalize basic understanding of patient centered plan of care established today  Interventions related to HTN ,PKU and history of CVA:  . Evaluation of current treatment plans and patient's adherence to plan as established by provider. The patient is more compliant with the plan of care currently. Per wife he has made positive changes. The patient has a cognitive deficit but his wife does what she can to help the patient manage his health and well being.  . Assessed patient understanding of disease states. 12-16-2019: The patients wife states the patient has been having burning sensation when going to the  bathroom. The patient told his wife he had kidney stones before. The patient has been going to the bathroom a lot more. Likely the reason as his blood glucose is out of control. Education and support given.  . Assessed patient's education and care coordination needs.  Education on following a heart healthy/ADA diet.  Currently the patient is doing well at managing his diabetes and eating healthy foods and not using salt per his wife. 12-16-2019: Ongoing education and support related to dietary restrictions. The patients wife states she monitors his dietary intake as much as possible but sometimes he does go and gets fast food and eats things that are not good for him. Education on the importance of monitoring dietary intake more closely and eating better to help with management of his chronic health conditions.  . Provided disease specific education to patient.  The patient needs constant reminders to follow restricted diet of low sodium, low sugar content and food/beverage without PKU . Collaborated with appropriate clinical care team members regarding patient needs . Evaluation of upcoming appointments. The patient sees the pcp on 12-30-2019. Education on bringing calendar with readings to show the  pcp at the appointment.   Patient Self Care Activities related to HTN ,PKU and history of CVA . Patient is unable to independently self-manage chronic health conditions  Please see past updates related to this goal by clicking on the "Past Updates" button in the selected goal         Patient verbalizes understanding of instructions provided today.   Telephone follow up appointment with care management team member scheduled for: 02-24-2020 at 1:45 pm  [place], MSN, [facility] Mobile: [phone]

## 2019-12-19 ENCOUNTER — Other Ambulatory Visit: Payer: Self-pay | Admitting: Nurse Practitioner

## 2019-12-19 NOTE — Telephone Encounter (Signed)
Requested Prescriptions  Pending Prescriptions Disp Refills  . BREO ELLIPTA 100-25 MCG/INH AEPB [Pharmacy Med Name: BREO ELLIPTA 100-25 MCG/INH INH AEP] 60 each 1    Sig: INHALE 1 PUFF BY MOUTH ONCE DAILY     Pulmonology:  Combination Products Passed - 12/19/2019  2:53 PM      Passed - Valid encounter within last 12 months    Recent Outpatient Visits          2 months ago Uncontrolled type 2 diabetes mellitus with hyperglycemia ([place])   [place], Jolene T, NP   3 months ago Uncontrolled type 2 diabetes mellitus with hyperglycemia ([place])   [place], Jolene T, NP   4 months ago Uncontrolled type 2 diabetes mellitus with hyperglycemia ([place])   [place], Jolene T, NP   5 months ago Uncontrolled type 2 diabetes mellitus with hyperglycemia ([place])   [place], Jolene T, NP   8 months ago Brainstem infarct, acute ([place])   [place], Barbaraann Faster, NP      Future Appointments            In 1 week [place], Barbaraann Faster, NP [employer], [place]   In 2 months [place], [place], [place]           '

## 2019-12-23 ENCOUNTER — Telehealth: Payer: Self-pay

## 2019-12-30 ENCOUNTER — Telehealth: Payer: Self-pay

## 2019-12-30 ENCOUNTER — Encounter: Payer: Self-pay | Admitting: Nurse Practitioner

## 2019-12-30 ENCOUNTER — Ambulatory Visit (INDEPENDENT_AMBULATORY_CARE_PROVIDER_SITE_OTHER): Payer: Medicare Other | Admitting: Nurse Practitioner

## 2019-12-30 ENCOUNTER — Other Ambulatory Visit: Payer: Self-pay

## 2019-12-30 VITALS — BP 112/71 | HR 61 | Temp 98.1°F | Wt 178.4 lb

## 2019-12-30 DIAGNOSIS — M1990 Unspecified osteoarthritis, unspecified site: Secondary | ICD-10-CM

## 2019-12-30 DIAGNOSIS — I77811 Abdominal aortic ectasia: Secondary | ICD-10-CM

## 2019-12-30 DIAGNOSIS — E785 Hyperlipidemia, unspecified: Secondary | ICD-10-CM

## 2019-12-30 DIAGNOSIS — E1169 Type 2 diabetes mellitus with other specified complication: Secondary | ICD-10-CM

## 2019-12-30 DIAGNOSIS — I152 Hypertension secondary to endocrine disorders: Secondary | ICD-10-CM | POA: Diagnosis not present

## 2019-12-30 DIAGNOSIS — E559 Vitamin D deficiency, unspecified: Secondary | ICD-10-CM | POA: Diagnosis not present

## 2019-12-30 DIAGNOSIS — Z8673 Personal history of transient ischemic attack (TIA), and cerebral infarction without residual deficits: Secondary | ICD-10-CM

## 2019-12-30 DIAGNOSIS — E1159 Type 2 diabetes mellitus with other circulatory complications: Secondary | ICD-10-CM

## 2019-12-30 DIAGNOSIS — R300 Dysuria: Secondary | ICD-10-CM

## 2019-12-30 DIAGNOSIS — J4540 Moderate persistent asthma, uncomplicated: Secondary | ICD-10-CM

## 2019-12-30 DIAGNOSIS — E1165 Type 2 diabetes mellitus with hyperglycemia: Secondary | ICD-10-CM

## 2019-12-30 DIAGNOSIS — Z6830 Body mass index (BMI) 30.0-30.9, adult: Secondary | ICD-10-CM

## 2019-12-30 DIAGNOSIS — E701 Other hyperphenylalaninemias: Secondary | ICD-10-CM | POA: Diagnosis not present

## 2019-12-30 DIAGNOSIS — E6609 Other obesity due to excess calories: Secondary | ICD-10-CM

## 2019-12-30 LAB — UA/M W/RFLX CULTURE, ROUTINE
Bilirubin, UA: NEGATIVE
Glucose, UA: NEGATIVE
Ketones, UA: NEGATIVE
Leukocytes,UA: NEGATIVE
Nitrite, UA: NEGATIVE
Protein,UA: NEGATIVE
RBC, UA: NEGATIVE
Specific Gravity, UA: <1.005 — ABNORMAL LOW (ref 1.005–1.030)
Urobilinogen, Ur: 0.2 mg/dL (ref 0.2–1.0)
pH, UA: 5 (ref 5.0–7.5)

## 2019-12-30 LAB — BAYER DCA HB A1C WAIVED: HB A1C (BAYER DCA - WAIVED): 9.4 % — ABNORMAL HIGH (ref <7.0)

## 2019-12-30 MED ORDER — AMOXICILLIN-POT CLAVULANATE 875-125 MG PO TABS: 1.0000 | ORAL_TABLET | Freq: Two times a day (BID) | ORAL | 0 refills | Status: AC

## 2019-12-30 NOTE — Assessment & Plan Note (Signed)
Mental delays present, continue to work with CCM team in [place] with education and reiterating diet needs.

## 2019-12-30 NOTE — Assessment & Plan Note (Signed)
Chronic, stable with BP at goal in office.  Continue current medication regimen and collaboration with cardiology, adjust regimen as needed.  Obtain BMP next visit.  Recommend they check his BP at home at least daily and document + focus on DASH diet.  Return to office in 3 months for BP follow-up.

## 2019-12-30 NOTE — Assessment & Plan Note (Signed)
Chronic, ongoing.  Continue Lipitor and Fenofibrate. Lipid panel today.  Adjust doses as needed, if LDL >70 increase Lipitor to 80 MG daily.  Recent LDL 79.

## 2019-12-30 NOTE — Telephone Encounter (Signed)
Pt stated [facility] stated his account is over the limit and they would not let him get his knee brace.Pt stated that [facility] on [address] usually with Rx will give him the brace.

## 2019-12-30 NOTE — Assessment & Plan Note (Signed)
Continue supplement and recheck level today.

## 2019-12-30 NOTE — Assessment & Plan Note (Signed)
Continue collaboration with neurology and continue statin + ASA/Plavix.  Discussed goals BP <130/90, A1C < 6.5%, and LDL <70.

## 2019-12-30 NOTE — Progress Notes (Signed)
BP 112/71 (BP Location: Right Arm, Patient Position: Sitting, Cuff Size: Large)   Pulse 61   Temp 98.1 F (36.7 C) (Oral)   Wt 178 lb 6.4 oz (80.9 kg)   SpO2 98%   BMI 30.62 kg/m    Subjective:    Patient ID: Alex Metro., male    DOB: 1960/11/14, 59 y.o.   MRN: [id number]  HPI: Alex Tesch. is a 59 y.o. male  Chief Complaint  Patient presents with  . Diabetes  . Hypertension  . Hyperlipidemia  . Urinary Tract Infection   Wife is not with him today, at baseline patient poor historian and wife often monitors medications and health.  DIABETES Recent A1C in July was 8.7% and continues [place].  He continues on Jardiance 25 MG along with [place].  His wife, [name], is in charge of medications at home and she is not present today.  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  Has seen dietician.  He reports he has stopped drinking sodas.  Has been drinking flavored water and tea.    Hypoglycemic episodes:no Polydipsia/polyuria: yes Visual disturbance: no Chest pain: no Paresthesias: no Glucose Monitoring: yes             Accucheck frequency: BID -- did not bring book with him today -- states a couple times the numbers have been 400             Fasting glucose:              Post prandial:              Evening:             Before meals: Taking Insulin?: yes             Long acting insulin: Tresiba 26 units             Short acting insulin: Blood Pressure Monitoring: not checking Retinal Examination: Not up to Date Foot Exam: Up to Date Pneumovax: Up to Date Influenza: Up to Date Aspirin: yes   ASTHMA Has Albuterol uses as needed, did previously take Flovent.  Started Breo on 09/02/19 -- he reports this is "working good".  Never a smoker.  FEV1 70% last visit. 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 Sees Dr. Humphrey Rolls for cardiology states he was scheduled but had reschedule, not sure when.  Had initial visit with Dr. Melrose Nakayama on 11/09/19 -- he is to continue Plavix and ASA.  Has history of ectatic abdominal aorta and is due for ultrasound to follow up, found incidentally 10-26-2014 with recommendation for 5 year follow-up, discussed with patient and will order u/s. Current medications Lisinopril, Atorvastatin, Fenofibrate,  Satisfied with current treatment?yes Duration of hypertension:chronic BP monitoring frequency:daily BP range:  BP medication side effects:no Duration of hyperlipidemia:chronic Cholesterol medication side effects:no Cholesterol supplements: none Medication compliance:good compliance Aspirin:yes Recent stressors:no Recurrent headaches:no Visual changes:no Palpitations:no Dyspnea:no Chest pain:no Lower extremity  edema:no Dizzy/lightheaded:no  KNEE PAIN (LEFT) Had imaging August 2020 noting mild degenerative changes without acute osseous. Knee pain has been ongoing and patient very hyper focused on this.  Had ortho referral 10/31/2018, but never attended visit.  A new referral was placed on 07/29/19, but he reports they need money up front and he has not gone -- states he wants cyst from knee removed, discussed with him PCP does not perform this type of removal.  He reports he saw [facility] provider and they told him he needs the cyst removed and "might have an infection" -- he states they did not give him  and abx, they told him "you (PCP) would need to give me one".  Reports he scheduled to talk to another surgeon "up there", but does not recall when.  He reports has cyst to left knee for a good while and pain is present when he bends down, hard to get back.  Does take Meloxicam as needed.   Duration: chronic Involved knee: left Mechanism of injury: unknown Location:diffuse Onset: gradual Severity: 5/10  Quality:  dull, aching and throbbing Frequency: intermittent Radiation: no Aggravating factors: weight bearing, walking, running, bending and movement  Alleviating factors: Meloxicam, ice and rest  Status: fluctuating Treatments attempted: Meloxicam and rest  Relief with NSAIDs?:  mild Weakness with weight bearing or walking: no Sensation of giving way: yes Locking: no Popping: no Bruising: no Swelling: no Redness: no Paresthesias/decreased sensation: no Fevers: no  URINARY SYMPTOMS Reports burning with urination for weeks. Dysuria: burning Urinary frequency: yes Urgency: no Small volume voids: no Symptom severity: yes Urinary incontinence: no Foul odor: no Hematuria: no Abdominal pain: yes Back pain: no Suprapubic pain/pressure: yes Flank pain: no Fever:  no Vomiting: no Status: stable Previous urinary tract infection: no Recurrent urinary tract infection: no Sexual activity: No sexually active History of sexually transmitted disease: no Penile discharge: no Treatments attempted: increasing fluids   Relevant past medical, surgical, family and social history reviewed and updated as indicated. Interim medical history since our last visit reviewed. Allergies and medications reviewed and updated.  Review of Systems  Constitutional: Negative for activity change, diaphoresis, fatigue and fever.  Respiratory: Negative for cough, chest tightness, shortness of breath and wheezing.   Cardiovascular: Negative for chest pain, palpitations and leg swelling.   Gastrointestinal: Negative.   Endocrine: Negative for cold intolerance, heat intolerance, polydipsia, polyphagia and polyuria.  Genitourinary: Positive for dysuria and frequency. Negative for discharge, hematuria and urgency.  Musculoskeletal: Positive for arthralgias.  Neurological: Negative.   Psychiatric/Behavioral: Negative.     Per HPI unless specifically indicated above     Objective:    BP 112/71 (BP Location: Right Arm, Patient Position: Sitting, Cuff Size: Large)   Pulse 61   Temp 98.1 F (36.7 C) (Oral)   Wt 178 lb 6.4 oz (80.9 kg)   SpO2 98%   BMI 30.62 kg/m   Wt Readings from Last 3 Encounters:  12/30/19 178 lb 6.4 oz (80.9 kg)  09/30/19 177 lb (80.3 kg)  09/02/19 176 lb 6.4 oz (80 kg)    Physical Exam Vitals and nursing note reviewed.  Constitutional:      General: He is awake. He is not in acute distress.    Appearance: He is well-developed. He is obese. He is not ill-appearing.  HENT:     Head: Normocephalic and atraumatic.     Right Ear: Hearing normal. No drainage.     Left Ear: Hearing normal. No drainage.  Eyes:     General: Lids are normal.        Right eye: No discharge.        Left eye: No discharge.     Conjunctiva/sclera: Conjunctivae normal.     Pupils: Pupils are equal, round, and reactive to light.  Neck:     Thyroid: No thyromegaly.     Vascular: No carotid bruit.  Cardiovascular:     Rate and Rhythm: Normal rate and regular rhythm.     Heart sounds: Normal heart sounds, S1 normal and S2 normal. No murmur heard.  No gallop.   Pulmonary:     Effort: Pulmonary effort is normal. No accessory muscle usage or respiratory distress.     Breath sounds: Normal breath sounds.  Abdominal:     General: Bowel sounds are normal.     Palpations: Abdomen is soft. There is no hepatomegaly or splenomegaly.     Tenderness: There is no abdominal tenderness. There is no right CVA tenderness, left CVA tenderness or guarding.     Hernia: A hernia is  present. Hernia is present in the umbilical area and ventral area.  Musculoskeletal:        General: Normal range of motion.     Cervical back: Normal range of motion and neck supple.     Right knee: Normal.     Left knee: No swelling, erythema, ecchymosis, lacerations or crepitus. Normal range of motion. No tenderness.     Right lower leg: No edema.     Left lower leg: No edema.       Legs:  Skin:    General: Skin is warm and dry.     Capillary Refill: Capillary refill takes less than 2 seconds.  Neurological:     Mental Status: He is alert and oriented to person, place, and time.     Deep Tendon Reflexes: Reflexes are normal and symmetric.  Psychiatric:        Attention and Perception: Attention normal.        Mood and Affect: Mood normal.        Speech: Speech normal.        Behavior: Behavior normal. Behavior is cooperative.     Results for orders placed or performed in visit on 09/30/19  Bayer DCA Hb A1c Waived  Result Value Ref Range   HB A1C (BAYER DCA - WAIVED) 8.7 (H) <7.0 %  Lipid Panel w/o Chol/HDL Ratio  Result Value Ref Range   Cholesterol, Total 148 100 - 199 mg/dL   Triglycerides 216 (H) 0 - 149 mg/dL   HDL 33 (L) >39 mg/dL   VLDL Cholesterol Cal 36 5 - 40 mg/dL   LDL Chol Calc (NIH) 79 0 - 99 mg/dL  Basic metabolic panel  Result Value Ref Range   Glucose 236 (H) 65 - 99 mg/dL   BUN 11 6 - 24 mg/dL   Creatinine, Ser 1.09 0.76 - 1.27 mg/dL   GFR calc non Af Amer 75 >59 mL/min/1.73   GFR calc Af Amer 87 >59 mL/min/1.73   BUN/Creatinine Ratio 10 9 - 20   Sodium 143 134 - 144 mmol/L   Potassium 4.2 3.5 - 5.2 mmol/L   Chloride 109 (H) 96 - 106 mmol/L   CO2 20 20 - 29 mmol/L   Calcium 9.3 8.7 - 10.2 mg/dL      Assessment & Plan:   Problem List Items Addressed This Visit      Cardiovascular and Mediastinum  Hypertension associated with diabetes ([place]) - Primary    Chronic, stable with BP at goal in office.  Continue current medication regimen and  collaboration with cardiology, adjust regimen as needed.  Obtain BMP next visit.  Recommend they check his BP at home at least daily and document + focus on DASH diet.  Return to office in 3 months for BP follow-up.      Relevant Orders   Bayer DCA Hb A1c Waived   Comprehensive metabolic panel   Ectatic abdominal aorta (HCC)    Chronic, stable with no symptoms.  Continue tight BP control and statin + ASA daily.  Discussed with him importance of diabetes control.  Repeat ultrasound ordered.      Relevant Orders   US Abdomen Complete     Respiratory   Asthma    Chronic, ongoing.  FEV1 70% recently.  Tolerating Breo, to continue to use daily.  Recommend using Albuterol as needed if episodes of SOB present.  Return in 3 months.        Endocrine   Hyperlipidemia associated with type 2 diabetes mellitus (HCC)    Chronic, ongoing.  Continue Lipitor and Fenofibrate. Lipid panel today.  Adjust doses as needed, if LDL >70 increase Lipitor to 80 MG daily.  Recent LDL 79.      Relevant Orders   Bayer DCA Hb A1c Waived   Lipid Panel w/o Chol/HDL Ratio   Uncontrolled type 2 diabetes mellitus with hyperglycemia (HCC)    Chronic, ongoing.  A1C upward trend today to 9.4%.  Has been making poor diet choices.  Continue Jardiance and increase Tresiba to 30 units QHS, if fasting BS remain >130 in 3 days then increase to 34 units -- wrote instructions for his wife to review.  Poor tolerance to Metformin in past and allergic to Sulfa.  Educated patient at length on effect of diabetes from head to toe and increased risk for recurrent CVA due to poor control.  Recommend they check his BS TID.  Would avoid GLP at this time due to patient history of pancreatitis, concern this would flare.  Goal A1C <6.5%.  Continue collaboration with CCM team.  Return in 4 weeks, will try to get wife on speaker phone for this.      Relevant Orders   Bayer DCA Hb A1c Waived   Comprehensive metabolic panel     Musculoskeletal  and Integument   Arthritis    To multiple areas, at present left knee continues to be issue with cyst present. Reports ortho seen and they recommend abx, but have no documentation on this.  Has cyst-like area to lateral aspect of left knee which would benefit assessment by ortho and recommendations.  Will send in script for Augmentin at this time.  Use of knee sleeve for support when walking.  Recommend Icy/Hot Lidocaine patches, and alternate heat and ice as needed.  Return in 3 months.  Again reiterated need to visit with ortho.        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      Vitamin D deficiency    Continue supplement and recheck level today.      Relevant Orders   VITAMIN D 25 Hydroxy (Vit-D Deficiency, Fractures)   Obesity    BMI 30.62.  Recommended eating smaller high protein, low fat meals more frequently and exercising 30 mins a day 5  times a week with a goal of 10-15lb weight loss in the next 3 months. Patient voiced their understanding and motivation to adhere to these recommendations.       History of CVA (cerebrovascular accident)    Continue collaboration with neurology and continue statin + ASA/Plavix.  Discussed goals BP <130/90, A1C < 6.5%, and LDL <70.         Other Visit Diagnoses    Dysuria       UA negative with exception of 3+ glucose, poorly controlled diabetic.  Suspect this is cause for frequency issues and discussed with him.  Return for worsening.   Relevant Orders   UA/M w/rflx Culture, Routine      Time: 25+ minutes, >50% spent counseling/or care coordination due to learning disabilities  Follow up plan: Return in about 4 weeks (around 01/27/2020) for Diabetes -- insulin increase.

## 2019-12-30 NOTE — Patient Instructions (Addendum)
Increase Tresiba to 30 units, if elevations in morning sugars greater than 130 over the next 3 days then increase to 34 units.     Diabetes Mellitus and Nutrition, Adult When you have diabetes (diabetes mellitus), it is very important to have healthy eating habits because your blood sugar (glucose) levels are greatly affected by what you eat and drink. Eating healthy foods in the appropriate amounts, at about the same times every day, can help you:  Control your blood glucose.  Lower your risk of heart disease.  Improve your blood pressure.  Reach or maintain a healthy weight. Every person with diabetes is different, and each person has different needs for a meal plan. Your health care provider may recommend that you work with a diet and nutrition specialist (dietitian) to make a meal plan that is best for you. Your meal plan may vary depending on factors such as:  The calories you need.  The medicines you take.  Your weight.  Your blood glucose, blood pressure, and cholesterol levels.  Your activity level.  Other health conditions you have, such as heart or kidney disease. How do carbohydrates affect me? Carbohydrates, also called carbs, affect your blood glucose level more than any other type of food. Eating carbs naturally raises the amount of glucose in your blood. Carb counting is a method for keeping track of how many carbs you eat. Counting carbs is important to keep your blood glucose at a healthy level, especially if you use insulin or take certain oral diabetes medicines. It is important to know how many carbs you can safely have in each meal. This is different for every person. Your dietitian can help you calculate how many carbs you should have at each meal and for each snack. Foods that contain carbs include:  Bread, cereal, rice, pasta, and crackers.  Potatoes and corn.  Peas, beans, and lentils.  Milk and yogurt.  Fruit and juice.  Desserts, such as cakes,  cookies, ice cream, and candy. How does alcohol affect me? Alcohol can cause a sudden decrease in blood glucose (hypoglycemia), especially if you use insulin or take certain oral diabetes medicines. Hypoglycemia can be a life-threatening condition. Symptoms of hypoglycemia (sleepiness, dizziness, and confusion) are similar to symptoms of having too much alcohol. If your health care provider says that alcohol is safe for you, follow these guidelines:  Limit alcohol intake to no more than 1 drink per day for nonpregnant women and 2 drinks per day for men. One drink equals 12 oz of beer, 5 oz of wine, or 1 oz of hard liquor.  Do not drink on an empty stomach.  Keep yourself hydrated with water, diet soda, or unsweetened iced tea.  Keep in mind that regular soda, juice, and other mixers may contain a lot of sugar and must be counted as carbs. What are tips for following this plan?  Reading food labels  Start by checking the serving size on the "Nutrition Facts" label of packaged foods and drinks. The amount of calories, carbs, fats, and other nutrients listed on the label is based on one serving of the item. Many items contain more than one serving per package.  Check the total grams (g) of carbs in one serving. You can calculate the number of servings of carbs in one serving by dividing the total carbs by 15. For example, if a food has 30 g of total carbs, it would be equal to 2 servings of carbs.  Check the number  of grams (g) of saturated and trans fats in one serving. Choose foods that have low or no amount of these fats.  Check the number of milligrams (mg) of salt (sodium) in one serving. Most people should limit total sodium intake to less than 2,300 mg per day.  Always check the nutrition information of foods labeled as "low-fat" or "nonfat". These foods may be higher in added sugar or refined carbs and should be avoided.  Talk to your dietitian to identify your daily goals for  nutrients listed on the label. Shopping  Avoid buying canned, premade, or processed foods. These foods tend to be high in fat, sodium, and added sugar.  Shop around the outside edge of the grocery store. This includes fresh fruits and vegetables, bulk grains, fresh meats, and fresh dairy. Cooking  Use low-heat cooking methods, such as baking, instead of high-heat cooking methods like deep frying.  Cook using healthy oils, such as olive, canola, or sunflower oil.  Avoid cooking with butter, cream, or high-fat meats. Meal planning  Eat meals and snacks regularly, preferably at the same times every day. Avoid going long periods of time without eating.  Eat foods high in fiber, such as fresh fruits, vegetables, beans, and whole grains. Talk to your dietitian about how many servings of carbs you can eat at each meal.  Eat 4-6 ounces (oz) of lean protein each day, such as lean meat, chicken, fish, eggs, or tofu. One oz of lean protein is equal to: ? 1 oz of meat, chicken, or fish. ? 1 egg. ?  cup of tofu.  Eat some foods each day that contain healthy fats, such as avocado, nuts, seeds, and fish. Lifestyle  Check your blood glucose regularly.  Exercise regularly as told by your health care provider. This may include: ? 150 minutes of moderate-intensity or vigorous-intensity exercise each week. This could be brisk walking, biking, or water aerobics. ? Stretching and doing strength exercises, such as yoga or weightlifting, at least 2 times a week.  Take medicines as told by your health care provider.  Do not use any products that contain nicotine or tobacco, such as cigarettes and e-cigarettes. If you need help quitting, ask your health care provider.  Work with a Social worker or diabetes educator to identify strategies to manage stress and any emotional and social challenges. Questions to ask a health care provider  Do I need to meet with a diabetes educator?  Do I need to meet with a  dietitian?  What number can I call if I have questions?  When are the best times to check my blood glucose? Where to find more information:  American Diabetes Association: diabetes.org  Academy of Nutrition and Dietetics: www.eatright.[employer] of Diabetes and Digestive and Kidney Diseases (NIH): [employer] Summary  A healthy meal plan will help you control your blood glucose and maintain a healthy lifestyle.  Working with a diet and nutrition specialist (dietitian) can help you make a meal plan that is best for you.  Keep in mind that carbohydrates (carbs) and alcohol have immediate effects on your blood glucose levels. It is important to count carbs and to use alcohol carefully. This information is not intended to replace advice given to you by your health care provider. Make sure you discuss any questions you have with your health care provider. Document Revised: 02/15/2017 Document Reviewed: 04/09/2016 Elsevier Patient Education  2020 [employer].

## 2019-12-30 NOTE — Assessment & Plan Note (Signed)
To multiple areas, at present left knee continues to be issue with cyst present. Reports ortho seen and they recommend abx, but have no documentation on this.  Has cyst-like area to lateral aspect of left knee which would benefit assessment by ortho and recommendations.  Will send in script for Augmentin at this time.  Use of knee sleeve for support when walking.  Recommend Icy/Hot Lidocaine patches, and alternate heat and ice as needed.  Return in 3 months.  Again reiterated need to visit with ortho.

## 2019-12-30 NOTE — Assessment & Plan Note (Signed)
BMI 30.62.  Recommended eating smaller high protein, low fat meals more frequently and exercising 30 mins a day 5 times a week with a goal of 10-15lb weight loss in the next 3 months. Patient voiced their understanding and motivation to adhere to these recommendations.

## 2019-12-30 NOTE — Assessment & Plan Note (Signed)
Chronic, ongoing.  FEV1 70% recently.  Tolerating Breo, to continue to use daily.  Recommend using Albuterol as needed if episodes of SOB present.  Return in 3 months.

## 2019-12-30 NOTE — Telephone Encounter (Signed)
Can we follow-up with patient tomorrow to see exactly what brace he wants and then ensure [facility] will  have this and do this for patient.  Thank you:)

## 2019-12-30 NOTE — Assessment & Plan Note (Addendum)
Chronic, ongoing.  A1C upward trend today to 9.4%.  Has been making poor diet choices.  Continue Jardiance and increase Tresiba to 30 units QHS, if fasting BS remain >130 in 3 days then increase to 34 units -- wrote instructions for his wife to review.  Poor tolerance to Metformin in past and allergic to Sulfa.  Educated patient at length on effect of diabetes from head to toe and increased risk for recurrent CVA due to poor control.  Recommend they check his BS TID.  Would avoid GLP at this time due to patient history of pancreatitis, concern this would flare.  Goal A1C <6.5%.  Continue collaboration with CCM team.  Return in 4 weeks, will try to get wife on speaker phone for this.

## 2019-12-30 NOTE — Assessment & Plan Note (Signed)
Chronic, stable with no symptoms.  Continue tight BP control and statin + ASA daily.  Discussed with him importance of diabetes control.  Repeat ultrasound ordered.

## 2019-12-31 LAB — COMPREHENSIVE METABOLIC PANEL
ALT: 25 IU/L (ref 0–44)
AST: 18 IU/L (ref 0–40)
Albumin/Globulin Ratio: 1.8 (ref 1.2–2.2)
Albumin: 4.6 g/dL (ref 3.8–4.9)
Alkaline Phosphatase: 130 IU/L — ABNORMAL HIGH (ref 44–121)
BUN/Creatinine Ratio: 15 (ref 9–20)
BUN: 18 mg/dL (ref 6–24)
Bilirubin Total: 0.7 mg/dL (ref 0.0–1.2)
CO2: 22 mmol/L (ref 20–29)
Calcium: 9.3 mg/dL (ref 8.7–10.2)
Chloride: 101 mmol/L (ref 96–106)
Creatinine, Ser: 1.21 mg/dL (ref 0.76–1.27)
GFR calc Af Amer: 77 mL/min/{1.73_m2} (ref >59)
GFR calc non Af Amer: 67 mL/min/{1.73_m2} (ref >59)
Globulin, Total: 2.5 g/dL (ref 1.5–4.5)
Glucose: 230 mg/dL — ABNORMAL HIGH (ref 65–99)
Potassium: 4.4 mmol/L (ref 3.5–5.2)
Sodium: 137 mmol/L (ref 134–144)
Total Protein: 7.1 g/dL (ref 6.0–8.5)

## 2019-12-31 LAB — LIPID PANEL W/O CHOL/HDL RATIO
Cholesterol, Total: 150 mg/dL (ref 100–199)
HDL: 40 mg/dL (ref >39)
LDL Chol Calc (NIH): 85 mg/dL (ref 0–99)
Triglycerides: 144 mg/dL (ref 0–149)
VLDL Cholesterol Cal: 25 mg/dL (ref 5–40)

## 2019-12-31 LAB — VITAMIN D 25 HYDROXY (VIT D DEFICIENCY, FRACTURES): Vit D, 25-Hydroxy: 37.2 ng/mL (ref 30.0–100.0)

## 2019-12-31 NOTE — Progress Notes (Signed)
Good morning, please let [name] and his wife [name] know that [place] labs have returned.  Cholesterol levels continue to look good, continue Fenofibrate and Atorvastatin daily.  Vitamin D level is in normal range, continue daily supplement. Kidney function is normal as are liver tests.  I did order abdominal ultrasound yesterday as he is due for his repeat assessment of his abdominal aorta, this was last done in 2016 and it is time for repeat. I see he is scheduled for 01/05/20 and I will let you know when those results return.  Please ensure he sees orthopedics about his knee and next steps.  If any questions let me know.  Have a great day!!

## 2020-01-01 ENCOUNTER — Telehealth: Payer: Self-pay | Admitting: Pharmacist

## 2020-01-01 ENCOUNTER — Telehealth: Payer: Self-pay

## 2020-01-01 NOTE — Chronic Care Management (AMB) (Signed)
Chronic Care Management Pharmacy Assistant   Name: [name].  MRN: [id number] DOB: [birth year]  Reason for Encounter: Disease State  Patient Questions:  1.  Have you seen any other providers since your last visit? No  2.  Any changes in your medicines or health? Yes, Increase Tresiba to 30 units QHS, if fasting BS remain >130 in 3 days then increase to 34 units per Dr. Ned Card, NP.    PCP : [name], NP  Allergies:   Allergies  Allergen Reactions  . Bee Venom Hives    All kinds of bees  . Other     Certain powders  . Sulfa Antibiotics     Medications: Outpatient Encounter Medications as of 01/01/2020  Medication Sig  . acetaminophen (TYLENOL) 325 MG tablet Take 1-2 tablets (325-650 mg total) by mouth every 4 (four) hours as needed for mild pain. (Patient taking differently: Take 325-650 mg by mouth every 4 (four) hours as needed for mild pain. )  . albuterol (PROAIR HFA) 108 (90 Base) MCG/ACT inhaler INHALE 2 PUFFS BY MOUTH EVERY 6 HOURS IF NEEDED FOR COUGH OR WHEEZING.  [name] amoxicillin-clavulanate (AUGMENTIN) 875-125 MG tablet Take 1 tablet by mouth 2 (two) times daily for 7 days.  [name] aspirin 81 MG EC tablet Take 1 tablet (81 mg total) by mouth daily.  [name] atorvastatin (LIPITOR) 80 MG tablet Take 1 tablet (80 mg total) by mouth daily.  . Blood Glucose Monitoring Suppl (ONE TOUCH ULTRA 2) w/Device KIT Use to check blood sugar 4 times a day  . BREO ELLIPTA 100-25 MCG/INH AEPB INHALE 1 PUFF BY MOUTH ONCE DAILY  . clobetasol cream (TEMOVATE) 8.75 % Apply 1 application topically 2 (two) times daily. Apply to affected area  . Dextran 70-Hypromellose (ARTIFICIAL TEARS) 0.1-0.3 % SOLN Apply 1 drop to eye 3 (three) times daily. To right eye only.  . diclofenac Sodium (VOLTAREN) 1 % GEL Apply 2 g topically 3 (three) times daily.  . empagliflozin (JARDIANCE) 25 MG TABS tablet Take 1 tablet (25 mg total) by mouth daily.  . fenofibrate 54 MG tablet Take 1 tablet (54 mg  total) by mouth daily.  [name] gabapentin (NEURONTIN) 300 MG capsule Take 1 capsule (300 mg total) by mouth 2 (two) times daily.  [name] glucose blood (ONETOUCH ULTRA) test strip To check blood sugar three times daily  . Insulin Pen Needle (PEN NEEDLES) 32G X 4 MM MISC 1 kit by Does not apply route daily.  . Lancets (ONETOUCH ULTRASOFT) lancets Use as instructed  . lisinopril (ZESTRIL) 20 MG tablet Take 1 tablet (20 mg total) by mouth daily.  . montelukast (SINGULAIR) 10 MG tablet TAKE 1 TABLET BY MOUTH ONCE EVERY EVENING AT BEDTIME.  . nitroGLYCERIN (NITROSTAT) 0.4 MG SL tablet Place 1 tablet (0.4 mg total) under the tongue every 5 (five) minutes as needed for chest pain. (Patient taking differently: Place 0.4 mg under the tongue every 5 (five) minutes as needed for chest pain. )  . omeprazole (PRILOSEC) 20 MG capsule Take 1 capsule (20 mg total) by mouth daily.  . polyethylene glycol (MIRALAX) 17 g packet Take 17 g by mouth daily.  [name] tiZANidine (ZANAFLEX) 2 MG tablet TAKE 1 TABLET BY MOUTH EVERY 6 HOURS AS NEEDED MUSCLE SPASMS  . TRESIBA FLEXTOUCH 100 UNIT/ML FlexTouch Pen INJECT 22 UNITS SUBQ ONCE DAILY (Patient taking differently: 26 Units. )  . trimethoprim-polymyxin b (POLYTRIM) ophthalmic solution Place 1 drop into the right eye every 6 (  six) hours.   . Vitamin D, Ergocalciferol, (DRISDOL) 1.25 MG (50000 UNIT) CAPS capsule TAKE 1 CAPSULE BY MOUTH ONCE A WEEK   No facility-administered encounter medications on file as of 01/01/2020.    Current Diagnosis: Patient Active Problem List   Diagnosis Date Noted  . Type 2 diabetes mellitus with proteinuria ([place]) 03/25/2019  . Atherosclerosis of both carotid arteries 03/22/2019  . History of CVA (cerebrovascular accident) 03/06/2019  . Slow transit constipation   . Diplopia   . Uncontrolled type 2 diabetes mellitus with hyperglycemia ([place])   . Obesity 09/03/2018  . Skin tags, multiple acquired 08/23/2016  . Frequent falls 05/15/2016  . Vitamin D  deficiency 08/22/2015  . Carpal tunnel syndrome on left 05/12/2015  . External hemorrhoids 12/20/2014  . Ectatic abdominal aorta ([place]) 10/26/2014  . Allergic rhinitis 10/21/2014  . Diverticulosis 10/21/2014  . Hypertension associated with diabetes ([place]) 10/21/2014  . Hyperlipidemia associated with type 2 diabetes mellitus ([place]) 10/21/2014  . Asthma 10/21/2014  . GERD (gastroesophageal reflux disease) 10/21/2014  . NAFLD (nonalcoholic fatty liver disease) 10/21/2014  . Arthritis 10/21/2014  . Chest pain with high risk for cardiac etiology 10/21/2014  . Pancreatitis 10/07/2014  . Phenylketonuria (PKU) ([place]) 10/07/2014  . Renal cyst, left 10/07/2014  . Gall bladder polyp 10/01/2013   Recent Relevant Labs: Lab Results  Component Value Date/Time   HGBA1C 9.4 (H) 12/30/2019 02:35 PM   HGBA1C 8.7 (H) 09/30/2019 02:21 PM   MICROALBUR 10 07/17/2019 10:24 AM   MICROALBUR 80 (H) 03/25/2019 09:16 AM   MICROALBUR 20 08/22/2015 05:04 PM    Kidney Function Lab Results  Component Value Date/Time   CREATININE 1.21 12/30/2019 02:41 PM   CREATININE 1.09 09/30/2019 02:22 PM   GFRNONAA 67 12/30/2019 02:41 PM   GFRAA 77 12/30/2019 02:41 PM    . Current antihyperglycemic regimen:  o Jardiance 20m tablet, Tresiba 32 Units daily.  . What recent interventions/DTPs have been made to improve glycemic control:  o Recommended to check BS 3 times/daily, Increase Tresiba to 30 units, if elevations in morning sugars greater than 130 over the next 3 days then increase to 34 units. Patient's wife confirmed patient is using [place] units daily.    . Have there been any recent hospitalizations or ED visits since last visit with CPP? No   . Patient reports hypoglycemic symptoms, including Shaky, Nervous/irritable and upset stomach, feels like he is going to pass out . Patient reports hyperglycemic symptoms, including blurry vision, excessive thirst, polyuria and weakness . How often are you checking your  blood sugar? 3-4 times daily and in the morning before eating or drinking . What are your blood sugars ranging?  o Fasting: 176 01/01/20, o Before meals: n/a  o After meals: n/a  o Bedtime: n/a . During the week, how often does your blood glucose drop below 70? Never . Are you checking your feet daily/regularly? Patient's wife states patient does not check his daily or on a regular basis. Patient's wife states she will notice if something was wrong with his feet.    Adherence Review: Is the patient currently on a STATIN medication? Yes Is the patient currently on ACE/ARB medication? No Does the patient have >5 day gap between last estimated fill dates? No   Candice L Thomas Goals Addressed              This Visit's Progress   .  PharmD "I want to work on my diabetes" (pt-stated)   On  track     [place] (see longtitudinal plan of care for additional care plan information)  Current Barriers:  . 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 He fell a few a days ago while mowing the grass because he got lightheaded. He saw Dr. Melrose Nakayama in August. Restart Plavix, continue aspirin, refer to dermatology for cyst on left knee. . Current antihyperglycemic regimen: Jardiance 25 mg daily, Tresiba 26 units daily  o Metformin d/c d/t bump in Scr after restarted o Avoiding sulfonylurea d/t sulfa allergy; avoiding GLP1 d/t hx pancreatitis- though therapy could be considered in the future if TG is well controlled, therefore reducing pancreatitis risk . Current dietary patterns: o Using air fryer o Eating more vegetables, fruits lately o Drinking water w/ flavoring packets instead of sodas . Current blood glucose readings: o Wife reports he has been documenting readings, but she doesn't have his book accessiblle  Fastings mostly in 100s  Reports BG was  500 last night around 7 pm. Per wife he was snacking on chips and cookies after they had dinner at 6 PM. Reports he didn't want to go the ED so he drank a lot water and rechecked in three hours and it had gone down to 200. o Wife reports he lost the top to the lancet device so they have just been using the lancet. . Cardiovascular risk reduction (follows w/ Dr. Laurelyn Sickle);  o Current hypertensive regimen: lisinopril 10 mg daily  o Current hyperlipidemia regimen: atorvastatin 80 mg; fenofibrate 54 mg daily, ezetimibe 10 mg daily; Last LDL 79, goal < 70, TG slightly elevated  o Current antiplatelet regimen: ASA 81 mg daily;clopidogrel 75 mg daily . Chronic kidney disease: follows w/ [place],  . S/p CVA: follows w/ Dr. Melrose Nakayama. Last appt 11/09/19 -restarted clopidogel 75 mg daily . Allergies/asthma: albuterol PRN wheezing/SOB; Breo 100/25 mcg started at last visit; loratidine 10 mg daily, montelukast 10 mg daily . Health maintenance: Vitamin D 50,000 IU weekly Q Monday  Pharmacist Clinical Goal(s):  [name] Over the next 90 days, patient with work with PharmD and primary care provider to address optimized medication management  Interventions: . Comprehensive medication review performed, medication list updated in electronic medical record . Inter-disciplinary care team collaboration (see longitudinal plan of care) . Reviewed goal A1c, goal fasting glucose, and goal 2 hour post prandial glucose. Praised wife for her support of [place] and improved dietary choices, improvement in blood sugars. Reminded her to bring Yusif's blood sugar readings to appointments, or send with Treyshawn if she is unable to make the appointment.  . Reviewed appropriate tx for symptomatic hypoglycemia (ie, small amount of sugar)  Patient Self Care Activities:  . Patient will check blood glucose daily , document, and provide at future appointments . Patient will take medications as prescribed . Patient will report any questions or  concerns to provider   Please see past updates related to this goal by clicking on the "Past Updates" button in the selected goal         Follow-Up:  Pharmacist Review-Spoke with patient's wife; she states patient is improving his daily meals by eating lite and adding no salt substitute to meals. Patient typical meals include chicken, fish, green beans, drinking lots of water with adding zero calorie water enhancer. Patient finds himself drinking more than usual and complains of throat feeling dry. Patient's wife  ensured patient is taking his Plavix as well as his other medications. Patient voiced he is experiencing painful urination however he does drink plenty of water but it is not helping.  Patient is using OneTouch Ultra Lancet device but lid does not stay on and would like to get a new device. Patient is inquiring to receive a knee brace if insurance will cover it, he usually gets medical neccessities out of [facility] in [place], [place] Bird[place], CPP notified.

## 2020-01-01 NOTE — Telephone Encounter (Signed)
Pt stated he needed the brace that wraps around his knee as that one is easier for him to use. Stated the two straps.

## 2020-01-01 NOTE — Telephone Encounter (Signed)
Pt stated with his insurance it is better for him to go to [facility] imaging due to stating he wouldn't have a copay there , he is scheduled at [facility] and stated they charge him a copay.

## 2020-01-04 NOTE — Telephone Encounter (Signed)
Request has been sent to [facility].

## 2020-01-04 NOTE — Telephone Encounter (Signed)
Can you assist in writing script for this knee brace we can send to [facility] per patient request.  Thank you:)

## 2020-01-04 NOTE — Telephone Encounter (Signed)
RX signed and faxed to [facility].

## 2020-01-04 NOTE — Telephone Encounter (Signed)
Order written and placed in providers folder for signature.

## 2020-01-05 ENCOUNTER — Ambulatory Visit: Payer: Medicare Other

## 2020-01-11 ENCOUNTER — Telehealth: Payer: Self-pay | Admitting: Nurse Practitioner

## 2020-01-11 NOTE — Telephone Encounter (Signed)
Called to ask the nurse or doctor to verify an order for an ultrasound of the abdomen.  Please call to discuss at [phone]

## 2020-01-11 NOTE — Telephone Encounter (Signed)
Called and spoke with Jerline Pain. She is going to change the order and have [employer].

## 2020-01-11 NOTE — Telephone Encounter (Signed)
Called and spoke to [place]. She is wanting to know if we actually need a complete abdomin US or if we just need an Aorta Ultrasound since it is just following up and checking for aneurysm. Please advise.

## 2020-01-11 NOTE — Telephone Encounter (Signed)
Just aorta please, if they will change order I can sign.  Thank you.

## 2020-01-11 NOTE — Telephone Encounter (Signed)
Please advise

## 2020-01-12 ENCOUNTER — Other Ambulatory Visit: Payer: Self-pay

## 2020-01-12 ENCOUNTER — Ambulatory Visit
Admission: RE | Admit: 2020-01-12 | Discharge: 2020-01-12 | Disposition: A | Discharge status: Home / Self Care | Payer: Medicare Other | Source: Ambulatory Visit | Attending: Nurse Practitioner | Admitting: Nurse Practitioner

## 2020-01-12 DIAGNOSIS — I77811 Abdominal aortic ectasia: Secondary | ICD-10-CM | POA: Diagnosis not present

## 2020-01-12 DIAGNOSIS — E785 Hyperlipidemia, unspecified: Secondary | ICD-10-CM | POA: Diagnosis not present

## 2020-01-12 NOTE — Progress Notes (Signed)
Good afternoon, please let Saunders and his wife know that abdominal ultrasound returned showing normal measurements and no plaque build-up.  This is great news and they report no additional routine ultrasound is needed.  Have a great day!!

## 2020-01-13 ENCOUNTER — Telehealth: Payer: Self-pay

## 2020-01-13 NOTE — Telephone Encounter (Signed)
Called and LVM (verified DPR) advising patient of normal [name] results. See result note in chart.

## 2020-01-13 NOTE — Telephone Encounter (Signed)
Copied from [place] [id number]. Topic: General - Call Back - No Documentation >> Jan 13, 2020  1:24 PM Erick Blinks wrote: Reason for CRM: Pt called reporting that he missed a call from the office. Please advise, he had an ultrasound

## 2020-01-25 DIAGNOSIS — E1129 Type 2 diabetes mellitus with other diabetic kidney complication: Secondary | ICD-10-CM | POA: Diagnosis not present

## 2020-01-25 DIAGNOSIS — I10 Essential (primary) hypertension: Secondary | ICD-10-CM | POA: Diagnosis not present

## 2020-01-25 DIAGNOSIS — R809 Proteinuria, unspecified: Secondary | ICD-10-CM | POA: Diagnosis not present

## 2020-01-26 ENCOUNTER — Telehealth: Payer: Self-pay

## 2020-01-26 NOTE — Telephone Encounter (Signed)
Please let [name] know I did review results in chart and nephrology should reach out to him.  Overall no infection seen.  Kidney function on blood work remains stable, but does continue to have some protein in urine which we will continue current medications for that offer kidney protection.  No changes needs unless advised by kidney doctor.  Have a good day!!

## 2020-01-26 NOTE — Telephone Encounter (Signed)
Copied from [place] [id number]. Topic: General - Other >> Jan 26, 2020  9:32 AM Leward Quan A wrote: Reason for CRM: Patient call to say that his kidney Dr will be calling in some results and he would like a call to discuss their findings please at Ph# [phone] or [phone]

## 2020-01-26 NOTE — Telephone Encounter (Signed)
Patient's wife notified of Jolene's message.

## 2020-01-27 ENCOUNTER — Ambulatory Visit: Payer: Medicare Other | Admitting: Nurse Practitioner

## 2020-02-03 ENCOUNTER — Other Ambulatory Visit: Payer: Self-pay | Admitting: Nurse Practitioner

## 2020-02-03 MED ORDER — GABAPENTIN 300 MG PO CAPS
300.0000 mg | ORAL_CAPSULE | Freq: Two times a day (BID) | ORAL | 1 refills | Status: DC
Start: 2020-02-03 — End: 2020-03-20

## 2020-02-03 MED ORDER — EMPAGLIFLOZIN 25 MG PO TABS
25.0000 mg | ORAL_TABLET | Freq: Every day | ORAL | 1 refills | Status: DC
Start: 2020-02-03 — End: 2020-10-13

## 2020-02-03 NOTE — Telephone Encounter (Signed)
Pt request refill   empagliflozin (JARDIANCE) 25 MG TABS tablet  gabapentin (NEURONTIN) 300 MG capsule  [facility] [place], [place]

## 2020-02-08 ENCOUNTER — Ambulatory Visit: Payer: Self-pay | Admitting: Pharmacist

## 2020-02-08 DIAGNOSIS — I152 Hypertension secondary to endocrine disorders: Secondary | ICD-10-CM

## 2020-02-08 DIAGNOSIS — E1159 Type 2 diabetes mellitus with other circulatory complications: Secondary | ICD-10-CM

## 2020-02-08 DIAGNOSIS — E1165 Type 2 diabetes mellitus with hyperglycemia: Secondary | ICD-10-CM

## 2020-02-08 NOTE — Chronic Care Management (AMB) (Signed)
Chronic Care Management Pharmacy  Name: [name].  MRN: [id number] DOB: 04-19-1960   Chief Complaint/ HPI  Alex Metro.,  59 y.o. , male presents for their Follow-Up CCM visit with the clinical pharmacist via telephone due to COVID-19 Pandemic. Spoke with wife [name].  PCP : [name], NP Patient Care Team: [name], NP as PCP - General (Nurse Practitioner) Anell Barr, OD (Optometry) Dionisio David, MD as Consulting Physician (Cardiology) Greg Cutter, LCSW as Social Worker (Licensed Clinical Social Worker) Hall Busing, Nobie Putnam, RN as Case Manager ([place]) Vladimir Faster, [facility] as Pharmacist (Pharmacist)  Their chronic conditions include: Hypertension, Hyperlipidemia, Diabetes, Coronary Artery Disease, GERD, COPD and Allergic Rhinitis   Office Visits: 12/30/19- Marnee Guarneri, NP- blood work, increased Tyler Aas, abd [name] for abd aorta assessmetn follow up 09/30/19- Marnee Guarneri, NP- bloodwork, Diclofenac topical, tizanidine for muscle spasms  Consult Visit:knee 01/25/20- Dr. Juleen China, Nephrology- lab work 11/09/19-Dr. Melrose Nakayama, Neurology - restart Plavix, referral to dermatology for cyst eval on    Allergies  Allergen Reactions  . Bee Venom Hives    All kinds of bees  . Other     Certain powders  . Sulfa Antibiotics     Medications: Outpatient Encounter Medications as of 02/08/2020  Medication Sig  . albuterol (PROAIR HFA) 108 (90 Base) MCG/ACT inhaler INHALE 2 PUFFS BY MOUTH EVERY 6 HOURS IF NEEDED FOR COUGH OR WHEEZING.  [name] aspirin 81 MG EC tablet Take 1 tablet (81 mg total) by mouth daily.  [name] atorvastatin (LIPITOR) 80 MG tablet Take 1 tablet (80 mg total) by mouth daily.  . Blood Glucose Monitoring Suppl (ONE TOUCH ULTRA 2) w/Device KIT Use to check blood sugar 4 times a day  . clobetasol cream (TEMOVATE) 9.16 % Apply 1 application topically 2 (two) times daily. Apply to affected area  . Dextran 70-Hypromellose  (ARTIFICIAL TEARS) 0.1-0.3 % SOLN Apply 1 drop to eye 3 (three) times daily. To right eye only.  . diclofenac Sodium (VOLTAREN) 1 % GEL Apply 2 g topically 3 (three) times daily.  . empagliflozin (JARDIANCE) 25 MG TABS tablet Take 1 tablet (25 mg total) by mouth daily.  . fenofibrate 54 MG tablet Take 1 tablet (54 mg total) by mouth daily.  [name] gabapentin (NEURONTIN) 300 MG capsule Take 1 capsule (300 mg total) by mouth 2 (two) times daily.  [name] glucose blood (ONETOUCH ULTRA) test strip To check blood sugar three times daily  . Insulin Pen Needle (PEN NEEDLES) 32G X 4 MM MISC 1 kit by Does not apply route daily.  . Lancets (ONETOUCH ULTRASOFT) lancets Use as instructed  . lisinopril (ZESTRIL) 20 MG tablet Take 1 tablet (20 mg total) by mouth daily.  . montelukast (SINGULAIR) 10 MG tablet TAKE 1 TABLET BY MOUTH ONCE EVERY EVENING AT BEDTIME.  . nitroGLYCERIN (NITROSTAT) 0.4 MG SL tablet Place 1 tablet (0.4 mg total) under the tongue every 5 (five) minutes as needed for chest pain. (Patient taking differently: Place 0.4 mg under the tongue every 5 (five) minutes as needed for chest pain. )  . omeprazole (PRILOSEC) 20 MG capsule Take 1 capsule (20 mg total) by mouth daily.  . polyethylene glycol (MIRALAX) 17 g packet Take 17 g by mouth daily.  [name] tiZANidine (ZANAFLEX) 2 MG tablet TAKE 1 TABLET BY MOUTH EVERY 6 HOURS AS NEEDED MUSCLE SPASMS  . TRESIBA FLEXTOUCH 100 UNIT/ML FlexTouch Pen INJECT 22 UNITS SUBQ ONCE DAILY (Patient taking differently:  32 Units. )  . Vitamin D, Ergocalciferol, (DRISDOL) 1.25 MG (50000 UNIT) CAPS capsule TAKE 1 CAPSULE BY MOUTH ONCE A WEEK  . acetaminophen (TYLENOL) 325 MG tablet Take 1-2 tablets (325-650 mg total) by mouth every 4 (four) hours as needed for mild pain. (Patient taking differently: Take 325-650 mg by mouth every 4 (four) hours as needed for mild pain. )  . BREO ELLIPTA 100-25 MCG/INH AEPB INHALE 1 PUFF BY MOUTH ONCE DAILY  . trimethoprim-polymyxin b (POLYTRIM)  ophthalmic solution Place 1 drop into the right eye every 6 (six) hours.    No facility-administered encounter medications on file as of 02/08/2020.    Wt Readings from Last 3 Encounters:  12/30/19 178 lb 6.4 oz (80.9 kg)  09/30/19 177 lb (80.3 kg)  09/02/19 176 lb 6.4 oz (80 kg)    Current Diagnosis/Assessment:  SDOH Interventions     Most Recent Value  SDOH Interventions  Financial Strain Interventions Other (Comment)  [continued care guide support]      Goals Addressed            This Visit's Progress   . Pharmacy Care Plan       CARE PLAN ENTRY (see longitudinal plan of care for additional care plan information)  Current Barriers:  . Chronic Disease Management support, education, and care coordination needs related to Hypertension, Hyperlipidemia, Diabetes, Coronary Artery Disease, GERD, COPD, and Allergic Rhinitis   Hypertension BP Readings from Last 3 Encounters:  12/30/19 112/71  09/30/19 118/79  09/02/19 111/69   . Pharmacist Clinical Goal(s): o Over the next 60 days, patient will work with PharmD and providers to maintain BP goal <130/80 . Current regimen:  o Lisinopril 20 mg qd . Interventions:  Reviewed proper BP technique including sitting down for at least 5 minutes before, resting calmly, with your feet flat on the floor.   o Provided diet and exercise counseling. . Patient self care activities - Over the next60 days, patient will: o Check BP daily, document, and provide at future appointments o Ensure daily salt intake < 2300 mg/day   Diabetes Lab Results  Component Value Date/Time   HGBA1C 9.4 (H) 12/30/2019 02:35 PM   HGBA1C 8.7 (H) 09/30/2019 02:21 PM   . Pharmacist Clinical Goal(s): o Over the next 60 days, patient will work with PharmD and providers to achieve A1c goal <6.5% . Current regimen:  o Tresiba 32 units daily o Jardiance 25 mg daily . Interventions: o Provided diet and exercise counseling. o Reviewed goal glucose readings  for an A1c of <7%, we want to see fasting sugars <130 and 2 hour after meal sugars <180.  o Reviewed last PCP note with instruction to increase Tresiba dose. o Will mail additional dietary educational material. . Patient self care activities - Over the next 60 days, patient will: o Check blood sugar twice daily, document, and provide at future appointments o Contact provider with any episodes of hypoglycemia o Focus on DASH diet and increasing exercise o Atttend all scheduled appointments   Medication management . Pharmacist Clinical Goal(s): o Over the next 60 days, patient will work with PharmD and providers to achieve optimal medication adherence . Current pharmacy: [facility]  . Interventions o Comprehensive medication review performed. o Continue current medication management strategy . Patient self care activities - Over the next 60 days, patient will: o Focus on medication adherence by fill dates. o Take medications as prescribed o Report any questions or concerns to PharmD and/or provider(s)  Initial goal documentation        Diabetes   A1c goal <6.5%  Recent Relevant Labs: Lab Results  Component Value Date/Time   HGBA1C 9.4 (H) 12/30/2019 02:35 PM   HGBA1C 8.7 (H) 09/30/2019 02:21 PM   MICROALBUR 10 07/17/2019 10:24 AM   MICROALBUR 80 (H) 03/25/2019 09:16 AM   MICROALBUR 20 08/22/2015 05:04 PM    Last diabetic Eye exam:  Lab Results  Component Value Date/Time   HMDIABEYEEXA No Retinopathy 07/01/2017 12:00 AM    Last diabetic Foot exam: No results found for: HMDIABFOOTEX   Checking BG: Daily  Recent FBG Readings: 200-300s Recent 2hr PP BG readings:  600 several nights ago  (unsure if this is true   Patient has failed these meds in past: glipizide, Invokana,metfomin Patient is currently uncontrolled on the following medications: . Tyler Aas 32 units daily . Jardiance 25 mg daily  We discussed: diet and exercise extensively and how to recognize and  treat signs of hypoglycemia. Wife states they have been drinking water and given up soda. Reviewed goal glucose readings for an A1c of <7%, we want to see fasting sugars <130 and 2 hour after meal sugars <180. [name] states he is making poor dietary choices including sneaking salt, nabs and canned peaches. She reports he eats pasta and bread. Reviewed last note from PCP regarding increasing Tresiba dose if FBG >130 for 3 days. Encouraged [name] to review his BG log. Will mail additional diabetic nutrition information per patient request.   Plan  Continue current medications   Hypertension   BP goal is:  <130/80  Office blood pressures are  BP Readings from Last 3 Encounters:  12/30/19 112/71  09/30/19 118/79  09/02/19 111/69   BMP Latest Ref Rng & Units 12/30/2019 09/30/2019 07/17/2019  Glucose 65 - 99 mg/dL 230(H) 236(H) 253(H)  BUN 6 - 24 mg/dL _0 Creatinine 0.76 - 1.27 mg/dL 1.21 1.09 1.20  BUN/Creat Ratio 9 - _1 Sodium 134 - 144 mmol/L 137 143 138  Potassium 3.5 - 5.2 mmol/L 4.4 4.2 4.2  Chloride 96 - 106 mmol/L 101 109(H) 105  CO2 20 - 29 mmol/L 22 20 19(L)  Calcium 8.7 - 10.2 mg/dL 9.3 9.3 9.1    Patient checks BP at home 3-5x per week Patient home BP readings are ranging: < 130/80  Patient has failed these meds in the past: Unavailable from chart Patient is currently controlled on the following medications:  . Lisinopril 20 mg daily  We discussed diet and exercise extensively. Wife [name] reports he has been sneaking salt. Abdominal ultrasound for aortic aneurysm  assessment  Normal with no athersclerotic plaque. No need for routine follow-up.  Plan  Continue current medications        Medication Management   Pt uses [facility] for all medications Uses pill box? Yes  Plan  Continue current medication management strategy    Follow up: 2 month phone visit. Pharmacy team to reach out in 2-4 weeks.  Junita Push. Kenton Kingfisher PharmD,  [facility] [phone]

## 2020-02-08 NOTE — Patient Instructions (Addendum)
Visit Information: [name] and [name], It was a pleasure speaking with you today.  Please look over the enclosed diet material and let me know if you have questions. Keep checking sugar every morning and 2-3 hours after evening meal. Please write these down so we can discuss in a couple weeks. Have a great birthday,  [name]! Thank you for letting me be part of your clinical team. Please call with any questions or concerns.   Junita Push. Kenton Kingfisher PharmD, BCPS Clinical Pharmacist [facility] [phone]  Goals Addressed            This Visit's Progress   . Pharmacy Care Plan       CARE PLAN ENTRY (see longitudinal plan of care for additional care plan information)  Current Barriers:  . Chronic Disease Management support, education, and care coordination needs related to Hypertension, Hyperlipidemia, Diabetes, Coronary Artery Disease, GERD, COPD, and Allergic Rhinitis   Hypertension BP Readings from Last 3 Encounters:  12/30/19 112/71  09/30/19 118/79  09/02/19 111/69   . Pharmacist Clinical Goal(s): o Over the next 60 days, patient will work with PharmD and providers to maintain BP goal <130/80 . Current regimen:  o Lisinopril 20 mg qd . Interventions:  Reviewed proper BP technique including sitting down for at least 5 minutes before, resting calmly, with your feet flat on the floor.   o Provided diet and exercise counseling. . Patient self care activities - Over the next60 days, patient will: o Check BP daily, document, and provide at future appointments o Ensure daily salt intake < 2300 mg/day   Diabetes Lab Results  Component Value Date/Time   HGBA1C 9.4 (H) 12/30/2019 02:35 PM   HGBA1C 8.7 (H) 09/30/2019 02:21 PM   . Pharmacist Clinical Goal(s): o Over the next 60 days, patient will work with PharmD and providers to achieve A1c goal <6.5% . Current regimen:  o Tresiba 32 units daily o Jardiance 25 mg daily . Interventions: o Provided diet and exercise  counseling. o Reviewed goal glucose readings for an A1c of <7%, we want to see fasting sugars <130 and 2 hour after meal sugars <180.  o Reviewed last PCP note with instruction to increase Tresiba dose. o Will mail additional dietary educational material. . Patient self care activities - Over the next 60 days, patient will: o Check blood sugar twice daily, document, and provide at future appointments o Contact provider with any episodes of hypoglycemia o Focus on DASH diet and increasing exercise o Atttend all scheduled appointments   Medication management . Pharmacist Clinical Goal(s): o Over the next 60 days, patient will work with PharmD and providers to achieve optimal medication adherence . Current pharmacy: [facility]  . Interventions o Comprehensive medication review performed. o Continue current medication management strategy . Patient self care activities - Over the next 60 days, patient will: o Focus on medication adherence by fill dates. o Take medications as prescribed o Report any questions or concerns to PharmD and/or provider(s)  Initial goal documentation        The patient verbalized understanding of instructions, educational materials, and care plan provided today and agreed to receive a mailed copy of patient instructions, educational materials, and care plan.   Telephone follow up appointment with pharmacy team member scheduled for: 2 months  Junita Push. Kenton Kingfisher PharmD, BCPS Clinical Pharmacist [phone] Daily Diabetes Record Check your blood glucose (BG) as directed by your health care provider. Use this form to record your BG results as  well as any diabetes medicines that you take, including insulin. Bringing a record of your BG results and a list of your current medicines to your health care provider is very helpful in managing your diabetes. These numbers help your health care provider to know whether your diabetes management plan needs to be  changed. Patient name: ____________________________________ Week of ____________________ Daily BG results and diabetes medicines Date: _________  Breakfast - BG / Medicines: ________________ / __________________________________________________________  Lunch - BG / Medicines: ___________________ / __________________________________________________________  Dinner - BG / Medicines: __________________ / __________________________________________________________  Bedtime - BG / Medicines: ________________ / ___________________________________________________________ Date: _________  Breakfast - BG / Medicines: ________________ / __________________________________________________________  Lunch - BG / Medicines: ___________________ / __________________________________________________________  Dinner - BG / Medicines: __________________ / __________________________________________________________  Bedtime - BG / Medicines: ________________ / ___________________________________________________________ Date: _________  Breakfast - BG / Medicines: ________________ / __________________________________________________________  Lunch - BG / Medicines: ___________________ / __________________________________________________________  Dinner - BG / Medicines: __________________ / __________________________________________________________  Bedtime - BG / Medicines: ________________ / ___________________________________________________________ Date: _________  Breakfast - BG / Medicines: ________________ / __________________________________________________________  Lunch - BG / Medicines: ___________________ / __________________________________________________________  Dinner - BG / Medicines: __________________ / __________________________________________________________  Bedtime - BG / Medicines: ________________ / ___________________________________________________________ Date:  _________  Breakfast - BG / Medicines: ________________ / __________________________________________________________  Lunch - BG / Medicines: ___________________ / __________________________________________________________  Dinner - BG / Medicines: __________________ / __________________________________________________________  Bedtime - BG / Medicines: ________________ / ___________________________________________________________ Date: _________  Breakfast - BG / Medicines: ________________ / __________________________________________________________  Lunch - BG / Medicines: ___________________ / __________________________________________________________  Dinner - BG / Medicines: __________________ / __________________________________________________________  Bedtime - BG / Medicines: ________________ / ___________________________________________________________ Date: _________  Breakfast - BG / Medicines: ________________ / __________________________________________________________  Lunch - BG / Medicines: ___________________ / __________________________________________________________  Dinner - BG / Medicines: __________________ / __________________________________________________________  Bedtime - BG / Medicines: ________________ / ___________________________________________________________ Notes: ______________________________________________________________________________________________________________________ This information is not intended to replace advice given to you by your health care provider. Make sure you discuss any questions you have with your health care provider. Document Revised: 12/17/2017 Document Reviewed: 12/02/2015 Elsevier Patient Education  [place]. Daily Diabetes Record Check your blood glucose (BG) as directed by your health care provider. Use this form to record your BG results as well as any diabetes medicines that you take, including  insulin. Bringing a record of your BG results and a list of your current medicines to your health care provider is very helpful in managing your diabetes. These numbers help your health care provider to know whether your diabetes management plan needs to be changed. Patient name: ____________________________________ Week of ____________________ Daily BG results and diabetes medicines Date: _________  Breakfast - BG / Medicines: ________________ / __________________________________________________________  Lunch - BG / Medicines: ___________________ / __________________________________________________________  Dinner - BG / Medicines: __________________ / __________________________________________________________  Bedtime - BG / Medicines: ________________ / ___________________________________________________________ Date: _________  Breakfast - BG / Medicines: ________________ / __________________________________________________________  Lunch - BG / Medicines: ___________________ / __________________________________________________________  Dinner - BG / Medicines: __________________ / __________________________________________________________  Bedtime - BG / Medicines: ________________ / ___________________________________________________________ Date: _________  Breakfast - BG / Medicines: ________________ / __________________________________________________________  Lunch - BG / Medicines: ___________________ / __________________________________________________________  Dinner - BG / Medicines: __________________ / __________________________________________________________  Bedtime - BG / Medicines: ________________ / ___________________________________________________________ Date: _________  Breakfast - BG / Medicines: ________________ / __________________________________________________________  Lunch - BG / Medicines:  ___________________ /  __________________________________________________________  Dinner - BG / Medicines: __________________ / __________________________________________________________  Bedtime - BG / Medicines: ________________ / ___________________________________________________________ Date: _________  Breakfast - BG / Medicines: ________________ / __________________________________________________________  Lunch - BG / Medicines: ___________________ / __________________________________________________________  Dinner - BG / Medicines: __________________ / __________________________________________________________  Bedtime - BG / Medicines: ________________ / ___________________________________________________________ Date: _________  Breakfast - BG / Medicines: ________________ / __________________________________________________________  Lunch - BG / Medicines: ___________________ / __________________________________________________________  Dinner - BG / Medicines: __________________ / __________________________________________________________  Bedtime - BG / Medicines: ________________ / ___________________________________________________________ Date: _________  Breakfast - BG / Medicines: ________________ / __________________________________________________________  Lunch - BG / Medicines: ___________________ / __________________________________________________________  Dinner - BG / Medicines: __________________ / __________________________________________________________  Bedtime - BG / Medicines: ________________ / ___________________________________________________________ Notes: ______________________________________________________________________________________________________________________ This information is not intended to replace advice given to you by your health care provider. Make sure you discuss any questions you have with your health care provider. Document Revised:  12/17/2017 Document Reviewed: 12/02/2015 Elsevier Patient Education  [place].

## 2020-02-18 ENCOUNTER — Telehealth: Payer: Self-pay

## 2020-02-18 NOTE — Telephone Encounter (Signed)
Copied from [place] [id number]. Topic: General - Inquiry >> Feb 18, 2020 10:01 AM Greggory Keen D wrote: Getting red and oushed out a little more than usual  He would like a nurse to call back.  CB#  [phone] >> Feb 18, 2020 10:06 AM Greggory Keen D wrote: CRM did nit pick up all of the message regarding the patients hernia and that it is sticking out a little more than usual.  He just wants to know if he should be seen earlier than his appt dec 8

## 2020-02-22 ENCOUNTER — Ambulatory Visit: Payer: Medicare Other | Admitting: Licensed Clinical Social Worker

## 2020-02-22 DIAGNOSIS — I152 Hypertension secondary to endocrine disorders: Secondary | ICD-10-CM

## 2020-02-22 DIAGNOSIS — J4540 Moderate persistent asthma, uncomplicated: Secondary | ICD-10-CM

## 2020-02-22 DIAGNOSIS — E785 Hyperlipidemia, unspecified: Secondary | ICD-10-CM

## 2020-02-22 DIAGNOSIS — E1129 Type 2 diabetes mellitus with other diabetic kidney complication: Secondary | ICD-10-CM

## 2020-02-22 DIAGNOSIS — E1159 Type 2 diabetes mellitus with other circulatory complications: Secondary | ICD-10-CM

## 2020-02-22 DIAGNOSIS — E1169 Type 2 diabetes mellitus with other specified complication: Secondary | ICD-10-CM

## 2020-02-22 NOTE — Chronic Care Management (AMB) (Signed)
Chronic Care Management    Clinical Social Work Follow Up Note  02/22/2020 Name: [name]. MRN: [id number] DOB: Sep 19, 1960  Alex Metro. is a 59 y.o. year old male who is a primary care patient of Cannady, Barbaraann Faster, NP. The CCM team was consulted for assistance with Mental Health Counseling and Resources.   Review of patient status, including review of consultants reports, other relevant assessments, and collaboration with appropriate care team members and the patient's provider was performed as part of comprehensive patient evaluation and provision of chronic care management services.    SDOH (Social Determinants of Health) assessments performed: Yes    Outpatient Encounter Medications as of 02/22/2020  Medication Sig  . acetaminophen (TYLENOL) 325 MG tablet Take 1-2 tablets (325-650 mg total) by mouth every 4 (four) hours as needed for mild pain. (Patient taking differently: Take 325-650 mg by mouth every 4 (four) hours as needed for mild pain. )  . albuterol (PROAIR HFA) 108 (90 Base) MCG/ACT inhaler INHALE 2 PUFFS BY MOUTH EVERY 6 HOURS IF NEEDED FOR COUGH OR WHEEZING.  [name] aspirin 81 MG EC tablet Take 1 tablet (81 mg total) by mouth daily.  [name] atorvastatin (LIPITOR) 80 MG tablet Take 1 tablet (80 mg total) by mouth daily.  . Blood Glucose Monitoring Suppl (ONE TOUCH ULTRA 2) w/Device KIT Use to check blood sugar 4 times a day  . BREO ELLIPTA 100-25 MCG/INH AEPB INHALE 1 PUFF BY MOUTH ONCE DAILY  . clobetasol cream (TEMOVATE) 4.00 % Apply 1 application topically 2 (two) times daily. Apply to affected area  . Dextran 70-Hypromellose (ARTIFICIAL TEARS) 0.1-0.3 % SOLN Apply 1 drop to eye 3 (three) times daily. To right eye only.  . diclofenac Sodium (VOLTAREN) 1 % GEL Apply 2 g topically 3 (three) times daily.  . empagliflozin (JARDIANCE) 25 MG TABS tablet Take 1 tablet (25 mg total) by mouth daily.  . fenofibrate 54 MG tablet Take 1 tablet (54 mg total) by mouth daily.    [name] gabapentin (NEURONTIN) 300 MG capsule Take 1 capsule (300 mg total) by mouth 2 (two) times daily.  [name] glucose blood (ONETOUCH ULTRA) test strip To check blood sugar three times daily  . Insulin Pen Needle (PEN NEEDLES) 32G X 4 MM MISC 1 kit by Does not apply route daily.  . Lancets (ONETOUCH ULTRASOFT) lancets Use as instructed  . lisinopril (ZESTRIL) 20 MG tablet Take 1 tablet (20 mg total) by mouth daily.  . montelukast (SINGULAIR) 10 MG tablet TAKE 1 TABLET BY MOUTH ONCE EVERY EVENING AT BEDTIME.  . nitroGLYCERIN (NITROSTAT) 0.4 MG SL tablet Place 1 tablet (0.4 mg total) under the tongue every 5 (five) minutes as needed for chest pain. (Patient taking differently: Place 0.4 mg under the tongue every 5 (five) minutes as needed for chest pain. )  . omeprazole (PRILOSEC) 20 MG capsule Take 1 capsule (20 mg total) by mouth daily.  . polyethylene glycol (MIRALAX) 17 g packet Take 17 g by mouth daily.  [name] tiZANidine (ZANAFLEX) 2 MG tablet TAKE 1 TABLET BY MOUTH EVERY 6 HOURS AS NEEDED MUSCLE SPASMS  . TRESIBA FLEXTOUCH 100 UNIT/ML FlexTouch Pen INJECT 22 UNITS SUBQ ONCE DAILY (Patient taking differently: 32 Units. )  . trimethoprim-polymyxin b (POLYTRIM) ophthalmic solution Place 1 drop into the right eye every 6 (six) hours.   . Vitamin D, Ergocalciferol, (DRISDOL) 1.25 MG (50000 UNIT) CAPS capsule TAKE 1 CAPSULE BY MOUTH ONCE A WEEK   No facility-administered encounter  medications on file as of 02/22/2020.   Patient Care Plan: General Social Work (Adult)    Problem Identified: Coping Skills (General Plan of Care)     Goal: Coping Skills Enhanced   Start Date: 02/22/2020  Note:   Evidence-based guidance:   Acknowledge, normalize and validate difficulty of making life-long lifestyle changes.   Identify current effective and ineffective coping strategies.   Encourage patient and caregiver participation in care to increase self-esteem, confidence and feelings of control.   Consider  alternative and complementary therapy approaches such as meditation, mindfulness or yoga.   Encourage participation in cognitive behavioral therapy to foster a positive identity, increase self-awareness, as well as bolster self-esteem, confidence and self-efficacy.   Discuss spirituality; be present as concerns are identified; encourage journaling, prayer, worship services, meditation or pastoral counseling.   Encourage participation in pleasurable group activities such as hobbies, singing, sports or volunteering).   Encourage the use of mindfulness; refer for training or intensive intervention.   Consider the use of meditative movement therapy such as tai chi, yoga or qigong.   Promote a regular daily exercise program based on tolerance, ability and patient choice to support positive thinking about disease or aging.   Notes:   Current Barriers:  . Financial constraints . Limited social support . Level of care concerns . ADL IADL limitations . Literacy concerns . Cognitive Deficits . Lacks knowledge of community resource: available financial support resources within the area as well as socialization opportunities within the area  Clinical Social Work Clinical Goal(s):  [name] Over the next 120 days, client will work with SW to address concerns related to improving self-care and implementing it whenever able  Interventions: . Patient interviewed and appropriate assessments performed . Family is interested in getting food stamps and received a letter in the mail that they will inquire about. Patient has an upcoming financial assistance appointment with [facility] on 03/08/20.  [name] Patient reports having a cyst on his knee that may need removing. Patient states that this cyst is causing him pain and irritation. Family reported that patient will be scheduling a nearby procedure for his hernia as well.  . Provided mental health counseling with regard to managing everyday stressors and  implementing appropriate self-care techniques in order to improve overall health. LCSW provided reflective listening and implemented appropriate interventions to help suppport patient and his emotional needs. Patient admitted anxiety today due to high blood sugar levels. However, patient checked his blood sugar level before eating anything today. LCSW provided education on healthy nutrition that will improve his self-care and anxiety management.  . Provided patient with information about OTC benefits that he is uses per quarter. Patient's spouse reports that she has started to order diabetic lotion for patient and this is helping him immensely. . Patient reports trying to implement appropriate self-care by exercise and mowing his yard. Patient reports ongoing chronic pain with his knees and side.  . Discussed plans with patient for ongoing care management follow up and provided patient with direct contact information for care management team . Advised patient to contact CCM embedded practice providers and PCP if any urgent concerns arise. [name] LCSW completed referral for a wheelchair ramp in the past and ramp installation process has been completed.  . Patient reports ongoing chronic pain and difficulty managing this without becoming irritated or ill. Patient was alert, oriented. Patient denies anxiety at this time. Discussed coping skills. SW used active and reflective listening, validated patient's feelings/concerns, and provided emotional  support. . Assisted patient/caregiver with obtaining information about health plan benefits . Provided education to patient/caregiver regarding level of care options. . Patient and spouse put up christmas decorations this week in remembrance of their lost family members that passed recently. Positive reinforcement provided to family for using their creativity within their home.  . Brief self-care education provided to both patient and spouse during outreach today.  Positive reinforcement provided for positive self-care implementation.  . Patient has upcoming office visit with PCP on 02/24/20. Family report stable transportation to this appointment.  . Per spouse, patient has worked hard at changing his eating/drinking habits. Patient is no longer drinking soda and drinks a water all day long (with 0 calorie flavor sweeteners that they buy).  . Patient has experienced a few days of high blood sugars (404 today) especially when he has a low appetite and has trouble drinking/eating appropriately. . Patient's dad is currently working on transferring car title from his name to [facility] which family is happy about.   Patient Self Care Activities:  . Attends all scheduled provider appointments . Calls provider office for new concerns or questions  Please see past updates related to this goal by clicking on the "Past Updates" button in the selected goal     Task: Support Psychosocial Response to Risk or Actual Health Condition   Note:   Care Management Activities:    - active listening utilized - counseling provided - current coping strategies identified - decision-making supported - healthy lifestyle promoted - journaling promoted - meditative movement therapy encouraged - mindfulness encouraged - participation in counseling encouraged - problem-solving facilitated - relaxation techniques promoted - self-reflection promoted - spiritual activities promoted - verbalization of feelings encouraged    Notes:      Follow Up Plan: SW will follow up with patient by phone over the next quarter  Eula Fried, BSW, MSW, [email or web address] Phone: [phone]

## 2020-02-23 NOTE — Telephone Encounter (Signed)
erroneous error

## 2020-02-24 ENCOUNTER — Encounter: Payer: Self-pay | Admitting: Nurse Practitioner

## 2020-02-24 ENCOUNTER — Ambulatory Visit (INDEPENDENT_AMBULATORY_CARE_PROVIDER_SITE_OTHER): Payer: Medicare Other | Admitting: Nurse Practitioner

## 2020-02-24 ENCOUNTER — Telehealth: Payer: Self-pay

## 2020-02-24 ENCOUNTER — Other Ambulatory Visit: Payer: Self-pay

## 2020-02-24 ENCOUNTER — Telehealth: Payer: Self-pay | Admitting: General Practice

## 2020-02-24 VITALS — BP 114/69 | HR 58 | Temp 97.6°F | Ht 61.77 in | Wt 181.2 lb

## 2020-02-24 DIAGNOSIS — E1165 Type 2 diabetes mellitus with hyperglycemia: Secondary | ICD-10-CM | POA: Diagnosis not present

## 2020-02-24 MED ORDER — TRESIBA FLEXTOUCH 100 UNIT/ML ~~LOC~~ SOPN
PEN_INJECTOR | SUBCUTANEOUS | 2 refills | Status: DC
Start: 2020-02-24 — End: 2020-04-23

## 2020-02-24 NOTE — Assessment & Plan Note (Signed)
Chronic, ongoing.  A1C today to 9.4%.  Has been making poor diet choices, but has cut out sodas.  Continue Jardiance and increase Tresiba to 40 units QHS, if fasting BS remain >130 in 3 days then increase by 2 units continues to increase by 2 units every 3 days if elevation -- wrote instructions for his wife to review.  Poor tolerance to Metformin in past and allergic to Sulfa.  Educated patient at length on effect of diabetes from head to toe and increased risk for recurrent CVA due to poor control.  Recommend they check his BS QID, fasting and 2 hours after meals + document.  Would avoid GLP at this time due to patient history of pancreatitis, concern this would flare.  Goal A1C <6.5%.  Continue collaboration with CCM team.  Return in 3 months, may need to consider addition of pre meal insulin if ongoing elevations in future or referral to endocrinology.

## 2020-02-24 NOTE — Progress Notes (Signed)
BP 114/69   Pulse (!) 58   Temp 97.6 F (36.4 C) (Oral)   Ht 5' 1.77" (1.569 m)   Wt 181 lb 3.2 oz (82.2 kg)   SpO2 95%   BMI 33.39 kg/m    Subjective:    Patient ID: [name]., male    DOB: [birth year], 59 y.o.   MRN: [id number]  HPI: [name]. is a 59 y.o. male  Chief Complaint  Patient presents with  . Diabetes    Had insulin increase   DIABETES Recent A1C in October 9.4% and continues [place], which was increased to 36 units at last visit. He continues on Jardiance 25 MG along with [place]. His wife, [name], is in charge of medications at home and she is not present in room today.  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 Has seen dietician. He reports he has stopped drinking sodas. Has been drinking flavored water and tea.    Hypoglycemic episodes:no Polydipsia/polyuria: no Visual disturbance: no Chest pain: no Paresthesias: no Glucose Monitoring: yes  Accucheck frequency: BID -- forgot his book today  Fasting glucose:  Post prandial:  Evening:  Before meals: Taking Insulin?: yes  Long acting insulin: Tresiba 36 units  Short acting insulin: Blood Pressure Monitoring: not checking Retinal Examination: Not up to Date Foot Exam: Up to Date Pneumovax: Up to Date Influenza: Up to Date Aspirin: yes  Relevant past medical, surgical, family and social history reviewed and updated as indicated. Interim medical history since our last visit reviewed. Allergies and medications reviewed and updated.  Review of Systems  Constitutional: Negative for activity change, diaphoresis, fatigue and fever.  Respiratory: Negative for cough, chest tightness, shortness of breath and wheezing.   Cardiovascular: Negative for chest pain, palpitations and leg swelling.  Gastrointestinal: Negative.   Endocrine: Negative for cold intolerance, heat intolerance,  polydipsia, polyphagia and polyuria.  Neurological: Negative.   Psychiatric/Behavioral: Negative.     Per HPI unless specifically indicated above     Objective:    BP 114/69   Pulse (!) 58   Temp 97.6 F (36.4 C) (Oral)   Ht 5' 1.77" (1.569 m)   Wt 181 lb 3.2 oz (82.2 kg)   SpO2 95%   BMI 33.39 kg/m   Wt Readings from Last 3 Encounters:  02/24/20 181 lb 3.2 oz (82.2 kg)  12/30/19 178 lb 6.4 oz (80.9 kg)  09/30/19 177 lb (80.3 kg)    Physical Exam Vitals and nursing note reviewed.  Constitutional:      General: He is awake. He is not in acute distress.    Appearance: He is well-developed. He is obese. He is not ill-appearing.  HENT:     Head: Normocephalic and atraumatic.     Right Ear: Hearing normal. No drainage.     Left Ear: Hearing normal. No drainage.  Eyes:     General: Lids are normal.        Right eye: No discharge.        Left eye: No discharge.     Conjunctiva/sclera: Conjunctivae normal.     Pupils: Pupils are equal, round, and reactive to light.  Neck:     Thyroid: No thyromegaly.     Vascular: No carotid bruit.  Cardiovascular:     Rate and Rhythm: Normal rate and regular rhythm.     Heart sounds: Normal heart sounds, S1 normal and  S2 normal. No murmur heard.  No gallop.   Pulmonary:     Effort: Pulmonary effort is normal. No accessory muscle usage or respiratory distress.     Breath sounds: Normal breath sounds.  Abdominal:     General: Bowel sounds are normal.     Palpations: Abdomen is soft.  Musculoskeletal:        General: Normal range of motion.     Cervical back: Normal range of motion and neck supple.     Right lower leg: No edema.     Left lower leg: No edema.  Skin:    General: Skin is warm and dry.     Capillary Refill: Capillary refill takes less than 2 seconds.  Neurological:     Mental Status: He is alert and oriented to person, place, and time.     Deep Tendon Reflexes: Reflexes are normal and symmetric.  Psychiatric:         Attention and Perception: Attention normal.        Mood and Affect: Mood normal.        Speech: Speech normal.        Behavior: Behavior normal. Behavior is cooperative.     Results for orders placed or performed in visit on 12/30/19  Bayer DCA Hb A1c Waived  Result Value Ref Range   HB A1C (BAYER DCA - WAIVED) 9.4 (H) <7.0 %  Lipid Panel w/o Chol/HDL Ratio  Result Value Ref Range   Cholesterol, Total 150 100 - 199 mg/dL   Triglycerides 144 0 - 149 mg/dL   HDL 40 >39 mg/dL   VLDL Cholesterol Cal 25 5 - 40 mg/dL   LDL Chol Calc (NIH) 85 0 - 99 mg/dL  VITAMIN D 25 Hydroxy (Vit-D Deficiency, Fractures)  Result Value Ref Range   Vit D, 25-Hydroxy 37.2 30.0 - 100.0 ng/mL  Comprehensive metabolic panel  Result Value Ref Range   Glucose 230 (H) 65 - 99 mg/dL   BUN 18 6 - 24 mg/dL   Creatinine, Ser 1.21 0.76 - 1.27 mg/dL   GFR calc non Af Amer 67 >59 mL/min/1.73   GFR calc Af Amer 77 >59 mL/min/1.73   BUN/Creatinine Ratio 15 9 - 20   Sodium 137 134 - 144 mmol/L   Potassium 4.4 3.5 - 5.2 mmol/L   Chloride 101 96 - 106 mmol/L   CO2 22 20 - 29 mmol/L   Calcium 9.3 8.7 - 10.2 mg/dL   Total Protein 7.1 6.0 - 8.5 g/dL   Albumin 4.6 3.8 - 4.9 g/dL   Globulin, Total 2.5 1.5 - 4.5 g/dL   Albumin/Globulin Ratio 1.8 1.2 - 2.2   Bilirubin Total 0.7 0.0 - 1.2 mg/dL   Alkaline Phosphatase 130 (H) 44 - 121 IU/L   AST 18 0 - 40 IU/L   ALT 25 0 - 44 IU/L  UA/M w/rflx Culture, Routine   Specimen: Urine   Urine  Result Value Ref Range   Specific Gravity, UA <1.005 (L) 1.005 - 1.030   pH, UA 5.0 5.0 - 7.5   Color, UA Yellow Yellow   Appearance Ur Clear Clear   Leukocytes,UA Negative Negative   Protein,UA Negative Negative/Trace   Glucose, UA Negative Negative   Ketones, UA Negative Negative   RBC, UA Negative Negative   Bilirubin, UA Negative Negative   Urobilinogen, Ur 0.2 0.2 - 1.0 mg/dL   Nitrite, UA Negative Negative      Assessment & Plan:   Problem List Items Addressed  This  Visit      Endocrine   Uncontrolled type 2 diabetes mellitus with hyperglycemia (HCC) - Primary    Chronic, ongoing.  A1C today to 9.4%.  Has been making poor diet choices, but has cut out sodas.  Continue Jardiance and increase Tresiba to 40 units QHS, if fasting BS remain >130 in 3 days then increase by 2 units continues to increase by 2 units every 3 days if elevation -- wrote instructions for his wife to review.  Poor tolerance to Metformin in past and allergic to Sulfa.  Educated patient at length on effect of diabetes from head to toe and increased risk for recurrent CVA due to poor control.  Recommend they check his BS QID, fasting and 2 hours after meals + document.  Would avoid GLP at this time due to patient history of pancreatitis, concern this would flare.  Goal A1C <6.5%.  Continue collaboration with CCM team.  Return in 3 months, may need to consider addition of pre meal insulin if ongoing elevations in future or referral to endocrinology.      Relevant Medications   insulin degludec (TRESIBA FLEXTOUCH) 100 UNIT/ML FlexTouch Pen   Other Relevant Orders   Basic metabolic panel   Bayer DCA Hb A1c Waived       Follow up plan: Return in about 3 months (around 05/24/2020) for T2DM, HTN/HLD.

## 2020-02-24 NOTE — Telephone Encounter (Signed)
  Chronic Care Management   Outreach Note  02/24/2020 Name: [name]. MRN: [id number] DOB: 05/05/1960  Referred by: [name], NP Reason for referral : Chronic Care Management (RNCM Follow up call for Chronic Disease Management and Care Coordination Needs )   [name]. is enrolled in a Managed Medicaid Health Plan: No  An unsuccessful telephone outreach was attempted today. The patient was referred to the case management team for assistance with care management and care coordination.  The patient was getting ready to leave to go to an appointment. Will reschedule.  Follow Up Plan: The care management team will reach out to the patient again over the next 30 to 60 days.   Noreene Larsson RN, MSN, [facility] Mobile: [phone]

## 2020-02-24 NOTE — Patient Instructions (Addendum)
INCREASE TRESIBA TO 40 UNITS  Diabetes Mellitus and Nutrition, Adult When you have diabetes (diabetes mellitus), it is very important to have healthy eating habits because your blood sugar (glucose) levels are greatly affected by what you eat and drink. Eating healthy foods in the appropriate amounts, at about the same times every day, can help you:  Control your blood glucose.  Lower your risk of heart disease.  Improve your blood pressure.  Reach or maintain a healthy weight. Every person with diabetes is different, and each person has different needs for a meal plan. Your health care provider may recommend that you work with a diet and nutrition specialist (dietitian) to make a meal plan that is best for you. Your meal plan may vary depending on factors such as:  The calories you need.  The medicines you take.  Your weight.  Your blood glucose, blood pressure, and cholesterol levels.  Your activity level.  Other health conditions you have, such as heart or kidney disease. How do carbohydrates affect me? Carbohydrates, also called carbs, affect your blood glucose level more than any other type of food. Eating carbs naturally raises the amount of glucose in your blood. Carb counting is a method for keeping track of how many carbs you eat. Counting carbs is important to keep your blood glucose at a healthy level, especially if you use insulin or take certain oral diabetes medicines. It is important to know how many carbs you can safely have in each meal. This is different for every person. Your dietitian can help you calculate how many carbs you should have at each meal and for each snack. Foods that contain carbs include:  Bread, cereal, rice, pasta, and crackers.  Potatoes and corn.  Peas, beans, and lentils.  Milk and yogurt.  Fruit and juice.  Desserts, such as cakes, cookies, ice cream, and candy. How does alcohol affect me? Alcohol can cause a sudden decrease in blood  glucose (hypoglycemia), especially if you use insulin or take certain oral diabetes medicines. Hypoglycemia can be a life-threatening condition. Symptoms of hypoglycemia (sleepiness, dizziness, and confusion) are similar to symptoms of having too much alcohol. If your health care provider says that alcohol is safe for you, follow these guidelines:  Limit alcohol intake to no more than 1 drink per day for nonpregnant women and 2 drinks per day for men. One drink equals 12 oz of beer, 5 oz of wine, or 1 oz of hard liquor.  Do not drink on an empty stomach.  Keep yourself hydrated with water, diet soda, or unsweetened iced tea.  Keep in mind that regular soda, juice, and other mixers may contain a lot of sugar and must be counted as carbs. What are tips for following this plan?  Reading food labels  Start by checking the serving size on the "Nutrition Facts" label of packaged foods and drinks. The amount of calories, carbs, fats, and other nutrients listed on the label is based on one serving of the item. Many items contain more than one serving per package.  Check the total grams (g) of carbs in one serving. You can calculate the number of servings of carbs in one serving by dividing the total carbs by 15. For example, if a food has 30 g of total carbs, it would be equal to 2 servings of carbs.  Check the number of grams (g) of saturated and trans fats in one serving. Choose foods that have low or no amount of these  fats.  Check the number of milligrams (mg) of salt (sodium) in one serving. Most people should limit total sodium intake to less than 2,300 mg per day.  Always check the nutrition information of foods labeled as "low-fat" or "nonfat". These foods may be higher in added sugar or refined carbs and should be avoided.  Talk to your dietitian to identify your daily goals for nutrients listed on the label. Shopping  Avoid buying canned, premade, or processed foods. These foods tend to  be high in fat, sodium, and added sugar.  Shop around the outside edge of the grocery store. This includes fresh fruits and vegetables, bulk grains, fresh meats, and fresh dairy. Cooking  Use low-heat cooking methods, such as baking, instead of high-heat cooking methods like deep frying.  Cook using healthy oils, such as olive, canola, or sunflower oil.  Avoid cooking with butter, cream, or high-fat meats. Meal planning  Eat meals and snacks regularly, preferably at the same times every day. Avoid going long periods of time without eating.  Eat foods high in fiber, such as fresh fruits, vegetables, beans, and whole grains. Talk to your dietitian about how many servings of carbs you can eat at each meal.  Eat 4-6 ounces (oz) of lean protein each day, such as lean meat, chicken, fish, eggs, or tofu. One oz of lean protein is equal to: ? 1 oz of meat, chicken, or fish. ? 1 egg. ?  cup of tofu.  Eat some foods each day that contain healthy fats, such as avocado, nuts, seeds, and fish. Lifestyle  Check your blood glucose regularly.  Exercise regularly as told by your health care provider. This may include: ? 150 minutes of moderate-intensity or vigorous-intensity exercise each week. This could be brisk walking, biking, or water aerobics. ? Stretching and doing strength exercises, such as yoga or weightlifting, at least 2 times a week.  Take medicines as told by your health care provider.  Do not use any products that contain nicotine or tobacco, such as cigarettes and e-cigarettes. If you need help quitting, ask your health care provider.  Work with a Social worker or diabetes educator to identify strategies to manage stress and any emotional and social challenges. Questions to ask a health care provider  Do I need to meet with a diabetes educator?  Do I need to meet with a dietitian?  What number can I call if I have questions?  When are the best times to check my blood  glucose? Where to find more information:  American Diabetes Association: diabetes.org  Academy of Nutrition and Dietetics: www.eatright.[employer] of Diabetes and Digestive and Kidney Diseases (NIH): [employer] Summary  A healthy meal plan will help you control your blood glucose and maintain a healthy lifestyle.  Working with a diet and nutrition specialist (dietitian) can help you make a meal plan that is best for you.  Keep in mind that carbohydrates (carbs) and alcohol have immediate effects on your blood glucose levels. It is important to count carbs and to use alcohol carefully. This information is not intended to replace advice given to you by your health care provider. Make sure you discuss any questions you have with your health care provider. Document Revised: 02/15/2017 Document Reviewed: 04/09/2016 Elsevier Patient Education  2020 [employer].

## 2020-02-25 ENCOUNTER — Other Ambulatory Visit: Payer: Medicare Other

## 2020-02-25 LAB — BASIC METABOLIC PANEL
BUN/Creatinine Ratio: 17 (ref 9–20)
BUN: 20 mg/dL (ref 6–24)
CO2: 23 mmol/L (ref 20–29)
Calcium: 9.1 mg/dL (ref 8.7–10.2)
Chloride: 99 mmol/L (ref 96–106)
Creatinine, Ser: 1.21 mg/dL (ref 0.76–1.27)
GFR calc Af Amer: 76 mL/min/{1.73_m2} (ref >59)
GFR calc non Af Amer: 66 mL/min/{1.73_m2} (ref >59)
Glucose: 377 mg/dL — ABNORMAL HIGH (ref 65–99)
Potassium: 4.8 mmol/L (ref 3.5–5.2)
Sodium: 136 mmol/L (ref 134–144)

## 2020-02-25 LAB — BAYER DCA HB A1C WAIVED: HB A1C (BAYER DCA - WAIVED): 9.4 % — ABNORMAL HIGH (ref <7.0)

## 2020-02-25 NOTE — Progress Notes (Signed)
Good morning, please let [name] know his kidney function remains stable, but sugar was elevated on labs still.  Please increase insulin as we discussed and check sugars 2 hours after each meal + document, as if ongoing elevation we may need to add insulin before meals next visit.  If any questions let me know.  Have a great day!! Keep being awesome!!  Thank you for allowing me to participate in your care. Kindest regards, [name]

## 2020-03-02 ENCOUNTER — Ambulatory Visit: Payer: Medicare Other | Admitting: Dermatology

## 2020-03-20 ENCOUNTER — Other Ambulatory Visit: Payer: Self-pay | Admitting: Nurse Practitioner

## 2020-03-23 ENCOUNTER — Ambulatory Visit: Payer: Self-pay | Admitting: Pharmacist

## 2020-03-23 DIAGNOSIS — Z8673 Personal history of transient ischemic attack (TIA), and cerebral infarction without residual deficits: Secondary | ICD-10-CM

## 2020-03-23 DIAGNOSIS — I152 Hypertension secondary to endocrine disorders: Secondary | ICD-10-CM

## 2020-03-23 DIAGNOSIS — E1165 Type 2 diabetes mellitus with hyperglycemia: Secondary | ICD-10-CM

## 2020-03-23 DIAGNOSIS — E1159 Type 2 diabetes mellitus with other circulatory complications: Secondary | ICD-10-CM

## 2020-03-23 NOTE — Chronic Care Management (AMB) (Signed)
Chronic Care Management Pharmacy  Name: [name].  MRN: [id number] DOB: [birth year]   Chief Complaint/ HPI  Alex Metro.,  60 y.o. , male presents for their Follow-Up CCM visit with the clinical pharmacist via telephone. Spoke with wife [name].  PCP : [name], NP Patient Care Team: [name], NP as PCP - General (Nurse Practitioner) Anell Barr, [place] (Optometry) Dionisio David, MD as Consulting Physician (Cardiology) Greg Cutter, LCSW as Social Worker (Licensed Clinical Social Worker) Hall Busing, Nobie Putnam, RN as Case Manager ([place]) Vladimir Faster, [facility] as Pharmacist (Pharmacist)  Their chronic conditions include: Hypertension, Hyperlipidemia, Diabetes, Coronary Artery Disease, GERD, COPD and Allergic Rhinitis   Office Visits: 02/24/20- Jolene Cannady. NP - blood work 12/30/19- Marnee Guarneri, NP- blood work, increased [place], abd [name] for abd aorta assessmetn follow up 09/30/19- Marnee Guarneri, NP- bloodwork, Diclofenac topical, tizanidine for muscle spasms  Consult Visit:knee 01/25/20- Dr. Juleen China, Nephrology- lab work 11/09/19-Dr. Melrose Nakayama, Neurology - restart Plavix, referral to dermatology for cyst eval on    Allergies  Allergen Reactions  . Bee Venom Hives    All kinds of bees  . Other     Certain powders  . Sulfa Antibiotics     Medications: Outpatient Encounter Medications as of 03/23/2020  Medication Sig  . acetaminophen (TYLENOL) 325 MG tablet Take 1-2 tablets (325-650 mg total) by mouth every 4 (four) hours as needed for mild pain. (Patient taking differently: Take 325-650 mg by mouth every 4 (four) hours as needed for mild pain. )  . albuterol (PROAIR HFA) 108 (90 Base) MCG/ACT inhaler INHALE 2 PUFFS BY MOUTH EVERY 6 HOURS IF NEEDED FOR COUGH OR WHEEZING.  [name] aspirin 81 MG EC tablet Take 1 tablet (81 mg total) by mouth daily.  [name] atorvastatin (LIPITOR) 80 MG tablet Take 1 tablet (80 mg total) by mouth daily.  .  Blood Glucose Monitoring Suppl (ONE TOUCH ULTRA 2) w/Device KIT Use to check blood sugar 4 times a day  . BREO ELLIPTA 100-25 MCG/INH AEPB INHALE 1 PUFF BY MOUTH ONCE DAILY  . clobetasol cream (TEMOVATE) 5.46 % Apply 1 application topically 2 (two) times daily. Apply to affected area  . clopidogrel (PLAVIX) 75 MG tablet Take 1 tablet by mouth daily.  [name] Dextran 70-Hypromellose (ARTIFICIAL TEARS) 0.1-0.3 % SOLN Apply 1 drop to eye 3 (three) times daily. To right eye only.  . diclofenac Sodium (VOLTAREN) 1 % GEL Apply 2 g topically 3 (three) times daily.  . empagliflozin (JARDIANCE) 25 MG TABS tablet Take 1 tablet (25 mg total) by mouth daily.  . fenofibrate 54 MG tablet Take 1 tablet (54 mg total) by mouth daily.  [name] gabapentin (NEURONTIN) 300 MG capsule TAKE 1 CAPSULE BY MOUTH TWICE DAILY  . glucose blood (ONETOUCH ULTRA) test strip To check blood sugar three times daily  . insulin degludec (TRESIBA FLEXTOUCH) 100 UNIT/ML FlexTouch Pen INJECT 40 UNITS SUBQ ONCE DAILY  . Insulin Pen Needle (PEN NEEDLES) 32G X 4 MM MISC 1 kit by Does not apply route daily.  . Lancets (ONETOUCH ULTRASOFT) lancets Use as instructed  . lisinopril (ZESTRIL) 20 MG tablet Take 1 tablet (20 mg total) by mouth daily.  . montelukast (SINGULAIR) 10 MG tablet TAKE 1 TABLET BY MOUTH ONCE EVERY EVENING AT BEDTIME.  . nitroGLYCERIN (NITROSTAT) 0.4 MG SL tablet Place 1 tablet (0.4 mg total) under the tongue every 5 (five) minutes as needed for chest pain. (Patient taking  differently: Place 0.4 mg under the tongue every 5 (five) minutes as needed for chest pain. )  . omeprazole (PRILOSEC) 20 MG capsule Take 1 capsule (20 mg total) by mouth daily.  . polyethylene glycol (MIRALAX) 17 g packet Take 17 g by mouth daily.  [name] tiZANidine (ZANAFLEX) 2 MG tablet TAKE 1 TABLET BY MOUTH EVERY 6 HOURS AS NEEDED MUSCLE SPASMS  . trimethoprim-polymyxin b (POLYTRIM) ophthalmic solution Place 1 drop into the right eye every 6 (six) hours.   .  Vitamin D, Ergocalciferol, (DRISDOL) 1.25 MG (50000 UNIT) CAPS capsule TAKE 1 CAPSULE BY MOUTH ONCE A WEEK   No facility-administered encounter medications on file as of 03/23/2020.    Wt Readings from Last 3 Encounters:  02/24/20 181 lb 3.2 oz (82.2 kg)  12/30/19 178 lb 6.4 oz (80.9 kg)  09/30/19 177 lb (80.3 kg)    Current Diagnosis/Assessment:    Goals Addressed            This Visit's Progress   . Pharmacy Care Plan       CARE PLAN ENTRY (see longitudinal plan of care for additional care plan information)  Current Barriers:  . Chronic Disease Management support, education, and care coordination needs related to Hypertension, Hyperlipidemia, Diabetes, Coronary Artery Disease, GERD, COPD, and Allergic Rhinitis   Hypertension BP Readings from Last 3 Encounters:  02/24/20 114/69  12/30/19 112/71  09/30/19 118/79   . Pharmacist Clinical Goal(s): o Over the next 60 days, patient will work with PharmD and providers to maintain BP goal <130/80 . Current regimen:  o Lisinopril 20 mg qd . Interventions:  Reviewed proper BP technique including sitting down for at least 5 minutes before, resting calmly, with your feet flat on the floor.   o Provided diet and exercise counseling. . Patient self care activities - Over the next60 days, patient will: o Check BP daily, document, and provide at future appointments o Ensure daily salt intake < 2300 mg/day   Diabetes Lab Results  Component Value Date/Time   HGBA1C 9.4 (H) 02/24/2020 04:21 PM   HGBA1C 9.4 (H) 12/30/2019 02:35 PM   . Pharmacist Clinical Goal(s): o Over the next 60 days, patient will work with PharmD and providers to achieve A1c goal <6.5% . Current regimen:  o Tresiba 40 units daily o Jardiance 25 mg daily . Interventions: o Provided diet and exercise counseling. o Reviewed goal glucose readings for an A1c of <7%, we want to see fasting sugars <130 and 2 hour after meal sugars <180.  o Reviewed last PCP note  with instruction to increase Tresiba dose. . Patient self care activities - Over the next 60 days, patient will: o Check blood sugar twice daily, document, and provide at future appointments o Contact provider with any episodes of hypoglycemia o Focus on DASH diet and increasing exercise o Atttend all scheduled appointments   Medication management . Pharmacist Clinical Goal(s): o Over the next 60 days, patient will work with PharmD and providers to achieve optimal medication adherence . Current pharmacy: [facility]  . Interventions o Comprehensive medication review performed. o Continue current medication management strategy . Patient self care activities - Over the next 60 days, patient will: o Focus on medication adherence by fill dates. o Take medications as prescribed o Report any questions or concerns to PharmD and/or provider(s)  Please see past updates related to this goal by clicking on the "Past Updates" button in the selected goal  Diabetes   A1c goal <6.5%  Recent Relevant Labs: Lab Results  Component Value Date/Time   HGBA1C 9.4 (H) 02/24/2020 04:21 PM   HGBA1C 9.4 (H) 12/30/2019 02:35 PM   MICROALBUR 10 07/17/2019 10:24 AM   MICROALBUR 80 (H) 03/25/2019 09:16 AM   MICROALBUR 20 08/22/2015 05:04 PM    Last diabetic Eye exam:  Lab Results  Component Value Date/Time   HMDIABEYEEXA No Retinopathy 07/01/2017 12:00 AM    Last diabetic Foot exam: No results found for: HMDIABFOOTEX   Checking BG: Daily  Recent FBG Readings: 323 while on the call with me Recent 2hr PP BG readings:  294, 265   Patient has failed these meds in past: glipizide, Invokana,metfomin, history of pancreatitis so avoiding GLP-1.  Patient is currently uncontrolled on the following medications: . Tresiba 40  units daily . Jardiance 25 mg daily  We discussed: diet and exercise extensively and how to recognize and treat signs of hypoglycemia. Wife states they continue drinking  water and given up soda. Reviewed goal glucose readings for an A1c of <7%, we want to see fasting sugars <130 and 2 hour after meal sugars <180. [name] states they have been getting healthy meals. [name] states he is a picky eater and she has a hard time convincing him to eat healthy. He has not had any further readings in the 600s.[name] reports their refrigerator went out and they lost their insulin but have gotten refills and have a refrigerator. Unclear how many doses were missed. He has only been checking 2 hours after supper. Counseled [name] to have him check fasting in the morning as well.  Encouraged [name] to review his BG log and keep his readings along with hers. Will needs surgery on his knee and I counseled that he will need to get his A1c down prior to procedure.   Plan  Continue current medications   Hypertension   BP goal is:  <130/80  Office blood pressures are  BP Readings from Last 3 Encounters:  02/24/20 114/69  12/30/19 112/71  09/30/19 118/79   BMP Latest Ref Rng & Units 02/24/2020 12/30/2019 09/30/2019  Glucose 65 - 99 mg/dL 377(H) 230(H) 236(H)  BUN 6 - 24 mg/dL _0 Creatinine 0.76 - 1.27 mg/dL 1.21 1.21 1.09  BUN/Creat Ratio 9 - _1 Sodium 134 - 144 mmol/L 136 137 143  Potassium 3.5 - 5.2 mmol/L 4.8 4.4 4.2  Chloride 96 - 106 mmol/L 99 101 109(H)  CO2 20 - 29 mmol/L _2 Calcium 8.7 - 10.2 mg/dL 9.1 9.3 9.3    Patient checks BP at home 3-5x per week Patient home BP readings are ranging: 130-140s/80-90  Patient has failed these meds in the past: Unavailable from chart Patient is currently controlled on the following medications:  . Lisinopril 20 mg daily  We discussed diet and exercise extensively. Wife [name] reports she threw out all salt since last appointment and informed Will not to sneak and buy it. BP this morning was 136/95 with HR of 53 per Will's BP log. Reviewed proper technique. [name] and Will practice her leg PT exercises together  and they now have an exercise bike in home which Will uses twice daily for varying lengths of time. Encourage him to work toward 30 minutes/day  Five days per week. Fill history is consistent with daily administration.  Plan  Continue current medications     Hyperlipidemia/CAD/History CVA/ NAFLD   LDL goal < 70  Last lipids Lab Results  Component Value Date   CHOL 150 12/30/2019   HDL 40 12/30/2019   LDLCALC 85 12/30/2019   TRIG 144 12/30/2019   CHOLHDL 5.3 03/03/2019   Hepatic Function Latest Ref Rng & Units 12/30/2019 03/25/2019 03/07/2019  Total Protein 6.0 - 8.5 g/dL 7.1 7.4 6.1(L)  Albumin 3.8 - 4.9 g/dL 4.6 4.9 3.4(L)  AST 0 - 40 IU/L _0 ALT 0 - 44 IU/L 25 31 36  Alk Phosphatase 44 - 121 IU/L 130(H) 173(H) 89  Total Bilirubin 0.0 - 1.2 mg/dL 0.7 0.4 0.6  Bilirubin, Direct 0.1 - 0.5 mg/dL - - -     The ASCVD Risk score Mikey Bussing DC Jr., et al., 2013) failed to calculate for the following reasons:   The patient has a prior MI or stroke diagnosis   Patient has failed these meds in past: NA Patient is currently query controlled on the following medications:  . Atorvastatin 80 mg qd . Clopidogrel 75 mg qd  . Aspirin 81 mg qd . Fenofibrate 54 mg qd   We discussed:  Diet and exercise. [place] and per [name] he has a large, dark bruise on his leg.  He denies hitting his head or signs of bleeding. Counseled [name] to check daily and seek medical attention of bruise becomes bigger or if Will experiences any altered mentation. Encouraged follow up with cardiology.  Plan  Continue current medications. Consider addition of PCSK 9 inhibitor to get LDL to goal.     Medication Management   Pt uses [facility] for all medications Uses pill box? Yes  Plan  Continue current medication management strategy    Follow up:1- 2 month phone visit. Pharmacy team to reach out in 2-4 weeks.  Junita Push. Kenton Kingfisher PharmD, [facility] [phone]

## 2020-03-24 ENCOUNTER — Telehealth: Payer: Self-pay | Admitting: Nurse Practitioner

## 2020-03-24 NOTE — Telephone Encounter (Signed)
Patient has lost blood sugar testing supplies as well as his glucometer. Would like to be called to discuss options for replacement

## 2020-03-24 NOTE — Patient Instructions (Addendum)
Visit Information  It was a pleasure speaking with you today. Thank you for letting me be part of your clinical team. Please call with any questions or concerns.   Goals Addressed            This Visit's Progress   . Pharmacy Care Plan       CARE PLAN ENTRY (see longitudinal plan of care for additional care plan information)  Current Barriers:  . Chronic Disease Management support, education, and care coordination needs related to Hypertension, Hyperlipidemia, Diabetes, Coronary Artery Disease, GERD, COPD, and Allergic Rhinitis   Hypertension BP Readings from Last 3 Encounters:  02/24/20 114/69  12/30/19 112/71  09/30/19 118/79   . Pharmacist Clinical Goal(s): o Over the next 60 days, patient will work with PharmD and providers to maintain BP goal <130/80 . Current regimen:  o Lisinopril 20 mg qd . Interventions:  Reviewed proper BP technique including sitting down for at least 5 minutes before, resting calmly, with your feet flat on the floor.   o Provided diet and exercise counseling. . Patient self care activities - Over the next60 days, patient will: o Check BP daily, document, and provide at future appointments o Ensure daily salt intake < 2300 mg/day   Diabetes Lab Results  Component Value Date/Time   HGBA1C 9.4 (H) 02/24/2020 04:21 PM   HGBA1C 9.4 (H) 12/30/2019 02:35 PM   . Pharmacist Clinical Goal(s): o Over the next 60 days, patient will work with PharmD and providers to achieve A1c goal <6.5% . Current regimen:  o Tresiba 40 units daily o Jardiance 25 mg daily . Interventions: o Provided diet and exercise counseling. o Reviewed goal glucose readings for an A1c of <7%, we want to see fasting sugars <130 and 2 hour after meal sugars <180.  o Reviewed last PCP note with instruction to increase Tresiba dose. . Patient self care activities - Over the next 60 days, patient will: o Check blood sugar twice daily, document, and provide at future  appointments o Contact provider with any episodes of hypoglycemia o Focus on DASH diet and increasing exercise o Atttend all scheduled appointments   Medication management . Pharmacist Clinical Goal(s): o Over the next 60 days, patient will work with PharmD and providers to achieve optimal medication adherence . Current pharmacy: [facility]  . Interventions o Comprehensive medication review performed. o Continue current medication management strategy . Patient self care activities - Over the next 60 days, patient will: o Focus on medication adherence by fill dates. o Take medications as prescribed o Report any questions or concerns to PharmD and/or provider(s)  Please see past updates related to this goal by clicking on the "Past Updates" button in the selected goal         The patient verbalized understanding of instructions, educational materials, and care plan provided today and agreed to receive a mailed copy of patient instructions, educational materials, and care plan.   Telephone follow up appointment with pharmacy team member scheduled for: 1 month  Junita Push. Omega Slager PharmD, BCPS Clinical Pharmacist [phone]  Diabetes Mellitus and Nutrition, Adult When you have diabetes (diabetes mellitus), it is very important to have healthy eating habits because your blood sugar (glucose) levels are greatly affected by what you eat and drink. Eating healthy foods in the appropriate amounts, at about the same times every day, can help you:  Control your blood glucose.  Lower your risk of heart disease.  Improve your blood pressure.  Reach or  maintain a healthy weight. Every person with diabetes is different, and each person has different needs for a meal plan. Your health care provider may recommend that you work with a diet and nutrition specialist (dietitian) to make a meal plan that is best for you. Your meal plan may vary depending on factors such as:  The calories you  need.  The medicines you take.  Your weight.  Your blood glucose, blood pressure, and cholesterol levels.  Your activity level.  Other health conditions you have, such as heart or kidney disease. How do carbohydrates affect me? Carbohydrates, also called carbs, affect your blood glucose level more than any other type of food. Eating carbs naturally raises the amount of glucose in your blood. Carb counting is a method for keeping track of how many carbs you eat. Counting carbs is important to keep your blood glucose at a healthy level, especially if you use insulin or take certain oral diabetes medicines. It is important to know how many carbs you can safely have in each meal. This is different for every person. Your dietitian can help you calculate how many carbs you should have at each meal and for each snack. Foods that contain carbs include:  Bread, cereal, rice, pasta, and crackers.  Potatoes and corn.  Peas, beans, and lentils.  Milk and yogurt.  Fruit and juice.  Desserts, such as cakes, cookies, ice cream, and candy. How does alcohol affect me? Alcohol can cause a sudden decrease in blood glucose (hypoglycemia), especially if you use insulin or take certain oral diabetes medicines. Hypoglycemia can be a life-threatening condition. Symptoms of hypoglycemia (sleepiness, dizziness, and confusion) are similar to symptoms of having too much alcohol. If your health care provider says that alcohol is safe for you, follow these guidelines:  Limit alcohol intake to no more than 1 drink per day for nonpregnant women and 2 drinks per day for men. One drink equals 12 oz of beer, 5 oz of wine, or 1 oz of hard liquor.  Do not drink on an empty stomach.  Keep yourself hydrated with water, diet soda, or unsweetened iced tea.  Keep in mind that regular soda, juice, and other mixers may contain a lot of sugar and must be counted as carbs. What are tips for following this plan?  Reading  food labels  Start by checking the serving size on the "Nutrition Facts" label of packaged foods and drinks. The amount of calories, carbs, fats, and other nutrients listed on the label is based on one serving of the item. Many items contain more than one serving per package.  Check the total grams (g) of carbs in one serving. You can calculate the number of servings of carbs in one serving by dividing the total carbs by 15. For example, if a food has 30 g of total carbs, it would be equal to 2 servings of carbs.  Check the number of grams (g) of saturated and trans fats in one serving. Choose foods that have low or no amount of these fats.  Check the number of milligrams (mg) of salt (sodium) in one serving. Most people should limit total sodium intake to less than 2,300 mg per day.  Always check the nutrition information of foods labeled as "low-fat" or "nonfat". These foods may be higher in added sugar or refined carbs and should be avoided.  Talk to your dietitian to identify your daily goals for nutrients listed on the label. Shopping  Avoid buying canned,  premade, or processed foods. These foods tend to be high in fat, sodium, and added sugar.  Shop around the outside edge of the grocery store. This includes fresh fruits and vegetables, bulk grains, fresh meats, and fresh dairy. Cooking  Use low-heat cooking methods, such as baking, instead of high-heat cooking methods like deep frying.  Cook using healthy oils, such as olive, canola, or sunflower oil.  Avoid cooking with butter, cream, or high-fat meats. Meal planning  Eat meals and snacks regularly, preferably at the same times every day. Avoid going long periods of time without eating.  Eat foods high in fiber, such as fresh fruits, vegetables, beans, and whole grains. Talk to your dietitian about how many servings of carbs you can eat at each meal.  Eat 4-6 ounces (oz) of lean protein each day, such as lean meat, chicken,  fish, eggs, or tofu. One oz of lean protein is equal to: ? 1 oz of meat, chicken, or fish. ? 1 egg. ?  cup of tofu.  Eat some foods each day that contain healthy fats, such as avocado, nuts, seeds, and fish. Lifestyle  Check your blood glucose regularly.  Exercise regularly as told by your health care provider. This may include: ? 150 minutes of moderate-intensity or vigorous-intensity exercise each week. This could be brisk walking, biking, or water aerobics. ? Stretching and doing strength exercises, such as yoga or weightlifting, at least 2 times a week.  Take medicines as told by your health care provider.  Do not use any products that contain nicotine or tobacco, such as cigarettes and e-cigarettes. If you need help quitting, ask your health care provider.  Work with a Social worker or diabetes educator to identify strategies to manage stress and any emotional and social challenges. Questions to ask a health care provider  Do I need to meet with a diabetes educator?  Do I need to meet with a dietitian?  What number can I call if I have questions?  When are the best times to check my blood glucose? Where to find more information:  American Diabetes Association: diabetes.org  Academy of Nutrition and Dietetics: www.eatright.[employer] of Diabetes and Digestive and Kidney Diseases (NIH): [employer] Summary  A healthy meal plan will help you control your blood glucose and maintain a healthy lifestyle.  Working with a diet and nutrition specialist (dietitian) can help you make a meal plan that is best for you.  Keep in mind that carbohydrates (carbs) and alcohol have immediate effects on your blood glucose levels. It is important to count carbs and to use alcohol carefully. This information is not intended to replace advice given to you by your health care provider. Make sure you discuss any questions you have with your health care provider. Document  Revised: 02/15/2017 Document Reviewed: 04/09/2016 Elsevier Patient Education  2020 [employer].  Hypertension, Adult Hypertension is another name for high blood pressure. High blood pressure forces your heart to work harder to pump blood. This can cause problems over time. There are two numbers in a blood pressure reading. There is a top number (systolic) over a bottom number (diastolic). It is best to have a blood pressure that is below 120/80. Healthy choices can help lower your blood pressure, or you may need medicine to help lower it. What are the causes? The cause of this condition is not known. Some conditions may be related to high blood pressure. What increases the risk?  Smoking.  Having type 2 diabetes  mellitus, high cholesterol, or both.  Not getting enough exercise or physical activity.  Being overweight.  Having too much fat, sugar, calories, or salt (sodium) in your diet.  Drinking too much alcohol.  Having long-term (chronic) kidney disease.  Having a family history of high blood pressure.  Age. Risk increases with age.  Race. You may be at higher risk if you are African American.  Gender. Men are at higher risk than women before age 48. After age 20, women are at higher risk than men.  Having obstructive sleep apnea.  Stress. What are the signs or symptoms?  High blood pressure may not cause symptoms. Very high blood pressure (hypertensive crisis) may cause: ? Headache. ? Feelings of worry or nervousness (anxiety). ? Shortness of breath. ? Nosebleed. ? A feeling of being sick to your stomach (nausea). ? Throwing up (vomiting). ? Changes in how you see. ? Very bad chest pain. ? Seizures. How is this treated?  This condition is treated by making healthy lifestyle changes, such as: ? Eating healthy foods. ? Exercising more. ? Drinking less alcohol.  Your health care provider may prescribe medicine if lifestyle changes are not enough to get your  blood pressure under control, and if: ? Your top number is above 130. ? Your bottom number is above 80.  Your personal target blood pressure may vary. Follow these instructions at home: Eating and drinking   If told, follow the DASH eating plan. To follow this plan: ? Fill one half of your plate at each meal with fruits and vegetables. ? Fill one fourth of your plate at each meal with whole grains. Whole grains include whole-wheat pasta, brown rice, and whole-grain bread. ? Eat or drink low-fat dairy products, such as skim milk or low-fat yogurt. ? Fill one fourth of your plate at each meal with low-fat (lean) proteins. Low-fat proteins include fish, chicken without skin, eggs, beans, and tofu. ? Avoid fatty meat, cured and processed meat, or chicken with skin. ? Avoid pre-made or processed food.  Eat less than 1,500 mg of salt each day.  Do not drink alcohol if: ? Your doctor tells you not to drink. ? You are pregnant, may be pregnant, or are planning to become pregnant.  If you drink alcohol: ? Limit how much you use to:  0-1 drink a day for women.  0-2 drinks a day for men. ? Be aware of how much alcohol is in your drink. In the [place] one drink equals one 12 oz bottle of beer (355 mL), one 5 oz glass of wine (148 mL), or one 1 oz glass of hard liquor (44 mL). Lifestyle   Work with your doctor to stay at a healthy weight or to lose weight. Ask your doctor what the best weight is for you.  Get at least 30 minutes of exercise most days of the week. This may include walking, swimming, or biking.  Get at least 30 minutes of exercise that strengthens your muscles (resistance exercise) at least 3 days a week. This may include lifting weights or doing Pilates.  Do not use any products that contain nicotine or tobacco, such as cigarettes, e-cigarettes, and chewing tobacco. If you need help quitting, ask your doctor.  Check your blood pressure at home as told by your  doctor.  Keep all follow-up visits as told by your doctor. This is important. Medicines  Take over-the-counter and prescription medicines only as told by your doctor. Follow directions carefully.  Do  not skip doses of blood pressure medicine. The medicine does not work as well if you skip doses. Skipping doses also puts you at risk for problems.  Ask your doctor about side effects or reactions to medicines that you should watch for. Contact a doctor if you:  Think you are having a reaction to the medicine you are taking.  Have headaches that keep coming back (recurring).  Feel dizzy.  Have swelling in your ankles.  Have trouble with your vision. Get help right away if you:  Get a very bad headache.  Start to feel mixed up (confused).  Feel weak or numb.  Feel faint.  Have very bad pain in your: ? Chest. ? Belly (abdomen).  Throw up more than once.  Have trouble breathing. Summary  Hypertension is another name for high blood pressure.  High blood pressure forces your heart to work harder to pump blood.  For most people, a normal blood pressure is less than 120/80.  Making healthy choices can help lower blood pressure. If your blood pressure does not get lower with healthy choices, you may need to take medicine. This information is not intended to replace advice given to you by your health care provider. Make sure you discuss any questions you have with your health care provider. Document Revised: 11/13/2017 Document Reviewed: 11/13/2017 Elsevier Patient Education  2020 [employer].

## 2020-03-25 ENCOUNTER — Other Ambulatory Visit: Payer: Self-pay | Admitting: Nurse Practitioner

## 2020-03-25 MED ORDER — ONETOUCH ULTRA VI STRP: ORAL_STRIP | 3 refills | Status: DC

## 2020-03-25 MED ORDER — ONETOUCH ULTRA 2 W/DEVICE KIT
PACK | 0 refills | Status: DC
Start: 2020-03-25 — End: 2021-06-26

## 2020-03-25 MED ORDER — ONETOUCH ULTRASOFT LANCETS MISC: 12 refills | Status: DC

## 2020-03-25 NOTE — Telephone Encounter (Signed)
Can he be sent in a new glucometer kit?

## 2020-03-25 NOTE — Telephone Encounter (Signed)
Have sent in new supplies

## 2020-04-13 ENCOUNTER — Telehealth: Payer: Medicare Other | Admitting: General Practice

## 2020-04-13 ENCOUNTER — Ambulatory Visit: Payer: Self-pay | Admitting: General Practice

## 2020-04-13 DIAGNOSIS — I152 Hypertension secondary to endocrine disorders: Secondary | ICD-10-CM

## 2020-04-13 DIAGNOSIS — E1159 Type 2 diabetes mellitus with other circulatory complications: Secondary | ICD-10-CM

## 2020-04-13 DIAGNOSIS — E1165 Type 2 diabetes mellitus with hyperglycemia: Secondary | ICD-10-CM

## 2020-04-13 NOTE — Patient Instructions (Signed)
Visit Information  Goals Addressed              This Visit's Progress   .  COMPLETED: RN: Ezechiel has blood sugars ranging in 300 to 500        Current Barriers: Closing goal and opening in ELS . Knowledge Deficits related to Diabetes management and keeping blood sugars in a normal range as evidence of hemoglobin A1C o 10.7 at the housecalls visit from [facility] . Film/video editor.  . Literacy barriers . Cognitive Deficits  Nurse Case Manager Clinical Goal(s):  [name] Over the next 120 days, patient will verbalize understanding of plan for making lifestyle changes that will impact his Diabetes diagnosis and decrease his blood glucose levels to optimal level . Over the next 120 days, patient will work with RNCM, pcp, and interdisciplinary team  to address needs related to Diabestes  . Over the next 120 days, patient will demonstrate a decrease in hyperglycemic episodes exacerbations as evidenced by blood sugars consistently <200 and following recommended guidelines established by pcp (per his spouse blood sugar levels have been mostly in 200's but has had  300-500 readings )  . Over the next 120 days, patient will attend all scheduled medical appointments: pcp on 12-30-2019 . Over the next 120 days, patient will demonstrate improved adherence to prescribed treatment plan for diabetes as evidenced byimproved dietary habits related to ADA diet   Interventions:  . Evaluation of current treatment plan related to diabetes managagement  and patient's adherence to plan as established by provider. 12-16-2019: The patient has been having elevated blood sugars and using the bathroom a lot.  States that his blood sugar a few days ago was 500.  Yesterday it was 250 and today it was 130.  The patient is eating a lot of bread.  The patients wife states he has been peeing a lot.  Confirms compliance with medications but his wife states he doesn't always eat right.  . Advised patient to cut back on sugary drinks  and sodas and drink more water, education on flavored water options available.  Per the patients wife he is now only drinking water or flavored water. Praised for accomplishments. 12-16-2019: The patient is still drinking water and staying away from sugary drinks. The patients wife is monitoring his dietary intake.  [name] Discussed plans with patient for ongoing care management follow up and provided patient with direct contact information for care management team . Provided patient with ADA diet educational materials related to Compliance with ADA diet.  Education on watching his diet and adhering to a heart Healthy/ADA diet. The patients wife verbalized that the patient eats a lot of bread. Information given to the patients wife on foods that the patient should avoid or eat in moderation. 12-16-2019: the patient is still eating a lot of bread. He also sometimes goes and gets fast food when the wife does not know about it.  . Advised patient, providing education and rationale, to check cbg BID and record, calling pcp for findings outside established parameters.  Per the patients wife his blood sugars have been 300-500 at times.  12-16-2019: The patient wife states that the patient has been eating a lot of bread. His blood sugars have been up and down. Recently it was up to 500. Last couple of days it has been 130 to 300. HGBA1C is trending down from earlier in the year.  o Lab Results o  Component o Value o Date o   o HGBA1C  o 8.7 (H) o 09/30/2019   . Education on how diabetes can impact other organ systems like the kidneys, eyes and have a negative impact on the patients over all health. Expressed the importance of lifestyle changes to have positive impacts on the patients health.   Patient Self Care Activities:  . Patient verbalizes understanding of plan to work toward managing diabetes more effectively by following a ADA diet and making healthier choices to manage his chronic health conditions . Calls  provider office for new concerns or questions . Unable to independently follow recommendations and adhere to ADA diet and effectively manage diabetes care  Please see past updates related to this goal by clicking on the "Past Updates" button in the selected goal      .  RNCM: Lifestyle Change-Hypertension        Timeframe:  Long-Range Goal Priority:  High Start Date:                             Expected End Date:                       Follow Up Date 06-01-2020   - agree to work together to make changes - ask questions to understand - have a family meeting to talk about healthy habits - learn about high blood pressure   -limit sodium intake  Why is this important?    The changes that you are asked to make may be hard to do.   This is especially true when the changes are life-long.   Knowing why it is important to you is the first step.   Working on the change with your family or support person helps you not feel alone.   Reward yourself and family or support person when goals are met. This can be an activity you choose like bowling, hiking, biking, swimming or shooting hoops.     Notes: Per the patients wife the patient is using a lot of salt    .  RNCM: Monitor and Manage My Blood Sugar-Diabetes Type 2        Timeframe:  Long-Range Goal Priority:  High Start Date:                             Expected End Date:       04-17-2021                Follow Up Date 06-01-2020    - check blood sugar at prescribed times - check blood sugar before and after exercise - check blood sugar if I feel it is too high or too low - enter blood sugar readings and medication or insulin into daily log - take the blood sugar log to all doctor visits - take the blood sugar meter to all doctor visits    Why is this important?    Checking your blood sugar at home helps to keep it from getting very high or very low.   Writing the results in a diary or log helps the doctor know how to care for  you.   Your blood sugar log should have the time, date and the results.   Also, write down the amount of insulin or other medicine that you take.   Other information, like what you ate, exercise done and how you were feeling, will also be helpful.  Notes: Education on fasting blood sugars <130 and post prandial <180.  The patient has had some readings 300-500    .  RNCM: Obtain Eye Exam-Diabetes Type 2        Follow Up Date 06-01-2020   - keep appointment with eye doctor Has upcoming appointment with the eye doctor on 04-22-2020   Why is this important?    Eye check-ups are important when you have diabetes.   Vision loss can be prevented.        .  COMPLETED: RNCM: pt's wife-"[place] has not been taking his blood pressure, he needs new batteries" (pt-stated)        CARE PLAN ENTRY (see longtitudinal plan of care for additional care plan information)  Current Barriers: Closing this goal and opening and opening in new ELS . 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 Work with the care management team to address educational, disease management, and care coordination needs  . Begin or continue self health monitoring activities as directed today Measure and record blood pressure 3 times per week and adhering to a heart heatlhy/ADA/PKU free diet . Call provider office for new or worsened signs and symptoms Blood glucose findings outside established parameters, Blood pressure findings outside established parameters, Oxygen saturation lower than established parameter, Shortness of breath, and New or worsened symptom related to chronic conditions . Call care management team with questions or concerns . Verbalize basic understanding of patient centered plan of care established today  Interventions related to HTN ,PKU and  history of CVA:  . Evaluation of current treatment plans and patient's adherence to plan as established by provider. The patient is more compliant with the plan of care currently. Per wife he has made positive changes. The patient has a cognitive deficit but his wife does what she can to help the patient manage his health and well being.  . Assessed patient understanding of disease states. 12-16-2019: The patients wife states the patient has been having burning sensation when going to the bathroom. The patient told his wife he had kidney stones before. The patient has been going to the bathroom a lot more. Likely the reason as his blood glucose is out of control. Education and support given.  . Assessed patient's education and care coordination needs.  Education on following a heart healthy/ADA diet.  Currently the patient is doing well at managing his diabetes and eating healthy foods and not using salt per his wife. 12-16-2019: Ongoing education and support related to dietary restrictions. The patients wife states she monitors his dietary intake as much as possible but sometimes he does go and gets fast food and eats things that are not good for him. Education on the importance of monitoring dietary intake more closely and eating better to help with management of his chronic health conditions.  . Provided disease specific education to patient.  The patient needs constant reminders to follow restricted diet of low sodium, low sugar content and food/beverage without PKU . Collaborated with appropriate clinical care team members regarding patient needs . Evaluation of upcoming appointments. The patient sees the pcp on 12-30-2019. Education on bringing calendar with readings to show the pcp at the appointment.   Patient Self Care Activities related to HTN ,PKU and history of CVA . Patient is unable to independently self-manage  chronic health conditions  Please see past updates related to this goal by clicking  on the "Past Updates" button in the selected goal         The patient verbalized understanding of instructions, educational materials, and care plan provided today and declined offer to receive copy of patient instructions, educational materials, and care plan.   Telephone follow up appointment with care management team member scheduled for: 06-01-2020 at 2:30 pm  Grant, MSN, [facility] Mobile: [phone]

## 2020-04-13 NOTE — Chronic Care Management (AMB) (Signed)
Chronic Care Management   CCM RN Visit Note  04/13/2020 Name: [name]. MRN: [id number] DOB: [birth year]  Subjective: [name]. is a 60 y.o. year old male who is a primary care patient of Cannady, Barbaraann Faster, NP. The care management team was consulted for assistance with disease management and care coordination needs.    Engaged with patient by telephone for follow up visit in response to provider referral for case management and/or care coordination services.   Consent to Services:  The patient was given information about Chronic Care Management services, agreed to services, and gave verbal consent prior to initiation of services.  Please see initial visit note for detailed documentation.   Patient agreed to services and verbal consent obtained.   Assessment: Review of patient past medical history, allergies, medications, health status, including review of consultants reports, laboratory and other test data, was performed as part of comprehensive evaluation and provision of chronic care management services.   SDOH (Social Determinants of Health) assessments and interventions performed:    CCM Care Plan  Allergies  Allergen Reactions   Bee Venom Hives    All kinds of bees   Other     Certain powders   Sulfa Antibiotics     Outpatient Encounter Medications as of 04/13/2020  Medication Sig   acetaminophen (TYLENOL) 325 MG tablet Take 1-2 tablets (325-650 mg total) by mouth every 4 (four) hours as needed for mild pain. (Patient taking differently: Take 325-650 mg by mouth every 4 (four) hours as needed for mild pain. )   albuterol (PROAIR HFA) 108 (90 Base) MCG/ACT inhaler INHALE 2 PUFFS BY MOUTH EVERY 6 HOURS IF NEEDED FOR COUGH OR WHEEZING.   aspirin 81 MG EC tablet Take 1 tablet (81 mg total) by mouth daily.   atorvastatin (LIPITOR) 80 MG tablet Take 1 tablet (80 mg total) by mouth daily.   Blood Glucose Monitoring Suppl (ONE TOUCH ULTRA 2) w/Device  KIT Use to check blood sugar 4 times a day   BREO ELLIPTA 100-25 MCG/INH AEPB INHALE 1 PUFF BY MOUTH ONCE DAILY   clobetasol cream (TEMOVATE) 4.13 % Apply 1 application topically 2 (two) times daily. Apply to affected area   clopidogrel (PLAVIX) 75 MG tablet Take 1 tablet by mouth daily.   Dextran 70-Hypromellose (ARTIFICIAL TEARS) 0.1-0.3 % SOLN Apply 1 drop to eye 3 (three) times daily. To right eye only.   diclofenac Sodium (VOLTAREN) 1 % GEL Apply 2 g topically 3 (three) times daily.   empagliflozin (JARDIANCE) 25 MG TABS tablet Take 1 tablet (25 mg total) by mouth daily.   fenofibrate 54 MG tablet Take 1 tablet (54 mg total) by mouth daily.   gabapentin (NEURONTIN) 300 MG capsule TAKE 1 CAPSULE BY MOUTH TWICE DAILY   glucose blood (ONETOUCH ULTRA) test strip To check blood sugar three times daily   insulin degludec (TRESIBA FLEXTOUCH) 100 UNIT/ML FlexTouch Pen INJECT 40 UNITS SUBQ ONCE DAILY   Insulin Pen Needle (PEN NEEDLES) 32G X 4 MM MISC 1 kit by Does not apply route daily.   Lancets (ONETOUCH ULTRASOFT) lancets Use as instructed   lisinopril (ZESTRIL) 20 MG tablet Take 1 tablet (20 mg total) by mouth daily.   montelukast (SINGULAIR) 10 MG tablet TAKE 1 TABLET BY MOUTH ONCE EVERY EVENING AT BEDTIME.   nitroGLYCERIN (NITROSTAT) 0.4 MG SL tablet Place 1 tablet (0.4 mg total) under the tongue every 5 (five) minutes as needed for chest pain. (Patient taking differently: Place  0.4 mg under the tongue every 5 (five) minutes as needed for chest pain. )   omeprazole (PRILOSEC) 20 MG capsule Take 1 capsule (20 mg total) by mouth daily.   polyethylene glycol (MIRALAX) 17 g packet Take 17 g by mouth daily.   tiZANidine (ZANAFLEX) 2 MG tablet TAKE 1 TABLET BY MOUTH EVERY 6 HOURS AS NEEDED MUSCLE SPASMS   trimethoprim-polymyxin b (POLYTRIM) ophthalmic solution Place 1 drop into the right eye every 6 (six) hours.    Vitamin D, Ergocalciferol, (DRISDOL) 1.25 MG (50000 UNIT) CAPS  capsule TAKE 1 CAPSULE BY MOUTH ONCE A WEEK   No facility-administered encounter medications on file as of 04/13/2020.    Patient Active Problem List   Diagnosis Date Noted   Type 2 diabetes mellitus with proteinuria ([place]) 03/25/2019   Atherosclerosis of both carotid arteries 03/22/2019   History of CVA (cerebrovascular accident) 03/06/2019   Slow transit constipation    Diplopia    Uncontrolled type 2 diabetes mellitus with hyperglycemia ([place])    Obesity 09/03/2018   Skin tags, multiple acquired 08/23/2016   Frequent falls 05/15/2016   Vitamin D deficiency 08/22/2015   Carpal tunnel syndrome on left 05/12/2015   External hemorrhoids 12/20/2014   Ectatic abdominal aorta ([place]) 10/26/2014   Allergic rhinitis 10/21/2014   Diverticulosis 10/21/2014   Hypertension associated with diabetes ([place]) 10/21/2014   Hyperlipidemia associated with type 2 diabetes mellitus ([place]) 10/21/2014   Asthma 10/21/2014   GERD (gastroesophageal reflux disease) 10/21/2014   NAFLD (nonalcoholic fatty liver disease) 10/21/2014   Arthritis 10/21/2014   Chest pain with high risk for cardiac etiology 10/21/2014   Pancreatitis 10/07/2014   Phenylketonuria (PKU) ([place]) 10/07/2014   Renal cyst, left 10/07/2014   Gall bladder polyp 10/01/2013    Conditions to be addressed/monitored:HTN and DMII  Care Plan : RNCM: Diabetes Type 2 (Adult)  Updates made by Vanita Ingles since 04/13/2020 12:00 AM    Problem: RNCM: Glycemic Management (Diabetes, Type 2)   Priority: High    Goal: RNCM: Glycemic Management Optimized   Priority: High  Note:   Objective:  Lab Results  Component Value Date   HGBA1C 9.4 (H) 02/24/2020    Lab Results  Component Value Date   CREATININE 1.21 02/24/2020   CREATININE 1.21 12/30/2019   CREATININE 1.09 09/30/2019     No results found for: EGFR Current Barriers:   Knowledge Deficits related to basic Diabetes pathophysiology and self  care/management  Knowledge Deficits related to medications used for management of diabetes  Financial Constraints  Literacy barriers  Cognitive Deficits  Does not use cbg meter   Limited Social Support  Unable to independently manage DM  Does not adhere to provider recommendations re: Heart healthy/ADA  Does not adhere to prescribed medication regimen  Lacks social connections  Does not contact provider office for questions/concerns Case Manager Clinical Goal(s):   Collaboration with Marnee Guarneri T, NP regarding development and update of comprehensive plan of care as evidenced by provider attestation and co-signature  Inter-disciplinary care team collaboration (see longitudinal plan of care)  Over the next 120 days, patient will demonstrate improved adherence to prescribed treatment plan for diabetes self care/management as evidenced by:   daily monitoring and recording of CBG   adherence to ADA/ carb modified diet  exercise 4/5 days/week  adherence to prescribed medication regimen Interventions:   Provided education to patient about basic DM disease process  Reviewed medications with patient and discussed importance of medication adherence  Discussed plans  with patient for ongoing care management follow up and provided patient with direct contact information for care management team  Provided patient with written educational materials related to hypo and hyperglycemia and importance of correct treatment  Advised patient, providing education and rationale, to check cbg BID and record, calling pcp for findings outside established parameters.    Review of patient status, including review of consultants reports, relevant laboratory and other test results, and medications completed. Patient Goals/Self-Care Activities  Over the next 120 days, patient will:  - UNABLE to independently manage DM Self administers oral medications as prescribed Attends all scheduled  provider appointments Checks blood sugars as prescribed and utilize hyper and hypoglycemia protocol as needed Adheres to prescribed ADA/carb modified - barriers to adherence to treatment plan identified - blood glucose monitoring encouraged - blood glucose readings reviewed - individualized medical nutrition therapy provided - mutual A1C goal set or reviewed - resources required to improve adherence to care identified - self-awareness of signs/symptoms of hypo or hyperglycemia encouraged - use of blood glucose monitoring log promoted Follow Up Plan: Telephone follow up appointment with care management team member scheduled for: 06-01-2020 at 2:30 pm   Task: RNCM: Alleviate Barriers to Glycemic Management   Note:   Care Management Activities:    - barriers to adherence to treatment plan identified - blood glucose monitoring encouraged - blood glucose readings reviewed - individualized medical nutrition therapy provided - mutual A1C goal set or reviewed - resources required to improve adherence to care identified - self-awareness of signs/symptoms of hypo or hyperglycemia encouraged - use of blood glucose monitoring log promoted       Care Plan : RNCM: Hypertension (Adult)  Updates made by Vanita Ingles since 04/13/2020 12:00 AM    Problem: RNCM: Hypertension (Hypertension)   Priority: Medium    Goal: RNCM: Hypertension Monitored   Priority: Medium  Note:   Objective:   Last practice recorded BP readings:  BP Readings from Last 3 Encounters:  02/24/20 114/69  12/30/19 112/71  09/30/19 118/79     Most recent eGFR/CrCl: No results found for: EGFR  No components found for: CRCL Current Barriers:   Knowledge Deficits related to basic understanding of hypertension pathophysiology and self care management  Knowledge Deficits related to understanding of medications prescribed for management of hypertension  Non-adherence to prescribed medication regimen  Literacy  barriers  Cognitive Deficits  Limited Social Support  Unable to independently manage HTN  Unable to self administer medications as prescribed  Does not adhere to provider recommendations re: refraining from salt use in diet  Lacks social connections  Does not contact provider office for questions/concerns Case Manager Clinical Goal(s):   Over the next 120 days, patient will verbalize understanding of plan for hypertension management  Over the next 120 days, patient will attend all scheduled medical appointments: several upcoming appointments with specialist   Over the next 120 days, patient will demonstrate improved adherence to prescribed treatment plan for hypertension as evidenced by taking all medications as prescribed, monitoring and recording blood pressure as directed, adhering to low sodium/DASH diet  Over the next 120 days, patient will demonstrate improved health management independence as evidenced by checking blood pressure as directed and notifying PCP if SBP>160 or DBP > 90, taking all medications as prescribe, and adhering to a low sodium diet as discussed. Interventions:   Collaboration with [name], NP regarding development and update of comprehensive plan of care as evidenced by provider attestation and co-signature  Inter-disciplinary care team collaboration (see longitudinal plan of care)  Evaluation of current treatment plan related to hypertension self management and patient's adherence to plan as established by provider.  Provided education to patient re: stroke prevention, s/s of heart attack and stroke, DASH diet, complications of uncontrolled blood pressure  Reviewed medications with patient and discussed importance of compliance  Discussed plans with patient for ongoing care management follow up and provided patient with direct contact information for care management team  Advised patient, providing education and rationale, to monitor blood  pressure daily and record, calling PCP for findings outside established parameters.  Patient Goals/Self-Care Activities  Over the next 120 days, patient will:  - UNABLE to independently manage HTN Self administers medications as prescribed Attends all scheduled provider appointments Calls provider office for new concerns, questions, or BP outside discussed parameters Checks BP and records as discussed Follows a low sodium diet/DASH diet - blood pressure trends reviewed - depression screen reviewed - home or ambulatory blood pressure monitoring encouraged Follow Up Plan: Telephone follow up appointment with care management team member scheduled for: 06-01-2020 at 2:30 pm   Task: RNCM: Identify and Monitor Blood Pressure Elevation   Note:   Care Management Activities:    - blood pressure trends reviewed - depression screen reviewed - home or ambulatory blood pressure monitoring encouraged         Plan:Telephone follow up appointment with care management team member scheduled for:  06-01-2020 at 2:30 pm  [place], MSN, [facility] Mobile: [phone]

## 2020-04-19 DIAGNOSIS — H16049 Marginal corneal ulcer, unspecified eye: Secondary | ICD-10-CM | POA: Diagnosis not present

## 2020-04-19 DIAGNOSIS — H00029 Hordeolum internum unspecified eye, unspecified eyelid: Secondary | ICD-10-CM | POA: Diagnosis not present

## 2020-04-23 ENCOUNTER — Other Ambulatory Visit: Payer: Self-pay | Admitting: Nurse Practitioner

## 2020-05-04 ENCOUNTER — Ambulatory Visit (INDEPENDENT_AMBULATORY_CARE_PROVIDER_SITE_OTHER): Payer: Medicare Other | Admitting: Pharmacist

## 2020-05-04 DIAGNOSIS — R809 Proteinuria, unspecified: Secondary | ICD-10-CM | POA: Diagnosis not present

## 2020-05-04 DIAGNOSIS — E1159 Type 2 diabetes mellitus with other circulatory complications: Secondary | ICD-10-CM

## 2020-05-04 DIAGNOSIS — E1129 Type 2 diabetes mellitus with other diabetic kidney complication: Secondary | ICD-10-CM | POA: Diagnosis not present

## 2020-05-04 DIAGNOSIS — F4321 Adjustment disorder with depressed mood: Secondary | ICD-10-CM

## 2020-05-04 DIAGNOSIS — I152 Hypertension secondary to endocrine disorders: Secondary | ICD-10-CM

## 2020-05-04 DIAGNOSIS — E1165 Type 2 diabetes mellitus with hyperglycemia: Secondary | ICD-10-CM | POA: Diagnosis not present

## 2020-05-04 DIAGNOSIS — Z8673 Personal history of transient ischemic attack (TIA), and cerebral infarction without residual deficits: Secondary | ICD-10-CM

## 2020-05-04 NOTE — Progress Notes (Signed)
Chronic Care Management Pharmacy Note  05/16/2020 Name:  [name]. MRN:  [id number] DOB:  [birth year]  Subjective: [name]. is an 60 y.o. year old male who is a primary patient of Cannady, Barbaraann Faster, NP.  The CCM team was consulted for assistance with disease management and care coordination needs.    Engaged with patient by telephone for follow up visit in response to provider referral for pharmacy case management and/or care coordination services.   Consent to Services:  The patient was given information about Chronic Care Management services, agreed to services, and gave verbal consent prior to initiation of services.  Please see initial visit note for detailed documentation.   Patient Care Team: [name], NP as PCP - General (Nurse Practitioner) Anell Barr, [place] (Optometry) Dionisio David, MD as Consulting Physician (Cardiology) Greg Cutter, LCSW as Social Worker (Licensed Clinical Social Worker) Hall Busing, Nobie Putnam, RN as Case Manager ([place]) Vladimir Faster, [facility] as Pharmacist (Pharmacist)     Hospital visits: None in previous 6 months  Objective:  Lab Results  Component Value Date   CREATININE 1.21 02/24/2020   BUN 20 02/24/2020   GFRNONAA 66 02/24/2020   GFRAA 76 02/24/2020   NA 136 02/24/2020   K 4.8 02/24/2020   CALCIUM 9.1 02/24/2020   CO2 23 02/24/2020    Lab Results  Component Value Date/Time   HGBA1C 9.4 (H) 02/24/2020 04:21 PM   HGBA1C 9.4 (H) 12/30/2019 02:35 PM   MICROALBUR 10 07/17/2019 10:24 AM   MICROALBUR 80 (H) 03/25/2019 09:16 AM   MICROALBUR 20 08/22/2015 05:04 PM    Last diabetic Eye exam:  Lab Results  Component Value Date/Time   HMDIABEYEEXA No Retinopathy 07/01/2017 12:00 AM    Last diabetic Foot exam: No results found for: HMDIABFOOTEX   Lab Results  Component Value Date   CHOL 150 12/30/2019   HDL 40 12/30/2019   LDLCALC 85 12/30/2019   TRIG 144 12/30/2019   CHOLHDL 5.3  03/03/2019    Hepatic Function Latest Ref Rng & Units 12/30/2019 03/25/2019 03/07/2019  Total Protein 6.0 - 8.5 g/dL 7.1 7.4 6.1(L)  Albumin 3.8 - 4.9 g/dL 4.6 4.9 3.4(L)  AST 0 - 40 IU/L _0 ALT 0 - 44 IU/L 25 31 36  Alk Phosphatase 44 - 121 IU/L 130(H) 173(H) 89  Total Bilirubin 0.0 - 1.2 mg/dL 0.7 0.4 0.6  Bilirubin, Direct 0.1 - 0.5 mg/dL - - -    Lab Results  Component Value Date/Time   TSH 0.994 03/25/2019 10:25 AM   TSH 1.350 05/22/2017 11:12 AM    CBC Latest Ref Rng & Units 03/25/2019 03/09/2019 03/07/2019  WBC 3.4 - 10.8 x10E3/uL 6.0 6.9 7.9  Hemoglobin 13.0 - 17.7 g/dL 16.2 16.2 15.8  Hematocrit 37.5 - 51.0 % 48.5 48.3 46.9  Platelets 150 - 450 x10E3/uL 174 189 177    Lab Results  Component Value Date/Time   VD25OH 37.2 12/30/2019 02:41 PM   VD25OH 26.6 (L) 03/25/2019 10:25 AM    Clinical ASCVD: Yes  The ASCVD Risk score Mikey Bussing DC Jr., et al., 2013) failed to calculate for the following reasons:   The patient has a prior MI or stroke diagnosis    Depression screen [facility] 2/9 09/02/2019 10/06/2018 08/19/2018  Decreased Interest 0 0 0  Down, Depressed, Hopeless 0 0 0  PHQ - 2 Score 0 0 0  Altered sleeping - - -  Tired, decreased  energy - - -  Change in appetite - - -  Feeling bad or failure about yourself  - - -  Trouble concentrating - - -  Moving slowly or fidgety/restless - - -  Suicidal thoughts - - -  PHQ-9 Score - - -  Difficult doing work/chores - - -  Some recent data might be hidden       Social History   Tobacco Use  Smoking Status Never Smoker  Smokeless Tobacco Never Used   BP Readings from Last 3 Encounters:  02/24/20 114/69  12/30/19 112/71  09/30/19 118/79   Pulse Readings from Last 3 Encounters:  02/24/20 (!) 58  12/30/19 61  09/30/19 70   Wt Readings from Last 3 Encounters:  02/24/20 181 lb 3.2 oz (82.2 kg)  12/30/19 178 lb 6.4 oz (80.9 kg)  09/30/19 177 lb (80.3 kg)    Assessment/Interventions: Review of patient past  medical history, allergies, medications, health status, including review of consultants reports, laboratory and other test data, was performed as part of comprehensive evaluation and provision of chronic care management services.   SDOH:  (Social Determinants of Health) assessments and interventions performed: No   CCM Care Plan  Allergies  Allergen Reactions  . Bee Venom Hives    All kinds of bees  . Other     Certain powders  . Sulfa Antibiotics     Medications Reviewed Today    Reviewed by [name], NP (Nurse Practitioner) on 02/24/20 at 1638  Med List Status: <None>  Medication Order Taking? Sig Documenting Provider Last Dose Status Informant  acetaminophen (TYLENOL) 325 MG tablet [id number] Yes Take 1-2 tablets (325-650 mg total) by mouth every 4 (four) hours as needed for mild pain.  Patient taking differently: Take 325-650 mg by mouth every 4 (four) hours as needed for mild pain.    AngiulliLavon Paganini, PA-C Taking Active   albuterol [facility] HFA) 108 (90 Base) MCG/ACT inhaler [id number] Yes INHALE 2 PUFFS BY MOUTH EVERY 6 HOURS IF NEEDED FOR COUGH OR WHEEZING. Marnee Guarneri T, NP Taking Active   aspirin 81 MG EC tablet [id number] Yes Take 1 tablet (81 mg total) by mouth daily. Marnee Guarneri T, NP Taking Active   atorvastatin (LIPITOR) 80 MG tablet [id number] Yes Take 1 tablet (80 mg total) by mouth daily. Marnee Guarneri T, NP Taking Active   Blood Glucose Monitoring Suppl (ONE TOUCH ULTRA 2) w/Device KIT [id number] Yes Use to check blood sugar 4 times a day Marnee Guarneri T, NP Taking Active   BREO ELLIPTA 100-25 MCG/INH AEPB [id number] Yes INHALE 1 PUFF BY MOUTH ONCE DAILY Cannady, Jolene T, NP Taking Active   clobetasol cream (TEMOVATE) 0.05 % [id number] Yes Apply 1 application topically 2 (two) times daily. Apply to affected area Angiulli, Lavon Paganini, PA-C Taking Active   clopidogrel (PLAVIX) 75 MG tablet [id number] Yes Take 1 tablet by mouth daily. [provider]  Taking Active   Dextran 70-Hypromellose (ARTIFICIAL TEARS) 0.1-0.3 % SOLN [id number] Yes Apply 1 drop to eye 3 (three) times daily. To right eye only. Marnee Guarneri T, NP Taking Active   diclofenac Sodium (VOLTAREN) 1 % GEL [id number] Yes Apply 2 g topically 3 (three) times daily. Marnee Guarneri T, NP Taking Active   empagliflozin (JARDIANCE) 25 MG TABS tablet [id number] Yes Take 1 tablet (25 mg total) by mouth daily. Marnee Guarneri T, NP Taking Active   fenofibrate 54 MG tablet [id number] Yes Take 1 tablet (54 mg total) by mouth  daily. Marnee Guarneri T, NP Taking Active   gabapentin (NEURONTIN) 300 MG capsule [id number] Yes Take 1 capsule (300 mg total) by mouth 2 (two) times daily. Marnee Guarneri T, NP Taking Active   glucose blood (ONETOUCH ULTRA) test strip [id number] Yes To check blood sugar three times daily Cannady, Jolene T, NP Taking Active   Insulin Pen Needle (PEN NEEDLES) 32G X 4 MM MISC [id number] Yes 1 kit by Does not apply route daily. Marnee Guarneri T, NP Taking Active   Lancets [facility] ULTRASOFT) lancets [id number] Yes Use as instructed [name], NP Taking Active   lisinopril (ZESTRIL) 20 MG tablet [id number] Yes Take 1 tablet (20 mg total) by mouth daily. Marnee Guarneri T, NP Taking Active   montelukast (SINGULAIR) 10 MG tablet [id number] Yes TAKE 1 TABLET BY MOUTH ONCE EVERY EVENING AT BEDTIME. Marnee Guarneri T, NP Taking Active   nitroGLYCERIN (NITROSTAT) 0.4 MG SL tablet [id number] Yes Place 1 tablet (0.4 mg total) under the tongue every 5 (five) minutes as needed for chest pain.  Patient taking differently: Place 0.4 mg under the tongue every 5 (five) minutes as needed for chest pain.    Birdie Sons, MD Taking Active Other  omeprazole (PRILOSEC) 20 MG capsule [id number] Yes Take 1 capsule (20 mg total) by mouth daily. Marnee Guarneri T, NP Taking Active   polyethylene glycol (MIRALAX) 17 g packet [id number] Yes Take 17 g by mouth daily. Lavina Hamman, MD Taking  Active   tiZANidine (ZANAFLEX) 2 MG tablet [id number] Yes TAKE 1 TABLET BY MOUTH EVERY 6 HOURS AS NEEDED MUSCLE SPASMS Cannady, Jolene T, NP Taking Active   TRESIBA FLEXTOUCH 100 UNIT/ML FlexTouch Pen [id number] Yes INJECT 22 UNITS SUBQ ONCE DAILY  Patient taking differently: 32 Units.    Marnee Guarneri T, NP Taking Active   trimethoprim-polymyxin b (POLYTRIM) ophthalmic solution [id number] Yes Place 1 drop into the right eye every 6 (six) hours.  [provider] Taking Active   Vitamin D, Ergocalciferol, (DRISDOL) 1.25 MG (50000 UNIT) CAPS capsule [id number] Yes TAKE 1 CAPSULE BY MOUTH ONCE A WEEK Cannady, Jolene T, NP Taking Active           Patient Active Problem List   Diagnosis Date Noted  . Type 2 diabetes mellitus with proteinuria ([place]) 03/25/2019  . Atherosclerosis of both carotid arteries 03/22/2019  . History of CVA (cerebrovascular accident) 03/06/2019  . Slow transit constipation   . Diplopia   . Uncontrolled type 2 diabetes mellitus with hyperglycemia ([place])   . Obesity 09/03/2018  . Skin tags, multiple acquired 08/23/2016  . Frequent falls 05/15/2016  . Vitamin D deficiency 08/22/2015  . Carpal tunnel syndrome on left 05/12/2015  . External hemorrhoids 12/20/2014  . Ectatic abdominal aorta ([place]) 10/26/2014  . Allergic rhinitis 10/21/2014  . Diverticulosis 10/21/2014  . Hypertension associated with diabetes ([place]) 10/21/2014  . Hyperlipidemia associated with type 2 diabetes mellitus ([place]) 10/21/2014  . Asthma 10/21/2014  . GERD (gastroesophageal reflux disease) 10/21/2014  . NAFLD (nonalcoholic fatty liver disease) 10/21/2014  . Arthritis 10/21/2014  . Chest pain with high risk for cardiac etiology 10/21/2014  . Pancreatitis 10/07/2014  . Phenylketonuria (PKU) ([place]) 10/07/2014  . Renal cyst, left 10/07/2014  . Gall bladder polyp 10/01/2013    Immunization History  Administered Date(s) Administered  . Hepatitis A, Adult 09/26/2016, 05/22/2017  . Hepatitis  B, adult 09/26/2016, 05/22/2017, 11/29/2017  . Influenza,inj,Quad PF,6+ Mos 12/22/2012, 12/21/2013, 11/29/2017, 12/02/2018  . Influenza-Unspecified 11/30/2014, 11/18/2015  . Moderna  Sars-Covid-2 Vaccination 11/16/2019, 12/14/2019, 12/14/2019  . Pneumococcal Polysaccharide-23 12/22/2012, 11/18/2015    Conditions to be addressed/monitored:  Hypertension, Hyperlipidemia, Diabetes, Coronary Artery Disease, GERD, Asthma and h/o CVA  Care Plan : [place]  Updates made by Vladimir Faster, [place] since 05/16/2020 12:00 AM    Problem: Diabetes, HTN,HLD, Asthma, GERD, NAFLD,   Priority: High    Long-Range Goal: Disease Management   This Visit's Progress: Not on track  Priority: High  Note:   Current Barriers:  . Unable to independently monitor therapeutic efficacy . Unable to achieve control of diabetes  . Unable to self administer medications as prescribed . Does not adhere to prescribed medication regimen . Does not maintain contact with provider office . Social and financial barriers  Pharmacist Clinical Goal(s):  [name] Over the next 60 days, patient will achieve adherence to monitoring guidelines and medication adherence to achieve therapeutic efficacy . achieve control of diabetes as evidenced by A1C and SMBG readings . adhere to prescribed medication regimen as evidenced by improved glycemic control . contact provider office for questions/concerns as evidenced notation of same in electronic health record through collaboration with PharmD and provider.    Interventions: . 1:1 collaboration with [name], NP regarding development and update of comprehensive plan of care as evidenced by provider attestation and co-signature . Inter-disciplinary care team collaboration (see longitudinal plan of care) . Comprehensive medication review performed; medication list updated in electronic medical record  Hypertension (BP goal <130/80) -controlled -Current  treatment: . Lisinopril 20 mg qd  -Medications previously tried: NA  -Current home readings: Not checking  -Denies hypotensive/hypertensive symptoms -Educated on BP goals and benefits of medications for prevention of heart attack, stroke and kidney damage; Daily salt intake goal < 2300 mg; Exercise goal of 150 minutes per week; -Counseled to monitor BP at home 2-3 times weekly, document, and provide log at future appointments -Counseled on diet and exercise extensively Recommended to continue current medication  Diabetes (A1c goal <6.5%) -uncontrolled -Current medications: . Tresiba 40 units daily . Jardiance 25 mg qd -Medications previously tried: invokana, metformin, glipizide, avoid glp-1 d/t h/o pancreatitis  -Current home glucose readings . fasting glucose: <200, 478 this morning . post prandial glucose: 200s , 400 last night -Denies hypoglycemic/hyperglycemic symptoms -Current meal patterns: drinks sugar-free flavored water and has been drinking  sweet tea since he ran out , per [name] he is trying to do better with his diet but still wants to eat pasta  . breakfast: instant grits . Lunch: nabs, TV dinners, left overs . dinner: black eyed peas & onion, cabbage, spinach, low fat hot dogs, canned tuna, healthy choice TV dinners . snacks: cookies  -Current exercise: He has an exercise bike and rides it occasionally. He is limited by the pain in his knee due to a cyst. -Educated onA1c and blood sugar goals; Complications of diabetes including kidney damage, retinal damage, and cardiovascular disease; Exercise goal of 150 minutes per week; Benefits of weight loss; Benefits of routine self-monitoring of blood sugar; -Counseled to check feet daily and get yearly eye exams-- He has an eye infection and was placed on doxycycline ([name] has only been giving once daily), erythromycin eye ointment, and gatifloxacin. Per [name] he cannot get his eye exam until this is complete  -Counseled  on diet and exercise extensively Recommended to continue current medication Counseled on checking glucose regularly. Reviewed instructions from PCP visit in December to increase Tresiba dose by 2 units every 3  days FBG . 130. [name] has not been doing this. Damiel states he has not picked up glucometer or supplies from [employer]. He continues to manually prick his finger with lancet due to broken device. Will states [facility] told him " he doesn't need new meter". Likely, his insurance has denied. I will ask CPA to follow up with [employer].    Patient Goals/Self-Care Activities . Over the next 60 days, patient will:  - take medications as prescribed focus on medication adherence by pill box check glucose before meals and at bedtime, document, and provide at future appointments check blood pressure 2-3 times weekly, document, and provide at future appointments target a minimum of 150 minutes of moderate intensity exercise weekly  Follow Up Plan: Telephone follow up appointment with care management team member scheduled for: 60 days        Medication Assistance: None required.  Patient affirms current coverage meets needs.  Patient's preferred pharmacy is:  [facility], [place] [address] Phone: [phone] Fax: [phone]  Uses pill box? Yes Pt endorses 75% compliance  We discussed: Current pharmacy is preferred with insurance plan and patient is satisfied with pharmacy services Patient decided to: Continue current medication management strategy  Care Plan and Follow Up Patient Decision:  Patient agrees to Care Plan and Follow-up.  Plan: Telephone follow up appointment with care management team member scheduled for:  2 months   Junita Push. Kenton Kingfisher PharmD, [facility] [phone]

## 2020-05-11 ENCOUNTER — Telehealth: Payer: Self-pay

## 2020-05-11 ENCOUNTER — Telehealth: Payer: Self-pay | Admitting: General Practice

## 2020-05-11 NOTE — Telephone Encounter (Signed)
Thanks Pam. Sorry to hear this. I spoke with them last week and happy to reach out again if I can be of assistance.

## 2020-05-11 NOTE — Telephone Encounter (Cosign Needed)
  Chronic Care Management   Note  05/11/2020 Name: [name]. MRN: [id number] DOB: 05/13/1960  Voice mail from the patients wife [name] letting the RNCM know that the patients mother had passed away early this am. She ask if the pcp could give something to "calm him down".  In basket message sent to pcp and clinical staff asking for recommendations. Will continue to monitor.   Follow up plan: The care management team will reach out to the patient again over the next 30 to 60 days.   Noreene Larsson RN, MSN, [facility] Mobile: [phone]

## 2020-05-11 NOTE — Telephone Encounter (Signed)
Please express my sincere condolences for their loss, however I would need a visit for this, as would want to ensure we prescribed something safe and that would not cause any fall issues.

## 2020-05-11 NOTE — Telephone Encounter (Signed)
Copied from [place] [id number]. Topic: General - Inquiry >> May 11, 2020  7:27 AM Scherrie Gerlach wrote: Reason for CRM: Wife [name] called to advise pt's mom passed away in the night.  She states he is not doing well and was hoping Jolene could send something into the pharmacy to help him relax.  No appt available w/ Jolene.  Wife states he is resting now but almost collapsed when he got the news. She states she left Pam a VM as well.     Called pt offered an apt stated he would not have time to come by as he is dealing with funeral arangment and will have trouble coming in, Pt declined apt and wanted [name] to make pcp aware of what's going on. S.Ivor Kishi

## 2020-05-12 NOTE — Telephone Encounter (Signed)
Unable to lvm to really he would need apt;

## 2020-05-16 ENCOUNTER — Ambulatory Visit: Payer: Medicare Other | Admitting: Licensed Clinical Social Worker

## 2020-05-16 DIAGNOSIS — F4321 Adjustment disorder with depressed mood: Secondary | ICD-10-CM | POA: Diagnosis not present

## 2020-05-16 DIAGNOSIS — R809 Proteinuria, unspecified: Secondary | ICD-10-CM

## 2020-05-16 DIAGNOSIS — I152 Hypertension secondary to endocrine disorders: Secondary | ICD-10-CM

## 2020-05-16 DIAGNOSIS — E1159 Type 2 diabetes mellitus with other circulatory complications: Secondary | ICD-10-CM

## 2020-05-16 DIAGNOSIS — E1129 Type 2 diabetes mellitus with other diabetic kidney complication: Secondary | ICD-10-CM

## 2020-05-16 DIAGNOSIS — Z8673 Personal history of transient ischemic attack (TIA), and cerebral infarction without residual deficits: Secondary | ICD-10-CM

## 2020-05-16 DIAGNOSIS — E1165 Type 2 diabetes mellitus with hyperglycemia: Secondary | ICD-10-CM

## 2020-05-16 NOTE — Chronic Care Management (AMB) (Signed)
Chronic Care Management    Clinical Social Work Note  05/16/2020 Name: [name]. MRN: [id number] DOB: [birth year]  [name]. is a 60 y.o. year old male who is a primary care patient of Cannady, Barbaraann Faster, NP. The CCM team was consulted to assist the patient with chronic disease management and/or care coordination needs related to: Grief Counseling.   Engaged with patient by telephone for follow up visit in response to provider referral for social work chronic care management and care coordination services.   Consent to Services:  The patient was given the following information about Chronic Care Management services today, agreed to services, and gave verbal consent: 1. CCM service includes personalized support from designated clinical staff supervised by the primary care provider, including individualized plan of care and coordination with other care providers 2. 24/7 contact phone numbers for assistance for urgent and routine care needs. 3. Service will only be billed when office clinical staff spend 20 minutes or more in a month to coordinate care. 4. Only one practitioner may furnish and bill the service in a calendar month. 5.The patient may stop CCM services at any time (effective at the end of the month) by phone call to the office staff. 6. The patient will be responsible for cost sharing (co-pay) of up to 20% of the service fee (after annual deductible is met). Patient agreed to services and consent obtained.  Patient agreed to services and consent obtained.   Assessment: Review of patient past medical history, allergies, medications, and health status, including review of relevant consultants reports was performed today as part of a comprehensive evaluation and provision of chronic care management and care coordination services.     SDOH (Social Determinants of Health) assessments and interventions performed:    Advanced Directives Status: See Care Plan for  related entries.  CCM Care Plan  Allergies  Allergen Reactions  . Bee Venom Hives    All kinds of bees  . Other     Certain powders  . Sulfa Antibiotics     Outpatient Encounter Medications as of 05/16/2020  Medication Sig  . acetaminophen (TYLENOL) 325 MG tablet Take 1-2 tablets (325-650 mg total) by mouth every 4 (four) hours as needed for mild pain. (Patient taking differently: Take 325-650 mg by mouth every 4 (four) hours as needed for mild pain. )  . albuterol (PROAIR HFA) 108 (90 Base) MCG/ACT inhaler INHALE 2 PUFFS BY MOUTH EVERY 6 HOURS IF NEEDED FOR COUGH OR WHEEZING.  [name] aspirin 81 MG EC tablet Take 1 tablet (81 mg total) by mouth daily.  [name] atorvastatin (LIPITOR) 80 MG tablet Take 1 tablet (80 mg total) by mouth daily.  . Blood Glucose Monitoring Suppl (ONE TOUCH ULTRA 2) w/Device KIT Use to check blood sugar 4 times a day  . BREO ELLIPTA 100-25 MCG/INH AEPB INHALE 1 PUFF BY MOUTH ONCE DAILY  . clobetasol cream (TEMOVATE) 3.55 % Apply 1 application topically 2 (two) times daily. Apply to affected area  . clopidogrel (PLAVIX) 75 MG tablet Take 1 tablet by mouth daily.  [name] Dextran 70-Hypromellose (ARTIFICIAL TEARS) 0.1-0.3 % SOLN Apply 1 drop to eye 3 (three) times daily. To right eye only.  . diclofenac Sodium (VOLTAREN) 1 % GEL Apply 2 g topically 3 (three) times daily.  . empagliflozin (JARDIANCE) 25 MG TABS tablet Take 1 tablet (25 mg total) by mouth daily.  . fenofibrate 54 MG tablet Take 1 tablet (54 mg total) by mouth daily.  [name]  gabapentin (NEURONTIN) 300 MG capsule TAKE 1 CAPSULE BY MOUTH TWICE DAILY  . glucose blood (ONETOUCH ULTRA) test strip To check blood sugar three times daily  . insulin degludec (TRESIBA FLEXTOUCH) 100 UNIT/ML FlexTouch Pen INJECT 22 UNITS SUBQ ONCE DAILY  . Insulin Pen Needle (PEN NEEDLES) 32G X 4 MM MISC 1 kit by Does not apply route daily.  . Lancets (ONETOUCH ULTRASOFT) lancets Use as instructed  . lisinopril (ZESTRIL) 20 MG tablet Take 1 tablet  (20 mg total) by mouth daily.  . montelukast (SINGULAIR) 10 MG tablet TAKE 1 TABLET BY MOUTH ONCE EVERY EVENING AT BEDTIME.  . nitroGLYCERIN (NITROSTAT) 0.4 MG SL tablet Place 1 tablet (0.4 mg total) under the tongue every 5 (five) minutes as needed for chest pain. (Patient taking differently: Place 0.4 mg under the tongue every 5 (five) minutes as needed for chest pain. )  . omeprazole (PRILOSEC) 20 MG capsule Take 1 capsule (20 mg total) by mouth daily.  . polyethylene glycol (MIRALAX) 17 g packet Take 17 g by mouth daily.  [name] tiZANidine (ZANAFLEX) 2 MG tablet TAKE 1 TABLET BY MOUTH EVERY 6 HOURS AS NEEDED MUSCLE SPASMS  . trimethoprim-polymyxin b (POLYTRIM) ophthalmic solution Place 1 drop into the right eye every 6 (six) hours.   . Vitamin D, Ergocalciferol, (DRISDOL) 1.25 MG (50000 UNIT) CAPS capsule TAKE 1 CAPSULE BY MOUTH ONCE A WEEK   No facility-administered encounter medications on file as of 05/16/2020.    Patient Active Problem List   Diagnosis Date Noted  . Type 2 diabetes mellitus with proteinuria ([place]) 03/25/2019  . Atherosclerosis of both carotid arteries 03/22/2019  . History of CVA (cerebrovascular accident) 03/06/2019  . Slow transit constipation   . Diplopia   . Uncontrolled type 2 diabetes mellitus with hyperglycemia ([address])   . Obesity 09/03/2018  . Skin tags, multiple acquired 08/23/2016  . Frequent falls 05/15/2016  . Vitamin D deficiency 08/22/2015  . Carpal tunnel syndrome on left 05/12/2015  . External hemorrhoids 12/20/2014  . Ectatic abdominal aorta ([place]) 10/26/2014  . Allergic rhinitis 10/21/2014  . Diverticulosis 10/21/2014  . Hypertension associated with diabetes ([place]) 10/21/2014  . Hyperlipidemia associated with type 2 diabetes mellitus ([place]) 10/21/2014  . Asthma 10/21/2014  . GERD (gastroesophageal reflux disease) 10/21/2014  . NAFLD (nonalcoholic fatty liver disease) 10/21/2014  . Arthritis 10/21/2014  . Chest pain with high risk for cardiac etiology  10/21/2014  . Pancreatitis 10/07/2014  . Phenylketonuria (PKU) ([place]) 10/07/2014  . Renal cyst, left 10/07/2014  . Gall bladder polyp 10/01/2013    Conditions to be addressed/monitored: Anxiety; Mental Health Concerns   Care Plan : General Social Work (Adult)  Updates made by Greg Cutter, LCSW since 05/16/2020 12:00 AM    Problem: Coping Skills (General Plan of Care)     Long-Range Goal: Coping Skills Enhanced   Start Date: 05/16/2020  Note:   Timeframe:  Long-Range Goal Priority:  Medium  Start Date:  05/16/20                         Expected End Date:  08/13/20                    Follow Up Date-06/13/20   Current Barriers:  . Financial constraints . Limited social support . Level of care concerns . ADL IADL limitations . Literacy concerns . Cognitive Deficits . Lacks knowledge of community resource: available financial support resources within the area as  well as socialization opportunities within the area . Grief support related to the recent loss of his mother  Clinical Social Work Clinical Goal(s):  [name] Over the next 120 days, client will work with SW to address concerns related to improving self-care and implementing it whenever able  Interventions: . Patient interviewed and appropriate assessments performed . Patient's mother passed on 27-May-2020 and is experiencing grief symptoms. Patient has had two panic attacks since her passing and contacted front desk about medication to gain relief. PCP informed family that they would need an office visit before prescribing a new medication. Office visit was set for 05/25/20. Patient reports that he is doing better now and may not need mediation assistance. CCM LCSW educated patient on grief counseling through [facility] but patient declined needing this referral at this time. Patient reports that his niece works for a [employer] firm and is assisting family with managing his mother's assets.  . He admits that he isn't sleeping well at  night. LCSW provided education on healthy sleep hygiene and what that looks like. LCSW encouraged patient to implement a night time routine into his schedule that works best for him and that he is able to maintain. Advised patient to implement deep breathing/grounding/meditation/self-care exercises into his nightly routine to combat racing thoughts at night. LCSW encouraged patient to wake up at the same time each day, make her sleeping environment comfortable, exercise when able, to limit naps and to not eat or drink anything right before bed.  . Family is interested in getting food stamps and received a letter in the mail that they will inquire about. Patient has an upcoming financial assistance appointment with [place] on 03/08/20.  [name] Patient reports having a cyst on his knee that may need removing. Patient states that this cyst is causing him pain and irritation. Family reported that patient will be scheduling a nearby procedure for his hernia as well.  . Provided mental health counseling with regard to managing everyday stressors and implementing appropriate self-care techniques in order to improve overall health. LCSW provided reflective listening and implemented appropriate interventions to help suppport patient and his emotional needs. Patient admitted anxiety today due to high blood sugar levels. However, patient checked his blood sugar level before eating anything today. LCSW provided education on healthy nutrition that will improve his self-care and anxiety management.  . Provided patient with information about OTC benefits that he is uses per quarter. Patient's spouse reports that she has started to order diabetic lotion for patient and this is helping him immensely. . Patient reports trying to implement appropriate self-care by exercise and mowing his yard. Patient reports ongoing chronic pain with his knees and side.  . Discussed plans with patient for ongoing care management follow up and  provided patient with direct contact information for care management team . Advised patient to contact CCM embedded practice providers and PCP if any urgent concerns arise. [name] LCSW completed referral for a wheelchair ramp in the past and ramp installation process has been completed.  . Patient reports ongoing chronic pain and difficulty managing this without becoming irritated or ill. Patient was alert, oriented. Patient denies anxiety at this time. Discussed coping skills. SW used active and reflective listening, validated patient's feelings/concerns, and provided emotional support. . Assisted patient/caregiver with obtaining information about health plan benefits . Provided education to patient/caregiver regarding level of care options. . Patient and spouse put up christmas decorations this week in remembrance of their lost family members that  passed recently. Positive reinforcement provided to family for using their creativity within their home.  . Brief self-care education provided to both patient and spouse during outreach today. Positive reinforcement provided for positive self-care implementation.  . Patient has upcoming office visit with PCP on 02/24/20. Family report stable transportation to this appointment.  . Per spouse, patient has worked hard at changing his eating/drinking habits. Patient is no longer drinking soda and drinks a water all day long (with 0 calorie flavor sweeteners that they buy).  . Patient has experienced a few days of high blood sugars (404 today) especially when he has a low appetite and has trouble drinking/eating appropriately. . Patient's dad is currently working on transferring car title from his name to [facility] which family is happy about.   Patient Self Care Activities:  . Attends all scheduled provider appointments . Calls provider office for new concerns or questions  Please see past updates related to this goal by clicking on the "Past Updates" button in the  selected goal        Follow Up Plan: SW will follow up with patient by phone over the next quarter      Eula Fried, [place], MSW, [email or web address] Phone: [phone]

## 2020-05-16 NOTE — Patient Instructions (Addendum)
Visit Information  It was a pleasure speaking with you today. Thank you for letting me be part of your clinical team. Please call with any questions or concerns.   Goals Addressed   None     The patient verbalized understanding of instructions, educational materials, and care plan provided today and agreed to receive a mailed copy of patient instructions, educational materials, and care plan.   The pharmacy team will reach out to the patient again over the next 30 days.   Junita Push. Vi Biddinger PharmD, BCPS Clinical Pharmacist [phone]  Cooking With Less Salt Cooking with less salt is one way to reduce the amount of sodium you get from food. Sodium is one of the elements that make up salt. It is found naturally in foods and is also added to certain foods. Depending on your condition and overall health, your health care provider or dietitian may recommend that you reduce your sodium intake. Most people should have less than 2,300 milligrams (mg) of sodium each day. If you have high blood pressure (hypertension), you may need to limit your sodium to 1,500 mg each day. Follow the tips below to help reduce your sodium intake. What are tips for eating less sodium? Reading food labels  Check the food label before buying or using packaged ingredients. Always check the label for the serving size and sodium content.  Look for products with no more than 140 mg of sodium in one serving.  Check the % Daily Value column to see what percent of the daily recommended amount of sodium is provided in one serving of the product. Foods with 5% or less in this column are considered low in sodium. Foods with 20% or higher are considered high in sodium.  Do not choose foods with salt as one of the first three ingredients on the ingredients list. If salt is one of the first three ingredients, it usually means the item is high in sodium.   Shopping  Buy sodium-free or low-sodium products. Look for the following  words on food labels: ? Low-sodium. ? Sodium-free. ? Reduced-sodium. ? No salt added. ? Unsalted.  Always check the sodium content even if foods are labeled as low-sodium or no salt added.  Buy fresh foods. Cooking  Use herbs, seasonings without salt, and spices as substitutes for salt.  Use sodium-free baking soda when baking.  Grill, braise, or roast foods to add flavor with less salt.  Avoid adding salt to pasta, rice, or hot cereals.  Drain and rinse canned vegetables, beans, and meat before use.  Avoid adding salt when cooking sweets and desserts.  Cook with low-sodium ingredients. What foods are high in sodium? Vegetables Regular canned vegetables (not low-sodium or reduced-sodium). Sauerkraut, pickled vegetables, and relishes. Olives. [place] fries. Onion rings. Regular canned tomato sauce and paste. Regular tomato and vegetable juice. Frozen vegetables in sauces. Grains Instant hot cereals. Bread stuffing, pancake, and biscuit mixes. Croutons. Seasoned rice or pasta mixes. Noodle soup cups. Boxed or frozen macaroni and cheese. Regular salted crackers. Self-rising flour. Rolls. Bagels. Flour tortillas and wraps. Meats and other proteins Meat or fish that is salted, canned, smoked, cured, spiced, or pickled. This includes bacon, ham, sausages, hot dogs, corned beef, chipped beef, meat loaves, salt pork, jerky, pickled herring, anchovies, regular canned tuna, and sardines. Salted nuts. Dairy Processed cheese and cheese spreads. Cheese curds. Blue cheese. Feta cheese. String cheese. Regular cottage cheese. Buttermilk. Canned milk. The items listed above may not be a complete list  of foods high in sodium. Actual amounts of sodium may be different depending on processing. Contact a dietitian for more information. What foods are low in sodium? Fruits Fresh, frozen, or canned fruit with no sauce added. Fruit juice. Vegetables Fresh or frozen vegetables with no sauce added. "No  salt added" canned vegetables. "No salt added" tomato sauce and paste. Low-sodium or reduced-sodium tomato and vegetable juice. Grains Noodles, pasta, quinoa, rice. Shredded or puffed wheat or puffed rice. Regular or quick oats (not instant). Low-sodium crackers. Low-sodium bread. Whole-grain bread and whole-grain pasta. Unsalted popcorn. Meats and other proteins Fresh or frozen whole meats, poultry (not injected with sodium), and fish with no sauce added. Unsalted nuts. Dried peas, beans, and lentils without added salt. Unsalted canned beans. Eggs. Unsalted nut butters. Low-sodium canned tuna or chicken. Dairy Milk. Soy milk. Yogurt. Low-sodium cheeses, such as Swiss, Monterey Jack, Grover, and [employer]. Sherbet or ice cream (keep to  cup per serving). Cream cheese. Fats and oils Unsalted butter or margarine. Other foods Homemade pudding. Sodium-free baking soda and baking powder. Herbs and spices. Low-sodium seasoning mixes. Beverages Coffee and tea. Carbonated beverages. The items listed above may not be a complete list of foods low in sodium. Actual amounts of sodium may be different depending on processing. Contact a dietitian for more information. What are some salt alternatives when cooking? The following are herbs, seasonings, and spices that can be used instead of salt to flavor your food. Herbs should be fresh or dried. Do not choose packaged mixes. Next to the name of the herb, spice, or seasoning are some examples of foods you can pair it with. Herbs  Bay leaves - Soups, meat and vegetable dishes, and spaghetti sauce.  Basil - [employer], soups, pasta, and fish dishes.  Cilantro - Meat, poultry, and vegetable dishes.  Chili powder - Marinades and Mexican dishes.  Chives - Salad dressings and potato dishes.  Cumin - Mexican dishes, couscous, and meat dishes.  Dill - Fish dishes, sauces, and salads.  Fennel - Meat and vegetable dishes, breads, and cookies.  Garlic  (do not use garlic salt) - [place] dishes, meat dishes, salad dressings, and sauces.  Marjoram - Soups, potato dishes, and meat dishes.  Oregano - Pizza and spaghetti sauce.  Parsley - Salads, soups, pasta, and meat dishes.  Rosemary - [place] dishes, salad dressings, soups, and red meats.  Saffron - Fish dishes, pasta, and some poultry dishes.  Sage - Stuffings and sauces.  Tarragon - Fish and [name].  Thyme - Stuffing, meat, and fish dishes. Seasonings  Lemon juice - Fish dishes, poultry dishes, vegetables, and salads.  Vinegar - Salad dressings, vegetables, and fish dishes. Spices  Cinnamon - Sweet dishes, such as cakes, cookies, and puddings.  Cloves - Gingerbread, puddings, and marinades for meats.  Curry - Vegetable dishes, fish and poultry dishes, and stir-fry dishes.  Ginger - Vegetable dishes, fish dishes, and stir-fry dishes.  Nutmeg - Pasta, vegetables, poultry, fish dishes, and custard. Summary  Cooking with less salt is one way to reduce the amount of sodium that you get from food.  Buy sodium-free or low-sodium products.  Check the food label before using or buying packaged ingredients.  Use herbs, seasonings without salt, and spices as substitutes for salt in foods. This information is not intended to replace advice given to you by your health care provider. Make sure you discuss any questions you have with your health care provider. Document Revised: 02/25/2019 Document Reviewed: 02/25/2019  Elsevier Patient Education  2021 [employer].

## 2020-05-23 ENCOUNTER — Ambulatory Visit: Payer: Self-pay

## 2020-05-23 ENCOUNTER — Other Ambulatory Visit: Payer: Self-pay

## 2020-05-23 ENCOUNTER — Encounter: Payer: Self-pay | Admitting: Nurse Practitioner

## 2020-05-23 ENCOUNTER — Ambulatory Visit (INDEPENDENT_AMBULATORY_CARE_PROVIDER_SITE_OTHER): Payer: Medicare Other | Admitting: Nurse Practitioner

## 2020-05-23 VITALS — BP 112/72 | HR 55 | Temp 98.1°F | Wt 178.0 lb

## 2020-05-23 DIAGNOSIS — R55 Syncope and collapse: Secondary | ICD-10-CM | POA: Insufficient documentation

## 2020-05-23 DIAGNOSIS — W19XXXA Unspecified fall, initial encounter: Secondary | ICD-10-CM

## 2020-05-23 DIAGNOSIS — R079 Chest pain, unspecified: Secondary | ICD-10-CM

## 2020-05-23 NOTE — Telephone Encounter (Signed)
Pt. And wife report pt. Was out yesterday at [employer] and became dizzy and fell.Did not lose consciousness. Did not hit head.States his left hand and elbow are sore and he has abrasions. Appointment per Collene Mares in the practice.  Reason for Disposition  [1] MODERATE dizziness (e.g., interferes with normal activities) AND [2] has NOT been evaluated by physician for this  (Exception: dizziness caused by heat exposure, sudden standing, or poor fluid intake)  Answer Assessment - Initial Assessment Questions 1. DESCRIPTION: "Describe your dizziness."     Lightheaded 2. LIGHTHEADED: "Do you feel lightheaded?" (e.g., somewhat faint, woozy, weak upon standing)     Dizzy 3. VERTIGO: "Do you feel like either you or the room is spinning or tilting?" (i.e. vertigo)     No 4. SEVERITY: "How bad is it?"  "Do you feel like you are going to faint?" "Can you stand and walk?"   - MILD: Feels slightly dizzy, but walking normally.   - MODERATE: Feels very unsteady when walking, but not falling; interferes with normal activities (e.g., school, work) .   - SEVERE: Unable to walk without falling, or requires assistance to walk without falling; feels like passing out now.      None today 5. ONSET:  "When did the dizziness begin?"     Yesterday 6. AGGRAVATING FACTORS: "Does anything make it worse?" (e.g., standing, change in head position)     No 7. HEART RATE: "Can you tell me your heart rate?" "How many beats in 15 seconds?"  (Note: not all patients can do this)       No 8. CAUSE: "What do you think is causing the dizziness?"     Unsure 9. RECURRENT SYMPTOM: "Have you had dizziness before?" If Yes, ask: "When was the last time?" "What happened that time?"     Yes 10. OTHER SYMPTOMS: "Do you have any other symptoms?" (e.g., fever, chest pain, vomiting, diarrhea, bleeding)       No 11. PREGNANCY: "Is there any chance you are pregnant?" "When was your last menstrual period?"       n/a  Protocols used:  DIZZINESS [facility]

## 2020-05-23 NOTE — Patient Instructions (Addendum)
It was great to see you!  With your fall, we will get x-rays of your left wrist and a CT of your head. You may use ice for 20 minutes at a time 4 times a day. You can take ibuprofen or tylenol as needed for pain. Take 1/2 tablet of your lisinopril once a day because your blood pressure is on the lower side. Call Dr. Melrose Nakayama and set up an appointment with him (neurology).    Take care,  Vance Peper, NP   Wrist Pain, Adult There are many things that can cause wrist pain. Some common causes include:  An injury to the wrist.  Using the joint too much.  A condition that causes too much pressure to be put on a nerve in the wrist (carpal tunnel syndrome).  Wear and tear of the joints that happens as a person gets older (osteoarthritis).  A condition that causes swelling and stiffness in the joints (arthritis). Sometimes, the cause of wrist pain is not known. Often, the pain goes away when you follow your doctor's instructions for easing pain at home. This may include resting your wrist, icing your wrist, or using a splint or an elastic wrap for a short time. It is important to tell your doctor if your wrist pain does not go away. Follow these instructions at home: If you have a splint or elastic wrap:  Wear the splint or wrap as told by your doctor. Take it off only as told by your doctor. Ask if you can take it off for bathing.  Loosen the splint or wrap if your fingers: ? Tingle. ? Become numb. ? Turn cold and blue.  Check the skin around the splint or wrap every day. Tell your doctor about any concerns.  Keep the splint or wrap clean.  If the splint or wrap is not waterproof: ? Do not let it get wet. ? Cover it with a watertight covering when you take a bath or shower. Managing pain, stiffness, and swelling  If told, put ice on the painful area. To do this: ? If you have a removable splint or wrap, take it off as told by your doctor. ? Put ice in a plastic bag. ? Place a  towel between your skin and the bag or between your splint or wrap and the bag. ? Leave the ice on for 20 minutes, 2-3 times a day.  Move your fingers often.  Raise (elevate) the injured area above the level of your heart while you are sitting or lying down.   Activity  Rest your wrist as told by your doctor.  Return to your normal activities as told by your doctor. Ask your doctor what activities are safe for you.  Ask your doctor when it is safe to drive if you have a splint or wrap on your wrist.  Do exercises as told by your doctor. General instructions  Pay attention to any changes in your symptoms.  Take over-the-counter and prescription medicines only as told by your doctor.  Keep all follow-up visits as told by your doctor. This is important. Contact a doctor if:  You have a sudden, sharp pain in the wrist, hand, or arm that is different or new.  The swelling or bruising on your wrist or hand gets worse.  Your skin: ? Becomes red. ? Gets a rash. ? Has open sores.  Your pain does not get better.  Your pain gets worse.  You have a fever or chills. Get  help right away if:  You lose feeling in your fingers or hand.  Your fingers turn white, very red, or cold and blue.  You cannot move your fingers. Summary  There are many things that can cause wrist pain.  It is important to tell your doctor if your wrist pain does not go away.  You may need to wear a splint or a wrap for a short period of time.  Return to your normal activities as told by your doctor. Ask your doctor what activities are safe for you. This information is not intended to replace advice given to you by your health care provider. Make sure you discuss any questions you have with your health care provider. Document Revised: 01/22/2019 Document Reviewed: 01/22/2019 Elsevier Patient Education  2021 [employer].

## 2020-05-23 NOTE — Assessment & Plan Note (Addendum)
Had a syncopal episode on 05/22/20. Stated he got dizzy and doesn't remember falling, then came to when someone helped him up. Pain, swelling, and limited ROM to left wrist. Will check wrist x-ray. He can use ice and take ibuprofen or tylenol as needed for pain. Blood pressure noted to be 104/58 sitting and standing manually. Will decrease his lisinopril to 29m daily. He has a history of stroke which presented as dizziness and weakness. Neuro examination WNL, decreased strength to left wrist with injury from fall. Will check CT head to r/o stroke and also because he does not remember if he hit his head. EKG obtained which showed sinus bradycardia 58 bpm. Encouraged increasing fluids. Recommend he schedules a follow-up with cardiology (Dr. KHumphrey Rolls and neurology (Dr. PMelrose Nakayama. Check CMP and CBC. Keep follow-up appointment on Thursday 05/24/20. If he has another episode of dizziness/syncope, go to the ER.

## 2020-05-23 NOTE — Progress Notes (Signed)
EKG interpreted by me on 05/23/20 showed sinus bradycardia with a heart rate of 58 bpm.

## 2020-05-23 NOTE — Progress Notes (Signed)
Established Patient Office Visit  Subjective:  Patient ID: Alex Iwan., male    DOB: Sep 23, 1960  Age: 60 y.o. MRN: [id number]  CC:  Chief Complaint  Patient presents with  . Wrist Injury    Patient states he fell 2 days ago and hurt his wrist. Patient states it is painful.     HPI Alex Ake. presents for fall on 05/22/20. He stated he was at [employer] and got dizzy suddenly and then woke up on the ground with an employee trying to help him up. He does not remember falling.   HAND PAIN/FALL  Duration: days Involved hand: left hand/wrist Mechanism of injury: trauma, had fall, doesn't remember if hand was outstretched Onset: sudden Severity: 7/10 worse with pressure on it Quality: sharp Frequency: constant Radiation: no Aggravating factors: pressure on his hand/wrist Alleviating factors: unknown Treatments attempted: none Relief with NSAIDs?: No NSAIDs Taken Weakness: no Numbness: no Redness: yes Swelling:yes Bruising: no Fevers: no   CHEST PAIN  Had an episode of chest pain last week. He took a nitro SL and it resolved. He follows with Dr. Humphrey Rolls with cardiology and last appointment was December 2021. He is waiting to schedule an echocardiogram. Currently chest pain free.  Past Medical History:  Diagnosis Date  . Arrhythmia   . Asthma   . Cyst of kidney, acquired   . Diabetes mellitus without complication ([place]) 9233   type 2  . GERD (gastroesophageal reflux disease)   . History of chicken pox   . History of measles as a child   . History of PKU   . Hyperlipidemia   . Hypertension   . IBS (irritable bowel syndrome)   . Irregular heart beat   . Mentally challenged   . Pancreatitis   . Stroke [facility]) 03/02/2019    Past Surgical History:  Procedure Laterality Date  . Cardiac Catherization     [facility]  . CARDIAC CATHETERIZATION     [facility]  . COLONOSCOPY    . ESOPHAGOGASTRODUODENOSCOPY (EGD) WITH PROPOFOL N/A 12/27/2016   Procedure:  ESOPHAGOGASTRODUODENOSCOPY (EGD) WITH PROPOFOL;  Surgeon: Lin Landsman, MD;  Location: [place];  Service: Gastroenterology;  Laterality: N/A;  . HEMORRHOID SURGERY    . Ligament Removal Left    of left thumb: dr. Cleda Mccreedy  . ligament removal  of left thumb     Dr. Cleda Mccreedy  . NM GATED MYOCARDIAL STUDY (ARMX HX)  06/23/2014   Paraschos. Normal    Family History  Problem Relation Age of Onset  . Cancer Mother        throat  . Diabetes Father   . Heart disease Father   . Cancer Maternal Grandmother   . Cancer Maternal Grandfather        pancreatic  . Cancer Paternal Grandfather     Social History   Socioeconomic History  . Marital status: Married    Spouse name: Not on file  . Number of children: 0  . Years of education: Not on file  . Highest education level: 12th grade  Occupational History  . Occupation: Disabled  Tobacco Use  . Smoking status: Never Smoker  . Smokeless tobacco: Never Used  Vaping Use  . Vaping Use: Never used  Substance and Sexual Activity  . Alcohol use: No    Alcohol/week: 0.0 standard drinks  . Drug use: No  . Sexual activity: Not on file  Other Topics Concern  . Not on file  Social History  Narrative   ** Merged History Encounter **       ** Data from: 10/22/14 Enc Dept: [facility]       ** Data from: 10/07/14 Enc Dept: [facility]   Arrest: Arrested in 2012 for indecent liberties, required to wear ankle monitor   Social Determinants of Health   Financial Resource Strain: High Risk  . Difficulty of Paying Living Expenses: Hard  Food Insecurity: Food Insecurity Present  . Worried About Charity fundraiser in the Last Year: Sometimes true  . Ran Out of Food in the Last Year: Sometimes true  Transportation Needs: Not on file  Physical Activity: Not on file  Stress: Not on file  Social Connections: Not on file  Intimate Partner Violence: Not on file    Outpatient Medications Prior to Visit  Medication Sig  Dispense Refill  . acetaminophen (TYLENOL) 325 MG tablet Take 1-2 tablets (325-650 mg total) by mouth every 4 (four) hours as needed for mild pain.    [name] albuterol (PROAIR HFA) 108 (90 Base) MCG/ACT inhaler INHALE 2 PUFFS BY MOUTH EVERY 6 HOURS IF NEEDED FOR COUGH OR WHEEZING. 8.5 g 3  . aspirin 81 MG EC tablet Take 1 tablet (81 mg total) by mouth daily. 180 tablet 0  . atorvastatin (LIPITOR) 80 MG tablet Take 1 tablet (80 mg total) by mouth daily. 90 tablet 3  . Blood Glucose Monitoring Suppl (ONE TOUCH ULTRA 2) w/Device KIT Use to check blood sugar 4 times a day 1 kit 0  . BREO ELLIPTA 100-25 MCG/INH AEPB INHALE 1 PUFF BY MOUTH ONCE DAILY 60 each 1  . clobetasol cream (TEMOVATE) 0.34 % Apply 1 application topically 2 (two) times daily. Apply to affected area 30 g 0  . clopidogrel (PLAVIX) 75 MG tablet Take 1 tablet by mouth daily.    [name] Dextran 70-Hypromellose (ARTIFICIAL TEARS) 0.1-0.3 % SOLN Apply 1 drop to eye 3 (three) times daily. To right eye only. 30 mL 3  . diclofenac Sodium (VOLTAREN) 1 % GEL Apply 2 g topically 3 (three) times daily. 50 g 3  . empagliflozin (JARDIANCE) 25 MG TABS tablet Take 1 tablet (25 mg total) by mouth daily. 30 tablet 1  . fenofibrate 54 MG tablet Take 1 tablet (54 mg total) by mouth daily. 90 tablet 3  . gabapentin (NEURONTIN) 300 MG capsule TAKE 1 CAPSULE BY MOUTH TWICE DAILY 60 capsule 1  . glucose blood (ONETOUCH ULTRA) test strip To check blood sugar three times daily 300 each 3  . insulin degludec (TRESIBA FLEXTOUCH) 100 UNIT/ML FlexTouch Pen INJECT 22 UNITS SUBQ ONCE DAILY 9 mL 0  . Insulin Pen Needle (PEN NEEDLES) 32G X 4 MM MISC 1 kit by Does not apply route daily. 100 each 4  . Lancets (ONETOUCH ULTRASOFT) lancets Use as instructed 100 each 12  . lisinopril (ZESTRIL) 20 MG tablet Take 1 tablet (20 mg total) by mouth daily. 90 tablet 4  . montelukast (SINGULAIR) 10 MG tablet TAKE 1 TABLET BY MOUTH ONCE EVERY EVENING AT BEDTIME. 30 tablet 11  .  nitroGLYCERIN (NITROSTAT) 0.4 MG SL tablet Place 1 tablet (0.4 mg total) under the tongue every 5 (five) minutes as needed for chest pain. 50 tablet 3  . omeprazole (PRILOSEC) 20 MG capsule Take 1 capsule (20 mg total) by mouth daily. 30 capsule 12  . polyethylene glycol (MIRALAX) 17 g packet Take 17 g by mouth daily. 14 each 0  . tiZANidine (ZANAFLEX) 2 MG  tablet TAKE 1 TABLET BY MOUTH EVERY 6 HOURS AS NEEDED MUSCLE SPASMS 30 tablet 1  . trimethoprim-polymyxin b (POLYTRIM) ophthalmic solution Place 1 drop into the right eye every 6 (six) hours.     . Vitamin D, Ergocalciferol, (DRISDOL) 1.25 MG (50000 UNIT) CAPS capsule TAKE 1 CAPSULE BY MOUTH ONCE A WEEK 4 capsule 12   No facility-administered medications prior to visit.    Allergies  Allergen Reactions  . Bee Venom Hives    All kinds of bees  . Other     Certain powders  . Sulfa Antibiotics     ROS Review of Systems  Constitutional: Positive for fatigue.  Respiratory: Negative.   Cardiovascular: Positive for chest pain.       Had episode of chest pain 1 week ago, took nitro SL and it resolved, also was around the time of his mother's funeral  Gastrointestinal: Negative.   Musculoskeletal: Positive for arthralgias (knee pain, sees surgeon next week).  Skin:       Bruise to left shin, abrasion to left forearm  Neurological: Positive for dizziness (before fall) and weakness (leaning to left side for last 2 weeks).  Psychiatric/Behavioral: The patient is nervous/anxious (a couple of episodes last week, mom passed away).       Objective:    Physical Exam Vitals and nursing note reviewed.  Constitutional:      General: He is not in acute distress.    Appearance: Normal appearance.  HENT:     Head: Normocephalic.  Eyes:     Extraocular Movements: Extraocular movements intact.     Conjunctiva/sclera: Conjunctivae normal.  Neck:     Vascular: No carotid bruit.  Cardiovascular:     Rate and Rhythm: Normal rate and regular  rhythm.     Pulses: Normal pulses.     Heart sounds: Normal heart sounds.  Pulmonary:     Effort: Pulmonary effort is normal.     Breath sounds: Normal breath sounds.  Abdominal:     Palpations: Abdomen is soft.     Tenderness: There is no abdominal tenderness.  Musculoskeletal:        General: Swelling (left wrist) present.     Cervical back: Normal range of motion.     Comments: Decreased range of motion to left wrist and ROM with pain. Full range of motion to right wrist. Decreased left grip strength associated with pain. Bilateral lower extremities 5/5 strength.  Skin:    General: Skin is warm.     Comments: Scabbed abrasion to left forearm  Neurological:     General: No focal deficit present.     Mental Status: He is alert and oriented to person, place, and time.     Cranial Nerves: No cranial nerve deficit.  Psychiatric:        Mood and Affect: Mood normal.     Comments: Asks same question multiple times     BP 112/72   Pulse (!) 55   Temp 98.1 F (36.7 C)   Wt 178 lb (80.7 kg)   SpO2 98%   BMI 32.80 kg/m  Wt Readings from Last 3 Encounters:  05/23/20 178 lb (80.7 kg)  02/24/20 181 lb 3.2 oz (82.2 kg)  12/30/19 178 lb 6.4 oz (80.9 kg)   BP sitting: 104/58 BP standing: 104/58  Lab Results  Component Value Date   TSH 0.994 03/25/2019   Lab Results  Component Value Date   WBC 6.0 03/25/2019   HGB 16.2 03/25/2019  HCT 48.5 03/25/2019   MCV 93 03/25/2019   PLT 174 03/25/2019   Lab Results  Component Value Date   NA 136 02/24/2020   K 4.8 02/24/2020   CO2 23 02/24/2020   GLUCOSE 377 (H) 02/24/2020   BUN 20 02/24/2020   CREATININE 1.21 02/24/2020   BILITOT 0.7 12/30/2019   ALKPHOS 130 (H) 12/30/2019   AST 18 12/30/2019   ALT 25 12/30/2019   PROT 7.1 12/30/2019   ALBUMIN 4.6 12/30/2019   CALCIUM 9.1 02/24/2020   ANIONGAP 11 03/09/2019   Lab Results  Component Value Date   CHOL 150 12/30/2019   Lab Results  Component Value Date   HDL 40  12/30/2019   Lab Results  Component Value Date   LDLCALC 85 12/30/2019   Lab Results  Component Value Date   TRIG 144 12/30/2019   Lab Results  Component Value Date   CHOLHDL 5.3 03/03/2019   Lab Results  Component Value Date   HGBA1C 9.4 (H) 02/24/2020      Assessment & Plan:   Problem List Items Addressed This Visit      Other   Syncope - Primary    Had a syncopal episode on 05/22/20. Stated he got dizzy and doesn't remember falling, then came to when someone helped him up. Pain, swelling, and limited ROM to left wrist. Will check wrist x-ray. He can use ice and take ibuprofen or tylenol as needed for pain. Blood pressure noted to be 104/58 sitting and standing manually. Will decrease his lisinopril to 56m daily. He has a history of stroke which presented as dizziness and weakness. Neuro examination WNL, decreased strength to left wrist with injury from fall. Will check CT head to r/o stroke and also because he does not remember if he hit his head. EKG obtained which showed sinus bradycardia 58 bpm. Encouraged increasing fluids. Recommend he schedules a follow-up with cardiology (Dr. KHumphrey Rolls and neurology (Dr. PMelrose Nakayama. Check CMP and CBC. Keep follow-up appointment on Thursday 05/24/20. If he has another episode of dizziness/syncope, go to the ER.      Relevant Orders   EKG 12-Lead (Completed)   Comp Met (CMET)   CBC w/Diff   DG Wrist Complete Left   CT Head Wo Contrast    Other Visit Diagnoses    Fall, initial encounter       See plan for syncope   Relevant Orders   EKG 12-Lead (Completed)   DG Wrist Complete Left   Chest pain, unspecified type       EKG showed sinus bradycardia, HR 58bpm. Episode of chest pain last week. Sees cardiology regularly. Follow-up with Dr. KHumphrey Rolls       No orders of the defined types were placed in this encounter.   Follow-up: Return if symptoms worsen or fail to improve, for keep appointment with Jolene on Thursday 05/25/20.    LCharyl Dancer NP

## 2020-05-24 ENCOUNTER — Ambulatory Visit
Admission: RE | Admit: 2020-05-24 | Discharge: 2020-05-24 | Disposition: A | Discharge status: Home / Self Care | Payer: Medicare Other | Source: Ambulatory Visit | Attending: Nurse Practitioner | Admitting: Nurse Practitioner

## 2020-05-24 ENCOUNTER — Telehealth: Payer: Self-pay

## 2020-05-24 DIAGNOSIS — M25532 Pain in left wrist: Secondary | ICD-10-CM | POA: Diagnosis not present

## 2020-05-24 DIAGNOSIS — R55 Syncope and collapse: Secondary | ICD-10-CM | POA: Diagnosis not present

## 2020-05-24 DIAGNOSIS — W19XXXA Unspecified fall, initial encounter: Secondary | ICD-10-CM | POA: Diagnosis not present

## 2020-05-24 DIAGNOSIS — H6123 Impacted cerumen, bilateral: Secondary | ICD-10-CM | POA: Diagnosis not present

## 2020-05-24 DIAGNOSIS — S62102A Fracture of unspecified carpal bone, left wrist, initial encounter for closed fracture: Secondary | ICD-10-CM | POA: Insufficient documentation

## 2020-05-24 DIAGNOSIS — S0990XA Unspecified injury of head, initial encounter: Secondary | ICD-10-CM | POA: Diagnosis not present

## 2020-05-24 LAB — CBC WITH DIFFERENTIAL/PLATELET
Basophils Absolute: 0 10*3/uL (ref 0.0–0.2)
Basos: 1 %
EOS (ABSOLUTE): 0.2 10*3/uL (ref 0.0–0.4)
Eos: 3 %
Hematocrit: 42.6 % (ref 37.5–51.0)
Hemoglobin: 13.7 g/dL (ref 13.0–17.7)
Immature Grans (Abs): 0 10*3/uL (ref 0.0–0.1)
Immature Granulocytes: 0 %
Lymphocytes Absolute: 1.5 10*3/uL (ref 0.7–3.1)
Lymphs: 27 %
MCH: 28.5 pg (ref 26.6–33.0)
MCHC: 32.2 g/dL (ref 31.5–35.7)
MCV: 89 fL (ref 79–97)
Monocytes Absolute: 0.4 10*3/uL (ref 0.1–0.9)
Monocytes: 8 %
Neutrophils Absolute: 3.4 10*3/uL (ref 1.4–7.0)
Neutrophils: 61 %
Platelets: 196 10*3/uL (ref 150–450)
RBC: 4.8 x10E6/uL (ref 4.14–5.80)
RDW: 13.2 % (ref 11.6–15.4)
WBC: 5.6 10*3/uL (ref 3.4–10.8)

## 2020-05-24 LAB — COMPREHENSIVE METABOLIC PANEL
ALT: 24 IU/L (ref 0–44)
AST: 23 IU/L (ref 0–40)
Albumin/Globulin Ratio: 1.8 (ref 1.2–2.2)
Albumin: 4.6 g/dL (ref 3.8–4.9)
Alkaline Phosphatase: 147 IU/L — ABNORMAL HIGH (ref 44–121)
BUN/Creatinine Ratio: 15 (ref 9–20)
BUN: 19 mg/dL (ref 6–24)
Bilirubin Total: 0.5 mg/dL (ref 0.0–1.2)
CO2: 19 mmol/L — ABNORMAL LOW (ref 20–29)
Calcium: 9.4 mg/dL (ref 8.7–10.2)
Chloride: 104 mmol/L (ref 96–106)
Creatinine, Ser: 1.26 mg/dL (ref 0.76–1.27)
Globulin, Total: 2.5 g/dL (ref 1.5–4.5)
Glucose: 188 mg/dL — ABNORMAL HIGH (ref 65–99)
Potassium: 4.1 mmol/L (ref 3.5–5.2)
Sodium: 141 mmol/L (ref 134–144)
Total Protein: 7.1 g/dL (ref 6.0–8.5)
eGFR: 67 mL/min/{1.73_m2} (ref >59)

## 2020-05-24 NOTE — Progress Notes (Signed)
Please let [name] know that his wrist x-ray shows a fracture fragment and I am referring him to orthopedics. If they can't get in him in today or tomorrow, he can go to [facility] as a walk-in. Someone else should be calling him to set up this appointment. His blood work looks great, no concerns. I tried calling him myself at 11am on 05/24/20 and there was no answer and the mailbox was full, unable to leave a voicemail.

## 2020-05-24 NOTE — Telephone Encounter (Signed)
Pt's wife [name] called back. Called [facility] and spoke with Vicente Males and merged the call to get pt scheduled for wrist repair.

## 2020-05-24 NOTE — Telephone Encounter (Signed)
Wife calling asking me to call [facility] to provide them pt information. The text option is deactivated. Per  Caryl Pina at [employer] if pt gets texts message, can disregard. Called [name] and LM on VM with message.

## 2020-05-24 NOTE — Addendum Note (Signed)
Addended by: Vance Peper A on: 05/24/2020 10:55 AM   Modules accepted: Orders

## 2020-05-25 ENCOUNTER — Other Ambulatory Visit: Payer: Self-pay | Admitting: Nurse Practitioner

## 2020-05-25 ENCOUNTER — Telehealth: Payer: Self-pay

## 2020-05-25 ENCOUNTER — Ambulatory Visit: Payer: Medicare Other | Admitting: Nurse Practitioner

## 2020-05-25 DIAGNOSIS — S6392XA Sprain of unspecified part of left wrist and hand, initial encounter: Secondary | ICD-10-CM | POA: Diagnosis not present

## 2020-05-25 NOTE — Telephone Encounter (Signed)
-----  Message from [name], NP sent at 05/25/2020 11:30 AM EST ----- Does need to reschedule at this was his diabetes visit and needs this in upcoming weeks please

## 2020-05-25 NOTE — Telephone Encounter (Signed)
Unable to lvm to rescheduled  apt that was cancelled.

## 2020-06-01 ENCOUNTER — Telehealth: Payer: Self-pay

## 2020-06-01 ENCOUNTER — Ambulatory Visit (INDEPENDENT_AMBULATORY_CARE_PROVIDER_SITE_OTHER): Payer: Medicare Other | Admitting: Nurse Practitioner

## 2020-06-01 ENCOUNTER — Encounter: Payer: Self-pay | Admitting: Nurse Practitioner

## 2020-06-01 ENCOUNTER — Other Ambulatory Visit: Payer: Self-pay

## 2020-06-01 VITALS — BP 135/77 | HR 60 | Temp 98.2°F | Wt 181.0 lb

## 2020-06-01 DIAGNOSIS — E1169 Type 2 diabetes mellitus with other specified complication: Secondary | ICD-10-CM

## 2020-06-01 DIAGNOSIS — E559 Vitamin D deficiency, unspecified: Secondary | ICD-10-CM | POA: Diagnosis not present

## 2020-06-01 DIAGNOSIS — J4540 Moderate persistent asthma, uncomplicated: Secondary | ICD-10-CM

## 2020-06-01 DIAGNOSIS — I152 Hypertension secondary to endocrine disorders: Secondary | ICD-10-CM

## 2020-06-01 DIAGNOSIS — E1159 Type 2 diabetes mellitus with other circulatory complications: Secondary | ICD-10-CM | POA: Diagnosis not present

## 2020-06-01 DIAGNOSIS — Z1211 Encounter for screening for malignant neoplasm of colon: Secondary | ICD-10-CM

## 2020-06-01 DIAGNOSIS — E785 Hyperlipidemia, unspecified: Secondary | ICD-10-CM | POA: Diagnosis not present

## 2020-06-01 DIAGNOSIS — E6609 Other obesity due to excess calories: Secondary | ICD-10-CM

## 2020-06-01 DIAGNOSIS — E701 Other hyperphenylalaninemias: Secondary | ICD-10-CM

## 2020-06-01 DIAGNOSIS — E1165 Type 2 diabetes mellitus with hyperglycemia: Secondary | ICD-10-CM | POA: Diagnosis not present

## 2020-06-01 DIAGNOSIS — Z8673 Personal history of transient ischemic attack (TIA), and cerebral infarction without residual deficits: Secondary | ICD-10-CM

## 2020-06-01 DIAGNOSIS — E1129 Type 2 diabetes mellitus with other diabetic kidney complication: Secondary | ICD-10-CM

## 2020-06-01 DIAGNOSIS — I77811 Abdominal aortic ectasia: Secondary | ICD-10-CM

## 2020-06-01 DIAGNOSIS — R809 Proteinuria, unspecified: Secondary | ICD-10-CM

## 2020-06-01 DIAGNOSIS — I6523 Occlusion and stenosis of bilateral carotid arteries: Secondary | ICD-10-CM | POA: Diagnosis not present

## 2020-06-01 DIAGNOSIS — Z6833 Body mass index (BMI) 33.0-33.9, adult: Secondary | ICD-10-CM

## 2020-06-01 LAB — MICROALBUMIN, URINE WAIVED
Creatinine, Urine Waived: 50 mg/dL (ref 10–300)
Microalb, Ur Waived: 10 mg/L (ref 0–19)

## 2020-06-01 LAB — BAYER DCA HB A1C WAIVED: HB A1C (BAYER DCA - WAIVED): 13.1 % — ABNORMAL HIGH (ref <7.0)

## 2020-06-01 MED ORDER — PULSE OXIMETER FOR FINGER MISC: 1 refills | Status: DC

## 2020-06-01 MED ORDER — TIZANIDINE HCL 2 MG PO TABS: ORAL_TABLET | ORAL | 1 refills | Status: DC

## 2020-06-01 NOTE — Assessment & Plan Note (Addendum)
Chronic, ongoing.  Continue Lipitor and Fenofibrate. Lipid panel today.  Adjust doses as needed, if LDL >70 this check consider addition of Zetia or change to Rosuvastatin 40 MG.  Recent LDL 85 and Atorvastatin was increased to 80 MG.

## 2020-06-01 NOTE — Assessment & Plan Note (Signed)
Continue collaboration with neurology and continue statin + ASA/Plavix.  Discussed goals BP <130/90, A1C < 6.5%, and LDL <70.  He is poorly controlled with diabetes, much related to knowledge base and poor diet.  Referral to endocrinology.

## 2020-06-01 NOTE — Assessment & Plan Note (Signed)
Continue supplement and recheck level today.

## 2020-06-01 NOTE — Assessment & Plan Note (Signed)
Ongoing, is followed by cardiology and reports being scheduled for upcoming carotid ultrasound and echo in office.  Continue collaboration with cardiology and current statin and ASA.

## 2020-06-01 NOTE — Assessment & Plan Note (Signed)
BMI 33.35.  Recommended eating smaller high protein, low fat meals more frequently and exercising 30 mins a day 5 times a week with a goal of 10-15lb weight loss in the next 3 months. Patient voiced their understanding and motivation to adhere to these recommendations.

## 2020-06-01 NOTE — Assessment & Plan Note (Signed)
Chronic, stable with BP at goal in office.  Continue current medication regimen and collaboration with cardiology, adjust regimen as needed.  Obtain CMP today.  Recommend they check his BP at home at least daily and document + focus on DASH diet.  Return to office in 3 months for BP follow-up.

## 2020-06-01 NOTE — Progress Notes (Signed)
BP 135/77   Pulse 60   Temp 98.2 F (36.8 C) (Oral)   Wt 181 lb (82.1 kg)   SpO2 97%   BMI 33.35 kg/m    Subjective:    Patient ID: Alex Metro., male    DOB: Aug 02, 1960, 60 y.o.   MRN: [id number]  HPI: Alex Beske. is a 60 y.o. male  Chief Complaint  Patient presents with  . Hyperlipidemia  . Hypertension  . Diabetes   Wife is not with him today, at baseline patient poor historian and wife often monitors medications and health.  DIABETES Recent A1C in December 9.4% and continues [place].  He continues on Jardiance 25 MG along with [place].  His wife, [name], is in charge of medications at home and she is not present today.  In past Metformin caused kidney issues.  Has history of GI issues and pancreatitis -- has struggled with GLP1 in past per report.  Lost his mother on the 23rd and has been struggling with this.  Saw eye doctor weeks back and treated for eye infection, then will get new glasses.  Saw Dr. Juleen China with nephrology on 01/25/20 for proteinuria -- is to return on 08/01/20.  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 Has seen dietician.  He reports he has stopped drinking sodas. Has been drinking flavored water and tea.    Hypoglycemic episodes:no Polydipsia/polyuria: yes Visual disturbance: no Chest pain: no Paresthesias: no Glucose Monitoring: yes             Accucheck frequency: BID -- did not bring book with him today -- states a couple times the numbers have been 400             Fasting glucose: this morning was "4 something"             Post prandial:              Evening:             Before meals: Taking Insulin?: yes             Long acting insulin: Tresiba 42 units             Short acting insulin: Blood Pressure Monitoring: not checking Retinal Examination: Not up to Date Foot Exam: Up to Date Pneumovax: Up to Date Influenza: Up to  Date Aspirin: yes   ASTHMA Has Albuterol uses as needed.  Started Breo on 09/02/19 -- he reports "I like this one".  Never a smoker.  FEV1 70% in June 2021. 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 Sees Dr. Humphrey Rolls for cardiology and is scheduled upcoming for visit and echo + carotid dopplers due bruit per patient report.  Had initial visit with Dr. Melrose Nakayama on 11/09/19 -- he is to continue Plavix and ASA.  Has history of ectatic abdominal aorta found incidentally 10/26/2014 and follow-up imaging on 01/12/20  showed normal abdominal aorta -- no additional u/s needed.  Current medications Lisinopril, Atorvastatin, Fenofibrate,  Satisfied with current treatment?yes Duration of hypertension:chronic BP monitoring frequency:daily BP range:  BP medication side effects:no Duration of hyperlipidemia:chronic Cholesterol medication side effects:no Cholesterol supplements: none Medication compliance:good compliance Aspirin:yes Recent stressors:no Recurrent headaches:no Visual changes:no Palpitations:no Dyspnea:occasional Chest pain:no Lower extremity edema:no Dizzy/lightheaded:no  Relevant past medical, surgical, family and social history reviewed and updated as indicated. Interim medical history since our last visit reviewed. Allergies and medications reviewed and updated.  Review of Systems  Constitutional: Negative for activity change, diaphoresis,  fatigue and fever.  Respiratory: Positive for shortness of breath (occasional at baseline). Negative for cough, chest tightness and wheezing.   Cardiovascular: Negative for chest pain, palpitations and leg swelling.  Gastrointestinal: Negative.   Endocrine: Negative for cold intolerance, heat intolerance, polydipsia, polyphagia and polyuria.  Neurological: Negative.   Psychiatric/Behavioral: Negative.     Per HPI unless specifically indicated above     Objective:    BP 135/77   Pulse 60   Temp 98.2 F (36.8 C) (Oral)   Wt 181 lb (82.1 kg)   SpO2 97%   BMI 33.35 kg/m   Wt Readings from Last 3 Encounters:  06/01/20 181 lb (82.1 kg)  05/23/20 178 lb (80.7 kg)  02/24/20 181 lb 3.2 oz (82.2 kg)    Physical Exam Vitals and nursing note reviewed.  Constitutional:      General: He is awake. He is not in acute distress.    Appearance: He is well-developed. He is obese. He is not ill-appearing.  HENT:     Head: Normocephalic and atraumatic.     Right Ear: Hearing normal. No drainage.     Left Ear: Hearing normal. No drainage.  Eyes:     General: Lids are normal.        Right eye: No discharge.        Left eye: No discharge.     Conjunctiva/sclera: Conjunctivae normal.     Pupils: Pupils are equal, round, and reactive to light.  Neck:     Thyroid: No thyromegaly.     Vascular: Carotid bruit (R>L) present.  Cardiovascular:     Rate and Rhythm: Normal rate and regular rhythm.     Heart sounds: S1 normal and S2 normal. Murmur heard.   Systolic murmur is present with a grade of 2/6. No gallop.      Comments: Systolic murmur noted best at left sternal border, soft. Pulmonary:     Effort: Pulmonary effort is normal. No accessory muscle usage or respiratory distress.     Breath sounds: Normal breath sounds.  Abdominal:     General: Bowel sounds are normal.     Palpations: Abdomen is soft.  Musculoskeletal:        General: Normal range of motion.     Cervical back: Normal  range of motion and neck supple.     Right lower leg: No edema.     Left lower leg: No edema.  Skin:    General: Skin is warm and dry.     Capillary Refill: Capillary refill takes less than 2 seconds.  Neurological:     Mental Status: He is alert and oriented to person, place, and time.     Deep Tendon Reflexes: Reflexes are normal and symmetric.  Psychiatric:        Attention and Perception: Attention normal.        Mood and Affect: Mood normal.  Speech: Speech normal.        Behavior: Behavior normal. Behavior is cooperative.     Results for orders placed or performed in visit on 06/01/20  Bayer DCA Hb A1c Waived  Result Value Ref Range   HB A1C (BAYER DCA - WAIVED) 13.1 (H) <7.0 %  Microalbumin, Urine Waived  Result Value Ref Range   Microalb, Ur Waived 10 0 - 19 mg/L   Creatinine, Urine Waived 50 10 - 300 mg/dL   Microalb/Creat Ratio 30-300 (H) <30 mg/g      Assessment & Plan:   Problem List Items Addressed This Visit      Cardiovascular and Mediastinum   Hypertension associated with diabetes ([place])    Chronic, stable with BP at goal in office.  Continue current medication regimen and collaboration with cardiology, adjust regimen as needed.  Obtain CMP today.  Recommend they check his BP at home at least daily and document + focus on DASH diet.  Return to office in 3 months for BP follow-up.      Relevant Orders   Bayer DCA Hb A1c Waived (Completed)   TSH   Comprehensive metabolic panel   Ectatic abdominal aorta (HCC)    Noted initially incidentally on imaging 10/26/2014, repeat imaging 01/12/20 showed normal abdominal aorta with recommendation for no further u/s needed.      Atherosclerosis of both carotid arteries    Ongoing, is followed by cardiology and reports being scheduled for upcoming carotid ultrasound and echo in office.  Continue collaboration with cardiology and current statin and ASA.        Respiratory   Asthma    Chronic, ongoing.  FEV1 70%  at check in 2021.  Tolerating Breo, to continue to use daily.  Recommend using Albuterol as needed if episodes of SOB present.  Will order pulse ox for patient to use at home as needed per request.  Return in 3 months.        Endocrine   Hyperlipidemia associated with type 2 diabetes mellitus (HCC)    Chronic, ongoing.  Continue Lipitor and Fenofibrate. Lipid panel today.  Adjust doses as needed, if LDL >70 this check consider addition of Zetia or change to Rosuvastatin 40 MG.  Recent LDL 85 and Atorvastatin was increased to 80 MG.      Relevant Orders   Bayer DCA Hb A1c Waived (Completed)   Lipid Panel w/o Chol/HDL Ratio   Uncontrolled type 2 diabetes mellitus with hyperglycemia (HCC) - Primary    Chronic, ongoing.  A1C upward trend today to 13.1%.  Has been making poor diet choices. Referral to endocrinology placed and discussed with patient.  Continue Jardiance and increase Tresiba to 50 units QHS, if fasting BS remain >130 in 3 days then increase to 52 units -- wrote instructions for his wife to review in his book.  Poor tolerance to Metformin in past and allergic to Sulfa.  Educated patient at length on effect of diabetes from head to toe and increased risk for recurrent CVA due to poor control.  Recommend they check his BS TID -- he would benefit from [facility] will work on this with CCM team.  Would avoid GLP at this time due to patient history of pancreatitis, concern this would flare.  Will most likely need meal time insulin, although concern as unsure would consistently due -- his wife handles his medications.  Goal A1C <6.5%.  Continue collaboration with CCM team.  Return in 3 months.  Relevant Orders   Bayer DCA Hb A1c Waived (Completed)   Microalbumin, Urine Waived (Completed)   Comprehensive metabolic panel   Ambulatory referral to Endocrinology   Type 2 diabetes mellitus with proteinuria (HCC)    Chronic, ongoing.  A1C upward trend today to 13.1% and urine ALB 10 --  continue collaboration with nephrology.  Has been making poor diet choices. Referral to endocrinology placed and discussed with patient.  Continue Jardiance and increase Tresiba to 50 units QHS, if fasting BS remain >130 in 3 days then increase to 52 units -- wrote instructions for his wife to review in his book.  Poor tolerance to Metformin in past and allergic to Sulfa.  Educated patient at length on effect of diabetes from head to toe and increased risk for recurrent CVA due to poor control.  Recommend they check his BS TID -- he would benefit from [facility] will work on this with CCM team.  Would avoid GLP at this time due to patient history of pancreatitis, concern this would flare.  Will most likely need meal time insulin, although concern as unsure would consistently due -- his wife handles his medications.  Goal A1C <6.5%.  Continue collaboration with CCM team.  Return in 3 months.      Relevant Orders   Bayer DCA Hb A1c Waived (Completed)   Microalbumin, Urine Waived (Completed)   Ambulatory referral to Endocrinology     Other   Phenylketonuria (PKU) [facility])    Mental delays present, continue to work with CCM team in assisting with education and reiterating diet needs.      Vitamin D deficiency    Continue supplement and recheck level today.      Relevant Orders   VITAMIN D 25 Hydroxy (Vit-D Deficiency, Fractures)   Obesity    BMI 33.35.  Recommended eating smaller high protein, low fat meals more frequently and exercising 30 mins a day 5 times a week with a goal of 10-15lb weight loss in the next 3 months. Patient voiced their understanding and motivation to adhere to these recommendations.       History of CVA (cerebrovascular accident)    Continue collaboration with neurology and continue statin + ASA/Plavix.  Discussed goals BP <130/90, A1C < 6.5%, and LDL <70.  He is poorly controlled with diabetes, much related to knowledge base and poor diet.  Referral to endocrinology.        Other Visit Diagnoses    Screen for colon cancer       Cologuard ordered.   Relevant Orders   Cologuard      Time: 25 minutes, >50% spent counseling/or care coordination/education   Follow up plan: Return in about 3 months (around 09/01/2020) for T2DM, HTN/HLD.

## 2020-06-01 NOTE — Assessment & Plan Note (Signed)
Mental delays present, continue to work with CCM team in [place] with education and reiterating diet needs.

## 2020-06-01 NOTE — Patient Instructions (Signed)
Diabetes Mellitus and Nutrition, Adult When you have diabetes, or diabetes mellitus, it is very important to have healthy eating habits because your blood sugar (glucose) levels are greatly affected by what you eat and drink. Eating healthy foods in the right amounts, at about the same times every day, can help you:  Control your blood glucose.  Lower your risk of heart disease.  Improve your blood pressure.  Reach or maintain a healthy weight. What can affect my meal plan? Every person with diabetes is different, and each person has different needs for a meal plan. Your health care provider may recommend that you work with a dietitian to make a meal plan that is best for you. Your meal plan may vary depending on factors such as:  The calories you need.  The medicines you take.  Your weight.  Your blood glucose, blood pressure, and cholesterol levels.  Your activity level.  Other health conditions you have, such as heart or kidney disease. How do carbohydrates affect me? Carbohydrates, also called carbs, affect your blood glucose level more than any other type of food. Eating carbs naturally raises the amount of glucose in your blood. Carb counting is a method for keeping track of how many carbs you eat. Counting carbs is important to keep your blood glucose at a healthy level, especially if you use insulin or take certain oral diabetes medicines. It is important to know how many carbs you can safely have in each meal. This is different for every person. Your dietitian can help you calculate how many carbs you should have at each meal and for each snack. How does alcohol affect me? Alcohol can cause a sudden decrease in blood glucose (hypoglycemia), especially if you use insulin or take certain oral diabetes medicines. Hypoglycemia can be a life-threatening condition. Symptoms of hypoglycemia, such as sleepiness, dizziness, and confusion, are similar to symptoms of having too much  alcohol.  Do not drink alcohol if: ? Your health care provider tells you not to drink. ? You are pregnant, may be pregnant, or are planning to become pregnant.  If you drink alcohol: ? Do not drink on an empty stomach. ? Limit how much you use to:  0-1 drink a day for women.  0-2 drinks a day for men. ? Be aware of how much alcohol is in your drink. In the [place] one drink equals one 12 oz bottle of beer (355 mL), one 5 oz glass of wine (148 mL), or one 1 oz glass of hard liquor (44 mL). ? Keep yourself hydrated with water, diet soda, or unsweetened iced tea.  Keep in mind that regular soda, juice, and other mixers may contain a lot of sugar and must be counted as carbs. What are tips for following this plan? Reading food labels  Start by checking the serving size on the "Nutrition Facts" label of packaged foods and drinks. The amount of calories, carbs, fats, and other nutrients listed on the label is based on one serving of the item. Many items contain more than one serving per package.  Check the total grams (g) of carbs in one serving. You can calculate the number of servings of carbs in one serving by dividing the total carbs by 15. For example, if a food has 30 g of total carbs per serving, it would be equal to 2 servings of carbs.  Check the number of grams (g) of saturated fats and trans fats in one serving. Choose foods that have  a low amount or none of these fats.  Check the number of milligrams (mg) of salt (sodium) in one serving. Most people should limit total sodium intake to less than 2,300 mg per day.  Always check the nutrition information of foods labeled as "low-fat" or "nonfat." These foods may be higher in added sugar or refined carbs and should be avoided.  Talk to your dietitian to identify your daily goals for nutrients listed on the label. Shopping  Avoid buying canned, pre-made, or processed foods. These foods tend to be high in fat, sodium, and added  sugar.  Shop around the outside edge of the grocery store. This is where you will most often find fresh fruits and vegetables, bulk grains, fresh meats, and fresh dairy. Cooking  Use low-heat cooking methods, such as baking, instead of high-heat cooking methods like deep frying.  Cook using healthy oils, such as olive, canola, or sunflower oil.  Avoid cooking with butter, cream, or high-fat meats. Meal planning  Eat meals and snacks regularly, preferably at the same times every day. Avoid going long periods of time without eating.  Eat foods that are high in fiber, such as fresh fruits, vegetables, beans, and whole grains. Talk with your dietitian about how many servings of carbs you can eat at each meal.  Eat 4-6 oz (112-168 g) of lean protein each day, such as lean meat, chicken, fish, eggs, or tofu. One ounce (oz) of lean protein is equal to: ? 1 oz (28 g) of meat, chicken, or fish. ? 1 egg. ?  cup (62 g) of tofu.  Eat some foods each day that contain healthy fats, such as avocado, nuts, seeds, and fish.   What foods should I eat? Fruits Berries. Apples. Oranges. Peaches. Apricots. Plums. Grapes. Mango. Papaya. Pomegranate. Kiwi. Cherries. Vegetables Lettuce. Spinach. Leafy greens, including kale, chard, collard greens, and mustard greens. Beets. Cauliflower. Cabbage. Broccoli. Carrots. Green beans. Tomatoes. Peppers. Onions. Cucumbers. Brussels sprouts. Grains Whole grains, such as whole-wheat or whole-grain bread, crackers, tortillas, cereal, and pasta. Unsweetened oatmeal. Quinoa. Brown or wild rice. Meats and other proteins Seafood. Poultry without skin. Lean cuts of poultry and beef. Tofu. Nuts. Seeds. Dairy Low-fat or fat-free dairy products such as milk, yogurt, and cheese. The items listed above may not be a complete list of foods and beverages you can eat. Contact a dietitian for more information. What foods should I avoid? Fruits Fruits canned with  syrup. Vegetables Canned vegetables. Frozen vegetables with butter or cream sauce. Grains Refined white flour and flour products such as bread, pasta, snack foods, and cereals. Avoid all processed foods. Meats and other proteins Fatty cuts of meat. Poultry with skin. Breaded or fried meats. Processed meat. Avoid saturated fats. Dairy Full-fat yogurt, cheese, or milk. Beverages Sweetened drinks, such as soda or iced tea. The items listed above may not be a complete list of foods and beverages you should avoid. Contact a dietitian for more information. Questions to ask a health care provider  Do I need to meet with a diabetes educator?  Do I need to meet with a dietitian?  What number can I call if I have questions?  When are the best times to check my blood glucose? Where to find more information:  American Diabetes Association: diabetes.org  Academy of Nutrition and Dietetics: www.eatright.[employer] of Diabetes and Digestive and Kidney Diseases: [employer]  Association of Diabetes Care and Education Specialists: www.diabeteseducator.org Summary  It is important to have healthy eating  habits because your blood sugar (glucose) levels are greatly affected by what you eat and drink.  A healthy meal plan will help you control your blood glucose and maintain a healthy lifestyle.  Your health care provider may recommend that you work with a dietitian to make a meal plan that is best for you.  Keep in mind that carbohydrates (carbs) and alcohol have immediate effects on your blood glucose levels. It is important to count carbs and to use alcohol carefully. This information is not intended to replace advice given to you by your health care provider. Make sure you discuss any questions you have with your health care provider. Document Revised: 02/10/2019 Document Reviewed: 02/10/2019 Elsevier Patient Education  2021 [employer].

## 2020-06-01 NOTE — Assessment & Plan Note (Signed)
Noted initially incidentally on imaging 10/26/2014, repeat imaging 01/12/20 showed normal abdominal aorta with recommendation for no further u/s needed.

## 2020-06-01 NOTE — Assessment & Plan Note (Signed)
Chronic, ongoing.  FEV1 70% at check in 2021.  Tolerating Breo, to continue to use daily.  Recommend using Albuterol as needed if episodes of SOB present.  Will order pulse ox for patient to use at home as needed per request.  Return in 3 months.

## 2020-06-01 NOTE — Assessment & Plan Note (Signed)
Chronic, ongoing.  A1C upward trend today to 13.1%.  Has been making poor diet choices. Referral to endocrinology placed and discussed with patient.  Continue Jardiance and increase Tresiba to 50 units QHS, if fasting BS remain >130 in 3 days then increase to 52 units -- wrote instructions for his wife to review in his book.  Poor tolerance to Metformin in past and allergic to Sulfa.  Educated patient at length on effect of diabetes from head to toe and increased risk for recurrent CVA due to poor control.  Recommend they check his BS TID -- he would benefit from [facility] will work on this with CCM team.  Would avoid GLP at this time due to patient history of pancreatitis, concern this would flare.  Will most likely need meal time insulin, although concern as unsure would consistently due -- his wife handles his medications.  Goal A1C <6.5%.  Continue collaboration with CCM team.  Return in 3 months.

## 2020-06-01 NOTE — Assessment & Plan Note (Signed)
Chronic, ongoing.  A1C upward trend today to 13.1% and urine ALB 10 -- continue collaboration with nephrology.  Has been making poor diet choices. Referral to endocrinology placed and discussed with patient.  Continue Jardiance and increase Tresiba to 50 units QHS, if fasting BS remain >130 in 3 days then increase to 52 units -- wrote instructions for his wife to review in his book.  Poor tolerance to Metformin in past and allergic to Sulfa.  Educated patient at length on effect of diabetes from head to toe and increased risk for recurrent CVA due to poor control.  Recommend they check his BS TID -- he would benefit from [facility] will work on this with CCM team.  Would avoid GLP at this time due to patient history of pancreatitis, concern this would flare.  Will most likely need meal time insulin, although concern as unsure would consistently due -- his wife handles his medications.  Goal A1C <6.5%.  Continue collaboration with CCM team.  Return in 3 months.

## 2020-06-02 ENCOUNTER — Telehealth: Payer: Self-pay

## 2020-06-02 ENCOUNTER — Ambulatory Visit: Payer: Medicare Other | Admitting: Dermatology

## 2020-06-02 ENCOUNTER — Other Ambulatory Visit: Payer: Self-pay | Admitting: Nurse Practitioner

## 2020-06-02 DIAGNOSIS — E559 Vitamin D deficiency, unspecified: Secondary | ICD-10-CM

## 2020-06-02 DIAGNOSIS — E1169 Type 2 diabetes mellitus with other specified complication: Secondary | ICD-10-CM

## 2020-06-02 LAB — COMPREHENSIVE METABOLIC PANEL
ALT: 29 IU/L (ref 0–44)
AST: 24 IU/L (ref 0–40)
Albumin/Globulin Ratio: 2 (ref 1.2–2.2)
Albumin: 4.5 g/dL (ref 3.8–4.9)
Alkaline Phosphatase: 187 IU/L — ABNORMAL HIGH (ref 44–121)
BUN/Creatinine Ratio: 12 (ref 9–20)
BUN: 12 mg/dL (ref 6–24)
Bilirubin Total: 0.3 mg/dL (ref 0.0–1.2)
CO2: 19 mmol/L — ABNORMAL LOW (ref 20–29)
Calcium: 9.4 mg/dL (ref 8.7–10.2)
Chloride: 97 mmol/L (ref 96–106)
Creatinine, Ser: 1.03 mg/dL (ref 0.76–1.27)
Globulin, Total: 2.3 g/dL (ref 1.5–4.5)
Glucose: 450 mg/dL — ABNORMAL HIGH (ref 65–99)
Potassium: 4.3 mmol/L (ref 3.5–5.2)
Sodium: 135 mmol/L (ref 134–144)
Total Protein: 6.8 g/dL (ref 6.0–8.5)
eGFR: 85 mL/min/{1.73_m2} (ref >59)

## 2020-06-02 LAB — LIPID PANEL W/O CHOL/HDL RATIO
Cholesterol, Total: 207 mg/dL — ABNORMAL HIGH (ref 100–199)
HDL: 27 mg/dL — ABNORMAL LOW (ref >39)
LDL Chol Calc (NIH): 72 mg/dL (ref 0–99)
Triglycerides: 694 mg/dL (ref 0–149)
VLDL Cholesterol Cal: 108 mg/dL — ABNORMAL HIGH (ref 5–40)

## 2020-06-02 LAB — TSH: TSH: 0.881 u[IU]/mL (ref 0.450–4.500)

## 2020-06-02 LAB — VITAMIN D 25 HYDROXY (VIT D DEFICIENCY, FRACTURES): Vit D, 25-Hydroxy: 16.4 ng/mL — ABNORMAL LOW (ref 30.0–100.0)

## 2020-06-02 MED ORDER — CHOLECALCIFEROL 1.25 MG (50000 UT) PO TABS: 1.0000 | ORAL_TABLET | ORAL | 3 refills | Status: DC

## 2020-06-02 NOTE — Telephone Encounter (Signed)
Routing to provider to advise on patient concerns.

## 2020-06-02 NOTE — Telephone Encounter (Signed)
Pam or Jerene Pitch do you think Meals on Wheels could help with this?  As for labs, [place] I do want him fasting and to take all medications.

## 2020-06-02 NOTE — Telephone Encounter (Signed)
See other phone encounter.

## 2020-06-02 NOTE — Telephone Encounter (Signed)
Pt would like to know if he needs to take his medication before doing his labs would like to speak with some one in clinical as he is unsure if he needs to be fasting and taking medication before. Pts wife also has questions about meals on wheels that can help him control his diabetes and PKU as pt has issues trying to eat healthy.

## 2020-06-02 NOTE — Telephone Encounter (Signed)
-----  Message from Addison Naegeli, RN sent at 06/02/2020  1:28 PM EDT ----- Pt's wife [name] given results and recommendations per Jolene Cannady,"Please speak to [name], let her know Ragan's labs have returned -- firstly let her know I would like Toy to take 50 units of the [place] and to check his sugar fasting in morning and then 2 hours after each meal.  I need these to help determine steps as his A1c was 13.1% and I think we may need to add meal insulin.  I placed a referral to a diabetes doctor, as I think we need to get him in to one and see if we can get him a Freestyle monitor where he can monitor sugars better.  As for his labs his thyroid is normal as is kidney and liver function.  Vitamin D was a little low, I am going to send some Vitamin D3 in for him to start taking weekly.  His cholesterol levels were much more elevated then last check, especially triglycerides which were 694.  I need him to ensure he is taking his Atorvastatin and his Fenofibrate daily as these help lower the levels.  I would like to recheck via outpatient labs in 4 weeks and if ongoing elevation may change medications"; The pt's wife says the pt has been to a diabetic dietician; she says his problem is he is hard headed; he likes to take his blood sugar after he eats but she waits 2-3 hours to check it after he eats to check his blood sugar; she says the pt has been taking his cholesterol; she also says the pt is already prescribed that dose of Vitamin D; she would like to be called back to further discuss; his wive can be contacted at [phone]; will re-route to office per request.

## 2020-06-02 NOTE — Progress Notes (Signed)
Please speak to [name], let her know Jader's labs have returned -- firstly let her know I would like Donnavan to take 50 units of the [place] and to check his sugar fasting in morning and then 2 hours after each meal.  I need these to help determine steps as his A1c was 13.1% and I think we may need to add meal insulin.  I placed a referral to a diabetes doctor, as I think we need to get him in to one and see if we can get him a Freestyle monitor where he can monitor sugars better.  As for his labs his thyroid is normal as is kidney and liver function.  Vitamin D was a little low, I am going to send some Vitamin D3 in for him to start taking weekly.  His cholesterol levels were much more elevated then last check, especially triglycerides which were 694.  I need him to ensure he is taking his Atorvastatin and his Fenofibrate daily as these help lower the levels.  I would like to recheck via outpatient labs in 4 weeks and if ongoing elevation may change medications.  Any questions? Keep being awesome!!  Thank you for allowing me to participate in your care. Kindest regards, Fontaine Kossman

## 2020-06-02 NOTE — Telephone Encounter (Signed)
Copied from [place] [phone]. Topic: General - Other >> Jun 02, 2020  1:42 PM Oneta Rack wrote: Inquiring if there is any healthy meals on wheels program to assist.   Would pt need apt for this?

## 2020-06-03 ENCOUNTER — Ambulatory Visit: Payer: Self-pay | Admitting: Licensed Clinical Social Worker

## 2020-06-03 DIAGNOSIS — Z5941 Food insecurity: Secondary | ICD-10-CM

## 2020-06-03 DIAGNOSIS — E785 Hyperlipidemia, unspecified: Secondary | ICD-10-CM

## 2020-06-03 DIAGNOSIS — E1165 Type 2 diabetes mellitus with hyperglycemia: Secondary | ICD-10-CM

## 2020-06-03 DIAGNOSIS — I152 Hypertension secondary to endocrine disorders: Secondary | ICD-10-CM

## 2020-06-03 DIAGNOSIS — E1169 Type 2 diabetes mellitus with other specified complication: Secondary | ICD-10-CM

## 2020-06-03 DIAGNOSIS — R809 Proteinuria, unspecified: Secondary | ICD-10-CM

## 2020-06-03 DIAGNOSIS — E1129 Type 2 diabetes mellitus with other diabetic kidney complication: Secondary | ICD-10-CM

## 2020-06-03 DIAGNOSIS — E1159 Type 2 diabetes mellitus with other circulatory complications: Secondary | ICD-10-CM

## 2020-06-03 NOTE — Telephone Encounter (Signed)
Awesome!!  You are all awesome!!  Thank you.

## 2020-06-03 NOTE — Chronic Care Management (AMB) (Addendum)
Chronic Care Management    Clinical Social Work Note  06/03/2020 Name: [name]. MRN: [id number] DOB: [birth year]  [name]. is a 60 y.o. year old male who is a primary care patient of Cannady, Barbaraann Faster, NP. The CCM team was consulted to assist the patient with chronic disease management and/or care coordination needs related to: Food Insecurity.   Assessment: Review of patient past medical history, allergies, medications, and health status, including review of relevant consultants reports was performed today as part of a comprehensive evaluation and provision of chronic care management and care coordination services.    CCM Care Plan  Allergies  Allergen Reactions  . Bee Venom Hives    All kinds of bees  . Other     Certain powders  . Sulfa Antibiotics     Outpatient Encounter Medications as of 06/03/2020  Medication Sig  . acetaminophen (TYLENOL) 325 MG tablet Take 1-2 tablets (325-650 mg total) by mouth every 4 (four) hours as needed for mild pain.  [name] albuterol (PROAIR HFA) 108 (90 Base) MCG/ACT inhaler INHALE 2 PUFFS BY MOUTH EVERY 6 HOURS IF NEEDED FOR COUGH OR WHEEZING.  [name] aspirin 81 MG EC tablet Take 1 tablet (81 mg total) by mouth daily.  [name] atorvastatin (LIPITOR) 80 MG tablet Take 1 tablet (80 mg total) by mouth daily.  . Blood Glucose Monitoring Suppl (ONE TOUCH ULTRA 2) w/Device KIT Use to check blood sugar 4 times a day  . BREO ELLIPTA 100-25 MCG/INH AEPB INHALE 1 PUFF BY MOUTH ONCE DAILY  . Cholecalciferol 1.25 MG (50000 UT) TABS Take 1 tablet by mouth once a week. For 8 weeks and then stop.  Return to office for lab draw.  . clobetasol cream (TEMOVATE) 9.24 % Apply 1 application topically 2 (two) times daily. Apply to affected area  . clopidogrel (PLAVIX) 75 MG tablet Take 1 tablet by mouth daily.  [name] Dextran 70-Hypromellose (ARTIFICIAL TEARS) 0.1-0.3 % SOLN Apply 1 drop to eye 3 (three) times daily. To right eye only.  . diclofenac Sodium  (VOLTAREN) 1 % GEL Apply 2 g topically 3 (three) times daily.  . empagliflozin (JARDIANCE) 25 MG TABS tablet Take 1 tablet (25 mg total) by mouth daily.  . fenofibrate 54 MG tablet Take 1 tablet (54 mg total) by mouth daily.  [name] gabapentin (NEURONTIN) 300 MG capsule TAKE 1 CAPSULE BY MOUTH TWICE DAILY  . glucose blood (ONETOUCH ULTRA) test strip To check blood sugar three times daily  . Insulin Pen Needle (PEN NEEDLES) 32G X 4 MM MISC 1 kit by Does not apply route daily.  . Lancets (ONETOUCH ULTRASOFT) lancets Use as instructed  . lisinopril (ZESTRIL) 20 MG tablet Take 1 tablet (20 mg total) by mouth daily.  . Misc. Devices (PULSE OXIMETER FOR FINGER) MISC To check O2 saturations once daily with asthma and document + check if any shortness of breath  . montelukast (SINGULAIR) 10 MG tablet TAKE 1 TABLET BY MOUTH ONCE EVERY EVENING AT BEDTIME.  . nitroGLYCERIN (NITROSTAT) 0.4 MG SL tablet Place 1 tablet (0.4 mg total) under the tongue every 5 (five) minutes as needed for chest pain.  [name] omeprazole (PRILOSEC) 20 MG capsule Take 1 capsule (20 mg total) by mouth daily.  . polyethylene glycol (MIRALAX) 17 g packet Take 17 g by mouth daily.  [name] tiZANidine (ZANAFLEX) 2 MG tablet TAKE 1 TABLET BY MOUTH EVERY 6 HOURS AS NEEDED MUSCLE SPASMS  . TRESIBA FLEXTOUCH 100 UNIT/ML FlexTouch Pen  INJECT 22 UNITS SUBQ ONCE DAILY  . trimethoprim-polymyxin b (POLYTRIM) ophthalmic solution Place 1 drop into the right eye every 6 (six) hours.   [name] ULTRACARE PEN NEEDLES 32G X 5 MM MISC   . Vitamin D, Ergocalciferol, (DRISDOL) 1.25 MG (50000 UNIT) CAPS capsule TAKE 1 CAPSULE BY MOUTH ONCE A WEEK   No facility-administered encounter medications on file as of 06/03/2020.    Patient Active Problem List   Diagnosis Date Noted  . Type 2 diabetes mellitus with proteinuria ([place]) 03/25/2019  . Atherosclerosis of both carotid arteries 03/22/2019  . History of CVA (cerebrovascular accident) 03/06/2019  . Slow transit constipation    . Diplopia   . Uncontrolled type 2 diabetes mellitus with hyperglycemia ([place])   . Obesity 09/03/2018  . Skin tags, multiple acquired 08/23/2016  . Frequent falls 05/15/2016  . Vitamin D deficiency 08/22/2015  . Carpal tunnel syndrome on left 05/20/2015  . External hemorrhoids 12/20/2014  . Allergic rhinitis 10/21/2014  . Diverticulosis 10/21/2014  . Hypertension associated with diabetes ([place]) 10/21/2014  . Hyperlipidemia associated with type 2 diabetes mellitus ([place]) 10/21/2014  . Asthma 10/21/2014  . GERD (gastroesophageal reflux disease) 10/21/2014  . NAFLD (nonalcoholic fatty liver disease) 10/21/2014  . Arthritis 10/21/2014  . Chest pain with high risk for cardiac etiology 10/21/2014  . Pancreatitis 10/07/2014  . Phenylketonuria (PKU) ([place]) 10/07/2014  . Renal cyst, left 10/07/2014  . Gall bladder polyp 10/01/2013    Goals Addressed    .  SW-"I want to improve my health and self-care" (pt-stated)        Timeframe:  Long-Range Goal Priority:  Medium  Start Date:  05/16/20                         Expected End Date:  08/13/20                    Follow Up Date- 07/28/20  Current Barriers:  . Financial constraints . Limited social support . Level of care concerns . ADL IADL limitations . Literacy concerns . Cognitive Deficits . Lacks knowledge of community resource: available financial support resources within the area as well as socialization opportunities within the area . Grief support related to the recent loss of his mother  Clinical Social Work Clinical Goal(s):  [name] Over the next 120 days, client will work with SW to address concerns related to improving self-care and implementing it whenever able  Interventions: . Patient interviewed and appropriate assessments performed . Patient was informed that current CCM LCSW will be leaving position next month and his next CCM Social Work follow up visit will be with another LCSW. Patient was appreciative of support provided  and receptive to news . Patient's mother passed on 19-May-2020 and is experiencing grief symptoms. Patient has had two panic attacks since her passing and contacted front desk about medication to gain relief. PCP informed family that they would need an office visit before prescribing a new medication. Office visit was set for 05/25/20. Patient reports that he is doing better now and but doesn't think his new medication gabapentin is working for pain relief. This medication was started on 05/25/20. Patient is interested in going to a pain clinic and will discuss this with PCP during next visit.  [name] Positive reinforcement provided for working effectively on managing his blood sugars. Patient is very proud of this accomplishment.  [name] CCM LCSW educated patient on grief counseling through [facility] but  patient declined needing this referral at this time. Patient reports that his niece works for a [employer] firm and is assisting family with managing his mother's assets.  . He admits that he isn't sleeping well at night. LCSW provided education on healthy sleep hygiene and what that looks like. LCSW encouraged patient to implement a night time routine into his schedule that works best for him and that he is able to maintain. Advised patient to implement deep breathing/grounding/meditation/self-care exercises into his nightly routine to combat racing thoughts at night. LCSW encouraged patient to wake up at the same time each day, make her sleeping environment comfortable, exercise when able, to limit naps and to not eat or drink anything right before bed.  . Patient reports having a cyst on his knee that may need removing. Patient states that this cyst is causing him pain and irritation. Family reported that patient will be scheduling a nearby procedure for his hernia as well.  . Provided mental health counseling with regard to managing everyday stressors and implementing appropriate self-care techniques in order to improve  overall health. LCSW provided reflective listening and implemented appropriate interventions to help suppport patient and his emotional needs. Patient admitted anxiety today due to high blood sugar levels. However, patient checked his blood sugar level before eating anything today. LCSW provided education on healthy nutrition that will improve his self-care and anxiety management.  . Provided patient with information about OTC benefits that he is uses per quarter. Patient's spouse reports that she has started to order diabetic lotion for patient and this is helping him immensely. . Patient reports trying to implement appropriate self-care by exercise and mowing his yard. Patient reports ongoing chronic pain with his knees and side.  . Discussed plans with patient for ongoing care management follow up and provided patient with direct contact information for care management team . Advised patient to contact CCM embedded practice providers and PCP if any urgent concerns arise. [name] LCSW completed referral for a wheelchair ramp in the past and ramp installation process has been completed.  . Patient reports ongoing chronic pain and difficulty managing this without becoming irritated or ill. Patient was alert, oriented. Patient denies anxiety at this time. Discussed coping skills. SW used active and reflective listening, validated patient's feelings/concerns, and provided emotional support. . Assisted patient/caregiver with obtaining information about health plan benefits . Provided education to patient/caregiver regarding level of care options. . Patient and spouse put up decorations this week in remembrance of their lost family members that passed recently. Positive reinforcement provided to family for using their creativity within their home.  . Brief self-care education provided to both patient and spouse during outreach today. Positive reinforcement provided for positive self-care implementation.  . Family  report stable transportation to all upcoming medical appointments.  . Per spouse, patient has worked hard at changing his eating/drinking habits. Patient is no longer drinking soda and drinks a water all day long (with 0 calorie flavor sweeteners that they buy).  . Patient has experienced a few days of high blood sugars (404 today) especially when he has a low appetite and has trouble drinking/eating appropriately. . Patient's dad is currently working on transferring car title from his name to [facility] which family is happy about.  [name] CCM LCSW placed a C3 referral on 06/03/20 per PCP request for food support.   Patient Self Care Activities:  . Attends all scheduled provider appointments . Calls provider office for new concerns or questions  Please  see past updates related to this goal by clicking on the "Past Updates" button in the selected goal      PCP is requesting C3 referral for food assistance. Referral placed on 06/03/20.       Eula Fried, BSW, MSW, [facility] Care Management [email or web address] Phone: [phone]

## 2020-06-07 ENCOUNTER — Telehealth: Payer: Self-pay | Admitting: Nurse Practitioner

## 2020-06-07 NOTE — Telephone Encounter (Signed)
Telephone encounter was:  Unsuccessful.  06/07/2020 Name: Jeb Schloemer. MRN: [id number] DOB: 1961/02/12  Unsuccessful outbound call made today to assist with:  Food Insecurity  Outreach Attempt:  1st Attempt  Could not leave a message vm full. .  [place], Care Management Phone: [phone] Email: [email or web address]

## 2020-06-09 ENCOUNTER — Telehealth: Payer: Self-pay | Admitting: Nurse Practitioner

## 2020-06-09 DIAGNOSIS — Z1211 Encounter for screening for malignant neoplasm of colon: Secondary | ICD-10-CM | POA: Diagnosis not present

## 2020-06-09 DIAGNOSIS — Z1212 Encounter for screening for malignant neoplasm of rectum: Secondary | ICD-10-CM | POA: Diagnosis not present

## 2020-06-09 LAB — COLOGUARD: Cologuard: POSITIVE — AB

## 2020-06-09 NOTE — Telephone Encounter (Signed)
Pt wife is calling to ask should the cologuard be mailed back to the company or bring it to the office? Please advise CB- [phone].

## 2020-06-10 NOTE — Telephone Encounter (Signed)
Patient's wife notified that she should mail back to the company.

## 2020-06-13 ENCOUNTER — Ambulatory Visit (INDEPENDENT_AMBULATORY_CARE_PROVIDER_SITE_OTHER): Payer: Medicare Other | Admitting: Licensed Clinical Social Worker

## 2020-06-13 DIAGNOSIS — F4321 Adjustment disorder with depressed mood: Secondary | ICD-10-CM

## 2020-06-13 DIAGNOSIS — E785 Hyperlipidemia, unspecified: Secondary | ICD-10-CM | POA: Diagnosis not present

## 2020-06-13 DIAGNOSIS — E1165 Type 2 diabetes mellitus with hyperglycemia: Secondary | ICD-10-CM

## 2020-06-13 DIAGNOSIS — E1129 Type 2 diabetes mellitus with other diabetic kidney complication: Secondary | ICD-10-CM

## 2020-06-13 DIAGNOSIS — E1169 Type 2 diabetes mellitus with other specified complication: Secondary | ICD-10-CM | POA: Diagnosis not present

## 2020-06-13 DIAGNOSIS — J4540 Moderate persistent asthma, uncomplicated: Secondary | ICD-10-CM | POA: Diagnosis not present

## 2020-06-13 DIAGNOSIS — R809 Proteinuria, unspecified: Secondary | ICD-10-CM

## 2020-06-13 DIAGNOSIS — E1159 Type 2 diabetes mellitus with other circulatory complications: Secondary | ICD-10-CM

## 2020-06-13 DIAGNOSIS — I152 Hypertension secondary to endocrine disorders: Secondary | ICD-10-CM

## 2020-06-13 NOTE — Patient Instructions (Signed)
Licensed Clinical Social Worker Visit Information  Goals we discussed today:  Goals Addressed              This Visit's Progress   .  SW-"I want to improve my health and self-care" (pt-stated)        Timeframe:  Long-Range Goal Priority:  Medium  Start Date:  05/16/20                         Expected End Date:  08/13/20                    Follow Up Date- 07/28/20  Current Barriers:  . Financial constraints . Limited social support . Level of care concerns . ADL IADL limitations . Literacy concerns . Cognitive Deficits . Lacks knowledge of community resource: available financial support resources within the area as well as socialization opportunities within the area . Grief support related to the recent loss of his mother  Clinical Social Work Clinical Goal(s):  [name] Over the next 120 days, client will work with SW to address concerns related to improving self-care and implementing it whenever able  Interventions: . Patient interviewed and appropriate assessments performed . Patient was informed that current CCM LCSW will be leaving position next month and his next CCM Social Work follow up visit will be with another LCSW. Patient was appreciative of support provided and receptive to news . Patient's mother passed on 06/07/2020 and is experiencing grief symptoms. Patient has had two panic attacks since her passing and contacted front desk about medication to gain relief. PCP informed family that they would need an office visit before prescribing a new medication. Office visit was set for 05/25/20. Patient reports that he is doing better now and but doesn't think his new medication gabapentin is working for pain relief. This medication was started on 05/25/20. Patient is interested in going to a pain clinic and will discuss this with PCP during next visit.  [name] Positive reinforcement provided for working effectively on managing his blood sugars. Patient is very proud of this accomplishment.  [name] CCM  LCSW educated patient on grief counseling through [facility] but patient declined needing this referral at this time again on 06/13/20. Patient reports that his niece works for a [employer] firm and is assisting family with managing his mother's assets.  . He admits that he isn't sleeping well at night. LCSW provided education on healthy sleep hygiene and what that looks like. LCSW encouraged patient to implement a night time routine into his schedule that works best for him and that he is able to maintain. Advised patient to implement deep breathing/grounding/meditation/self-care exercises into his nightly routine to combat racing thoughts at night. LCSW encouraged patient to wake up at the same time each day, make her sleeping environment comfortable, exercise when able, to limit naps and to not eat or drink anything right before bed.  . Patient reports having a cyst on his knee that may need removing. Patient states that this cyst is causing him pain and irritation. Family reported that patient will be scheduling a nearby procedure for his hernia as well.  . Provided mental health counseling with regard to managing everyday stressors and implementing appropriate self-care techniques in order to improve overall health. LCSW provided reflective listening and implemented appropriate interventions to help suppport patient and his emotional needs. Patient admitted anxiety today due to high blood sugar levels. However, patient checked his blood sugar  level before eating anything today. LCSW provided education on healthy nutrition that will improve his self-care and anxiety management.  . Provided patient with information about OTC benefits that he is uses per quarter. Patient's spouse reports that she has started to order diabetic lotion for patient and this is helping him immensely. . Patient reports trying to implement appropriate self-care by exercise and mowing his yard. Patient reports ongoing chronic pain with  his knees and side.  . Discussed plans with patient for ongoing care management follow up and provided patient with direct contact information for care management team . Advised patient to contact CCM embedded practice providers and PCP if any urgent concerns arise. [name] LCSW completed referral for a wheelchair ramp in the past and ramp installation process has been completed.  . Patient reports ongoing chronic pain and difficulty managing this without becoming irritated or ill. Patient was alert, oriented. Patient denies anxiety at this time. Discussed coping skills. SW used active and reflective listening, validated patient's feelings/concerns, and provided emotional support. . Assisted patient/caregiver with obtaining information about health plan benefits . Provided education to patient/caregiver regarding level of care options. . Patient and spouse put up decorations this week in remembrance of their lost family members that passed recently. Positive reinforcement provided to family for using their creativity within their home.  . Brief self-care education provided to both patient and spouse during outreach today. Positive reinforcement provided for positive self-care implementation.  . Family report stable transportation to all upcoming medical appointments.  . Per spouse, patient has worked hard at changing his eating/drinking habits. Patient is no longer drinking soda and drinks a water all day long (with 0 calorie flavor sweeteners that they buy).  . Patient has experienced a few days of high blood sugars (404 today) especially when he has a low appetite and has trouble drinking/eating appropriately. . Patient's dad is currently working on transferring car title from his name to [facility] which family is happy about.   Patient Self Care Activities:  . Attends all scheduled provider appointments . Calls provider office for new concerns or questions  Please see past updates related to this goal  by clicking on the "Past Updates" button in the selected goal          [name], [place], MSW, [email or web address] Phone: [phone]

## 2020-06-13 NOTE — Chronic Care Management (AMB) (Signed)
Chronic Care Management    Clinical Social Work Note  06/13/2020 Name: [name]. MRN: [id number] DOB: 10-Sep-1960  [name]. is a 60 y.o. year old male who is a primary care patient of Cannady, Barbaraann Faster, NP. The CCM team was consulted to assist the patient with chronic disease management and/or care coordination needs related to: Level of Care Concerns, Mental Health Counseling and Resources and Grief Counseling.   Engaged with patient by telephone for follow up visit in response to provider referral for social work chronic care management and care coordination services.   Consent to Services:  The patient was given the following information about Chronic Care Management services today, agreed to services, and gave verbal consent: 1. CCM service includes personalized support from designated clinical staff supervised by the primary care provider, including individualized plan of care and coordination with other care providers 2. 24/7 contact phone numbers for assistance for urgent and routine care needs. 3. Service will only be billed when office clinical staff spend 20 minutes or more in a month to coordinate care. 4. Only one practitioner may furnish and bill the service in a calendar month. 5.The patient may stop CCM services at any time (effective at the end of the month) by phone call to the office staff. 6. The patient will be responsible for cost sharing (co-pay) of up to 20% of the service fee (after annual deductible is met). Patient agreed to services and consent obtained.  Patient agreed to services and consent obtained.   Assessment: Review of patient past medical history, allergies, medications, and health status, including review of relevant consultants reports was performed today as part of a comprehensive evaluation and provision of chronic care management and care coordination services.     SDOH (Social Determinants of Health) assessments and interventions  performed:    Advanced Directives Status: Not addressed in this encounter.  CCM Care Plan  Allergies  Allergen Reactions  . Bee Venom Hives    All kinds of bees  . Other     Certain powders  . Sulfa Antibiotics     Outpatient Encounter Medications as of 06/13/2020  Medication Sig  . acetaminophen (TYLENOL) 325 MG tablet Take 1-2 tablets (325-650 mg total) by mouth every 4 (four) hours as needed for mild pain.  [name] albuterol (PROAIR HFA) 108 (90 Base) MCG/ACT inhaler INHALE 2 PUFFS BY MOUTH EVERY 6 HOURS IF NEEDED FOR COUGH OR WHEEZING.  [name] aspirin 81 MG EC tablet Take 1 tablet (81 mg total) by mouth daily.  [name] atorvastatin (LIPITOR) 80 MG tablet Take 1 tablet (80 mg total) by mouth daily.  . Blood Glucose Monitoring Suppl (ONE TOUCH ULTRA 2) w/Device KIT Use to check blood sugar 4 times a day  . BREO ELLIPTA 100-25 MCG/INH AEPB INHALE 1 PUFF BY MOUTH ONCE DAILY  . Cholecalciferol 1.25 MG (50000 UT) TABS Take 1 tablet by mouth once a week. For 8 weeks and then stop.  Return to office for lab draw.  . clobetasol cream (TEMOVATE) 5.36 % Apply 1 application topically 2 (two) times daily. Apply to affected area  . clopidogrel (PLAVIX) 75 MG tablet Take 1 tablet by mouth daily.  [name] Dextran 70-Hypromellose (ARTIFICIAL TEARS) 0.1-0.3 % SOLN Apply 1 drop to eye 3 (three) times daily. To right eye only.  . diclofenac Sodium (VOLTAREN) 1 % GEL Apply 2 g topically 3 (three) times daily.  . empagliflozin (JARDIANCE) 25 MG TABS tablet Take 1 tablet (25  mg total) by mouth daily.  . fenofibrate 54 MG tablet Take 1 tablet (54 mg total) by mouth daily.  [name] gabapentin (NEURONTIN) 300 MG capsule TAKE 1 CAPSULE BY MOUTH TWICE DAILY  . glucose blood (ONETOUCH ULTRA) test strip To check blood sugar three times daily  . Insulin Pen Needle (PEN NEEDLES) 32G X 4 MM MISC 1 kit by Does not apply route daily.  . Lancets (ONETOUCH ULTRASOFT) lancets Use as instructed  . lisinopril (ZESTRIL) 20 MG tablet Take 1 tablet  (20 mg total) by mouth daily.  . Misc. Devices (PULSE OXIMETER FOR FINGER) MISC To check O2 saturations once daily with asthma and document + check if any shortness of breath  . montelukast (SINGULAIR) 10 MG tablet TAKE 1 TABLET BY MOUTH ONCE EVERY EVENING AT BEDTIME.  . nitroGLYCERIN (NITROSTAT) 0.4 MG SL tablet Place 1 tablet (0.4 mg total) under the tongue every 5 (five) minutes as needed for chest pain.  [name] omeprazole (PRILOSEC) 20 MG capsule Take 1 capsule (20 mg total) by mouth daily.  . polyethylene glycol (MIRALAX) 17 g packet Take 17 g by mouth daily.  [name] tiZANidine (ZANAFLEX) 2 MG tablet TAKE 1 TABLET BY MOUTH EVERY 6 HOURS AS NEEDED MUSCLE SPASMS  . TRESIBA FLEXTOUCH 100 UNIT/ML FlexTouch Pen INJECT 22 UNITS SUBQ ONCE DAILY  . trimethoprim-polymyxin b (POLYTRIM) ophthalmic solution Place 1 drop into the right eye every 6 (six) hours.   [name] ULTRACARE PEN NEEDLES 32G X 5 MM MISC   . Vitamin D, Ergocalciferol, (DRISDOL) 1.25 MG (50000 UNIT) CAPS capsule TAKE 1 CAPSULE BY MOUTH ONCE A WEEK   No facility-administered encounter medications on file as of 06/13/2020.    Patient Active Problem List   Diagnosis Date Noted  . Type 2 diabetes mellitus with proteinuria ([place]) 03/25/2019  . Atherosclerosis of both carotid arteries 03/22/2019  . History of CVA (cerebrovascular accident) 03/06/2019  . Slow transit constipation   . Diplopia   . Uncontrolled type 2 diabetes mellitus with hyperglycemia ([place])   . Obesity 09/03/2018  . Skin tags, multiple acquired 08/23/2016  . Frequent falls 05/15/2016  . Vitamin D deficiency 08/22/2015  . Carpal tunnel syndrome on left 05/12/2015  . External hemorrhoids 12/20/2014  . Allergic rhinitis 10/21/2014  . Diverticulosis 10/21/2014  . Hypertension associated with diabetes ([place]) 10/21/2014  . Hyperlipidemia associated with type 2 diabetes mellitus ([place]) 10/21/2014  . Asthma 10/21/2014  . GERD (gastroesophageal reflux disease) 10/21/2014  . NAFLD  (nonalcoholic fatty liver disease) 10/21/2014  . Arthritis 10/21/2014  . Chest pain with high risk for cardiac etiology 10/21/2014  . Pancreatitis 10/07/2014  . Phenylketonuria (PKU) ([place]) 10/07/2014  . Renal cyst, left 10/07/2014  . Gall bladder polyp 10/01/2013    Conditions to be addressed/monitored: Anxiety and Depression; Mental Health Concerns   Care Plan : General Social Work (Adult)  Updates made by Greg Cutter, LCSW since 06/13/2020 12:00 AM    Problem: Coping Skills (General Plan of Care)     Long-Range Goal: Coping Skills Enhanced   Start Date: 05/16/2020  Note:   Timeframe:  Long-Range Goal Priority:  Medium  Start Date:  05/16/20                         Expected End Date:  08/13/20                    Follow Up Date- 07/28/20  Current Barriers:  . Financial constraints .  Limited social support . Level of care concerns . ADL IADL limitations . Literacy concerns . Cognitive Deficits . Lacks knowledge of community resource: available financial support resources within the area as well as socialization opportunities within the area . Grief support related to the recent loss of his mother  Clinical Social Work Clinical Goal(s):  [name] Over the next 120 days, client will work with SW to address concerns related to improving self-care and implementing it whenever able  Interventions: . Patient interviewed and appropriate assessments performed . Patient was informed that current CCM LCSW will be leaving position next month and his next CCM Social Work follow up visit will be with another LCSW. Patient was appreciative of support provided and receptive to news . Patient's mother passed on June 10, 2020 and is experiencing grief symptoms. Patient has had two panic attacks since her passing and contacted front desk about medication to gain relief. PCP informed family that they would need an office visit before prescribing a new medication. Office visit was set for 05/25/20. Patient  reports that he is doing better now and but doesn't think his new medication gabapentin is working for pain relief. This medication was started on 05/25/20. Patient is interested in going to a pain clinic and will discuss this with PCP during next visit.  [name] Positive reinforcement provided for working effectively on managing his blood sugars. Patient is very proud of this accomplishment.  [name] CCM LCSW educated patient on grief counseling through [facility] but patient declined needing this referral at this time again on 06/13/20. Patient reports that his niece works for a [employer] firm and is assisting family with managing his mother's assets.  . He admits that he isn't sleeping well at night. LCSW provided education on healthy sleep hygiene and what that looks like. LCSW encouraged patient to implement a night time routine into his schedule that works best for him and that he is able to maintain. Advised patient to implement deep breathing/grounding/meditation/self-care exercises into his nightly routine to combat racing thoughts at night. LCSW encouraged patient to wake up at the same time each day, make her sleeping environment comfortable, exercise when able, to limit naps and to not eat or drink anything right before bed.  . Patient reports having a cyst on his knee that may need removing. Patient states that this cyst is causing him pain and irritation. Family reported that patient will be scheduling a nearby procedure for his hernia as well.  . Provided mental health counseling with regard to managing everyday stressors and implementing appropriate self-care techniques in order to improve overall health. LCSW provided reflective listening and implemented appropriate interventions to help suppport patient and his emotional needs. Patient admitted anxiety today due to high blood sugar levels. However, patient checked his blood sugar level before eating anything today. LCSW provided education on healthy nutrition  that will improve his self-care and anxiety management.  . Provided patient with information about OTC benefits that he is uses per quarter. Patient's spouse reports that she has started to order diabetic lotion for patient and this is helping him immensely. . Patient reports trying to implement appropriate self-care by exercise and mowing his yard. Patient reports ongoing chronic pain with his knees and side.  . Discussed plans with patient for ongoing care management follow up and provided patient with direct contact information for care management team . Advised patient to contact CCM embedded practice providers and PCP if any urgent concerns arise. [name] LCSW completed referral for  a wheelchair ramp in the past and ramp installation process has been completed.  . Patient reports ongoing chronic pain and difficulty managing this without becoming irritated or ill. Patient was alert, oriented. Patient denies anxiety at this time. Discussed coping skills. SW used active and reflective listening, validated patient's feelings/concerns, and provided emotional support. . Assisted patient/caregiver with obtaining information about health plan benefits . Provided education to patient/caregiver regarding level of care options. . Patient and spouse put up decorations this week in remembrance of their lost family members that passed recently. Positive reinforcement provided to family for using their creativity within their home.  . Brief self-care education provided to both patient and spouse during outreach today. Positive reinforcement provided for positive self-care implementation.  . Family report stable transportation to all upcoming medical appointments.  . Per spouse, patient has worked hard at changing his eating/drinking habits. Patient is no longer drinking soda and drinks a water all day long (with 0 calorie flavor sweeteners that they buy).  . Patient has experienced a few days of high blood sugars (404  today) especially when he has a low appetite and has trouble drinking/eating appropriately. . Patient's dad is currently working on transferring car title from his name to [facility] which family is happy about.   Patient Self Care Activities:  . Attends all scheduled provider appointments . Calls provider office for new concerns or questions  Please see past updates related to this goal by clicking on the "Past Updates" button in the selected goal        Follow Up Plan: SW will follow up with patient by phone over the next quarter      Eula Fried, [place], MSW, [email or web address] Phone: [phone]

## 2020-06-15 LAB — COLOGUARD: COLOGUARD: POSITIVE — AB

## 2020-06-17 ENCOUNTER — Telehealth: Payer: Self-pay | Admitting: Nurse Practitioner

## 2020-06-17 DIAGNOSIS — R195 Other fecal abnormalities: Secondary | ICD-10-CM

## 2020-06-17 NOTE — Telephone Encounter (Signed)
Tried calling patient, no answer and no VM. Will attempt to call later.

## 2020-06-17 NOTE — Telephone Encounter (Signed)
Attempted to call [place] at [phone], no answer.  Called [name], [place] and wife, able to leave general message to call office for lab result.  Cologuard returned positive for [facility] and a referral to GI will be placed to obtain Cologuard for further assessment.  Will alert nursing staff to try to reach out as well or provide results if patient calls in.

## 2020-06-17 NOTE — Telephone Encounter (Signed)
Telephone encounter was:  Unsuccessful.  06/17/2020 Name: [name]. MRN: [id number] DOB: [birth year]  Unsuccessful outbound call made today to assist with:  Food Insecurity  Outreach Attempt:  2nd Attempt  A HIPAA compliant voice message was left requesting a return call.  Instructed patient to call back at [phone].  [place], Care Management Phone: [phone] Email: [email or web address]

## 2020-06-20 ENCOUNTER — Telehealth: Payer: Self-pay | Admitting: Nurse Practitioner

## 2020-06-20 ENCOUNTER — Telehealth: Payer: Self-pay

## 2020-06-20 ENCOUNTER — Ambulatory Visit (INDEPENDENT_AMBULATORY_CARE_PROVIDER_SITE_OTHER): Payer: Medicare Other | Admitting: Pharmacist

## 2020-06-20 ENCOUNTER — Telehealth: Payer: Self-pay | Admitting: Pharmacist

## 2020-06-20 DIAGNOSIS — I10 Essential (primary) hypertension: Secondary | ICD-10-CM

## 2020-06-20 DIAGNOSIS — R809 Proteinuria, unspecified: Secondary | ICD-10-CM | POA: Diagnosis not present

## 2020-06-20 DIAGNOSIS — E1129 Type 2 diabetes mellitus with other diabetic kidney complication: Secondary | ICD-10-CM

## 2020-06-20 NOTE — Telephone Encounter (Signed)
Received incoming call from patient's wife, [name]. Relayed message regarding cologuard results and referral to GI. Per [name], her phone has been acting up and their primary contact number is [place] phone [phone].

## 2020-06-20 NOTE — Telephone Encounter (Signed)
Wonderful, thank you Almyra Free.  My staff have been trying to get in touch with them.  I have placed the GI referral and so they will be reaching out to schedule.

## 2020-06-20 NOTE — Progress Notes (Signed)
Chronic Care Management Pharmacy Note  06/20/2020 Name:  [name]. MRN:  [id number] DOB:  January 01, 1961  Subjective: [name]. is an 60 y.o. year old male who is a primary patient of Cannady, Barbaraann Faster, NP.  The CCM team was consulted for assistance with disease management and care coordination needs.    Engaged with patient by telephone for follow up visit in response to provider referral for pharmacy case management and/or care coordination services.   Consent to Services:  The patient was given information about Chronic Care Management services, agreed to services, and gave verbal consent prior to initiation of services.  Please see initial visit note for detailed documentation.   Patient Care Team: [name], NP as PCP - General (Nurse Practitioner) Anell Barr, [place] (Optometry) Dionisio David, MD as Consulting Physician (Cardiology) Greg Cutter, LCSW as Social Worker (Licensed Clinical Social Worker) Hall Busing, Nobie Putnam, RN as Case Manager ([place]) Vladimir Faster, [facility] as Pharmacist (Pharmacist)  Recent office visits: 3/16/22[facility] (PCP)-bloodwork, A!C 13.1, endo referral Tresiba to 50 units, vitamin d 50,00 iu qd  Recent consult visits: 06/04/20- [facility] visits: None in previous 6 months  Objective:  Lab Results  Component Value Date   CREATININE 1.03 06/01/2020   BUN 12 06/01/2020   GFRNONAA 66 02/24/2020   GFRAA 76 02/24/2020   NA 135 06/01/2020   K 4.3 06/01/2020   CALCIUM 9.4 06/01/2020   CO2 19 (L) 06/01/2020   GLUCOSE 450 (H) 06/01/2020    Lab Results  Component Value Date/Time   HGBA1C 13.1 (H) 06/01/2020 03:09 PM   HGBA1C 9.4 (H) 02/24/2020 04:21 PM   MICROALBUR 10 06/01/2020 03:09 PM   MICROALBUR 10 07/17/2019 10:24 AM   MICROALBUR 20 08/22/2015 05:04 PM    Last diabetic Eye exam:  Lab Results  Component Value Date/Time   HMDIABEYEEXA No Retinopathy 07/01/2017 12:00 AM     Last diabetic Foot exam: No results found for: HMDIABFOOTEX   Lab Results  Component Value Date   CHOL 207 (H) 06/01/2020   HDL 27 (L) 06/01/2020   LDLCALC 72 06/01/2020   TRIG 694 (HH) 06/01/2020   CHOLHDL 5.3 03/03/2019    Hepatic Function Latest Ref Rng & Units 06/01/2020 05/23/2020 12/30/2019  Total Protein 6.0 - 8.5 g/dL 6.8 7.1 7.1  Albumin 3.8 - 4.9 g/dL 4.5 4.6 4.6  AST 0 - 40 IU/L _0 ALT 0 - 44 IU/L _1 Alk Phosphatase 44 - 121 IU/L 187(H) 147(H) 130(H)  Total Bilirubin 0.0 - 1.2 mg/dL 0.3 0.5 0.7  Bilirubin, Direct 0.1 - 0.5 mg/dL - - -    Lab Results  Component Value Date/Time   TSH 0.881 06/01/2020 03:16 PM   TSH 0.994 03/25/2019 10:25 AM    CBC Latest Ref Rng & Units 05/23/2020 03/25/2019 03/09/2019  WBC 3.4 - 10.8 x10E3/uL 5.6 6.0 6.9  Hemoglobin 13.0 - 17.7 g/dL 13.7 16.2 16.2  Hematocrit 37.5 - 51.0 % 42.6 48.5 48.3  Platelets 150 - 450 x10E3/uL 196 174 189    Lab Results  Component Value Date/Time   VD25OH 16.4 (L) 06/01/2020 03:16 PM   VD25OH 37.2 12/30/2019 02:41 PM    Clinical ASCVD: Yes  The ASCVD Risk score (Goff DC Jr., et al., 2013) failed to calculate for the following reasons:   The patient has a prior MI or stroke diagnosis    Depression screen  [facility] 2/9 09/02/2019 10/06/2018 08/19/2018  Decreased Interest 0 0 0  Down, Depressed, Hopeless 0 0 0  PHQ - 2 Score 0 0 0  Altered sleeping - - -  Tired, decreased energy - - -  Change in appetite - - -  Feeling bad or failure about yourself  - - -  Trouble concentrating - - -  Moving slowly or fidgety/restless - - -  Suicidal thoughts - - -  PHQ-9 Score - - -  Difficult doing work/chores - - -  Some recent data might be hidden       Social History   Tobacco Use  Smoking Status Never Smoker  Smokeless Tobacco Never Used   BP Readings from Last 3 Encounters:  06/01/20 135/77  05/23/20 112/72  02/24/20 114/69   Pulse Readings from Last 3 Encounters:  06/01/20 60  05/23/20  (!) 55  02/24/20 (!) 58   Wt Readings from Last 3 Encounters:  06/01/20 181 lb (82.1 kg)  05/23/20 178 lb (80.7 kg)  02/24/20 181 lb 3.2 oz (82.2 kg)   BMI Readings from Last 3 Encounters:  06/01/20 33.35 kg/m  05/23/20 32.80 kg/m  02/24/20 33.39 kg/m    Assessment/Interventions: Review of patient past medical history, allergies, medications, health status, including review of consultants reports, laboratory and other test data, was performed as part of comprehensive evaluation and provision of chronic care management services.   SDOH:  (Social Determinants of Health) assessments and interventions performed: No  SDOH Screenings   Alcohol Screen: Not on file  Depression (PHQ2-9): Low Risk   . PHQ-2 Score: 0  Financial Resource Strain: High Risk  . Difficulty of Paying Living Expenses: Hard  Food Insecurity: Food Insecurity Present  . Worried About Charity fundraiser in the Last Year: Sometimes true  . Ran Out of Food in the Last Year: Sometimes true  Housing: Not on file  Physical Activity: Not on file  Social Connections: Not on file  Stress: Not on file  Tobacco Use: Low Risk   . Smoking Tobacco Use: Never Smoker  . Smokeless Tobacco Use: Never Used  Transportation Needs: Not on file      Immunization History  Administered Date(s) Administered  . Hepatitis A, Adult 09/26/2016, 05/22/2017  . Hepatitis B, adult 09/26/2016, 05/22/2017, 11/29/2017  . Influenza,inj,Quad PF,6+ Mos 12/22/2012, 12/21/2013, 11/29/2017, 12/02/2018  . Influenza-Unspecified 11/30/2014, 11/18/2015  . Moderna Sars-Covid-2 Vaccination 11/16/2019, 12/14/2019, 12/14/2019  . Pneumococcal Polysaccharide-23 12/22/2012, 11/18/2015    Conditions to be addressed/monitored:  Hypertension, Hyperlipidemia, Diabetes, Coronary Artery Disease, GERD, Asthma and Osteoarthritis  There are no care plans that you recently modified to display for this patient.    Medication Assistance: None required.   Patient affirms current coverage meets needs.  Patient's preferred pharmacy is:  [place], [place] [address] Phone: [phone] Fax: [phone]  Uses pill box? Yes, wife [name] handles medications Pt endorses 80% compliance  We discussed: Benefits of medication synchronization, packaging and delivery as well as enhanced pharmacist oversight with [employer]. Patient decided to: Continue current medication management strategy  Care Plan and Follow Up Patient Decision:  Patient agrees to Care Plan and Follow-up.  Plan: Telephone follow up appointment with care management team member scheduled for:  CPA to follow monthly, PharmD every 2-3 months   Junita Push. Kenton Kingfisher PharmD, [place] [phone]

## 2020-06-20 NOTE — Telephone Encounter (Signed)
Copied from [place] [phone]. Topic: General - Other >> Jun 20, 2020  1:26 PM Keene Breath wrote: Reason for CRM: Patient's wife called on behalf of patient to see if he could get a pulse reader to check his HR and oxygen level.  She explained she is having a hard time getting this device that she says you put on the finger, and wanted to know if she could get one from the office.  Please advise and call patient to discuss further.  CB# [phone]   Would pt need apt for this?

## 2020-06-20 NOTE — Telephone Encounter (Signed)
Please let [employer] know we did get results, also have we been able to get in touch with patient or wife about positive, this note: "I attempted to call [name] and [name] with positive Cologuard results.  No answer, was able to leave general message with [name] to call office for results.  If she calls please alert her, also please attempt to reach out to both of them this afternoon to alert of [place]'s positive Cologuard result, this means he will need colonoscopy to further assess and ensure no precancerous changes and I have placed referral to GI for this.  Thank you. "

## 2020-06-20 NOTE — Telephone Encounter (Signed)
I sent script for this last visit, but must not be able to obtain a pulse ox.  Can we assist with this?

## 2020-06-20 NOTE — Telephone Encounter (Signed)
Called patient to discuss , no answer, left a voicemail for patient to return my call.

## 2020-06-20 NOTE — Telephone Encounter (Signed)
Please advise.

## 2020-06-20 NOTE — Telephone Encounter (Signed)
[employer] [facility] notified.

## 2020-06-20 NOTE — Telephone Encounter (Signed)
Called patient, no answer, unable to leave a message, will try again.

## 2020-06-20 NOTE — Telephone Encounter (Signed)
Lilia Pro calling from [place] is calling to notify Jolene that the Pt has an abnormal [place] result. Wanting to know did the office receive this result by fax on 06/15/20. Please advise Cb- [phone] click on the prompt for provider support Reference Number - [id number]

## 2020-06-21 ENCOUNTER — Telehealth: Payer: Self-pay | Admitting: Nurse Practitioner

## 2020-06-21 NOTE — Telephone Encounter (Signed)
Telephone encounter was:  Unsuccessful.  06/21/2020 Name: [name]. MRN: [id number] DOB: 10-Dec-1960  Unsuccessful outbound call made today to assist with:  Food Insecurity  Outreach Attempt:  3rd Attempt.  Referral closed unable to contact patient.  A HIPAA compliant voice message was left requesting a return call.  Instructed patient to call back at [phone].  [place], Care Management Phone: [phone] Email: [email or web address]

## 2020-06-22 ENCOUNTER — Other Ambulatory Visit: Payer: Self-pay | Admitting: Nurse Practitioner

## 2020-06-22 ENCOUNTER — Telehealth: Payer: Self-pay

## 2020-06-22 NOTE — Patient Instructions (Addendum)
Licensed Clinical Social Worker Visit Information  Goals we discussed today:  Goals Addressed    .  SW-"I want to improve my health and self-care" (pt-stated)        Timeframe:  Long-Range Goal Priority:  Medium  Start Date:  05/16/20                         Expected End Date:  08/13/20                    Follow Up Date- 07/28/20  Current Barriers:  . Financial constraints . Limited social support . Level of care concerns . ADL IADL limitations . Literacy concerns . Cognitive Deficits . Lacks knowledge of community resource: available financial support resources within the area as well as socialization opportunities within the area . Grief support related to the recent loss of his mother  Clinical Social Work Clinical Goal(s):  [name] Over the next 120 days, client will work with SW to address concerns related to improving self-care and implementing it whenever able  Interventions: . Patient interviewed and appropriate assessments performed . Patient was informed that current CCM LCSW will be leaving position next month and his next CCM Social Work follow up visit will be with another LCSW. Patient was appreciative of support provided and receptive to news . Patient's mother passed on 05/15/20 and is experiencing grief symptoms. Patient has had two panic attacks since her passing and contacted front desk about medication to gain relief. PCP informed family that they would need an office visit before prescribing a new medication. Office visit was set for 05/25/20. Patient reports that he is doing better now and but doesn't think his new medication gabapentin is working for pain relief. This medication was started on 05/25/20. Patient is interested in going to a pain clinic and will discuss this with PCP during next visit.  [name] Positive reinforcement provided for working effectively on managing his blood sugars. Patient is very proud of this accomplishment.  [name] CCM LCSW educated patient on grief  counseling through [facility] but patient declined needing this referral at this time. Patient reports that his niece works for a [employer] firm and is assisting family with managing his mother's assets.  . He admits that he isn't sleeping well at night. LCSW provided education on healthy sleep hygiene and what that looks like. LCSW encouraged patient to implement a night time routine into his schedule that works best for him and that he is able to maintain. Advised patient to implement deep breathing/grounding/meditation/self-care exercises into his nightly routine to combat racing thoughts at night. LCSW encouraged patient to wake up at the same time each day, make her sleeping environment comfortable, exercise when able, to limit naps and to not eat or drink anything right before bed.  . Patient reports having a cyst on his knee that may need removing. Patient states that this cyst is causing him pain and irritation. Family reported that patient will be scheduling a nearby procedure for his hernia as well.  . Provided mental health counseling with regard to managing everyday stressors and implementing appropriate self-care techniques in order to improve overall health. LCSW provided reflective listening and implemented appropriate interventions to help suppport patient and his emotional needs. Patient admitted anxiety today due to high blood sugar levels. However, patient checked his blood sugar level before eating anything today. LCSW provided education on healthy nutrition that will improve his self-care and anxiety  management.  . Provided patient with information about OTC benefits that he is uses per quarter. Patient's spouse reports that she has started to order diabetic lotion for patient and this is helping him immensely. . Patient reports trying to implement appropriate self-care by exercise and mowing his yard. Patient reports ongoing chronic pain with his knees and side.  . Discussed plans with  patient for ongoing care management follow up and provided patient with direct contact information for care management team . Advised patient to contact CCM embedded practice providers and PCP if any urgent concerns arise. [name] LCSW completed referral for a wheelchair ramp in the past and ramp installation process has been completed.  . Patient reports ongoing chronic pain and difficulty managing this without becoming irritated or ill. Patient was alert, oriented. Patient denies anxiety at this time. Discussed coping skills. SW used active and reflective listening, validated patient's feelings/concerns, and provided emotional support. . Assisted patient/caregiver with obtaining information about health plan benefits . Provided education to patient/caregiver regarding level of care options. . Patient and spouse put up decorations this week in remembrance of their lost family members that passed recently. Positive reinforcement provided to family for using their creativity within their home.  . Brief self-care education provided to both patient and spouse during outreach today. Positive reinforcement provided for positive self-care implementation.  . Family report stable transportation to all upcoming medical appointments.  . Per spouse, patient has worked hard at changing his eating/drinking habits. Patient is no longer drinking soda and drinks a water all day long (with 0 calorie flavor sweeteners that they buy).  . Patient has experienced a few days of high blood sugars (404 today) especially when he has a low appetite and has trouble drinking/eating appropriately. . Patient's dad is currently working on transferring car title from his name to [facility] which family is happy about.  [name] CCM LCSW placed a C3 referral on 06/03/20 per PCP request for food support.   Patient Self Care Activities:  . Attends all scheduled provider appointments . Calls provider office for new concerns or questions  Please see  past updates related to this goal by clicking on the "Past Updates" button in the selected goal         Eula Fried, [place], MSW, [email or web address] Phone: [phone]

## 2020-06-22 NOTE — Telephone Encounter (Signed)
Future visit in 2 months

## 2020-06-28 ENCOUNTER — Other Ambulatory Visit: Payer: Self-pay

## 2020-06-28 ENCOUNTER — Other Ambulatory Visit: Payer: Medicare Other

## 2020-06-28 DIAGNOSIS — E559 Vitamin D deficiency, unspecified: Secondary | ICD-10-CM

## 2020-06-28 DIAGNOSIS — E785 Hyperlipidemia, unspecified: Secondary | ICD-10-CM

## 2020-06-28 DIAGNOSIS — E1169 Type 2 diabetes mellitus with other specified complication: Secondary | ICD-10-CM

## 2020-06-29 ENCOUNTER — Ambulatory Visit: Payer: Self-pay | Admitting: General Practice

## 2020-06-29 ENCOUNTER — Telehealth: Payer: Self-pay

## 2020-06-29 LAB — NMR, LIPOPROFILE
Cholesterol, Total: 134 mg/dL (ref 100–199)
HDL Particle Number: 32.3 umol/L (ref >=30.5)
HDL-C: 36 mg/dL — ABNORMAL LOW (ref >39)
LDL Particle Number: 686 nmol/L (ref <1000)
LDL Size: 19.9 nm — ABNORMAL LOW (ref >20.5)
LDL-C (NIH Calc): 68 mg/dL (ref 0–99)
LP-IR Score: 67 — ABNORMAL HIGH (ref <=45)
Small LDL Particle Number: 507 nmol/L (ref <=527)
Triglycerides: 179 mg/dL — ABNORMAL HIGH (ref 0–149)

## 2020-06-29 LAB — VITAMIN D 25 HYDROXY (VIT D DEFICIENCY, FRACTURES): Vit D, 25-Hydroxy: 37.7 ng/mL (ref 30.0–100.0)

## 2020-06-29 NOTE — Progress Notes (Signed)
  Chronic Care Management   Outreach Note  06/29/2020 Name: [name]. MRN: [id number] DOB: October 18, 1960  Referred by: [name], NP Reason for referral : Chronic Care Management (RNCM: Follow up for Chronic Disease Management and Care Coordination Needs )   An unsuccessful telephone outreach was attempted today. The patient was referred to the case management team for assistance with care management and care coordination.   Follow Up Plan: A HIPAA compliant phone message was left for the patient providing contact information and requesting a return call.   Noreene Larsson RN, MSN, [facility] Mobile: [phone]

## 2020-06-29 NOTE — Progress Notes (Signed)
Please let [name] know Jovante's labs returned and cholesterol levels are much improved this check, as is Vitamin D level.  Continue all current medications as ordered to help prevent stroke and keep Vitamin D level stable.  Any questions let me know.  Have a great day!! Keep being awesome!!  Thank you for allowing me to participate in your care. Kindest regards, Tiea Manninen

## 2020-07-04 ENCOUNTER — Other Ambulatory Visit: Payer: Self-pay | Admitting: Nurse Practitioner

## 2020-07-04 NOTE — Telephone Encounter (Signed)
Requested medication (s) are due for refill today - yes  Requested medication (s) are on the active medication list -yes  Future visit scheduled -yes  Last refill: 06/01/20 #30 1 RF  Notes to clinic: Request RF non delegated Rx  Requested Prescriptions  Pending Prescriptions Disp Refills   tiZANidine (ZANAFLEX) 2 MG tablet [Pharmacy Med Name: TIZANIDINE HCL 2 MG TAB] 30 tablet 1    Sig: TAKE 1 TABLET BY MOUTH EVERY 6 HOURS AS NEEDED FOR MUSCLE SPASMS      Not Delegated - Cardiovascular:  Alpha-2 Agonists - tizanidine Failed - 07/04/2020  3:56 PM      Failed - This refill cannot be delegated      Passed - Valid encounter within last 6 months    Recent Outpatient Visits           1 month ago Uncontrolled type 2 diabetes mellitus with hyperglycemia ([place])   [place], Jolene T, NP   1 month ago Syncope, unspecified syncope type   [place], Lauren A, NP   4 months ago Uncontrolled type 2 diabetes mellitus with hyperglycemia ([place])   [place], Jolene T, NP   6 months ago Hypertension associated with diabetes ([place])   [place] Cannady, Jolene T, NP   9 months ago Uncontrolled type 2 diabetes mellitus with hyperglycemia ([place])   [place], Barbaraann Faster, NP       Future Appointments             In 1 month [place], Tally Due, MD [place]   In 1 month Cannady, Barbaraann Faster, NP [facility]                 Requested Prescriptions  Pending Prescriptions Disp Refills   tiZANidine (ZANAFLEX) 2 MG tablet [Pharmacy Med Name: TIZANIDINE HCL 2 MG TAB] 30 tablet 1    Sig: TAKE 1 TABLET BY MOUTH EVERY 6 HOURS AS NEEDED FOR MUSCLE SPASMS      Not Delegated - Cardiovascular:  Alpha-2 Agonists - tizanidine Failed - 07/04/2020  3:56 PM      Failed - This refill cannot be delegated      Passed - Valid encounter within last 6 months    Recent Outpatient  Visits           1 month ago Uncontrolled type 2 diabetes mellitus with hyperglycemia ([place])   [place], Jolene T, NP   1 month ago Syncope, unspecified syncope type   [place], Lauren A, NP   4 months ago Uncontrolled type 2 diabetes mellitus with hyperglycemia ([place])   [place], Jolene T, NP   6 months ago Hypertension associated with diabetes ([place])   [place] Cannady, Jolene T, NP   9 months ago Uncontrolled type 2 diabetes mellitus with hyperglycemia ([place])   [place], Barbaraann Faster, NP       Future Appointments             In 1 month Vanga, Tally Due, MD [place]   In 1 month [place], Barbaraann Faster, NP [facility]

## 2020-07-04 NOTE — Telephone Encounter (Signed)
Pt had apt on 06/01/2020 per last note Return in about 3 months (around 09/01/2020) for T2DM, HTN/HLD.  Pt scheduled for 08/31/2020

## 2020-07-22 ENCOUNTER — Telehealth: Payer: Self-pay | Admitting: Pharmacist

## 2020-07-22 NOTE — Chronic Care Management (AMB) (Signed)
Chronic Care Management Pharmacy Assistant   Name: Nasiah Lehenbauer.  MRN: [id number] DOB: [birth year]   Reason for Encounter: Disease State Diabetes Mellitus    Recent office visits:  None noted  Recent consult visits:  None noted  Hospital visits:  None in previous 6 months  Medications: Outpatient Encounter Medications as of 07/22/2020  Medication Sig  . tiZANidine (ZANAFLEX) 2 MG tablet TAKE 1 TABLET BY MOUTH EVERY 6 HOURS AS NEEDED FOR MUSCLE SPASMS  . acetaminophen (TYLENOL) 325 MG tablet Take 1-2 tablets (325-650 mg total) by mouth every 4 (four) hours as needed for mild pain.  [name] albuterol (PROAIR HFA) 108 (90 Base) MCG/ACT inhaler INHALE 2 PUFFS BY MOUTH EVERY 6 HOURS IF NEEDED FOR COUGH OR WHEEZING.  [name] aspirin 81 MG EC tablet Take 1 tablet (81 mg total) by mouth daily.  [name] atorvastatin (LIPITOR) 80 MG tablet Take 1 tablet (80 mg total) by mouth daily.  . Blood Glucose Monitoring Suppl (ONE TOUCH ULTRA 2) w/Device KIT Use to check blood sugar 4 times a day  . BREO ELLIPTA 100-25 MCG/INH AEPB INHALE 1 PUFF BY MOUTH ONCE DAILY  . Cholecalciferol 1.25 MG (50000 UT) TABS Take 1 tablet by mouth once a week. For 8 weeks and then stop.  Return to office for lab draw.  . clobetasol cream (TEMOVATE) 7.85 % Apply 1 application topically 2 (two) times daily. Apply to affected area  . clopidogrel (PLAVIX) 75 MG tablet Take 1 tablet by mouth daily.  [name] Dextran 70-Hypromellose (ARTIFICIAL TEARS) 0.1-0.3 % SOLN Apply 1 drop to eye 3 (three) times daily. To right eye only.  . diclofenac Sodium (VOLTAREN) 1 % GEL APPLY 2 GRAM TO AFFECTED AREA 3 TIMES DAILY  . empagliflozin (JARDIANCE) 25 MG TABS tablet Take 1 tablet (25 mg total) by mouth daily.  . fenofibrate 54 MG tablet Take 1 tablet (54 mg total) by mouth daily.  [name] gabapentin (NEURONTIN) 300 MG capsule TAKE 1 CAPSULE BY MOUTH TWICE DAILY  . glucose blood (ONETOUCH ULTRA) test strip To check blood sugar three times daily  .  Insulin Pen Needle (PEN NEEDLES) 32G X 4 MM MISC 1 kit by Does not apply route daily.  . Lancets (ONETOUCH ULTRASOFT) lancets Use as instructed  . lisinopril (ZESTRIL) 20 MG tablet Take 1 tablet (20 mg total) by mouth daily. (Patient taking differently: Take 20 mg by mouth daily. Taking 1/2 tab daily)  . Misc. Devices (PULSE OXIMETER FOR FINGER) MISC To check O2 saturations once daily with asthma and document + check if any shortness of breath  . montelukast (SINGULAIR) 10 MG tablet TAKE 1 TABLET BY MOUTH ONCE EVERY EVENING AT BEDTIME.  . nitroGLYCERIN (NITROSTAT) 0.4 MG SL tablet Place 1 tablet (0.4 mg total) under the tongue every 5 (five) minutes as needed for chest pain.  [name] omeprazole (PRILOSEC) 20 MG capsule Take 1 capsule (20 mg total) by mouth daily.  . polyethylene glycol (MIRALAX) 17 g packet Take 17 g by mouth daily.  . TRESIBA FLEXTOUCH 100 UNIT/ML FlexTouch Pen INJECT 22 UNITS SUBQ ONCE DAILY (Patient taking differently: INJECT 50 UNITS SUBQ ONCE DAILY)  . trimethoprim-polymyxin b (POLYTRIM) ophthalmic solution Place 1 drop into the right eye every 6 (six) hours.   [name] ULTRACARE PEN NEEDLES 32G X 5 MM MISC   . Vitamin D, Ergocalciferol, (DRISDOL) 1.25 MG (50000 UNIT) CAPS capsule TAKE 1 CAPSULE BY MOUTH ONCE A WEEK   No facility-administered encounter medications on file as  of 07/22/2020.   Recent Relevant Labs: Lab Results  Component Value Date/Time   HGBA1C 13.1 (H) 06/01/2020 03:09 PM   HGBA1C 9.4 (H) 02/24/2020 04:21 PM   MICROALBUR 10 06/01/2020 03:09 PM   MICROALBUR 10 07/17/2019 10:24 AM   MICROALBUR 20 08/22/2015 05:04 PM    Kidney Function Lab Results  Component Value Date/Time   CREATININE 1.03 06/01/2020 03:16 PM   CREATININE 1.26 05/23/2020 04:15 PM   GFRNONAA 66 02/24/2020 04:22 PM   GFRAA 76 02/24/2020 04:22 PM    . Current antihyperglycemic regimen:  o Jardiance 25 mg Take 1 tab daily o Tresiba Inject 22units SUBQ once daily  . What recent  interventions/DTPs have been made to improve glycemic control:  o None noted . Have there been any recent hospitalizations or ED visits since last visit with CPP? No   . Patient denies hypoglycemic symptoms, including Pale, Sweaty, Shaky, Hungry, Nervous/irritable and Vision changes   . Patient reports hyperglycemic symptoms, including Nauseas and shaky   . How often are you checking your blood sugar? 3-4 times daily   . What are your blood sugars ranging?  o Fasting:  o Before meals:  o After meals: 07/18/20 149 07/19/20 213 07/20/20 323 07/25/20 462 o Bedtime:   . During the week, how often does your blood glucose drop below 70? Never   . Are you checking your feet daily/regularly?  o Patients wife states he checks his feet daily.   Adherence Review: Is the patient currently on a STATIN medication? Yes Is the patient currently on ACE/ARB medication? Yes Does the patient have >5 day gap between last estimated fill dates? Yes   Patient has an appointment with gastroenterologist 08/03/20  due to a positive cologuard.  Patients wife states she checks his o2 and pulse and his o2 has been around 95.  Star Rating Drugs: Atorvastatin 80 mg Last filled: 03/20/2020 90 DS Empagliflozin 25 mg Last filled:07/20/2020 30 DS Lisinopril 20 mg Last filled:03/20/2020 90 DS  [facility] Clinical Pharmacist Assistant [phone]

## 2020-07-28 ENCOUNTER — Ambulatory Visit (INDEPENDENT_AMBULATORY_CARE_PROVIDER_SITE_OTHER): Payer: Medicare Other | Admitting: Licensed Clinical Social Worker

## 2020-07-28 DIAGNOSIS — R195 Other fecal abnormalities: Secondary | ICD-10-CM

## 2020-07-28 DIAGNOSIS — R809 Proteinuria, unspecified: Secondary | ICD-10-CM | POA: Diagnosis not present

## 2020-07-28 DIAGNOSIS — E1129 Type 2 diabetes mellitus with other diabetic kidney complication: Secondary | ICD-10-CM

## 2020-07-28 DIAGNOSIS — E1159 Type 2 diabetes mellitus with other circulatory complications: Secondary | ICD-10-CM

## 2020-07-28 DIAGNOSIS — I152 Hypertension secondary to endocrine disorders: Secondary | ICD-10-CM

## 2020-07-28 DIAGNOSIS — Z8673 Personal history of transient ischemic attack (TIA), and cerebral infarction without residual deficits: Secondary | ICD-10-CM

## 2020-08-01 NOTE — Chronic Care Management (AMB) (Signed)
Chronic Care Management    Clinical Social Work Note  08/01/2020 Name: [name]. MRN: [id number] DOB: [birth year]  Alex Metro. is a 60 y.o. year old male who is a primary care patient of Cannady, Barbaraann Faster, NP. The CCM team was consulted to assist the patient with chronic disease management and/or care coordination needs related to: Mental Health Counseling and Resources.   Engaged with patient's spouse by telephone for follow up visit in response to provider referral for social work chronic care management and care coordination services.   Consent to Services:  The patient was given information about Chronic Care Management services, agreed to services, and gave verbal consent prior to initiation of services.  Please see initial visit note for detailed documentation.   Patient agreed to services and consent obtained.   Assessment: Patient's spouse provided all information during this encounter. Patient is doing well; however, anxiety and depression symptoms were triggered by grief and upcoming colonoscopy. Patient is utilizing healthy coping skills to assist in management of symptoms. See Care Plan below for interventions and patient self-care actives. Recent life changes /stressors: Health conditions, financial strain, and grief Recommendation: Patient may benefit from, and is in agreement to utilization of strategies discussed.  Follow up Plan: Patient would like continued follow-up.  CCM LCSW will follow up with patient 10/14/20. Patient will call office if needed prior to next encounter.   SDOH (Social Determinants of Health) assessments and interventions performed:    Advanced Directives Status: Not addressed in this encounter.  CCM Care Plan  Allergies  Allergen Reactions  . Bee Venom Hives    All kinds of bees  . Other     Certain powders  . Sulfa Antibiotics     Outpatient Encounter Medications as of 07/28/2020  Medication Sig  . acetaminophen  (TYLENOL) 325 MG tablet Take 1-2 tablets (325-650 mg total) by mouth every 4 (four) hours as needed for mild pain.  [name] albuterol (PROAIR HFA) 108 (90 Base) MCG/ACT inhaler INHALE 2 PUFFS BY MOUTH EVERY 6 HOURS IF NEEDED FOR COUGH OR WHEEZING.  [name] aspirin 81 MG EC tablet Take 1 tablet (81 mg total) by mouth daily.  [name] atorvastatin (LIPITOR) 80 MG tablet Take 1 tablet (80 mg total) by mouth daily.  . Blood Glucose Monitoring Suppl (ONE TOUCH ULTRA 2) w/Device KIT Use to check blood sugar 4 times a day  . BREO ELLIPTA 100-25 MCG/INH AEPB INHALE 1 PUFF BY MOUTH ONCE DAILY  . Cholecalciferol 1.25 MG (50000 UT) TABS Take 1 tablet by mouth once a week. For 8 weeks and then stop.  Return to office for lab draw.  . clobetasol cream (TEMOVATE) 0.62 % Apply 1 application topically 2 (two) times daily. Apply to affected area  . clopidogrel (PLAVIX) 75 MG tablet Take 1 tablet by mouth daily.  [name] Dextran 70-Hypromellose (ARTIFICIAL TEARS) 0.1-0.3 % SOLN Apply 1 drop to eye 3 (three) times daily. To right eye only.  . diclofenac Sodium (VOLTAREN) 1 % GEL APPLY 2 GRAM TO AFFECTED AREA 3 TIMES DAILY  . empagliflozin (JARDIANCE) 25 MG TABS tablet Take 1 tablet (25 mg total) by mouth daily.  . fenofibrate 54 MG tablet Take 1 tablet (54 mg total) by mouth daily.  [name] gabapentin (NEURONTIN) 300 MG capsule TAKE 1 CAPSULE BY MOUTH TWICE DAILY  . glucose blood (ONETOUCH ULTRA) test strip To check blood sugar three times daily  . Insulin Pen Needle (PEN NEEDLES) 32G X 4 MM MISC  1 kit by Does not apply route daily.  . Lancets (ONETOUCH ULTRASOFT) lancets Use as instructed  . lisinopril (ZESTRIL) 20 MG tablet Take 1 tablet (20 mg total) by mouth daily. (Patient taking differently: Take 20 mg by mouth daily. Taking 1/2 tab daily)  . Misc. Devices (PULSE OXIMETER FOR FINGER) MISC To check O2 saturations once daily with asthma and document + check if any shortness of breath  . montelukast (SINGULAIR) 10 MG tablet TAKE 1 TABLET BY  MOUTH ONCE EVERY EVENING AT BEDTIME.  . nitroGLYCERIN (NITROSTAT) 0.4 MG SL tablet Place 1 tablet (0.4 mg total) under the tongue every 5 (five) minutes as needed for chest pain.  [name] omeprazole (PRILOSEC) 20 MG capsule Take 1 capsule (20 mg total) by mouth daily.  . polyethylene glycol (MIRALAX) 17 g packet Take 17 g by mouth daily.  [name] tiZANidine (ZANAFLEX) 2 MG tablet TAKE 1 TABLET BY MOUTH EVERY 6 HOURS AS NEEDED FOR MUSCLE SPASMS  . TRESIBA FLEXTOUCH 100 UNIT/ML FlexTouch Pen INJECT 22 UNITS SUBQ ONCE DAILY (Patient taking differently: INJECT 50 UNITS SUBQ ONCE DAILY)  . trimethoprim-polymyxin b (POLYTRIM) ophthalmic solution Place 1 drop into the right eye every 6 (six) hours.   [name] ULTRACARE PEN NEEDLES 32G X 5 MM MISC   . Vitamin D, Ergocalciferol, (DRISDOL) 1.25 MG (50000 UNIT) CAPS capsule TAKE 1 CAPSULE BY MOUTH ONCE A WEEK   No facility-administered encounter medications on file as of 07/28/2020.    Patient Active Problem List   Diagnosis Date Noted  . Positive colorectal cancer screening using Cologuard test 06/17/2020  . Type 2 diabetes mellitus with proteinuria ([place]) 03/25/2019  . Atherosclerosis of both carotid arteries 03/22/2019  . History of CVA (cerebrovascular accident) 03/06/2019  . Slow transit constipation   . Diplopia   . Uncontrolled type 2 diabetes mellitus with hyperglycemia ([place])   . Obesity 09/03/2018  . Skin tags, multiple acquired 08/23/2016  . Frequent falls 05/15/2016  . Vitamin D deficiency 08/22/2015  . Carpal tunnel syndrome on left 05/12/2015  . External hemorrhoids 12/20/2014  . Allergic rhinitis 10/21/2014  . Diverticulosis 10/21/2014  . Hypertension associated with diabetes ([place]) 10/21/2014  . Hyperlipidemia associated with type 2 diabetes mellitus ([place]) 10/21/2014  . Asthma 10/21/2014  . GERD (gastroesophageal reflux disease) 10/21/2014  . NAFLD (nonalcoholic fatty liver disease) 10/21/2014  . Arthritis 10/21/2014  . Chest pain with high risk  for cardiac etiology 10/21/2014  . Pancreatitis 10/07/2014  . Phenylketonuria (PKU) ([place]) 10/07/2014  . Renal cyst, left 10/07/2014  . Gall bladder polyp 10/01/2013    Conditions to be addressed/monitored: Anxiety and Depression; Mental Health Concerns   Care Plan : General Social Work (Adult)  Updates made by Rebekah Chesterfield, LCSW since 08/01/2020 12:00 AM    Problem: Coping Skills (General Plan of Care)     Long-Range Goal: Coping Skills Enhanced   Start Date: 05/16/2020  This Visit's Progress: On track  Priority: Medium  Note:   Timeframe:  Long-Range Goal Priority:  Medium  Start Date:  05/16/20                         Expected End Date:  11/16/20                    Follow Up Date- 10/14/20  Current Barriers:  . Financial constraints . Limited social support . Level of care concerns . ADL IADL limitations . Literacy concerns . Cognitive Deficits .  Lacks knowledge of community resource: available financial support resources within the area as well as socialization opportunities within the area . Grief support related to the recent loss of his mother  Clinical Social Work Clinical Goal(s):  [name] Over the next 120 days, client will work with SW to address concerns related to improving self-care and implementing it whenever able  Interventions: . Patient's spouse was interviewed in regards to patient and appropriate assessments performed . Patient's spouse shared that patient has complained of low energy at times. This Mother's Day patient and spouse had plans to visit their mothers' graves to place flowers, as this was the first Mother's Day since the passing of pt's mother . Patient also has been experiencing anxiety triggered by upcoming colonoscopy. States a close friend recently passed due to colon cancer. Additional stressors include financial strain with paying medical bills . Patient's blood sugars have improved since he has been actively monitoring what he eats and drinks  to promote health and well-being. His sleep has improved . Patient has been mowing the yard and using exercise bike to promote activity . Patient utilizes pantries to obtain food, when needed . [employer] is scheduled to visit the home on 08/05/20 to discuss medical benefits . CCM LCSW reviewed upcoming appointments  . Collaboration with RN-CCM LCSW will message RNCM that family has been attempting to contact her to provide an update, per request . Discussed plans with patient for ongoing care management follow up and provided patient with direct contact information for care management team . Advised patient's spouse to contact CCM embedded practice providers and PCP if any urgent concerns arise. . Brief self-care education provided to spouse during outreach today. Positive reinforcement provided for positive self-care implementation.   Patient Self Care Activities:  . Attend all scheduled provider appointments . Call provider office for new concerns or questions . Continue to utilize healthy coping skills to assist in management of symptoms       Christa See, MSW, [email or web address] Phone [phone] 8:21 AM

## 2020-08-01 NOTE — Patient Instructions (Signed)
Visit Information  Goals Addressed              This Visit's Progress     Patient Stated   .  SW-"I want to improve my health and self-care" (pt-stated)   On track     Timeframe:  Long-Range Goal Priority:  Medium  Start Date:  05/16/20                         Expected End Date:  11/16/20                    Follow Up Date- 10/14/20  Patient Self Care Activities:  . Attend all scheduled provider appointments . Call provider office for new concerns or questions . Continue to utilize healthy coping skills to assist in management of symptoms       Patient verbalizes understanding of instructions provided today.   Telephone follow up appointment with care management team member scheduled for: 10/14/20  Christa See, MSW, [email or web address] Phone [phone] 8:23 AM

## 2020-08-03 ENCOUNTER — Other Ambulatory Visit: Payer: Self-pay

## 2020-08-03 ENCOUNTER — Ambulatory Visit: Payer: Medicare Other | Admitting: Gastroenterology

## 2020-08-03 ENCOUNTER — Encounter: Payer: Self-pay | Admitting: Gastroenterology

## 2020-08-03 VITALS — BP 128/74 | HR 58 | Temp 98.0°F | Ht 61.77 in | Wt 177.2 lb

## 2020-08-03 DIAGNOSIS — R195 Other fecal abnormalities: Secondary | ICD-10-CM

## 2020-08-03 DIAGNOSIS — K219 Gastro-esophageal reflux disease without esophagitis: Secondary | ICD-10-CM

## 2020-08-03 MED ORDER — MAGNESIUM CITRATE PO SOLN: 1.0000 | Freq: Once | ORAL | 0 refills | Status: AC

## 2020-08-03 MED ORDER — NA SULFATE-K SULFATE-MG SULF 17.5-3.13-1.6 GM/177ML PO SOLN: 354.0000 mL | Freq: Once | ORAL | 0 refills | Status: AC

## 2020-08-03 NOTE — Progress Notes (Signed)
Alex Darby, MD [address]  [place]  [place], [place] [address]  Main: [phone]  Fax: [phone]    Gastroenterology Consultation  Referring Provider:     [name], NP Primary Care Physician:  [name], NP Primary Gastroenterologist:  Dr. Cephas Rogers Reason for Consultation:     Cologuard positive        HPI:   Gurkirat Basher. is a 60 y.o. male referred by Dr. [name], NP  for consultation & management of Cologuard positive.  Patient has history of metabolic syndrome, fatty liver is referred because of Cologuard positive test from 06/09/2020.  Patient is accompanied by his wife today, she does report that patient is not compliant with diabetic diet.  His hemoglobin A1c is 8.6.  And he is not following low-sodium diet.  Patient denies any GI symptoms today.  He has history of reflux for which he takes omeprazole 20 mg daily.  NSAIDs: None  Antiplts/Anticoagulants/Anti thrombotics: Aspirin and Plavix for history of CVA GI Procedures:  Upper endoscopy 12/27/2016 - Normal duodenal bulb and second portion of the duodenum. - Erythematous mucosa in the gastric body. - Multiple gastric polyps. Biopsied. - A small amount of food (residue) in the stomach. - Esophagogastric landmarks identified. - Normal upper third of esophagus, middle third of esophagus, lower third of esophagus and gastroesophageal Junction. DIAGNOSIS:  A. STOMACH; COLD BIOPSY:  - PROTON PUMP INHIBITOR EFFECT.  - NEGATIVE FOR INTESTINAL METAPLASIA, DYSPLASIA, AND MALIGNANCY.  - NEGATIVE FOR HELICOBACTER PYLORI IN HEMATOXYLIN AND EOSIN SECTIONS.   B. STOMACH POLYPS, BODY; COLD BIOPSY:  - FUNDIC GLAND POLYPS, MULTIPLE FRAGMENTS.  - NEGATIVE FOR DYSPLASIA AND MALIGNANCY.   Past Medical History:  Diagnosis Date  . Arrhythmia   . Asthma   . Cyst of kidney, acquired   . Diabetes mellitus without complication ([place]) 4259   type 2  . GERD (gastroesophageal  reflux disease)   . History of chicken pox   . History of measles as a child   . History of PKU   . Hyperlipidemia   . Hypertension   . IBS (irritable bowel syndrome)   . Irregular heart beat   . Mentally challenged   . Pancreatitis   . Stroke [facility]) 03/02/2019    Past Surgical History:  Procedure Laterality Date  . Cardiac Catherization     [facility]  . CARDIAC CATHETERIZATION     [facility]  . COLONOSCOPY    . ESOPHAGOGASTRODUODENOSCOPY (EGD) WITH PROPOFOL N/A 12/27/2016   Procedure: ESOPHAGOGASTRODUODENOSCOPY (EGD) WITH PROPOFOL;  Surgeon: Lin Landsman, MD;  Location: [facility];  Service: Gastroenterology;  Laterality: N/A;  . HEMORRHOID SURGERY    . Ligament Removal Left    of left thumb: dr. Cleda Mccreedy  . ligament removal  of left thumb     Dr. Cleda Mccreedy  . NM GATED MYOCARDIAL STUDY (ARMX HX)  06/23/2014   [place]. Normal    Current Outpatient Medications:  .  acetaminophen (TYLENOL) 325 MG tablet, Take 1-2 tablets (325-650 mg total) by mouth every 4 (four) hours as needed for mild pain., Disp:  , Rfl:  .  albuterol (PROAIR HFA) 108 (90 Base) MCG/ACT inhaler, INHALE 2 PUFFS BY MOUTH EVERY 6 HOURS IF NEEDED FOR COUGH OR WHEEZING., Disp: 8.5 g, Rfl: 3 .  aspirin 81 MG EC tablet, Take 1 tablet (81 mg total) by mouth daily., Disp: 180 tablet, Rfl: 0 .  atorvastatin (LIPITOR) 80 MG tablet, Take 1  tablet (80 mg total) by mouth daily., Disp: 90 tablet, Rfl: 3 .  Blood Glucose Monitoring Suppl (ONE TOUCH ULTRA 2) w/Device KIT, Use to check blood sugar 4 times a day, Disp: 1 kit, Rfl: 0 .  BREO ELLIPTA 100-25 MCG/INH AEPB, INHALE 1 PUFF BY MOUTH ONCE DAILY, Disp: 60 each, Rfl: 1 .  Cholecalciferol 1.25 MG (50000 UT) TABS, Take 1 tablet by mouth once a week. For 8 weeks and then stop.  Return to office for lab draw., Disp: 12 tablet, Rfl: 3 .  clobetasol cream (TEMOVATE) 4.12 %, Apply 1 application topically 2 (two) times daily. Apply to affected area, Disp: 30 g, Rfl: 0 .  clopidogrel  (PLAVIX) 75 MG tablet, Take 1 tablet by mouth daily., Disp: , Rfl:  .  Dextran 70-Hypromellose (ARTIFICIAL TEARS) 0.1-0.3 % SOLN, Apply 1 drop to eye 3 (three) times daily. To right eye only., Disp: 30 mL, Rfl: 3 .  diclofenac Sodium (VOLTAREN) 1 % GEL, APPLY 2 GRAM TO AFFECTED AREA 3 TIMES DAILY, Disp: 100 g, Rfl: 0 .  empagliflozin (JARDIANCE) 25 MG TABS tablet, Take 1 tablet (25 mg total) by mouth daily., Disp: 30 tablet, Rfl: 1 .  fenofibrate 54 MG tablet, Take 1 tablet (54 mg total) by mouth daily., Disp: 90 tablet, Rfl: 3 .  gabapentin (NEURONTIN) 300 MG capsule, TAKE 1 CAPSULE BY MOUTH TWICE DAILY, Disp: 60 capsule, Rfl: 1 .  glucose blood (ONETOUCH ULTRA) test strip, To check blood sugar three times daily, Disp: 300 each, Rfl: 3 .  Insulin Pen Needle (PEN NEEDLES) 32G X 4 MM MISC, 1 kit by Does not apply route daily., Disp: 100 each, Rfl: 4 .  Lancets (ONETOUCH ULTRASOFT) lancets, Use as instructed, Disp: 100 each, Rfl: 12 .  lisinopril (ZESTRIL) 20 MG tablet, Take 1 tablet (20 mg total) by mouth daily. (Patient taking differently: Take 20 mg by mouth daily. Taking 1/2 tab daily), Disp: 90 tablet, Rfl: 4 .  magnesium citrate SOLN, Take 296 mLs (1 Bottle total) by mouth once for 1 dose., Disp: 195 mL, Rfl: 0 .  Misc. Devices (PULSE OXIMETER FOR FINGER) MISC, To check O2 saturations once daily with asthma and document + check if any shortness of breath, Disp: 1 each, Rfl: 1 .  montelukast (SINGULAIR) 10 MG tablet, TAKE 1 TABLET BY MOUTH ONCE EVERY EVENING AT BEDTIME., Disp: 30 tablet, Rfl: 11 .  Na Sulfate-K Sulfate-Mg Sulf 17.5-3.13-1.6 GM/177ML SOLN, Take 354 mLs by mouth once for 1 dose., Disp: 354 mL, Rfl: 0 .  nitroGLYCERIN (NITROSTAT) 0.4 MG SL tablet, Place 1 tablet (0.4 mg total) under the tongue every 5 (five) minutes as needed for chest pain., Disp: 50 tablet, Rfl: 3 .  omeprazole (PRILOSEC) 20 MG capsule, Take 1 capsule (20 mg total) by mouth daily., Disp: 30 capsule, Rfl: 12 .   polyethylene glycol (MIRALAX) 17 g packet, Take 17 g by mouth daily., Disp: 14 each, Rfl: 0 .  tiZANidine (ZANAFLEX) 2 MG tablet, TAKE 1 TABLET BY MOUTH EVERY 6 HOURS AS NEEDED FOR MUSCLE SPASMS, Disp: 30 tablet, Rfl: 1 .  TRESIBA FLEXTOUCH 100 UNIT/ML FlexTouch Pen, INJECT 22 UNITS SUBQ ONCE DAILY (Patient taking differently: INJECT 50 UNITS SUBQ ONCE DAILY), Disp: 9 mL, Rfl: 0 .  trimethoprim-polymyxin b (POLYTRIM) ophthalmic solution, Place 1 drop into the right eye every 6 (six) hours. , Disp: , Rfl:  .  ULTRACARE PEN NEEDLES 32G X 5 MM MISC, , Disp: , Rfl:  .  Vitamin  D, Ergocalciferol, (DRISDOL) 1.25 MG (50000 UNIT) CAPS capsule, TAKE 1 CAPSULE BY MOUTH ONCE A WEEK, Disp: 4 capsule, Rfl: 12   Family History  Problem Relation Age of Onset  . Cancer Mother        throat  . Diabetes Father   . Heart disease Father   . Cancer Maternal Grandmother   . Cancer Maternal Grandfather        pancreatic  . Cancer Paternal Grandfather      Social History   Tobacco Use  . Smoking status: Never Smoker  . Smokeless tobacco: Never Used  Vaping Use  . Vaping Use: Never used  Substance Use Topics  . Alcohol use: No    Alcohol/week: 0.0 standard drinks  . Drug use: No    Allergies as of 08/03/2020 - Review Complete 08/03/2020  Allergen Reaction Noted  . Bee venom Hives 10/22/2014  . Other  10/09/2014  . Sulfa antibiotics  01/04/2014    Review of Systems:    All systems reviewed and negative except where noted in HPI.   Physical Exam:  BP 128/74 (BP Location: Left Arm, Patient Position: Sitting, Cuff Size: Normal)   Pulse (!) 58   Temp 98 F (36.7 C) (Oral)   Ht 5' 1.77" (1.569 m)   Wt 177 lb 4 oz (80.4 kg)   BMI 32.66 kg/m  No LMP for male patient.  General:   Alert,  Well-developed, well-nourished, pleasant and cooperative in NAD Head:  Normocephalic and atraumatic. Eyes:  Sclera clear, no icterus.   Conjunctiva pink. Ears:  Normal auditory acuity. Nose:  No deformity,  discharge, or lesions. Mouth:  No deformity or lesions,oropharynx pink & moist. Neck:  Supple; no masses or thyromegaly. Lungs:  Respirations even and unlabored.  Clear throughout to auscultation.   No wheezes, crackles, or rhonchi. No acute distress. Heart:  Regular rate and rhythm; no murmurs, clicks, rubs, or gallops. Abdomen:  Normal bowel sounds. Soft, obese, non-tender and non-distended without masses, hepatosplenomegaly or hernias noted.  No guarding or rebound tenderness.   Rectal: Not performed Msk:  Symmetrical without gross deformities. Good, equal movement & strength bilaterally. Pulses:  Normal pulses noted. Extremities:  No clubbing or edema.  No cyanosis. Neurologic:  Alert and oriented x3;  grossly normal neurologically. Skin:  Intact without significant lesions or rashes. No jaundice. Lymph Nodes:  No significant cervical adenopathy. Psych:  Alert and cooperative. Normal mood and affect.  Imaging Studies: Reviewed  Assessment and Plan:   Detron Carras. is a 60 y.o. male with history of metabolic syndrome, fatty liver, chronic GERD, fundic land polyps is seen in consultation for Cologuard positive  Cologuard positive Recommend diagnostic colonoscopy Patient is preop clearance and interruption of Plavix for 5 days prior to colonoscopy from his cardiologist Recommend 2-day prep  I have discussed alternative options, risks & benefits,  which include, but are not limited to, bleeding, infection, perforation,respiratory complication & drug reaction.  The patient agrees with this plan & written consent will be obtained.     Fatty liver No evidence of fibrosis based on ultrasound elastography in 2018 Mildly elevated alkaline phosphatase levels Strict control of diabetes, healthy diet and exercise   Follow up as needed   Alex Darby, MD

## 2020-08-03 NOTE — Patient Instructions (Addendum)
Diabetes Mellitus and Nutrition, Adult When you have diabetes, or diabetes mellitus, it is very important to have healthy eating habits because your blood sugar (glucose) levels are greatly affected by what you eat and drink. Eating healthy foods in the right amounts, at about the same times every day, can help you:  Control your blood glucose.  Lower your risk of heart disease.  Improve your blood pressure.  Reach or maintain a healthy weight. What can affect my meal plan? Every person with diabetes is different, and each person has different needs for a meal plan. Your health care provider may recommend that you work with a dietitian to make a meal plan that is best for you. Your meal plan may vary depending on factors such as:  The calories you need.  The medicines you take.  Your weight.  Your blood glucose, blood pressure, and cholesterol levels.  Your activity level.  Other health conditions you have, such as heart or kidney disease. How do carbohydrates affect me? Carbohydrates, also called carbs, affect your blood glucose level more than any other type of food. Eating carbs naturally raises the amount of glucose in your blood. Carb counting is a method for keeping track of how many carbs you eat. Counting carbs is important to keep your blood glucose at a healthy level, especially if you use insulin or take certain oral diabetes medicines. It is important to know how many carbs you can safely have in each meal. This is different for every person. Your dietitian can help you calculate how many carbs you should have at each meal and for each snack. How does alcohol affect me? Alcohol can cause a sudden decrease in blood glucose (hypoglycemia), especially if you use insulin or take certain oral diabetes medicines. Hypoglycemia can be a life-threatening condition. Symptoms of hypoglycemia, such as sleepiness, dizziness, and confusion, are similar to symptoms of having too much  alcohol.  Do not drink alcohol if: ? Your health care provider tells you not to drink. ? You are pregnant, may be pregnant, or are planning to become pregnant.  If you drink alcohol: ? Do not drink on an empty stomach. ? Limit how much you use to:  0-1 drink a day for women.  0-2 drinks a day for men. ? Be aware of how much alcohol is in your drink. In the [place] one drink equals one 12 oz bottle of beer (355 mL), one 5 oz glass of wine (148 mL), or one 1 oz glass of hard liquor (44 mL). ? Keep yourself hydrated with water, diet soda, or unsweetened iced tea.  Keep in mind that regular soda, juice, and other mixers may contain a lot of sugar and must be counted as carbs. What are tips for following this plan? Reading food labels  Start by checking the serving size on the "Nutrition Facts" label of packaged foods and drinks. The amount of calories, carbs, fats, and other nutrients listed on the label is based on one serving of the item. Many items contain more than one serving per package.  Check the total grams (g) of carbs in one serving. You can calculate the number of servings of carbs in one serving by dividing the total carbs by 15. For example, if a food has 30 g of total carbs per serving, it would be equal to 2 servings of carbs.  Check the number of grams (g) of saturated fats and trans fats in one serving. Choose foods that have  a low amount or none of these fats.  Check the number of milligrams (mg) of salt (sodium) in one serving. Most people should limit total sodium intake to less than 2,300 mg per day.  Always check the nutrition information of foods labeled as "low-fat" or "nonfat." These foods may be higher in added sugar or refined carbs and should be avoided.  Talk to your dietitian to identify your daily goals for nutrients listed on the label. Shopping  Avoid buying canned, pre-made, or processed foods. These foods tend to be high in fat, sodium, and added  sugar.  Shop around the outside edge of the grocery store. This is where you will most often find fresh fruits and vegetables, bulk grains, fresh meats, and fresh dairy. Cooking  Use low-heat cooking methods, such as baking, instead of high-heat cooking methods like deep frying.  Cook using healthy oils, such as olive, canola, or sunflower oil.  Avoid cooking with butter, cream, or high-fat meats. Meal planning  Eat meals and snacks regularly, preferably at the same times every day. Avoid going long periods of time without eating.  Eat foods that are high in fiber, such as fresh fruits, vegetables, beans, and whole grains. Talk with your dietitian about how many servings of carbs you can eat at each meal.  Eat 4-6 oz (112-168 g) of lean protein each day, such as lean meat, chicken, fish, eggs, or tofu. One ounce (oz) of lean protein is equal to: ? 1 oz (28 g) of meat, chicken, or fish. ? 1 egg. ?  cup (62 g) of tofu.  Eat some foods each day that contain healthy fats, such as avocado, nuts, seeds, and fish.   What foods should I eat? Fruits Berries. Apples. Oranges. Peaches. Apricots. Plums. Grapes. Mango. Papaya. Pomegranate. Kiwi. Cherries. Vegetables Lettuce. Spinach. Leafy greens, including kale, chard, collard greens, and mustard greens. Beets. Cauliflower. Cabbage. Broccoli. Carrots. Green beans. Tomatoes. Peppers. Onions. Cucumbers. Brussels sprouts. Grains Whole grains, such as whole-wheat or whole-grain bread, crackers, tortillas, cereal, and pasta. Unsweetened oatmeal. Quinoa. Brown or wild rice. Meats and other proteins Seafood. Poultry without skin. Lean cuts of poultry and beef. Tofu. Nuts. Seeds. Dairy Low-fat or fat-free dairy products such as milk, yogurt, and cheese. The items listed above may not be a complete list of foods and beverages you can eat. Contact a dietitian for more information. What foods should I avoid? Fruits Fruits canned with  syrup. Vegetables Canned vegetables. Frozen vegetables with butter or cream sauce. Grains Refined white flour and flour products such as bread, pasta, snack foods, and cereals. Avoid all processed foods. Meats and other proteins Fatty cuts of meat. Poultry with skin. Breaded or fried meats. Processed meat. Avoid saturated fats. Dairy Full-fat yogurt, cheese, or milk. Beverages Sweetened drinks, such as soda or iced tea. The items listed above may not be a complete list of foods and beverages you should avoid. Contact a dietitian for more information. Questions to ask a health care provider  Do I need to meet with a diabetes educator?  Do I need to meet with a dietitian?  What number can I call if I have questions?  When are the best times to check my blood glucose? Where to find more information:  American Diabetes Association: diabetes.org  Academy of Nutrition and Dietetics: www.eatright.[employer] of Diabetes and Digestive and Kidney Diseases: [employer]  Association of Diabetes Care and Education Specialists: www.diabeteseducator.org Summary  It is important to have healthy eating  habits because your blood sugar (glucose) levels are greatly affected by what you eat and drink.  A healthy meal plan will help you control your blood glucose and maintain a healthy lifestyle.  Your health care provider may recommend that you work with a dietitian to make a meal plan that is best for you.  Keep in mind that carbohydrates (carbs) and alcohol have immediate effects on your blood glucose levels. It is important to count carbs and to use alcohol carefully. This information is not intended to replace advice given to you by your health care provider. Make sure you discuss any questions you have with your health care provider. Document Revised: 02/10/2019 Document Reviewed: 02/10/2019 Elsevier Patient Education  2021 [employer]. Low-Sodium Eating Plan Sodium,  which is an element that makes up salt, helps you maintain a healthy balance of fluids in your body. Too much sodium can increase your blood pressure and cause fluid and waste to be held in your body. Your health care provider or dietitian may recommend following this plan if you have high blood pressure (hypertension), kidney disease, liver disease, or heart failure. Eating less sodium can help lower your blood pressure, reduce swelling, and protect your heart, liver, and kidneys. What are tips for following this plan? Reading food labels  The Nutrition Facts label lists the amount of sodium in one serving of the food. If you eat more than one serving, you must multiply the listed amount of sodium by the number of servings.  Choose foods with less than 140 mg of sodium per serving.  Avoid foods with 300 mg of sodium or more per serving. Shopping  Look for lower-sodium products, often labeled as "low-sodium" or "no salt added."  Always check the sodium content, even if foods are labeled as "unsalted" or "no salt added."  Buy fresh foods. ? Avoid canned foods and pre-made or frozen meals. ? Avoid canned, cured, or processed meats.  Buy breads that have less than 80 mg of sodium per slice.   Cooking  Eat more home-cooked food and less restaurant, buffet, and fast food.  Avoid adding salt when cooking. Use salt-free seasonings or herbs instead of table salt or sea salt. Check with your health care provider or pharmacist before using salt substitutes.  Cook with plant-based oils, such as canola, sunflower, or olive oil.   Meal planning  When eating at a restaurant, ask that your food be prepared with less salt or no salt, if possible. Avoid dishes labeled as brined, pickled, cured, smoked, or made with soy sauce, miso, or teriyaki sauce.  Avoid foods that contain MSG (monosodium glutamate). MSG is sometimes added to [place] food, bouillon, and some canned foods.  Make meals that can be  grilled, baked, poached, roasted, or steamed. These are generally made with less sodium. General information Most people on this plan should limit their sodium intake to 1,500-2,000 mg (milligrams) of sodium each day. What foods should I eat? Fruits Fresh, frozen, or canned fruit. Fruit juice. Vegetables Fresh or frozen vegetables. "No salt added" canned vegetables. "No salt added" tomato sauce and paste. Low-sodium or reduced-sodium tomato and vegetable juice. Grains Low-sodium cereals, including oats, puffed wheat and rice, and shredded wheat. Low-sodium crackers. Unsalted rice. Unsalted pasta. Low-sodium bread. Whole-grain breads and whole-grain pasta. Meats and other proteins Fresh or frozen (no salt added) meat, poultry, seafood, and fish. Low-sodium canned tuna and salmon. Unsalted nuts. Dried peas, beans, and lentils without added salt. Unsalted canned beans. Eggs.  Unsalted nut butters. Dairy Milk. Soy milk. Cheese that is naturally low in sodium, such as ricotta cheese, fresh mozzarella, or Swiss cheese. Low-sodium or reduced-sodium cheese. Cream cheese. Yogurt. Seasonings and condiments Fresh and dried herbs and spices. Salt-free seasonings. Low-sodium mustard and ketchup. Sodium-free salad dressing. Sodium-free light mayonnaise. Fresh or refrigerated horseradish. Lemon juice. Vinegar. Other foods Homemade, reduced-sodium, or low-sodium soups. Unsalted popcorn and pretzels. Low-salt or salt-free chips. The items listed above may not be a complete list of foods and beverages you can eat. Contact a dietitian for more information. What foods should I avoid? Vegetables Sauerkraut, pickled vegetables, and relishes. Olives. [place] fries. Onion rings. Regular canned vegetables (not low-sodium or reduced-sodium). Regular canned tomato sauce and paste (not low-sodium or reduced-sodium). Regular tomato and vegetable juice (not low-sodium or reduced-sodium). Frozen vegetables in  sauces. Grains Instant hot cereals. Bread stuffing, pancake, and biscuit mixes. Croutons. Seasoned rice or pasta mixes. Noodle soup cups. Boxed or frozen macaroni and cheese. Regular salted crackers. Self-rising flour. Meats and other proteins Meat or fish that is salted, canned, smoked, spiced, or pickled. Precooked or cured meat, such as sausages or meat loaves. Berniece Salines. Ham. Pepperoni. Hot dogs. Corned beef. Chipped beef. Salt pork. Jerky. Pickled herring. Anchovies and sardines. Regular canned tuna. Salted nuts. Dairy Processed cheese and cheese spreads. Hard cheeses. Cheese curds. Blue cheese. Feta cheese. String cheese. Regular cottage cheese. Buttermilk. Canned milk. Fats and oils Salted butter. Regular margarine. Ghee. Bacon fat. Seasonings and condiments Onion salt, garlic salt, seasoned salt, table salt, and sea salt. Canned and packaged gravies. Worcestershire sauce. Tartar sauce. Barbecue sauce. Teriyaki sauce. Soy sauce, including reduced-sodium. Steak sauce. Fish sauce. Oyster sauce. Cocktail sauce. Horseradish that you find on the shelf. Regular ketchup and mustard. Meat flavorings and tenderizers. Bouillon cubes. Hot sauce. Pre-made or packaged marinades. Pre-made or packaged taco seasonings. Relishes. Regular salad dressings. Salsa. Other foods Salted popcorn and pretzels. Corn chips and puffs. Potato and tortilla chips. Canned or dried soups. Pizza. Frozen entrees and pot pies. The items listed above may not be a complete list of foods and beverages you should avoid. Contact a dietitian for more information. Summary  Eating less sodium can help lower your blood pressure, reduce swelling, and protect your heart, liver, and kidneys.  Most people on this plan should limit their sodium intake to 1,500-2,000 mg (milligrams) of sodium each day.  Canned, boxed, and frozen foods are high in sodium. Restaurant foods, fast foods, and pizza are also very high in sodium. You also get sodium  by adding salt to food.  Try to cook at home, eat more fresh fruits and vegetables, and eat less fast food and canned, processed, or prepared foods. This information is not intended to replace advice given to you by your health care provider. Make sure you discuss any questions you have with your health care provider. Document Revised: 04/10/2019 Document Reviewed: 02/04/2019 Elsevier Patient Education  2021 [employer].

## 2020-08-03 NOTE — Progress Notes (Deleted)
Cephas Darby, MD [address]  [place]  [place], [place] [address]  Main: [phone]  Fax: [phone]    Gastroenterology Consultation  Referring Provider:     [name], NP Primary Care Physician:  [name], NP Primary Gastroenterologist:  Dr. Cephas Darby Reason for Consultation:     Right upper quadrant pain        HPI:   Alex Budney. is a 60 y.o. y/o male referred for consultation & management  by Dr. [name], NP. He has been dealing with 1 month history of severe right upper quadrant pain, 10 out of 10 in severity, constant, worse with eating, bending over, changing position. Muscle relaxant provides some relief. Pain radiates to center of the back. It is not associated with nausea, vomiting, altered bowel habits. He does not have fever, chills, rash, lesions. He denies direct trauma to that site, lifting heavy weights. He has been dealing with chronic low back pain issues which restricts his mobility. Ultrasound abdomen showed gallbladder polyps but no gallstones, fatty liver and fatty pancreas. His wife reports that he drinks a lot of sodas, consumes red meat daily. He is morbidly obese and does not exercise. He reports having several colonoscopies in the past at [facility]. He had EGD in 1998 and it was unremarkable. He had CT abdomen and pelvis in 2014 and it was fairly unremarkable except fatty liver. His LFTs have been mildly elevated.   First noted abnormality in LFT's in 2016 .  Alcohol use: None  Drug use: None Over the counter herbal supplements: None Acetaminophen/NSAIDs: None New medications: None Tattoos: None Military service: None Prior blood transfusion: None Incarceration: None History of travel: None Family history of liver disease/HCC: None Recent change in weight: weight gain  - Normal duodenal bulb and second portion of the duodenum. - Erythematous mucosa in the gastric body. - Multiple gastric polyps.  Biopsied. - A small amount of food (residue) in the stomach. - Esophagogastric landmarks identified. - Normal upper third of esophagus, middle third of esophagus, lower third of esophagus and gastroesophageal Junction.  Past Medical History:  Diagnosis Date  . Arrhythmia   . Asthma   . Cyst of kidney, acquired   . Diabetes mellitus without complication ([place]) 8338   type 2  . GERD (gastroesophageal reflux disease)   . History of chicken pox   . History of measles as a child   . History of PKU   . Hyperlipidemia   . Hypertension   . IBS (irritable bowel syndrome)   . Irregular heart beat   . Mentally challenged   . Pancreatitis   . Stroke [facility]) 03/02/2019    Past Surgical History:  Procedure Laterality Date  . Cardiac Catherization     [facility]  . CARDIAC CATHETERIZATION     [facility]  . COLONOSCOPY    . ESOPHAGOGASTRODUODENOSCOPY (EGD) WITH PROPOFOL N/A 12/27/2016   Procedure: ESOPHAGOGASTRODUODENOSCOPY (EGD) WITH PROPOFOL;  Surgeon: Lin Landsman, MD;  Location: [place];  Service: Gastroenterology;  Laterality: N/A;  . HEMORRHOID SURGERY    . Ligament Removal Left    of left thumb: dr. Cleda Mccreedy  . ligament removal  of left thumb     Dr. Cleda Mccreedy  . NM GATED MYOCARDIAL STUDY (ARMX HX)  06/23/2014   [place]. Normal    Prior to Admission medications   Medication Sig Start Date End Date Taking? Authorizing Provider  Cholecalciferol (VITAMIN D3) 50000 units CAPS Take  50,000 Units by mouth every 30 (thirty) days. 08/22/15  Yes Birdie Sons, MD  FLOVENT DISKUS 100 MCG/BLIST AEPB inhale 1 puff by mouth twice a day Rinse mouth after use 03/21/16  Yes Fisher, Kirstie Peri, MD  glipiZIDE (GLUCOTROL XL) 2.5 MG 24 hr tablet Take 2.5 mg by mouth daily. 04/16/16  Yes [provider]  glucose blood (ONE TOUCH ULTRA TEST) test strip 1 each by Other route daily. for testing 10/12/16  Yes Birdie Sons, MD  lisinopril (PRINIVIL,ZESTRIL) 20 MG tablet take 1 tablet by mouth once  daily 09/24/16  Yes Birdie Sons, MD  metFORMIN (GLUCOPHAGE-XR) 500 MG 24 hr tablet take 2 tablets by mouth twice a day before meals 09/24/16  Yes Birdie Sons, MD  montelukast (SINGULAIR) 10 MG tablet Take 1 tablet (10 mg total) by mouth at bedtime. 10/11/15  Yes Birdie Sons, MD  naproxen (NAPROSYN) 500 MG tablet Take 1 tablet (500 mg total) by mouth 2 (two) times daily with a meal. 08/23/16  Yes Birdie Sons, MD  omeprazole (PRILOSEC) 20 MG capsule take 1 capsule by mouth once daily 06/13/16  Yes Fisher, Kirstie Peri, MD  [facility] DELICA LANCETS 63J MISC TEST once daily 09/24/16  Yes Birdie Sons, MD  [facility] DELICA LANCETS FINE MISC TEST once daily 04/23/16  Yes Birdie Sons, MD  PARoxetine (PAXIL) 20 MG tablet Take 1 tablet (20 mg total) by mouth daily. 05/21/16  Yes Birdie Sons, MD  PROAIR HFA 108 [phone] Base) MCG/ACT inhaler inhale 2 puffs by mouth every 6 hours if needed for wheezing 10/17/16  Yes Birdie Sons, MD  Propylene Glycol (SYSTANE BALANCE) 0.6 % SOLN Apply to eye.   Yes [provider]  simvastatin (ZOCOR) 20 MG tablet take 1 tablet by mouth once daily 03/09/16  Yes Fisher, Kirstie Peri, MD  tiZANidine (ZANAFLEX) 4 MG tablet take 1 tablet by mouth every 6 hours if needed for headache 10/08/16  Yes Birdie Sons, MD  Vitamin D, Ergocalciferol, (DRISDOL) 50000 units CAPS capsule Take 1 capsule (50,000 Units total) by mouth every 7 (seven) days. 05/16/16  Yes Birdie Sons, MD  famotidine (PEPCID) 20 MG tablet Take 1 tablet (20 mg total) by mouth 2 (two) times daily. Patient not taking: Reported on 10/31/2016 11/28/15   Carrie Mew, MD  HAVRIX 1440 EL U/ML injection  09/26/16   [provider]  ondansetron (ZOFRAN ODT) 4 MG disintegrating tablet Take 1 tablet (4 mg total) by mouth every 8 (eight) hours as needed for nausea or vomiting. Patient not taking: Reported on 10/31/2016 11/28/15   Carrie Mew, MD  triamcinolone cream (KENALOG) 0.5 % apply  to affected area twice a day Patient not taking: Reported on 10/31/2016 12/05/14   Birdie Sons, MD    Family History  Problem Relation Age of Onset  . Cancer Mother        throat  . Diabetes Father   . Heart disease Father   . Cancer Maternal Grandmother   . Cancer Maternal Grandfather        pancreatic  . Cancer Paternal Grandfather      Social History   Tobacco Use  . Smoking status: Never Smoker  . Smokeless tobacco: Never Used  Vaping Use  . Vaping Use: Never used  Substance Use Topics  . Alcohol use: No    Alcohol/week: 0.0 standard drinks  . Drug use: No    Allergies as of 08/03/2020 -  Review Complete 08/03/2020  Allergen Reaction Noted  . Bee venom Hives 10/22/2014  . Other  10/09/2014  . Sulfa antibiotics  01/04/2014    Review of Systems:    All systems reviewed and negative except where noted in HPI.   Physical Exam:  BP 128/74 (BP Location: Left Arm, Patient Position: Sitting, Cuff Size: Normal)   Pulse (!) 58   Temp 98 F (36.7 C) (Oral)   Ht 5' 1.77" (1.569 m)   Wt 177 lb 4 oz (80.4 kg)   BMI 32.66 kg/m  No LMP for male patient. Psych:  Alert and cooperative. Normal mood and affect. General:   Alert,  Well-developed, well-nourished, pleasant and cooperative in NAD Head:  Normocephalic and atraumatic. Eyes:  Sclera clear, no icterus.   Conjunctiva pink. Ears:  Normal auditory acuity. Nose:  No deformity, discharge, or lesions. Mouth:  No deformity or lesions,oropharynx pink & moist. Neck:  Supple; no masses or thyromegaly. Lungs:  Respirations even and unlabored.  Clear throughout to auscultation.   No wheezes, crackles, or rhonchi. No acute distress. Heart:  Regular rate and rhythm; no murmurs, clicks, rubs, or gallops. Abdomen:  Normal bowel sounds.  No bruits.  Soft, tenderness in right upper quadrant, superficial tenderness present and non-distended without masses, hepatosplenomegaly or hernias noted.  No guarding or rebound tenderness.     Msk:  Symmetrical without gross deformities. Limping gait, right leg due to pain in RUQ & strength bilaterally. Pulses:  Normal pulses noted. Extremities:  No clubbing or edema.  No cyanosis. Neurologic:  Alert and oriented x3;  grossly normal neurologically. Skin:  Intact without significant lesions or rashes. No jaundice. Lymph Nodes:  No significant cervical adenopathy. Psych:  Alert and cooperative. Normal mood and affect.  Imaging Studies: Reviewed  Assessment and Plan:   [name]. is a 60 y.o. y/o male has been referred for Right upper quadrant pain and elevated LFTs.  1. Right upper quadrant pain: Most likely musculoskeletal versus intra-abdominal versus hepatobiliary versus gastric versus pancreatic. Gallbladder polyps are less likely to cause right upper quadrant pain. As he is insisting on surgery for gallbladder removal, I would like to perform upper endoscopy first to rule out any gastric cause. I encouraged him to undergo physical therapy and see if it helps. If everything else is negative, I will refer him to Gen. surgery to evaluate for cholecystectomy. Sometimes fatty liver with hepatomegaly may cause right upper quadrant pain from stretching of the liver capsule.  2. Elevated LFTs: No evidence of chronic liver disease, however given metabolic syndrome and chronicity of fatty liver and elevated LFTs, I will order ultrasound elastography to evaluate for any underlying fibrosis.  3. Cologuard test normal on 05/29/2016  Follow up in 3 months   Cephas Darby, MD

## 2020-08-04 ENCOUNTER — Telehealth: Payer: Self-pay

## 2020-08-04 NOTE — Telephone Encounter (Signed)
Dr. Ned Card office states to stop the Plavix 7 days prior to procedure and restart it 3 days after his procedure. Called patient and patient wife answered she verbalized understanding of instructions.

## 2020-08-05 ENCOUNTER — Other Ambulatory Visit: Payer: Self-pay | Admitting: Nurse Practitioner

## 2020-08-05 ENCOUNTER — Encounter: Payer: Self-pay | Admitting: *Deleted

## 2020-08-05 NOTE — Telephone Encounter (Signed)
Routing to provider ]

## 2020-08-05 NOTE — Patient Instructions (Addendum)
Visit Information  It was a pleasure speaking with you today. Thank you for letting me be part of your clinical team. Please call with any questions or concerns.   Goals Addressed              This Visit's Progress   .  COMPLETED: Pharmacy Care Plan        CARE PLAN ENTRY (see longitudinal plan of care for additional care plan information)  Current Barriers:  . Chronic Disease Management support, education, and care coordination needs related to Hypertension, Hyperlipidemia, Diabetes, Coronary Artery Disease, GERD, COPD, and Allergic Rhinitis   Hypertension BP Readings from Last 3 Encounters:  02/24/20 114/69  12/30/19 112/71  09/30/19 118/79   . Pharmacist Clinical Goal(s): o Over the next 60 days, patient will work with PharmD and providers to maintain BP goal <130/80 . Current regimen:  o Lisinopril 20 mg qd . Interventions:  Reviewed proper BP technique including sitting down for at least 5 minutes before, resting calmly, with your feet flat on the floor.   o Provided diet and exercise counseling. . Patient self care activities - Over the next60 days, patient will: o Check BP daily, document, and provide at future appointments o Ensure daily salt intake < 2300 mg/day   Diabetes Lab Results  Component Value Date/Time   HGBA1C 9.4 (H) 02/24/2020 04:21 PM   HGBA1C 9.4 (H) 12/30/2019 02:35 PM   . Pharmacist Clinical Goal(s): o Over the next 60 days, patient will work with PharmD and providers to achieve A1c goal <6.5% . Current regimen:  o Tresiba 40 units daily o Jardiance 25 mg daily . Interventions: o Provided diet and exercise counseling. o Reviewed goal glucose readings for an A1c of <7%, we want to see fasting sugars <130 and 2 hour after meal sugars <180.  o Reviewed last PCP note with instruction to increase Tresiba dose. . Patient self care activities - Over the next 60 days, patient will: o Check blood sugar twice daily, document, and provide at future  appointments o Contact provider with any episodes of hypoglycemia o Focus on DASH diet and increasing exercise o Atttend all scheduled appointments   Medication management . Pharmacist Clinical Goal(s): o Over the next 60 days, patient will work with PharmD and providers to achieve optimal medication adherence . Current pharmacy: [facility]  . Interventions o Comprehensive medication review performed. o Continue current medication management strategy . Patient self care activities - Over the next 60 days, patient will: o Focus on medication adherence by fill dates. o Take medications as prescribed o Report any questions or concerns to PharmD and/or provider(s)  Please see past updates related to this goal by clicking on the "Past Updates" button in the selected goal      .  COMPLETED: PharmD "I want to work on my diabetes" (pt-stated)        Cope (see longtitudinal plan of care for additional care plan information)  Current Barriers:  . 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 He fell a few a days ago while mowing the grass because he got lightheaded. He saw Dr. Melrose Nakayama in August. Restart Plavix, continue aspirin, refer to dermatology for cyst on left knee. . Current antihyperglycemic regimen: Jardiance 25 mg daily, Tresiba 26 units daily  o Metformin d/c d/t bump in Scr after restarted o Avoiding  sulfonylurea d/t sulfa allergy; avoiding GLP1 d/t hx pancreatitis- though therapy could be considered in the future if TG is well controlled, therefore reducing pancreatitis risk . Current dietary patterns: o Using air fryer o Eating more vegetables, fruits lately o Drinking water w/ flavoring packets instead of sodas . Current blood glucose readings: o Wife reports he has been documenting readings, but she doesn't have his book  accessiblle  Fastings mostly in 100s  Reports BG was 500 last night around 7 pm. Per wife he was snacking on chips and cookies after they had dinner at 6 PM. Reports he didn't want to go the ED so he drank a lot water and rechecked in three hours and it had gone down to 200. o Wife reports he lost the top to the lancet device so they have just been using the lancet. . Cardiovascular risk reduction (follows w/ Dr. Laurelyn Sickle);  o Current hypertensive regimen: lisinopril 10 mg daily  o Current hyperlipidemia regimen: atorvastatin 80 mg; fenofibrate 54 mg daily, ezetimibe 10 mg daily; Last LDL 79, goal < 70, TG slightly elevated  o Current antiplatelet regimen: ASA 81 mg daily;clopidogrel 75 mg daily . Chronic kidney disease: follows w/ [place],  . S/p CVA: follows w/ Dr. Melrose Nakayama. Last appt 11/09/19 -restarted clopidogel 75 mg daily . Allergies/asthma: albuterol PRN wheezing/SOB; Breo 100/25 mcg started at last visit; loratidine 10 mg daily, montelukast 10 mg daily . Health maintenance: Vitamin D 50,000 IU weekly Q Monday  Pharmacist Clinical Goal(s):  [name] Over the next 90 days, patient with work with PharmD and primary care provider to address optimized medication management  Interventions: . Comprehensive medication review performed, medication list updated in electronic medical record . Inter-disciplinary care team collaboration (see longitudinal plan of care) . Reviewed goal A1c, goal fasting glucose, and goal 2 hour post prandial glucose. Praised wife for her support of Cornelis and improved dietary choices, improvement in blood sugars. Reminded her to bring Alvaro's blood sugar readings to appointments, or send with Iziah if she is unable to make the appointment.  . Reviewed appropriate tx for symptomatic hypoglycemia (ie, small amount of sugar)  Patient Self Care Activities:  . Patient will check blood glucose daily , document, and provide at future appointments . Patient will take medications as  prescribed . Patient will report any questions or concerns to provider   Please see past updates related to this goal by clicking on the "Past Updates" button in the selected goal      .  PharmD- Improve My Heart Health-Coronary Artery Disease   Not on track     Timeframe:  Long-Range Goal Priority:  High Start Date:                             Expected End Date:                       Follow Up Date 2 month follow up    - be open to making changes - I can manage, know and watch for signs of a heart attack - if I have chest pain, call for help - learn about small changes that will make a big difference    Why is this important?    Lifestyle changes are key to improving the blood flow to your heart. Think about the things you can change and set a goal to live healthy.   Remember, when  the blood vessels to your heart start to get clogged you may not have any symptoms.   Over time, they can get worse.   Don't ignore the signs, like chest pain, and get help right away.     Notes:     .  PharmD-Track and Manage My Blood Pressure-Hypertension   Not on track     Timeframe:  Long-Range Goal Priority:  High Start Date:                             Expected End Date:                       Follow Up Date 2 month follow up    - check blood pressure daily - write blood pressure results in a log or diary    Why is this important?    You won't feel high blood pressure, but it can still hurt your blood vessels.   High blood pressure can cause heart or kidney problems. It can also cause a stroke.   Making lifestyle changes like losing a little weight or eating less salt will help.   Checking your blood pressure at home and at different times of the day can help to control blood pressure.   If the doctor prescribes medicine remember to take it the way the doctor ordered.   Call the office if you cannot afford the medicine or if there are questions about it.     Notes:         The patient verbalized understanding of instructions, educational materials, and care plan provided today and agreed to receive a mailed copy of patient instructions, educational materials, and care plan.   The pharmacy team will reach out to the patient again over the next 30  days.   Junita Push. [place] PharmD, BCPS Clinical Pharmacist [phone]  How to Take Your Blood Pressure Blood pressure measures how strongly your blood is pressing against the walls of your arteries. Arteries are blood vessels that carry blood from your heart throughout your body. You can take your blood pressure at home with a machine. You may need to check your blood pressure at home:  To check if you have high blood pressure (hypertension).  To check your blood pressure over time.  To make sure your blood pressure medicine is working. Supplies needed:  Blood pressure machine, or monitor.  Dining room chair to sit in.  Table or desk.  Small notebook.  Pencil or pen. How to prepare Avoid these things for 30 minutes before checking your blood pressure:  Having drinks with caffeine in them, such as coffee or tea.  Drinking alcohol.  Eating.  Smoking.  Exercising. Do these things five minutes before checking your blood pressure:  Go to the bathroom and pee (urinate).  Sit in a dining chair. Do not sit in a soft couch or an armchair.  Be quiet. Do not talk. How to take your blood pressure Follow the instructions that came with your machine. If you have a digital blood pressure monitor, these may be the instructions: 1. Sit up straight. 2. Place your feet on the floor. Do not cross your ankles or legs. 3. Rest your left arm at the level of your heart. You may rest it on a table, desk, or chair. 4. Pull up your shirt sleeve. 5. Wrap the blood pressure cuff around the upper part of  your left arm. The cuff should be 1 inch (2.5 cm) above your elbow. It is best to wrap the cuff around bare  skin. 6. Fit the cuff snugly around your arm. You should be able to place only one finger between the cuff and your arm. 7. Place the cord so that it rests in the bend of your elbow. 8. Press the power button. 9. Sit quietly while the cuff fills with air and loses air. 10. Write down the numbers on the screen. 11. Wait 2-3 minutes and then repeat steps 1-10.   What do the numbers mean? Two numbers make up your blood pressure. The first number is called systolic pressure. The second is called diastolic pressure. An example of a blood pressure reading is "120 over 80" (or 120/80). If you are an adult and do not have a medical condition, use this guide to find out if your blood pressure is normal: Normal  First number: below 120.  Second number: below 80. Elevated  First number: 120-129.  Second number: below 80. Hypertension stage 1  First number: 130-139.  Second number: 80-89. Hypertension stage 2  First number: 140 or above.  Second number: 37 or above. Your blood pressure is above normal even if only the top or bottom number is above normal. Follow these instructions at home:  Check your blood pressure as often as your doctor tells you to.  Check your blood pressure at the same time every day.  Take your monitor to your next doctor's appointment. Your doctor will: ? Make sure you are using it correctly. ? Make sure it is working right.  Make sure you understand what your blood pressure numbers should be.  Tell your doctor if your medicine is causing side effects.  Keep all follow-up visits as told by your doctor. This is important. General tips:  You will need a blood pressure machine, or monitor. Your doctor can suggest a monitor. You can buy one at a drugstore or online. When choosing one: ? Choose one with an arm cuff. ? Choose one that wraps around your upper arm. Only one finger should fit between your arm and the cuff. ? Do not choose one that measures your  blood pressure from your wrist or finger. Where to find more information American Heart Association: www.heart.org Contact a doctor if:  Your blood pressure keeps being high. Get help right away if:  Your first blood pressure number is higher than 180.  Your second blood pressure number is higher than 120. Summary  Check your blood pressure at the same time every day.  Avoid caffeine, alcohol, smoking, and exercise for 30 minutes before checking your blood pressure.  Make sure you understand what your blood pressure numbers should be. This information is not intended to replace advice given to you by your health care provider. Make sure you discuss any questions you have with your health care provider. Document Revised: 02/27/2019 Document Reviewed: 02/27/2019 Elsevier Patient Education  2021 [employer].

## 2020-08-05 NOTE — Telephone Encounter (Signed)
Pt has apt on 08/31/2020

## 2020-08-05 NOTE — Telephone Encounter (Signed)
Requested medication (s) are due for refill today: Yes  Requested medication (s) are on the active medication list: Yes  Last refill:  07/04/20  Future visit scheduled: Yes  Notes to clinic:  Unable to refill per protocol, cannot delgate.      Requested Prescriptions  Pending Prescriptions Disp Refills   tiZANidine (ZANAFLEX) 2 MG tablet [Pharmacy Med Name: TIZANIDINE HCL 2 MG TAB] 30 tablet 1    Sig: TAKE 1 TABLET BY MOUTH EVERY 6 HOURS AS NEEDED FOR MUSCLE SPASMS      Not Delegated - Cardiovascular:  Alpha-2 Agonists - tizanidine Failed - 08/05/2020  1:33 PM      Failed - This refill cannot be delegated      Passed - Valid encounter within last 6 months    Recent Outpatient Visits           2 months ago Uncontrolled type 2 diabetes mellitus with hyperglycemia ([place])   [place], Jolene T, NP   2 months ago Syncope, unspecified syncope type   [place], Lauren A, NP   5 months ago Uncontrolled type 2 diabetes mellitus with hyperglycemia ([place])   [place], Jolene T, NP   7 months ago Hypertension associated with diabetes ([place])   [place] Cannady, Jolene T, NP   10 months ago Uncontrolled type 2 diabetes mellitus with hyperglycemia ([place])   [place], Fritch T, NP       Future Appointments             In 3 weeks Cannady, Barbaraann Faster, NP [facility]              Signed Prescriptions Disp Refills   diclofenac Sodium (VOLTAREN) 1 % GEL 150 g 3    Sig: APPLY 2 GRAM TO AFFECTED AREA 3 TIMES DAILY      Analgesics:  Topicals Passed - 08/05/2020  1:33 PM      Passed - Valid encounter within last 12 months    Recent Outpatient Visits           2 months ago Uncontrolled type 2 diabetes mellitus with hyperglycemia ([place])   [place], Jolene T, NP   2 months ago Syncope, unspecified syncope type   [place], Lauren A, NP   5 months ago Uncontrolled type 2 diabetes mellitus with hyperglycemia ([place])   [place], Jolene T, NP   7 months ago Hypertension associated with diabetes ([place])   [place] Cannady, Jolene T, NP   10 months ago Uncontrolled type 2 diabetes mellitus with hyperglycemia ([place])   [place], Barbaraann Faster, NP       Future Appointments             In 3 weeks Cannady, Barbaraann Faster, NP [facility]               gabapentin (NEURONTIN) 300 MG capsule 180 capsule 1    Sig: TAKE 1 CAPSULE BY MOUTH TWICE DAILY      Neurology: Anticonvulsants - gabapentin Passed - 08/05/2020  1:33 PM      Passed - Valid encounter within last 12 months    Recent Outpatient Visits           2 months ago Uncontrolled type 2 diabetes mellitus with hyperglycemia ([place])   [place], Henrine Screws T, NP   2 months  ago Syncope, unspecified syncope type   [facility], Lauren A, NP   5 months ago Uncontrolled type 2 diabetes mellitus with hyperglycemia ([place])   [place], Jolene T, NP   7 months ago Hypertension associated with diabetes ([place])   [place] Cannady, Jolene T, NP   10 months ago Uncontrolled type 2 diabetes mellitus with hyperglycemia ([place])   [place], Barbaraann Faster, NP       Future Appointments             In 3 weeks Cannady, Barbaraann Faster, NP [facility]              Refused Prescriptions Disp Refills   fenofibrate 54 MG tablet [Pharmacy Med Name: FENOFIBRATE 54 MG TAB] 90 tablet 3    Sig: TAKE 1 TABLET BY MOUTH ONCE DAILY      Cardiovascular:  Antilipid - Fibric Acid Derivatives Failed - 08/05/2020  1:33 PM      Failed - HDL in normal range and within 360 days    HDL  Date Value Ref Range Status  06/01/2020 27 (L) >39 mg/dL Final          Failed - Triglycerides in normal range and  within 360 days    Triglycerides  Date Value Ref Range Status  06/01/2020 694 (HH) 0 - 149 mg/dL Final   Triglycerides Piccolo,Waived  Date Value Ref Range Status  12/02/2018 145 <150 mg/dL Final    Comment:                            Normal                   <150                         Borderline High     150 - 199                         High                200 - 499                         Very High                >499           Passed - Total Cholesterol in normal range and within 360 days    Cholesterol, Total  Date Value Ref Range Status  06/01/2020 207 (H) 100 - 199 mg/dL Final   Cholesterol Piccolo, Waived  Date Value Ref Range Status  12/02/2018 136 <200 mg/dL Final    Comment:                            Desirable                <200                         Borderline High      200- 239                         High                     >  239           Passed - LDL in normal range and within 360 days    LDL Chol Calc (NIH)  Date Value Ref Range Status  06/01/2020 72 0 - 99 mg/dL Final          Passed - ALT in normal range and within 180 days    ALT  Date Value Ref Range Status  06/01/2020 29 0 - 44 IU/L Final          Passed - AST in normal range and within 180 days    AST  Date Value Ref Range Status  06/01/2020 24 0 - 40 IU/L Final          Passed - Cr in normal range and within 180 days    Creatinine, Ser  Date Value Ref Range Status  06/01/2020 1.03 0.76 - 1.27 mg/dL Final   Creatinine, POC  Date Value Ref Range Status  08/22/2015 n/a mg/dL Final          Passed - eGFR in normal range and within 180 days    GFR calc Af Amer  Date Value Ref Range Status  02/24/2020 76 >59 mL/min/1.73 Final    Comment:    **In accordance with recommendations from the NKF-ASN Task force,**   [facility] is in the process of updating its eGFR calculation to the   2021 CKD-EPI creatinine equation that estimates kidney function   without a race variable.     GFR calc non Af Amer  Date Value Ref Range Status  02/24/2020 66 >59 mL/min/1.73 Final   eGFR  Date Value Ref Range Status  06/01/2020 85 >59 mL/min/1.73 Final          Passed - Valid encounter within last 12 months    Recent Outpatient Visits           2 months ago Uncontrolled type 2 diabetes mellitus with hyperglycemia ([place])   [place], Jolene T, NP   2 months ago Syncope, unspecified syncope type   [facility], Lauren A, NP   5 months ago Uncontrolled type 2 diabetes mellitus with hyperglycemia ([place])   [place], Jolene T, NP   7 months ago Hypertension associated with diabetes ([place])   [place] Cannady, Jolene T, NP   10 months ago Uncontrolled type 2 diabetes mellitus with hyperglycemia ([place])   [place], Barbaraann Faster, NP       Future Appointments             In 3 weeks Cannady, Barbaraann Faster, NP [facility]

## 2020-08-09 ENCOUNTER — Encounter: Payer: Self-pay | Admitting: Gastroenterology

## 2020-08-10 ENCOUNTER — Ambulatory Visit: Payer: Medicare Other | Admitting: Anesthesiology

## 2020-08-10 ENCOUNTER — Encounter: Payer: Self-pay | Admitting: Gastroenterology

## 2020-08-10 ENCOUNTER — Ambulatory Visit
Admission: RE | Admit: 2020-08-10 | Discharge: 2020-08-10 | Disposition: A | Discharge status: Home / Self Care | Payer: Medicare Other | Attending: Gastroenterology | Admitting: Gastroenterology

## 2020-08-10 ENCOUNTER — Encounter
Admission: RE | Disposition: A | Discharge status: Home / Self Care | Payer: Self-pay | Source: Home / Self Care | Attending: Gastroenterology

## 2020-08-10 DIAGNOSIS — Z8619 Personal history of other infectious and parasitic diseases: Secondary | ICD-10-CM | POA: Diagnosis not present

## 2020-08-10 DIAGNOSIS — Z596 Low income: Secondary | ICD-10-CM | POA: Diagnosis not present

## 2020-08-10 DIAGNOSIS — Z7982 Long term (current) use of aspirin: Secondary | ICD-10-CM | POA: Insufficient documentation

## 2020-08-10 DIAGNOSIS — K760 Fatty (change of) liver, not elsewhere classified: Secondary | ICD-10-CM | POA: Diagnosis not present

## 2020-08-10 DIAGNOSIS — Z5941 Food insecurity: Secondary | ICD-10-CM | POA: Insufficient documentation

## 2020-08-10 DIAGNOSIS — Z7951 Long term (current) use of inhaled steroids: Secondary | ICD-10-CM | POA: Insufficient documentation

## 2020-08-10 DIAGNOSIS — Z8673 Personal history of transient ischemic attack (TIA), and cerebral infarction without residual deficits: Secondary | ICD-10-CM | POA: Insufficient documentation

## 2020-08-10 DIAGNOSIS — R195 Other fecal abnormalities: Secondary | ICD-10-CM | POA: Insufficient documentation

## 2020-08-10 DIAGNOSIS — Z833 Family history of diabetes mellitus: Secondary | ICD-10-CM | POA: Insufficient documentation

## 2020-08-10 DIAGNOSIS — E785 Hyperlipidemia, unspecified: Secondary | ICD-10-CM | POA: Insufficient documentation

## 2020-08-10 DIAGNOSIS — Z79899 Other long term (current) drug therapy: Secondary | ICD-10-CM | POA: Diagnosis not present

## 2020-08-10 DIAGNOSIS — I10 Essential (primary) hypertension: Secondary | ICD-10-CM | POA: Diagnosis not present

## 2020-08-10 DIAGNOSIS — Z8249 Family history of ischemic heart disease and other diseases of the circulatory system: Secondary | ICD-10-CM | POA: Insufficient documentation

## 2020-08-10 DIAGNOSIS — K635 Polyp of colon: Secondary | ICD-10-CM | POA: Insufficient documentation

## 2020-08-10 DIAGNOSIS — D126 Benign neoplasm of colon, unspecified: Secondary | ICD-10-CM | POA: Diagnosis not present

## 2020-08-10 DIAGNOSIS — Z882 Allergy status to sulfonamides status: Secondary | ICD-10-CM | POA: Diagnosis not present

## 2020-08-10 DIAGNOSIS — K644 Residual hemorrhoidal skin tags: Secondary | ICD-10-CM | POA: Diagnosis not present

## 2020-08-10 DIAGNOSIS — E1129 Type 2 diabetes mellitus with other diabetic kidney complication: Secondary | ICD-10-CM

## 2020-08-10 DIAGNOSIS — K219 Gastro-esophageal reflux disease without esophagitis: Secondary | ICD-10-CM | POA: Diagnosis not present

## 2020-08-10 DIAGNOSIS — K649 Unspecified hemorrhoids: Secondary | ICD-10-CM | POA: Diagnosis not present

## 2020-08-10 DIAGNOSIS — E119 Type 2 diabetes mellitus without complications: Secondary | ICD-10-CM | POA: Diagnosis not present

## 2020-08-10 HISTORY — PX: COLONOSCOPY WITH PROPOFOL: SHX5780

## 2020-08-10 HISTORY — DX: Fatty (change of) liver, not elsewhere classified: K76.0

## 2020-08-10 LAB — GLUCOSE, CAPILLARY: Glucose-Capillary: 124 mg/dL — ABNORMAL HIGH (ref 70–99)

## 2020-08-10 SURGERY — COLONOSCOPY WITH PROPOFOL
Anesthesia: General

## 2020-08-10 MED ORDER — SODIUM CHLORIDE 0.9 % IV SOLN
INTRAVENOUS | Status: DC
Start: 2020-08-10 — End: 2020-08-10

## 2020-08-10 MED ORDER — PROPOFOL 500 MG/50ML IV EMUL
INTRAVENOUS | Status: DC | PRN
  Administered 2020-08-10: 100 ug/kg/min via INTRAVENOUS

## 2020-08-10 MED ORDER — LIDOCAINE HCL (CARDIAC) PF 100 MG/5ML IV SOSY
PREFILLED_SYRINGE | INTRAVENOUS | Status: DC | PRN
  Administered 2020-08-10: 80 mg via INTRAVENOUS

## 2020-08-10 MED ORDER — PROPOFOL 10 MG/ML IV BOLUS
INTRAVENOUS | Status: DC | PRN
  Administered 2020-08-10: 80 mg via INTRAVENOUS
  Administered 2020-08-10: 30 mg via INTRAVENOUS

## 2020-08-10 NOTE — Anesthesia Postprocedure Evaluation (Signed)
Anesthesia Post Note  Patient: [name].  Procedure(s) Performed: COLONOSCOPY WITH PROPOFOL (N/A )  Patient location during evaluation: Endoscopy Anesthesia Type: General Level of consciousness: awake and alert and oriented Pain management: pain level controlled Vital Signs Assessment: post-procedure vital signs reviewed and stable Respiratory status: spontaneous breathing Cardiovascular status: blood pressure returned to baseline Anesthetic complications: no   No complications documented.   Last Vitals:  Vitals:   08/10/20 1224 08/10/20 1234  BP: 111/82 126/62  Pulse:    Resp:    Temp:    SpO2:      Last Pain:  Vitals:   08/10/20 1234  TempSrc:   PainSc: 0-No pain                 Caresse Sedivy

## 2020-08-10 NOTE — Anesthesia Preprocedure Evaluation (Signed)
Anesthesia Evaluation  Patient identified by MRN, date of birth, ID band Patient awake    Reviewed: Allergy & Precautions, H&P , NPO status , Patient's Chart, lab work & pertinent test results, reviewed documented beta blocker date and time   Airway Mallampati: II   Neck ROM: full    Dental  (+) Poor Dentition   Pulmonary neg pulmonary ROS, asthma , COPD,    Pulmonary exam normal        Cardiovascular Exercise Tolerance: Good hypertension, On Medications + Peripheral Vascular Disease  negative cardio ROS Normal cardiovascular exam Rhythm:regular Rate:Normal     Neuro/Psych  Headaches,  Neuromuscular disease negative neurological ROS  negative psych ROS   GI/Hepatic negative GI ROS, Neg liver ROS, GERD  Medicated,  Endo/Other  negative endocrine ROSdiabetes  Renal/GU Renal diseasenegative Renal ROS  negative genitourinary   Musculoskeletal   Abdominal   Peds  Hematology negative hematology ROS (+)   Anesthesia Other Findings Past Medical History: No date: Arrhythmia No date: Cyst of kidney, acquired No date: Diabetes mellitus without complication (HCC)     Comment:  type 2 No date: History of chicken pox No date: History of measles as a child No date: Hyperlipidemia No date: Hypertension No date: Irregular heart beat Past Surgical History: No date: Cardiac Catherization     Comment:  [facility] No date: CARDIAC CATHETERIZATION     Comment:  [facility] No date: Ligament Removal; Left     Comment:  of left thumb: dr. Cleda Mccreedy No date: ligament removal  of left thumb     Comment:  Dr. Cleda Mccreedy 06/23/2014: [place] ([facility] HX)     Comment:  [place]. Normal BMI    Body Mass Index:  31.76 kg/m     Reproductive/Obstetrics negative OB ROS                             Anesthesia Physical  Anesthesia Plan  ASA: III  Anesthesia Plan: General   Post-op Pain Management:     Induction:   PONV Risk Score and Plan:   Airway Management Planned: Nasal Cannula  Additional Equipment:   Intra-op Plan:   Post-operative Plan:   Informed Consent: I have reviewed the patients History and Physical, chart, labs and discussed the procedure including the risks, benefits and alternatives for the proposed anesthesia with the patient or authorized representative who has indicated his/her understanding and acceptance.     Dental Advisory Given  Plan Discussed with: CRNA  Anesthesia Plan Comments:         Anesthesia Quick Evaluation

## 2020-08-10 NOTE — Op Note (Signed)
[facility] Patient Name: Ko Bardon Procedure Date: 08/10/2020 11:29 AM MRN: [id number] Account #: [id number] Date of Birth: 06-30-1960 Admit Type: Outpatient Age: 60 Room: [facility] ENDO ROOM 4 Gender: Male Note Status: Finalized Procedure:             Colonoscopy Indications:           Positive Cologuard test Providers:             Lin Landsman MD, MD Medicines:             General Anesthesia Complications:         No immediate complications. Estimated blood loss: None. Procedure:             Pre-Anesthesia Assessment:                        - Prior to the procedure, a History and Physical was                         performed, and patient medications and allergies were                         reviewed. The patient is competent. The risks and                         benefits of the procedure and the sedation options and                         risks were discussed with the patient. All questions                         were answered and informed consent was obtained.                         Patient identification and proposed procedure were                         verified by the physician, the nurse, the                         anesthesiologist, the anesthetist and the technician                         in the pre-procedure area in the procedure room in the                         endoscopy suite. Mental Status Examination: alert and                         oriented. Airway Examination: normal oropharyngeal                         airway and neck mobility. Respiratory Examination:                         clear to auscultation. CV Examination: normal.                         Prophylactic Antibiotics: The patient does not require  prophylactic antibiotics. Prior Anticoagulants: The                         patient has taken Plavix (clopidogrel), last dose was                         7 days prior to procedure. ASA  Grade Assessment: III -                         A patient with severe systemic disease. After                         reviewing the risks and benefits, the patient was                         deemed in satisfactory condition to undergo the                         procedure. The anesthesia plan was to use general                         anesthesia. Immediately prior to administration of                         medications, the patient was re-assessed for adequacy                         to receive sedatives. The heart rate, respiratory                         rate, oxygen saturations, blood pressure, adequacy of                         pulmonary ventilation, and response to care were                         monitored throughout the procedure. The physical                         status of the patient was re-assessed after the                         procedure.                        After obtaining informed consent, the colonoscope was                         passed under direct vision. Throughout the procedure,                         the patient's blood pressure, pulse, and oxygen                         saturations were monitored continuously. The                         Colonoscope was introduced through the anus and  advanced to the the terminal ileum, with                         identification of the appendiceal orifice and IC                         valve. The colonoscopy was performed without                         difficulty. The patient tolerated the procedure well.                         The quality of the bowel preparation was evaluated                         using the BBPS [facility] Bowel Preparation Scale) with                         scores of: Right Colon = 3, Transverse Colon = 3 and                         Left Colon = 3 (entire mucosa seen well with no                         residual staining, small fragments of stool or opaque                          liquid). The total BBPS score equals 9. Findings:      The perianal and digital rectal examinations were normal. Pertinent       negatives include normal sphincter tone and no palpable rectal lesions.      Two sessile polyps were found in the descending colon and cecum. The       polyps were 5 mm in size. These polyps were removed with a cold snare.       Resection and retrieval were complete.      Non-bleeding external hemorrhoids were found during retroflexion. The       hemorrhoids were medium-sized. Impression:            - Two 5 mm polyps in the descending colon and in the                         cecum, removed with a cold snare. Resected and                         retrieved.                        - Non-bleeding external hemorrhoids. Recommendation:        - Discharge patient to home (with escort).                        - Resume previous diet today.                        - Continue present medications.                        - Await  pathology results.                        - Repeat colonoscopy in 7 years for surveillance based                         on pathology results. Procedure Code(s):     --- Professional ---                        [phone], Colonoscopy, flexible; with removal of                         tumor(s), polyp(s), or other lesion(s) by snare                         technique Diagnosis Code(s):     --- Professional ---                        K63.5, Polyp of colon                        K64.4, Residual hemorrhoidal skin tags                        R19.5, Other fecal abnormalities CPT copyright 2019 American Medical Association. All rights reserved. The codes documented in this report are preliminary and upon coder review may  be revised to meet current compliance requirements. Dr. Ulyess Mort Lin Landsman MD, MD 08/10/2020 12:02:41 PM This report has been signed electronically. Number of Addenda: 0 Note Initiated On: 08/10/2020 11:29 AM Scope  Withdrawal Time: 0 hours 12 minutes 12 seconds  Total Procedure Duration: 0 hours 13 minutes 42 seconds  Estimated Blood Loss:  Estimated blood loss: none.      [facility]

## 2020-08-10 NOTE — H&P (Signed)
Cephas Darby, MD [address]  [place]  [place], [place] [address]  Main: [phone]  Fax: [phone] Pager: [phone]  Primary Care Physician:  [name], NP Primary Gastroenterologist:  Dr. Cephas Darby  Pre-Procedure History & Physical: HPI:  Alex Kaps. is a 60 y.o. male is here for an colonoscopy.   Past Medical History:  Diagnosis Date  . Arrhythmia   . Asthma   . Cyst of kidney, acquired   . Diabetes mellitus without complication ([place]) 9892   type 2  . Fatty liver   . GERD (gastroesophageal reflux disease)   . History of chicken pox   . History of measles as a child   . History of PKU   . Hyperlipidemia   . Hypertension   . IBS (irritable bowel syndrome)   . Irregular heart beat   . Mentally challenged   . Pancreatitis   . Stroke [facility]) 03/02/2019    Past Surgical History:  Procedure Laterality Date  . Cardiac Catherization     [facility]  . CARDIAC CATHETERIZATION     [facility]  . COLONOSCOPY    . ESOPHAGOGASTRODUODENOSCOPY (EGD) WITH PROPOFOL N/A 12/27/2016   Procedure: ESOPHAGOGASTRODUODENOSCOPY (EGD) WITH PROPOFOL;  Surgeon: Lin Landsman, MD;  Location: [place];  Service: Gastroenterology;  Laterality: N/A;  . HEMORRHOID SURGERY    . Ligament Removal Left    of left thumb: dr. Cleda Mccreedy  . ligament removal  of left thumb     Dr. Cleda Mccreedy  . NM GATED MYOCARDIAL STUDY (ARMX HX)  06/23/2014   [place]. Normal    Prior to Admission medications   Medication Sig Start Date End Date Taking? Authorizing Provider  aspirin 81 MG EC tablet Take 1 tablet (81 mg total) by mouth daily. 03/25/19  Yes Cannady, Jolene T, NP  atorvastatin (LIPITOR) 80 MG tablet Take 1 tablet (80 mg total) by mouth daily. 09/22/19  Yes Cannady, Jolene T, NP  Blood Glucose Monitoring Suppl (ONE TOUCH ULTRA 2) w/Device KIT Use to check blood sugar 4 times a day 03/25/20  Yes Cannady, Jolene T, NP  BREO ELLIPTA 100-25 MCG/INH AEPB INHALE 1 PUFF BY MOUTH ONCE  DAILY 06/22/20  Yes Cannady, Jolene T, NP  empagliflozin (JARDIANCE) 25 MG TABS tablet Take 1 tablet (25 mg total) by mouth daily. 02/03/20  Yes Cannady, Jolene T, NP  fenofibrate 54 MG tablet Take 1 tablet (54 mg total) by mouth daily. 09/22/19  Yes Cannady, Jolene T, NP  gabapentin (NEURONTIN) 300 MG capsule TAKE 1 CAPSULE BY MOUTH TWICE DAILY 08/05/20  Yes Cannady, Jolene T, NP  lisinopril (ZESTRIL) 20 MG tablet Take 1 tablet (20 mg total) by mouth daily. Patient taking differently: Take 20 mg by mouth daily. Taking 1/2 tab daily 09/22/19  Yes Cannady, Jolene T, NP  montelukast (SINGULAIR) 10 MG tablet TAKE 1 TABLET BY MOUTH ONCE EVERY EVENING AT BEDTIME. 09/22/19  Yes Cannady, Jolene T, NP  omeprazole (PRILOSEC) 20 MG capsule Take 1 capsule (20 mg total) by mouth daily. 09/22/19  Yes Cannady, Jolene T, NP  tiZANidine (ZANAFLEX) 2 MG tablet TAKE 1 TABLET BY MOUTH EVERY 6 HOURS AS NEEDED FOR MUSCLE SPASMS 08/05/20  Yes Cannady, Jolene T, NP  TRESIBA FLEXTOUCH 100 UNIT/ML FlexTouch Pen INJECT 22 UNITS SUBQ ONCE DAILY Patient taking differently: INJECT 50 UNITS SUBQ ONCE DAILY 05/25/20  Yes Cannady, Jolene T, NP  acetaminophen (TYLENOL) 325 MG tablet Take 1-2 tablets (325-650 mg total) by mouth every 4 (  four) hours as needed for mild pain. Patient not taking: Reported on 08/10/2020 03/11/19   Angiulli, Lavon Paganini, PA-C  albuterol (PROAIR HFA) 108 (90 Base) MCG/ACT inhaler INHALE 2 PUFFS BY MOUTH EVERY 6 HOURS IF NEEDED FOR COUGH OR WHEEZING. Patient not taking: Reported on 08/10/2020 09/02/19   Marnee Guarneri T, NP  Cholecalciferol 1.25 MG (50000 UT) TABS Take 1 tablet by mouth once a week. For 8 weeks and then stop.  Return to office for lab draw. 06/02/20   Cannady, Henrine Screws T, NP  clobetasol cream (TEMOVATE) 5.46 % Apply 1 application topically 2 (two) times daily. Apply to affected area Patient not taking: Reported on 08/10/2020 03/11/19   Angiulli, Lavon Paganini, PA-C  clopidogrel (PLAVIX) 75 MG tablet Take 1 tablet by  mouth daily. 12/25/19   [provider]  Dextran 70-Hypromellose (ARTIFICIAL TEARS) 0.1-0.3 % SOLN Apply 1 drop to eye 3 (three) times daily. To right eye only. Patient not taking: Reported on 08/10/2020 03/25/19   Marnee Guarneri T, NP  diclofenac Sodium (VOLTAREN) 1 % GEL APPLY 2 GRAM TO AFFECTED AREA 3 TIMES DAILY Patient not taking: Reported on 08/10/2020 08/05/20   Marnee Guarneri T, NP  glucose blood (ONETOUCH ULTRA) test strip To check blood sugar three times daily 03/25/20   Cannady, Henrine Screws T, NP  Insulin Pen Needle (PEN NEEDLES) 32G X 4 MM MISC 1 kit by Does not apply route daily. 09/07/19   Marnee Guarneri T, NP  Lancets (ONETOUCH ULTRASOFT) lancets Use as instructed 03/25/20   [name], NP  Misc. Devices (PULSE OXIMETER FOR FINGER) MISC To check O2 saturations once daily with asthma and document + check if any shortness of breath 06/01/20   Cannady, Henrine Screws T, NP  nitroGLYCERIN (NITROSTAT) 0.4 MG SL tablet Place 1 tablet (0.4 mg total) under the tongue every 5 (five) minutes as needed for chest pain. Patient not taking: Reported on 08/10/2020 08/27/17   Birdie Sons, MD  polyethylene glycol (MIRALAX) 17 g packet Take 17 g by mouth daily. 03/06/19   Lavina Hamman, MD  trimethoprim-polymyxin b Mayra Neer) ophthalmic solution Place 1 drop into the right eye every 6 (six) hours.  07/07/19   [provider]  ULTRACARE PEN NEEDLES 32G X 5 MM MISC  05/25/20   [provider]  Vitamin D, Ergocalciferol, (DRISDOL) 1.25 MG (50000 UNIT) CAPS capsule TAKE 1 CAPSULE BY MOUTH ONCE A WEEK 10/26/19   Marnee Guarneri T, NP    Allergies as of 08/03/2020 - Review Complete 08/03/2020  Allergen Reaction Noted  . Bee venom Hives 10/22/2014  . Other  10/09/2014  . Sulfa antibiotics  01/04/2014    Family History  Problem Relation Age of Onset  . Cancer Mother        throat  . Diabetes Father   . Heart disease Father   . Cancer Maternal Grandmother   . Cancer Maternal  Grandfather        pancreatic  . Cancer Paternal Grandfather     Social History   Socioeconomic History  . Marital status: Married    Spouse name: Not on file  . Number of children: 0  . Years of education: Not on file  . Highest education level: 12th grade  Occupational History  . Occupation: Disabled  Tobacco Use  . Smoking status: Never Smoker  . Smokeless tobacco: Never Used  Vaping Use  . Vaping Use: Never used  Substance and Sexual Activity  . Alcohol use: No  Alcohol/week: 0.0 standard drinks  . Drug use: No  . Sexual activity: Not on file  Other Topics Concern  . Not on file  Social History Narrative   ** Merged History Encounter **       ** Data from: 10/22/14 Enc Dept: [facility]       ** Data from: 10/07/14 Enc Dept: [facility]   Arrest: Arrested in 2012 for indecent liberties, required to wear ankle monitor   Social Determinants of Health   Financial Resource Strain: High Risk  . Difficulty of Paying Living Expenses: Hard  Food Insecurity: Food Insecurity Present  . Worried About Charity fundraiser in the Last Year: Sometimes true  . Ran Out of Food in the Last Year: Sometimes true  Transportation Needs: Not on file  Physical Activity: Not on file  Stress: Not on file  Social Connections: Not on file  Intimate Partner Violence: Not on file    Review of Systems: See HPI, otherwise negative ROS  Physical Exam: BP 121/72   Pulse 98   Temp (!) 97 F (36.1 C) (Temporal)   Resp 18   Ht 5' 4" (1.626 m)   Wt 80.3 kg   SpO2 98%   BMI 30.38 kg/m  General:   Alert,  pleasant and cooperative in NAD Head:  Normocephalic and atraumatic. Neck:  Supple; no masses or thyromegaly. Lungs:  Clear throughout to auscultation.    Heart:  Regular rate and rhythm. Abdomen:  Soft, nontender and nondistended. Normal bowel sounds, without guarding, and without rebound.   Neurologic:  Alert and  oriented x4;  grossly normal  neurologically.  Impression/Plan: Alex Metro. is here for an colonoscopy to be performed for positive cologaurd  Risks, benefits, limitations, and alternatives regarding  colonoscopy have been reviewed with the patient.  Questions have been answered.  All parties agreeable.   Sherri Sear, MD  08/10/2020, 10:27 AM

## 2020-08-10 NOTE — Transfer of Care (Signed)
Immediate Anesthesia Transfer of Care Note  Patient: [name].  Procedure(s) Performed: COLONOSCOPY WITH PROPOFOL (N/A )  Patient Location: PACU and Endoscopy Unit  Anesthesia Type:General  Level of Consciousness: awake, drowsy and patient cooperative  Airway & Oxygen Therapy: Patient Spontanous Breathing  Post-op Assessment: Report given to RN and Post -op Vital signs reviewed and stable  Post vital signs: Reviewed and stable  Last Vitals:  Vitals Value Taken Time  BP 107/70 08/10/20 1206  Temp 36.2 C 08/10/20 1204  Pulse 50 08/10/20 1206  Resp 14 08/10/20 1206  SpO2 100 % 08/10/20 1206  Vitals shown include unvalidated device data.  Last Pain:  Vitals:   08/10/20 1204  TempSrc: Temporal  PainSc: Asleep         Complications: No complications documented.

## 2020-08-11 ENCOUNTER — Encounter: Payer: Self-pay | Admitting: Gastroenterology

## 2020-08-11 LAB — SURGICAL PATHOLOGY

## 2020-08-12 ENCOUNTER — Other Ambulatory Visit: Payer: Self-pay | Admitting: Nurse Practitioner

## 2020-08-12 NOTE — Telephone Encounter (Signed)
Future visit in 2 weeks. Please review administration orders with patient . Patient listed as taking 50 units daily SQ.

## 2020-08-16 ENCOUNTER — Telehealth: Payer: Self-pay | Admitting: Pharmacist

## 2020-08-16 ENCOUNTER — Telehealth: Payer: Self-pay | Admitting: Gastroenterology

## 2020-08-16 NOTE — Chronic Care Management (AMB) (Signed)
Chronic Care Management Pharmacy Assistant   Name: [name].  MRN: [id number] DOB: [birth year]   Reason for Encounter: Disease State Diabetes Mellitus    Recent office visits:  None noted  Recent consult visits:  08/03/2020 Sherri Sear, MD (Gastroenterology) Initial visit. Follow up prn.  Hospital visits:  None in previous 6 months  Medications: Outpatient Encounter Medications as of 08/16/2020  Medication Sig  . aspirin 81 MG EC tablet Take 1 tablet (81 mg total) by mouth daily.  [name] atorvastatin (LIPITOR) 80 MG tablet Take 1 tablet (80 mg total) by mouth daily.  . Blood Glucose Monitoring Suppl (ONE TOUCH ULTRA 2) w/Device KIT Use to check blood sugar 4 times a day  . BREO ELLIPTA 100-25 MCG/INH AEPB INHALE 1 PUFF BY MOUTH ONCE DAILY  . Cholecalciferol 1.25 MG (50000 UT) TABS Take 1 tablet by mouth once a week. For 8 weeks and then stop.  Return to office for lab draw.  . clobetasol cream (TEMOVATE) 0.63 % Apply 1 application topically 2 (two) times daily. Apply to affected area (Patient not taking: Reported on 08/10/2020)  . clopidogrel (PLAVIX) 75 MG tablet Take 1 tablet by mouth daily.  . empagliflozin (JARDIANCE) 25 MG TABS tablet Take 1 tablet (25 mg total) by mouth daily.  . fenofibrate 54 MG tablet Take 1 tablet (54 mg total) by mouth daily.  [name] gabapentin (NEURONTIN) 300 MG capsule TAKE 1 CAPSULE BY MOUTH TWICE DAILY  . glucose blood (ONETOUCH ULTRA) test strip To check blood sugar three times daily  . Insulin Pen Needle (PEN NEEDLES) 32G X 4 MM MISC 1 kit by Does not apply route daily.  . Lancets (ONETOUCH ULTRASOFT) lancets Use as instructed  . lisinopril (ZESTRIL) 20 MG tablet Take 1 tablet (20 mg total) by mouth daily. (Patient taking differently: Take 20 mg by mouth daily. Taking 1/2 tab daily)  . Misc. Devices (PULSE OXIMETER FOR FINGER) MISC To check O2 saturations once daily with asthma and document + check if any shortness of breath  . montelukast  (SINGULAIR) 10 MG tablet TAKE 1 TABLET BY MOUTH ONCE EVERY EVENING AT BEDTIME.  . nitroGLYCERIN (NITROSTAT) 0.4 MG SL tablet Place 1 tablet (0.4 mg total) under the tongue every 5 (five) minutes as needed for chest pain. (Patient not taking: Reported on 08/10/2020)  . omeprazole (PRILOSEC) 20 MG capsule Take 1 capsule (20 mg total) by mouth daily.  . polyethylene glycol (MIRALAX) 17 g packet Take 17 g by mouth daily.  [name] tiZANidine (ZANAFLEX) 2 MG tablet TAKE 1 TABLET BY MOUTH EVERY 6 HOURS AS NEEDED FOR MUSCLE SPASMS  . TRESIBA FLEXTOUCH 100 UNIT/ML FlexTouch Pen INJECT 22 UNITS SUBQ ONCE DAILY  . trimethoprim-polymyxin b (POLYTRIM) ophthalmic solution Place 1 drop into the right eye every 6 (six) hours.   [name] ULTRACARE PEN NEEDLES 32G X 5 MM MISC   . Vitamin D, Ergocalciferol, (DRISDOL) 1.25 MG (50000 UNIT) CAPS capsule TAKE 1 CAPSULE BY MOUTH ONCE A WEEK   No facility-administered encounter medications on file as of 08/16/2020.   Recent Relevant Labs: Lab Results  Component Value Date/Time   HGBA1C 13.1 (H) 06/01/2020 03:09 PM   HGBA1C 9.4 (H) 02/24/2020 04:21 PM   MICROALBUR 10 06/01/2020 03:09 PM   MICROALBUR 10 07/17/2019 10:24 AM   MICROALBUR 20 08/22/2015 05:04 PM    Kidney Function Lab Results  Component Value Date/Time   CREATININE 1.03 06/01/2020 03:16 PM   CREATININE 1.26 05/23/2020 04:15 PM  GFRNONAA 66 02/24/2020 04:22 PM   GFRAA 76 02/24/2020 04:22 PM    . Current antihyperglycemic regimen:  ? Jardiance 25 mg Take 1 tab daily ? Tresiba Inject 22units SUBQ once daily  . What recent interventions/DTPs have been made to improve glycemic control:  o None noted  . Have there been any recent hospitalizations or ED visits since last visit with CPP? No   . Patient denies hypoglycemic symptoms, including Pale, Sweaty, Shaky, Hungry, Nervous/irritable and Vision changes   . Patient reports hyperglycemic symptoms, including excessive thirst, fatigue and weakness   . How  often are you checking your blood sugar? 3-4 times daily   . What are your blood sugars ranging?  o Fasting: 500 waited 1 hour went down to 229 o Before meals:  o After meals:  o Bedtime:  . During the week, how often does your blood glucose drop below 70? Never   . Are you checking your feet daily/regularly?  Patient wife states he does check his feet daily he does get some cuts on his feet when he cuts his yard but he uses a cream to heal his cuts.  Patients wife states he fell last night when he went to use the restroom. She states her husband has some redness on his knee but he is okay & will see his PCP on 08/31/20 for it. I advised patients wife to call his PCP.  Adherence Review: Is the patient currently on a STATIN medication? Yes Is the patient currently on ACE/ARB medication? Yes Does the patient have >5 day gap between last estimated fill dates? No   Star Rating Drugs: Atorvastatin 80 mg Lats filled:08/05/2020 90 DS Jardiance 25 mg Last filled:07/20/2020 30 DS Lisinoprill 20 mg Last filled:08/05/2020 90 DS  [facility] Clinical Pharmacist Assistant [phone]

## 2020-08-16 NOTE — Telephone Encounter (Signed)
Please call patient's wife regarding results

## 2020-08-16 NOTE — Telephone Encounter (Signed)
Informed patient wife of the letter and she verbalized understanding

## 2020-08-31 ENCOUNTER — Ambulatory Visit: Payer: Medicare Other | Admitting: Nurse Practitioner

## 2020-09-02 ENCOUNTER — Ambulatory Visit: Payer: Medicare Other | Admitting: Nurse Practitioner

## 2020-09-05 ENCOUNTER — Telehealth: Payer: Self-pay | Admitting: Pharmacist

## 2020-09-05 ENCOUNTER — Telehealth: Payer: Self-pay

## 2020-09-05 NOTE — Progress Notes (Deleted)
Chronic Care Management Pharmacy Note  09/05/2020 Name:  [name]. MRN:  [id number] DOB:  [birth year]  Subjective: Alex Alfred Tommy Goostree. is an 60 y.o. year old male who is a primary patient of Cannady, Barbaraann Faster, NP.  The CCM team was consulted for assistance with disease management and care coordination needs.    Engaged with patient by telephone for follow up visit in response to provider referral for pharmacy case management and/or care coordination services.   Consent to Services:  The patient was given information about Chronic Care Management services, agreed to services, and gave verbal consent prior to initiation of services.  Please see initial visit note for detailed documentation.   Patient Care Team: [name], NP as PCP - General (Nurse Practitioner) Anell Barr, OD (Optometry) Dionisio David, MD as Consulting Physician (Cardiology) Vanita Ingles, RN as Case Manager ([place]) Vladimir Faster, [facility] as Pharmacist (Pharmacist) Rebekah Chesterfield, LCSW as Social Worker (Licensed Clinical Social Worker)  Recent office visits: 3/16/22[facility] (PCP)-bloodwork, A!C 13.1, endo referral Tresiba to 50 units, vitamin d 50,00 iu qd  Recent consult visits: 08/10/20-[place](GI)- Colonoscopy 06/04/20- [facility] hand sprain  Hospital visits: None in previous 6 months  Objective:  Lab Results  Component Value Date   CREATININE 1.03 06/01/2020   BUN 12 06/01/2020   GFRNONAA 66 02/24/2020   GFRAA 76 02/24/2020   NA 135 06/01/2020   K 4.3 06/01/2020   CALCIUM 9.4 06/01/2020   CO2 19 (L) 06/01/2020   GLUCOSE 450 (H) 06/01/2020    Lab Results  Component Value Date/Time   HGBA1C 13.1 (H) 06/01/2020 03:09 PM   HGBA1C 9.4 (H) 02/24/2020 04:21 PM   MICROALBUR 10 06/01/2020 03:09 PM   MICROALBUR 10 07/17/2019 10:24 AM   MICROALBUR 20 08/22/2015 05:04 PM    Last diabetic Eye exam:  Lab Results  Component Value Date/Time   HMDIABEYEEXA No  Retinopathy 07/01/2017 12:00 AM    Last diabetic Foot exam: No results found for: HMDIABFOOTEX   Lab Results  Component Value Date   CHOL 207 (H) 06/01/2020   HDL 27 (L) 06/01/2020   LDLCALC 72 06/01/2020   TRIG 694 (HH) 06/01/2020   CHOLHDL 5.3 03/03/2019    Hepatic Function Latest Ref Rng & Units 06/01/2020 05/23/2020 12/30/2019  Total Protein 6.0 - 8.5 g/dL 6.8 7.1 7.1  Albumin 3.8 - 4.9 g/dL 4.5 4.6 4.6  AST 0 - 40 IU/L _0 ALT 0 - 44 IU/L _1 Alk Phosphatase 44 - 121 IU/L 187(H) 147(H) 130(H)  Total Bilirubin 0.0 - 1.2 mg/dL 0.3 0.5 0.7  Bilirubin, Direct 0.1 - 0.5 mg/dL - - -    Lab Results  Component Value Date/Time   TSH 0.881 06/01/2020 03:16 PM   TSH 0.994 03/25/2019 10:25 AM    CBC Latest Ref Rng & Units 05/23/2020 03/25/2019 03/09/2019  WBC 3.4 - 10.8 x10E3/uL 5.6 6.0 6.9  Hemoglobin 13.0 - 17.7 g/dL 13.7 16.2 16.2  Hematocrit 37.5 - 51.0 % 42.6 48.5 48.3  Platelets 150 - 450 x10E3/uL 196 174 189    Lab Results  Component Value Date/Time   VD25OH 37.7 06/28/2020 09:16 AM   VD25OH 16.4 (L) 06/01/2020 03:16 PM    Clinical ASCVD: Yes  The ASCVD Risk score (Goff DC Jr., et al., 2013) failed to calculate for the following reasons:   The patient has a prior MI or stroke diagnosis  Depression screen [facility] 2/9 09/02/2019 10/06/2018 08/19/2018  Decreased Interest 0 0 0  Down, Depressed, Hopeless 0 0 0  PHQ - 2 Score 0 0 0  Altered sleeping - - -  Tired, decreased energy - - -  Change in appetite - - -  Feeling bad or failure about yourself  - - -  Trouble concentrating - - -  Moving slowly or fidgety/restless - - -  Suicidal thoughts - - -  PHQ-9 Score - - -  Difficult doing work/chores - - -  Some recent data might be hidden       Social History   Tobacco Use  Smoking Status Never  Smokeless Tobacco Never   BP Readings from Last 3 Encounters:  08/10/20 126/62  08/03/20 128/74  06/01/20 135/77   Pulse Readings from Last 3 Encounters:   08/10/20 (!) 56  08/03/20 (!) 58  06/01/20 60   Wt Readings from Last 3 Encounters:  08/10/20 177 lb (80.3 kg)  08/03/20 177 lb 4 oz (80.4 kg)  06/01/20 181 lb (82.1 kg)   BMI Readings from Last 3 Encounters:  08/10/20 30.38 kg/m  08/03/20 32.66 kg/m  06/01/20 33.35 kg/m    Assessment/Interventions: Review of patient past medical history, allergies, medications, health status, including review of consultants reports, laboratory and other test data, was performed as part of comprehensive evaluation and provision of chronic care management services.   SDOH:  (Social Determinants of Health) assessments and interventions performed: No  SDOH Screenings   Alcohol Screen: Not on file  Depression (PJA2-5): Not on file  Financial Resource Strain: High Risk   Difficulty of Paying Living Expenses: Hard  Food Insecurity: Food Insecurity Present   Worried About Running Out of Food in the Last Year: Sometimes true   Ran Out of Food in the Last Year: Sometimes true  Housing: Not on file  Physical Activity: Not on file  Social Connections: Not on file  Stress: Not on file  Tobacco Use: Low Risk    Smoking Tobacco Use: Never   Smokeless Tobacco Use: Never  Transportation Needs: Not on file      Immunization History  Administered Date(s) Administered   Hepatitis A, Adult 09/26/2016, 05/22/2017   Hepatitis B, adult 09/26/2016, 05/22/2017, 11/29/2017   Influenza,inj,Quad PF,6+ Mos 12/22/2012, 12/21/2013, 11/29/2017, 12/02/2018   Influenza-Unspecified 11/30/2014, 11/18/2015   Moderna Sars-Covid-2 Vaccination 11/16/2019, 12/14/2019, 12/14/2019   Pneumococcal Polysaccharide-23 12/22/2012, 11/18/2015    Conditions to be addressed/monitored:  Hypertension, Hyperlipidemia, Diabetes, Coronary Artery Disease, GERD, Asthma and Osteoarthritis  There are no care plans that you recently modified to display for this patient.    Medication Assistance: None required.  Patient affirms  current coverage meets needs.  Patient's preferred pharmacy is:  [facility], [place] [address] Phone: [phone] Fax: [phone]  Uses pill box? Yes, wife [name] handles medications Pt endorses 80% compliance  We discussed: Benefits of medication synchronization, packaging and delivery as well as enhanced pharmacist oversight with [employer]. Patient decided to: Continue current medication management strategy  Care Plan and Follow Up Patient Decision:  Patient agrees to Care Plan and Follow-up.  Plan: Telephone follow up appointment with care management team member scheduled for:  CPA to follow monthly, PharmD every 2-3 months   Junita Push. Kenton Kingfisher PharmD, BCPS Clinical Pharmacist [facility] [phone]  Current Barriers:  Unable to independently monitor therapeutic efficacy Unable to achieve control of  diabetes, hypertension, Unable to maintain control of hyperliidemia Unable to self administer medications as prescribed Does not adhere to prescribed medication regimen Does not contact provider office for questions/concerns  Pharmacist Clinical Goal(s):  Patient will achieve adherence to monitoring guidelines and medication adherence to achieve therapeutic efficacy achieve control of diabetes  as evidenced by lab values, SMBG values maintain control of hyperlipidemia as evidenced by lab values  achieve ability to self administer medications as prescribed through use of pill box, pill packaging as evidenced by patient report adhere to prescribed medication regimen as evidenced by fill dates and lab bvalues contact provider office for questions/concerns as evidenced notation of same in electronic health record through collaboration with PharmD and provider.   Interventions: 1:1 collaboration with [name], NP regarding development and update of comprehensive plan of care as evidenced  by provider attestation and co-signature Inter-disciplinary care team collaboration (see longitudinal plan of care) Comprehensive medication review performed;  medication list updated in electronic medical record BP Readings from Last 3 Encounters:  08/10/20 126/62  08/03/20 128/74  06/01/20 135/77   Lab Results  Component Value Date   CREATININE 1.03 06/01/2020    Hypertension CKD(BP goal <130/80) -Controlled -Current treatment: Lisinopril 20 mg qd -Medications previously tried: NA  -Current home readings: *** -Current dietary habits: *** -Current exercise habits: *** -{ACTIONS;DENIES/REPORTS:21021675} hypotensive/hypertensive symptoms -Educated on {CCM BP Counseling:25124} -Counseled to monitor BP at home ***, document, and provide log at future appointments -{CCMPHARMDINTERVENTION:25122}  Lab Results  Component Value Date   CHOL 207 (H) 06/01/2020   HDL 27 (L) 06/01/2020   LDLCALC 72 06/01/2020   TRIG 694 (HH) 06/01/2020   CHOLHDL 5.3 03/03/2019  06/28/20- LDL 68, TG 179, tC 134, Hdl-c 36  HyperlipidemiaCAD,h/o CVA: (LDL goal < 70) -Not ideally controlled -Current treatment: Atorvastatin 80 mg qd Aspirin 81 mg qd Fenofibrate 54 mg qd -Medications previously tried: ***  -Current dietary patterns: *** -Current exercise habits: *** -Educated on {CCM HLD Counseling:25126} -{CCMPHARMDINTERVENTION:25122}  Lab Results  Component Value Date   HGBA1C 13.1 (H) 06/01/2020  Insulin resistance marker 67 06/28/20  Diabetes (A1c goal <7%) -Uncontrolled -Current medications: Jardiance  25 mg daily Tresiba 50 u every night at bedtime -Medications previously tried: ***  -Current home glucose readings fasting glucose: *** post prandial glucose: *** -{ACTIONS;DENIES/REPORTS:21021675} hypoglycemic/hyperglycemic symptoms -Current meal patterns:  breakfast: ***  lunch: ***  dinner: *** snacks: *** drinks: *** -Current exercise: *** -Educated on {CCM DM  COUNSELING:25123} -Counseled to check feet daily and get yearly eye exams -{CCMPHARMDINTERVENTION:25122}  *** (Goal: ***) -{US controlled/uncontrolled:25276} -Current treatment   -Medications previously tried: ***  -{CCMPHARMDINTERVENTION:25122}  Health Maintenance -Vaccine gaps: *** -Current therapy:   -Educated on {ccm supplement counseling:25128} -{CCM Patient satisfied:25129} -{CCMPHARMDINTERVENTION:25122}  Patient Goals/Self-Care Activities Patient will:  - {pharmacypatientgoals:24919}  Follow Up Plan: {CM FOLLOW UP YIRS:85462}

## 2020-09-12 ENCOUNTER — Telehealth: Payer: Self-pay | Admitting: Pharmacist

## 2020-09-12 IMAGING — DX DG ABD PORTABLE 1V
1 series · 1 of 1 positions shown · non-contrast
Comparison: Ultrasound 09/01/2018.

CLINICAL DATA: Hypertension. Diabetes. Severe headache. Abdominal
pain.

EXAM:
PORTABLE ABDOMEN - 1 VIEW

[abdomen supine]
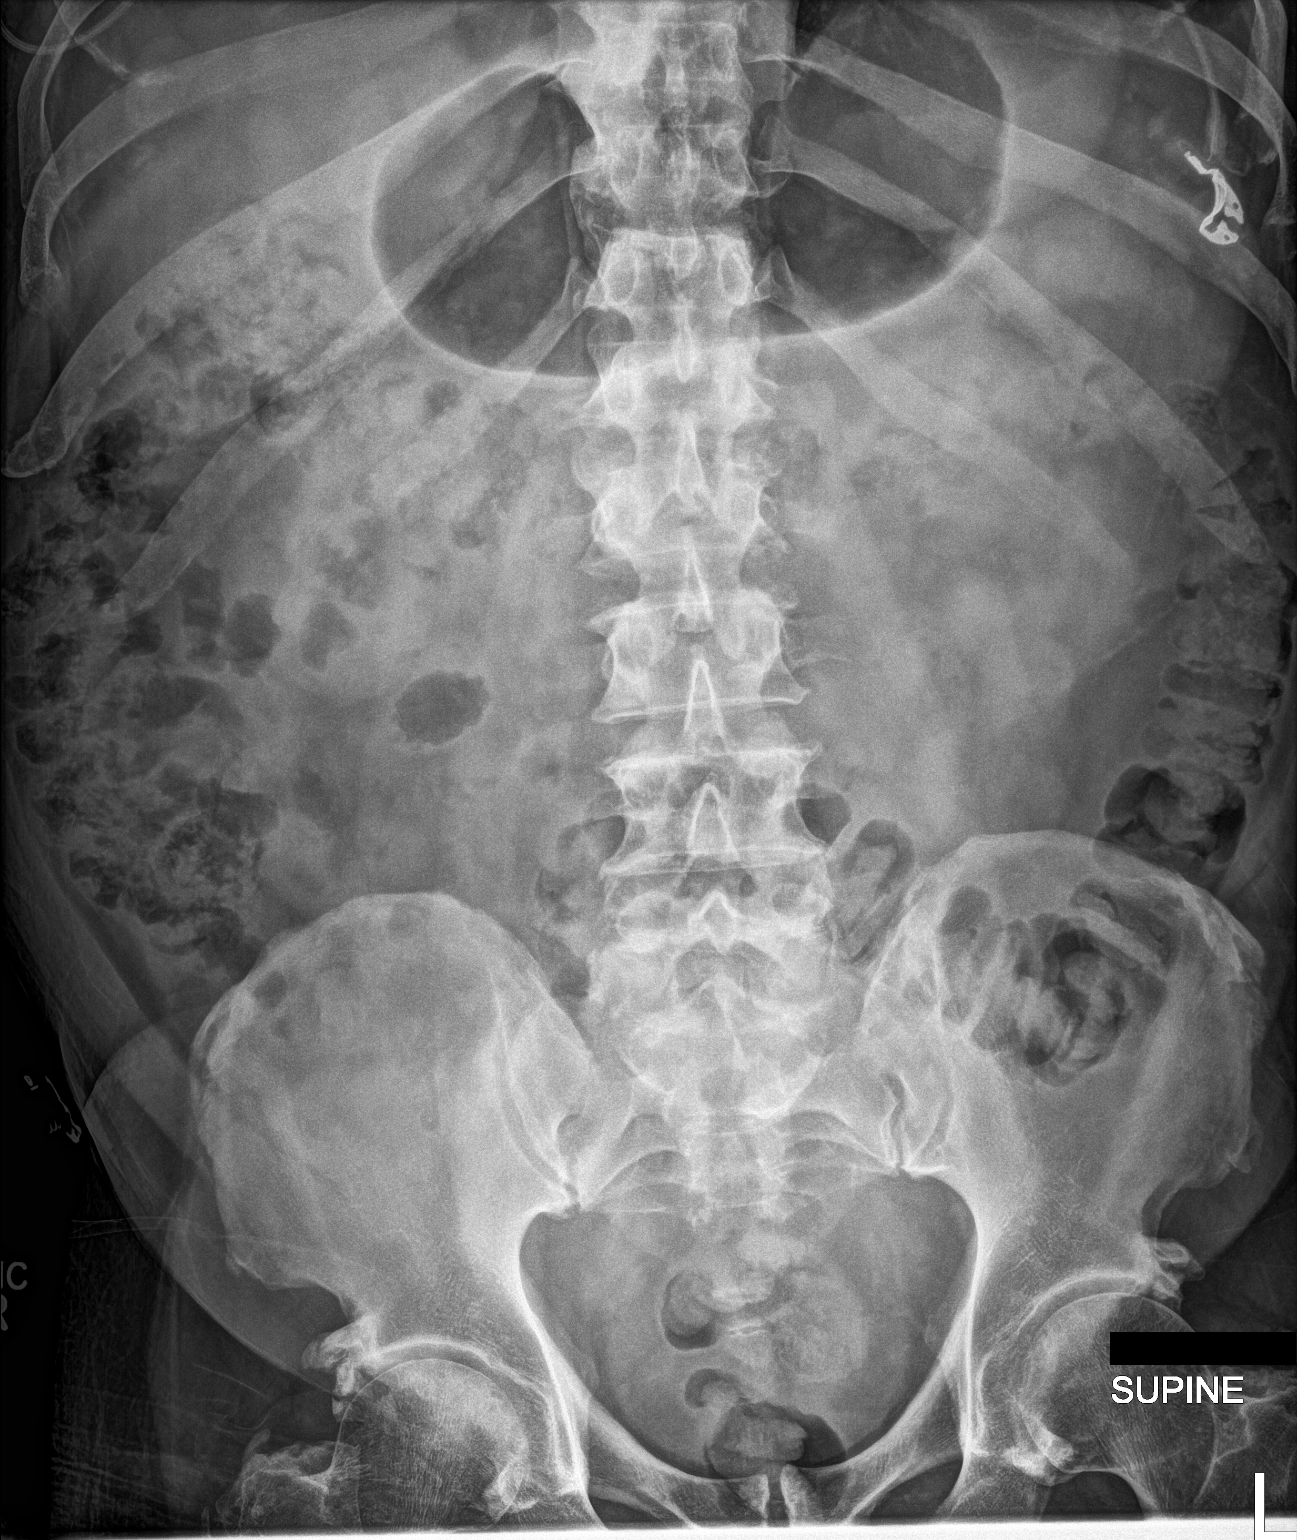

[1 of 1 positions shown; findings below may reference images not displayed]

FINDINGS: Soft tissue structures are unremarkable. Stool noted throughout the
colon. No bowel distention or free air. Degenerative changes lumbar
spine and both hips.
IMPRESSION: No acute abnormality identified.

## 2020-09-12 NOTE — Chronic Care Management (AMB) (Signed)
Chronic Care Management Pharmacy Assistant   Name: Yale Golla.  MRN: [id number] DOB: [birth year]   Reason for Encounter: Disease State Diabetes Mellitus Weekly follow up.    Recent office visits:  None noted  Recent consult visits:  08/03/20-Rohini Marius Ditch, MD (Gastroenterology) Initial visit. Positive for Cologuard. Recommend diagnostic colonoscopy Patient is preop clearance and interruption of Plavix for 5 days prior to colonoscopy from his cardiologist Recommend 2-day prep. Follow up prn.  Hospital visits:  None in previous 6 months  Medications: Outpatient Encounter Medications as of 09/12/2020  Medication Sig   aspirin 81 MG EC tablet Take 1 tablet (81 mg total) by mouth daily.   atorvastatin (LIPITOR) 80 MG tablet Take 1 tablet (80 mg total) by mouth daily.   Blood Glucose Monitoring Suppl (ONE TOUCH ULTRA 2) w/Device KIT Use to check blood sugar 4 times a day   BREO ELLIPTA 100-25 MCG/INH AEPB INHALE 1 PUFF BY MOUTH ONCE DAILY   Cholecalciferol 1.25 MG (50000 UT) TABS Take 1 tablet by mouth once a week. For 8 weeks and then stop.  Return to office for lab draw.   clobetasol cream (TEMOVATE) 6.38 % Apply 1 application topically 2 (two) times daily. Apply to affected area (Patient not taking: Reported on 08/10/2020)   clopidogrel (PLAVIX) 75 MG tablet Take 1 tablet by mouth daily.   empagliflozin (JARDIANCE) 25 MG TABS tablet Take 1 tablet (25 mg total) by mouth daily.   fenofibrate 54 MG tablet Take 1 tablet (54 mg total) by mouth daily.   gabapentin (NEURONTIN) 300 MG capsule TAKE 1 CAPSULE BY MOUTH TWICE DAILY   glucose blood (ONETOUCH ULTRA) test strip To check blood sugar three times daily   Insulin Pen Needle (PEN NEEDLES) 32G X 4 MM MISC 1 kit by Does not apply route daily.   Lancets (ONETOUCH ULTRASOFT) lancets Use as instructed   lisinopril (ZESTRIL) 20 MG tablet Take 1 tablet (20 mg total) by mouth daily. (Patient taking differently: Take 20 mg by mouth  daily. Taking 1/2 tab daily)   Misc. Devices (PULSE OXIMETER FOR FINGER) MISC To check O2 saturations once daily with asthma and document + check if any shortness of breath   montelukast (SINGULAIR) 10 MG tablet TAKE 1 TABLET BY MOUTH ONCE EVERY EVENING AT BEDTIME.   nitroGLYCERIN (NITROSTAT) 0.4 MG SL tablet Place 1 tablet (0.4 mg total) under the tongue every 5 (five) minutes as needed for chest pain. (Patient not taking: Reported on 08/10/2020)   omeprazole (PRILOSEC) 20 MG capsule Take 1 capsule (20 mg total) by mouth daily.   polyethylene glycol (MIRALAX) 17 g packet Take 17 g by mouth daily.   tiZANidine (ZANAFLEX) 2 MG tablet TAKE 1 TABLET BY MOUTH EVERY 6 HOURS AS NEEDED FOR MUSCLE SPASMS   TRESIBA FLEXTOUCH 100 UNIT/ML FlexTouch Pen INJECT 22 UNITS SUBQ ONCE DAILY   trimethoprim-polymyxin b (POLYTRIM) ophthalmic solution Place 1 drop into the right eye every 6 (six) hours.    ULTRACARE PEN NEEDLES 32G X 5 MM MISC    Vitamin D, Ergocalciferol, (DRISDOL) 1.25 MG (50000 UNIT) CAPS capsule TAKE 1 CAPSULE BY MOUTH ONCE A WEEK   No facility-administered encounter medications on file as of 09/12/2020.   Current antihyperglycemic regimen:  Tresiba 40 units daily Jardiance 25 mg daily  What recent interventions/DTPs have been made to improve glycemic control:  None noted  Have there been any recent hospitalizations or ED visits since last visit with CPP? No  Patient  denies hypoglycemic symptoms, including Pale, Sweaty, Shaky, Hungry, Nervous/irritable, and Vision changes  Patient reports hyperglycemic symptoms, including excessive thurst.  How often are you checking your blood sugar? 3-4 times daily  What are your blood sugars ranging?  Fasting: 213-09/12/20 Before meals:  After meals: 100-300 range Bedtime:   During the week, how often does your blood glucose drop below 70? Never  Are you checking your feet daily/regularly?       Patients wife states he does check his feet  daily.  Patients wife states he had fell today his leg gave out on him today she states he did not hit his head nor anything major happened, patient states he feels fine. I advised patient's wife to make sure to set an appointment with his PCP to get evaluated. Patients wife understood.  Adherence Review: Is the patient currently on a STATIN medication? Yes Is the patient currently on ACE/ARB medication? Yes Does the patient have >5 day gap between last estimated fill dates? No   Star Rating Drugs: Tresiba 100 unit Last filled:08/12/20 40 DS Jardiance 25 mg Last filled:07/20/20 30 DS Lisinopril 20 mg Last filled:08/05/20 90 DS Atorvastatin 80 mg Last filled:08/05/20 90 DS   [name], [place]

## 2020-09-16 ENCOUNTER — Other Ambulatory Visit: Payer: Self-pay | Admitting: Nurse Practitioner

## 2020-09-16 NOTE — Telephone Encounter (Signed)
Future visit in 5 days

## 2020-09-16 NOTE — Telephone Encounter (Signed)
Requested medication (s) are due for refill today: yes  Requested medication (s) are on the active medication list: yes  Last refill:  08/05/20 #30 1 refill  Future visit scheduled: yes in 5 days   Notes to clinic:  not delegated per protocol     Requested Prescriptions  Pending Prescriptions Disp Refills   tiZANidine (ZANAFLEX) 2 MG tablet [Pharmacy Med Name: TIZANIDINE HCL 2 MG TAB] 30 tablet 1    Sig: TAKE 1 TABLET BY MOUTH EVERY 6 HOURS AS NEEDED FOR MUSCLE SPASMS      Not Delegated - Cardiovascular:  Alpha-2 Agonists - tizanidine Failed - 09/16/2020  4:03 PM      Failed - This refill cannot be delegated      Passed - Valid encounter within last 6 months    Recent Outpatient Visits           3 months ago Uncontrolled type 2 diabetes mellitus with hyperglycemia ([place])   [place], Jolene T, NP   3 months ago Syncope, unspecified syncope type   [place], Lauren A, NP   6 months ago Uncontrolled type 2 diabetes mellitus with hyperglycemia ([place])   [place], Jolene T, NP   8 months ago Hypertension associated with diabetes ([place])   [place] Cannady, Jolene T, NP   11 months ago Uncontrolled type 2 diabetes mellitus with hyperglycemia ([place])   [place], Barbaraann Faster, NP       Future Appointments             In 5 days Cannady, Schaller T, NP [facility]              Signed Prescriptions Disp Refills   TRESIBA FLEXTOUCH 100 UNIT/ML FlexTouch Pen 9 mL 0    Sig: INJECT 22 UNITS SUBQ ONCE DAILY      Endocrinology:  Diabetes - Insulins Failed - 09/16/2020  4:03 PM      Failed - HBA1C is between 0 and 7.9 and within 180 days    HB A1C (BAYER DCA - WAIVED)  Date Value Ref Range Status  06/01/2020 13.1 (H) <7.0 % Final    Comment:                                          Diabetic Adult            <7.0                                       Healthy Adult         4.3 - 5.7                                                           (DCCT/NGSP) American Diabetes Association's Summary of Glycemic Recommendations for Adults with Diabetes: Hemoglobin A1c <7.0%. More stringent glycemic goals (A1c <6.0%) may further reduce complications at the cost of increased risk of hypoglycemia.           Passed - Valid encounter within last 6 months  Recent Outpatient Visits           3 months ago Uncontrolled type 2 diabetes mellitus with hyperglycemia ([place])   [place], [place] T, NP   3 months ago Syncope, unspecified syncope type   [facility], Lauren A, NP   6 months ago Uncontrolled type 2 diabetes mellitus with hyperglycemia ([place])   [place], [place] T, NP   8 months ago Hypertension associated with diabetes ([place])   [place], Jolene T, NP   11 months ago Uncontrolled type 2 diabetes mellitus with hyperglycemia ([place])   [place], Barbaraann Faster, NP       Future Appointments             In 5 days Cannady, Barbaraann Faster, NP [facility]               BREO ELLIPTA 100-25 MCG/INH AEPB 60 each 1    Sig: INHALE 1 PUFF BY MOUTH ONCE DAILY      Pulmonology:  Comb[employer] Products Passed - 09/16/2020  4:03 PM      Passed - Valid encounter within last 12 months    Recent Outpatient Visits           3 months ago Uncontrolled type 2 diabetes mellitus with hyperglycemia ([place])   [place], Jolene T, NP   3 months ago Syncope, unspecified syncope type   [facility], Lauren A, NP   6 months ago Uncontrolled type 2 diabetes mellitus with hyperglycemia ([place])   [place], Jolene T, NP   8 months ago Hypertension associated with diabetes ([place])   [place] Cannady, Jolene T, NP   11 months ago Uncontrolled type 2 diabetes mellitus with hyperglycemia  ([place])   [place], Barbaraann Faster, NP       Future Appointments             In 5 days Cannady, Barbaraann Faster, NP [facility]

## 2020-09-20 ENCOUNTER — Other Ambulatory Visit: Payer: Self-pay | Admitting: Nurse Practitioner

## 2020-09-20 NOTE — Telephone Encounter (Signed)
Copied from [place] [id number]. Topic: Quick Communication - Rx Refill/Question >> Sep 20, 2020 11:08 AM Tessa Lerner A wrote: Medication: TRESIBA FLEXTOUCH 100 UNIT/ML FlexTouch Pen   Has the patient contacted their pharmacy? Yes.   (Agent: If no, request that the patient contact the pharmacy for the refill.) (Agent: If yes, when and what did the pharmacy advise?)  Preferred Pharmacy (with phone number or street name): [facility] [place], [place].  Phone:  [phone] Fax:  [phone]   Agent: Please be advised that RX refills may take up to 3 business days. We ask that you follow-up with your pharmacy.

## 2020-09-20 NOTE — Telephone Encounter (Signed)
Pharmacy has script from 09/16/2020 and will get ready for patient

## 2020-09-20 NOTE — Telephone Encounter (Signed)
Pt is scheduled 7/6

## 2020-09-21 ENCOUNTER — Other Ambulatory Visit: Payer: Self-pay

## 2020-09-21 ENCOUNTER — Encounter: Payer: Self-pay | Admitting: Nurse Practitioner

## 2020-09-21 ENCOUNTER — Ambulatory Visit (INDEPENDENT_AMBULATORY_CARE_PROVIDER_SITE_OTHER): Payer: Medicare Other | Admitting: Nurse Practitioner

## 2020-09-21 VITALS — BP 106/64 | HR 66 | Temp 97.5°F | Wt 174.2 lb

## 2020-09-21 DIAGNOSIS — E1129 Type 2 diabetes mellitus with other diabetic kidney complication: Secondary | ICD-10-CM

## 2020-09-21 DIAGNOSIS — E1165 Type 2 diabetes mellitus with hyperglycemia: Secondary | ICD-10-CM | POA: Diagnosis not present

## 2020-09-21 DIAGNOSIS — R809 Proteinuria, unspecified: Secondary | ICD-10-CM

## 2020-09-21 DIAGNOSIS — E701 Other hyperphenylalaninemias: Secondary | ICD-10-CM

## 2020-09-21 DIAGNOSIS — R0989 Other specified symptoms and signs involving the circulatory and respiratory systems: Secondary | ICD-10-CM | POA: Diagnosis not present

## 2020-09-21 DIAGNOSIS — Z6830 Body mass index (BMI) 30.0-30.9, adult: Secondary | ICD-10-CM

## 2020-09-21 DIAGNOSIS — J4540 Moderate persistent asthma, uncomplicated: Secondary | ICD-10-CM

## 2020-09-21 DIAGNOSIS — E6609 Other obesity due to excess calories: Secondary | ICD-10-CM

## 2020-09-21 DIAGNOSIS — E785 Hyperlipidemia, unspecified: Secondary | ICD-10-CM

## 2020-09-21 DIAGNOSIS — I152 Hypertension secondary to endocrine disorders: Secondary | ICD-10-CM | POA: Diagnosis not present

## 2020-09-21 DIAGNOSIS — R011 Cardiac murmur, unspecified: Secondary | ICD-10-CM | POA: Diagnosis not present

## 2020-09-21 DIAGNOSIS — E1159 Type 2 diabetes mellitus with other circulatory complications: Secondary | ICD-10-CM | POA: Diagnosis not present

## 2020-09-21 DIAGNOSIS — E1169 Type 2 diabetes mellitus with other specified complication: Secondary | ICD-10-CM

## 2020-09-21 LAB — BAYER DCA HB A1C WAIVED: HB A1C (BAYER DCA - WAIVED): 9.9 % — ABNORMAL HIGH (ref <7.0)

## 2020-09-21 MED ORDER — TRESIBA FLEXTOUCH 100 UNIT/ML ~~LOC~~ SOPN: PEN_INJECTOR | SUBCUTANEOUS | 4 refills | Status: DC

## 2020-09-21 MED ORDER — TIZANIDINE HCL 2 MG PO TABS: 2.0000 mg | ORAL_TABLET | Freq: Four times a day (QID) | ORAL | 1 refills | Status: DC | PRN

## 2020-09-21 NOTE — Assessment & Plan Note (Signed)
Recommended eating smaller high protein, low fat meals more frequently and exercising 30 mins a day 5 times a week with a goal of 10-15lb weight loss in the next 3 months. Patient voiced their understanding and motivation to adhere to these recommendations.

## 2020-09-21 NOTE — Assessment & Plan Note (Signed)
Mental delays present, continue to work with CCM team in [place] with education and reiterating diet needs.

## 2020-09-21 NOTE — Assessment & Plan Note (Signed)
Chronic, stable with BP at goal in office.  Continue current medication regimen and collaboration with cardiology, adjust regimen as needed.  Labs up to date.  Recommend they check his BP at home at least daily and document + focus on DASH diet.  Return to office in 3 months for BP follow-up.

## 2020-09-21 NOTE — Assessment & Plan Note (Signed)
R>L, recommend he return to cardiology for further testing as was ordered.  Continue to work on stroke prevention goals, discussed with patient.  Continue to collaborate with CCM team.

## 2020-09-21 NOTE — Assessment & Plan Note (Signed)
Chronic, ongoing.  A1C downward trend today to 9.9% and urine ALB 10 in March 2022 -- continue collaboration with nephrology.  Has been making poor diet choices. Is scheduled to see endo in September and explained to both wife and him importance of attending that visit.  Continue Jardiance and increase Tresiba to 60 units QHS, if fasting BS remain >130 in 3 days then increase by 2 units -- discussed with his wife who was in office, but not at bedside.   - Poor tolerance to Metformin in past and allergic to Sulfa.  Would avoid GLP at this time due to patient history of pancreatitis, concern this would flare.  Educated patient at length on effect of diabetes from head to toe and increased risk for recurrent CVA due to poor control.   - Recommend they check his BS TID -- he would benefit from [facility] will work on this with CCM team.  Will most likely need meal time insulin, although concern as unsure would consistently due -- his wife handles his medications.  Goal A1C <6.5%.  Continue collaboration with CCM team.  Return in 3 months.

## 2020-09-21 NOTE — Assessment & Plan Note (Signed)
Grade 2/6 on auscultation, continue collaboration with cardiology.  Recommend he return for the testing that was ordered.

## 2020-09-21 NOTE — Progress Notes (Signed)
BP 106/64 (BP Location: Left Arm)   Pulse 66   Temp (!) 97.5 F (36.4 C) (Oral)   Wt 174 lb 3.2 oz (79 kg)   SpO2 96%   BMI 29.90 kg/m    Subjective:    Patient ID: [name]., male    DOB: 04/03/1960, 60 y.o.   MRN: [id number]  HPI: Keghan Mcfarren. is a 60 y.o. male  Chief Complaint  Patient presents with   Diabetes   Hyperlipidemia   Hypertension   Wife is not with him at bedside today, at baseline patient poor historian and wife often monitors medications and health.  DIABETES Recent A1C in March 13.1%.  He continues on Jardiance 25 MG along with [place].  He is scheduled to see endocrinology for initial visit on 11/24/20.  His wife, [name], is in charge of medications at home.  In past Metformin caused kidney issues.  Has history of GI issues and pancreatitis -- has struggled with GLP1 in past with GI issues.  Saw Dr. Juleen China with nephrology on 01/25/20 for proteinuria -- did not return in May as scheduled, reports he could not afford the $30.   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 Has seen dietician.  He reports he is trying to drink zero sugar soda only.  Hypoglycemic episodes:no Polydipsia/polyuria: yes Visual disturbance: no Chest pain: no Paresthesias: no Glucose Monitoring: yes             Accucheck frequency: TID -- did not bring book with him today             Fasting glucose: 200 range he reports, no 100's             Post prandial:              Evening:             Before meals: Taking Insulin?: yes             Long acting insulin: Tresiba 52 units             Short acting insulin: Blood Pressure Monitoring: not checking Retinal Examination: Not up to Date Foot Exam: Up to Date Pneumovax: Up to Date Influenza: Up to Date Aspirin: yes   ASTHMA Started Breo on 09/02/19 and Albuterol as needed.  Never a smoker.  FEV1 70% in June 2021. 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 Sees Dr. Humphrey Rolls for cardiology and is scheduled upcoming for visit and echo + carotid dopplers due bruit per patient report -- he reports needing the copay for this and is waiting to attend. Had initial visit with Dr. Melrose Nakayama on 11/09/19 -- he is to continue Plavix and ASA.    Current medications Lisinopril, Atorvastatin, Zetia, Fenofibrate,  Satisfied with current treatment? yes Duration of hypertension: chronic BP monitoring frequency: daily BP range:  BP medication side effects: no Duration of hyperlipidemia: chronic Cholesterol medication side effects: no Cholesterol supplements: none Medication compliance: good compliance Aspirin: yes Recent stressors: no Recurrent headaches: no Visual changes: no  Palpitations: no Dyspnea: occasional Chest pain: no Lower extremity edema: no Dizzy/lightheaded: no   Relevant past medical, surgical, family and social history reviewed and updated as indicated. Interim medical history since our last visit reviewed. Allergies and medications reviewed and updated.  Review of Systems  Constitutional:  Negative for activity change, diaphoresis, fatigue and fever.  Respiratory:  Positive for shortness of breath (occasional at baseline). Negative for cough, chest tightness and wheezing.   Cardiovascular:  Negative for chest pain, palpitations and leg swelling.  Gastrointestinal: Negative.   Endocrine: Negative for cold intolerance, heat intolerance, polydipsia, polyphagia and polyuria.  Neurological: Negative.   Psychiatric/Behavioral:  Negative.     Per HPI unless specifically indicated above     Objective:    BP 106/64 (BP Location: Left Arm)   Pulse 66   Temp (!) 97.5 F (36.4 C) (Oral)   Wt 174 lb 3.2 oz (79 kg)   SpO2 96%   BMI 29.90 kg/m   Wt Readings from Last 3 Encounters:  09/21/20 174 lb 3.2 oz (79 kg)  08/10/20 177 lb (80.3 kg)  08/03/20 177 lb 4 oz (80.4 kg)    Physical Exam Vitals and nursing note reviewed.  Constitutional:      General: He is awake. He is not in acute distress.    Appearance: He is well-developed. He is obese. He is not ill-appearing.  HENT:     Head: Normocephalic and atraumatic.     Right Ear: Hearing normal. No drainage.     Left Ear: Hearing normal. No drainage.  Eyes:     General: Lids are normal.        Right eye: No discharge.        Left eye: No discharge.     Conjunctiva/sclera: Conjunctivae normal.     Pupils: Pupils are equal, round, and reactive to light.  Neck:     Thyroid: No thyromegaly.     Vascular: Carotid bruit (R>L) present.  Cardiovascular:     Rate and Rhythm: Normal rate and regular rhythm.     Heart sounds: S1 normal and S2 normal. Murmur heard.  Systolic murmur is present with a grade of 2/6.    No gallop.     Comments: Systolic murmur noted best at left sternal border, soft. Pulmonary:     Effort: Pulmonary effort is normal. No accessory muscle usage or respiratory distress.     Breath sounds: Normal breath sounds.  Abdominal:     General: Bowel sounds are normal.     Palpations: Abdomen is soft.  Musculoskeletal:        General: Normal range of motion.     Cervical back: Normal range of motion and neck supple.     Right lower leg: No edema.     Left lower leg: No edema.  Skin:    General: Skin is warm and dry.     Capillary Refill: Capillary refill takes less than 2 seconds.  Neurological:     Mental Status: He is alert and oriented to person, place, and time.     Deep Tendon Reflexes: Reflexes are normal and symmetric.   Psychiatric:        Attention and Perception: Attention normal.        Mood and Affect: Mood normal.        Speech: Speech normal.        Behavior: Behavior normal. Behavior is cooperative.    Results for orders placed or performed in visit on 09/21/20  Bayer DCA Hb A1c Waived  Result Value Ref Range   HB A1C (BAYER DCA - WAIVED) 9.9 (H) <7.0 %      Assessment & Plan:   Problem List Items Addressed This Visit       Cardiovascular and Mediastinum   Hypertension associated with diabetes ([place])    Chronic, stable with BP at goal in office.  Continue current medication regimen and collaboration with cardiology, adjust regimen as needed.  Labs up to date.  Recommend they check his BP at home at least daily and document + focus on DASH diet.  Return to office in 3 months for BP follow-up.       Relevant Medications   insulin degludec (TRESIBA FLEXTOUCH) 100 UNIT/ML FlexTouch Pen   Other Relevant Orders   Bayer DCA Hb A1c Waived (Completed)     Respiratory   Asthma    Chronic, ongoing.  FEV1 70% at check in 2021.  Tolerating Breo, to continue to use daily.  Recommend using Albuterol as needed only if episodes of SOB present. Return in 3 months. Repeat spirometry next visit.         Endocrine   Hyperlipidemia associated with type 2 diabetes mellitus (HCC)    Chronic, ongoing.  Continue Lipitor, Zetia, and Fenofibrate. Lipid panel next visit.  Adjust doses as needed, if LDL >70.       Relevant Medications   insulin degludec (TRESIBA FLEXTOUCH) 100 UNIT/ML FlexTouch Pen   Uncontrolled type 2 diabetes mellitus with hyperglycemia (HCC)    Chronic, ongoing.  A1C downward trend today to 9.9%.  Has been making poor diet choices. Is scheduled to see endo in September and explained to both wife and him importance of attending that visit.  Continue Jardiance and increase Tresiba to 60 units QHS, if fasting BS remain >130 in 3 days then increase by 2 units -- discussed with his wife who  was in office, but not at bedside.   - Poor tolerance to Metformin in past and allergic to Sulfa.  Would avoid GLP at this time due to patient history of pancreatitis, concern this would flare.  Educated patient at length on effect of diabetes from head to toe and increased risk for recurrent CVA due to poor control.   - Recommend they check his BS TID -- he would benefit from [facility] will work on this with CCM team.  Will most likely need meal time insulin, although concern as unsure would consistently due -- his wife handles his medications.  Goal A1C <6.5%.  Continue collaboration with CCM team.  Return in 3 months.       Relevant Medications   insulin degludec (TRESIBA FLEXTOUCH) 100 UNIT/ML FlexTouch Pen   Type 2 diabetes mellitus with proteinuria (HCC) - Primary    Chronic, ongoing.  A1C downward trend today to 9.9% and urine ALB 10 in March 2022 -- continue collaboration with nephrology.  Has been making poor diet choices. Is scheduled to see endo in September and explained to both wife and him importance of attending that visit.  Continue Jardiance and increase Tresiba to 60 units QHS, if fasting BS remain >130 in 3 days then increase by 2 units -- discussed with his wife who was in office, but not at bedside.   - Poor tolerance to Metformin in past and allergic to Sulfa.  Would avoid GLP at this time due to patient history of pancreatitis, concern this would flare.  Educated patient at length on effect of  diabetes from head to toe and increased risk for recurrent CVA due to poor control.   - Recommend they check his BS TID -- he would benefit from [facility] will work on this with CCM team.  Will most likely need meal time insulin, although concern as unsure would consistently due -- his wife handles his medications.  Goal A1C <6.5%.  Continue collaboration with CCM team.  Return in 3 months.       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       Obesity    Recommended eating smaller high protein, low fat meals more frequently and exercising 30 mins a day 5 times a week with a goal of 10-15lb weight loss in the next 3 months. Patient voiced their understanding and motivation to adhere to these recommendations.        Relevant Medications   insulin degludec (TRESIBA FLEXTOUCH) 100 UNIT/ML FlexTouch Pen   Heart murmur    Grade 2/6 on auscultation, continue collaboration with cardiology.  Recommend he return for the testing that was ordered.       Carotid artery bruit    R>L, recommend he return to cardiology for further testing as was ordered.  Continue to work on stroke prevention goals, discussed with patient.  Continue to collaborate with CCM team.        Time: 25 minutes, >50% spent counseling/or care coordination/education   Follow up plan: Return in about 3 months (around 12/22/2020) for T2DM, HTN/HLD, GERD, ASTHMA -- need spirometry.

## 2020-09-21 NOTE — Assessment & Plan Note (Signed)
Chronic, ongoing.  A1C downward trend today to 9.9%.  Has been making poor diet choices. Is scheduled to see endo in September and explained to both wife and him importance of attending that visit.  Continue Jardiance and increase Tresiba to 60 units QHS, if fasting BS remain >130 in 3 days then increase by 2 units -- discussed with his wife who was in office, but not at bedside.   - Poor tolerance to Metformin in past and allergic to Sulfa.  Would avoid GLP at this time due to patient history of pancreatitis, concern this would flare.  Educated patient at length on effect of diabetes from head to toe and increased risk for recurrent CVA due to poor control.   - Recommend they check his BS TID -- he would benefit from [facility] will work on this with CCM team.  Will most likely need meal time insulin, although concern as unsure would consistently due -- his wife handles his medications.  Goal A1C <6.5%.  Continue collaboration with CCM team.  Return in 3 months.

## 2020-09-21 NOTE — Assessment & Plan Note (Addendum)
Chronic, ongoing.  Continue Lipitor, Zetia, and Fenofibrate. Lipid panel next visit.  Adjust doses as needed, if LDL >70.

## 2020-09-21 NOTE — Assessment & Plan Note (Signed)
Chronic, ongoing.  FEV1 70% at check in 2021.  Tolerating Breo, to continue to use daily.  Recommend using Albuterol as needed only if episodes of SOB present. Return in 3 months. Repeat spirometry next visit.

## 2020-09-21 NOTE — Patient Instructions (Signed)
Take the Tizanidine as needed for muscle pain.  Diabetes Mellitus and Nutrition, Adult When you have diabetes, or diabetes mellitus, it is very important to have healthy eating habits because your blood sugar (glucose) levels are greatly affected by what you eat and drink. Eating healthy foods in the right amounts, at about the same times every day, can help you: Control your blood glucose. Lower your risk of heart disease. Improve your blood pressure. Reach or maintain a healthy weight. What can affect my meal plan? Every person with diabetes is different, and each person has different needs for a meal plan. Your health care provider may recommend that you work with a dietitian to make a meal plan that is best for you. Your meal plan may vary depending on factors such as: The calories you need. The medicines you take. Your weight. Your blood glucose, blood pressure, and cholesterol levels. Your activity level. Other health conditions you have, such as heart or kidney disease. How do carbohydrates affect me? Carbohydrates, also called carbs, affect your blood glucose level more than any other type of food. Eating carbs naturally raises the amount of glucose in your blood. Carb counting is a method for keeping track of how many carbs you eat. Counting carbs is important to keep your blood glucose at a healthy level,especially if you use insulin or take certain oral diabetes medicines. It is important to know how many carbs you can safely have in each meal. This is different for every person. Your dietitian can help you calculate how manycarbs you should have at each meal and for each snack. How does alcohol affect me? Alcohol can cause a sudden decrease in blood glucose (hypoglycemia), especially if you use insulin or take certain oral diabetes medicines. Hypoglycemia can be a life-threatening condition. Symptoms of hypoglycemia, such as sleepiness, dizziness, and confusion, are similar to  symptoms of having too much alcohol. Do not drink alcohol if: Your health care provider tells you not to drink. You are pregnant, may be pregnant, or are planning to become pregnant. If you drink alcohol: Do not drink on an empty stomach. Limit how much you use to: 0-1 drink a day for women. 0-2 drinks a day for men. Be aware of how much alcohol is in your drink. In the [place] one drink equals one 12 oz bottle of beer (355 mL), one 5 oz glass of wine (148 mL), or one 1 oz glass of hard liquor (44 mL). Keep yourself hydrated with water, diet soda, or unsweetened iced tea. Keep in mind that regular soda, juice, and other mixers may contain a lot of sugar and must be counted as carbs. What are tips for following this plan?  Reading food labels Start by checking the serving size on the "Nutrition Facts" label of packaged foods and drinks. The amount of calories, carbs, fats, and other nutrients listed on the label is based on one serving of the item. Many items contain more than one serving per package. Check the total grams (g) of carbs in one serving. You can calculate the number of servings of carbs in one serving by dividing the total carbs by 15. For example, if a food has 30 g of total carbs per serving, it would be equal to 2 servings of carbs. Check the number of grams (g) of saturated fats and trans fats in one serving. Choose foods that have a low amount or none of these fats. Check the number of milligrams (mg) of salt (  sodium) in one serving. Most people should limit total sodium intake to less than 2,300 mg per day. Always check the nutrition information of foods labeled as "low-fat" or "nonfat." These foods may be higher in added sugar or refined carbs and should be avoided. Talk to your dietitian to identify your daily goals for nutrients listed on the label. Shopping Avoid buying canned, pre-made, or processed foods. These foods tend to be high in fat, sodium, and added sugar. Shop  around the outside edge of the grocery store. This is where you will most often find fresh fruits and vegetables, bulk grains, fresh meats, and fresh dairy. Cooking Use low-heat cooking methods, such as baking, instead of high-heat cooking methods like deep frying. Cook using healthy oils, such as olive, canola, or sunflower oil. Avoid cooking with butter, cream, or high-fat meats. Meal planning Eat meals and snacks regularly, preferably at the same times every day. Avoid going long periods of time without eating. Eat foods that are high in fiber, such as fresh fruits, vegetables, beans, and whole grains. Talk with your dietitian about how many servings of carbs you can eat at each meal. Eat 4-6 oz (112-168 g) of lean protein each day, such as lean meat, chicken, fish, eggs, or tofu. One ounce (oz) of lean protein is equal to: 1 oz (28 g) of meat, chicken, or fish. 1 egg.  cup (62 g) of tofu. Eat some foods each day that contain healthy fats, such as avocado, nuts, seeds, and fish. What foods should I eat? Fruits Berries. Apples. Oranges. Peaches. Apricots. Plums. Grapes. Mango. Papaya.Pomegranate. Kiwi. Cherries. Vegetables Lettuce. Spinach. Leafy greens, including kale, chard, collard greens, and mustard greens. Beets. Cauliflower. Cabbage. Broccoli. Carrots. Green beans.Tomatoes. Peppers. Onions. Cucumbers. Brussels sprouts. Grains Whole grains, such as whole-wheat or whole-grain bread, crackers, tortillas,cereal, and pasta. Unsweetened oatmeal. Quinoa. Brown or wild rice. Meats and other proteins Seafood. Poultry without skin. Lean cuts of poultry and beef. Tofu. Nuts. Seeds. Dairy Low-fat or fat-free dairy products such as milk, yogurt, and cheese. The items listed above may not be a complete list of foods and beverages you can eat. Contact a dietitian for more information. What foods should I avoid? Fruits Fruits canned with syrup. Vegetables Canned vegetables. Frozen vegetables  with butter or cream sauce. Grains Refined white flour and flour products such as bread, pasta, snack foods, andcereals. Avoid all processed foods. Meats and other proteins Fatty cuts of meat. Poultry with skin. Breaded or fried meats. Processed meat.Avoid saturated fats. Dairy Full-fat yogurt, cheese, or milk. Beverages Sweetened drinks, such as soda or iced tea. The items listed above may not be a complete list of foods and beverages you should avoid. Contact a dietitian for more information. Questions to ask a health care provider Do I need to meet with a diabetes educator? Do I need to meet with a dietitian? What number can I call if I have questions? When are the best times to check my blood glucose? Where to find more information: American Diabetes Association: diabetes.org Academy of Nutrition and Dietetics: www.eatright.[employer] of Diabetes and Digestive and Kidney Diseases: [employer] Association of Diabetes Care and Education Specialists: www.diabeteseducator.org Summary It is important to have healthy eating habits because your blood sugar (glucose) levels are greatly affected by what you eat and drink. A healthy meal plan will help you control your blood glucose and maintain a healthy lifestyle. Your health care provider may recommend that you work with a dietitian to make a  meal plan that is best for you. Keep in mind that carbohydrates (carbs) and alcohol have immediate effects on your blood glucose levels. It is important to count carbs and to use alcohol carefully. This information is not intended to replace advice given to you by your health care provider. Make sure you discuss any questions you have with your healthcare provider. Document Revised: 02/10/2019 Document Reviewed: 02/10/2019 Elsevier Patient Education  2021 [employer].

## 2020-09-22 ENCOUNTER — Telehealth: Payer: Self-pay

## 2020-09-22 MED ORDER — EPINEPHRINE 0.3 MG/0.3ML IJ SOAJ: 0.3000 mg | INTRAMUSCULAR | 4 refills | Status: DC | PRN

## 2020-09-22 NOTE — Telephone Encounter (Signed)
Copied from [place] [id number]. 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

## 2020-09-22 NOTE — Telephone Encounter (Signed)
Called [name] pt's wife advised epipen was sent in she verbalized understanding

## 2020-09-27 ENCOUNTER — Other Ambulatory Visit: Payer: Self-pay

## 2020-09-27 ENCOUNTER — Ambulatory Visit: Payer: Medicare Other | Admitting: Dermatology

## 2020-09-27 DIAGNOSIS — L720 Epidermal cyst: Secondary | ICD-10-CM

## 2020-09-27 NOTE — Patient Instructions (Signed)
[name] [place] [facility] [id number]   You have an upcoming surgery appointment scheduled at [facility]. _0 @  PRE-OPERATIVE INSTRUCTIONS  We recommend you read the following instructions. If you have any questions or concerns, please call the office at [phone].  Shower and wash the entire body with soap and water the day of your surgery paying special attention to cleansing at and around the planned surgery site. Avoid aspirin or aspirin containing products at least ten (10) days prior to your surgical procedure and for at least one week (7 days) after your surgical procedure. If you take aspirin on a regular basis for heart disease or history of stroke or for any other reason, we may recommend you continue taking aspirin but please notify us if you take this on a regular basis. Aspirin can cause more bleeding to occur during surgery as well as prolonged bleeding and bruising after surgery. Avoid other nonsteroidal pain medications at least one week prior to surgery and at least one week after your surgery. These include medications such as Ibuprofen (Motrin, Advil and Nuprin), Naprosyn, Voltaren, Relafen, etc. If these medications are used for therapeutic reasons, please inform us as they can cause increased bleeding or prolonged bleeding during and bruising after surgical procedures.  Please advise [name] if you are taking any "blood thinner" medications such as Coumadin or Dipyridamole or Plavix or similar medications. These cause increased bleeding and prolonged bleeding during procedures and bruising after surgical procedures. We may have to consider discontinuing these medications briefly prior to and shortly after your surgery if safe to do so. Please inform us of all medications you are currently taking. All medications that are taken regularly should be taken the day of surgery as you always do. Nevertheless, we need to be informed of what  medications you are taking prior to surgery to know whether they will affect the procedure or cause any complications. Please inform us of any medication allergies. Also inform us of whether you have allergies to Latex or rubber products or whether you have had any adverse reaction to Lidocaine or Epinephrine. Please inform us of any prosthetic or artificial body parts such as artificial heart valve, joint replacements, etc., or similar condition that might require preoperative antibiotics. We recommend avoidance of alcohol at least two weeks prior to surgery and continued avoidance for at least two weeks after surgery. We recommend avoidance of tobacco smoking at least two weeks prior to surgery and continued abstinence for at least two weeks after surgery. Do not plan strenuous exercise, strenuous work or strenuous lifting for approximately four weeks after your surgery. We request if you are unable to make your scheduled surgical appointment, please call us at least a week in advance or as soon as you are aware of a problem so that we can cancel or reschedule the appointment. You MAY TAKE TYLENOL (acetaminophen) for pain as it is not a blood thinner. PLEASE PLAN TO BE IN TOWN FOR TWO WEEKS FOLLOWING SURGERY. THIS IS IMPORTANT SO YOU CAN BE CHECKED FOR DRESSING CHANGES, SUTURE REMOVAL AND TO MONITOR FOR POSSIBLE COMPLICATIONS.  If you have any questions or concerns for your doctor, please call our main line at [phone] and press option 4 to reach your doctor's medical assistant. If no one answers, please leave a voicemail as directed and we will return your call as soon as possible. Messages left after 4 pm will be answered the following business day.   You  may also send [name] a message via [place]. We typically respond to MyChart messages within 1-2 business days.  For prescription refills, please ask your pharmacy to contact our office. Our fax number is [phone].  If you have an urgent  issue when the clinic is closed that cannot wait until the next business day, you can page your doctor at the number below.    Please note that while we do our best to be available for urgent issues outside of office hours, we are not available 24/7.   If you have an urgent issue and are unable to reach [name], you may choose to seek medical care at your doctor's office, retail clinic, urgent care center, or emergency room.  If you have a medical emergency, please immediately call 911 or go to the emergency department.  Pager Numbers  - Dr. Nehemiah Massed: [phone]  - Dr. Laurence Ferrari: [phone]  - Dr. Nicole Kindred: [phone]  In the event of inclement weather, please call our main line at [phone] for an update on the status of any delays or closures.  Dermatology Medication Tips: Please keep the boxes that topical medications come in in order to help keep track of the instructions about where and how to use these. Pharmacies typically print the medication instructions only on the boxes and not directly on the medication tubes.   If your medication is too expensive, please contact our office at [phone] option 4 or send [name] a message through [place].   We are unable to tell what your co-pay for medications will be in advance as this is different depending on your insurance coverage. However, we may be able to find a substitute medication at lower cost or fill out paperwork to get insurance to cover a needed medication.   If a prior authorization is required to get your medication covered by your insurance company, please allow [name] 1-2 business days to complete this process.  Drug prices often vary depending on where the prescription is filled and some pharmacies may offer cheaper prices.  The website www.goodrx.com contains coupons for medications through different pharmacies. The prices here do not account for what the cost may be with help from insurance (it may be cheaper with your  insurance), but the website can give you the price if you did not use any insurance.  - You can print the associated coupon and take it with your prescription to the pharmacy.  - You may also stop by our office during regular business hours and pick up a GoodRx coupon card.  - If you need your prescription sent electronically to a different pharmacy, notify our office through [facility] or by phone at [phone] option 4.

## 2020-09-27 NOTE — Progress Notes (Signed)
New Patient Visit  Subjective  [name]. is a 60 y.o. male who presents for the following: Cyst (Left knee - sore, has been there for a few years).  Accompanied by wife  The following portions of the chart were reviewed this encounter and updated as appropriate:        Review of Systems:  No other skin or systemic complaints except as noted in HPI or Assessment and Plan.  Objective  Well appearing patient in no apparent distress; mood and affect are within normal limits.  A focused examination was performed including left knee. Relevant physical exam findings are noted in the Assessment and Plan.  Left lat knee 2.5 cm tender firm SQ nodule with adjacent 1.5 cm tender SQ nodule.   Assessment & Plan  Epidermal inclusion cyst Left lat knee  With associated pain  Benign-appearing. Exam most consistent with an epidermal inclusion cyst. Discussed that a cyst is a benign growth that can grow over time and sometimes get irritated or inflamed. Recommend observation if it is not bothersome. Discussed option of surgical excision to remove it if it is growing, symptomatic, or other changes noted. Please call for new or changing lesions so they can be evaluated.  Cyst with symptoms and/or recent change.  Discussed surgical excision to remove, including resulting scar and possible recurrence.  Patient will schedule for surgery. Pre-op information given.   May only be able to remove one at a time.  May or may not relieve associated pain.  If knee pain continues, would need to see orthopedist.    Return for Surgery.  I, Ashok Cordia, CMA, am acting as scribe for [name], MD .  Documentation: I have reviewed the above documentation for accuracy and completeness, and I agree with the above.  [name] MD

## 2020-09-28 ENCOUNTER — Telehealth: Payer: Self-pay

## 2020-09-28 ENCOUNTER — Ambulatory Visit: Payer: Medicare Other | Admitting: Dermatology

## 2020-09-28 NOTE — Telephone Encounter (Signed)
Copied from [place] [id number]. Topic: General - Other >> Sep 28, 2020  9:54 AM Yvette Rack wrote: Reason for CRM: Pt spouse [name] stated pt has an appt on 11/14/20 for a procedure to remove double cyst. [name] stated pt was given paperwork about the procedure and it lists medications that should be stopped prior to the procedure. [name] stated aspirin is listed and she would like to request a call back to explain which medications pt should stop taking and when to stop. Cb# [phone] >> Sep 28, 2020 11:18 AM Tessa Lerner A wrote: Patient's wife has made an additional call regarding the matter  Please contact further when possible  Would pt need apt for this?

## 2020-09-29 NOTE — Telephone Encounter (Signed)
Patient's wife [name] was made aware of Jolene's recommendations. [name] verbalized understanding and has no further questions.

## 2020-10-05 ENCOUNTER — Telehealth: Payer: Self-pay

## 2020-10-05 NOTE — Telephone Encounter (Signed)
Copied from [place] [id number]. Topic: Referral - Request for Referral >> Oct 05, 2020 11:45 AM Oneta Rack wrote: [name] would like PCP to intervene because [facility] keeps postponing epidermal inclusion cyst removal test.is scheduled for the end of August. Patient is in pain and spouse would like a follow up call from PCP or nurse [phone] or [phone]

## 2020-10-05 NOTE — Telephone Encounter (Signed)
There isnt anything we can do for him right?

## 2020-10-06 NOTE — Telephone Encounter (Signed)
Patient notified.

## 2020-10-13 ENCOUNTER — Other Ambulatory Visit: Payer: Self-pay | Admitting: Nurse Practitioner

## 2020-10-13 DIAGNOSIS — R062 Wheezing: Secondary | ICD-10-CM

## 2020-10-14 ENCOUNTER — Telehealth: Payer: Self-pay

## 2020-10-17 ENCOUNTER — Telehealth: Payer: Self-pay | Admitting: Licensed Clinical Social Worker

## 2020-10-17 NOTE — Telephone Encounter (Signed)
Clinical Social Work  Care Management   Phone Outreach    10/17/2020 Name: [name]. MRN: [id number] DOB: [birth year]  [name] is a 60 y.o. year old male who is a primary care patient of Cannady, Barbaraann Faster, NP .   F/U phone call today to assess needs, progress and barriers with care plan goals.   Telephone outreach was unsuccessful A HIPPA compliant phone message was left for the patient providing contact information and requesting a return call.   Plan:CCM LCSW will wait for return call. If no return call is received, Will route chart to Care Guide to see if patient would like to reschedule phone appointment   Review of patient status, including review of consultants reports, relevant laboratory and other test results, and collaboration with appropriate care team members and the patient's provider was performed as part of comprehensive patient evaluation and provision of care management services.     Christa See, MSW, [email or web address] Phone [phone] 10:07 AM

## 2020-10-21 ENCOUNTER — Telehealth: Payer: Self-pay

## 2020-10-21 NOTE — Chronic Care Management (AMB) (Signed)
  Care Management   Note  10/21/2020 Name: [name]. MRN: [id number] DOB: [birth year]  [name]. is a 60 y.o. year old male who is a primary care patient of [name], NP and is actively engaged with the care management team. I reached out to [facility]. by phone today to assist with re-scheduling a follow up visit with the Licensed Clinical Social Worker  Follow up plan: Unsuccessful telephone outreach attempt made.  The care management team will reach out to the patient again over the next 7 days.  If patient returns call to provider office, please advise to call [facility]  at [place], [place], [place], [place] [address] Direct Dial: [phone] [email or web address] Website: [email or web address]

## 2020-10-24 ENCOUNTER — Ambulatory Visit (INDEPENDENT_AMBULATORY_CARE_PROVIDER_SITE_OTHER): Payer: Medicare Other | Admitting: General Practice

## 2020-10-24 DIAGNOSIS — E1159 Type 2 diabetes mellitus with other circulatory complications: Secondary | ICD-10-CM | POA: Diagnosis not present

## 2020-10-24 DIAGNOSIS — I152 Hypertension secondary to endocrine disorders: Secondary | ICD-10-CM | POA: Diagnosis not present

## 2020-10-24 DIAGNOSIS — R809 Proteinuria, unspecified: Secondary | ICD-10-CM | POA: Diagnosis not present

## 2020-10-24 DIAGNOSIS — E1165 Type 2 diabetes mellitus with hyperglycemia: Secondary | ICD-10-CM

## 2020-10-24 DIAGNOSIS — E1129 Type 2 diabetes mellitus with other diabetic kidney complication: Secondary | ICD-10-CM | POA: Diagnosis not present

## 2020-10-24 NOTE — Chronic Care Management (AMB) (Signed)
Chronic Care Management   CCM RN Visit Note  10/24/2020 Name: [name]. MRN: [id number] DOB: 1960-11-16  Subjective: [name]. is a 60 y.o. year old male who is a primary care patient of Cannady, Barbaraann Faster, NP. The care management team was consulted for assistance with disease management and care coordination needs.    Engaged with patient by telephone for follow up visit in response to provider referral for case management and/or care coordination services.   Consent to Services:  The patient was given information about Chronic Care Management services, agreed to services, and gave verbal consent prior to initiation of services.  Please see initial visit note for detailed documentation.   Patient agreed to services and verbal consent obtained.   Assessment: Review of patient past medical history, allergies, medications, health status, including review of consultants reports, laboratory and other test data, was performed as part of comprehensive evaluation and provision of chronic care management services.   SDOH (Social Determinants of Health) assessments and interventions performed:    CCM Care Plan  Allergies  Allergen Reactions   Bee Venom Hives    All kinds of bees   Other     Certain powders   Sulfa Antibiotics     Outpatient Encounter Medications as of 10/24/2020  Medication Sig   aspirin 81 MG EC tablet Take 1 tablet (81 mg total) by mouth daily.   atorvastatin (LIPITOR) 80 MG tablet TAKE 1 TABLET BY MOUTH ONCE DAILY   Blood Glucose Monitoring Suppl (ONE TOUCH ULTRA 2) w/Device KIT Use to check blood sugar 4 times a day   BREO ELLIPTA 100-25 MCG/INH AEPB INHALE 1 PUFF BY MOUTH ONCE DAILY   Cholecalciferol 1.25 MG (50000 UT) TABS Take 1 tablet by mouth once a week. For 8 weeks and then stop.  Return to office for lab draw.   clobetasol cream (TEMOVATE) 6.14 % Apply 1 application topically 2 (two) times daily. Apply to affected area   clopidogrel  (PLAVIX) 75 MG tablet Take 1 tablet by mouth daily.   EPINEPHrine 0.3 mg/0.3 mL IJ SOAJ injection Inject 0.3 mg into the muscle as needed for anaphylaxis.   fenofibrate 54 MG tablet TAKE 1 TABLET BY MOUTH ONCE DAILY   gabapentin (NEURONTIN) 300 MG capsule TAKE 1 CAPSULE BY MOUTH TWICE DAILY   glucose blood (ONETOUCH ULTRA) test strip To check blood sugar three times daily   insulin degludec (TRESIBA FLEXTOUCH) 100 UNIT/ML FlexTouch Pen INJECT 52 UNITS SUBQ ONCE DAILY   Insulin Pen Needle (PEN NEEDLES) 32G X 4 MM MISC 1 kit by Does not apply route daily.   JARDIANCE 25 MG TABS tablet TAKE 1 TABLET BY MOUTH ONCE DAILY   Lancets (ONETOUCH ULTRASOFT) lancets Use as instructed   lisinopril (ZESTRIL) 20 MG tablet TAKE 1 TABLET BY MOUTH ONCE DAILY   Misc. Devices (PULSE OXIMETER FOR FINGER) MISC To check O2 saturations once daily with asthma and document + check if any shortness of breath   montelukast (SINGULAIR) 10 MG tablet TAKE 1 TABLET BY MOUTH AT BEDTIME   nitroGLYCERIN (NITROSTAT) 0.4 MG SL tablet Place 1 tablet (0.4 mg total) under the tongue every 5 (five) minutes as needed for chest pain.   omeprazole (PRILOSEC) 20 MG capsule TAKE 1 CAPSULE BY MOUTH ONCE DAILY   polyethylene glycol (MIRALAX) 17 g packet Take 17 g by mouth daily.   tiZANidine (ZANAFLEX) 2 MG tablet Take 1 tablet (2 mg total) by mouth every 6 (six)  hours as needed for muscle spasms.   trimethoprim-polymyxin b (POLYTRIM) ophthalmic solution Place 1 drop into the right eye every 6 (six) hours.    ULTRACARE PEN NEEDLES 32G X 5 MM MISC    Vitamin D, Ergocalciferol, (DRISDOL) 1.25 MG (50000 UNIT) CAPS capsule TAKE 1 CAPSULE BY MOUTH ONCE A WEEK   No facility-administered encounter medications on file as of 10/24/2020.    Patient Active Problem List   Diagnosis Date Noted   Heart murmur 09/21/2020   Carotid artery bruit 09/21/2020   Type 2 diabetes mellitus with proteinuria ([place]) 03/25/2019   Atherosclerosis of both carotid  arteries 03/22/2019   History of CVA (cerebrovascular accident) 03/06/2019   Slow transit constipation    Uncontrolled type 2 diabetes mellitus with hyperglycemia ([place])    Obesity 09/03/2018   Frequent falls 05/15/2016   Vitamin D deficiency 08/22/2015   Carpal tunnel syndrome on left 05/12/2015   External hemorrhoids 12/20/2014   Allergic rhinitis 10/21/2014   Diverticulosis 10/21/2014   Hypertension associated with diabetes ([place]) 10/21/2014   Hyperlipidemia associated with type 2 diabetes mellitus ([place]) 10/21/2014   Asthma 10/21/2014   GERD (gastroesophageal reflux disease) 10/21/2014   NAFLD (nonalcoholic fatty liver disease) 10/21/2014   Arthritis 10/21/2014   History of pancreatitis 10/07/2014   Phenylketonuria (PKU) ([place]) 10/07/2014   Renal cyst, left 10/07/2014   Gall bladder polyp 10/01/2013    Conditions to be addressed/monitored:HTN and DMII  Care Plan : RNCM: Diabetes Type 2 (Adult)  Updates made by Vanita Ingles since 10/24/2020 12:00 AM     Problem: RNCM: Glycemic Management (Diabetes, Type 2)   Priority: High     Long-Range Goal: RNCM: Glycemic Management Optimized   Start Date: 04/13/2020  Expected End Date: 10/19/2021  This Visit's Progress: On track  Priority: High  Note:   Objective:  Lab Results  Component Value Date   HGBA1C 9.9 (H) 09/21/2020    Lab Results  Component Value Date   CREATININE 1.03 06/01/2020   CREATININE 1.26 05/23/2020   CREATININE 1.21 02/24/2020    No results found for: EGFR Current Barriers:  Knowledge Deficits related to basic Diabetes pathophysiology and self care/management Knowledge Deficits related to medications used for management of diabetes Financial Constraints Literacy barriers Cognitive Deficits Does not use cbg meter  Limited Social Support Unable to independently manage DM Does not adhere to provider recommendations re: Heart healthy/ADA Does not adhere to prescribed medication regimen Lacks social  connections Does not contact provider office for questions/concerns Case Manager Clinical Goal(s):  Collaboration with [name], NP regarding development and update of comprehensive plan of care as evidenced by provider attestation and co-signature Inter-disciplinary care team collaboration (see longitudinal plan of care) patient will demonstrate improved adherence to prescribed treatment plan for diabetes self care/management as evidenced by:  daily monitoring and recording of MAU:[id number] to ADA/ carb modified diet. exercise 4/5 days/week adherence to prescribed medication regimen- 10-24-2020: States compliance with medications regimen.  Interventions:  Provided education to patient about basic DM disease process Reviewed medications with patient and discussed importance of medication adherence Discussed plans with patient for ongoing care management follow up and provided patient with direct contact information for care management team Provided patient with written educational materials related to hypo and hyperglycemia and importance of correct treatment. 10-24-2020: Review and the patient denies any lows at this time. Will continue to monitor and educate  Advised patient, providing education and rationale, to check cbg BID and record, calling pcp for  findings outside established parameters.   10-24-2020: Review of the patients readings. The patients wife reports he is doing better but still having high readings at times. Range 100 to 300 per wife today Review of patient status, including review of consultants reports, relevant laboratory and other test results, and medications completed. Review of dietary restrictions: 10-24-2020: Review of heart healthy/ADA diet. The patient is drinking water and using flavor packs to flavor his water. He also is trying to eat healthier snacks.  Patient Goals/Self-Care Activities Over the next 120 days, patient will:  - UNABLE to independently manage  DM Self administers oral medications as prescribed Attends all scheduled provider appointments Checks blood sugars as prescribed and utilize hyper and hypoglycemia protocol as needed Adheres to prescribed ADA/carb modified - barriers to adherence to treatment plan identified - blood glucose monitoring encouraged - blood glucose readings reviewed - individualized medical nutrition therapy provided - mutual A1C goal set or reviewed - resources required to improve adherence to care identified - self-awareness of signs/symptoms of hypo or hyperglycemia encouraged - use of blood glucose monitoring log promoted Follow Up Plan: Telephone follow up appointment with care management team member scheduled for: 01-03-2021 at 1:45pm    Task: RNCM: Alleviate Barriers to Glycemic Management Completed 10/24/2020  Outcome: Positive  Note:   Care Management Activities:    - barriers to adherence to treatment plan identified - blood glucose monitoring encouraged - blood glucose readings reviewed - individualized medical nutrition therapy provided - mutual A1C goal set or reviewed - resources required to improve adherence to care identified - self-awareness of signs/symptoms of hypo or hyperglycemia encouraged - use of blood glucose monitoring log promoted        Care Plan : RNCM: Hypertension (Adult)  Updates made by Vanita Ingles since 10/24/2020 12:00 AM     Problem: RNCM: Hypertension (Hypertension)   Priority: Medium     Long-Range Goal: RNCM: Hypertension Monitored   Start Date: 04/13/2020  Expected End Date: 10/19/2021  This Visit's Progress: On track  Priority: Medium  Note:   Objective:  Last practice recorded BP readings:  BP Readings from Last 3 Encounters:  09/21/20 106/64  08/10/20 126/62  08/03/20 128/74    Most recent eGFR/CrCl: No results found for: EGFR  No components found for: CRCL Current Barriers:  Knowledge Deficits related to basic understanding of hypertension  pathophysiology and self care management Knowledge Deficits related to understanding of medications prescribed for management of hypertension Non-adherence to prescribed medication regimen Literacy barriers Cognitive Deficits Limited Social Support Unable to independently manage HTN Unable to self administer medications as prescribed Does not adhere to provider recommendations re: refraining from salt use in diet Lacks social connections Does not contact provider office for questions/concerns Case Manager Clinical Goal(s):  patient will verbalize understanding of plan for hypertension management patient will attend all scheduled medical appointments: several upcoming appointments with specialist   patient will demonstrate improved adherence to prescribed treatment plan for hypertension as evidenced by taking all medications as prescribed, monitoring and recording blood pressure as directed, adhering to low sodium/DASH diet  patient will demonstrate improved health management independence as evidenced by checking blood pressure as directed and notifying PCP if SBP>160 or DBP > 90, taking all medications as prescribe, and adhering to a low sodium diet as discussed. Interventions:  Collaboration with [name], NP regarding development and update of comprehensive plan of care as evidenced by provider attestation and co-signature Inter-disciplinary care team collaboration (see longitudinal plan  of care) Evaluation of current treatment plan related to hypertension self management and patient's adherence to plan as established by provider.10-24-2020: Per the patients wife the patient has been making healthier choices and not using as much salt. He also is not eating bags of potato chips. She has him eating vegetarian chips without salt. Praised for [employer]. Will continue to monitor.  Provided education to patient re: stroke prevention, s/s of heart attack and stroke, DASH diet,  complications of uncontrolled blood pressure. 10-24-2020: Per wife they have not been able to follow up with the cardiologist for an ECHO. The patients wife has had issues with her own health. The patients wife states he also is going to have to have a procedure on his knee because he has an eight cm cyst on it. Will continue to monitor.  Reviewed medications with patient and discussed importance of compliance. 10-24-2020: States compliance to medications.  Discussed plans with patient for ongoing care management follow up and provided patient with direct contact information for care management team Advised patient, providing education and rationale, to monitor blood pressure daily and record, calling PCP for findings outside established parameters.  Patient Goals/Self-Care Activities  patient will:  - UNABLE to independently manage HTN Self administers medications as prescribed Attends all scheduled provider appointments Calls provider office for new concerns, questions, or BP outside discussed parameters Checks BP and records as discussed Follows a low sodium diet/DASH diet - blood pressure trends reviewed - depression screen reviewed - home or ambulatory blood pressure monitoring encouraged Follow Up Plan: Telephone follow up appointment with care management team member scheduled for: 01-03-2021 at 1:45 pm    Task: RNCM: Identify and Monitor Blood Pressure Elevation Completed 10/24/2020  Outcome: Positive  Note:   Care Management Activities:    - blood pressure trends reviewed - depression screen reviewed - home or ambulatory blood pressure monitoring encouraged         Plan:Telephone follow up appointment with care management team member scheduled for:  01-03-2021 at 1:45 pm  Noreene Larsson RN, MSN, [facility] Mobile: [phone]

## 2020-10-24 NOTE — Patient Instructions (Signed)
Visit Information  PATIENT GOALS:  Goals Addressed             This Visit's Progress    RNCM: Lifestyle Change-Hypertension       Timeframe:  Long-Range Goal Priority:  High Start Date:       04-13-2020                      Expected End Date:     04-13-2021                  Follow Up Date 01-03-2021   - agree to work together to make changes - ask questions to understand - have a family meeting to talk about healthy habits - learn about high blood pressure   -limit sodium intake  Why is this important?   The changes that you are asked to make may be hard to do.  This is especially true when the changes are life-long.  Knowing why it is important to you is the first step.  Working on the change with your family or support person helps you not feel alone.  Reward yourself and family or support person when goals are met. This can be an activity you choose like bowling, hiking, biking, swimming or shooting hoops.     Notes: Per the patients wife the patient is using a lot of salt. 10-24-2020: The patients wife states that the patient is doing better with salt consumption. She has him switched from potato chips to vegetable chips without salt. She states he is really trying to do better.      RNCM: Monitor and Manage My Blood Sugar-Diabetes Type 2       Timeframe:  Long-Range Goal Priority:  High Start Date:         04-13-2020                    Expected End Date:       04-17-2021                Follow Up Date 01-03-2021    - check blood sugar at prescribed times - check blood sugar before and after exercise - check blood sugar if I feel it is too high or too low - enter blood sugar readings and medication or insulin into daily log - take the blood sugar log to all doctor visits - take the blood sugar meter to all doctor visits    Why is this important?   Checking your blood sugar at home helps to keep it from getting very high or very low.  Writing the results in a diary or  log helps the doctor know how to care for you.  Your blood sugar log should have the time, date and the results.  Also, write down the amount of insulin or other medicine that you take.  Other information, like what you ate, exercise done and how you were feeling, will also be helpful.     Notes: Education on fasting blood sugars <130 and post prandial <180.  The patient has had some readings 300-500. 10-24-2020: Per his wife the patient has had blood sugars in the 100 to 300 range. The patient had a lower hemoglobin A1C in July but still not at target. Education and support given.      RNCM: Obtain Eye Exam-Diabetes Type 2       Follow Up Date 01-03-2021   - keep appointment with eye doctor  Has upcoming appointment with the eye doctor on 04-22-2020   Why is this important?   Eye check-ups are important when you have diabetes.  Vision loss can be prevented.    10-24-2020: Reminded the patient about eye exam. His wife states they are behind on several appointments due to her health issues and he is having surgery on his knee. Will continue to monitor.         The patient verbalized understanding of instructions, educational materials, and care plan provided today and declined offer to receive copy of patient instructions, educational materials, and care plan.   Telephone follow up appointment with care management team member scheduled for: 01-03-2021 at 1:45 pm  [place], MSN, [facility] Mobile: [phone]

## 2020-10-26 ENCOUNTER — Ambulatory Visit: Payer: Medicare Other | Admitting: Nurse Practitioner

## 2020-10-26 ENCOUNTER — Telehealth: Payer: Self-pay

## 2020-10-26 NOTE — Chronic Care Management (AMB) (Signed)
Chronic Care Management Pharmacy Assistant   Name: [name].  MRN: [id number] DOB: August 20, 1960   Reason for Encounter: Disease State General     Recent office visits:  09/21/20-Alex T. Ned Card, NP (PCP) General follow up. Recommend using Albuterol as needed only if episodes of SOB present. Increase Tresiba to 60 units QHS, if fasting BS remain >130 in 3 days then increase by 2 units. Follow up in 3 months.  Recent consult visits:  09/27/20-[name], DM (Dermatology) Seen for a cyst. Return for surgery. 08/03/20-Alex Raeanne Gathers, MD (Gastroenterologist) Initial visit. Follow up prn. Hospital visits:  None in previous 6 months  Medications: Outpatient Encounter Medications as of 10/26/2020  Medication Sig   aspirin 81 MG EC tablet Take 1 tablet (81 mg total) by mouth daily.   atorvastatin (LIPITOR) 80 MG tablet TAKE 1 TABLET BY MOUTH ONCE DAILY   Blood Glucose Monitoring Suppl (ONE TOUCH ULTRA 2) w/Device KIT Use to check blood sugar 4 times a day   BREO ELLIPTA 100-25 MCG/INH AEPB INHALE 1 PUFF BY MOUTH ONCE DAILY   Cholecalciferol 1.25 MG (50000 UT) TABS Take 1 tablet by mouth once a week. For 8 weeks and then stop.  Return to office for lab draw.   clobetasol cream (TEMOVATE) 9.52 % Apply 1 application topically 2 (two) times daily. Apply to affected area   clopidogrel (PLAVIX) 75 MG tablet Take 1 tablet by mouth daily.   EPINEPHrine 0.3 mg/0.3 mL IJ SOAJ injection Inject 0.3 mg into the muscle as needed for anaphylaxis.   fenofibrate 54 MG tablet TAKE 1 TABLET BY MOUTH ONCE DAILY   gabapentin (NEURONTIN) 300 MG capsule TAKE 1 CAPSULE BY MOUTH TWICE DAILY   glucose blood (ONETOUCH ULTRA) test strip To check blood sugar three times daily   insulin degludec (TRESIBA FLEXTOUCH) 100 UNIT/ML FlexTouch Pen INJECT 52 UNITS SUBQ ONCE DAILY   Insulin Pen Needle (PEN NEEDLES) 32G X 4 MM MISC 1 kit by Does not apply route daily.   JARDIANCE 25 MG TABS tablet TAKE 1  TABLET BY MOUTH ONCE DAILY   Lancets (ONETOUCH ULTRASOFT) lancets Use as instructed   lisinopril (ZESTRIL) 20 MG tablet TAKE 1 TABLET BY MOUTH ONCE DAILY   Misc. Devices (PULSE OXIMETER FOR FINGER) MISC To check O2 saturations once daily with asthma and document + check if any shortness of breath   montelukast (SINGULAIR) 10 MG tablet TAKE 1 TABLET BY MOUTH AT BEDTIME   nitroGLYCERIN (NITROSTAT) 0.4 MG SL tablet Place 1 tablet (0.4 mg total) under the tongue every 5 (five) minutes as needed for chest pain.   omeprazole (PRILOSEC) 20 MG capsule TAKE 1 CAPSULE BY MOUTH ONCE DAILY   polyethylene glycol (MIRALAX) 17 g packet Take 17 g by mouth daily.   tiZANidine (ZANAFLEX) 2 MG tablet Take 1 tablet (2 mg total) by mouth every 6 (six) hours as needed for muscle spasms.   trimethoprim-polymyxin b (POLYTRIM) ophthalmic solution Place 1 drop into the right eye every 6 (six) hours.    ULTRACARE PEN NEEDLES 32G X 5 MM MISC    Vitamin D, Ergocalciferol, (DRISDOL) 1.25 MG (50000 UNIT) CAPS capsule TAKE 1 CAPSULE BY MOUTH ONCE A WEEK   No facility-administered encounter medications on file as of 10/26/2020.   Have you had any problems recently with your health? Patient wife states he has not problems with his health a this time.   Have you had any problems with your pharmacy? Patient wife states  he has no problems with his pharmacy.  What issues or side effects are you having with your medications? Patients wife states he has no issus or side effects to his medications.  What would you like me to pass along to [name],CPP for them to help you with?  Patient's wife states the patient is scheduled for surgery in September due to a cyst removal of his leg.  What can we do to take care of you better? Patient's wife states there is nothing at this time.   Star Rating Drugs: Atorvastatin 80 mg Last filled:10/13/20 90 DS Lisinopril 20 mg Last filled:10/13/20 90 DS Jardiance 25 mg Last  filled:10/13/20 30 DS Tresiba 100 units Last filled:10/13/20 30 DS  [name], [place]

## 2020-11-01 NOTE — Chronic Care Management (AMB) (Signed)
  Care Management   Note  11/01/2020 Name: [name]. MRN: [id number] DOB: [birth year]  Alex Metro. is a 60 y.o. year old male who is a primary care patient of [name], NP and is actively engaged with the care management team. I reached out to [facility]. by phone today to assist with re-scheduling a follow up visit with the Licensed Clinical Social Worker  Follow up plan: Telephone appointment with care management team member scheduled for:11/25/2020  Noreene Larsson, [place], [place] Management  [place],  [id number] Direct Dial: [phone] [email or web address] Website: [email or web address]

## 2020-11-01 NOTE — Telephone Encounter (Signed)
Alvin Critchley  I reached out to patient to reschedule and wife states that they are having a hard time financially. They are receiving food from a ministry, but they are having a hard time affording to go to doctor. Wanted to see if community resource could reach out.  Thank you   Noreene Larsson, [place], [place], [place] [address] Direct Dial: [phone] [email or web address] Website: [email or web address]

## 2020-11-14 ENCOUNTER — Encounter: Payer: Medicare Other | Admitting: Dermatology

## 2020-11-16 ENCOUNTER — Ambulatory Visit: Payer: Self-pay | Admitting: Licensed Clinical Social Worker

## 2020-11-16 DIAGNOSIS — E1159 Type 2 diabetes mellitus with other circulatory complications: Secondary | ICD-10-CM

## 2020-11-16 DIAGNOSIS — Z5941 Food insecurity: Secondary | ICD-10-CM

## 2020-11-16 DIAGNOSIS — E1129 Type 2 diabetes mellitus with other diabetic kidney complication: Secondary | ICD-10-CM | POA: Diagnosis not present

## 2020-11-16 DIAGNOSIS — R809 Proteinuria, unspecified: Secondary | ICD-10-CM

## 2020-11-16 DIAGNOSIS — F4321 Adjustment disorder with depressed mood: Secondary | ICD-10-CM | POA: Diagnosis not present

## 2020-11-16 DIAGNOSIS — I152 Hypertension secondary to endocrine disorders: Secondary | ICD-10-CM

## 2020-11-16 DIAGNOSIS — E1165 Type 2 diabetes mellitus with hyperglycemia: Secondary | ICD-10-CM | POA: Diagnosis not present

## 2020-11-18 ENCOUNTER — Other Ambulatory Visit: Payer: Self-pay | Admitting: Nurse Practitioner

## 2020-11-18 NOTE — Telephone Encounter (Signed)
Requested medications are due for refill today NO  Requested medications are on the active medication list NO  Last visit 09/2020  Future visit scheduled 12/2020  Notes to clinic Not on current med list, please assess.

## 2020-11-22 DIAGNOSIS — H25043 Posterior subcapsular polar age-related cataract, bilateral: Secondary | ICD-10-CM | POA: Diagnosis not present

## 2020-11-22 DIAGNOSIS — H1789 Other corneal scars and opacities: Secondary | ICD-10-CM | POA: Diagnosis not present

## 2020-11-22 DIAGNOSIS — H04123 Dry eye syndrome of bilateral lacrimal glands: Secondary | ICD-10-CM | POA: Diagnosis not present

## 2020-11-22 DIAGNOSIS — E113293 Type 2 diabetes mellitus with mild nonproliferative diabetic retinopathy without macular edema, bilateral: Secondary | ICD-10-CM | POA: Diagnosis not present

## 2020-11-22 DIAGNOSIS — H2513 Age-related nuclear cataract, bilateral: Secondary | ICD-10-CM | POA: Diagnosis not present

## 2020-11-22 LAB — HM DIABETES EYE EXAM

## 2020-11-23 NOTE — Patient Instructions (Signed)
Visit Information   Goals Addressed               This Visit's Progress     Patient Stated     SW-"I want to improve my health and self-care" (pt-stated)   On track     Timeframe:  Long-Range Goal Priority:  Medium  Start Date:  05/16/20                         Expected End Date:  01/16/21                    Follow Up Date- 11/25/20  Patient Self Care Activities:  Attend all scheduled provider appointments Call provider office for new concerns or questions Continue to utilize healthy coping skills to assist in management of symptoms        Patient verbalizes understanding of instructions provided today.   Telephone follow up appointment with care management team member scheduled for:11/25/20  Christa See, MSW, [email or web address] Phone [phone] 9:47 AM

## 2020-11-23 NOTE — Chronic Care Management (AMB) (Signed)
Chronic Care Management    Clinical Social Work Note  11/23/2020 Name: Zafar Debrosse. MRN: [id number] DOB: March 08, 1961  [name] is a 60 y.o. year old male who is a primary care patient of Cannady, Barbaraann Faster, NP. The CCM team was consulted to assist the patient with chronic disease management and/or care coordination needs related to: [name] , Grief Counseling, and Financial Difficulties related to rent/clothing/medical bills .   Engaged with patient's spouse by telephone for follow up visit in response to provider referral for social work chronic care management and care coordination services.   Consent to Services:  The patient was given information about Chronic Care Management services, agreed to services, and gave verbal consent prior to initiation of services.  Please see initial visit note for detailed documentation.   Patient agreed to services and consent obtained.   Consent to Services:  The patient was given information about Care Management services, agreed to services, and gave verbal consent prior to initiation of services.  Please see initial visit note for detailed documentation.   Patient agreed to services today and consent obtained.   Assessment: Engaged with patient's spouse by phone in response to provider referral for social work care coordination services: [name] , Grief Counseling, and Financial Difficulties related to rent/clothing/medical bills .    Patient continues to maintain positive progress with care plan goals. Family reports continued financial strain; however, utilizes local community resources to assist. Strategies to reduce stress management identified. See Care Plan below for interventions and patient self-care activities.  Recent life changes or stressors: Financial strain/Grief  Recommendation: Patient may benefit from, and is in agreement work with LCSW to address care coordination needs and will continue  to work with the clinical team to address health care and disease management related needs.   Follow up Plan: Patient would like continued follow-up from CCM LCSW .  per patient's request will follow up in 11/25/20.  Will call office if needed prior to next encounter.     SDOH (Social Determinants of Health) assessments and interventions performed:    Advanced Directives Status: Not addressed in this encounter.  CCM Care Plan  Allergies  Allergen Reactions   Bee Venom Hives    All kinds of bees   Other     Certain powders   Sulfa Antibiotics     Outpatient Encounter Medications as of 11/16/2020  Medication Sig   aspirin 81 MG EC tablet Take 1 tablet (81 mg total) by mouth daily.   Blood Glucose Monitoring Suppl (ONE TOUCH ULTRA 2) w/Device KIT Use to check blood sugar 4 times a day   Cholecalciferol 1.25 MG (50000 UT) TABS Take 1 tablet by mouth once a week. For 8 weeks and then stop.  Return to office for lab draw.   clobetasol cream (TEMOVATE) 8.75 % Apply 1 application topically 2 (two) times daily. Apply to affected area   clopidogrel (PLAVIX) 75 MG tablet Take 1 tablet by mouth daily.   EPINEPHrine 0.3 mg/0.3 mL IJ SOAJ injection Inject 0.3 mg into the muscle as needed for anaphylaxis.   gabapentin (NEURONTIN) 300 MG capsule TAKE 1 CAPSULE BY MOUTH TWICE DAILY   glucose blood (ONETOUCH ULTRA) test strip To check blood sugar three times daily   insulin degludec (TRESIBA FLEXTOUCH) 100 UNIT/ML FlexTouch Pen INJECT 52 UNITS SUBQ ONCE DAILY   Insulin Pen Needle (PEN NEEDLES) 32G X 4 MM MISC 1 kit by Does not apply route daily.  Lancets (ONETOUCH ULTRASOFT) lancets Use as instructed   Misc. Devices (PULSE OXIMETER FOR FINGER) MISC To check O2 saturations once daily with asthma and document + check if any shortness of breath   montelukast (SINGULAIR) 10 MG tablet TAKE 1 TABLET BY MOUTH AT BEDTIME   nitroGLYCERIN (NITROSTAT) 0.4 MG SL tablet Place 1 tablet (0.4 mg total) under the  tongue every 5 (five) minutes as needed for chest pain.   omeprazole (PRILOSEC) 20 MG capsule TAKE 1 CAPSULE BY MOUTH ONCE DAILY   polyethylene glycol (MIRALAX) 17 g packet Take 17 g by mouth daily.   tiZANidine (ZANAFLEX) 2 MG tablet Take 1 tablet (2 mg total) by mouth every 6 (six) hours as needed for muscle spasms.   trimethoprim-polymyxin b (POLYTRIM) ophthalmic solution Place 1 drop into the right eye every 6 (six) hours.    ULTRACARE PEN NEEDLES 32G X 5 MM MISC    Vitamin D, Ergocalciferol, (DRISDOL) 1.25 MG (50000 UNIT) CAPS capsule TAKE 1 CAPSULE BY MOUTH ONCE A WEEK   [DISCONTINUED] atorvastatin (LIPITOR) 80 MG tablet TAKE 1 TABLET BY MOUTH ONCE DAILY   [DISCONTINUED] BREO ELLIPTA 100-25 MCG/INH AEPB INHALE 1 PUFF BY MOUTH ONCE DAILY   [DISCONTINUED] fenofibrate 54 MG tablet TAKE 1 TABLET BY MOUTH ONCE DAILY   [DISCONTINUED] JARDIANCE 25 MG TABS tablet TAKE 1 TABLET BY MOUTH ONCE DAILY   [DISCONTINUED] lisinopril (ZESTRIL) 20 MG tablet TAKE 1 TABLET BY MOUTH ONCE DAILY   No facility-administered encounter medications on file as of 11/16/2020.    Patient Active Problem List   Diagnosis Date Noted   Heart murmur 09/21/2020   Carotid artery bruit 09/21/2020   Type 2 diabetes mellitus with proteinuria ([place]) 03/25/2019   Atherosclerosis of both carotid arteries 03/22/2019   History of CVA (cerebrovascular accident) 03/06/2019   Slow transit constipation    Uncontrolled type 2 diabetes mellitus with hyperglycemia ([place])    Obesity 09/03/2018   Frequent falls 05/15/2016   Vitamin D deficiency 08/22/2015   Carpal tunnel syndrome on left 05/12/2015   External hemorrhoids 12/20/2014   Allergic rhinitis 10/21/2014   Diverticulosis 10/21/2014   Hypertension associated with diabetes ([place]) 10/21/2014   Hyperlipidemia associated with type 2 diabetes mellitus ([place]) 10/21/2014   Asthma 10/21/2014   GERD (gastroesophageal reflux disease) 10/21/2014   NAFLD (nonalcoholic fatty liver disease)  10/21/2014   Arthritis 10/21/2014   History of pancreatitis 10/07/2014   Phenylketonuria (PKU) ([place]) 10/07/2014   Renal cyst, left 10/07/2014   Gall bladder polyp 10/01/2013    Conditions to be addressed/monitored:  Stress ; Financial constraints related to rent/clothing/medical bills, Limited social support, Limited access to food, ADL IADL limitations, and Literacy concerns  Care Plan : General Social Work (Adult)  Updates made by Rebekah Chesterfield, LCSW since 11/23/2020 12:00 AM     Problem: Coping Skills (General Plan of Care)      Long-Range Goal: Coping Skills Enhanced   Start Date: 05/16/2020  This Visit's Progress: On track  Recent Progress: On track  Priority: Medium  Note:   Timeframe:  Long-Range Goal Priority:  Medium  Start Date:  05/16/20                         Expected End Date:  01/16/21                  Follow Up Date- 11/25/20  Current Barriers:  Financial constraints Limited social support Level of care concerns ADL  IADL limitations Literacy concerns Cognitive Deficits Lacks knowledge of community resource: available financial support resources within the area as well as socialization opportunities within the area Grief support related to the recent loss of his mother Clinical Social Work Clinical Goal(s):  Over the next 120 days, client will work with SW to address concerns related to improving self-care and implementing it whenever able Interventions: Patient's spouse was interviewed in regards to patient and appropriate assessments performed Patient has been doing well managing his blood sugars and been staying in a good mood. Per spouse, patient is often dancing and singing around the house. The family have began to decorate for the upcoming holidays Patient experiences anxiety regarding upcoming procedure on knee scheduled next month. Concerned about limitations during the healing process (approx. 4-6 weeks) Spouse reports strain paying the increase in  rent, in addition, to medical bills. "It's been a struggle". Per patient, charity care denied assistance until bills are $5000 Patient's blood sugars have improved since he has been actively monitoring what he eats and drinks to promote health and well-being. His sleep has improved Patient has been mowing the yard and using exercise bike to promote activity Patient utilizes pantries to obtain food, when needed 08/31: Family receives assistance from local pantries and rescue missions every month. They are considering applying for Food Stamps and are interested in Christmas Cheer assistance-LCSW agreed to look into this Family receives strong support from friends who assist with clothing Strategies to assist with promoting mood and stress management was discussed. Family agreed to practice gratitude thinking on a routine basis CCM LCSW reviewed upcoming appointments  Discussed plans with patient for ongoing care management follow up and provided patient with direct contact information for care management team Advised patient's spouse to contact CCM embedded practice providers and PCP if any urgent concerns arise. Brief self-care education provided to spouse during outreach today. Positive reinforcement provided for positive self-care implementation.  Patient Self Care Activities:  Attend all scheduled provider appointments Call provider office for new concerns or questions Continue to utilize healthy coping skills to assist in management of symptoms       Christa See, MSW, [email or web address] Phone [phone] 9:44 AM

## 2020-11-25 ENCOUNTER — Telehealth: Payer: Medicare Other

## 2020-12-08 ENCOUNTER — Telehealth: Payer: Self-pay

## 2020-12-08 NOTE — Progress Notes (Signed)
Chronic Care Management Pharmacy Assistant   Name: [name].  MRN: [id number] DOB: 1960-05-08   Reason for Encounter: Disease State Diabetes   Recent office visits:  09/21/20 Alex Lick NP  - Seen for Diabetes - Labs ordered - increase Tresiba to 60 units QHS, if fasting BS remain >130 in 3 days then increase by 2 units - Follow up in 3 months   Recent consult visits:  09/27/20 Alex Patty MD - Seen for Epidermal inclusion cyst - No medication changes noted - Scheduled for surgery at [facility] - No follow up noted   Hospital visits:  None in previous 6 months  Medications: Outpatient Encounter Medications as of 12/08/2020  Medication Sig   aspirin 81 MG EC tablet Take 1 tablet (81 mg total) by mouth daily.   atorvastatin (LIPITOR) 80 MG tablet TAKE 1 TABLET BY MOUTH ONCE DAILY   Blood Glucose Monitoring Suppl (ONE TOUCH ULTRA 2) w/Device KIT Use to check blood sugar 4 times a day   BREO ELLIPTA 100-25 MCG/INH AEPB INHALE 1 PUFF BY MOUTH ONCE DAILY   Cholecalciferol 1.25 MG (50000 UT) TABS Take 1 tablet by mouth once a week. For 8 weeks and then stop.  Return to office for lab draw.   clobetasol cream (TEMOVATE) 6.96 % Apply 1 application topically 2 (two) times daily. Apply to affected area   clopidogrel (PLAVIX) 75 MG tablet Take 1 tablet by mouth daily.   diclofenac Sodium (VOLTAREN) 1 % GEL APPLY 2 GRAM TO AFFECTED AREA 3 TIMES DAILY   EPINEPHrine 0.3 mg/0.3 mL IJ SOAJ injection Inject 0.3 mg into the muscle as needed for anaphylaxis.   fenofibrate 54 MG tablet TAKE 1 TABLET BY MOUTH ONCE DAILY   gabapentin (NEURONTIN) 300 MG capsule TAKE 1 CAPSULE BY MOUTH TWICE DAILY   glucose blood (ONETOUCH ULTRA) test strip To check blood sugar three times daily   insulin degludec (TRESIBA FLEXTOUCH) 100 UNIT/ML FlexTouch Pen INJECT 52 UNITS SUBQ ONCE DAILY   Insulin Pen Needle (PEN NEEDLES) 32G X 4 MM MISC 1 kit by Does not apply route daily.   JARDIANCE 25  MG TABS tablet TAKE 1 TABLET BY MOUTH ONCE DAILY   Lancets (ONETOUCH ULTRASOFT) lancets Use as instructed   lisinopril (ZESTRIL) 20 MG tablet TAKE 1 TABLET BY MOUTH ONCE DAILY   Misc. Devices (PULSE OXIMETER FOR FINGER) MISC To check O2 saturations once daily with asthma and document + check if any shortness of breath   montelukast (SINGULAIR) 10 MG tablet TAKE 1 TABLET BY MOUTH AT BEDTIME   nitroGLYCERIN (NITROSTAT) 0.4 MG SL tablet Place 1 tablet (0.4 mg total) under the tongue every 5 (five) minutes as needed for chest pain.   omeprazole (PRILOSEC) 20 MG capsule TAKE 1 CAPSULE BY MOUTH ONCE DAILY   polyethylene glycol (MIRALAX) 17 g packet Take 17 g by mouth daily.   tiZANidine (ZANAFLEX) 2 MG tablet Take 1 tablet (2 mg total) by mouth every 6 (six) hours as needed for muscle spasms.   trimethoprim-polymyxin b (POLYTRIM) ophthalmic solution Place 1 drop into the right eye every 6 (six) hours.    ULTRACARE PEN NEEDLES 32G X 5 MM MISC    Vitamin D, Ergocalciferol, (DRISDOL) 1.25 MG (50000 UNIT) CAPS capsule TAKE 1 CAPSULE BY MOUTH ONCE A WEEK   No facility-administered encounter medications on file as of 12/08/2020.    Care Gaps N/a  Recent Relevant Labs: Lab Results  Component Value Date/Time  HGBA1C 9.9 (H) 09/21/2020 02:13 PM   HGBA1C 13.1 (H) 06/01/2020 03:09 PM   MICROALBUR 10 06/01/2020 03:09 PM   MICROALBUR 10 07/17/2019 10:24 AM   MICROALBUR 20 08/22/2015 05:04 PM    Kidney Function Lab Results  Component Value Date/Time   CREATININE 1.03 06/01/2020 03:16 PM   CREATININE 1.26 05/23/2020 04:15 PM   GFRNONAA 66 02/24/2020 04:22 PM   GFRAA 76 02/24/2020 04:22 PM    Current antihyperglycemic regimen:  Jardiance  25 mg  Tresiba to 60 units QHS, if fasting BS remain >130 in 3 days then increase by 2 units  What recent interventions/DTPs have been made to improve glycemic control:  Per provider note on 09/21/20:Poor tolerance to Metformin in past and allergic to Sulfa.  Would  avoid GLP at this time due to patient history of pancreatitis, concern this would flare.  Have there been any recent hospitalizations or ED visits since last visit with CPP? No Patient reports hypoglycemic symptoms, including Pale, Sweaty, and Shaky Patient reports hyperglycemic symptoms, including excessive thirst, polyuria  How often are you checking your blood sugar? 3-4 times daily What are your blood sugars ranging?  Morning 12/13/20 @ 204 Lunch 12/13/20 @ 188 Dinner 12/13/20 @ 106 During the week, how often does your blood glucose drop below 70? Never Are you checking your feet daily/regularly? No  Adherence Review: Is the patient currently on a STATIN medication? Yes Is the patient currently on ACE/ARB medication? Yes Does the patient have >5 day gap between last estimated fill dates? No  Misc. Comment: Spoke with patient's wife [name]. She states that patient had surgery yesterday for 3 double cysts so he is currently resting but the patient is able to hear and respond to some questions. Patient's wife states that one day within this past month [name] had a blood sugar drop of 99, looked pale, sweaty and shaky during the time and gave him some sweets to bring it back up. Patient states that his blood sugars usually run around 107-111.Patient's wife had some concerns about patient's medication for after surgery. She stated that PCP office advised her to call the clinic where the surgery was done. Patient would like to request medication Tresiba pens to be delivered and is able to wait until appointment with PCP in October that could change that process for him. [name] also stated that she applied for food stamps and is currently awaiting approval. Patient is scheduled for an appointment with [name] on December 12 @ 9:30 am.   Star Rating Drugs: Atorvastatin 80 mg last fill 11/18/20 90 DS Tresiba 100 unit last fill 09/21/20 Jardiance 25 mg last fill 11/18/20 30 DS Lisinopril 20  mg last fill 11/18/2288 DS    [name], CMA

## 2020-12-12 ENCOUNTER — Telehealth: Payer: Self-pay | Admitting: Nurse Practitioner

## 2020-12-12 ENCOUNTER — Ambulatory Visit: Payer: Medicare Other | Admitting: Dermatology

## 2020-12-12 ENCOUNTER — Other Ambulatory Visit: Payer: Self-pay

## 2020-12-12 DIAGNOSIS — D2372 Other benign neoplasm of skin of left lower limb, including hip: Secondary | ICD-10-CM | POA: Diagnosis not present

## 2020-12-12 DIAGNOSIS — D485 Neoplasm of uncertain behavior of skin: Secondary | ICD-10-CM

## 2020-12-12 NOTE — Progress Notes (Signed)
Follow-Up Visit   Subjective  [name]. is a 60 y.o. male who presents for the following: Cyst (Left lateral knee. Patient presents for excision.). Spot is very painful.  The following portions of the chart were reviewed this encounter and updated as appropriate:       Review of Systems:  No other skin or systemic complaints except as noted in HPI or Assessment and Plan.  Objective  Well appearing patient in no apparent distress; mood and affect are within normal limits.  A focused examination was performed including left knee. Relevant physical exam findings are noted in the Assessment and Plan.  left lateral knee 2.6cm tender firm bluish SQ nodule    Assessment & Plan  Neoplasm of uncertain behavior of skin left lateral knee  Skin excision  Lesion length (cm):  2.6 Lesion width (cm):  2.6 Margin per side (cm):  0.1 Total excision diameter (cm):  2.8 Informed consent: discussed and consent obtained   Timeout: patient name, date of birth, surgical site, and procedure verified   Procedure prep:  Patient was prepped and draped in usual sterile fashion Prep type:  Povidone-iodine Anesthesia: the lesion was anesthetized in a standard fashion   Anesthesia comment:  Total 12cc - 3cc lido w/epi, 9cc bupivicaine Anesthetic:  1% lidocaine w/ epinephrine 1-100,000 buffered w/ 8.4% NaHCO3 (0.5% bupivicaine) Instrument used: #15 blade   Hemostasis achieved with: pressure and electrodesiccation   Outcome: patient tolerated procedure well with no complications   Post-procedure details: sterile dressing applied and wound care instructions given   Dressing type: petrolatum and pressure dressing    Skin repair Complexity:  Intermediate Final length (cm):  1.8 Informed consent: discussed and consent obtained   Timeout: patient name, date of birth, surgical site, and procedure verified   Reason for type of repair: reduce tension to allow closure, reduce the risk of  dehiscence, infection, and necrosis, reduce subcutaneous dead space and avoid a hematoma, preserve normal anatomical and functional relationships and enhance both functionality and cosmetic results   Undermining: edges undermined   Subcutaneous layers (deep stitches):  Suture size:  3-0 Suture type: Vicryl (polyglactin 910)   Stitches:  Buried vertical mattress Fine/surface layer approximation (top stitches):  Suture size:  3-0 Suture type: nylon   Stitches: vertical mattress and simple interrupted   Suture removal (days):  7 Hemostasis achieved with: suture Outcome: patient tolerated procedure well with no complications   Post-procedure details: sterile dressing applied and wound care instructions given   Dressing type: pressure dressing (mupirocin)    Specimen 1 - Surgical pathology Differential Diagnosis: Cyst vs Hematoma/Varicose Vein Check Margins: No 2.5 tender firm bluish SQ nodule  Cyst vs Hematoma/Varicose Vein  Return in about 1 week (around 12/19/2020) for suture removal.  I, Jamesetta Orleans, CMA, am acting as scribe for [name], MD .  Documentation: I have reviewed the above documentation for accuracy and completeness, and I agree with the above.  [name] MD

## 2020-12-12 NOTE — Telephone Encounter (Signed)
Left message for patient to call back and schedule the Medicare Annual Wellness Visit (AWV) virtually or by telephone.  Last AWV 09/02/19  Please schedule at anytime with CFP-Nurse Health Advisor.  45 minute appointment  Any questions, please call me at [phone]

## 2020-12-12 NOTE — Patient Instructions (Signed)
Wound Care Instructions  Cleanse wound gently with soap and water once a day then pat dry with clean gauze. Apply a thing coat of Petrolatum (petroleum jelly, "Vaseline") over the wound (unless you have an allergy to this). We recommend that you use a new, sterile tube of Vaseline. Do not pick or remove scabs. Do not remove the yellow or white "healing tissue" from the base of the wound.  Cover the wound with fresh, clean, nonstick gauze and secure with paper tape. You may use Band-Aids in place of gauze and tape if the would is small enough, but would recommend trimming much of the tape off as there is often too much. Sometimes Band-Aids can irritate the skin.  You should call the office for your biopsy report after 1 week if you have not already been contacted.  If you experience any problems, such as abnormal amounts of bleeding, swelling, significant bruising, significant pain, or evidence of infection, please call the office immediately.  FOR ADULT SURGERY PATIENTS: If you need something for pain relief you may take 1 extra strength Tylenol (acetaminophen) AND 2 Ibuprofen (226m each) together every 4 hours as needed for pain. (do not take these if you are allergic to them or if you have a reason you should not take them.) Typically, you may only need pain medication for 1 to 3 days.

## 2020-12-13 ENCOUNTER — Telehealth: Payer: Self-pay

## 2020-12-13 NOTE — Telephone Encounter (Signed)
Spoke with spouse today regarding surgery. Patient is doing okay from surgery with little pain. Advised ok to continue all medications. aw

## 2020-12-16 ENCOUNTER — Ambulatory Visit (INDEPENDENT_AMBULATORY_CARE_PROVIDER_SITE_OTHER): Payer: Medicare Other | Admitting: Licensed Clinical Social Worker

## 2020-12-16 DIAGNOSIS — E785 Hyperlipidemia, unspecified: Secondary | ICD-10-CM

## 2020-12-16 DIAGNOSIS — E1169 Type 2 diabetes mellitus with other specified complication: Secondary | ICD-10-CM

## 2020-12-16 DIAGNOSIS — R809 Proteinuria, unspecified: Secondary | ICD-10-CM | POA: Diagnosis not present

## 2020-12-16 DIAGNOSIS — E1129 Type 2 diabetes mellitus with other diabetic kidney complication: Secondary | ICD-10-CM

## 2020-12-16 DIAGNOSIS — Z8673 Personal history of transient ischemic attack (TIA), and cerebral infarction without residual deficits: Secondary | ICD-10-CM

## 2020-12-16 DIAGNOSIS — F4321 Adjustment disorder with depressed mood: Secondary | ICD-10-CM

## 2020-12-16 DIAGNOSIS — Z5941 Food insecurity: Secondary | ICD-10-CM

## 2020-12-16 DIAGNOSIS — R296 Repeated falls: Secondary | ICD-10-CM

## 2020-12-19 ENCOUNTER — Ambulatory Visit (INDEPENDENT_AMBULATORY_CARE_PROVIDER_SITE_OTHER): Payer: Medicare Other | Admitting: Dermatology

## 2020-12-19 ENCOUNTER — Other Ambulatory Visit: Payer: Self-pay

## 2020-12-19 DIAGNOSIS — D2372 Other benign neoplasm of skin of left lower limb, including hip: Secondary | ICD-10-CM

## 2020-12-19 DIAGNOSIS — D239 Other benign neoplasm of skin, unspecified: Secondary | ICD-10-CM

## 2020-12-19 DIAGNOSIS — Z4802 Encounter for removal of sutures: Secondary | ICD-10-CM

## 2020-12-19 NOTE — Patient Instructions (Addendum)
Spiradenoma = benign neoplasms originally thought to arise from sweat glands in the dermal layer of the skin.  If you have any questions or concerns for your doctor, please call our main line at [phone] and press option 4 to reach your doctor's medical assistant. If no one answers, please leave a voicemail as directed and we will return your call as soon as possible. Messages left after 4 pm will be answered the following business day.   You may also send [name] a message via [place]. We typically respond to MyChart messages within 1-2 business days.  For prescription refills, please ask your pharmacy to contact our office. Our fax number is [phone].  If you have an urgent issue when the clinic is closed that cannot wait until the next business day, you can page your doctor at the number below.    Please note that while we do our best to be available for urgent issues outside of office hours, we are not available 24/7.   If you have an urgent issue and are unable to reach [name], you may choose to seek medical care at your doctor's office, retail clinic, urgent care center, or emergency room.  If you have a medical emergency, please immediately call 911 or go to the emergency department.  Pager Numbers  - Dr. Nehemiah Massed: [phone]  - Dr. Laurence Ferrari: [phone]  - Dr. Nicole Kindred: [phone]  In the event of inclement weather, please call our main line at [phone] for an update on the status of any delays or closures.  Dermatology Medication Tips: Please keep the boxes that topical medications come in in order to help keep track of the instructions about where and how to use these. Pharmacies typically print the medication instructions only on the boxes and not directly on the medication tubes.   If your medication is too expensive, please contact our office at [phone] option 4 or send [name] a message through [place].   We are unable to tell what your co-pay for medications will be  in advance as this is different depending on your insurance coverage. However, we may be able to find a substitute medication at lower cost or fill out paperwork to get insurance to cover a needed medication.   If a prior authorization is required to get your medication covered by your insurance company, please allow [name] 1-2 business days to complete this process.  Drug prices often vary depending on where the prescription is filled and some pharmacies may offer cheaper prices.  The website www.goodrx.com contains coupons for medications through different pharmacies. The prices here do not account for what the cost may be with help from insurance (it may be cheaper with your insurance), but the website can give you the price if you did not use any insurance.  - You can print the associated coupon and take it with your prescription to the pharmacy.  - You may also stop by our office during regular business hours and pick up a GoodRx coupon card.  - If you need your prescription sent electronically to a different pharmacy, notify our office through [facility] or by phone at [phone] option 4.

## 2020-12-19 NOTE — Patient Instructions (Signed)
Visit Information   Goals Addressed               This Visit's Progress     Patient Stated     SW-"I want to improve my health and self-care" (pt-stated)   On track     Timeframe:  Long-Range Goal Priority:  Medium  Start Date:  05/16/20                         Expected End Date:  03/18/21                    Follow Up Date- 02/24/21  Patient Self Care Activities:  Attend all scheduled provider appointments Call provider office for new concerns or questions Continue to utilize healthy coping skills to assist in management of symptoms        Patient verbalizes understanding of instructions provided today.   Telephone follow up appointment with care management team member scheduled for:02/24/21  Christa See, MSW, [email or web address] Phone [phone] 11:01 AM

## 2020-12-19 NOTE — Progress Notes (Signed)
Follow-Up Visit   Subjective  [name]. is a 60 y.o. male who presents for the following: Post op (Left lateral knee, Spiradenoma.).  The following portions of the chart were reviewed this encounter and updated as appropriate:       Review of Systems:  No other skin or systemic complaints except as noted in HPI or Assessment and Plan.  Objective  Well appearing patient in no apparent distress; mood and affect are within normal limits.  A focused examination was performed including left knee. Relevant physical exam findings are noted in the Assessment and Plan.  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   Wound cleansed, sutures removed, wound cleansed and steri strips applied. Discussed pathology results.   Return if symptoms worsen or fail to improve.  IJamesetta Orleans, CMA, am acting as scribe for [name], MD . Documentation: I have reviewed the above documentation for accuracy and completeness, and I agree with the above.  [name] MD

## 2020-12-19 NOTE — Chronic Care Management (AMB) (Signed)
Chronic Care Management    Clinical Social Work Note  12/19/2020 Name: [name]. MRN: [id number] DOB: February 24, 1961  Alex Metro. is a 60 y.o. year old male who is a primary care patient of Cannady, Barbaraann Faster, NP. The CCM team was consulted to assist the patient with chronic disease management and/or care coordination needs related to: [name]  and Grief Counseling.   Engaged with patient's spouse by telephone for follow up visit in response to provider referral for social work chronic care management and care coordination services.   Consent to Services:  The patient was given information about Chronic Care Management services, agreed to services, and gave verbal consent prior to initiation of services.  Please see initial visit note for detailed documentation.   Patient agreed to services and consent obtained.   Consent to Services:  The patient was given information about Care Management services, agreed to services, and gave verbal consent prior to initiation of services.  Please see initial visit note for detailed documentation.   Patient agreed to services today and consent obtained.   Assessment: Engaged with patient's spouse by phone in response to provider referral for social work care coordination services: [name]  and Grief Counseling.    Patient's spouse provided all information during this encounter. Patient continues to maintain positive progress with care plan goals. Family has applied for SNAP benefits to assist with food insecurity. Supportive resources were discussed, for future reference. See Care Plan below for interventions and patient self-care activities.  Recent life changes or stressors: Patient recently had a procedure completed on knee  Recommendation: Patient may benefit from, and is in agreement work with LCSW to address care coordination needs and will continue to work with the clinical team to address health care  and disease management related needs.   Follow up Plan: Patient would like continued follow-up from CCM LCSW .  per patient's request will follow up in 02/24/21.  Will call office if needed prior to next encounter.   SDOH (Social Determinants of Health) assessments and interventions performed:  Transportation  Advanced Directives Status: Not addressed in this encounter.  CCM Care Plan  Allergies  Allergen Reactions   Bee Venom Hives    All kinds of bees   Other     Certain powders   Sulfa Antibiotics     Outpatient Encounter Medications as of 12/16/2020  Medication Sig   aspirin 81 MG EC tablet Take 1 tablet (81 mg total) by mouth daily.   atorvastatin (LIPITOR) 80 MG tablet TAKE 1 TABLET BY MOUTH ONCE DAILY   Blood Glucose Monitoring Suppl (ONE TOUCH ULTRA 2) w/Device KIT Use to check blood sugar 4 times a day   BREO ELLIPTA 100-25 MCG/INH AEPB INHALE 1 PUFF BY MOUTH ONCE DAILY   Cholecalciferol 1.25 MG (50000 UT) TABS Take 1 tablet by mouth once a week. For 8 weeks and then stop.  Return to office for lab draw.   clobetasol cream (TEMOVATE) 2.42 % Apply 1 application topically 2 (two) times daily. Apply to affected area   clopidogrel (PLAVIX) 75 MG tablet Take 1 tablet by mouth daily.   diclofenac Sodium (VOLTAREN) 1 % GEL APPLY 2 GRAM TO AFFECTED AREA 3 TIMES DAILY   EPINEPHrine 0.3 mg/0.3 mL IJ SOAJ injection Inject 0.3 mg into the muscle as needed for anaphylaxis.   fenofibrate 54 MG tablet TAKE 1 TABLET BY MOUTH ONCE DAILY   gabapentin (NEURONTIN) 300 MG capsule TAKE 1  CAPSULE BY MOUTH TWICE DAILY   glucose blood (ONETOUCH ULTRA) test strip To check blood sugar three times daily   insulin degludec (TRESIBA FLEXTOUCH) 100 UNIT/ML FlexTouch Pen INJECT 52 UNITS SUBQ ONCE DAILY   Insulin Pen Needle (PEN NEEDLES) 32G X 4 MM MISC 1 kit by Does not apply route daily.   JARDIANCE 25 MG TABS tablet TAKE 1 TABLET BY MOUTH ONCE DAILY   Lancets (ONETOUCH ULTRASOFT) lancets Use as  instructed   lisinopril (ZESTRIL) 20 MG tablet TAKE 1 TABLET BY MOUTH ONCE DAILY   Misc. Devices (PULSE OXIMETER FOR FINGER) MISC To check O2 saturations once daily with asthma and document + check if any shortness of breath   montelukast (SINGULAIR) 10 MG tablet TAKE 1 TABLET BY MOUTH AT BEDTIME   nitroGLYCERIN (NITROSTAT) 0.4 MG SL tablet Place 1 tablet (0.4 mg total) under the tongue every 5 (five) minutes as needed for chest pain.   omeprazole (PRILOSEC) 20 MG capsule TAKE 1 CAPSULE BY MOUTH ONCE DAILY   polyethylene glycol (MIRALAX) 17 g packet Take 17 g by mouth daily.   tiZANidine (ZANAFLEX) 2 MG tablet Take 1 tablet (2 mg total) by mouth every 6 (six) hours as needed for muscle spasms.   trimethoprim-polymyxin b (POLYTRIM) ophthalmic solution Place 1 drop into the right eye every 6 (six) hours.    ULTRACARE PEN NEEDLES 32G X 5 MM MISC    Vitamin D, Ergocalciferol, (DRISDOL) 1.25 MG (50000 UNIT) CAPS capsule TAKE 1 CAPSULE BY MOUTH ONCE A WEEK   No facility-administered encounter medications on file as of 12/16/2020.    Patient Active Problem List   Diagnosis Date Noted   Heart murmur 09/21/2020   Carotid artery bruit 09/21/2020   Type 2 diabetes mellitus with proteinuria ([place]) 03/25/2019   Atherosclerosis of both carotid arteries 03/22/2019   History of CVA (cerebrovascular accident) 03/06/2019   Slow transit constipation    Uncontrolled type 2 diabetes mellitus with hyperglycemia ([place])    Obesity 09/03/2018   Frequent falls 05/15/2016   Vitamin D deficiency 08/22/2015   Carpal tunnel syndrome on left 05/12/2015   External hemorrhoids 12/20/2014   Allergic rhinitis 10/21/2014   Diverticulosis 10/21/2014   Hypertension associated with diabetes ([place]) 10/21/2014   Hyperlipidemia associated with type 2 diabetes mellitus ([place]) 10/21/2014   Asthma 10/21/2014   GERD (gastroesophageal reflux disease) 10/21/2014   NAFLD (nonalcoholic fatty liver disease) 10/21/2014   Arthritis  10/21/2014   History of pancreatitis 10/07/2014   Phenylketonuria (PKU) ([place]) 10/07/2014   Renal cyst, left 10/07/2014   Gall bladder polyp 10/01/2013    Conditions to be addressed/monitored: HTN and DMII; Transportation, Limited access to food, and grief, and financial strain  Care Plan : General Social Work (Adult)  Updates made by Rebekah Chesterfield, LCSW since 12/19/2020 12:00 AM     Problem: Coping Skills (General Plan of Care)      Long-Range Goal: Coping Skills Enhanced   Start Date: 05/16/2020  This Visit's Progress: On track  Recent Progress: On track  Priority: Medium  Note:   Timeframe:  Long-Range Goal Priority:  Medium  Start Date:  05/16/20                         Expected End Date:  03/18/21                  Follow Up Date- 02/24/21  Current Barriers:  Financial constraints Limited social support Level of  care concerns ADL IADL limitations Literacy concerns Cognitive Deficits Lacks knowledge of community resource: available financial support resources within the area as well as socialization opportunities within the area Grief support related to the recent loss of his mother Clinical Social Work Clinical Goal(s):  Over the next 120 days, client will work with SW to address concerns related to improving self-care and implementing it whenever able Interventions: Patient's spouse was interviewed in regards to patient and appropriate assessments performed Patient has been doing well managing his blood sugars and been staying in a good mood. Per spouse, patient is often dancing and singing around the house. The family have began to decorate for the upcoming holidays Patient experiences anxiety regarding upcoming procedure on knee scheduled next month. Concerned about limitations during the healing process (approx. 4-6 weeks) 09/30:Patient is healing from recent surgery. Spouse states he takes Tylenol extra strength and ibuprofen when he is feeling sore. Spouse cleanses  his wound. Patient has a follow up appt with surgeon on Monday, October 3, ,22 to remove stiches Patient is compliant with not lifting anything over five pounds or pushing heavy objects for six weeks Spouse reports strain paying the increase in rent, in addition, to medical bills. "It's been a struggle". Per patient, charity care denied assistance until bills are $5000 Patient's blood sugars have improved since he has been actively monitoring what he eats and drinks to promote health and well-being. His sleep has improved Patient has been mowing the yard and using exercise bike to promote activity Patient utilizes pantries to obtain food, when needed 08/31: Family receives assistance from local pantries and rescue missions every month. They are considering applying for Food Stamps and are interested in [employer] assistance-LCSW agreed to look into this 09/30: Patient continues to mindfulness with diet to assist with management of diabetes. Family has applied for SNAP benefits to assist with food insecurity. CCM LCSW informed spouse of [facility] [employer] to ensure he participates in upcoming medical appointments. Spouse shared she will let CCM LCSW know if a referral is needed in the future Family receives strong support from friends who assist with clothing Strategies to assist with promoting mood and stress management was discussed. Family agreed to practice gratitude thinking on a routine basis CCM LCSW reviewed upcoming appointments  Discussed plans with patient for ongoing care management follow up and provided patient with direct contact information for care management team Advised patient's spouse to contact CCM embedded practice providers and PCP if any urgent concerns arise. Brief self-care education provided to spouse during outreach today. Positive reinforcement provided for positive self-care implementation.  Patient Self Care Activities:  Attend all scheduled provider  appointments Call provider office for new concerns or questions Continue to utilize healthy coping skills to assist in management of symptoms        Christa See, MSW, [email or web address] Phone [phone] 11:06 AM

## 2020-12-21 ENCOUNTER — Other Ambulatory Visit: Payer: Self-pay | Admitting: Nurse Practitioner

## 2020-12-21 NOTE — Telephone Encounter (Signed)
Requested medications are due for refill today.  yes  Requested medications are on the active medications list.  yes  Last refill. 09/21/2020  Future visit scheduled.   yes  Notes to clinic.  Medication not delegated.

## 2020-12-23 ENCOUNTER — Ambulatory Visit: Payer: Medicare Other | Admitting: Nurse Practitioner

## 2020-12-28 ENCOUNTER — Ambulatory Visit: Payer: Medicare Other | Admitting: Nurse Practitioner

## 2020-12-28 DIAGNOSIS — E1165 Type 2 diabetes mellitus with hyperglycemia: Secondary | ICD-10-CM

## 2020-12-28 DIAGNOSIS — K219 Gastro-esophageal reflux disease without esophagitis: Secondary | ICD-10-CM

## 2020-12-28 DIAGNOSIS — I152 Hypertension secondary to endocrine disorders: Secondary | ICD-10-CM

## 2020-12-28 DIAGNOSIS — E1169 Type 2 diabetes mellitus with other specified complication: Secondary | ICD-10-CM

## 2020-12-28 DIAGNOSIS — N400 Enlarged prostate without lower urinary tract symptoms: Secondary | ICD-10-CM

## 2021-01-02 DIAGNOSIS — H00021 Hordeolum internum right upper eyelid: Secondary | ICD-10-CM | POA: Diagnosis not present

## 2021-01-02 DIAGNOSIS — H16049 Marginal corneal ulcer, unspecified eye: Secondary | ICD-10-CM | POA: Diagnosis not present

## 2021-01-03 ENCOUNTER — Telehealth: Payer: Medicare Other | Admitting: General Practice

## 2021-01-03 ENCOUNTER — Ambulatory Visit (INDEPENDENT_AMBULATORY_CARE_PROVIDER_SITE_OTHER): Payer: Medicare Other

## 2021-01-03 DIAGNOSIS — E1169 Type 2 diabetes mellitus with other specified complication: Secondary | ICD-10-CM

## 2021-01-03 DIAGNOSIS — H16001 Unspecified corneal ulcer, right eye: Secondary | ICD-10-CM

## 2021-01-03 DIAGNOSIS — E785 Hyperlipidemia, unspecified: Secondary | ICD-10-CM

## 2021-01-03 DIAGNOSIS — E1129 Type 2 diabetes mellitus with other diabetic kidney complication: Secondary | ICD-10-CM

## 2021-01-03 DIAGNOSIS — H00013 Hordeolum externum right eye, unspecified eyelid: Secondary | ICD-10-CM

## 2021-01-03 DIAGNOSIS — E1159 Type 2 diabetes mellitus with other circulatory complications: Secondary | ICD-10-CM

## 2021-01-03 NOTE — Patient Instructions (Signed)
Visit Information  PATIENT GOALS:  Goals Addressed             This Visit's Progress    RNCM: Lifestyle Change-Hypertension       Timeframe:  Long-Range Goal Priority:  High Start Date:       04-13-2020                      Expected End Date:     04-13-2021                  Follow Up Date 02-22-2021   - agree to work together to make changes - ask questions to understand - have a family meeting to talk about healthy habits - learn about high blood pressure   -limit sodium intake  Why is this important?   The changes that you are asked to make may be hard to do.  This is especially true when the changes are life-long.  Knowing why it is important to you is the first step.  Working on the change with your family or support person helps you not feel alone.  Reward yourself and family or support person when goals are met. This can be an activity you choose like bowling, hiking, biking, swimming or shooting hoops.     Notes: Per the patients wife the patient is using a lot of salt. 10-24-2020: The patients wife states that the patient is doing better with salt consumption. She has him switched from potato chips to vegetable chips without salt. She states he is really trying to do better. 01-03-2021: The patient is doing well. Denies any issues with his HTN. The patient states he has lost weight and he is eating healthier.      RNCM: Monitor and Manage My Blood Sugar-Diabetes Type 2       Timeframe:  Long-Range Goal Priority:  High Start Date:         04-13-2020                    Expected End Date:       04-17-2021                Follow Up Date 02-22-2021    - check blood sugar at prescribed times - check blood sugar before and after exercise - check blood sugar if I feel it is too high or too low - enter blood sugar readings and medication or insulin into daily log - take the blood sugar log to all doctor visits - take the blood sugar meter to all doctor visits    Why is this  important?   Checking your blood sugar at home helps to keep it from getting very high or very low.  Writing the results in a diary or log helps the doctor know how to care for you.  Your blood sugar log should have the time, date and the results.  Also, write down the amount of insulin or other medicine that you take.  Other information, like what you ate, exercise done and how you were feeling, will also be helpful.     Notes: Education on fasting blood sugars <130 and post prandial <180.  The patient has had some readings 300-500. 10-24-2020: Per his wife the patient has had blood sugars in the 100 to 300 range. The patient had a lower hemoglobin A1C in July but still not at target. Education and support given. 01-03-2021 states that his blood  sugars are in 100's and the biggest he has seen is a little over 200. States he has been watching what he eats better and he has lost weight. Praised for improvements in dietary habits. The patient will have a new A1C for follow up with the pcp in November.      RNCM: Obtain Eye Exam-Diabetes Type 2       Follow Up Date 02-22-2021   - keep appointment with eye doctor Has upcoming appointment with the eye doctor on 04-22-2020   Why is this important?   Eye check-ups are important when you have diabetes.  Vision loss can be prevented.    10-24-2020: Reminded the patient about eye exam. His wife states they are behind on several appointments due to her health issues and he is having surgery on his knee. Will continue to monitor. 01-03-2021: The patient is currently dealing with a sty that is clogged and an ulcer in his right eye. He needs a cataract removed. He is taking abx and goes back for follow up for sty and ulcer. States he can see some in his eye today and had not been able to see until today. Will continue to monitor.         The patient verbalized understanding of instructions, educational materials, and care plan provided today and declined offer to  receive copy of patient instructions, educational materials, and care plan.   Telephone follow up appointment with care management team member scheduled for: 02-22-2021 at 1 pm  Noreene Larsson RN, MSN, [facility] Mobile: [phone]

## 2021-01-03 NOTE — Chronic Care Management (AMB) (Signed)
Chronic Care Management   CCM RN Visit Note  01/03/2021 Name: [name]. MRN: [id number] DOB: 05/29/1960  Subjective: Alex Alfred Antoine Vandermeulen. is a 60 y.o. year old male who is a primary care patient of Cannady, Barbaraann Faster, NP. The care management team was consulted for assistance with disease management and care coordination needs.    Engaged with patient by telephone for follow up visit in response to provider referral for case management and/or care coordination services.   Consent to Services:  The patient was given information about Chronic Care Management services, agreed to services, and gave verbal consent prior to initiation of services.  Please see initial visit note for detailed documentation.   Patient agreed to services and verbal consent obtained.   Assessment: Review of patient past medical history, allergies, medications, health status, including review of consultants reports, laboratory and other test data, was performed as part of comprehensive evaluation and provision of chronic care management services.   SDOH (Social Determinants of Health) assessments and interventions performed:  SDOH Interventions    Flowsheet Row Most Recent Value  SDOH Interventions   Food Insecurity Interventions Intervention Not Indicated  Financial Strain Interventions Other (Comment)  [continued support from care guides and resources]  Housing Interventions Intervention Not Indicated  Intimate Partner Violence Interventions Intervention Not Indicated  Physical Activity Interventions Other (Comments)  [is active but no structured activity]  Stress Interventions Intervention Not Indicated  Social Connections Interventions Intervention Not Indicated, Other (Comment)  [good support system]  Transportation Interventions [facility]        CCM Care Plan  Allergies  Allergen Reactions   Bee Venom Hives    All kinds of bees   Other     Certain powders   Sulfa  Antibiotics     Outpatient Encounter Medications as of 01/03/2021  Medication Sig   aspirin 81 MG EC tablet Take 1 tablet (81 mg total) by mouth daily.   atorvastatin (LIPITOR) 80 MG tablet TAKE 1 TABLET BY MOUTH ONCE DAILY   Blood Glucose Monitoring Suppl (ONE TOUCH ULTRA 2) w/Device KIT Use to check blood sugar 4 times a day   BREO ELLIPTA 100-25 MCG/INH AEPB INHALE 1 PUFF BY MOUTH ONCE DAILY   Cholecalciferol 1.25 MG (50000 UT) TABS Take 1 tablet by mouth once a week. For 8 weeks and then stop.  Return to office for lab draw.   clobetasol cream (TEMOVATE) 4.58 % Apply 1 application topically 2 (two) times daily. Apply to affected area   clopidogrel (PLAVIX) 75 MG tablet Take 1 tablet by mouth daily.   diclofenac Sodium (VOLTAREN) 1 % GEL APPLY 2 GRAM TO AFFECTED AREA 3 TIMES DAILY   EPINEPHrine 0.3 mg/0.3 mL IJ SOAJ injection Inject 0.3 mg into the muscle as needed for anaphylaxis.   fenofibrate 54 MG tablet TAKE 1 TABLET BY MOUTH ONCE DAILY   gabapentin (NEURONTIN) 300 MG capsule TAKE 1 CAPSULE BY MOUTH TWICE DAILY   glucose blood (ONETOUCH ULTRA) test strip To check blood sugar three times daily   insulin degludec (TRESIBA FLEXTOUCH) 100 UNIT/ML FlexTouch Pen INJECT 52 UNITS SUBCUTANEOUSLY ONCE A DAY   Insulin Pen Needle (PEN NEEDLES) 32G X 4 MM MISC 1 kit by Does not apply route daily.   JARDIANCE 25 MG TABS tablet TAKE 1 TABLET BY MOUTH ONCE DAILY   Lancets (ONETOUCH ULTRASOFT) lancets Use as instructed   lisinopril (ZESTRIL) 20 MG tablet TAKE 1 TABLET BY MOUTH ONCE DAILY  Misc. Devices (PULSE OXIMETER FOR FINGER) MISC To check O2 saturations once daily with asthma and document + check if any shortness of breath   montelukast (SINGULAIR) 10 MG tablet TAKE 1 TABLET BY MOUTH AT BEDTIME   nitroGLYCERIN (NITROSTAT) 0.4 MG SL tablet Place 1 tablet (0.4 mg total) under the tongue every 5 (five) minutes as needed for chest pain.   omeprazole (PRILOSEC) 20 MG capsule TAKE 1 CAPSULE BY MOUTH  ONCE DAILY   polyethylene glycol (MIRALAX) 17 g packet Take 17 g by mouth daily.   tiZANidine (ZANAFLEX) 2 MG tablet TAKE 1 TABLET BY MOUTH EVERY 6 HOURS AS NEEDED FOR MUSCLE SPASMS   trimethoprim-polymyxin b (POLYTRIM) ophthalmic solution Place 1 drop into the right eye every 6 (six) hours.    ULTRACARE PEN NEEDLES 32G X 5 MM MISC    Vitamin D, Ergocalciferol, (DRISDOL) 1.25 MG (50000 UNIT) CAPS capsule TAKE 1 CAPSULE BY MOUTH ONCE A WEEK   No facility-administered encounter medications on file as of 01/03/2021.    Patient Active Problem List   Diagnosis Date Noted   Heart murmur 09/21/2020   Carotid artery bruit 09/21/2020   Type 2 diabetes mellitus with proteinuria ([place]) 03/25/2019   Atherosclerosis of both carotid arteries 03/22/2019   History of CVA (cerebrovascular accident) 03/06/2019   Slow transit constipation    Uncontrolled type 2 diabetes mellitus with hyperglycemia ([place])    Obesity 09/03/2018   Frequent falls 05/15/2016   Vitamin D deficiency 08/22/2015   Carpal tunnel syndrome on left 05/12/2015   External hemorrhoids 12/20/2014   Allergic rhinitis 10/21/2014   Diverticulosis 10/21/2014   Hypertension associated with diabetes ([place]) 10/21/2014   Hyperlipidemia associated with type 2 diabetes mellitus ([place]) 10/21/2014   Asthma 10/21/2014   GERD (gastroesophageal reflux disease) 10/21/2014   NAFLD (nonalcoholic fatty liver disease) 10/21/2014   Arthritis 10/21/2014   History of pancreatitis 10/07/2014   Phenylketonuria (PKU) ([place]) 10/07/2014   Renal cyst, left 10/07/2014   Gall bladder polyp 10/01/2013    Conditions to be addressed/monitored:HTN, HLD, DMII, and Right eye sty and ulcer   Care Plan : RNCM: Diabetes Type 2 (Adult)  Updates made by Vanita Ingles, RN since 01/03/2021 12:00 AM  Completed 01/03/2021   Problem: RNCM: Glycemic Management (Diabetes, Type 2) Resolved 01/03/2021  Priority: High     Long-Range Goal: RNCM: Glycemic Management Optimized  Completed 01/03/2021  Start Date: 04/13/2020  Expected End Date: 10/19/2021  Recent Progress: On track  Priority: High  Note:   Objective: resolving due to duplicate goal Lab Results  Component Value Date   HGBA1C 9.9 (H) 09/21/2020    Lab Results  Component Value Date   CREATININE 1.03 06/01/2020   CREATININE 1.26 05/23/2020   CREATININE 1.21 02/24/2020    No results found for: EGFR Current Barriers:  Knowledge Deficits related to basic Diabetes pathophysiology and self care/management Knowledge Deficits related to medications used for management of diabetes Financial Constraints Literacy barriers Cognitive Deficits Does not use cbg meter  Limited Social Support Unable to independently manage DM Does not adhere to provider recommendations re: Heart healthy/ADA Does not adhere to prescribed medication regimen Lacks social connections Does not contact provider office for questions/concerns Case Manager Clinical Goal(s):  Collaboration with [name], NP regarding development and update of comprehensive plan of care as evidenced by provider attestation and co-signature Inter-disciplinary care team collaboration (see longitudinal plan of care) patient will demonstrate improved adherence to prescribed treatment plan for diabetes self care/management as  evidenced by:  daily monitoring and recording of LFY:[id number] to ADA/ carb modified diet. exercise 4/5 days/week adherence to prescribed medication regimen- 10-24-2020: States compliance with medications regimen.  Interventions:  Provided education to patient about basic DM disease process Reviewed medications with patient and discussed importance of medication adherence Discussed plans with patient for ongoing care management follow up and provided patient with direct contact information for care management team Provided patient with written educational materials related to hypo and hyperglycemia and importance of correct  treatment. 10-24-2020: Review and the patient denies any lows at this time. Will continue to monitor and educate  Advised patient, providing education and rationale, to check cbg BID and record, calling pcp for findings outside established parameters.   10-24-2020: Review of the patients readings. The patients wife reports he is doing better but still having high readings at times. Range 100 to 300 per wife today Review of patient status, including review of consultants reports, relevant laboratory and other test results, and medications completed. Review of dietary restrictions: 10-24-2020: Review of heart healthy/ADA diet. The patient is drinking water and using flavor packs to flavor his water. He also is trying to eat healthier snacks.  Patient Goals/Self-Care Activities Over the next 120 days, patient will:  - UNABLE to independently manage DM Self administers oral medications as prescribed Attends all scheduled provider appointments Checks blood sugars as prescribed and utilize hyper and hypoglycemia protocol as needed Adheres to prescribed ADA/carb modified - barriers to adherence to treatment plan identified - blood glucose monitoring encouraged - blood glucose readings reviewed - individualized medical nutrition therapy provided - mutual A1C goal set or reviewed - resources required to improve adherence to care identified - self-awareness of signs/symptoms of hypo or hyperglycemia encouraged - use of blood glucose monitoring log promoted Follow Up Plan: Telephone follow up appointment with care management team member scheduled for: 01-03-2021 at 1:45pm    Care Plan : RNCM: Hypertension (Adult)  Updates made by Vanita Ingles, RN since 01/03/2021 12:00 AM  Completed 01/03/2021   Problem: RNCM: Hypertension (Hypertension) Resolved 01/03/2021  Priority: Medium     Long-Range Goal: RNCM: Hypertension Monitored Completed 01/03/2021  Start Date: 04/13/2020  Expected End Date: 10/19/2021   Recent Progress: On track  Priority: Medium  Note:   Objective: Resolving due to duplicate goal Last practice recorded BP readings:  BP Readings from Last 3 Encounters:  09/21/20 106/64  08/10/20 126/62  08/03/20 128/74    Most recent eGFR/CrCl: No results found for: EGFR  No components found for: CRCL Current Barriers:  Knowledge Deficits related to basic understanding of hypertension pathophysiology and self care management Knowledge Deficits related to understanding of medications prescribed for management of hypertension Non-adherence to prescribed medication regimen Literacy barriers Cognitive Deficits Limited Social Support Unable to independently manage HTN Unable to self administer medications as prescribed Does not adhere to provider recommendations re: refraining from salt use in diet Lacks social connections Does not contact provider office for questions/concerns Case Manager Clinical Goal(s):  patient will verbalize understanding of plan for hypertension management patient will attend all scheduled medical appointments: several upcoming appointments with specialist   patient will demonstrate improved adherence to prescribed treatment plan for hypertension as evidenced by taking all medications as prescribed, monitoring and recording blood pressure as directed, adhering to low sodium/DASH diet  patient will demonstrate improved health management independence as evidenced by checking blood pressure as directed and notifying PCP if SBP>160 or DBP > 90, taking all medications as  prescribe, and adhering to a low sodium diet as discussed. Interventions:  Collaboration with [name], NP regarding development and update of comprehensive plan of care as evidenced by provider attestation and co-signature Inter-disciplinary care team collaboration (see longitudinal plan of care) Evaluation of current treatment plan related to hypertension self management and patient's  adherence to plan as established by provider.10-24-2020: Per the patients wife the patient has been making healthier choices and not using as much salt. He also is not eating bags of potato chips. She has him eating vegetarian chips without salt. Praised for [employer]. Will continue to monitor.  Provided education to patient re: stroke prevention, s/s of heart attack and stroke, DASH diet, complications of uncontrolled blood pressure. 10-24-2020: Per wife they have not been able to follow up with the cardiologist for an ECHO. The patients wife has had issues with her own health. The patients wife states he also is going to have to have a procedure on his knee because he has an eight cm cyst on it. Will continue to monitor.  Reviewed medications with patient and discussed importance of compliance. 10-24-2020: States compliance to medications.  Discussed plans with patient for ongoing care management follow up and provided patient with direct contact information for care management team Advised patient, providing education and rationale, to monitor blood pressure daily and record, calling PCP for findings outside established parameters.  Patient Goals/Self-Care Activities  patient will:  - UNABLE to independently manage HTN Self administers medications as prescribed Attends all scheduled provider appointments Calls provider office for new concerns, questions, or BP outside discussed parameters Checks BP and records as discussed Follows a low sodium diet/DASH diet - blood pressure trends reviewed - depression screen reviewed - home or ambulatory blood pressure monitoring encouraged Follow Up Plan: Telephone follow up appointment with care management team member scheduled for: 01-03-2021 at 1:45 pm    Care Plan : [facility]; General Plan of Care (Adult) for Chronic Disease Management and Care Coordination Needs  Updates made by Vanita Ingles, RN since 01/03/2021 12:00 AM     Problem: RNCM:  Development of Care Plan for Chronic Disease Management and Care Coordination Needs (DM, HT, HLD, Eye sty/ulcer/cataract   Priority: High     Long-Range Goal: RNCM: Development of Care Plan for Chronic Disease Management and Care Coordination Needs (DM, HT, HLD, Eye sty/ulcer/cataract   Start Date: 01/03/2021  Expected End Date: 01/03/2022  Priority: High  Note:   Current Barriers:  Knowledge Deficits related to plan of care for management of HTN, HLD, DMII, and eye sty/ulcer   Care Coordination needs related to Financial constraints related to limited income, Limited social support, Transportation, and Level of care concerns  Chronic Disease Management support and education needs related to HTN, HLD, DMII, and eye sty/ulcer- right Lacks caregiver support.  Film/video editor.  Transportation barriers  RNCM Clinical Goal(s):  Patient will verbalize understanding of plan for management of HTN, HLD, DMII, and right eye ulcer/sty  verbalize basic understanding of HTN, HLD, DMII, and right eye ulcer/sty disease process and self health management plan   take all medications exactly as prescribed and will call provider for medication related questions demonstrate understanding of rationale for each prescribed medication 01-03-2021: The patient was just able to get his medications today for his eye as the medication was on back order. Follows up with eye specialist on 01-04-2021 attend all scheduled medical appointments: 01-18-2021 at 220 pm with pcp and eye specialist on 01-04-2021 demonstrate  improved and ongoing adherence to prescribed treatment plan for HTN, HLD, DMII, and eye sty/ulcer as evidenced by daily monitoring and recording of CBG  adherence to ADA/ carb modified diet adherence to prescribed medication regimen contacting provider for new or worsened symptoms or questions   demonstrate improved and ongoing health management independence for chronic conditions as evidence of effective  management and no acute exacerbations continue to work with Consulting civil engineer and/or Social Worker to address care management and care coordination needs related to HTN, HLD, DMII, and eye sty/ulcer  demonstrate a decrease in HTN, HLD, DMII, and eye styl/ulcer exacerbations   demonstrate ongoing self health care management ability    through collaboration with RN Care manager, provider, and care team.   Interventions: 1:1 collaboration with primary care provider regarding development and update of comprehensive plan of care as evidenced by provider attestation and co-signature Inter-disciplinary care team collaboration (see longitudinal plan of care) Evaluation of current treatment plan related to  self management and patient's adherence to plan as established by provider   SDOH Barriers (Status: Goal on track: YES.)  Patient interviewed and SDOH assessment performed        SDOH Interventions    Flowsheet Row Most Recent Value  SDOH Interventions   Food Insecurity Interventions Intervention Not Indicated  Financial Strain Interventions Other (Comment)  [continued support from care guides and resources]  Housing Interventions Intervention Not Indicated  Intimate Partner Violence Interventions Intervention Not Indicated  Physical Activity Interventions Other (Comments)  [is active but no structured activity]  Stress Interventions Intervention Not Indicated  Social Connections Interventions Intervention Not Indicated, Other (Comment)  [good support system]  Transportation Interventions [employer]     Patient interviewed and appropriate assessments performed Provided patient with information about resources available. The patient and his wife have worked with care guides before and know the process for getting help in the community for expressed needs Advised patient to call the office for changes in [place], questions or concerns, the patient states they are getting food  stamps now and are thankful for the food stamps Provided education to patient/caregiver regarding level of care options.    Diabetes:  (Status: Goal on track: YES.) Lab Results  Component Value Date   HGBA1C 9.9 (H) 09/21/2020  Assessed patient's understanding of A1c goal: <7% Provided education to patient about basic DM disease process; Reviewed medications with patient and discussed importance of medication adherence;        Reviewed prescribed diet with patient heart healthy/ADA diet; Counseled on importance of regular laboratory monitoring as prescribed;        Discussed plans with patient for ongoing care management follow up and provided patient with direct contact information for care management team;      Provided patient with written educational materials related to hypo and hyperglycemia and importance of correct treatment;       Reviewed scheduled/upcoming provider appointments including: 01-18-2021 at 220 pm;         Advised patient, providing education and rationale, to check cbg as directed and record. 01-03-2021: The patient checked his blood sugar while on the phone with the [facility] and it was 149. The patient states he has been in the 100's range and hardly ever over 200. Praised the patient for positive changes. Will have a new A1C coming up for follow up with the pcp on 01-18-2021. Denies any acute findings. States he is eating healthier and staying away from sodas currently.  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 Goal on track: NO.) Evaluation of current treatment plan related to  Right eye sty and ulcer  ,   self-management and patient's adherence to plan as established by provider. 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 States that they were on back order. He can see some today out of his right eye, education and support given; Reviewed scheduled/upcoming provider appointments including 01-04-2021 with the specialist ; Discussed plans with patient for ongoing care management follow up and provided patient with direct contact information for care management team;  Hyperlipidemia:  (Status: Goal on track: NO.) Lab Results  Component Value Date   CHOL 207 (H) 06/01/2020   HDL 27 (L) 06/01/2020   LDLCALC 72 06/01/2020   TRIG 694 ([place]) 06/01/2020   CHOLHDL 5.3 03/03/2019     Medication review performed; medication list updated in electronic medical record.  Provider established cholesterol goals reviewed; Counseled on importance of regular laboratory monitoring as prescribed; Provided HLD educational materials; Reviewed role and benefits of statin for ASCVD risk reduction; Discussed strategies to manage statin-induced myalgias; Reviewed importance of limiting foods high in cholesterol; Reviewed exercise goals and target of 150 minutes per week;  Hypertension: (Status: Goal on track: YES.) Last practice recorded BP readings:  BP Readings from Last 3 Encounters:  09/21/20 106/64  08/10/20 126/62  08/03/20 128/74  Most recent eGFR/CrCl:  Lab Results  Component Value Date   EGFR 85 06/01/2020     No components found for: CRCL  Evaluation of current treatment plan related to hypertension self management and patient's adherence to plan as established by provider;   Provided education to patient re: stroke prevention, s/s of heart attack and stroke; Reviewed prescribed diet heart healthy/ADA Reviewed medications with patient and discussed importance of compliance;  Counseled on adverse effects of illicit drug and excessive alcohol use in patients with high blood pressure;  Counseled on the importance of exercise goals with target of 150 minutes per week Discussed plans with patient for ongoing care management follow up and provided patient with direct contact information for care management team; Advised patient, providing education and rationale, to monitor blood pressure daily and record, calling PCP for findings outside established parameters;  Reviewed scheduled/upcoming provider appointments including: 01-18-2021 at 220 pm  Patient Goals/Self-Care Activities: Patient will self administer medications as prescribed as evidenced by self report/primary caregiver report  Patient will attend all scheduled provider appointments as evidenced by clinician review of documented attendance to scheduled appointments and patient/caregiver report Patient will call pharmacy for medication refills as evidenced by patient report and review of  pharmacy fill history as appropriate Patient will attend church or other social activities as evidenced by patient report Patient will continue to perform ADL's independently as evidenced by patient/caregiver report Patient will continue to perform IADL's independently as evidenced by patient/caregiver report Patient will call provider office for new concerns or questions as evidenced by review of documented incoming telephone call notes and patient report Patient will work with BSW to address care coordination needs and will continue to work with the clinical team  to address health care and disease management related needs as evidenced by documented adherence to scheduled care management/care coordination appointments - keep appointment with eye doctor - check blood sugar at prescribed times: before meals and at bedtime, when you have symptoms of low or high blood sugar, and before and after exercise - check feet daily for cuts, sores or redness - enter blood sugar readings and medication or insulin into daily log - take the blood sugar log to all doctor visits - trim toenails straight across - drink 6 to 8 glasses of water each day - eat fish at least once per week - fill half of plate with vegetables - limit fast food meals to no more than 1 per week - manage portion size - prepare main meal at home 3 to 5 days each week - read food labels for fat, fiber, carbohydrates and portion size - reduce red meat to 2 to 3 times a week - keep feet up while sitting - wash and dry feet carefully every day - wear comfortable, cotton socks - wear comfortable, well-fitting shoes - check blood pressure weekly - choose a place to take my blood pressure (home, clinic or office, retail store) - write blood pressure results in a log or diary - learn about high blood pressure - keep a blood pressure log - take blood pressure log to all doctor appointments - call doctor for signs and symptoms of high blood pressure - keep all doctor appointments - take medications for blood pressure exactly as prescribed - report new symptoms to your doctor - eat more whole grains, fruits and vegetables, lean meats and healthy fats - call for medicine refill 2 or 3 days before it runs out - take all medications exactly as prescribed - call doctor with any symptoms you believe are related to your medicine - call doctor when you experience any new symptoms - go to all doctor appointments as scheduled - adhere to prescribed diet: heart healthy/ADA diet        Plan:Telephone  follow up appointment with care management team member scheduled for:  02-22-2021 at 1 pm  Sigel, MSN, [facility] Mobile: [phone]

## 2021-01-04 DIAGNOSIS — H16041 Marginal corneal ulcer, right eye: Secondary | ICD-10-CM | POA: Diagnosis not present

## 2021-01-06 DIAGNOSIS — H16011 Central corneal ulcer, right eye: Secondary | ICD-10-CM | POA: Diagnosis not present

## 2021-01-11 DIAGNOSIS — H16011 Central corneal ulcer, right eye: Secondary | ICD-10-CM | POA: Diagnosis not present

## 2021-01-16 DIAGNOSIS — E1169 Type 2 diabetes mellitus with other specified complication: Secondary | ICD-10-CM | POA: Diagnosis not present

## 2021-01-16 DIAGNOSIS — H16011 Central corneal ulcer, right eye: Secondary | ICD-10-CM | POA: Diagnosis not present

## 2021-01-16 DIAGNOSIS — E1159 Type 2 diabetes mellitus with other circulatory complications: Secondary | ICD-10-CM

## 2021-01-16 DIAGNOSIS — I152 Hypertension secondary to endocrine disorders: Secondary | ICD-10-CM

## 2021-01-16 DIAGNOSIS — E1129 Type 2 diabetes mellitus with other diabetic kidney complication: Secondary | ICD-10-CM

## 2021-01-16 DIAGNOSIS — R809 Proteinuria, unspecified: Secondary | ICD-10-CM

## 2021-01-16 DIAGNOSIS — E785 Hyperlipidemia, unspecified: Secondary | ICD-10-CM

## 2021-01-16 DIAGNOSIS — H01001 Unspecified blepharitis right upper eyelid: Secondary | ICD-10-CM | POA: Diagnosis not present

## 2021-01-18 ENCOUNTER — Ambulatory Visit (INDEPENDENT_AMBULATORY_CARE_PROVIDER_SITE_OTHER): Payer: Medicare Other | Admitting: Nurse Practitioner

## 2021-01-18 ENCOUNTER — Other Ambulatory Visit: Payer: Self-pay

## 2021-01-18 ENCOUNTER — Encounter: Payer: Self-pay | Admitting: Nurse Practitioner

## 2021-01-18 VITALS — BP 112/65 | HR 60 | Temp 98.0°F | Ht 61.9 in | Wt 179.2 lb

## 2021-01-18 DIAGNOSIS — E6609 Other obesity due to excess calories: Secondary | ICD-10-CM

## 2021-01-18 DIAGNOSIS — I152 Hypertension secondary to endocrine disorders: Secondary | ICD-10-CM | POA: Diagnosis not present

## 2021-01-18 DIAGNOSIS — E1159 Type 2 diabetes mellitus with other circulatory complications: Secondary | ICD-10-CM | POA: Diagnosis not present

## 2021-01-18 DIAGNOSIS — Z6832 Body mass index (BMI) 32.0-32.9, adult: Secondary | ICD-10-CM

## 2021-01-18 DIAGNOSIS — G5602 Carpal tunnel syndrome, left upper limb: Secondary | ICD-10-CM

## 2021-01-18 DIAGNOSIS — K219 Gastro-esophageal reflux disease without esophagitis: Secondary | ICD-10-CM | POA: Diagnosis not present

## 2021-01-18 DIAGNOSIS — E701 Other hyperphenylalaninemias: Secondary | ICD-10-CM | POA: Diagnosis not present

## 2021-01-18 DIAGNOSIS — J4540 Moderate persistent asthma, uncomplicated: Secondary | ICD-10-CM

## 2021-01-18 DIAGNOSIS — R809 Proteinuria, unspecified: Secondary | ICD-10-CM

## 2021-01-18 DIAGNOSIS — E1129 Type 2 diabetes mellitus with other diabetic kidney complication: Secondary | ICD-10-CM | POA: Diagnosis not present

## 2021-01-18 DIAGNOSIS — R0989 Other specified symptoms and signs involving the circulatory and respiratory systems: Secondary | ICD-10-CM

## 2021-01-18 DIAGNOSIS — R011 Cardiac murmur, unspecified: Secondary | ICD-10-CM

## 2021-01-18 DIAGNOSIS — E1169 Type 2 diabetes mellitus with other specified complication: Secondary | ICD-10-CM | POA: Diagnosis not present

## 2021-01-18 DIAGNOSIS — Z8673 Personal history of transient ischemic attack (TIA), and cerebral infarction without residual deficits: Secondary | ICD-10-CM

## 2021-01-18 DIAGNOSIS — E1165 Type 2 diabetes mellitus with hyperglycemia: Secondary | ICD-10-CM

## 2021-01-18 DIAGNOSIS — S50812A Abrasion of left forearm, initial encounter: Secondary | ICD-10-CM | POA: Diagnosis not present

## 2021-01-18 DIAGNOSIS — Z23 Encounter for immunization: Secondary | ICD-10-CM

## 2021-01-18 DIAGNOSIS — N400 Enlarged prostate without lower urinary tract symptoms: Secondary | ICD-10-CM

## 2021-01-18 DIAGNOSIS — E785 Hyperlipidemia, unspecified: Secondary | ICD-10-CM

## 2021-01-18 MED ORDER — CHOLECALCIFEROL 1.25 MG (50000 UT) PO TABS: 1.0000 | ORAL_TABLET | ORAL | 3 refills | Status: DC

## 2021-01-18 NOTE — Assessment & Plan Note (Signed)
Continue collaboration with neurology and continue statin + ASA/Plavix.  Discussed goals BP <130/90, A1C < 6.5%, and LDL <70.  He is poorly controlled with diabetes, much related to knowledge base and poor diet.  Referral to endocrinology place and he was scheduled, but missed appointment -- cost of visits is an issue.

## 2021-01-18 NOTE — Assessment & Plan Note (Signed)
Ongoing issue, does not want to return to ortho right now due to cost.  Offered elbow injection as suspect some irritation at elbow level, tenderness there today -- he refused.  Recommend wrist support at home and Tylenol as needed.

## 2021-01-18 NOTE — Assessment & Plan Note (Signed)
Chronic, ongoing.  Continue Omeprazole daily and adjust as needed.  Mag level today.

## 2021-01-18 NOTE — Assessment & Plan Note (Addendum)
Chronic, ongoing.  A1C downward trend today at 8.7%, previous 9.9%.  Urine ALB 10 today.  Is improving diet at home. Missed endo visit in September for initial check, will have return if any increase in A1c -- cost is an issue.  Continue Jardiance and increase Tresiba to 70 units QHS, if fasting BS remain >130 in 3 days then increase by 2 units. - Poor tolerance to Metformin in past and allergic to Sulfa.  Would avoid GLP at this time due to patient history of pancreatitis, concern this would flare.  Educated patient at length on effect of diabetes from head to toe and increased risk for recurrent CVA due to poor control.   - Recommend they check his BS TID -- he would benefit from [facility] will work on this with CCM team in future.  Will most likely need meal time insulin, although concern as unsure would consistently administer -- his wife handles his medications.  Goal A1C <6.5%.  Continue collaboration with CCM team.  Return in 3 months.

## 2021-01-18 NOTE — Assessment & Plan Note (Signed)
Chronic, ongoing.  A1C downward trend today at 8.7%, previous 9.9%.  Is improving diet at home. Missed endo visit in September for initial check, will have return if any increase in A1c -- cost is an issue.  Continue Jardiance and increase Tresiba to 70 units QHS, if fasting BS remain >130 in 3 days then increase by 2 units. - Poor tolerance to Metformin in past and allergic to Sulfa.  Would avoid GLP at this time due to patient history of pancreatitis, concern this would flare.  Educated patient at length on effect of diabetes from head to toe and increased risk for recurrent CVA due to poor control.   - Recommend they check his BS TID -- he would benefit from [facility] will work on this with CCM team in future.  Will most likely need meal time insulin, although concern as unsure would consistently administer -- his wife handles his medications.  Goal A1C <6.5%.  Continue collaboration with CCM team.  Return in 3 months.

## 2021-01-18 NOTE — Assessment & Plan Note (Signed)
Grade 2/6 on auscultation, continue collaboration with cardiology.  Recommend he return for the testing that was ordered by cardiology, if does not return to see them will plan on ordering echo next visit.

## 2021-01-18 NOTE — Assessment & Plan Note (Signed)
BMI 32.88.  Recommended eating smaller high protein, low fat meals more frequently and exercising 30 mins a day 5 times a week with a goal of 10-15lb weight loss in the next 3 months. Patient voiced their understanding and motivation to adhere to these recommendations.

## 2021-01-18 NOTE — Assessment & Plan Note (Signed)
R>L, recommend he return to cardiology for further testing as was ordered by them.  Continue to work on stroke prevention goals, discussed with patient.  Continue to collaborate with CCM team.  If does not return to cardiology by next visit will order testing.

## 2021-01-18 NOTE — Assessment & Plan Note (Signed)
Chronic, ongoing.  Continue Lipitor and Fenofibrate. Lipid panel today.  Adjust doses as needed, if LDL >70.

## 2021-01-18 NOTE — Patient Instructions (Signed)
GO UP TO TRESIBA 70 UNITS PLEASE AT HOME === Goal fasting blood sugar is <130 in morning and <180 two hours after a meal  Diabetes Mellitus and Nutrition, Adult When you have diabetes, or diabetes mellitus, it is very important to have healthy eating habits because your blood sugar (glucose) levels are greatly affected by what you eat and drink. Eating healthy foods in the right amounts, at about the same times every day, can help you: Control your blood glucose. Lower your risk of heart disease. Improve your blood pressure. Reach or maintain a healthy weight. What can affect my meal plan? Every person with diabetes is different, and each person has different needs for a meal plan. Your health care provider may recommend that you work with a dietitian to make a meal plan that is best for you. Your meal plan may vary depending on factors such as: The calories you need. The medicines you take. Your weight. Your blood glucose, blood pressure, and cholesterol levels. Your activity level. Other health conditions you have, such as heart or kidney disease. How do carbohydrates affect me? Carbohydrates, also called carbs, affect your blood glucose level more than any other type of food. Eating carbs naturally raises the amount of glucose in your blood. Carb counting is a method for keeping track of how many carbs you eat. Counting carbs is important to keep your blood glucose at a healthy level, especially if you use insulin or take certain oral diabetes medicines. It is important to know how many carbs you can safely have in each meal. This is different for every person. Your dietitian can help you calculate how many carbs you should have at each meal and for each snack. How does alcohol affect me? Alcohol can cause a sudden decrease in blood glucose (hypoglycemia), especially if you use insulin or take certain oral diabetes medicines. Hypoglycemia can be a life-threatening condition. Symptoms of  hypoglycemia, such as sleepiness, dizziness, and confusion, are similar to symptoms of having too much alcohol. Do not drink alcohol if: Your health care provider tells you not to drink. You are pregnant, may be pregnant, or are planning to become pregnant. If you drink alcohol: Do not drink on an empty stomach. Limit how much you use to: 0-1 drink a day for women. 0-2 drinks a day for men. Be aware of how much alcohol is in your drink. In the [place] one drink equals one 12 oz bottle of beer (355 mL), one 5 oz glass of wine (148 mL), or one 1 oz glass of hard liquor (44 mL). Keep yourself hydrated with water, diet soda, or unsweetened iced tea. Keep in mind that regular soda, juice, and other mixers may contain a lot of sugar and must be counted as carbs. What are tips for following this plan? Reading food labels Start by checking the serving size on the "Nutrition Facts" label of packaged foods and drinks. The amount of calories, carbs, fats, and other nutrients listed on the label is based on one serving of the item. Many items contain more than one serving per package. Check the total grams (g) of carbs in one serving. You can calculate the number of servings of carbs in one serving by dividing the total carbs by 15. For example, if a food has 30 g of total carbs per serving, it would be equal to 2 servings of carbs. Check the number of grams (g) of saturated fats and trans fats in one serving. Choose foods  that have a low amount or none of these fats. Check the number of milligrams (mg) of salt (sodium) in one serving. Most people should limit total sodium intake to less than 2,300 mg per day. Always check the nutrition information of foods labeled as "low-fat" or "nonfat." These foods may be higher in added sugar or refined carbs and should be avoided. Talk to your dietitian to identify your daily goals for nutrients listed on the label. Shopping Avoid buying canned, pre-made, or processed  foods. These foods tend to be high in fat, sodium, and added sugar. Shop around the outside edge of the grocery store. This is where you will most often find fresh fruits and vegetables, bulk grains, fresh meats, and fresh dairy. Cooking Use low-heat cooking methods, such as baking, instead of high-heat cooking methods like deep frying. Cook using healthy oils, such as olive, canola, or sunflower oil. Avoid cooking with butter, cream, or high-fat meats. Meal planning Eat meals and snacks regularly, preferably at the same times every day. Avoid going long periods of time without eating. Eat foods that are high in fiber, such as fresh fruits, vegetables, beans, and whole grains. Talk with your dietitian about how many servings of carbs you can eat at each meal. Eat 4-6 oz (112-168 g) of lean protein each day, such as lean meat, chicken, fish, eggs, or tofu. One ounce (oz) of lean protein is equal to: 1 oz (28 g) of meat, chicken, or fish. 1 egg.  cup (62 g) of tofu. Eat some foods each day that contain healthy fats, such as avocado, nuts, seeds, and fish. What foods should I eat? Fruits Berries. Apples. Oranges. Peaches. Apricots. Plums. Grapes. Mango. Papaya. Pomegranate. Kiwi. Cherries. Vegetables Lettuce. Spinach. Leafy greens, including kale, chard, collard greens, and mustard greens. Beets. Cauliflower. Cabbage. Broccoli. Carrots. Green beans. Tomatoes. Peppers. Onions. Cucumbers. Brussels sprouts. Grains Whole grains, such as whole-wheat or whole-grain bread, crackers, tortillas, cereal, and pasta. Unsweetened oatmeal. Quinoa. Brown or wild rice. Meats and other proteins Seafood. Poultry without skin. Lean cuts of poultry and beef. Tofu. Nuts. Seeds. Dairy Low-fat or fat-free dairy products such as milk, yogurt, and cheese. The items listed above may not be a complete list of foods and beverages you can eat. Contact a dietitian for more information. What foods should I  avoid? Fruits Fruits canned with syrup. Vegetables Canned vegetables. Frozen vegetables with butter or cream sauce. Grains Refined white flour and flour products such as bread, pasta, snack foods, and cereals. Avoid all processed foods. Meats and other proteins Fatty cuts of meat. Poultry with skin. Breaded or fried meats. Processed meat. Avoid saturated fats. Dairy Full-fat yogurt, cheese, or milk. Beverages Sweetened drinks, such as soda or iced tea. The items listed above may not be a complete list of foods and beverages you should avoid. Contact a dietitian for more information. Questions to ask a health care provider Do I need to meet with a diabetes educator? Do I need to meet with a dietitian? What number can I call if I have questions? When are the best times to check my blood glucose? Where to find more information: American Diabetes Association: diabetes.org Academy of Nutrition and Dietetics: www.eatright.[employer] of Diabetes and Digestive and Kidney Diseases: [employer] Association of Diabetes Care and Education Specialists: www.diabeteseducator.org Summary It is important to have healthy eating habits because your blood sugar (glucose) levels are greatly affected by what you eat and drink. A healthy meal plan will help you control  your blood glucose and maintain a healthy lifestyle. Your health care provider may recommend that you work with a dietitian to make a meal plan that is best for you. Keep in mind that carbohydrates (carbs) and alcohol have immediate effects on your blood glucose levels. It is important to count carbs and to use alcohol carefully. This information is not intended to replace advice given to you by your health care provider. Make sure you discuss any questions you have with your health care provider. Document Revised: 02/10/2019 Document Reviewed: 02/10/2019 Elsevier Patient Education  2021 [employer].

## 2021-01-18 NOTE — Assessment & Plan Note (Signed)
Mental delays present, continue to work with CCM team in [place] with education and reiterating diet needs.

## 2021-01-18 NOTE — Progress Notes (Signed)
BP 112/65   Pulse 60   Temp 98 F (36.7 C) (Oral)   Ht 5' 1.9" (1.572 m)   Wt 179 lb 3.2 oz (81.3 kg)   SpO2 96%   BMI 32.88 kg/m    Subjective:    Patient ID: Alex Metro., male    DOB: 1960-10-16, 60 y.o.   MRN: [id number]  HPI: Alex Machuca. is a 60 y.o. male  Chief Complaint  Patient presents with   Diabetes   Hyperlipidemia   Hypertension   Wife is not with him at bedside today, at baseline patient poor historian and wife often monitors medications and health.  DIABETES Recent A1C in 9.9% July.  He continues on Jardiance 25 MG along with [place].  He was scheduled to see endocrinology for initial visit on 11/24/20, but missed this visit.    His wife, [name], is in charge of medications at home.  In past Metformin caused kidney issues.  Has history of GI issues and pancreatitis -- has struggled with GLP1 in past with GI issues.    Saw Dr. Juleen China with nephrology on 01/25/20 for proteinuria -- did not return in May as scheduled, reports he could not afford the $30 at the time.  He reports he is returning to see him on Monday.   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 Has seen dietician.  He reports he is trying to drink zero sugar soda only.  Hypoglycemic episodes:no Polydipsia/polyuria: yes Visual disturbance: no Chest pain: no Paresthesias: no Glucose Monitoring: yes             Accucheck frequency: TID -- did not bring book with him today             Fasting glucose: 130 to 232             Post prandial:              Evening: 149 to 299             Before meals: Taking Insulin?: yes             Long acting insulin: Tresiba 62 units             Short acting insulin: Blood Pressure Monitoring: not checking Retinal Examination: Up To Date == has cataracts and eye ulcer right eye Foot Exam: Up to Date Pneumovax: Up to Date Influenza: Up to Date Aspirin:  yes   ASTHMA Started Breo on 09/02/19 -- he reports using this every day - and Albuterol as needed which he has not had to use in long while.  Never a smoker.  FEV1 70% in June 2021.  Takes Singulair daily.  Continues to have well-controlled GERD with Prilosec. 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 Sees Dr. Humphrey Rolls for cardiology, has not seen in awhile per his report -- never obtained echo due to cost. Had initial visit with Dr. Melrose Nakayama on 11/09/19 -- he is to continue Plavix and ASA.    Current medications Lisinopril, Atorvastatin, Fenofibrate,  Satisfied with current treatment? yes Duration  of hypertension: chronic BP monitoring frequency: daily BP range:  BP medication side effects: no Duration of hyperlipidemia: chronic Cholesterol medication side effects: no Cholesterol supplements: none Medication compliance: good compliance Aspirin: yes Recent stressors: no Recurrent headaches: no Visual changes: no Palpitations: no Dyspnea: occasional Chest pain: no Lower extremity edema: no Dizzy/lightheaded: no  ARM PAIN Reports ongoing left arm pain from elbow to wrist into fingers, sometimes fingers get numb.  Had trigger finger surgery to this hand years ago. Duration: months Location: left Mechanism of injury: unknown Onset: gradual Severity: mild  Quality:  dull and aching Frequency: intermittent Radiation: no Aggravating factors: movement  Alleviating factors: rest  Status: fluctuating Treatments attempted: rest  Relief with NSAIDs?:  No NSAIDs Taken Swelling: no Redness: no  Warmth: no Trauma:  no Chest pain: no  Shortness of breath: no  Fever: no Decreased sensation: no Paresthesias: no Weakness: no    Relevant past medical, surgical, family and social history reviewed and updated as indicated. Interim medical history since our last visit reviewed. Allergies and medications reviewed and updated.  Review of Systems  Constitutional:  Negative for activity change, diaphoresis, fatigue and fever.  Respiratory:  Negative for cough, chest tightness, shortness of breath and wheezing.   Cardiovascular:  Negative for chest pain, palpitations and leg swelling.  Gastrointestinal: Negative.   Endocrine: Negative for cold intolerance, heat intolerance, polydipsia, polyphagia and polyuria.  Musculoskeletal:  Positive for arthralgias.  Neurological: Negative.   Psychiatric/Behavioral: Negative.     Per HPI unless specifically indicated above     Objective:    BP 112/65   Pulse 60   Temp 98 F (36.7 C) (Oral)   Ht 5' 1.9" (1.572 m)   Wt 179 lb 3.2 oz (81.3 kg)   SpO2 96%   BMI 32.88 kg/m   Wt Readings from Last 3 Encounters:  01/18/21 179 lb 3.2 oz (81.3 kg)  09/21/20 174 lb 3.2 oz (79 kg)  08/10/20 177 lb (80.3 kg)    Physical Exam Vitals and nursing note reviewed.  Constitutional:      General: He is awake. He is not in acute distress.    Appearance: He is well-developed. He is obese. He is not ill-appearing.  HENT:     Head: Normocephalic and atraumatic.     Right Ear: Hearing normal. No drainage.     Left Ear: Hearing normal. No drainage.  Eyes:     General: Lids are normal.        Right eye: No discharge.        Left eye: No discharge.     Conjunctiva/sclera: Conjunctivae normal.     Pupils: Pupils are equal, round, and reactive to light.  Neck:     Thyroid: No thyromegaly.     Vascular: Carotid bruit (R>L) present.  Cardiovascular:     Rate and Rhythm: Normal rate and regular rhythm.     Heart sounds: S1 normal and S2 normal. Murmur heard.  Systolic  murmur is present with a grade of 2/6.    No gallop.     Comments: Systolic murmur noted best at left sternal border, soft. Pulmonary:     Effort: Pulmonary effort is normal. No accessory muscle usage or respiratory distress.     Breath sounds: Normal breath sounds.  Abdominal:     General: Bowel sounds are normal.     Palpations: Abdomen is soft.  Musculoskeletal:        General: Normal range of motion.  Right elbow: Normal.     Left elbow: No swelling, deformity or lacerations. Normal range of motion. Tenderness present in lateral epicondyle.     Right forearm: Normal.     Left forearm: Normal.     Right wrist: Normal.     Left wrist: Normal.     Cervical back: Normal range of motion and neck supple.     Right lower leg: No edema.     Left lower leg: No edema.  Skin:    General: Skin is warm and dry.     Capillary Refill: Capillary refill takes less than 2 seconds.     Findings: Abrasion (small, healed to forearm) present.  Neurological:     Mental Status: He is alert and oriented to person, place, and time.     Deep Tendon Reflexes: Reflexes are normal and symmetric.  Psychiatric:        Attention and Perception: Attention normal.        Mood and Affect: Mood normal.        Speech: Speech normal.        Behavior: Behavior normal. Behavior is cooperative.   Results for orders placed or performed in visit on 09/21/20  Bayer DCA Hb A1c Waived  Result Value Ref Range   HB A1C (BAYER DCA - WAIVED) 9.9 (H) <7.0 %      Assessment & Plan:   Problem List Items Addressed This Visit       Cardiovascular and Mediastinum   Hypertension associated with diabetes ([place])    Chronic, stable with BP at goal in office.  Continue current medication regimen and collaboration with cardiology, adjust regimen as needed.  Labs: CMP and CBC.  Recommend they check his BP at home at least daily and document + focus on DASH diet.  Return to office in 3 months for BP follow-up.      Relevant  Orders   Bayer DCA Hb A1c Waived   Microalbumin, Urine Waived   CBC with Differential/Platelet   Comprehensive metabolic panel     Respiratory   Asthma    Chronic, ongoing.  FEV1 70% at check in 2021.  Tolerating Breo, to continue to use daily.  Recommend using Albuterol as needed only if episodes of SOB present. Return in 3 months. Repeat spirometry next visit.        Digestive   GERD (gastroesophageal reflux disease)    Chronic, ongoing.  Continue Omeprazole daily and adjust as needed.  Mag level today.      Relevant Orders   Magnesium     Endocrine   Hyperlipidemia associated with type 2 diabetes mellitus (HCC)    Chronic, ongoing.  Continue Lipitor and Fenofibrate. Lipid panel today.  Adjust doses as needed, if LDL >70.      Relevant Orders   Bayer DCA Hb A1c Waived   Comprehensive metabolic panel   Lipid Panel w/o Chol/HDL Ratio   Type 2 diabetes mellitus with proteinuria (HCC)    Chronic, ongoing.  A1C downward trend today at 8.7%, previous 9.9%.  Urine ALB 10 today.  Is improving diet at home. Missed endo visit in September for initial check, will have return if any increase in A1c -- cost is an issue.  Continue Jardiance and increase Tresiba to 70 units QHS, if fasting BS remain >130 in 3 days then increase by 2 units. - Poor tolerance to Metformin in past and allergic to Sulfa.  Would avoid GLP at this time due to patient  history of pancreatitis, concern this would flare.  Educated patient at length on effect of diabetes from head to toe and increased risk for recurrent CVA due to poor control.   - Recommend they check his BS TID -- he would benefit from [facility] will work on this with CCM team in future.  Will most likely need meal time insulin, although concern as unsure would consistently administer -- his wife handles his medications.  Goal A1C <6.5%.  Continue collaboration with CCM team.  Return in 3 months.      Relevant Orders   Bayer DCA Hb A1c Waived    Microalbumin, Urine Waived   Uncontrolled type 2 diabetes mellitus with hyperglycemia (HCC) - Primary    Chronic, ongoing.  A1C downward trend today at 8.7%, previous 9.9%.  Is improving diet at home. Missed endo visit in September for initial check, will have return if any increase in A1c -- cost is an issue.  Continue Jardiance and increase Tresiba to 70 units QHS, if fasting BS remain >130 in 3 days then increase by 2 units. - Poor tolerance to Metformin in past and allergic to Sulfa.  Would avoid GLP at this time due to patient history of pancreatitis, concern this would flare.  Educated patient at length on effect of diabetes from head to toe and increased risk for recurrent CVA due to poor control.   - Recommend they check his BS TID -- he would benefit from [facility] will work on this with CCM team in future.  Will most likely need meal time insulin, although concern as unsure would consistently administer -- his wife handles his medications.  Goal A1C <6.5%.  Continue collaboration with CCM team.  Return in 3 months.      Relevant Orders   Bayer DCA Hb A1c Waived     Nervous and Auditory   Carpal tunnel syndrome on left    Ongoing issue, does not want to return to ortho right now due to cost.  Offered elbow injection as suspect some irritation at elbow level, tenderness there today -- he refused.  Recommend wrist support at home and Tylenol as needed.        Other   Carotid artery bruit    R>L, recommend he return to cardiology for further testing as was ordered by them.  Continue to work on stroke prevention goals, discussed with patient.  Continue to collaborate with CCM team.  If does not return to cardiology by next visit will order testing.      Heart murmur    Grade 2/6 on auscultation, continue collaboration with cardiology.  Recommend he return for the testing that was ordered by cardiology, if does not return to see them will plan on ordering echo next visit.       History of CVA (cerebrovascular accident)    Continue collaboration with neurology and continue statin + ASA/Plavix.  Discussed goals BP <130/90, A1C < 6.5%, and LDL <70.  He is poorly controlled with diabetes, much related to knowledge base and poor diet.  Referral to endocrinology place and he was scheduled, but missed appointment -- cost of visits is an issue.      Obesity    BMI 32.88.  Recommended eating smaller high protein, low fat meals more frequently and exercising 30 mins a day 5 times a week with a goal of 10-15lb weight loss in the next 3 months. Patient voiced their understanding and motivation to adhere to these recommendations.  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      Other Visit Diagnoses     Benign prostatic hyperplasia without lower urinary tract symptoms       PSA on labs today   Relevant Orders   PSA   Need for influenza vaccination       Flu vaccine today   Relevant Orders   Flu Vaccine QUAD 4moIM (Fluarix, Fluzone & Alfiuria Quad PF) (Completed)   Abrasion of left forearm, initial encounter       Tetanus vaccine provided today.   Relevant Orders   Td : Tetanus/diphtheria >7yo Preservative  free (Completed)   Need for Td vaccine       Tetanus vaccine today       Time: 25 minutes, >50% spent counseling/or care coordination/education   Follow up plan: Return in about 3 months (around 04/20/2021) for T2DM, HTN/HLD, ASTHMA, Vit D -- needs spirometry.

## 2021-01-18 NOTE — Assessment & Plan Note (Signed)
Chronic, stable with BP at goal in office.  Continue current medication regimen and collaboration with cardiology, adjust regimen as needed.  Labs: CMP and CBC.  Recommend they check his BP at home at least daily and document + focus on DASH diet.  Return to office in 3 months for BP follow-up.

## 2021-01-18 NOTE — Assessment & Plan Note (Signed)
Chronic, ongoing.  FEV1 70% at check in 2021.  Tolerating Breo, to continue to use daily.  Recommend using Albuterol as needed only if episodes of SOB present. Return in 3 months. Repeat spirometry next visit.

## 2021-01-19 LAB — CBC WITH DIFFERENTIAL/PLATELET
Basophils Absolute: 0 10*3/uL (ref 0.0–0.2)
Basos: 1 %
EOS (ABSOLUTE): 0.1 10*3/uL (ref 0.0–0.4)
Eos: 2 %
Hematocrit: 42.6 % (ref 37.5–51.0)
Hemoglobin: 13.4 g/dL (ref 13.0–17.7)
Immature Grans (Abs): 0 10*3/uL (ref 0.0–0.1)
Immature Granulocytes: 0 %
Lymphocytes Absolute: 1.2 10*3/uL (ref 0.7–3.1)
Lymphs: 24 %
MCH: 28.7 pg (ref 26.6–33.0)
MCHC: 31.5 g/dL (ref 31.5–35.7)
MCV: 91 fL (ref 79–97)
Monocytes Absolute: 0.4 10*3/uL (ref 0.1–0.9)
Monocytes: 7 %
Neutrophils Absolute: 3.3 10*3/uL (ref 1.4–7.0)
Neutrophils: 66 %
Platelets: 176 10*3/uL (ref 150–450)
RBC: 4.67 x10E6/uL (ref 4.14–5.80)
RDW: 13.1 % (ref 11.6–15.4)
WBC: 5 10*3/uL (ref 3.4–10.8)

## 2021-01-19 LAB — COMPREHENSIVE METABOLIC PANEL
ALT: 27 IU/L (ref 0–44)
AST: 24 IU/L (ref 0–40)
Albumin/Globulin Ratio: 2 (ref 1.2–2.2)
Albumin: 4.4 g/dL (ref 3.8–4.9)
Alkaline Phosphatase: 144 IU/L — ABNORMAL HIGH (ref 44–121)
BUN/Creatinine Ratio: 17 (ref 9–20)
BUN: 21 mg/dL (ref 6–24)
Bilirubin Total: 0.3 mg/dL (ref 0.0–1.2)
CO2: 19 mmol/L — ABNORMAL LOW (ref 20–29)
Calcium: 8.7 mg/dL (ref 8.7–10.2)
Chloride: 103 mmol/L (ref 96–106)
Creatinine, Ser: 1.25 mg/dL (ref 0.76–1.27)
Globulin, Total: 2.2 g/dL (ref 1.5–4.5)
Glucose: 314 mg/dL — ABNORMAL HIGH (ref 70–99)
Potassium: 4.6 mmol/L (ref 3.5–5.2)
Sodium: 136 mmol/L (ref 134–144)
Total Protein: 6.6 g/dL (ref 6.0–8.5)
eGFR: 67 mL/min/{1.73_m2} (ref >59)

## 2021-01-19 LAB — BAYER DCA HB A1C WAIVED: HB A1C (BAYER DCA - WAIVED): 8.7 % — ABNORMAL HIGH (ref 4.8–5.6)

## 2021-01-19 LAB — LIPID PANEL W/O CHOL/HDL RATIO
Cholesterol, Total: 147 mg/dL (ref 100–199)
HDL: 36 mg/dL — ABNORMAL LOW (ref >39)
LDL Chol Calc (NIH): 72 mg/dL (ref 0–99)
Triglycerides: 237 mg/dL — ABNORMAL HIGH (ref 0–149)
VLDL Cholesterol Cal: 39 mg/dL (ref 5–40)

## 2021-01-19 LAB — MICROALBUMIN, URINE WAIVED
Creatinine, Urine Waived: 50 mg/dL (ref 10–300)
Microalb, Ur Waived: 10 mg/L (ref 0–19)
Microalb/Creat Ratio: <30 mg/g (ref <30)

## 2021-01-19 LAB — MAGNESIUM: Magnesium: 2.3 mg/dL (ref 1.6–2.3)

## 2021-01-19 LAB — PSA: Prostate Specific Ag, Serum: 0.2 ng/mL (ref 0.0–4.0)

## 2021-01-19 NOTE — Progress Notes (Signed)
Good morning awesome crew, please let [name] and his wife know labs have returned: - CBC is normal with no infection or anemia - Kidney function is normal and liver function is normal.  Sugar was elevated, please ensure you perform insulin dose changes I recommended.  Continue focusing on diet changes. - Cholesterol levels stable, continue medications. - Prostate lab is normal.  Magnesium level is normal.  Any questions? Keep being amazing!!  Thank you for allowing me to participate in your care.  I appreciate you. Kindest regards, Aurelie Dicenzo

## 2021-01-23 DIAGNOSIS — E1129 Type 2 diabetes mellitus with other diabetic kidney complication: Secondary | ICD-10-CM | POA: Diagnosis not present

## 2021-01-23 DIAGNOSIS — I10 Essential (primary) hypertension: Secondary | ICD-10-CM | POA: Diagnosis not present

## 2021-01-23 DIAGNOSIS — R809 Proteinuria, unspecified: Secondary | ICD-10-CM | POA: Diagnosis not present

## 2021-01-24 DIAGNOSIS — H01001 Unspecified blepharitis right upper eyelid: Secondary | ICD-10-CM | POA: Diagnosis not present

## 2021-01-24 DIAGNOSIS — H16011 Central corneal ulcer, right eye: Secondary | ICD-10-CM | POA: Diagnosis not present

## 2021-01-28 ENCOUNTER — Other Ambulatory Visit: Payer: Self-pay | Admitting: Nurse Practitioner

## 2021-01-29 NOTE — Telephone Encounter (Signed)
Requested Prescriptions  Pending Prescriptions Disp Refills  . JARDIANCE 25 MG TABS tablet [Pharmacy Med Name: JARDIANCE 25 MG TAB] 90 tablet 0    Sig: TAKE 1 TABLET BY MOUTH ONCE DAILY     Endocrinology:  Diabetes - SGLT2 Inhibitors Failed - 01/28/2021 12:33 PM      Failed - HBA1C is between 0 and 7.9 and within 180 days    HB A1C (BAYER DCA - WAIVED)  Date Value Ref Range Status  01/18/2021 8.7 (H) 4.8 - 5.6 % Final    Comment:             Prediabetes: 5.7 - 6.4          Diabetes: >6.4          Glycemic control for adults with diabetes: <7.0          Passed - Cr in normal range and within 360 days    Creatinine, Ser  Date Value Ref Range Status  01/18/2021 1.25 0.76 - 1.27 mg/dL Final   Creatinine, POC  Date Value Ref Range Status  08/22/2015 n/a mg/dL Final         Passed - LDL in normal range and within 360 days    LDL Chol Calc (NIH)  Date Value Ref Range Status  01/18/2021 72 0 - 99 mg/dL Final         Passed - eGFR in normal range and within 360 days    GFR calc Af Amer  Date Value Ref Range Status  02/24/2020 76 >59 mL/min/1.73 Final    Comment:    **In accordance with recommendations from the NKF-ASN Task force,**   [facility] is in the process of updating its eGFR calculation to the   2021 CKD-EPI creatinine equation that estimates kidney function   without a race variable.    GFR calc non Af Amer  Date Value Ref Range Status  02/24/2020 66 >59 mL/min/1.73 Final   eGFR  Date Value Ref Range Status  01/18/2021 67 >59 mL/min/1.73 Final         Passed - Valid encounter within last 6 months    Recent Outpatient Visits          1 week ago Uncontrolled type 2 diabetes mellitus with hyperglycemia ([place])   [place], Jolene T, NP   4 months ago Type 2 diabetes mellitus with proteinuria ([place])   [place], Jolene T, NP   8 months ago Uncontrolled type 2 diabetes mellitus with hyperglycemia ([place])   [place] [facility], Jolene T, NP   8 months ago Syncope, unspecified syncope type   [facility], Lauren A, NP   11 months ago Uncontrolled type 2 diabetes mellitus with hyperglycemia ([place])   [place], Barbaraann Faster, NP      Future Appointments            In 2 months [place], Barbaraann Faster, NP [facility]           . Lancets (ONETOUCH DELICA PLUS [id number]) Section [Pharmacy Med Name: Jonetta Speak PLUS [id number]] 100 each 12    Sig: USE AS DIRECTED.     Endocrinology: Diabetes - Testing Supplies Passed - 01/28/2021 12:33 PM      Passed - Valid encounter within last 12 months    Recent Outpatient Visits          1 week ago Uncontrolled type 2 diabetes mellitus with hyperglycemia ([place])  [place] [facility] [place], [employer] T, NP   4 months ago Type 2 diabetes mellitus with proteinuria ([place])   [place], [place] T, NP   8 months ago Uncontrolled type 2 diabetes mellitus with hyperglycemia ([place])   [place], [place] T, NP   8 months ago Syncope, unspecified syncope type   [facility], Lauren A, NP   11 months ago Uncontrolled type 2 diabetes mellitus with hyperglycemia ([place])   [place], Barbaraann Faster, NP      Future Appointments            In 2 months Cannady, Barbaraann Faster, NP [facility]           . EPINEPHRINE 0.3 mg/0.3 mL IJ SOAJ injection [Pharmacy Med Name: EPINEPHRINE 0.3 MG/0.3ML INJ SOLN] 2 each 1    Sig: INJECT 1 SYRINGE INTO OUTER THIGH ONCE AS NEEDED FOR SEVERE ALLERGIC REACTION.     Immunology: Antidotes Passed - 01/28/2021 12:33 PM      Passed - Valid encounter within last 12 months    Recent Outpatient Visits          1 week ago Uncontrolled type 2 diabetes mellitus with hyperglycemia ([place])   [place], Jolene T, NP   4 months ago Type 2 diabetes mellitus with proteinuria ([place])    [place], Jolene T, NP   8 months ago Uncontrolled type 2 diabetes mellitus with hyperglycemia ([place])   [place], Jolene T, NP   8 months ago Syncope, unspecified syncope type   [facility], Lauren A, NP   11 months ago Uncontrolled type 2 diabetes mellitus with hyperglycemia ([place])   [place], Barbaraann Faster, NP      Future Appointments            In 2 months Cannady, Barbaraann Faster, NP [facility]

## 2021-01-30 DIAGNOSIS — H16011 Central corneal ulcer, right eye: Secondary | ICD-10-CM | POA: Diagnosis not present

## 2021-01-30 DIAGNOSIS — H01001 Unspecified blepharitis right upper eyelid: Secondary | ICD-10-CM | POA: Diagnosis not present

## 2021-02-06 DIAGNOSIS — H16011 Central corneal ulcer, right eye: Secondary | ICD-10-CM | POA: Diagnosis not present

## 2021-02-07 ENCOUNTER — Telehealth: Payer: Self-pay

## 2021-02-07 NOTE — Chronic Care Management (AMB) (Signed)
  Care Management   Note  02/07/2021 Name: [name]. MRN: [id number] DOB: 31-Jul-1960  Alex Metro. is a 60 y.o. year old male who is a primary care patient of [name], NP and is actively engaged with the care management team. I reached out to [facility]. by phone today to assist with re-scheduling a follow up visit with the Pharmacist  Follow up plan: Unsuccessful telephone outreach attempt made. A HIPAA compliant phone message was left for the patient providing contact information and requesting a return call.  The care management team will reach out to the patient again over the next 7 days.  If patient returns call to provider office, please advise to call [place]  at [place], [place], [place], [place] [address] Direct Dial: [phone] [name] Website: [email or web address]

## 2021-02-08 ENCOUNTER — Telehealth: Payer: Medicare Other | Admitting: Nurse Practitioner

## 2021-02-08 DIAGNOSIS — I6523 Occlusion and stenosis of bilateral carotid arteries: Secondary | ICD-10-CM

## 2021-02-08 DIAGNOSIS — Z8673 Personal history of transient ischemic attack (TIA), and cerebral infarction without residual deficits: Secondary | ICD-10-CM

## 2021-02-08 DIAGNOSIS — E1165 Type 2 diabetes mellitus with hyperglycemia: Secondary | ICD-10-CM

## 2021-02-08 NOTE — Telephone Encounter (Signed)
Copied from [place] [id number]. Topic: Referral - Request for Referral >> Feb 08, 2021  1:31 PM Wynetta Emery, [place] C wrote: Pt's spouse [name] called in for assistance. Pt has been referred to a neurologist at [facility]. Spouse says that pt is not in network with [facility] and would instead like to have pt referred to Dr. Caleb Popp at [place] says that he is in network.

## 2021-02-20 DIAGNOSIS — H16011 Central corneal ulcer, right eye: Secondary | ICD-10-CM | POA: Diagnosis not present

## 2021-02-22 ENCOUNTER — Telehealth: Payer: Medicare Other

## 2021-02-22 ENCOUNTER — Ambulatory Visit: Payer: Self-pay

## 2021-02-22 DIAGNOSIS — E785 Hyperlipidemia, unspecified: Secondary | ICD-10-CM

## 2021-02-22 DIAGNOSIS — I152 Hypertension secondary to endocrine disorders: Secondary | ICD-10-CM

## 2021-02-22 DIAGNOSIS — E1159 Type 2 diabetes mellitus with other circulatory complications: Secondary | ICD-10-CM

## 2021-02-22 DIAGNOSIS — H44001 Unspecified purulent endophthalmitis, right eye: Secondary | ICD-10-CM

## 2021-02-22 DIAGNOSIS — E1165 Type 2 diabetes mellitus with hyperglycemia: Secondary | ICD-10-CM

## 2021-02-22 DIAGNOSIS — E1169 Type 2 diabetes mellitus with other specified complication: Secondary | ICD-10-CM

## 2021-02-22 NOTE — Chronic Care Management (AMB) (Signed)
Chronic Care Management   CCM RN Visit Note  02/22/2021 Name: [name]. MRN: [id number] DOB: 11-25-1960  Subjective: [name]. is a 60 y.o. year old male who is a primary care patient of Cannady, Barbaraann Faster, NP. The care management team was consulted for assistance with disease management and care coordination needs.    Engaged with patient by telephone for follow up visit in response to provider referral for case management and/or care coordination services.   Consent to Services:  The patient was given information about Chronic Care Management services, agreed to services, and gave verbal consent prior to initiation of services.  Please see initial visit note for detailed documentation.   Patient agreed to services and verbal consent obtained.   Assessment: Review of patient past medical history, allergies, medications, health status, including review of consultants reports, laboratory and other test data, was performed as part of comprehensive evaluation and provision of chronic care management services.   SDOH (Social Determinants of Health) assessments and interventions performed:    CCM Care Plan  Allergies  Allergen Reactions   Bee Venom Hives    All kinds of bees   Other     Certain powders   Sulfa Antibiotics     Outpatient Encounter Medications as of 02/22/2021  Medication Sig   aspirin 81 MG EC tablet Take 1 tablet (81 mg total) by mouth daily.   atorvastatin (LIPITOR) 80 MG tablet TAKE 1 TABLET BY MOUTH ONCE DAILY   Blood Glucose Monitoring Suppl (ONE TOUCH ULTRA 2) w/Device KIT Use to check blood sugar 4 times a day   BREO ELLIPTA 100-25 MCG/INH AEPB INHALE 1 PUFF BY MOUTH ONCE DAILY   Cholecalciferol 1.25 MG (50000 UT) TABS Take 1 tablet by mouth once a week.   clobetasol cream (TEMOVATE) 4.70 % Apply 1 application topically 2 (two) times daily. Apply to affected area   clopidogrel (PLAVIX) 75 MG tablet Take 1 tablet by mouth daily.    EPINEPHRINE 0.3 mg/0.3 mL IJ SOAJ injection INJECT 1 SYRINGE INTO OUTER THIGH ONCE AS NEEDED FOR SEVERE ALLERGIC REACTION.   fenofibrate 54 MG tablet TAKE 1 TABLET BY MOUTH ONCE DAILY   gabapentin (NEURONTIN) 300 MG capsule TAKE 1 CAPSULE BY MOUTH TWICE DAILY   glucose blood (ONETOUCH ULTRA) test strip To check blood sugar three times daily   insulin degludec (TRESIBA FLEXTOUCH) 100 UNIT/ML FlexTouch Pen INJECT 52 UNITS SUBCUTANEOUSLY ONCE A DAY (Patient taking differently: INJECT 62 UNITS SUBCUTANEOUSLY ONCE A DAY)   Insulin Pen Needle (PEN NEEDLES) 32G X 4 MM MISC 1 kit by Does not apply route daily.   JARDIANCE 25 MG TABS tablet TAKE 1 TABLET BY MOUTH ONCE DAILY   Lancets (ONETOUCH DELICA PLUS [id number]) MISC USE AS DIRECTED.   lisinopril (ZESTRIL) 20 MG tablet TAKE 1 TABLET BY MOUTH ONCE DAILY   Misc. Devices (PULSE OXIMETER FOR FINGER) MISC To check O2 saturations once daily with asthma and document + check if any shortness of breath   montelukast (SINGULAIR) 10 MG tablet TAKE 1 TABLET BY MOUTH AT BEDTIME   nitroGLYCERIN (NITROSTAT) 0.4 MG SL tablet Place 1 tablet (0.4 mg total) under the tongue every 5 (five) minutes as needed for chest pain.   omeprazole (PRILOSEC) 20 MG capsule TAKE 1 CAPSULE BY MOUTH ONCE DAILY   tiZANidine (ZANAFLEX) 2 MG tablet TAKE 1 TABLET BY MOUTH EVERY 6 HOURS AS NEEDED FOR MUSCLE SPASMS   trimethoprim-polymyxin b (POLYTRIM) ophthalmic solution Place 1  drop into the right eye every 6 (six) hours.    ULTRACARE PEN NEEDLES 32G X 5 MM MISC    No facility-administered encounter medications on file as of 02/22/2021.    Patient Active Problem List   Diagnosis Date Noted   Heart murmur 09/21/2020   Carotid artery bruit 09/21/2020   Type 2 diabetes mellitus with proteinuria ([place]) 03/25/2019   Atherosclerosis of both carotid arteries 03/22/2019   History of CVA (cerebrovascular accident) 03/06/2019   Uncontrolled type 2 diabetes mellitus with hyperglycemia ([place])     Obesity 09/03/2018   Frequent falls 05/15/2016   Vitamin D deficiency 08/22/2015   Carpal tunnel syndrome on left 05/12/2015   External hemorrhoids 12/20/2014   Allergic rhinitis 10/21/2014   Diverticulosis 10/21/2014   Hypertension associated with diabetes ([place]) 10/21/2014   Hyperlipidemia associated with type 2 diabetes mellitus ([place]) 10/21/2014   Asthma 10/21/2014   GERD (gastroesophageal reflux disease) 10/21/2014   NAFLD (nonalcoholic fatty liver disease) 10/21/2014   Arthritis 10/21/2014   History of pancreatitis 10/07/2014   Phenylketonuria (PKU) ([place]) 10/07/2014   Renal cyst, left 10/07/2014   Gall bladder polyp 10/01/2013    Conditions to be addressed/monitored:HTN, HLD, DMII, and Eye issues   Care Plan : RNCM; General Plan of Care (Adult) for Chronic Disease Management and Care Coordination Needs  Updates made by Vanita Ingles, RN since 02/22/2021 12:00 AM     Problem: RNCM: Development of Care Plan for Chronic Disease Management and Care Coordination Needs (DM, HTN, HLD, Eye sty/ulcer/cataract   Priority: High     Long-Range Goal: RNCM: Development of Care Plan for Chronic Disease Management and Care Coordination Needs (DM, HTN, HLD, Eye sty/ulcer/cataract   Start Date: 01/03/2021  Expected End Date: 01/03/2022  Priority: High  Note:   Current Barriers:  Knowledge Deficits related to plan of care for management of HTN, HLD, DMII, and eye sty/ulcer   Care Coordination needs related to Financial constraints related to limited income, Limited social support, Transportation, and Level of care concerns  Chronic Disease Management support and education needs related to HTN, HLD, DMII, and eye sty/ulcer- right Lacks 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 Follows up with eye specialist on 01-04-2021. 02-22-2021: Has medications and is taking as prescribed.  attend all scheduled medical appointments: 04-26-2021 at 2pm; also is still seeing the eye specialist weekly. demonstrate improved and ongoing adherence to prescribed treatment plan for HTN, HLD, DMII, and eye sty/ulcer as evidenced by daily monitoring and recording of CBG  adherence to ADA/ carb modified diet adherence to prescribed medication regimen contacting provider for new or worsened symptoms or questions  demonstrate improved and ongoing health management independence for chronic conditions as evidence of effective management and no acute exacerbations continue to work with Consulting civil engineer and/or Social Worker to address care management and care coordination needs related to HTN, HLD, DMII, and eye sty/ulcer  demonstrate a decrease in HTN, HLD, DMII, and eye styl/ulcer exacerbations  demonstrate ongoing self health  care management ability  through collaboration with RN Care manager, provider, and care team.   Interventions: 1:1 collaboration with primary care provider regarding development and update of comprehensive plan of care as evidenced by provider attestation and co-signature Inter-disciplinary care team collaboration (see longitudinal plan of care) Evaluation of current treatment plan related to  self management and patient's adherence to plan as established by provider   SDOH Barriers (Status: Goal on track: YES.)  Patient interviewed and SDOH assessment performed        SDOH Interventions    Flowsheet Row Most Recent Value  SDOH  Interventions   Food Insecurity Interventions Intervention Not Indicated  Financial Strain Interventions Other (Comment)  [continued support from care guides and resources]  Housing Interventions Intervention Not Indicated  Intimate Partner Violence Interventions Intervention Not Indicated  Physical Activity Interventions Other (Comments)  [is active but no structured activity]  Stress Interventions Intervention Not Indicated  Social Connections Interventions Intervention Not Indicated, Other (Comment)  [good support system]  Transportation Interventions [employer]     Patient interviewed and appropriate assessments performed Provided patient with information about resources available. The patient and his wife have worked with care guides before and know the process for getting help in the community for expressed needs Advised patient to call the office for changes in [place], questions or concerns, the patient states they are getting food stamps now and are thankful for the food stamps Provided education to patient/caregiver regarding level of care options.    Diabetes:  (Status: Goal on track: YES.) Lab Results  Component Value Date   HGBA1C 8.7 (H) 01/18/2021  Assessed patient's understanding of A1c goal: <7% Provided education to patient about basic DM disease process; Reviewed medications with patient and discussed importance of medication adherence;        Reviewed prescribed diet with patient heart healthy/ADA diet; Counseled on importance of regular laboratory monitoring as prescribed;        Discussed plans with patient for ongoing care management follow up and provided patient with direct contact information for care management team;      Provided patient with written educational materials related to hypo and hyperglycemia and importance of correct treatment. 02-22-2021: The patient denies any lows. States the highest he has had was a little greater than 200;        Reviewed scheduled/upcoming provider appointments including: 04-26-2021 at 2 pm;         Advised patient, providing education and rationale, to check cbg as directed and record. 01-03-2021: The patient checked his blood sugar while on the phone with the [facility] and it was 149. The patient states he has been in the 100's range and hardly ever over 200. Praised the patient for positive changes. Will have a new A1C coming up for follow up with the pcp on 01-18-2021. Denies any acute findings. States he is eating healthier and staying away from sodas currently.   02-22-2021: Alc trending down, In office in November the A1C was 8.7 down from 9.9.  The patients blood sugar this am was 165. The patient and patients wife encouraged to keep regular checks on blood sugars and review of goal of <130 fasting and <180 post prandial.      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 Discussed plans with patient for ongoing care management follow up and provided patient with direct contact information for care management team Advised patient to call the specialist for changes in his vision, pain, or concerns related to right eye with clogged sty and ulcer noted; Provided education to patient re: keeping appointments, monitoring for changes in vision or other complications; Reviewed medications with patient and discussed 01-03-2021: The patient states that he just got his abx drops today for his  eye and he is using them. States that they were on back order. He can see some today out of his right eye, education and support given. 02-22-2021: The patient is seeing the specialist weekly and is taking medications as prescribed ; Reviewed scheduled/upcoming provider appointments including weekly with specialist with anticipation of cataract surgery in March of 2023; Discussed plans with patient for ongoing care management follow up and provided patient with direct contact information for care management team;  Hyperlipidemia:  (Status: Goal on Track (progressing): YES.) Lab Results  Component Value Date   CHOL 147 01/18/2021   HDL 36 (L) 01/18/2021   LDLCALC 72 01/18/2021   TRIG 237 (H) 01/18/2021   CHOLHDL 5.3 03/03/2019     Medication review performed; medication list updated in electronic medical record.  Provider established cholesterol goals reviewed. 02-22-2021: Education and support given; Counseled on importance of regular laboratory monitoring as prescribed; Provided HLD educational materials; Reviewed role and benefits of statin for ASCVD risk reduction; Discussed strategies to manage statin-induced myalgias; Reviewed importance of limiting foods high in cholesterol. 02-22-2021: Education provided on Heart Healthy/ADA diet  Reviewed exercise goals and target of 150 minutes per week;  Hypertension: (Status: Goal on track: YES.) Last practice recorded BP readings:  BP Readings from Last 3 Encounters:  01/18/21 112/65  09/21/20 106/64  08/10/20 126/62  Most recent eGFR/CrCl:  Lab Results  Component Value Date   EGFR 67 01/18/2021    No components found for: CRCL  Evaluation of current treatment plan related to hypertension self management and patient's adherence to plan as established by provider. 02-22-2021: The patient is doing well and has no question related to heart health. Will reschedule follow up with specialist to have an ECHO. Missed appointment due to having  issues with his eye. Denies any acute findings;   Provided education to patient re: stroke prevention, s/s of heart attack and stroke; Reviewed prescribed diet heart healthy/ADA. 02-22-2021: Review  Reviewed medications with patient and discussed importance of compliance. 02-22-2021: States compliance with medications, wife assist with medications management   Counseled on adverse effects of illicit drug and excessive alcohol use in patients with high blood pressure;  Counseled on the importance of exercise goals with target of 150 minutes per week Discussed plans with patient for ongoing care management follow up and provided patient with direct contact information for care management team; Advised patient, providing education and rationale, to monitor blood pressure daily and record, calling PCP for findings outside established parameters;  Reviewed scheduled/upcoming provider appointments including: 04-26-2021 at 2 pm  Patient Goals/Self-Care Activities: Patient will self administer medications as prescribed as  evidenced by self report/primary caregiver report  Patient will attend all scheduled provider appointments as evidenced by clinician review of documented attendance to scheduled appointments and patient/caregiver report Patient will call pharmacy for medication refills as evidenced by patient report and review of pharmacy fill history as appropriate Patient will attend church or other social activities as evidenced by patient report Patient will continue to perform ADL's independently as evidenced by patient/caregiver report Patient will continue to perform IADL's independently as evidenced by patient/caregiver report Patient will call provider office for new concerns or questions as evidenced by review of documented incoming telephone call notes and patient report Patient will work with BSW to address care coordination needs and will continue to work with the clinical team to address health  care and disease management related needs as evidenced by documented adherence to scheduled care management/care coordination appointments - keep appointment with eye doctor - check blood sugar at prescribed times: before meals and at bedtime, when you have symptoms of low or high blood sugar, and before and after exercise - check feet daily for cuts, sores or redness - enter blood sugar readings and medication or insulin into daily log - take the blood sugar log to all doctor visits - trim toenails straight across - drink 6 to 8 glasses of water each day - eat fish at least once per week - fill half of plate with vegetables - limit fast food meals to no more than 1 per week - manage portion size - prepare main meal at home 3 to 5 days each week - read food labels for fat, fiber, carbohydrates and portion size - reduce red meat to 2 to 3 times a week - keep feet up while sitting - wash and dry feet carefully every day - wear comfortable, cotton socks - wear comfortable, well-fitting shoes - check blood pressure weekly - choose a place to take my blood pressure (home, clinic or office, retail store) - write blood pressure results in a log or diary - learn about high blood pressure - keep a blood pressure log - take blood pressure log to all doctor appointments - call doctor for signs and symptoms of high blood pressure - keep all doctor appointments - take medications for blood pressure exactly as prescribed - report new symptoms to your doctor - eat more whole grains, fruits and vegetables, lean meats and healthy fats - call for medicine refill 2 or 3 days before it runs out - take all medications exactly as prescribed - call doctor with any symptoms you believe are related to your medicine - call doctor when you experience any new symptoms - go to all doctor appointments as scheduled - adhere to prescribed diet: heart healthy/ADA diet        Plan:Telephone follow up  appointment with care management team member scheduled for:  04-12-2021 at 145 pm  [place], MSN, [facility] Mobile: [phone]

## 2021-02-22 NOTE — Patient Instructions (Signed)
Visit Information  Thank you for taking time to visit with me today. Please don't hesitate to contact me if I can be of assistance to you before our next scheduled telephone appointment.  Following are the goals we discussed today:  RNCM Clinical Goal(s):  Patient will verbalize understanding of plan for management of HTN, HLD, DMII, and right eye ulcer/sty  verbalize basic understanding of HTN, HLD, DMII, and right eye ulcer/sty disease process and self health management plan  take all medications exactly as prescribed and will call provider for medication related questions demonstrate understanding of rationale for each prescribed medication 01-03-2021: The patient was just able to get his medications today for his eye as the medication was on back order. Follows up with eye specialist on 01-04-2021. 02-22-2021: Has medications and is taking as prescribed.  attend all scheduled medical appointments: 04-26-2021 at 2pm; also is still seeing the eye specialist weekly. demonstrate improved and ongoing adherence to prescribed treatment plan for HTN, HLD, DMII, and eye sty/ulcer as evidenced by daily monitoring and recording of CBG  adherence to ADA/ carb modified diet adherence to prescribed medication regimen contacting provider for new or worsened symptoms or questions  demonstrate improved and ongoing health management independence for chronic conditions as evidence of effective management and no acute exacerbations continue to work with Consulting civil engineer and/or Social Worker to address care management and care coordination needs related to HTN, HLD, DMII, and eye sty/ulcer  demonstrate a decrease in HTN, HLD, DMII, and eye styl/ulcer exacerbations  demonstrate ongoing self health care management ability  through collaboration with RN Care manager, provider, and care team.    Interventions: 1:1 collaboration with primary care provider regarding development and update of comprehensive plan of care as  evidenced by provider attestation and co-signature Inter-disciplinary care team collaboration (see longitudinal plan of care) Evaluation of current treatment plan related to  self management and patient's adherence to plan as established by provider     SDOH Barriers (Status: Goal on track: YES.)  Patient interviewed and SDOH assessment performed        SDOH Interventions     Flowsheet Row Most Recent Value  SDOH Interventions    Food Insecurity Interventions Intervention Not Indicated  Financial Strain Interventions Other (Comment)  [continued support from care guides and resources]  Housing Interventions Intervention Not Indicated  Intimate Partner Violence Interventions Intervention Not Indicated  Physical Activity Interventions Other (Comments)  [is active but no structured activity]  Stress Interventions Intervention Not Indicated  Social Connections Interventions Intervention Not Indicated, Other (Comment)  [good support system]  Transportation Interventions [employer]       Patient interviewed and appropriate assessments performed Provided patient with information about resources available. The patient and his wife have worked with care guides before and know the process for getting help in the community for expressed needs Advised patient to call the office for changes in [place], questions or concerns, the patient states they are getting food stamps now and are thankful for the food stamps Provided education to patient/caregiver regarding level of care options.       Diabetes:  (Status: Goal on track: YES.)      Lab Results  Component Value Date    HGBA1C 8.7 (H) 01/18/2021  Assessed patient's understanding of A1c goal: <7% Provided education to patient about basic DM disease process; Reviewed medications with patient and discussed importance of medication adherence;        Reviewed prescribed diet with patient  heart healthy/ADA diet; Counseled on  importance of regular laboratory monitoring as prescribed;        Discussed plans with patient for ongoing care management follow up and provided patient with direct contact information for care management team;      Provided patient with written educational materials related to hypo and hyperglycemia and importance of correct treatment. 02-22-2021: The patient denies any lows. States the highest he has had was a little greater than 200;       Reviewed scheduled/upcoming provider appointments including: 04-26-2021 at 2 pm;         Advised patient, providing education and rationale, to check cbg as directed and record. 01-03-2021: The patient checked his blood sugar while on the phone with the [facility] and it was 149. The patient states he has been in the 100's range and hardly ever over 200. Praised the patient for positive changes. Will have a new A1C coming up for follow up with the pcp on 01-18-2021. Denies any acute findings. States he is eating healthier and staying away from sodas currently.   02-22-2021: Alc trending down, In office in November the A1C was 8.7 down from 9.9.  The patients blood sugar this am was 165. The patient and patients wife encouraged to keep regular checks on blood sugars and review of goal of <130 fasting and <180 post prandial.      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 Discussed plans with patient for ongoing care management follow up and  provided patient with direct contact information for care management team Advised patient to call the specialist for changes in his vision, pain, or concerns related to right eye with clogged sty and ulcer noted; Provided education to patient re: keeping appointments, monitoring for changes in vision or other complications; Reviewed medications with patient and discussed 01-03-2021: The patient states that he just got his abx drops today for his eye and he is using them. States that they were on back order. He can see some today out of his right eye, education and support given. 02-22-2021: The patient is seeing the specialist weekly and is taking medications as prescribed ; Reviewed scheduled/upcoming provider appointments including weekly with specialist with anticipation of cataract surgery in March of 2023; Discussed plans with patient for ongoing care management follow up and provided patient with direct contact information for care management team;   Hyperlipidemia:  (Status: Goal on Track (progressing): YES.)      Lab Results  Component Value Date    CHOL 147 01/18/2021    HDL 36 (L) 01/18/2021    LDLCALC 72 01/18/2021    TRIG 237 (H) 01/18/2021    CHOLHDL 5.3 03/03/2019      Medication review performed; medication list updated in electronic medical record.  Provider established cholesterol goals reviewed. 02-22-2021: Education and support given; Counseled on importance of regular laboratory monitoring as prescribed;  Provided HLD educational materials; Reviewed role and benefits of statin for ASCVD risk reduction; Discussed strategies to manage statin-induced myalgias; Reviewed importance of limiting foods high in cholesterol. 02-22-2021: Education provided on Heart Healthy/ADA diet  Reviewed exercise goals and target of 150 minutes per week;   Hypertension: (Status: Goal on track: YES.) Last practice recorded BP readings:     BP Readings from Last 3 Encounters:  01/18/21 112/65   09/21/20 106/64  08/10/20 126/62  Most recent eGFR/CrCl:       Lab Results  Component Value Date    EGFR 67 01/18/2021    No components found for: CRCL   Evaluation of current treatment plan related to hypertension self management and patient's adherence to plan as established by provider. 02-22-2021: The patient is doing well and has no question related to heart health. Will reschedule follow up with specialist to have an ECHO. Missed appointment due to having issues with his eye. Denies any acute findings;   Provided education to patient re: stroke prevention, s/s of heart attack and stroke; Reviewed prescribed diet heart healthy/ADA. 02-22-2021: Review  Reviewed medications with patient and discussed importance of compliance. 02-22-2021: States compliance with medications, wife assist with medications management   Counseled on adverse effects of illicit drug and excessive alcohol use in patients with high blood pressure;  Counseled on the importance of exercise goals with target of 150 minutes per week Discussed plans with patient for ongoing care management follow up and provided patient with direct contact information for care management team; Advised patient, providing education and rationale, to monitor blood pressure daily and record, calling PCP for findings outside established parameters;  Reviewed scheduled/upcoming provider appointments including: 04-26-2021 at 2 pm   Patient Goals/Self-Care Activities: Patient will self administer medications as prescribed as evidenced by self report/primary caregiver report  Patient will attend all scheduled provider appointments as evidenced by clinician review of documented attendance to scheduled appointments and patient/caregiver report Patient will call pharmacy for medication refills as evidenced by patient report and review of pharmacy fill history as appropriate Patient will attend church or other social activities as evidenced by patient  report Patient will continue to perform ADL's independently as evidenced by patient/caregiver report Patient will continue to perform IADL's independently as evidenced by patient/caregiver report Patient will call provider office for new concerns or questions as evidenced by review of documented incoming telephone call notes and patient report Patient will work with BSW to address care coordination needs and will continue to work with the clinical team to address health care and disease management related needs as evidenced by documented adherence to scheduled care management/care coordination appointments - keep appointment with eye doctor - check blood sugar at prescribed times: before meals and at bedtime, when you have symptoms of low or high blood sugar, and before and after exercise - check feet daily for cuts, sores or redness - enter blood sugar readings and medication or insulin into daily log - take the blood sugar log to all doctor visits - trim toenails straight across - drink 6 to 8 glasses of water each day - eat fish at least once per week - fill half of plate with vegetables - limit fast food meals to no more than 1 per week - manage portion size - prepare main meal at home 3 to 5 days each week - read food labels for fat, fiber, carbohydrates and portion size - reduce red meat to 2 to 3 times a week -  keep feet up while sitting - wash and dry feet carefully every day - wear comfortable, cotton socks - wear comfortable, well-fitting shoes - check blood pressure weekly - choose a place to take my blood pressure (home, clinic or office, retail store) - write blood pressure results in a log or diary - learn about high blood pressure - keep a blood pressure log - take blood pressure log to all doctor appointments - call doctor for signs and symptoms of high blood pressure - keep all doctor appointments - take medications for blood pressure exactly as prescribed - report  new symptoms to your doctor - eat more whole grains, fruits and vegetables, lean meats and healthy fats - call for medicine refill 2 or 3 days before it runs out - take all medications exactly as prescribed - call doctor with any symptoms you believe are related to your medicine - call doctor when you experience any new symptoms - go to all doctor appointments as scheduled - adhere to prescribed diet: heart healthy/ADA diet   Our next appointment is by telephone on 04-12-2021 at 1:45 pm  Please call the care guide team at [phone] if you need to cancel or reschedule your appointment.   If you are experiencing a Mental Health or [place] or need someone to talk to, please call the Suicide and Crisis Lifeline: 988 call the [place] National Suicide Prevention Lifeline: 8635747081 or TTY: (435)545-7080 TTY 7637830098) to talk to a trained counselor call 1-800-273-TALK (toll free, 24 hour hotline)   The patient verbalized understanding of instructions, educational materials, and care plan provided today and declined offer to receive copy of patient instructions, educational materials, and care plan.   Noreene Larsson RN, MSN, [facility] Mobile: [phone]

## 2021-02-24 ENCOUNTER — Other Ambulatory Visit: Payer: Self-pay | Admitting: Nurse Practitioner

## 2021-02-24 ENCOUNTER — Ambulatory Visit: Payer: Medicare Other | Admitting: Licensed Clinical Social Worker

## 2021-02-24 DIAGNOSIS — E1159 Type 2 diabetes mellitus with other circulatory complications: Secondary | ICD-10-CM

## 2021-02-24 DIAGNOSIS — E701 Other hyperphenylalaninemias: Secondary | ICD-10-CM

## 2021-02-24 DIAGNOSIS — E1165 Type 2 diabetes mellitus with hyperglycemia: Secondary | ICD-10-CM

## 2021-02-24 DIAGNOSIS — R062 Wheezing: Secondary | ICD-10-CM

## 2021-02-24 DIAGNOSIS — I152 Hypertension secondary to endocrine disorders: Secondary | ICD-10-CM

## 2021-02-24 DIAGNOSIS — Z8673 Personal history of transient ischemic attack (TIA), and cerebral infarction without residual deficits: Secondary | ICD-10-CM

## 2021-02-24 DIAGNOSIS — F4321 Adjustment disorder with depressed mood: Secondary | ICD-10-CM

## 2021-02-24 NOTE — Patient Instructions (Addendum)
Visit Information  Thank you for taking time to visit with me today. Please don't hesitate to contact me if I can be of assistance to you before our next scheduled telephone appointment.  Following are the goals we discussed today:  Patient Self Care Activities:  Attend all scheduled provider appointments Call provider office for new concerns or questions Continue to utilize healthy coping skills to assist in management of symptoms  Our next appointment is by telephone on 05/08/21 at 10:45 PM  Please call the care guide team at [phone] if you need to cancel or reschedule your appointment.   If you are experiencing a Mental Health or [place] or need someone to talk to, please call the Suicide and Crisis Lifeline: 988 call 911   Patient verbalizes understanding of instructions provided today  Christa See, MSW, [email or web address] Phone [phone] 11:11 AM

## 2021-02-24 NOTE — Chronic Care Management (AMB) (Signed)
Chronic Care Management    Clinical Social Work Note  02/24/2021 Name: [name]. MRN: [id number] DOB: 26-Nov-1960  Alex Metro. is a 60 y.o. year old male who is a primary care patient of Cannady, Barbaraann Faster, NP. The CCM team was consulted to assist the patient with chronic disease management and/or care coordination needs related to: Grief Counseling.   Engaged with patient's spouse and patient by telephone for follow up visit in response to provider referral for social work chronic care management and care coordination services.   Consent to Services:  The patient was given information about Chronic Care Management services, agreed to services, and gave verbal consent prior to initiation of services.  Please see initial visit note for detailed documentation.   Patient agreed to services and consent obtained.   Consent to Services:  The patient was given information about Care Management services, agreed to services, and gave verbal consent prior to initiation of services.  Please see initial visit note for detailed documentation.   Patient agreed to services today and consent obtained.  Engaged with patient and his spouse by phone in response to provider referral for social work care coordination services:  Assessment/Interventions:  Patient continues to maintain positive progress with care plan goals. CCM LCSW discussed strategies to cope with grief during holidays. Patient continues to log blood sugars and blood pressure to assist with management of health conditions.  See Care Plan below for interventions and patient self-care activities.  Recent life changes or stressors: Grief  Recommendation: Patient may benefit from, and is in agreement work with LCSW to address care coordination needs and will continue to work with the clinical team to address health care and disease management related needs.   Follow up Plan: Patient would like continued follow-up from CCM  LCSW.  per patient's request will follow up in 05/08/21.  Will call office if needed prior to next encounter.   SDOH (Social Determinants of Health) assessments and interventions performed:    Advanced Directives Status: Not addressed in this encounter.  CCM Care Plan  Allergies  Allergen Reactions   Bee Venom Hives    All kinds of bees   Other     Certain powders   Sulfa Antibiotics     Outpatient Encounter Medications as of 02/24/2021  Medication Sig   aspirin 81 MG EC tablet Take 1 tablet (81 mg total) by mouth daily.   atorvastatin (LIPITOR) 80 MG tablet TAKE 1 TABLET BY MOUTH ONCE DAILY   Blood Glucose Monitoring Suppl (ONE TOUCH ULTRA 2) w/Device KIT Use to check blood sugar 4 times a day   BREO ELLIPTA 100-25 MCG/INH AEPB INHALE 1 PUFF BY MOUTH ONCE DAILY   Cholecalciferol 1.25 MG (50000 UT) TABS Take 1 tablet by mouth once a week.   clobetasol cream (TEMOVATE) 1.60 % Apply 1 application topically 2 (two) times daily. Apply to affected area   clopidogrel (PLAVIX) 75 MG tablet Take 1 tablet by mouth daily.   EPINEPHRINE 0.3 mg/0.3 mL IJ SOAJ injection INJECT 1 SYRINGE INTO OUTER THIGH ONCE AS NEEDED FOR SEVERE ALLERGIC REACTION.   fenofibrate 54 MG tablet TAKE 1 TABLET BY MOUTH ONCE DAILY   gabapentin (NEURONTIN) 300 MG capsule TAKE 1 CAPSULE BY MOUTH TWICE DAILY   glucose blood (ONETOUCH ULTRA) test strip To check blood sugar three times daily   insulin degludec (TRESIBA FLEXTOUCH) 100 UNIT/ML FlexTouch Pen INJECT 52 UNITS SUBCUTANEOUSLY ONCE A DAY (Patient taking differently: INJECT 62  UNITS SUBCUTANEOUSLY ONCE A DAY)   Insulin Pen Needle (PEN NEEDLES) 32G X 4 MM MISC 1 kit by Does not apply route daily.   JARDIANCE 25 MG TABS tablet TAKE 1 TABLET BY MOUTH ONCE DAILY   Lancets (ONETOUCH DELICA PLUS [id number]) MISC USE AS DIRECTED.   lisinopril (ZESTRIL) 20 MG tablet TAKE 1 TABLET BY MOUTH ONCE DAILY   Misc. Devices (PULSE OXIMETER FOR FINGER) MISC To check O2 saturations  once daily with asthma and document + check if any shortness of breath   montelukast (SINGULAIR) 10 MG tablet TAKE 1 TABLET BY MOUTH AT BEDTIME   nitroGLYCERIN (NITROSTAT) 0.4 MG SL tablet Place 1 tablet (0.4 mg total) under the tongue every 5 (five) minutes as needed for chest pain.   omeprazole (PRILOSEC) 20 MG capsule TAKE 1 CAPSULE BY MOUTH ONCE DAILY   tiZANidine (ZANAFLEX) 2 MG tablet TAKE 1 TABLET BY MOUTH EVERY 6 HOURS AS NEEDED FOR MUSCLE SPASMS   trimethoprim-polymyxin b (POLYTRIM) ophthalmic solution Place 1 drop into the right eye every 6 (six) hours.    ULTRACARE PEN NEEDLES 32G X 5 MM MISC    No facility-administered encounter medications on file as of 02/24/2021.    Patient Active Problem List   Diagnosis Date Noted   Heart murmur 09/21/2020   Carotid artery bruit 09/21/2020   Type 2 diabetes mellitus with proteinuria ([place]) 03/25/2019   Atherosclerosis of both carotid arteries 03/22/2019   History of CVA (cerebrovascular accident) 03/06/2019   Uncontrolled type 2 diabetes mellitus with hyperglycemia ([place])    Obesity 09/03/2018   Frequent falls 05/15/2016   Vitamin D deficiency 08/22/2015   Carpal tunnel syndrome on left 05/12/2015   External hemorrhoids 12/20/2014   Allergic rhinitis 10/21/2014   Diverticulosis 10/21/2014   Hypertension associated with diabetes ([place]) 10/21/2014   Hyperlipidemia associated with type 2 diabetes mellitus ([place]) 10/21/2014   Asthma 10/21/2014   GERD (gastroesophageal reflux disease) 10/21/2014   NAFLD (nonalcoholic fatty liver disease) 10/21/2014   Arthritis 10/21/2014   History of pancreatitis 10/07/2014   Phenylketonuria (PKU) ([place]) 10/07/2014   Renal cyst, left 10/07/2014   Gall bladder polyp 10/01/2013    Conditions to be addressed/monitored: HTN and DMII; Cognitive Deficits and Grief  Care Plan : General Social Work (Adult)  Updates made by Rebekah Chesterfield, LCSW since 02/24/2021 12:00 AM     Problem: Coping Skills (General  Plan of Care)      Long-Range Goal: Coping Skills Enhanced   Start Date: 05/16/2020  This Visit's Progress: On track  Recent Progress: On track  Priority: Medium  Note:   Current Barriers:  Financial constraints Limited social support Level of care concerns ADL IADL limitations Literacy concerns Cognitive Deficits Lacks knowledge of community resource: available financial support resources within the area as well as socialization opportunities within the area Grief support related to the recent loss of his mother Clinical Social Work Clinical Goal(s):  Over the next 120 days, client will work with SW to address concerns related to improving self-care and implementing it whenever able Interventions: Patient's spouse was interviewed in regards to patient and appropriate assessments performed Patient has been doing well managing his blood sugars and been staying in a good mood. Per spouse, patient is often dancing and singing around the house. The family have began to decorate for the upcoming holidays 12/09: This is pt's first Christmas since the passing of his mother. Strategies to cope with grief during holidays Patient experiences anxiety regarding upcoming procedure  on knee scheduled next month. Concerned about limitations during the healing process (approx. 4-6 weeks) 09/30: Patient is healing from recent surgery. Spouse states he takes Tylenol extra strength and ibuprofen when he is feeling sore. Spouse cleanses his wound. Patient has a follow up appt with surgeon on Monday, October 3, ,22 to remove stiches Spouse reports strain paying the increase in rent, in addition, to medical bills. "It's been a struggle". Per patient, charity care denied assistance until bills are $5000 12/09: Family receives support from family and friends. Utilizes local food pantries Patient's blood sugars have improved since he has been actively monitoring what he eats and drinks to promote health and  well-being. His sleep has improved 12/09: Patient's wife shared they continue to log blood sugars and check blood pressure Patient has been mowing the yard and using exercise bike to promote activity Patient utilizes pantries to obtain food, when needed 08/31: Family receives assistance from local pantries and rescue missions every month. They are considering applying for Food Stamps and are interested in [employer] assistance-LCSW agreed to look into this 09/30: Patient continues to mindfulness with diet to assist with management of diabetes. Family has applied for SNAP benefits to assist with food insecurity. CCM LCSW informed spouse of [facility] [employer] to ensure he participates in upcoming medical appointments. Spouse shared she will let CCM LCSW know if a referral is needed in the future Family receives strong support from friends who assist with clothing Strategies to assist with promoting mood and stress management was discussed. Family agreed to practice gratitude thinking on a routine basis CCM LCSW reviewed upcoming appointments  Discussed plans with patient for ongoing care management follow up and provided patient with direct contact information for care management team Advised patient's spouse to contact CCM embedded practice providers and PCP if any urgent concerns arise. Brief self-care education provided to spouse during outreach today. Positive reinforcement provided for positive self-care implementation.  Patient Self Care Activities:  Attend all scheduled provider appointments Call provider office for new concerns or questions Continue to utilize healthy coping skills to assist in management of symptoms       Christa See, MSW, [email or web address] Phone [phone] 1:08 PM

## 2021-02-24 NOTE — Telephone Encounter (Signed)
Requested Prescriptions  Pending Prescriptions Disp Refills  . BREO ELLIPTA 100-25 MCG/ACT AEPB [Pharmacy Med Name: BREO ELLIPTA 100-25 MCG/ACT INH AEP] 60 each 2    Sig: INHALE 1 PUFF BY MOUTH ONCE DAILY     There is no refill protocol information for this order    . montelukast (SINGULAIR) 10 MG tablet [Pharmacy Med Name: MONTELUKAST SODIUM 10 MG TAB] 90 tablet 1    Sig: TAKE 1 TABLET BY MOUTH AT BEDTIME     Pulmonology:  Leukotriene Inhibitors Passed - 02/24/2021  7:47 PM      Passed - Valid encounter within last 12 months    Recent Outpatient Visits          1 month ago Uncontrolled type 2 diabetes mellitus with hyperglycemia ([place])   [place], Jolene T, NP   5 months ago Type 2 diabetes mellitus with proteinuria ([place])   [place], Jolene T, NP   8 months ago Uncontrolled type 2 diabetes mellitus with hyperglycemia ([place])   [place], Jolene T, NP   9 months ago Syncope, unspecified syncope type   [place], NP   1 year ago Uncontrolled type 2 diabetes mellitus with hyperglycemia ([place])   [place], Barbaraann Faster, NP      Future Appointments            In 2 months Cannady, Barbaraann Faster, NP [facility], [facility]

## 2021-02-27 ENCOUNTER — Telehealth: Payer: Medicare Other

## 2021-03-10 NOTE — Chronic Care Management (AMB) (Signed)
Care Management   Note  03/10/2021 Name: [name]. MRN: [id number] DOB: June 06, 1960  Alex Metro. is a 60 y.o. year old male who is a primary care patient of [name], NP and is actively engaged with the care management team. I reached out to [facility]. by phone today to assist with re-scheduling a follow up visit with the Pharmacist  Follow up plan: Telephone appointment with care management team member scheduled for:04/17/2021  Noreene Larsson, [place], [place], [place] [id number] Direct Dial: [phone] [email or web address] Website: [email or web address]

## 2021-03-15 DIAGNOSIS — H16011 Central corneal ulcer, right eye: Secondary | ICD-10-CM | POA: Diagnosis not present

## 2021-03-28 ENCOUNTER — Other Ambulatory Visit: Payer: Self-pay | Admitting: Nurse Practitioner

## 2021-03-28 NOTE — Telephone Encounter (Signed)
Requested Prescriptions  Pending Prescriptions Disp Refills   TRESIBA FLEXTOUCH 100 UNIT/ML FlexTouch Pen [Pharmacy Med Name: TRESIBA FLEXTOUCH 100 UNIT/ML SUBQ] 9 mL 4    Sig: INJECT [place] A DAY     Endocrinology:  Diabetes - Insulins Failed - 03/28/2021 12:11 PM      Failed - HBA1C is between 0 and 7.9 and within 180 days    HB A1C (BAYER DCA - WAIVED)  Date Value Ref Range Status  01/18/2021 8.7 (H) 4.8 - 5.6 % Final    Comment:             Prediabetes: 5.7 - 6.4          Diabetes: >6.4          Glycemic control for adults with diabetes: <7.0          Passed - Valid encounter within last 6 months    Recent Outpatient Visits          2 months ago Uncontrolled type 2 diabetes mellitus with hyperglycemia ([place])   [place], Jolene T, NP   6 months ago Type 2 diabetes mellitus with proteinuria ([place])   [place], Jolene T, NP   10 months ago Uncontrolled type 2 diabetes mellitus with hyperglycemia ([place])   [place], Jolene T, NP   10 months ago Syncope, unspecified syncope type   [place] McElwee, Lauren A, NP   1 year ago Uncontrolled type 2 diabetes mellitus with hyperglycemia ([place])   [place], [place] T, NP      Future Appointments            In 4 weeks Cannady, Barbaraann Faster, NP [place], [facility]            JARDIANCE 25 MG TABS tablet [employer] Med Name: JARDIANCE 25 MG TAB] 90 tablet 0    Sig: TAKE 1 TABLET BY MOUTH ONCE DAILY     Endocrinology:  Diabetes - SGLT2 Inhibitors Failed - 03/28/2021 12:11 PM      Failed - HBA1C is between 0 and 7.9 and within 180 days    HB A1C (BAYER DCA - WAIVED)  Date Value Ref Range Status  01/18/2021 8.7 (H) 4.8 - 5.6 % Final    Comment:             Prediabetes: 5.7 - 6.4          Diabetes: >6.4          Glycemic control for adults with diabetes: <7.0          Passed - Cr in normal range  and within 360 days    Creatinine, Ser  Date Value Ref Range Status  01/18/2021 1.25 0.76 - 1.27 mg/dL Final   Creatinine, POC  Date Value Ref Range Status  08/22/2015 n/a mg/dL Final         Passed - LDL in normal range and within 360 days    LDL Chol Calc (NIH)  Date Value Ref Range Status  01/18/2021 72 0 - 99 mg/dL Final         Passed - eGFR in normal range and within 360 days    GFR calc Af Amer  Date Value Ref Range Status  02/24/2020 76 >59 mL/min/1.73 Final    Comment:    **In accordance with recommendations from the NKF-ASN Task force,**   [facility] is in the process of updating its eGFR  calculation to the   2021 CKD-EPI creatinine equation that estimates kidney function   without a race variable.    GFR calc non Af Amer  Date Value Ref Range Status  02/24/2020 66 >59 mL/min/1.73 Final   eGFR  Date Value Ref Range Status  01/18/2021 67 >59 mL/min/1.73 Final         Passed - Valid encounter within last 6 months    Recent Outpatient Visits          2 months ago Uncontrolled type 2 diabetes mellitus with hyperglycemia ([place])   [place], Jolene T, NP   6 months ago Type 2 diabetes mellitus with proteinuria ([place])   [place], Jolene T, NP   10 months ago Uncontrolled type 2 diabetes mellitus with hyperglycemia ([place])   [place], Jolene T, NP   10 months ago Syncope, unspecified syncope type   [place] McElwee, Lauren A, NP   1 year ago Uncontrolled type 2 diabetes mellitus with hyperglycemia ([place])   [place], Barbaraann Faster, NP      Future Appointments            In 4 weeks Cannady, Barbaraann Faster, NP [place], [facility]            gabapentin (NEURONTIN) 300 MG capsule [Pharmacy Med Name: GABAPENTIN 300 MG CAP] 180 capsule 0    Sig: TAKE 1 CAPSULE BY MOUTH TWICE DAILY     Neurology: Anticonvulsants - gabapentin Passed - 03/28/2021 12:11 PM       Passed - Valid encounter within last 12 months    Recent Outpatient Visits          2 months ago Uncontrolled type 2 diabetes mellitus with hyperglycemia ([place])   [place], Jolene T, NP   6 months ago Type 2 diabetes mellitus with proteinuria ([place])   [place], Jolene T, NP   10 months ago Uncontrolled type 2 diabetes mellitus with hyperglycemia ([place])   [place], Jolene T, NP   10 months ago Syncope, unspecified syncope type   [place] McElwee, Lauren A, NP   1 year ago Uncontrolled type 2 diabetes mellitus with hyperglycemia ([place])   [place], Barbaraann Faster, NP      Future Appointments            In 4 weeks Cannady, Barbaraann Faster, NP [place], [facility]            omeprazole (PRILOSEC) 20 MG capsule [Pharmacy Med Name: OMEPRAZOLE DR 20 MG CAP] 30 capsule 2    Sig: TAKE 1 CAPSULE BY MOUTH ONCE DAILY     Gastroenterology: Proton Pump Inhibitors Passed - 03/28/2021 12:11 PM      Passed - Valid encounter within last 12 months    Recent Outpatient Visits          2 months ago Uncontrolled type 2 diabetes mellitus with hyperglycemia ([place])   [place], Jolene T, NP   6 months ago Type 2 diabetes mellitus with proteinuria ([place])   [place], Jolene T, NP   10 months ago Uncontrolled type 2 diabetes mellitus with hyperglycemia ([place])   [place], Jolene T, NP   10 months ago Syncope, unspecified syncope type   [place], NP   1 year ago Uncontrolled type 2 diabetes mellitus with hyperglycemia (  [place])   [place], Henrine Screws T, NP      Future Appointments            In 4 weeks Cannady, Barbaraann Faster, NP [facility], [facility]            fenofibrate 54 MG tablet [Pharmacy Med Name: FENOFIBRATE 54 MG TAB] 90 tablet 0    Sig: TAKE 1 TABLET BY MOUTH  ONCE DAILY     Cardiovascular:  Antilipid - Fibric Acid Derivatives Failed - 03/28/2021 12:11 PM      Failed - HDL in normal range and within 360 days    HDL  Date Value Ref Range Status  01/18/2021 36 (L) >39 mg/dL Final         Failed - Triglycerides in normal range and within 360 days    Triglycerides  Date Value Ref Range Status  01/18/2021 237 (H) 0 - 149 mg/dL Final   Triglycerides Piccolo,Waived  Date Value Ref Range Status  12/02/2018 145 <150 mg/dL Final    Comment:                            Normal                   <150                         Borderline High     150 - 199                         High                200 - 499                         Very High                >499          Passed - Total Cholesterol in normal range and within 360 days    Cholesterol, Total  Date Value Ref Range Status  01/18/2021 147 100 - 199 mg/dL Final   Cholesterol Piccolo, Waived  Date Value Ref Range Status  12/02/2018 136 <200 mg/dL Final    Comment:                            Desirable                <200                         Borderline High      200- 239                         High                     >239          Passed - LDL in normal range and within 360 days    LDL Chol Calc (NIH)  Date Value Ref Range Status  01/18/2021 72 0 - 99 mg/dL Final         Passed - ALT in normal range and within 180 days    ALT  Date Value Ref Range Status  01/18/2021 27  0 - 44 IU/L Final         Passed - AST in normal range and within 180 days    AST  Date Value Ref Range Status  01/18/2021 24 0 - 40 IU/L Final         Passed - Cr in normal range and within 180 days    Creatinine, Ser  Date Value Ref Range Status  01/18/2021 1.25 0.76 - 1.27 mg/dL Final   Creatinine, POC  Date Value Ref Range Status  08/22/2015 n/a mg/dL Final         Passed - eGFR in normal range and within 180 days    GFR calc Af Amer  Date Value Ref Range Status  02/24/2020 76 >59  mL/min/1.73 Final    Comment:    **In accordance with recommendations from the NKF-ASN Task force,**   [facility] is in the process of updating its eGFR calculation to the   2021 CKD-EPI creatinine equation that estimates kidney function   without a race variable.    GFR calc non Af Amer  Date Value Ref Range Status  02/24/2020 66 >59 mL/min/1.73 Final   eGFR  Date Value Ref Range Status  01/18/2021 67 >59 mL/min/1.73 Final         Passed - Valid encounter within last 12 months    Recent Outpatient Visits          2 months ago Uncontrolled type 2 diabetes mellitus with hyperglycemia ([place])   [place], Jolene T, NP   6 months ago Type 2 diabetes mellitus with proteinuria ([place])   [place], Jolene T, NP   10 months ago Uncontrolled type 2 diabetes mellitus with hyperglycemia ([place])   [place]ady, Jolene T, NP   10 months ago Syncope, unspecified syncope type   [place] McElwee, Lauren A, NP   1 year ago Uncontrolled type 2 diabetes mellitus with hyperglycemia ([place])   [place], [place] T, NP      Future Appointments            In 4 weeks Cannady, Barbaraann Faster, NP [facility], [facility]            lisinopril (ZESTRIL) 20 MG tablet [Pharmacy Med Name: LISINOPRIL 20 MG TAB] 90 tablet 0    Sig: TAKE 1 TABLET BY MOUTH ONCE DAILY     Cardiovascular:  ACE Inhibitors Passed - 03/28/2021 12:11 PM      Passed - Cr in normal range and within 180 days    Creatinine, Ser  Date Value Ref Range Status  01/18/2021 1.25 0.76 - 1.27 mg/dL Final   Creatinine, POC  Date Value Ref Range Status  08/22/2015 n/a mg/dL Final         Passed - K in normal range and within 180 days    Potassium  Date Value Ref Range Status  01/18/2021 4.6 3.5 - 5.2 mmol/L Final         Passed - Patient is not pregnant      Passed - Last BP in normal range    BP Readings from Last 1 Encounters:   01/18/21 112/65         Passed - Valid encounter within last 6 months    Recent Outpatient Visits          2 months ago Uncontrolled type 2 diabetes mellitus with hyperglycemia ([place])   [facility] [place], Barbaraann Faster, NP  6 months ago Type 2 diabetes mellitus with proteinuria ([place])   [place], Jolene T, NP   10 months ago Uncontrolled type 2 diabetes mellitus with hyperglycemia ([place])   [place], Jolene T, NP   10 months ago Syncope, unspecified syncope type   [place] McElwee, Lauren A, NP   1 year ago Uncontrolled type 2 diabetes mellitus with hyperglycemia ([place])   [place], Henrine Screws T, NP      Future Appointments            In 4 weeks Cannady, Barbaraann Faster, NP [facility], [facility]            atorvastatin (LIPITOR) 80 MG tablet [Pharmacy Med Name: ATORVASTATIN CALCIUM 80 MG TAB] 90 tablet 1    Sig: TAKE 1 TABLET BY MOUTH ONCE DAILY     Cardiovascular:  Antilipid - Statins Failed - 03/28/2021 12:11 PM      Failed - HDL in normal range and within 360 days    HDL  Date Value Ref Range Status  01/18/2021 36 (L) >39 mg/dL Final         Failed - Triglycerides in normal range and within 360 days    Triglycerides  Date Value Ref Range Status  01/18/2021 237 (H) 0 - 149 mg/dL Final   Triglycerides Piccolo,Waived  Date Value Ref Range Status  12/02/2018 145 <150 mg/dL Final    Comment:                            Normal                   <150                         Borderline High     150 - 199                         High                200 - 499                         Very High                >499          Passed - Total Cholesterol in normal range and within 360 days    Cholesterol, Total  Date Value Ref Range Status  01/18/2021 147 100 - 199 mg/dL Final   Cholesterol Piccolo, Waived  Date Value Ref Range Status  12/02/2018 136 <200 mg/dL Final    Comment:                             Desirable                <200                         Borderline High      200- 239                         High                     >239  Passed - LDL in normal range and within 360 days    LDL Chol Calc (NIH)  Date Value Ref Range Status  01/18/2021 72 0 - 99 mg/dL Final         Passed - Patient is not pregnant      Passed - Valid encounter within last 12 months    Recent Outpatient Visits          2 months ago Uncontrolled type 2 diabetes mellitus with hyperglycemia ([place])   [place], Jolene T, NP   6 months ago Type 2 diabetes mellitus with proteinuria ([place])   [place], Jolene T, NP   10 months ago Uncontrolled type 2 diabetes mellitus with hyperglycemia ([place])   [place], Jolene T, NP   10 months ago Syncope, unspecified syncope type   [place], NP   1 year ago Uncontrolled type 2 diabetes mellitus with hyperglycemia ([place])   [place], Barbaraann Faster, NP      Future Appointments            In 4 weeks Cannady, Barbaraann Faster, NP [facility], [facility]

## 2021-03-29 ENCOUNTER — Other Ambulatory Visit: Payer: Self-pay | Admitting: Nephrology

## 2021-03-29 DIAGNOSIS — I10 Essential (primary) hypertension: Secondary | ICD-10-CM

## 2021-03-29 DIAGNOSIS — R809 Proteinuria, unspecified: Secondary | ICD-10-CM

## 2021-03-29 DIAGNOSIS — E1129 Type 2 diabetes mellitus with other diabetic kidney complication: Secondary | ICD-10-CM

## 2021-03-29 DIAGNOSIS — N390 Urinary tract infection, site not specified: Secondary | ICD-10-CM

## 2021-03-29 DIAGNOSIS — R1084 Generalized abdominal pain: Secondary | ICD-10-CM

## 2021-04-05 ENCOUNTER — Telehealth: Payer: Self-pay

## 2021-04-05 NOTE — Chronic Care Management (AMB) (Signed)
Chronic Care Management Pharmacy Assistant   Name: [name].  MRN: [id number] DOB: [birth year]  Reason for Encounter: Disease State Diabetes Mellitus    Recent office visits:  03/20/20-[name], NP (PCP) General follow up visit. Labs ordered. Increase Tresiba to 70 units in 3 days then increase by 2 units. Flu vaccine given and Tetanus shot. Follow up in 3 months.  Recent consult visits:  03/29/21-Sarath Kolluru, MD (Nephrology) Follow up visit. Labs ordered. Follow up in 6 months. 01/23/21-Sarath Kolluru, MD (Nephrology) Follow up visit. Labs ordered. Follow up in 1 year. 12/19/20-[name], MD (Dermatology) Seen for post op.  12/12/20-[name], MD (Dermatology) Seen for a cyst. Follow up in 1 week. 11/22/20-Robert Woodard (Optometry) Notes not available.  Hospital visits:  None in previous 6 months  Medications: Outpatient Encounter Medications as of 04/05/2021  Medication Sig   aspirin 81 MG EC tablet Take 1 tablet (81 mg total) by mouth daily.   atorvastatin (LIPITOR) 80 MG tablet TAKE 1 TABLET BY MOUTH ONCE DAILY   Blood Glucose Monitoring Suppl (ONE TOUCH ULTRA 2) w/Device KIT Use to check blood sugar 4 times a day   BREO ELLIPTA 100-25 MCG/ACT AEPB INHALE 1 PUFF BY MOUTH ONCE DAILY   Cholecalciferol 1.25 MG (50000 UT) TABS Take 1 tablet by mouth once a week.   clobetasol cream (TEMOVATE) 4.74 % Apply 1 application topically 2 (two) times daily. Apply to affected area   clopidogrel (PLAVIX) 75 MG tablet Take 1 tablet by mouth daily.   EPINEPHRINE 0.3 mg/0.3 mL IJ SOAJ injection INJECT 1 SYRINGE INTO OUTER THIGH ONCE AS NEEDED FOR SEVERE ALLERGIC REACTION.   fenofibrate 54 MG tablet TAKE 1 TABLET BY MOUTH ONCE DAILY   gabapentin (NEURONTIN) 300 MG capsule TAKE 1 CAPSULE BY MOUTH TWICE DAILY   glucose blood (ONETOUCH ULTRA) test strip To check blood sugar three times daily   Insulin Pen Needle (PEN NEEDLES) 32G X 4 MM MISC 1 kit by Does not apply  route daily.   JARDIANCE 25 MG TABS tablet TAKE 1 TABLET BY MOUTH ONCE DAILY   Lancets (ONETOUCH DELICA PLUS [id number]) MISC USE AS DIRECTED.   lisinopril (ZESTRIL) 20 MG tablet TAKE 1 TABLET BY MOUTH ONCE DAILY   Misc. Devices (PULSE OXIMETER FOR FINGER) MISC To check O2 saturations once daily with asthma and document + check if any shortness of breath   montelukast (SINGULAIR) 10 MG tablet TAKE 1 TABLET BY MOUTH AT BEDTIME   nitroGLYCERIN (NITROSTAT) 0.4 MG SL tablet Place 1 tablet (0.4 mg total) under the tongue every 5 (five) minutes as needed for chest pain.   omeprazole (PRILOSEC) 20 MG capsule TAKE 1 CAPSULE BY MOUTH ONCE DAILY   tiZANidine (ZANAFLEX) 2 MG tablet TAKE 1 TABLET BY MOUTH EVERY 6 HOURS AS NEEDED FOR MUSCLE SPASMS   TRESIBA FLEXTOUCH 100 UNIT/ML FlexTouch Pen INJECT 52 UNITS SUBCUTANEOUSLY ONCE A DAY   trimethoprim-polymyxin b (POLYTRIM) ophthalmic solution Place 1 drop into the right eye every 6 (six) hours.    ULTRACARE PEN NEEDLES 32G X 5 MM MISC    No facility-administered encounter medications on file as of 04/05/2021.   Current antihyperglycemic regimen:  Tyler Aas inject 52 units daily Jardiance 25 mg once daily  What recent interventions/DTPs have been made to improve glycemic control:   03/20/20-(PCP) Increase Tresiba to 70 units in 3 days then increase by 2 units.  Have there been any recent hospitalizations or ED visits since last visit with  CPP? No  Patient denies hypoglycemic symptoms, including Pale, Sweaty, Shaky, Hungry, Nervous/irritable, and Vision changes  Patient denies hyperglycemic symptoms, including blurry vision, excessive thirst, fatigue, polyuria, and weakness  How often are you checking your blood sugar? 3-4 times daily  What are your blood sugars?  Fasting:04/12/21:238 Before meals:  After meals: 04/05/21:160 Bedtime:  During the week, how often does your blood glucose drop below 70? Never  Are you checking your feet daily/regularly?   Patient wife states he has been checking his feet daily. Patients wife states they do use diabetic cream for his feet.  Patients wife states they don't need to help with [place] nor his [place] for Patient assistance application. Patient wife states they have [place] at no cost & his Breo inhaler is about $2-$3.  Adherence Review: Is the patient currently on a STATIN medication? Yes Is the patient currently on ACE/ARB medication? Yes Does the patient have >5 day gap between last estimated fill dates? No   Care Gaps: Zoster Vaccines- Shingrix:Never done Pneumococcal Vaccine:Last completed: Nov 18, 2015 COVID-19 Vaccine:Last completed: Dec 14, 2019 FOOT EXAM:Last completed: Feb 24, 2020  Star Rating Drugs: Lisinopril 20 mg Last filled:03/28/21 90 DS Jardiance 25 mg Last filled:03/28/21 90 DS Atorvastatin 80 mg Last filled:03/28/21 90 DS  [name], [place]

## 2021-04-12 ENCOUNTER — Ambulatory Visit (INDEPENDENT_AMBULATORY_CARE_PROVIDER_SITE_OTHER): Payer: Medicare Other

## 2021-04-12 ENCOUNTER — Telehealth: Payer: Medicare Other

## 2021-04-12 DIAGNOSIS — E785 Hyperlipidemia, unspecified: Secondary | ICD-10-CM

## 2021-04-12 DIAGNOSIS — E1159 Type 2 diabetes mellitus with other circulatory complications: Secondary | ICD-10-CM

## 2021-04-12 DIAGNOSIS — Q159 Congenital malformation of eye, unspecified: Secondary | ICD-10-CM

## 2021-04-12 DIAGNOSIS — E1165 Type 2 diabetes mellitus with hyperglycemia: Secondary | ICD-10-CM

## 2021-04-12 NOTE — Patient Instructions (Signed)
Visit Information  Thank you for taking time to visit with me today. Please don't hesitate to contact me if I can be of assistance to you before our next scheduled telephone appointment.  Following are the goals we discussed today:  RNCM Clinical Goal(s):  Patient will verbalize understanding of plan for management of HTN, HLD, DMII, and right eye ulcer/sty  verbalize basic understanding of HTN, HLD, DMII, and right eye ulcer/sty disease process and self health management plan  take all medications exactly as prescribed and will call provider for medication related questions demonstrate understanding of rationale for each prescribed medication as evidence of compliance with medications and calling for refills before running out.  attend all scheduled medical appointments: 04-26-2021 at 2pm; also is still seeing the eye specialist weekly. demonstrate improved and ongoing adherence to prescribed treatment plan for HTN, HLD, DMII, and eye sty/ulcer as evidenced by daily monitoring and recording of CBG  adherence to ADA/ carb modified diet adherence to prescribed medication regimen contacting provider for new or worsened symptoms or questions  demonstrate improved and ongoing health management independence for chronic conditions as evidence of effective management and no acute exacerbations continue to work with Consulting civil engineer and/or Social Worker to address care management and care coordination needs related to HTN, HLD, DMII, and eye sty/ulcer  demonstrate a decrease in HTN, HLD, DMII, and eye styl/ulcer exacerbations  demonstrate ongoing self health care management ability  through collaboration with RN Care manager, provider, and care team.    Interventions: 1:1 collaboration with primary care provider regarding development and update of comprehensive plan of care as evidenced by provider attestation and co-signature Inter-disciplinary care team collaboration (see longitudinal plan of  care) Evaluation of current treatment plan related to  self management and patient's adherence to plan as established by provider     SDOH Barriers (Status: Goal on track: YES.)  Patient interviewed and SDOH assessment performed        SDOH Interventions     Flowsheet Row Most Recent Value  SDOH Interventions    Food Insecurity Interventions Intervention Not Indicated  Financial Strain Interventions Other (Comment)  [continued support from care guides and resources]  Housing Interventions Intervention Not Indicated  Intimate Partner Violence Interventions Intervention Not Indicated  Physical Activity Interventions Other (Comments)  [is active but no structured activity]  Stress Interventions Intervention Not Indicated  Social Connections Interventions Intervention Not Indicated, Other (Comment)  [good support system]  Transportation Interventions [employer]       Patient interviewed and appropriate assessments performed Provided patient with information about resources available. The patient and his wife have worked with care guides before and know the process for getting help in the community for expressed needs Advised patient to call the office for changes in [place], questions or concerns, the patient states they are getting food stamps now and are thankful for the food stamps Provided education to patient/caregiver regarding level of care options.       Diabetes:  (Status: Goal on track: YES.)      Lab Results  Component Value Date    HGBA1C 8.7 (H) 01/18/2021  Assessed patient's understanding of A1c goal: <7% Provided education to patient about basic DM disease process; Reviewed medications with patient and discussed importance of medication adherence. 04-12-2021: The patient states that he is taking his medications as ordered        Reviewed prescribed diet with patient heart healthy/ADA diet. 04-12-2021: The patient is doing better with dietary  restrictions  and denies any issues with compliance.  Counseled on importance of regular laboratory monitoring as prescribed. 04-12-2021: The patient has regular lab work         Discussed plans with patient for ongoing care management follow up and provided patient with direct contact information for care management team;      Provided patient with written educational materials related to hypo and hyperglycemia and importance of correct treatment. 04-12-2021: The patient denies any lows. States the highest he has had was a little greater than 200;       Reviewed scheduled/upcoming provider appointments including: 04-26-2021 at 2 pm;         Advised patient, providing education and rationale, to check cbg as directed and record. 01-03-2021: The patient checked his blood sugar while on the phone with the [facility] and it was 149. The patient states he has been in the 100's range and hardly ever over 200. Praised the patient for positive changes. Will have a new A1C coming up for follow up with the pcp on 01-18-2021. Denies any acute findings. States he is eating healthier and staying away from sodas currently.   02-22-2021: Alc trending down, In office in November the A1C was 8.7 down from 9.9.  The patients blood sugar this am was 165. The patient and patients wife encouraged to keep regular checks on blood sugars and review of goal of <130 fasting and <180 post prandial. 04-12-2021: Is checking more frequently. States he is doing better with checking his blood sugars. Review of goals.      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 04-12-2021: The patient states that he is seeing better and they have put sensors in his eyes and he can see better. He is following with specialist and will be likely having cataract surgery.  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 States that they were on back order. He can see some today out of his right eye, education and support given. 04-12-2021: The patient is seeing the specialist weekly and is taking medications as prescribed ; Reviewed scheduled/upcoming provider appointments including weekly with specialist with anticipation of cataract surgery in March of 2023; Discussed plans with patient for ongoing care management follow up and provided patient with direct contact information for care management team;   Hyperlipidemia:  (Status: Goal on Track (progressing): YES.)      Lab Results  Component Value Date  CHOL 147 01/18/2021    HDL 36 (L) 01/18/2021    LDLCALC 72 01/18/2021    TRIG 237 (H) 01/18/2021    CHOLHDL 5.3 03/03/2019      Medication review performed; medication list updated in electronic medical record.  Provider established cholesterol goals reviewed. 04-12-2021: Education and support given; Counseled on importance of regular laboratory monitoring as prescribed; Provided HLD educational materials; Reviewed role and benefits of statin for  ASCVD risk reduction; Discussed strategies to manage statin-induced myalgias; Reviewed importance of limiting foods high in cholesterol. 04-12-2021: Education provided on Heart Healthy/ADA diet  Reviewed exercise goals and target of 150 minutes per week;   Hypertension: (Status: Goal on track: YES.) Last practice recorded BP readings:     BP Readings from Last 3 Encounters:  01/18/21 112/65  09/21/20 106/64  08/10/20 126/62  Most recent eGFR/CrCl:       Lab Results  Component Value Date    EGFR 67 01/18/2021    No components found for: CRCL   Evaluation of current treatment plan related to hypertension self management and patient's adherence to plan as established by provider. 04-12-2021: The patient is doing well and has no question related to heart health. Will reschedule follow up with specialist to have an ECHO. Missed appointment due to having issues with his eye. Denies any acute findings;   Provided education to patient re: stroke prevention, s/s of heart attack and stroke; Reviewed prescribed diet heart healthy/ADA. 04-12-2021: Reviewed Reviewed medications with patient and discussed importance of compliance. 04-12-2021: States compliance with medications, wife assist with medications management   Counseled on adverse effects of illicit drug and excessive alcohol use in patients with high blood pressure;  Counseled on the importance of exercise goals with target of 150 minutes per week Discussed plans with patient for ongoing care management follow up and provided patient with direct contact information for care management team; Advised patient, providing education and rationale, to monitor blood pressure daily and record, calling PCP for findings outside established parameters;  Reviewed scheduled/upcoming provider appointments including: 04-26-2021 at 2 pm   Patient Goals/Self-Care Activities: Patient will self administer medications as prescribed as evidenced by self report/primary  caregiver report  Patient will attend all scheduled provider appointments as evidenced by clinician review of documented attendance to scheduled appointments and patient/caregiver report Patient will call pharmacy for medication refills as evidenced by patient report and review of pharmacy fill history as appropriate Patient will attend church or other social activities as evidenced by patient report Patient will continue to perform ADL's independently as evidenced by patient/caregiver report Patient will continue to perform IADL's independently as evidenced by patient/caregiver report Patient will call provider office for new concerns or questions as evidenced by review of documented incoming telephone call notes and patient report Patient will work with BSW to address care coordination needs and will continue to work with the clinical team to address health care and disease management related needs as evidenced by documented adherence to scheduled care management/care coordination appointments - keep appointment with eye doctor - check blood sugar at prescribed times: before meals and at bedtime, when you have symptoms of low or high blood sugar, and before and after exercise - check feet daily for cuts, sores or redness - enter blood sugar readings and medication or insulin into daily log - take the blood sugar log to all doctor visits - trim toenails straight across - drink 6 to 8 glasses of water each day - eat fish  at least once per week - fill half of plate with vegetables - limit fast food meals to no more than 1 per week - manage portion size - prepare main meal at home 3 to 5 days each week - read food labels for fat, fiber, carbohydrates and portion size - reduce red meat to 2 to 3 times a week - keep feet up while sitting - wash and dry feet carefully every day - wear comfortable, cotton socks - wear comfortable, well-fitting shoes - check blood pressure weekly - choose a place  to take my blood pressure (home, clinic or office, retail store) - write blood pressure results in a log or diary - learn about high blood pressure - keep a blood pressure log - take blood pressure log to all doctor appointments - call doctor for signs and symptoms of high blood pressure - keep all doctor appointments - take medications for blood pressure exactly as prescribed - report new symptoms to your doctor - eat more whole grains, fruits and vegetables, lean meats and healthy fats - call for medicine refill 2 or 3 days before it runs out - take all medications exactly as prescribed - call doctor with any symptoms you believe are related to your medicine - call doctor when you experience any new symptoms - go to all doctor appointments as scheduled - adhere to prescribed diet: heart healthy/ADA diet       Our next appointment is by telephone on 06-14-2021 at 1 pm  Please call the care guide team at [phone] if you need to cancel or reschedule your appointment.   If you are experiencing a Mental Health or [place] or need someone to talk to, please call the Suicide and Crisis Lifeline: 988 call the [place] National Suicide Prevention Lifeline: 513-491-2316 or TTY: (727) 687-9008 TTY 838-577-3395) to talk to a trained counselor call 1-800-273-TALK (toll free, 24 hour hotline)   The patient verbalized understanding of instructions, educational materials, and care plan provided today and declined offer to receive copy of patient instructions, educational materials, and care plan.   Noreene Larsson RN, MSN, [facility] Mobile: [phone]

## 2021-04-12 NOTE — Chronic Care Management (AMB) (Signed)
Chronic Care Management   CCM RN Visit Note  04/12/2021 Name: [name]. MRN: [id number] DOB: 1960/09/08  Subjective: [name]. is a 61 y.o. year old male who is a primary care patient of Cannady, Barbaraann Faster, NP. The care management team was consulted for assistance with disease management and care coordination needs.    Engaged with patient by telephone for follow up visit in response to provider referral for case management and/or care coordination services.   Consent to Services:  The patient was given information about Chronic Care Management services, agreed to services, and gave verbal consent prior to initiation of services.  Please see initial visit note for detailed documentation.   Patient agreed to services and verbal consent obtained.   Assessment: Review of patient past medical history, allergies, medications, health status, including review of consultants reports, laboratory and other test data, was performed as part of comprehensive evaluation and provision of chronic care management services.   SDOH (Social Determinants of Health) assessments and interventions performed:    CCM Care Plan  Allergies  Allergen Reactions   Bee Venom Hives    All kinds of bees   Other     Certain powders   Sulfa Antibiotics     Outpatient Encounter Medications as of 04/12/2021  Medication Sig   aspirin 81 MG EC tablet Take 1 tablet (81 mg total) by mouth daily.   atorvastatin (LIPITOR) 80 MG tablet TAKE 1 TABLET BY MOUTH ONCE DAILY   Blood Glucose Monitoring Suppl (ONE TOUCH ULTRA 2) w/Device KIT Use to check blood sugar 4 times a day   BREO ELLIPTA 100-25 MCG/ACT AEPB INHALE 1 PUFF BY MOUTH ONCE DAILY   Cholecalciferol 1.25 MG (50000 UT) TABS Take 1 tablet by mouth once a week.   clobetasol cream (TEMOVATE) 8.52 % Apply 1 application topically 2 (two) times daily. Apply to affected area   clopidogrel (PLAVIX) 75 MG tablet Take 1 tablet by mouth daily.    EPINEPHRINE 0.3 mg/0.3 mL IJ SOAJ injection INJECT 1 SYRINGE INTO OUTER THIGH ONCE AS NEEDED FOR SEVERE ALLERGIC REACTION.   fenofibrate 54 MG tablet TAKE 1 TABLET BY MOUTH ONCE DAILY   gabapentin (NEURONTIN) 300 MG capsule TAKE 1 CAPSULE BY MOUTH TWICE DAILY   glucose blood (ONETOUCH ULTRA) test strip To check blood sugar three times daily   Insulin Pen Needle (PEN NEEDLES) 32G X 4 MM MISC 1 kit by Does not apply route daily.   JARDIANCE 25 MG TABS tablet TAKE 1 TABLET BY MOUTH ONCE DAILY   Lancets (ONETOUCH DELICA PLUS [id number]) MISC USE AS DIRECTED.   lisinopril (ZESTRIL) 20 MG tablet TAKE 1 TABLET BY MOUTH ONCE DAILY   Misc. Devices (PULSE OXIMETER FOR FINGER) MISC To check O2 saturations once daily with asthma and document + check if any shortness of breath   montelukast (SINGULAIR) 10 MG tablet TAKE 1 TABLET BY MOUTH AT BEDTIME   nitroGLYCERIN (NITROSTAT) 0.4 MG SL tablet Place 1 tablet (0.4 mg total) under the tongue every 5 (five) minutes as needed for chest pain.   omeprazole (PRILOSEC) 20 MG capsule TAKE 1 CAPSULE BY MOUTH ONCE DAILY   tiZANidine (ZANAFLEX) 2 MG tablet TAKE 1 TABLET BY MOUTH EVERY 6 HOURS AS NEEDED FOR MUSCLE SPASMS   TRESIBA FLEXTOUCH 100 UNIT/ML FlexTouch Pen INJECT 52 UNITS SUBCUTANEOUSLY ONCE A DAY   trimethoprim-polymyxin b (POLYTRIM) ophthalmic solution Place 1 drop into the right eye every 6 (six) hours.  ULTRACARE PEN NEEDLES 32G X 5 MM MISC    No facility-administered encounter medications on file as of 04/12/2021.    Patient Active Problem List   Diagnosis Date Noted   Heart murmur 09/21/2020   Carotid artery bruit 09/21/2020   Type 2 diabetes mellitus with proteinuria ([place]) 03/25/2019   Atherosclerosis of both carotid arteries 03/22/2019   History of CVA (cerebrovascular accident) 03/06/2019   Uncontrolled type 2 diabetes mellitus with hyperglycemia ([place])    Obesity 09/03/2018   Frequent falls 05/15/2016   Vitamin D deficiency 08/22/2015   Carpal  tunnel syndrome on left 05/12/2015   External hemorrhoids 12/20/2014   Allergic rhinitis 10/21/2014   Diverticulosis 10/21/2014   Hypertension associated with diabetes ([place]) 10/21/2014   Hyperlipidemia associated with type 2 diabetes mellitus ([place]) 10/21/2014   Asthma 10/21/2014   GERD (gastroesophageal reflux disease) 10/21/2014   NAFLD (nonalcoholic fatty liver disease) 10/21/2014   Arthritis 10/21/2014   History of pancreatitis 10/07/2014   Phenylketonuria (PKU) ([place]) 10/07/2014   Renal cyst, left 10/07/2014   Gall bladder polyp 10/01/2013    Conditions to be addressed/monitored:HTN, HLD, DMII, and Eye issues  Care Plan : RNCM; General Plan of Care (Adult) for Chronic Disease Management and Care Coordination Needs  Updates made by Vanita Ingles, RN since 04/12/2021 12:00 AM     Problem: RNCM: Development of Care Plan for Chronic Disease Management and Care Coordination Needs (DM, HTN, HLD, Eye sty/ulcer/cataract   Priority: High     Long-Range Goal: RNCM: Development of Care Plan for Chronic Disease Management and Care Coordination Needs (DM, HTN, HLD, Eye sty/ulcer/cataract   Start Date: 01/03/2021  Expected End Date: 01/03/2022  Priority: High  Note:   Current Barriers:  Knowledge Deficits related to plan of care for management of HTN, HLD, DMII, and eye sty/ulcer   Care Coordination needs related to Financial constraints related to limited income, Limited social support, Transportation, and Level of care concerns  Chronic Disease Management support and education needs related to HTN, HLD, DMII, and eye sty/ulcer- right Lacks 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  attend all scheduled medical appointments: 04-26-2021 at 2pm; also is still seeing the eye specialist weekly. demonstrate improved and ongoing adherence to prescribed treatment plan for HTN, HLD, DMII, and eye sty/ulcer as evidenced by daily monitoring and recording of CBG  adherence to ADA/ carb modified diet adherence to prescribed medication regimen contacting provider for new or worsened symptoms or questions  demonstrate improved and ongoing health management independence for chronic conditions as evidence of effective management and no acute exacerbations continue to work with Consulting civil engineer and/or Social Worker to address care management and care coordination needs related to HTN, HLD, DMII, and eye sty/ulcer  demonstrate a decrease in HTN, HLD, DMII, and eye styl/ulcer exacerbations  demonstrate ongoing self health care management ability  through collaboration with RN Care manager, provider, and care team.   Interventions: 1:1 collaboration with primary care provider regarding development and update of comprehensive plan of care as evidenced by provider  attestation and co-signature Inter-disciplinary care team collaboration (see longitudinal plan of care) Evaluation of current treatment plan related to  self management and patient's adherence to plan as established by provider   SDOH Barriers (Status: Goal on track: YES.)  Patient interviewed and SDOH assessment performed        SDOH Interventions    Flowsheet Row Most Recent Value  SDOH Interventions   Food Insecurity Interventions Intervention Not Indicated  Financial Strain Interventions Other (Comment)  [continued support from care guides and resources]  Housing Interventions Intervention Not Indicated   Intimate Partner Violence Interventions Intervention Not Indicated  Physical Activity Interventions Other (Comments)  [is active but no structured activity]  Stress Interventions Intervention Not Indicated  Social Connections Interventions Intervention Not Indicated, Other (Comment)  [good support system]  Transportation Interventions [employer]     Patient interviewed and appropriate assessments performed Provided patient with information about resources available. The patient and his wife have worked with care guides before and know the process for getting help in the community for expressed needs Advised patient to call the office for changes in [place], questions or concerns, the patient states they are getting food stamps now and are thankful for the food stamps Provided education to patient/caregiver regarding level of care options.    Diabetes:  (Status: Goal on track: YES.) Lab Results  Component Value Date   HGBA1C 8.7 (H) 01/18/2021  Assessed patient's understanding of A1c goal: <7% Provided education to patient about basic DM disease process; Reviewed medications with patient and discussed importance of medication adherence. 04-12-2021: The patient states that he is taking his medications as ordered        Reviewed prescribed diet with patient heart healthy/ADA diet. 04-12-2021: The patient is doing better with dietary restrictions and denies any issues with compliance.  Counseled on importance of regular laboratory monitoring as prescribed. 04-12-2021: The patient has regular lab work         Discussed plans with patient for ongoing care management follow up and provided patient with direct contact information for care management team;      Provided patient with written educational materials related to hypo and hyperglycemia and importance of correct treatment. 04-12-2021: The patient denies any lows. States the highest he has had was a little greater than 200;        Reviewed scheduled/upcoming provider appointments including: 04-26-2021 at 2 pm;         Advised patient, providing education and rationale, to check cbg as directed and record. 01-03-2021: The patient checked his blood sugar while on the phone with the [facility] and it was 149. The patient states he has been in the 100's range and hardly ever over 200. Praised the patient for positive changes. Will have a new A1C coming up for follow up with the pcp on 01-18-2021. Denies any acute findings. States he is eating healthier and staying away from sodas currently.   02-22-2021: Alc trending down, In office in November the A1C was 8.7 down from 9.9.  The patients blood sugar this am was 165. The patient and patients wife encouraged to keep regular checks on blood sugars and review of goal of <130 fasting and <180 post prandial. 04-12-2021: Is checking more frequently. States he is doing better with checking his blood sugars. Review of goals.      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 04-12-2021: The patient states that he is seeing better and they have put sensors in his eyes and he can see better. He is following with specialist and will be likely having cataract surgery.  Discussed plans with patient for ongoing care management follow up and provided patient with direct contact information for care management team Advised patient to call the specialist for changes in his vision, pain,  or concerns related to right eye with clogged sty and ulcer noted; Provided education to patient re: keeping appointments, monitoring for changes in vision or other complications; Reviewed medications with patient and discussed 01-03-2021: The patient states that he just got his abx drops today for his eye and he is using them. States that they were on back order. He can see some today out of his right eye, education and support given. 04-12-2021: The patient is seeing the specialist weekly and is taking medications as prescribed ; Reviewed scheduled/upcoming provider appointments including weekly with specialist with anticipation of cataract surgery in March of 2023; Discussed plans with patient for ongoing care management follow up and provided patient with direct contact information for care management team;  Hyperlipidemia:  (Status: Goal on Track (progressing): YES.) Lab Results  Component Value Date   CHOL 147 01/18/2021   HDL 36 (L) 01/18/2021   LDLCALC 72 01/18/2021   TRIG 237 (H) 01/18/2021   CHOLHDL 5.3 03/03/2019     Medication review performed; medication list updated in electronic medical record.  Provider established cholesterol goals reviewed. 04-12-2021: Education and support given; Counseled on importance of regular laboratory monitoring as prescribed; Provided HLD educational materials; Reviewed role and benefits of statin for ASCVD risk reduction; Discussed strategies to manage statin-induced myalgias; Reviewed importance of limiting foods high in cholesterol. 04-12-2021: Education provided on Heart Healthy/ADA diet  Reviewed exercise goals and target of 150 minutes per week;  Hypertension: (Status: Goal on track: YES.) Last practice recorded BP readings:  BP Readings from Last 3 Encounters:  01/18/21 112/65  09/21/20 106/64  08/10/20 126/62  Most recent eGFR/CrCl:  Lab Results  Component Value Date   EGFR 67 01/18/2021    No components found for: CRCL  Evaluation  of current treatment plan related to hypertension self management and patient's adherence to plan as established by provider. 04-12-2021: The patient is doing well and has no question related to heart health. Will reschedule follow up with specialist to have an ECHO. Missed appointment due to having issues with his eye. Denies any acute findings;   Provided education to patient re: stroke prevention, s/s of heart attack and stroke; Reviewed prescribed diet heart healthy/ADA. 04-12-2021: Reviewed Reviewed medications with patient and discussed importance of compliance. 04-12-2021: States compliance with medications, wife assist with medications management   Counseled on adverse effects of illicit drug and excessive alcohol use in patients with high blood pressure;  Counseled on the importance of exercise goals with target of 150 minutes per week Discussed plans with patient for ongoing care management follow up and  provided patient with direct contact information for care management team; Advised patient, providing education and rationale, to monitor blood pressure daily and record, calling PCP for findings outside established parameters;  Reviewed scheduled/upcoming provider appointments including: 04-26-2021 at 2 pm  Patient Goals/Self-Care Activities: Patient will self administer medications as prescribed as evidenced by self report/primary caregiver report  Patient will attend all scheduled provider appointments as evidenced by clinician review of documented attendance to scheduled appointments and patient/caregiver report Patient will call pharmacy for medication refills as evidenced by patient report and review of pharmacy fill history as appropriate Patient will attend church or other social activities as evidenced by patient report Patient will continue to perform ADL's independently as evidenced by patient/caregiver report Patient will continue to perform IADL's independently as evidenced by  patient/caregiver report Patient will call provider office for new concerns or questions as evidenced by review of documented incoming telephone call notes and patient report Patient will work with BSW to address care coordination needs and will continue to work with the clinical team to address health care and disease management related needs as evidenced by documented adherence to scheduled care management/care coordination appointments - keep appointment with eye doctor - check blood sugar at prescribed times: before meals and at bedtime, when you have symptoms of low or high blood sugar, and before and after exercise - check feet daily for cuts, sores or redness - enter blood sugar readings and medication or insulin into daily log - take the blood sugar log to all doctor visits - trim toenails straight across - drink 6 to 8 glasses of water each day - eat fish at least once per week - fill half of plate with vegetables - limit fast food meals to no more than 1 per week - manage portion size - prepare main meal at home 3 to 5 days each week - read food labels for fat, fiber, carbohydrates and portion size - reduce red meat to 2 to 3 times a week - keep feet up while sitting - wash and dry feet carefully every day - wear comfortable, cotton socks - wear comfortable, well-fitting shoes - check blood pressure weekly - choose a place to take my blood pressure (home, clinic or office, retail store) - write blood pressure results in a log or diary - learn about high blood pressure - keep a blood pressure log - take blood pressure log to all doctor appointments - call doctor for signs and symptoms of high blood pressure - keep all doctor appointments - take medications for blood pressure exactly as prescribed - report new symptoms to your doctor - eat more whole grains, fruits and vegetables, lean meats and healthy fats - call for medicine refill 2 or 3 days before it runs out - take all  medications exactly as prescribed - call doctor with any symptoms you believe are related to your medicine - call doctor when you experience any new symptoms - go to all doctor appointments as scheduled - adhere to prescribed diet: heart healthy/ADA diet        Plan:Telephone follow up appointment with care management team member scheduled for:  06-14-2021 at 1 pm  [place], MSN, [facility] Mobile: [phone]

## 2021-04-14 ENCOUNTER — Ambulatory Visit
Admission: RE | Admit: 2021-04-14 | Discharge: 2021-04-14 | Disposition: A | Discharge status: Home / Self Care | Payer: Medicare Other | Source: Ambulatory Visit | Attending: Nephrology | Admitting: Nephrology

## 2021-04-14 ENCOUNTER — Other Ambulatory Visit: Payer: Self-pay

## 2021-04-14 DIAGNOSIS — R809 Proteinuria, unspecified: Secondary | ICD-10-CM | POA: Diagnosis present

## 2021-04-14 DIAGNOSIS — E1129 Type 2 diabetes mellitus with other diabetic kidney complication: Secondary | ICD-10-CM | POA: Diagnosis present

## 2021-04-14 DIAGNOSIS — R1084 Generalized abdominal pain: Secondary | ICD-10-CM | POA: Diagnosis present

## 2021-04-14 DIAGNOSIS — I10 Essential (primary) hypertension: Secondary | ICD-10-CM | POA: Insufficient documentation

## 2021-04-14 DIAGNOSIS — N390 Urinary tract infection, site not specified: Secondary | ICD-10-CM | POA: Insufficient documentation

## 2021-04-17 ENCOUNTER — Ambulatory Visit: Payer: Medicare Other

## 2021-04-17 DIAGNOSIS — I152 Hypertension secondary to endocrine disorders: Secondary | ICD-10-CM

## 2021-04-17 DIAGNOSIS — E1165 Type 2 diabetes mellitus with hyperglycemia: Secondary | ICD-10-CM

## 2021-04-17 NOTE — Patient Instructions (Signed)
Mr. Biel,  Thank you for talking with me today. I have included our care plan/goals in the following pages.   Please review and call me at [phone] with any questions.  Thanks! Ellin Mayhew, PharmD Clinical Pharmacist  [phone]  Care Plan : [place]  Updates made by Madelin Rear, [facility] since 04/17/2021 12:00 AM     Problem: Diabetes, HTN,HLD, Asthma, GERD, NAFLD,   Priority: High     Long-Range Goal: Disease Management   Recent Progress: Not on track  Priority: High  Note:   Hypertension (BP goal <130/80) -Controlled -Current treatment: Lisinopril 20 mg once daily -Medications previously tried: n/a  -Current home readings: n/a -Current dietary habits: lower salt now. -Current exercise habits: no formal exercise, will do house activities.  -Denies hypotensive/hypertensive symptoms -Educated on BP goals and benefits of medications for prevention of heart attack, stroke and kidney damage; -Counseled to monitor BP at home as directed, document, and provide log at future appointments -Recommended to continue current medication -Consider lisinopril 40 mg as tolerated  Diabetes (A1c goal <8%) -Uncontrolled -Current medications: Tresiba  Jardiance 25 mg once daily -Medications previously tried: stopped metformin after GFR decrease, glipizide (sulfa allergy) -Current home glucose readings Better diet, less soda. Today 165 fasting. Yesterday AM 185, lunch 155, supper 200s. Receives help on copays.  -Denies hypoglycemic/hyperglycemic symptoms -Educated on A1c and blood sugar goals; Prevention and management of hypoglycemic episodes; Benefits of routine self-monitoring of blood sugar; -Counseled to check feet daily and get yearly eye exams -Recommended to continue current medication Medication Assistance: Receives extra help on copays. Could consider Trulicity start at 2/8 f/u    Patient's preferred pharmacy is:  [place],  [address] [address] Phone: [phone] Fax: [phone]  Follow Up:  Patient agrees to Care Plan and Follow-up.  Plan: 3 month telephone f/u     The patient verbalized understanding of instructions provided today and agreed to receive a mailed copy of patient instruction and/or educational materials. Telephone follow up appointment with pharmacy team member scheduled for: See next appointment with "Care Management Staff" under "What's Next" below.

## 2021-04-17 NOTE — Progress Notes (Signed)
Chronic Care Management Pharmacy Note  04/17/2021 Name:  [name]. MRN:  [id number] DOB:  [birth year]  Summary: -DM - better diet, less soda. Today 165 fasting. Yesterday AM 185, lunch 155, supper 200s. Receives help on copays. Could consider Trulicty start 0/0/93.  Plan:  Subjective: [name]. is an 61 y.o. year old male who is a primary patient of Cannady, Barbaraann Faster, NP.  The CCM team was consulted for assistance with disease management and care coordination needs.    Engaged with patient by telephone for follow up visit in response to provider referral for pharmacy case management and/or care coordination services.   Consent to Services:  The patient was given information about Chronic Care Management services, agreed to services, and gave verbal consent prior to initiation of services.  Please see initial visit note for detailed documentation.   Patient Care Team: [name], NP as PCP - General (Nurse Practitioner) Anell Barr, [place] (Optometry) Dionisio David, MD as Consulting Physician (Cardiology) Vanita Ingles, RN as Case Manager (General Practice) Vladimir Faster, [facility] (Inactive) as Pharmacist (Pharmacist) Rebekah Chesterfield, LCSW as Social Worker (Licensed Clinical Social Worker)  Objective:  Lab Results  Component Value Date   CREATININE 1.25 01/18/2021   CREATININE 1.03 06/01/2020   CREATININE 1.26 05/23/2020    Lab Results  Component Value Date   HGBA1C 8.7 (H) 01/18/2021   Last diabetic Eye exam:  Lab Results  Component Value Date/Time   HMDIABEYEEXA No Retinopathy 07/01/2017 12:00 AM    Last diabetic Foot exam: No results found for: HMDIABFOOTEX      Component Value Date/Time   CHOL 147 01/18/2021 1431   CHOL 136 12/02/2018 1408   TRIG 237 (H) 01/18/2021 1431   TRIG 145 12/02/2018 1408   HDL 36 (L) 01/18/2021 1431   CHOLHDL 5.3 03/03/2019 0447   VLDL 44 (H) 03/03/2019 0447   VLDL 229 (H) 12/02/2018 1408    [place] 72 01/18/2021 1431    Hepatic Function Latest Ref Rng & Units 01/18/2021 06/01/2020 05/23/2020  Total Protein 6.0 - 8.5 g/dL 6.6 6.8 7.1  Albumin 3.8 - 4.9 g/dL 4.4 4.5 4.6  AST 0 - 40 IU/L _0 ALT 0 - 44 IU/L _1 Alk Phosphatase 44 - 121 IU/L 144(H) 187(H) 147(H)  Total Bilirubin 0.0 - 1.2 mg/dL 0.3 0.3 0.5  Bilirubin, Direct 0.1 - 0.5 mg/dL - - -    Lab Results  Component Value Date/Time   TSH 0.881 06/01/2020 03:16 PM   TSH 0.994 03/25/2019 10:25 AM    CBC Latest Ref Rng & Units 01/18/2021 05/23/2020 03/25/2019  WBC 3.4 - 10.8 x10E3/uL 5.0 5.6 6.0  Hemoglobin 13.0 - 17.7 g/dL 13.4 13.7 16.2  Hematocrit 37.5 - 51.0 % 42.6 42.6 48.5  Platelets 150 - 450 x10E3/uL 176 196 174    Lab Results  Component Value Date/Time   VD25OH 37.7 06/28/2020 09:16 AM   VD25OH 16.4 (L) 06/01/2020 03:16 PM    Clinical ASCVD:  The 10-year ASCVD risk score (Arnett DK, et al., 2019) is: 13.7%   Values used to calculate the score:     Age: 33 years     Sex: Male     Is Non-Hispanic African American: No     Diabetic: Yes     Tobacco smoker: No     Systolic Blood Pressure: 818 mmHg     Is BP treated: Yes  HDL Cholesterol: 36 mg/dL     Total Cholesterol: 147 mg/dL    Other: (CHADS2VASc if Afib, PHQ9 if depression, MMRC or CAT for COPD, ACT, DEXA)  Social History   Tobacco Use  Smoking Status Never  Smokeless Tobacco Never   BP Readings from Last 3 Encounters:  01/18/21 112/65  09/21/20 106/64  08/10/20 126/62   Pulse Readings from Last 3 Encounters:  01/18/21 60  09/21/20 66  08/10/20 (!) 56   Wt Readings from Last 3 Encounters:  01/18/21 179 lb 3.2 oz (81.3 kg)  09/21/20 174 lb 3.2 oz (79 kg)  08/10/20 177 lb (80.3 kg)    Assessment: Review of patient past medical history, allergies, medications, health status, including review of consultants reports, laboratory and other test data, was performed as part of comprehensive evaluation and provision of chronic  care management services.   SDOH:  (Social Determinants of Health) assessments and interventions performed: Yes   CCM Care Plan  Allergies  Allergen Reactions   Bee Venom Hives    All kinds of bees   Other     Certain powders   Sulfa Antibiotics     Medications Reviewed Today     Reviewed by Madelin Rear, [facility] (Pharmacist) on 04/17/21 at 1153  Med List Status: <None>   Medication Order Taking? Sig Documenting Provider Last Dose Status Informant  aspirin 81 MG EC tablet [id number] No Take 1 tablet (81 mg total) by mouth daily. Marnee Guarneri T, NP Taking Active   atorvastatin (LIPITOR) 80 MG tablet [id number] No TAKE 1 TABLET BY MOUTH ONCE DAILY Cannady, Jolene T, NP Taking Active   Blood Glucose Monitoring Suppl (ONE TOUCH ULTRA 2) w/Device KIT [id number] No Use to check blood sugar 4 times a day Cannady, Jolene T, NP Taking Active   BREO ELLIPTA 100-25 MCG/ACT AEPB [id number]  INHALE 1 PUFF BY MOUTH ONCE DAILY Cannady, Jolene T, NP  Active   Cholecalciferol 1.25 MG (50000 UT) TABS [id number]  Take 1 tablet by mouth once a week. Marnee Guarneri T, NP  Active   clobetasol cream (TEMOVATE) 0.05 % [id number] No Apply 1 application topically 2 (two) times daily. Apply to affected area Angiulli, Lavon Paganini, PA-C Taking Active   clopidogrel (PLAVIX) 75 MG tablet [id number] No Take 1 tablet by mouth daily. [provider] Taking Active   EPINEPHRINE 0.3 mg/0.3 mL IJ SOAJ injection [id number]  INJECT 1 SYRINGE INTO OUTER THIGH ONCE AS NEEDED FOR SEVERE ALLERGIC REACTION. Marnee Guarneri T, NP  Active   fenofibrate 54 MG tablet [id number]  TAKE 1 TABLET BY MOUTH ONCE DAILY Cannady, Jolene T, NP  Active   gabapentin (NEURONTIN) 300 MG capsule [id number]  TAKE 1 CAPSULE BY MOUTH TWICE DAILY Cannady, Jolene T, NP  Active   glucose blood (ONETOUCH ULTRA) test strip [id number] No To check blood sugar three times daily Cannady, Jolene T, NP Taking Active   Insulin Pen Needle (PEN NEEDLES) 32G X 4 MM  MISC [id number] No 1 kit by Does not apply route daily. Marnee Guarneri T, NP Taking Active   JARDIANCE 25 MG TABS tablet [id number]  TAKE 1 TABLET BY MOUTH ONCE DAILY Cannady, Barbaraann Faster, NP  Active   Lancets [facility] DELICA PLUS [id number]) MISC [id number]  USE AS DIRECTED. Marnee Guarneri T, NP  Active   lisinopril (ZESTRIL) 20 MG tablet [id number]  TAKE 1 TABLET BY MOUTH ONCE DAILY Cannady, Jolene T, NP  Active   Misc. Devices (PULSE OXIMETER FOR FINGER) MISC [id number] No  To check O2 saturations once daily with asthma and document + check if any shortness of breath Cannady, Jolene T, NP Taking Active   montelukast (SINGULAIR) 10 MG tablet [id number]  TAKE 1 TABLET BY MOUTH AT BEDTIME Cannady, Jolene T, NP  Active   nitroGLYCERIN (NITROSTAT) 0.4 MG SL tablet [id number] No Place 1 tablet (0.4 mg total) under the tongue every 5 (five) minutes as needed for chest pain. Birdie Sons, MD Taking Active   omeprazole (PRILOSEC) 20 MG capsule [id number]  TAKE 1 CAPSULE BY MOUTH ONCE DAILY Cannady, Jolene T, NP  Active   tiZANidine (ZANAFLEX) 2 MG tablet [id number] No TAKE 1 TABLET BY MOUTH EVERY 6 HOURS AS NEEDED FOR MUSCLE SPASMS Marnee Guarneri T, NP Taking Active   TRESIBA FLEXTOUCH 100 UNIT/ML FlexTouch Pen [id number]  INJECT 52 UNITS SUBCUTANEOUSLY ONCE A DAY Cannady, Jolene T, NP  Active   trimethoprim-polymyxin b (POLYTRIM) ophthalmic solution [id number] No Place 1 drop into the right eye every 6 (six) hours.  [provider] Taking Active   ULTRACARE PEN NEEDLES 32G X 5 MM MISC [id number] No  [provider] Taking Active             Patient Active Problem List   Diagnosis Date Noted   Heart murmur 09/21/2020   Carotid artery bruit 09/21/2020   Type 2 diabetes mellitus with proteinuria ([place]) 03/25/2019   Atherosclerosis of both carotid arteries 03/22/2019   History of CVA (cerebrovascular accident) 03/06/2019   Uncontrolled type 2 diabetes mellitus with hyperglycemia ([place])     Obesity 09/03/2018   Frequent falls 05/15/2016   Vitamin D deficiency 08/22/2015   Carpal tunnel syndrome on left 05/12/2015   External hemorrhoids 12/20/2014   Allergic rhinitis 10/21/2014   Diverticulosis 10/21/2014   Hypertension associated with diabetes ([place]) 10/21/2014   Hyperlipidemia associated with type 2 diabetes mellitus ([place]) 10/21/2014   Asthma 10/21/2014   GERD (gastroesophageal reflux disease) 10/21/2014   NAFLD (nonalcoholic fatty liver disease) 10/21/2014   Arthritis 10/21/2014   History of pancreatitis 10/07/2014   Phenylketonuria (PKU) ([place]) 10/07/2014   Renal cyst, left 10/07/2014   Gall bladder polyp 10/01/2013    Immunization History  Administered Date(s) Administered   Hepatitis A, Adult 09/26/2016, 05/22/2017   Hepatitis B, adult 09/26/2016, 05/22/2017, 11/29/2017   Influenza,inj,Quad PF,6+ Mos 12/22/2012, 12/21/2013, 11/29/2017, 12/02/2018, 01/18/2021   Influenza-Unspecified 11/30/2014, 11/18/2015   Moderna Sars-Covid-2 Vaccination 11/16/2019, 12/14/2019, 12/14/2019   Pneumococcal Polysaccharide-23 12/22/2012, 11/18/2015   Td 01/18/2021    Conditions to be addressed/monitored: CAD, HTN, HLD, DMII, Depression, and Asthma  Care Plan : [place]  Updates made by Madelin Rear, [facility] since 04/17/2021 12:00 AM     Problem: Diabetes, HTN,HLD, Asthma, GERD, NAFLD,   Priority: High     Long-Range Goal: Disease Management   Recent Progress: Not on track  Priority: High  Note:   Hypertension (BP goal <130/80) -Controlled -Current treatment: Lisinopril 20 mg once daily -Medications previously tried: n/a  -Current home readings: n/a -Current dietary habits: lower salt now. -Current exercise habits: no formal exercise, will do house activities.  -Denies hypotensive/hypertensive symptoms -Educated on BP goals and benefits of medications for prevention of heart attack, stroke and kidney damage; -Counseled to monitor BP at home as directed,  document, and provide log at future appointments -Recommended to continue current medication -Consider lisinopril 40 mg as tolerated  Diabetes (A1c goal <8%) -Uncontrolled -Current medications: Tresiba  Jardiance 25 mg once daily -Medications previously tried: stopped metformin  after GFR decrease, glipizide (sulfa allergy) -Current home glucose readings Better diet, less soda. Today 165 fasting. Yesterday AM 185, lunch 155, supper 200s. Receives help on copays.  -Denies hypoglycemic/hyperglycemic symptoms -Educated on A1c and blood sugar goals; Prevention and management of hypoglycemic episodes; Benefits of routine self-monitoring of blood sugar; -Counseled to check feet daily and get yearly eye exams -Recommended to continue current medication Medication Assistance: Receives extra help on copays. Could consider Trulicity start at 2/8 f/u    Patient's preferred pharmacy is:  [facility], [place] [address]. [address] [place] [address] Phone: [phone] Fax: [phone]  Follow Up:  Patient agrees to Care Plan and Follow-up.  Plan: 3 month telephone f/u      Future Appointments  Date Time Provider [place]  04/26/2021  2:00 PM Marnee Guarneri T, NP [facility]  05/08/2021 10:45 AM CFP CCM SOCIAL WORK [facility]  05/22/2021 10:00 AM CFP CCM PHARMACY [facility]  06/14/2021  1:00 PM CFP CCM CASE MANAGER [facility]   Madelin Rear, PharmD, BCGP Clinical Pharmacist  [facility]  [phone]

## 2021-04-18 DIAGNOSIS — E1159 Type 2 diabetes mellitus with other circulatory complications: Secondary | ICD-10-CM | POA: Diagnosis not present

## 2021-04-18 DIAGNOSIS — E785 Hyperlipidemia, unspecified: Secondary | ICD-10-CM | POA: Diagnosis not present

## 2021-04-18 DIAGNOSIS — E1169 Type 2 diabetes mellitus with other specified complication: Secondary | ICD-10-CM

## 2021-04-18 DIAGNOSIS — I152 Hypertension secondary to endocrine disorders: Secondary | ICD-10-CM

## 2021-04-18 DIAGNOSIS — E1165 Type 2 diabetes mellitus with hyperglycemia: Secondary | ICD-10-CM

## 2021-04-20 ENCOUNTER — Ambulatory Visit: Payer: Medicare Other | Admitting: Nurse Practitioner

## 2021-04-23 NOTE — Patient Instructions (Addendum)
Increase Tresiba to 40 units twice a day (morning and night) and start Humalog 5 units prior to every meal if blood sugar greater 130.  Diabetes Mellitus and Nutrition, Adult When you have diabetes, or diabetes mellitus, it is very important to have healthy eating habits because your blood sugar (glucose) levels are greatly affected by what you eat and drink. Eating healthy foods in the right amounts, at about the same times every day, can help you: Manage your blood glucose. Lower your risk of heart disease. Improve your blood pressure. Reach or maintain a healthy weight. What can affect my meal plan? Every person with diabetes is different, and each person has different needs for a meal plan. Your health care provider may recommend that you work with a dietitian to make a meal plan that is best for you. Your meal plan may vary depending on factors such as: The calories you need. The medicines you take. Your weight. Your blood glucose, blood pressure, and cholesterol levels. Your activity level. Other health conditions you have, such as heart or kidney disease. How do carbohydrates affect me? Carbohydrates, also called carbs, affect your blood glucose level more than any other type of food. Eating carbs raises the amount of glucose in your blood. It is important to know how many carbs you can safely have in each meal. This is different for every person. Your dietitian can help you calculate how many carbs you should have at each meal and for each snack. How does alcohol affect me? Alcohol can cause a decrease in blood glucose (hypoglycemia), especially if you use insulin or take certain diabetes medicines by mouth. Hypoglycemia can be a life-threatening condition. Symptoms of hypoglycemia, such as sleepiness, dizziness, and confusion, are similar to symptoms of having too much alcohol. Do not drink alcohol if: Your health care provider tells you not to drink. You are pregnant, may be  pregnant, or are planning to become pregnant. If you drink alcohol: Limit how much you have to: 0-1 drink a day for women. 0-2 drinks a day for men. Know how much alcohol is in your drink. In the [place] one drink equals one 12 oz bottle of beer (355 mL), one 5 oz glass of wine (148 mL), or one 1 oz glass of hard liquor (44 mL). Keep yourself hydrated with water, diet soda, or unsweetened iced tea. Keep in mind that regular soda, juice, and other mixers may contain a lot of sugar and must be counted as carbs. What are tips for following this plan? Reading food labels Start by checking the serving size on the Nutrition Facts label of packaged foods and drinks. The number of calories and the amount of carbs, fats, and other nutrients listed on the label are based on one serving of the item. Many items contain more than one serving per package. Check the total grams (g) of carbs in one serving. Check the number of grams of saturated fats and trans fats in one serving. Choose foods that have a low amount or none of these fats. Check the number of milligrams (mg) of salt (sodium) in one serving. Most people should limit total sodium intake to less than 2,300 mg per day. Always check the nutrition information of foods labeled as "low-fat" or "nonfat." These foods may be higher in added sugar or refined carbs and should be avoided. Talk to your dietitian to identify your daily goals for nutrients listed on the label. Shopping Avoid buying canned, pre-made, or processed foods.  These foods tend to be high in fat, sodium, and added sugar. Shop around the outside edge of the grocery store. This is where you will most often find fresh fruits and vegetables, bulk grains, fresh meats, and fresh dairy products. Cooking Use low-heat cooking methods, such as baking, instead of high-heat cooking methods, such as deep frying. Cook using healthy oils, such as olive, canola, or sunflower oil. Avoid cooking with  butter, cream, or high-fat meats. Meal planning Eat meals and snacks regularly, preferably at the same times every day. Avoid going long periods of time without eating. Eat foods that are high in fiber, such as fresh fruits, vegetables, beans, and whole grains. Eat 4-6 oz (112-168 g) of lean protein each day, such as lean meat, chicken, fish, eggs, or tofu. One ounce (oz) (28 g) of lean protein is equal to: 1 oz (28 g) of meat, chicken, or fish. 1 egg.  cup (62 g) of tofu. Eat some foods each day that contain healthy fats, such as avocado, nuts, seeds, and fish. What foods should I eat? Fruits Berries. Apples. Oranges. Peaches. Apricots. Plums. Grapes. Mangoes. Papayas. Pomegranates. Kiwi. Cherries. Vegetables Leafy greens, including lettuce, spinach, kale, chard, collard greens, mustard greens, and cabbage. Beets. Cauliflower. Broccoli. Carrots. Green beans. Tomatoes. Peppers. Onions. Cucumbers. Brussels sprouts. Grains Whole grains, such as whole-wheat or whole-grain bread, crackers, tortillas, cereal, and pasta. Unsweetened oatmeal. Quinoa. Brown or wild rice. Meats and other proteins Seafood. Poultry without skin. Lean cuts of poultry and beef. Tofu. Nuts. Seeds. Dairy Low-fat or fat-free dairy products such as milk, yogurt, and cheese. The items listed above may not be a complete list of foods and beverages you can eat and drink. Contact a dietitian for more information. What foods should I avoid? Fruits Fruits canned with syrup. Vegetables Canned vegetables. Frozen vegetables with butter or cream sauce. Grains Refined white flour and flour products such as bread, pasta, snack foods, and cereals. Avoid all processed foods. Meats and other proteins Fatty cuts of meat. Poultry with skin. Breaded or fried meats. Processed meat. Avoid saturated fats. Dairy Full-fat yogurt, cheese, or milk. Beverages Sweetened drinks, such as soda or iced tea. The items listed above may not be a  complete list of foods and beverages you should avoid. Contact a dietitian for more information. Questions to ask a health care provider Do I need to meet with a certified diabetes care and education specialist? Do I need to meet with a dietitian? What number can I call if I have questions? When are the best times to check my blood glucose? Where to find more information: American Diabetes Association: diabetes.org Academy of Nutrition and Dietetics: eatright.[employer] of Diabetes and Digestive and Kidney Diseases: [employer] Association of Diabetes Care & Education Specialists: diabeteseducator.org Summary It is important to have healthy eating habits because your blood sugar (glucose) levels are greatly affected by what you eat and drink. It is important to use alcohol carefully. A healthy meal plan will help you manage your blood glucose and lower your risk of heart disease. Your health care provider may recommend that you work with a dietitian to make a meal plan that is best for you. This information is not intended to replace advice given to you by your health care provider. Make sure you discuss any questions you have with your health care provider. Document Revised: 10/07/2019 Document Reviewed: 10/07/2019 Elsevier Patient Education  [place].

## 2021-04-26 ENCOUNTER — Encounter: Payer: Self-pay | Admitting: Nurse Practitioner

## 2021-04-26 ENCOUNTER — Ambulatory Visit (INDEPENDENT_AMBULATORY_CARE_PROVIDER_SITE_OTHER): Payer: Medicare Other | Admitting: Nurse Practitioner

## 2021-04-26 ENCOUNTER — Other Ambulatory Visit: Payer: Self-pay

## 2021-04-26 ENCOUNTER — Other Ambulatory Visit: Payer: Self-pay | Admitting: Nurse Practitioner

## 2021-04-26 VITALS — BP 110/60 | HR 60 | Temp 97.8°F | Ht 62.2 in | Wt 181.2 lb

## 2021-04-26 DIAGNOSIS — E1129 Type 2 diabetes mellitus with other diabetic kidney complication: Secondary | ICD-10-CM

## 2021-04-26 DIAGNOSIS — Z6832 Body mass index (BMI) 32.0-32.9, adult: Secondary | ICD-10-CM

## 2021-04-26 DIAGNOSIS — E785 Hyperlipidemia, unspecified: Secondary | ICD-10-CM

## 2021-04-26 DIAGNOSIS — E559 Vitamin D deficiency, unspecified: Secondary | ICD-10-CM

## 2021-04-26 DIAGNOSIS — E538 Deficiency of other specified B group vitamins: Secondary | ICD-10-CM

## 2021-04-26 DIAGNOSIS — J4540 Moderate persistent asthma, uncomplicated: Secondary | ICD-10-CM

## 2021-04-26 DIAGNOSIS — E6609 Other obesity due to excess calories: Secondary | ICD-10-CM

## 2021-04-26 DIAGNOSIS — E701 Other hyperphenylalaninemias: Secondary | ICD-10-CM

## 2021-04-26 DIAGNOSIS — E1165 Type 2 diabetes mellitus with hyperglycemia: Secondary | ICD-10-CM

## 2021-04-26 DIAGNOSIS — E1169 Type 2 diabetes mellitus with other specified complication: Secondary | ICD-10-CM

## 2021-04-26 DIAGNOSIS — E1159 Type 2 diabetes mellitus with other circulatory complications: Secondary | ICD-10-CM

## 2021-04-26 DIAGNOSIS — K760 Fatty (change of) liver, not elsewhere classified: Secondary | ICD-10-CM

## 2021-04-26 DIAGNOSIS — I6523 Occlusion and stenosis of bilateral carotid arteries: Secondary | ICD-10-CM

## 2021-04-26 DIAGNOSIS — I152 Hypertension secondary to endocrine disorders: Secondary | ICD-10-CM

## 2021-04-26 DIAGNOSIS — R809 Proteinuria, unspecified: Secondary | ICD-10-CM

## 2021-04-26 DIAGNOSIS — Z8673 Personal history of transient ischemic attack (TIA), and cerebral infarction without residual deficits: Secondary | ICD-10-CM

## 2021-04-26 LAB — MICROALBUMIN, URINE WAIVED
Creatinine, Urine Waived: 50 mg/dL (ref 10–300)
Microalb, Ur Waived: 10 mg/L (ref 0–19)
Microalb/Creat Ratio: <30 mg/g (ref <30)

## 2021-04-26 LAB — BAYER DCA HB A1C WAIVED: HB A1C (BAYER DCA - WAIVED): 10.2 % — ABNORMAL HIGH (ref 4.8–5.6)

## 2021-04-26 MED ORDER — TRESIBA FLEXTOUCH 100 UNIT/ML ~~LOC~~ SOPN: PEN_INJECTOR | SUBCUTANEOUS | 4 refills | Status: DC

## 2021-04-26 MED ORDER — INSULIN LISPRO (1 UNIT DIAL) 100 UNIT/ML (KWIKPEN): 5.0000 [IU] | PEN_INJECTOR | Freq: Three times a day (TID) | SUBCUTANEOUS | 4 refills | Status: DC

## 2021-04-26 NOTE — Progress Notes (Signed)
BP 110/60    Pulse 60    Temp 97.8 F (36.6 C) (Oral)    Ht 5' 2.2" (1.58 m)    Wt 181 lb 3.2 oz (82.2 kg)    SpO2 96%    BMI 32.93 kg/m    Subjective:    Patient ID: Alex Metro., male    DOB: 14-Aug-1960, 61 y.o.   MRN: [id number]  HPI: Alex Lopezmartinez. is a 61 y.o. male  Chief Complaint  Patient presents with   Diabetes   Hypertension   Hyperlipidemia   Wife is at bedside with him today.  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  Rent has gone up.  DIABETES Recent A1C in 8.7% in November.  He continues on Jardiance 25 MG along with Tresiba 72 units.  He was scheduled to see endocrinology for initial visit on 11/24/20, but missed this visit and has not rescheduled.  Taking Gabapentin 300 MG BID.  He has been drinking lots of sodas still.  His wife reports he eats a lot of chips and chocolate cookies -- hides these in the house.  8.7  His wife, [name], is in charge of medications at home.  In past Metformin caused kidney issues.  Has history of GI issues and pancreatitis -- has struggled with GLP1 in past with GI issues.    Saw Dr. Juleen China with nephrology on 03/29/21 for proteinuria -- CRT 1.17, eGFR 72.   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 Has seen dietician. He reports he is trying to drink zero sugar soda only lately. Hypoglycemic episodes:no Polydipsia/polyuria: yes Visual disturbance: no Chest pain: no Paresthesias: no Glucose Monitoring: yes             Accucheck frequency: TID -- did not bring book with him today             Fasting glucose: 100 to 200 -- bee in 100 range a lot             Post prandial:              Evening: 100-200 range             Before meals: Taking Insulin?: yes             Long acting  insulin: Tresiba 72 units             Short acting insulin: Blood Pressure Monitoring: not checking Retinal Examination: Up To Date == has cataracts Foot Exam: Up to Date Pneumovax: Up to Date Influenza: Up to Date Aspirin: yes   HYPERTENSION / HYPERLIPIDEMIA 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 Sees Dr. Humphrey Rolls for cardiology, has not seen in awhile -- never obtained echo due to cost. Had initial visit with Dr. Melrose Nakayama on 11/09/19 -- he is to continue Plavix and ASA per their note.  He reports having some weakness ongoing to legs bilaterally.    Current medications Lisinopril, Atorvastatin, Fenofibrate, NTG.   Satisfied with current treatment? yes Duration of hypertension: chronic BP monitoring frequency: daily BP range:  BP medication side effects: no Duration of hyperlipidemia: chronic Cholesterol medication side effects: no Cholesterol supplements: none Medication compliance: good compliance Aspirin: yes Recent stressors: no Recurrent headaches: no Visual  changes: no Palpitations: no Dyspnea: occasional Chest pain: no Lower extremity edema: no Dizzy/lightheaded: no  ASTHMA Started Breo on 09/02/19 -- he reports using this every day - and Albuterol as needed which he has not had to use in long while.  Never a smoker.  FEV1 70% in June 2021.  Takes Singulair daily.  Continues to have well-controlled GERD with Prilosec. COPD status: stable Satisfied with current treatment?: yes Oxygen use: no Dyspnea frequency: occasional per baseline -- he would like a  pulse ox at home Cough frequency:none Rescue inhaler frequency: once or twice monthly Limitation of activity: no Productive cough: none Last Spirometry: June 2021 Pneumovax: Up to Date Influenza: Up to Date   Relevant past medical, surgical, family and social history reviewed and updated as indicated. Interim medical history since our last visit reviewed. Allergies and medications reviewed and updated.  Review of Systems   Constitutional:  Negative for activity change, diaphoresis, fatigue and fever.  Respiratory:  Negative for cough, chest tightness, shortness of breath and wheezing.   Cardiovascular:  Negative for chest pain, palpitations and leg swelling.  Gastrointestinal: Negative.   Endocrine: Negative for cold intolerance, heat intolerance, polydipsia, polyphagia and polyuria.  Musculoskeletal:  Positive for arthralgias.  Neurological: Negative.   Psychiatric/Behavioral: Negative.     Per HPI unless specifically indicated above     Objective:    BP 110/60    Pulse 60    Temp 97.8 F (36.6 C) (Oral)    Ht 5' 2.2" (1.58 m)    Wt 181 lb 3.2 oz (82.2 kg)    SpO2 96%    BMI 32.93 kg/m   Wt Readings from Last 3 Encounters:  04/26/21 181 lb 3.2 oz (82.2 kg)  01/18/21 179 lb 3.2 oz (81.3 kg)  09/21/20 174 lb 3.2 oz (79 kg)    Physical Exam Vitals and nursing note reviewed.  Constitutional:      General: He is awake. He is not in acute distress.    Appearance: He is well-developed. He is obese. He is not ill-appearing.  HENT:     Head: Normocephalic and atraumatic.     Right Ear: Hearing normal. No drainage.     Left Ear: Hearing normal. No drainage.  Eyes:     General: Lids are normal.        Right eye: No discharge.        Left eye: No discharge.     Conjunctiva/sclera: Conjunctivae normal.     Pupils: Pupils are equal, round, and reactive to light.  Neck:     Thyroid: No thyromegaly.     Vascular: Carotid bruit (R>L) present.  Cardiovascular:     Rate and Rhythm: Normal rate and regular rhythm.     Heart sounds: S1 normal and S2 normal. Murmur heard.  Systolic murmur is present with a grade of 2/6.    No gallop.     Comments: Systolic murmur noted best at left sternal border, soft. Pulmonary:     Effort: Pulmonary effort is normal. No accessory muscle usage or respiratory distress.     Breath sounds: Normal breath sounds.  Abdominal:     General: Bowel sounds are normal.      Palpations: Abdomen is soft.  Musculoskeletal:        General: Normal range of motion.     Cervical back: Normal range of motion and neck supple.     Right lower leg: No edema.     Left lower leg: No  edema.  Skin:    General: Skin is warm and dry.     Capillary Refill: Capillary refill takes less than 2 seconds.  Neurological:     Mental Status: He is alert and oriented to person, place, and time.     Deep Tendon Reflexes: Reflexes are normal and symmetric.  Psychiatric:        Attention and Perception: Attention normal.        Mood and Affect: Mood normal.        Speech: Speech normal.        Behavior: Behavior normal. Behavior is cooperative.   Results for orders placed or performed in visit on 04/26/21  Bayer DCA Hb A1c Waived  Result Value Ref Range   HB A1C (BAYER DCA - WAIVED) 10.2 (H) 4.8 - 5.6 %  Microalbumin, Urine Waived  Result Value Ref Range   Microalb, Ur Waived 10 0 - 19 mg/L   Creatinine, Urine Waived 50 10 - 300 mg/dL   Microalb/Creat Ratio <30 <30 mg/g      Assessment & Plan:   Problem List Items Addressed This Visit       Cardiovascular and Mediastinum   Atherosclerosis of both carotid arteries    Ongoing, is followed by cardiology, no recent visits.  Continue collaboration with cardiology and current statin and ASA.      Hypertension associated with diabetes ([place])    Chronic, stable with BP at goal in office.  Continue current medication regimen and collaboration with cardiology, adjust regimen as needed.  Labs: CMP, TSH, and CBC.  Recommend they check his BP at home at least daily and document + focus on DASH diet.  Return to office in 3 months for BP follow-up.      Relevant Medications   insulin degludec (TRESIBA FLEXTOUCH) 100 UNIT/ML FlexTouch Pen   insulin lispro (HUMALOG KWIKPEN) 100 UNIT/ML KwikPen   Other Relevant Orders   Comprehensive metabolic panel   CBC with Differential/Platelet   TSH   Bayer DCA Hb A1c Waived (Completed)    Microalbumin, Urine Waived (Completed)     Respiratory   Asthma    Chronic, ongoing.  FEV1 70% at check in 2021.  Tolerating Breo, to continue to use daily.  Recommend using Albuterol as needed only if episodes of SOB present. Return in 3 months. Repeat spirometry next visit.        Digestive   NAFLD (nonalcoholic fatty liver disease)    Check LFT and GGT today.      Relevant Orders   Gamma GT   Comprehensive metabolic panel     Endocrine   Hyperlipidemia associated with type 2 diabetes mellitus (HCC)    Chronic, ongoing.  Continue Lipitor and Fenofibrate. Lipid panel today.  Adjust doses as needed, if LDL >70.      Relevant Medications   insulin degludec (TRESIBA FLEXTOUCH) 100 UNIT/ML FlexTouch Pen   insulin lispro (HUMALOG KWIKPEN) 100 UNIT/ML KwikPen   Other Relevant Orders   Comprehensive metabolic panel   Lipid Panel w/o Chol/HDL Ratio   Bayer DCA Hb A1c Waived (Completed)   Type 2 diabetes mellitus with proteinuria (HCC)    Chronic, ongoing.  A1C upward trend today at 10.2%, previous 8.7%.  Urine ALB 10 today. Has poor diet at home. Missed endo visit in September for initial check, new referral placed.  Continue Jardiance and increase Tresiba to 40 units BID + will start meal time Humalog 5 units before meals. - Poor tolerance to Metformin  in past and allergic to Sulfa.  Would avoid GLP at this time due to patient history of pancreatitis, concern this would flare.  Educated patient at length on effect of diabetes from head to toe and increased risk for recurrent CVA due to poor control.   - Recommend they check his BS TID -- he would benefit from [facility] will work on this with CCM team in future.  Goal A1C <6.5% due to CVA history -- which reiterated at length with patient today.  Continue collaboration with CCM team.  Return in 3 months.      Relevant Medications   insulin degludec (TRESIBA FLEXTOUCH) 100 UNIT/ML FlexTouch Pen   insulin lispro (HUMALOG KWIKPEN)  100 UNIT/ML KwikPen   Other Relevant Orders   Bayer DCA Hb A1c Waived (Completed)   Microalbumin, Urine Waived (Completed)   Ambulatory referral to Endocrinology   Uncontrolled type 2 diabetes mellitus with hyperglycemia ([place]) - Primary    Refer to Type 2 Diabetes with proteinuria plan.      Relevant Medications   insulin degludec (TRESIBA FLEXTOUCH) 100 UNIT/ML FlexTouch Pen   insulin lispro (HUMALOG KWIKPEN) 100 UNIT/ML KwikPen   Other Relevant Orders   Bayer DCA Hb A1c Waived (Completed)   Microalbumin, Urine Waived (Completed)   Ambulatory referral to Endocrinology     Other   B12 deficiency    History of low levels, check today and start supplement as needed.      Relevant Orders   CBC with Differential/Platelet   Vitamin B12   History of CVA (cerebrovascular accident)    Continue collaboration with neurology and continue statin + ASA/Plavix.  Discussed goals BP <130/90, A1C < 6.5%, and LDL <70.  He is poorly controlled with diabetes, much related to knowledge base and poor diet.  Referral to endocrinology placed and have HIGHLY recommended he attend.      Obesity    BMI 32.93.  Recommended eating smaller high protein, low fat meals more frequently and exercising 30 mins a day 5 times a week with a goal of 10-15lb weight loss in the next 3 months. Patient voiced their understanding and motivation to adhere to these recommendations.       Relevant Medications   insulin degludec (TRESIBA FLEXTOUCH) 100 UNIT/ML FlexTouch Pen   insulin lispro (HUMALOG KWIKPEN) 100 UNIT/ML KwikPen   Phenylketonuria (PKU) (HCC)    Mental delays present, continue to work with CCM team in assisting with education and reiterating diet needs.      Vitamin D deficiency    Ongoing, stable.  Continue supplement and recheck level today.      Relevant Orders   VITAMIN D 25 Hydroxy (Vit-D Deficiency, Fractures)    Time: 25 minutes, >50% spent counseling/or care coordination/education   Follow  up plan: Return in about 4 weeks (around 05/24/2021) for T2DM.

## 2021-04-26 NOTE — Assessment & Plan Note (Signed)
Chronic, ongoing.  A1C upward trend today at 10.2%, previous 8.7%.  Urine ALB 10 today. Has poor diet at home. Missed endo visit in September for initial check, new referral placed.  Continue Jardiance and increase Tresiba to 40 units BID + will start meal time Humalog 5 units before meals. - Poor tolerance to Metformin in past and allergic to Sulfa.  Would avoid GLP at this time due to patient history of pancreatitis, concern this would flare.  Educated patient at length on effect of diabetes from head to toe and increased risk for recurrent CVA due to poor control.   - Recommend they check his BS TID -- he would benefit from [facility] will work on this with CCM team in future.  Goal A1C <6.5% due to CVA history -- which reiterated at length with patient today.  Continue collaboration with CCM team.  Return in 3 months.

## 2021-04-26 NOTE — Assessment & Plan Note (Signed)
Chronic, ongoing.  FEV1 70% at check in 2021.  Tolerating Breo, to continue to use daily.  Recommend using Albuterol as needed only if episodes of SOB present. Return in 3 months. Repeat spirometry next visit.

## 2021-04-26 NOTE — Assessment & Plan Note (Signed)
Ongoing, is followed by cardiology, no recent visits.  Continue collaboration with cardiology and current statin and ASA.

## 2021-04-26 NOTE — Assessment & Plan Note (Signed)
Ongoing, stable.  Continue supplement and recheck level today.

## 2021-04-26 NOTE — Assessment & Plan Note (Signed)
Refer to Type 2 Diabetes with proteinuria plan.

## 2021-04-26 NOTE — Assessment & Plan Note (Signed)
BMI 32.93.  Recommended eating smaller high protein, low fat meals more frequently and exercising 30 mins a day 5 times a week with a goal of 10-15lb weight loss in the next 3 months. Patient voiced their understanding and motivation to adhere to these recommendations.

## 2021-04-26 NOTE — Assessment & Plan Note (Signed)
History of low levels, check today and start supplement as needed.

## 2021-04-26 NOTE — Assessment & Plan Note (Signed)
Chronic, stable with BP at goal in office.  Continue current medication regimen and collaboration with cardiology, adjust regimen as needed.  Labs: CMP, TSH, and CBC.  Recommend they check his BP at home at least daily and document + focus on DASH diet.  Return to office in 3 months for BP follow-up.

## 2021-04-26 NOTE — Assessment & Plan Note (Signed)
Continue collaboration with neurology and continue statin + ASA/Plavix.  Discussed goals BP <130/90, A1C < 6.5%, and LDL <70.  He is poorly controlled with diabetes, much related to knowledge base and poor diet.  Referral to endocrinology placed and have HIGHLY recommended he attend.

## 2021-04-26 NOTE — Assessment & Plan Note (Signed)
Chronic, ongoing.  Continue Lipitor and Fenofibrate. Lipid panel today.  Adjust doses as needed, if LDL >70.

## 2021-04-26 NOTE — Assessment & Plan Note (Signed)
Check LFT and GGT today.

## 2021-04-26 NOTE — Assessment & Plan Note (Signed)
Mental delays present, continue to work with CCM team in [place] with education and reiterating diet needs.

## 2021-04-27 ENCOUNTER — Other Ambulatory Visit: Payer: Self-pay | Admitting: Nurse Practitioner

## 2021-04-27 DIAGNOSIS — R062 Wheezing: Secondary | ICD-10-CM

## 2021-04-27 LAB — COMPREHENSIVE METABOLIC PANEL
ALT: 36 IU/L (ref 0–44)
AST: 24 IU/L (ref 0–40)
Albumin/Globulin Ratio: 2.1 (ref 1.2–2.2)
Albumin: 4.8 g/dL (ref 3.8–4.9)
Alkaline Phosphatase: 148 IU/L — ABNORMAL HIGH (ref 44–121)
BUN/Creatinine Ratio: 17 (ref 9–20)
BUN: 24 mg/dL (ref 6–24)
Bilirubin Total: 0.5 mg/dL (ref 0.0–1.2)
CO2: 19 mmol/L — ABNORMAL LOW (ref 20–29)
Calcium: 9.2 mg/dL (ref 8.7–10.2)
Chloride: 107 mmol/L — ABNORMAL HIGH (ref 96–106)
Creatinine, Ser: 1.44 mg/dL — ABNORMAL HIGH (ref 0.76–1.27)
Globulin, Total: 2.3 g/dL (ref 1.5–4.5)
Glucose: 145 mg/dL — ABNORMAL HIGH (ref 70–99)
Potassium: 4.2 mmol/L (ref 3.5–5.2)
Sodium: 141 mmol/L (ref 134–144)
Total Protein: 7.1 g/dL (ref 6.0–8.5)
eGFR: 56 mL/min/{1.73_m2} — ABNORMAL LOW (ref >59)

## 2021-04-27 LAB — CBC WITH DIFFERENTIAL/PLATELET
Basophils Absolute: 0 10*3/uL (ref 0.0–0.2)
Basos: 1 %
EOS (ABSOLUTE): 0.2 10*3/uL (ref 0.0–0.4)
Eos: 3 %
Hematocrit: 43.9 % (ref 37.5–51.0)
Hemoglobin: 14.6 g/dL (ref 13.0–17.7)
Immature Grans (Abs): 0 10*3/uL (ref 0.0–0.1)
Immature Granulocytes: 1 %
Lymphocytes Absolute: 1.4 10*3/uL (ref 0.7–3.1)
Lymphs: 28 %
MCH: 30.4 pg (ref 26.6–33.0)
MCHC: 33.3 g/dL (ref 31.5–35.7)
MCV: 92 fL (ref 79–97)
Monocytes Absolute: 0.5 10*3/uL (ref 0.1–0.9)
Monocytes: 10 %
Neutrophils Absolute: 2.7 10*3/uL (ref 1.4–7.0)
Neutrophils: 57 %
Platelets: 174 10*3/uL (ref 150–450)
RBC: 4.8 x10E6/uL (ref 4.14–5.80)
RDW: 14.2 % (ref 11.6–15.4)
WBC: 4.8 10*3/uL (ref 3.4–10.8)

## 2021-04-27 LAB — VITAMIN B12: Vitamin B-12: 251 pg/mL (ref 232–1245)

## 2021-04-27 LAB — LIPID PANEL W/O CHOL/HDL RATIO
Cholesterol, Total: 170 mg/dL (ref 100–199)
HDL: 37 mg/dL — ABNORMAL LOW (ref >39)
LDL Chol Calc (NIH): 92 mg/dL (ref 0–99)
Triglycerides: 240 mg/dL — ABNORMAL HIGH (ref 0–149)
VLDL Cholesterol Cal: 41 mg/dL — ABNORMAL HIGH (ref 5–40)

## 2021-04-27 LAB — TSH: TSH: 1.13 u[IU]/mL (ref 0.450–4.500)

## 2021-04-27 LAB — VITAMIN D 25 HYDROXY (VIT D DEFICIENCY, FRACTURES): Vit D, 25-Hydroxy: 22.5 ng/mL — ABNORMAL LOW (ref 30.0–100.0)

## 2021-04-27 LAB — GAMMA GT: GGT: 24 IU/L (ref 0–65)

## 2021-04-27 MED ORDER — CHOLECALCIFEROL 1.25 MG (50000 UT) PO TABS: 1.0000 | ORAL_TABLET | ORAL | 3 refills | Status: DC

## 2021-04-27 MED ORDER — VITAMIN B-12 1000 MCG PO TABS: 1000.0000 ug | ORAL_TABLET | Freq: Every day | ORAL | 4 refills | Status: DC

## 2021-04-27 MED ORDER — EMPAGLIFLOZIN 25 MG PO TABS: 25.0000 mg | ORAL_TABLET | Freq: Every day | ORAL | 0 refills | Status: DC

## 2021-04-27 NOTE — Telephone Encounter (Signed)
Not on current med list. Requested Prescriptions  Pending Prescriptions Disp Refills   diclofenac Sodium (VOLTAREN) 1 % GEL [Pharmacy Med Name: DICLOFENAC SODIUM 1% TOP GEL GM] 200 g     Sig: APPLY 2 GRAM TO AFFECTED AREA 3 TIMES DAILY     Analgesics:  Topicals Failed - 04/26/2021  5:33 PM      Failed - Manual Review: Labs are only required if the patient has taken medication for more than 8 weeks.      Passed - PLT in normal range and within 360 days    Platelets  Date Value Ref Range Status  01/18/2021 176 150 - 450 x10E3/uL Final         Passed - HGB in normal range and within 360 days    Hemoglobin  Date Value Ref Range Status  01/18/2021 13.4 13.0 - 17.7 g/dL Final         Passed - HCT in normal range and within 360 days    Hematocrit  Date Value Ref Range Status  01/18/2021 42.6 37.5 - 51.0 % Final         Passed - Cr in normal range and within 360 days    Creatinine, Ser  Date Value Ref Range Status  01/18/2021 1.25 0.76 - 1.27 mg/dL Final   Creatinine, POC  Date Value Ref Range Status  08/22/2015 n/a mg/dL Final         Passed - eGFR is 30 or above and within 360 days    GFR calc Af Amer  Date Value Ref Range Status  02/24/2020 76 >59 mL/min/1.73 Final    Comment:    **In accordance with recommendations from the NKF-ASN Task force,**   [facility] is in the process of updating its eGFR calculation to the   2021 CKD-EPI creatinine equation that estimates kidney function   without a race variable.    GFR calc non Af Amer  Date Value Ref Range Status  02/24/2020 66 >59 mL/min/1.73 Final   eGFR  Date Value Ref Range Status  01/18/2021 67 >59 mL/min/1.73 Final         Passed - Patient is not pregnant      Passed - Valid encounter within last 12 months    Recent Outpatient Visits          Yesterday Uncontrolled type 2 diabetes mellitus with hyperglycemia ([place])   [place], Jolene T, NP   3 months ago Uncontrolled type 2 diabetes  mellitus with hyperglycemia ([place])   [place], Jolene T, NP   7 months ago Type 2 diabetes mellitus with proteinuria ([place])   [place], Jolene T, NP   11 months ago Uncontrolled type 2 diabetes mellitus with hyperglycemia ([place])   [place] Cannady, Jolene T, NP   11 months ago Syncope, unspecified syncope type   [place], Lauren A, NP      Future Appointments            In 3 weeks Cannady, Barbaraann Faster, NP [facility], [facility]

## 2021-04-27 NOTE — Telephone Encounter (Signed)
Patient's wife called and advised Vitamin D was sent to [facility] on 01/18/21 #12/3 refills, so he should have a refill at the pharmacy. Advised to call the pharmacy and call us back if it's not there. Advised Quetiapine Fumarate 25 mg is not on his current or past medication list. She says that's probably the one the eye doctor prescribed, so she will call him. She also mentioned about the administration of the Humalog Pen, saying the provider told her to shoot the air out the pen then administer. I advised to follow those instructions given, she verbalized understanding.

## 2021-04-27 NOTE — Telephone Encounter (Signed)
Caller requesting the following medications:   JARDIANCE 25 MG TABS tablet, montelukast (SINGULAIR) 10 MG tablet , lisinopril (ZESTRIL) 20 MG tablet, tiZANidine (ZANAFLEX) 2 MG tablet, quetiapine fumarate 25MG, vitamin d  Caller called the pharmacy and was advised no refills. Patient has been seen in the year  [place], [place]. Phone:  [phone]  Fax:  [phone]

## 2021-04-27 NOTE — Telephone Encounter (Signed)
Montelukast refilled 02/24/2021 #90 with 1 refill, enough to last until 6/23. Lisinopril refilled 03/28/2021 #90/0 enough to last until 4/23. Requested Prescriptions  Pending Prescriptions Disp Refills   montelukast (SINGULAIR) 10 MG tablet 90 tablet 1    Sig: Take 1 tablet (10 mg total) by mouth at bedtime.     Pulmonology:  Leukotriene Inhibitors Passed - 04/27/2021  1:56 PM      Passed - Valid encounter within last 12 months    Recent Outpatient Visits          Yesterday Uncontrolled type 2 diabetes mellitus with hyperglycemia ([place])   [place], Jolene T, NP   3 months ago Uncontrolled type 2 diabetes mellitus with hyperglycemia ([place])   [place], Jolene T, NP   7 months ago Type 2 diabetes mellitus with proteinuria ([place])   [place], Jolene T, NP   11 months ago Uncontrolled type 2 diabetes mellitus with hyperglycemia ([place])   [place], Jolene T, NP   11 months ago Syncope, unspecified syncope type   [place], Lauren A, NP      Future Appointments            In 6 days  [place], [facility]   In 3 weeks Cannady, [place] T, NP [facility], [facility]            empagliflozin (JARDIANCE) 25 MG TABS tablet 90 tablet 0    Sig: Take 1 tablet (25 mg total) by mouth daily.     Endocrinology:  Diabetes - SGLT2 Inhibitors Failed - 04/27/2021  1:56 PM      Failed - Cr in normal range and within 360 days    Creatinine, Ser  Date Value Ref Range Status  04/26/2021 1.44 (H) 0.76 - 1.27 mg/dL Final   Creatinine, POC  Date Value Ref Range Status  08/22/2015 n/a mg/dL Final         Failed - HBA1C is between 0 and 7.9 and within 180 days    HB A1C (BAYER DCA - WAIVED)  Date Value Ref Range Status  04/26/2021 10.2 (H) 4.8 - 5.6 % Final    Comment:             Prediabetes: 5.7 - 6.4          Diabetes: >6.4          Glycemic control for adults with  diabetes: <7.0          Failed - eGFR in normal range and within 360 days    GFR calc Af Amer  Date Value Ref Range Status  02/24/2020 76 >59 mL/min/1.73 Final    Comment:    **In accordance with recommendations from the NKF-ASN Task force,**   [facility] is in the process of updating its eGFR calculation to the   2021 CKD-EPI creatinine equation that estimates kidney function   without a race variable.    GFR calc non Af Amer  Date Value Ref Range Status  02/24/2020 66 >59 mL/min/1.73 Final   eGFR  Date Value Ref Range Status  04/26/2021 56 (L) >59 mL/min/1.73 Final         Passed - Valid encounter within last 6 months    Recent Outpatient Visits          Yesterday Uncontrolled type 2 diabetes mellitus with hyperglycemia ([place])   [facility] [place], [place] T, NP   3 months ago Uncontrolled  type 2 diabetes mellitus with hyperglycemia ([place])   [place], Ney T, NP   7 months ago Type 2 diabetes mellitus with proteinuria ([place])   [place], Jolene T, NP   11 months ago Uncontrolled type 2 diabetes mellitus with hyperglycemia ([place])   [place], Jolene T, NP   11 months ago Syncope, unspecified syncope type   [place], Lauren A, NP      Future Appointments            In 6 days  [place], [facility]   In 3 weeks Cannady, Barbaraann Faster, NP [facility], [facility]            lisinopril (ZESTRIL) 20 MG tablet 90 tablet 0    Sig: Take 1 tablet (20 mg total) by mouth daily.     Cardiovascular:  ACE Inhibitors Failed - 04/27/2021  1:56 PM      Failed - Cr in normal range and within 180 days    Creatinine, Ser  Date Value Ref Range Status  04/26/2021 1.44 (H) 0.76 - 1.27 mg/dL Final   Creatinine, POC  Date Value Ref Range Status  08/22/2015 n/a mg/dL Final         Passed - K in normal range and within 180 days    Potassium  Date Value Ref Range Status   04/26/2021 4.2 3.5 - 5.2 mmol/L Final         Passed - Patient is not pregnant      Passed - Last BP in normal range    BP Readings from Last 1 Encounters:  04/26/21 110/60         Passed - Valid encounter within last 6 months    Recent Outpatient Visits          Yesterday Uncontrolled type 2 diabetes mellitus with hyperglycemia ([place])   [place], Jolene T, NP   3 months ago Uncontrolled type 2 diabetes mellitus with hyperglycemia ([place])   [place], Jolene T, NP   7 months ago Type 2 diabetes mellitus with proteinuria ([place])   [place], Jolene T, NP   11 months ago Uncontrolled type 2 diabetes mellitus with hyperglycemia ([place])   [place], Jolene T, NP   11 months ago Syncope, unspecified syncope type   [place], Lauren A, NP      Future Appointments            In 6 days  [place], [facility]   In 3 weeks Cannady, Barbaraann Faster, NP [facility], [facility]            tiZANidine (ZANAFLEX) 2 MG tablet 30 tablet 1    Sig: Take 1 tablet (2 mg total) by mouth every 6 (six) hours as needed for muscle spasms.     Not Delegated - Cardiovascular:  Alpha-2 Agonists - tizanidine Failed - 04/27/2021  1:56 PM      Failed - This refill cannot be delegated      Passed - Valid encounter within last 6 months    Recent Outpatient Visits          Yesterday Uncontrolled type 2 diabetes mellitus with hyperglycemia ([place])   [place], Jolene T, NP   3 months ago Uncontrolled type 2 diabetes mellitus with hyperglycemia ([place])   [place] Cannady, Barbaraann Faster, NP   7  months ago Type 2 diabetes mellitus with proteinuria ([place])   [place], Jolene T, NP   11 months ago Uncontrolled type 2 diabetes mellitus with hyperglycemia ([place])   [place], [place] T, NP   11 months ago Syncope,  unspecified syncope type   [place], Lauren A, NP      Future Appointments            In 6 days  [place], [place]   In 3 weeks Cannady, Barbaraann Faster, NP [facility], [facility]

## 2021-04-27 NOTE — Telephone Encounter (Signed)
Requested medications are due for refill today.  yes  Requested medications are on the active medications list.  yes  Last refill. 03/24/2020  Future visit scheduled.   yes  Notes to clinic.  Medication not delegated.    Requested Prescriptions  Pending Prescriptions Disp Refills   tiZANidine (ZANAFLEX) 2 MG tablet 30 tablet 1    Sig: Take 1 tablet (2 mg total) by mouth every 6 (six) hours as needed for muscle spasms.     Not Delegated - Cardiovascular:  Alpha-2 Agonists - tizanidine Failed - 04/27/2021  1:56 PM      Failed - This refill cannot be delegated      Passed - Valid encounter within last 6 months    Recent Outpatient Visits           Yesterday Uncontrolled type 2 diabetes mellitus with hyperglycemia ([place])   [place], Jolene T, NP   3 months ago Uncontrolled type 2 diabetes mellitus with hyperglycemia ([place])   [place], Jolene T, NP   7 months ago Type 2 diabetes mellitus with proteinuria ([place])   [place], Jolene T, NP   11 months ago Uncontrolled type 2 diabetes mellitus with hyperglycemia ([place])   [place], Jolene T, NP   11 months ago Syncope, unspecified syncope type   [place], Lauren A, NP       Future Appointments             In 6 days  [place], [facility]   In 3 weeks Cannady, [place] T, NP [facility], [facility]            Signed Prescriptions Disp Refills   empagliflozin (JARDIANCE) 25 MG TABS tablet 90 tablet 0    Sig: Take 1 tablet (25 mg total) by mouth daily.     Endocrinology:  Diabetes - SGLT2 Inhibitors Failed - 04/27/2021  1:56 PM      Failed - Cr in normal range and within 360 days    Creatinine, Ser  Date Value Ref Range Status  04/26/2021 1.44 (H) 0.76 - 1.27 mg/dL Final   Creatinine, POC  Date Value Ref Range Status  08/22/2015 n/a mg/dL Final          Failed - HBA1C is between 0 and 7.9 and  within 180 days    HB A1C (BAYER DCA - WAIVED)  Date Value Ref Range Status  04/26/2021 10.2 (H) 4.8 - 5.6 % Final    Comment:             Prediabetes: 5.7 - 6.4          Diabetes: >6.4          Glycemic control for adults with diabetes: <7.0           Failed - eGFR in normal range and within 360 days    GFR calc Af Amer  Date Value Ref Range Status  02/24/2020 76 >59 mL/min/1.73 Final    Comment:    **In accordance with recommendations from the NKF-ASN Task force,**   [facility] is in the process of updating its eGFR calculation to the   2021 CKD-EPI creatinine equation that estimates kidney function   without a race variable.    GFR calc non Af Amer  Date Value Ref Range Status  02/24/2020 66 >59 mL/min/1.73 Final   eGFR  Date Value Ref Range Status  04/26/2021 56 (L) >59 mL/min/1.73  Final          Passed - Valid encounter within last 6 months    Recent Outpatient Visits           Yesterday Uncontrolled type 2 diabetes mellitus with hyperglycemia ([place])   [place], Jolene T, NP   3 months ago Uncontrolled type 2 diabetes mellitus with hyperglycemia ([place])   [place], Jolene T, NP   7 months ago Type 2 diabetes mellitus with proteinuria ([place])   [place], Jolene T, NP   11 months ago Uncontrolled type 2 diabetes mellitus with hyperglycemia ([place])   [place], Jolene T, NP   11 months ago Syncope, unspecified syncope type   [place], Lauren A, NP       Future Appointments             In 6 days  [place], [facility]   In 3 weeks Cannady, [place] T, NP [facility], [facility]            Refused Prescriptions Disp Refills   montelukast (SINGULAIR) 10 MG tablet 90 tablet 1    Sig: Take 1 tablet (10 mg total) by mouth at bedtime.     Pulmonology:  Leukotriene Inhibitors Passed - 04/27/2021  1:56 PM      Passed - Valid encounter  within last 12 months    Recent Outpatient Visits           Yesterday Uncontrolled type 2 diabetes mellitus with hyperglycemia ([place])   [place], Jolene T, NP   3 months ago Uncontrolled type 2 diabetes mellitus with hyperglycemia ([place])   [place], Jolene T, NP   7 months ago Type 2 diabetes mellitus with proteinuria ([place])   [place], Jolene T, NP   11 months ago Uncontrolled type 2 diabetes mellitus with hyperglycemia ([place])   [place], Jolene T, NP   11 months ago Syncope, unspecified syncope type   [place], Lauren A, NP       Future Appointments             In 6 days  [place]   In 3 weeks Cannady, Henrine Screws T, NP [facility], [facility]             lisinopril (ZESTRIL) 20 MG tablet 90 tablet 0    Sig: Take 1 tablet (20 mg total) by mouth daily.     Cardiovascular:  ACE Inhibitors Failed - 04/27/2021  1:56 PM      Failed - Cr in normal range and within 180 days    Creatinine, Ser  Date Value Ref Range Status  04/26/2021 1.44 (H) 0.76 - 1.27 mg/dL Final   Creatinine, POC  Date Value Ref Range Status  08/22/2015 n/a mg/dL Final          Passed - K in normal range and within 180 days    Potassium  Date Value Ref Range Status  04/26/2021 4.2 3.5 - 5.2 mmol/L Final          Passed - Patient is not pregnant      Passed - Last BP in normal range    BP Readings from Last 1 Encounters:  04/26/21 110/60          Passed - Valid encounter within last 6 months    Recent Outpatient Visits  Yesterday Uncontrolled type 2 diabetes mellitus with hyperglycemia ([place])   [place], [place] T, NP   3 months ago Uncontrolled type 2 diabetes mellitus with hyperglycemia ([place])   [place], Jolene T, NP   7 months ago Type 2 diabetes mellitus with proteinuria ([place])   [place], Jolene T, NP   11 months ago Uncontrolled type 2 diabetes mellitus with hyperglycemia ([place])   [place], Jolene T, NP   11 months ago Syncope, unspecified syncope type   [place], Lauren A, NP       Future Appointments             In 6 days  [place], [place]   In 3 weeks Cannady, Barbaraann Faster, NP [facility], [facility]

## 2021-04-27 NOTE — Progress Notes (Signed)
Good morning crew, please let [name] and his wife know labs have returned: - Kidney function is showing some mild decline this check -- please ensure you are working hard on diabetic diet to help improve diabetes + continue medications as we discussed.  Liver function normal. - Cholesterol labs are a little higher this check, please ensure you are taking all medications as ordered. - Vitamin D level and B12 level are on lower side -- please ensure you are taking Vitamin D3 and B12 supplements daily. Remainder of labs stable.  Any questions? Cut back on those chips and sodas please!! Keep being awesome!!  Thank you for allowing me to participate in your care.  I appreciate you. Kindest regards, Fortune Torosian

## 2021-04-28 MED ORDER — TIZANIDINE HCL 2 MG PO TABS: 2.0000 mg | ORAL_TABLET | Freq: Four times a day (QID) | ORAL | 1 refills | Status: DC | PRN

## 2021-05-01 ENCOUNTER — Telehealth: Payer: Self-pay | Admitting: Nurse Practitioner

## 2021-05-01 NOTE — Telephone Encounter (Signed)
Spoke with patient wife to follow up with patient questions in regards for his labs results. Patient wife states the patient has been receiving his medication of Humalog and Morrell Riddle as instructed and states the patient was complaining of dizziness. Per Jolene advised me to informed the wife they can cut the evening dosage out. Patient wife verbalized understanding and has no further questions.

## 2021-05-01 NOTE — Telephone Encounter (Signed)
Copied from [place] [id number]. Topic: General - Other >> Apr 28, 2021  1:37 PM Tessa Lerner A wrote: Reason for CRM: The patient would like to be contacted with their lab results from 04/26/21 when possible  Please contact further when available

## 2021-05-03 ENCOUNTER — Ambulatory Visit (INDEPENDENT_AMBULATORY_CARE_PROVIDER_SITE_OTHER): Payer: Medicare Other | Admitting: *Deleted

## 2021-05-03 DIAGNOSIS — Z0000 Encounter for general adult medical examination without abnormal findings: Secondary | ICD-10-CM | POA: Diagnosis not present

## 2021-05-03 NOTE — Progress Notes (Signed)
Subjective:   Alex Space Reshad Saab. is a 61 y.o. male who presents for Medicare Annual/Subsequent preventive examination. I connected with  [name] on 05/03/21 by a telephone enabled telemedicine application and verified that I am speaking with the correct person using two identifiers.   I discussed the limitations of evaluation and management by telemedicine. The patient expressed understanding and agreed to proceed.  Patient location: home  Provider location: Tele-Health not in office    Review of Systems     Cardiac Risk Factors include: advanced age (>17mn, >>68women);diabetes mellitus;hypertension;male gender;sedentary lifestyle     Objective:    Today's Vitals   05/03/21 1204  PainSc: 0-No pain   There is no height or weight on file to calculate BMI.  Advanced Directives 05/03/2021 08/10/2020 09/02/2019 03/06/2019 03/02/2019 10/06/2018 08/19/2018  Does Patient Have a Medical Advance Directive? _0  No No  Would patient like information on creating a medical advance directive? No - Patient declined - No - Patient declined No - Patient declined No - Patient declined - No - Patient declined    Current Medications (verified) Outpatient Encounter Medications as of 05/03/2021  Medication Sig   aspirin 81 MG EC tablet Take 1 tablet (81 mg total) by mouth daily.   atorvastatin (LIPITOR) 80 MG tablet TAKE 1 TABLET BY MOUTH ONCE DAILY   Blood Glucose Monitoring Suppl (ONE TOUCH ULTRA 2) w/Device KIT Use to check blood sugar 4 times a day   BREO ELLIPTA 100-25 MCG/ACT AEPB INHALE 1 PUFF BY MOUTH ONCE DAILY   Cholecalciferol 1.25 MG (50000 UT) TABS Take 1 tablet by mouth once a week.   clobetasol cream (TEMOVATE) 07.84% Apply 1 application topically 2 (two) times daily. Apply to affected area   clopidogrel (PLAVIX) 75 MG tablet Take 1 tablet by mouth daily.   empagliflozin (JARDIANCE) 25 MG TABS tablet Take 1 tablet (25 mg total) by mouth daily.    EPINEPHRINE 0.3 mg/0.3 mL IJ SOAJ injection INJECT 1 SYRINGE INTO OUTER THIGH ONCE AS NEEDED FOR SEVERE ALLERGIC REACTION.   fenofibrate 54 MG tablet TAKE 1 TABLET BY MOUTH ONCE DAILY   gabapentin (NEURONTIN) 300 MG capsule TAKE 1 CAPSULE BY MOUTH TWICE DAILY   glucose blood (ONETOUCH ULTRA) test strip To check blood sugar three times daily   insulin degludec (TRESIBA FLEXTOUCH) 100 UNIT/ML FlexTouch Pen Take 40 units into the skin in morning and 40 units into skin every evening.   insulin lispro (HUMALOG KWIKPEN) 100 UNIT/ML KwikPen Inject 5 Units into the skin 3 (three) times daily. Prior to breakfast, lunch, and dinner.   Insulin Pen Needle (PEN NEEDLES) 32G X 4 MM MISC 1 kit by Does not apply route daily.   Lancets (ONETOUCH DELICA PLUS LONGEXB28U MISC USE AS DIRECTED.   lisinopril (ZESTRIL) 20 MG tablet TAKE 1 TABLET BY MOUTH ONCE DAILY   Misc. Devices (PULSE OXIMETER FOR FINGER) MISC To check O2 saturations once daily with asthma and document + check if any shortness of breath   montelukast (SINGULAIR) 10 MG tablet TAKE 1 TABLET BY MOUTH AT BEDTIME   nitroGLYCERIN (NITROSTAT) 0.4 MG SL tablet Place 1 tablet (0.4 mg total) under the tongue every 5 (five) minutes as needed for chest pain.   omeprazole (PRILOSEC) 20 MG capsule TAKE 1 CAPSULE BY MOUTH ONCE DAILY   tiZANidine (ZANAFLEX) 2 MG tablet Take 1 tablet (2 mg total) by mouth every 6 (six) hours as needed for muscle spasms.  trimethoprim-polymyxin b (POLYTRIM) ophthalmic solution Place 1 drop into the right eye every 6 (six) hours.    ULTRACARE PEN NEEDLES 32G X 5 MM MISC    vitamin B-12 (CYANOCOBALAMIN) 1000 MCG tablet Take 1 tablet (1,000 mcg total) by mouth daily.   No facility-administered encounter medications on file as of 05/03/2021.    Allergies (verified) Bee venom, Other, and Sulfa antibiotics   History: Past Medical History:  Diagnosis Date   Arrhythmia    Asthma    Cyst of kidney, acquired    Diabetes mellitus  without complication ([place]) 5056   type 2   Fatty liver    GERD (gastroesophageal reflux disease)    History of chicken pox    History of measles as a child    History of PKU    Hyperlipidemia    Hypertension    IBS (irritable bowel syndrome)    Irregular heart beat    Mentally challenged    Pancreatitis    Stroke ([place]) 03/02/2019   Past Surgical History:  Procedure Laterality Date   Cardiac Catherization     [place]   CARDIAC CATHETERIZATION     [facility]   COLONOSCOPY     COLONOSCOPY WITH PROPOFOL N/A 08/10/2020   Procedure: COLONOSCOPY WITH PROPOFOL;  Surgeon: Lin Landsman, MD;  Location: [facility];  Service: Gastroenterology;  Laterality: N/A;  Has ankle monitor; (Parole officer "Ms Andree Moro needs a time before Monday [phone]" - per patient's wife)   ESOPHAGOGASTRODUODENOSCOPY (EGD) WITH PROPOFOL N/A 12/27/2016   Procedure: ESOPHAGOGASTRODUODENOSCOPY (EGD) WITH PROPOFOL;  Surgeon: Lin Landsman, MD;  Location: [facility];  Service: Gastroenterology;  Laterality: N/A;   HEMORRHOID SURGERY     Ligament Removal Left    of left thumb: dr. Cleda Mccreedy   ligament removal  of left thumb     Dr. Cleda Mccreedy   NM GATED MYOCARDIAL STUDY (ARMX HX)  06/23/2014   [place]. Normal   Family History  Problem Relation Age of Onset   Cancer Mother        throat   Diabetes Father    Heart disease Father    Cancer Maternal Grandmother    Cancer Maternal Grandfather        pancreatic   Cancer Paternal Grandfather    Social History   Socioeconomic History   Marital status: Married    Spouse name: Not on file   Number of children: 0   Years of education: Not on file   Highest education level: 12th grade  Occupational History   Occupation: Disabled  Tobacco Use   Smoking status: Never   Smokeless tobacco: Never  Vaping Use   Vaping Use: Never used  Substance and Sexual Activity   Alcohol use: No    Alcohol/week: 0.0 standard drinks   Drug use: No   Sexual activity:  Not Currently  Other Topics Concern   Not on file  Social History Narrative   ** Merged History Encounter **       ** Data from: 10/22/14 Enc Dept: [facility]       ** Data from: 10/07/14 Enc Dept: [facility]   Arrest: Arrested in 2012 for indecent liberties, required to wear ankle monitor   Social Determinants of Health   Financial Resource Strain: Low Risk    Difficulty of Paying Living Expenses: Not very hard  Food Insecurity: No Food Insecurity   Worried About Running Out of Food in the Last Year: Never true   Ran Out  of Food in the Last Year: Never true  Transportation Needs: No Transportation Needs   Lack of Transportation (Medical): No   Lack of Transportation (Non-Medical): No  Physical Activity: Inactive   Days of Exercise per Week: 0 days   Minutes of Exercise per Session: 0 min  Stress: No Stress Concern Present   Feeling of Stress : Only a little  Social Connections: Moderately Integrated   Frequency of Communication with Friends and Family: Twice a week   Frequency of Social Gatherings with Friends and Family: Once a week   Attends Religious Services: More than 4 times per year   Active Member of [employer] or Organizations: No   Attends Music therapist: Never   Marital Status: Married    Tobacco Counseling Counseling given: Not Answered   Clinical Intake:  Pre-visit preparation completed: Yes  Pain : No/denies pain Pain Score: 0-No pain     Diabetes: Yes CBG done?: No CBG resulted in Enter/ Edit results?: No Did pt. bring in CBG monitor from home?: No  How often do you need to have someone help you when you read instructions, pamphlets, or other written materials from your doctor or pharmacy?: 4 - Often  Diabetic?  Yes  Nutrition Risk Assessment:  Has the patient had any N/V/D within the last 2 months?  No  Does the patient have any non-healing wounds?  No  Has the patient had any unintentional weight loss or  weight gain?  No   Diabetes:  Is the patient diabetic?  Yes  If diabetic, was a CBG obtained today?  No  Did the patient bring in their glucometer from home?  No  How often do you monitor your CBG's? 3 x a day.   Financial Strains and Diabetes Management:  Are you having any financial strains with the device, your supplies or your medication? Yes .  Does the patient want to be seen by Chronic Care Management for management of their diabetes?  No  Would the patient like to be referred to a Nutritionist or for Diabetic Management?  No   Diabetic Exams:  Diabetic Eye Exam: Completed .  Pt has been advised about the importance in completing this exam.   Diabetic Foot Exam: Completed . Pt has been advised about the importance in completing this exam.  Interpreter Needed?: No  Information entered by :: Leroy Kennedy LPN   Activities of Daily Living In your present state of health, do you have any difficulty performing the following activities: 05/03/2021  Hearing? N  Vision? N  Difficulty concentrating or making decisions? N  Walking or climbing stairs? Y  Dressing or bathing? N  Doing errands, shopping? Y  Preparing Food and eating ? N  Using the Toilet? N  In the past six months, have you accidently leaked urine? N  Do you have problems with loss of bowel control? N  Managing your Medications? Y  Managing your Finances? N  Housekeeping or managing your Housekeeping? N  Some recent data might be hidden    Patient Care Team: [name], NP as PCP - General (Nurse Practitioner) Anell Barr, [place] (Optometry) Dionisio David, MD as Consulting Physician (Cardiology) Vanita Ingles, RN as Case Manager ([place]) Vladimir Faster, [facility] (Inactive) as Pharmacist (Pharmacist) Rebekah [place]field, LCSW as Social Worker (Licensed Clinical Social Worker)  Indicate any recent [place] you may have received from other than [facility] providers in the past year (date may be  approximate).     Assessment:   This is a routine wellness examination for Mal[place].  Hearing/Vision screen Hearing Screening - Comments:: No trouble hearing Vision Screening - Comments:: [place] Up to date  Dietary issues and exercise activities discussed: Current Exercise Habits: The patient does not participate in regular exercise at present   Goals Addressed             This Visit's Progress    Increase water intake   On track    Starting 05/15/16, I will start drinking 2-3 glasses of water a day.     Patient Stated       Would like to cut down on salt intake Continue with drinking water 2-3 glasses a day       Depression Screen PHQ 2/9 Scores 05/03/2021 04/26/2021 01/03/2021 09/21/2020 09/02/2019 10/06/2018 08/19/2018  PHQ - 2 Score 1 0 0 0 0 0 0  PHQ- 9 Score 7 13 - - - - -    Fall Risk Fall Risk  05/03/2021 04/26/2021 09/02/2019 10/06/2018 09/03/2018  Falls in the past year? _0 0  Number falls in past yr: 1 1 0 0 -  Comment - - - passed out; "I got light headeddd". Wife said due to low blood sugar -  Injury with Fall? 1 1 0 1 -  Comment - - - bruised head -  Risk for fall due to : Impaired balance/gait History of fall(s);Impaired balance/gait;Impaired mobility - History of fall(s) -  Follow up Falls evaluation completed;Education provided;Falls prevention discussed Falls evaluation completed - Falls prevention discussed Falls evaluation completed    FALL RISK PREVENTION PERTAINING TO THE HOME:  Any stairs in or around the home? No  If so, are there any without handrails? No  Home free of loose throw rugs in walkways, pet beds, electrical cords, etc? Yes  Adequate lighting in your home to reduce risk of falls? Yes   ASSISTIVE DEVICES UTILIZED TO PREVENT FALLS:  Life alert? No  Use of a cane, walker or w/c? Yes  Grab bars in the bathroom? No  Shower chair or bench in shower? Yes  Elevated toilet seat or a handicapped toilet? No   TIMED UP AND  GO:  Was the test performed? No .    Cognitive Function:     6CIT Screen 05/03/2021 05/15/2016  What Year? 0 points 0 points  What month? 0 points 3 points  What time? 0 points 3 points  Count back from 20 0 points 0 points  Months in reverse 0 points 0 points  Repeat phrase 0 points 10 points  Total Score 0 16    Immunizations Immunization History  Administered Date(s) Administered   Hepatitis A, Adult 09/26/2016, 05/22/2017   Hepatitis B, adult 09/26/2016, 05/22/2017, 11/29/2017   Influenza,inj,Quad PF,6+ Mos 12/22/2012, 12/21/2013, 11/29/2017, 12/02/2018, 01/18/2021   Influenza-Unspecified 11/30/2014, 11/18/2015   Moderna Sars-Covid-2 Vaccination 11/16/2019, 12/14/2019, 12/14/2019   Pneumococcal Polysaccharide-23 12/22/2012, 11/18/2015   Td 01/18/2021    TDAP status: Up to date  Flu Vaccine status: Up to date  Pneumococcal vaccine status: Up to date  Covid-19 vaccine status: Information provided on how to obtain vaccines.   Qualifies for Shingles Vaccine? Yes   Zostavax completed No   Shingrix Completed?: No.    Education has been provided regarding the importance of this vaccine. Patient has been advised to call insurance company to determine out of pocket expense if they have not yet received this vaccine. Advised  this vaccine. Advised may also receive vaccine at local pharmacy or Health Dept. Verbalized acceptance and understanding.  Screening Tests Health Maintenance  Topic Date Due   Zoster Vaccines- Shingrix (1 of 2) Never done   COVID-19 Vaccine (4 - Booster) 02/08/2020   FOOT EXAM  02/23/2021   HEMOGLOBIN A1C  10/24/2021   OPHTHALMOLOGY EXAM  11/22/2021   COLONOSCOPY (Pts 45-27yr Insurance coverage will need to be confirmed)  08/11/2027   TETANUS/TDAP  01/19/2031   INFLUENZA VACCINE  Completed   Hepatitis C Screening  Completed   HIV Screening  Completed   HPV VACCINES  Aged Out   Fecal DNA (Cologuard)  Discontinued    Health Maintenance  Health Maintenance Due   Topic Date Due   Zoster Vaccines- Shingrix (1 of 2) Never done   COVID-19 Vaccine (4 - Booster) 02/08/2020   FOOT EXAM  02/23/2021    Colorectal cancer screening: Type of screening: Colonoscopy. Completed 2022. Repeat every 7 years  Lung Cancer Screening: (Low Dose CT Chest recommended if Age 59-80years, 30 pack-year currently smoking OR have quit w/in 15years.) does not qualify.   Lung Cancer Screening Referral:   Additional Screening:  Hepatitis C Screening: does not qualify; Completed 2017  Vision Screening: Recommended annual ophthalmology exams for early detection of glaucoma and other disorders of the eye. Is the patient up to date with their annual eye exam?  Yes  Who is the provider or what is the name of the office in which the patient attends annual eye exams? [facility] WE[facility]in MayhewIf pt is not established with a provider, would they like to be referred to a provider to establish care? No .   Dental Screening: Recommended annual dental exams for proper oral hygiene  Community Resource Referral / Chronic Care Management: CRR required this visit?  No   CCM required this visit?  No      Plan:     I have personally reviewed and noted the following in the patients chart:   Medical and social history Use of alcohol, tobacco or illicit drugs  Current medications and supplements including opioid prescriptions. Patient is not currently taking opioid prescriptions. Functional ability and status Nutritional status Physical activity Advanced directives List of other physicians Hospitalizations, surgeries, and ER visits in previous 12 months Vitals Screenings to include cognitive, depression, and falls Referrals and appointments  In addition, I have reviewed and discussed with patient certain preventive protocols, quality metrics, and best practice recommendations. A written personalized care plan for preventive services as well as general preventive health  recommendations were provided to patient.     JLeroy Kennedy LPN   26/06/5407  Nurse Notes:

## 2021-05-03 NOTE — Patient Instructions (Signed)
[name] , Thank you for taking time to come for your Medicare Wellness Visit. I appreciate your ongoing commitment to your health goals. Please review the following plan we discussed and let me know if I can assist you in the future.   Screening recommendations/referrals: Colonoscopy: up to date Recommended yearly ophthalmology/optometry visit for glaucoma screening and checkup Recommended yearly dental visit for hygiene and checkup  Vaccinations: Influenza vaccine: up to date Pneumococcal vaccine: up to date Tdap vaccine: up to date Shingles vaccine: Education provided    Advanced directives:   Education provided  Conditions/risks identified:    Preventive Care 40-64 Years, Male Preventive care refers to lifestyle choices and visits with your health care provider that can promote health and wellness. What does preventive care include? A yearly physical exam. This is also called an annual well check. Dental exams once or twice a year. Routine eye exams. Ask your health care provider how often you should have your eyes checked. Personal lifestyle choices, including: Daily care of your teeth and gums. Regular physical activity. Eating a healthy diet. Avoiding tobacco and drug use. Limiting alcohol use. Practicing safe sex. Taking low-dose aspirin every day starting at age 47. What happens during an annual well check? The services and screenings done by your health care provider during your annual well check will depend on your age, overall health, lifestyle risk factors, and family history of disease. Counseling  Your health care provider may ask you questions about your: Alcohol use. Tobacco use. Drug use. Emotional well-being. Home and relationship well-being. Sexual activity. Eating habits. Work and work Statistician. Screening  You may have the following tests or measurements: Height, weight, and BMI. Blood pressure. Lipid and cholesterol levels. These may be  checked every 5 years, or more frequently if you are over 50 years old. Skin check. Lung cancer screening. You may have this screening every year starting at age 21 if you have a 30-pack-year history of smoking and currently smoke or have quit within the past 15 years. Fecal occult blood test (FOBT) of the stool. You may have this test every year starting at age 9. Flexible sigmoidoscopy or colonoscopy. You may have a sigmoidoscopy every 5 years or a colonoscopy every 10 years starting at age 32. Prostate cancer screening. Recommendations will vary depending on your family history and other risks. Hepatitis C blood test. Hepatitis B blood test. Sexually transmitted disease (STD) testing. Diabetes screening. This is done by checking your blood sugar (glucose) after you have not eaten for a while (fasting). You may have this done every 1-3 years. Discuss your test results, treatment options, and if necessary, the need for more tests with your health care provider. Vaccines  Your health care provider may recommend certain vaccines, such as: Influenza vaccine. This is recommended every year. Tetanus, diphtheria, and acellular pertussis (Tdap, Td) vaccine. You may need a Td booster every 10 years. Zoster vaccine. You may need this after age 35. Pneumococcal 13-valent conjugate (PCV13) vaccine. You may need this if you have certain conditions and have not been vaccinated. Pneumococcal polysaccharide (PPSV23) vaccine. You may need one or two doses if you smoke cigarettes or if you have certain conditions. Talk to your health care provider about which screenings and vaccines you need and how often you need them. This information is not intended to replace advice given to you by your health care provider. Make sure you discuss any questions you have with your health care provider. Document Released: 04/01/2015 Document  Revised: 11/23/2015 Document Reviewed: 01/04/2015 Elsevier Interactive Patient  Education  2017 [place] Prevention in the Home Falls can cause injuries. They can happen to people of all ages. There are many things you can do to make your home safe and to help prevent falls. What can I do on the outside of my home? Regularly fix the edges of walkways and driveways and fix any cracks. Remove anything that might make you trip as you walk through a door, such as a raised step or threshold. Trim any bushes or trees on the path to your home. Use bright outdoor lighting. Clear any walking paths of anything that might make someone trip, such as rocks or tools. Regularly check to see if handrails are loose or broken. Make sure that both sides of any steps have handrails. Any raised decks and porches should have guardrails on the edges. Have any leaves, snow, or ice cleared regularly. Use sand or salt on walking paths during winter. Clean up any spills in your garage right away. This includes oil or grease spills. What can I do in the bathroom? Use night lights. Install grab bars by the toilet and in the tub and shower. Do not use towel bars as grab bars. Use non-skid mats or decals in the tub or shower. If you need to sit down in the shower, use a plastic, non-slip stool. Keep the floor dry. Clean up any water that spills on the floor as soon as it happens. Remove soap buildup in the tub or shower regularly. Attach bath mats securely with double-sided non-slip rug tape. Do not have throw rugs and other things on the floor that can make you trip. What can I do in the bedroom? Use night lights. Make sure that you have a light by your bed that is easy to reach. Do not use any sheets or blankets that are too big for your bed. They should not hang down onto the floor. Have a firm chair that has side arms. You can use this for support while you get dressed. Do not have throw rugs and other things on the floor that can make you trip. What can I do in the  kitchen? Clean up any spills right away. Avoid walking on wet floors. Keep items that you use a lot in easy-to-reach places. If you need to reach something above you, use a strong step stool that has a grab bar. Keep electrical cords out of the way. Do not use floor polish or wax that makes floors slippery. If you must use wax, use non-skid floor wax. Do not have throw rugs and other things on the floor that can make you trip. What can I do with my stairs? Do not leave any items on the stairs. Make sure that there are handrails on both sides of the stairs and use them. Fix handrails that are broken or loose. Make sure that handrails are as long as the stairways. Check any carpeting to make sure that it is firmly attached to the stairs. Fix any carpet that is loose or worn. Avoid having throw rugs at the top or bottom of the stairs. If you do have throw rugs, attach them to the floor with carpet tape. Make sure that you have a light switch at the top of the stairs and the bottom of the stairs. If you do not have them, ask someone to add them for you. What else can I do to help prevent falls? Wear  shoes that: Do not have high heels. Have rubber bottoms. Are comfortable and fit you well. Are closed at the toe. Do not wear sandals. If you use a stepladder: Make sure that it is fully opened. Do not climb a closed stepladder. Make sure that both sides of the stepladder are locked into place. Ask someone to hold it for you, if possible. Clearly mark and make sure that you can see: Any grab bars or handrails. First and last steps. Where the edge of each step is. Use tools that help you move around (mobility aids) if they are needed. These include: Canes. Walkers. Scooters. Crutches. Turn on the lights when you go into a dark area. Replace any light bulbs as soon as they burn out. Set up your furniture so you have a clear path. Avoid moving your furniture around. If any of your floors are  uneven, fix them. If there are any pets around you, be aware of where they are. Review your medicines with your doctor. Some medicines can make you feel dizzy. This can increase your chance of falling. Ask your doctor what other things that you can do to help prevent falls. This information is not intended to replace advice given to you by your health care provider. Make sure you discuss any questions you have with your health care provider. Document Released: 12/30/2008 Document Revised: 08/11/2015 Document Reviewed: 04/09/2014 Elsevier Interactive Patient Education  2017 [employer].

## 2021-05-08 ENCOUNTER — Ambulatory Visit (INDEPENDENT_AMBULATORY_CARE_PROVIDER_SITE_OTHER): Payer: Medicare Other | Admitting: Licensed Clinical Social Worker

## 2021-05-08 DIAGNOSIS — I152 Hypertension secondary to endocrine disorders: Secondary | ICD-10-CM

## 2021-05-08 DIAGNOSIS — E1169 Type 2 diabetes mellitus with other specified complication: Secondary | ICD-10-CM

## 2021-05-08 DIAGNOSIS — E1165 Type 2 diabetes mellitus with hyperglycemia: Secondary | ICD-10-CM

## 2021-05-08 DIAGNOSIS — E785 Hyperlipidemia, unspecified: Secondary | ICD-10-CM

## 2021-05-08 DIAGNOSIS — E1159 Type 2 diabetes mellitus with other circulatory complications: Secondary | ICD-10-CM

## 2021-05-08 NOTE — Chronic Care Management (AMB) (Signed)
Chronic Care Management    Clinical Social Work Note  05/08/2021 Name: [name]. MRN: [id number] DOB: December 18, 1960  Alex Metro. is a 61 y.o. year old male who is a primary care patient of Cannady, Barbaraann Faster, NP. The CCM team was consulted to assist the patient with chronic disease management and/or care coordination needs related to: [name] .   Engaged with patient's spouse by telephone for follow up visit in response to provider referral for social work chronic care management and care coordination services.   Consent to Services:  The patient was given information about Chronic Care Management services, agreed to services, and gave verbal consent prior to initiation of services.  Please see initial visit note for detailed documentation.   Patient agreed to services and consent obtained.   Assessment: Review of patient past medical history, allergies, medications, and health status, including review of relevant consultants reports was performed today as part of a comprehensive evaluation and provision of chronic care management and care coordination services.     SDOH (Social Determinants of Health) assessments and interventions performed:    Advanced Directives Status: Not addressed in this encounter.  CCM Care Plan  Allergies  Allergen Reactions   Bee Venom Hives    All kinds of bees   Other     Certain powders   Sulfa Antibiotics     Outpatient Encounter Medications as of 05/08/2021  Medication Sig   aspirin 81 MG EC tablet Take 1 tablet (81 mg total) by mouth daily.   atorvastatin (LIPITOR) 80 MG tablet TAKE 1 TABLET BY MOUTH ONCE DAILY   Blood Glucose Monitoring Suppl (ONE TOUCH ULTRA 2) w/Device KIT Use to check blood sugar 4 times a day   BREO ELLIPTA 100-25 MCG/ACT AEPB INHALE 1 PUFF BY MOUTH ONCE DAILY   Cholecalciferol 1.25 MG (50000 UT) TABS Take 1 tablet by mouth once a week.   clobetasol cream (TEMOVATE) 6.76 % Apply 1 application  topically 2 (two) times daily. Apply to affected area   clopidogrel (PLAVIX) 75 MG tablet Take 1 tablet by mouth daily.   empagliflozin (JARDIANCE) 25 MG TABS tablet Take 1 tablet (25 mg total) by mouth daily.   EPINEPHRINE 0.3 mg/0.3 mL IJ SOAJ injection INJECT 1 SYRINGE INTO OUTER THIGH ONCE AS NEEDED FOR SEVERE ALLERGIC REACTION.   fenofibrate 54 MG tablet TAKE 1 TABLET BY MOUTH ONCE DAILY   gabapentin (NEURONTIN) 300 MG capsule TAKE 1 CAPSULE BY MOUTH TWICE DAILY   glucose blood (ONETOUCH ULTRA) test strip To check blood sugar three times daily   insulin degludec (TRESIBA FLEXTOUCH) 100 UNIT/ML FlexTouch Pen Take 40 units into the skin in morning and 40 units into skin every evening.   insulin lispro (HUMALOG KWIKPEN) 100 UNIT/ML KwikPen Inject 5 Units into the skin 3 (three) times daily. Prior to breakfast, lunch, and dinner.   Insulin Pen Needle (PEN NEEDLES) 32G X 4 MM MISC 1 kit by Does not apply route daily.   Lancets (ONETOUCH DELICA PLUS [id number]) MISC USE AS DIRECTED.   lisinopril (ZESTRIL) 20 MG tablet TAKE 1 TABLET BY MOUTH ONCE DAILY   Misc. Devices (PULSE OXIMETER FOR FINGER) MISC To check O2 saturations once daily with asthma and document + check if any shortness of breath   montelukast (SINGULAIR) 10 MG tablet TAKE 1 TABLET BY MOUTH AT BEDTIME   nitroGLYCERIN (NITROSTAT) 0.4 MG SL tablet Place 1 tablet (0.4 mg total) under the tongue every 5 (five) minutes  as needed for chest pain.   omeprazole (PRILOSEC) 20 MG capsule TAKE 1 CAPSULE BY MOUTH ONCE DAILY   tiZANidine (ZANAFLEX) 2 MG tablet Take 1 tablet (2 mg total) by mouth every 6 (six) hours as needed for muscle spasms.   trimethoprim-polymyxin b (POLYTRIM) ophthalmic solution Place 1 drop into the right eye every 6 (six) hours.    ULTRACARE PEN NEEDLES 32G X 5 MM MISC    vitamin B-12 (CYANOCOBALAMIN) 1000 MCG tablet Take 1 tablet (1,000 mcg total) by mouth daily.   No facility-administered encounter medications on file as  of 05/08/2021.    Patient Active Problem List   Diagnosis Date Noted   B12 deficiency 04/26/2021   Heart murmur 09/21/2020   Carotid artery bruit 09/21/2020   Type 2 diabetes mellitus with proteinuria ([place]) 03/25/2019   Atherosclerosis of both carotid arteries 03/22/2019   History of CVA (cerebrovascular accident) 03/06/2019   Uncontrolled type 2 diabetes mellitus with hyperglycemia ([place])    Obesity 09/03/2018   Frequent falls 05/15/2016   Vitamin D deficiency 08/22/2015   Carpal tunnel syndrome on left 05/12/2015   External hemorrhoids 12/20/2014   Allergic rhinitis 10/21/2014   Diverticulosis 10/21/2014   Hypertension associated with diabetes ([place]) 10/21/2014   Hyperlipidemia associated with type 2 diabetes mellitus ([place]) 10/21/2014   Asthma 10/21/2014   GERD (gastroesophageal reflux disease) 10/21/2014   NAFLD (nonalcoholic fatty liver disease) 10/21/2014   Arthritis 10/21/2014   History of pancreatitis 10/07/2014   Phenylketonuria (PKU) ([place]) 10/07/2014   Renal cyst, left 10/07/2014   Gall bladder polyp 10/01/2013    Conditions to be addressed/monitored: HTN and DMII; Housing barriers and Cognitive Deficits  Care Plan : General Social Work (Adult)  Updates made by Rebekah Chesterfield, LCSW since 05/08/2021 12:00 AM     Problem: Coping Skills (General Plan of Care)      Long-Range Goal: Coping Skills Enhanced   Start Date: 05/16/2020  This Visit's Progress: On track  Recent Progress: On track  Priority: Medium  Note:   Current Barriers:  Financial constraints Limited social support Level of care concerns ADL IADL limitations Literacy concerns Cognitive Deficits Lacks knowledge of community resource: available financial support resources within the area as well as socialization opportunities within the area Grief support related to the recent loss of his mother Clinical Social Work Clinical Goal(s):  Over the next 120 days, client will work with SW to address  concerns related to improving self-care and implementing it whenever able Interventions: Patient's spouse was interviewed in regards to patient and appropriate assessments performed Patient has been doing well managing his blood sugars and been staying in a good mood. Per spouse, patient is often dancing and singing around the house. The family have began to decorate for the upcoming holidays 12/09: This is pt's first Christmas since the passing of his mother. Strategies to cope with grief during holidays 2/20: Spouse reports increase in stress triggered by decrease in SSI benefits (due to pt's increase) and lot rent increase. Family is providing emotional and financial support, to their best ability. Still receive SNAP benefits but scared that her medicaid will be terminated. CCM LCSW strongly encouraged [name] to contact her MA CM to discuss concerns Patient experiences anxiety regarding upcoming procedure on knee scheduled next month. Concerned about limitations during the healing process (approx. 4-6 weeks) 09/30: Patient is healing from recent surgery. Spouse states he takes Tylenol extra strength and ibuprofen when he is feeling sore. Spouse cleanses his wound. Patient has  a follow up appt with surgeon on Monday, October 3, ,22 to remove stiches Spouse reports strain paying the increase in rent, in addition, to medical bills. It's been a struggle. Per patient, charity care denied assistance until bills are $5000 12/09: Family receives support from family and friends. Utilizes local food pantries Patient's blood sugars have improved since he has been actively monitoring what he eats and drinks to promote health and well-being. His sleep has improved 12/09: Patient's wife shared they continue to log blood sugars and check blood pressure Patient has been mowing the yard and using exercise bike to promote activity Patient utilizes pantries to obtain food, when needed 08/31: Family receives assistance from  local pantries and rescue missions every month. They are considering applying for Food Stamps and are interested in Christmas Cheer assistance-LCSW agreed to look into this 09/30: Patient continues to mindfulness with diet to assist with management of diabetes. Family has applied for SNAP benefits to assist with food insecurity. CCM LCSW informed spouse of [facility] to ensure he participates in upcoming medical appointments. Spouse shared she will let CCM LCSW know if a referral is needed in the future Family receives strong support from friends who assist with clothing Strategies to assist with promoting mood and stress management was discussed. Family agreed to practice gratitude thinking on a routine basis 2/20: CCM LCSW discussed supportive resources, such as, Administrator, sports Resources/211 to provide additional support CCM LCSW reviewed upcoming appointments  Discussed plans with patient for ongoing care management follow up and provided patient with direct contact information for care management team Advised patient's spouse to contact CCM embedded practice providers and PCP if any urgent concerns arise. Brief self-care education provided to spouse during outreach today. Positive reinforcement provided for positive self-care implementation.  Patient Self Care Activities:  Attend all scheduled provider appointments Call provider office for new concerns or questions Continue to utilize healthy coping skills to assist in management of symptoms          Christa See, MSW, [email or web address] Phone [phone] 1:08 PM

## 2021-05-08 NOTE — Patient Instructions (Signed)
Visit Information  Thank you for taking time to visit with me today. Please don't hesitate to contact me if I can be of assistance to you before our next scheduled telephone appointment.  Following are the goals we discussed today:  Patient Self Care Activities:  Attend all scheduled provider appointments Call provider office for new concerns or questions Continue to utilize healthy coping skills to assist in management of symptoms  Our next appointment is by telephone on 05/17/21 at 11:30 AM  Please call the care guide team at [phone] if you need to cancel or reschedule your appointment.   If you are experiencing a Mental Health or [place] or need someone to talk to, please call the [place] National Suicide Prevention Lifeline: 413-737-7455 or TTY: 930-333-3907 TTY (223)090-2196) to talk to a trained counselor call 911   The patient verbalized understanding of instructions, educational materials, and care plan provided today and declined offer to receive copy of patient instructions, educational materials, and care plan.   Christa See, MSW, [email or web address] Phone [phone] 1:10 PM

## 2021-05-09 ENCOUNTER — Other Ambulatory Visit: Payer: Self-pay | Admitting: Nurse Practitioner

## 2021-05-10 NOTE — Telephone Encounter (Signed)
Requested Prescriptions  Pending Prescriptions Disp Refills   glucose blood (ONETOUCH ULTRA) test strip [Pharmacy Med Name: ONETOUCH ULTRA STRIP] 300 each 3    Sig: USE TO [place] BLOOD SUGAR 3 TIMES A DAY AS DIRECTED     Endocrinology: Diabetes - Testing Supplies Passed - 05/09/2021  4:12 PM      Passed - Valid encounter within last 12 months    Recent Outpatient Visits          2 weeks ago Uncontrolled type 2 diabetes mellitus with hyperglycemia ([place])   [place], Jolene T, NP   3 months ago Uncontrolled type 2 diabetes mellitus with hyperglycemia ([place])   [place], Jolene T, NP   7 months ago Type 2 diabetes mellitus with proteinuria ([place])   [place], Jolene T, NP   11 months ago Uncontrolled type 2 diabetes mellitus with hyperglycemia ([place])   [place] Cannady, Jolene T, NP   11 months ago Syncope, unspecified syncope type   [place], Lauren A, NP      Future Appointments            In 2 weeks Cannady, Barbaraann Faster, NP [employer], [facility]

## 2021-05-15 NOTE — Progress Notes (Unsigned)
Chronic Care Management Pharmacy Note  05/22/2021 Name:  [name].  MRN:  [id number] DOB:  02/14/1961  Summary: -DM - better diet, less soda, more water now. No lows 05/22/2021  Plan:  Subjective: [name]. is an 61 y.o. year old male who is a primary patient of Cannady, Barbaraann Faster, NP.  The CCM team was consulted for assistance with disease management and care coordination needs.    Engaged with patient by telephone for follow up visit in response to provider referral for pharmacy case management and/or care coordination services.   Consent to Services:  The patient was given information about Chronic Care Management services, agreed to services, and gave verbal consent prior to initiation of services.  Please see initial visit note for detailed documentation.   Patient Care Team: [name], NP as PCP - General (Nurse Practitioner) Anell Barr, OD (Optometry) Dionisio David, MD as Consulting Physician (Cardiology) Vanita Ingles, RN as Case Manager (General Practice) Vladimir Faster, [facility] (Inactive) as Pharmacist (Pharmacist) Rebekah Chesterfield, LCSW as Social Worker (Licensed Clinical Social Worker)  Objective:  Lab Results  Component Value Date   CREATININE 1.44 (H) 04/26/2021   CREATININE 1.25 01/18/2021   CREATININE 1.03 06/01/2020    Lab Results  Component Value Date   HGBA1C 10.2 (H) 04/26/2021   Last diabetic Eye exam:  Lab Results  Component Value Date/Time   HMDIABEYEEXA Retinopathy (A) 11/22/2020 12:00 AM    Last diabetic Foot exam: No results found for: HMDIABFOOTEX      Component Value Date/Time   CHOL 170 04/26/2021 1425   CHOL 136 12/02/2018 1408   TRIG 240 (H) 04/26/2021 1425   TRIG 145 12/02/2018 1408   HDL 37 (L) 04/26/2021 1425   CHOLHDL 5.3 03/03/2019 0447   VLDL 44 (H) 03/03/2019 0447   VLDL 229 (H) 12/02/2018 1408   [place]kinson 92 04/26/2021 1425    Hepatic Function Latest Ref Rng & Units 04/26/2021  01/18/2021 06/01/2020  Total Protein 6.0 - 8.5 g/dL 7.1 6.6 6.8  Albumin 3.8 - 4.9 g/dL 4.8 4.4 4.5  AST 0 - 40 IU/L _0 ALT 0 - 44 IU/L 36 27 29  Alk Phosphatase 44 - 121 IU/L 148(H) 144(H) 187(H)  Total Bilirubin 0.0 - 1.2 mg/dL 0.5 0.3 0.3  Bilirubin, Direct 0.1 - 0.5 mg/dL - - -    Lab Results  Component Value Date/Time   TSH 1.130 04/26/2021 02:25 PM   TSH 0.881 06/01/2020 03:16 PM    CBC Latest Ref Rng & Units 04/26/2021 01/18/2021 05/23/2020  WBC 3.4 - 10.8 x10E3/uL 4.8 5.0 5.6  Hemoglobin 13.0 - 17.7 g/dL 14.6 13.4 13.7  Hematocrit 37.5 - 51.0 % 43.9 42.6 42.6  Platelets 150 - 450 x10E3/uL 174 176 196    Lab Results  Component Value Date/Time   VD25OH 22.5 (L) 04/26/2021 02:25 PM   VD25OH 37.7 06/28/2020 09:16 AM    Clinical ASCVD:  The 10-year ASCVD risk score (Arnett DK, et al., 2019) is: 15.4%   Values used to calculate the score:     Age: 67 years     Sex: Male     Is Non-Hispanic African American: No     Diabetic: Yes     Tobacco smoker: No     Systolic Blood Pressure: 671 mmHg     Is BP treated: Yes     HDL Cholesterol: 37 mg/dL     Total  Cholesterol: 170 mg/dL    Other: (CHADS2VASc if Afib, PHQ9 if depression, MMRC or CAT for COPD, ACT, DEXA)  Social History   Tobacco Use  Smoking Status Never  Smokeless Tobacco Never   BP Readings from Last 3 Encounters:  04/26/21 110/60  01/18/21 112/65  09/21/20 106/64   Pulse Readings from Last 3 Encounters:  04/26/21 60  01/18/21 60  09/21/20 66   Wt Readings from Last 3 Encounters:  04/26/21 181 lb 3.2 oz (82.2 kg)  01/18/21 179 lb 3.2 oz (81.3 kg)  09/21/20 174 lb 3.2 oz (79 kg)    Assessment: Review of patient past medical history, allergies, medications, health status, including review of consultants reports, laboratory and other test data, was performed as part of comprehensive evaluation and provision of chronic care management services.   SDOH:  (Social Determinants of Health) assessments  and interventions performed: Yes   CCM Care Plan  Allergies  Allergen Reactions   Bee Venom Hives    All kinds of bees   Other     Certain powders   Sulfa Antibiotics     Medications Reviewed Today     Reviewed by Suszanne Finch, LPN (Licensed Practical Nurse) on 05/03/21 at 1208  Med List Status: <None>   Medication Order Taking? Sig Documenting Provider Last Dose Status Informant  aspirin 81 MG EC tablet [id number] Yes Take 1 tablet (81 mg total) by mouth daily. Marnee Guarneri T, NP Taking Active   atorvastatin (LIPITOR) 80 MG tablet [id number] Yes TAKE 1 TABLET BY MOUTH ONCE DAILY Cannady, Jolene T, NP Taking Active   Blood Glucose Monitoring Suppl (ONE TOUCH ULTRA 2) w/Device KIT [id number] Yes Use to check blood sugar 4 times a day Marnee Guarneri T, NP Taking Active   BREO ELLIPTA 100-25 MCG/ACT AEPB [id number] Yes INHALE 1 PUFF BY MOUTH ONCE DAILY Cannady, Jolene T, NP Taking Active   Cholecalciferol 1.25 MG (50000 UT) TABS [id number] Yes Take 1 tablet by mouth once a week. Marnee Guarneri T, NP Taking Active   clobetasol cream (TEMOVATE) 0.05 % [id number] Yes Apply 1 application topically 2 (two) times daily. Apply to affected area Angiulli, Lavon Paganini, PA-C Taking Active   clopidogrel (PLAVIX) 75 MG tablet [id number] Yes Take 1 tablet by mouth daily. [provider] Taking Active   empagliflozin (JARDIANCE) 25 MG TABS tablet [id number] Yes Take 1 tablet (25 mg total) by mouth daily. Marnee Guarneri T, NP Taking Active   EPINEPHRINE 0.3 mg/0.3 mL IJ SOAJ injection [id number] Yes INJECT 1 SYRINGE INTO OUTER THIGH ONCE AS NEEDED FOR SEVERE ALLERGIC REACTION. Marnee Guarneri T, NP Taking Active   fenofibrate 54 MG tablet [id number] Yes TAKE 1 TABLET BY MOUTH ONCE DAILY Cannady, Jolene T, NP Taking Active   gabapentin (NEURONTIN) 300 MG capsule [id number] Yes TAKE 1 CAPSULE BY MOUTH TWICE DAILY Cannady, Jolene T, NP Taking Active   glucose blood (ONETOUCH ULTRA) test strip [id number]  Yes To check blood sugar three times daily Cannady, Jolene T, NP Taking Active   insulin degludec (TRESIBA FLEXTOUCH) 100 UNIT/ML FlexTouch Pen [id number] Yes Take 40 units into the skin in morning and 40 units into skin every evening. Marnee Guarneri T, NP Taking Active   insulin lispro (HUMALOG KWIKPEN) 100 UNIT/ML KwikPen [id number] Yes Inject 5 Units into the skin 3 (three) times daily. Prior to breakfast, lunch, and dinner. Marnee Guarneri T, NP Taking Active   Insulin Pen Needle (PEN NEEDLES) 32G X 4 MM MISC [id number] Yes 1 kit  by Does not apply route daily. [name], NP Taking Active   Lancets [facility] DELICA PLUS [id number]) MISC [id number] Yes USE AS DIRECTED. Marnee Guarneri T, NP Taking Active   lisinopril (ZESTRIL) 20 MG tablet [id number] Yes TAKE 1 TABLET BY MOUTH ONCE DAILY Cannady, Jolene T, NP Taking Active   Misc. Devices (PULSE OXIMETER FOR FINGER) MISC [id number] Yes To check O2 saturations once daily with asthma and document + check if any shortness of breath Cannady, Jolene T, NP Taking Active   montelukast (SINGULAIR) 10 MG tablet [id number] Yes TAKE 1 TABLET BY MOUTH AT BEDTIME Cannady, Jolene T, NP Taking Active   nitroGLYCERIN (NITROSTAT) 0.4 MG SL tablet [id number] Yes Place 1 tablet (0.4 mg total) under the tongue every 5 (five) minutes as needed for chest pain. Birdie Sons, MD Taking Active   omeprazole (PRILOSEC) 20 MG capsule [id number] Yes TAKE 1 CAPSULE BY MOUTH ONCE DAILY Cannady, Jolene T, NP Taking Active   tiZANidine (ZANAFLEX) 2 MG tablet [id number] Yes Take 1 tablet (2 mg total) by mouth every 6 (six) hours as needed for muscle spasms. Marnee Guarneri T, NP Taking Active   trimethoprim-polymyxin b (POLYTRIM) ophthalmic solution [id number] Yes Place 1 drop into the right eye every 6 (six) hours.  [provider] Taking Active   ULTRACARE PEN NEEDLES 32G X 5 MM MISC [id number] Yes  [provider] Taking Active   vitamin B-12 (CYANOCOBALAMIN) 1000  MCG tablet [id number] Yes Take 1 tablet (1,000 mcg total) by mouth daily. [name], NP Taking Active             Patient Active Problem List   Diagnosis Date Noted   B12 deficiency 04/26/2021   Heart murmur 09/21/2020   Carotid artery bruit 09/21/2020   Type 2 diabetes mellitus with proteinuria ([place]) 03/25/2019   Atherosclerosis of both carotid arteries 03/22/2019   History of CVA (cerebrovascular accident) 03/06/2019   Uncontrolled type 2 diabetes mellitus with hyperglycemia ([place])    Obesity 09/03/2018   Frequent falls 05/15/2016   Vitamin D deficiency 08/22/2015   Carpal tunnel syndrome on left 05/12/2015   External hemorrhoids 12/20/2014   Allergic rhinitis 10/21/2014   Diverticulosis 10/21/2014   Hypertension associated with diabetes ([place]) 10/21/2014   Hyperlipidemia associated with type 2 diabetes mellitus ([place]) 10/21/2014   Asthma 10/21/2014   GERD (gastroesophageal reflux disease) 10/21/2014   NAFLD (nonalcoholic fatty liver disease) 10/21/2014   Arthritis 10/21/2014   History of pancreatitis 10/07/2014   Phenylketonuria (PKU) ([place]) 10/07/2014   Renal cyst, left 10/07/2014   Gall bladder polyp 10/01/2013    Immunization History  Administered Date(s) Administered   Hepatitis A, Adult 09/26/2016, 05/22/2017   Hepatitis B, adult 09/26/2016, 05/22/2017, 11/29/2017   Influenza,inj,Quad PF,6+ Mos 12/22/2012, 12/21/2013, 11/29/2017, 12/02/2018, 01/18/2021   Influenza-Unspecified 11/30/2014, 11/18/2015   Moderna Sars-Covid-2 Vaccination 11/16/2019, 12/14/2019, 12/14/2019   Pneumococcal Polysaccharide-23 12/22/2012, 11/18/2015   Td 01/18/2021    Conditions to be addressed/monitored: CAD, HTN, HLD, DMII, Depression, and Asthma  There are no care plans that you recently modified to display for this patient.  Hyperlipidemia: (LDL goal < 70) -Uncontrolled -Current treatment: *** -Medications previously tried: ***  -Current dietary patterns: *** -Current  exercise habits: *** -Educated on {CCM HLD Counseling:25126} -{CCMPHARMDINTERVENTION:25122}  Diabetes (A1c goal <8%) -Uncontrolled -Hx of pancreatitis, most recent GFRs 50s, pcp visit 3/8 -NAFLD -Current medications: Tresiba 40 units BID Lispro 5 units TID  Jardiance 25 mg once daily -Medications previously tried: stopped metformin after GFR  decrease, glipizide (sulfa allergy) -Current home glucose readings Better diet, less soda, more water. Still reaching for chips. Likes greens. 153 fbg (140s-170s), 247 ppd (before 2 hours) -Denies hypoglycemic/hyperglycemic symptoms -Educated on A1c and blood sugar goals; Prevention and management of hypoglycemic episodes; Benefits of routine self-monitoring of blood sugar; -Counseled to check feet daily and get yearly eye exams -Recommended to continue current medication Receives extra help on Rx copays  Patient's preferred pharmacy is:  [facility], [address]. [place] [address] Phone: [phone] Fax: [phone]  Follow Up:  Patient agrees to Care Plan and Follow-up.  Plan: 3 month telephone f/u   Future Appointments  Date Time Provider [place]  05/24/2021  2:00 PM [name], NP CFP-CFP [facility]  06/14/2021 11:30 AM CFP CCM SOCIAL WORK CFP-CFP [facility]  06/14/2021  1:00 PM CFP CCM CASE MANAGER CFP-CFP PEC  06/26/2021  2:30 PM Schnier, Dolores Lory, MD AVVS-AVVS None   Madelin Rear, PharmD, [facility] Clinical Pharmacist  [facility]  [phone]

## 2021-05-16 DIAGNOSIS — E1169 Type 2 diabetes mellitus with other specified complication: Secondary | ICD-10-CM

## 2021-05-16 DIAGNOSIS — I152 Hypertension secondary to endocrine disorders: Secondary | ICD-10-CM

## 2021-05-16 DIAGNOSIS — E1159 Type 2 diabetes mellitus with other circulatory complications: Secondary | ICD-10-CM

## 2021-05-16 DIAGNOSIS — E1165 Type 2 diabetes mellitus with hyperglycemia: Secondary | ICD-10-CM

## 2021-05-16 DIAGNOSIS — E785 Hyperlipidemia, unspecified: Secondary | ICD-10-CM

## 2021-05-17 ENCOUNTER — Ambulatory Visit (INDEPENDENT_AMBULATORY_CARE_PROVIDER_SITE_OTHER): Payer: Medicare Other | Admitting: Licensed Clinical Social Worker

## 2021-05-17 DIAGNOSIS — E1159 Type 2 diabetes mellitus with other circulatory complications: Secondary | ICD-10-CM

## 2021-05-17 DIAGNOSIS — R296 Repeated falls: Secondary | ICD-10-CM

## 2021-05-17 DIAGNOSIS — E1165 Type 2 diabetes mellitus with hyperglycemia: Secondary | ICD-10-CM

## 2021-05-17 DIAGNOSIS — I152 Hypertension secondary to endocrine disorders: Secondary | ICD-10-CM

## 2021-05-19 NOTE — Patient Instructions (Signed)
Visit Information ? ?Thank you for taking time to visit with me today. Please don't hesitate to contact me if I can be of assistance to you before our next scheduled telephone appointment. ? ?Following are the goals we discussed today:  ?Patient Self Care Activities:  ?Attend all scheduled provider appointments ?Call provider office for new concerns or questions ?Continue to utilize healthy coping skills to assist in management of symptoms ? ?Our next appointment is by telephone on 06/14/21 at 11:30 AM ? ?Please call the care guide team at [phone] if you need to cancel or reschedule your appointment.  ? ?If you are experiencing a Mental Health or [place] or need someone to talk to, please call the Suicide and Crisis Lifeline: 988 ?call 911  ? ?The patient verbalized understanding of instructions, educational materials, and care plan provided today and declined offer to receive copy of patient instructions, educational materials, and care plan.  ? ?Christa See, MSW, LCSW ?[facility] [facility] ?[facility] ?[email or web address] ?Phone [phone] ?12:15 PM ?  ?

## 2021-05-19 NOTE — Chronic Care Management (AMB) (Signed)
Chronic Care Management    Clinical Social Work Note  05/19/2021 Name: [name]. MRN: [id number] DOB: 1960/09/24  [name]. is a 61 y.o. year old male who is a primary care patient of Cannady, Barbaraann Faster, NP. The CCM team was consulted to assist the patient with chronic disease management and/or care coordination needs related to: [name]  and [place] and Resources.   Engaged with patient's spouse by telephone for follow up visit in response to provider referral for social work chronic care management and care coordination services.   Consent to Services:  The patient was given information about Chronic Care Management services, agreed to services, and gave verbal consent prior to initiation of services.  Please see initial visit note for detailed documentation.   Patient agreed to services and consent obtained.   Assessment: Review of patient past medical history, allergies, medications, and health status, including review of relevant consultants reports was performed today as part of a comprehensive evaluation and provision of chronic care management and care coordination services.     SDOH (Social Determinants of Health) assessments and interventions performed:    Advanced Directives Status: Not addressed in this encounter.  CCM Care Plan  Allergies  Allergen Reactions   Bee Venom Hives    All kinds of bees   Other     Certain powders   Sulfa Antibiotics     Outpatient Encounter Medications as of 05/17/2021  Medication Sig   aspirin 81 MG EC tablet Take 1 tablet (81 mg total) by mouth daily.   atorvastatin (LIPITOR) 80 MG tablet TAKE 1 TABLET BY MOUTH ONCE DAILY   Blood Glucose Monitoring Suppl (ONE TOUCH ULTRA 2) w/Device KIT Use to check blood sugar 4 times a day   BREO ELLIPTA 100-25 MCG/ACT AEPB INHALE 1 PUFF BY MOUTH ONCE DAILY   Cholecalciferol 1.25 MG (50000 UT) TABS Take 1 tablet by mouth once a week.   clobetasol  cream (TEMOVATE) 5.18 % Apply 1 application topically 2 (two) times daily. Apply to affected area   clopidogrel (PLAVIX) 75 MG tablet Take 1 tablet by mouth daily.   empagliflozin (JARDIANCE) 25 MG TABS tablet Take 1 tablet (25 mg total) by mouth daily.   EPINEPHRINE 0.3 mg/0.3 mL IJ SOAJ injection INJECT 1 SYRINGE INTO OUTER THIGH ONCE AS NEEDED FOR SEVERE ALLERGIC REACTION.   fenofibrate 54 MG tablet TAKE 1 TABLET BY MOUTH ONCE DAILY   gabapentin (NEURONTIN) 300 MG capsule TAKE 1 CAPSULE BY MOUTH TWICE DAILY   glucose blood (ONETOUCH ULTRA) test strip USE TO CHECK BLOOD SUGAR 3 TIMES A DAY AS DIRECTED   insulin degludec (TRESIBA FLEXTOUCH) 100 UNIT/ML FlexTouch Pen Take 40 units into the skin in morning and 40 units into skin every evening.   insulin lispro (HUMALOG KWIKPEN) 100 UNIT/ML KwikPen Inject 5 Units into the skin 3 (three) times daily. Prior to breakfast, lunch, and dinner.   Insulin Pen Needle (PEN NEEDLES) 32G X 4 MM MISC 1 kit by Does not apply route daily.   Lancets (ONETOUCH DELICA PLUS [id number]) MISC USE AS DIRECTED.   lisinopril (ZESTRIL) 20 MG tablet TAKE 1 TABLET BY MOUTH ONCE DAILY   Misc. Devices (PULSE OXIMETER FOR FINGER) MISC To check O2 saturations once daily with asthma and document + check if any shortness of breath   montelukast (SINGULAIR) 10 MG tablet TAKE 1 TABLET BY MOUTH AT BEDTIME   nitroGLYCERIN (NITROSTAT) 0.4 MG SL tablet Place 1 tablet (  0.4 mg total) under the tongue every 5 (five) minutes as needed for chest pain.   omeprazole (PRILOSEC) 20 MG capsule TAKE 1 CAPSULE BY MOUTH ONCE DAILY   tiZANidine (ZANAFLEX) 2 MG tablet Take 1 tablet (2 mg total) by mouth every 6 (six) hours as needed for muscle spasms.   trimethoprim-polymyxin b (POLYTRIM) ophthalmic solution Place 1 drop into the right eye every 6 (six) hours.    ULTRACARE PEN NEEDLES 32G X 5 MM MISC    vitamin B-12 (CYANOCOBALAMIN) 1000 MCG tablet Take 1 tablet (1,000 mcg total) by mouth daily.   No  facility-administered encounter medications on file as of 05/17/2021.    Patient Active Problem List   Diagnosis Date Noted   B12 deficiency 04/26/2021   Heart murmur 09/21/2020   Carotid artery bruit 09/21/2020   Type 2 diabetes mellitus with proteinuria ([place]) 03/25/2019   Atherosclerosis of both carotid arteries 03/22/2019   History of CVA (cerebrovascular accident) 03/06/2019   Uncontrolled type 2 diabetes mellitus with hyperglycemia ([place])    Obesity 09/03/2018   Frequent falls 05/15/2016   Vitamin D deficiency 08/22/2015   Carpal tunnel syndrome on left 05/12/2015   External hemorrhoids 12/20/2014   Allergic rhinitis 10/21/2014   Diverticulosis 10/21/2014   Hypertension associated with diabetes ([place]) 10/21/2014   Hyperlipidemia associated with type 2 diabetes mellitus ([place]) 10/21/2014   Asthma 10/21/2014   GERD (gastroesophageal reflux disease) 10/21/2014   NAFLD (nonalcoholic fatty liver disease) 10/21/2014   Arthritis 10/21/2014   History of pancreatitis 10/07/2014   Phenylketonuria (PKU) ([place]) 10/07/2014   Renal cyst, left 10/07/2014   Gall bladder polyp 10/01/2013    Conditions to be addressed/monitored: HTN and DMII; Housing barriers  Care Plan : General Social Work (Adult)  Updates made by Rebekah Chesterfield, LCSW since 05/19/2021 12:00 AM     Problem: Coping Skills (General Plan of Care)      Long-Range Goal: Coping Skills Enhanced   Start Date: 05/16/2020  This Visit's Progress: On track  Recent Progress: On track  Priority: Medium  Note:   Current Barriers:  Financial constraints Limited social support Level of care concerns ADL IADL limitations Literacy concerns Cognitive Deficits Lacks knowledge of community resource: available financial support resources within the area as well as socialization opportunities within the area Grief support related to the recent loss of his mother Clinical Social Work Clinical Goal(s):  Over the next 120 days, client will  work with SW to address concerns related to improving self-care and implementing it whenever able Interventions: Patient's spouse was interviewed in regards to patient and appropriate assessments performed Patient has been doing well managing his blood sugars and been staying in a good mood. Per spouse, patient is often dancing and singing around the house. The family have began to decorate for the upcoming holidays 12/09: This is pt's first Christmas since the passing of his mother. Strategies to cope with grief during holidays 2/20: Spouse reports increase in stress triggered by decrease in SSI benefits (due to pt's increase) and lot rent increase. Family is providing emotional and financial support, to their best ability. Still receive SNAP benefits but scared that her medicaid will be terminated. CCM LCSW strongly encouraged [name] to contact her MA CM to discuss concerns 3/1: Spouse is upset about increase of living expenses and is unable to get in contact with landlords. States water bill will increase to $130 and rent has increased to $400 rent. Power bill is $300 Patient experiences anxiety regarding upcoming  procedure on knee scheduled next month. Concerned about limitations during the healing process (approx. 4-6 weeks) 09/30: Patient is healing from recent surgery. Spouse states he takes Tylenol extra strength and ibuprofen when he is feeling sore. Spouse cleanses his wound. Patient has a follow up appt with surgeon on Monday, October 3, ,22 to remove stiches Spouse reports strain paying the increase in rent, in addition, to medical bills. It's been a struggle. Per patient, charity care denied assistance until bills are $5000 12/09: Family receives support from family and friends. Utilizes local food pantries Patient's blood sugars have improved since he has been actively monitoring what he eats and drinks to promote health and well-being. His sleep has improved 12/09: Patient's wife shared they  continue to log blood sugars and check blood pressure Patient has been mowing the yard and using exercise bike to promote activity Patient utilizes pantries to obtain food, when needed 08/31: Family receives assistance from local pantries and rescue missions every month. They are considering applying for Food Stamps and are interested in Christmas [employer] assistance-LCSW agreed to look into this 09/30: Patient continues to mindfulness with diet to assist with management of diabetes. Family has applied for SNAP benefits to assist with food insecurity. CCM LCSW informed spouse of [facility] [employer] to ensure he participates in upcoming medical appointments. Spouse shared she will let CCM LCSW know if a referral is needed in the future Patient and spouse receives emotional support from family, who are looking into different options Strategies to assist with promoting mood and stress management was discussed. Family agreed to practice gratitude thinking on a routine basis 3/1: CCM LCSW discussed supportive resources, such as, Administrator, sports Resources/211 to provide additional support CCM LCSW reviewed upcoming appointments  Discussed plans with patient for ongoing care management follow up and provided patient with direct contact information for care management team Advised patient's spouse to contact CCM embedded practice providers and PCP if any urgent concerns arise. Brief self-care education provided to spouse during outreach today. Positive reinforcement provided for positive self-care implementation.  Patient Self Care Activities:  Attend all scheduled provider appointments Call provider office for new concerns or questions Continue to utilize healthy coping skills to assist in management of symptoms       Christa See, MSW, [email or web address] Phone [phone] 12:14 PM

## 2021-05-21 NOTE — Patient Instructions (Signed)

## 2021-05-22 ENCOUNTER — Ambulatory Visit: Payer: Medicare Other

## 2021-05-22 DIAGNOSIS — E1169 Type 2 diabetes mellitus with other specified complication: Secondary | ICD-10-CM

## 2021-05-22 DIAGNOSIS — E1165 Type 2 diabetes mellitus with hyperglycemia: Secondary | ICD-10-CM

## 2021-05-24 ENCOUNTER — Encounter: Payer: Self-pay | Admitting: Nurse Practitioner

## 2021-05-24 ENCOUNTER — Ambulatory Visit (INDEPENDENT_AMBULATORY_CARE_PROVIDER_SITE_OTHER): Payer: Medicare Other | Admitting: Nurse Practitioner

## 2021-05-24 ENCOUNTER — Other Ambulatory Visit: Payer: Self-pay

## 2021-05-24 VITALS — BP 118/72 | HR 56 | Temp 98.3°F | Ht 62.2 in | Wt 176.2 lb

## 2021-05-24 DIAGNOSIS — R809 Proteinuria, unspecified: Secondary | ICD-10-CM

## 2021-05-24 DIAGNOSIS — E1129 Type 2 diabetes mellitus with other diabetic kidney complication: Secondary | ICD-10-CM

## 2021-05-24 DIAGNOSIS — E1165 Type 2 diabetes mellitus with hyperglycemia: Secondary | ICD-10-CM

## 2021-05-24 DIAGNOSIS — M25551 Pain in right hip: Secondary | ICD-10-CM | POA: Diagnosis not present

## 2021-05-24 NOTE — Assessment & Plan Note (Signed)
Chronic, ongoing.  A1C upward trend last visit at 10.2%, previous 8.7%.  Urine ALB 28 April 2021. Has poor diet at home, although reports improving. Missed endo visit in September for initial check, new referral placed last visit and they have not heard from them.   ?- Continue Jardiance and Tresiba to 40 units BID + Humalog 5 units before meals == improving sugars at home at this time. ?- Poor tolerance to Metformin in past and allergic to Sulfa.  Would avoid GLP at this time due to patient history of pancreatitis, concern this would flare.  Educated patient at length on effect of diabetes from head to toe and increased risk for recurrent CVA due to poor control.   ?- Recommend they check his BS TID -- he would benefit from [facility] will work on this with CCM team in future.  Goal A1C <6.5% due to CVA history -- which reiterated at length with patient today.  Continue collaboration with CCM team.  Return in 2 months. ?

## 2021-05-24 NOTE — Assessment & Plan Note (Signed)
Acute, for weeks.  Some discomfort with ROM today.  Will obtain imaging, suspect some arthritic changes.  Placed PT referral per patient request.  Recommend simple treatment, such as Tylenol and Icy/Hot, at home.  If worsening return to office. ?

## 2021-05-24 NOTE — Assessment & Plan Note (Signed)
Refer to Type 2 Diabetes with proteinuria plan. ?

## 2021-05-24 NOTE — Progress Notes (Signed)
? ?BP 118/72   Pulse (!) 56   Temp 98.3 ?F (36.8 ?C) (Oral)   Ht 5' 2.2" (1.58 m)   Wt 176 lb 3.2 oz (79.9 kg)   SpO2 97%   BMI 32.02 kg/m?   ? ?Subjective:  ? ? Patient ID: Alex Metro., male    DOB: 17-Feb-1961, 61 y.o.   MRN: [id number] ? ?HPI: ?Alex Simenson. is a 61 y.o. male ? ?Chief Complaint  ?Patient presents with  ? Diabetes  ?  Patient states his readings has been better since his last visit.   ? ?DIABETES ?Last visit A1c in February was 10.2% -- 04/26/21.  Continues Jardiance and Tresiba 40 units BID and started Humalog 5 units before meals.  [name] is working on diet changes with his wife.  Has not heard from endocrinology -- referral placed last visit.   ? ?[name] reports [name] is still having issues with weakness and pain to right hip == would like PT again at hospital, which [name] benefited from last time.   ?Hypoglycemic episodes:no ?Polydipsia/polyuria: no ?Visual disturbance: no ?Chest pain: no ?Paresthesias: no ?Glucose Monitoring: yes ? Accucheck frequency: TID ? Fasting glucose: 104 to 145 ? Post prandial: 135 to 274 ? Evening: 117 to 204 ? Before meals: ?Taking Insulin?: yes ? Long acting insulin: Tresiba 40 units BID ? Short acting insulin: Humalog 5 units before meals ?Blood Pressure Monitoring: not checking ?Retinal Examination: Up to Date ?Foot Exam: Up to Date ?Diabetic Education: Not Completed ?Pneumovax: Up to Date ?Influenza: Up to Date ?Aspirin: yes  ? ?Relevant past medical, surgical, family and social history reviewed and updated as indicated. Interim medical history since our last visit reviewed. ?Allergies and medications reviewed and updated. ? ?Review of Systems  ?Constitutional:  Negative for activity change, diaphoresis, fatigue and fever.  ?Respiratory:  Negative for cough, chest tightness, shortness of breath and wheezing.   ?Cardiovascular:  Negative for chest pain, palpitations and leg swelling.  ?Gastrointestinal: Negative.   ?Endocrine: Negative for cold  intolerance, heat intolerance, polydipsia, polyphagia and polyuria.  ?Musculoskeletal:  Positive for arthralgias.  ?Neurological: Negative.   ?Psychiatric/Behavioral: Negative.    ? ?Per HPI unless specifically indicated above ? ?   ?Objective:  ?  ?BP 118/72   Pulse (!) 56   Temp 98.3 ?F (36.8 ?C) (Oral)   Ht 5' 2.2" (1.58 m)   Wt 176 lb 3.2 oz (79.9 kg)   SpO2 97%   BMI 32.02 kg/m?   ?Wt Readings from Last 3 Encounters:  ?05/24/21 176 lb 3.2 oz (79.9 kg)  ?04/26/21 181 lb 3.2 oz (82.2 kg)  ?01/18/21 179 lb 3.2 oz (81.3 kg)  ?  ?Physical Exam ?Vitals and nursing note reviewed.  ?Constitutional:   ?   General: [name] is awake. [name] is not in acute distress. ?   Appearance: [name] is well-developed. [name] is obese. [name] is not ill-appearing.  ?HENT:  ?   Head: Normocephalic and atraumatic.  ?   Right Ear: Hearing normal. No drainage.  ?   Left Ear: Hearing normal. No drainage.  ?Eyes:  ?   General: Lids are normal.     ?   Right eye: No discharge.     ?   Left eye: No discharge.  ?   Conjunctiva/sclera: Conjunctivae normal.  ?   Pupils: Pupils are equal, round, and reactive to light.  ?Neck:  ?   Thyroid: No thyromegaly.  ?   Vascular: Carotid bruit (R>L) present.  ?  Cardiovascular:  ?   Rate and Rhythm: Normal rate and regular rhythm.  ?   Heart sounds: S1 normal and S2 normal. Murmur heard.  ?Systolic murmur is present with a grade of 2/6.  ?  No gallop.  ?   Comments: Systolic murmur noted best at left sternal border, soft. ?Pulmonary:  ?   Effort: Pulmonary effort is normal. No accessory muscle usage or respiratory distress.  ?   Breath sounds: Normal breath sounds.  ?Abdominal:  ?   General: Bowel sounds are normal.  ?   Palpations: Abdomen is soft.  ?Musculoskeletal:     ?   General: Normal range of motion.  ?   Cervical back: Normal range of motion and neck supple.  ?   Right hip: Tenderness (lateral aspect) and crepitus present. No deformity or bony tenderness. Normal range of motion.  ?   Left hip: Normal.  ?   Right  lower leg: No edema.  ?   Left lower leg: No edema.  ?Skin: ?   General: Skin is warm and dry.  ?   Capillary Refill: Capillary refill takes less than 2 seconds.  ?Neurological:  ?   Mental Status: [name] is alert and oriented to person, place, and time.  ?   Deep Tendon Reflexes: Reflexes are normal and symmetric.  ?Psychiatric:     ?   Attention and Perception: Attention normal.     ?   Mood and Affect: Mood normal.     ?   Speech: Speech normal.     ?   Behavior: Behavior normal. Behavior is cooperative.  ? ? ?Results for orders placed or performed in visit on 05/01/21  ?HM DIABETES EYE EXAM  ?Result Value Ref Range  ? HM Diabetic Eye Exam Retinopathy (A) No Retinopathy  ? ?   ?Assessment & Plan:  ? ?Problem List Items Addressed This Visit   ? ?  ? Endocrine  ? 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  Chronic, ongoing.  A1C upward trend last visit at 10.2%, previous 8.7%.  Urine ALB 28 April 2021. Has poor diet at home, although reports improving. Missed endo visit in September for initial check, new referral placed last visit and they have not heard from them.   ?- Continue Jardiance and Tresiba to 40 units BID + Humalog 5 units before meals == improving sugars at home at this time. ?- Poor tolerance to Metformin in past and allergic to Sulfa.  Would avoid GLP at this time due to patient history of pancreatitis, concern this would flare.  Educated patient at length on effect of diabetes from head to toe and increased risk for recurrent CVA due to poor control.   ?- Recommend they check his BS TID -- [name] would benefit from [facility] will work on this with CCM team in future.  Goal A1C <6.5% due to CVA history -- which reiterated at length with patient today.  Continue collaboration with CCM team.  Return in 2 months. ?  ?  ? Uncontrolled type 2 diabetes mellitus with hyperglycemia ([place])  ?  Refer to Type 2 Diabetes with proteinuria plan. ?  ?  ?  ? Other  ? Right hip pain  ?  Acute, for  weeks.  Some discomfort with ROM today.  Will obtain imaging, suspect some arthritic changes.  Placed PT referral per patient request.  Recommend simple treatment, such as Tylenol and Icy/Hot, at home.  If worsening  return to office. ?  ?  ? Relevant Orders  ? Ambulatory referral to Physical Therapy  ? DG Hip Unilat W OR W/O Pelvis 2-3 Views Right  ?  ? ?Follow up plan: ?Return in about 2 months (around 07/24/2021) for T2DM, HTN/HLD, GERD, B12, Vit D. ? ? ? ? ? ?

## 2021-05-25 DIAGNOSIS — E119 Type 2 diabetes mellitus without complications: Secondary | ICD-10-CM | POA: Diagnosis not present

## 2021-05-25 LAB — HM DIABETES EYE EXAM

## 2021-05-30 DIAGNOSIS — H2512 Age-related nuclear cataract, left eye: Secondary | ICD-10-CM | POA: Diagnosis not present

## 2021-05-30 NOTE — Patient Instructions (Signed)
[name], ? ?Thank you for talking with me today. I have included our care plan/goals in the following pages.  ? ?Please review and call me at [phone] with any questions. ? ?Thanks! ?[name]  ? ?Madelin Rear, PharmD ?Clinical Pharmacist  ?[phone] ? ?Care Plan : [name]  ?Updates made by Madelin Rear, [facility] since 05/30/2021 12:00 AM  ?  ? ?Problem: Diabetes, HTN,HLD, Asthma, GERD, NAFLD,   ?Priority: High  ?  ? ?Long-Range Goal: Disease Management   ?Start Date: 05/22/2021  ?Recent Progress: Not on track  ?Priority: High  ?Note:   ?Hyperlipidemia: (LDL goal < 70) ?-Uncontrolled ?-intermediate 10-yr ASCVD risk 15% ?-Current treatment: ?Fenofibrate 54 mg  ?Atorvastatin 80 mg  ?-reviewed for intolerance/side effects - no problems noted    ?-Educated on Cholesterol goals;  ?Benefits of statin for ASCVD risk reduction; ?-Counseled on diet and exercise extensively ?Considerations: TG lowering-might be a better candidate for icosapent ethyl per REDUCE-IT trial versus fibrate; additional LDL-lowering- addition of zetia to achieve LDL <70 + 50% reduction from baseline  ? ?Diabetes (A1c goal <8%) ?-Uncontrolled ?-Hx of pancreatitis, most recent GFRs 33s, pcp visit 3/8 ?-NAFLD, persistent, TG elevation ?-Current medications: ?Tresiba 40 units BID ?Humalog 5 units before meals  ?Lispro 5 units TID  ?Jardiance 25 mg once daily ?-Medications previously tried: stopped metformin after GFR decrease, glipizide (sulfa allergy) ?-Current home glucose readings ?Better diet, less soda, more water. Still reaching for chips. Likes greens. ?153 fbg (140s-170s), 247 ppd (before 2 hours) ?-Denies hypoglycemic/hyperglycemic symptoms ?-Educated on A1c and blood sugar goals; ?Prevention and management of hypoglycemic episodes; ?Benefits of routine self-monitoring of blood sugar; ?-Recommended to continue current medication ?Receives extra help on Rx copays - no current issues  ?  ? ?Telephone follow up appointment with  pharmacy team member scheduled for: See next appointment with "Care Management Staff" under "What's Next" below.  Diabetes Mellitus and Nutrition, Adult ?When you have diabetes, or diabetes mellitus, it is very important to have healthy eating habits because your blood sugar (glucose) levels are greatly affected by what you eat and drink. Eating healthy foods in the right amounts, at about the same times every day, can help you: ?Manage your blood glucose. ?Lower your risk of heart disease. ?Improve your blood pressure. ?Reach or maintain a healthy weight. ?What can affect my meal plan? ?Every person with diabetes is different, and each person has different needs for a meal plan. Your health care provider may recommend that you work with a dietitian to make a meal plan that is best for you. Your meal plan may vary depending on factors such as: ?The calories you need. ?The medicines you take. ?Your weight. ?Your blood glucose, blood pressure, and cholesterol levels. ?Your activity level. ?Other health conditions you have, such as heart or kidney disease. ?How do carbohydrates affect me? ?Carbohydrates, also called carbs, affect your blood glucose level more than any other type of food. Eating carbs raises the amount of glucose in your blood. ?It is important to know how many carbs you can safely have in each meal. This is different for every person. Your dietitian can help you calculate how many carbs you should have at each meal and for each snack. ?How does alcohol affect me? ?Alcohol can cause a decrease in blood glucose (hypoglycemia), especially if you use insulin or take certain diabetes medicines by mouth. Hypoglycemia can be a life-threatening condition. Symptoms of hypoglycemia, such as sleepiness, dizziness, and confusion, are similar  to symptoms of having too much alcohol. ?Do not drink alcohol if: ?Your health care provider tells you not to drink. ?You are pregnant, may be pregnant, or are planning to  become pregnant. ?If you drink alcohol: ?Limit how much you have to: ?0-1 drink a day for women. ?0-2 drinks a day for men. ?Know how much alcohol is in your drink. In the [place] one drink equals one 12 oz bottle of beer (355 mL), one 5 oz glass of wine (148 mL), or one 1? oz glass of hard liquor (44 mL). ?Keep yourself hydrated with water, diet soda, or unsweetened iced tea. Keep in mind that regular soda, juice, and other mixers may contain a lot of sugar and must be counted as carbs. ?What are tips for following this plan? ?Reading food labels ?Start by checking the serving size on the Nutrition Facts label of packaged foods and drinks. The number of calories and the amount of carbs, fats, and other nutrients listed on the label are based on one serving of the item. Many items contain more than one serving per package. ?Check the total grams (g) of carbs in one serving. ?Check the number of grams of saturated fats and trans fats in one serving. Choose foods that have a low amount or none of these fats. ?Check the number of milligrams (mg) of salt (sodium) in one serving. Most people should limit total sodium intake to less than 2,300 mg per day. ?Always check the nutrition information of foods labeled as "low-fat" or "nonfat." These foods may be higher in added sugar or refined carbs and should be avoided. ?Talk to your dietitian to identify your daily goals for nutrients listed on the label. ?Shopping ?Avoid buying canned, pre-made, or processed foods. These foods tend to be high in fat, sodium, and added sugar. ?Shop around the outside edge of the grocery store. This is where you will most often find fresh fruits and vegetables, bulk grains, fresh meats, and fresh dairy products. ?Cooking ?Use low-heat cooking methods, such as baking, instead of high-heat cooking methods, such as deep frying. ?Cook using healthy oils, such as olive, canola, or sunflower oil. ?Avoid cooking with butter, cream, or high-fat  meats. ?Meal planning ?Eat meals and snacks regularly, preferably at the same times every day. Avoid going long periods of time without eating. ?Eat foods that are high in fiber, such as fresh fruits, vegetables, beans, and whole grains. ?Eat 4-6 oz (112-168 g) of lean protein each day, such as lean meat, chicken, fish, eggs, or tofu. One ounce (oz) (28 g) of lean protein is equal to: ?1 oz (28 g) of meat, chicken, or fish. ?1 egg. ?? cup (62 g) of tofu. ?Eat some foods each day that contain healthy fats, such as avocado, nuts, seeds, and fish. ?What foods should I eat? ?Fruits ?Berries. Apples. Oranges. Peaches. Apricots. Plums. Grapes. Mangoes. Papayas. Pomegranates. Kiwi. Cherries. ?Vegetables ?Leafy greens, including lettuce, spinach, kale, chard, collard greens, mustard greens, and cabbage. Beets. Cauliflower. Broccoli. Carrots. Green beans. Tomatoes. Peppers. Onions. Cucumbers. Brussels sprouts. ?Grains ?Whole grains, such as whole-wheat or whole-grain bread, crackers, tortillas, cereal, and pasta. Unsweetened oatmeal. Quinoa. Brown or wild rice. ?Meats and other proteins ?Seafood. Poultry without skin. Lean cuts of poultry and beef. Tofu. Nuts. Seeds. ?Dairy ?Low-fat or fat-free dairy products such as milk, yogurt, and cheese. ?The items listed above may not be a complete list of foods and beverages you can eat and drink. Contact a dietitian for more information. ?What  foods should I avoid? ?Fruits ?Fruits canned with syrup. ?Vegetables ?Canned vegetables. Frozen vegetables with butter or cream sauce. ?Grains ?Refined white flour and flour products such as bread, pasta, snack foods, and cereals. Avoid all processed foods. ?Meats and other proteins ?Fatty cuts of meat. Poultry with skin. Breaded or fried meats. Processed meat. Avoid saturated fats. ?Dairy ?Full-fat yogurt, cheese, or milk. ?Beverages ?Sweetened drinks, such as soda or iced tea. ?The items listed above may not be a complete list of foods and  beverages you should avoid. Contact a dietitian for more information. ?Questions to ask a health care provider ?Do I need to meet with a certified diabetes care and education specialist? ?Do I need to meet with a

## 2021-05-31 ENCOUNTER — Encounter: Payer: Self-pay | Admitting: Ophthalmology

## 2021-05-31 NOTE — Discharge Instructions (Signed)

## 2021-06-01 DIAGNOSIS — R079 Chest pain, unspecified: Secondary | ICD-10-CM | POA: Diagnosis not present

## 2021-06-01 DIAGNOSIS — E782 Mixed hyperlipidemia: Secondary | ICD-10-CM | POA: Diagnosis not present

## 2021-06-01 DIAGNOSIS — I10 Essential (primary) hypertension: Secondary | ICD-10-CM | POA: Diagnosis not present

## 2021-06-01 DIAGNOSIS — E119 Type 2 diabetes mellitus without complications: Secondary | ICD-10-CM | POA: Diagnosis not present

## 2021-06-01 DIAGNOSIS — R0602 Shortness of breath: Secondary | ICD-10-CM | POA: Diagnosis not present

## 2021-06-01 DIAGNOSIS — I351 Nonrheumatic aortic (valve) insufficiency: Secondary | ICD-10-CM | POA: Diagnosis not present

## 2021-06-01 DIAGNOSIS — I340 Nonrheumatic mitral (valve) insufficiency: Secondary | ICD-10-CM | POA: Diagnosis not present

## 2021-06-01 DIAGNOSIS — I6389 Other cerebral infarction: Secondary | ICD-10-CM | POA: Diagnosis not present

## 2021-06-05 ENCOUNTER — Ambulatory Visit: Payer: Medicare Other | Admitting: Anesthesiology

## 2021-06-05 ENCOUNTER — Ambulatory Visit
Admission: RE | Admit: 2021-06-05 | Discharge: 2021-06-05 | Disposition: A | Discharge status: Home / Self Care | Payer: Medicare Other | Attending: Ophthalmology | Admitting: Ophthalmology

## 2021-06-05 ENCOUNTER — Encounter
Admission: RE | Disposition: A | Discharge status: Home / Self Care | Payer: Self-pay | Source: Home / Self Care | Attending: Ophthalmology

## 2021-06-05 ENCOUNTER — Encounter: Payer: Self-pay | Admitting: Ophthalmology

## 2021-06-05 ENCOUNTER — Other Ambulatory Visit: Payer: Self-pay

## 2021-06-05 DIAGNOSIS — K219 Gastro-esophageal reflux disease without esophagitis: Secondary | ICD-10-CM | POA: Insufficient documentation

## 2021-06-05 DIAGNOSIS — E1165 Type 2 diabetes mellitus with hyperglycemia: Secondary | ICD-10-CM | POA: Diagnosis not present

## 2021-06-05 DIAGNOSIS — I69354 Hemiplegia and hemiparesis following cerebral infarction affecting left non-dominant side: Secondary | ICD-10-CM | POA: Insufficient documentation

## 2021-06-05 DIAGNOSIS — H2512 Age-related nuclear cataract, left eye: Secondary | ICD-10-CM | POA: Diagnosis not present

## 2021-06-05 DIAGNOSIS — E1121 Type 2 diabetes mellitus with diabetic nephropathy: Secondary | ICD-10-CM | POA: Diagnosis not present

## 2021-06-05 DIAGNOSIS — J45909 Unspecified asthma, uncomplicated: Secondary | ICD-10-CM | POA: Insufficient documentation

## 2021-06-05 DIAGNOSIS — E1136 Type 2 diabetes mellitus with diabetic cataract: Secondary | ICD-10-CM | POA: Insufficient documentation

## 2021-06-05 DIAGNOSIS — I10 Essential (primary) hypertension: Secondary | ICD-10-CM | POA: Diagnosis not present

## 2021-06-05 DIAGNOSIS — H25812 Combined forms of age-related cataract, left eye: Secondary | ICD-10-CM | POA: Diagnosis not present

## 2021-06-05 HISTORY — DX: Complete loss of teeth, unspecified cause, unspecified class: K08.109

## 2021-06-05 HISTORY — PX: CATARACT EXTRACTION W/PHACO: SHX586

## 2021-06-05 LAB — GLUCOSE, CAPILLARY
Glucose-Capillary: 242 mg/dL — ABNORMAL HIGH (ref 70–99)
Glucose-Capillary: 249 mg/dL — ABNORMAL HIGH (ref 70–99)

## 2021-06-05 SURGERY — PHACOEMULSIFICATION, CATARACT, WITH IOL INSERTION
Anesthesia: Monitor Anesthesia Care | Site: Eye | Laterality: Left

## 2021-06-05 MED ORDER — DEXMEDETOMIDINE (PRECEDEX) IN NS 20 MCG/5ML (4 MCG/ML) IV SYRINGE
PREFILLED_SYRINGE | INTRAVENOUS | Status: DC | PRN
  Administered 2021-06-05: 10 ug via INTRAVENOUS

## 2021-06-05 MED ORDER — MIDAZOLAM HCL 2 MG/2ML IJ SOLN
INTRAMUSCULAR | Status: DC | PRN
Start: 2021-06-05 — End: 2021-06-05
  Administered 2021-06-05: 2 mg via INTRAVENOUS

## 2021-06-05 MED ORDER — TETRACAINE HCL 0.5 % OP SOLN
1.0000 [drp] | OPHTHALMIC | Status: DC | PRN
  Administered 2021-06-05 (×3): 1 [drp] via OPHTHALMIC

## 2021-06-05 MED ORDER — SIGHTPATH DOSE#1 BSS IO SOLN
INTRAOCULAR | Status: DC | PRN
  Administered 2021-06-05: 15 mL

## 2021-06-05 MED ORDER — SIGHTPATH DOSE#1 SODIUM HYALURONATE 10 MG/ML IO SOLUTION
PREFILLED_SYRINGE | INTRAOCULAR | Status: DC | PRN
  Administered 2021-06-05: 0.85 mL via INTRAOCULAR

## 2021-06-05 MED ORDER — SIGHTPATH DOSE#1 BSS IO SOLN
INTRAOCULAR | Status: DC | PRN
  Administered 2021-06-05: 60 mL via OPHTHALMIC

## 2021-06-05 MED ORDER — ARMC OPHTHALMIC DILATING DROPS
1.0000 "application " | OPHTHALMIC | Status: DC | PRN
  Administered 2021-06-05 (×3): 1 via OPHTHALMIC

## 2021-06-05 MED ORDER — LACTATED RINGERS IV SOLN: INTRAVENOUS | Status: DC

## 2021-06-05 MED ORDER — FENTANYL CITRATE (PF) 100 MCG/2ML IJ SOLN
INTRAMUSCULAR | Status: DC | PRN
  Administered 2021-06-05 (×2): 50 ug via INTRAVENOUS

## 2021-06-05 MED ORDER — MOXIFLOXACIN HCL 0.5 % OP SOLN
OPHTHALMIC | Status: DC | PRN
  Administered 2021-06-05: 0.2 mL via OPHTHALMIC

## 2021-06-05 MED ORDER — SIGHTPATH DOSE#1 SODIUM HYALURONATE 23 MG/ML IO SOLUTION
PREFILLED_SYRINGE | INTRAOCULAR | Status: DC | PRN
  Administered 2021-06-05: 0.6 mL via INTRAOCULAR

## 2021-06-05 MED ORDER — BALANCED SALT IO SOLN
INTRAOCULAR | Status: DC | PRN
  Administered 2021-06-05: 1 mL via INTRAOCULAR

## 2021-06-05 SURGICAL SUPPLY — 14 items
CATARACT SUITE SIGHTPATH (MISCELLANEOUS) ×2 IMPLANT
DISSECTOR HYDRO NUCLEUS 50X22 (MISCELLANEOUS) ×2 IMPLANT
FEE CATARACT SUITE SIGHTPATH (MISCELLANEOUS) ×1 IMPLANT
GLOVE SURG GAMMEX PI TX LF 7.5 (GLOVE) ×2 IMPLANT
GLOVE SURG SYN 8.5  E (GLOVE) ×1
GLOVE SURG SYN 8.5 E (GLOVE) ×1 IMPLANT
GLOVE SURG SYN 8.5 PF PI (GLOVE) ×1 IMPLANT
LENS IOL TECNIS EYHANCE 22.0 (Intraocular Lens) ×1 IMPLANT
NDL FILTER BLUNT 18X1 1/2 (NEEDLE) ×1 IMPLANT
NEEDLE FILTER BLUNT 18X 1/2SAF (NEEDLE) ×1
NEEDLE FILTER BLUNT 18X1 1/2 (NEEDLE) ×1 IMPLANT
SYR 3ML LL SCALE MARK (SYRINGE) ×2 IMPLANT
SYR 5ML LL (SYRINGE) ×2 IMPLANT
WATER STERILE IRR 250ML POUR (IV SOLUTION) ×2 IMPLANT

## 2021-06-05 NOTE — Transfer of Care (Signed)
Immediate Anesthesia Transfer of Care Note ? ?Patient: [name]. ? ?Procedure(s) Performed: CATARACT EXTRACTION PHACO AND INTRAOCULAR LENS PLACEMENT (IOC) LEFT DIABETIC 3.75 00:28.0 (Left: Eye) ? ?Patient Location: PACU ? ?Anesthesia Type: MAC ? ?Level of Consciousness: awake, alert  and patient cooperative ? ?Airway and Oxygen Therapy: Patient Spontanous Breathing and Patient connected to supplemental oxygen ? ?Post-op Assessment: Post-op Vital signs reviewed, Patient's Cardiovascular Status Stable, Respiratory Function Stable, Patent Airway and No signs of Nausea or vomiting ? ?Post-op Vital Signs: Reviewed and stable ? ?Complications: No notable events documented. ? ?

## 2021-06-05 NOTE — H&P (Signed)
[name]  ? ?Primary Care Physician:  [name], NP ?Ophthalmologist: Dr. Benay Pillow ? ?Pre-Procedure History & Physical: ?HPI:  [name]. is a 61 y.o. male here for cataract surgery. ?  ?Past Medical History:  ?Diagnosis Date  ? Arrhythmia   ? Asthma   ? Cyst of kidney, acquired   ? Diabetes mellitus without complication ([place]) 9211  ? type 2  ? Edentulous   ? Fatty liver   ? GERD (gastroesophageal reflux disease)   ? History of chicken pox   ? History of measles as a child   ? History of PKU   ? Hyperlipidemia   ? Hypertension   ? IBS (irritable bowel syndrome)   ? Irregular heart beat   ? Mentally challenged   ? Pancreatitis   ? Stroke ([place]) 03/02/2019  ? vision issues - right eye  ? ? ?Past Surgical History:  ?Procedure Laterality Date  ? Cardiac Catherization    ? [place]  ? CARDIAC CATHETERIZATION    ? [place]  ? COLONOSCOPY    ? COLONOSCOPY WITH PROPOFOL N/A 08/10/2020  ? Procedure: COLONOSCOPY WITH PROPOFOL;  Surgeon: Lin Landsman, MD;  Location: [facility] ENDOSCOPY;  Service: Gastroenterology;  Laterality: N/A;  Has ankle monitor; (Parole officer "Ms Andree Moro needs a time before Monday [phone]" - per patient's wife)  ? ESOPHAGOGASTRODUODENOSCOPY (EGD) WITH PROPOFOL N/A 12/27/2016  ? Procedure: ESOPHAGOGASTRODUODENOSCOPY (EGD) WITH PROPOFOL;  Surgeon: Lin Landsman, MD;  Location: [facility] ENDOSCOPY;  Service: Gastroenterology;  Laterality: N/A;  ? HEMORRHOID SURGERY    ? Ligament Removal Left   ? of left thumb: dr. Cleda Mccreedy  ? ligament removal  of left thumb    ? Dr. Cleda Mccreedy  ? [place] GATED MYOCARDIAL STUDY (ARMX HX)  06/23/2014  ? [place]. Normal  ? ? ?Prior to Admission medications   ?Medication Sig Start Date End Date Taking? Authorizing Provider  ?aspirin 81 MG EC tablet Take 1 tablet (81 mg total) by mouth daily. 03/25/19  Yes Cannady, Jolene T, NP  ?atorvastatin (LIPITOR) 80 MG tablet TAKE 1 TABLET BY MOUTH ONCE DAILY 11/18/20  Yes Cannady, Jolene T, NP  ?BREO ELLIPTA 100-25  MCG/ACT AEPB INHALE 1 PUFF BY MOUTH ONCE DAILY 02/24/21  Yes [name], NP  ?Cholecalciferol 1.25 MG (50000 UT) TABS Take 1 tablet by mouth once a week. 04/27/21  Yes Cannady, Jolene T, NP  ?clobetasol cream (TEMOVATE) 9.41 % Apply 1 application topically 2 (two) times daily. Apply to affected area 03/11/19  Yes Angiulli, Lavon Paganini, PA-C  ?clopidogrel (PLAVIX) 75 MG tablet Take 1 tablet by mouth daily. 12/25/19  Yes [provider]  ?empagliflozin (JARDIANCE) 25 MG TABS tablet Take 1 tablet (25 mg total) by mouth daily. 04/27/21  Yes Cannady, Jolene T, NP  ?EPINEPHRINE 0.3 mg/0.3 mL IJ SOAJ injection INJECT 1 SYRINGE INTO OUTER THIGH ONCE AS NEEDED FOR SEVERE ALLERGIC REACTION. 01/29/21  Yes Cannady, Jolene T, NP  ?fenofibrate 54 MG tablet TAKE 1 TABLET BY MOUTH ONCE DAILY 03/28/21  Yes Cannady, Jolene T, NP  ?gabapentin (NEURONTIN) 300 MG capsule TAKE 1 CAPSULE BY MOUTH TWICE DAILY 03/28/21  Yes Cannady, Jolene T, NP  ?glucose blood (ONETOUCH ULTRA) test strip USE TO CHECK BLOOD SUGAR 3 TIMES A DAY AS DIRECTED 05/10/21  Yes Cannady, Jolene T, NP  ?insulin degludec (TRESIBA FLEXTOUCH) 100 UNIT/ML FlexTouch Pen Take 40 units into the skin in morning and 40 units into skin every evening. 04/26/21  Yes Cannady, Jolene  T, NP  ?insulin lispro (HUMALOG KWIKPEN) 100 UNIT/ML KwikPen Inject 5 Units into the skin 3 (three) times daily. Prior to breakfast, lunch, and dinner. 04/26/21  Yes Cannady, Jolene T, NP  ?lisinopril (ZESTRIL) 20 MG tablet TAKE 1 TABLET BY MOUTH ONCE DAILY 03/28/21  Yes Cannady, Jolene T, NP  ?montelukast (SINGULAIR) 10 MG tablet TAKE 1 TABLET BY MOUTH AT BEDTIME 02/24/21  Yes Cannady, Jolene T, NP  ?nitroGLYCERIN (NITROSTAT) 0.4 MG SL tablet Place 1 tablet (0.4 mg total) under the tongue every 5 (five) minutes as needed for chest pain. 08/27/17  Yes Birdie Sons, MD  ?omeprazole (PRILOSEC) 20 MG capsule TAKE 1 CAPSULE BY MOUTH ONCE DAILY 03/28/21  Yes Cannady, Jolene T, NP  ?tiZANidine (ZANAFLEX) 2  MG tablet Take 1 tablet (2 mg total) by mouth every 6 (six) hours as needed for muscle spasms. 04/28/21  Yes Cannady, Henrine Screws T, NP  ?trimethoprim-polymyxin b (POLYTRIM) ophthalmic solution Place 1 drop into the right eye every 6 (six) hours.  07/07/19  Yes [provider]  ?Blood Glucose Monitoring Suppl (ONE TOUCH ULTRA 2) w/Device KIT Use to check blood sugar 4 times a day 03/25/20   [name], NP  ?Insulin Pen Needle (PEN NEEDLES) 32G X 4 MM MISC 1 kit by Does not apply route daily. 09/07/19   [name], NP  ?Lancets (ONETOUCH DELICA PLUS EGBTDV76H) MISC USE AS DIRECTED. 01/29/21   [name], NP  ?Misc. Devices (PULSE OXIMETER FOR FINGER) MISC To check O2 saturations once daily with asthma and document + check if any shortness of breath 06/01/20   Marnee Guarneri T, NP  ?ULTRACARE PEN NEEDLES 32G X 5 MM MISC  05/25/20   [provider]  ?vitamin B-12 (CYANOCOBALAMIN) 1000 MCG tablet Take 1 tablet (1,000 mcg total) by mouth daily. ?Patient not taking: Reported on 05/31/2021 04/27/21   [name], NP  ? ? ?Allergies as of 05/29/2021 - Review Complete 05/24/2021  ?Allergen Reaction Noted  ? Bee venom Hives 10/22/2014  ? Other  10/09/2014  ? Sulfa antibiotics  01/04/2014  ? ? ?Family History  ?Problem Relation Age of Onset  ? Cancer Mother   ?     throat  ? Diabetes Father   ? Heart disease Father   ? Cancer Maternal Grandmother   ? Cancer Maternal Grandfather   ?     pancreatic  ? Cancer Paternal Grandfather   ? ? ?Social History  ? ?Socioeconomic History  ? Marital status: Married  ?  Spouse name: Not on file  ? Number of children: 0  ? Years of education: Not on file  ? Highest education level: 12th grade  ?Occupational History  ? Occupation: Disabled  ?Tobacco Use  ? Smoking status: Never  ? Smokeless tobacco: Never  ?Vaping Use  ? Vaping Use: Never used  ?Substance and Sexual Activity  ? Alcohol use: No  ?  Alcohol/week: 0.0 standard drinks  ? Drug use: No  ? Sexual  activity: Not Currently  ?Other Topics Concern  ? Not on file  ?Social History Narrative  ? ** Merged History Encounter **  ?    ? ** Data from: 10/22/14 Enc Dept: [facility]  ?    ? ** Data from: 10/07/14 Enc Dept: [facility]  ? Arrest: Arrested in 2012 for indecent liberties, required to wear ankle monitor  ? ?Social Determinants of Health  ? ?Financial Resource Strain: Low Risk   ? Difficulty  of Paying Living Expenses: Not very hard  ?Food Insecurity: No Food Insecurity  ? Worried About Charity fundraiser in the Last Year: Never true  ? Ran Out of Food in the Last Year: Never true  ?Transportation Needs: No Transportation Needs  ? Lack of Transportation (Medical): No  ? Lack of Transportation (Non-Medical): No  ?Physical Activity: Inactive  ? Days of Exercise per Week: 0 days  ? Minutes of Exercise per Session: 0 min  ?Stress: No Stress Concern Present  ? Feeling of Stress : Only a little  ?Social Connections: Moderately Integrated  ? Frequency of Communication with Friends and Family: Twice a week  ? Frequency of Social Gatherings with Friends and Family: Once a week  ? Attends Religious Services: More than 4 times per year  ? Active Member of Clubs or Organizations: No  ? Attends Archivist Meetings: Never  ? Marital Status: Married  ?Intimate Partner Violence: Not At Risk  ? Fear of Current or Ex-Partner: No  ? Emotionally Abused: No  ? Physically Abused: No  ? Sexually Abused: No  ? ? ?Review of Systems: ?See HPI, otherwise negative ROS ? ?Physical Exam: ?BP (!) 156/78   Pulse 74   Temp (!) 97.2 ?F (36.2 ?C) (Temporal)   Resp 16   Ht 5' 2" (1.575 m)   Wt 80.7 kg   SpO2 97%   BMI 32.56 kg/m?  ?General:   Alert, cooperative in NAD ?Head:  Normocephalic and atraumatic. ?Respiratory:  Normal work of breathing. ?Cardiovascular:  RRR ? ?Impression/Plan: ?Alex Metro. is here for cataract surgery. ? ?Risks, benefits, limitations, and alternatives regarding cataract  surgery have been reviewed with the patient.  Questions have been answered.  All parties agreeable. ? ? ?Benay Pillow, MD  06/05/2021, 7:18 AM ? ? ?

## 2021-06-05 NOTE — Anesthesia Postprocedure Evaluation (Signed)
Anesthesia Post Note ? ?Patient: Nameer Summer. ? ?Procedure(s) Performed: CATARACT EXTRACTION PHACO AND INTRAOCULAR LENS PLACEMENT (IOC) LEFT DIABETIC 3.75 00:28.0 (Left: Eye) ? ? ?  ?Patient location during evaluation: PACU ?Anesthesia Type: MAC ?Level of consciousness: awake and alert ?Pain management: pain level controlled ?Vital Signs Assessment: post-procedure vital signs reviewed and stable ?Respiratory status: spontaneous breathing ?Cardiovascular status: blood pressure returned to baseline ?Postop Assessment: no apparent nausea or vomiting, adequate PO intake and no headache ?Anesthetic complications: no ? ? ?No notable events documented. ? ?Adele Barthel Kaya Klausing ? ? ? ? ? ?

## 2021-06-05 NOTE — Op Note (Signed)
OPERATIVE NOTE ? ?[name]. ?[id number] ?06/05/2021 ? ? ?PREOPERATIVE DIAGNOSIS:  Nuclear sclerotic cataract left eye.  H25.12 ?  ?POSTOPERATIVE DIAGNOSIS:    Nuclear sclerotic cataract left eye.   ?  ?PROCEDURE:  Phacoemusification with posterior chamber intraocular lens placement of the left eye  ? ?LENS:   ?Implant Name Type Inv. Item Serial No. Manufacturer Lot No. LRB No. Used Action  ?LENS IOL TECNIS EYHANCE 22.0 - [id number] Intraocular Lens LENS IOL TECNIS EYHANCE 22.0 [id number] [employer]  Left 1 Implanted  ?    ?Procedure(s) with comments: ?CATARACT EXTRACTION PHACO AND INTRAOCULAR LENS PLACEMENT (IOC) LEFT DIABETIC 3.75 00:28.0 (Left) - Diabetic ? ?DIB00 +22.0 ?  ?ULTRASOUND TIME: 0 minutes 28 seconds.  CDE 3.75 ?  ?SURGEON:  Benay Pillow, MD, MPH ?  ?ANESTHESIA:  Topical with tetracaine drops augmented with 1% preservative-free intracameral lidocaine. ? ?ESTIMATED BLOOD LOSS: <1 mL ?  ?COMPLICATIONS:  None. ?  ?DESCRIPTION OF PROCEDURE:  The patient was identified in the holding room and transported to the operating room and placed in the supine position under the operating microscope.  The left eye was identified as the operative eye and it was prepped and draped in the usual sterile ophthalmic fashion. ?  ?A 1.0 millimeter clear-corneal paracentesis was made at the 5:00 position. 0.5 ml of preservative-free 1% lidocaine with epinephrine was injected into the anterior chamber. ? The anterior chamber was filled with Healon 5 viscoelastic.  A 2.4 millimeter keratome was used to make a near-clear corneal incision at the 2:00 position.  A curvilinear capsulorrhexis was made with a cystotome and capsulorrhexis forceps.  Balanced salt solution was used to hydrodissect and hydrodelineate the nucleus. ?  ?Phacoemulsification was then used in stop and chop fashion to remove the lens nucleus and epinucleus.  The remaining cortex was then removed using the irrigation and aspiration handpiece. Healon  was then placed into the capsular bag to distend it for lens placement.  A lens was then injected into the capsular bag.  The remaining viscoelastic was aspirated. ?  ?Wounds were hydrated with balanced salt solution.  The anterior chamber was inflated to a physiologic pressure with balanced salt solution. ? ?Intracameral vigamox 0.1 mL undiltued was injected into the eye and a drop placed onto the ocular surface. ? No wound leaks were noted.  The patient was taken to the recovery room in stable condition without complications of anesthesia or surgery ? ?Benay Pillow ?06/05/2021, 7:57 AM ? ?

## 2021-06-05 NOTE — Anesthesia Preprocedure Evaluation (Addendum)
Anesthesia Evaluation  ?Patient identified by MRN, date of birth, ID band ?Patient awake ? ? ? ?History of Anesthesia Complications ?Negative for: history of anesthetic complications ? ?Airway ?Mallampati: III ? ?TM Distance: >3 FB ?Neck ROM: Full ? ? ? Dental ? ?(+) Edentulous Upper, Edentulous Lower ?  ?Pulmonary ?asthma ,  ?  ?Pulmonary exam normal ? ? ? ? ? ? ? Cardiovascular ?Exercise Tolerance: Good ?hypertension, Normal cardiovascular exam ? ? ?  ?Neuro/Psych ?CVA (2020, on Pavix. Residual L sided weakness), Residual Symptoms   ? GI/Hepatic ?GERD  Medicated,  ?Endo/Other  ?diabetes, Poorly Controlled, Type 2 ? Renal/GU ?Renal disease (diabetic nephropathy)  ? ?  ?Musculoskeletal ? ? Abdominal ?  ?Peds ? Hematology ?  ?Anesthesia Other Findings ? ? Reproductive/Obstetrics ? ?  ? ? ? ? ? ? ? ? ? ? ? ? ? ?  ?  ? ? ? ? ? ? ?Anesthesia Physical ?Anesthesia Plan ? ?ASA: 3 ? ?Anesthesia Plan: MAC  ? ?Post-op Pain Management: Minimal or no pain anticipated  ? ?Induction:  ? ?PONV Risk Score and Plan: 1 and TIVA, Midazolam and Treatment may vary due to age or medical condition ? ?Airway Management Planned: Nasal Cannula and Natural Airway ? ?Additional Equipment: None ? ?Intra-op Plan:  ? ?Post-operative Plan:  ? ?Informed Consent: I have reviewed the patients History and Physical, chart, labs and discussed the procedure including the risks, benefits and alternatives for the proposed anesthesia with the patient or authorized representative who has indicated his/her understanding and acceptance.  ? ? ? ? ? ?Plan Discussed with: CRNA ? ?Anesthesia Plan Comments:   ? ? ? ? ? ? ?Anesthesia Quick Evaluation ? ?

## 2021-06-05 NOTE — Anesthesia Procedure Notes (Signed)
Procedure Name: [place] ?Date/Time: 06/05/2021 7:44 AM ?Performed by: Jeannene Patella, CRNA ?Pre-anesthesia Checklist: Patient identified, Emergency Drugs available, Suction available, Timeout performed and Patient being monitored ?Patient Re-evaluated:Patient Re-evaluated prior to induction ?Oxygen Delivery Method: Nasal cannula ?Placement Confirmation: positive ETCO2 ? ? ? ? ?

## 2021-06-06 ENCOUNTER — Encounter: Payer: Self-pay | Admitting: Ophthalmology

## 2021-06-14 ENCOUNTER — Encounter: Payer: Self-pay | Admitting: Licensed Clinical Social Worker

## 2021-06-14 ENCOUNTER — Telehealth: Payer: Medicare Other

## 2021-06-14 ENCOUNTER — Ambulatory Visit: Payer: Medicare Other | Admitting: Licensed Clinical Social Worker

## 2021-06-14 ENCOUNTER — Ambulatory Visit: Payer: Self-pay

## 2021-06-14 DIAGNOSIS — F4321 Adjustment disorder with depressed mood: Secondary | ICD-10-CM

## 2021-06-14 DIAGNOSIS — E1159 Type 2 diabetes mellitus with other circulatory complications: Secondary | ICD-10-CM

## 2021-06-14 DIAGNOSIS — R809 Proteinuria, unspecified: Secondary | ICD-10-CM

## 2021-06-14 DIAGNOSIS — Q159 Congenital malformation of eye, unspecified: Secondary | ICD-10-CM

## 2021-06-14 DIAGNOSIS — E1169 Type 2 diabetes mellitus with other specified complication: Secondary | ICD-10-CM

## 2021-06-14 DIAGNOSIS — E1165 Type 2 diabetes mellitus with hyperglycemia: Secondary | ICD-10-CM

## 2021-06-14 NOTE — Patient Instructions (Addendum)
Visit Information ? ?Thank you for taking time to visit with me today. Please don't hesitate to contact me if I can be of assistance to you before our next scheduled telephone appointment. ? ?Following are the goals we discussed today: managing symptoms of grief ?Task & activities to accomplish goals: ?Continue with compliance of taking medication  ?Call Doctor for any ongoing needs ? ?Our next appointment is by telephone on June 26th  at 1:15 ? ?Please call the care guide team at [phone] if you need to cancel or reschedule your appointment.  ? ?If you are experiencing a Mental Health or [place] or need someone to talk to, please call the Suicide and Crisis Lifeline: 988 ?call the [place] National Suicide Prevention Lifeline: (647)470-3406 or TTY: 2195531409 TTY (217)414-8728) to talk to a trained counselor ?call 1-800-273-TALK (toll free, 24 hour hotline)  ? ?The patient verbalized understanding of instructions, educational materials, and care plan provided today and agreed to receive a mailed copy of patient instructions, educational materials, and care plan.  ? ? Casimer Lanius, LCSW ?Licensed Clinical Social Worker [facility]  ?CCM LCSW Coverage for Christa See, LCSW ?[phone]  ?

## 2021-06-14 NOTE — Chronic Care Management (AMB) (Signed)
?Chronic Care Management  ? ?CCM RN Visit Note ? ?06/14/2021 ?Name: [name]. MRN: [id number] DOB: 1960-03-27 ? ?Subjective: ?[name]. is a 61 y.o. year old male who is a primary care patient of Cannady, Barbaraann Faster, NP. The care management team was consulted for assistance with disease management and care coordination needs.   ? ?Engaged with patient by telephone for follow up visit in response to provider referral for case management and/or care coordination services.  ? ?Consent to Services:  ?The patient was given information about Chronic Care Management services, agreed to services, and gave verbal consent prior to initiation of services.  Please see initial visit note for detailed documentation.  ? ?Patient agreed to services and verbal consent obtained.  ? ?Assessment: Review of patient past medical history, allergies, medications, health status, including review of consultants reports, laboratory and other test data, was performed as part of comprehensive evaluation and provision of chronic care management services.  ? ?SDOH (Social Determinants of Health) assessments and interventions performed:   ? ?CCM Care Plan ? ?Allergies  ?Allergen Reactions  ? Bee Venom Hives  ?  All kinds of bees  ? Other   ?  Certain powders  ? Sulfa Antibiotics   ? ? ?Outpatient Encounter Medications as of 06/14/2021  ?Medication Sig  ? aspirin 81 MG EC tablet Take 1 tablet (81 mg total) by mouth daily.  ? atorvastatin (LIPITOR) 80 MG tablet TAKE 1 TABLET BY MOUTH ONCE DAILY  ? Blood Glucose Monitoring Suppl (ONE TOUCH ULTRA 2) w/Device KIT Use to check blood sugar 4 times a day  ? BREO ELLIPTA 100-25 MCG/ACT AEPB INHALE 1 PUFF BY MOUTH ONCE DAILY  ? Cholecalciferol 1.25 MG (50000 UT) TABS Take 1 tablet by mouth once a week.  ? clobetasol cream (TEMOVATE) 8.89 % Apply 1 application topically 2 (two) times daily. Apply to affected area  ? clopidogrel (PLAVIX) 75 MG tablet Take 1 tablet by mouth daily.  ?  empagliflozin (JARDIANCE) 25 MG TABS tablet Take 1 tablet (25 mg total) by mouth daily.  ? EPINEPHRINE 0.3 mg/0.3 mL IJ SOAJ injection INJECT 1 SYRINGE INTO OUTER THIGH ONCE AS NEEDED FOR SEVERE ALLERGIC REACTION.  ? fenofibrate 54 MG tablet TAKE 1 TABLET BY MOUTH ONCE DAILY  ? gabapentin (NEURONTIN) 300 MG capsule TAKE 1 CAPSULE BY MOUTH TWICE DAILY  ? glucose blood (ONETOUCH ULTRA) test strip USE TO CHECK BLOOD SUGAR 3 TIMES A DAY AS DIRECTED  ? insulin degludec (TRESIBA FLEXTOUCH) 100 UNIT/ML FlexTouch Pen Take 40 units into the skin in morning and 40 units into skin every evening.  ? insulin lispro (HUMALOG KWIKPEN) 100 UNIT/ML KwikPen Inject 5 Units into the skin 3 (three) times daily. Prior to breakfast, lunch, and dinner.  ? Insulin Pen Needle (PEN NEEDLES) 32G X 4 MM MISC 1 kit by Does not apply route daily.  ? Lancets (ONETOUCH DELICA PLUS [id number]) MISC USE AS DIRECTED.  ? lisinopril (ZESTRIL) 20 MG tablet TAKE 1 TABLET BY MOUTH ONCE DAILY  ? Misc. Devices (PULSE OXIMETER FOR FINGER) MISC To check O2 saturations once daily with asthma and document + check if any shortness of breath  ? montelukast (SINGULAIR) 10 MG tablet TAKE 1 TABLET BY MOUTH AT BEDTIME  ? nitroGLYCERIN (NITROSTAT) 0.4 MG SL tablet Place 1 tablet (0.4 mg total) under the tongue every 5 (five) minutes as needed for chest pain.  ? omeprazole (PRILOSEC) 20 MG capsule TAKE 1 CAPSULE BY MOUTH ONCE  DAILY  ? tiZANidine (ZANAFLEX) 2 MG tablet Take 1 tablet (2 mg total) by mouth every 6 (six) hours as needed for muscle spasms.  ? trimethoprim-polymyxin b (POLYTRIM) ophthalmic solution Place 1 drop into the right eye every 6 (six) hours.   ? ULTRACARE PEN NEEDLES 32G X 5 MM MISC   ? vitamin B-12 (CYANOCOBALAMIN) 1000 MCG tablet Take 1 tablet (1,000 mcg total) by mouth daily. (Patient not taking: Reported on 05/31/2021)  ? ?No facility-administered encounter medications on file as of 06/14/2021.  ? ? ?Patient Active Problem List  ? Diagnosis Date  Noted  ? Right hip pain 05/24/2021  ? B12 deficiency 04/26/2021  ? Heart murmur 09/21/2020  ? Carotid artery bruit 09/21/2020  ? 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 Atherosclerosis of both carotid arteries 03/22/2019  ? History of CVA (cerebrovascular accident) 03/06/2019  ? Uncontrolled type 2 diabetes mellitus with hyperglycemia ([place])   ? Obesity 09/03/2018  ? Frequent falls 05/15/2016  ? Vitamin D deficiency 08/22/2015  ? Carpal tunnel syndrome on left 05/12/2015  ? External hemorrhoids 12/20/2014  ? Allergic rhinitis 10/21/2014  ? Diverticulosis 10/21/2014  ? Hypertension associated with diabetes ([place]) 10/21/2014  ? Hyperlipidemia associated with type 2 diabetes mellitus ([place]) 10/21/2014  ? Asthma 10/21/2014  ? GERD (gastroesophageal reflux disease) 10/21/2014  ? NAFLD (nonalcoholic fatty liver disease) 10/21/2014  ? Arthritis 10/21/2014  ? History of pancreatitis 10/07/2014  ? Phenylketonuria (PKU) ([place]) 10/07/2014  ? Renal cyst, left 10/07/2014  ? Gall bladder polyp 10/01/2013  ? ? ?Conditions to be addressed/monitored:HTN, HLD, DMII, and eye problems with cataract removal ? ?Care Plan : RNCM; General Plan of Care (Adult) for Chronic Disease Management and Care Coordination Needs  ?Updates made by Vanita Ingles, RN since 06/14/2021 12:00 AM  ?  ? ?Problem: RNCM: Development of Care Plan for Chronic Disease Management and Care Coordination Needs (DM, HTN, HLD, Eye sty/ulcer/cataract   ?Priority: High  ?  ? ?Long-Range Goal: RNCM: Development of Care Plan for Chronic Disease Management and Care Coordination Needs (DM, HTN, HLD, Eye sty/ulcer/cataract   ?Start Date: 01/03/2021  ?Expected End Date: 01/03/2022  ?Priority: High  ?Note:   ?Current Barriers:  ?Knowledge Deficits related to plan of care for management of HTN, HLD, DMII, and eye sty/ulcer   ?Care Coordination needs related to Financial constraints related to limited income, Limited social support, Transportation, and Level of  care concerns  ?Chronic Disease Management support and education needs related to HTN, HLD, DMII, and eye sty/ulcer- right ?Lacks caregiver support.  ?Film/video editor.  ?Transportation barriers ? ?RNCM Clinical Goal(s):  ?Patient will verbalize understanding of plan for management of HTN, HLD, DMII, and right eye ulcer/sty  ?verbalize basic understanding of HTN, HLD, DMII, and right eye ulcer/sty disease process and self health management plan  ?take all medications exactly as prescribed and will call provider for medication related questions ?demonstrate understanding of rationale for each prescribed medication as evidence of compliance with medications and calling for refills before running out.  ?attend all scheduled medical appointments: 07-26-2021 at 3 pm ?demonstrate improved and ongoing adherence to prescribed treatment plan for HTN, HLD, DMII, and eye sty/ulcer as evidenced by daily monitoring and recording of CBG  adherence to ADA/ carb modified diet adherence to prescribed medication regimen contacting provider for new or worsened symptoms or questions  ?demonstrate improved and ongoing health management independence for chronic conditions as evidence of effective management and no acute exacerbations ?continue to work  with RN Care Manager and/or Social Worker to address care management and care coordination needs related to HTN, HLD, DMII, and eye sty/ulcer  ?demonstrate a decrease in HTN, HLD, DMII, and eye styl/ulcer exacerbations  ?demonstrate ongoing self health care management ability  through collaboration with RN Care manager, provider, and care team.  ? ?Interventions: ?1:1 collaboration with primary care provider regarding development and update of comprehensive plan of care as evidenced by provider attestation and co-signature ?Inter-disciplinary care team collaboration (see longitudinal plan of care) ?Evaluation of current treatment plan related to  self management and patient's adherence  to plan as established by provider ? ? ?SDOH Barriers (Status: Goal on track: YES.)  ?Patient interviewed and SDOH assessment performed ?       ?SDOH Interventions   ? ?Flowsheet Row Most Recent Value  ?SDOH

## 2021-06-14 NOTE — Patient Instructions (Signed)
Visit Information ? ?Thank you for taking time to visit with me today. Please don't hesitate to contact me if I can be of assistance to you before our next scheduled telephone appointment. ? ?Following are the goals we discussed today:  ?RNCM Clinical Goal(s):  ?Patient will verbalize understanding of plan for management of HTN, HLD, DMII, and right eye ulcer/sty  ?verbalize basic understanding of HTN, HLD, DMII, and right eye ulcer/sty disease process and self health management plan  ?take all medications exactly as prescribed and will call provider for medication related questions ?demonstrate understanding of rationale for each prescribed medication as evidence of compliance with medications and calling for refills before running out.  ?attend all scheduled medical appointments: 07-26-2021 at 3 pm ?demonstrate improved and ongoing adherence to prescribed treatment plan for HTN, HLD, DMII, and eye sty/ulcer as evidenced by daily monitoring and recording of CBG  adherence to ADA/ carb modified diet adherence to prescribed medication regimen contacting provider for new or worsened symptoms or questions  ?demonstrate improved and ongoing health management independence for chronic conditions as evidence of effective management and no acute exacerbations ?continue to work with Consulting civil engineer and/or Social Worker to address care management and care coordination needs related to HTN, HLD, DMII, and eye sty/ulcer  ?demonstrate a decrease in HTN, HLD, DMII, and eye styl/ulcer exacerbations  ?demonstrate ongoing self health care management ability  through collaboration with RN Care manager, provider, and care team.  ?  ?Interventions: ?1:1 collaboration with primary care provider regarding development and update of comprehensive plan of care as evidenced by provider attestation and co-signature ?Inter-disciplinary care team collaboration (see longitudinal plan of care) ?Evaluation of current treatment plan related to  self  management and patient's adherence to plan as established by provider ?  ?  ?SDOH Barriers (Status: Goal on track: YES.)  ?Patient interviewed and SDOH assessment performed ?       ?SDOH Interventions   ?  ?Flowsheet Row Most Recent Value  ?SDOH Interventions    ?Food Insecurity Interventions Intervention Not Indicated  ?Financial Strain Interventions Other (Comment)  [continued support from care guides and resources]  ?Housing Interventions Intervention Not Indicated  ?Intimate Partner Violence Interventions Intervention Not Indicated  ?Physical Activity Interventions Other (Comments)  [is active but no structured activity]  ?Stress Interventions Intervention Not Indicated  ?Social Connections Interventions Intervention Not Indicated, Other (Comment)  [good support system]  ?Transportation Interventions [employer]  ?  ?   ?Patient interviewed and appropriate assessments performed ?Provided patient with information about resources available. The patient and his wife have worked with care guides before and know the process for getting help in the community for expressed needs ?Advised patient to call the office for changes in [place], questions or concerns, the patient states they are getting food stamps now and are thankful for the food stamps ?Provided education to patient/caregiver regarding level of care options. ?  ?  ?  ?Diabetes:  (Status: Goal on track: YES.) ?     ?Lab Results  ?Component Value Date  ?  HGBA1C 10.2 (H) 04/26/2021  ?01-18-2021 8.7 ?Assessed patient's understanding of A1c goal: <7% ?Provided education to patient about basic DM disease process; ?Reviewed medications with patient and discussed importance of medication adherence. 06-14-2021: The patient states that he is taking his medications as ordered        ?Reviewed prescribed diet with patient heart healthy/ADA diet. 06-14-2021: The patient is doing better with dietary restrictions and denies any issues  with compliance. Review  of dietary restrictions. The patients wife states that he is having better readings now.  ?Counseled on importance of regular laboratory monitoring as prescribed. 06-14-2021: The patient has regular lab work         ?Discussed plans with patient for ongoing care management follow up and provided patient with direct contact information for care management team;      ?Provided patient with written educational materials related to hypo and hyperglycemia and importance of correct treatment. 06-14-2021: The patients lowest reading was 80 and highest 198.     ?Reviewed scheduled/upcoming provider appointments including: 07-26-2021 at 3 pm;         ?Advised patient, providing education and rationale, to check cbg as directed and record. 01-03-2021: The patient checked his blood sugar while on the phone with the [facility] and it was 149. The patient states he has been in the 100's range and hardly ever over 200. Praised the patient for positive changes. Will have a new A1C coming up for follow up with the pcp on 01-18-2021. Denies any acute findings. States he is eating healthier and staying away from sodas currently.   02-22-2021: Alc trending down, In office in November the A1C was 8.7 down from 9.9.  The patients blood sugar this am was 165. The patient and patients wife encouraged to keep regular checks on blood sugars and review of goal of <130 fasting and <180 post prandial. 04-12-2021: Is checking more frequently. States he is doing better with checking his blood sugars. Review of goals.    06-14-2021: See chart below for most recent blood sugar readings: ?  ?Blood Sugars Date AM Noon PM  ?  06/08/2021 133   178  ?  06/09/2021 186 162 153  ?  06/10/2021 198 181 187  ?  06/13/2021 158 159 196  ?  06/14/2021 188 198    ?  ?call provider for findings outside established parameters;       ?Review of patient status, including review of consultants reports, relevant laboratory and other test results, and medications completed;        ?Screening for signs and symptoms of depression related to chronic disease state;        ?Assessed social determinant of health barriers;        ?  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 04-12-2021: The patient states that he is seeing better and they have put sensors in his eyes and he can see better. He is following with specialist and will be likely having cataract surgery. 06-14-2021: The patient is doing better and having cataract surgery on his right eye on 07-10-2021. He had cataract surgery on his left eye on 06-05-2021 and did well. Has 20/20 vision in that eye now. Goes tomorrow for reevaluation due to the eye conditions he has been dealing with.  ?Discussed plans with patient for ongoing care management follow up and provided patient with direct contact information for care management team ?Advised patient to call the specialist for changes in his vision, pain, or concerns related to right eye with clogged sty and ulcer noted; ?Provided education to patient re: keeping appointments, monitoring for changes in vision or other complications; ?Reviewed medications with patient and discussed 01-03-2021: The patient states that he just got his abx  drops today for his eye and he is using them. States that they were on back order. He can see some today out of his right eye, education and support given. 04-12-2021: The patient is seeing the specialist weekly and is taking medications as prescribed. 06-14-2021: The patient is compliant with medications.  ; ?Reviewed scheduled/upcoming provider appointments including weekly with specialist with anticipation of cataract surgery in March of 2023. 06-14-2021: Had left cataract removed on 06-05-2021 and will have the right on 07-10-2021; ?Discussed plans with patient for ongoing care  management follow up and provided patient with direct contact information for care management team; ?  ?Hyperlipidemia:  (Status: Goal on Track (progressing): YES.) ?     ?Lab Results  ?Component Value Date  ?

## 2021-06-14 NOTE — Chronic Care Management (AMB) (Signed)
?Chronic Care Management  ? Clinical Social Work Note ? ?06/14/2021 ?Name: [name]. MRN: [id number] DOB: [birth year] ? ?[name]. is a 61 y.o. year old male who is a primary care patient of Cannady, Barbaraann Faster, NP. The CCM team was consulted to assist the patient with chronic disease management and/or care coordination needs related to: Grief Counseling.  ? ?Engaged with patient by telephone for follow up visit in response to provider referral for social work chronic care management and care coordination services.  ? ?Consent to Services:  ?The patient was given information about Chronic Care Management services, agreed to services, and gave verbal consent prior to initiation of services.  Please see initial visit note for detailed documentation.  ? ?Patient agreed to services and consent obtained.  ? ?Summary: Assessed patient's current treatment, progress, coping skills, support system and barriers to care.  Patient was accompanied by his wife [name] who provided information during this encounter. Patient reports doing welling with grief has good days and bad days. Does not want grief counseling at this time. Concerns today were related to managing health.  Informed patient and wife CCM RN would be calling at 1:00 today . No new LCSW needs identified during this encounter. See Care Plan below for interventions and patient self-care actives. ? ?Recommendation: Patient may benefit from, and is in agreement to contact office for ongoing needs.  ? ?Follow up Plan: Patient's caregiver would like continued follow-up from CCM LCSW .  per caregiver's request CCM LCSW will follow up in 90.  They will call the office if needed prior to next encounter. ?  ?Assessment: Review of patient past medical history, allergies, medications, and health status, including review of relevant consultants reports was performed today as part of a comprehensive evaluation and provision of chronic care management and care  coordination services.    ? ?SDOH (Social Determinants of Health) assessments and interventions performed:   ? ?Advanced Directives Status: Not addressed in this encounter. ? ?CCM Care Plan ?Conditions to be addressed/monitored:  Grief ;  ? ?Care Plan : General Social Work (Adult)  ?Updates made by Maurine Cane, LCSW since 06/14/2021 12:00 AM  ?  ? ?Problem: Coping Skills (General Plan of Care)   ?  ? ?Long-Range Goal: Coping Skills Enhanced   ?Start Date: 05/16/2020  ?This Visit's Progress: On track  ?Recent Progress: On track  ?Priority: Medium  ?Note:   ?Current Barriers:  ?Financial constraints ?Limited social support ?Level of care concerns ?ADL IADL limitations ?Literacy concerns ?Cognitive Deficits ?Lacks knowledge of community resource: available financial support resources within the area as well as socialization opportunities within the area ?Grief support related to the recent loss of his mother ?Clinical Social Work Clinical Goal(s):  ?Over the next 120 days, client will work with SW to address concerns related to improving self-care and implementing it whenever able ? ?Interventions: ?1:1 collaboration with primary care provider regarding development and update of comprehensive plan of care as evidenced by provider attestation and co-signature ?Inter-disciplinary care team collaboration (see longitudinal plan of care) ?Evaluation of current treatment plan related to  self management and patient's adherence to plan as established by provider ? ?Mental Health:  (Status: Goal on Track (progressing): YES.) ?Evaluation of current treatment plan related to Grief ?Active listening / Reflection utilized  ?Emotional Support Provided ? ?Task & activities to accomplish goals: ?Continue with compliance of taking medication  ?Call Doctor for any ongoing needs ?  ? Casimer Lanius,  LCSW ?Licensed Clinical Social Worker [facility]  ?CCM LCSW Coverage for Christa See, LCSW ?[phone]  ? ? ?

## 2021-06-15 DIAGNOSIS — H2511 Age-related nuclear cataract, right eye: Secondary | ICD-10-CM | POA: Diagnosis not present

## 2021-06-16 DIAGNOSIS — E1169 Type 2 diabetes mellitus with other specified complication: Secondary | ICD-10-CM

## 2021-06-16 DIAGNOSIS — F32A Depression, unspecified: Secondary | ICD-10-CM | POA: Diagnosis not present

## 2021-06-16 DIAGNOSIS — E785 Hyperlipidemia, unspecified: Secondary | ICD-10-CM

## 2021-06-16 DIAGNOSIS — I10 Essential (primary) hypertension: Secondary | ICD-10-CM

## 2021-06-26 ENCOUNTER — Telehealth: Payer: Self-pay | Admitting: Nurse Practitioner

## 2021-06-26 ENCOUNTER — Encounter (INDEPENDENT_AMBULATORY_CARE_PROVIDER_SITE_OTHER): Payer: Medicare Other | Admitting: Vascular Surgery

## 2021-06-26 MED ORDER — ONETOUCH ULTRA 2 W/DEVICE KIT: PACK | 0 refills | Status: DC

## 2021-06-26 MED ORDER — ONETOUCH ULTRA VI STRP: ORAL_STRIP | 3 refills | Status: DC

## 2021-06-26 MED ORDER — ONETOUCH DELICA PLUS LANCET33G MISC: 12 refills | Status: DC

## 2021-06-26 NOTE — Telephone Encounter (Signed)
Medication Refill - Medication: Patient requesting a diabetic kit. Patient states he had his diabetic kit for 10 years and the battery is low and he has no batteries.  ? ?Has the patient contacted their pharmacy? Yes.   ? ?(Agent: If yes, when and what did the pharmacy advise?) Contact PCP office and request a new diabetic kit. Patient would like request expedited  ? ?Preferred Pharmacy (with phone number or street name):  ?[facility], [place]. Phone:  [phone]  ?Fax:  [phone]  ?  ? ? ? ?Has the patient been seen for an appointment in the last year OR does the patient have an upcoming appointment? Yes.   ? ?Agent: Please be advised that RX refills may take up to 3 business days. We ask that you follow-up with your pharmacy. ? ?

## 2021-06-26 NOTE — Telephone Encounter (Signed)
Routing to provider.  ?

## 2021-06-27 NOTE — Telephone Encounter (Signed)
Called and notified patient that new supplies have been sent in.  ?

## 2021-07-03 ENCOUNTER — Encounter: Payer: Self-pay | Admitting: Ophthalmology

## 2021-07-03 DIAGNOSIS — H168 Other keratitis: Secondary | ICD-10-CM | POA: Diagnosis not present

## 2021-07-06 NOTE — Discharge Instructions (Signed)

## 2021-07-10 ENCOUNTER — Other Ambulatory Visit: Payer: Self-pay

## 2021-07-10 ENCOUNTER — Encounter
Admission: RE | Disposition: A | Discharge status: Home / Self Care | Payer: Self-pay | Source: Home / Self Care | Attending: Ophthalmology

## 2021-07-10 ENCOUNTER — Ambulatory Visit: Payer: Medicare Other | Admitting: Anesthesiology

## 2021-07-10 ENCOUNTER — Ambulatory Visit
Admission: RE | Admit: 2021-07-10 | Discharge: 2021-07-10 | Disposition: A | Discharge status: Home / Self Care | Payer: Medicare Other | Attending: Ophthalmology | Admitting: Ophthalmology

## 2021-07-10 ENCOUNTER — Encounter: Payer: Self-pay | Admitting: Ophthalmology

## 2021-07-10 DIAGNOSIS — E1136 Type 2 diabetes mellitus with diabetic cataract: Secondary | ICD-10-CM | POA: Insufficient documentation

## 2021-07-10 DIAGNOSIS — E1165 Type 2 diabetes mellitus with hyperglycemia: Secondary | ICD-10-CM | POA: Insufficient documentation

## 2021-07-10 DIAGNOSIS — H2511 Age-related nuclear cataract, right eye: Secondary | ICD-10-CM | POA: Insufficient documentation

## 2021-07-10 DIAGNOSIS — J45909 Unspecified asthma, uncomplicated: Secondary | ICD-10-CM | POA: Insufficient documentation

## 2021-07-10 DIAGNOSIS — Z7902 Long term (current) use of antithrombotics/antiplatelets: Secondary | ICD-10-CM | POA: Diagnosis not present

## 2021-07-10 DIAGNOSIS — Z8673 Personal history of transient ischemic attack (TIA), and cerebral infarction without residual deficits: Secondary | ICD-10-CM | POA: Insufficient documentation

## 2021-07-10 DIAGNOSIS — E1121 Type 2 diabetes mellitus with diabetic nephropathy: Secondary | ICD-10-CM | POA: Diagnosis not present

## 2021-07-10 DIAGNOSIS — K219 Gastro-esophageal reflux disease without esophagitis: Secondary | ICD-10-CM | POA: Diagnosis not present

## 2021-07-10 DIAGNOSIS — H25811 Combined forms of age-related cataract, right eye: Secondary | ICD-10-CM | POA: Diagnosis not present

## 2021-07-10 HISTORY — PX: CATARACT EXTRACTION W/PHACO: SHX586

## 2021-07-10 LAB — GLUCOSE, CAPILLARY
Glucose-Capillary: 268 mg/dL — ABNORMAL HIGH (ref 70–99)
Glucose-Capillary: 232 mg/dL — ABNORMAL HIGH (ref 70–99)

## 2021-07-10 SURGERY — PHACOEMULSIFICATION, CATARACT, WITH IOL INSERTION
Anesthesia: Monitor Anesthesia Care | Site: Eye | Laterality: Right

## 2021-07-10 MED ORDER — ARMC OPHTHALMIC DILATING DROPS
1.0000 "application " | OPHTHALMIC | Status: DC | PRN
  Administered 2021-07-10 (×3): 1 via OPHTHALMIC

## 2021-07-10 MED ORDER — FENTANYL CITRATE (PF) 100 MCG/2ML IJ SOLN
INTRAMUSCULAR | Status: DC | PRN
  Administered 2021-07-10 (×2): 50 ug via INTRAVENOUS

## 2021-07-10 MED ORDER — SIGHTPATH DOSE#1 BSS IO SOLN
INTRAOCULAR | Status: DC | PRN
  Administered 2021-07-10: 67 mL via OPHTHALMIC

## 2021-07-10 MED ORDER — DEXMEDETOMIDINE (PRECEDEX) IN NS 20 MCG/5ML (4 MCG/ML) IV SYRINGE
PREFILLED_SYRINGE | INTRAVENOUS | Status: DC | PRN
  Administered 2021-07-10: 10 ug via INTRAVENOUS

## 2021-07-10 MED ORDER — TETRACAINE HCL 0.5 % OP SOLN
1.0000 [drp] | OPHTHALMIC | Status: DC | PRN
  Administered 2021-07-10 (×3): 1 [drp] via OPHTHALMIC

## 2021-07-10 MED ORDER — ACETAMINOPHEN 160 MG/5ML PO SOLN: 325.0000 mg | ORAL | Status: DC | PRN

## 2021-07-10 MED ORDER — MIDAZOLAM HCL 2 MG/2ML IJ SOLN
INTRAMUSCULAR | Status: DC | PRN
  Administered 2021-07-10: 2 mg via INTRAVENOUS

## 2021-07-10 MED ORDER — LIDOCAINE HCL (PF) 2 % IJ SOLN
INTRAOCULAR | Status: DC | PRN
  Administered 2021-07-10: 1 mL via INTRAOCULAR

## 2021-07-10 MED ORDER — SIGHTPATH DOSE#1 BSS IO SOLN
INTRAOCULAR | Status: DC | PRN
  Administered 2021-07-10: 15 mL

## 2021-07-10 MED ORDER — ACETAMINOPHEN 325 MG PO TABS: 325.0000 mg | ORAL_TABLET | ORAL | Status: DC | PRN

## 2021-07-10 MED ORDER — SIGHTPATH DOSE#1 SODIUM HYALURONATE 23 MG/ML IO SOLUTION
PREFILLED_SYRINGE | INTRAOCULAR | Status: DC | PRN
  Administered 2021-07-10: 0.6 mL via INTRAOCULAR

## 2021-07-10 MED ORDER — MOXIFLOXACIN HCL 0.5 % OP SOLN
OPHTHALMIC | Status: DC | PRN
  Administered 2021-07-10: 0.2 mL via OPHTHALMIC

## 2021-07-10 MED ORDER — ONDANSETRON HCL 4 MG/2ML IJ SOLN: 4.0000 mg | Freq: Once | INTRAMUSCULAR | Status: DC | PRN

## 2021-07-10 MED ORDER — LACTATED RINGERS IV SOLN: INTRAVENOUS | Status: DC

## 2021-07-10 MED ORDER — SIGHTPATH DOSE#1 SODIUM HYALURONATE 10 MG/ML IO SOLUTION
PREFILLED_SYRINGE | INTRAOCULAR | Status: DC | PRN
Start: 2021-07-10 — End: 2021-07-10
  Administered 2021-07-10: 0.85 mL via INTRAOCULAR

## 2021-07-10 SURGICAL SUPPLY — 14 items
CATARACT SUITE SIGHTPATH (MISCELLANEOUS) ×2 IMPLANT
DISSECTOR HYDRO NUCLEUS 50X22 (MISCELLANEOUS) ×2 IMPLANT
FEE CATARACT SUITE SIGHTPATH (MISCELLANEOUS) ×1 IMPLANT
GLOVE SURG GAMMEX PI TX LF 7.5 (GLOVE) ×2 IMPLANT
GLOVE SURG SYN 8.5  E (GLOVE) ×1
GLOVE SURG SYN 8.5 E (GLOVE) ×1 IMPLANT
GLOVE SURG SYN 8.5 PF PI (GLOVE) ×1 IMPLANT
LENS IOL TECNIS EYHANCE 20.5 (Intraocular Lens) ×1 IMPLANT
NDL FILTER BLUNT 18X1 1/2 (NEEDLE) ×1 IMPLANT
NEEDLE FILTER BLUNT 18X 1/2SAF (NEEDLE) ×1
NEEDLE FILTER BLUNT 18X1 1/2 (NEEDLE) ×1 IMPLANT
SYR 3ML LL SCALE MARK (SYRINGE) ×2 IMPLANT
SYR 5ML LL (SYRINGE) ×2 IMPLANT
WATER STERILE IRR 250ML POUR (IV SOLUTION) ×2 IMPLANT

## 2021-07-10 NOTE — Op Note (Signed)
OPERATIVE NOTE ? ?[name] ?[id number] ?07/10/2021 ? ? ?PREOPERATIVE DIAGNOSIS:  Nuclear sclerotic cataract right eye.  H25.11 ?  ?POSTOPERATIVE DIAGNOSIS:    Nuclear sclerotic cataract right eye.   ?  ?PROCEDURE:  Phacoemusification with posterior chamber intraocular lens placement of the right eye  ? ?LENS:   ?Implant Name Type Inv. Item Serial No. Manufacturer Lot No. LRB No. Used Action  ?LENS IOL TECNIS EYHANCE 20.5 - [id number] Intraocular Lens LENS IOL TECNIS EYHANCE 20.5 [id number] [employer]  Right 1 Implanted  ?    ? ?Procedure(s) with comments: ?CATARACT EXTRACTION PHACO AND INTRAOCULAR LENS PLACEMENT (IOC) RIGHT DIABETIC (Right) - Diabetic ?2.66 ?00:24.4 ? ?DIB00 +20.5 ?  ?ULTRASOUND TIME: 0 minutes 24 seconds.  CDE 2.66 ?  ?SURGEON:  Benay Pillow, MD, MPH ? ?ANESTHESIOLOGIST: Anesthesiologist: Veda Canning, MD ?CRNA: Jeannene Patella, CRNA ?  ?ANESTHESIA:  Topical with tetracaine drops augmented with 1% preservative-free intracameral lidocaine. ? ?ESTIMATED BLOOD LOSS: less than 1 mL. ?  ?COMPLICATIONS:  None. ?  ?DESCRIPTION OF PROCEDURE:  The patient was identified in the holding room and transported to the operating room and placed in the supine position under the operating microscope.  The right eye was identified as the operative eye and it was prepped and draped in the usual sterile ophthalmic fashion. ?  ?A 1.0 millimeter clear-corneal paracentesis was made at the 10:30 position. 0.5 ml of preservative-free 1% lidocaine with epinephrine was injected into the anterior chamber. ? The anterior chamber was filled with Healon 5 viscoelastic.  A 2.4 millimeter keratome was used to make a near-clear corneal incision at the 8:00 position.  A curvilinear capsulorrhexis was made with a cystotome and capsulorrhexis forceps.  Balanced salt solution was used to hydrodissect and hydrodelineate the nucleus. ?  ?Phacoemulsification was then used in stop and chop fashion to remove the lens nucleus  and epinucleus.  The remaining cortex was then removed using the irrigation and aspiration handpiece. Healon was then placed into the capsular bag to distend it for lens placement.  A lens was then injected into the capsular bag.  The remaining viscoelastic was aspirated. ?  ?Wounds were hydrated with balanced salt solution.  The anterior chamber was inflated to a physiologic pressure with balanced salt solution.  ? ?Intracameral vigamox 0.1 mL undiluted was injected into the eye and a drop placed onto the ocular surface. ? ?No wound leaks were noted.  The patient was taken to the recovery room in stable condition without complications of anesthesia or surgery ? ?Benay Pillow ?07/10/2021, 7:56 AM ? ?

## 2021-07-10 NOTE — Anesthesia Preprocedure Evaluation (Signed)
Anesthesia Evaluation  ?Patient identified by MRN, date of birth, ID band ?Patient awake ? ? ? ?History of Anesthesia Complications ?Negative for: history of anesthetic complications ? ?Airway ?Mallampati: III ? ?TM Distance: >3 FB ?Neck ROM: Full ? ? ? Dental ? ?(+) Edentulous Upper, Edentulous Lower ?  ?Pulmonary ?asthma ,  ?  ?Pulmonary exam normal ? ? ? ? ? ? ? Cardiovascular ?Exercise Tolerance: Good ?hypertension, Normal cardiovascular exam ? ? ?  ?Neuro/Psych ?CVA (2020, on Pavix. Residual L sided weakness), Residual Symptoms   ? GI/Hepatic ?GERD  Medicated,  ?Endo/Other  ?diabetes, Poorly Controlled, Type 2 ? Renal/GU ?Renal disease (diabetic nephropathy)  ? ?  ?Musculoskeletal ? ? Abdominal ?  ?Peds ? Hematology ?  ?Anesthesia Other Findings ? ? Reproductive/Obstetrics ? ?  ? ? ? ? ? ? ? ? ? ? ? ? ? ?  ?  ? ? ? ? ? ? ? ? ?Anesthesia Physical ? ?Anesthesia Plan ? ?ASA: 3 ? ?Anesthesia Plan: MAC  ? ?Post-op Pain Management: Minimal or no pain anticipated  ? ?Induction:  ? ?PONV Risk Score and Plan: 1 and TIVA, Midazolam and Treatment may vary due to age or medical condition ? ?Airway Management Planned: Nasal Cannula and Natural Airway ? ?Additional Equipment: None ? ?Intra-op Plan:  ? ?Post-operative Plan:  ? ?Informed Consent: I have reviewed the patients History and Physical, chart, labs and discussed the procedure including the risks, benefits and alternatives for the proposed anesthesia with the patient or authorized representative who has indicated his/her understanding and acceptance.  ? ? ? ? ? ?Plan Discussed with: CRNA ? ?Anesthesia Plan Comments:   ? ? ? ? ? ? ?Anesthesia Quick Evaluation ? ?

## 2021-07-10 NOTE — Anesthesia Postprocedure Evaluation (Signed)
Anesthesia Post Note ? ?Patient: [name]. ? ?Procedure(s) Performed: CATARACT EXTRACTION PHACO AND INTRAOCULAR LENS PLACEMENT (IOC) RIGHT DIABETIC (Right: Eye) ? ? ?  ?Patient location during evaluation: PACU ?Anesthesia Type: MAC ?Level of consciousness: awake ?Pain management: pain level controlled ?Vital Signs Assessment: post-procedure vital signs reviewed and stable ?Respiratory status: respiratory function stable ?Cardiovascular status: stable ?Postop Assessment: no apparent nausea or vomiting ?Anesthetic complications: no ? ? ?No notable events documented. ? ?Veda Canning ? ? ? ? ? ?

## 2021-07-10 NOTE — Transfer of Care (Signed)
Immediate Anesthesia Transfer of Care Note ? ?Patient: [name]. ? ?Procedure(s) Performed: CATARACT EXTRACTION PHACO AND INTRAOCULAR LENS PLACEMENT (IOC) RIGHT DIABETIC (Right: Eye) ? ?Patient Location: PACU ? ?Anesthesia Type: MAC ? ?Level of Consciousness: awake, alert  and patient cooperative ? ?Airway and Oxygen Therapy: Patient Spontanous Breathing and Patient connected to supplemental oxygen ? ?Post-op Assessment: Post-op Vital signs reviewed, Patient's Cardiovascular Status Stable, Respiratory Function Stable, Patent Airway and No signs of Nausea or vomiting ? ?Post-op Vital Signs: Reviewed and stable ? ?Complications: No notable events documented. ? ?

## 2021-07-10 NOTE — H&P (Signed)
[facility]  ? ?Primary Care Physician:  [name], NP ?Ophthalmologist: Dr. Benay Pillow ? ?Pre-Procedure History & Physical: ?HPI:  [name]. is a 61 y.o. male here for cataract surgery. ?  ?Past Medical History:  ?Diagnosis Date  ? Arrhythmia   ? Asthma   ? Cyst of kidney, acquired   ? Diabetes mellitus without complication ([place]) 7096  ? type 2  ? Edentulous   ? Fatty liver   ? GERD (gastroesophageal reflux disease)   ? History of chicken pox   ? History of measles as a child   ? History of PKU   ? Hyperlipidemia   ? Hypertension   ? IBS (irritable bowel syndrome)   ? Irregular heart beat   ? Mentally challenged   ? Pancreatitis   ? Stroke ([place]) 03/02/2019  ? vision issues - right eye  ? ? ?Past Surgical History:  ?Procedure Laterality Date  ? Cardiac Catherization    ? [place]  ? CARDIAC CATHETERIZATION    ? [place]  ? CATARACT EXTRACTION W/PHACO Left 06/05/2021  ? Procedure: CATARACT EXTRACTION PHACO AND INTRAOCULAR LENS PLACEMENT (IOC) LEFT DIABETIC 3.75 00:28.0;  Surgeon: Eulogio Bear, MD;  Location: [facility];  Service: Ophthalmology;  Laterality: Left;  Diabetic  ? COLONOSCOPY    ? COLONOSCOPY WITH PROPOFOL N/A 08/10/2020  ? Procedure: COLONOSCOPY WITH PROPOFOL;  Surgeon: Lin Landsman, MD;  [facility] ENDOSCOPY;  Service: Gastroenterology;  Laterality: N/A;  Has ankle monitor; (Parole officer "Ms Andree Moro needs a time before Monday [phone]" - per patient's wife)  ? ESOPHAGOGASTRODUODENOSCOPY (EGD) WITH PROPOFOL N/A 12/27/2016  ? Procedure: ESOPHAGOGASTRODUODENOSCOPY (EGD) WITH PROPOFOL;  Surgeon: Lin Landsman, MD;  Location[facility] ENDOSCOPY;  Service: Gastroenterology;  Laterality: N/A;  ? HEMORRHOID SURGERY    ? Ligament Removal Left   ? of left thumb: dr. Cleda Mccreedy  ? ligament removal  of left thumb    ? Dr. Cleda Mccreedy  ? [place] GATED MYOCARDIAL STUDY (ARMX HX)  06/23/2014  ? [place]. Normal  ? ? ?Prior to Admission medications   ?Medication Sig Start Date  End Date Taking? Authorizing Provider  ?aspirin 81 MG EC tablet Take 1 tablet (81 mg total) by mouth daily. 03/25/19  Yes Cannady, Jolene T, NP  ?atorvastatin (LIPITOR) 80 MG tablet TAKE 1 TABLET BY MOUTH ONCE DAILY 11/18/20  Yes Cannady, Jolene T, NP  ?BREO ELLIPTA 100-25 MCG/ACT AEPB INHALE 1 PUFF BY MOUTH ONCE DAILY 02/24/21  Yes [name], NP  ?Cholecalciferol 1.25 MG (50000 UT) TABS Take 1 tablet by mouth once a week. 04/27/21  Yes Cannady, Jolene T, NP  ?clobetasol cream (TEMOVATE) 2.83 % Apply 1 application topically 2 (two) times daily. Apply to affected area 03/11/19  Yes Angiulli, Lavon Paganini, PA-C  ?clopidogrel (PLAVIX) 75 MG tablet Take 1 tablet by mouth daily. 12/25/19  Yes [provider]  ?empagliflozin (JARDIANCE) 25 MG TABS tablet Take 1 tablet (25 mg total) by mouth daily. 04/27/21  Yes Cannady, Jolene T, NP  ?fenofibrate 54 MG tablet TAKE 1 TABLET BY MOUTH ONCE DAILY 03/28/21  Yes Cannady, Jolene T, NP  ?gabapentin (NEURONTIN) 300 MG capsule TAKE 1 CAPSULE BY MOUTH TWICE DAILY 03/28/21  Yes Cannady, Jolene T, NP  ?insulin degludec (TRESIBA FLEXTOUCH) 100 UNIT/ML FlexTouch Pen Take 40 units into the skin in morning and 40 units into skin every evening. 04/26/21  Yes Cannady, Jolene T, NP  ?insulin lispro (HUMALOG KWIKPEN) 100 UNIT/ML KwikPen Inject 5  Units into the skin 3 (three) times daily. Prior to breakfast, lunch, and dinner. 04/26/21  Yes Cannady, Jolene T, NP  ?lisinopril (ZESTRIL) 20 MG tablet TAKE 1 TABLET BY MOUTH ONCE DAILY 03/28/21  Yes Cannady, Jolene T, NP  ?montelukast (SINGULAIR) 10 MG tablet TAKE 1 TABLET BY MOUTH AT BEDTIME 02/24/21  Yes Cannady, Jolene T, NP  ?omeprazole (PRILOSEC) 20 MG capsule TAKE 1 CAPSULE BY MOUTH ONCE DAILY 03/28/21  Yes Cannady, Jolene T, NP  ?tiZANidine (ZANAFLEX) 2 MG tablet Take 1 tablet (2 mg total) by mouth every 6 (six) hours as needed for muscle spasms. 04/28/21  Yes Cannady, Henrine Screws T, NP  ?trimethoprim-polymyxin b (POLYTRIM) ophthalmic solution Place 1  drop into the right eye every 6 (six) hours.  07/07/19  Yes [provider]  ?Blood Glucose Monitoring Suppl (ONE TOUCH ULTRA 2) w/Device KIT Use to check blood sugar 4 times a day 06/26/21   Cannady, Jolene T, NP  ?EPINEPHRINE 0.3 mg/0.3 mL IJ SOAJ injection INJECT 1 SYRINGE INTO OUTER THIGH ONCE AS NEEDED FOR SEVERE ALLERGIC REACTION. 01/29/21   Cannady, Henrine Screws T, NP  ?glucose blood (ONETOUCH ULTRA) test strip USE TO CHECK BLOOD SUGAR 3 TIMES A DAY AS DIRECTED 06/26/21   [name], NP  ?Insulin Pen Needle (PEN NEEDLES) 32G X 4 MM MISC 1 kit by Does not apply route daily. 09/07/19   [name], NP  ?Lancets (ONETOUCH DELICA PLUS [id number]) [place] USE AS DIRECTED. 06/26/21   [name], NP  ?Misc. Devices (PULSE OXIMETER FOR FINGER) MISC To check O2 saturations once daily with asthma and document + check if any shortness of breath 06/01/20   Cannady, Henrine Screws T, NP  ?nitroGLYCERIN (NITROSTAT) 0.4 MG SL tablet Place 1 tablet (0.4 mg total) under the tongue every 5 (five) minutes as needed for chest pain. 08/27/17   Birdie Sons, MD  ?Irven Easterly PEN NEEDLES 32G X 5 MM MISC  05/25/20   [provider]  ?vitamin B-12 (CYANOCOBALAMIN) 1000 MCG tablet Take 1 tablet (1,000 mcg total) by mouth daily. ?Patient not taking: Reported on 05/31/2021 04/27/21   [name], NP  ? ? ?Allergies as of 05/29/2021 - Review Complete 05/24/2021  ?Allergen Reaction Noted  ? Bee venom Hives 10/22/2014  ? Other  10/09/2014  ? Sulfa antibiotics  01/04/2014  ? ? ?Family History  ?Problem Relation Age of Onset  ? Cancer Mother   ?     throat  ? Diabetes Father   ? Heart disease Father   ? Cancer Maternal Grandmother   ? Cancer Maternal Grandfather   ?     pancreatic  ? Cancer Paternal Grandfather   ? ? ?Social History  ? ?Socioeconomic History  ? Marital status: Married  ?  Spouse name: Not on file  ? Number of children: 0  ? Years of education: Not on file  ? Highest education level: 12th grade  ?Occupational  History  ? Occupation: Disabled  ?Tobacco Use  ? Smoking status: Never  ? Smokeless tobacco: Never  ?Vaping Use  ? Vaping Use: Never used  ?Substance and Sexual Activity  ? Alcohol use: No  ?  Alcohol/week: 0.0 standard drinks  ? Drug use: No  ? Sexual activity: Not Currently  ?Other Topics Concern  ? Not on file  ?Social History Narrative  ? ** Merged History Encounter **  ?    ? ** Data from: 10/22/14 Enc Dept: [facility]  ?    ? **  Data from: 10/07/14 Enc Dept: [facility]  ? Arrest: Arrested in 2012 for indecent liberties, required to wear ankle monitor  ? ?Social Determinants of Health  ? ?Financial Resource Strain: Low Risk   ? Difficulty of Paying Living Expenses: Not very hard  ?Food Insecurity: No Food Insecurity  ? Worried About Charity fundraiser in the Last Year: Never true  ? Ran Out of Food in the Last Year: Never true  ?Transportation Needs: No Transportation Needs  ? Lack of Transportation (Medical): No  ? Lack of Transportation (Non-Medical): No  ?Physical Activity: Inactive  ? Days of Exercise per Week: 0 days  ? Minutes of Exercise per Session: 0 min  ?Stress: No Stress Concern Present  ? Feeling of Stress : Only a little  ?Social Connections: Moderately Integrated  ? Frequency of Communication with Friends and Family: Twice a week  ? Frequency of Social Gatherings with Friends and Family: Once a week  ? Attends Religious Services: More than 4 times per year  ? Active Member of Clubs or Organizations: No  ? Attends Archivist Meetings: Never  ? Marital Status: Married  ?Intimate Partner Violence: Not At Risk  ? Fear of Current or Ex-Partner: No  ? Emotionally Abused: No  ? Physically Abused: No  ? Sexually Abused: No  ? ? ?Review of Systems: ?See HPI, otherwise negative ROS ? ?Physical Exam: ?BP 105/75   Pulse 60   Temp (!) 97.5 ?F (36.4 ?C) (Temporal)   Resp 12   Ht 5' 2" (1.575 m)   Wt 80.3 kg   SpO2 97%   BMI 32.37 kg/m?  ?General:   Alert, cooperative in  NAD ?Head:  Normocephalic and atraumatic. ?Respiratory:  Normal work of breathing. ?Cardiovascular:  RRR ? ?Impression/Plan: ?[name]. is here for cataract surgery. ? ?Risks, benefits, limitations,

## 2021-07-10 NOTE — Anesthesia Procedure Notes (Signed)
Procedure Name: [place] ?Date/Time: 07/10/2021 7:40 AM ?Performed by: Jeannene Patella, CRNA ?Pre-anesthesia Checklist: Patient identified, Emergency Drugs available, Suction available, Timeout performed and Patient being monitored ?Patient Re-evaluated:Patient Re-evaluated prior to induction ?Oxygen Delivery Method: Nasal cannula ?Placement Confirmation: positive ETCO2 ? ? ? ? ?

## 2021-07-11 ENCOUNTER — Encounter: Payer: Self-pay | Admitting: Ophthalmology

## 2021-07-18 ENCOUNTER — Other Ambulatory Visit: Payer: Self-pay | Admitting: Nurse Practitioner

## 2021-07-18 MED ORDER — ONETOUCH ULTRA VI STRP: 1.0000 | ORAL_STRIP | Freq: Every day | 3 refills | Status: DC

## 2021-07-18 NOTE — Telephone Encounter (Signed)
Copied from [place] [phone]. Topic: General - Other ?>> Jul 18, 2021 11:59 AM Leward Quan A wrote: ?Reason for CRM: Patient called in to inform [facility] that he will need a new Rx for test strips since he test his blood sugar maybe 4 or 5 times some day due to change in his numbers when he eats. Asking for a new Rx to be sent to the pharmacy ASAP any questions please call patient  Ph#  [phone] ?

## 2021-07-18 NOTE — Telephone Encounter (Signed)
RX started and pended below.  ?

## 2021-07-23 NOTE — Progress Notes (Signed)
? ? ? ? ?MRN : [id number] ? ?[name]. is a 61 y.o. (Sep 13, 1960) male who presents with chief complaint of check carotid arteries. ? ?History of Present Illness:  ? ?The patient is seen for evaluation of carotid stenosis. The carotid stenosis was identified remotely.  He did have a carotid duplex about 2 years ago which showed mild to moderate stenosis ? ?The patient denies amaurosis fugax. There is no recent history of TIA symptoms or focal motor deficits. There is a prior documented CVA which was "mild". ? ?There is no history of migraine headaches. There is no history of seizures. ? ?The patient is taking enteric-coated aspirin 81 mg daily. ? ?No recent shortening of the patient's walking distance or new symptoms consistent with claudication.  No history of rest pain symptoms. No new ulcers or wounds of the lower extremities have occurred. ? ?There is no history of DVT, PE or superficial thrombophlebitis. ?No recent episodes of angina or shortness of breath documented.   ? ?No outpatient medications have been marked as taking for the 07/24/21 encounter (Appointment) with Delana Meyer, Dolores Lory, MD.  ? ? ?Past Medical History:  ?Diagnosis Date  ? Arrhythmia   ? Asthma   ? Cyst of kidney, acquired   ? Diabetes mellitus without complication ([place]) 0737  ? type 2  ? Edentulous   ? Fatty liver   ? GERD (gastroesophageal reflux disease)   ? History of chicken pox   ? History of measles as a child   ? History of PKU   ? Hyperlipidemia   ? Hypertension   ? IBS (irritable bowel syndrome)   ? Irregular heart beat   ? Mentally challenged   ? Pancreatitis   ? Stroke ([place]) 03/02/2019  ? vision issues - right eye  ? ? ?Past Surgical History:  ?Procedure Laterality Date  ? Cardiac Catherization    ? [place]  ? CARDIAC CATHETERIZATION    ? [place]  ? CATARACT EXTRACTION W/PHACO Left 06/05/2021  ? Procedure: CATARACT EXTRACTION PHACO AND INTRAOCULAR LENS PLACEMENT (IOC) LEFT DIABETIC 3.75 00:28.0;  Surgeon: Eulogio Bear, MD;   Location: [place];  Service: Ophthalmology;  Laterality: Left;  Diabetic  ? CATARACT EXTRACTION W/PHACO Right 07/10/2021  ? Procedure: CATARACT EXTRACTION PHACO AND INTRAOCULAR LENS PLACEMENT ([place]) RIGHT DIABETIC;  Surgeon: Eulogio Bear, MD;  Location: [place];  Service: Ophthalmology;  Laterality: Right;  Diabetic ?2.66 ?00:24.4  ? COLONOSCOPY    ? COLONOSCOPY WITH PROPOFOL N/A 08/10/2020  ? Procedure: COLONOSCOPY WITH PROPOFOL;  Surgeon: Lin Landsman, MD;  Location: [facility] ENDOSCOPY;  Service: Gastroenterology;  Laterality: N/A;  Has ankle monitor; (Parole officer "Ms Andree Moro needs a time before Monday [phone]" - per patient's wife)  ? ESOPHAGOGASTRODUODENOSCOPY (EGD) WITH PROPOFOL N/A 12/27/2016  ? Procedure: ESOPHAGOGASTRODUODENOSCOPY (EGD) WITH PROPOFOL;  Surgeon: Lin Landsman, MD;  Location: [facility] ENDOSCOPY;  Service: Gastroenterology;  Laterality: N/A;  ? HEMORRHOID SURGERY    ? Ligament Removal Left   ? of left thumb: dr. Cleda Mccreedy  ? ligament removal  of left thumb    ? Dr. Cleda Mccreedy  ? NM GATED MYOCARDIAL STUDY (ARMX HX)  06/23/2014  ? Paras[place]. Normal  ? ? ?Social History ?Social History  ? ?Tobacco Use  ? Smoking status: Never  ? Smokeless tobacco: Never  ?Vaping Use  ? Vaping Use: Never used  ?Substance Use Topics  ? Alcohol use: No  ?  Alcohol/week: 0.0 standard drinks  ? Drug use: No  ? ? ?  Family History ?Family History  ?Problem Relation Age of Onset  ? Cancer Mother   ?     throat  ? Diabetes Father   ? Heart disease Father   ? Cancer Maternal Grandmother   ? Cancer Maternal Grandfather   ?     pancreatic  ? Cancer Paternal Grandfather   ? ? ?Allergies  ?Allergen Reactions  ? Bee Venom Hives  ?  All kinds of bees  ? Other   ?  Certain powders  ? Sulfa Antibiotics   ? ? ? ?REVIEW OF SYSTEMS (Negative unless checked) ? ?Constitutional: _0 Weight loss  _1 Fever  _2 Chills ?Cardiac: _3 Chest pain   _4 Chest pressure   _5 Palpitations   _6 Shortness of breath when laying  flat   _7 Shortness of breath with exertion. ?Vascular:  _8 Pain in legs with walking   _9 Pain in legs at rest  _10 History of DVT   _11 Phlebitis   _12 Swelling in legs   _13 Varicose veins   _14 Non-healing ulcers ?Pulmonary:   _15 Uses home oxygen   _16 Productive cough   _17 Hemoptysis   _18 Wheeze  _19 COPD   _20 Asthma ?Neurologic:  _21 Dizziness   _22 Seizures   _23 History of stroke   _24 History of TIA  _25 Aphasia   _26 Vissual changes   _27 Weakness or numbness in arm   _28 Weakness or numbness in leg ?Musculoskeletal:   _29 Joint swelling   _30 Joint pain   _31 Low back pain ?Hematologic:  _32 Easy bruising  _33 Easy bleeding   _34 Hypercoagulable state   _35 Anemic ?Gastrointestinal:  _36 Diarrhea   _37 Vomiting  _38 Gastroesophageal reflux/heartburn   _39 Difficulty swallowing. ?Genitourinary:  _40 Chronic kidney disease   _41 Difficult urination  _42 Frequent urination   _43 Blood in urine ?Skin:  _44 Rashes   _45 Ulcers  ?Psychological:  _46 History of anxiety   _47  History of major depression. ? ?Physical Examination ? ?There were no vitals filed for this visit. ?There is no height or weight on file to calculate BMI. ?Gen: WD/WN, NAD ?Head: [place]/AT, No temporalis wasting.  ?Ear/Nose/Throat: Hearing grossly intact, nares w/o erythema or drainage ?Eyes: PER, EOMI, sclera nonicteric.  ?Neck: Supple, no masses.  No bruit or JVD.  ?Pulmonary:  Good air movement, no audible wheezing, no use of accessory muscles.  ?Cardiac: RRR, normal S1, S2, no Murmurs. ?Vascular:  carotid bruit noted ?Vessel Right Left  ?Radial Palpable Palpable  ?Carotid  Palpable  Palpable  ?Subclav  Palpable Palpable  ?Gastrointestinal: soft, non-distended. No guarding/no peritoneal signs.  ?Musculoskeletal: M/S 5/5 throughout.  No visible deformity.  ?Neurologic: CN 2-12 intact. Pain and light touch intact in extremities.  Symmetrical.  Speech is fluent. Motor exam as listed above. ?Psychiatric: Judgment intact, Mood & affect appropriate for pt's clinical situation. ?Dermatologic: No rashes or ulcers  noted.  No changes consistent with cellulitis. ? ? ?CBC ?Lab Results  ?Component Value Date  ? WBC 4.8 04/26/2021  ? HGB 14.6 04/26/2021  ? HCT 43.9 04/26/2021  ? MCV 92 04/26/2021  ? PLT 174 04/26/2021  ? ? ?BMET ?   ?Component Value Date/Time  ? NA 141 04/26/2021 1425  ? K 4.2 04/26/2021 1425  ? CL 107 (H) 04/26/2021 1425  ? CO2 19 (L) 04/26/2021 1425  ? GLUCOSE 145 (H) 04/26/2021 1425  ? GLUCOSE 157 (H) 03/09/2019 0740  ? BUN 24 04/26/2021 1425  ? CREATININE 1.44 (H) 04/26/2021 1425  ? CALCIUM 9.2 04/26/2021 1425  ? GFRNONAA 66 02/24/2020 1622  ? GFRAA 76 02/24/2020 1622  ? ?CrCl cannot be calculated (Patient's most recent lab result is older than the maximum 21  days allowed.). ? ?COAG ?Lab Results  ?Component Value Date  ? INR 1.0 03/02/2019  ? ? ?Radiology ?No results found. ? ? ?Assessment/Plan ?1. Atherosclerosis of both carotid arteries ?Recommend: ? ?Given the patient's history of symptomatic subcritical stenosis he should have a duplex ultrasound. ? ?Continue antiplatelet therapy as prescribed ?Continue management of CAD, HTN and Hyperlipidemia ?Healthy heart diet,  encouraged exercise at least 4 times per week ?Follow up in 1 months with duplex ultrasound and physical exam   ?- VAS US CAROTID; Future ? ?2. Hypertension associated with diabetes ([place]) ?Continue antihypertensive medications as already ordered, these medications have been reviewed and there are no changes at this time.  ? ?3. Moderate persistent asthma without complication ?Continue pulmonary medications and aerosols as already ordered, these medications have been reviewed and there are no changes at this time. ?  ? ?4. Gastroesophageal reflux disease, unspecified whether esophagitis present ?Continue PPI as already ordered, this medication has been reviewed and there are no changes at this time. ? ?Avoidence of caffeine and alcohol ? ?Moderate elevation of the head of the bed   ? ?5. Type 2 diabetes mellitus with proteinuria (HCC) ?Continue  hypoglycemic medications as already ordered, these medications have been reviewed and there are no changes at this time. ? ?Hgb A1C to be monitored as already arranged by primary service  ? ? ? ?Dalbert Mayotte

## 2021-07-23 NOTE — Patient Instructions (Addendum)
Increase Tresiba to 45 units in morning and at night.  Increase Humalog to 8 units before each meal. ? ?Diabetes Mellitus and Nutrition, Adult ?When you have diabetes, or diabetes mellitus, it is very important to have healthy eating habits because your blood sugar (glucose) levels are greatly affected by what you eat and drink. Eating healthy foods in the right amounts, at about the same times every day, can help you: ?Manage your blood glucose. ?Lower your risk of heart disease. ?Improve your blood pressure. ?Reach or maintain a healthy weight. ?What can affect my meal plan? ?Every person with diabetes is different, and each person has different needs for a meal plan. Your health care provider may recommend that you work with a dietitian to make a meal plan that is best for you. Your meal plan may vary depending on factors such as: ?The calories you need. ?The medicines you take. ?Your weight. ?Your blood glucose, blood pressure, and cholesterol levels. ?Your activity level. ?Other health conditions you have, such as heart or kidney disease. ?How do carbohydrates affect me? ?Carbohydrates, also called carbs, affect your blood glucose level more than any other type of food. Eating carbs raises the amount of glucose in your blood. ?It is important to know how many carbs you can safely have in each meal. This is different for every person. Your dietitian can help you calculate how many carbs you should have at each meal and for each snack. ?How does alcohol affect me? ?Alcohol can cause a decrease in blood glucose (hypoglycemia), especially if you use insulin or take certain diabetes medicines by mouth. Hypoglycemia can be a life-threatening condition. Symptoms of hypoglycemia, such as sleepiness, dizziness, and confusion, are similar to symptoms of having too much alcohol. ?Do not drink alcohol if: ?Your health care provider tells you not to drink. ?You are pregnant, may be pregnant, or are planning to become  pregnant. ?If you drink alcohol: ?Limit how much you have to: ?0-1 drink a day for women. ?0-2 drinks a day for men. ?Know how much alcohol is in your drink. In the [place] one drink equals one 12 oz bottle of beer (355 mL), one 5 oz glass of wine (148 mL), or one 1? oz glass of hard liquor (44 mL). ?Keep yourself hydrated with water, diet soda, or unsweetened iced tea. Keep in mind that regular soda, juice, and other mixers may contain a lot of sugar and must be counted as carbs. ?What are tips for following this plan? ? ?Reading food labels ?Start by checking the serving size on the Nutrition Facts label of packaged foods and drinks. The number of calories and the amount of carbs, fats, and other nutrients listed on the label are based on one serving of the item. Many items contain more than one serving per package. ?Check the total grams (g) of carbs in one serving. ?Check the number of grams of saturated fats and trans fats in one serving. Choose foods that have a low amount or none of these fats. ?Check the number of milligrams (mg) of salt (sodium) in one serving. Most people should limit total sodium intake to less than 2,300 mg per day. ?Always check the nutrition information of foods labeled as "low-fat" or "nonfat." These foods may be higher in added sugar or refined carbs and should be avoided. ?Talk to your dietitian to identify your daily goals for nutrients listed on the label. ?Shopping ?Avoid buying canned, pre-made, or processed foods. These foods tend to be  high in fat, sodium, and added sugar. ?Shop around the outside edge of the grocery store. This is where you will most often find fresh fruits and vegetables, bulk grains, fresh meats, and fresh dairy products. ?Cooking ?Use low-heat cooking methods, such as baking, instead of high-heat cooking methods, such as deep frying. ?Cook using healthy oils, such as olive, canola, or sunflower oil. ?Avoid cooking with butter, cream, or high-fat meats. ?Meal  planning ?Eat meals and snacks regularly, preferably at the same times every day. Avoid going long periods of time without eating. ?Eat foods that are high in fiber, such as fresh fruits, vegetables, beans, and whole grains. ?Eat 4-6 oz (112-168 g) of lean protein each day, such as lean meat, chicken, fish, eggs, or tofu. One ounce (oz) (28 g) of lean protein is equal to: ?1 oz (28 g) of meat, chicken, or fish. ?1 egg. ?? cup (62 g) of tofu. ?Eat some foods each day that contain healthy fats, such as avocado, nuts, seeds, and fish. ?What foods should I eat? ?Fruits ?Berries. Apples. Oranges. Peaches. Apricots. Plums. Grapes. Mangoes. Papayas. Pomegranates. Kiwi. Cherries. ?Vegetables ?Leafy greens, including lettuce, spinach, kale, chard, collard greens, mustard greens, and cabbage. Beets. Cauliflower. Broccoli. Carrots. Green beans. Tomatoes. Peppers. Onions. Cucumbers. Brussels sprouts. ?Grains ?Whole grains, such as whole-wheat or whole-grain bread, crackers, tortillas, cereal, and pasta. Unsweetened oatmeal. Quinoa. Brown or wild rice. ?Meats and other proteins ?Seafood. Poultry without skin. Lean cuts of poultry and beef. Tofu. Nuts. Seeds. ?Dairy ?Low-fat or fat-free dairy products such as milk, yogurt, and cheese. ?The items listed above may not be a complete list of foods and beverages you can eat and drink. Contact a dietitian for more information. ?What foods should I avoid? ?Fruits ?Fruits canned with syrup. ?Vegetables ?Canned vegetables. Frozen vegetables with butter or cream sauce. ?Grains ?Refined white flour and flour products such as bread, pasta, snack foods, and cereals. Avoid all processed foods. ?Meats and other proteins ?Fatty cuts of meat. Poultry with skin. Breaded or fried meats. Processed meat. Avoid saturated fats. ?Dairy ?Full-fat yogurt, cheese, or milk. ?Beverages ?Sweetened drinks, such as soda or iced tea. ?The items listed above may not be a complete list of foods and beverages you  should avoid. Contact a dietitian for more information. ?Questions to ask a health care provider ?Do I need to meet with a certified diabetes care and education specialist? ?Do I need to meet with a dietitian? ?What number can I call if I have questions? ?When are the best times to check my blood glucose? ?Where to find more information: ?American Diabetes Association: diabetes.org ?Academy of Nutrition and Dietetics: eatright.org ?[employer] of Diabetes and Digestive and Kidney Diseases: AmenCredit.is ?Association of Diabetes Care & Education Specialists: diabeteseducator.org ?Summary ?It is important to have healthy eating habits because your blood sugar (glucose) levels are greatly affected by what you eat and drink. It is important to use alcohol carefully. ?A healthy meal plan will help you manage your blood glucose and lower your risk of heart disease. ?Your health care provider may recommend that you work with a dietitian to make a meal plan that is best for you. ?This information is not intended to replace advice given to you by your health care provider. Make sure you discuss any questions you have with your health care provider. ?Document Revised: 10/07/2019 Document Reviewed: 10/07/2019 ?Elsevier Patient Education ? [place]. ? ?

## 2021-07-24 ENCOUNTER — Ambulatory Visit (INDEPENDENT_AMBULATORY_CARE_PROVIDER_SITE_OTHER): Payer: Medicare Other | Admitting: Vascular Surgery

## 2021-07-24 ENCOUNTER — Encounter (INDEPENDENT_AMBULATORY_CARE_PROVIDER_SITE_OTHER): Payer: Self-pay | Admitting: Vascular Surgery

## 2021-07-24 VITALS — BP 153/77 | HR 54 | Resp 18 | Ht 64.0 in

## 2021-07-24 DIAGNOSIS — I6523 Occlusion and stenosis of bilateral carotid arteries: Secondary | ICD-10-CM

## 2021-07-24 DIAGNOSIS — I152 Hypertension secondary to endocrine disorders: Secondary | ICD-10-CM

## 2021-07-24 DIAGNOSIS — K219 Gastro-esophageal reflux disease without esophagitis: Secondary | ICD-10-CM

## 2021-07-24 DIAGNOSIS — E1129 Type 2 diabetes mellitus with other diabetic kidney complication: Secondary | ICD-10-CM

## 2021-07-24 DIAGNOSIS — R809 Proteinuria, unspecified: Secondary | ICD-10-CM

## 2021-07-24 DIAGNOSIS — E1159 Type 2 diabetes mellitus with other circulatory complications: Secondary | ICD-10-CM | POA: Diagnosis not present

## 2021-07-24 DIAGNOSIS — J4540 Moderate persistent asthma, uncomplicated: Secondary | ICD-10-CM | POA: Diagnosis not present

## 2021-07-26 ENCOUNTER — Telehealth: Payer: Medicare Other

## 2021-07-26 ENCOUNTER — Ambulatory Visit (INDEPENDENT_AMBULATORY_CARE_PROVIDER_SITE_OTHER): Payer: Medicare Other | Admitting: Nurse Practitioner

## 2021-07-26 ENCOUNTER — Encounter: Payer: Self-pay | Admitting: Nurse Practitioner

## 2021-07-26 VITALS — BP 136/80 | HR 56 | Temp 98.3°F | Ht 64.0 in | Wt 178.2 lb

## 2021-07-26 DIAGNOSIS — I152 Hypertension secondary to endocrine disorders: Secondary | ICD-10-CM | POA: Diagnosis not present

## 2021-07-26 DIAGNOSIS — I6523 Occlusion and stenosis of bilateral carotid arteries: Secondary | ICD-10-CM

## 2021-07-26 DIAGNOSIS — E6609 Other obesity due to excess calories: Secondary | ICD-10-CM

## 2021-07-26 DIAGNOSIS — R809 Proteinuria, unspecified: Secondary | ICD-10-CM | POA: Diagnosis not present

## 2021-07-26 DIAGNOSIS — Z8673 Personal history of transient ischemic attack (TIA), and cerebral infarction without residual deficits: Secondary | ICD-10-CM

## 2021-07-26 DIAGNOSIS — E1165 Type 2 diabetes mellitus with hyperglycemia: Secondary | ICD-10-CM | POA: Diagnosis not present

## 2021-07-26 DIAGNOSIS — E1129 Type 2 diabetes mellitus with other diabetic kidney complication: Secondary | ICD-10-CM

## 2021-07-26 DIAGNOSIS — R011 Cardiac murmur, unspecified: Secondary | ICD-10-CM

## 2021-07-26 DIAGNOSIS — Z6832 Body mass index (BMI) 32.0-32.9, adult: Secondary | ICD-10-CM

## 2021-07-26 DIAGNOSIS — E1159 Type 2 diabetes mellitus with other circulatory complications: Secondary | ICD-10-CM

## 2021-07-26 DIAGNOSIS — E701 Other hyperphenylalaninemias: Secondary | ICD-10-CM

## 2021-07-26 DIAGNOSIS — K219 Gastro-esophageal reflux disease without esophagitis: Secondary | ICD-10-CM | POA: Diagnosis not present

## 2021-07-26 DIAGNOSIS — R21 Rash and other nonspecific skin eruption: Secondary | ICD-10-CM

## 2021-07-26 DIAGNOSIS — E1169 Type 2 diabetes mellitus with other specified complication: Secondary | ICD-10-CM

## 2021-07-26 DIAGNOSIS — E785 Hyperlipidemia, unspecified: Secondary | ICD-10-CM

## 2021-07-26 DIAGNOSIS — G5602 Carpal tunnel syndrome, left upper limb: Secondary | ICD-10-CM | POA: Diagnosis not present

## 2021-07-26 LAB — BAYER DCA HB A1C WAIVED: HB A1C (BAYER DCA - WAIVED): 8.8 % — ABNORMAL HIGH (ref 4.8–5.6)

## 2021-07-26 MED ORDER — TRIAMCINOLONE ACETONIDE 0.1 % EX CREA: 1.0000 "application " | TOPICAL_CREAM | Freq: Two times a day (BID) | CUTANEOUS | 1 refills | Status: DC

## 2021-07-26 MED ORDER — INSULIN LISPRO (1 UNIT DIAL) 100 UNIT/ML (KWIKPEN): 8.0000 [IU] | PEN_INJECTOR | Freq: Three times a day (TID) | SUBCUTANEOUS | 4 refills | Status: DC

## 2021-07-26 MED ORDER — TRESIBA FLEXTOUCH 100 UNIT/ML ~~LOC~~ SOPN: PEN_INJECTOR | SUBCUTANEOUS | 4 refills | Status: DC

## 2021-07-26 NOTE — Assessment & Plan Note (Signed)
BMI 30.59.  Recommended eating smaller high protein, low fat meals more frequently and exercising 30 mins a day 5 times a week with a goal of 10-15lb weight loss in the next 3 months. Patient voiced their understanding and motivation to adhere to these recommendations. ? ?

## 2021-07-26 NOTE — Progress Notes (Signed)
? ?BP 136/80 (BP Location: Left Arm, Cuff Size: Normal)   Pulse (!) 56   Temp 98.3 ?F (36.8 ?C) (Oral)   Ht 5' 4" (1.626 m)   Wt 178 lb 3.2 oz (80.8 kg)   SpO2 95%   BMI 30.59 kg/m?   ? ?Subjective:  ? ? Patient ID: Alex Metro., male    DOB: Jan 01, 1961, 61 y.o.   MRN: [id number] ? ?HPI: ?Alex Morones. is a 61 y.o. male ? ?Chief Complaint  ?Patient presents with  ? Hyperlipidemia  ? Diabetes  ? Hypertension  ? Gastroesophageal Reflux  ? Vitamin B12  ? Vitamin D  ? Hernia  ?  Patient states he has discussed in the past with provider about taking care of his diagnosis of hernias. Patient states the provider informed him that she wanted to wait after patient has his cataract surgeries and then discuss afterwards. Patient states the pain associated with the hernias has gotten worse and would like to discuss at today's visit.  ? Rash  ?  Patient states he notices some "whelps" on his back and his wife noticed them about 3 weeks ago. Patient states he did mow someone else's yard and his wife. Patient states his wife rubbed some cream on the area. Patient says it itches a little bit.   ? ?DIABETES & PHENYLKETONURIA ?Last visit A1c in February was 10.2% -- 04/26/21.  Continues Jardiance and Tresiba 40 units BID and started Humalog 5 units before meals.  He is working on diet changes with his wife.  Has not heard from endocrinology -- referral placed last visit.   ?Hypoglycemic episodes:no ?Polydipsia/polyuria: no ?Visual disturbance: no ?Chest pain: no ?Paresthesias: no ?Glucose Monitoring: yes ? Accucheck frequency: TID ? Fasting glucose: 100 to 140 ? Post prandial: 130 to 200 ? Evening: 110 to 200 ? Before meals: ?Taking Insulin?: yes ? Long acting insulin: Tresiba 40 units BID ? Short acting insulin: Humalog 5 units before meals ?Blood Pressure Monitoring: not checking ?Retinal Examination: Up to Date ?Foot Exam: Up to Date ?Diabetic Education: Not Completed ?Pneumovax: Up to Date ?Influenza: Up to  Date ?Aspirin: yes  ? 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 Sees Dr. Humphrey Rolls for cardiology, has not seen in awhile -- reports recent EKG with him + has ordered Stress Test.  Had initial visit with Dr. Melrose Nakayama on 11/09/19 -- he is to continue Plavix and ASA per their note.  Saw vascular on 07/24/21 -- is going to get carotid doppler. ?  ?Current medications Lisinopril, Atorvastatin, Fenofibrate, NTG.   ?Satisfied with current treatment? yes ?Duration of hypertension: chronic ?BP monitoring frequency: daily ?BP range:  ?BP medication side effects: no ?Duration of hyperlipidemia: chronic ?Cholesterol medication side effects: no ?Cholesterol supplements: none ?Medication compliance: good compliance ?Aspirin: yes ?Recent stressors: no ?Recurrent headaches: no ?Visual changes: no ?Palpitations: no ?Dyspnea: occasional ?Chest pain: no ?Lower extremity edema: no ?Dizzy/lightheaded: no ?  ?GERD & HERNIA ?Continues on Omeprazole 20 MG daily.  He is worried about hernia, but recent imaging noted no hernias -- he reports he was sure he had a hernia, did note one small gallstone.  He reports being upset there is no hernia as was wanting to get surgery if hernia there. ?GERD control status: stable ?Satisfied with current treatment? yes ?Heartburn frequency: occasional ?Medication side effects: no  ?Medication compliance: stable ?Previous GERD medications: ?Antacid use frequency:  none ?Dysphagia: no ?Odynophagia:  no ?Hematemesis: no ?Blood in stool: no ?EGD: yes  ? ?  RASH ?States this has been present for 3 weeks after mowing a neighbors lawn.  His wife has been putting cream on this, but not helping -- Clobetasol.   ?Duration:  weeks  ?Location: generalized and arms  ?Itching: yes ?Burning: no ?Redness: yes ?Oozing: no ?Scaling: no ?Blisters: no ?Painful: no ?Fevers: no ?Change in detergents/soaps/personal care products: no ?Recent illness: no ?Recent travel:no ?History of same: no ?Context:  stable ?Alleviating factors: Clobetasol ?Treatments attempted: as above ?Shortness of breath: no  ?Throat/tongue swelling: no ?Myalgias/arthralgias: no ? ?WRIST PAIN  ?To left wrist -- history of carpal tunnel to this side and surgery in past.  Ongoing pain for 2 days. ?Duration: weeks ?Involved wrist: left ?Mechanism of injury:  unknown ?Location: diffuse ?Onset: gradual ?Severity: 6/10  ?Quality:  sharp, aching, and throbbing ?Frequency: intermittent ?Radiation: no ?Aggravating factors: movement and gripping  ?Alleviating factors: ice, APAP, NSAIDs, and rest  ?Status: fluctuating ?Treatments attempted: as above    ?Relief with NSAIDs?:  no ?Weakness: yes ?Numbness: yes ulnar nerve distribution ?Redness: no ?Bruising: no ?Swelling: no ?Fevers: no   ? ?Relevant past medical, surgical, family and social history reviewed and updated as indicated. Interim medical history since our last visit reviewed. ?Allergies and medications reviewed and updated. ? ?Review of Systems  ?Constitutional:  Negative for activity change, diaphoresis, fatigue and fever.  ?Respiratory:  Negative for cough, chest tightness, shortness of breath and wheezing.   ?Cardiovascular:  Negative for chest pain, palpitations and leg swelling.  ?Gastrointestinal: Negative.   ?Endocrine: Negative for cold intolerance, heat intolerance, polydipsia, polyphagia and polyuria.  ?Musculoskeletal:  Positive for arthralgias.  ?Skin:  Positive for rash.  ?Neurological: Negative.   ?Psychiatric/Behavioral: Negative.    ? ?Per HPI unless specifically indicated above ? ?   ?Objective:  ?  ?BP 136/80 (BP Location: Left Arm, Cuff Size: Normal)   Pulse (!) 56   Temp 98.3 ?F (36.8 ?C) (Oral)   Ht 5' 4" (1.626 m)   Wt 178 lb 3.2 oz (80.8 kg)   SpO2 95%   BMI 30.59 kg/m?   ?Wt Readings from Last 3 Encounters:  ?07/26/21 178 lb 3.2 oz (80.8 kg)  ?07/10/21 177 lb (80.3 kg)  ?06/05/21 178 lb (80.7 kg)  ?  ?Physical Exam ?Vitals and nursing note reviewed.   ?Constitutional:   ?   General: He is awake. He is not in acute distress. ?   Appearance: He is well-developed. He is obese. He is not ill-appearing.  ?HENT:  ?   Head: Normocephalic and atraumatic.  ?   Right Ear: Hearing normal. No drainage.  ?   Left Ear: Hearing normal. No drainage.  ?Eyes:  ?   General: Lids are normal.     ?   Right eye: No discharge.     ?   Left eye: No discharge.  ?   Conjunctiva/sclera: Conjunctivae normal.  ?   Pupils: Pupils are equal, round, and reactive to light.  ?Neck:  ?   Thyroid: No thyromegaly.  ?   Vascular: Carotid bruit (R>L) present.  ?Cardiovascular:  ?   Rate and Rhythm: Normal rate and regular rhythm.  ?   Heart sounds: S1 normal and S2 normal. Murmur heard.  ?Systolic murmur is present with a grade of 2/6.  ?  No gallop.  ?   Comments: Systolic murmur noted best at left sternal border, soft. ?Pulmonary:  ?   Effort: Pulmonary effort is normal. No accessory muscle usage or respiratory distress.  ?  Breath sounds: Normal breath sounds.  ?Abdominal:  ?   General: Bowel sounds are normal.  ?   Palpations: Abdomen is soft.  ?   Comments: Abdomen soft, although with exam patient continuously pushing out his muscles and stating "doesn't it feel hard".  ?Musculoskeletal:  ?   Right wrist: Normal.  ?   Left wrist: Tenderness present. No swelling, lacerations, bony tenderness, snuff box tenderness or crepitus. Decreased range of motion. Normal pulse.  ?   Cervical back: Normal range of motion and neck supple.  ?   Right lower leg: No edema.  ?   Left lower leg: No edema.  ?   Comments: Discomfort with [place] reported, no issues with [place].  ?Skin: ?   General: Skin is warm and dry.  ?   Capillary Refill: Capillary refill takes less than 2 seconds.  ?   Findings: Rash present.  ?   Comments: Small, round areas of erythema, flat with scaling around edges - two to back and one to left arm.  ?Neurological:  ?   Mental Status: He is alert and oriented to person, place, and time.  ?    Deep Tendon Reflexes: Reflexes are normal and symmetric.  ?Psychiatric:     ?   Attention and Perception: Attention normal.     ?   Mood and Affect: Mood normal.     ?   Speech: Speech normal.     ?   Behavior: Behavior normal. Behavior is co

## 2021-07-26 NOTE — Assessment & Plan Note (Signed)
Chronic, ongoing.  A1c downward trend today at 8.8%, previous 10.2%.  Urine ALB 28 April 2021. Has poor diet at home. Missed endo visit in September for initial check, new referral placed.  Continue Jardiance and increase Tresiba to 45 units BID + meal time Humalog to 8 units before meals. ?- Poor tolerance to Metformin in past and allergic to Sulfa.  Would avoid GLP at this time due to patient history of pancreatitis, concern this would flare.  Educated patient at length on effect of diabetes from head to toe and increased risk for recurrent CVA due to poor control.   ?- Recommend they check his BS TID -- he would benefit from [facility] will work on this with CCM team in future.  Goal A1C <6.5% due to CVA history -- which reiterated at length with patient today.  Continue collaboration with CCM team.  Return in 3 months. ?

## 2021-07-26 NOTE — Assessment & Plan Note (Signed)
Chronic, ongoing.  Continue Lipitor and Fenofibrate. Lipid panel up to date.  Adjust doses as needed, if LDL >70. ?

## 2021-07-26 NOTE — Assessment & Plan Note (Signed)
Ongoing, is followed by cardiology and vascular at this time.  Continue collaboration with both and current statin and ASA. ?

## 2021-07-26 NOTE — Assessment & Plan Note (Signed)
Grade 2/6 on auscultation, continue collaboration with cardiology.   ?

## 2021-07-26 NOTE — Assessment & Plan Note (Signed)
Ongoing discomfort and weakness noted on grip strength.  Will get back into ortho for further recommendations and recommend wearing wrist support sleeve at home and applying Voltaren gel as needed. ?

## 2021-07-26 NOTE — Assessment & Plan Note (Signed)
Mental delays present, continue to work with CCM team in [place] with education and reiterating diet needs. ?

## 2021-07-26 NOTE — Assessment & Plan Note (Signed)
Continue collaboration with neurology and continue statin + ASA/Plavix.  Discussed goals BP <130/90, A1C < 6.5%, and LDL <70.  He is poorly controlled with diabetes, much related to knowledge base and poor diet.  Referral to endocrinology placed and have HIGHLY recommended he attend. ?

## 2021-07-26 NOTE — Assessment & Plan Note (Signed)
Chronic, stable with BP at goal in office.  Continue current medication regimen and collaboration with cardiology, adjust regimen as needed.  Labs: BMP and A1c.  Recommend they check his BP at home at least daily and document + focus on DASH diet.  Return to office in 3 months for BP follow-up. ?

## 2021-07-26 NOTE — Assessment & Plan Note (Signed)
Chronic, ongoing.  A1c downward trend today at 8.8%, previous 10.2%.  Urine ALB 28 April 2021. Has poor diet at home. Missed endo visit in September for initial check, new referral placed.  Continue Jardiance and increase Tresiba to 45 units BID + meal time Humalog to 8 units before meals. ?- Poor tolerance to Metformin in past and allergic to Sulfa.  Would avoid GLP at this time due to patient history of pancreatitis, concern this would flare.  Educated patient at length on effect of diabetes from head to toe and increased risk for recurrent CVA due to poor control.   ?- Recommend they check his BS TID -- he would benefit from [facility] will work on this with CCM team in future.  Goal A1C <6.5% due to CVA history -- which reiterated at length with patient today.  Continue collaboration with CCM team.  Return in 3 months. ?

## 2021-07-26 NOTE — Assessment & Plan Note (Signed)
Chronic, ongoing.  Continue Omeprazole daily and adjust as needed.  Mag level up to date.  Discussed with him that imaging showed no hernia, which he was upset about. ?

## 2021-07-27 LAB — BASIC METABOLIC PANEL
BUN/Creatinine Ratio: 21 — ABNORMAL HIGH (ref 9–20)
BUN: 21 mg/dL (ref 6–24)
CO2: 18 mmol/L — ABNORMAL LOW (ref 20–29)
Calcium: 9 mg/dL (ref 8.7–10.2)
Chloride: 103 mmol/L (ref 96–106)
Creatinine, Ser: 1.02 mg/dL (ref 0.76–1.27)
Glucose: 322 mg/dL — ABNORMAL HIGH (ref 70–99)
Potassium: 4.2 mmol/L (ref 3.5–5.2)
Sodium: 138 mmol/L (ref 134–144)
eGFR: 85 mL/min/{1.73_m2} (ref >59)

## 2021-07-27 NOTE — Progress Notes (Signed)
Good morning, please let [name] know his labs have returned.  Kidney function remains stable this check, improved from previous.  Continue the water and not the soda.  Sugar was elevated at 322, but your A1c is coming down, so follow instructions from yesterday.  Any questions? ?Keep being amazing!!  Thank you for allowing me to participate in your care.  I appreciate you. ?Kindest regards, ?Lineth Thielke ?

## 2021-07-31 ENCOUNTER — Telehealth: Payer: Self-pay | Admitting: Nurse Practitioner

## 2021-07-31 ENCOUNTER — Ambulatory Visit
Admission: RE | Admit: 2021-07-31 | Discharge: 2021-07-31 | Disposition: A | Discharge status: Home / Self Care | Payer: Medicare Other | Attending: Nurse Practitioner | Admitting: Nurse Practitioner

## 2021-07-31 ENCOUNTER — Ambulatory Visit
Admission: RE | Admit: 2021-07-31 | Discharge: 2021-07-31 | Disposition: A | Discharge status: Home / Self Care | Payer: Medicare Other | Source: Ambulatory Visit | Attending: Nurse Practitioner | Admitting: Nurse Practitioner

## 2021-07-31 ENCOUNTER — Ambulatory Visit: Payer: Self-pay | Admitting: *Deleted

## 2021-07-31 DIAGNOSIS — M25551 Pain in right hip: Secondary | ICD-10-CM | POA: Diagnosis not present

## 2021-07-31 NOTE — Telephone Encounter (Signed)
Patient has an scheduled appointment with Jolene tomorrow at 1:20 pm and will discuss at appointment tomorrow with provider Jolene.  ?

## 2021-07-31 NOTE — Telephone Encounter (Signed)
?  Chief Complaint: Requesting an x ray of his stomach because his father believes pt. may have cancer because his cousin has cancer.  He is having diarrhea and stomach and side pain. ?Symptoms: diarrhea and stomach and side pain for 3 wks.   Seen Jolene recently for side pain ?Frequency: for 3 weeks ?Pertinent Negatives: Patient denies blood in stools ?Disposition: _0 ED /_1 Urgent Care (no appt availability in office) / _2 Appointment(In office/virtual)/ _3  [facility]/ _4 Home Care/ _5 Refused Recommended Disposition /_6 [facility]/ _7  Follow-up with PCP ?Additional Notes: Pt is afraid he has cancer so wanting to have stomach x ray done per his father's instruction.   Scheduled with Marnee Guarneri, NP for 08/01/2021.    ?

## 2021-07-31 NOTE — Telephone Encounter (Signed)
Reason for Disposition ? [1] MODERATE pain (e.g., interferes with normal activities) AND [2] pain comes and goes (cramps) AND [3] present > 24 hours  (Exception: pain with Vomiting or Diarrhea - see that Guideline) ? ?Answer Assessment - Initial Assessment Questions ?1. LOCATION: "Where does it hurt?"  ?    Pt having abd pain pretty bad for a while, 3 weeks.  Right side. ?2. RADIATION: "Does the pain shoot anywhere else?" (e.g., chest, back) ?    My dad is worried because my cousin had cancer and so my Dad thinks I might have cancer.   My Dad wants me to have an x ray of my stomach.   Jolene sent a referral for my side.   I have knots on my stomach so I'm afraid I have cancer.   I haven't had the x ray done.   My Dad wants me to have an x ray of my stomach.    ?3. ONSET: "When did the pain begin?" (Minutes, hours or days ago)  ?    3 weeks ago.   I recently saw Marnee Guarneri, NP for my sugar.   She thought I had a hernia but I don't.     ?I want an x ray for my stomach too.   ?4. SUDDEN: "Gradual or sudden onset?" ?    *No Answer* ?5. PATTERN "Does the pain come and go, or is it constant?" ?   - If constant: "Is it getting better, staying the same, or worsening?"  ?    (Note: Constant means the pain never goes away completely; most serious pain is constant and it progresses)  ?   - If intermittent: "How long does it last?" "Do you have pain now?" ?    (Note: Intermittent means the pain goes away completely between bouts) ?    *No Answer* ?6. SEVERITY: "How bad is the pain?"  (e.g., Scale 1-10; mild, moderate, or severe) ?   - MILD (1-3): doesn't interfere with normal activities, abdomen soft and not tender to touch  ?   - MODERATE (4-7): interferes with normal activities or awakens from sleep, abdomen tender to touch  ?   - SEVERE (8-10): excruciating pain, doubled over, unable to do any normal activities   ?    *No Answer* ?7. RECURRENT SYMPTOM: "Have you ever had this type of stomach pain before?" If Yes, ask:  "When was the last time?" and "What happened that time?"  ?    *No Answer* ?8. CAUSE: "What do you think is causing the stomach pain?" ?    *No Answer* ?9. RELIEVING/AGGRAVATING FACTORS: "What makes it better or worse?" (e.g., movement, antacids, bowel movement) ?    *No Answer* ?10. OTHER SYMPTOMS: "Do you have any other symptoms?" (e.g., back pain, diarrhea, fever, urination pain, vomiting) ?      *No Answer* ? ?Protocols used: Abdominal Pain - Male-A-AH ? ?

## 2021-07-31 NOTE — Telephone Encounter (Signed)
Copied from [place] [phone]. Topic: General - Other ?>> Jul 31, 2021  9:22 AM McGill, Nelva Bush wrote: ?Reason for CRM: Pt is requesting x-ray on stomach. Pt mentioned a cousin in [place] who has cancer and has knots in his stomach like him; this is why he is requesting an x-ray. Pt stated he needs this sent as soon as possible so he can get both done at the same time. ?

## 2021-07-31 NOTE — Telephone Encounter (Signed)
Pt is calling to see if he can receive a paper copy of his xrays. [phone] ?

## 2021-08-01 ENCOUNTER — Encounter: Payer: Self-pay | Admitting: Nurse Practitioner

## 2021-08-01 ENCOUNTER — Ambulatory Visit (INDEPENDENT_AMBULATORY_CARE_PROVIDER_SITE_OTHER): Payer: Medicare Other | Admitting: Nurse Practitioner

## 2021-08-01 VITALS — BP 136/78 | HR 51 | Temp 97.5°F | Ht 64.0 in | Wt 176.0 lb

## 2021-08-01 DIAGNOSIS — K760 Fatty (change of) liver, not elsewhere classified: Secondary | ICD-10-CM | POA: Diagnosis not present

## 2021-08-01 DIAGNOSIS — M25551 Pain in right hip: Secondary | ICD-10-CM

## 2021-08-01 NOTE — Assessment & Plan Note (Signed)
Acute, for weeks.  Some discomfort with ROM today.  Recent imaging not resulted yet.  Placed ortho referral per patient last visit.  Recommend simple treatment, such as Tylenol and Icy/Hot, at home.  If worsening return to office. ?

## 2021-08-01 NOTE — Progress Notes (Signed)
Please let [name] know his hip imaging returned and does show some arthritic changes, this could be causing discomfort.  I definitely recommend discussing this with orthopedics when you visit.  Any questions? ?Keep being amazing!!  Thank you for allowing me to participate in your care.  I appreciate you. ?Kindest regards, ?Nyan Dufresne

## 2021-08-01 NOTE — Patient Instructions (Signed)
[facility] == [phone] ? ?Hip Pain ?The hip is the joint between the upper legs and the lower pelvis. The bones, cartilage, tendons, and muscles of your hip joint support your body and allow you to move around. ?Hip pain can range from a minor ache to severe pain in one or both of your hips. The pain may be felt on the inside of the hip joint near the groin, or on the outside near the buttocks and upper thigh. You may also have swelling or stiffness in your hip area. ?Follow these instructions at home: ?Managing pain, stiffness, and swelling ? ?  ? ?If directed, put ice on the painful area. To do this: ?Put ice in a plastic bag. ?Place a towel between your skin and the bag. ?Leave the ice on for 20 minutes, 2-3 times a day. ?If directed, apply heat to the affected area as often as told by your health care provider. Use the heat source that your health care provider recommends, such as a moist heat pack or a heating pad. ?Place a towel between your skin and the heat source. ?Leave the heat on for 20-30 minutes. ?Remove the heat if your skin turns bright red. This is especially important if you are unable to feel pain, heat, or cold. You may have a greater risk of getting burned. ?Activity ?Do exercises as told by your health care provider. ?Avoid activities that cause pain. ?General instructions ? ?Take over-the-counter and prescription medicines only as told by your health care provider. ?Keep a journal of your symptoms. Write down: ?How often you have hip pain. ?The location of your pain. ?What the pain feels like. ?What makes the pain worse. ?Sleep with a pillow between your legs on your most comfortable side. ?Keep all follow-up visits as told by your health care provider. This is important. ?Contact a health care provider if: ?You cannot put weight on your leg. ?Your pain or swelling continues or gets worse after one week. ?It gets harder to walk. ?You have a fever. ?Get help right away if: ?You  fall. ?You have a sudden increase in pain and swelling in your hip. ?Your hip is red or swollen or very tender to touch. ?Summary ?Hip pain can range from a minor ache to severe pain in one or both of your hips. ?The pain may be felt on the inside of the hip joint near the groin, or on the outside near the buttocks and upper thigh. ?Avoid activities that cause pain. ?Write down how often you have hip pain, the location of the pain, what makes it worse, and what it feels like. ?This information is not intended to replace advice given to you by your health care provider. Make sure you discuss any questions you have with your health care provider. ?Document Revised: 07/21/2018 Document Reviewed: 07/21/2018 ?Elsevier Patient Education ? [place]. ? ?

## 2021-08-01 NOTE — Progress Notes (Signed)
? ?BP 136/78 (BP Location: Left Arm, Patient Position: Sitting, Cuff Size: Normal)   Pulse (!) 51   Temp (!) 97.5 ?F (36.4 ?C) (Oral)   Ht 5' 4" (1.626 m)   Wt 176 lb (79.8 kg)   SpO2 94%   BMI 30.21 kg/m?   ? ?Subjective:  ? ? Patient ID: [name]., male    DOB: [birth year], 62 y.o.   MRN: [id number] ? ?HPI: ?[name]. is a 61 y.o. male ? ?Chief Complaint  ?Patient presents with  ? Diarrhea  ?  Patient states he is still going to the bathroom and he says he has been having diarrhea since yesterday.   ? Hip Pain  ? Cyst  ?  Patient says he has knots on his abdominal area. Patient is requesting CT scan of the abdomen and he says he has "big knots." Patient states the provider thought it was hernias and he would like to know what is going on.   ? ?ABDOMINAL PAIN  ?Reports he is having pain from the knots in his belly.  Recent CT scan of abdomen which showed no acute findings -- showed stable 4 cm left renal cyst (which has been present on past scans) and small gallstone.  He reports history of stomach cancer in family, cousin in [place] -- had same kind of "knots" and he is worried he has cancer.  Colonoscopy last 08/10/20, two polyps removed -- has to return in 7 years.  Continues on Omeprazole 20 MG once a day. ?Duration:months ?Onset: gradual ?Severity: 8/10 ?Quality: dull, aching, and throbbing, vibrating ?Location:  diffuse  ?Episode duration:  ?Radiation: no ?Frequency: constant ?Alleviating factors: nothing ?Aggravating factors: unknown ?Status: fluctuating ?Treatments attempted: PPI ?Fever: no ?Nausea: no ?Vomiting: no ?Weight loss: no ?Decreased appetite: no ?Diarrhea: yes a little on occasion ?Constipation: no ?Blood in stool: no ?Heartburn: no ?Jaundice: no ?Rash: no ?Dysuria/urinary frequency: no ?Hematuria: no ?History of sexually transmitted disease: no ?Recurrent NSAID use: no  ? ?HIP PAIN ?Ongoing right hip pain for weeks and is affecting his bending. ?Duration:  weeks ?Involved hip: right  ?Mechanism of injury: unknown ?Location: lateral ?Onset: gradual  ?Severity: 8/10  ?Quality: dull, aching, and throbbing ?Frequency: intermittent ?Radiation: no ?Aggravating factors: weight bearing, bending, and movement   ?Alleviating factors: nothing  ?Status: stable ?Treatments attempted: none   ?Relief with NSAIDs?: No NSAIDs Taken ?Weakness with weight bearing: no ?Weakness with walking: yes ?Paresthesias / decreased sensation: no ?Swelling: no ?Redness:no ?Fevers: no  ? ?Relevant past medical, surgical, family and social history reviewed and updated as indicated. Interim medical history since our last visit reviewed. ?Allergies and medications reviewed and updated. ? ?Review of Systems  ?Constitutional:  Negative for activity change, diaphoresis, fatigue and fever.  ?Respiratory:  Negative for cough, chest tightness, shortness of breath and wheezing.   ?Cardiovascular:  Negative for chest pain, palpitations and leg swelling.  ?Gastrointestinal:  Positive for abdominal pain and diarrhea. Negative for abdominal distention, blood in stool, constipation, nausea and vomiting.  ?Endocrine: Negative for cold intolerance, heat intolerance, polydipsia, polyphagia and polyuria.  ?Musculoskeletal:  Positive for arthralgias.  ?Neurological: Negative.   ?Psychiatric/Behavioral: Negative.    ? ?Per HPI unless specifically indicated above ? ?   ?Objective:  ?  ?BP 136/78 (BP Location: Left Arm, Patient Position: Sitting, Cuff Size: Normal)   Pulse (!) 51   Temp (!) 97.5 ?F (36.4 ?C) (Oral)   Ht 5' 4" (1.626 m)   Wt 176  lb (79.8 kg)   SpO2 94%   BMI 30.21 kg/m?   ?Wt Readings from Last 3 Encounters:  ?08/01/21 176 lb (79.8 kg)  ?07/26/21 178 lb 3.2 oz (80.8 kg)  ?07/10/21 177 lb (80.3 kg)  ?  ?Physical Exam ?Vitals and nursing note reviewed.  ?Constitutional:   ?   General: He is awake. He is not in acute distress. ?   Appearance: He is well-developed. He is obese. He is not ill-appearing.   ?HENT:  ?   Head: Normocephalic and atraumatic.  ?   Right Ear: Hearing normal. No drainage.  ?   Left Ear: Hearing normal. No drainage.  ?Eyes:  ?   General: Lids are normal.     ?   Right eye: No discharge.     ?   Left eye: No discharge.  ?   Conjunctiva/sclera: Conjunctivae normal.  ?   Pupils: Pupils are equal, round, and reactive to light.  ?Neck:  ?   Thyroid: No thyromegaly.  ?   Vascular: Carotid bruit (R>L) present.  ?Cardiovascular:  ?   Rate and Rhythm: Normal rate and regular rhythm.  ?   Heart sounds: S1 normal and S2 normal. Murmur heard.  ?Systolic murmur is present with a grade of 2/6.  ?  No gallop.  ?   Comments: Systolic murmur noted best at left sternal border, soft. ?Pulmonary:  ?   Effort: Pulmonary effort is normal. No accessory muscle usage or respiratory distress.  ?   Breath sounds: Normal breath sounds.  ?Abdominal:  ?   General: Bowel sounds are normal.  ?   Palpations: Abdomen is soft.  ?   Comments: Abdomen soft, although with exam patient continuously pushing out his muscles and stating "doesn't it feel hard".  ?Musculoskeletal:  ?   Right wrist: Normal.  ?   Left wrist: Normal.  ?   Cervical back: Normal range of motion and neck supple.  ?   Right lower leg: No edema.  ?   Left lower leg: No edema.  ?Skin: ?   General: Skin is warm and dry.  ?   Capillary Refill: Capillary refill takes less than 2 seconds.  ?Neurological:  ?   Mental Status: He is alert and oriented to person, place, and time.  ?   Deep Tendon Reflexes: Reflexes are normal and symmetric.  ?Psychiatric:     ?   Attention and Perception: Attention normal.     ?   Mood and Affect: Mood normal.     ?   Speech: Speech normal.     ?   Behavior: Behavior normal. Behavior is cooperative.  ? ? ?Results for orders placed or performed in visit on 07/26/21  ?Bayer DCA Hb A1c Waived  ?Result Value Ref Range  ? HB A1C (BAYER DCA - WAIVED) 8.8 (H) 4.8 - 5.6 %  ?Basic metabolic panel  ?Result Value Ref Range  ? Glucose 322 (H) 70 -  99 mg/dL  ? BUN 21 6 - 24 mg/dL  ? Creatinine, Ser 1.02 0.76 - 1.27 mg/dL  ? eGFR 85 >59 mL/min/1.73  ? BUN/Creatinine Ratio 21 (H) 9 - 20  ? Sodium 138 134 - 144 mmol/L  ? Potassium 4.2 3.5 - 5.2 mmol/L  ? Chloride 103 96 - 106 mmol/L  ? CO2 18 (L) 20 - 29 mmol/L  ? Calcium 9.0 8.7 - 10.2 mg/dL  ? ?   ?Assessment & Plan:  ? ?Problem List Items Addressed This  Visit   ? ?  ? Digestive  ? NAFLD (nonalcoholic fatty liver disease) - Primary  ?  Ongoing, recent CT scan reassuring.  Will get him into GI for further assessment due to ongoing abdominal pain.  Overall recent labs and imaging reassuring, discussed with him at length.   ? ?  ?  ? Relevant Orders  ? Ambulatory referral to Gastroenterology  ?  ? Other  ? Right hip pain  ?  Acute, for weeks.  Some discomfort with ROM today.  Recent imaging not resulted yet.  Placed ortho referral per patient last visit.  Recommend simple treatment, such as Tylenol and Icy/Hot, at home.  If worsening return to office. ? ?  ?  ?  ? ?Follow up plan: ?Return if symptoms worsen or fail to improve. ? ? ? ? ? ?

## 2021-08-01 NOTE — Assessment & Plan Note (Signed)
Ongoing, recent CT scan reassuring.  Will get him into GI for further assessment due to ongoing abdominal pain.  Overall recent labs and imaging reassuring, discussed with him at length.   ?

## 2021-08-02 DIAGNOSIS — Z961 Presence of intraocular lens: Secondary | ICD-10-CM | POA: Diagnosis not present

## 2021-08-09 ENCOUNTER — Other Ambulatory Visit: Payer: Self-pay | Admitting: Nurse Practitioner

## 2021-08-10 NOTE — Telephone Encounter (Signed)
Requested Prescriptions  Pending Prescriptions Disp Refills  . fenofibrate 54 MG tablet [Pharmacy Med Name: FENOFIBRATE 54 MG TAB] 90 tablet 0    Sig: TAKE 1 TABLET BY MOUTH ONCE DAILY     Cardiovascular:  Antilipid - Fibric Acid Derivatives Failed - 08/09/2021 10:05 AM      Failed - Lipid Panel in normal range within the last 12 months    Cholesterol, Total  Date Value Ref Range Status  04/26/2021 170 100 - 199 mg/dL Final   Cholesterol Piccolo, Waived  Date Value Ref Range Status  12/02/2018 136 <200 mg/dL Final    Comment:                            Desirable                <200                         Borderline High      200- 239                         High                     >239    LDL Chol Calc (NIH)  Date Value Ref Range Status  04/26/2021 92 0 - 99 mg/dL Final   HDL  Date Value Ref Range Status  04/26/2021 37 (L) >39 mg/dL Final   Triglycerides  Date Value Ref Range Status  04/26/2021 240 (H) 0 - 149 mg/dL Final   Triglycerides Piccolo,Waived  Date Value Ref Range Status  12/02/2018 145 <150 mg/dL Final    Comment:                            Normal                   <150                         Borderline High     150 - 199                         High                200 - 499                         Very High                >499          Passed - ALT in normal range and within 360 days    ALT  Date Value Ref Range Status  04/26/2021 36 0 - 44 IU/L Final         Passed - AST in normal range and within 360 days    AST  Date Value Ref Range Status  04/26/2021 24 0 - 40 IU/L Final         Passed - Cr in normal range and within 360 days    Creatinine, Ser  Date Value Ref Range Status  07/26/2021 1.02 0.76 - 1.27 mg/dL Final   Creatinine, POC  Date Value Ref Range Status  08/22/2015 n/a mg/dL Final  Passed - HGB in normal range and within 360 days    Hemoglobin  Date Value Ref Range Status  04/26/2021 14.6 13.0 - 17.7 g/dL Final          Passed - HCT in normal range and within 360 days    Hematocrit  Date Value Ref Range Status  04/26/2021 43.9 37.5 - 51.0 % Final         Passed - PLT in normal range and within 360 days    Platelets  Date Value Ref Range Status  04/26/2021 174 150 - 450 x10E3/uL Final         Passed - WBC in normal range and within 360 days    WBC  Date Value Ref Range Status  04/26/2021 4.8 3.4 - 10.8 x10E3/uL Final  03/09/2019 6.9 4.0 - 10.5 K/uL Final         Passed - eGFR is 30 or above and within 360 days    GFR calc Af Amer  Date Value Ref Range Status  02/24/2020 76 >59 mL/min/1.73 Final    Comment:    **In accordance with recommendations from the NKF-ASN Task force,**   [facility] is in the process of updating its eGFR calculation to the   2021 CKD-EPI creatinine equation that estimates kidney function   without a race variable.    GFR calc non Af Amer  Date Value Ref Range Status  02/24/2020 66 >59 mL/min/1.73 Final   eGFR  Date Value Ref Range Status  07/26/2021 85 >59 mL/min/1.73 Final         Passed - Valid encounter within last 12 months    Recent Outpatient Visits          1 week ago NAFLD (nonalcoholic fatty liver disease)   [place], Jolene T, NP   2 weeks ago Uncontrolled type 2 diabetes mellitus with hyperglycemia ([place])   [place], Jolene T, NP   2 months ago Type 2 diabetes mellitus with proteinuria ([place])   [place], Jolene T, NP   3 months ago Uncontrolled type 2 diabetes mellitus with hyperglycemia ([place])   [place], Jolene T, NP   6 months ago Uncontrolled type 2 diabetes mellitus with hyperglycemia ([place])   [place], Barbaraann Faster, NP      Future Appointments            In 2 months [place], Barbaraann Faster, NP [employer]

## 2021-08-18 ENCOUNTER — Telehealth: Payer: Medicare Other

## 2021-08-18 ENCOUNTER — Telehealth: Payer: Self-pay

## 2021-08-18 NOTE — Telephone Encounter (Signed)
  Care Management   Follow Up Note   08/18/2021 Name: [name]. MRN: [id number] DOB: Jan 29, 1961   Referred by: [name], NP Reason for referral : Chronic Care Management (RNCM: Follow up for Chronic Disease Management and Care Coordination Needs)   An unsuccessful telephone outreach was attempted today. The patient was referred to the case management team for assistance with care management and care coordination.   Follow Up Plan: A HIPPA compliant phone message was left for the patient providing contact information and requesting a return call.   Noreene Larsson RN, MSN, [facility] Mobile: [phone]

## 2021-08-23 ENCOUNTER — Encounter (INDEPENDENT_AMBULATORY_CARE_PROVIDER_SITE_OTHER): Payer: Medicare Other

## 2021-08-23 ENCOUNTER — Ambulatory Visit (INDEPENDENT_AMBULATORY_CARE_PROVIDER_SITE_OTHER): Payer: Medicare Other | Admitting: Nurse Practitioner

## 2021-08-24 ENCOUNTER — Other Ambulatory Visit: Payer: Self-pay | Admitting: Nurse Practitioner

## 2021-08-24 NOTE — Telephone Encounter (Signed)
Requested Prescriptions  Pending Prescriptions Disp Refills  . triamcinolone cream (KENALOG) 0.1 % [Pharmacy Med Name: TRIAMCINOLONE ACETON 0.1% TOP CREAM] 30 g 1    Sig: APPLY TO AFFECTED AREA(s) TWICE DAILY ASNEEDED     Not Delegated - Dermatology:  Corticosteroids Failed - 08/24/2021 10:05 AM      Failed - This refill cannot be delegated      Passed - Valid encounter within last 12 months    Recent Outpatient Visits          3 weeks ago NAFLD (nonalcoholic fatty liver disease)   [place] Cannady, Jolene T, NP   4 weeks ago Uncontrolled type 2 diabetes mellitus with hyperglycemia ([place])   [place], Jolene T, NP   3 months ago Type 2 diabetes mellitus with proteinuria ([place])   [place], Jolene T, NP   4 months ago Uncontrolled type 2 diabetes mellitus with hyperglycemia ([place])   [place], Jolene T, NP   7 months ago Uncontrolled type 2 diabetes mellitus with hyperglycemia ([place])   [place], Barbaraann Faster, NP      Future Appointments            In 2 months Cannady, Barbaraann Faster, NP [facility]           . JARDIANCE 25 MG TABS tablet [Pharmacy Med Name: JARDIANCE 25 MG TAB] 90 tablet 1    Sig: TAKE 1 TABLET BY MOUTH ONCE DAILY     Endocrinology:  Diabetes - SGLT2 Inhibitors Failed - 08/24/2021 10:05 AM      Failed - HBA1C is between 0 and 7.9 and within 180 days    HB A1C (BAYER DCA - WAIVED)  Date Value Ref Range Status  07/26/2021 8.8 (H) 4.8 - 5.6 % Final    Comment:             Prediabetes: 5.7 - 6.4          Diabetes: >6.4          Glycemic control for adults with diabetes: <7.0          Passed - Cr in normal range and within 360 days    Creatinine, Ser  Date Value Ref Range Status  07/26/2021 1.02 0.76 - 1.27 mg/dL Final   Creatinine, POC  Date Value Ref Range Status  08/22/2015 n/a mg/dL Final         Passed - eGFR in normal range and within  360 days    GFR calc Af Amer  Date Value Ref Range Status  02/24/2020 76 >59 mL/min/1.73 Final    Comment:    **In accordance with recommendations from the NKF-ASN Task force,**   [facility] is in the process of updating its eGFR calculation to the   2021 CKD-EPI creatinine equation that estimates kidney function   without a race variable.    GFR calc non Af Amer  Date Value Ref Range Status  02/24/2020 66 >59 mL/min/1.73 Final   eGFR  Date Value Ref Range Status  07/26/2021 85 >59 mL/min/1.73 Final         Passed - Valid encounter within last 6 months    Recent Outpatient Visits          3 weeks ago NAFLD (nonalcoholic fatty liver disease)   [place], Jolene T, NP   4 weeks ago Uncontrolled type 2 diabetes mellitus with hyperglycemia ([place])   [facility] [place], Jolene T, NP   3 months ago Type 2 diabetes mellitus with proteinuria ([place])   [place], Jolene T, NP   4 months ago Uncontrolled type 2 diabetes mellitus with hyperglycemia ([place])   [place], Jolene T, NP   7 months ago Uncontrolled type 2 diabetes mellitus with hyperglycemia ([place])   [place], Barbaraann Faster, NP      Future Appointments            In 2 months Cannady, Barbaraann Faster, NP [facility]           . lisinopril (ZESTRIL) 20 MG tablet [Pharmacy Med Name: LISINOPRIL 20 MG TAB] 90 tablet 1    Sig: TAKE 1 TABLET BY MOUTH ONCE DAILY     Cardiovascular:  ACE Inhibitors Passed - 08/24/2021 10:05 AM      Passed - Cr in normal range and within 180 days    Creatinine, Ser  Date Value Ref Range Status  07/26/2021 1.02 0.76 - 1.27 mg/dL Final   Creatinine, POC  Date Value Ref Range Status  08/22/2015 n/a mg/dL Final         Passed - K in normal range and within 180 days    Potassium  Date Value Ref Range Status  07/26/2021 4.2 3.5 - 5.2 mmol/L Final         Passed - Patient is not pregnant       Passed - Last BP in normal range    BP Readings from Last 1 Encounters:  08/01/21 136/78         Passed - Valid encounter within last 6 months    Recent Outpatient Visits          3 weeks ago NAFLD (nonalcoholic fatty liver disease)   [place] Cannady, Jolene T, NP   4 weeks ago Uncontrolled type 2 diabetes mellitus with hyperglycemia ([place])   [place], Jolene T, NP   3 months ago Type 2 diabetes mellitus with proteinuria ([place])   [place], Jolene T, NP   4 months ago Uncontrolled type 2 diabetes mellitus with hyperglycemia ([place])   [place], Jolene T, NP   7 months ago Uncontrolled type 2 diabetes mellitus with hyperglycemia ([place])   [place], Barbaraann Faster, NP      Future Appointments            In 2 months Cannady, Barbaraann Faster, NP [facility]           . omeprazole (PRILOSEC) 20 MG capsule [Pharmacy Med Name: OMEPRAZOLE DR 20 MG CAP] 90 capsule 1    Sig: TAKE 1 CAPSULE BY MOUTH ONCE DAILY     Gastroenterology: Proton Pump Inhibitors Passed - 08/24/2021 10:05 AM      Passed - Valid encounter within last 12 months    Recent Outpatient Visits          3 weeks ago NAFLD (nonalcoholic fatty liver disease)   [place] Cannady, Jolene T, NP   4 weeks ago Uncontrolled type 2 diabetes mellitus with hyperglycemia ([place])   [place], Jolene T, NP   3 months ago Type 2 diabetes mellitus with proteinuria ([place])   [place], Jolene T, NP   4 months ago Uncontrolled type 2 diabetes mellitus with hyperglycemia ([place])   [place], Barbaraann Faster, NP  7 months ago Uncontrolled type 2 diabetes mellitus with hyperglycemia ([place])   [place], [place] T, NP      Future Appointments            In 2 months Cannady, Barbaraann Faster, NP [facility]            . gabapentin (NEURONTIN) 300 MG capsule [Pharmacy Med Name: GABAPENTIN 300 MG CAP] 180 capsule 1    Sig: TAKE [place]     Neurology: Anticonvulsants - gabapentin Passed - 08/24/2021 10:05 AM      Passed - Cr in normal range and within 360 days    Creatinine, Ser  Date Value Ref Range Status  07/26/2021 1.02 0.76 - 1.27 mg/dL Final   Creatinine, POC  Date Value Ref Range Status  08/22/2015 n/a mg/dL Final         Passed - Completed PHQ-2 or PHQ-9 in the last 360 days      Passed - Valid encounter within last 12 months    Recent Outpatient Visits          3 weeks ago NAFLD (nonalcoholic fatty liver disease)   [place] Cannady, Jolene T, NP   4 weeks ago Uncontrolled type 2 diabetes mellitus with hyperglycemia ([place])   [place], Jolene T, NP   3 months ago Type 2 diabetes mellitus with proteinuria ([place])   [place], Jolene T, NP   4 months ago Uncontrolled type 2 diabetes mellitus with hyperglycemia ([place])   [place], Jolene T, NP   7 months ago Uncontrolled type 2 diabetes mellitus with hyperglycemia ([place])   [place], Barbaraann Faster, NP      Future Appointments            In 2 months Cannady, Barbaraann Faster, NP [facility]           . fenofibrate 54 MG tablet [Pharmacy Med Name: FENOFIBRATE 54 MG TAB] 90 tablet 0    Sig: TAKE 1 TABLET BY MOUTH ONCE DAILY     Cardiovascular:  Antilipid - Fibric Acid Derivatives Failed - 08/24/2021 10:05 AM      Failed - Lipid Panel in normal range within the last 12 months    Cholesterol, Total  Date Value Ref Range Status  04/26/2021 170 100 - 199 mg/dL Final   Cholesterol Piccolo, Waived  Date Value Ref Range Status  12/02/2018 136 <200 mg/dL Final    Comment:                            Desirable                <200                         Borderline High      200- 239                          High                     >239    LDL Chol Calc (NIH)  Date Value Ref Range Status  04/26/2021 92 0 - 99 mg/dL Final   HDL  Date Value Ref Range Status  04/26/2021 37 (L) >39 mg/dL Final  Triglycerides  Date Value Ref Range Status  04/26/2021 240 (H) 0 - 149 mg/dL Final   Triglycerides Piccolo,Waived  Date Value Ref Range Status  12/02/2018 145 <150 mg/dL Final    Comment:                            Normal                   <150                         Borderline High     150 - 199                         High                200 - 499                         Very High                >499          Passed - ALT in normal range and within 360 days    ALT  Date Value Ref Range Status  04/26/2021 36 0 - 44 IU/L Final         Passed - AST in normal range and within 360 days    AST  Date Value Ref Range Status  04/26/2021 24 0 - 40 IU/L Final         Passed - Cr in normal range and within 360 days    Creatinine, Ser  Date Value Ref Range Status  07/26/2021 1.02 0.76 - 1.27 mg/dL Final   Creatinine, POC  Date Value Ref Range Status  08/22/2015 n/a mg/dL Final         Passed - HGB in normal range and within 360 days    Hemoglobin  Date Value Ref Range Status  04/26/2021 14.6 13.0 - 17.7 g/dL Final         Passed - HCT in normal range and within 360 days    Hematocrit  Date Value Ref Range Status  04/26/2021 43.9 37.5 - 51.0 % Final         Passed - PLT in normal range and within 360 days    Platelets  Date Value Ref Range Status  04/26/2021 174 150 - 450 x10E3/uL Final         Passed - WBC in normal range and within 360 days    WBC  Date Value Ref Range Status  04/26/2021 4.8 3.4 - 10.8 x10E3/uL Final  03/09/2019 6.9 4.0 - 10.5 K/uL Final         Passed - eGFR is 30 or above and within 360 days    GFR calc Af Amer  Date Value Ref Range Status  02/24/2020 76 >59 mL/min/1.73 Final    Comment:    **In accordance with recommendations from the NKF-ASN  Task force,**   [facility] is in the process of updating its eGFR calculation to the   2021 CKD-EPI creatinine equation that estimates kidney function   without a race variable.    GFR calc non Af Amer  Date Value Ref Range Status  02/24/2020 66 >59 mL/min/1.73 Final   eGFR  Date Value Ref Range Status  07/26/2021 85 >59 mL/min/1.73 Final  Passed - Valid encounter within last 12 months    Recent Outpatient Visits          3 weeks ago NAFLD (nonalcoholic fatty liver disease)   [place] Cannady, Jolene T, NP   4 weeks ago Uncontrolled type 2 diabetes mellitus with hyperglycemia ([place])   [place], Jolene T, NP   3 months ago Type 2 diabetes mellitus with proteinuria ([place])   [place], Jolene T, NP   4 months ago Uncontrolled type 2 diabetes mellitus with hyperglycemia ([place])   [place], Jolene T, NP   7 months ago Uncontrolled type 2 diabetes mellitus with hyperglycemia ([place])   [place], Barbaraann Faster, NP      Future Appointments            In 2 months Cannady, Barbaraann Faster, NP [facility]

## 2021-08-24 NOTE — Telephone Encounter (Signed)
Requested medication (s) are due for refill today: yes  Requested medication (s) are on the active medication list: yes  Last refill:  07/26/21 for Triamcinolone cream and 08/23/21 for fenofibrate  Future visit scheduled: yes  Notes to clinic:  Unable to refill per protocol, cannot delegate(Triamcinolone cream ). Unable to refill per protocol, last refill by provider 08/23/21.     Requested Prescriptions  Pending Prescriptions Disp Refills   triamcinolone cream (KENALOG) 0.1 % [Pharmacy Med Name: TRIAMCINOLONE ACETON 0.1% TOP CREAM] 30 g 1    Sig: APPLY TO AFFECTED AREA(s) TWICE DAILY ASNEEDED     Not Delegated - Dermatology:  Corticosteroids Failed - 08/24/2021 10:05 AM      Failed - This refill cannot be delegated      Passed - Valid encounter within last 12 months    Recent Outpatient Visits           3 weeks ago NAFLD (nonalcoholic fatty liver disease)   [place] Cannady, Jolene T, NP   4 weeks ago Uncontrolled type 2 diabetes mellitus with hyperglycemia ([place])   [place], Jolene T, NP   3 months ago Type 2 diabetes mellitus with proteinuria ([place])   [place], Jolene T, NP   4 months ago Uncontrolled type 2 diabetes mellitus with hyperglycemia ([place])   [place] Cannady, Jolene T, NP   7 months ago Uncontrolled type 2 diabetes mellitus with hyperglycemia ([place])   [place], [place] T, NP       Future Appointments             In 2 months Cannady, Barbaraann Faster, NP [facility]             fenofibrate 54 MG tablet [Pharmacy Med Name: FENOFIBRATE 54 MG TAB] 90 tablet 0    Sig: TAKE 1 TABLET BY MOUTH ONCE DAILY     Cardiovascular:  Antilipid - Fibric Acid Derivatives Failed - 08/24/2021 10:05 AM      Failed - Lipid Panel in normal range within the last 12 months    Cholesterol, Total  Date Value Ref Range Status  04/26/2021 170 100 - 199 mg/dL Final    Cholesterol Piccolo, Waived  Date Value Ref Range Status  12/02/2018 136 <200 mg/dL Final    Comment:                            Desirable                <200                         Borderline High      200- 239                         High                     >239    LDL Chol Calc (NIH)  Date Value Ref Range Status  04/26/2021 92 0 - 99 mg/dL Final   HDL  Date Value Ref Range Status  04/26/2021 37 (L) >39 mg/dL Final   Triglycerides  Date Value Ref Range Status  04/26/2021 240 (H) 0 - 149 mg/dL Final   Triglycerides Piccolo,Waived  Date Value Ref Range Status  12/02/2018 145 <150 mg/dL Final  Comment:                            Normal                   <150                         Borderline High     150 - 199                         High                200 - 499                         Very High                >499          Passed - ALT in normal range and within 360 days    ALT  Date Value Ref Range Status  04/26/2021 36 0 - 44 IU/L Final         Passed - AST in normal range and within 360 days    AST  Date Value Ref Range Status  04/26/2021 24 0 - 40 IU/L Final         Passed - Cr in normal range and within 360 days    Creatinine, Ser  Date Value Ref Range Status  07/26/2021 1.02 0.76 - 1.27 mg/dL Final   Creatinine, POC  Date Value Ref Range Status  08/22/2015 n/a mg/dL Final         Passed - HGB in normal range and within 360 days    Hemoglobin  Date Value Ref Range Status  04/26/2021 14.6 13.0 - 17.7 g/dL Final         Passed - HCT in normal range and within 360 days    Hematocrit  Date Value Ref Range Status  04/26/2021 43.9 37.5 - 51.0 % Final         Passed - PLT in normal range and within 360 days    Platelets  Date Value Ref Range Status  04/26/2021 174 150 - 450 x10E3/uL Final         Passed - WBC in normal range and within 360 days    WBC  Date Value Ref Range Status  04/26/2021 4.8 3.4 - 10.8 x10E3/uL Final  03/09/2019  6.9 4.0 - 10.5 K/uL Final         Passed - eGFR is 30 or above and within 360 days    GFR calc Af Amer  Date Value Ref Range Status  02/24/2020 76 >59 mL/min/1.73 Final    Comment:    **In accordance with recommendations from the NKF-ASN Task force,**   [facility] is in the process of updating its eGFR calculation to the   2021 CKD-EPI creatinine equation that estimates kidney function   without a race variable.    GFR calc non Af Amer  Date Value Ref Range Status  02/24/2020 66 >59 mL/min/1.73 Final   eGFR  Date Value Ref Range Status  07/26/2021 85 >59 mL/min/1.73 Final         Passed - Valid encounter within last 12 months    Recent Outpatient Visits           3 weeks ago NAFLD (nonalcoholic  fatty liver disease)   [place] Cannady, Jolene T, NP   4 weeks ago Uncontrolled type 2 diabetes mellitus with hyperglycemia ([place])   [place], Jolene T, NP   3 months ago Type 2 diabetes mellitus with proteinuria ([place])   [place] Cannady, Jolene T, NP   4 months ago Uncontrolled type 2 diabetes mellitus with hyperglycemia ([place])   [place] Cannady, Jolene T, NP   7 months ago Uncontrolled type 2 diabetes mellitus with hyperglycemia ([place])   [place], Henrine Screws T, NP       Future Appointments             In 2 months Cannady, Barbaraann Faster, NP [facility]            Signed Prescriptions Disp Refills   JARDIANCE 25 MG TABS tablet 90 tablet 1    Sig: TAKE 1 TABLET BY MOUTH ONCE DAILY     Endocrinology:  Diabetes - SGLT2 Inhibitors Failed - 08/24/2021 10:05 AM      Failed - HBA1C is between 0 and 7.9 and within 180 days    HB A1C (BAYER DCA - WAIVED)  Date Value Ref Range Status  07/26/2021 8.8 (H) 4.8 - 5.6 % Final    Comment:             Prediabetes: 5.7 - 6.4          Diabetes: >6.4          Glycemic control for adults with diabetes: <7.0          Passed - Cr in  normal range and within 360 days    Creatinine, Ser  Date Value Ref Range Status  07/26/2021 1.02 0.76 - 1.27 mg/dL Final   Creatinine, POC  Date Value Ref Range Status  08/22/2015 n/a mg/dL Final         Passed - eGFR in normal range and within 360 days    GFR calc Af Amer  Date Value Ref Range Status  02/24/2020 76 >59 mL/min/1.73 Final    Comment:    **In accordance with recommendations from the NKF-ASN Task force,**   [facility] is in the process of updating its eGFR calculation to the   2021 CKD-EPI creatinine equation that estimates kidney function   without a race variable.    GFR calc non Af Amer  Date Value Ref Range Status  02/24/2020 66 >59 mL/min/1.73 Final   eGFR  Date Value Ref Range Status  07/26/2021 85 >59 mL/min/1.73 Final         Passed - Valid encounter within last 6 months    Recent Outpatient Visits           3 weeks ago NAFLD (nonalcoholic fatty liver disease)   [place] Cannady, Jolene T, NP   4 weeks ago Uncontrolled type 2 diabetes mellitus with hyperglycemia ([place])   [place], Jolene T, NP   3 months ago Type 2 diabetes mellitus with proteinuria ([place])   [place], Jolene T, NP   4 months ago Uncontrolled type 2 diabetes mellitus with hyperglycemia ([place])   [place], Jolene T, NP   7 months ago Uncontrolled type 2 diabetes mellitus with hyperglycemia ([place])   [place], Henrine Screws T, NP       Future Appointments             In 2 months  [name], NP [facility]             lisinopril (ZESTRIL) 20 MG tablet 90 tablet 1    Sig: TAKE 1 TABLET BY MOUTH ONCE DAILY     Cardiovascular:  ACE Inhibitors Passed - 08/24/2021 10:05 AM      Passed - Cr in normal range and within 180 days    Creatinine, Ser  Date Value Ref Range Status  07/26/2021 1.02 0.76 - 1.27 mg/dL Final   Creatinine, POC  Date Value Ref Range  Status  08/22/2015 n/a mg/dL Final         Passed - K in normal range and within 180 days    Potassium  Date Value Ref Range Status  07/26/2021 4.2 3.5 - 5.2 mmol/L Final         Passed - Patient is not pregnant      Passed - Last BP in normal range    BP Readings from Last 1 Encounters:  08/01/21 136/78         Passed - Valid encounter within last 6 months    Recent Outpatient Visits           3 weeks ago NAFLD (nonalcoholic fatty liver disease)   [place] Cannady, Jolene T, NP   4 weeks ago Uncontrolled type 2 diabetes mellitus with hyperglycemia ([place])   [place], Jolene T, NP   3 months ago Type 2 diabetes mellitus with proteinuria ([place])   [place], Jolene T, NP   4 months ago Uncontrolled type 2 diabetes mellitus with hyperglycemia ([place])   [place], Jolene T, NP   7 months ago Uncontrolled type 2 diabetes mellitus with hyperglycemia ([place])   [place], Barbaraann Faster, NP       Future Appointments             In 2 months Cannady, Jolene T, NP [facility]             omeprazole (PRILOSEC) 20 MG capsule 90 capsule 1    Sig: TAKE 1 CAPSULE BY MOUTH ONCE DAILY     Gastroenterology: Proton Pump Inhibitors Passed - 08/24/2021 10:05 AM      Passed - Valid encounter within last 12 months    Recent Outpatient Visits           3 weeks ago NAFLD (nonalcoholic fatty liver disease)   [place] Cannady, Jolene T, NP   4 weeks ago Uncontrolled type 2 diabetes mellitus with hyperglycemia ([place])   [place], Jolene T, NP   3 months ago Type 2 diabetes mellitus with proteinuria ([place])   [place], Jolene T, NP   4 months ago Uncontrolled type 2 diabetes mellitus with hyperglycemia ([place])   [place] Cannady, Jolene T, NP   7 months ago Uncontrolled type 2 diabetes mellitus with  hyperglycemia ([place])   [place], Barbaraann Faster, NP       Future Appointments             In 2 months Cannady, Barbaraann Faster, NP [facility]             gabapentin (NEURONTIN) 300 MG capsule 180 capsule 1    Sig: TAKE 1 CAPSULE BY MOUTH TWICE DAILY     Neurology: Anticonvulsants - gabapentin Passed - 08/24/2021 10:05 AM  Passed - Cr in normal range and within 360 days    Creatinine, Ser  Date Value Ref Range Status  07/26/2021 1.02 0.76 - 1.27 mg/dL Final   Creatinine, POC  Date Value Ref Range Status  08/22/2015 n/a mg/dL Final         Passed - Completed PHQ-2 or PHQ-9 in the last 360 days      Passed - Valid encounter within last 12 months    Recent Outpatient Visits           3 weeks ago NAFLD (nonalcoholic fatty liver disease)   [place] Cannady, Jolene T, NP   4 weeks ago Uncontrolled type 2 diabetes mellitus with hyperglycemia ([place])   [place], Jolene T, NP   3 months ago Type 2 diabetes mellitus with proteinuria ([place])   [place], Jolene T, NP   4 months ago Uncontrolled type 2 diabetes mellitus with hyperglycemia ([place])   [place], Jolene T, NP   7 months ago Uncontrolled type 2 diabetes mellitus with hyperglycemia ([place])   [place], Barbaraann Faster, NP       Future Appointments             In 2 months Cannady, Barbaraann Faster, NP [facility]

## 2021-08-25 ENCOUNTER — Other Ambulatory Visit
Admission: RE | Admit: 2021-08-25 | Discharge: 2021-08-25 | Disposition: A | Discharge status: Home / Self Care | Payer: Medicare Other | Attending: Ophthalmology | Admitting: Ophthalmology

## 2021-08-25 DIAGNOSIS — H16001 Unspecified corneal ulcer, right eye: Secondary | ICD-10-CM | POA: Insufficient documentation

## 2021-08-25 DIAGNOSIS — H169 Unspecified keratitis: Secondary | ICD-10-CM | POA: Diagnosis not present

## 2021-08-26 DIAGNOSIS — H16001 Unspecified corneal ulcer, right eye: Secondary | ICD-10-CM | POA: Diagnosis not present

## 2021-08-28 ENCOUNTER — Telehealth: Payer: Self-pay

## 2021-08-28 ENCOUNTER — Ambulatory Visit (INDEPENDENT_AMBULATORY_CARE_PROVIDER_SITE_OTHER): Payer: Medicare Other

## 2021-08-28 DIAGNOSIS — H16001 Unspecified corneal ulcer, right eye: Secondary | ICD-10-CM | POA: Diagnosis not present

## 2021-08-28 DIAGNOSIS — E1129 Type 2 diabetes mellitus with other diabetic kidney complication: Secondary | ICD-10-CM

## 2021-08-28 DIAGNOSIS — E1169 Type 2 diabetes mellitus with other specified complication: Secondary | ICD-10-CM

## 2021-08-28 LAB — AEROBIC CULTURE W GRAM STAIN (SUPERFICIAL SPECIMEN)
Culture: NO GROWTH
Gram Stain: NONE SEEN

## 2021-08-28 NOTE — Progress Notes (Signed)
Chronic Care Management Pharmacy Note  08/28/2021 Name:  [name]. MRN:  [id number] DOB:  September 25, 1960  Summary: -scheduled with gastro, needing to schedule endocrinology  -some issues with pharmacy coordination and payment plans  Plan: -pt/wife call endocrinology, provided them with number ([facility] in [place]) -pt to call insurance, is looking over options with mail order. Offered to support locally delivery through [employer] if wanting to stay local. Will call next month to review.   Subjective: [name]. is an 61 y.o. year old male who is a primary patient of Cannady, Barbaraann Faster, NP.  The CCM team was consulted for assistance with disease management and care coordination needs.    Engaged with patient by telephone for follow up visit in response to provider referral for pharmacy case management and/or care coordination services.   Consent to Services:  The patient was given information about Chronic Care Management services, agreed to services, and gave verbal consent prior to initiation of services.  Please see initial visit note for detailed documentation.   Patient Care Team: [name], NP as PCP - General (Nurse Practitioner) Anell Barr, OD (Optometry) Dionisio David, MD as Consulting Physician (Cardiology) Vanita Ingles, RN as Case Manager ([place]) Rebekah Chesterfield, LCSW as Social Worker (Licensed Clinical Social Worker) Madelin Rear, [facility] (Pharmacist)  Hospital visits: None in previous 6 months  Objective:  Lab Results  Component Value Date   CREATININE 1.02 07/26/2021   CREATININE 1.44 (H) 04/26/2021   CREATININE 1.25 01/18/2021    Lab Results  Component Value Date   HGBA1C 8.8 (H) 07/26/2021   Last diabetic Eye exam:  Lab Results  Component Value Date/Time   HMDIABEYEEXA No Retinopathy 05/25/2021 12:00 AM    Last diabetic Foot exam: No results found for: "HMDIABFOOTEX"      Component Value  Date/Time   CHOL 170 04/26/2021 1425   CHOL 136 12/02/2018 1408   TRIG 240 (H) 04/26/2021 1425   TRIG 145 12/02/2018 1408   HDL 37 (L) 04/26/2021 1425   CHOLHDL 5.3 03/03/2019 0447   VLDL 44 (H) 03/03/2019 0447   VLDL 229 (H) 12/02/2018 1408   [place] 92 04/26/2021 1425       Latest Ref Rng & Units 04/26/2021    2:25 PM 01/18/2021    2:31 PM 06/01/2020    3:16 PM  Hepatic Function  Total Protein 6.0 - 8.5 g/dL 7.1  6.6  6.8   Albumin 3.8 - 4.9 g/dL 4.8  4.4  4.5   AST 0 - 40 IU/L _0 ALT 0 - 44 IU/L 36  27  29   Alk Phosphatase 44 - 121 IU/L 148  144  187   Total Bilirubin 0.0 - 1.2 mg/dL 0.5  0.3  0.3     Lab Results  Component Value Date/Time   TSH 1.130 04/26/2021 02:25 PM   TSH 0.881 06/01/2020 03:16 PM       Latest Ref Rng & Units 04/26/2021    2:25 PM 01/18/2021    2:31 PM 05/23/2020    4:15 PM  CBC  WBC 3.4 - 10.8 x10E3/uL 4.8  5.0  5.6   Hemoglobin 13.0 - 17.7 g/dL 14.6  13.4  13.7   Hematocrit 37.5 - 51.0 % 43.9  42.6  42.6   Platelets 150 - 450 x10E3/uL 174  176  196     Lab Results  Component Value Date/Time  VD25OH 22.5 (L) 04/26/2021 02:25 PM   VD25OH 37.7 06/28/2020 09:16 AM    Clinical ASCVD:  The 10-year ASCVD risk score (Arnett DK, et al., 2019) is: 18.6%   Values used to calculate the score:     Age: 44 years     Sex: Male     Is Non-Hispanic African American: No     Diabetic: Yes     Tobacco smoker: No     Systolic Blood Pressure: 098 mmHg     Is BP treated: Yes     HDL Cholesterol: 37 mg/dL     Total Cholesterol: 170 mg/dL     Social History   Tobacco Use  Smoking Status Never  Smokeless Tobacco Never   BP Readings from Last 3 Encounters:  08/01/21 136/78  07/26/21 136/80  07/24/21 (!) 153/77   Pulse Readings from Last 3 Encounters:  08/01/21 (!) 51  07/26/21 (!) 56  07/24/21 (!) 54   Wt Readings from Last 3 Encounters:  08/01/21 176 lb (79.8 kg)  07/26/21 178 lb 3.2 oz (80.8 kg)  07/10/21 177 lb (80.3 kg)     Assessment: Review of patient past medical history, allergies, medications, health status, including review of consultants reports, laboratory and other test data, was performed as part of comprehensive evaluation and provision of chronic care management services.   SDOH:  (Social Determinants of Health) assessments and interventions performed: Yes SDOH Interventions    Flowsheet Row Most Recent Value  SDOH Interventions   Food Insecurity Interventions Intervention Not Indicated  Transportation Interventions Intervention Not Indicated      Care Gaps - none noted Start Drugs - no gaps noted w/ statin, on insulin   CCM Care Plan  Allergies  Allergen Reactions   Bee Venom Hives    All kinds of bees   Other     Certain powders   Sulfa Antibiotics     Medications Reviewed Today     Reviewed by [name], NP (Nurse Practitioner) on 08/01/21 at 1352  Med List Status: <None>   Medication Order Taking? Sig Documenting Provider Last Dose Status Informant  aspirin 81 MG EC tablet [id number] Yes Take 1 tablet (81 mg total) by mouth daily. Marnee Guarneri T, NP Taking Active   atorvastatin (LIPITOR) 80 MG tablet [id number] Yes TAKE 1 TABLET BY MOUTH ONCE DAILY Cannady, Jolene T, NP Taking Active   Blood Glucose Monitoring Suppl (ONE TOUCH ULTRA 2) w/Device KIT [id number] Yes Use to check blood sugar 4 times a day Marnee Guarneri T, NP Taking Active   BREO ELLIPTA 100-25 MCG/ACT AEPB [id number] Yes INHALE 1 PUFF BY MOUTH ONCE DAILY Cannady, Jolene T, NP Taking Active   Cholecalciferol 1.25 MG (50000 UT) TABS [id number] Yes Take 1 tablet by mouth once a week. Marnee Guarneri T, NP Taking Active   clopidogrel (PLAVIX) 75 MG tablet [id number] Yes Take 1 tablet by mouth daily. [provider] Taking Active   empagliflozin (JARDIANCE) 25 MG TABS tablet [id number] Yes Take 1 tablet (25 mg total) by mouth daily. Marnee Guarneri T, NP Taking Active   EPINEPHRINE 0.3 mg/0.3 mL IJ SOAJ  injection [id number] Yes INJECT 1 SYRINGE INTO OUTER THIGH ONCE AS NEEDED FOR SEVERE ALLERGIC REACTION. Marnee Guarneri T, NP Taking Active   fenofibrate 54 MG tablet [id number] Yes TAKE 1 TABLET BY MOUTH ONCE DAILY Cannady, Jolene T, NP Taking Active   gabapentin (NEURONTIN) 300 MG capsule [id number] Yes TAKE 1 CAPSULE BY MOUTH TWICE DAILY Cannady, Jolene  T, NP Taking Active   glucose blood (ONETOUCH ULTRA) test strip [id number] Yes 1 each by Other route 5 (five) times daily. Marnee Guarneri T, NP Taking Active   insulin degludec (TRESIBA FLEXTOUCH) 100 UNIT/ML FlexTouch Pen [id number] Yes Take 45 units into the skin in morning and 45 units into skin every evening. Marnee Guarneri T, NP Taking Active   insulin lispro (HUMALOG KWIKPEN) 100 UNIT/ML KwikPen [id number] Yes Inject 8 Units into the skin 3 (three) times daily. Prior to breakfast, lunch, and dinner. Marnee Guarneri T, NP Taking Active   Insulin Pen Needle (PEN NEEDLES) 32G X 4 MM MISC [id number] Yes 1 kit by Does not apply route daily. [name], NP Taking Active   Lancets [facility] DELICA PLUS YJEHUD14H) MISC [id number] Yes USE AS DIRECTED. Marnee Guarneri T, NP Taking Active   lisinopril (ZESTRIL) 20 MG tablet [id number] Yes TAKE 1 TABLET BY MOUTH ONCE DAILY Cannady, Jolene T, NP Taking Active   Misc. Devices (PULSE OXIMETER FOR FINGER) MISC [id number] Yes To check O2 saturations once daily with asthma and document + check if any shortness of breath Cannady, Jolene T, NP Taking Active   montelukast (SINGULAIR) 10 MG tablet [id number] Yes TAKE 1 TABLET BY MOUTH AT BEDTIME Cannady, Jolene T, NP Taking Active   nitroGLYCERIN (NITROSTAT) 0.4 MG SL tablet [id number] Yes Place 1 tablet (0.4 mg total) under the tongue every 5 (five) minutes as needed for chest pain. Birdie Sons, MD Taking Active   omeprazole (PRILOSEC) 20 MG capsule [id number] Yes TAKE 1 CAPSULE BY MOUTH ONCE DAILY Cannady, Jolene T, NP Taking Active   tiZANidine (ZANAFLEX) 2 MG  tablet [id number] Yes Take 1 tablet (2 mg total) by mouth every 6 (six) hours as needed for muscle spasms. Marnee Guarneri T, NP Taking Active   triamcinolone cream (KENALOG) 0.1 % [id number] Yes Apply 1 application. topically 2 (two) times daily. Marnee Guarneri T, NP Taking Active   trimethoprim-polymyxin b (POLYTRIM) ophthalmic solution [id number] Yes Place 1 drop into the right eye every 6 (six) hours.  [provider] Taking Active   ULTRACARE PEN NEEDLES 32G X 5 MM MISC [id number] Yes  [provider] Taking Active   valACYclovir (VALTREX) 1000 MG tablet [id number] Yes Take 1,000 mg by mouth 3 (three) times daily. [provider] Taking Active   vitamin B-12 (CYANOCOBALAMIN) 1000 MCG tablet [id number] Yes Take 1 tablet (1,000 mcg total) by mouth daily. [name], NP Taking Active             Patient Active Problem List   Diagnosis Date Noted   Right hip pain 05/24/2021   B12 deficiency 04/26/2021   Heart murmur 09/21/2020   Type 2 diabetes mellitus with proteinuria ([place]) 03/25/2019   Atherosclerosis of both carotid arteries 03/22/2019   History of CVA (cerebrovascular accident) 03/06/2019   Uncontrolled type 2 diabetes mellitus with hyperglycemia ([place])    Obesity 09/03/2018   Frequent falls 05/15/2016   Vitamin D deficiency 08/22/2015   Carpal tunnel syndrome on left 05/12/2015   External hemorrhoids 12/20/2014   Allergic rhinitis 10/21/2014   Diverticulosis 10/21/2014   Hypertension associated with diabetes ([place]) 10/21/2014   Hyperlipidemia associated with type 2 diabetes mellitus ([place]) 10/21/2014   Asthma 10/21/2014   GERD (gastroesophageal reflux disease) 10/21/2014   NAFLD (nonalcoholic fatty liver disease) 10/21/2014   Arthritis 10/21/2014   History of pancreatitis 10/07/2014   Phenylketonuria (PKU) ([place]) 10/07/2014   Renal cyst, left 10/07/2014   Gall bladder polyp  10/01/2013    Immunization History  Administered Date(s) Administered    Hepatitis A, Adult 09/26/2016, 05/22/2017   Hepatitis B, adult 09/26/2016, 05/22/2017, 11/29/2017   Influenza,inj,Quad PF,6+ Mos 12/22/2012, 12/21/2013, 11/29/2017, 12/02/2018, 01/18/2021   Influenza-Unspecified 11/30/2014, 11/18/2015   Moderna Sars-Covid-2 Vaccination 11/16/2019, 12/14/2019, 12/14/2019   Pneumococcal Polysaccharide-23 12/22/2012, 11/18/2015   Td 01/18/2021    Conditions to be addressed/monitored: HTN, HLD, and DMII  Care Plan : [place]  Updates made by Madelin Rear, [facility] since 08/29/2021 12:00 AM     Problem: Diabetes, HTN,HLD, Asthma, GERD, NAFLD,   Priority: High     Long-Range Goal: Disease Management   Start Date: 05/22/2021  Recent Progress: Not on track  Priority: High  Note:   Current Barriers:  Unable to maintain control of DM  Pharmacist Clinical Goal(s):  Patient will verbalize ability to afford treatment regimen achieve adherence to monitoring guidelines and medication adherence to achieve therapeutic efficacy through collaboration with PharmD and provider.   Interventions: 1:1 collaboration with [name], NP regarding development and update of comprehensive plan of care as evidenced by provider attestation and co-signature Inter-disciplinary care team collaboration (see longitudinal plan of care) Comprehensive medication review performed; medication list updated in electronic medical record  Hyperlipidemia: (LDL goal < 70) -Uncontrolled -CVA Hx noted - high risk. LDL goal at least <70 -Current treatment: Fenofibrate 54 mg  Appropriate, query effective Atorvastatin 80 mg  Appropriate, query effective -reviewed for intolerance/side effects - no problems noted    -Educated on Cholesterol goals;  Benefits of statin for ASCVD risk reduction; -Counseled on diet and exercise extensively No updates, above goal last OV. Recommend zetia addition   Diabetes (A1c goal <8%) -Uncontrolled -Hx of pancreatitis, most recent GFRs  improved to 80s from 36s -Some issues with payment plans at current pharmacy  -NAFLD, persistent TG elevation -Current medications: Tresiba 48 units BID Appropriate, query effective Humalog 8 units before meals  Appropriate, query effective Jardiance 25 mg once daily Appropriate, query effective -Medications previously tried: stopped metformin after GFR decrease, glipizide (sulfa allergy) -Current home glucose readings Better diet, less soda, more water. Still reaching for chips. Likes greens. 153 fbg (140s-170s), 247 ppd (before 2 hours) June 2023 update: patient was intending to cut down on sweets, doing better with sodas.  Watching bread intake, salt  BG readings (random times) - 162, 153, 247, 184, 150 -Denies hypoglycemic/hyperglycemic symptoms -Educated on A1c and blood sugar goals; Prevention and management of hypoglycemic episodes; Benefits of routine self-monitoring of blood sugar; -Recommended to continue current medication Receives extra help on Rx copays - no current issues  PT to call endocrinology to set up appt. Patient Goals/Self-Care Activities Patient will:  - take medications as prescribed as evidenced by patient report and record review  Follow Up Plan: see summary Medication Assistance: None required.  Patient affirms current coverage meets needs.      Patient's preferred pharmacy is:  [facility], [place] [address] Phone: [phone] Fax: [phone]  Pt endorses 100% compliance  Follow Up:  Patient agrees to Care Plan and Follow-up.  Plan:   Future Appointments  Date Time Provider [place]  09/11/2021  1:15 PM CFP CCM SOCIAL WORK CFP-CFP PEC  09/13/2021  2:30 PM CFP CCM CASE MANAGER CFP-CFP PEC  10/16/2021  1:00 PM CFP CCM PHARMACY CFP-CFP PEC  10/27/2021  3:40 PM Marnee Guarneri T, NP CFP-CFP PEC  11/01/2021  1:00  PM AVVS VASC 3 AVVS-IMG None  11/01/2021  2:00 PM Kris Hartmann, NP  AVVS-AVVS None  12/13/2021  3:00 PM Vanga, Tally Due, MD AGI-AGIB None      Madelin Rear, PharmD, [facility] Clinical Pharmacist  [facility]  [phone]

## 2021-08-28 NOTE — Telephone Encounter (Signed)
  Care Management   Follow Up Note   08/28/2021 Name: [name]. MRN: [id number] DOB: 1960-08-12   Referred by: [name], NP Reason for referral : No chief complaint on file.   An unsuccessful telephone outreach was attempted today. The patient was referred to the case management team for assistance with care management and care coordination.   Follow Up Plan:  lvm for patient to call back, will notify care guide to RS as appropriate.   Madelin Rear, PharmD, [place] Clinical Pharmacist  [facility]  [phone]

## 2021-08-28 NOTE — Telephone Encounter (Signed)
Patient called back. Completed visit.

## 2021-08-29 NOTE — Patient Instructions (Signed)
[name],  Thank you for talking with me today. I have included our care plan/goals in the following pages.   Please review and call me at [phone] with any questions.  Thanks! Ellin Mayhew, PharmD Clinical Pharmacist  [phone]  Care Plan : [place]  Updates made by Madelin Rear, [facility] since 08/29/2021 12:00 AM     Problem: Diabetes, HTN,HLD, Asthma, GERD, NAFLD,   Priority: High     Long-Range Goal: Disease Management   Start Date: 05/22/2021  Recent Progress: Not on track  Priority: High  Note:   Current Barriers:  Unable to maintain control of DM  Pharmacist Clinical Goal(s):  Patient will verbalize ability to afford treatment regimen achieve adherence to monitoring guidelines and medication adherence to achieve therapeutic efficacy through collaboration with PharmD and provider.   Interventions: 1:1 collaboration with [name], NP regarding development and update of comprehensive plan of care as evidenced by provider attestation and co-signature Inter-disciplinary care team collaboration (see longitudinal plan of care) Comprehensive medication review performed; medication list updated in electronic medical record  Hyperlipidemia: (LDL goal < 70) -Uncontrolled -CVA Hx noted - high risk. LDL goal at least <70 -Current treatment: Fenofibrate 54 mg  Appropriate, query effective Atorvastatin 80 mg  Appropriate, query effective -reviewed for intolerance/side effects - no problems noted    -Educated on Cholesterol goals;  Benefits of statin for ASCVD risk reduction; -Counseled on diet and exercise extensively No updates, above goal last OV. Recommend zetia addition   Diabetes (A1c goal <8%) -Uncontrolled -Hx of pancreatitis, most recent GFRs improved to 80s from 58s -Some issues with payment plans at current pharmacy  -NAFLD, persistent TG elevation -Current medications: Tresiba 48 units BID Appropriate, query  effective Humalog 8 units before meals  Appropriate, query effective Jardiance 25 mg once daily Appropriate, query effective -Medications previously tried: stopped metformin after GFR decrease, glipizide (sulfa allergy) -Current home glucose readings Better diet, less soda, more water. Still reaching for chips. Likes greens. 153 fbg (140s-170s), 247 ppd (before 2 hours) June 2023 update: patient was intending to cut down on sweets, doing better with sodas.  Watching bread intake, salt  BG readings (random times) - 162, 153, 247, 184, 150 -Denies hypoglycemic/hyperglycemic symptoms -Educated on A1c and blood sugar goals; Prevention and management of hypoglycemic episodes; Benefits of routine self-monitoring of blood sugar; -Recommended to continue current medication Receives extra help on Rx copays - no current issues  PT to call endocrinology to set up appt. Patient Goals/Self-Care Activities Patient will:  - take medications as prescribed as evidenced by patient report and record review  Follow Up Plan: see summary Medication Assistance: None required.  Patient affirms current coverage meets needs.     The patient verbalized understanding of instructions provided today and agreed to receive a MyChart copy of patient instruction and/or educational materials. Telephone follow up appointment with pharmacy team member scheduled for: See next appointment with "Care Management Staff" under "What's Next" below.

## 2021-08-30 DIAGNOSIS — H169 Unspecified keratitis: Secondary | ICD-10-CM | POA: Diagnosis not present

## 2021-09-01 DIAGNOSIS — H16001 Unspecified corneal ulcer, right eye: Secondary | ICD-10-CM | POA: Diagnosis not present

## 2021-09-01 DIAGNOSIS — H169 Unspecified keratitis: Secondary | ICD-10-CM | POA: Diagnosis not present

## 2021-09-04 DIAGNOSIS — S6392XA Sprain of unspecified part of left wrist and hand, initial encounter: Secondary | ICD-10-CM | POA: Diagnosis not present

## 2021-09-04 DIAGNOSIS — M1611 Unilateral primary osteoarthritis, right hip: Secondary | ICD-10-CM | POA: Diagnosis not present

## 2021-09-08 DIAGNOSIS — H16001 Unspecified corneal ulcer, right eye: Secondary | ICD-10-CM | POA: Diagnosis not present

## 2021-09-11 ENCOUNTER — Ambulatory Visit: Payer: Medicare Other | Admitting: Licensed Clinical Social Worker

## 2021-09-11 DIAGNOSIS — E1159 Type 2 diabetes mellitus with other circulatory complications: Secondary | ICD-10-CM

## 2021-09-11 DIAGNOSIS — E1165 Type 2 diabetes mellitus with hyperglycemia: Secondary | ICD-10-CM

## 2021-09-12 DIAGNOSIS — M1611 Unilateral primary osteoarthritis, right hip: Secondary | ICD-10-CM | POA: Diagnosis not present

## 2021-09-13 ENCOUNTER — Telehealth: Payer: Self-pay

## 2021-09-13 ENCOUNTER — Telehealth: Payer: Medicare Other

## 2021-09-13 ENCOUNTER — Ambulatory Visit: Payer: Self-pay

## 2021-09-13 DIAGNOSIS — E1169 Type 2 diabetes mellitus with other specified complication: Secondary | ICD-10-CM

## 2021-09-13 DIAGNOSIS — E1129 Type 2 diabetes mellitus with other diabetic kidney complication: Secondary | ICD-10-CM

## 2021-09-13 DIAGNOSIS — E1159 Type 2 diabetes mellitus with other circulatory complications: Secondary | ICD-10-CM

## 2021-09-13 DIAGNOSIS — M1990 Unspecified osteoarthritis, unspecified site: Secondary | ICD-10-CM

## 2021-09-13 DIAGNOSIS — H44001 Unspecified purulent endophthalmitis, right eye: Secondary | ICD-10-CM

## 2021-09-13 DIAGNOSIS — M25551 Pain in right hip: Secondary | ICD-10-CM

## 2021-09-13 DIAGNOSIS — Q159 Congenital malformation of eye, unspecified: Secondary | ICD-10-CM

## 2021-09-13 DIAGNOSIS — E1165 Type 2 diabetes mellitus with hyperglycemia: Secondary | ICD-10-CM

## 2021-09-13 NOTE — Telephone Encounter (Signed)
Telephone encounter was:  Successful.  09/13/2021 Name: [name]. MRN: [id number] DOB: 03-18-1961  [name] is a 61 y.o. year old male who is a primary care patient of Cannady, Barbaraann Faster, NP . The community resource team was consulted for assistance with  meals   Care guide performed the following interventions: Patient provided with information about care guide support team and interviewed to confirm resource needs.Patient and wife will start getting meals from [employer] starting 09/14/2021  Follow Up Plan:  No further follow up planned at this time. The patient has been provided with needed resources.    [place], Care Management  [phone] [address], [place], [place] [address] Phone: [phone] Email: [email or web address]

## 2021-09-13 NOTE — Patient Instructions (Signed)
Visit Information  Thank you for taking time to visit with me today. Please don't hesitate to contact me if I can be of assistance to you before our next scheduled telephone appointment.  Following are the goals we discussed today:  Diabetes:  (Status: Goal on track: YES.)      Lab Results  Component Value Date    HGBA1C 8.8 (H) 07/26/2021  Previous >10. 01-18-2021 8.7 Assessed patient's understanding of A1c goal: <7%. 09-13-2021: Review with the patient and the patients wife the goal of A1C.  Provided education to patient about basic DM disease process. 09-13-2021: Review with the patients wife and patient about effective management of DM and the need to have stable blood sugars.; Reviewed medications with patient and discussed importance of medication adherence. 09-13-2021: The patient states that he is taking his medications as ordered        Reviewed prescribed diet with patient heart healthy/ADA diet. 06-14-2021: The patient is doing better with dietary restrictions and denies any issues with compliance. Review of dietary restrictions. The patients wife states that he is having better readings now. 09-13-2021: The patient is doing better per his wife. He is staying away from sodas and drinking more water. He is eating vegetables from the garden and doing much better with this.  He is still wanting to eat regular salt and his wife is working with him on this.  Counseled on importance of regular laboratory monitoring as prescribed. 09-13-2021: The patient has regular lab work         Discussed plans with patient for ongoing care management follow up and provided patient with direct contact information for care management team;      Provided patient with written educational materials related to hypo and hyperglycemia and importance of correct treatment. 09-13-2021: The patient is having higher than normal readings due to a virus in his eye. Reading this am is 265.   Reviewed scheduled/upcoming provider  appointments including: 10-27-2021 at 340 pm;         Advised patient, providing education and rationale, to check cbg as directed and record. 01-03-2021: The patient checked his blood sugar while on the phone with the [facility] and it was 149. The patient states he has been in the 100's range and hardly ever over 200. Praised the patient for positive changes. Will have a new A1C coming up for follow up with the pcp on 01-18-2021. Denies any acute findings. States he is eating healthier and staying away from sodas currently.   02-22-2021: Alc trending down, In office in November the A1C was 8.7 down from 9.9.  The patients blood sugar this am was 165. The patient and patients wife encouraged to keep regular checks on blood sugars and review of goal of <130 fasting and <180 post prandial. 04-12-2021: Is checking more frequently. States he is doing better with checking his blood sugars. Review of goals.    06-14-2021: See chart below for most recent blood sugar readings:   Blood Sugars Date AM Noon PM    06/08/2021 133   178    06/09/2021 186 162 153    06/10/2021 198 181 187    06/13/2021 158 159 196    06/14/2021 188 198    09-13-2021: The patient is having ups and downs with his blood sugars. Range is 150 to 265. Education and support given.  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 04-12-2021: The patient states that he is seeing better and they have put sensors  in his eyes and he can see better. He is following with specialist and will be likely having cataract surgery. 06-14-2021: The patient is doing better and having cataract surgery on his right eye on 07-10-2021. He had cataract surgery on his left eye on 06-05-2021 and did well. Has 20/20 vision in that eye now. Goes tomorrow for reevaluation due to the eye conditions he has been dealing with. 09-13-2021: The patient has a virus in his left eye currently and is being treated for this. He goes tomorrow for follow up with the eye doctor. He is having to put drops in his eye. They are hoping that they can save his vision. Education and support given. Will continue to monitor. 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 States that they were on back order. He can see some today out of his right eye, education and support given. 04-12-2021: The patient is seeing the specialist weekly and is taking medications as prescribed. 09-13-2021: The patient is compliant with medications.  ; Reviewed scheduled/upcoming provider appointments including appointment with eye doctor on 09-14-2021 Discussed plans with patient for ongoing care management follow up and provided patient with direct contact information for care management team;   Hyperlipidemia:  (Status: Goal on Track (progressing): YES.)      Lab Results  Component Value Date    CHOL 170 04/26/2021    HDL 37 (L) 04/26/2021    LDLCALC 92 04/26/2021    TRIG 240 (H)  04/26/2021    CHOLHDL 5.3 03/03/2019      Medication review performed; medication list updated in electronic medical record. Atorvastatin 80 mg QD Provider established cholesterol goals reviewed. 09-13-2021: Education and support given; Counseled on importance of regular laboratory monitoring as prescribed; Provided HLD educational materials; Reviewed role and benefits of statin for ASCVD risk reduction; Discussed strategies to manage statin-induced myalgias; Reviewed importance of limiting foods high in cholesterol. 09-13-2021: Education provided on Heart Healthy/ADA diet. Extensive review and education given.  Reviewed exercise goals and target of 150 minutes per week;   Hypertension: (Status: Goal on track: YES.) Last practice recorded BP readings:     BP Readings from Last 3 Encounters:  08/01/21 136/78  07/26/21 136/80  07/24/21 (!) 153/77  Most recent eGFR/CrCl:       Lab Results  Component Value Date    EGFR 85 07/26/2021    No components found for: CRCL  Evaluation of current treatment plan related to hypertension self management and patient's adherence to plan as established by provider. 04-12-2021: The patient is doing well and has no question related to heart health. Will reschedule follow up with specialist to have an ECHO. Missed appointment due to having issues with his eye. Denies any acute findings. 06-14-2021: The patient is seeing the heart specialist. The patient is doing well and maintaining his health and well being. 09-13-2021: The patient is doing well with heart health. Sees specialist on a regular basis. HTN stable.;   Provided education to patient re: stroke prevention, s/s of heart attack and stroke; Reviewed prescribed diet heart healthy/ADA. 09-13-2021: Reviewed. Per the patients wife he is still using a lot of salt in his diet. The patients wife is trying to get him to cut back on his consumption of salt. Education and support given. Reviewed medications with  patient and discussed importance of compliance. 09-13-2021: States compliance with medications, wife assist with medications management. Per the patient he is supposed to stop his blood thinners. Discussed with the wife the need to talk to the specialist and cardiologist before stopping blood thinners due to past history of stroke. Advised that usually when a patient has a procedure they sometimes need to stop certain medications for the procedure. Extensive review on making sure they have a clear understanding before stopping the ASA or plavix. Teach back method used to make sure the patients wife understood instructions given. Ask the patient wife to write down questions to ask when they call to schedule the injections for the patient to have in his right hip.  Counseled on adverse effects of illicit drug and excessive alcohol use in patients with high blood pressure;  Counseled on the importance of exercise goals with target of 150 minutes per week Discussed plans with patient for ongoing care management follow up and provided patient with direct contact information for care management team; Advised patient, providing education and rationale, to monitor blood pressure daily and record, calling PCP for findings outside established parameters;  Reviewed scheduled/upcoming provider appointments including:10-27-2021 at 340 pm     Pain:  (Status: New goal.) Long Term Goal added on 09-13-2021: The patient is having right hip pain- new onset Pain assessment performed. 09-13-2021: The patient is complaining of right hip pain rating the pain at a 10 today on a scale of 0-10 Medications reviewed. 09-13-2021: The patient is waiting to hear from general surgery about a procedure they are going to do to help his hip pain. Extensive review with the patient about following the instructions from the provider Reviewed provider established plan for pain management. 09-13-2021: Per the patients wife they are going to try a  procedure to see if that will help with the pain and discomfort the patient is experiencing in his right hip. If that does not work they will likely do surgery; Discussed importance of adherence to all scheduled medical appointments. Sees pcp on 10-27-2021 will have procedure on hip when office calls to set this up Counseled on the importance of reporting any/all new or changed pain symptoms or management strategies to pain management provider; Advised patient to report to care team affect of pain on daily activities; Discussed use of relaxation techniques and/or diversional activities to assist with pain reduction (distraction, imagery, relaxation, massage, acupressure, TENS, heat, and cold application; Reviewed with patient prescribed pharmacological and nonpharmacological pain relief strategies; Advised patient to discuss uncontrolled pain, changes in level or intensity of pain with provider;  Screening for signs and symptoms of depression related to chronic disease state;  Assessed social determinant of health barriers;      Our next appointment is by telephone on 11-08-2021 at 1 pm  Please call the care guide team at [phone] if you need to cancel or reschedule your appointment.   If you are experiencing a Mental Health or [place] or need someone to talk to, please call the Suicide and Crisis Lifeline: 988 call the [place] National Suicide Prevention Lifeline: 508-060-9912 or TTY: (904)213-5146 TTY 913-238-8761) to talk to a trained counselor call 1-800-273-TALK (toll free, 24 hour hotline)   The patient verbalized understanding of instructions, educational materials, and care plan provided today and DECLINED offer to receive copy of patient instructions, educational materials, and care plan.   Noreene Larsson RN, MSN, [facility] Mobile: [phone]

## 2021-09-13 NOTE — Chronic Care Management (AMB) (Signed)
Chronic Care Management   CCM RN Visit Note  09/13/2021 Name: [name]. MRN: [id number] DOB: [birth year]  Subjective: [name]. is a 61 y.o. year old male who is a primary care patient of Cannady, Barbaraann Faster, NP. The care management team was consulted for assistance with disease management and care coordination needs.    Engaged with patient by telephone for follow up visit in response to provider referral for case management and/or care coordination services.   Consent to Services:  The patient was given information about Chronic Care Management services, agreed to services, and gave verbal consent prior to initiation of services.  Please see initial visit note for detailed documentation.   Patient agreed to services and verbal consent obtained.   Assessment: Review of patient past medical history, allergies, medications, health status, including review of consultants reports, laboratory and other test data, was performed as part of comprehensive evaluation and provision of chronic care management services.   SDOH (Social Determinants of Health) assessments and interventions performed:    CCM Care Plan  Allergies  Allergen Reactions   Bee Venom Hives    All kinds of bees   Other     Certain powders   Sulfa Antibiotics     Outpatient Encounter Medications as of 09/13/2021  Medication Sig   aspirin 81 MG EC tablet Take 1 tablet (81 mg total) by mouth daily.   atorvastatin (LIPITOR) 80 MG tablet TAKE 1 TABLET BY MOUTH ONCE DAILY   Blood Glucose Monitoring Suppl (ONE TOUCH ULTRA 2) w/Device KIT Use to check blood sugar 4 times a day   BREO ELLIPTA 100-25 MCG/ACT AEPB INHALE 1 PUFF BY MOUTH ONCE DAILY   Cholecalciferol 1.25 MG (50000 UT) TABS Take 1 tablet by mouth once a week.   clopidogrel (PLAVIX) 75 MG tablet Take 1 tablet by mouth daily.   EPINEPHRINE 0.3 mg/0.3 mL IJ SOAJ injection INJECT 1 SYRINGE INTO OUTER THIGH ONCE AS NEEDED FOR SEVERE ALLERGIC  REACTION.   fenofibrate 54 MG tablet TAKE 1 TABLET BY MOUTH ONCE DAILY   gabapentin (NEURONTIN) 300 MG capsule TAKE 1 CAPSULE BY MOUTH TWICE DAILY   glucose blood (ONETOUCH ULTRA) test strip 1 each by Other route 5 (five) times daily.   insulin degludec (TRESIBA FLEXTOUCH) 100 UNIT/ML FlexTouch Pen Take 45 units into the skin in morning and 45 units into skin every evening.   insulin lispro (HUMALOG KWIKPEN) 100 UNIT/ML KwikPen Inject 8 Units into the skin 3 (three) times daily. Prior to breakfast, lunch, and dinner.   Insulin Pen Needle (PEN NEEDLES) 32G X 4 MM MISC 1 kit by Does not apply route daily.   JARDIANCE 25 MG TABS tablet TAKE 1 TABLET BY MOUTH ONCE DAILY   Lancets (ONETOUCH DELICA PLUS [id number]) MISC USE AS DIRECTED.   lisinopril (ZESTRIL) 20 MG tablet TAKE 1 TABLET BY MOUTH ONCE DAILY   Misc. Devices (PULSE OXIMETER FOR FINGER) MISC To check O2 saturations once daily with asthma and document + check if any shortness of breath   montelukast (SINGULAIR) 10 MG tablet TAKE 1 TABLET BY MOUTH AT BEDTIME   nitroGLYCERIN (NITROSTAT) 0.4 MG SL tablet Place 1 tablet (0.4 mg total) under the tongue every 5 (five) minutes as needed for chest pain.   omeprazole (PRILOSEC) 20 MG capsule TAKE 1 CAPSULE BY MOUTH ONCE DAILY   tiZANidine (ZANAFLEX) 2 MG tablet Take 1 tablet (2 mg total) by mouth every 6 (six) hours as needed for muscle  spasms.   triamcinolone cream (KENALOG) 0.1 % Apply 1 application. topically 2 (two) times daily.   trimethoprim-polymyxin b (POLYTRIM) ophthalmic solution Place 1 drop into the right eye every 6 (six) hours.    ULTRACARE PEN NEEDLES 32G X 5 MM MISC    valACYclovir (VALTREX) 1000 MG tablet Take 1,000 mg by mouth 3 (three) times daily.   vitamin B-12 (CYANOCOBALAMIN) 1000 MCG tablet Take 1 tablet (1,000 mcg total) by mouth daily.   No facility-administered encounter medications on file as of 09/13/2021.    Patient Active Problem List   Diagnosis Date Noted   Right  hip pain 05/24/2021   B12 deficiency 04/26/2021   Heart murmur 09/21/2020   Type 2 diabetes mellitus with proteinuria ([place]) 03/25/2019   Atherosclerosis of both carotid arteries 03/22/2019   History of CVA (cerebrovascular accident) 03/06/2019   Uncontrolled type 2 diabetes mellitus with hyperglycemia ([place])    Obesity 09/03/2018   Frequent falls 05/15/2016   Vitamin D deficiency 08/22/2015   Carpal tunnel syndrome on left 05/12/2015   External hemorrhoids 12/20/2014   Allergic rhinitis 10/21/2014   Diverticulosis 10/21/2014   Hypertension associated with diabetes ([place]) 10/21/2014   Hyperlipidemia associated with type 2 diabetes mellitus ([place]) 10/21/2014   Asthma 10/21/2014   GERD (gastroesophageal reflux disease) 10/21/2014   NAFLD (nonalcoholic fatty liver disease) 10/21/2014   Arthritis 10/21/2014   History of pancreatitis 10/07/2014   Phenylketonuria (PKU) ([place]) 10/07/2014   Renal cyst, left 10/07/2014   Gall bladder polyp 10/01/2013    Conditions to be addressed/monitored:HTN, HLD, DMII, and eye issues and chronic pain   Care Plan : RNCM; General Plan of Care (Adult) for Chronic Disease Management and Care Coordination Needs  Updates made by Vanita Ingles, RN since 09/13/2021 12:00 AM     Problem: RNCM: Development of Care Plan for Chronic Disease Management and Care Coordination Needs (DM, HTN, HLD, Eye sty/ulcer/cataract   Priority: High     Long-Range Goal: RNCM: Development of Care Plan for Chronic Disease Management and Care Coordination Needs (DM, HTN, HLD, Eye sty/ulcer/cataract   Start Date: 01/03/2021  Expected End Date: 01/03/2022  Priority: High  Note:   Current Barriers:  Knowledge Deficits related to plan of care for management of HTN, HLD, DMII, and eye sty/ulcer   Care Coordination needs related to Financial constraints related to limited income, Limited social support, Transportation, and Level of care concerns  Chronic Disease Management support and  education needs related to HTN, HLD, DMII, and eye sty/ulcer- right 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  attend all scheduled medical appointments: with pcp and specialist on a regular basis demonstrate improved and ongoing adherence to prescribed treatment plan for HTN, HLD, DMII, and eye sty/ulcer as evidenced by daily monitoring and recording of CBG  adherence to ADA/ carb modified diet adherence to prescribed medication regimen contacting provider for new or worsened symptoms or questions  demonstrate improved and ongoing health management independence for chronic conditions as evidence of effective management and no acute exacerbations continue to work  with RN Care Manager and/or Social Worker to address care management and care coordination needs related to HTN, HLD, DMII, and eye sty/ulcer  demonstrate a decrease in HTN, HLD, DMII, and eye styl/ulcer exacerbations  demonstrate ongoing self health care management ability  through collaboration with RN Care manager, provider, and care team.   Interventions: 1:1 collaboration with primary care provider regarding development and update of comprehensive plan of care as evidenced by provider attestation and co-signature Inter-disciplinary care team collaboration (see longitudinal plan of care) Evaluation of current treatment plan related to  self management and patient's adherence to plan as established by  provider   SDOH Barriers (Status: Goal on track: YES.)  Patient interviewed and SDOH assessment performed        SDOH Interventions    Flowsheet Row Most Recent Value  SDOH Interventions   Food Insecurity Interventions Intervention Not Indicated  Financial Strain Interventions Other (Comment)  [continued support from care guides and resources]  Housing Interventions Intervention Not Indicated  Intimate Partner Violence Interventions Intervention Not Indicated  Physical Activity Interventions Other (Comments)  [is active but no structured activity]  Stress Interventions Intervention Not Indicated  Social Connections Interventions Intervention Not Indicated, Other (Comment)  [good support system]  Transportation Interventions [employer]     Patient interviewed and appropriate assessments performed Provided patient with information about resources available. The patient and his wife have worked with care guides before and know the process for getting help in the community for expressed needs Advised patient to call the office for changes in [place], questions or concerns, the patient states they are getting food stamps now and are thankful for the food stamps Provided education to patient/caregiver regarding level of care options.    Diabetes:  (Status: Goal on track: YES.) Lab Results  Component Value Date   HGBA1C 8.8 (H) 07/26/2021  Previous >10. 01-18-2021 8.7 Assessed patient's understanding of A1c goal: <7%. 09-13-2021: Review with the patient and the patients wife the goal of A1C.  Provided education to patient about basic DM disease process. 09-13-2021: Review with the patients wife and patient about effective management of DM and the need to have stable blood sugars.; Reviewed medications with patient and discussed importance of medication adherence. 09-13-2021: The patient states that he is taking his medications as ordered        Reviewed prescribed diet with  patient heart healthy/ADA diet. 06-14-2021: The patient is doing better with dietary restrictions and denies any issues with compliance. Review of dietary restrictions. The patients wife states that he is having better readings now. 09-13-2021: The patient is doing better per his wife. He is staying away from sodas and drinking more water. He is eating vegetables from the garden and doing much better with this.  He is still wanting to eat regular salt and his wife is working with him on this.  Counseled on importance of regular laboratory monitoring as prescribed. 09-13-2021: The patient has regular lab work         Discussed plans with patient for ongoing care management follow up and provided patient with direct contact information for care management team;      Provided patient with written educational materials related to hypo and hyperglycemia and importance of correct treatment. 09-13-2021: The patient is having higher than normal readings due to a virus in his eye. Reading this am is 265.   Reviewed scheduled/upcoming provider appointments including: 10-27-2021 at 340 pm;  Advised patient, providing education and rationale, to check cbg as directed and record. 01-03-2021: The patient checked his blood sugar while on the phone with the [facility] and it was 149. The patient states he has been in the 100's range and hardly ever over 200. Praised the patient for positive changes. Will have a new A1C coming up for follow up with the pcp on 01-18-2021. Denies any acute findings. States he is eating healthier and staying away from sodas currently.   02-22-2021: Alc trending down, In office in November the A1C was 8.7 down from 9.9.  The patients blood sugar this am was 165. The patient and patients wife encouraged to keep regular checks on blood sugars and review of goal of <130 fasting and <180 post prandial. 04-12-2021: Is checking more frequently. States he is doing better with checking his blood sugars. Review of  goals.    06-14-2021: See chart below for most recent blood sugar readings:   Blood Sugars Date AM Noon PM   06/08/2021 133  178   06/09/2021 186 162 153   06/10/2021 198 181 187   06/13/2021 158 159 196   06/14/2021 188 198   09-13-2021: The patient is having ups and downs with his blood sugars. Range is 150 to 265. Education and support given.  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 04-12-2021: The patient states that he is seeing better and they have put sensors in his eyes and he can see better. He is following with specialist and will be likely having cataract surgery. 06-14-2021: The patient is doing better and having cataract surgery on his right eye on 07-10-2021. He had cataract surgery on his left eye on 06-05-2021 and did well. Has 20/20 vision in that eye now. Goes tomorrow for reevaluation due to the eye conditions he has been dealing with. 09-13-2021: The patient has a virus in his left eye currently and is being treated for this. He goes tomorrow for follow up with the eye doctor. He is having to put drops in his eye. They are hoping that they can save his vision. Education and support given. Will continue to monitor. Discussed plans with patient for ongoing care management follow up and provided patient with direct contact information for care management team Advised patient to call the specialist for changes in his vision, pain, or concerns  related to right eye with clogged sty and ulcer noted; Provided education to patient re: keeping appointments, monitoring for changes in vision or other complications; Reviewed medications with patient and discussed 01-03-2021: The patient states that he just got his abx drops today for his eye and he is using them. States that they were on back order. He can see some today out of his right eye, education and support given. 04-12-2021: The patient is seeing the specialist weekly and is taking medications as prescribed. 09-13-2021: The patient  is compliant with medications.  ; Reviewed scheduled/upcoming provider appointments including appointment with eye doctor on 09-14-2021 Discussed plans with patient for ongoing care management follow up and provided patient with direct contact information for care management team;  Hyperlipidemia:  (Status: Goal on Track (progressing): YES.) Lab Results  Component Value Date   CHOL 170 04/26/2021   HDL 37 (L) 04/26/2021   LDLCALC 92 04/26/2021   TRIG 240 (H) 04/26/2021   CHOLHDL 5.3 03/03/2019     Medication review performed; medication list updated in electronic medical record. Atorvastatin 80 mg QD Provider established cholesterol goals reviewed. 09-13-2021: Education and support given; Counseled on importance of regular laboratory monitoring as prescribed; Provided HLD educational materials; Reviewed role and benefits of statin for ASCVD risk reduction; Discussed strategies to manage statin-induced myalgias; Reviewed importance of limiting foods high in cholesterol. 09-13-2021: Education provided on Heart Healthy/ADA diet. Extensive review and education given.  Reviewed exercise goals and target of 150 minutes per week;  Hypertension: (Status: Goal on track: YES.) Last practice recorded BP readings:  BP Readings from Last 3 Encounters:  08/01/21 136/78  07/26/21 136/80  07/24/21 (!) 153/77  Most recent eGFR/CrCl:  Lab Results  Component Value Date    EGFR 85 07/26/2021    No components found for: CRCL  Evaluation of current treatment plan related to hypertension self management and patient's adherence to plan as established by provider. 04-12-2021: The patient is doing well and has no question related to heart health. Will reschedule follow up with specialist to have an ECHO. Missed appointment due to having issues with his eye. Denies any acute findings. 06-14-2021: The patient is seeing the heart specialist. The patient is doing well and maintaining his health and well being. 09-13-2021: The patient is doing well with heart health. Sees specialist on a regular basis. HTN stable.;   Provided education to patient re: stroke prevention, s/s of heart attack and stroke; Reviewed prescribed diet heart healthy/ADA. 09-13-2021: Reviewed. Per the patients wife he is still using a lot of salt in his diet. The patients wife is trying to get him to cut back on his consumption of salt. Education and support given. Reviewed medications with patient and discussed importance of compliance. 09-13-2021: States compliance with medications, wife assist with medications management. Per the patient he is supposed to stop his blood thinners. Discussed with the wife the need to talk to the specialist and cardiologist before stopping blood thinners due to past history of stroke. Advised that usually when a patient has a procedure they sometimes need to stop certain medications for the procedure. Extensive review on making sure they have a clear understanding before stopping the ASA or plavix. Teach back method used to make sure the patients wife understood instructions given. Ask the patient wife to write down questions to ask when they call to schedule the injections for the patient to have in his right hip.  Counseled on adverse effects of illicit drug and excessive alcohol use in patients with high blood pressure;  Counseled on the importance of exercise goals with target of  150 minutes per week Discussed plans with patient for ongoing care management follow up and provided patient with direct contact information for care management team; Advised patient, providing education and rationale, to monitor blood pressure daily and record, calling PCP for findings outside established parameters;  Reviewed scheduled/upcoming provider appointments including:10-27-2021 at 340 pm   Pain:  (Status: New goal.) Long Term Goal added on 09-13-2021: The patient is  having right hip pain- new onset Pain assessment performed. 09-13-2021: The patient is complaining of right hip pain rating the pain at a 10 today on a scale of 0-10 Medications reviewed. 09-13-2021: The patient is waiting to hear from general surgery about a procedure they are going to do to help his hip pain. Extensive review with the patient about following the instructions from the provider Reviewed provider established plan for pain management. 09-13-2021: Per the patients wife they are going to try a procedure to see if that will help with the pain and discomfort the patient is experiencing in his right hip. If that does not work they will likely do surgery; Discussed importance of adherence to all scheduled medical appointments. Sees pcp on 10-27-2021 will have procedure on hip when office calls to set this up Counseled on the importance of reporting any/all new or changed pain symptoms or management strategies to pain management provider; Advised patient to report to care team affect of pain on daily activities; Discussed use of relaxation techniques and/or diversional activities to assist with pain reduction (distraction, imagery, relaxation, massage, acupressure, TENS, heat, and cold application; Reviewed with patient prescribed pharmacological and nonpharmacological pain relief strategies; Advised patient to discuss uncontrolled pain, changes in level or intensity of pain with provider; Screening for signs and symptoms of  depression related to chronic disease state;  Assessed social determinant of health barriers;    Patient Goals/Self-Care Activities: Patient will self administer medications as prescribed as evidenced by self report/primary caregiver report  Patient will attend all scheduled provider appointments as evidenced by clinician review of documented attendance to scheduled appointments and patient/caregiver report Patient will call pharmacy for medication refills as evidenced by patient report and review of pharmacy fill history as appropriate Patient will attend church or other social activities as evidenced by patient report Patient will continue to perform ADL's independently as evidenced by patient/caregiver report Patient will continue to perform IADL's independently as evidenced by patient/caregiver report Patient will call provider office for new concerns or questions as evidenced by review of documented incoming telephone call notes and patient report Patient will work with BSW to address care coordination needs and will continue to work with the clinical team to address health care and disease management related needs as evidenced by documented adherence to scheduled care management/care coordination appointments - keep appointment with eye doctor - check blood sugar at prescribed times: before meals and at bedtime, when you have symptoms of low or high blood sugar, and before and after exercise - check feet daily for cuts, sores or redness - enter blood sugar readings and medication or insulin into daily log - take the blood sugar log to all doctor visits - trim toenails straight across - drink 6 to 8 glasses of water each day - eat fish at least once per week - fill half of plate with vegetables - limit fast food meals to no more than 1 per week - manage portion size - prepare main meal at home 3 to 5 days each week - read food labels for fat, fiber, carbohydrates and portion size -  reduce red meat to 2 to 3 times a week - keep feet up while sitting - wash and dry feet carefully every day - wear comfortable, cotton socks - wear comfortable, well-fitting shoes - check blood pressure weekly - choose a place to take my blood pressure (home, clinic or office, retail store) - write blood pressure results in a log or diary -  learn about high blood pressure - keep a blood pressure log - take blood pressure log to all doctor appointments - call doctor for signs and symptoms of high blood pressure - keep all doctor appointments - take medications for blood pressure exactly as prescribed - report new symptoms to your doctor - eat more whole grains, fruits and vegetables, lean meats and healthy fats - call for medicine refill 2 or 3 days before it runs out - take all medications exactly as prescribed - call doctor with any symptoms you believe are related to your medicine - call doctor when you experience any new symptoms - go to all doctor appointments as scheduled - adhere to prescribed diet: heart healthy/ADA diet        Plan:Telephone follow up appointment with care management team member scheduled for:  11-08-2021 at 1 pm  [place], MSN, [facility] Mobile: [phone]

## 2021-09-13 NOTE — Telephone Encounter (Signed)
Telephone encounter was:  Successful.  09/13/2021 Name: [name]. MRN: [id number] DOB: August 31, 1960  Alex Metro. is a 61 y.o. year old male who is a primary care patient of Cannady, Barbaraann Faster, NP . The community resource team was consulted for assistance with [place] guide performed the following interventions: Patient provided with information about care guide support team and interviewed to confirm resource needs.Patients wife stated her and her husband are disabled and need help with food resouces.  Follow Up Plan:  Care guide will follow up with patient by phone over the next [place], [place], Care Management  [phone], [place], [place] [address] Phone: [phone] Email: [email or web address]

## 2021-09-14 NOTE — Chronic Care Management (AMB) (Signed)
Chronic Care Management    Clinical Social Work Note  09/14/2021 Name: [name]. MRN: [id number] DOB: 03/17/1961  Alex Metro. is a 61 y.o. year old male who is a primary care patient of Cannady, Barbaraann Faster, NP. The CCM team was consulted to assist the patient with chronic disease management and/or care coordination needs related to: Mental Health Counseling and Resources and Grief Counseling.   Engaged with patient and spouse by telephone for follow up visit in response to provider referral for social work chronic care management and care coordination services.   Consent to Services:  The patient was given information about Chronic Care Management services, agreed to services, and gave verbal consent prior to initiation of services.  Please see initial visit note for detailed documentation.   Patient agreed to services and consent obtained.   Assessment: Review of patient past medical history, allergies, medications, and health status, including review of relevant consultants reports was performed today as part of a comprehensive evaluation and provision of chronic care management and care coordination services.     SDOH (Social Determinants of Health) assessments and interventions performed:    Advanced Directives Status: Not addressed in this encounter.  CCM Care Plan  Allergies  Allergen Reactions   Bee Venom Hives    All kinds of bees   Other     Certain powders   Sulfa Antibiotics     Outpatient Encounter Medications as of 09/11/2021  Medication Sig   aspirin 81 MG EC tablet Take 1 tablet (81 mg total) by mouth daily.   atorvastatin (LIPITOR) 80 MG tablet TAKE 1 TABLET BY MOUTH ONCE DAILY   Blood Glucose Monitoring Suppl (ONE TOUCH ULTRA 2) w/Device KIT Use to check blood sugar 4 times a day   BREO ELLIPTA 100-25 MCG/ACT AEPB INHALE 1 PUFF BY MOUTH ONCE DAILY   Cholecalciferol 1.25 MG (50000 UT) TABS Take 1 tablet by mouth once a week.   clopidogrel  (PLAVIX) 75 MG tablet Take 1 tablet by mouth daily.   EPINEPHRINE 0.3 mg/0.3 mL IJ SOAJ injection INJECT 1 SYRINGE INTO OUTER THIGH ONCE AS NEEDED FOR SEVERE ALLERGIC REACTION.   fenofibrate 54 MG tablet TAKE 1 TABLET BY MOUTH ONCE DAILY   gabapentin (NEURONTIN) 300 MG capsule TAKE 1 CAPSULE BY MOUTH TWICE DAILY   glucose blood (ONETOUCH ULTRA) test strip 1 each by Other route 5 (five) times daily.   insulin degludec (TRESIBA FLEXTOUCH) 100 UNIT/ML FlexTouch Pen Take 45 units into the skin in morning and 45 units into skin every evening.   insulin lispro (HUMALOG KWIKPEN) 100 UNIT/ML KwikPen Inject 8 Units into the skin 3 (three) times daily. Prior to breakfast, lunch, and dinner.   Insulin Pen Needle (PEN NEEDLES) 32G X 4 MM MISC 1 kit by Does not apply route daily.   JARDIANCE 25 MG TABS tablet TAKE 1 TABLET BY MOUTH ONCE DAILY   Lancets (ONETOUCH DELICA PLUS [id number]) MISC USE AS DIRECTED.   lisinopril (ZESTRIL) 20 MG tablet TAKE 1 TABLET BY MOUTH ONCE DAILY   Misc. Devices (PULSE OXIMETER FOR FINGER) MISC To check O2 saturations once daily with asthma and document + check if any shortness of breath   montelukast (SINGULAIR) 10 MG tablet TAKE 1 TABLET BY MOUTH AT BEDTIME   nitroGLYCERIN (NITROSTAT) 0.4 MG SL tablet Place 1 tablet (0.4 mg total) under the tongue every 5 (five) minutes as needed for chest pain.   omeprazole (PRILOSEC) 20 MG capsule TAKE 1  CAPSULE BY MOUTH ONCE DAILY   tiZANidine (ZANAFLEX) 2 MG tablet Take 1 tablet (2 mg total) by mouth every 6 (six) hours as needed for muscle spasms.   triamcinolone cream (KENALOG) 0.1 % Apply 1 application. topically 2 (two) times daily.   trimethoprim-polymyxin b (POLYTRIM) ophthalmic solution Place 1 drop into the right eye every 6 (six) hours.    ULTRACARE PEN NEEDLES 32G X 5 MM MISC    valACYclovir (VALTREX) 1000 MG tablet Take 1,000 mg by mouth 3 (three) times daily.   vitamin B-12 (CYANOCOBALAMIN) 1000 MCG tablet Take 1 tablet (1,000  mcg total) by mouth daily.   No facility-administered encounter medications on file as of 09/11/2021.    Patient Active Problem List   Diagnosis Date Noted   Right hip pain 05/24/2021   B12 deficiency 04/26/2021   Heart murmur 09/21/2020   Type 2 diabetes mellitus with proteinuria ([place]) 03/25/2019   Atherosclerosis of both carotid arteries 03/22/2019   History of CVA (cerebrovascular accident) 03/06/2019   Uncontrolled type 2 diabetes mellitus with hyperglycemia ([place])    Obesity 09/03/2018   Frequent falls 05/15/2016   Vitamin D deficiency 08/22/2015   Carpal tunnel syndrome on left 05/12/2015   External hemorrhoids 12/20/2014   Allergic rhinitis 10/21/2014   Diverticulosis 10/21/2014   Hypertension associated with diabetes ([place]) 10/21/2014   Hyperlipidemia associated with type 2 diabetes mellitus ([place]) 10/21/2014   Asthma 10/21/2014   GERD (gastroesophageal reflux disease) 10/21/2014   NAFLD (nonalcoholic fatty liver disease) 10/21/2014   Arthritis 10/21/2014   History of pancreatitis 10/07/2014   Phenylketonuria (PKU) ([place]) 10/07/2014   Renal cyst, left 10/07/2014   Gall bladder polyp 10/01/2013    Conditions to be addressed/monitored: HTN and DMII; Grief and Stress  Care Plan : General Social Work (Adult)  Updates made by Rebekah Chesterfield, LCSW since 09/14/2021 12:00 AM     Problem: Coping Skills (General Plan of Care)      Long-Range Goal: Coping Skills Enhanced Completed 09/11/2021  Start Date: 05/16/2020  This Visit's Progress: On track  Recent Progress: On track  Priority: Medium  Note:   Current Barriers:  Financial constraints Limited social support Level of care concerns ADL IADL limitations Literacy concerns Cognitive Deficits Lacks knowledge of community resource: available financial support resources within the area as well as socialization opportunities within the area Grief support related to the recent loss of his mother Clinical Social Work  Clinical Goal(s):  Over the next 120 days, client will work with SW to address concerns related to improving self-care and implementing it whenever able Interventions: Patient and spouse reports increase in stress after pt caught a virus after having an eye surgery. They are waiting for biopsy results.  Family are "taking things one day at a time" Stress management strategies discussed Patient and spouse receives strong support from family to assist them with paying rent online Grief symptoms managed well 1:1 collaboration with primary care provider regarding development and update of comprehensive plan of care as evidenced by provider attestation and co-signature Inter-disciplinary care team collaboration (see longitudinal plan of care) Evaluation of current treatment plan related to  self management and patient's adherence to plan as established by provider Mental Health:  (Status: Goal on Track (progressing): YES.) Evaluation of current treatment plan related to Grief Active listening / Reflection utilized  Emotional Support Provided Task & activities to accomplish goals: Continue with compliance of taking medication  Call Doctor for any ongoing needs Utilize healthy coping skills discussed  Follow Up Plan: No follow up required      Christa See, MSW, [email or web address] Phone [phone] 9:38 AM

## 2021-09-14 NOTE — Patient Instructions (Addendum)
Visit Information  Thank you for taking time to visit with me today. Please don't hesitate to contact me if I can be of assistance to you before our next scheduled telephone appointment.  Following are the goals we discussed today:  Patient Self Care Activities:  Attend all scheduled provider appointments Call provider office for new concerns or questions Continue to utilize healthy coping skills to assist in management of symptoms  No follow up required  If you are experiencing a Mental Health or [place] or need someone to talk to, please call 911   The patient verbalized understanding of instructions, educational materials, and care plan provided today and DECLINED offer to receive copy of patient instructions, educational materials, and care plan.   Christa See, MSW, [email or web address] Phone [phone] 9:39 AM

## 2021-09-15 DIAGNOSIS — E1169 Type 2 diabetes mellitus with other specified complication: Secondary | ICD-10-CM | POA: Diagnosis not present

## 2021-09-15 DIAGNOSIS — E785 Hyperlipidemia, unspecified: Secondary | ICD-10-CM | POA: Diagnosis not present

## 2021-09-15 DIAGNOSIS — Z7984 Long term (current) use of oral hypoglycemic drugs: Secondary | ICD-10-CM

## 2021-09-27 DIAGNOSIS — N182 Chronic kidney disease, stage 2 (mild): Secondary | ICD-10-CM | POA: Diagnosis not present

## 2021-09-27 DIAGNOSIS — R809 Proteinuria, unspecified: Secondary | ICD-10-CM | POA: Diagnosis not present

## 2021-09-27 DIAGNOSIS — I10 Essential (primary) hypertension: Secondary | ICD-10-CM | POA: Diagnosis not present

## 2021-09-27 DIAGNOSIS — E1129 Type 2 diabetes mellitus with other diabetic kidney complication: Secondary | ICD-10-CM | POA: Diagnosis not present

## 2021-10-03 LAB — MISC LABCORP TEST (SEND OUT)
LabCorp test name: 8474
Labcorp test code: 8474

## 2021-10-04 ENCOUNTER — Encounter (INDEPENDENT_AMBULATORY_CARE_PROVIDER_SITE_OTHER): Payer: Medicare Other

## 2021-10-04 ENCOUNTER — Ambulatory Visit (INDEPENDENT_AMBULATORY_CARE_PROVIDER_SITE_OTHER): Payer: Medicare Other | Admitting: Nurse Practitioner

## 2021-10-04 DIAGNOSIS — H16001 Unspecified corneal ulcer, right eye: Secondary | ICD-10-CM | POA: Diagnosis not present

## 2021-10-07 LAB — CULTURE, FUNGUS WITHOUT SMEAR

## 2021-10-16 ENCOUNTER — Ambulatory Visit (INDEPENDENT_AMBULATORY_CARE_PROVIDER_SITE_OTHER): Payer: Medicare Other

## 2021-10-16 DIAGNOSIS — R809 Proteinuria, unspecified: Secondary | ICD-10-CM | POA: Diagnosis not present

## 2021-10-16 DIAGNOSIS — E785 Hyperlipidemia, unspecified: Secondary | ICD-10-CM | POA: Diagnosis not present

## 2021-10-16 DIAGNOSIS — E1129 Type 2 diabetes mellitus with other diabetic kidney complication: Secondary | ICD-10-CM | POA: Diagnosis not present

## 2021-10-16 DIAGNOSIS — E1169 Type 2 diabetes mellitus with other specified complication: Secondary | ICD-10-CM

## 2021-10-16 DIAGNOSIS — E1165 Type 2 diabetes mellitus with hyperglycemia: Secondary | ICD-10-CM

## 2021-10-16 NOTE — Patient Instructions (Signed)
Visit Information   Goals Addressed   None    Patient Care Plan: General Social Work (Adult)     Problem Identified: Coping Skills (General Plan of Care)      Long-Range Goal: Coping Skills Enhanced Completed 09/11/2021  Start Date: 05/16/2020  This Visit's Progress: On track  Recent Progress: On track  Priority: Medium  Note:   Current Barriers:  Financial constraints Limited social support Level of care concerns ADL IADL limitations Literacy concerns Cognitive Deficits Lacks knowledge of community resource: available financial support resources within the area as well as socialization opportunities within the area Grief support related to the recent loss of his mother Clinical Social Work Clinical Goal(s):  Over the next 120 days, client will work with SW to address concerns related to improving self-care and implementing it whenever able Interventions: Patient and spouse reports increase in stress after pt caught a virus after having an eye surgery. They are waiting for biopsy results.  Family are "taking things one day at a time" Stress management strategies discussed Patient and spouse receives strong support from family to assist them with paying rent online Grief symptoms managed well 1:1 collaboration with primary care provider regarding development and update of comprehensive plan of care as evidenced by provider attestation and co-signature Inter-disciplinary care team collaboration (see longitudinal plan of care) Evaluation of current treatment plan related to  self management and patient's adherence to plan as established by provider Mental Health:  (Status: Goal on Track (progressing): YES.) Evaluation of current treatment plan related to Grief Active listening / Reflection utilized  Emotional Support Provided Task & activities to accomplish goals: Continue with compliance of taking medication  Call Doctor for any ongoing needs Utilize healthy coping skills  discussed    Patient Care Plan: RNCM: Diabetes Type 2 (Adult)  Completed 01/03/2021   Problem Identified: RNCM: Glycemic Management (Diabetes, Type 2) Resolved 01/03/2021  Priority: High     Long-Range Goal: RNCM: Glycemic Management Optimized Completed 01/03/2021  Start Date: 04/13/2020  Expected End Date: 10/19/2021  Recent Progress: On track  Priority: High  Note:   Objective: resolving due to duplicate goal Lab Results  Component Value Date   HGBA1C 9.9 (H) 09/21/2020    Lab Results  Component Value Date   CREATININE 1.03 06/01/2020   CREATININE 1.26 05/23/2020   CREATININE 1.21 02/24/2020    No results found for: EGFR Current Barriers:  Knowledge Deficits related to basic Diabetes pathophysiology and self care/management Knowledge Deficits related to medications used for management of diabetes Financial Constraints Literacy barriers Cognitive Deficits Does not use cbg meter  Limited Social Support Unable to independently manage DM Does not adhere to provider recommendations re: Heart healthy/ADA Does not adhere to prescribed medication regimen Lacks social connections Does not contact provider office for questions/concerns Case Manager Clinical Goal(s):  Collaboration with [name], NP regarding development and update of comprehensive plan of care as evidenced by provider attestation and co-signature Inter-disciplinary care team collaboration (see longitudinal plan of care) patient will demonstrate improved adherence to prescribed treatment plan for diabetes self care/management as evidenced by:  daily monitoring and recording of ERX:[id number] to ADA/ carb modified diet. exercise 4/5 days/week adherence to prescribed medication regimen- 10-24-2020: States compliance with medications regimen.  Interventions:  Provided education to patient about basic DM disease process Reviewed medications with patient and discussed importance of medication  adherence Discussed plans with patient for ongoing care management follow up and provided patient with direct contact information for  care management team Provided patient with written educational materials related to hypo and hyperglycemia and importance of correct treatment. 10-24-2020: Review and the patient denies any lows at this time. Will continue to monitor and educate  Advised patient, providing education and rationale, to check cbg BID and record, calling pcp for findings outside established parameters.   10-24-2020: Review of the patients readings. The patients wife reports he is doing better but still having high readings at times. Range 100 to 300 per wife today Review of patient status, including review of consultants reports, relevant laboratory and other test results, and medications completed. Review of dietary restrictions: 10-24-2020: Review of heart healthy/ADA diet. The patient is drinking water and using flavor packs to flavor his water. He also is trying to eat healthier snacks.  Patient Goals/Self-Care Activities Over the next 120 days, patient will:  - UNABLE to independently manage DM Self administers oral medications as prescribed Attends all scheduled provider appointments Checks blood sugars as prescribed and utilize hyper and hypoglycemia protocol as needed Adheres to prescribed ADA/carb modified - barriers to adherence to treatment plan identified - blood glucose monitoring encouraged - blood glucose readings reviewed - individualized medical nutrition therapy provided - mutual A1C goal set or reviewed - resources required to improve adherence to care identified - self-awareness of signs/symptoms of hypo or hyperglycemia encouraged - use of blood glucose monitoring log promoted Follow Up Plan: Telephone follow up appointment with care management team member scheduled for: 01-03-2021 at 1:45pm    Task: RNCM: Alleviate Barriers to Glycemic Management Completed 10/24/2020   Outcome: Positive  Note:   Care Management Activities:    - barriers to adherence to treatment plan identified - blood glucose monitoring encouraged - blood glucose readings reviewed - individualized medical nutrition therapy provided - mutual A1C goal set or reviewed - resources required to improve adherence to care identified - self-awareness of signs/symptoms of hypo or hyperglycemia encouraged - use of blood glucose monitoring log promoted        Patient Care Plan: RNCM: Hypertension (Adult)  Completed 01/03/2021   Problem Identified: RNCM: Hypertension (Hypertension) Resolved 01/03/2021  Priority: Medium     Long-Range Goal: RNCM: Hypertension Monitored Completed 01/03/2021  Start Date: 04/13/2020  Expected End Date: 10/19/2021  Recent Progress: On track  Priority: Medium  Note:   Objective: Resolving due to duplicate goal Last practice recorded BP readings:  BP Readings from Last 3 Encounters:  09/21/20 106/64  08/10/20 126/62  08/03/20 128/74    Most recent eGFR/CrCl: No results found for: EGFR  No components found for: CRCL Current Barriers:  Knowledge Deficits related to basic understanding of hypertension pathophysiology and self care management Knowledge Deficits related to understanding of medications prescribed for management of hypertension Non-adherence to prescribed medication regimen Literacy barriers Cognitive Deficits Limited Social Support Unable to independently manage HTN Unable to self administer medications as prescribed Does not adhere to provider recommendations re: refraining from salt use in diet Lacks social connections Does not contact provider office for questions/concerns Case Manager Clinical Goal(s):  patient will verbalize understanding of plan for hypertension management patient will attend all scheduled medical appointments: several upcoming appointments with specialist   patient will demonstrate improved adherence to prescribed  treatment plan for hypertension as evidenced by taking all medications as prescribed, monitoring and recording blood pressure as directed, adhering to low sodium/DASH diet  patient will demonstrate improved health management independence as evidenced by checking blood pressure as directed and notifying PCP if SBP>160 or  DBP > 90, taking all medications as prescribe, and adhering to a low sodium diet as discussed. Interventions:  Collaboration with [name], NP regarding development and update of comprehensive plan of care as evidenced by provider attestation and co-signature Inter-disciplinary care team collaboration (see longitudinal plan of care) Evaluation of current treatment plan related to hypertension self management and patient's adherence to plan as established by provider.10-24-2020: Per the patients wife the patient has been making healthier choices and not using as much salt. He also is not eating bags of potato chips. She has him eating vegetarian chips without salt. Praised for [employer]. Will continue to monitor.  Provided education to patient re: stroke prevention, s/s of heart attack and stroke, DASH diet, complications of uncontrolled blood pressure. 10-24-2020: Per wife they have not been able to follow up with the cardiologist for an ECHO. The patients wife has had issues with her own health. The patients wife states he also is going to have to have a procedure on his knee because he has an eight cm cyst on it. Will continue to monitor.  Reviewed medications with patient and discussed importance of compliance. 10-24-2020: States compliance to medications.  Discussed plans with patient for ongoing care management follow up and provided patient with direct contact information for care management team Advised patient, providing education and rationale, to monitor blood pressure daily and record, calling PCP for findings outside established parameters.  Patient  Goals/Self-Care Activities  patient will:  - UNABLE to independently manage HTN Self administers medications as prescribed Attends all scheduled provider appointments Calls provider office for new concerns, questions, or BP outside discussed parameters Checks BP and records as discussed Follows a low sodium diet/DASH diet - blood pressure trends reviewed - depression screen reviewed - home or ambulatory blood pressure monitoring encouraged Follow Up Plan: Telephone follow up appointment with care management team member scheduled for: 01-03-2021 at 1:45 pm    Task: RNCM: Identify and Monitor Blood Pressure Elevation Completed 10/24/2020  Outcome: Positive  Note:   Care Management Activities:    - blood pressure trends reviewed - depression screen reviewed - home or ambulatory blood pressure monitoring encouraged        Patient Care Plan: CCM Pharmacy Care Plan     Problem Identified: Diabetes, HTN,HLD, Asthma, GERD, NAFLD,   Priority: High     Long-Range Goal: Disease Management   Start Date: 05/22/2021  Recent Progress: Not on track  Priority: High  Note:   Current Barriers:  Unable to maintain control of DM  Pharmacist Clinical Goal(s):  Patient will verbalize ability to afford treatment regimen achieve adherence to monitoring guidelines and medication adherence to achieve therapeutic efficacy through collaboration with PharmD and provider.   Interventions: 1:1 collaboration with [name], NP regarding development and update of comprehensive plan of care as evidenced by provider attestation and co-signature Inter-disciplinary care team collaboration (see longitudinal plan of care) Comprehensive medication review performed; medication list updated in electronic medical record  Hypertension (BP goal <130/80) BP Readings from Last 3 Encounters:  08/01/21 136/78  07/26/21 136/80  07/24/21 (!) 153/77  -Uncontrolled -Current treatment: Lisinopril 35m  Appropriate, Query effective -Medications previously tried: N/A  -Current home readings:  July 2023: 10/15/21: 118/68 10/14/21: 165/65 120/75 155/75 135/65 -Current dietary habits: "Tries to eat healthy" -Current exercise habits: none -Denies hypotensive/hypertensive symptoms -Educated on BP goals and benefits of medications for prevention of heart attack, stroke and kidney damage; -Counseled to monitor BP at home  daily, document, and provide log at future appointments July 2023: BP high, let PCP know  CAD: (LDL goal < 70) The 10-year ASCVD risk score (Arnett DK, et al., 2019) is: 11.4%   Values used to calculate the score:     Age: 61 years     Sex: Male     Is Non-Hispanic African American: No     Diabetic: Yes     Tobacco smoker: No     Systolic Blood Pressure: 175 mmHg     Is BP treated: Yes     HDL Cholesterol: 37 mg/dL     Total Cholesterol: 170 mg/dL Lab Results  Component Value Date   CHOL 170 04/26/2021   CHOL 147 01/18/2021   CHOL 207 (H) 06/01/2020   Lab Results  Component Value Date   HDL 37 (L) 04/26/2021   HDL 36 (L) 01/18/2021   HDL 27 (L) 06/01/2020   Lab Results  Component Value Date   LDLCALC 92 04/26/2021   LDLCALC 72 01/18/2021   LDLCALC 72 06/01/2020   Lab Results  Component Value Date   TRIG 240 (H) 04/26/2021   TRIG 237 (H) 01/18/2021   TRIG 694 (HH) 06/01/2020   Lab Results  Component Value Date   CHOLHDL 5.3 03/03/2019   CHOLHDL 3.9 08/04/2018   CHOLHDL 4.2 05/22/2017  No results found for: "LDLDIRECT" -Uncontrolled -CVA Hx noted - high risk. LDL goal at least <70 -Current treatment: Fenofibrate 54 mg Appropriate, query effective Atorvastatin 80 mg Appropriate, query effective Clopidogrel 27m Appropriate, Effective, Safe, Accessible ASA 887mAppropriate, Effective, Safe, Accessible -reviewed for intolerance/side effects - no problems noted    -Educated on Cholesterol goals;  Benefits of statin for ASCVD risk  reduction; -Counseled on diet and exercise extensively July 2023: Recommend Ezetimibe for LDL.   Diabetes (A1c goal <8%) Lab Results  Component Value Date   HGBA1C 8.8 (H) 07/26/2021   HGBA1C 10.2 (H) 04/26/2021   HGBA1C 8.7 (H) 01/18/2021   Lab Results  Component Value Date   MICROALBUR 10 04/26/2021   LDLCALC 92 04/26/2021   CREATININE 1.02 07/26/2021   Lab Results  Component Value Date   NA 138 07/26/2021   K 4.2 07/26/2021   CREATININE 1.02 07/26/2021   EGFR 85 07/26/2021   GFRNONAA 66 02/24/2020   GLUCOSE 322 (H) 07/26/2021   Lab Results  Component Value Date   WBC 4.8 04/26/2021   HGB 14.6 04/26/2021   HCT 43.9 04/26/2021   MCV 92 04/26/2021   PLT 174 04/26/2021  -Uncontrolled -Some issues with payment plans at current pharmacy  -NAFLD, persistent TG elevation -Current medications: Tresiba 40 units BID Appropriate, query effective Humalog 8 units before meals  Appropriate, query effective Jardiance 25 mg once daily Appropriate, query effective -Medications previously tried: stopped metformin after GFR decrease, glipizide (sulfa allergy) -Current home glucose readings June 2023 update: patient was intending to cut down on sweets, doing better with sodas.  Watching bread intake, salt  BG readings (random times) - 162, 153, 247, 184, 150 July 2023: 10/10/21: 117, 125, 125 10/11/21: 15102,585/27/23: 235, 180 10/13/21: 220, 175, 185, 165 10/14/21: 158, 178, 167 10/15/21: 193, 169 -Denies hypoglycemic/hyperglycemic symptoms -Educated on A1c and blood sugar goals; Prevention and management of hypoglycemic episodes; Benefits of routine self-monitoring of blood sugar; -Recommended to continue current medication Receives extra help on Rx copays - no current issues  July 2023: Patient over-basalized (80 kg means MDD Long acting is 40 units). Will ask PCP. Metformin  was stopped by Dr. Juleen China per patient but I can't find a contraindication. Patient's GFR  is fine for  Metformin. Will ask PCP about low dose since he needs extra therapy to bring him down  Follow Up Plan: CPP F/U October 2023 Medication Assistance: None required.  Patient affirms current coverage meets needs.  [place] Constable, Pharm.D. - [phone]     Patient Care Plan: RNCM; General Plan of Care (Adult) for Chronic Disease Management and Care Coordination Needs     Problem Identified: RNCM: Development of Care Plan for Chronic Disease Management and Care Coordination Needs (DM, HTN, HLD, Eye sty/ulcer/cataract   Priority: High     Long-Range Goal: RNCM: Development of Care Plan for Chronic Disease Management and Care Coordination Needs (DM, HTN, HLD, Eye sty/ulcer/cataract   Start Date: 01/03/2021  Expected End Date: 01/03/2022  Priority: High  Note:   Current Barriers:  Knowledge Deficits related to plan of care for management of HTN, HLD, DMII, and eye sty/ulcer   Care Coordination needs related to Financial constraints related to limited income, Limited social support, Transportation, and Level of care concerns  Chronic Disease Management support and education needs related to HTN, HLD, DMII, and eye sty/ulcer- right 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  attend all scheduled medical appointments: with pcp and specialist on a regular basis demonstrate improved and ongoing adherence to prescribed treatment plan for HTN, HLD, DMII, and eye  sty/ulcer as evidenced by daily monitoring and recording of CBG  adherence to ADA/ carb modified diet adherence to prescribed medication regimen contacting provider for new or worsened symptoms or questions  demonstrate improved and ongoing health management independence for chronic conditions as evidence of effective management and no acute exacerbations continue to work with Consulting civil engineer and/or Social Worker to address care management and care coordination needs related to HTN, HLD, DMII, and eye sty/ulcer  demonstrate a decrease in HTN, HLD, DMII, and eye styl/ulcer exacerbations  demonstrate ongoing self health care management ability  through collaboration with RN Care manager, provider, and care team.   Interventions: 1:1 collaboration with primary care provider regarding development and update of comprehensive plan of care as evidenced by provider attestation and co-signature Inter-disciplinary care team collaboration (see longitudinal plan of care) Evaluation of current treatment plan related to  self management and patient's adherence to plan as established by provider   SDOH Barriers (Status: Goal on track: YES.)  Patient interviewed and SDOH assessment performed        SDOH Interventions    Flowsheet Row Most Recent Value  SDOH Interventions   Food Insecurity Interventions Intervention Not Indicated  Financial Strain Interventions Other (Comment)  [continued support from care guides and resources]  Housing Interventions Intervention Not Indicated  Intimate Partner Violence Interventions Intervention Not Indicated  Physical Activity Interventions Other (Comments)  [is active but no structured activity]  Stress Interventions Intervention Not Indicated  Social Connections Interventions Intervention Not Indicated, Other (Comment)  [good support system]  Transportation Interventions [employer]     Patient  interviewed and appropriate assessments  performed Provided patient with information about resources available. The patient and his wife have worked with care guides before and know the process for getting help in the community for expressed needs Advised patient to call the office for changes in [place], questions or concerns, the patient states they are getting food stamps now and are thankful for the food stamps Provided education to patient/caregiver regarding level of care options.    Diabetes:  (Status: Goal on track: YES.) Lab Results  Component Value Date   HGBA1C 8.8 (H) 07/26/2021  Previous >10. 01-18-2021 8.7 Assessed patient's understanding of A1c goal: <7%. 09-13-2021: Review with the patient and the patients wife the goal of A1C.  Provided education to patient about basic DM disease process. 09-13-2021: Review with the patients wife and patient about effective management of DM and the need to have stable blood sugars.; Reviewed medications with patient and discussed importance of medication adherence. 09-13-2021: The patient states that he is taking his medications as ordered        Reviewed prescribed diet with patient heart healthy/ADA diet. 06-14-2021: The patient is doing better with dietary restrictions and denies any issues with compliance. Review of dietary restrictions. The patients wife states that he is having better readings now. 09-13-2021: The patient is doing better per his wife. He is staying away from sodas and drinking more water. He is eating vegetables from the garden and doing much better with this.  He is still wanting to eat regular salt and his wife is working with him on this.  Counseled on importance of regular laboratory monitoring as prescribed. 09-13-2021: The patient has regular lab work         Discussed plans with patient for ongoing care management follow up and provided patient with direct contact information for care management team;      Provided patient with written educational materials related to  hypo and hyperglycemia and importance of correct treatment. 09-13-2021: The patient is having higher than normal readings due to a virus in his eye. Reading this am is 265.   Reviewed scheduled/upcoming provider appointments including: 10-27-2021 at 340 pm;         Advised patient, providing education and rationale, to check cbg as directed and record. 01-03-2021: The patient checked his blood sugar while on the phone with the [facility] and it was 149. The patient states he has been in the 100's range and hardly ever over 200. Praised the patient for positive changes. Will have a new A1C coming up for follow up with the pcp on 01-18-2021. Denies any acute findings. States he is eating healthier and staying away from sodas currently.   02-22-2021: Alc trending down, In office in November the A1C was 8.7 down from 9.9.  The patients blood sugar this am was 165. The patient and patients wife encouraged to keep regular checks on blood sugars and review of goal of <130 fasting and <180 post prandial. 04-12-2021: Is checking more frequently. States he is doing better with checking his blood sugars. Review of goals.    06-14-2021: See chart below for most recent blood sugar readings:   Blood Sugars Date AM Noon PM   06/08/2021 133  178   06/09/2021 186 162 153   06/10/2021 198 181 187   06/13/2021 158 159 196   06/14/2021 188 198   09-13-2021: The patient is having ups and downs with his blood sugars. Range is 150 to 265. Education and support  given.  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 04-12-2021: The patient states that he is seeing better and they have put sensors in his eyes and he can see better. He is following with specialist and will be likely having cataract surgery. 06-14-2021: The patient is doing better and having cataract surgery on his right eye on 07-10-2021. He had cataract surgery on his left eye on 06-05-2021 and did well. Has 20/20 vision in that eye now. Goes tomorrow for reevaluation due to the eye conditions he has been dealing with. 09-13-2021: The patient has a virus in his left eye currently and is being treated for this. He goes tomorrow for follow up with the eye doctor. He is having to put drops in his eye. They are hoping that they can save his vision. Education and support given. Will continue to monitor. 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 States that they were on back order. He can see some today out of his right eye, education and support given. 04-12-2021: The patient is seeing the specialist weekly and is taking medications as prescribed. 09-13-2021: The patient is compliant with medications.  ; Reviewed scheduled/upcoming provider appointments including appointment with eye doctor on 09-14-2021 Discussed plans with patient for ongoing care management follow up and provided patient with direct contact information for care management  team;  Hyperlipidemia:  (Status: Goal on Track (progressing): YES.) Lab Results  Component Value Date   CHOL 170 04/26/2021   HDL 37 (L) 04/26/2021   LDLCALC 92 04/26/2021   TRIG 240 (H) 04/26/2021   CHOLHDL 5.3 03/03/2019     Medication review performed; medication list updated in electronic medical record. Atorvastatin 80 mg QD Provider established cholesterol goals reviewed. 09-13-2021: Education and support given; Counseled on importance of regular laboratory monitoring as prescribed; Provided HLD educational materials; Reviewed role and benefits of statin for ASCVD risk reduction; Discussed strategies to manage statin-induced myalgias; Reviewed importance of limiting foods high in cholesterol. 09-13-2021: Education provided on Heart Healthy/ADA diet. Extensive review and education given.  Reviewed exercise goals and target of 150 minutes per week;  Hypertension: (Status: Goal on track: YES.) Last practice recorded BP readings:  BP Readings from Last 3 Encounters:  08/01/21 136/78  07/26/21 136/80  07/24/21 (!) 153/77  Most recent eGFR/CrCl:  Lab Results  Component Value Date   EGFR 85 07/26/2021    No components found for: CRCL  Evaluation of current treatment plan related to hypertension self management and patient's adherence to plan as established by provider. 04-12-2021: The patient is doing well and has no question related to heart health. Will reschedule follow up with specialist to have an ECHO. Missed appointment due to having issues with his eye. Denies any acute findings. 06-14-2021: The patient is seeing the heart specialist. The patient is doing well and maintaining his health and well being. 09-13-2021: The patient is doing well with heart health. Sees specialist on a regular basis. HTN stable.;   Provided education to patient re: stroke prevention, s/s of heart attack and stroke; Reviewed prescribed diet heart healthy/ADA. 09-13-2021: Reviewed. Per the patients wife he  is still using a lot of salt in his diet. The patients wife is trying to get him to cut back on his consumption of salt. Education and support given. Reviewed medications with patient and discussed importance of compliance. 09-13-2021: States compliance with medications, wife assist with medications management. Per the patient he is supposed to stop his blood thinners. Discussed with the wife the need to talk to the specialist and cardiologist before stopping blood thinners due to past history of stroke. Advised that usually when a patient has a procedure they sometimes need to stop certain medications for the procedure. Extensive review on making sure they have a clear understanding before stopping the ASA or plavix. Teach back method used to make sure the patients wife understood instructions given. Ask the patient wife to write down questions to ask when they call to schedule the injections for the patient to have in his right hip.  Counseled on adverse effects of illicit drug and excessive alcohol use in patients with high blood pressure;  Counseled on the importance of exercise goals with target of 150 minutes per week Discussed plans with patient for ongoing care management follow up and provided patient with direct contact information for care management team; Advised patient, providing education and rationale, to monitor blood pressure daily and record, calling PCP for findings outside established parameters;  Reviewed scheduled/upcoming provider appointments including:10-27-2021 at 340 pm   Pain:  (Status: New goal.) Long Term Goal added on 09-13-2021: The patient is having right hip pain- new onset Pain assessment performed. 09-13-2021: The patient is complaining of right hip pain rating the pain at a 10 today on a scale of 0-10 Medications reviewed. 09-13-2021: The patient is waiting to hear from general surgery about a procedure they are going to do to help his hip pain. Extensive review with the  patient about following the instructions from the provider Reviewed provider established plan for pain management. 09-13-2021: Per the patients wife they are going to try a procedure to see if that will help with the pain and discomfort the patient is experiencing in his right hip. If that does not work they will likely do surgery; Discussed importance of adherence to all scheduled medical appointments. Sees pcp on 10-27-2021 will have procedure on hip when office calls to set this up Counseled on the importance of reporting any/all new or changed pain symptoms or management strategies to pain management provider; Advised patient to report to care team affect of pain on daily activities; Discussed use of relaxation techniques and/or diversional activities to assist with pain reduction (distraction, imagery, relaxation, massage, acupressure, TENS, heat, and cold application; Reviewed with patient prescribed pharmacological and nonpharmacological pain relief strategies; Advised patient to discuss uncontrolled pain, changes in level or intensity of pain with provider; Screening for  signs and symptoms of depression related to chronic disease state;  Assessed social determinant of health barriers;    Patient Goals/Self-Care Activities: Patient will self administer medications as prescribed as evidenced by self report/primary caregiver report  Patient will attend all scheduled provider appointments as evidenced by clinician review of documented attendance to scheduled appointments and patient/caregiver report Patient will call pharmacy for medication refills as evidenced by patient report and review of pharmacy fill history as appropriate Patient will attend church or other social activities as evidenced by patient report Patient will continue to perform ADL's independently as evidenced by patient/caregiver report Patient will continue to perform IADL's independently as evidenced by patient/caregiver  report Patient will call provider office for new concerns or questions as evidenced by review of documented incoming telephone call notes and patient report Patient will work with BSW to address care coordination needs and will continue to work with the clinical team to address health care and disease management related needs as evidenced by documented adherence to scheduled care management/care coordination appointments - keep appointment with eye doctor - check blood sugar at prescribed times: before meals and at bedtime, when you have symptoms of low or high blood sugar, and before and after exercise - check feet daily for cuts, sores or redness - enter blood sugar readings and medication or insulin into daily log - take the blood sugar log to all doctor visits - trim toenails straight across - drink 6 to 8 glasses of water each day - eat fish at least once per week - fill half of plate with vegetables - limit fast food meals to no more than 1 per week - manage portion size - prepare main meal at home 3 to 5 days each week - read food labels for fat, fiber, carbohydrates and portion size - reduce red meat to 2 to 3 times a week - keep feet up while sitting - wash and dry feet carefully every day - wear comfortable, cotton socks - wear comfortable, well-fitting shoes - check blood pressure weekly - choose a place to take my blood pressure (home, clinic or office, retail store) - write blood pressure results in a log or diary - learn about high blood pressure - keep a blood pressure log - take blood pressure log to all doctor appointments - call doctor for signs and symptoms of high blood pressure - keep all doctor appointments - take medications for blood pressure exactly as prescribed - report new symptoms to your doctor - eat more whole grains, fruits and vegetables, lean meats and healthy fats - call for medicine refill 2 or 3 days before it runs out - take all medications  exactly as prescribed - call doctor with any symptoms you believe are related to your medicine - call doctor when you experience any new symptoms - go to all doctor appointments as scheduled - adhere to prescribed diet: heart healthy/ADA diet        Mr. Giovanetti was given information about Chronic Care Management services today including:  CCM service includes personalized support from designated clinical staff supervised by his physician, including individualized plan of care and coordination with other care providers 24/7 contact phone numbers for assistance for urgent and routine care needs. Standard insurance, coinsurance, copays and deductibles apply for chronic care management only during months in which we provide at least 20 minutes of these services. Most insurances cover these services at 100%, however patients may be responsible for any copay, coinsurance and/or  deductible if applicable. This service may help you avoid the need for more expensive face-to-face services. Only one practitioner may furnish and bill the service in a calendar month. The patient may stop CCM services at any time (effective at the end of the month) by phone call to the office staff.  Patient agreed to services and verbal consent obtained.   The patient verbalized understanding of instructions, educational materials, and care plan provided today and DECLINED offer to receive copy of patient instructions, educational materials, and care plan.  The pharmacy team will reach out to the patient again over the next 60 days.   Lane Hacker, [place]

## 2021-10-16 NOTE — Progress Notes (Signed)
Chronic Care Management Pharmacy Note  10/16/2021 Name:  [name]. MRN:  [id number] DOB:  05-Apr-1960  Summary: -Very pleasant patient and his wife ([name]) who manages all his healthcare. She loves animals and even saved an animal while visiting the zoo one time!  Recommendation to PCP: -Cholesterol high. Could we try Ezetimibe or a PSK9(-)? -Sugars elevated. Patient over-basalized (Weight 80kg and getting 80 units long term/day. MDD long term is 0.5 units/kg). -Patient was stopped on Metformin due to GFR but his GFR looks perfect. Could we try a low dose Metformin -BP elevated. Could we increase Lisinopril?  10/15/21: 118/68 10/14/21: 165/65 120/75 155/75 135/65 -Patient's rent at [place] is increasing 200% ($250->$500). Is there a Education officer, museum we could set patient up with for help with food and rent?   Subjective: [name]. is an 61 y.o. year old male who is a primary patient of Cannady, Barbaraann Faster, NP.  The CCM team was consulted for assistance with disease management and care coordination needs.    Engaged with patient by telephone for follow up visit in response to provider referral for pharmacy case management and/or care coordination services.   Consent to Services:  The patient was given information about Chronic Care Management services, agreed to services, and gave verbal consent prior to initiation of services.  Please see initial visit note for detailed documentation.   Patient Care Team: [name], NP as PCP - General (Nurse Practitioner) Anell Barr, OD (Optometry) Dionisio David, MD as Consulting Physician (Cardiology) Vanita Ingles, RN as Case Manager ([place]) Madelin Rear, [facility] (Pharmacist) Lane Hacker, [facility] (Pharmacist)  Hospital visits: None in previous 6 months  Objective:  Lab Results  Component Value Date   CREATININE 1.02 07/26/2021   CREATININE 1.44 (H) 04/26/2021   CREATININE 1.25  01/18/2021    Lab Results  Component Value Date   HGBA1C 8.8 (H) 07/26/2021   Last diabetic Eye exam:  Lab Results  Component Value Date/Time   HMDIABEYEEXA No Retinopathy 05/25/2021 12:00 AM    Last diabetic Foot exam: No results found for: "HMDIABFOOTEX"      Component Value Date/Time   CHOL 170 04/26/2021 1425   CHOL 136 12/02/2018 1408   TRIG 240 (H) 04/26/2021 1425   TRIG 145 12/02/2018 1408   HDL 37 (L) 04/26/2021 1425   CHOLHDL 5.3 03/03/2019 0447   VLDL 44 (H) 03/03/2019 0447   VLDL 229 (H) 12/02/2018 1408   LDLCALC 92 04/26/2021 1425       Latest Ref Rng & Units 04/26/2021    2:25 PM 01/18/2021    2:31 PM 06/01/2020    3:16 PM  Hepatic Function  Total Protein 6.0 - 8.5 g/dL 7.1  6.6  6.8   Albumin 3.8 - 4.9 g/dL 4.8  4.4  4.5   AST 0 - 40 IU/L _0 ALT 0 - 44 IU/L 36  27  29   Alk Phosphatase 44 - 121 IU/L 148  144  187   Total Bilirubin 0.0 - 1.2 mg/dL 0.5  0.3  0.3     Lab Results  Component Value Date/Time   TSH 1.130 04/26/2021 02:25 PM   TSH 0.881 06/01/2020 03:16 PM       Latest Ref Rng & Units 04/26/2021    2:25 PM 01/18/2021    2:31 PM 05/23/2020    4:15 PM  CBC  WBC 3.4 -  10.8 x10E3/uL 4.8  5.0  5.6   Hemoglobin 13.0 - 17.7 g/dL 14.6  13.4  13.7   Hematocrit 37.5 - 51.0 % 43.9  42.6  42.6   Platelets 150 - 450 x10E3/uL 174  176  196     Lab Results  Component Value Date/Time   VD25OH 22.5 (L) 04/26/2021 02:25 PM   VD25OH 37.7 06/28/2020 09:16 AM    Clinical ASCVD:  The 10-year ASCVD risk score (Arnett DK, et al., 2019) is: 11.4%   Values used to calculate the score:     Age: 40 years     Sex: Male     Is Non-Hispanic African American: No     Diabetic: Yes     Tobacco smoker: No     Systolic Blood Pressure: 867 mmHg     Is BP treated: Yes     HDL Cholesterol: 37 mg/dL     Total Cholesterol: 170 mg/dL     Social History   Tobacco Use  Smoking Status Never  Smokeless Tobacco Never   BP Readings from Last 3 Encounters:   08/01/21 136/78  07/26/21 136/80  07/24/21 (!) 153/77   Pulse Readings from Last 3 Encounters:  08/01/21 (!) 51  07/26/21 (!) 56  07/24/21 (!) 54   Wt Readings from Last 3 Encounters:  08/01/21 176 lb (79.8 kg)  07/26/21 178 lb 3.2 oz (80.8 kg)  07/10/21 177 lb (80.3 kg)    Assessment: Review of patient past medical history, allergies, medications, health status, including review of consultants reports, laboratory and other test data, was performed as part of comprehensive evaluation and provision of chronic care management services.   SDOH:  (Social Determinants of Health) assessments and interventions performed: Yes SDOH Interventions    Flowsheet Row Most Recent Value  SDOH Interventions   Financial Strain Interventions Other (Comment)  [let Education officer, museum know]  Transportation Interventions Intervention Not Indicated      Care Gaps - none noted Start Drugs - no gaps noted w/ statin, on insulin   CCM Care Plan  Allergies  Allergen Reactions   Bee Venom Hives    All kinds of bees   Other     Certain powders   Sulfa Antibiotics     Medications Reviewed Today     Reviewed by Lane Hacker, [facility] (Pharmacist) on 10/16/21 at 22  Med List Status: <None>   Medication Order Taking? Sig Documenting Provider Last Dose Status Informant  aspirin 81 MG EC tablet [id number] No Take 1 tablet (81 mg total) by mouth daily. Marnee Guarneri T, NP Taking Active   atorvastatin (LIPITOR) 80 MG tablet [id number] No TAKE 1 TABLET BY MOUTH ONCE DAILY Cannady, Jolene T, NP Taking Active   Blood Glucose Monitoring Suppl (ONE TOUCH ULTRA 2) w/Device KIT [id number] No Use to check blood sugar 4 times a day Cannady, Jolene T, NP Taking Active   BREO ELLIPTA 100-25 MCG/ACT AEPB [id number] No INHALE 1 PUFF BY MOUTH ONCE DAILY Cannady, Jolene T, NP Taking Active   Cholecalciferol 1.25 MG (50000 UT) TABS [id number] No Take 1 tablet by mouth once a week. Marnee Guarneri T, NP Taking Active    clopidogrel (PLAVIX) 75 MG tablet [id number] No Take 1 tablet by mouth daily. [provider] Taking Active   EPINEPHRINE 0.3 mg/0.3 mL IJ SOAJ injection [id number] No INJECT 1 SYRINGE INTO OUTER THIGH ONCE AS NEEDED FOR SEVERE ALLERGIC REACTION. Marnee Guarneri T, NP Taking Active   fenofibrate 54 MG tablet  [id number]  TAKE 1 TABLET BY MOUTH ONCE DAILY Cannady, Jolene T, NP  Active   gabapentin (NEURONTIN) 300 MG capsule [id number]  TAKE 1 CAPSULE BY MOUTH TWICE DAILY Cannady, Jolene T, NP  Active   glucose blood (ONETOUCH ULTRA) test strip [id number] No 1 each by Other route 5 (five) times daily. Marnee Guarneri T, NP Taking Active   insulin degludec (TRESIBA FLEXTOUCH) 100 UNIT/ML FlexTouch Pen [id number] No Take 45 units into the skin in morning and 45 units into skin every evening. Marnee Guarneri T, NP Taking Active   insulin lispro (HUMALOG KWIKPEN) 100 UNIT/ML KwikPen [id number] No Inject 8 Units into the skin 3 (three) times daily. Prior to breakfast, lunch, and dinner. Marnee Guarneri T, NP Taking Active   Insulin Pen Needle (PEN NEEDLES) 32G X 4 MM MISC [id number] No 1 kit by Does not apply route daily. Marnee Guarneri T, NP Taking Active   JARDIANCE 25 MG TABS tablet [id number]  TAKE 1 TABLET BY MOUTH ONCE DAILY Ned Card, Barbaraann Faster, NP  Active   Lancets [facility] DELICA PLUS [id number]) MISC [id number] No USE AS DIRECTED. Marnee Guarneri T, NP Taking Active   lisinopril (ZESTRIL) 20 MG tablet [id number]  TAKE 1 TABLET BY MOUTH ONCE DAILY Cannady, Jolene T, NP  Active   Misc. Devices (PULSE OXIMETER FOR FINGER) MISC [id number] No To check O2 saturations once daily with asthma and document + check if any shortness of breath Cannady, Jolene T, NP Taking Active   montelukast (SINGULAIR) 10 MG tablet [id number] No TAKE 1 TABLET BY MOUTH AT BEDTIME Cannady, Jolene T, NP Taking Active   nitroGLYCERIN (NITROSTAT) 0.4 MG SL tablet [id number] No Place 1 tablet (0.4 mg total) under the tongue every 5 (five)  minutes as needed for chest pain. Birdie Sons, MD Taking Active   omeprazole (PRILOSEC) 20 MG capsule [id number]  TAKE 1 CAPSULE BY MOUTH ONCE DAILY Cannady, Jolene T, NP  Active   tiZANidine (ZANAFLEX) 2 MG tablet [id number] No Take 1 tablet (2 mg total) by mouth every 6 (six) hours as needed for muscle spasms. Marnee Guarneri T, NP Taking Active   triamcinolone cream (KENALOG) 0.1 % [id number] No Apply 1 application. topically 2 (two) times daily. Marnee Guarneri T, NP Taking Active   trimethoprim-polymyxin b (POLYTRIM) ophthalmic solution [id number] No Place 1 drop into the right eye every 6 (six) hours.  [provider] Taking Active   ULTRACARE PEN NEEDLES 32G X 5 MM MISC [id number] No  [provider] Taking Active   valACYclovir (VALTREX) 1000 MG tablet [id number] No Take 1,000 mg by mouth 3 (three) times daily. [provider] Taking Active   vitamin B-12 (CYANOCOBALAMIN) 1000 MCG tablet [id number] No Take 1 tablet (1,000 mcg total) by mouth daily. [name], NP Taking Active             Patient Active Problem List   Diagnosis Date Noted   Right hip pain 05/24/2021   B12 deficiency 04/26/2021   Heart murmur 09/21/2020   Type 2 diabetes mellitus with proteinuria ([place]) 03/25/2019   Atherosclerosis of both carotid arteries 03/22/2019   History of CVA (cerebrovascular accident) 03/06/2019   Uncontrolled type 2 diabetes mellitus with hyperglycemia ([place])    Obesity 09/03/2018   Frequent falls 05/15/2016   Vitamin D deficiency 08/22/2015   Carpal tunnel syndrome on left 05/12/2015   External hemorrhoids 12/20/2014   Allergic rhinitis 10/21/2014   Diverticulosis 10/21/2014   Hypertension associated with diabetes ([place]) 10/21/2014  Hyperlipidemia associated with type 2 diabetes mellitus ([place]) 10/21/2014   Asthma 10/21/2014   GERD (gastroesophageal reflux disease) 10/21/2014   NAFLD (nonalcoholic fatty liver disease) 10/21/2014   Arthritis 10/21/2014    History of pancreatitis 10/07/2014   Phenylketonuria (PKU) ([place]) 10/07/2014   Renal cyst, left 10/07/2014   Gall bladder polyp 10/01/2013    Immunization History  Administered Date(s) Administered   Hepatitis A, Adult 09/26/2016, 05/22/2017   Hepatitis B, adult 09/26/2016, 05/22/2017, 11/29/2017   Influenza,inj,Quad PF,6+ Mos 12/22/2012, 12/21/2013, 11/29/2017, 12/02/2018, 01/18/2021   Influenza-Unspecified 11/30/2014, 11/18/2015   Moderna Sars-Covid-2 Vaccination 11/16/2019, 12/14/2019, 12/14/2019   Pneumococcal Polysaccharide-23 12/22/2012, 11/18/2015   Td 01/18/2021    Conditions to be addressed/monitored: HTN, HLD, and DMII  Care Plan : [place]  Updates made by Lane Hacker, [place] since 10/16/2021 12:00 AM     Problem: Diabetes, HTN,HLD, Asthma, GERD, NAFLD,   Priority: High     Long-Range Goal: Disease Management   Start Date: 05/22/2021  Recent Progress: Not on track  Priority: High  Note:   Current Barriers:  Unable to maintain control of DM  Pharmacist Clinical Goal(s):  Patient will verbalize ability to afford treatment regimen achieve adherence to monitoring guidelines and medication adherence to achieve therapeutic efficacy through collaboration with PharmD and provider.   Interventions: 1:1 collaboration with [name], NP regarding development and update of comprehensive plan of care as evidenced by provider attestation and co-signature Inter-disciplinary care team collaboration (see longitudinal plan of care) Comprehensive medication review performed; medication list updated in electronic medical record  Hyperlipidemia: (LDL goal < 70) The 10-year ASCVD risk score (Arnett DK, et al., 2019) is: 11.4%   Values used to calculate the score:     Age: 61 years     Sex: Male     Is Non-Hispanic African American: No     Diabetic: Yes     Tobacco smoker: No     Systolic Blood Pressure: 283 mmHg     Is BP treated: Yes     HDL  Cholesterol: 37 mg/dL     Total Cholesterol: 170 mg/dL Lab Results  Component Value Date   CHOL 170 04/26/2021   CHOL 147 01/18/2021   CHOL 207 (H) 06/01/2020   Lab Results  Component Value Date   HDL 37 (L) 04/26/2021   HDL 36 (L) 01/18/2021   HDL 27 (L) 06/01/2020   Lab Results  Component Value Date   LDLCALC 92 04/26/2021   LDLCALC 72 01/18/2021   LDLCALC 72 06/01/2020   Lab Results  Component Value Date   TRIG 240 (H) 04/26/2021   TRIG 237 (H) 01/18/2021   TRIG 694 ([place]) 06/01/2020   Lab Results  Component Value Date   CHOLHDL 5.3 03/03/2019   CHOLHDL 3.9 08/04/2018   CHOLHDL 4.2 05/22/2017  No results found for: "LDLDIRECT" -Uncontrolled -CVA Hx noted - high risk. LDL goal at least <70 -Current treatment: Fenofibrate 54 mg  Appropriate, query effective Atorvastatin 80 mg  Appropriate, query effective -reviewed for intolerance/side effects - no problems noted    -Educated on Cholesterol goals;  Benefits of statin for ASCVD risk reduction; -Counseled on diet and exercise extensively July 2023: Recommend Ezetimibe for LDL.   Diabetes (A1c goal <8%) Lab Results  Component Value Date   HGBA1C 8.8 (H) 07/26/2021   HGBA1C 10.2 (H) 04/26/2021   HGBA1C 8.7 (H) 01/18/2021   Lab Results  Component Value Date   MICROALBUR 10 04/26/2021  LDLCALC 92 04/26/2021   CREATININE 1.02 07/26/2021   Lab Results  Component Value Date   NA 138 07/26/2021   K 4.2 07/26/2021   CREATININE 1.02 07/26/2021   EGFR 85 07/26/2021   GFRNONAA 66 02/24/2020   GLUCOSE 322 (H) 07/26/2021   Lab Results  Component Value Date   WBC 4.8 04/26/2021   HGB 14.6 04/26/2021   HCT 43.9 04/26/2021   MCV 92 04/26/2021   PLT 174 04/26/2021  -Uncontrolled -Some issues with payment plans at current pharmacy  -NAFLD, persistent TG elevation -Current medications: Tresiba 40 units BID Appropriate, query effective Humalog 8 units before meals  Appropriate, query effective Jardiance 25  mg once daily Appropriate, query effective -Medications previously tried: stopped metformin after GFR decrease, glipizide (sulfa allergy) -Current home glucose readings June 2023 update: patient was intending to cut down on sweets, doing better with sodas.  Watching bread intake, salt  BG readings (random times) - 162, 153, 247, 184, 150 July 2023: 10/10/21: 117, 125, 125 10/11/21: 161,096 10/12/21: 235, 180 10/13/21: 220, 175, 185, 165 10/14/21: 158, 178, 167 10/15/21: 193, 169 -Denies hypoglycemic/hyperglycemic symptoms -Educated on A1c and blood sugar goals; Prevention and management of hypoglycemic episodes; Benefits of routine self-monitoring of blood sugar; -Recommended to continue current medication Receives extra help on Rx copays - no current issues  July 2023: Patient over-basalized (80 kg means MDD Long acting is 40 units). Will ask PCP. Metformin was stopped by Dr. Juleen China per patient but I can't find a contraindication. Patient's GFR  is fine for Metformin. Will ask PCP about low dose since he needs extra therapy to bring him down  Follow Up Plan: CPP F/U October 2023 Medication Assistance: None required.  Patient affirms current coverage meets needs.  [place] Constable, Pharm.D. - [phone]       Patient's preferred pharmacy is:  [facility], [address]. [place] [address] Phone: [phone] Fax: [phone] -They allow him to pay on credit, patient having difficulty affording meds some month  Pt endorses 100% compliance  Follow Up:  Patient agrees to Care Plan and Follow-up.  Plan:   Future Appointments  Date Time Provider [place]  10/27/2021  3:40 PM Marnee Guarneri T, NP CFP-CFP PEC  11/01/2021  1:00 PM [facility] VASC 3 AVVS-IMG None  11/01/2021  2:00 PM Kris Hartmann, NP AVVS-AVVS None  11/08/2021  1:00 PM CFP CCM CASE MANAGER CFP-CFP PEC  12/13/2021  3:00 PM Vanga, Tally Due, MD AGI-AGIB None  12/25/2021  11:00 AM CFP CCM PHARMACY CFP-CFP PEC    [place] Constable, Pharm.D. - [phone]

## 2021-10-18 DIAGNOSIS — M1611 Unilateral primary osteoarthritis, right hip: Secondary | ICD-10-CM | POA: Diagnosis not present

## 2021-10-22 NOTE — Patient Instructions (Addendum)
[name] WE ARE DOING THE FOLLOWING: - Increase pre meal insulin to 12 units, if he does not eat, do not give this. - Start Januvia 100 MG daily by mouth - Continue Tresiba - I am placing another referral to diabetes doctor -- HE NEEDS TO SEE THEM ASAP, as his A1c went up to 10% today. - We are decreasing his Lisinopril to 10 MG, stop the 20 MG when you get the 10 MG pill -- as his BP running on lower side and ensure he drinks plenty of water.   - NO SODAS.  Diabetes Mellitus and Nutrition, Adult When you have diabetes, or diabetes mellitus, it is very important to have healthy eating habits because your blood sugar (glucose) levels are greatly affected by what you eat and drink. Eating healthy foods in the right amounts, at about the same times every day, can help you: Manage your blood glucose. Lower your risk of heart disease. Improve your blood pressure. Reach or maintain a healthy weight. What can affect my meal plan? Every person with diabetes is different, and each person has different needs for a meal plan. Your health care provider may recommend that you work with a dietitian to make a meal plan that is best for you. Your meal plan may vary depending on factors such as: The calories you need. The medicines you take. Your weight. Your blood glucose, blood pressure, and cholesterol levels. Your activity level. Other health conditions you have, such as heart or kidney disease. How do carbohydrates affect me? Carbohydrates, also called carbs, affect your blood glucose level more than any other type of food. Eating carbs raises the amount of glucose in your blood. It is important to know how many carbs you can safely have in each meal. This is different for every person. Your dietitian can help you calculate how many carbs you should have at each meal and for each snack. How does alcohol affect me? Alcohol can cause a decrease in blood glucose (hypoglycemia), especially if you use insulin  or take certain diabetes medicines by mouth. Hypoglycemia can be a life-threatening condition. Symptoms of hypoglycemia, such as sleepiness, dizziness, and confusion, are similar to symptoms of having too much alcohol. Do not drink alcohol if: Your health care provider tells you not to drink. You are pregnant, may be pregnant, or are planning to become pregnant. If you drink alcohol: Limit how much you have to: 0-1 drink a day for women. 0-2 drinks a day for men. Know how much alcohol is in your drink. In the [place] one drink equals one 12 oz bottle of beer (355 mL), one 5 oz glass of wine (148 mL), or one 1 oz glass of hard liquor (44 mL). Keep yourself hydrated with water, diet soda, or unsweetened iced tea. Keep in mind that regular soda, juice, and other mixers may contain a lot of sugar and must be counted as carbs. What are tips for following this plan?  Reading food labels Start by checking the serving size on the Nutrition Facts label of packaged foods and drinks. The number of calories and the amount of carbs, fats, and other nutrients listed on the label are based on one serving of the item. Many items contain more than one serving per package. Check the total grams (g) of carbs in one serving. Check the number of grams of saturated fats and trans fats in one serving. Choose foods that have a low amount or none of these fats. Check the  number of milligrams (mg) of salt (sodium) in one serving. Most people should limit total sodium intake to less than 2,300 mg per day. Always check the nutrition information of foods labeled as "low-fat" or "nonfat." These foods may be higher in added sugar or refined carbs and should be avoided. Talk to your dietitian to identify your daily goals for nutrients listed on the label. Shopping Avoid buying canned, pre-made, or processed foods. These foods tend to be high in fat, sodium, and added sugar. Shop around the outside edge of the grocery store.  This is where you will most often find fresh fruits and vegetables, bulk grains, fresh meats, and fresh dairy products. Cooking Use low-heat cooking methods, such as baking, instead of high-heat cooking methods, such as deep frying. Cook using healthy oils, such as olive, canola, or sunflower oil. Avoid cooking with butter, cream, or high-fat meats. Meal planning Eat meals and snacks regularly, preferably at the same times every day. Avoid going long periods of time without eating. Eat foods that are high in fiber, such as fresh fruits, vegetables, beans, and whole grains. Eat 4-6 oz (112-168 g) of lean protein each day, such as lean meat, chicken, fish, eggs, or tofu. One ounce (oz) (28 g) of lean protein is equal to: 1 oz (28 g) of meat, chicken, or fish. 1 egg.  cup (62 g) of tofu. Eat some foods each day that contain healthy fats, such as avocado, nuts, seeds, and fish. What foods should I eat? Fruits Berries. Apples. Oranges. Peaches. Apricots. Plums. Grapes. Mangoes. Papayas. Pomegranates. Kiwi. Cherries. Vegetables Leafy greens, including lettuce, spinach, kale, chard, collard greens, mustard greens, and cabbage. Beets. Cauliflower. Broccoli. Carrots. Green beans. Tomatoes. Peppers. Onions. Cucumbers. Brussels sprouts. Grains Whole grains, such as whole-wheat or whole-grain bread, crackers, tortillas, cereal, and pasta. Unsweetened oatmeal. Quinoa. Brown or wild rice. Meats and other proteins Seafood. Poultry without skin. Lean cuts of poultry and beef. Tofu. Nuts. Seeds. Dairy Low-fat or fat-free dairy products such as milk, yogurt, and cheese. The items listed above may not be a complete list of foods and beverages you can eat and drink. Contact a dietitian for more information. What foods should I avoid? Fruits Fruits canned with syrup. Vegetables Canned vegetables. Frozen vegetables with butter or cream sauce. Grains Refined white flour and flour products such as bread,  pasta, snack foods, and cereals. Avoid all processed foods. Meats and other proteins Fatty cuts of meat. Poultry with skin. Breaded or fried meats. Processed meat. Avoid saturated fats. Dairy Full-fat yogurt, cheese, or milk. Beverages Sweetened drinks, such as soda or iced tea. The items listed above may not be a complete list of foods and beverages you should avoid. Contact a dietitian for more information. Questions to ask a health care provider Do I need to meet with a certified diabetes care and education specialist? Do I need to meet with a dietitian? What number can I call if I have questions? When are the best times to check my blood glucose? Where to find more information: American Diabetes Association: diabetes.org Academy of Nutrition and Dietetics: eatright.[employer] of Diabetes and Digestive and Kidney Diseases: [employer] Association of Diabetes Care & Education Specialists: diabeteseducator.org Summary It is important to have healthy eating habits because your blood sugar (glucose) levels are greatly affected by what you eat and drink. It is important to use alcohol carefully. A healthy meal plan will help you manage your blood glucose and lower your risk of heart disease. Your  health care provider may recommend that you work with a dietitian to make a meal plan that is best for you. This information is not intended to replace advice given to you by your health care provider. Make sure you discuss any questions you have with your health care provider. Document Revised: 10/07/2019 Document Reviewed: 10/07/2019 Elsevier Patient Education  [place].

## 2021-10-27 ENCOUNTER — Encounter: Payer: Self-pay | Admitting: Nurse Practitioner

## 2021-10-27 ENCOUNTER — Ambulatory Visit (INDEPENDENT_AMBULATORY_CARE_PROVIDER_SITE_OTHER): Payer: Medicare Other | Admitting: Nurse Practitioner

## 2021-10-27 VITALS — BP 106/66 | HR 64 | Ht 64.0 in | Wt 174.1 lb

## 2021-10-27 DIAGNOSIS — R809 Proteinuria, unspecified: Secondary | ICD-10-CM

## 2021-10-27 DIAGNOSIS — Z8719 Personal history of other diseases of the digestive system: Secondary | ICD-10-CM | POA: Diagnosis not present

## 2021-10-27 DIAGNOSIS — E1129 Type 2 diabetes mellitus with other diabetic kidney complication: Secondary | ICD-10-CM

## 2021-10-27 DIAGNOSIS — E1165 Type 2 diabetes mellitus with hyperglycemia: Secondary | ICD-10-CM

## 2021-10-27 DIAGNOSIS — I152 Hypertension secondary to endocrine disorders: Secondary | ICD-10-CM

## 2021-10-27 DIAGNOSIS — Z8673 Personal history of transient ischemic attack (TIA), and cerebral infarction without residual deficits: Secondary | ICD-10-CM | POA: Diagnosis not present

## 2021-10-27 DIAGNOSIS — E785 Hyperlipidemia, unspecified: Secondary | ICD-10-CM

## 2021-10-27 DIAGNOSIS — E559 Vitamin D deficiency, unspecified: Secondary | ICD-10-CM

## 2021-10-27 DIAGNOSIS — E1159 Type 2 diabetes mellitus with other circulatory complications: Secondary | ICD-10-CM

## 2021-10-27 DIAGNOSIS — Z6830 Body mass index (BMI) 30.0-30.9, adult: Secondary | ICD-10-CM

## 2021-10-27 DIAGNOSIS — R011 Cardiac murmur, unspecified: Secondary | ICD-10-CM

## 2021-10-27 DIAGNOSIS — M25551 Pain in right hip: Secondary | ICD-10-CM

## 2021-10-27 DIAGNOSIS — E6609 Other obesity due to excess calories: Secondary | ICD-10-CM

## 2021-10-27 DIAGNOSIS — E701 Other hyperphenylalaninemias: Secondary | ICD-10-CM | POA: Diagnosis not present

## 2021-10-27 DIAGNOSIS — E1169 Type 2 diabetes mellitus with other specified complication: Secondary | ICD-10-CM

## 2021-10-27 DIAGNOSIS — K219 Gastro-esophageal reflux disease without esophagitis: Secondary | ICD-10-CM | POA: Diagnosis not present

## 2021-10-27 DIAGNOSIS — K760 Fatty (change of) liver, not elsewhere classified: Secondary | ICD-10-CM

## 2021-10-27 LAB — BAYER DCA HB A1C WAIVED: HB A1C (BAYER DCA - WAIVED): 10 % — ABNORMAL HIGH (ref 4.8–5.6)

## 2021-10-27 MED ORDER — SITAGLIPTIN PHOSPHATE 100 MG PO TABS: 100.0000 mg | ORAL_TABLET | Freq: Every day | ORAL | 4 refills | Status: DC

## 2021-10-27 MED ORDER — LISINOPRIL 10 MG PO TABS: 10.0000 mg | ORAL_TABLET | Freq: Every day | ORAL | 4 refills | Status: DC

## 2021-10-27 NOTE — Assessment & Plan Note (Signed)
Chronic, ongoing.  Continue Lipitor and Fenofibrate. Lipid panel today.  Adjust doses as needed, if LDL >70.

## 2021-10-27 NOTE — Assessment & Plan Note (Signed)
Would avoid medications that could flare acute event, such as GLP1.

## 2021-10-27 NOTE — Assessment & Plan Note (Signed)
Chronic, ongoing.  A1c upward trend today at 10%, previous 8.8%. Has poor diet at home, which is major issue. Missed endo visit in September for initial check, new referral placed -- would benefit from further recommendations.   - Continue Jardiance and increase Tresiba to 40 units BID (would like to reduce this in future to be more in goal range for weight) + increase meal time Humalog to 12 units before meals.  Notified [name], his wife, to alert me if sugars remain consistently above 300 and will adjust further.  - Start Januvia 100 MG daily and see if tolerates. - Poor tolerance to Metformin in past with AKI presenting x 2 trials and allergic to Sulfa.  Would avoid GLP at this time due to patient history of pancreatitis x 3, concern this would flare.  Educated patient at length on effect of diabetes from head to toe and increased risk for recurrent CVA due to poor control.   - Recommend they check his BS TID -- he would benefit from [facility] will work on this with CCM team in future.  Goal A1C <6.5% due to CVA history -- which reiterated at length with patient today.  Continue collaboration with CCM team.  Return in 3 months.

## 2021-10-27 NOTE — Assessment & Plan Note (Signed)
BMI 29.88.  Recommended eating smaller high protein, low fat meals more frequently and exercising 30 mins a day 5 times a week with a goal of 10-15lb weight loss in the next 3 months. Patient voiced their understanding and motivation to adhere to these recommendations.

## 2021-10-27 NOTE — Assessment & Plan Note (Signed)
Continue collaboration with neurology and continue statin + ASA/Plavix.  Discussed goals BP <130/90, A1C < 6.5%, and LDL <70.  He is poorly controlled with diabetes, much related to knowledge base and poor diet.  Referral to endocrinology placed and have HIGHLY recommended he attend.

## 2021-10-27 NOTE — Assessment & Plan Note (Addendum)
Chronic, ongoing.  A1c upward trend today at 10%, previous 8.8%.  Urine ALB 28 April 2021. Has poor diet at home, which is major issue. Missed endo visit in September for initial check, new referral placed -- would benefit from further recommendations.   - Continue Jardiance and increase Tresiba to 40 units BID (would like to reduce this in future to be more in goal range for weight) + increase meal time Humalog to 12 units before meals.  Notified [name], his wife, to alert me if sugars remain consistently above 300 and will adjust further.  - Start Januvia 100 MG daily and see if tolerates. - Poor tolerance to Metformin in past with AKI presenting x 2 trials and allergic to Sulfa.  Would avoid GLP at this time due to patient history of pancreatitis x 3, concern this would flare.  Educated patient at length on effect of diabetes from head to toe and increased risk for recurrent CVA due to poor control.   - Recommend they check his BS TID -- he would benefit from [facility] will work on this with CCM team in future.  Goal A1C <6.5% due to CVA history -- which reiterated at length with patient today.  Continue collaboration with CCM team.  Return in 3 months.

## 2021-10-27 NOTE — Progress Notes (Signed)
BP 106/66 (BP Location: Left Arm, Patient Position: Sitting, Cuff Size: Normal)   Pulse 64   Ht 5' 4" (1.626 m)   Wt 174 lb 1.6 oz (79 kg)   SpO2 98%   BMI 29.88 kg/m    Subjective:    Patient ID: Alex Metro., male    DOB: 10-Apr-1960, 61 y.o.   MRN: [id number]  HPI: [name]. is a 61 y.o. male  Chief Complaint  Patient presents with   Diabetes   Hyperlipidemia   Hypertension   Gastroesophageal Reflux   Hip Pain    Patient says he is still experiencing pain in his right hip from an recent injection.   DIABETES & PHENYLKETONURIA A1c in May did trend down to 8.8% with addition of Humalog, previous was 10% range.  Continues Jardiance and Tresiba 40 units BID and started Humalog 5 units before meals.  He is working on diet changes with his wife.  Has not heard from endocrinology -- referral placed 07/26/21.  Has had phenylketonuria since childhood and monitors diet best he can per his report.  He has been cutting back on sodas.  Has tried Metformin in past x 2 both times AKI presented, history of pancreatitis and can not take GLP1.  On review tried Januvia in past, he does not recall any ADR with this. 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 Sees Dr. Humphrey Rolls for cardiology, has not seen in awhile -- reports he is to have stress test in upcoming months after cataract surgeries.  Saw Dr. Melrose Nakayama on  11/09/19 -- he is to continue Plavix and ASA per their note.  Saw vascular on 07/24/21 -- is supposed to get carotid doppler.   Current medications Lisinopril, Atorvastatin, Fenofibrate, NTG.   Satisfied with current treatment? yes Duration of hypertension: chronic BP monitoring frequency: daily BP range:  BP medication side effects: no Duration of hyperlipidemia: chronic Cholesterol medication side effects: no Cholesterol supplements: none Medication compliance: good compliance Aspirin: yes Recent stressors: no Recurrent headaches: no Visual changes: no Palpitations: no Dyspnea: occasional Chest pain: no Lower extremity edema: no Dizzy/lightheaded: no  PROTEINURIA Last visit with nephrology on 09/27/21. Proteinuria and CKD Stage II.  Low Vitamin D level on labs, reports he is taking supplement. CKD status: stable Medications renally dose: yes Previous renal evaluation: yes Pneumovax:  Up to Date Influenza Vaccine:  Up to Date    GERD  Continues on Omeprazole 20 MG daily.   GERD control status: stable Satisfied with current treatment? yes Heartburn frequency: occasional Medication side effects: no  Medication compliance: stable Previous GERD medications: Antacid use frequency:  none Dysphagia: no Odynophagia:  no Hematemesis: no Blood in stool: no EGD: yes   HIP PAIN Reports receiving recent injection to right hip with ortho 10/18/21 -- he reports this hurt and is still causing pain + is causing some weakness. To return in 2 weeks to see them. Duration: months Involved hip: right  Mechanism of injury: unknown Location: anterior Onset: gradual  Severity: 7/10  Quality: dull, aching, and throbbing Frequency: intermittent Radiation: no Aggravating factors: weight bearing, walking, and movement   Alleviating factors: rest  Status: fluctuating Treatments attempted: rest, heat, and APAP   Relief with NSAIDs?: No NSAIDs Taken Weakness with weight bearing: yes Weakness  with walking: yes Paresthesias / decreased sensation: no Swelling: no Redness:no Fevers: no   Relevant past medical, surgical, family and social history reviewed and updated as indicated. Interim medical history since our last visit reviewed. Allergies and medications reviewed and updated.  Review of Systems  Constitutional:  Negative for activity change, diaphoresis, fatigue and fever.  Respiratory:  Negative for cough, chest tightness, shortness of breath and wheezing.   Cardiovascular:  Negative for chest pain, palpitations and leg swelling.  Gastrointestinal: Negative.   Endocrine: Negative for cold intolerance, heat intolerance, polydipsia, polyphagia and polyuria.  Musculoskeletal:  Positive for arthralgias.  Neurological: Negative.   Psychiatric/Behavioral: Negative.     Per HPI unless specifically indicated above     Objective:    BP 106/66 (BP Location: Left Arm, Patient Position: Sitting, Cuff Size: Normal)   Pulse 64   Ht 5' 4" (1.626 m)   Wt 174 lb 1.6 oz (79 kg)   SpO2 98%   BMI 29.88 kg/m   Wt Readings from Last 3 Encounters:  10/27/21 174 lb 1.6 oz (79 kg)  08/01/21 176 lb (79.8 kg)  07/26/21 178 lb 3.2 oz (80.8 kg)    Physical Exam Vitals and nursing note reviewed.  Constitutional:      General: He is awake. He is not in acute distress.    Appearance: He is well-developed. He is obese. He is not ill-appearing.  HENT:     Head: Normocephalic and atraumatic.     Right Ear: Hearing normal. No drainage.     Left Ear: Hearing normal. No drainage.  Eyes:     General: Lids are normal.        Right eye: No discharge.        Left eye: No discharge.     Conjunctiva/sclera: Conjunctivae normal.     Pupils: Pupils are equal, round, and reactive to light.  Neck:     Thyroid: No thyromegaly.     Vascular: Carotid bruit (R>L) present.  Cardiovascular:     Rate and Rhythm: Normal rate and regular rhythm.     Heart sounds: S1 normal and S2 normal. Murmur heard.      Systolic murmur is present with a grade of 2/6.     No gallop.     Comments: Systolic murmur noted best at left sternal border, soft. Pulmonary:     Effort: Pulmonary effort is normal. No accessory muscle usage or respiratory distress.     Breath sounds: Normal breath sounds.  Abdominal:     General: Bowel sounds are normal.     Palpations: Abdomen is soft.  Musculoskeletal:     Cervical back: Normal range of motion and neck supple.     Right hip: Tenderness present. No deformity, bony tenderness or crepitus. Decreased range of motion. Normal strength.     Left hip: Normal.     Right lower leg: No edema.     Left lower leg: No edema.     Comments: Slight antalgic gait present.  Skin:    General: Skin is warm and dry.     Capillary Refill: Capillary refill takes less than 2 seconds.  Neurological:  Mental Status: He is alert and oriented to person, place, and time.     Deep Tendon Reflexes: Reflexes are normal and symmetric.  Psychiatric:        Attention and Perception: Attention normal.        Mood and Affect: Mood normal.        Speech: Speech normal.        Behavior: Behavior normal. Behavior is cooperative.     Results for orders placed or performed in visit on 10/27/21  Bayer DCA Hb A1c Waived  Result Value Ref Range   HB A1C (BAYER DCA - WAIVED) 10.0 (H) 4.8 - 5.6 %      Assessment & Plan:   Problem List Items Addressed This Visit       Cardiovascular and Mediastinum   Hypertension associated with diabetes ([place])    Chronic, with BP well below goal in office and on lower side.  Will stop current 20 MG Lisinopril and start 10 MG dosing, further adjust next visit if remains on lower side.  Labs: CMP and A1c.  Recommend they check his BP at home at least daily and document + focus on DASH diet.  Return to office in 3 months.      Relevant Medications   lisinopril (ZESTRIL) 10 MG tablet   sitaGLIPtin (JANUVIA) 100 MG tablet   Other Relevant Orders   Bayer  DCA Hb A1c Waived (Completed)   Comprehensive metabolic panel   Ambulatory referral to Endocrinology     Digestive   GERD (gastroesophageal reflux disease)    Chronic, ongoing.  Continue Omeprazole daily and adjust as needed.  Mag level up to date.  Have discussed past visits he does not have hernia on imaging, which he was upset about as he felt he had a hernia.        Endocrine   Hyperlipidemia associated with type 2 diabetes mellitus (HCC)    Chronic, ongoing.  Continue Lipitor and Fenofibrate. Lipid panel today.  Adjust doses as needed, if LDL >70.      Relevant Medications   lisinopril (ZESTRIL) 10 MG tablet   sitaGLIPtin (JANUVIA) 100 MG tablet   Other Relevant Orders   Bayer DCA Hb A1c Waived (Completed)   Comprehensive metabolic panel   Lipid Panel w/o Chol/HDL Ratio   Ambulatory referral to Endocrinology   Type 2 diabetes mellitus with proteinuria (HCC)    Chronic, ongoing.  A1c upward trend today at 10%, previous 8.8%.  Urine ALB 28 April 2021. Has poor diet at home, which is major issue. Missed endo visit in September for initial check, new referral placed -- would benefit from further recommendations.   - Continue Jardiance and increase Tresiba to 40 units BID (would like to reduce this in future to be more in goal range for weight) + increase meal time Humalog to 12 units before meals.  Notified [name], his wife, to alert me if sugars remain consistently above 300 and will adjust further.  - Start Januvia 100 MG daily and see if tolerates. - Poor tolerance to Metformin in past with AKI presenting x 2 trials and allergic to Sulfa.  Would avoid GLP at this time due to patient history of pancreatitis x 3, concern this would flare.  Educated patient at length on effect of diabetes from head to toe and increased risk for recurrent CVA due to poor control.   - Recommend they check his BS TID -- he would benefit from [facility] will work on  this with CCM team in future.  Goal  A1C <6.5% due to CVA history -- which reiterated at length with patient today.  Continue collaboration with CCM team.  Return in 3 months.      Relevant Medications   lisinopril (ZESTRIL) 10 MG tablet   sitaGLIPtin (JANUVIA) 100 MG tablet   Other Relevant Orders   Bayer DCA Hb A1c Waived (Completed)   Comprehensive metabolic panel   Ambulatory referral to Endocrinology   Uncontrolled type 2 diabetes mellitus with hyperglycemia (HCC) - Primary    Chronic, ongoing.  A1c upward trend today at 10%, previous 8.8%. Has poor diet at home, which is major issue. Missed endo visit in September for initial check, new referral placed -- would benefit from further recommendations.   - Continue Jardiance and increase Tresiba to 40 units BID (would like to reduce this in future to be more in goal range for weight) + increase meal time Humalog to 12 units before meals.  Notified [name], his wife, to alert me if sugars remain consistently above 300 and will adjust further.  - Start Januvia 100 MG daily and see if tolerates. - Poor tolerance to Metformin in past with AKI presenting x 2 trials and allergic to Sulfa.  Would avoid GLP at this time due to patient history of pancreatitis x 3, concern this would flare.  Educated patient at length on effect of diabetes from head to toe and increased risk for recurrent CVA due to poor control.   - Recommend they check his BS TID -- he would benefit from [facility] will work on this with CCM team in future.  Goal A1C <6.5% due to CVA history -- which reiterated at length with patient today.  Continue collaboration with CCM team.  Return in 3 months.      Relevant Medications   lisinopril (ZESTRIL) 10 MG tablet   sitaGLIPtin (JANUVIA) 100 MG tablet   Other Relevant Orders   Bayer DCA Hb A1c Waived (Completed)   Comprehensive metabolic panel   Ambulatory referral to Endocrinology     Other   Heart murmur    Grade 2/6 on auscultation, continue collaboration with  cardiology.        History of CVA (cerebrovascular accident)    Continue collaboration with neurology and continue statin + ASA/Plavix.  Discussed goals BP <130/90, A1C < 6.5%, and LDL <70.  He is poorly controlled with diabetes, much related to knowledge base and poor diet.  Referral to endocrinology placed and have HIGHLY recommended he attend.      History of pancreatitis    Would avoid medications that could flare acute event, such as GLP1.      Obesity    BMI 29.88.  Recommended eating smaller high protein, low fat meals more frequently and exercising 30 mins a day 5 times a week with a goal of 10-15lb weight loss in the next 3 months. Patient voiced their understanding and motivation to adhere to these recommendations.       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      Relevant Orders   Comprehensive metabolic panel   Right hip pain    Ongoing, followed by ortho at this time.  Some discomfort with ROM today.  Continue collaboration with ortho, had a recent steroid injection.  Recommend simple treatment, such  as Tylenol and Icy/Hot, at home.  If worsening return to office.      Vitamin D deficiency    Ongoing, stable.  Continue supplement and recheck level today.      Relevant Orders   VITAMIN D 25 Hydroxy (Vit-D Deficiency, Fractures)     Follow up plan: Return in about 3 months (around 01/27/2022) for T2DM, HTN/HLD, CKD, GERD, ASTHMA.

## 2021-10-27 NOTE — Assessment & Plan Note (Signed)
Chronic, with BP well below goal in office and on lower side.  Will stop current 20 MG Lisinopril and start 10 MG dosing, further adjust next visit if remains on lower side.  Labs: CMP and A1c.  Recommend they check his BP at home at least daily and document + focus on DASH diet.  Return to office in 3 months.

## 2021-10-27 NOTE — Assessment & Plan Note (Signed)
Ongoing, stable.  Continue supplement and recheck level today.

## 2021-10-27 NOTE — Assessment & Plan Note (Signed)
Grade 2/6 on auscultation, continue collaboration with cardiology.

## 2021-10-27 NOTE — Assessment & Plan Note (Signed)
Ongoing, followed by ortho at this time.  Some discomfort with ROM today.  Continue collaboration with ortho, had a recent steroid injection.  Recommend simple treatment, such as Tylenol and Icy/Hot, at home.  If worsening return to office.

## 2021-10-27 NOTE — Assessment & Plan Note (Signed)
Chronic, ongoing.  Continue Omeprazole daily and adjust as needed.  Mag level up to date.  Have discussed past visits he does not have hernia on imaging, which he was upset about as he felt he had a hernia.

## 2021-10-27 NOTE — Assessment & Plan Note (Signed)
Mental delays present, continue to work with CCM team in [place] with education and reiterating diet needs.

## 2021-10-28 ENCOUNTER — Other Ambulatory Visit: Payer: Self-pay | Admitting: Nurse Practitioner

## 2021-10-28 LAB — COMPREHENSIVE METABOLIC PANEL
ALT: 26 IU/L (ref 0–44)
AST: 24 IU/L (ref 0–40)
Albumin/Globulin Ratio: 1.9 (ref 1.2–2.2)
Albumin: 4.3 g/dL (ref 3.8–4.9)
Alkaline Phosphatase: 152 IU/L — ABNORMAL HIGH (ref 44–121)
BUN/Creatinine Ratio: 14 (ref 9–20)
BUN: 18 mg/dL (ref 6–24)
Bilirubin Total: 0.3 mg/dL (ref 0.0–1.2)
CO2: 18 mmol/L — ABNORMAL LOW (ref 20–29)
Calcium: 9.4 mg/dL (ref 8.7–10.2)
Chloride: 101 mmol/L (ref 96–106)
Creatinine, Ser: 1.27 mg/dL (ref 0.76–1.27)
Globulin, Total: 2.3 g/dL (ref 1.5–4.5)
Glucose: 295 mg/dL — ABNORMAL HIGH (ref 70–99)
Potassium: 4.1 mmol/L (ref 3.5–5.2)
Sodium: 135 mmol/L (ref 134–144)
Total Protein: 6.6 g/dL (ref 6.0–8.5)
eGFR: 65 mL/min/{1.73_m2} (ref >59)

## 2021-10-28 LAB — LIPID PANEL W/O CHOL/HDL RATIO
Cholesterol, Total: 250 mg/dL — ABNORMAL HIGH (ref 100–199)
HDL: 35 mg/dL — ABNORMAL LOW (ref >39)
LDL Chol Calc (NIH): 127 mg/dL — ABNORMAL HIGH (ref 0–99)
Triglycerides: 487 mg/dL — ABNORMAL HIGH (ref 0–149)
VLDL Cholesterol Cal: 88 mg/dL — ABNORMAL HIGH (ref 5–40)

## 2021-10-28 LAB — VITAMIN D 25 HYDROXY (VIT D DEFICIENCY, FRACTURES): Vit D, 25-Hydroxy: 21.1 ng/mL — ABNORMAL LOW (ref 30.0–100.0)

## 2021-10-28 MED ORDER — ROSUVASTATIN CALCIUM 40 MG PO TABS: 40.0000 mg | ORAL_TABLET | Freq: Every day | ORAL | 4 refills | Status: DC

## 2021-10-28 NOTE — Progress Notes (Signed)
Good morning, please let [name] know Gad's labs have returned and ensure she has read over instructions I sent on notes home with [facility]: - Kidney function, creatinine and eGFR, remains normal, as is liver function, AST and ALT.  Sugar is elevated though, make insulin changes as I instructed and start the [place] I sent into pharmacy ASAP as this is to help lower sugars (a pill he will take daily). - Vitamin D level remains on low side.  Please ensure he is taking Vitamin D supplement weekly that I sent in February to pharmacy. - Cholesterol levels remain above goal for stroke prevention, which is a concern.  I am going to stop Atorvastatin and start Rosuvastatin to see if we can get better cholesterol control.  I have sent this in and ensure he takes every day.  Any questions?  I have placed a referral to endocrinology, this is VERY important == please ensure to schedule with them when they call as we need to get his diabetes under control. Keep being amazing!!  Thank you for allowing me to participate in your care.  I appreciate you. Kindest regards, Arlester Keehan

## 2021-10-28 NOTE — Addendum Note (Signed)
Addended by: Marnee Guarneri T on: 10/28/2021 12:40 PM   Modules accepted: Orders

## 2021-10-30 ENCOUNTER — Telehealth: Payer: Self-pay

## 2021-10-30 NOTE — Telephone Encounter (Signed)
Pt given lab results per notes of Marnee Guarneri NP on 10/30/21. Pt verbalized understanding. Please send  Vitamin D rx to pharmacy. Please send hard copy of labs to pt.

## 2021-10-30 NOTE — Telephone Encounter (Signed)
Labs printed to be mailed to the patient.

## 2021-10-31 ENCOUNTER — Telehealth: Payer: Self-pay | Admitting: Nurse Practitioner

## 2021-10-31 ENCOUNTER — Telehealth: Payer: Self-pay

## 2021-10-31 ENCOUNTER — Other Ambulatory Visit (INDEPENDENT_AMBULATORY_CARE_PROVIDER_SITE_OTHER): Payer: Self-pay | Admitting: Vascular Surgery

## 2021-10-31 DIAGNOSIS — Z5982 Transportation insecurity: Secondary | ICD-10-CM

## 2021-10-31 DIAGNOSIS — I6523 Occlusion and stenosis of bilateral carotid arteries: Secondary | ICD-10-CM

## 2021-10-31 NOTE — Telephone Encounter (Signed)
Will coordinate with CCM team/PCP to get sugars under control. Sent CCM team the following msg   "Howdy Myriam, can you call this patient weekly until we get his sugars under control. Please get many many glucose readings (And time of day please!).   Also, we made a referral to Endocrinology. Can you please consistently ask/make sure he schedules/goes to this appointment as he has missed it in the past.  Whatever his sugar readings are, please let me and Jolene know so we can continue to increase his short term insulin until he's at goal/goes to Endo."

## 2021-10-31 NOTE — Telephone Encounter (Addendum)
Referral Request - Has patient seen PCP for this complaint? Yes.   Referral for which specialty: Diabetes specialist Endocrinoligst Preferred provider/office: [facility] anywhere Reason for referral: Diabetes under control  Patient states the provider referred him to a specialist in [place] but that he needs one in [facility]

## 2021-11-01 ENCOUNTER — Encounter (INDEPENDENT_AMBULATORY_CARE_PROVIDER_SITE_OTHER): Payer: Self-pay | Admitting: Nurse Practitioner

## 2021-11-01 ENCOUNTER — Telehealth: Payer: Self-pay | Admitting: Primary Care

## 2021-11-01 ENCOUNTER — Ambulatory Visit (INDEPENDENT_AMBULATORY_CARE_PROVIDER_SITE_OTHER): Payer: Medicare Other

## 2021-11-01 ENCOUNTER — Ambulatory Visit (INDEPENDENT_AMBULATORY_CARE_PROVIDER_SITE_OTHER): Payer: Medicare Other | Admitting: Nurse Practitioner

## 2021-11-01 VITALS — BP 135/77 | HR 54 | Resp 16 | Wt 174.6 lb

## 2021-11-01 DIAGNOSIS — I890 Lymphedema, not elsewhere classified: Secondary | ICD-10-CM | POA: Diagnosis not present

## 2021-11-01 DIAGNOSIS — I6523 Occlusion and stenosis of bilateral carotid arteries: Secondary | ICD-10-CM

## 2021-11-01 DIAGNOSIS — E1159 Type 2 diabetes mellitus with other circulatory complications: Secondary | ICD-10-CM | POA: Diagnosis not present

## 2021-11-01 DIAGNOSIS — I152 Hypertension secondary to endocrine disorders: Secondary | ICD-10-CM

## 2021-11-01 NOTE — Telephone Encounter (Signed)
Attempted to contact patient's wife to offer to schedule Palliative Consult, no answer - left message with reason for call along with my name and call back number requesting a return call.

## 2021-11-02 ENCOUNTER — Telehealth: Payer: Self-pay | Admitting: Primary Care

## 2021-11-03 ENCOUNTER — Telehealth: Payer: Self-pay | Admitting: Primary Care

## 2021-11-03 NOTE — Telephone Encounter (Signed)
Rec'd message from our Palliative On-Call Nurse stating that the wife had called in after hours and said that they have decided not to pursue Palliative services at this time.  I will notify Palliative Team and PCP.

## 2021-11-03 NOTE — Telephone Encounter (Signed)
Rec'd VM from wife [name], ret'd her call and spoke with her about Palliative services and she said that she would talk with her husband and call me back to let me know if patient wanted to pursue Palliative services or not.

## 2021-11-08 ENCOUNTER — Telehealth: Payer: Medicare Other

## 2021-11-08 ENCOUNTER — Ambulatory Visit: Payer: Self-pay

## 2021-11-08 NOTE — Patient Outreach (Signed)
  Care Coordination   Follow Up Visit Note   11/08/2021 Name: [name]. MRN: [id number] DOB: 1961/03/01  [name]. is a 61 y.o. year old male who sees [place], Henrine Screws T, NP for primary care. I  spoke to [facility], the patients wife on speaker phone with the patient   What matters to the patients health and wellness today?  Blood sugars are doing better this week.     Goals Addressed             This Visit's Progress    RNCM: Effective Management of DM       Care Coordination Interventions:  Lab Results  Component Value Date   HGBA1C 10.0 (H) 10/27/2021    Provided education to patient about basic DM disease process. 11-08-2021: Ongoing education and support for effective management of DM. The patient needs constant reminders of DM management and how to effectively manage. Extensive review and education today with the patients wife. The patients wife assist the patient with management of DM, dietary changes, and helping with appointments.  Reviewed medications with patient and discussed importance of medication adherence. 11-08-2021: The patient has started the Januvia and also switched from Atorvastatin to Rosuvastatin  Counseled on importance of regular laboratory monitoring as prescribed. 11-08-2021: The patient has regular lab testing. Discussed plans with patient for ongoing care management follow up and provided patient with direct contact information for care management team Provided patient with written educational materials related to hypo and hyperglycemia and importance of correct treatment Reviewed scheduled/upcoming provider appointments including: 01-29-2022 at 240 pm Advised patient, providing education and rationale, to check cbg BID and record, calling pcp for findings outside established parameters 11-04-2021 225  11-05-2021 161  11-06-2021 212 am  11-06-2021 180 pm  11-07-2021 196 am  11-07-2021 133 pm  11-08-2021 244  Review of patient  status, including review of consultants reports, relevant laboratory and other test results, and medications completed Screening for signs and symptoms of depression related to chronic disease state  Assessed social determinant of health barriers Having issues with his eyes. Seeing the eye doctor on a regular basis Will see specialist for NASH on 12-13-2021           SDOH assessments and interventions completed:  Yes  SDOH Interventions Today    Flowsheet Row Most Recent Value  SDOH Interventions   Food Insecurity Interventions Intervention Not Indicated  Housing Interventions Intervention Not Indicated  Stress Interventions Other (Comment)  [the patient was upset this week at the eye doctor, doing better today]  Transportation Interventions Intervention Not Indicated        Care Coordination Interventions Activated:  Yes  Care Coordination Interventions:  Yes, provided   Follow up plan: Follow up call scheduled for 02-01-2022 ay 1 pm    Encounter Outcome:  Pt. Visit Completed   Noreene Larsson RN, MSN, [facility] Mobile: [phone]

## 2021-11-08 NOTE — Patient Instructions (Signed)
Visit Information  Thank you for taking time to visit with me today. Please don't hesitate to contact me if I can be of assistance to you.   Following are the goals we discussed today:   Goals Addressed             This Visit's Progress    RNCM: Effective Management of DM       Care Coordination Interventions:  Lab Results  Component Value Date   HGBA1C 10.0 (H) 10/27/2021    Provided education to patient about basic DM disease process. 11-08-2021: Ongoing education and support for effective management of DM. The patient needs constant reminders of DM management and how to effectively manage. Extensive review and education today with the patients wife. The patients wife assist the patient with management of DM, dietary changes, and helping with appointments.  Reviewed medications with patient and discussed importance of medication adherence. 11-08-2021: The patient has started the Januvia and also switched from Atorvastatin to Rosuvastatin  Counseled on importance of regular laboratory monitoring as prescribed. 11-08-2021: The patient has regular lab testing. Discussed plans with patient for ongoing care management follow up and provided patient with direct contact information for care management team Provided patient with written educational materials related to hypo and hyperglycemia and importance of correct treatment Reviewed scheduled/upcoming provider appointments including: 01-29-2022 at 240 pm Advised patient, providing education and rationale, to check cbg BID and record, calling pcp for findings outside established parameters 11-04-2021 225  11-05-2021 161  11-06-2021 212 am  11-06-2021 180 pm  11-07-2021 196 am  11-07-2021 133 pm  11-08-2021 244  Review of patient status, including review of consultants reports, relevant laboratory and other test results, and medications completed Screening for signs and symptoms of depression related to chronic disease state  Assessed social  determinant of health barriers Having issues with his eyes. 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   If you are experiencing a Mental Health or [place] or need someone to talk to, please call the Suicide and Crisis Lifeline: 988 call the [place] National Suicide Prevention Lifeline: 716 308 9580 or TTY: (980)449-1568 TTY (303)366-4238) to talk to a trained counselor call 1-800-273-TALK (toll free, 24 hour hotline)  The patient verbalized understanding of instructions, educational materials, and care plan provided today and DECLINED offer to receive copy of patient instructions, educational materials, and care plan.   Telephone follow up appointment with care management team member scheduled for: 02-01-2022 at 1 pm  [place], MSN, [facility] Mobile: [phone]

## 2021-11-16 ENCOUNTER — Telehealth: Payer: Self-pay

## 2021-11-16 NOTE — Progress Notes (Signed)
Blood sugar report  11/12/21: 138 am 113 lunch 169 supper  11/14/21:161 am  11/15/21: 175 am 163 lunch 166 supper  11/16/21: 197 am 132 lunch  Patient sometimes forgets to check his blood sugar at times. Patient states he has not had a call about his appointment with his endocrinologist, looks like there is a note about the referral on 10/31/21 and patent states he does need a endocrinologist locally in [place].  Corrie Mckusick, [place]

## 2021-11-20 ENCOUNTER — Encounter (INDEPENDENT_AMBULATORY_CARE_PROVIDER_SITE_OTHER): Payer: Self-pay | Admitting: Nurse Practitioner

## 2021-11-20 NOTE — Progress Notes (Signed)
Subjective:    Patient ID: Alex Metro., male    DOB: 08/27/1960, 61 y.o.   MRN: [id number] Chief Complaint  Patient presents with   Follow-up    Ultrasound follow up    The patient is seen for follow up evaluation of carotid stenosis. The carotid stenosis followed by ultrasound.   The patient denies amaurosis fugax. There is no recent history of TIA symptoms or focal motor deficits. There is no prior documented CVA.  The patient is taking enteric-coated aspirin 81 mg daily.  There is no history of migraine headaches. There is no history of seizures.  No recent shortening of the patient's walking distance or new symptoms consistent with claudication.  No history of rest pain symptoms. No new ulcers or wounds of the lower extremities have occurred.  There is no history of DVT, PE or superficial thrombophlebitis. No recent episodes of angina or shortness of breath documented.   Carotid Duplex done today shows 1 to 39% stenosis on the right with 40 to 59% stenosis on the left.      Review of Systems  All other systems reviewed and are negative.      Objective:   Physical Exam Vitals reviewed.  HENT:     Head: Normocephalic.  Cardiovascular:     Rate and Rhythm: Normal rate.     Pulses: Normal pulses.  Pulmonary:     Effort: Pulmonary effort is normal.  Skin:    General: Skin is warm and dry.  Neurological:     Mental Status: He is alert and oriented to person, place, and time.  Psychiatric:        Mood and Affect: Mood normal.        Behavior: Behavior normal.        Thought Content: Thought content normal.        Judgment: Judgment normal.     BP 135/77 (BP Location: Left Arm)   Pulse (!) 54   Resp 16   Wt 174 lb 9.6 oz (79.2 kg)   BMI 29.97 kg/m   Past Medical History:  Diagnosis Date   Arrhythmia    Asthma    Cyst of kidney, acquired    Diabetes mellitus without complication ([place]) 4034   type 2   Edentulous    Fatty liver    GERD  (gastroesophageal reflux disease)    History of chicken pox    History of measles as a child    History of PKU    Hyperlipidemia    Hypertension    IBS (irritable bowel syndrome)    Irregular heart beat    Mentally challenged    Pancreatitis    Stroke ([place]) 03/02/2019   vision issues - right eye    Social History   Socioeconomic History   Marital status: Married    Spouse name: Not on file   Number of children: 0   Years of education: Not on file   Highest education level: 12th grade  Occupational History   Occupation: Disabled  Tobacco Use   Smoking status: Never   Smokeless tobacco: Never  Vaping Use   Vaping Use: Never used  Substance and Sexual Activity   Alcohol use: No    Alcohol/week: 0.0 standard drinks of alcohol   Drug use: No   Sexual activity: Not Currently  Other Topics Concern   Not on file  Social History Narrative   ** Merged History Encounter **       **  Data from: 10/22/14 Enc Dept: [facility]       ** Data from: 10/07/14 Enc Dept: [facility]   Arrest: Arrested in 2012 for indecent liberties, required to wear ankle monitor   Social Determinants of Health   Financial Resource Strain: Medium Risk (10/16/2021)   Overall Financial Resource Strain (CARDIA)    Difficulty of Paying Living Expenses: Somewhat hard  Food Insecurity: No Food Insecurity (11/08/2021)   Hunger Vital Sign    Worried About Running Out of Food in the Last Year: Never true    Ran Out of Food in the Last Year: Never true  Transportation Needs: No Transportation Needs (11/08/2021)   PRAPARE - Hydrologist (Medical): No    Lack of Transportation (Non-Medical): No  Physical Activity: Inactive (05/03/2021)   Exercise Vital Sign    Days of Exercise per Week: 0 days    Minutes of Exercise per Session: 0 min  Stress: Stress Concern Present (11/08/2021)   [place]     Feeling of Stress : To some extent  Social Connections: Moderately Integrated (05/03/2021)   Social Connection and Isolation Panel [NHANES]    Frequency of Communication with Friends and Family: Twice a week    Frequency of Social Gatherings with Friends and Family: Once a week    Attends Religious Services: More than 4 times per year    Active Member of [employer] or Organizations: No    Attends Archivist Meetings: Never    Marital Status: Married  Human resources officer Violence: Not At Risk (11/08/2021)   Humiliation, Afraid, Rape, and Kick questionnaire    Fear of Current or Ex-Partner: No    Emotionally Abused: No    Physically Abused: No    Sexually Abused: No    Past Surgical History:  Procedure Laterality Date   Cardiac Catherization     [place]   CATARACT EXTRACTION W/PHACO Left 06/05/2021   Procedure: CATARACT EXTRACTION PHACO AND INTRAOCULAR LENS PLACEMENT ([place]) LEFT DIABETIC 3.75 00:28.0;  Surgeon: Eulogio Bear, MD;  Location: [facility];  Service: Ophthalmology;  Laterality: Left;  Diabetic   CATARACT EXTRACTION W/PHACO Right 07/10/2021   Procedure: CATARACT EXTRACTION PHACO AND INTRAOCULAR LENS PLACEMENT ([facility]) RIGHT DIABETIC;  Surgeon: Eulogio Bear, MD;  Location: [facility];  Service: Ophthalmology;  Laterality: Right;  Diabetic 2.66 00:24.4   COLONOSCOPY     COLONOSCOPY WITH PROPOFOL N/A 08/10/2020   Procedure: COLONOSCOPY WITH PROPOFOL;  Surgeon: Lin Landsman, MD;  Location[facility] END[facility]COPY;  Service: Gastroenterology;  Laterality: N/A;  Has ankle monitor; (Parole officer "Ms Andree Moro needs a time before Monday [phone]" - per patient's wife)   ESOPHAGOGASTRODUODENOSCOPY (EGD) WITH PROPOFOL N/A 12/27/2016   Procedure: ESOPHAGOGASTRODUODENOSCOPY (EGD) WITH PROPOFOL;  Surgeon: Lin Landsman, MD;  Location: [facility];  Service: Gastroenterology;  Laterality: N/A;   HEMORRHOID SURGERY      Ligament Removal Left    of left thumb: dr. Cleda Mccreedy   ligament removal  of left thumb     Dr. Cleda Mccreedy   NM GATED MYOCARDIAL STUDY (ARMX HX)  06/23/2014   [place]. Normal    Family History  Problem Relation Age of Onset   Cancer Mother        throat   Diabetes Father    Heart disease Father    Cancer Maternal Grandmother    Cancer Maternal  Grandfather        pancreatic   Cancer Paternal Grandfather     Allergies  Allergen Reactions   Bee Venom Hives    All kinds of bees   Other     Certain powders   Sulfa Antibiotics        Latest Ref Rng & Units 04/26/2021    2:25 PM 01/18/2021    2:31 PM 05/23/2020    4:15 PM  CBC  WBC 3.4 - 10.8 x10E3/uL 4.8  5.0  5.6   Hemoglobin 13.0 - 17.7 g/dL 14.6  13.4  13.7   Hematocrit 37.5 - 51.0 % 43.9  42.6  42.6   Platelets 150 - 450 x10E3/uL 174  176  196       CMP     Component Value Date/Time   NA 135 10/27/2021 1606   K 4.1 10/27/2021 1606   CL 101 10/27/2021 1606   CO2 18 (L) 10/27/2021 1606   GLUCOSE 295 (H) 10/27/2021 1606   GLUCOSE 157 (H) 03/09/2019 0740   BUN 18 10/27/2021 1606   CREATININE 1.27 10/27/2021 1606   CALCIUM 9.4 10/27/2021 1606   PROT 6.6 10/27/2021 1606   ALBUMIN 4.3 10/27/2021 1606   AST 24 10/27/2021 1606   ALT 26 10/27/2021 1606   ALKPHOS 152 (H) 10/27/2021 1606   BILITOT 0.3 10/27/2021 1606   GFRNONAA 66 02/24/2020 1622   GFRAA 76 02/24/2020 1622     No results found.     Assessment & Plan:   1. Atherosclerosis of both carotid arteries Recommend:  Given the patient's asymptomatic subcritical stenosis no further invasive testing or surgery at this time.  Duplex ultrasound shows <50% stenosis bilaterally.  Continue antiplatelet therapy as prescribed Continue management of CAD, HTN and Hyperlipidemia Healthy heart diet,  encouraged exercise at least 4 times per week Follow up in 12 months with duplex ultrasound and physical exam    2. Hypertension associated with diabetes  ([place]) Continue antihypertensive medications as already ordered, these medications have been reviewed and there are no changes at this time.   3. Lymphedema Recommend:  No surgery or intervention at this point in time.    I have reviewed my discussion with the patient regarding lymphedema and why it  causes symptoms.  Patient will continue wearing graduated compression on a daily basis. The patient should put the compression on first thing in the morning and removing them in the evening. The patient should not sleep in the compression.   In addition, behavioral modification throughout the day will be continued.  This will include frequent elevation (such as in a recliner), use of over the counter pain medications as needed and exercise such as walking.     Current Outpatient Medications on File Prior to Visit  Medication Sig Dispense Refill   aspirin 81 MG EC tablet Take 1 tablet (81 mg total) by mouth daily. 180 tablet 0   Blood Glucose Monitoring Suppl (ONE TOUCH ULTRA 2) w/Device KIT Use to check blood sugar 4 times a day 1 kit 0   BREO ELLIPTA 100-25 MCG/ACT AEPB INHALE 1 PUFF BY MOUTH ONCE DAILY 60 each 2   Cholecalciferol 1.25 MG (50000 UT) TABS Take 1 tablet by mouth once a week. 12 tablet 3   clopidogrel (PLAVIX) 75 MG tablet Take 1 tablet by mouth daily.     EPINEPHRINE 0.3 mg/0.3 mL IJ SOAJ injection INJECT 1 SYRINGE INTO OUTER THIGH ONCE AS NEEDED FOR SEVERE ALLERGIC REACTION. 2 each  1   fenofibrate 54 MG tablet TAKE 1 TABLET BY MOUTH ONCE DAILY 90 tablet 0   gabapentin (NEURONTIN) 300 MG capsule TAKE 1 CAPSULE BY MOUTH TWICE DAILY 180 capsule 1   glucose blood (ONETOUCH ULTRA) test strip 1 each by Other route 5 (five) times daily. 300 each 3   insulin degludec (TRESIBA FLEXTOUCH) 100 UNIT/ML FlexTouch Pen Take 45 units into the skin in morning and 45 units into skin every evening. 9 mL 4   insulin lispro (HUMALOG KWIKPEN) 100 UNIT/ML KwikPen Inject 8 Units into the skin 3  (three) times daily. Prior to breakfast, lunch, and dinner. 15 mL 4   Insulin Pen Needle (PEN NEEDLES) 32G X 4 MM MISC 1 kit by Does not apply route daily. 100 each 4   JARDIANCE 25 MG TABS tablet TAKE 1 TABLET BY MOUTH ONCE DAILY 90 tablet 1   Lancets (ONETOUCH DELICA PLUS [id number]) MISC USE AS DIRECTED. 100 each 12   lisinopril (ZESTRIL) 10 MG tablet Take 1 tablet (10 mg total) by mouth daily. 90 tablet 4   Misc. Devices (PULSE OXIMETER FOR FINGER) MISC To check O2 saturations once daily with asthma and document + check if any shortness of breath 1 each 1   montelukast (SINGULAIR) 10 MG tablet TAKE 1 TABLET BY MOUTH AT BEDTIME 90 tablet 1   moxifloxacin (VIGAMOX) 0.5 % ophthalmic solution Apply to eye.     nitroGLYCERIN (NITROSTAT) 0.4 MG SL tablet Place 1 tablet (0.4 mg total) under the tongue every 5 (five) minutes as needed for chest pain. 50 tablet 3   omeprazole (PRILOSEC) 20 MG capsule TAKE 1 CAPSULE BY MOUTH ONCE DAILY 90 capsule 1   rosuvastatin (CRESTOR) 40 MG tablet Take 1 tablet (40 mg total) by mouth daily. 90 tablet 4   sitaGLIPtin (JANUVIA) 100 MG tablet Take 1 tablet (100 mg total) by mouth daily. 90 tablet 4   tiZANidine (ZANAFLEX) 2 MG tablet Take 1 tablet (2 mg total) by mouth every 6 (six) hours as needed for muscle spasms. 30 tablet 1   traMADol (ULTRAM) 50 MG tablet Take 1 tablet every 6 hours by oral route.     triamcinolone cream (KENALOG) 0.1 % Apply 1 application. topically 2 (two) times daily. 30 g 1   trimethoprim-polymyxin b (POLYTRIM) ophthalmic solution Place 1 drop into the right eye every 6 (six) hours.      ULTRACARE PEN NEEDLES 32G X 5 MM MISC      valACYclovir (VALTREX) 1000 MG tablet Take 1,000 mg by mouth 3 (three) times daily.     valACYclovir (VALTREX) 500 MG tablet Take by mouth.     vitamin B-12 (CYANOCOBALAMIN) 1000 MCG tablet Take 1 tablet (1,000 mcg total) by mouth daily. 90 tablet 4   No current facility-administered medications on file prior to  visit.    There are no Patient Instructions on file for this visit. No follow-ups on file.   Kris Hartmann, NP

## 2021-11-22 ENCOUNTER — Telehealth: Payer: Self-pay

## 2021-11-22 NOTE — Telephone Encounter (Signed)
Telephone encounter was:  Successful.  11/22/2021 Name: [name]. MRN: [id number] DOB: Aug 21, 1960  [name]. is a 61 y.o. year old male who is a primary care patient of Cannady, Barbaraann Faster, NP . The community resource team was consulted for assistance with Transportation Needs   Care guide performed the following interventions: Spoke with patient about his transportation benefit with [employer] [phone]. Patient stated that he did not need transportation as this time and he is able to drive. I reminded the patient if he has his insurance card he could look on the back and call customer service if he needed transportation.  Follow Up Plan:  No further follow up planned at this time. The patient has been provided with needed resources.  [place] Resource Care Guide   ??[email or web address]  ?? [phone]   Website: [email or web address]  [email or web address]  "We don't say no, we SHOW how!"         [facility]

## 2021-11-24 ENCOUNTER — Telehealth: Payer: Self-pay

## 2021-11-24 NOTE — Progress Notes (Signed)
Chronic Care Management Pharmacy Assistant   Name: [name].  MRN: [id number] DOB: Nov 30, 1960  Reason for Encounter: Disease State   Conditions to be addressed/monitored: DMII   Recent office visits:  None since the last coordination call 11/08/21  Recent consult visits:  None since the last coordination call 11/08/21  Hospital visits:  None in previous 6 months  Medications: Outpatient Encounter Medications as of 11/24/2021  Medication Sig   aspirin 81 MG EC tablet Take 1 tablet (81 mg total) by mouth daily.   Blood Glucose Monitoring Suppl (ONE TOUCH ULTRA 2) w/Device KIT Use to check blood sugar 4 times a day   BREO ELLIPTA 100-25 MCG/ACT AEPB INHALE 1 PUFF BY MOUTH ONCE DAILY   Cholecalciferol 1.25 MG (50000 UT) TABS Take 1 tablet by mouth once a week.   clopidogrel (PLAVIX) 75 MG tablet Take 1 tablet by mouth daily.   EPINEPHRINE 0.3 mg/0.3 mL IJ SOAJ injection INJECT 1 SYRINGE INTO OUTER THIGH ONCE AS NEEDED FOR SEVERE ALLERGIC REACTION.   fenofibrate 54 MG tablet TAKE 1 TABLET BY MOUTH ONCE DAILY   gabapentin (NEURONTIN) 300 MG capsule TAKE 1 CAPSULE BY MOUTH TWICE DAILY   glucose blood (ONETOUCH ULTRA) test strip 1 each by Other route 5 (five) times daily.   insulin degludec (TRESIBA FLEXTOUCH) 100 UNIT/ML FlexTouch Pen Take 45 units into the skin in morning and 45 units into skin every evening.   insulin lispro (HUMALOG KWIKPEN) 100 UNIT/ML KwikPen Inject 8 Units into the skin 3 (three) times daily. Prior to breakfast, lunch, and dinner.   Insulin Pen Needle (PEN NEEDLES) 32G X 4 MM MISC 1 kit by Does not apply route daily.   JARDIANCE 25 MG TABS tablet TAKE 1 TABLET BY MOUTH ONCE DAILY   Lancets (ONETOUCH DELICA PLUS [id number]) MISC USE AS DIRECTED.   lisinopril (ZESTRIL) 10 MG tablet Take 1 tablet (10 mg total) by mouth daily.   Misc. Devices (PULSE OXIMETER FOR FINGER) MISC To check O2 saturations once daily with asthma and document + check if any  shortness of breath   montelukast (SINGULAIR) 10 MG tablet TAKE 1 TABLET BY MOUTH AT BEDTIME   moxifloxacin (VIGAMOX) 0.5 % ophthalmic solution Apply to eye.   nitroGLYCERIN (NITROSTAT) 0.4 MG SL tablet Place 1 tablet (0.4 mg total) under the tongue every 5 (five) minutes as needed for chest pain.   omeprazole (PRILOSEC) 20 MG capsule TAKE 1 CAPSULE BY MOUTH ONCE DAILY   rosuvastatin (CRESTOR) 40 MG tablet Take 1 tablet (40 mg total) by mouth daily.   sitaGLIPtin (JANUVIA) 100 MG tablet Take 1 tablet (100 mg total) by mouth daily.   tiZANidine (ZANAFLEX) 2 MG tablet Take 1 tablet (2 mg total) by mouth every 6 (six) hours as needed for muscle spasms.   traMADol (ULTRAM) 50 MG tablet Take 1 tablet every 6 hours by oral route.   triamcinolone cream (KENALOG) 0.1 % Apply 1 application. topically 2 (two) times daily.   trimethoprim-polymyxin b (POLYTRIM) ophthalmic solution Place 1 drop into the right eye every 6 (six) hours.    ULTRACARE PEN NEEDLES 32G X 5 MM MISC    valACYclovir (VALTREX) 1000 MG tablet Take 1,000 mg by mouth 3 (three) times daily.   valACYclovir (VALTREX) 500 MG tablet Take by mouth.   vitamin B-12 (CYANOCOBALAMIN) 1000 MCG tablet Take 1 tablet (1,000 mcg total) by mouth daily.   No facility-administered encounter medications on file as of 11/24/2021.   Recent  Relevant Labs: Lab Results  Component Value Date/Time   HGBA1C 10.0 (H) 10/27/2021 04:03 PM   HGBA1C 8.8 (H) 07/26/2021 03:20 PM   MICROALBUR 10 04/26/2021 02:22 PM   MICROALBUR 10 01/18/2021 02:26 PM   MICROALBUR 20 08/22/2015 05:04 PM    Kidney Function Lab Results  Component Value Date/Time   CREATININE 1.27 10/27/2021 04:06 PM   CREATININE 1.02 07/26/2021 03:22 PM   GFRNONAA 66 02/24/2020 04:22 PM   GFRAA 76 02/24/2020 04:22 PM   3 attempts were made to reach patient for diabetic assessment call. Left messages for patient to return call.  Current antihyperglycemic regimen:  Tresiba 48 units BID Humalog 8  units before meals  Jardiance 25 mg once daily  What recent interventions/DTPs have been made to improve glycemic control:   Per, [name] on 10/27/21, Has poor diet at home, which is major issue. Missed endo visit in September for initial check, new referral placed -- would benefit from further recommendations.   - Continue Jardiance and increase Tresiba to 40 units BID (would like to reduce this in future to be more in goal range for weight) + increase meal time Humalog to 12 units before meals.  Notified [name], his wife, to alert me if sugars remain consistently above 300 and will adjust further.   Recommend they check his BS TID -- he would benefit from [facility] will work on this with CCM team in future.  Goal A1C <6.5% due to CVA history   Have there been any recent hospitalizations or ED visits since last visit with CPP? No, none since last coordination call on 10/16/21  Patient denies hypoglycemic symptoms, including None  Patient denies hyperglycemic symptoms, including none  How often are you checking your blood sugar? 3-4 times daily  What are your blood sugars ranging? 120-180 Fasting: 125 yesterday morning  Bedtime: 140 yesterday night  During the week, how often does your blood glucose drop below 70? Never  Are you checking your feet daily/regularly? Patient stated that his feet are dry and that he has new diabetic shoes but no issues with swelling, redness or open sores.  Adherence Review: Is the patient currently on a STATIN medication? Yes, Rosuvastatin 40 mg.  Is the patient currently on ACE/ARB medication? Yes, lisinopril 10 mg.  Does the patient have >5 day gap between last estimated fill dates? Yes, Jardiance 25 mg   Care Gaps: Colonoscopy-08/10/20 Diabetic Foot Exam-05/24/21 Ophthalmology-05/25/21 Dexa Scan - NA Annual Well Visit - 05/03/21 (Medicare) Micro albumin-04/26/21 Hemoglobin A1c- 10/27/21  Star Rating Drugs: Jardiance 25 mg-last fill 08/24/21  90 ds, 03/28/21 90 ds (Patient states that he does not know when he has this last filled but has one bottle on hand)  Lisinopril 10 mg-last fill 10/27/21 90 ds, Lisinopril 20 mg-08/24/21 90 ds Rosuvastatin 40 mg-last fill 10/28/21 90 ds  [place] [phone]

## 2021-11-27 ENCOUNTER — Telehealth: Payer: Self-pay | Admitting: Nurse Practitioner

## 2021-11-27 NOTE — Telephone Encounter (Signed)
Copied from [place] [phone]. Topic: Referral - Question >> Nov 27, 2021  2:36 PM Erskine Squibb wrote: Reason for CRM: [name] with Dr Ventura Bruns office called stating she needs office visit notes and any lab work faxed to her at [phone]. She states she only received a referral and demo information and needs the other information. Please assist as soon as possible

## 2021-12-13 ENCOUNTER — Ambulatory Visit: Payer: Medicare Other | Admitting: Gastroenterology

## 2021-12-15 ENCOUNTER — Other Ambulatory Visit: Payer: Self-pay | Admitting: Nurse Practitioner

## 2021-12-15 DIAGNOSIS — R062 Wheezing: Secondary | ICD-10-CM

## 2021-12-18 ENCOUNTER — Telehealth: Payer: Self-pay | Admitting: Nurse Practitioner

## 2021-12-18 NOTE — Telephone Encounter (Signed)
Pts wife stated she has filled out what she needed and does not need any assistance at this time.

## 2021-12-18 NOTE — Telephone Encounter (Signed)
Requested Prescriptions  Pending Prescriptions Disp Refills  . fenofibrate 54 MG tablet [Pharmacy Med Name: FENOFIBRATE 54 MG TAB] 90 tablet 3    Sig: TAKE 1 TABLET BY MOUTH ONCE DAILY     Cardiovascular:  Antilipid - Fibric Acid Derivatives Failed - 12/15/2021  5:55 PM      Failed - Lipid Panel in normal range within the last 12 months    Cholesterol, Total  Date Value Ref Range Status  10/27/2021 250 (H) 100 - 199 mg/dL Final   Cholesterol Piccolo, Waived  Date Value Ref Range Status  12/02/2018 136 <200 mg/dL Final    Comment:                            Desirable                <200                         Borderline High      200- 239                         High                     >239    LDL Chol Calc (NIH)  Date Value Ref Range Status  10/27/2021 127 (H) 0 - 99 mg/dL Final   HDL  Date Value Ref Range Status  10/27/2021 35 (L) >39 mg/dL Final   Triglycerides  Date Value Ref Range Status  10/27/2021 487 (H) 0 - 149 mg/dL Final   Triglycerides Piccolo,Waived  Date Value Ref Range Status  12/02/2018 145 <150 mg/dL Final    Comment:                            Normal                   <150                         Borderline High     150 - 199                         High                200 - 499                         Very High                >499          Passed - ALT in normal range and within 360 days    ALT  Date Value Ref Range Status  10/27/2021 26 0 - 44 IU/L Final         Passed - AST in normal range and within 360 days    AST  Date Value Ref Range Status  10/27/2021 24 0 - 40 IU/L Final         Passed - Cr in normal range and within 360 days    Creatinine, Ser  Date Value Ref Range Status  10/27/2021 1.27 0.76 - 1.27 mg/dL Final   Creatinine, POC  Date Value Ref Range Status  08/22/2015 n/a mg/dL Final  Passed - HGB in normal range and within 360 days    Hemoglobin  Date Value Ref Range Status  04/26/2021 14.6 13.0 - 17.7 g/dL  Final         Passed - HCT in normal range and within 360 days    Hematocrit  Date Value Ref Range Status  04/26/2021 43.9 37.5 - 51.0 % Final         Passed - PLT in normal range and within 360 days    Platelets  Date Value Ref Range Status  04/26/2021 174 150 - 450 x10E3/uL Final         Passed - WBC in normal range and within 360 days    WBC  Date Value Ref Range Status  04/26/2021 4.8 3.4 - 10.8 x10E3/uL Final  03/09/2019 6.9 4.0 - 10.5 K/uL Final         Passed - eGFR is 30 or above and within 360 days    GFR calc Af Amer  Date Value Ref Range Status  02/24/2020 76 >59 mL/min/1.73 Final    Comment:    **In accordance with recommendations from the NKF-ASN Task force,**   [facility] is in the process of updating its eGFR calculation to the   2021 CKD-EPI creatinine equation that estimates kidney function   without a race variable.    GFR calc non Af Amer  Date Value Ref Range Status  02/24/2020 66 >59 mL/min/1.73 Final   eGFR  Date Value Ref Range Status  10/27/2021 65 >59 mL/min/1.73 Final         Passed - Valid encounter within last 12 months    Recent Outpatient Visits          1 month ago Uncontrolled type 2 diabetes mellitus with hyperglycemia ([place])   [place], Jolene T, NP   4 months ago NAFLD (nonalcoholic fatty liver disease)   [place] Cannady, Jolene T, NP   4 months ago Uncontrolled type 2 diabetes mellitus with hyperglycemia ([place])   [place], Jolene T, NP   6 months ago Type 2 diabetes mellitus with proteinuria ([place])   [place], Jolene T, NP   7 months ago Uncontrolled type 2 diabetes mellitus with hyperglycemia ([place])   [place], [place] T, NP      Future Appointments            In 1 month Cannady, Jolene T, NP [facility]           . montelukast (SINGULAIR) 10 MG tablet [Pharmacy Med Name: MONTELUKAST SODIUM 10  MG TAB] 90 tablet 3    Sig: TAKE 1 TABLET BY MOUTH AT BEDTIME     Pulmonology:  Leukotriene Inhibitors Passed - 12/15/2021  5:55 PM      Passed - Valid encounter within last 12 months    Recent Outpatient Visits          1 month ago Uncontrolled type 2 diabetes mellitus with hyperglycemia ([place])   [place], Jolene T, NP   4 months ago NAFLD (nonalcoholic fatty liver disease)   [place] Cannady, Jolene T, NP   4 months ago Uncontrolled type 2 diabetes mellitus with hyperglycemia ([place])   [place], Jolene T, NP   6 months ago Type 2 diabetes mellitus with proteinuria ([place])   [place], Jolene T, NP   7 months ago Uncontrolled type 2 diabetes  mellitus with hyperglycemia ([place])   [place], Barbaraann Faster, NP      Future Appointments            In 1 month Cannady, Barbaraann Faster, NP [facility], [facility]           . BREO ELLIPTA 100-25 MCG/ACT AEPB [Pharmacy Med Name: BREO ELLIPTA 100-25 MCG/ACT INH AEP] 60 each 2    Sig: INHALE 1 PUFF BY MOUTH ONCE DAILY     Pulmonology:  Combination Products Passed - 12/15/2021  5:55 PM      Passed - Valid encounter within last 12 months    Recent Outpatient Visits          1 month ago Uncontrolled type 2 diabetes mellitus with hyperglycemia ([place])   [place], Jolene T, NP   4 months ago NAFLD (nonalcoholic fatty liver disease)   [place] Cannady, Jolene T, NP   4 months ago Uncontrolled type 2 diabetes mellitus with hyperglycemia ([place])   [place], Jolene T, NP   6 months ago Type 2 diabetes mellitus with proteinuria ([place])   [place], Jolene T, NP   7 months ago Uncontrolled type 2 diabetes mellitus with hyperglycemia ([place])   [place], Barbaraann Faster, NP      Future Appointments            In 1 month Cannady, Barbaraann Faster, NP [facility], [facility]

## 2021-12-18 NOTE — Telephone Encounter (Signed)
Copied from [place] [phone]. Topic: General - Other >> Dec 18, 2021  2:03 PM [id number] J wrote: Reason for CRM: pt's spouse called in for assistance She said that they received a form from disability asking some questions that they are unsure of pertaining to pt's doctor visit. Spouse( [name]) would like a call back to discuss further when possible.

## 2021-12-20 DIAGNOSIS — I10 Essential (primary) hypertension: Secondary | ICD-10-CM | POA: Diagnosis not present

## 2021-12-20 DIAGNOSIS — G459 Transient cerebral ischemic attack, unspecified: Secondary | ICD-10-CM | POA: Diagnosis not present

## 2021-12-20 DIAGNOSIS — E1165 Type 2 diabetes mellitus with hyperglycemia: Secondary | ICD-10-CM | POA: Diagnosis not present

## 2021-12-20 DIAGNOSIS — K219 Gastro-esophageal reflux disease without esophagitis: Secondary | ICD-10-CM | POA: Diagnosis not present

## 2021-12-22 DIAGNOSIS — I10 Essential (primary) hypertension: Secondary | ICD-10-CM | POA: Diagnosis not present

## 2021-12-22 DIAGNOSIS — E1165 Type 2 diabetes mellitus with hyperglycemia: Secondary | ICD-10-CM | POA: Diagnosis not present

## 2021-12-22 DIAGNOSIS — K219 Gastro-esophageal reflux disease without esophagitis: Secondary | ICD-10-CM | POA: Diagnosis not present

## 2021-12-22 DIAGNOSIS — G459 Transient cerebral ischemic attack, unspecified: Secondary | ICD-10-CM | POA: Diagnosis not present

## 2021-12-25 ENCOUNTER — Ambulatory Visit (INDEPENDENT_AMBULATORY_CARE_PROVIDER_SITE_OTHER): Payer: Medicare Other

## 2021-12-25 ENCOUNTER — Ambulatory Visit: Payer: Self-pay | Admitting: *Deleted

## 2021-12-25 DIAGNOSIS — E1159 Type 2 diabetes mellitus with other circulatory complications: Secondary | ICD-10-CM

## 2021-12-25 DIAGNOSIS — E1169 Type 2 diabetes mellitus with other specified complication: Secondary | ICD-10-CM

## 2021-12-25 DIAGNOSIS — R809 Proteinuria, unspecified: Secondary | ICD-10-CM

## 2021-12-25 NOTE — Patient Instructions (Signed)
Visit Information   Goals Addressed   None    Patient Care Plan: General Social Work (Adult)     Problem Identified: Coping Skills (General Plan of Care)      Long-Range Goal: Coping Skills Enhanced Completed 09/11/2021  Start Date: 05/16/2020  This Visit's Progress: On track  Recent Progress: On track  Priority: Medium  Note:   Current Barriers:  Financial constraints Limited social support Level of care concerns ADL IADL limitations Literacy concerns Cognitive Deficits Lacks knowledge of community resource: available financial support resources within the area as well as socialization opportunities within the area Grief support related to the recent loss of his mother Clinical Social Work Clinical Goal(s):  Over the next 120 days, client will work with SW to address concerns related to improving self-care and implementing it whenever able Interventions: Patient and spouse reports increase in stress after pt caught a virus after having an eye surgery. They are waiting for biopsy results.  Family are "taking things one day at a time" Stress management strategies discussed Patient and spouse receives strong support from family to assist them with paying rent online Grief symptoms managed well 1:1 collaboration with primary care provider regarding development and update of comprehensive plan of care as evidenced by provider attestation and co-signature Inter-disciplinary care team collaboration (see longitudinal plan of care) Evaluation of current treatment plan related to  self management and patient's adherence to plan as established by provider Mental Health:  (Status: Goal on Track (progressing): YES.) Evaluation of current treatment plan related to Grief Active listening / Reflection utilized  Emotional Support Provided Task & activities to accomplish goals: Continue with compliance of taking medication  Call Doctor for any ongoing needs Utilize healthy coping skills  discussed    Patient Care Plan: RNCM: Diabetes Type 2 (Adult)  Completed 01/03/2021   Problem Identified: RNCM: Glycemic Management (Diabetes, Type 2) Resolved 01/03/2021  Priority: High     Long-Range Goal: RNCM: Glycemic Management Optimized Completed 01/03/2021  Start Date: 04/13/2020  Expected End Date: 10/19/2021  Recent Progress: On track  Priority: High  Note:   Objective: resolving due to duplicate goal Lab Results  Component Value Date   HGBA1C 9.9 (H) 09/21/2020    Lab Results  Component Value Date   CREATININE 1.03 06/01/2020   CREATININE 1.26 05/23/2020   CREATININE 1.21 02/24/2020    No results found for: EGFR Current Barriers:  Knowledge Deficits related to basic Diabetes pathophysiology and self care/management Knowledge Deficits related to medications used for management of diabetes Financial Constraints Literacy barriers Cognitive Deficits Does not use cbg meter  Limited Social Support Unable to independently manage DM Does not adhere to provider recommendations re: Heart healthy/ADA Does not adhere to prescribed medication regimen Lacks social connections Does not contact provider office for questions/concerns Case Manager Clinical Goal(s):  Collaboration with [name], NP regarding development and update of comprehensive plan of care as evidenced by provider attestation and co-signature Inter-disciplinary care team collaboration (see longitudinal plan of care) patient will demonstrate improved adherence to prescribed treatment plan for diabetes self care/management as evidenced by:  daily monitoring and recording of [id number] to ADA/ carb modified diet. exercise 4/5 days/week adherence to prescribed medication regimen- 10-24-2020: States compliance with medications regimen.  Interventions:  Provided education to patient about basic DM disease process Reviewed medications with patient and discussed importance of medication  adherence Discussed plans with patient for ongoing care management follow up and provided patient with direct contact information for  care management team Provided patient with written educational materials related to hypo and hyperglycemia and importance of correct treatment. 10-24-2020: Review and the patient denies any lows at this time. Will continue to monitor and educate  Advised patient, providing education and rationale, to check cbg BID and record, calling pcp for findings outside established parameters.   10-24-2020: Review of the patients readings. The patients wife reports he is doing better but still having high readings at times. Range 100 to 300 per wife today Review of patient status, including review of consultants reports, relevant laboratory and other test results, and medications completed. Review of dietary restrictions: 10-24-2020: Review of heart healthy/ADA diet. The patient is drinking water and using flavor packs to flavor his water. He also is trying to eat healthier snacks.  Patient Goals/Self-Care Activities Over the next 120 days, patient will:  - UNABLE to independently manage DM Self administers oral medications as prescribed Attends all scheduled provider appointments Checks blood sugars as prescribed and utilize hyper and hypoglycemia protocol as needed Adheres to prescribed ADA/carb modified - barriers to adherence to treatment plan identified - blood glucose monitoring encouraged - blood glucose readings reviewed - individualized medical nutrition therapy provided - mutual A1C goal set or reviewed - resources required to improve adherence to care identified - self-awareness of signs/symptoms of hypo or hyperglycemia encouraged - use of blood glucose monitoring log promoted Follow Up Plan: Telephone follow up appointment with care management team member scheduled for: 01-03-2021 at 1:45pm    Task: RNCM: Alleviate Barriers to Glycemic Management Completed 10/24/2020   Outcome: Positive  Note:   Care Management Activities:    - barriers to adherence to treatment plan identified - blood glucose monitoring encouraged - blood glucose readings reviewed - individualized medical nutrition therapy provided - mutual A1C goal set or reviewed - resources required to improve adherence to care identified - self-awareness of signs/symptoms of hypo or hyperglycemia encouraged - use of blood glucose monitoring log promoted        Patient Care Plan: RNCM: Hypertension (Adult)  Completed 01/03/2021   Problem Identified: RNCM: Hypertension (Hypertension) Resolved 01/03/2021  Priority: Medium     Long-Range Goal: RNCM: Hypertension Monitored Completed 01/03/2021  Start Date: 04/13/2020  Expected End Date: 10/19/2021  Recent Progress: On track  Priority: Medium  Note:   Objective: Resolving due to duplicate goal Last practice recorded BP readings:  BP Readings from Last 3 Encounters:  09/21/20 106/64  08/10/20 126/62  08/03/20 128/74    Most recent eGFR/CrCl: No results found for: EGFR  No components found for: CRCL Current Barriers:  Knowledge Deficits related to basic understanding of hypertension pathophysiology and self care management Knowledge Deficits related to understanding of medications prescribed for management of hypertension Non-adherence to prescribed medication regimen Literacy barriers Cognitive Deficits Limited Social Support Unable to independently manage HTN Unable to self administer medications as prescribed Does not adhere to provider recommendations re: refraining from salt use in diet Lacks social connections Does not contact provider office for questions/concerns Case Manager Clinical Goal(s):  patient will verbalize understanding of plan for hypertension management patient will attend all scheduled medical appointments: several upcoming appointments with specialist   patient will demonstrate improved adherence to prescribed  treatment plan for hypertension as evidenced by taking all medications as prescribed, monitoring and recording blood pressure as directed, adhering to low sodium/DASH diet  patient will demonstrate improved health management independence as evidenced by checking blood pressure as directed and notifying PCP if SBP>160 or  DBP > 90, taking all medications as prescribe, and adhering to a low sodium diet as discussed. Interventions:  Collaboration with [name], NP regarding development and update of comprehensive plan of care as evidenced by provider attestation and co-signature Inter-disciplinary care team collaboration (see longitudinal plan of care) Evaluation of current treatment plan related to hypertension self management and patient's adherence to plan as established by provider.10-24-2020: Per the patients wife the patient has been making healthier choices and not using as much salt. He also is not eating bags of potato chips. She has him eating vegetarian chips without salt. Praised for [employer]. Will continue to monitor.  Provided education to patient re: stroke prevention, s/s of heart attack and stroke, DASH diet, complications of uncontrolled blood pressure. 10-24-2020: Per wife they have not been able to follow up with the cardiologist for an ECHO. The patients wife has had issues with her own health. The patients wife states he also is going to have to have a procedure on his knee because he has an eight cm cyst on it. Will continue to monitor.  Reviewed medications with patient and discussed importance of compliance. 10-24-2020: States compliance to medications.  Discussed plans with patient for ongoing care management follow up and provided patient with direct contact information for care management team Advised patient, providing education and rationale, to monitor blood pressure daily and record, calling PCP for findings outside established parameters.  Patient  Goals/Self-Care Activities  patient will:  - UNABLE to independently manage HTN Self administers medications as prescribed Attends all scheduled provider appointments Calls provider office for new concerns, questions, or BP outside discussed parameters Checks BP and records as discussed Follows a low sodium diet/DASH diet - blood pressure trends reviewed - depression screen reviewed - home or ambulatory blood pressure monitoring encouraged Follow Up Plan: Telephone follow up appointment with care management team member scheduled for: 01-03-2021 at 1:45 pm    Task: RNCM: Identify and Monitor Blood Pressure Elevation Completed 10/24/2020  Outcome: Positive  Note:   Care Management Activities:    - blood pressure trends reviewed - depression screen reviewed - home or ambulatory blood pressure monitoring encouraged        Patient Care Plan: CCM Pharmacy Care Plan     Problem Identified: Diabetes, HTN,HLD, Asthma, GERD, NAFLD,   Priority: High     Long-Range Goal: Disease Management   Start Date: 05/22/2021  Recent Progress: Not on track  Priority: High  Note:   Current Barriers:  Unable to maintain control of DM  Pharmacist Clinical Goal(s):  Patient will verbalize ability to afford treatment regimen achieve adherence to monitoring guidelines and medication adherence to achieve therapeutic efficacy through collaboration with PharmD and provider.   Interventions: 1:1 collaboration with [name], NP regarding development and update of comprehensive plan of care as evidenced by provider attestation and co-signature Inter-disciplinary care team collaboration (see longitudinal plan of care) Comprehensive medication review performed; medication list updated in electronic medical record  Hypertension (BP goal <130/80) BP Readings from Last 3 Encounters:  08/01/21 136/78  07/26/21 136/80  07/24/21 (!) 153/77  -Uncontrolled -Current treatment: Lisinopril 26m  Appropriate, Query effective -Medications previously tried: N/A  -Current home readings:  July 2023: 10/15/21: 118/68 10/14/21: 165/65 120/75 155/75 135/65 October 2023 Didn't have readings -Current dietary habits: "Tries to eat healthy" -Current exercise habits: none -Denies hypotensive/hypertensive symptoms -Educated on BP goals and benefits of medications for prevention of heart attack, stroke and kidney damage; -Counseled  to monitor BP at home daily, document, and provide log at future appointments July 2023: BP high, let PCP know October 2023: Was at [facility] for wife so didn't have data in front of him  CAD: (LDL goal < 58) The 10-year ASCVD risk score (Arnett DK, et al., 2019) is: 11.4%   Values used to calculate the score:     Age: 61 years     Sex: Male     Is Non-Hispanic African American: No     Diabetic: Yes     Tobacco smoker: No     Systolic Blood Pressure: 161 mmHg     Is BP treated: Yes     HDL Cholesterol: 37 mg/dL     Total Cholesterol: 170 mg/dL Lab Results  Component Value Date   CHOL 170 04/26/2021   CHOL 147 01/18/2021   CHOL 207 (H) 06/01/2020   Lab Results  Component Value Date   HDL 37 (L) 04/26/2021   HDL 36 (L) 01/18/2021   HDL 27 (L) 06/01/2020   Lab Results  Component Value Date   LDLCALC 92 04/26/2021   LDLCALC 72 01/18/2021   LDLCALC 72 06/01/2020   Lab Results  Component Value Date   TRIG 240 (H) 04/26/2021   TRIG 237 (H) 01/18/2021   TRIG 694 (HH) 06/01/2020   Lab Results  Component Value Date   CHOLHDL 5.3 03/03/2019   CHOLHDL 3.9 08/04/2018   CHOLHDL 4.2 05/22/2017  No results found for: "LDLDIRECT" -Uncontrolled -CVA Hx noted - high risk. LDL goal at least <70 -Current treatment: Fenofibrate 54 mg Appropriate, query effective Atorvastatin 80 mg Appropriate, query effective Clopidogrel 15m Appropriate, Effective, Safe, Accessible ASA 879mAppropriate, Effective, Safe, Accessible -reviewed for intolerance/side effects -  no problems noted    -Educated on Cholesterol goals;  Benefits of statin for ASCVD risk reduction; -Counseled on diet and exercise extensively July 2023: Recommend Ezetimibe for LDL.   Diabetes (A1c goal <8%) Lab Results  Component Value Date   HGBA1C 8.8 (H) 07/26/2021   HGBA1C 10.2 (H) 04/26/2021   HGBA1C 8.7 (H) 01/18/2021   Lab Results  Component Value Date   MICROALBUR 10 04/26/2021   LDLCALC 92 04/26/2021   CREATININE 1.02 07/26/2021   Lab Results  Component Value Date   NA 138 07/26/2021   K 4.2 07/26/2021   CREATININE 1.02 07/26/2021   EGFR 85 07/26/2021   GFRNONAA 66 02/24/2020   GLUCOSE 322 (H) 07/26/2021   Lab Results  Component Value Date   WBC 4.8 04/26/2021   HGB 14.6 04/26/2021   HCT 43.9 04/26/2021   MCV 92 04/26/2021   PLT 174 04/26/2021  -Uncontrolled -Some issues with payment plans at current pharmacy  -NAFLD, persistent TG elevation -Current medications: Tresiba 40 units BID Appropriate, query effective Humalog 8 units before meals  Appropriate, query effective Jardiance 25 mg once daily Appropriate, query effective -Medications previously tried: stopped metformin after GFR decrease, glipizide (sulfa allergy), GLP (Pancreatitis) -Current home glucose readings June 2023 update: patient was intending to cut down on sweets, doing better with sodas.  Watching bread intake, salt  BG readings (random times) - 162, 153, 247, 184, 150 July 2023: 10/10/21: 117, 125, 125 10/11/21: 15096,045/27/23: 235, 180 10/13/21: 220, 175, 185, 165 10/14/21: 158, 178, 167 10/15/21: 193, 169 [facility]23 [facility] in [facility] -Denies hypoglycemic/hyperglycemic symptoms -Educated on A1c and blood sugar goals; Receives extra help on Rx copays - no current  issues  July 2023: Patient over-basalized (80 kg means MDD Long acting is 40 units). Will ask PCP. Metformin was stopped by Dr. Juleen China per patient but I can't find a  contraindication. Patient's GFR  is fine for Metformin. Will ask PCP about low dose since he needs extra therapy to bring him down August 2023: Metformin was trialed twice and patient had severe kidney issues both times per PCP October 2023: Had appt with Endo last month. Missed it and only went last week. Has f/u on 01/08/22 to talk about insulin pump per patient. Visiting Dietician on 01/16/22   Follow Up Plan: CPP F/U April 2024   Medication Assistance: None required.  Patient affirms current coverage meets needs.  [place] Constable, Pharm.D. - [phone]     Patient Care Plan: RNCM; General Plan of Care (Adult) for Chronic Disease Management and Care Coordination Needs  Completed 11/08/2021   Problem Identified: RNCM: Development of Care Plan for Chronic Disease Management and Care Coordination Needs (DM, HTN, HLD, Eye sty/ulcer/cataract Resolved 11/08/2021  Priority: High     Long-Range Goal: RNCM: Development of Care Plan for Chronic Disease Management and Care Coordination Needs (DM, HTN, HLD, Eye sty/ulcer/cataract Completed 11/08/2021  Start Date: 01/03/2021  Expected End Date: 01/03/2022  Priority: High  Note:   Current Barriers: 05-24-6576: Resolving, duplicate goal- see new [facility] plan of care Knowledge Deficits related to plan of care for management of HTN, HLD, DMII, and eye sty/ulcer   Care Coordination needs related to Financial constraints related to limited income, Limited social support, Transportation, and Level of care concerns  Chronic Disease Management support and education needs related to HTN, HLD, DMII, and eye sty/ulcer- right Lacks 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  attend all scheduled medical appointments: with pcp and specialist on a regular basis demonstrate improved and ongoing adherence to prescribed treatment plan for HTN, HLD, DMII, and eye sty/ulcer as evidenced by daily monitoring and recording of CBG  adherence to ADA/ carb modified diet adherence to prescribed medication regimen contacting provider for new or worsened symptoms or questions  demonstrate improved and ongoing health management independence for chronic conditions as evidence of effective management and no acute exacerbations continue to work with Consulting civil engineer and/or Social Worker to address care management and care coordination needs related to HTN, HLD, DMII, and eye sty/ulcer  demonstrate a decrease in HTN, HLD, DMII, and eye styl/ulcer exacerbations  demonstrate ongoing self health care management ability  through collaboration with RN Care manager, provider, and care team.   Interventions: 1:1 collaboration with primary care provider regarding development and update of comprehensive plan of care as evidenced by provider attestation and co-signature Inter-disciplinary care team collaboration (see longitudinal plan of care) Evaluation of current treatment plan related to  self management and patient's adherence to plan as established by provider   SDOH Barriers (Status: Goal on track: YES.)  Patient interviewed and SDOH assessment performed        SDOH Interventions    Flowsheet Row Most Recent Value  SDOH  Interventions   Food Insecurity Interventions Intervention Not Indicated  Financial Strain Interventions Other (Comment)  [continued support from care guides and resources]  Housing Interventions Intervention Not Indicated   Intimate Partner Violence Interventions Intervention Not Indicated  Physical Activity Interventions Other (Comments)  [is active but no structured activity]  Stress Interventions Intervention Not Indicated  Social Connections Interventions Intervention Not Indicated, Other (Comment)  [good support system]  Transportation Interventions [employer]     Patient interviewed and appropriate assessments performed Provided patient with information about resources available. The patient and his wife have worked with care guides before and know the process for getting help in the community for expressed needs Advised patient to call the office for changes in [place], questions or concerns, the patient states they are getting food stamps now and are thankful for the food stamps Provided education to patient/caregiver regarding level of care options.    Diabetes:  (Status: Goal on track: YES.) Lab Results  Component Value Date   HGBA1C 8.8 (H) 07/26/2021  Previous >10. 01-18-2021 8.7 Assessed patient's understanding of A1c goal: <7%. 09-13-2021: Review with the patient and the patients wife the goal of A1C.  Provided education to patient about basic DM disease process. 09-13-2021: Review with the patients wife and patient about effective management of DM and the need to have stable blood sugars.; Reviewed medications with patient and discussed importance of medication adherence. 09-13-2021: The patient states that he is taking his medications as ordered        Reviewed prescribed diet with patient heart healthy/ADA diet. 06-14-2021: The patient is doing better with dietary restrictions and denies any issues with compliance. Review of dietary restrictions. The patients wife states that he is having better readings now. 09-13-2021: The patient is doing better per his wife. He is staying away from sodas and drinking more water. He is eating vegetables from the garden and doing much better with  this.  He is still wanting to eat regular salt and his wife is working with him on this.  Counseled on importance of regular laboratory monitoring as prescribed. 09-13-2021: The patient has regular lab work         Discussed plans with patient for ongoing care management follow up and provided patient with direct contact information for care management team;      Provided patient with written educational materials related to hypo and hyperglycemia and importance of correct treatment. 09-13-2021: The patient is having higher than normal readings due to a virus in his eye. Reading this am is 265.   Reviewed scheduled/upcoming provider appointments including: 10-27-2021 at 340 pm;         Advised patient, providing education and rationale, to check cbg as directed and record. 01-03-2021: The patient checked his blood sugar while on the phone with the [facility] and it was 149. The patient states he has been in the 100's range and hardly ever over 200. Praised the patient for positive changes. Will have a new A1C coming up for follow up with the pcp on 01-18-2021. Denies any acute findings. States he is eating healthier and staying away from sodas currently.   02-22-2021: Alc trending down, In office in November the A1C was 8.7 down from 9.9.  The patients blood sugar this am was 165. The patient and patients wife encouraged to keep regular checks on blood sugars and review of goal of <130 fasting and <180 post prandial. 04-12-2021: Is checking more frequently. States he is doing better  with checking his blood sugars. Review of goals.    06-14-2021: See chart below for most recent blood sugar readings:   Blood Sugars Date AM Noon PM   06/08/2021 133  178   06/09/2021 186 162 153   06/10/2021 198 181 187   06/13/2021 158 159 196   06/14/2021 188 198   09-13-2021: The patient is having ups and downs with his blood sugars. Range is 150 to 265. Education and support given.  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 04-12-2021: The patient states that he is seeing better and they have put sensors in his eyes and he can see better. He is following with specialist and will be likely having cataract surgery. 06-14-2021: The patient is doing better and having cataract surgery on his right eye on 07-10-2021. He had cataract surgery on his left eye on 06-05-2021 and did well. Has 20/20 vision in that eye now. Goes tomorrow for reevaluation due to the eye conditions he has been dealing with. 09-13-2021: The patient has a virus in his left eye currently and is being treated for this. He goes tomorrow for follow up with the eye doctor. He is having to put drops in his eye. They are hoping that they can save his vision. Education and support given. Will continue to monitor. 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 States that they were on back order. He can see some today out of his right  eye, education and support given. 04-12-2021: The patient is seeing the specialist weekly and is taking medications as prescribed. 09-13-2021: The patient is compliant with medications.  ; Reviewed scheduled/upcoming provider appointments including appointment with eye doctor on 09-14-2021 Discussed plans with patient for ongoing care management follow up and provided patient with direct contact information for care management team;  Hyperlipidemia:  (Status: Goal on Track (progressing): YES.) Lab Results  Component Value Date   CHOL 170 04/26/2021   HDL 37 (L) 04/26/2021   LDLCALC 92 04/26/2021   TRIG 240 (H) 04/26/2021   CHOLHDL 5.3 03/03/2019     Medication review performed; medication list updated in electronic medical record. Atorvastatin 80 mg QD Provider established cholesterol goals reviewed. 09-13-2021: Education and support given; Counseled on importance of regular laboratory monitoring as prescribed; Provided HLD educational materials; Reviewed role and benefits of statin for ASCVD risk reduction; Discussed strategies to manage statin-induced myalgias; Reviewed importance of limiting foods  high in cholesterol. 09-13-2021: Education provided on Heart Healthy/ADA diet. Extensive review and education given.  Reviewed exercise goals and target of 150 minutes per week;  Hypertension: (Status: Goal on track: YES.) Last practice recorded BP readings:  BP Readings from Last 3 Encounters:  08/01/21 136/78  07/26/21 136/80  07/24/21 (!) 153/77  Most recent eGFR/CrCl:  Lab Results  Component Value Date   EGFR 85 07/26/2021    No components found for: CRCL  Evaluation of current treatment plan related to hypertension self management and patient's adherence to plan as established by provider. 04-12-2021: The patient is doing well and has no question related to heart health. Will reschedule follow up with specialist to have an ECHO. Missed appointment due to having issues with his eye.  Denies any acute findings. 06-14-2021: The patient is seeing the heart specialist. The patient is doing well and maintaining his health and well being. 09-13-2021: The patient is doing well with heart health. Sees specialist on a regular basis. HTN stable.;   Provided education to patient re: stroke prevention, s/s of heart attack and stroke; Reviewed prescribed diet heart healthy/ADA. 09-13-2021: Reviewed. Per the patients wife he is still using a lot of salt in his diet. The patients wife is trying to get him to cut back on his consumption of salt. Education and support given. Reviewed medications with patient and discussed importance of compliance. 09-13-2021: States compliance with medications, wife assist with medications management. Per the patient he is supposed to stop his blood thinners. Discussed with the wife the need to talk to the specialist and cardiologist before stopping blood thinners due to past history of stroke. Advised that usually when a patient has a procedure they sometimes need to stop certain medications for the procedure. Extensive review on making sure they have a clear understanding before stopping the ASA or plavix. Teach back method used to make sure the patients wife understood instructions given. Ask the patient wife to write down questions to ask when they call to schedule the injections for the patient to have in his right hip.  Counseled on adverse effects of illicit drug and excessive alcohol use in patients with high blood pressure;  Counseled on the importance of exercise goals with target of 150 minutes per week Discussed plans with patient for ongoing care management follow up and provided patient with direct contact information for care management team; Advised patient, providing education and rationale, to monitor blood pressure daily and record, calling PCP for findings outside established parameters;  Reviewed scheduled/upcoming provider appointments  including:10-27-2021 at 340 pm   Pain:  (Status: New goal.) Long Term Goal added on 09-13-2021: The patient is having right hip pain- new onset Pain assessment performed. 09-13-2021: The patient is complaining of right hip pain rating the pain at a 10 today on a scale of 0-10 Medications reviewed. 09-13-2021: The patient is waiting to hear from general surgery about a procedure they are going to do to help his hip pain. Extensive review with the patient about following the instructions from the provider Reviewed provider established plan for pain management. 09-13-2021: Per the patients wife they are going to try a procedure to see if that will help with the pain and discomfort the patient is experiencing in his right hip. If that does not work they will likely do surgery; Discussed importance of adherence to all scheduled medical appointments. Sees pcp on 10-27-2021 will have procedure on hip when office calls to set this up Counseled on  the importance of reporting any/all new or changed pain symptoms or management strategies to pain management provider; Advised patient to report to care team affect of pain on daily activities; Discussed use of relaxation techniques and/or diversional activities to assist with pain reduction (distraction, imagery, relaxation, massage, acupressure, TENS, heat, and cold application; Reviewed with patient prescribed pharmacological and nonpharmacological pain relief strategies; Advised patient to discuss uncontrolled pain, changes in level or intensity of pain with provider; Screening for signs and symptoms of depression related to chronic disease state;  Assessed social determinant of health barriers;    Patient Goals/Self-Care Activities: Patient will self administer medications as prescribed as evidenced by self report/primary caregiver report  Patient will attend all scheduled provider appointments as evidenced by clinician review of documented attendance to scheduled  appointments and patient/caregiver report Patient will call pharmacy for medication refills as evidenced by patient report and review of pharmacy fill history as appropriate Patient will attend church or other social activities as evidenced by patient report Patient will continue to perform ADL's independently as evidenced by patient/caregiver report Patient will continue to perform IADL's independently as evidenced by patient/caregiver report Patient will call provider office for new concerns or questions as evidenced by review of documented incoming telephone call notes and patient report Patient will work with BSW to address care coordination needs and will continue to work with the clinical team to address health care and disease management related needs as evidenced by documented adherence to scheduled care management/care coordination appointments - keep appointment with eye doctor - check blood sugar at prescribed times: before meals and at bedtime, when you have symptoms of low or high blood sugar, and before and after exercise - check feet daily for cuts, sores or redness - enter blood sugar readings and medication or insulin into daily log - take the blood sugar log to all doctor visits - trim toenails straight across - drink 6 to 8 glasses of water each day - eat fish at least once per week - fill half of plate with vegetables - limit fast food meals to no more than 1 per week - manage portion size - prepare main meal at home 3 to 5 days each week - read food labels for fat, fiber, carbohydrates and portion size - reduce red meat to 2 to 3 times a week - keep feet up while sitting - wash and dry feet carefully every day - wear comfortable, cotton socks - wear comfortable, well-fitting shoes - check blood pressure weekly - choose a place to take my blood pressure (home, clinic or office, retail store) - write blood pressure results in a log or diary - learn about high blood  pressure - keep a blood pressure log - take blood pressure log to all doctor appointments - call doctor for signs and symptoms of high blood pressure - keep all doctor appointments - take medications for blood pressure exactly as prescribed - report new symptoms to your doctor - eat more whole grains, fruits and vegetables, lean meats and healthy fats - call for medicine refill 2 or 3 days before it runs out - take all medications exactly as prescribed - call doctor with any symptoms you believe are related to your medicine - call doctor when you experience any new symptoms - go to all doctor appointments as scheduled - adhere to prescribed diet: heart healthy/ADA diet        Mr. Capers was given information about Chronic Care Management services today  including:  CCM service includes personalized support from designated clinical staff supervised by his physician, including individualized plan of care and coordination with other care providers 24/7 contact phone numbers for assistance for urgent and routine care needs. Standard insurance, coinsurance, copays and deductibles apply for chronic care management only during months in which we provide at least 20 minutes of these services. Most insurances cover these services at 100%, however patients may be responsible for any copay, coinsurance and/or deductible if applicable. This service may help you avoid the need for more expensive face-to-face services. Only one practitioner may furnish and bill the service in a calendar month. The patient may stop CCM services at any time (effective at the end of the month) by phone call to the office staff.  Patient agreed to services and verbal consent obtained.   The patient verbalized understanding of instructions, educational materials, and care plan provided today and DECLINED offer to receive copy of patient instructions, educational materials, and care plan.  The pharmacy team will reach out  to the patient again over the next 60 days.   Lane Hacker, [place]

## 2021-12-25 NOTE — Patient Outreach (Addendum)
  Care Coordination   Initial Visit Note   12/25/2021 Name: [name]. MRN: [id number] DOB: 03/14/1961  Alex Metro. is a 61 y.o. year old male who sees [place], Henrine Screws T, NP for primary care. I spoke with  Alex Metro. 's spouse by phone today.  What matters to the patients health and wellness today?  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 to 255.00 to 450.00 -started in [place] there 24 years Per patient's spouse, their income is limited, with patient's income going towards the household bills Patient's spouse reports that they may have to consider moving at some point, but are hoping they do not have to move Patient's spouse confirmed that they do receive food stamps and they use local food banks regularly for food and clothes Brief review of finances completed to identify opportunities to cut down-no options identified at this time Patient also plans to apply for charity care to assist with patient's medical bills Spouse confirms that patient has applied for Medicaid but is not eligible Motivational Interviewing employed Depression screen reviewed  Solution-Focused Strategies employed:  Active listening / Reflection utilized  Emotional Support Provided         SDOH assessments and interventions completed:  Yes  SDOH Interventions Today    Flowsheet Row Most Recent Value  SDOH Interventions   Food Insecurity Interventions Other (Comment)  [currenlty has resources for area food banks]  Housing Interventions Intervention Not Indicated  Transportation Interventions Intervention Not Indicated  Utilities Interventions Intervention Not Indicated  Financial Strain Interventions Other (Comment)  [plan to apply  for charity care for hospital bills]        Care Coordination Interventions Activated:  Yes  Care Coordination Interventions:  Yes, provided   Follow up plan: No further intervention required.   Encounter Outcome:  Pt. Visit Completed

## 2021-12-25 NOTE — Patient Instructions (Addendum)
Visit Information  Thank you for taking time to visit with me today. Please don't hesitate to contact me if I can be of assistance to you.   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 -started in [place] there 24 years Per patient's spouse, their income is limited, with patient's income going towards the household bills Patient's spouse reports that they may have to consider moving at some point, but are hoping they do not have to move Patient's spouse confirmed that they do receive food stamps and they use local food banks regularly for food and clothes Brief review of finances completed to identify opportunities to cut down-no options identified at this time Patient also plans to apply for charity care to assist with patient's medical bills Spouse confirms that patient has applied for Medicaid but is not eligible Motivational Interviewing employed Depression screen reviewed  Solution-Focused Strategies employed:  Active listening / Reflection utilized  Emotional Support Provided        If you are experiencing a Mental Health or [place] or need someone to talk to, please call the Suicide and Crisis Lifeline: 988 call 911   Patient verbalizes understanding of instructions and care plan provided today and agrees to view in [place]. Active MyChart status and patient understanding of how to access instructions and care plan via MyChart confirmed with patient.     No further follow up required: patient to call this Education officer, museum with any additional community resource needs  [employer], JAARS Worker  [facility] [facility] [place] [facility] [phone]

## 2021-12-25 NOTE — Progress Notes (Signed)
Chronic Care Management Pharmacy Note  12/25/2021 Name:  [name]. MRN:  [id number] DOB:  August 28, 1960  Summary: -Very pleasant patient and his wife ([name]) who manages all his healthcare. She loves animals and even saved an animal while visiting the zoo one time!  Recommendation to PCP: -Has f/u with Endo for next week. Missed appt last month -Patient's rent at [place] is increasing 200% ($250->$500). Coordinated with Herbie Baltimore for ideas   Subjective: [name]. is an 61 y.o. year old male who is a primary patient of Cannady, Barbaraann Faster, NP.  The CCM team was consulted for assistance with disease management and care coordination needs.    Engaged with patient by telephone for follow up visit in response to provider referral for pharmacy case management and/or care coordination services.   Consent to Services:  The patient was given information about Chronic Care Management services, agreed to services, and gave verbal consent prior to initiation of services.  Please see initial visit note for detailed documentation.   Patient Care Team: [name], NP as PCP - General (Nurse Practitioner) Anell Barr, OD (Optometry) Dionisio David, MD as Consulting Physician (Cardiology) Vanita Ingles, RN as Case Manager ([place]) Lane Hacker, [facility] (Pharmacist)  Hospital visits: None in previous 6 months  Objective:  Lab Results  Component Value Date   CREATININE 1.27 10/27/2021   CREATININE 1.02 07/26/2021   CREATININE 1.44 (H) 04/26/2021    Lab Results  Component Value Date   HGBA1C 10.0 (H) 10/27/2021   Last diabetic Eye exam:  Lab Results  Component Value Date/Time   HMDIABEYEEXA No Retinopathy 05/25/2021 12:00 AM    Last diabetic Foot exam: No results found for: "HMDIABFOOTEX"      Component Value Date/Time   CHOL 250 (H) 10/27/2021 1606   CHOL 136 12/02/2018 1408   TRIG 487 (H) 10/27/2021 1606   TRIG 145  12/02/2018 1408   HDL 35 (L) 10/27/2021 1606   CHOLHDL 5.3 03/03/2019 0447   VLDL 44 (H) 03/03/2019 0447   VLDL 229 (H) 12/02/2018 1408   LDLCALC 127 (H) 10/27/2021 1606       Latest Ref Rng & Units 10/27/2021    4:06 PM 04/26/2021    2:25 PM 01/18/2021    2:31 PM  Hepatic Function  Total Protein 6.0 - 8.5 g/dL 6.6  7.1  6.6   Albumin 3.8 - 4.9 g/dL 4.3  4.8  4.4   AST 0 - 40 IU/L _0 ALT 0 - 44 IU/L 26  36  27   Alk Phosphatase 44 - 121 IU/L 152  148  144   Total Bilirubin 0.0 - 1.2 mg/dL 0.3  0.5  0.3     Lab Results  Component Value Date/Time   TSH 1.130 04/26/2021 02:25 PM   TSH 0.881 06/01/2020 03:16 PM       Latest Ref Rng & Units 04/26/2021    2:25 PM 01/18/2021    2:31 PM 05/23/2020    4:15 PM  CBC  WBC 3.4 - 10.8 x10E3/uL 4.8  5.0  5.6   Hemoglobin 13.0 - 17.7 g/dL 14.6  13.4  13.7   Hematocrit 37.5 - 51.0 % 43.9  42.6  42.6   Platelets 150 - 450 x10E3/uL 174  176  196     Lab Results  Component Value Date/Time   VD25OH 21.1 (L) 10/27/2021 04:06 PM  VD25OH 22.5 (L) 04/26/2021 02:25 PM    Clinical ASCVD:  The 10-year ASCVD risk score (Arnett DK, et al., 2019) is: 27.2%   Values used to calculate the score:     Age: 61 years     Sex: Male     Is Non-Hispanic African American: No     Diabetic: Yes     Tobacco smoker: No     Systolic Blood Pressure: 937 mmHg     Is BP treated: Yes     HDL Cholesterol: 35 mg/dL     Total Cholesterol: 250 mg/dL     Social History   Tobacco Use  Smoking Status Never  Smokeless Tobacco Never   BP Readings from Last 3 Encounters:  11/01/21 135/77  10/27/21 106/66  08/01/21 136/78   Pulse Readings from Last 3 Encounters:  11/01/21 (!) 54  10/27/21 64  08/01/21 (!) 51   Wt Readings from Last 3 Encounters:  11/01/21 174 lb 9.6 oz (79.2 kg)  10/27/21 174 lb 1.6 oz (79 kg)  08/01/21 176 lb (79.8 kg)    Assessment: Review of patient past medical history, allergies, medications, health status, including  review of consultants reports, laboratory and other test data, was performed as part of comprehensive evaluation and provision of chronic care management services.   SDOH:  (Social Determinants of Health) assessments and interventions performed: Yes SDOH Interventions    Flowsheet Row Chronic Care Management from 12/25/2021 in [facility] Telephone from 11/22/2021 in [place] from 11/08/2021 in [place] Management from 10/16/2021 in [place] Management from 08/28/2021 in [place] from 05/03/2021 in [place] Interventions        Food Insecurity Interventions -- -- Intervention Not Indicated -- Intervention Not Indicated Intervention Not Indicated  Housing Interventions -- -- Intervention Not Indicated -- -- Intervention Not Indicated  Transportation Interventions Intervention Not Indicated Other (Comment)  [Patient has transportation benefit with [employer]. He stated that he is able to drive his car and does not need transportation at this time.] Intervention Not Indicated Intervention Not Indicated Intervention Not Indicated Intervention Not Indicated  Depression Interventions/Treatment  -- -- -- -- -- Currently on Treatment  Financial Strain Interventions Other (Comment)  [Coordinate with SW] -- -- Other (Comment)  [let Education officer, museum know] -- Intervention Not Indicated  Physical Activity Interventions -- -- -- -- -- Intervention Not Indicated  Stress Interventions -- -- Other (Comment)  [the patient was upset this week at the eye doctor, doing better today] -- -- Intervention Not Indicated  Social Connections Interventions -- -- -- -- -- Intervention Not Indicated      Care Gaps - none noted Start Drugs - no gaps noted w/ statin, on insulin   CCM Care Plan  Allergies  Allergen Reactions   Bee Venom Hives     All kinds of bees   Other     Certain powders   Sulfa Antibiotics     Medications Reviewed Today     Reviewed by Lane Hacker, [facility] (Pharmacist) on 12/25/21 at 1212  Med List Status: <None>   Medication Order Taking? Sig Documenting Provider Last Dose Status Informant  aspirin 81 MG EC tablet [id number] No Take 1 tablet (81 mg total) by mouth daily. Marnee Guarneri T, NP Taking Active   Blood Glucose Monitoring Suppl (ONE TOUCH ULTRA 2) w/Device KIT [id number] No Use to check  blood sugar 4 times a day Cannady, Henrine Screws T, NP Taking Active   BREO ELLIPTA 100-25 MCG/ACT AEPB [id number]  INHALE 1 PUFF BY MOUTH ONCE DAILY Cannady, Jolene T, NP  Active   Cholecalciferol 1.25 MG (50000 UT) TABS [id number] No Take 1 tablet by mouth once a week. Marnee Guarneri T, NP Taking Active   clopidogrel (PLAVIX) 75 MG tablet [id number] No Take 1 tablet by mouth daily. [provider] Taking Active   EPINEPHRINE 0.3 mg/0.3 mL IJ SOAJ injection [id number] No INJECT 1 SYRINGE INTO OUTER THIGH ONCE AS NEEDED FOR SEVERE ALLERGIC REACTION. Marnee Guarneri T, NP Taking Active   fenofibrate 54 MG tablet [id number]  TAKE 1 TABLET BY MOUTH ONCE DAILY Cannady, Jolene T, NP  Active   gabapentin (NEURONTIN) 300 MG capsule [id number] No TAKE 1 CAPSULE BY MOUTH TWICE DAILY Cannady, Jolene T, NP Taking Active   glucose blood (ONETOUCH ULTRA) test strip [id number] No 1 each by Other route 5 (five) times daily. Marnee Guarneri T, NP Taking Active   insulin degludec (TRESIBA FLEXTOUCH) 100 UNIT/ML FlexTouch Pen [id number] No Take 45 units into the skin in morning and 45 units into skin every evening. Marnee Guarneri T, NP Taking Active   insulin lispro (HUMALOG KWIKPEN) 100 UNIT/ML KwikPen [id number] No Inject 8 Units into the skin 3 (three) times daily. Prior to breakfast, lunch, and dinner. Marnee Guarneri T, NP Taking Active   Insulin Pen Needle (PEN NEEDLES) 32G X 4 MM MISC [id number] No 1 kit by Does not apply route  daily. Marnee Guarneri T, NP Taking Active   JARDIANCE 25 MG TABS tablet [id number] No TAKE 1 TABLET BY MOUTH ONCE DAILY [name], NP Taking Active   Lancets [facility] DELICA PLUS [id number]) MISC [id number] No USE AS DIRECTED. Marnee Guarneri T, NP Taking Active   lisinopril (ZESTRIL) 10 MG tablet [id number] No Take 1 tablet (10 mg total) by mouth daily. [name], NP Taking Active   Misc. Devices (PULSE OXIMETER FOR FINGER) MISC [id number] No To check O2 saturations once daily with asthma and document + check if any shortness of breath Cannady, Jolene T, NP Taking Active   montelukast (SINGULAIR) 10 MG tablet [id number]  TAKE 1 TABLET BY MOUTH AT BEDTIME Cannady, Jolene T, NP  Active   moxifloxacin (VIGAMOX) 0.5 % ophthalmic solution [id number] No Apply to eye. [provider] Taking Active   nitroGLYCERIN (NITROSTAT) 0.4 MG SL tablet [id number] No Place 1 tablet (0.4 mg total) under the tongue every 5 (five) minutes as needed for chest pain. Birdie Sons, MD Taking Active   omeprazole (PRILOSEC) 20 MG capsule [id number] No TAKE 1 CAPSULE BY MOUTH ONCE DAILY Cannady, Jolene T, NP Taking Active   rosuvastatin (CRESTOR) 40 MG tablet [id number] No Take 1 tablet (40 mg total) by mouth daily. Marnee Guarneri T, NP Taking Active   sitaGLIPtin (JANUVIA) 100 MG tablet [id number] No Take 1 tablet (100 mg total) by mouth daily. Marnee Guarneri T, NP Taking Active   tiZANidine (ZANAFLEX) 2 MG tablet [id number] No Take 1 tablet (2 mg total) by mouth every 6 (six) hours as needed for muscle spasms. Marnee Guarneri T, NP Taking Active   traMADol (ULTRAM) 50 MG tablet [id number] No Take 1 tablet every 6 hours by oral route. [provider] Taking Active   triamcinolone cream (KENALOG) 0.1 % [id number] No Apply 1 application. topically 2 (two) times daily. [name], NP Taking Active   trimethoprim-polymyxin b (POLYTRIM) ophthalmic solution [id number]  No Place 1 drop into the right  eye every 6 (six) hours.  [provider] Taking Active   ULTRACARE PEN NEEDLES 32G X 5 MM MISC [id number] No  [provider] Taking Active   valACYclovir (VALTREX) 1000 MG tablet [id number] No Take 1,000 mg by mouth 3 (three) times daily. [provider] Taking Active   valACYclovir (VALTREX) 500 MG tablet [id number] No Take by mouth. [provider] Taking Active   vitamin B-12 (CYANOCOBALAMIN) 1000 MCG tablet [id number] No Take 1 tablet (1,000 mcg total) by mouth daily. [name], NP Taking Active             Patient Active Problem List   Diagnosis Date Noted   Right hip pain 05/24/2021   B12 deficiency 04/26/2021   Heart murmur 09/21/2020   Type 2 diabetes mellitus with proteinuria ([place]) 03/25/2019   Atherosclerosis of both carotid arteries 03/22/2019   History of CVA (cerebrovascular accident) 03/06/2019   Uncontrolled type 2 diabetes mellitus with hyperglycemia ([place])    Obesity 09/03/2018   Frequent falls 05/15/2016   Vitamin D deficiency 08/22/2015   Carpal tunnel syndrome on left 05/12/2015   External hemorrhoids 12/20/2014   Allergic rhinitis 10/21/2014   Diverticulosis 10/21/2014   Hypertension associated with diabetes ([place]) 10/21/2014   Hyperlipidemia associated with type 2 diabetes mellitus ([place]) 10/21/2014   Asthma 10/21/2014   GERD (gastroesophageal reflux disease) 10/21/2014   NAFLD (nonalcoholic fatty liver disease) 10/21/2014   Arthritis 10/21/2014   History of pancreatitis 10/07/2014   Phenylketonuria (PKU) ([place]) 10/07/2014   Renal cyst, left 10/07/2014   Gall bladder polyp 10/01/2013    Immunization History  Administered Date(s) Administered   Hepatitis A, Adult 09/26/2016, 05/22/2017   Hepatitis B, adult 09/26/2016, 05/22/2017, 11/29/2017   Influenza,inj,Quad PF,6+ Mos 12/22/2012, 12/21/2013, 11/29/2017, 12/02/2018, 01/18/2021   Influenza-Unspecified 11/30/2014, 11/18/2015   Moderna Sars-Covid-2 Vaccination  11/16/2019, 12/14/2019, 12/14/2019   Pneumococcal Polysaccharide-23 12/22/2012, 11/18/2015   Td 01/18/2021    Conditions to be addressed/monitored: HTN, HLD, and DMII  Care Plan : [place]  Updates made by Lane Hacker, [place] since 12/25/2021 12:00 AM     Problem: Diabetes, HTN,HLD, Asthma, GERD, NAFLD,   Priority: High     Long-Range Goal: Disease Management   Start Date: 05/22/2021  Recent Progress: Not on track  Priority: High  Note:   Current Barriers:  Unable to maintain control of DM  Pharmacist Clinical Goal(s):  Patient will verbalize ability to afford treatment regimen achieve adherence to monitoring guidelines and medication adherence to achieve therapeutic efficacy through collaboration with PharmD and provider.   Interventions: 1:1 collaboration with [name], NP regarding development and update of comprehensive plan of care as evidenced by provider attestation and co-signature Inter-disciplinary care team collaboration (see longitudinal plan of care) Comprehensive medication review performed; medication list updated in electronic medical record  Hypertension (BP goal <130/80) BP Readings from Last 3 Encounters:  08/01/21 136/78  07/26/21 136/80  07/24/21 (!) 153/77  -Uncontrolled -Current treatment: Lisinopril 17m Appropriate, Query effective -Medications previously tried: N/A  -Current home readings:  July 2023: 10/15/21: 118/68 10/14/21: 165/65 120/75 155/75 135/65 October 2023 Didn't have readings -Current dietary habits: "Tries to eat healthy" -Current exercise habits: none -Denies hypotensive/hypertensive symptoms -Educated on BP goals and benefits of medications for prevention of heart attack, stroke and kidney damage; -Counseled to monitor BP at home daily, document, and provide log at future appointments July 2023: BP high, let PCP know October  2023: Was at [facility] for wife so didn't have data in front of him  CAD:  (LDL goal < 46) The 10-year ASCVD risk score (Arnett DK, et al., 2019) is: 11.4%   Values used to calculate the score:     Age: 39 years     Sex: Male     Is Non-Hispanic African American: No     Diabetic: Yes     Tobacco smoker: No     Systolic Blood Pressure: 643 mmHg     Is BP treated: Yes     HDL Cholesterol: 37 mg/dL     Total Cholesterol: 170 mg/dL Lab Results  Component Value Date   CHOL 170 04/26/2021   CHOL 147 01/18/2021   CHOL 207 (H) 06/01/2020   Lab Results  Component Value Date   HDL 37 (L) 04/26/2021   HDL 36 (L) 01/18/2021   HDL 27 (L) 06/01/2020   Lab Results  Component Value Date   LDLCALC 92 04/26/2021   LDLCALC 72 01/18/2021   LDLCALC 72 06/01/2020   Lab Results  Component Value Date   TRIG 240 (H) 04/26/2021   TRIG 237 (H) 01/18/2021   TRIG 694 (HH) 06/01/2020   Lab Results  Component Value Date   CHOLHDL 5.3 03/03/2019   CHOLHDL 3.9 08/04/2018   CHOLHDL 4.2 05/22/2017  No results found for: "LDLDIRECT" -Uncontrolled -CVA Hx noted - high risk. LDL goal at least <70 -Current treatment: Fenofibrate 54 mg Appropriate, query effective Atorvastatin 80 mg Appropriate, query effective Clopidogrel 82m Appropriate, Effective, Safe, Accessible ASA 832mAppropriate, Effective, Safe, Accessible -reviewed for intolerance/side effects - no problems noted    -Educated on Cholesterol goals;  Benefits of statin for ASCVD risk reduction; -Counseled on diet and exercise extensively July 2023: Recommend Ezetimibe for LDL.   Diabetes (A1c goal <8%) Lab Results  Component Value Date   HGBA1C 8.8 (H) 07/26/2021   HGBA1C 10.2 (H) 04/26/2021   HGBA1C 8.7 (H) 01/18/2021   Lab Results  Component Value Date   MICROALBUR 10 04/26/2021   LDLCALC 92 04/26/2021   CREATININE 1.02 07/26/2021   Lab Results  Component Value Date   NA 138 07/26/2021   K 4.2 07/26/2021   CREATININE 1.02 07/26/2021   EGFR 85 07/26/2021   GFRNONAA 66 02/24/2020   GLUCOSE 322  (H) 07/26/2021   Lab Results  Component Value Date   WBC 4.8 04/26/2021   HGB 14.6 04/26/2021   HCT 43.9 04/26/2021   MCV 92 04/26/2021   PLT 174 04/26/2021  -Uncontrolled -Some issues with payment plans at current pharmacy  -NAFLD, persistent TG elevation -Current medications: Tresiba 40 units BID Appropriate, query effective Humalog 8 units before meals  Appropriate, query effective Jardiance 25 mg once daily Appropriate, query effective -Medications previously tried: stopped metformin after GFR decrease, glipizide (sulfa allergy), GLP (Pancreatitis) -Current home glucose readings June 2023 update: patient was intending to cut down on sweets, doing better with sodas.  Watching bread intake, salt  BG readings (random times) - 162, 153, 247, 184, 150 July 2023: 10/10/21: 117, 125, 125 10/11/21: 15329,518/27/23: 235, 180 10/13/21: 220, 175, 185, 165 10/14/21: 158, 178, 167 10/15/21: 193, 169 [facility]23 [facility] -Denies hypoglycemic/hyperglycemic symptoms -Educated on A1c and blood sugar goals; Receives extra help on Rx copays - no current issues  July 2023: Patient over-basalized (80 kg means MDD Long acting is 40 units). Will ask PCP. Metformin was  stopped by Dr. Juleen China per patient but I can't find a contraindication. Patient's GFR  is fine for Metformin. Will ask PCP about low dose since he needs extra therapy to bring him down August 2023: Metformin was trialed twice and patient had severe kidney issues both times per PCP October 2023: Had appt with Endo last month. Missed it and only went last week. Has f/u on 01/08/22 to talk about insulin pump per patient. Visiting Dietician on 01/16/22   Follow Up Plan: CPP F/U April 2024   Medication Assistance: None required.  Patient affirms current coverage meets needs.  [place] Constable, Pharm.D. - [phone]       Patient's preferred pharmacy is:  [facility], [address] [place] [address] Phone: [phone] Fax: [phone] -They allow him to pay on credit, patient having difficulty affording meds some month  Pt endorses 100% compliance  Follow Up:  Patient agrees to Care Plan and Follow-up.  Plan:   Future Appointments  Date Time Provider [place]  01/16/2022 10:15 AM Shotwell, Guadalupe Maple, RN [facility] None  01/30/2022  2:40 PM Marnee Guarneri T, NP [facility]  02/01/2022  1:00 PM Vanita Ingles, RN [facility] None  04/23/2022  2:00 PM Lin Landsman, MD [facility] None  05/14/2022 10:00 AM CFP CCM PHARMACY [facility]  11/01/2022  1:00 PM [facility] VASC 2 [facility] None  11/01/2022  2:00 PM Kris Hartmann, NP [facility] None    [place] Constable, Pharm.D. - [phone]

## 2021-12-27 DIAGNOSIS — E1165 Type 2 diabetes mellitus with hyperglycemia: Secondary | ICD-10-CM | POA: Diagnosis not present

## 2021-12-28 ENCOUNTER — Ambulatory Visit: Payer: Medicare Other

## 2022-01-01 ENCOUNTER — Telehealth: Payer: Self-pay

## 2022-01-01 DIAGNOSIS — R1084 Generalized abdominal pain: Secondary | ICD-10-CM

## 2022-01-01 DIAGNOSIS — M1611 Unilateral primary osteoarthritis, right hip: Secondary | ICD-10-CM | POA: Diagnosis not present

## 2022-01-01 NOTE — Telephone Encounter (Signed)
Recommend right upper quadrant ultrasound.  He also has poorly controlled diabetes, recommend gastric emptying study You can put him on urgent waitlist  Thanks RV

## 2022-01-01 NOTE — Telephone Encounter (Signed)
Got patient schedule for gastric emptying study and ultrasound for 01/12/2022 arrive at 8:00am for a 8:30am scan. Nothing to eat or drink after midnight and no omeprazole after midnight Informed patient and patient verbalized understanding of instructions

## 2022-01-01 NOTE — Telephone Encounter (Signed)
Patient is calling because he saw a provider today about getting a shot in his hip. When the doctor pressed on his stomach he had a sharp pain where his liver is. He states that the pain is constant In the center of his stomach. The doctor told him the pain is coming from his liver and not his hip so he did not give him a shot. Denies any vomiting diarrhea or constipation. He made appointment to see you in February but wants to know if he can see you sooner or what you recommend

## 2022-01-01 NOTE — Addendum Note (Signed)
Addended by: Ulyess Blossom L on: 01/01/2022 01:37 PM   Modules accepted: Orders

## 2022-01-08 DIAGNOSIS — I10 Essential (primary) hypertension: Secondary | ICD-10-CM | POA: Diagnosis not present

## 2022-01-08 DIAGNOSIS — E0840 Diabetes mellitus due to underlying condition with diabetic neuropathy, unspecified: Secondary | ICD-10-CM | POA: Diagnosis not present

## 2022-01-08 DIAGNOSIS — E7849 Other hyperlipidemia: Secondary | ICD-10-CM | POA: Diagnosis not present

## 2022-01-08 DIAGNOSIS — E1165 Type 2 diabetes mellitus with hyperglycemia: Secondary | ICD-10-CM | POA: Diagnosis not present

## 2022-01-08 DIAGNOSIS — N183 Chronic kidney disease, stage 3 unspecified: Secondary | ICD-10-CM | POA: Diagnosis not present

## 2022-01-09 DIAGNOSIS — H179 Unspecified corneal scar and opacity: Secondary | ICD-10-CM | POA: Diagnosis not present

## 2022-01-10 NOTE — Telephone Encounter (Signed)
Pt left message returning call  and would like a call back

## 2022-01-10 NOTE — Telephone Encounter (Signed)
Called and left a message for call back

## 2022-01-11 ENCOUNTER — Other Ambulatory Visit: Payer: Self-pay | Admitting: Nurse Practitioner

## 2022-01-11 MED ORDER — ONETOUCH DELICA PLUS LANCET33G MISC: 3 refills | Status: DC

## 2022-01-11 NOTE — Telephone Encounter (Signed)
Pt would like to get a 90 day supply of Lancets (ONETOUCH DELICA PLUS [id number]) [place]  He is only getting 1 box at a time. Would like 3 boxes at a time.  Pt checks his sugar 3 times a day, but when his sugar runs high, he may test more than that, so one box may not be enough at some times.  But he said he can make due if he can at least get 3.  [place], [place]

## 2022-01-11 NOTE — Telephone Encounter (Signed)
Requested Prescriptions  Pending Prescriptions Disp Refills  . Lancets (ONETOUCH DELICA PLUS [id number]) MISC 300 each 3    Sig: USE AS DIRECTED.     Endocrinology: Diabetes - Testing Supplies Passed - 01/11/2022  3:17 PM      Passed - Valid encounter within last 12 months    Recent Outpatient Visits          2 months ago Uncontrolled type 2 diabetes mellitus with hyperglycemia ([place])   [place], Jolene T, NP   5 months ago NAFLD (nonalcoholic fatty liver disease)   [place] Cannady, Jolene T, NP   5 months ago Uncontrolled type 2 diabetes mellitus with hyperglycemia ([place])   [place], Jolene T, NP   7 months ago Type 2 diabetes mellitus with proteinuria ([place])   [place], Jolene T, NP   8 months ago Uncontrolled type 2 diabetes mellitus with hyperglycemia ([place])   [place], Barbaraann Faster, NP      Future Appointments            In 2 weeks Cannady, Barbaraann Faster, NP [employer], [facility]   In 3 months Vanga, Tally Due, MD [place]

## 2022-01-12 ENCOUNTER — Ambulatory Visit
Admission: RE | Admit: 2022-01-12 | Discharge: 2022-01-12 | Disposition: A | Discharge status: Home / Self Care | Payer: Medicare Other | Source: Ambulatory Visit | Attending: Gastroenterology | Admitting: Gastroenterology

## 2022-01-12 ENCOUNTER — Encounter
Admission: RE | Admit: 2022-01-12 | Discharge: 2022-01-12 | Disposition: A | Discharge status: Home / Self Care | Payer: Medicare Other | Source: Ambulatory Visit | Attending: Gastroenterology | Admitting: Gastroenterology

## 2022-01-12 DIAGNOSIS — R1084 Generalized abdominal pain: Secondary | ICD-10-CM | POA: Insufficient documentation

## 2022-01-12 DIAGNOSIS — Z0389 Encounter for observation for other suspected diseases and conditions ruled out: Secondary | ICD-10-CM | POA: Diagnosis not present

## 2022-01-12 DIAGNOSIS — K8020 Calculus of gallbladder without cholecystitis without obstruction: Secondary | ICD-10-CM | POA: Diagnosis not present

## 2022-01-12 DIAGNOSIS — R109 Unspecified abdominal pain: Secondary | ICD-10-CM | POA: Diagnosis not present

## 2022-01-12 MED ORDER — TECHNETIUM TC 99M SULFUR COLLOID
2.0000 | Freq: Once | INTRAVENOUS | Status: AC | PRN
  Administered 2022-01-12: 2.11 via ORAL

## 2022-01-16 ENCOUNTER — Telehealth: Payer: Self-pay

## 2022-01-16 ENCOUNTER — Ambulatory Visit: Payer: Medicare Other | Admitting: *Deleted

## 2022-01-16 DIAGNOSIS — R1084 Generalized abdominal pain: Secondary | ICD-10-CM

## 2022-01-16 DIAGNOSIS — I10 Essential (primary) hypertension: Secondary | ICD-10-CM

## 2022-01-16 DIAGNOSIS — Z794 Long term (current) use of insulin: Secondary | ICD-10-CM | POA: Diagnosis not present

## 2022-01-16 DIAGNOSIS — E1159 Type 2 diabetes mellitus with other circulatory complications: Secondary | ICD-10-CM

## 2022-01-16 DIAGNOSIS — I2510 Atherosclerotic heart disease of native coronary artery without angina pectoris: Secondary | ICD-10-CM

## 2022-01-16 NOTE — Telephone Encounter (Signed)
Patient wife left a voicemail to get the results of the patient Gastric emptying study and ultrasound

## 2022-01-17 NOTE — Addendum Note (Signed)
Addended by: Ulyess Blossom L on: 01/17/2022 11:09 AM   Modules accepted: Orders

## 2022-01-17 NOTE — Telephone Encounter (Signed)
Patient and his wife verbalized understanding of instructions of results. They asked if we could removed the Gallstones. Informed her that is why we are order it is to make sure his gallbladder is working properly [employer] scheduling and got patient schedule for 02/27/2022 arrive at 7:45am for a 8:00 am scan. Nothing to eat or drink after midnight and do not take the tramadol after midnight. Called and informed patient of the date and time of the scan. He verbalized understanding

## 2022-01-17 NOTE — Telephone Encounter (Signed)
Gastric imaging study came back normal Right upper quadrant ultrasound reveals gallstones and fatty liver Recommend HIDA scan Most importantly, he needs his diabetes to be under control, avoid all the processed foods, sugary drinks, red meat which will help with his GI symptoms as well  RV

## 2022-01-22 DIAGNOSIS — I10 Essential (primary) hypertension: Secondary | ICD-10-CM | POA: Diagnosis not present

## 2022-01-22 DIAGNOSIS — E1129 Type 2 diabetes mellitus with other diabetic kidney complication: Secondary | ICD-10-CM | POA: Diagnosis not present

## 2022-01-22 DIAGNOSIS — N182 Chronic kidney disease, stage 2 (mild): Secondary | ICD-10-CM | POA: Diagnosis not present

## 2022-01-22 DIAGNOSIS — R809 Proteinuria, unspecified: Secondary | ICD-10-CM | POA: Diagnosis not present

## 2022-01-22 DIAGNOSIS — N390 Urinary tract infection, site not specified: Secondary | ICD-10-CM | POA: Diagnosis not present

## 2022-01-25 ENCOUNTER — Other Ambulatory Visit: Payer: Self-pay | Admitting: Nurse Practitioner

## 2022-01-25 DIAGNOSIS — E1129 Type 2 diabetes mellitus with other diabetic kidney complication: Secondary | ICD-10-CM

## 2022-01-25 DIAGNOSIS — J4540 Moderate persistent asthma, uncomplicated: Secondary | ICD-10-CM

## 2022-01-25 DIAGNOSIS — E1159 Type 2 diabetes mellitus with other circulatory complications: Secondary | ICD-10-CM

## 2022-01-25 DIAGNOSIS — K760 Fatty (change of) liver, not elsewhere classified: Secondary | ICD-10-CM

## 2022-01-26 ENCOUNTER — Ambulatory Visit: Payer: Medicare Other | Admitting: *Deleted

## 2022-01-28 NOTE — Patient Instructions (Incomplete)
Diabetes Mellitus Basics  Diabetes mellitus, or diabetes, is a long-term (chronic) disease. It occurs when the body does not properly use sugar (glucose) that is released from food after you eat. Diabetes mellitus may be caused by one or both of these problems: Your pancreas does not make enough of a hormone called insulin. Your body does not react in a normal way to the insulin that it makes. Insulin lets glucose enter cells in your body. This gives you energy. If you have diabetes, glucose cannot get into cells. This causes high blood glucose (hyperglycemia). How to treat and manage diabetes You may need to take insulin or other diabetes medicines daily to keep your glucose in balance. If you are prescribed insulin, you will learn how to give yourself insulin by injection. You may need to adjust the amount of insulin you take based on the foods that you eat. You will need to check your blood glucose levels using a glucose monitor as told by your health care provider. The readings can help determine if you have low or high blood glucose. Generally, you should have these blood glucose levels: Before meals (preprandial): 80-130 mg/dL (4.4-7.2 mmol/L). After meals (postprandial): below 180 mg/dL (10 mmol/L). Hemoglobin A1c (HbA1c) level: less than 7%. Your health care provider will set treatment goals for you. Keep all follow-up visits. This is important. Follow these instructions at home: Diabetes medicines Take your diabetes medicines every day as told by your health care provider. List your diabetes medicines here: Name of medicine: ______________________________ Amount (dose): _______________ Time (a.m./p.m.): _______________ Notes: ___________________________________ Name of medicine: ______________________________ Amount (dose): _______________ Time (a.m./p.m.): _______________ Notes: ___________________________________ Name of medicine: ______________________________ Amount (dose):  _______________ Time (a.m./p.m.): _______________ Notes: ___________________________________ Insulin If you use insulin, list the types of insulin you use here: Insulin type: ______________________________ Amount (dose): _______________ Time (a.m./p.m.): _______________Notes: ___________________________________ Insulin type: ______________________________ Amount (dose): _______________ Time (a.m./p.m.): _______________ Notes: ___________________________________ Insulin type: ______________________________ Amount (dose): _______________ Time (a.m./p.m.): _______________ Notes: ___________________________________ Insulin type: ______________________________ Amount (dose): _______________ Time (a.m./p.m.): _______________ Notes: ___________________________________ Insulin type: ______________________________ Amount (dose): _______________ Time (a.m./p.m.): _______________ Notes: ___________________________________ Managing blood glucose  Check your blood glucose levels using a glucose monitor as told by your health care provider. Write down the times that you check your glucose levels here: Time: _______________ Notes: ___________________________________ Time: _______________ Notes: ___________________________________ Time: _______________ Notes: ___________________________________ Time: _______________ Notes: ___________________________________ Time: _______________ Notes: ___________________________________ Time: _______________ Notes: ___________________________________  Low blood glucose Low blood glucose (hypoglycemia) is when glucose is at or below 70 mg/dL (3.9 mmol/L). Symptoms may include: Feeling: Hungry. Sweaty and clammy. Irritable or easily upset. Dizzy. Sleepy. Having: A fast heartbeat. A headache. A change in your vision. Numbness around the mouth, lips, or tongue. Having trouble with: Moving (coordination). Sleeping. Treating low blood glucose To treat low blood  glucose, eat or drink something containing sugar right away. If you can think clearly and swallow safely, follow the 15:15 rule: Take 15 grams of a fast-acting carb (carbohydrate), as told by your health care provider. Some fast-acting carbs are: Glucose tablets: take 3-4 tablets. Hard candy: eat 3-5 pieces. Fruit juice: drink 4 oz (120 mL). Regular (not diet) soda: drink 4-6 oz (120-180 mL). Honey or sugar: eat 1 Tbsp (15 mL). Check your blood glucose levels 15 minutes after you take the carb. If your glucose is still at or below 70 mg/dL (3.9 mmol/L), take 15 grams of a carb again. If your glucose does not go above 70 mg/dL (3.9 mmol/L) after  3 tries, get help right away. After your glucose goes back to normal, eat a meal or a snack within 1 hour. Treating very low blood glucose If your glucose is at or below 54 mg/dL (3 mmol/L), you have very low blood glucose (severe hypoglycemia). This is an emergency. Do not wait to see if the symptoms will go away. Get medical help right away. Call your local emergency services (911 in the [place] Do not drive yourself to the hospital. Questions to ask your health care provider Should I talk with a diabetes educator? What equipment will I need to care for myself at home? What diabetes medicines do I need? When should I take them? How often do I need to check my blood glucose levels? What number can I call if I have questions? When is my follow-up visit? Where can I find a support group for people with diabetes? Where to find more information American Diabetes Association: www.diabetes.org Association of Diabetes Care and Education Specialists: www.diabeteseducator.org Contact a health care provider if: Your blood glucose is at or above 240 mg/dL (13.3 mmol/L) for 2 days in a row. You have been sick or have had a fever for 2 days or more, and you are not getting better. You have any of these problems for more than 6 hours: You cannot eat or  drink. You feel nauseous. You vomit. You have diarrhea. Get help right away if: Your blood glucose is lower than 54 mg/dL (3 mmol/L). You get confused. You have trouble thinking clearly. You have trouble breathing. These symptoms may represent a serious problem that is an emergency. Do not wait to see if the symptoms will go away. Get medical help right away. Call your local emergency services (911 in the [place] Do not drive yourself to the hospital. Summary Diabetes mellitus is a chronic disease that occurs when the body does not properly use sugar (glucose) that is released from food after you eat. Take insulin and diabetes medicines as told. Check your blood glucose every day, as often as told. Keep all follow-up visits. This is important. This information is not intended to replace advice given to you by your health care provider. Make sure you discuss any questions you have with your health care provider. Document Revised: 07/07/2019 Document Reviewed: 07/07/2019 Elsevier Patient Education  [place].

## 2022-01-29 ENCOUNTER — Ambulatory Visit: Payer: Medicare Other | Admitting: Nurse Practitioner

## 2022-01-29 ENCOUNTER — Encounter: Payer: Medicare Other | Admitting: *Deleted

## 2022-01-30 ENCOUNTER — Encounter: Payer: Self-pay | Admitting: Nurse Practitioner

## 2022-01-30 ENCOUNTER — Ambulatory Visit: Payer: Medicare Other | Admitting: Nurse Practitioner

## 2022-01-30 ENCOUNTER — Ambulatory Visit (INDEPENDENT_AMBULATORY_CARE_PROVIDER_SITE_OTHER): Payer: Medicare Other | Admitting: Nurse Practitioner

## 2022-01-30 VITALS — BP 128/82 | HR 62 | Temp 97.9°F | Wt 172.1 lb

## 2022-01-30 DIAGNOSIS — K760 Fatty (change of) liver, not elsewhere classified: Secondary | ICD-10-CM

## 2022-01-30 DIAGNOSIS — R011 Cardiac murmur, unspecified: Secondary | ICD-10-CM

## 2022-01-30 DIAGNOSIS — E1129 Type 2 diabetes mellitus with other diabetic kidney complication: Secondary | ICD-10-CM

## 2022-01-30 DIAGNOSIS — E538 Deficiency of other specified B group vitamins: Secondary | ICD-10-CM

## 2022-01-30 DIAGNOSIS — E1165 Type 2 diabetes mellitus with hyperglycemia: Secondary | ICD-10-CM

## 2022-01-30 DIAGNOSIS — I152 Hypertension secondary to endocrine disorders: Secondary | ICD-10-CM

## 2022-01-30 DIAGNOSIS — E701 Other hyperphenylalaninemias: Secondary | ICD-10-CM | POA: Diagnosis not present

## 2022-01-30 DIAGNOSIS — E6609 Other obesity due to excess calories: Secondary | ICD-10-CM

## 2022-01-30 DIAGNOSIS — E1169 Type 2 diabetes mellitus with other specified complication: Secondary | ICD-10-CM

## 2022-01-30 DIAGNOSIS — R809 Proteinuria, unspecified: Secondary | ICD-10-CM

## 2022-01-30 DIAGNOSIS — E559 Vitamin D deficiency, unspecified: Secondary | ICD-10-CM

## 2022-01-30 DIAGNOSIS — J4540 Moderate persistent asthma, uncomplicated: Secondary | ICD-10-CM

## 2022-01-30 DIAGNOSIS — E785 Hyperlipidemia, unspecified: Secondary | ICD-10-CM

## 2022-01-30 DIAGNOSIS — K219 Gastro-esophageal reflux disease without esophagitis: Secondary | ICD-10-CM | POA: Diagnosis not present

## 2022-01-30 DIAGNOSIS — N400 Enlarged prostate without lower urinary tract symptoms: Secondary | ICD-10-CM

## 2022-01-30 DIAGNOSIS — Z8673 Personal history of transient ischemic attack (TIA), and cerebral infarction without residual deficits: Secondary | ICD-10-CM

## 2022-01-30 DIAGNOSIS — E1159 Type 2 diabetes mellitus with other circulatory complications: Secondary | ICD-10-CM

## 2022-01-30 DIAGNOSIS — Z23 Encounter for immunization: Secondary | ICD-10-CM

## 2022-01-30 NOTE — Assessment & Plan Note (Signed)
History of low levels, check today and start supplement as needed.

## 2022-01-30 NOTE — Assessment & Plan Note (Signed)
Chronic, ongoing.  Continue Lipitor and Fenofibrate. Lipid panel today.  Adjust doses as needed, if LDL >70.

## 2022-01-30 NOTE — Assessment & Plan Note (Signed)
Grade 2/6 on auscultation, continue collaboration with cardiology.

## 2022-01-30 NOTE — Patient Instructions (Signed)
Diabetes Mellitus Basics  Diabetes mellitus, or diabetes, is a long-term (chronic) disease. It occurs when the body does not properly use sugar (glucose) that is released from food after you eat. Diabetes mellitus may be caused by one or both of these problems: Your pancreas does not make enough of a hormone called insulin. Your body does not react in a normal way to the insulin that it makes. Insulin lets glucose enter cells in your body. This gives you energy. If you have diabetes, glucose cannot get into cells. This causes high blood glucose (hyperglycemia). How to treat and manage diabetes You may need to take insulin or other diabetes medicines daily to keep your glucose in balance. If you are prescribed insulin, you will learn how to give yourself insulin by injection. You may need to adjust the amount of insulin you take based on the foods that you eat. You will need to check your blood glucose levels using a glucose monitor as told by your health care provider. The readings can help determine if you have low or high blood glucose. Generally, you should have these blood glucose levels: Before meals (preprandial): 80-130 mg/dL (4.4-7.2 mmol/L). After meals (postprandial): below 180 mg/dL (10 mmol/L). Hemoglobin A1c (HbA1c) level: less than 7%. Your health care provider will set treatment goals for you. Keep all follow-up visits. This is important. Follow these instructions at home: Diabetes medicines Take your diabetes medicines every day as told by your health care provider. List your diabetes medicines here: Name of medicine: ______________________________ Amount (dose): _______________ Time (a.m./p.m.): _______________ Notes: ___________________________________ Name of medicine: ______________________________ Amount (dose): _______________ Time (a.m./p.m.): _______________ Notes: ___________________________________ Name of medicine: ______________________________ Amount (dose):  _______________ Time (a.m./p.m.): _______________ Notes: ___________________________________ Insulin If you use insulin, list the types of insulin you use here: Insulin type: ______________________________ Amount (dose): _______________ Time (a.m./p.m.): _______________Notes: ___________________________________ Insulin type: ______________________________ Amount (dose): _______________ Time (a.m./p.m.): _______________ Notes: ___________________________________ Insulin type: ______________________________ Amount (dose): _______________ Time (a.m./p.m.): _______________ Notes: ___________________________________ Insulin type: ______________________________ Amount (dose): _______________ Time (a.m./p.m.): _______________ Notes: ___________________________________ Insulin type: ______________________________ Amount (dose): _______________ Time (a.m./p.m.): _______________ Notes: ___________________________________ Managing blood glucose  Check your blood glucose levels using a glucose monitor as told by your health care provider. Write down the times that you check your glucose levels here: Time: _______________ Notes: ___________________________________ Time: _______________ Notes: ___________________________________ Time: _______________ Notes: ___________________________________ Time: _______________ Notes: ___________________________________ Time: _______________ Notes: ___________________________________ Time: _______________ Notes: ___________________________________  Low blood glucose Low blood glucose (hypoglycemia) is when glucose is at or below 70 mg/dL (3.9 mmol/L). Symptoms may include: Feeling: Hungry. Sweaty and clammy. Irritable or easily upset. Dizzy. Sleepy. Having: A fast heartbeat. A headache. A change in your vision. Numbness around the mouth, lips, or tongue. Having trouble with: Moving (coordination). Sleeping. Treating low blood glucose To treat low blood  glucose, eat or drink something containing sugar right away. If you can think clearly and swallow safely, follow the 15:15 rule: Take 15 grams of a fast-acting carb (carbohydrate), as told by your health care provider. Some fast-acting carbs are: Glucose tablets: take 3-4 tablets. Hard candy: eat 3-5 pieces. Fruit juice: drink 4 oz (120 mL). Regular (not diet) soda: drink 4-6 oz (120-180 mL). Honey or sugar: eat 1 Tbsp (15 mL). Check your blood glucose levels 15 minutes after you take the carb. If your glucose is still at or below 70 mg/dL (3.9 mmol/L), take 15 grams of a carb again. If your glucose does not go above 70 mg/dL (3.9 mmol/L) after  3 tries, get help right away. After your glucose goes back to normal, eat a meal or a snack within 1 hour. Treating very low blood glucose If your glucose is at or below 54 mg/dL (3 mmol/L), you have very low blood glucose (severe hypoglycemia). This is an emergency. Do not wait to see if the symptoms will go away. Get medical help right away. Call your local emergency services (911 in the [place] Do not drive yourself to the hospital. Questions to ask your health care provider Should I talk with a diabetes educator? What equipment will I need to care for myself at home? What diabetes medicines do I need? When should I take them? How often do I need to check my blood glucose levels? What number can I call if I have questions? When is my follow-up visit? Where can I find a support group for people with diabetes? Where to find more information American Diabetes Association: www.diabetes.org Association of Diabetes Care and Education Specialists: www.diabeteseducator.org Contact a health care provider if: Your blood glucose is at or above 240 mg/dL (13.3 mmol/L) for 2 days in a row. You have been sick or have had a fever for 2 days or more, and you are not getting better. You have any of these problems for more than 6 hours: You cannot eat or  drink. You feel nauseous. You vomit. You have diarrhea. Get help right away if: Your blood glucose is lower than 54 mg/dL (3 mmol/L). You get confused. You have trouble thinking clearly. You have trouble breathing. These symptoms may represent a serious problem that is an emergency. Do not wait to see if the symptoms will go away. Get medical help right away. Call your local emergency services (911 in the [place] Do not drive yourself to the hospital. Summary Diabetes mellitus is a chronic disease that occurs when the body does not properly use sugar (glucose) that is released from food after you eat. Take insulin and diabetes medicines as told. Check your blood glucose every day, as often as told. Keep all follow-up visits. This is important. This information is not intended to replace advice given to you by your health care provider. Make sure you discuss any questions you have with your health care provider. Document Revised: 07/07/2019 Document Reviewed: 07/07/2019 Elsevier Patient Education  [place].

## 2022-01-30 NOTE — Progress Notes (Signed)
BP 128/82 (BP Location: Left Arm, Patient Position: Sitting, Cuff Size: Normal)   Pulse 62   Temp 97.9 F (36.6 C) (Oral)   Wt 172 lb 1.6 oz (78.1 kg)   SpO2 95%   BMI 29.54 kg/m    Subjective:    Patient ID: [name]., male    DOB: Jul 14, 1960, 61 y.o.   MRN: [id number]  HPI: [name]. is a 60 y.o. male  Chief Complaint  Patient presents with   Diabetes   Hyperlipidemia   Hypertension   Chronic Kidney Disease   Gastroesophageal Reflux   DIABETES & PHENYLKETONURIA A1c in August 2023 was 10% -- do not have endo notes at this time.  Seen by endocrinology recently, Dr. Ronnald Collum, change insulin + DEXCOM ordered on review of list -- he has them but reports not knowing how to apply -- discussed with him need to see endocrinology ASAP to help set this up for him.  [name], his wife, reports he sees endo again on Thursday.  [name] reports endo stopped Humalog -- Lyumjev sliding scale.  Tyler Aas remains the same.  Continues [place], [place], and Tresiba 40 units BID.  He is working on diet changes with his wife.  Has had phenylketonuria since childhood and monitors diet best he can per his report.  He has been cutting back on sodas.  Has tried Metformin in past x 2 both times AKI presented, history of pancreatitis and can not take GLP1.  On review tried Januvia in past, he does not recall any ADR with this. 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 Dr. Humphrey Rolls for cardiology, has not seen in awhile, is to have a stress test in future.  Saw Dr. Melrose Nakayama on 11/09/19 -- he is to continue Plavix and ASA per their note.  Saw vascular on 07/24/21.   Current medications Lisinopril, Rosuvastatin, Fenofibrate, NTG.   Satisfied with current treatment? yes Duration of hypertension: chronic BP monitoring frequency: daily BP range:  BP medication side effects: no Duration of hyperlipidemia: chronic Cholesterol medication side effects: no Cholesterol supplements: none Medication compliance: good compliance Aspirin: yes Recent stressors: no Recurrent headaches: no Visual changes: no Palpitations: no Dyspnea: occasional Chest pain: no Lower extremity edema: no Dizzy/lightheaded: no  PROTEINURIA Last visit with nephrology on 09/27/21. Proteinuria and CKD Stage II.  Recent nephro labs 01/22/22 CRT 0.99, eGFR 88. CKD status: stable Medications renally dose: yes Previous renal evaluation: yes Pneumovax:  Up to Date Influenza Vaccine:  Up to Date    GERD  Continues on Omeprazole 20 MG daily.  Had gastric emptying test on 01/12/22 = this was normal -- is to have further testing to check on gall bladder. GERD control status: stable Satisfied with current treatment? yes Heartburn frequency: occasional Medication side effects: no  Medication compliance: stable Previous GERD medications: Antacid use frequency:  none Dysphagia: no Odynophagia:  no Hematemesis: no Blood in stool: no EGD: yes   Relevant past  medical, surgical, family and social history reviewed and updated as indicated. Interim medical history since our last visit reviewed. Allergies and medications reviewed and updated.  Review of Systems  Constitutional:  Negative for activity change, diaphoresis, fatigue and fever.  Respiratory:  Negative for cough, chest tightness, shortness of breath and wheezing.   Cardiovascular:  Negative for  chest pain, palpitations and leg swelling.  Gastrointestinal: Negative.   Endocrine: Negative for cold intolerance, heat intolerance, polydipsia, polyphagia and polyuria.  Neurological: Negative.   Psychiatric/Behavioral: Negative.     Per HPI unless specifically indicated above     Objective:    BP 128/82 (BP Location: Left Arm, Patient Position: Sitting, Cuff Size: Normal)   Pulse 62   Temp 97.9 F (36.6 C) (Oral)   Wt 172 lb 1.6 oz (78.1 kg)   SpO2 95%   BMI 29.54 kg/m   Wt Readings from Last 3 Encounters:  01/30/22 172 lb 1.6 oz (78.1 kg)  11/01/21 174 lb 9.6 oz (79.2 kg)  10/27/21 174 lb 1.6 oz (79 kg)    Physical Exam Vitals and nursing note reviewed.  Constitutional:      General: He is awake. He is not in acute distress.    Appearance: He is well-developed. He is obese. He is not ill-appearing.  HENT:     Head: Normocephalic and atraumatic.     Right Ear: Hearing normal. No drainage.     Left Ear: Hearing normal. No drainage.  Eyes:     General: Lids are normal.        Right eye: No discharge.        Left eye: No discharge.     Conjunctiva/sclera: Conjunctivae normal.     Pupils: Pupils are equal, round, and reactive to light.  Neck:     Thyroid: No thyromegaly.     Vascular: Carotid bruit (R>L) present.  Cardiovascular:     Rate and Rhythm: Normal rate and regular rhythm.     Heart sounds: S1 normal and S2 normal. Murmur heard.     Systolic murmur is present with a grade of 2/6.     No gallop.     Comments: Systolic murmur noted best at left sternal border, soft. Pulmonary:     Effort: Pulmonary effort is normal. No accessory muscle usage or respiratory distress.     Breath sounds: Normal breath sounds.  Abdominal:     General: Bowel sounds are normal.     Palpations: Abdomen is soft.  Musculoskeletal:     Cervical back: Normal range of motion and neck supple.     Right lower leg: No edema.     Left lower leg: No edema.     Comments: Slight antalgic  gait present.  Skin:    General: Skin is warm and dry.     Capillary Refill: Capillary refill takes less than 2 seconds.  Neurological:     Mental Status: He is alert and oriented to person, place, and time.     Deep Tendon Reflexes: Reflexes are normal and symmetric.  Psychiatric:        Attention and Perception: Attention normal.        Mood and Affect: Mood normal.        Speech: Speech normal.        Behavior: Behavior normal. Behavior is cooperative.    Results for orders placed or performed in visit on 10/27/21  Bayer DCA Hb A1c Waived  Result Value Ref Range   HB  A1C (BAYER DCA - WAIVED) 10.0 (H) 4.8 - 5.6 %  Comprehensive metabolic panel  Result Value Ref Range   Glucose 295 (H) 70 - 99 mg/dL   BUN 18 6 - 24 mg/dL   Creatinine, Ser 1.27 0.76 - 1.27 mg/dL   eGFR 65 >59 mL/min/1.73   BUN/Creatinine Ratio 14 9 - 20   Sodium 135 134 - 144 mmol/L   Potassium 4.1 3.5 - 5.2 mmol/L   Chloride 101 96 - 106 mmol/L   CO2 18 (L) 20 - 29 mmol/L   Calcium 9.4 8.7 - 10.2 mg/dL   Total Protein 6.6 6.0 - 8.5 g/dL   Albumin 4.3 3.8 - 4.9 g/dL   Globulin, Total 2.3 1.5 - 4.5 g/dL   Albumin/Globulin Ratio 1.9 1.2 - 2.2   Bilirubin Total 0.3 0.0 - 1.2 mg/dL   Alkaline Phosphatase 152 (H) 44 - 121 IU/L   AST 24 0 - 40 IU/L   ALT 26 0 - 44 IU/L  Lipid Panel w/o Chol/HDL Ratio  Result Value Ref Range   Cholesterol, Total 250 (H) 100 - 199 mg/dL   Triglycerides 487 (H) 0 - 149 mg/dL   HDL 35 (L) >39 mg/dL   VLDL Cholesterol Cal 88 (H) 5 - 40 mg/dL   LDL Chol Calc (NIH) 127 (H) 0 - 99 mg/dL  VITAMIN D 25 Hydroxy (Vit-D Deficiency, Fractures)  Result Value Ref Range   Vit D, 25-Hydroxy 21.1 (L) 30.0 - 100.0 ng/mL      Assessment & Plan:   Problem List Items Addressed This Visit       Cardiovascular and Mediastinum   Hypertension associated with diabetes (HCC)    Chronic, ongoing.  Initial BP above goal and repeat at goal.  Will continue Lisinopril 10 MG dosing, further adjust  next visit if remains on lower side.  Labs: up to date with nephrology.  Recommend they check his BP at home at least daily and document + focus on DASH diet.  Return to office in 3 months.        Digestive   GERD (gastroesophageal reflux disease)    Chronic, ongoing.  Continue Omeprazole daily and adjust as needed.  Mag level up to date.  Risks of PPI use were discussed with patient including bone loss, C. Diff diarrhea, pneumonia, infections, CKD, electrolyte abnormalities.  Verbalizes understanding and chooses to continue the medication.       NAFLD (nonalcoholic fatty liver disease)    Ongoing, recent CT scan reassuring.  Continue collaboration with GI provider.        Endocrine   Hyperlipidemia associated with type 2 diabetes mellitus (HCC)    Chronic, ongoing.  Continue Lipitor and Fenofibrate. Lipid panel today.  Adjust doses as needed, if LDL >70.      Relevant Orders   Lipid Panel w/o Chol/HDL Ratio   Type 2 diabetes mellitus with proteinuria (HCC)    Chronic, ongoing.  A1c upward trend at 10% recent visit with PCP -- do not have endo labs will work on obtaining notes.  Urine ALB 28 April 2021.  - Continue Jardiance, Januvia, and increase Tresiba to 40 units BID (would like to reduce this in future to be more in goal range for weight) + new short insulin from endo.   - Poor tolerance to Metformin in past with AKI presenting x 2 trials and allergic to Sulfa.  Would avoid GLP at this time due to patient history of pancreatitis x 3, concern this would  flare.  Educated patient at length on effect of diabetes from head to toe and increased risk for recurrent CVA due to poor control.   - Recommend they check his BS TID -- he would benefit from [facility] will work on this with CCM team in future.   - Goal A1C <6.5% due to CVA history -- which reiterated at length with patient today.  Continue collaboration with CCM team.   - Continue collaboration with endo, will obtain  notes - Return in 3 months.      Uncontrolled type 2 diabetes mellitus with hyperglycemia ([place]) - Primary    Refer to diabetes with proteinuria plan of care.        Other   B12 deficiency    History of low levels, check today and start supplement as needed.      Relevant Orders   Vitamin B12   Heart murmur    Grade 2/6 on auscultation, continue collaboration with cardiology.        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      Vitamin D deficiency    Ongoing, stable.  Continue supplement and recheck level today.      Relevant Orders   VITAMIN D 25 Hydroxy (Vit-D Deficiency, Fractures)   Other Visit Diagnoses     Benign prostatic hyperplasia without lower urinary tract symptoms       PSA on labs today   Relevant Orders   PSA        Follow up plan: Return in about 3 months (around 05/02/2022) for T2DM, HTN/HLD, GERD, VIT B12, VIT D (40 minutes please).

## 2022-01-30 NOTE — Assessment & Plan Note (Signed)
Chronic, ongoing.  Continue Omeprazole daily and adjust as needed.  Mag level up to date.  Risks of PPI use were discussed with patient including bone loss, C. Diff diarrhea, pneumonia, infections, CKD, electrolyte abnormalities.  Verbalizes understanding and chooses to continue the medication.

## 2022-01-30 NOTE — Assessment & Plan Note (Signed)
Chronic, ongoing.  Initial BP above goal and repeat at goal.  Will continue Lisinopril 10 MG dosing, further adjust next visit if remains on lower side.  Labs: up to date with nephrology.  Recommend they check his BP at home at least daily and document + focus on DASH diet.  Return to office in 3 months.

## 2022-01-30 NOTE — Assessment & Plan Note (Signed)
Refer to diabetes with proteinuria plan of care.

## 2022-01-30 NOTE — Assessment & Plan Note (Signed)
Mental delays present, continue to work with CCM team in [place] with education and reiterating diet needs.

## 2022-01-30 NOTE — Assessment & Plan Note (Signed)
Chronic, ongoing.  A1c upward trend at 10% recent visit with PCP -- do not have endo labs will work on obtaining notes.  Urine ALB 28 April 2021.  - Continue Jardiance, Januvia, and increase Tresiba to 40 units BID (would like to reduce this in future to be more in goal range for weight) + new short insulin from endo.   - Poor tolerance to Metformin in past with AKI presenting x 2 trials and allergic to Sulfa.  Would avoid GLP at this time due to patient history of pancreatitis x 3, concern this would flare.  Educated patient at length on effect of diabetes from head to toe and increased risk for recurrent CVA due to poor control.   - Recommend they check his BS TID -- he would benefit from [facility] will work on this with CCM team in future.   - Goal A1C <6.5% due to CVA history -- which reiterated at length with patient today.  Continue collaboration with CCM team.   - Continue collaboration with endo, will obtain notes - Return in 3 months.

## 2022-01-30 NOTE — Assessment & Plan Note (Signed)
Ongoing, stable.  Continue supplement and recheck level today.

## 2022-01-30 NOTE — Assessment & Plan Note (Signed)
Ongoing, recent CT scan reassuring.  Continue collaboration with GI provider.

## 2022-01-31 ENCOUNTER — Other Ambulatory Visit: Payer: Self-pay | Admitting: Nurse Practitioner

## 2022-01-31 DIAGNOSIS — E1169 Type 2 diabetes mellitus with other specified complication: Secondary | ICD-10-CM

## 2022-01-31 DIAGNOSIS — I10 Essential (primary) hypertension: Secondary | ICD-10-CM | POA: Diagnosis not present

## 2022-01-31 DIAGNOSIS — N182 Chronic kidney disease, stage 2 (mild): Secondary | ICD-10-CM | POA: Diagnosis not present

## 2022-01-31 DIAGNOSIS — R809 Proteinuria, unspecified: Secondary | ICD-10-CM | POA: Diagnosis not present

## 2022-01-31 DIAGNOSIS — E1129 Type 2 diabetes mellitus with other diabetic kidney complication: Secondary | ICD-10-CM | POA: Diagnosis not present

## 2022-01-31 LAB — LIPID PANEL W/O CHOL/HDL RATIO
Cholesterol, Total: 277 mg/dL — ABNORMAL HIGH (ref 100–199)
HDL: 20 mg/dL — ABNORMAL LOW (ref >39)
Triglycerides: 1384 mg/dL (ref 0–149)

## 2022-01-31 LAB — VITAMIN D 25 HYDROXY (VIT D DEFICIENCY, FRACTURES): Vit D, 25-Hydroxy: 14.4 ng/mL — ABNORMAL LOW (ref 30.0–100.0)

## 2022-01-31 LAB — VITAMIN B12: Vitamin B-12: 312 pg/mL (ref 232–1245)

## 2022-01-31 LAB — PSA: Prostate Specific Ag, Serum: 0.3 ng/mL (ref 0.0–4.0)

## 2022-01-31 MED ORDER — CHOLECALCIFEROL 1.25 MG (50000 UT) PO TABS: 1.0000 | ORAL_TABLET | ORAL | 3 refills | Status: DC

## 2022-01-31 MED ORDER — VITAMIN B-12 1000 MCG PO TABS
1000.0000 ug | ORAL_TABLET | Freq: Every day | ORAL | 4 refills | Status: DC
Start: 2022-01-31 — End: 2022-03-30

## 2022-01-31 NOTE — Progress Notes (Signed)
Please speak to [name] and his wife to alert to labs: - Prostate blood work remains normal. - Vitamin D and B12 levels are low still, I am resending supplements to pharmacy and he needs to ensure to take daily please. - Cholesterol levels show very elevated triglycerides -- he needs to ensure he is taking Vascepa that Dr. Ronnald Collum sent in.  I read his recent note on this.  Also ensure he keeps appointments with Dr. Ronnald Collum (diabetes doctor), no missing visits -- this is very important as he as a plan in place to help [name] with diabetes.  We need to get it under control to prevent stroke.  I would like to repeat his cholesterol labs in 2 weeks outpatient with him fasting please.  Schedule this with him.  Any questions? Keep being stellar!!  Thank you for allowing me to participate in your care.  I appreciate you. Kindest regards, [name]

## 2022-02-01 ENCOUNTER — Encounter: Payer: Medicare Other | Admitting: *Deleted

## 2022-02-01 DIAGNOSIS — E1165 Type 2 diabetes mellitus with hyperglycemia: Secondary | ICD-10-CM | POA: Diagnosis not present

## 2022-02-01 DIAGNOSIS — K219 Gastro-esophageal reflux disease without esophagitis: Secondary | ICD-10-CM | POA: Diagnosis not present

## 2022-02-01 DIAGNOSIS — N183 Chronic kidney disease, stage 3 unspecified: Secondary | ICD-10-CM | POA: Diagnosis not present

## 2022-02-01 DIAGNOSIS — E7849 Other hyperlipidemia: Secondary | ICD-10-CM | POA: Diagnosis not present

## 2022-02-01 DIAGNOSIS — I10 Essential (primary) hypertension: Secondary | ICD-10-CM | POA: Diagnosis not present

## 2022-02-02 ENCOUNTER — Telehealth: Payer: Self-pay | Admitting: Nurse Practitioner

## 2022-02-02 ENCOUNTER — Other Ambulatory Visit: Payer: Self-pay | Admitting: Nurse Practitioner

## 2022-02-02 MED ORDER — TRIAMCINOLONE ACETONIDE 0.1 % EX CREA: 1.0000 | TOPICAL_CREAM | Freq: Two times a day (BID) | CUTANEOUS | 1 refills | Status: DC

## 2022-02-02 NOTE — Telephone Encounter (Signed)
Pt / spouse is calling in for assistance. Pt was seen by provider for a rash on 11/14. Pt says that provider was suppose to send in a cream for his rash. Pt says that he spoke with pharmacy and was told that Rx wasn't there. Pt is unaware of the name of Rx. Pt would like further assistance.    Pharmacy:  [facility], [place]. Phone: [phone]  Fax: [phone]

## 2022-02-05 DIAGNOSIS — K219 Gastro-esophageal reflux disease without esophagitis: Secondary | ICD-10-CM | POA: Diagnosis not present

## 2022-02-05 DIAGNOSIS — I10 Essential (primary) hypertension: Secondary | ICD-10-CM | POA: Diagnosis not present

## 2022-02-05 DIAGNOSIS — G459 Transient cerebral ischemic attack, unspecified: Secondary | ICD-10-CM | POA: Diagnosis not present

## 2022-02-05 DIAGNOSIS — E7849 Other hyperlipidemia: Secondary | ICD-10-CM | POA: Diagnosis not present

## 2022-02-05 DIAGNOSIS — N183 Chronic kidney disease, stage 3 unspecified: Secondary | ICD-10-CM | POA: Diagnosis not present

## 2022-02-05 DIAGNOSIS — E1165 Type 2 diabetes mellitus with hyperglycemia: Secondary | ICD-10-CM | POA: Diagnosis not present

## 2022-02-06 ENCOUNTER — Telehealth: Payer: Self-pay

## 2022-02-06 ENCOUNTER — Telehealth: Payer: Medicare Other

## 2022-02-06 NOTE — Telephone Encounter (Signed)
  Care Management   Follow Up Note   02/06/2022 Name: [name]. MRN: [id number] DOB: 1960/09/19   Referred by: [name], NP Reason for referral : Chronic Care Management (RNCM: Initial Outreach for Chronic Disease Management and Care Coordination Needs- Attempt)   An unsuccessful telephone outreach was attempted today. The patient was referred to the case management team for assistance with care management and care coordination.   Follow Up Plan: A HIPPA compliant phone message was left for the patient providing contact information and requesting a return call.  Noreene Larsson RN, MSN, CCM RN Care Manager  Chronic Care Management Direct Number: [phone]

## 2022-02-07 ENCOUNTER — Other Ambulatory Visit: Payer: Self-pay | Admitting: Nurse Practitioner

## 2022-02-07 ENCOUNTER — Ambulatory Visit (INDEPENDENT_AMBULATORY_CARE_PROVIDER_SITE_OTHER): Payer: Medicare Other

## 2022-02-07 DIAGNOSIS — J4540 Moderate persistent asthma, uncomplicated: Secondary | ICD-10-CM

## 2022-02-07 DIAGNOSIS — R079 Chest pain, unspecified: Secondary | ICD-10-CM

## 2022-02-07 DIAGNOSIS — E1165 Type 2 diabetes mellitus with hyperglycemia: Secondary | ICD-10-CM

## 2022-02-07 MED ORDER — NITROGLYCERIN 0.4 MG SL SUBL: 0.4000 mg | SUBLINGUAL_TABLET | SUBLINGUAL | 3 refills | Status: DC | PRN

## 2022-02-07 NOTE — Plan of Care (Signed)
Chronic Care Management Provider Comprehensive Care Plan    02/07/2022 Name: [name]. MRN: [id number] DOB: January 05, 1961  Referral to Chronic Care Management (CCM) services was placed by Provider:  Marnee Guarneri on Date: 01-25-2022.  Chronic Condition 1: DM Provider Assessment and Plan   Chronic, ongoing.  A1c upward trend at 10% recent visit with PCP -- do not have endo labs will work on obtaining notes.  Urine ALB 28 April 2021.  - Continue Jardiance, Januvia, and increase Tresiba to 40 units BID (would like to reduce this in future to be more in goal range for weight) + new short insulin from endo.   - Poor tolerance to Metformin in past with AKI presenting x 2 trials and allergic to Sulfa.  Would avoid GLP at this time due to patient history of pancreatitis x 3, concern this would flare.  Educated patient at length on effect of diabetes from head to toe and increased risk for recurrent CVA due to poor control.   - Recommend they check his BS TID -- he would benefit from [facility] will work on this with CCM team in future.   - Goal A1C <6.5% due to CVA history -- which reiterated at length with patient today.  Continue collaboration with CCM team.   - Continue collaboration with endo, will obtain notes - Return in 3 months.         Uncontrolled type 2 diabetes mellitus with hyperglycemia ([place]) - Primary      Refer to diabetes with proteinuria plan of care.         Expected Outcome/Goals Addressed This Visit (Provider CCM goals/Provider Assessment and plan   CCM (DIABETES) EXPECTED OUTCOME: MONITOR, SELF-MANAGE AND REDUCE SYMPTOMS OF DIABETES  Symptom Management Condition 1: Take all medications as prescribed Attend all scheduled provider appointments Call provider office for new concerns or questions  call the Suicide and Crisis Lifeline: 988 call the [place] National Suicide Prevention Lifeline: 252-722-5625 or TTY: 530 602 7333 TTY (509)[phone]) to talk  to a trained counselor call 1-800-273-TALK (toll free, 24 hour hotline) if experiencing a Mental Health or [place]  check feet daily for cuts, sores or redness trim toenails straight across manage portion size wash and dry feet carefully every day wear comfortable, cotton socks wear comfortable, well-fitting shoes  Chronic Condition 2: HTN Provider Assessment and Plan Chronic, ongoing.  Initial BP above goal and repeat at goal.  Will continue Lisinopril 10 MG dosing, further adjust next visit if remains on lower side.  Labs: up to date with nephrology.  Recommend they check his BP at home at least daily and document + focus on DASH diet.  Return to office in 3 months.     Expected Outcome/Goals Addressed This Visit (Provider CCM goals/Provider Assessment and plan   CCM (HYPERTENSION)  EXPECTED OUTCOME:  MONITOR,SELF- MANAGE AND REDUCE SYMPTOMS OF HYPERTENSION   Symptom Management Condition 2: Take all medications as prescribed Attend all scheduled provider appointments Call provider office for new concerns or questions  call the Suicide and Crisis Lifeline: 988 call the [place] National Suicide Prevention Lifeline: 226 400 7358 or TTY: 617 416 2515 TTY [phone]) to talk to a trained counselor call 1-800-273-TALK (toll free, 24 hour hotline) if experiencing a Mental Health or [place]  check blood pressure 3 times per week write blood pressure results in a log or diary learn about high blood pressure keep a blood pressure log take blood pressure log to all doctor appointments call doctor  for signs and symptoms of high blood pressure keep all doctor appointments take medications for blood pressure exactly as prescribed report new symptoms to your doctor   Chronic Condition 3: Asthma Provider Assessment and Plan   Chronic, ongoing.     Expected Outcome/Goals Addressed This Visit (Provider CCM goals/Provider Assessment and plan  Goal: CCM  (COPD) EXPECTED OUTCOME: MONITOR, SELF-MANAGE AND REDUCE SYMPTOMS OF Asthma    Symptom Management Condition 3: Take all medications as prescribed Attend all scheduled provider appointments Call provider office for new concerns or questions  call the Suicide and Crisis Lifeline: 988 call the [place] National Suicide Prevention Lifeline: 306 456 1772 or TTY: 571 303 8953 TTY [phone]) to talk to a trained counselor call 1-800-273-TALK (toll free, 24 hour hotline) if experiencing a Mental Health or [place] Crisis  Problem List Patient Active Problem List   Diagnosis Date Noted   Right hip pain 05/24/2021   B12 deficiency 04/26/2021   Heart murmur 09/21/2020   Type 2 diabetes mellitus with proteinuria ([place]) 03/25/2019   Atherosclerosis of both carotid arteries 03/22/2019   History of CVA (cerebrovascular accident) 03/06/2019   Uncontrolled type 2 diabetes mellitus with hyperglycemia ([place])    Obesity 09/03/2018   Frequent falls 05/15/2016   Vitamin D deficiency 08/22/2015   Carpal tunnel syndrome on left 05/12/2015   External hemorrhoids 12/20/2014   Allergic rhinitis 10/21/2014   Diverticulosis 10/21/2014   Hypertension associated with diabetes ([place]) 10/21/2014   Hyperlipidemia associated with type 2 diabetes mellitus ([place]) 10/21/2014   Asthma 10/21/2014   GERD (gastroesophageal reflux disease) 10/21/2014   NAFLD (nonalcoholic fatty liver disease) 10/21/2014   Arthritis 10/21/2014   History of pancreatitis 10/07/2014   Phenylketonuria (PKU) ([place]) 10/07/2014   Renal cyst, left 10/07/2014   Gall bladder polyp 10/01/2013    Medication Management  Current Outpatient Medications:    aspirin 81 MG EC tablet, Take 1 tablet (81 mg total) by mouth daily., Disp: 180 tablet, Rfl: 0   Blood Glucose Monitoring Suppl (ONE TOUCH ULTRA 2) w/Device KIT, Use to check blood sugar 4 times a day, Disp: 1 kit, Rfl: 0   BREO ELLIPTA 100-25 MCG/ACT AEPB, INHALE 1 PUFF BY MOUTH ONCE  DAILY, Disp: 60 each, Rfl: 2   Cholecalciferol 1.25 MG (50000 UT) TABS, Take 1 tablet by mouth once a week., Disp: 12 tablet, Rfl: 3   clopidogrel (PLAVIX) 75 MG tablet, Take 1 tablet by mouth daily., Disp: , Rfl:    cyanocobalamin (VITAMIN B12) 1000 MCG tablet, Take 1 tablet (1,000 mcg total) by mouth daily., Disp: 90 tablet, Rfl: 4   EPINEPHRINE 0.3 mg/0.3 mL IJ SOAJ injection, INJECT 1 SYRINGE INTO OUTER THIGH ONCE AS NEEDED FOR SEVERE ALLERGIC REACTION., Disp: 2 each, Rfl: 1   fenofibrate 54 MG tablet, TAKE 1 TABLET BY MOUTH ONCE DAILY, Disp: 90 tablet, Rfl: 3   gabapentin (NEURONTIN) 300 MG capsule, TAKE 1 CAPSULE BY MOUTH TWICE DAILY, Disp: 180 capsule, Rfl: 1   glucose blood (ONETOUCH ULTRA) test strip, 1 each by Other route 5 (five) times daily., Disp: 300 each, Rfl: 3   icosapent Ethyl (VASCEPA) 1 g capsule, Take 2 g by mouth 2 (two) times daily., Disp: , Rfl:    Insulin Disposable Pump (OMNIPOD DASH PDM, GEN 4,) KIT, AS PRESCRIBED USE AS DIRECTED BASED ON TOTAL DAILY INSULIN AND POD CHANGE FREQUENCY, Disp: , Rfl:    Insulin Pen Needle (PEN NEEDLES) 32G X 4 MM MISC, 1 kit by Does not apply route daily., Disp: 100  each, Rfl: 4   JARDIANCE 25 MG TABS tablet, TAKE 1 TABLET BY MOUTH ONCE DAILY, Disp: 90 tablet, Rfl: 1   Lancets (ONETOUCH DELICA PLUS PYKDXI33A) MISC, USE AS DIRECTED., Disp: 300 each, Rfl: 3   lisinopril (ZESTRIL) 10 MG tablet, Take 1 tablet (10 mg total) by mouth daily., Disp: 90 tablet, Rfl: 4   LYUMJEV KWIKPEN 100 UNIT/ML KwikPen, Inject into the skin. Per endocrinology directions, Disp: , Rfl:    Misc. Devices (PULSE OXIMETER FOR FINGER) MISC, To check O2 saturations once daily with asthma and document + check if any shortness of breath, Disp: 1 each, Rfl: 1   montelukast (SINGULAIR) 10 MG tablet, TAKE 1 TABLET BY MOUTH AT BEDTIME, Disp: 90 tablet, Rfl: 3   moxifloxacin (VIGAMOX) 0.5 % ophthalmic solution, Apply to eye., Disp: , Rfl:    nitroGLYCERIN (NITROSTAT) 0.4 MG SL  tablet, Place 1 tablet (0.4 mg total) under the tongue every 5 (five) minutes as needed for chest pain., Disp: 50 tablet, Rfl: 3   omeprazole (PRILOSEC) 20 MG capsule, TAKE 1 CAPSULE BY MOUTH ONCE DAILY, Disp: 90 capsule, Rfl: 1   rosuvastatin (CRESTOR) 40 MG tablet, Take 1 tablet (40 mg total) by mouth daily., Disp: 90 tablet, Rfl: 4   sitaGLIPtin (JANUVIA) 100 MG tablet, Take 1 tablet (100 mg total) by mouth daily., Disp: 90 tablet, Rfl: 4   tiZANidine (ZANAFLEX) 2 MG tablet, Take 1 tablet (2 mg total) by mouth every 6 (six) hours as needed for muscle spasms., Disp: 30 tablet, Rfl: 1   triamcinolone cream (KENALOG) 0.1 %, Apply 1 Application topically 2 (two) times daily., Disp: 30 g, Rfl: 1   trimethoprim-polymyxin b (POLYTRIM) ophthalmic solution, Place 1 drop into the right eye every 6 (six) hours. , Disp: , Rfl:    ULTRACARE PEN NEEDLES 32G X 5 MM MISC, , Disp: , Rfl:    valACYclovir (VALTREX) 1000 MG tablet, Take 1,000 mg by mouth 3 (three) times daily., Disp: , Rfl:   Cognitive Assessment Identity Confirmed: : Name; DOB Cognitive Status: Normal Other:  : HIPPA verified by the patients wife [name], patient was on speaker phone   Functional Assessment Hearing Difficulty or Deaf: no Wear Glasses or Blind: yes Vision Management: wears glasses Concentrating, Remembering or Making Decisions Difficulty (CP): yes Concentration Management: does not always make sound decisions about his health and well being Difficulty Communicating: no Difficulty Eating/Swallowing: no Walking or Climbing Stairs Difficulty: no Dressing/Bathing Difficulty: no Doing Errands Independently Difficulty (such as shopping) (CP): no Change in Functional Status Since Onset of Current Illness/Injury: no   Caregiver Assessment  Primary Source of Support/Comfort: spouse Name of Support/Comfort Primary Source: [name] People in Home: spouse Name(s) of People in Home: [name] Family Caregiver if Needed: spouse Family  Caregiver Names: [name] Primary Roles/Responsibilities: disabled Expected Impact of Illness/Hospitalization: has multiple chronic conditions that impact his care   Planned Interventions  Provided education to patient about basic DM disease process; Reviewed medications with patient and discussed importance of medication adherence;        Reviewed prescribed diet with patient heart healthy/ADA. The patient is doing better with ADA and options that will help with maintaining healthy blood sugars but having a hard time refraining from salt ; Counseled on importance of regular laboratory monitoring as prescribed;        Discussed plans with patient for ongoing care management follow up and provided patient with direct contact information for care management team;      Provided  patient with written educational materials related to hypo and hyperglycemia and importance of correct treatment. The patient has had a couple falls due to his blood sugars getting down to 40's. The patients wife states he was concerned about his blood sugars being too high so he has not been eating like he was supposed to. Education on safety and monitoring for changes in the way he feels;       Reviewed scheduled/upcoming provider appointments including: 03-23-2022 at 10 am;         Advised patient, providing education and rationale, to check cbg before meals and at bedtime and when you have symptoms of low or high blood sugar and record. The endocrinologist had given the patient a continuous glucose reader but he kept knocking it off of his arm. He does not wish to keep wearing this. Education on monitoring for changes and letting the provider know for acute changes.         call provider for findings outside established parameters;       Review of patient status, including review of consultants reports, relevant laboratory and other test results, and medications completed;       Advised patient to discuss changes in DM, questions,  or concerns with provider;     Sees eye specialist in January. Is possibly going to have new surgery to help vision  Screening for signs and symptoms of depression related to chronic disease state;        Assessed social determinant of health barriers;        Provided patient with basic written and verbal Asthma education on self care/management/and exacerbation prevention; Advised patient to track and manage Asthma triggers;  Provided written and verbal instructions on pursed lip breathing and utilized returned demonstration as teach back; Provided instruction about proper use of medications used for management of Asthma including inhalers; Advised patient to self assesses Asthma action plan zone and make appointment with provider if in the yellow zone for 48 hours without improvement; Advised patient to engage in light exercise as tolerated 3-5 days a week to aid in the the management of Asthma; Provided education about and advised patient to utilize infection prevention strategies to reduce risk of respiratory infection; Discussed the importance of adequate rest and management of fatigue with Asthma; Screening for signs and symptoms of depression related to chronic disease state;  Assessed social determinant of health barriers;  Evaluation of current treatment plan related to hypertension self management and patient's adherence to plan as established by provider;   Provided education to patient re: stroke prevention, s/s of heart attack and stroke; Reviewed prescribed diet heart healthy/ADA diet  Reviewed medications with patient and discussed importance of compliance;  Discussed plans with patient for ongoing care management follow up and provided patient with direct contact information for care management team; Advised patient, providing education and rationale, to monitor blood pressure daily and record, calling PCP for findings outside established parameters;  Reviewed scheduled/upcoming  provider appointments including: 03-23-2022 with pcp Advised patient to discuss changes in blood pressures or heart health with provider; Provided education on prescribed diet heart healthy/ADA diet ;  Discussed complications of poorly controlled blood pressure such as heart disease, stroke, circulatory complications, vision complications, kidney impairment, sexual dysfunction;  Screening for signs and symptoms of depression related to chronic disease state;  Assessed social determinant of health barriers   Interaction and coordination with outside resources, practitioners, and providers See CCM Referral  Care Plan: Patient declined

## 2022-02-07 NOTE — Patient Instructions (Signed)
Please call the care guide team at [phone] if you need to cancel or reschedule your appointment.   If you are experiencing a Mental Health or [place] or need someone to talk to, please call the Suicide and Crisis Lifeline: 988 call the [place] National Suicide Prevention Lifeline: (920)018-9422 or TTY: 4630401716 TTY 6234452439) to talk to a trained counselor call 1-800-273-TALK (toll free, 24 hour hotline)   Following is a copy of your full provider care plan:   Goals Addressed             This Visit's Progress    CCM Expected Outcome:  Monitor, Self-Manage and Reduce Symptoms of Diabetes       Current Barriers:  Knowledge Deficits related to the need to check blood sugars and keep blood sugars in normal range Care Coordination needs related to the importance of monitoring blood sugars, eating healthy, maintaining good control of DM in a patient with DM Chronic Disease Management support and education needs related to effective management of DM Literacy barriers  Lab Results  Component Value Date   HGBA1C 10.0 (H) 10/27/2021     Planned Interventions: Provided education to patient about basic DM disease process; Reviewed medications with patient and discussed importance of medication adherence;        Reviewed prescribed diet with patient heart healthy/ADA. The patient is doing better with ADA and options that will help with maintaining healthy blood sugars but having a hard time refraining from salt ; Counseled on importance of regular laboratory monitoring as prescribed;        Discussed plans with patient for ongoing care management follow up and provided patient with direct contact information for care management team;      Provided patient with written educational materials related to hypo and hyperglycemia and importance of correct treatment. The patient has had a couple falls due to his blood sugars getting down to 40's. The patients wife states he was  concerned about his blood sugars being too high so he has not been eating like he was supposed to. Education on safety and monitoring for changes in the way he feels;       Reviewed scheduled/upcoming provider appointments including: 03-23-2022 at 10 am;         Advised patient, providing education and rationale, to check cbg before meals and at bedtime and when you have symptoms of low or high blood sugar and record. The endocrinologist had given the patient a continuous glucose reader but he kept knocking it off of his arm. He does not wish to keep wearing this. Education on monitoring for changes and letting the provider know for acute changes.         call provider for findings outside established parameters;       Review of patient status, including review of consultants reports, relevant laboratory and other test results, and medications completed;       Advised patient to discuss changes in DM, questions, or concerns with provider;     Sees eye specialist in January. Is possibly going to have new surgery to help vision  Screening for signs and symptoms of depression related to chronic disease state;        Assessed social determinant of health barriers;         Symptom Management: Take medications as prescribed   Attend all scheduled provider appointments Call provider office for new concerns or questions  call the Suicide and Crisis Lifeline: 988 call the  [place] National Suicide Prevention Lifeline: (860)103-0150 or TTY: 2065744224 TTY 458-088-5396) to talk to a trained counselor call 1-800-273-TALK (toll free, 24 hour hotline) if experiencing a Mental Health or [place]  keep appointment with eye doctor check feet daily for cuts, sores or redness trim toenails straight across manage portion size wash and dry feet carefully every day wear comfortable, cotton socks wear comfortable, well-fitting shoes  Follow Up Plan: Telephone follow up appointment with care  management team member scheduled for: 03-15-2022 at 1 pm       CCM Expected Outcome:  Monitor, Self-Manage and Reduce Symptoms of: Asthma       Current Barriers:  Knowledge Deficits related to triggers that can cause exacerbation of his asthma Chronic Disease Management support and education needs related to effective management of Asthma  Planned Interventions: Provided patient with basic written and verbal Asthma education on self care/management/and exacerbation prevention; Advised patient to track and manage Asthma triggers;  Provided written and verbal instructions on pursed lip breathing and utilized returned demonstration as teach back; Provided instruction about proper use of medications used for management of Asthma including inhalers; Advised patient to self assesses Asthma action plan zone and make appointment with provider if in the yellow zone for 48 hours without improvement; Advised patient to engage in light exercise as tolerated 3-5 days a week to aid in the the management of Asthma; Provided education about and advised patient to utilize infection prevention strategies to reduce risk of respiratory infection; Discussed the importance of adequate rest and management of fatigue with Asthma; Screening for signs and symptoms of depression related to chronic disease state;  Assessed social determinant of health barriers;   Symptom Management: Take medications as prescribed   Attend all scheduled provider appointments Call provider office for new concerns or questions  call the Suicide and Crisis Lifeline: 988 call the [place] National Suicide Prevention Lifeline: 202-781-7899 or TTY: 6166212232 TTY 8183474724) to talk to a trained counselor call 1-800-273-TALK (toll free, 24 hour hotline) if experiencing a Mental Health or [place]   Follow Up Plan: Telephone follow up appointment with care management team member scheduled for: 03-15-2022 at 1 pm        CCM Expected Outcome:  Monitor, Self-Manage, and Reduce Symptoms of Hypertension       Current Barriers:  Knowledge Deficits related to the importance for following a heart healthy/ADA diet and the need to restrict sodium in dietary habits  Chronic Disease Management support and education needs related to effective management of HTN  BP Readings from Last 3 Encounters:  01/30/22 128/82  11/01/21 135/77  10/27/21 106/66     Planned Interventions: Evaluation of current treatment plan related to hypertension self management and patient's adherence to plan as established by provider;   Provided education to patient re: stroke prevention, s/s of heart attack and stroke; Reviewed prescribed diet heart healthy/ADA diet  Reviewed medications with patient and discussed importance of compliance;  Discussed plans with patient for ongoing care management follow up and provided patient with direct contact information for care management team; Advised patient, providing education and rationale, to monitor blood pressure daily and record, calling PCP for findings outside established parameters;  Reviewed scheduled/upcoming provider appointments including: 03-23-2022 with pcp Advised patient to discuss changes in blood pressures or heart health with provider; Provided education on prescribed diet heart healthy/ADA diet ;  Discussed complications of poorly controlled blood pressure such as heart disease, stroke, circulatory complications, vision complications, kidney  impairment, sexual dysfunction;  Screening for signs and symptoms of depression related to chronic disease state;  Assessed social determinant of health barriers;   Symptom Management: Take medications as prescribed   Attend all scheduled provider appointments Call provider office for new concerns or questions  call the Suicide and Crisis Lifeline: 988 call the [place] National Suicide Prevention Lifeline: 713-482-6492 or TTY: (660)852-6438 TTY  [phone]) to talk to a trained counselor call 1-800-273-TALK (toll free, 24 hour hotline) if experiencing a Mental Health or [place]  check blood pressure 3 times per week learn about high blood pressure keep a blood pressure log take blood pressure log to all doctor appointments call doctor for signs and symptoms of high blood pressure develop an action plan for high blood pressure keep all doctor appointments take medications for blood pressure exactly as prescribed report new symptoms to your doctor  Follow Up Plan: Telephone follow up appointment with care management team member scheduled for: 03-15-2022 at 1 pm       COMPLETED: RNCM: Effective Management of DM       Care Coordination Interventions:Closing this goal and opening in new CCM   Lab Results  Component Value Date   HGBA1C 10.0 (H) 10/27/2021    Provided education to patient about basic DM disease process. 11-08-2021: Ongoing education and support for effective management of DM. The patient needs constant reminders of DM management and how to effectively manage. Extensive review and education today with the patients wife. The patients wife assist the patient with management of DM, dietary changes, and helping with appointments.  Reviewed medications with patient and discussed importance of medication adherence. 11-08-2021: The patient has started the Januvia and also switched from Atorvastatin to Rosuvastatin  Counseled on importance of regular laboratory monitoring as prescribed. 11-08-2021: The patient has regular lab testing. Discussed plans with patient for ongoing care management follow up and provided patient with direct contact information for care management team Provided patient with written educational materials related to hypo and hyperglycemia and importance of correct treatment Reviewed scheduled/upcoming provider appointments including: 01-29-2022 at 240 pm Advised patient, providing  education and rationale, to check cbg BID and record, calling pcp for findings outside established parameters 11-04-2021 225  11-05-2021 161  11-06-2021 212 am  11-06-2021 180 pm  11-07-2021 196 am  11-07-2021 133 pm  11-08-2021 244  Review of patient status, including review of consultants reports, relevant laboratory and other test results, and medications completed Screening for signs and symptoms of depression related to chronic disease state  Assessed social determinant of health barriers Having issues with his eyes. 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   Telephone follow up appointment with care management team member scheduled for: 03-15-2022 at 1 pm

## 2022-02-07 NOTE — Chronic Care Management (AMB) (Signed)
Chronic Care Management   CCM RN Visit Note  02/07/2022 Name: [name]. MRN: [id number] DOB: 02-04-1961  Subjective: [name]. is a 61 y.o. year old male who is a primary care patient of Cannady, Barbaraann Faster, NP. The patient was referred to the Chronic Care Management team for assistance with care management needs subsequent to provider initiation of CCM services and plan of care.    Today's Visit:  Engaged with patient by telephone. Also spoke to the patients wife, who is DRP for initial visit.     SDOH Interventions Today    Flowsheet Row Most Recent Value  SDOH Interventions   Food Insecurity Interventions Other (Comment)  [uses local food banks]  Housing Interventions Intervention Not Indicated  Transportation Interventions Intervention Not Indicated  Utilities Interventions Intervention Not Indicated  Alcohol Usage Interventions Intervention Not Indicated (Score <7)  Financial Strain Interventions Other (Comment)  [open to resources to help with bills and needs met]  Physical Activity Interventions Other (Comments)  [encouraged activity, does not do structured activity]  Stress Interventions Other (Comment)  [has multiple chronic conditions that impact his care]  Social Connections Interventions Intervention Not Indicated, Other (Comment)  [has good family support system]         Goals Addressed             This Visit's Progress    CCM Expected Outcome:  Monitor, Self-Manage and Reduce Symptoms of Diabetes       Current Barriers:  Knowledge Deficits related to the need to check blood sugars and keep blood sugars in normal range Care Coordination needs related to the importance of monitoring blood sugars, eating healthy, maintaining good control of DM in a patient with DM Chronic Disease Management support and education needs related to effective management of DM Literacy barriers  Lab Results  Component Value Date   HGBA1C 10.0 (H) 10/27/2021      Planned Interventions: Provided education to patient about basic DM disease process; Reviewed medications with patient and discussed importance of medication adherence;        Reviewed prescribed diet with patient heart healthy/ADA. The patient is doing better with ADA and options that will help with maintaining healthy blood sugars but having a hard time refraining from salt ; Counseled on importance of regular laboratory monitoring as prescribed;        Discussed plans with patient for ongoing care management follow up and provided patient with direct contact information for care management team;      Provided patient with written educational materials related to hypo and hyperglycemia and importance of correct treatment. The patient has had a couple falls due to his blood sugars getting down to 40's. The patients wife states he was concerned about his blood sugars being too high so he has not been eating like he was supposed to. Education on safety and monitoring for changes in the way he feels;       Reviewed scheduled/upcoming provider appointments including: 03-23-2022 at 10 am;         Advised patient, providing education and rationale, to check cbg before meals and at bedtime and when you have symptoms of low or high blood sugar and record. The endocrinologist had given the patient a continuous glucose reader but he kept knocking it off of his arm. He does not wish to keep wearing this. Education on monitoring for changes and letting the provider know for acute changes.  call provider for findings outside established parameters;       Review of patient status, including review of consultants reports, relevant laboratory and other test results, and medications completed;       Advised patient to discuss changes in DM, questions, or concerns with provider;     Sees eye specialist in January. Is possibly going to have new surgery to help vision  Screening for signs and symptoms of  depression related to chronic disease state;        Assessed social determinant of health barriers;         Symptom Management: Take medications as prescribed   Attend all scheduled provider appointments Call provider office for new concerns or questions  call the Suicide and Crisis Lifeline: 988 call the [place] National Suicide Prevention Lifeline: 972-642-4170 or TTY: (714)491-1203 TTY [phone]) to talk to a trained counselor call 1-800-273-TALK (toll free, 24 hour hotline) if experiencing a Mental Health or [place]  keep appointment with eye doctor check feet daily for cuts, sores or redness trim toenails straight across manage portion size wash and dry feet carefully every day wear comfortable, cotton socks wear comfortable, well-fitting shoes  Follow Up Plan: Telephone follow up appointment with care management team member scheduled for: 03-15-2022 at 1 pm       CCM Expected Outcome:  Monitor, Self-Manage and Reduce Symptoms of: Asthma       Current Barriers:  Knowledge Deficits related to triggers that can cause exacerbation of his asthma Chronic Disease Management support and education needs related to effective management of Asthma  Planned Interventions: Provided patient with basic written and verbal Asthma education on self care/management/and exacerbation prevention; Advised patient to track and manage Asthma triggers;  Provided written and verbal instructions on pursed lip breathing and utilized returned demonstration as teach back; Provided instruction about proper use of medications used for management of Asthma including inhalers; Advised patient to self assesses Asthma action plan zone and make appointment with provider if in the yellow zone for 48 hours without improvement; Advised patient to engage in light exercise as tolerated 3-5 days a week to aid in the the management of Asthma; Provided education about and advised patient to utilize  infection prevention strategies to reduce risk of respiratory infection; Discussed the importance of adequate rest and management of fatigue with Asthma; Screening for signs and symptoms of depression related to chronic disease state;  Assessed social determinant of health barriers;   Symptom Management: Take medications as prescribed   Attend all scheduled provider appointments Call provider office for new concerns or questions  call the Suicide and Crisis Lifeline: 988 call the [place] National Suicide Prevention Lifeline: 7871906729 or TTY: 6474761501 TTY [phone]) to talk to a trained counselor call 1-800-273-TALK (toll free, 24 hour hotline) if experiencing a Mental Health or [place]   Follow Up Plan: Telephone follow up appointment with care management team member scheduled for: 03-15-2022 at 1 pm       CCM Expected Outcome:  Monitor, Self-Manage, and Reduce Symptoms of Hypertension       Current Barriers:  Knowledge Deficits related to the importance for following a heart healthy/ADA diet and the need to restrict sodium in dietary habits  Chronic Disease Management support and education needs related to effective management of HTN  BP Readings from Last 3 Encounters:  01/30/22 128/82  11/01/21 135/77  10/27/21 106/66     Planned Interventions: Evaluation of current treatment plan related to hypertension  self management and patient's adherence to plan as established by provider;   Provided education to patient re: stroke prevention, s/s of heart attack and stroke; Reviewed prescribed diet heart healthy/ADA diet  Reviewed medications with patient and discussed importance of compliance;  Discussed plans with patient for ongoing care management follow up and provided patient with direct contact information for care management team; Advised patient, providing education and rationale, to monitor blood pressure daily and record, calling PCP for findings  outside established parameters;  Reviewed scheduled/upcoming provider appointments including: 03-23-2022 with pcp Advised patient to discuss changes in blood pressures or heart health with provider; Provided education on prescribed diet heart healthy/ADA diet ;  Discussed complications of poorly controlled blood pressure such as heart disease, stroke, circulatory complications, vision complications, kidney impairment, sexual dysfunction;  Screening for signs and symptoms of depression related to chronic disease state;  Assessed social determinant of health barriers;   Symptom Management: Take medications as prescribed   Attend all scheduled provider appointments Call provider office for new concerns or questions  call the Suicide and Crisis Lifeline: 988 call the [place] National Suicide Prevention Lifeline: 678-579-4813 or TTY: [phone] TTY (210)142-1521) to talk to a trained counselor call 1-800-273-TALK (toll free, 24 hour hotline) if experiencing a Mental Health or [place]  check blood pressure 3 times per week learn about high blood pressure keep a blood pressure log take blood pressure log to all doctor appointments call doctor for signs and symptoms of high blood pressure develop an action plan for high blood pressure keep all doctor appointments take medications for blood pressure exactly as prescribed report new symptoms to your doctor  Follow Up Plan: Telephone follow up appointment with care management team member scheduled for: 03-15-2022 at 1 pm       COMPLETED: RNCM: Effective Management of DM       Care Coordination Interventions:Closing this goal and opening in new CCM   Lab Results  Component Value Date   HGBA1C 10.0 (H) 10/27/2021    Provided education to patient about basic DM disease process. 11-08-2021: Ongoing education and support for effective management of DM. The patient needs constant reminders of DM management and how to effectively  manage. Extensive review and education today with the patients wife. The patients wife assist the patient with management of DM, dietary changes, and helping with appointments.  Reviewed medications with patient and discussed importance of medication adherence. 11-08-2021: The patient has started the Januvia and also switched from Atorvastatin to Rosuvastatin  Counseled on importance of regular laboratory monitoring as prescribed. 11-08-2021: The patient has regular lab testing. Discussed plans with patient for ongoing care management follow up and provided patient with direct contact information for care management team Provided patient with written educational materials related to hypo and hyperglycemia and importance of correct treatment Reviewed scheduled/upcoming provider appointments including: 01-29-2022 at 240 pm Advised patient, providing education and rationale, to check cbg BID and record, calling pcp for findings outside established parameters 11-04-2021 225  11-05-2021 161  11-06-2021 212 am  11-06-2021 180 pm  11-07-2021 196 am  11-07-2021 133 pm  11-08-2021 244  Review of patient status, including review of consultants reports, relevant laboratory and other test results, and medications completed Screening for signs and symptoms of depression related to chronic disease state  Assessed social determinant of health barriers Having issues with his eyes. 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

## 2022-02-12 ENCOUNTER — Encounter: Payer: Medicare Other | Admitting: *Deleted

## 2022-02-12 ENCOUNTER — Encounter: Payer: Self-pay | Admitting: *Deleted

## 2022-02-15 DIAGNOSIS — J45909 Unspecified asthma, uncomplicated: Secondary | ICD-10-CM | POA: Diagnosis not present

## 2022-02-15 DIAGNOSIS — I10 Essential (primary) hypertension: Secondary | ICD-10-CM

## 2022-02-15 DIAGNOSIS — E1159 Type 2 diabetes mellitus with other circulatory complications: Secondary | ICD-10-CM

## 2022-02-23 ENCOUNTER — Other Ambulatory Visit: Payer: Self-pay | Admitting: Nurse Practitioner

## 2022-02-27 ENCOUNTER — Ambulatory Visit: Payer: Medicare Other

## 2022-03-07 DIAGNOSIS — I10 Essential (primary) hypertension: Secondary | ICD-10-CM | POA: Diagnosis not present

## 2022-03-07 DIAGNOSIS — E7849 Other hyperlipidemia: Secondary | ICD-10-CM | POA: Diagnosis not present

## 2022-03-07 DIAGNOSIS — N183 Chronic kidney disease, stage 3 unspecified: Secondary | ICD-10-CM | POA: Diagnosis not present

## 2022-03-07 DIAGNOSIS — K219 Gastro-esophageal reflux disease without esophagitis: Secondary | ICD-10-CM | POA: Diagnosis not present

## 2022-03-07 DIAGNOSIS — E1165 Type 2 diabetes mellitus with hyperglycemia: Secondary | ICD-10-CM | POA: Diagnosis not present

## 2022-03-15 ENCOUNTER — Telehealth: Payer: Medicare Other

## 2022-03-23 ENCOUNTER — Encounter: Payer: Self-pay | Admitting: Nurse Practitioner

## 2022-03-23 ENCOUNTER — Ambulatory Visit (INDEPENDENT_AMBULATORY_CARE_PROVIDER_SITE_OTHER): Payer: Medicare Other | Admitting: Nurse Practitioner

## 2022-03-23 VITALS — BP 128/80 | HR 58 | Temp 97.3°F | Ht 64.02 in | Wt 172.7 lb

## 2022-03-23 DIAGNOSIS — E1159 Type 2 diabetes mellitus with other circulatory complications: Secondary | ICD-10-CM

## 2022-03-23 DIAGNOSIS — E1169 Type 2 diabetes mellitus with other specified complication: Secondary | ICD-10-CM | POA: Diagnosis not present

## 2022-03-23 DIAGNOSIS — J4540 Moderate persistent asthma, uncomplicated: Secondary | ICD-10-CM

## 2022-03-23 DIAGNOSIS — K824 Cholesterolosis of gallbladder: Secondary | ICD-10-CM

## 2022-03-23 DIAGNOSIS — Z23 Encounter for immunization: Secondary | ICD-10-CM | POA: Diagnosis not present

## 2022-03-23 DIAGNOSIS — E785 Hyperlipidemia, unspecified: Secondary | ICD-10-CM

## 2022-03-23 DIAGNOSIS — E1165 Type 2 diabetes mellitus with hyperglycemia: Secondary | ICD-10-CM | POA: Diagnosis not present

## 2022-03-23 DIAGNOSIS — I152 Hypertension secondary to endocrine disorders: Secondary | ICD-10-CM | POA: Diagnosis not present

## 2022-03-23 DIAGNOSIS — R809 Proteinuria, unspecified: Secondary | ICD-10-CM

## 2022-03-23 DIAGNOSIS — K760 Fatty (change of) liver, not elsewhere classified: Secondary | ICD-10-CM

## 2022-03-23 DIAGNOSIS — E701 Other hyperphenylalaninemias: Secondary | ICD-10-CM

## 2022-03-23 DIAGNOSIS — E1129 Type 2 diabetes mellitus with other diabetic kidney complication: Secondary | ICD-10-CM

## 2022-03-23 DIAGNOSIS — Z6830 Body mass index (BMI) 30.0-30.9, adult: Secondary | ICD-10-CM

## 2022-03-23 DIAGNOSIS — I6523 Occlusion and stenosis of bilateral carotid arteries: Secondary | ICD-10-CM

## 2022-03-23 DIAGNOSIS — Z8673 Personal history of transient ischemic attack (TIA), and cerebral infarction without residual deficits: Secondary | ICD-10-CM

## 2022-03-23 DIAGNOSIS — E559 Vitamin D deficiency, unspecified: Secondary | ICD-10-CM | POA: Diagnosis not present

## 2022-03-23 DIAGNOSIS — R011 Cardiac murmur, unspecified: Secondary | ICD-10-CM

## 2022-03-23 DIAGNOSIS — E6609 Other obesity due to excess calories: Secondary | ICD-10-CM

## 2022-03-23 DIAGNOSIS — K219 Gastro-esophageal reflux disease without esophagitis: Secondary | ICD-10-CM

## 2022-03-23 LAB — BAYER DCA HB A1C WAIVED: HB A1C (BAYER DCA - WAIVED): 9.9 % — ABNORMAL HIGH (ref 4.8–5.6)

## 2022-03-23 LAB — MICROALBUMIN, URINE WAIVED
Creatinine, Urine Waived: 50 mg/dL (ref 10–300)
Microalb, Ur Waived: 150 mg/L — ABNORMAL HIGH (ref 0–19)
Microalb/Creat Ratio: >300 mg/g — ABNORMAL HIGH (ref <30)

## 2022-03-23 NOTE — Assessment & Plan Note (Signed)
Continue collaboration with neurology and continue statin + ASA/Plavix.  Discussed goals BP <130/90, A1C < 6.5%, and LDL <70.  He is poorly controlled with diabetes, much related to knowledge base and poor diet.  Continue collaboration with endo and neuro.

## 2022-03-23 NOTE — Assessment & Plan Note (Signed)
Ongoing, is followed by cardiology and vascular at this time.  Continue collaboration with both and current statin and ASA.

## 2022-03-23 NOTE — Assessment & Plan Note (Signed)
Gallstones noted by GI recently.  He is having RUQ pain, referral to general surgery.

## 2022-03-23 NOTE — Progress Notes (Signed)
BP 128/80 (BP Location: Left Arm, Patient Position: Sitting)   Pulse (!) 58   Temp (!) 97.3 F (36.3 C) (Oral)   Ht 5' 4.02" (1.626 m)   Wt 172 lb 11.2 oz (78.3 kg)   SpO2 96%   BMI 29.63 kg/m    Subjective:    Patient ID: Alex Metro., male    DOB: 05-19-1960, 62 y.o.   MRN: [id number]  HPI: Alex Forgette. is a 62 y.o. male  Chief Complaint  Patient presents with   Diabetes   Hypertension   Hyperlipidemia   Asthma   Gastroesophageal Reflux   Chronic Kidney Disease   His wife is not present with him in room today, she provides more accurate history.  DIABETES & PHENYLKETONURIA A1c 10% in August 2023. Seen by endocrinology (Dr. Ronnald Collum) last on 01/08/22.  Insulin changes were made and Dexcom discussed.  Taking long acting insulin and sliding scale, however on review of note see discussion of insulin pump due to patient ongoing elevated A1c and inconsistent administration of medication.  He reports he has the pump, but they have to get a hold of someone in [place] so they can put it in and have had no contact with her.  Continues Jardiance, Januvia, and Lyumjev + sliding scale.  He is working on diet changes with his wife.  Has had phenylketonuria since childhood and monitors diet best he can per his report.  He has been cutting back on sodas.  Has tried Metformin in past x 2 both times AKI presented, history of pancreatitis and can not take GLP1.  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 Dr. Humphrey Rolls for cardiology, has not seen in awhile, issues with copay and can not see him yet he reports -- they want to do a stress test he reports.    Saw Dr. Melrose Nakayama on 11/09/19 -- he is to continue Plavix and ASA per their note.  Saw vascular on 07/24/21 -- is scheduled for doppler imaging on 11/01/22.  Current medications Lisinopril, Rosuvastatin, Fenofibrate, Vascepa, NTG.   Satisfied with current treatment? yes Duration of hypertension: chronic BP monitoring frequency: not checking BP range:  BP medication side effects: no Duration of hyperlipidemia: chronic Cholesterol medication side effects: no Cholesterol supplements: none Medication compliance: good compliance Aspirin: yes Recent stressors: no Recurrent headaches: no Visual changes: no Palpitations: no Dyspnea: no Chest pain: no Lower extremity edema: no Dizzy/lightheaded: no  PROTEINURIA Last visit with nephrology on 01/31/22. Proteinuria and CKD Stage II.  Recent nephro labs 01/22/22 CRT 0.99, eGFR 88. CKD status: stable Medications renally dose: yes Previous renal evaluation: yes Pneumovax:  Up to Date Influenza Vaccine:  Up to Date    GERD  Continues on Omeprazole 20 MG daily.  Had gastric emptying test on 01/12/22 = this was normal -- he reports he was told his gall bladder has gallstones in it.  He reports he has been have pain to RUQ and diarrhea -- the diarrhea just started this  morning.  The RUQ pain has been present since Christmas.  Is scheduled to see Dr. Marius Ditch on February 5th but would like to see sooner due to recent pain. GERD control status: stable Satisfied with current treatment? yes Heartburn frequency: occasional Medication side effects: no  Medication compliance: stable Previous GERD medications: Antacid use frequency:  none Dysphagia: no Odynophagia:  no Hematemesis: no Blood in stool: no EGD: yes   Relevant past medical,  surgical, family and social history reviewed and updated as indicated. Interim medical history since our last visit reviewed. Allergies and medications reviewed and updated.  Review of Systems  Constitutional:  Negative for activity change, diaphoresis, fatigue and fever.  Respiratory:  Negative for cough, chest tightness, shortness of breath and wheezing.   Cardiovascular:  Negative for chest pain, palpitations and leg swelling.  Gastrointestinal:  Positive for abdominal pain, diarrhea and nausea. Negative for abdominal distention, constipation and vomiting.  Endocrine: Negative for cold intolerance, heat intolerance, polydipsia, polyphagia and polyuria.  Neurological: Negative.   Psychiatric/Behavioral: Negative.     Per HPI unless specifically indicated above     Objective:    BP 128/80 (BP Location: Left Arm, Patient Position: Sitting)   Pulse (!) 58   Temp (!) 97.3 F (36.3 C) (Oral)   Ht 5' 4.02" (1.626 m)   Wt 172 lb 11.2 oz (78.3 kg)   SpO2 96%   BMI 29.63 kg/m   Wt Readings from Last 3 Encounters:  03/23/22 172 lb 11.2 oz (78.3 kg)  01/30/22 172 lb 1.6 oz (78.1 kg)  11/01/21 174 lb 9.6 oz (79.2 kg)    Physical Exam Vitals and nursing note reviewed.  Constitutional:      General: He is awake. He is not in acute distress.    Appearance: He is well-developed. He is obese. He is not ill-appearing.  HENT:     Head: Normocephalic and atraumatic.     Right Ear: Hearing normal. No drainage.     Left Ear: Hearing normal. No drainage.  Eyes:     General: Lids are normal.        Right eye: No discharge.        Left eye: No discharge.     Conjunctiva/sclera: Conjunctivae normal.     Pupils: Pupils are equal, round, and reactive to light.  Neck:     Thyroid: No thyromegaly.     Vascular: Carotid bruit (R>L) present.  Cardiovascular:     Rate and Rhythm: Normal rate and regular rhythm.     Heart sounds: S1 normal and S2 normal. Murmur heard.     Systolic murmur is  present with a grade of 2/6.     No gallop.     Comments: Systolic murmur noted best at left sternal border, soft. Pulmonary:     Effort: Pulmonary effort is normal. No accessory muscle usage or respiratory distress.     Breath sounds: Normal breath sounds.  Abdominal:     General: Bowel sounds are normal. There is no distension.     Palpations: Abdomen is soft.     Tenderness: There is abdominal tenderness in the right upper quadrant. There is no right CVA tenderness, left CVA tenderness, guarding or rebound. Positive signs include Murphy's sign.  Musculoskeletal:     Cervical back: Normal range of motion and neck supple.     Right lower leg: No edema.     Left lower leg: No edema.     Comments: Slight antalgic gait present.  Skin:    General: Skin is warm and dry.     Capillary Refill: Capillary refill takes less than 2 seconds.  Neurological:     Mental Status: He is alert and oriented to person, place, and time.     Deep Tendon Reflexes: Reflexes are normal and symmetric.  Psychiatric:        Attention and Perception: Attention normal.        Mood and Affect: Mood normal.        Speech: Speech normal.        Behavior: Behavior normal. Behavior is cooperative.    Results for orders placed or performed in visit on 01/30/22  Lipid Panel w/o Chol/HDL Ratio  Result Value Ref Range   Cholesterol, Total 277 (H) 100 - 199 mg/dL   Triglycerides 1,384 (HH) 0 - 149 mg/dL   HDL 20 (L) >39 mg/dL   VLDL Cholesterol Cal Comment (A) 5 - 40 mg/dL   LDL Chol Calc (NIH) Comment (A) 0 - 99 mg/dL  Vitamin B12  Result Value Ref Range   Vitamin B-12 312 232 - 1,245 pg/mL  VITAMIN D 25 Hydroxy (Vit-D Deficiency, Fractures)  Result Value Ref Range   Vit D, 25-Hydroxy 14.4 (L) 30.0 - 100.0 ng/mL  PSA  Result Value Ref Range   Prostate Specific Ag, Serum 0.3 0.0 - 4.0 ng/mL      Assessment & Plan:   Problem List Items Addressed This Visit       Cardiovascular and Mediastinum    Atherosclerosis of both carotid arteries    Ongoing, is followed by cardiology and vascular at this time.  Continue collaboration with both and current statin and ASA.      Hypertension associated with diabetes (HCC)    Chronic, ongoing.  Repeat BP at goal.  Will continue Lisinopril 10 MG dosing, further adjust next visit if remains on lower side.  Labs: CMP, TSH, urine ALB 150 January 2024.  Recommend they check his BP at home at least daily and document + focus on DASH diet.  Return to office in 3 months.      Relevant Orders   Bayer DCA Hb A1c Waived   Microalbumin, Urine Waived   TSH   Comprehensive metabolic panel     Digestive   Gall bladder polyp    Gallstones noted by GI recently.  He is having RUQ pain, referral to general surgery.      Relevant Orders   Ambulatory referral to General Surgery   GERD (gastroesophageal reflux disease)    Chronic, ongoing.  Continue Omeprazole daily and adjust as needed.  Mag level up to date.  Risks of PPI use were discussed with patient including bone loss, C. Diff diarrhea, pneumonia, infections, CKD, electrolyte abnormalities.  Verbalizes understanding and chooses to continue the medication.       NAFLD (nonalcoholic fatty liver disease)    Ongoing, recent CT scan reassuring.  Continue collaboration with GI provider.      Relevant Orders   Comprehensive metabolic panel   Ambulatory referral to General Surgery     Endocrine   Hyperlipidemia associated with type 2 diabetes mellitus (HCC)    Chronic, ongoing.  Continue Lipitor and Fenofibrate. Lipid panel today.  Adjust doses as needed, if LDL >70.      Relevant Orders   Bayer DCA Hb A1c Waived   Lipid Panel w/o Chol/HDL Ratio   Comprehensive metabolic panel   Type 2 diabetes mellitus with proteinuria (HCC) - Primary  Chronic, ongoing.  A1c 9.9% today with PCP -- do not have recent endo notes will work on obtaining.  Urine ALB 150 January 150.  - Continue Jardiance, Januvia, +  insulin as ordered by endo.  Recommend they reach out to endo to alert them to insulin pump needs.   - Poor tolerance to Metformin in past with AKI presenting x 2 trials and allergic to Sulfa.  Would avoid GLP at this time due to patient history of pancreatitis x 3, concern this would flare.  Educated patient at length on effect of diabetes from head to toe and increased risk for recurrent CVA due to poor control.   - Recommend they check his BS TID -- he would benefit from [facility] will work on this with CCM team in future.   - Goal A1C <6.5% due to CVA history -- which reiterated at length with patient today.  Continue collaboration with CCM team.   - Continue collaboration with endo, will obtain notes - Foot and eye exam up to date. - Return in 3 months.      Relevant Orders   Bayer DCA Hb A1c Waived   Microalbumin, Urine Waived   Comprehensive metabolic panel   Uncontrolled type 2 diabetes mellitus with hyperglycemia ([place])    Refer to diabetes with proteinuria plan of care.      Relevant Orders   Bayer DCA Hb A1c Waived   Microalbumin, Urine Waived   Comprehensive metabolic panel   CBC with Differential/Platelet     Other   Heart murmur    Grade 2/6 on auscultation, continue collaboration with cardiology.        History of CVA (cerebrovascular accident)    Continue collaboration with neurology and continue statin + ASA/Plavix.  Discussed goals BP <130/90, A1C < 6.5%, and LDL <70.  He is poorly controlled with diabetes, much related to knowledge base and poor diet.  Continue collaboration with endo and neuro.      Obesity    BMI 29.63  Recommended eating smaller high protein, low fat meals more frequently and exercising 30 mins a day 5 times a week with a goal of 10-15lb weight loss in the next 3 months. Patient voiced their understanding and motivation to adhere to these recommendations.       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      Vitamin D deficiency    Ongoing, stable.  Continue supplement and recheck level today.      Relevant Orders   VITAMIN D 25 Hydroxy (Vit-D Deficiency, Fractures)   Other Visit Diagnoses     Flu vaccine need       Flu vaccine in office today.   Relevant Orders   Flu Vaccine QUAD 6+ mos PF IM (Fluarix Quad PF) (Completed)        Follow up plan: Return in about 3 months (around 06/22/2022) for T2DM, HTN/HLD, GERD, ASTHMA.

## 2022-03-23 NOTE — Assessment & Plan Note (Signed)
Ongoing, recent CT scan reassuring.  Continue collaboration with GI provider.

## 2022-03-23 NOTE — Assessment & Plan Note (Signed)
Refer to diabetes with proteinuria plan of care.

## 2022-03-23 NOTE — Assessment & Plan Note (Signed)
Chronic, ongoing.  Continue Omeprazole daily and adjust as needed.  Mag level up to date.  Risks of PPI use were discussed with patient including bone loss, C. Diff diarrhea, pneumonia, infections, CKD, electrolyte abnormalities.  Verbalizes understanding and chooses to continue the medication.

## 2022-03-23 NOTE — Assessment & Plan Note (Signed)
Grade 2/6 on auscultation, continue collaboration with cardiology.

## 2022-03-23 NOTE — Assessment & Plan Note (Signed)
Ongoing, stable.  Continue supplement and recheck level today.

## 2022-03-23 NOTE — Assessment & Plan Note (Signed)
Chronic, ongoing.  A1c 9.9% today with PCP -- do not have recent endo notes will work on obtaining.  Urine ALB 150 January 150.  - Continue Jardiance, Januvia, + insulin as ordered by endo.  Recommend they reach out to endo to alert them to insulin pump needs.   - Poor tolerance to Metformin in past with AKI presenting x 2 trials and allergic to Sulfa.  Would avoid GLP at this time due to patient history of pancreatitis x 3, concern this would flare.  Educated patient at length on effect of diabetes from head to toe and increased risk for recurrent CVA due to poor control.   - Recommend they check his BS TID -- he would benefit from [facility] will work on this with CCM team in future.   - Goal A1C <6.5% due to CVA history -- which reiterated at length with patient today.  Continue collaboration with CCM team.   - Continue collaboration with endo, will obtain notes - Foot and eye exam up to date. - Return in 3 months.

## 2022-03-23 NOTE — Assessment & Plan Note (Signed)
BMI 29.63  Recommended eating smaller high protein, low fat meals more frequently and exercising 30 mins a day 5 times a week with a goal of 10-15lb weight loss in the next 3 months. Patient voiced their understanding and motivation to adhere to these recommendations.

## 2022-03-23 NOTE — Assessment & Plan Note (Signed)
Chronic, ongoing.  Continue Lipitor and Fenofibrate. Lipid panel today.  Adjust doses as needed, if LDL >70.

## 2022-03-23 NOTE — Assessment & Plan Note (Signed)
Mental delays present, continue to work with CCM team in [place] with education and reiterating diet needs.

## 2022-03-23 NOTE — Assessment & Plan Note (Signed)
Chronic, ongoing.  Repeat BP at goal.  Will continue Lisinopril 10 MG dosing, further adjust next visit if remains on lower side.  Labs: CMP, TSH, urine ALB 150 January 2024.  Recommend they check his BP at home at least daily and document + focus on DASH diet.  Return to office in 3 months.

## 2022-03-23 NOTE — Patient Instructions (Signed)
Diabetes Mellitus Basics  Diabetes mellitus, or diabetes, is a long-term (chronic) disease. It occurs when the body does not properly use sugar (glucose) that is released from food after you eat. Diabetes mellitus may be caused by one or both of these problems: Your pancreas does not make enough of a hormone called insulin. Your body does not react in a normal way to the insulin that it makes. Insulin lets glucose enter cells in your body. This gives you energy. If you have diabetes, glucose cannot get into cells. This causes high blood glucose (hyperglycemia). How to treat and manage diabetes You may need to take insulin or other diabetes medicines daily to keep your glucose in balance. If you are prescribed insulin, you will learn how to give yourself insulin by injection. You may need to adjust the amount of insulin you take based on the foods that you eat. You will need to check your blood glucose levels using a glucose monitor as told by your health care provider. The readings can help determine if you have low or high blood glucose. Generally, you should have these blood glucose levels: Before meals (preprandial): 80-130 mg/dL (4.4-7.2 mmol/L). After meals (postprandial): below 180 mg/dL (10 mmol/L). Hemoglobin A1c (HbA1c) level: less than 7%. Your health care provider will set treatment goals for you. Keep all follow-up visits. This is important. Follow these instructions at home: Diabetes medicines Take your diabetes medicines every day as told by your health care provider. List your diabetes medicines here: Name of medicine: ______________________________ Amount (dose): _______________ Time (a.m./p.m.): _______________ Notes: ___________________________________ Name of medicine: ______________________________ Amount (dose): _______________ Time (a.m./p.m.): _______________ Notes: ___________________________________ Name of medicine: ______________________________ Amount (dose):  _______________ Time (a.m./p.m.): _______________ Notes: ___________________________________ Insulin If you use insulin, list the types of insulin you use here: Insulin type: ______________________________ Amount (dose): _______________ Time (a.m./p.m.): _______________Notes: ___________________________________ Insulin type: ______________________________ Amount (dose): _______________ Time (a.m./p.m.): _______________ Notes: ___________________________________ Insulin type: ______________________________ Amount (dose): _______________ Time (a.m./p.m.): _______________ Notes: ___________________________________ Insulin type: ______________________________ Amount (dose): _______________ Time (a.m./p.m.): _______________ Notes: ___________________________________ Insulin type: ______________________________ Amount (dose): _______________ Time (a.m./p.m.): _______________ Notes: ___________________________________ Managing blood glucose  Check your blood glucose levels using a glucose monitor as told by your health care provider. Write down the times that you check your glucose levels here: Time: _______________ Notes: ___________________________________ Time: _______________ Notes: ___________________________________ Time: _______________ Notes: ___________________________________ Time: _______________ Notes: ___________________________________ Time: _______________ Notes: ___________________________________ Time: _______________ Notes: ___________________________________  Low blood glucose Low blood glucose (hypoglycemia) is when glucose is at or below 70 mg/dL (3.9 mmol/L). Symptoms may include: Feeling: Hungry. Sweaty and clammy. Irritable or easily upset. Dizzy. Sleepy. Having: A fast heartbeat. A headache. A change in your vision. Numbness around the mouth, lips, or tongue. Having trouble with: Moving (coordination). Sleeping. Treating low blood glucose To treat low blood  glucose, eat or drink something containing sugar right away. If you can think clearly and swallow safely, follow the 15:15 rule: Take 15 grams of a fast-acting carb (carbohydrate), as told by your health care provider. Some fast-acting carbs are: Glucose tablets: take 3-4 tablets. Hard candy: eat 3-5 pieces. Fruit juice: drink 4 oz (120 mL). Regular (not diet) soda: drink 4-6 oz (120-180 mL). Honey or sugar: eat 1 Tbsp (15 mL). Check your blood glucose levels 15 minutes after you take the carb. If your glucose is still at or below 70 mg/dL (3.9 mmol/L), take 15 grams of a carb again. If your glucose does not go above 70 mg/dL (3.9 mmol/L) after  3 tries, get help right away. After your glucose goes back to normal, eat a meal or a snack within 1 hour. Treating very low blood glucose If your glucose is at or below 54 mg/dL (3 mmol/L), you have very low blood glucose (severe hypoglycemia). This is an emergency. Do not wait to see if the symptoms will go away. Get medical help right away. Call your local emergency services (911 in the [place] Do not drive yourself to the hospital. Questions to ask your health care provider Should I talk with a diabetes educator? What equipment will I need to care for myself at home? What diabetes medicines do I need? When should I take them? How often do I need to check my blood glucose levels? What number can I call if I have questions? When is my follow-up visit? Where can I find a support group for people with diabetes? Where to find more information American Diabetes Association: www.diabetes.org Association of Diabetes Care and Education Specialists: www.diabeteseducator.org Contact a health care provider if: Your blood glucose is at or above 240 mg/dL (13.3 mmol/L) for 2 days in a row. You have been sick or have had a fever for 2 days or more, and you are not getting better. You have any of these problems for more than 6 hours: You cannot eat or  drink. You feel nauseous. You vomit. You have diarrhea. Get help right away if: Your blood glucose is lower than 54 mg/dL (3 mmol/L). You get confused. You have trouble thinking clearly. You have trouble breathing. These symptoms may represent a serious problem that is an emergency. Do not wait to see if the symptoms will go away. Get medical help right away. Call your local emergency services (911 in the [place] Do not drive yourself to the hospital. Summary Diabetes mellitus is a chronic disease that occurs when the body does not properly use sugar (glucose) that is released from food after you eat. Take insulin and diabetes medicines as told. Check your blood glucose every day, as often as told. Keep all follow-up visits. This is important. This information is not intended to replace advice given to you by your health care provider. Make sure you discuss any questions you have with your health care provider. Document Revised: 07/07/2019 Document Reviewed: 07/07/2019 Elsevier Patient Education  [place].

## 2022-03-24 LAB — CBC WITH DIFFERENTIAL/PLATELET
Basophils Absolute: 0 10*3/uL (ref 0.0–0.2)
Basos: 1 %
EOS (ABSOLUTE): 0.2 10*3/uL (ref 0.0–0.4)
Eos: 3 %
Hematocrit: 48.9 % (ref 37.5–51.0)
Hemoglobin: 15.9 g/dL (ref 13.0–17.7)
Immature Grans (Abs): 0 10*3/uL (ref 0.0–0.1)
Immature Granulocytes: 0 %
Lymphocytes Absolute: 1.2 10*3/uL (ref 0.7–3.1)
Lymphs: 23 %
MCH: 30.4 pg (ref 26.6–33.0)
MCHC: 32.5 g/dL (ref 31.5–35.7)
MCV: 94 fL (ref 79–97)
Monocytes Absolute: 0.4 10*3/uL (ref 0.1–0.9)
Monocytes: 8 %
Neutrophils Absolute: 3.4 10*3/uL (ref 1.4–7.0)
Neutrophils: 65 %
Platelets: 198 10*3/uL (ref 150–450)
RBC: 5.23 x10E6/uL (ref 4.14–5.80)
RDW: 13.3 % (ref 11.6–15.4)
WBC: 5.1 10*3/uL (ref 3.4–10.8)

## 2022-03-24 LAB — COMPREHENSIVE METABOLIC PANEL
ALT: 46 IU/L — ABNORMAL HIGH (ref 0–44)
AST: 32 IU/L (ref 0–40)
Albumin/Globulin Ratio: 1.9 (ref 1.2–2.2)
Albumin: 4.7 g/dL (ref 3.8–4.9)
Alkaline Phosphatase: 186 IU/L — ABNORMAL HIGH (ref 44–121)
BUN/Creatinine Ratio: 13 (ref 9–20)
BUN: 12 mg/dL (ref 6–24)
Bilirubin Total: 0.3 mg/dL (ref 0.0–1.2)
CO2: 21 mmol/L (ref 20–29)
Calcium: 9.4 mg/dL (ref 8.7–10.2)
Chloride: 99 mmol/L (ref 96–106)
Creatinine, Ser: 0.92 mg/dL (ref 0.76–1.27)
Globulin, Total: 2.5 g/dL (ref 1.5–4.5)
Glucose: 307 mg/dL — ABNORMAL HIGH (ref 70–99)
Potassium: 4.5 mmol/L (ref 3.5–5.2)
Sodium: 137 mmol/L (ref 134–144)
Total Protein: 7.2 g/dL (ref 6.0–8.5)
eGFR: 96 mL/min/{1.73_m2} (ref >59)

## 2022-03-24 LAB — VITAMIN D 25 HYDROXY (VIT D DEFICIENCY, FRACTURES): Vit D, 25-Hydroxy: 16.1 ng/mL — ABNORMAL LOW (ref 30.0–100.0)

## 2022-03-24 LAB — TSH: TSH: 1.07 u[IU]/mL (ref 0.450–4.500)

## 2022-03-24 LAB — LIPID PANEL W/O CHOL/HDL RATIO
Cholesterol, Total: 240 mg/dL — ABNORMAL HIGH (ref 100–199)
HDL: 30 mg/dL — ABNORMAL LOW (ref >39)
LDL Chol Calc (NIH): 97 mg/dL (ref 0–99)
Triglycerides: 665 mg/dL (ref 0–149)
VLDL Cholesterol Cal: 113 mg/dL — ABNORMAL HIGH (ref 5–40)

## 2022-03-26 ENCOUNTER — Other Ambulatory Visit: Payer: Self-pay | Admitting: Nurse Practitioner

## 2022-03-26 DIAGNOSIS — E1169 Type 2 diabetes mellitus with other specified complication: Secondary | ICD-10-CM

## 2022-03-26 DIAGNOSIS — E781 Pure hyperglyceridemia: Secondary | ICD-10-CM

## 2022-03-26 NOTE — Progress Notes (Signed)
Good morning, please call [name] to alert them to labs: - Cholesterol levels continue to show elevation in triglycerides.  I would like to check this with him fasting, meaning early morning before he eats and no food for at least 8-12 hours, which is why early morning is best.  Please schedule a lab only visit for him -- fasting. - Vitamin D level remains low, please ensure to take the Vitamin D supplement I sent in on a weekly basis. - Remainder of labs are overall stable with exception of elevation in sugar which we have discussed, please reach out to Dr. Ronnald Collum on attaching insulin pump.  Any questions? Keep being amazing!!  Thank you for allowing me to participate in your care.  I appreciate you. Kindest regards, Elihu Milstein

## 2022-03-28 ENCOUNTER — Other Ambulatory Visit: Payer: Medicare Other

## 2022-03-28 DIAGNOSIS — E781 Pure hyperglyceridemia: Secondary | ICD-10-CM

## 2022-03-28 DIAGNOSIS — E1169 Type 2 diabetes mellitus with other specified complication: Secondary | ICD-10-CM

## 2022-03-28 DIAGNOSIS — E785 Hyperlipidemia, unspecified: Secondary | ICD-10-CM | POA: Diagnosis not present

## 2022-03-29 ENCOUNTER — Ambulatory Visit: Payer: Self-pay | Admitting: Surgery

## 2022-03-29 ENCOUNTER — Other Ambulatory Visit: Payer: Medicare Other

## 2022-03-29 ENCOUNTER — Ambulatory Visit (INDEPENDENT_AMBULATORY_CARE_PROVIDER_SITE_OTHER): Payer: Medicare Other | Admitting: Surgery

## 2022-03-29 ENCOUNTER — Encounter: Payer: Self-pay | Admitting: Surgery

## 2022-03-29 ENCOUNTER — Telehealth: Payer: Self-pay | Admitting: Surgery

## 2022-03-29 VITALS — BP 170/82 | HR 66 | Temp 98.1°F | Ht 64.0 in | Wt 175.0 lb

## 2022-03-29 DIAGNOSIS — K8010 Calculus of gallbladder with chronic cholecystitis without obstruction: Secondary | ICD-10-CM

## 2022-03-29 DIAGNOSIS — Z9049 Acquired absence of other specified parts of digestive tract: Secondary | ICD-10-CM | POA: Insufficient documentation

## 2022-03-29 LAB — LIPID PANEL W/O CHOL/HDL RATIO
Cholesterol, Total: 193 mg/dL (ref 100–199)
HDL: 26 mg/dL — ABNORMAL LOW (ref >39)
LDL Chol Calc (NIH): 90 mg/dL (ref 0–99)
Triglycerides: 469 mg/dL — ABNORMAL HIGH (ref 0–149)
VLDL Cholesterol Cal: 77 mg/dL — ABNORMAL HIGH (ref 5–40)

## 2022-03-29 NOTE — Patient Instructions (Signed)
You will need to stop your Plavix for 5 days before surgery.   You have requested to have your gallbladder removed. This will be done at [facility] with Dr. Christian Mate.  You will most likely be out of work 1-2 weeks for this surgery.  If you have FMLA or disability paperwork that needs filled out you may drop this off at our office or this can be faxed to [phone].  You will return after your post-op appointment with a lifting restriction for approximately 4 more weeks.  You will be able to eat anything you would like to following surgery. But, start by eating a bland diet and advance this as tolerated. The Gallbladder diet is below, please go as closely by this diet as possible prior to surgery to avoid any further attacks.  Please see the (blue)pre-care form that you have been given today. Our surgery scheduler will call you to verify surgery date and to go over information.   If you have any questions, please call our office.  Laparoscopic Cholecystectomy Laparoscopic cholecystectomy is surgery to remove the gallbladder. The gallbladder is located in the upper right part of the abdomen, behind the liver. It is a storage sac for bile, which is produced in the liver. Bile aids in the digestion and absorption of fats. Cholecystectomy is often done for inflammation of the gallbladder (cholecystitis). This condition is usually caused by a buildup of gallstones (cholelithiasis) in the gallbladder. Gallstones can block the flow of bile, and that can result in inflammation and pain. In severe cases, emergency surgery may be required. If emergency surgery is not required, you will have time to prepare for the procedure. Laparoscopic surgery is an alternative to open surgery. Laparoscopic surgery has a shorter recovery time. Your common bile duct may also need to be examined during the procedure. If stones are found in the common bile duct, they may be removed. [facility] CARE PROVIDER  KNOW ABOUT: Any allergies you have. All medicines you are taking, including vitamins, herbs, eye drops, creams, and over-the-counter medicines. Previous problems you or members of your family have had with the use of anesthetics. Any blood disorders you have. Previous surgeries you have had.  Any medical conditions you have. RISKS AND COMPLICATIONS Generally, this is a safe procedure. However, problems may occur, including: Infection. Bleeding. Allergic reactions to medicines. Damage to other structures or organs. A stone remaining in the common bile duct. A bile leak from the cyst duct that is clipped when your gallbladder is removed. The need to convert to open surgery, which requires a larger incision in the abdomen. This may be necessary if your surgeon thinks that it is not safe to continue with a laparoscopic procedure. BEFORE THE PROCEDURE Ask your health care provider about: Changing or stopping your regular medicines. This is especially important if you are taking diabetes medicines or blood thinners. Taking medicines such as aspirin and ibuprofen. These medicines can thin your blood. Do not take these medicines before your procedure if your health care provider instructs you not to. Follow instructions from your health care provider about eating or drinking restrictions. Let your health care provider know if you develop a cold or an infection before surgery. Plan to have someone take you home after the procedure. Ask your health care provider how your surgical site will be marked or identified. You may be given antibiotic medicine to help prevent infection. PROCEDURE To reduce your risk of infection: Your health care team will  wash or sanitize their hands. Your skin will be washed with soap. An IV tube may be inserted into one of your veins. You will be given a medicine to make you fall asleep (general anesthetic). A breathing tube will be placed in your mouth. The surgeon  will make several small cuts (incisions) in your abdomen. A thin, lighted tube (laparoscope) that has a tiny camera on the end will be inserted through one of the small incisions. The camera on the laparoscope will send a picture to a TV screen (monitor) in the operating room. This will give the surgeon a good view inside your abdomen. A gas will be pumped into your abdomen. This will expand your abdomen to give the surgeon more room to perform the surgery. Other tools that are needed for the procedure will be inserted through the other incisions. The gallbladder will be removed through one of the incisions. After your gallbladder has been removed, the incisions will be closed with stitches (sutures), staples, or skin glue. Your incisions may be covered with a bandage (dressing). The procedure may vary among health care providers and hospitals. AFTER THE PROCEDURE Your blood pressure, heart rate, breathing rate, and blood oxygen level will be monitored often until the medicines you were given have worn off. You will be given medicines as needed to control your pain.   This information is not intended to replace advice given to you by your health care provider. Make sure you discuss any questions you have with your health care provider.   Document Released: 03/05/2005 Document Revised: 11/24/2014 Document Reviewed: 10/15/2012 Elsevier Interactive Patient Education 2016 [employer] Diet for Gallbladder Conditions A low-fat diet can be helpful if you have pancreatitis or a gallbladder condition. With these conditions, your pancreas and gallbladder have trouble digesting fats. A healthy eating plan with less fat will help rest your pancreas and gallbladder and reduce your symptoms. WHAT DO I NEED TO KNOW ABOUT THIS DIET? Eat a low-fat diet. Reduce your fat intake to less than 20-30% of your total daily calories. This is less than 50-60 g of fat per day. Remember that you need some fat  in your diet. Ask your dietician what your daily goal should be. Choose nonfat and low-fat healthy foods. Look for the words "nonfat," "low fat," or "fat free." As a guide, look on the label and choose foods with less than 3 g of fat per serving. Eat only one serving. Avoid alcohol. Do not smoke. If you need help quitting, talk with your health care provider. Eat small frequent meals instead of three large heavy meals. WHAT FOODS CAN I EAT? Grains Include healthy grains and starches such as potatoes, wheat bread, fiber-rich cereal, and brown rice. Choose whole grain options whenever possible. In adults, whole grains should account for 45-65% of your daily calories.  Fruits and Vegetables Eat plenty of fruits and vegetables. Fresh fruits and vegetables add fiber to your diet. Meats and Other Protein Sources Eat lean meat such as chicken and pork. Trim any fat off of meat before cooking it. Eggs, fish, and beans are other sources of protein. In adults, these foods should account for 10-35% of your daily calories. Dairy Choose low-fat milk and dairy options. Dairy includes fat and protein, as well as calcium.  Fats and Oils Limit high-fat foods such as fried foods, sweets, baked goods, sugary drinks.  Other Creamy sauces and condiments, such as mayonnaise, can add extra fat. Think about whether or  not you need to use them, or use smaller amounts or low fat options. WHAT FOODS ARE NOT RECOMMENDED? High fat foods, such as: [employer]. Ice cream. [place] toast. Sweet rolls. Pizza. Cheese bread. Foods covered with batter, butter, creamy sauces, or cheese. Fried foods. Sugary drinks and desserts. Foods that cause gas or bloating   This information is not intended to replace advice given to you by your health care provider. Make sure you discuss any questions you have with your health care provider.   Document Released: 03/10/2013 Document Reviewed: 03/10/2013 Elsevier Interactive Patient  Education [employer].

## 2022-03-29 NOTE — Progress Notes (Signed)
Please let Gregory and [name] know cholesterol labs look somewhat better this check, but still not at goal.  Next visit we will discuss changing his cholesterol medication to an injectable one that may work better for him.  Any questions?

## 2022-03-29 NOTE — H&P (View-Only) (Signed)
Patient ID: Alex Metro., male   DOB: 20-May-1960, 62 y.o.   MRN: [id number]  Chief Complaint: Right upper quadrant pain  History of Present Illness [name]. is a 62 y.o. male with history of gallbladder problems for years.  Seems to be exacerbated by eating spicy foods.  The pain does not radiate.  Denies nausea, reports loose stools.  Currently taking Plavix and aspirin.  Past Medical History Past Medical History:  Diagnosis Date   Arrhythmia    Asthma    Cyst of kidney, acquired    Diabetes mellitus without complication ([place]) 2706   type 2   Edentulous    Fatty liver    GERD (gastroesophageal reflux disease)    History of chicken pox    History of measles as a child    History of PKU    Hyperlipidemia    Hypertension    IBS (irritable bowel syndrome)    Irregular heart beat    Mentally challenged    Pancreatitis    Stroke ([place]) 03/02/2019   vision issues - right eye      Past Surgical History:  Procedure Laterality Date   Cardiac Catherization     [facility]   CARDIAC CATHETERIZATION     [facility]   CATARACT EXTRACTION W/PHACO Left 06/05/2021   Procedure: CATARACT EXTRACTION PHACO AND INTRAOCULAR LENS PLACEMENT ([place]) LEFT DIABETIC 3.75 00:28.0;  Surgeon: Eulogio Bear, MD;  Location: [place];  Service: Ophthalmology;  Laterality: Left;  Diabetic   CATARACT EXTRACTION W/PHACO Right 07/10/2021   Procedure: CATARACT EXTRACTION PHACO AND INTRAOCULAR LENS PLACEMENT ([facility]) RIGHT DIABETIC;  Surgeon: Eulogio Bear, MD;  Location: [place];  Service: Ophthalmology;  Laterality: Right;  Diabetic 2.66 00:24.4   COLONOSCOPY     COLONOSCOPY WITH PROPOFOL N/A 08/10/2020   Procedure: COLONOSCOPY WITH PROPOFOL;  Surgeon: Lin Landsman, MD;  Location: [facility] ENDOSCOPY;  Service: Gastroenterology;  Laterality: N/A;  Has ankle monitor; (Parole officer "Ms Andree Moro needs a time before Monday [phone]" - per patient's wife)    ESOPHAGOGASTRODUODENOSCOPY (EGD) WITH PROPOFOL N/A 12/27/2016   Procedure: ESOPHAGOGASTRODUODENOSCOPY (EGD) WITH PROPOFOL;  Surgeon: Lin Landsman, MD;  Location: [place];  Service: Gastroenterology;  Laterality: N/A;   HEMORRHOID SURGERY     Ligament Removal Left    of left thumb: dr. Cleda Mccreedy   ligament removal  of left thumb     Dr. Cleda Mccreedy   NM GATED MYOCARDIAL STUDY (ARMX HX)  06/23/2014   [place]. Normal    Allergies  Allergen Reactions   Bee Venom Hives and Swelling    All kinds of bees   Other     Certain powders   Sulfa Antibiotics     Current Outpatient Medications  Medication Sig Dispense Refill   aspirin 81 MG EC tablet Take 1 tablet (81 mg total) by mouth daily. 180 tablet 0   Blood Glucose Monitoring Suppl (ONE TOUCH ULTRA 2) w/Device KIT Use to check blood sugar 4 times a day 1 kit 0   BREO ELLIPTA 100-25 MCG/ACT AEPB INHALE 1 PUFF BY MOUTH ONCE DAILY 60 each 2   Cholecalciferol 1.25 MG (50000 UT) TABS Take 1 tablet by mouth once a week. 12 tablet 3   clopidogrel (PLAVIX) 75 MG tablet Take 1 tablet by mouth daily.     cyanocobalamin (VITAMIN B12) 1000 MCG tablet Take 1 tablet (1,000 mcg total) by mouth daily. 90 tablet 4   EPINEPHRINE 0.3 mg/0.3 mL IJ SOAJ  injection INJECT 1 SYRINGE INTO OUTER THIGH ONCE AS NEEDED FOR SEVERE ALLERGIC REACTION. 2 each 1   fenofibrate 54 MG tablet TAKE 1 TABLET BY MOUTH ONCE DAILY 90 tablet 3   gabapentin (NEURONTIN) 300 MG capsule TAKE 1 CAPSULE BY MOUTH TWICE DAILY 180 capsule 0   glucose blood (ONETOUCH ULTRA) test strip 1 each by Other route 5 (five) times daily. 300 each 3   icosapent Ethyl (VASCEPA) 1 g capsule Take 2 g by mouth 2 (two) times daily.     Insulin Disposable Pump (OMNIPOD DASH PDM, GEN 4,) KIT AS PRESCRIBED USE AS DIRECTED BASED ON TOTAL DAILY INSULIN AND POD CHANGE FREQUENCY     Insulin Pen Needle (PEN NEEDLES) 32G X 4 MM MISC 1 kit by Does not apply route daily. 100 each 4   JARDIANCE 25 MG TABS tablet  TAKE 1 TABLET BY MOUTH ONCE DAILY 90 tablet 1   Lancets (ONETOUCH DELICA PLUS [id number]) MISC USE AS DIRECTED. 300 each 3   lisinopril (ZESTRIL) 10 MG tablet Take 1 tablet (10 mg total) by mouth daily. 90 tablet 4   lisinopril (ZESTRIL) 20 MG tablet TAKE 1 TABLET BY MOUTH ONCE DAILY 90 tablet 0   LYUMJEV KWIKPEN 100 UNIT/ML KwikPen Inject into the skin. Per endocrinology directions     Misc. Devices (PULSE OXIMETER FOR FINGER) MISC To check O2 saturations once daily with asthma and document + check if any shortness of breath 1 each 1   montelukast (SINGULAIR) 10 MG tablet TAKE 1 TABLET BY MOUTH AT BEDTIME 90 tablet 3   moxifloxacin (VIGAMOX) 0.5 % ophthalmic solution Apply to eye.     nitroGLYCERIN (NITROSTAT) 0.4 MG SL tablet Place 1 tablet (0.4 mg total) under the tongue every 5 (five) minutes as needed for chest pain. 50 tablet 3   omeprazole (PRILOSEC) 20 MG capsule TAKE 1 CAPSULE BY MOUTH ONCE DAILY 90 capsule 0   rosuvastatin (CRESTOR) 40 MG tablet Take 1 tablet (40 mg total) by mouth daily. 90 tablet 4   sitaGLIPtin (JANUVIA) 100 MG tablet Take 1 tablet (100 mg total) by mouth daily. 90 tablet 4   tiZANidine (ZANAFLEX) 2 MG tablet Take 1 tablet (2 mg total) by mouth every 6 (six) hours as needed for muscle spasms. 30 tablet 1   triamcinolone cream (KENALOG) 0.1 % Apply 1 Application topically 2 (two) times daily. 30 g 1   trimethoprim-polymyxin b (POLYTRIM) ophthalmic solution Place 1 drop into the right eye every 6 (six) hours.      ULTRACARE PEN NEEDLES 32G X 5 MM MISC      valACYclovir (VALTREX) 1000 MG tablet Take 1,000 mg by mouth 3 (three) times daily.     No current facility-administered medications for this visit.    Family History Family History  Problem Relation Age of Onset   Cancer Mother        throat   Diabetes Father    Heart disease Father    Cancer Maternal Grandmother    Cancer Maternal Grandfather        pancreatic   Cancer Paternal Grandfather        Social History Social History   Tobacco Use   Smoking status: Never    Passive exposure: Never   Smokeless tobacco: Never  Vaping Use   Vaping Use: Never used  Substance Use Topics   Alcohol use: No    Alcohol/week: 0.0 standard drinks of alcohol   Drug use: No  Review of Systems  Constitutional: Negative.   HENT: Negative.    Eyes:  Positive for double vision.  Respiratory: Negative.    Cardiovascular: Negative.   Gastrointestinal:  Positive for abdominal pain.  Genitourinary: Negative.   Skin: Negative.   Neurological:  Positive for tingling.  Psychiatric/Behavioral: Negative.       Physical Exam Blood pressure (!) 170/82, pulse 66, temperature 98.1 F (36.7 C), height 5' 4" (1.626 m), weight 175 lb (79.4 kg), SpO2 98 %. Last Weight  Most recent update: 03/29/2022  8:47 AM    Weight  79.4 kg (175 lb)             CONSTITUTIONAL: Well developed, and nourished, appropriately responsive and aware without distress.   EYES: Sclera non-icteric.   EARS, NOSE, MOUTH AND THROAT:  The oropharynx is clear. Oral mucosa is pink and moist.   Hearing is intact to voice.  NECK: Trachea is midline, and there is no jugular venous distension.  LYMPH NODES:  Lymph nodes in the neck are not appreciated. RESPIRATORY:  Lungs are clear, and breath sounds are equal bilaterally. Normal respiratory effort without pathologic use of accessory muscles. CARDIOVASCULAR: Heart is regular in rate and rhythm.  Well perfused.  GI: The abdomen is notable for minimal subcutaneous adipose, otherwise soft, nontender, and nondistended.  There is some right upper quadrant tenderness and it is difficult to distract him.  There were no palpable masses.  MUSCULOSKELETAL:  Symmetrical muscle tone appreciated in all four extremities.    SKIN: Skin turgor is normal. No pathologic skin lesions appreciated.  NEUROLOGIC:  Motor and sensation appear grossly normal.  Cranial nerves are grossly without  defect. PSYCH:  Alert and oriented to person, place and time. Affect is appropriate for situation.  Data Reviewed I have personally reviewed what is currently available of the patient's imaging, recent labs and medical records.   Labs:     Latest Ref Rng & Units 03/23/2022   10:10 AM 04/26/2021    2:25 PM 01/18/2021    2:31 PM  CBC  WBC 3.4 - 10.8 x10E3/uL 5.1  4.8  5.0   Hemoglobin 13.0 - 17.7 g/dL 15.9  14.6  13.4   Hematocrit 37.5 - 51.0 % 48.9  43.9  42.6   Platelets 150 - 450 x10E3/uL 198  174  176       Latest Ref Rng & Units 03/23/2022   10:10 AM 10/27/2021    4:06 PM 07/26/2021    3:22 PM  CMP  Glucose 70 - 99 mg/dL 307  295  322   BUN 6 - 24 mg/dL _0 Creatinine 0.76 - 1.27 mg/dL 0.92  1.27  1.02   Sodium 134 - 144 mmol/L 137  135  138   Potassium 3.5 - 5.2 mmol/L 4.5  4.1  4.2   Chloride 96 - 106 mmol/L 99  101  103   CO2 20 - 29 mmol/L _1 Calcium 8.7 - 10.2 mg/dL 9.4  9.4  9.0   Total Protein 6.0 - 8.5 g/dL 7.2  6.6    Total Bilirubin 0.0 - 1.2 mg/dL 0.3  0.3    Alkaline Phos 44 - 121 IU/L 186  152    AST 0 - 40 IU/L 32  24    ALT 0 - 44 IU/L 46  26       Imaging: Radiological images reviewed:  CLINICAL DATA:  Abdominal pain for  3 months   EXAM: ULTRASOUND ABDOMEN LIMITED RIGHT UPPER QUADRANT   COMPARISON:  CT abdomen pelvis 04/15/2021   FINDINGS: Gallbladder:   Small stone in the gallbladder neck measuring 4 mm. No gallbladder wall thickening or pericholecystic fluid. Negative sonographic Murphy's sign.   Common bile duct:   Diameter: 2 mm   Liver:   Increased echogenicity. No focal lesion. Portal vein is patent on color Doppler imaging with normal direction of blood flow towards the liver.   Other: None.   IMPRESSION: 1. Cholelithiasis without secondary signs of acute cholecystitis. 2. Increased hepatic parenchymal echogenicity suggestive of steatosis.     Electronically Signed   By: Lovey Newcomer M.D.   On: 01/12/2022  08:21 Within last 24 hrs: No results found.  Assessment     Patient Active Problem List   Diagnosis Date Noted   CCC (chronic calculous cholecystitis) 03/29/2022   Right hip pain 05/24/2021   B12 deficiency 04/26/2021   Heart murmur 09/21/2020   Type 2 diabetes mellitus with proteinuria ([place]) 03/25/2019   Atherosclerosis of both carotid arteries 03/22/2019   History of CVA (cerebrovascular accident) 03/06/2019   Uncontrolled type 2 diabetes mellitus with hyperglycemia ([place])    Obesity 09/03/2018   Frequent falls 05/15/2016   Vitamin D deficiency 08/22/2015   Carpal tunnel syndrome on left 05/12/2015   External hemorrhoids 12/20/2014   Allergic rhinitis 10/21/2014   Diverticulosis 10/21/2014   Hypertension associated with diabetes ([place]) 10/21/2014   Hyperlipidemia associated with type 2 diabetes mellitus ([place]) 10/21/2014   Asthma 10/21/2014   GERD (gastroesophageal reflux disease) 10/21/2014   NAFLD (nonalcoholic fatty liver disease) 10/21/2014   Arthritis 10/21/2014   History of pancreatitis 10/07/2014   Phenylketonuria (PKU) ([place]) 10/07/2014   Renal cyst, left 10/07/2014   Gall bladder polyp 10/01/2013    Plan    This was discussed thoroughly.  Optimal plan is for robotic cholecystectomy utilizing ICG imaging.  We will hold his Plavix, and schedule him for Monday.  Risks and benefits have been discussed with the patient which include but are not limited to anesthesia, bleeding, infection, biliary ductal injury, resulting in leak or stenosis, other associated unanticipated injuries affiliated with laparoscopic surgery.   Reviewed that removing the gallbladder will only address the symptoms related to the gallbladder itself.  I believe there is the desire to proceed, accepting the risks with understanding.  Questions elicited and answered to satisfaction.    No guarantees ever expressed or implied.   Face-to-face time spent with the patient and accompanying care  providers(if present) was 45 minutes, with more than 50% of the time spent counseling, educating, and coordinating care of the patient.    These notes generated with voice recognition software. I apologize for typographical errors.  Ronny Bacon M.D., FACS 03/29/2022, 1:13 PM

## 2022-03-29 NOTE — Telephone Encounter (Signed)
Spoke with wife, [name], they have been advised of Pre-Admission date/time, and Surgery date at [facility].  Surgery Date: 04/02/22 Preadmission Testing Date: 03/30/22 (phone 8a-1p)  Patient has been made aware to call [phone], between 1-3:00pm the day before surgery, to find out what time to arrive for surgery.

## 2022-03-29 NOTE — Progress Notes (Signed)
Patient ID: [name]., male   DOB: January 26, 1961, 62 y.o.   MRN: [id number]  Chief Complaint: Right upper quadrant pain  History of Present Illness [name]. is a 62 y.o. male with history of gallbladder problems for years.  Seems to be exacerbated by eating spicy foods.  The pain does not radiate.  Denies nausea, reports loose stools.  Currently taking Plavix and aspirin.  Past Medical History Past Medical History:  Diagnosis Date   Arrhythmia    Asthma    Cyst of kidney, acquired    Diabetes mellitus without complication ([place]) 5621   type 2   Edentulous    Fatty liver    GERD (gastroesophageal reflux disease)    History of chicken pox    History of measles as a child    History of PKU    Hyperlipidemia    Hypertension    IBS (irritable bowel syndrome)    Irregular heart beat    Mentally challenged    Pancreatitis    Stroke ([place]) 03/02/2019   vision issues - right eye      Past Surgical History:  Procedure Laterality Date   Cardiac Catherization     [facility]   CARDIAC CATHETERIZATION     [facility]   CATARACT EXTRACTION W/PHACO Left 06/05/2021   Procedure: CATARACT EXTRACTION PHACO AND INTRAOCULAR LENS PLACEMENT ([place]) LEFT DIABETIC 3.75 00:28.0;  Surgeon: Eulogio Bear, MD;  Location: [place];  Service: Ophthalmology;  Laterality: Left;  Diabetic   CATARACT EXTRACTION W/PHACO Right 07/10/2021   Procedure: CATARACT EXTRACTION PHACO AND INTRAOCULAR LENS PLACEMENT ([facility]) RIGHT DIABETIC;  Surgeon: Eulogio Bear, MD;  Location: [place];  Service: Ophthalmology;  Laterality: Right;  Diabetic 2.66 00:24.4   COLONOSCOPY     COLONOSCOPY WITH PROPOFOL N/A 08/10/2020   Procedure: COLONOSCOPY WITH PROPOFOL;  Surgeon: Lin Landsman, MD;  Location: [facility] ENDOSCOPY;  Service: Gastroenterology;  Laterality: N/A;  Has ankle monitor; (Parole officer "Ms Andree Moro needs a time before Monday [phone]" - per patient's wife)    ESOPHAGOGASTRODUODENOSCOPY (EGD) WITH PROPOFOL N/A 12/27/2016   Procedure: ESOPHAGOGASTRODUODENOSCOPY (EGD) WITH PROPOFOL;  Surgeon: Lin Landsman, MD;  Location: [place];  Service: Gastroenterology;  Laterality: N/A;   HEMORRHOID SURGERY     Ligament Removal Left    of left thumb: dr. Cleda Mccreedy   ligament removal  of left thumb     Dr. Cleda Mccreedy   NM GATED MYOCARDIAL STUDY (ARMX HX)  06/23/2014   [place]. Normal    Allergies  Allergen Reactions   Bee Venom Hives and Swelling    All kinds of bees   Other     Certain powders   Sulfa Antibiotics     Current Outpatient Medications  Medication Sig Dispense Refill   aspirin 81 MG EC tablet Take 1 tablet (81 mg total) by mouth daily. 180 tablet 0   Blood Glucose Monitoring Suppl (ONE TOUCH ULTRA 2) w/Device KIT Use to check blood sugar 4 times a day 1 kit 0   BREO ELLIPTA 100-25 MCG/ACT AEPB INHALE 1 PUFF BY MOUTH ONCE DAILY 60 each 2   Cholecalciferol 1.25 MG (50000 UT) TABS Take 1 tablet by mouth once a week. 12 tablet 3   clopidogrel (PLAVIX) 75 MG tablet Take 1 tablet by mouth daily.     cyanocobalamin (VITAMIN B12) 1000 MCG tablet Take 1 tablet (1,000 mcg total) by mouth daily. 90 tablet 4   EPINEPHRINE 0.3 mg/0.3 mL IJ SOAJ  injection INJECT 1 SYRINGE INTO OUTER THIGH ONCE AS NEEDED FOR SEVERE ALLERGIC REACTION. 2 each 1   fenofibrate 54 MG tablet TAKE 1 TABLET BY MOUTH ONCE DAILY 90 tablet 3   gabapentin (NEURONTIN) 300 MG capsule TAKE 1 CAPSULE BY MOUTH TWICE DAILY 180 capsule 0   glucose blood (ONETOUCH ULTRA) test strip 1 each by Other route 5 (five) times daily. 300 each 3   icosapent Ethyl (VASCEPA) 1 g capsule Take 2 g by mouth 2 (two) times daily.     Insulin Disposable Pump (OMNIPOD DASH PDM, GEN 4,) KIT AS PRESCRIBED USE AS DIRECTED BASED ON TOTAL DAILY INSULIN AND POD CHANGE FREQUENCY     Insulin Pen Needle (PEN NEEDLES) 32G X 4 MM MISC 1 kit by Does not apply route daily. 100 each 4   JARDIANCE 25 MG TABS tablet  TAKE 1 TABLET BY MOUTH ONCE DAILY 90 tablet 1   Lancets (ONETOUCH DELICA PLUS HALPFX90W) MISC USE AS DIRECTED. 300 each 3   lisinopril (ZESTRIL) 10 MG tablet Take 1 tablet (10 mg total) by mouth daily. 90 tablet 4   lisinopril (ZESTRIL) 20 MG tablet TAKE 1 TABLET BY MOUTH ONCE DAILY 90 tablet 0   LYUMJEV KWIKPEN 100 UNIT/ML KwikPen Inject into the skin. Per endocrinology directions     Misc. Devices (PULSE OXIMETER FOR FINGER) MISC To check O2 saturations once daily with asthma and document + check if any shortness of breath 1 each 1   montelukast (SINGULAIR) 10 MG tablet TAKE 1 TABLET BY MOUTH AT BEDTIME 90 tablet 3   moxifloxacin (VIGAMOX) 0.5 % ophthalmic solution Apply to eye.     nitroGLYCERIN (NITROSTAT) 0.4 MG SL tablet Place 1 tablet (0.4 mg total) under the tongue every 5 (five) minutes as needed for chest pain. 50 tablet 3   omeprazole (PRILOSEC) 20 MG capsule TAKE 1 CAPSULE BY MOUTH ONCE DAILY 90 capsule 0   rosuvastatin (CRESTOR) 40 MG tablet Take 1 tablet (40 mg total) by mouth daily. 90 tablet 4   sitaGLIPtin (JANUVIA) 100 MG tablet Take 1 tablet (100 mg total) by mouth daily. 90 tablet 4   tiZANidine (ZANAFLEX) 2 MG tablet Take 1 tablet (2 mg total) by mouth every 6 (six) hours as needed for muscle spasms. 30 tablet 1   triamcinolone cream (KENALOG) 0.1 % Apply 1 Application topically 2 (two) times daily. 30 g 1   trimethoprim-polymyxin b (POLYTRIM) ophthalmic solution Place 1 drop into the right eye every 6 (six) hours.      ULTRACARE PEN NEEDLES 32G X 5 MM MISC      valACYclovir (VALTREX) 1000 MG tablet Take 1,000 mg by mouth 3 (three) times daily.     No current facility-administered medications for this visit.    Family History Family History  Problem Relation Age of Onset   Cancer Mother        throat   Diabetes Father    Heart disease Father    Cancer Maternal Grandmother    Cancer Maternal Grandfather        pancreatic   Cancer Paternal Grandfather        Social History Social History   Tobacco Use   Smoking status: Never    Passive exposure: Never   Smokeless tobacco: Never  Vaping Use   Vaping Use: Never used  Substance Use Topics   Alcohol use: No    Alcohol/week: 0.0 standard drinks of alcohol   Drug use: No  Review of Systems  Constitutional: Negative.   HENT: Negative.    Eyes:  Positive for double vision.  Respiratory: Negative.    Cardiovascular: Negative.   Gastrointestinal:  Positive for abdominal pain.  Genitourinary: Negative.   Skin: Negative.   Neurological:  Positive for tingling.  Psychiatric/Behavioral: Negative.       Physical Exam Blood pressure (!) 170/82, pulse 66, temperature 98.1 F (36.7 C), height 5' 4" (1.626 m), weight 175 lb (79.4 kg), SpO2 98 %. Last Weight  Most recent update: 03/29/2022  8:47 AM    Weight  79.4 kg (175 lb)             CONSTITUTIONAL: Well developed, and nourished, appropriately responsive and aware without distress.   EYES: Sclera non-icteric.   EARS, NOSE, MOUTH AND THROAT:  The oropharynx is clear. Oral mucosa is pink and moist.   Hearing is intact to voice.  NECK: Trachea is midline, and there is no jugular venous distension.  LYMPH NODES:  Lymph nodes in the neck are not appreciated. RESPIRATORY:  Lungs are clear, and breath sounds are equal bilaterally. Normal respiratory effort without pathologic use of accessory muscles. CARDIOVASCULAR: Heart is regular in rate and rhythm.  Well perfused.  GI: The abdomen is notable for minimal subcutaneous adipose, otherwise soft, nontender, and nondistended.  There is some right upper quadrant tenderness and it is difficult to distract him.  There were no palpable masses.  MUSCULOSKELETAL:  Symmetrical muscle tone appreciated in all four extremities.    SKIN: Skin turgor is normal. No pathologic skin lesions appreciated.  NEUROLOGIC:  Motor and sensation appear grossly normal.  Cranial nerves are grossly without  defect. PSYCH:  Alert and oriented to person, place and time. Affect is appropriate for situation.  Data Reviewed I have personally reviewed what is currently available of the patient's imaging, recent labs and medical records.   Labs:     Latest Ref Rng & Units 03/23/2022   10:10 AM 04/26/2021    2:25 PM 01/18/2021    2:31 PM  CBC  WBC 3.4 - 10.8 x10E3/uL 5.1  4.8  5.0   Hemoglobin 13.0 - 17.7 g/dL 15.9  14.6  13.4   Hematocrit 37.5 - 51.0 % 48.9  43.9  42.6   Platelets 150 - 450 x10E3/uL 198  174  176       Latest Ref Rng & Units 03/23/2022   10:10 AM 10/27/2021    4:06 PM 07/26/2021    3:22 PM  CMP  Glucose 70 - 99 mg/dL 307  295  322   BUN 6 - 24 mg/dL _0 Creatinine 0.76 - 1.27 mg/dL 0.92  1.27  1.02   Sodium 134 - 144 mmol/L 137  135  138   Potassium 3.5 - 5.2 mmol/L 4.5  4.1  4.2   Chloride 96 - 106 mmol/L 99  101  103   CO2 20 - 29 mmol/L _1 Calcium 8.7 - 10.2 mg/dL 9.4  9.4  9.0   Total Protein 6.0 - 8.5 g/dL 7.2  6.6    Total Bilirubin 0.0 - 1.2 mg/dL 0.3  0.3    Alkaline Phos 44 - 121 IU/L 186  152    AST 0 - 40 IU/L 32  24    ALT 0 - 44 IU/L 46  26       Imaging: Radiological images reviewed:  CLINICAL DATA:  Abdominal pain for  3 months   EXAM: ULTRASOUND ABDOMEN LIMITED RIGHT UPPER QUADRANT   COMPARISON:  CT abdomen pelvis 04/15/2021   FINDINGS: Gallbladder:   Small stone in the gallbladder neck measuring 4 mm. No gallbladder wall thickening or pericholecystic fluid. Negative sonographic Murphy's sign.   Common bile duct:   Diameter: 2 mm   Liver:   Increased echogenicity. No focal lesion. Portal vein is patent on color Doppler imaging with normal direction of blood flow towards the liver.   Other: None.   IMPRESSION: 1. Cholelithiasis without secondary signs of acute cholecystitis. 2. Increased hepatic parenchymal echogenicity suggestive of steatosis.     Electronically Signed   By: Lovey Newcomer M.D.   On: 01/12/2022  08:21 Within last 24 hrs: No results found.  Assessment     Patient Active Problem List   Diagnosis Date Noted   CCC (chronic calculous cholecystitis) 03/29/2022   Right hip pain 05/24/2021   B12 deficiency 04/26/2021   Heart murmur 09/21/2020   Type 2 diabetes mellitus with proteinuria ([place]) 03/25/2019   Atherosclerosis of both carotid arteries 03/22/2019   History of CVA (cerebrovascular accident) 03/06/2019   Uncontrolled type 2 diabetes mellitus with hyperglycemia ([place])    Obesity 09/03/2018   Frequent falls 05/15/2016   Vitamin D deficiency 08/22/2015   Carpal tunnel syndrome on left 05/12/2015   External hemorrhoids 12/20/2014   Allergic rhinitis 10/21/2014   Diverticulosis 10/21/2014   Hypertension associated with diabetes ([place]) 10/21/2014   Hyperlipidemia associated with type 2 diabetes mellitus ([place]) 10/21/2014   Asthma 10/21/2014   GERD (gastroesophageal reflux disease) 10/21/2014   NAFLD (nonalcoholic fatty liver disease) 10/21/2014   Arthritis 10/21/2014   History of pancreatitis 10/07/2014   Phenylketonuria (PKU) ([place]) 10/07/2014   Renal cyst, left 10/07/2014   Gall bladder polyp 10/01/2013    Plan    This was discussed thoroughly.  Optimal plan is for robotic cholecystectomy utilizing ICG imaging.  We will hold his Plavix, and schedule him for Monday.  Risks and benefits have been discussed with the patient which include but are not limited to anesthesia, bleeding, infection, biliary ductal injury, resulting in leak or stenosis, other associated unanticipated injuries affiliated with laparoscopic surgery.   Reviewed that removing the gallbladder will only address the symptoms related to the gallbladder itself.  I believe there is the desire to proceed, accepting the risks with understanding.  Questions elicited and answered to satisfaction.    No guarantees ever expressed or implied.   Face-to-face time spent with the patient and accompanying care  providers(if present) was 45 minutes, with more than 50% of the time spent counseling, educating, and coordinating care of the patient.    These notes generated with voice recognition software. I apologize for typographical errors.  Ronny Bacon M.D., FACS 03/29/2022, 1:13 PM

## 2022-03-30 ENCOUNTER — Encounter
Admission: RE | Admit: 2022-03-30 | Discharge: 2022-03-30 | Disposition: A | Discharge status: Home / Self Care | Payer: Medicare Other | Source: Ambulatory Visit | Attending: Surgery | Admitting: Surgery

## 2022-03-30 ENCOUNTER — Ambulatory Visit (INDEPENDENT_AMBULATORY_CARE_PROVIDER_SITE_OTHER): Payer: Medicare Other

## 2022-03-30 ENCOUNTER — Telehealth: Payer: Medicare Other

## 2022-03-30 DIAGNOSIS — E1129 Type 2 diabetes mellitus with other diabetic kidney complication: Secondary | ICD-10-CM

## 2022-03-30 DIAGNOSIS — Z794 Long term (current) use of insulin: Secondary | ICD-10-CM

## 2022-03-30 DIAGNOSIS — I152 Hypertension secondary to endocrine disorders: Secondary | ICD-10-CM

## 2022-03-30 DIAGNOSIS — E1165 Type 2 diabetes mellitus with hyperglycemia: Secondary | ICD-10-CM

## 2022-03-30 DIAGNOSIS — E1159 Type 2 diabetes mellitus with other circulatory complications: Secondary | ICD-10-CM

## 2022-03-30 DIAGNOSIS — J4540 Moderate persistent asthma, uncomplicated: Secondary | ICD-10-CM

## 2022-03-30 DIAGNOSIS — Z01818 Encounter for other preprocedural examination: Secondary | ICD-10-CM

## 2022-03-30 DIAGNOSIS — E119 Type 2 diabetes mellitus without complications: Secondary | ICD-10-CM

## 2022-03-30 DIAGNOSIS — Z8673 Personal history of transient ischemic attack (TIA), and cerebral infarction without residual deficits: Secondary | ICD-10-CM

## 2022-03-30 DIAGNOSIS — Z01810 Encounter for preprocedural cardiovascular examination: Secondary | ICD-10-CM

## 2022-03-30 HISTORY — DX: Other specified postprocedural states: Z98.890

## 2022-03-30 HISTORY — DX: Cholesterolosis of gallbladder: K82.4

## 2022-03-30 HISTORY — DX: Nausea with vomiting, unspecified: R11.2

## 2022-03-30 HISTORY — DX: Unspecified visual loss: H54.7

## 2022-03-30 HISTORY — DX: Cardiac murmur, unspecified: R01.1

## 2022-03-30 HISTORY — DX: Chronic obstructive pulmonary disease, unspecified: J44.9

## 2022-03-30 HISTORY — DX: Calculus of gallbladder with chronic cholecystitis without obstruction: K80.10

## 2022-03-30 HISTORY — DX: Diverticulosis of intestine, part unspecified, without perforation or abscess without bleeding: K57.90

## 2022-03-30 NOTE — Patient Instructions (Addendum)
Your procedure is scheduled on:04-02-22 Monday Report to the Registration Desk on the 1st floor of the [facility]. To find out your arrival time, please call [phone] between 1PM - 3PM on:03-30-22 Friday If your arrival time is 6:00 am, do not arrive prior to that time as the [facility] entrance doors do not open until 6:00 am.  REMEMBER: Instructions that are not followed completely may result in serious medical risk, up to and including death; or upon the discretion of your surgeon and anesthesiologist your surgery may need to be rescheduled.  Do not eat food OR drink any liquids after midnight the night before surgery.  No gum chewing, lozengers or hard candies.  TAKE THESE MEDICATIONS THE MORNING OF SURGERY WITH A SIP OF WATER: -fenofibrate  -gabapentin (NEURONTIN)  -omeprazole (PRILOSEC)  -tiZANidine (ZANAFLEX)  -traMADol (ULTRAM)  -valACYclovir (VALTREX)   Last dose of 81 mg Asa and Plavix was on 03-28-22 Wednesday as instructed by Dr Shawna Clamp office  Stop Jardiance NOW (03-30-22)-Last dose was on 03-29-22  One week prior to surgery: Stop Anti-inflammatories (NSAIDS) such as Advil, Aleve, Ibuprofen, Motrin, Naproxen, Naprosyn and Aspirin based products such as Excedrin, Goodys Powder, BC Powder. You may however, continue to take Tylenol/Tramadol if needed for pain up until the day of surgery.  No Alcohol for 24 hours before or after surgery.  No Smoking including e-cigarettes for 24 hours prior to surgery.  No chewable tobacco products for at least 6 hours prior to surgery.  No nicotine patches on the day of surgery.  Do not use any "recreational" drugs for at least a week prior to your surgery.  Please be advised that the combination of cocaine and anesthesia may have negative outcomes, up to and including death. If you test positive for cocaine, your surgery will be cancelled.  On the morning of surgery brush your teeth with toothpaste and water, you may rinse your  mouth with mouthwash if you wish. Do not swallow any toothpaste or mouthwash.  Do not wear jewelry, make-up, hairpins, clips or nail polish.  Do not wear lotions, powders, or perfumes.   Do not shave body from the neck down 48 hours prior to surgery just in case you cut yourself which could leave a site for infection.  Also, freshly shaved skin may become irritated if using the CHG soap.  Contact lenses, hearing aids and dentures may not be worn into surgery.  Do not bring valuables to the hospital. [facility] is not responsible for any missing/lost belongings or valuables.   Notify your doctor if there is any change in your medical condition (cold, fever, infection).  Wear comfortable clothing (specific to your surgery type) to the hospital.  After surgery, you can help prevent lung complications by doing breathing exercises.  Take deep breaths and cough every 1-2 hours. Your doctor may order a device called an Incentive Spirometer to help you take deep breaths. When coughing or sneezing, hold a pillow firmly against your incision with both hands. This is called "splinting." Doing this helps protect your incision. It also decreases belly discomfort.  If you are being admitted to the hospital overnight, leave your suitcase in the car. After surgery it may be brought to your room.  If you are being discharged the day of surgery, you will not be allowed to drive home. You will need a responsible adult (18 years or older) to drive you home and stay with you that night.   If you are taking  public transportation, you will need to have a responsible adult (18 years or older) with you. Please confirm with your physician that it is acceptable to use public transportation.   Please call the [facility] Dept. at [phone] if you have any questions about these instructions.  Surgery Visitation Policy:  Patients undergoing a surgery or procedure may have two family members or  support persons with them as long as the person is not COVID-19 positive or experiencing its symptoms.   Due to an increase in RSV and influenza rates and associated hospitalizations, children ages 65 and under will not be able to visit patients in [facility]. Masks continue to be strongly recommended.

## 2022-03-30 NOTE — Patient Instructions (Signed)
Please call the care guide team at [phone] if you need to cancel or reschedule your appointment.   If you are experiencing a Mental Health or [place] or need someone to talk to, please call the Suicide and Crisis Lifeline: 988 call the [place] National Suicide Prevention Lifeline: 564-721-2880 or TTY: (979)629-5510 TTY [phone]) to talk to a trained counselor call 1-800-273-TALK (toll free, 24 hour hotline)   Following is a copy of the CCM Program Consent:  CCM service includes personalized support from designated clinical staff supervised by the physician, including individualized plan of care and coordination with other care providers 24/7 contact phone numbers for assistance for urgent and routine care needs. Service will only be billed when office clinical staff spend 20 minutes or more in a month to coordinate care. Only one practitioner may furnish and bill the service in a calendar month. The patient may stop CCM services at amy time (effective at the end of the month) by phone call to the office staff. The patient will be responsible for cost sharing (co-pay) or up to 20% of the service fee (after annual deductible is met)  Following is a copy of your full provider care plan:   Goals Addressed             This Visit's Progress    CCM Expected Outcome:  Monitor, Self-Manage and Reduce Symptoms of Diabetes       Current Barriers:  Knowledge Deficits related to the need to check blood sugars and keep blood sugars in normal range Care Coordination needs related to the importance of monitoring blood sugars, eating healthy, maintaining good control of DM in a patient with DM Chronic Disease Management support and education needs related to effective management of DM Literacy barriers  Lab Results  Component Value Date   HGBA1C 9.9 (H) 03/23/2022     Planned Interventions: Provided education to patient about basic DM disease process. The patient needs  ongoing support and education with effective management of DM. The patients wife states currently he is eating better and checking his blood sugars and writing them down; Reviewed medications with patient and discussed importance of medication adherence. The patient is compliant with medications. The endocrinologist wants him to wear an insulin pump. The patient and his wife are concerned that if the wife has to go in hospital that the patient would not know how to operate it. They feel he just would not do well with it. Education on discussing his concerns with the pcp and endocrinologist;        Reviewed prescribed diet with patient heart healthy/ADA. The patient is doing better with ADA and options that will help with maintaining healthy blood sugars but having a hard time refraining from salt ; Counseled on importance of regular laboratory monitoring as prescribed;        Discussed plans with patient for ongoing care management follow up and provided patient with direct contact information for care management team;      Provided patient with written educational materials related to hypo and hyperglycemia and importance of correct treatment. The patient has had a couple falls due to his blood sugars getting down to 40's. The patients wife states he was concerned about his blood sugars being too high so he has not been eating like he was supposed to. Education on safety and monitoring for changes in the way he feels;       Reviewed scheduled/upcoming provider appointments including: 04-20-2022 at 1145 am;  Advised patient, providing education and rationale, to check cbg before meals and at bedtime and when you have symptoms of low or high blood sugar and record. The endocrinologist had given the patient a continuous glucose reader but he kept knocking it off of his arm. He does not wish to keep wearing this. Education on monitoring for changes and letting the provider know for acute changes.   The  patient gave several readings today. The last several days his blood sugars have been 125,155, 142, and 186. His wife is encouraging him to  eat better and they have others bringing in other healthy food options.       call provider for findings outside established parameters;       Review of patient status, including review of consultants reports, relevant laboratory and other test results, and medications completed;       Advised patient to discuss changes in DM, questions, or concerns with provider;     Sees eye specialist in January. Is possibly going to have new surgery to help vision  Screening for signs and symptoms of depression related to chronic disease state;        Assessed social determinant of health barriers;         Symptom Management: Take medications as prescribed   Attend all scheduled provider appointments Call provider office for new concerns or questions  call the Suicide and Crisis Lifeline: 988 call the [place] National Suicide Prevention Lifeline: 762-119-0329 or TTY: 650 548 1973 TTY [phone]) to talk to a trained counselor call 1-800-273-TALK (toll free, 24 hour hotline) if experiencing a Mental Health or [place]  keep appointment with eye doctor check feet daily for cuts, sores or redness trim toenails straight across manage portion size wash and dry feet carefully every day wear comfortable, cotton socks wear comfortable, well-fitting shoes  Follow Up Plan: Telephone follow up appointment with care management team member scheduled for: 04-20-2022 at 1145 am       CCM Expected Outcome:  Monitor, Self-Manage and Reduce Symptoms of: Asthma       Current Barriers:  Knowledge Deficits related to triggers that can cause exacerbation of his asthma Chronic Disease Management support and education needs related to effective management of Asthma  Planned Interventions: Provided patient with basic written and verbal Asthma education on self  care/management/and exacerbation prevention. Denies any issues with his breathing. The patient states his breathing is stable. Denies any new concerns at this time; Advised patient to track and manage Asthma triggers;  Provided written and verbal instructions on pursed lip breathing and utilized returned demonstration as teach back; Provided instruction about proper use of medications used for management of Asthma including inhalers; Advised patient to self assesses Asthma action plan zone and make appointment with provider if in the yellow zone for 48 hours without improvement; Advised patient to engage in light exercise as tolerated 3-5 days a week to aid in the the management of Asthma; Provided education about and advised patient to utilize infection prevention strategies to reduce risk of respiratory infection; Discussed the importance of adequate rest and management of fatigue with Asthma; Screening for signs and symptoms of depression related to chronic disease state;  Assessed social determinant of health barriers;   Symptom Management: Take medications as prescribed   Attend all scheduled provider appointments Call provider office for new concerns or questions  call the Suicide and Crisis Lifeline: 988 call the [place] National Suicide Prevention Lifeline: 270-793-9003 or TTY: 405-147-6354 TTY [phone])  to talk to a trained counselor call 1-800-273-TALK (toll free, 24 hour hotline) if experiencing a Mental Health or [place]   Follow Up Plan: Telephone follow up appointment with care management team member scheduled for: 04-20-2022 at 1145 am        CCM Expected Outcome:  Monitor, Self-Manage, and Reduce Symptoms of Hypertension       Current Barriers:  Knowledge Deficits related to the importance for following a heart healthy/ADA diet and the need to restrict sodium in dietary habits  Chronic Disease Management support and education needs related to effective  management of HTN  BP Readings from Last 3 Encounters:  03/29/22 (!) 170/82  03/23/22 128/80  01/30/22 128/82     Planned Interventions: Evaluation of current treatment plan related to hypertension self management and patient's adherence to plan as established by provider. The patient with elevated blood pressure at the last office visit.  The patient has been having a lot of pain due to his gallbladder. He will have surgery on 04-02-2022 for gallbladder removal. The patient should be able to come home after his surgery unless there are complications. Education and support given;   Provided education to patient re: stroke prevention, s/s of heart attack and stroke; Reviewed prescribed diet heart healthy/ADA diet. The patient is more mindful of what he is eating and watching what he eats. He does like salt and sometimes uses too much salt. Education and support given.  Reviewed medications with patient and discussed importance of compliance;  Discussed plans with patient for ongoing care management follow up and provided patient with direct contact information for care management team; Advised patient, providing education and rationale, to monitor blood pressure daily and record, calling PCP for findings outside established parameters;  Reviewed scheduled/upcoming provider appointments including: Saw the pcp on 03-15-2022, next appointment on 05-02-2022 at 28 am Advised patient to discuss changes in blood pressures or heart health with provider; Provided education on prescribed diet heart healthy/ADA diet. Education and support given. Review of heart healthy/ADA choices ;  Discussed complications of poorly controlled blood pressure such as heart disease, stroke, circulatory complications, vision complications, kidney impairment, sexual dysfunction;  Screening for signs and symptoms of depression related to chronic disease state;  Assessed social determinant of health barriers;   Symptom  Management: Take medications as prescribed   Attend all scheduled provider appointments Call provider office for new concerns or questions  call the Suicide and Crisis Lifeline: 988 call the [place] National Suicide Prevention Lifeline: 570-162-5357 or TTY: 478-681-3235 TTY [phone]) to talk to a trained counselor call 1-800-273-TALK (toll free, 24 hour hotline) if experiencing a Mental Health or [place]  check blood pressure 3 times per week learn about high blood pressure keep a blood pressure log take blood pressure log to all doctor appointments call doctor for signs and symptoms of high blood pressure develop an action plan for high blood pressure keep all doctor appointments take medications for blood pressure exactly as prescribed report new symptoms to your doctor  Follow Up Plan: Telephone follow up appointment with care management team member scheduled for: 04-20-2022 at 1145 am          The patient verbalized understanding of instructions, educational materials, and care plan provided today and DECLINED offer to receive copy of patient instructions, educational materials, and care plan.   Telephone follow up appointment with care management team member scheduled for: 04-20-2022 at 1145 am

## 2022-03-30 NOTE — Chronic Care Management (AMB) (Signed)
Chronic Care Management   CCM RN Visit Note  03/30/2022 Name: [name]. MRN: [id number] DOB: February 23, 1961  Subjective: [name]. is a 62 y.o. year old male who is a primary care patient of Cannady, Barbaraann Faster, NP. The patient was referred to the Chronic Care Management team for assistance with care management needs subsequent to provider initiation of CCM services and plan of care.    Today's Visit:  Engaged with patient by telephone for follow up visit.        Goals Addressed             This Visit's Progress    CCM Expected Outcome:  Monitor, Self-Manage and Reduce Symptoms of Diabetes       Current Barriers:  Knowledge Deficits related to the need to check blood sugars and keep blood sugars in normal range Care Coordination needs related to the importance of monitoring blood sugars, eating healthy, maintaining good control of DM in a patient with DM Chronic Disease Management support and education needs related to effective management of DM Literacy barriers  Lab Results  Component Value Date   HGBA1C 9.9 (H) 03/23/2022     Planned Interventions: Provided education to patient about basic DM disease process. The patient needs ongoing support and education with effective management of DM. The patients wife states currently he is eating better and checking his blood sugars and writing them down; Reviewed medications with patient and discussed importance of medication adherence. The patient is compliant with medications. The endocrinologist wants him to wear an insulin pump. The patient and his wife are concerned that if the wife has to go in hospital that the patient would not know how to operate it. They feel he just would not do well with it. Education on discussing his concerns with the pcp and endocrinologist;        Reviewed prescribed diet with patient heart healthy/ADA. The patient is doing better with ADA and options that will help with maintaining  healthy blood sugars but having a hard time refraining from salt ; Counseled on importance of regular laboratory monitoring as prescribed;        Discussed plans with patient for ongoing care management follow up and provided patient with direct contact information for care management team;      Provided patient with written educational materials related to hypo and hyperglycemia and importance of correct treatment. The patient has had a couple falls due to his blood sugars getting down to 40's. The patients wife states he was concerned about his blood sugars being too high so he has not been eating like he was supposed to. Education on safety and monitoring for changes in the way he feels;       Reviewed scheduled/upcoming provider appointments including: 04-20-2022 at 1145 am;         Advised patient, providing education and rationale, to check cbg before meals and at bedtime and when you have symptoms of low or high blood sugar and record. The endocrinologist had given the patient a continuous glucose reader but he kept knocking it off of his arm. He does not wish to keep wearing this. Education on monitoring for changes and letting the provider know for acute changes.   The patient gave several readings today. The last several days his blood sugars have been 125,155, 142, and 186. His wife is encouraging him to  eat better and they have others bringing in other healthy food options.  call provider for findings outside established parameters;       Review of patient status, including review of consultants reports, relevant laboratory and other test results, and medications completed;       Advised patient to discuss changes in DM, questions, or concerns with provider;     Sees eye specialist in January. Is possibly going to have new surgery to help vision  Screening for signs and symptoms of depression related to chronic disease state;        Assessed social determinant of health barriers;          Symptom Management: Take medications as prescribed   Attend all scheduled provider appointments Call provider office for new concerns or questions  call the Suicide and Crisis Lifeline: 988 call the [place] National Suicide Prevention Lifeline: 925-139-2891 or TTY: 818-446-9816 TTY [phone]) to talk to a trained counselor call 1-800-273-TALK (toll free, 24 hour hotline) if experiencing a Mental Health or [place]  keep appointment with eye doctor check feet daily for cuts, sores or redness trim toenails straight across manage portion size wash and dry feet carefully every day wear comfortable, cotton socks wear comfortable, well-fitting shoes  Follow Up Plan: Telephone follow up appointment with care management team member scheduled for: 04-20-2022 at 1145 am       CCM Expected Outcome:  Monitor, Self-Manage and Reduce Symptoms of: Asthma       Current Barriers:  Knowledge Deficits related to triggers that can cause exacerbation of his asthma Chronic Disease Management support and education needs related to effective management of Asthma  Planned Interventions: Provided patient with basic written and verbal Asthma education on self care/management/and exacerbation prevention. Denies any issues with his breathing. The patient states his breathing is stable. Denies any new concerns at this time; Advised patient to track and manage Asthma triggers;  Provided written and verbal instructions on pursed lip breathing and utilized returned demonstration as teach back; Provided instruction about proper use of medications used for management of Asthma including inhalers; Advised patient to self assesses Asthma action plan zone and make appointment with provider if in the yellow zone for 48 hours without improvement; Advised patient to engage in light exercise as tolerated 3-5 days a week to aid in the the management of Asthma; Provided education about and advised patient to  utilize infection prevention strategies to reduce risk of respiratory infection; Discussed the importance of adequate rest and management of fatigue with Asthma; Screening for signs and symptoms of depression related to chronic disease state;  Assessed social determinant of health barriers;   Symptom Management: Take medications as prescribed   Attend all scheduled provider appointments Call provider office for new concerns or questions  call the Suicide and Crisis Lifeline: 988 call the [place] National Suicide Prevention Lifeline: 848-221-2101 or TTY: 8575169797 TTY [phone]) to talk to a trained counselor call 1-800-273-TALK (toll free, 24 hour hotline) if experiencing a Mental Health or [place]   Follow Up Plan: Telephone follow up appointment with care management team member scheduled for: 04-20-2022 at 1145 am        CCM Expected Outcome:  Monitor, Self-Manage, and Reduce Symptoms of Hypertension       Current Barriers:  Knowledge Deficits related to the importance for following a heart healthy/ADA diet and the need to restrict sodium in dietary habits  Chronic Disease Management support and education needs related to effective management of HTN  BP Readings from Last 3 Encounters:  03/29/22 [name])  170/82  03/23/22 128/80  01/30/22 128/82     Planned Interventions: Evaluation of current treatment plan related to hypertension self management and patient's adherence to plan as established by provider. The patient with elevated blood pressure at the last office visit.  The patient has been having a lot of pain due to his gallbladder. He will have surgery on 04-02-2022 for gallbladder removal. The patient should be able to come home after his surgery unless there are complications. Education and support given;   Provided education to patient re: stroke prevention, s/s of heart attack and stroke; Reviewed prescribed diet heart healthy/ADA diet. The patient is more  mindful of what he is eating and watching what he eats. He does like salt and sometimes uses too much salt. Education and support given.  Reviewed medications with patient and discussed importance of compliance;  Discussed plans with patient for ongoing care management follow up and provided patient with direct contact information for care management team; Advised patient, providing education and rationale, to monitor blood pressure daily and record, calling PCP for findings outside established parameters;  Reviewed scheduled/upcoming provider appointments including: Saw the pcp on 03-15-2022, next appointment on 05-02-2022 at 58 am Advised patient to discuss changes in blood pressures or heart health with provider; Provided education on prescribed diet heart healthy/ADA diet. Education and support given. Review of heart healthy/ADA choices ;  Discussed complications of poorly controlled blood pressure such as heart disease, stroke, circulatory complications, vision complications, kidney impairment, sexual dysfunction;  Screening for signs and symptoms of depression related to chronic disease state;  Assessed social determinant of health barriers;   Symptom Management: Take medications as prescribed   Attend all scheduled provider appointments Call provider office for new concerns or questions  call the Suicide and Crisis Lifeline: 988 call the [place] National Suicide Prevention Lifeline: 414-226-5000 or TTY: 4053472832 TTY 757-053-1051) to talk to a trained counselor call 1-800-273-TALK (toll free, 24 hour hotline) if experiencing a Mental Health or [place]  check blood pressure 3 times per week learn about high blood pressure keep a blood pressure log take blood pressure log to all doctor appointments call doctor for signs and symptoms of high blood pressure develop an action plan for high blood pressure keep all doctor appointments take medications for blood pressure  exactly as prescribed report new symptoms to your doctor  Follow Up Plan: Telephone follow up appointment with care management team member scheduled for: 04-20-2022 at 1145 am          Plan:Telephone follow up appointment with care management team member scheduled for:  04-20-2022 at 1145 am  Noreene Larsson RN, MSN, CCM RN Care Manager  Chronic Care Management Direct Number: [phone]

## 2022-04-01 MED ORDER — CEFAZOLIN SODIUM-DEXTROSE 2-4 GM/100ML-% IV SOLN
2.0000 g | INTRAVENOUS | Status: AC
  Administered 2022-04-02: 2 g via INTRAVENOUS

## 2022-04-01 MED ORDER — ACETAMINOPHEN 500 MG PO TABS: 1000.0000 mg | ORAL_TABLET | ORAL | Status: AC

## 2022-04-01 MED ORDER — BUPIVACAINE LIPOSOME 1.3 % IJ SUSP: 20.0000 mL | Freq: Once | INTRAMUSCULAR | Status: DC

## 2022-04-01 MED ORDER — GABAPENTIN 300 MG PO CAPS: 300.0000 mg | ORAL_CAPSULE | ORAL | Status: AC

## 2022-04-01 MED ORDER — INDOCYANINE GREEN 25 MG IV SOLR
1.2500 mg | Freq: Once | INTRAVENOUS | Status: AC
  Administered 2022-04-02: 1.25 mg via INTRAVENOUS
  Filled 2022-04-01: qty 0.5

## 2022-04-02 ENCOUNTER — Other Ambulatory Visit: Payer: Self-pay

## 2022-04-02 ENCOUNTER — Encounter
Admission: RE | Disposition: A | Discharge status: Home / Self Care | Payer: Self-pay | Source: Home / Self Care | Attending: Surgery

## 2022-04-02 ENCOUNTER — Ambulatory Visit: Payer: Medicare Other | Admitting: Urgent Care

## 2022-04-02 ENCOUNTER — Ambulatory Visit
Admission: RE | Admit: 2022-04-02 | Discharge: 2022-04-02 | Disposition: A | Discharge status: Home / Self Care | Payer: Medicare Other | Attending: Surgery | Admitting: Surgery

## 2022-04-02 DIAGNOSIS — Z136 Encounter for screening for cardiovascular disorders: Secondary | ICD-10-CM | POA: Diagnosis not present

## 2022-04-02 DIAGNOSIS — H5461 Unqualified visual loss, right eye, normal vision left eye: Secondary | ICD-10-CM | POA: Diagnosis not present

## 2022-04-02 DIAGNOSIS — Z7984 Long term (current) use of oral hypoglycemic drugs: Secondary | ICD-10-CM | POA: Diagnosis not present

## 2022-04-02 DIAGNOSIS — E785 Hyperlipidemia, unspecified: Secondary | ICD-10-CM | POA: Insufficient documentation

## 2022-04-02 DIAGNOSIS — I69398 Other sequelae of cerebral infarction: Secondary | ICD-10-CM | POA: Insufficient documentation

## 2022-04-02 DIAGNOSIS — I10 Essential (primary) hypertension: Secondary | ICD-10-CM | POA: Diagnosis not present

## 2022-04-02 DIAGNOSIS — K219 Gastro-esophageal reflux disease without esophagitis: Secondary | ICD-10-CM | POA: Diagnosis not present

## 2022-04-02 DIAGNOSIS — I69351 Hemiplegia and hemiparesis following cerebral infarction affecting right dominant side: Secondary | ICD-10-CM | POA: Diagnosis not present

## 2022-04-02 DIAGNOSIS — Z794 Long term (current) use of insulin: Secondary | ICD-10-CM | POA: Insufficient documentation

## 2022-04-02 DIAGNOSIS — Z7902 Long term (current) use of antithrombotics/antiplatelets: Secondary | ICD-10-CM | POA: Diagnosis not present

## 2022-04-02 DIAGNOSIS — Z8673 Personal history of transient ischemic attack (TIA), and cerebral infarction without residual deficits: Secondary | ICD-10-CM

## 2022-04-02 DIAGNOSIS — Z7982 Long term (current) use of aspirin: Secondary | ICD-10-CM | POA: Insufficient documentation

## 2022-04-02 DIAGNOSIS — I152 Hypertension secondary to endocrine disorders: Secondary | ICD-10-CM

## 2022-04-02 DIAGNOSIS — K8010 Calculus of gallbladder with chronic cholecystitis without obstruction: Secondary | ICD-10-CM | POA: Diagnosis not present

## 2022-04-02 DIAGNOSIS — E1165 Type 2 diabetes mellitus with hyperglycemia: Secondary | ICD-10-CM | POA: Diagnosis not present

## 2022-04-02 DIAGNOSIS — J449 Chronic obstructive pulmonary disease, unspecified: Secondary | ICD-10-CM | POA: Insufficient documentation

## 2022-04-02 DIAGNOSIS — Z01818 Encounter for other preprocedural examination: Secondary | ICD-10-CM

## 2022-04-02 DIAGNOSIS — E119 Type 2 diabetes mellitus without complications: Secondary | ICD-10-CM

## 2022-04-02 DIAGNOSIS — T7840XA Allergy, unspecified, initial encounter: Secondary | ICD-10-CM | POA: Diagnosis not present

## 2022-04-02 DIAGNOSIS — Z01810 Encounter for preprocedural cardiovascular examination: Secondary | ICD-10-CM

## 2022-04-02 LAB — GLUCOSE, CAPILLARY
Glucose-Capillary: 163 mg/dL — ABNORMAL HIGH (ref 70–99)
Glucose-Capillary: 228 mg/dL — ABNORMAL HIGH (ref 70–99)

## 2022-04-02 SURGERY — CHOLECYSTECTOMY, ROBOT-ASSISTED, LAPAROSCOPIC
Anesthesia: General | Site: Abdomen

## 2022-04-02 MED ORDER — BUPIVACAINE-EPINEPHRINE (PF) 0.5% -1:200000 IJ SOLN
INTRAMUSCULAR | Status: DC | PRN
  Administered 2022-04-02: 30 mL

## 2022-04-02 MED ORDER — LIDOCAINE HCL (CARDIAC) PF 100 MG/5ML IV SOSY
PREFILLED_SYRINGE | INTRAVENOUS | Status: DC | PRN
  Administered 2022-04-02: 80 mg via INTRAVENOUS

## 2022-04-02 MED ORDER — ORAL CARE MOUTH RINSE: 15.0000 mL | Freq: Once | OROMUCOSAL | Status: AC

## 2022-04-02 MED ORDER — FENTANYL CITRATE (PF) 100 MCG/2ML IJ SOLN
INTRAMUSCULAR | Status: AC
  Administered 2022-04-02: 25 ug via INTRAVENOUS
  Filled 2022-04-02: qty 2

## 2022-04-02 MED ORDER — OXYCODONE HCL 5 MG PO TABS: 5.0000 mg | ORAL_TABLET | Freq: Once | ORAL | Status: AC | PRN

## 2022-04-02 MED ORDER — MIDAZOLAM HCL 2 MG/2ML IJ SOLN
INTRAMUSCULAR | Status: DC | PRN
  Administered 2022-04-02: 2 mg via INTRAVENOUS

## 2022-04-02 MED ORDER — GLYCOPYRROLATE 0.2 MG/ML IJ SOLN
INTRAMUSCULAR | Status: AC
  Filled 2022-04-02: qty 1

## 2022-04-02 MED ORDER — OXYCODONE HCL 5 MG/5ML PO SOLN: 5.0000 mg | Freq: Once | ORAL | Status: AC | PRN

## 2022-04-02 MED ORDER — OXYCODONE HCL 5 MG PO TABS
ORAL_TABLET | ORAL | Status: AC
  Administered 2022-04-02: 5 mg via ORAL
  Filled 2022-04-02: qty 1

## 2022-04-02 MED ORDER — ONDANSETRON HCL 4 MG/2ML IJ SOLN: 4.0000 mg | Freq: Once | INTRAMUSCULAR | Status: DC | PRN

## 2022-04-02 MED ORDER — LIDOCAINE HCL (PF) 2 % IJ SOLN
INTRAMUSCULAR | Status: AC
  Filled 2022-04-02: qty 5

## 2022-04-02 MED ORDER — SODIUM CHLORIDE 0.9 % IR SOLN
Status: DC | PRN
  Administered 2022-04-02: 3000 mL

## 2022-04-02 MED ORDER — ACETAMINOPHEN 500 MG PO TABS
ORAL_TABLET | ORAL | Status: AC
  Administered 2022-04-02: 1000 mg via ORAL
  Filled 2022-04-02: qty 2

## 2022-04-02 MED ORDER — DEXAMETHASONE SODIUM PHOSPHATE 10 MG/ML IJ SOLN
INTRAMUSCULAR | Status: AC
  Filled 2022-04-02: qty 1

## 2022-04-02 MED ORDER — SODIUM CHLORIDE 0.9 % IV SOLN: INTRAVENOUS | Status: DC

## 2022-04-02 MED ORDER — EPHEDRINE SULFATE (PRESSORS) 50 MG/ML IJ SOLN
INTRAMUSCULAR | Status: DC | PRN
  Administered 2022-04-02: 10 mg via INTRAVENOUS

## 2022-04-02 MED ORDER — CHLORHEXIDINE GLUCONATE 0.12 % MT SOLN
OROMUCOSAL | Status: AC
  Administered 2022-04-02: 15 mL via OROMUCOSAL
  Filled 2022-04-02: qty 15

## 2022-04-02 MED ORDER — ROCURONIUM BROMIDE 10 MG/ML (PF) SYRINGE
PREFILLED_SYRINGE | INTRAVENOUS | Status: AC
  Filled 2022-04-02: qty 10

## 2022-04-02 MED ORDER — LACTATED RINGERS IV SOLN: INTRAVENOUS | Status: DC | PRN

## 2022-04-02 MED ORDER — CHLORHEXIDINE GLUCONATE 0.12 % MT SOLN: 15.0000 mL | Freq: Once | OROMUCOSAL | Status: AC

## 2022-04-02 MED ORDER — CHLORHEXIDINE GLUCONATE CLOTH 2 % EX PADS: 6.0000 | MEDICATED_PAD | Freq: Once | CUTANEOUS | Status: DC

## 2022-04-02 MED ORDER — GABAPENTIN 300 MG PO CAPS
ORAL_CAPSULE | ORAL | Status: AC
  Administered 2022-04-02: 300 mg via ORAL
  Filled 2022-04-02: qty 1

## 2022-04-02 MED ORDER — HYDROCODONE-ACETAMINOPHEN 5-325 MG PO TABS: 1.0000 | ORAL_TABLET | Freq: Four times a day (QID) | ORAL | 0 refills | Status: DC | PRN

## 2022-04-02 MED ORDER — KETAMINE HCL 10 MG/ML IJ SOLN
INTRAMUSCULAR | Status: DC | PRN
  Administered 2022-04-02: 20 mg via INTRAVENOUS

## 2022-04-02 MED ORDER — FENTANYL CITRATE (PF) 100 MCG/2ML IJ SOLN
25.0000 ug | INTRAMUSCULAR | Status: AC | PRN
  Administered 2022-04-02 (×4): 25 ug via INTRAVENOUS

## 2022-04-02 MED ORDER — ONDANSETRON HCL 4 MG/2ML IJ SOLN
INTRAMUSCULAR | Status: AC
  Filled 2022-04-02: qty 2

## 2022-04-02 MED ORDER — DEXAMETHASONE SODIUM PHOSPHATE 10 MG/ML IJ SOLN
INTRAMUSCULAR | Status: DC | PRN
  Administered 2022-04-02: 10 mg via INTRAVENOUS

## 2022-04-02 MED ORDER — KETAMINE HCL 50 MG/5ML IJ SOSY
PREFILLED_SYRINGE | INTRAMUSCULAR | Status: AC
  Filled 2022-04-02: qty 5

## 2022-04-02 MED ORDER — ROCURONIUM BROMIDE 100 MG/10ML IV SOLN
INTRAVENOUS | Status: DC | PRN
  Administered 2022-04-02: 10 mg via INTRAVENOUS
  Administered 2022-04-02: 40 mg via INTRAVENOUS

## 2022-04-02 MED ORDER — FENTANYL CITRATE (PF) 100 MCG/2ML IJ SOLN
INTRAMUSCULAR | Status: DC | PRN
  Administered 2022-04-02 (×2): 50 ug via INTRAVENOUS

## 2022-04-02 MED ORDER — LABETALOL HCL 5 MG/ML IV SOLN
INTRAVENOUS | Status: AC
  Filled 2022-04-02: qty 4

## 2022-04-02 MED ORDER — 0.9 % SODIUM CHLORIDE (POUR BTL) OPTIME
TOPICAL | Status: DC | PRN
  Administered 2022-04-02: 500 mL

## 2022-04-02 MED ORDER — PROPOFOL 10 MG/ML IV BOLUS
INTRAVENOUS | Status: DC | PRN
  Administered 2022-04-02: 120 mg via INTRAVENOUS
  Administered 2022-04-02 (×2): 40 mg via INTRAVENOUS

## 2022-04-02 MED ORDER — LABETALOL HCL 5 MG/ML IV SOLN
INTRAVENOUS | Status: DC | PRN
  Administered 2022-04-02 (×3): 5 mg via INTRAVENOUS

## 2022-04-02 MED ORDER — BUPIVACAINE LIPOSOME 1.3 % IJ SUSP
INTRAMUSCULAR | Status: AC
  Filled 2022-04-02: qty 20

## 2022-04-02 MED ORDER — IBUPROFEN 800 MG PO TABS: 800.0000 mg | ORAL_TABLET | Freq: Three times a day (TID) | ORAL | 0 refills | Status: DC | PRN

## 2022-04-02 MED ORDER — FENTANYL CITRATE (PF) 100 MCG/2ML IJ SOLN
INTRAMUSCULAR | Status: AC
  Filled 2022-04-02: qty 2

## 2022-04-02 MED ORDER — BUPIVACAINE-EPINEPHRINE (PF) 0.5% -1:200000 IJ SOLN
INTRAMUSCULAR | Status: AC
  Filled 2022-04-02: qty 30

## 2022-04-02 MED ORDER — CEFAZOLIN SODIUM-DEXTROSE 2-4 GM/100ML-% IV SOLN
INTRAVENOUS | Status: AC
  Filled 2022-04-02: qty 100

## 2022-04-02 MED ORDER — MIDAZOLAM HCL 2 MG/2ML IJ SOLN
INTRAMUSCULAR | Status: AC
  Filled 2022-04-02: qty 2

## 2022-04-02 MED ORDER — PROPOFOL 10 MG/ML IV BOLUS
INTRAVENOUS | Status: AC
  Filled 2022-04-02: qty 40

## 2022-04-02 MED ORDER — ONDANSETRON HCL 4 MG/2ML IJ SOLN
INTRAMUSCULAR | Status: DC | PRN
  Administered 2022-04-02: 4 mg via INTRAVENOUS

## 2022-04-02 MED ORDER — SUGAMMADEX SODIUM 200 MG/2ML IV SOLN
INTRAVENOUS | Status: DC | PRN
  Administered 2022-04-02: 200 mg via INTRAVENOUS

## 2022-04-02 MED ORDER — SEVOFLURANE IN SOLN
RESPIRATORY_TRACT | Status: AC
  Filled 2022-04-02: qty 250

## 2022-04-02 SURGICAL SUPPLY — 46 items
ADH SKN CLS APL DERMABOND .7 (GAUZE/BANDAGES/DRESSINGS) ×2
BAG PRESSURE INF REUSE 3000 (BAG) IMPLANT
CLIP LIGATING HEM O LOK PURPLE (MISCELLANEOUS) ×2 IMPLANT
COVER TIP SHEARS 8 DVNC (MISCELLANEOUS) ×2 IMPLANT
COVER TIP SHEARS 8MM DA VINCI (MISCELLANEOUS) ×2
DERMABOND ADVANCED .7 DNX12 (GAUZE/BANDAGES/DRESSINGS) ×2 IMPLANT
DRAPE ARM DVNC X/XI (DISPOSABLE) ×8 IMPLANT
DRAPE COLUMN DVNC XI (DISPOSABLE) ×2 IMPLANT
DRAPE DA VINCI XI ARM (DISPOSABLE) ×8
DRAPE DA VINCI XI COLUMN (DISPOSABLE) ×2
ELECT CAUTERY BLADE 6.4 (BLADE) ×2 IMPLANT
GLOVE ORTHO TXT STRL SZ7.5 (GLOVE) ×4 IMPLANT
GOWN STRL REUS W/ TWL LRG LVL3 (GOWN DISPOSABLE) ×4 IMPLANT
GOWN STRL REUS W/ TWL XL LVL3 (GOWN DISPOSABLE) ×4 IMPLANT
GOWN STRL REUS W/TWL LRG LVL3 (GOWN DISPOSABLE) ×4
GOWN STRL REUS W/TWL XL LVL3 (GOWN DISPOSABLE) ×4
GRASPER SUT TROCAR 14GX15 (MISCELLANEOUS) IMPLANT
IRRIGATION STRYKERFLOW (MISCELLANEOUS) IMPLANT
IRRIGATOR STRYKERFLOW (MISCELLANEOUS) ×2
IRRIGATOR SUCT 8 DISP DVNC XI (IRRIGATION / IRRIGATOR) IMPLANT
IRRIGATOR SUCTION 8MM XI DISP (IRRIGATION / IRRIGATOR)
IV NS IRRIG 3000ML ARTHROMATIC (IV SOLUTION) IMPLANT
KIT PINK PAD W/HEAD ARE REST (MISCELLANEOUS) ×2 IMPLANT
KIT PINK PAD W/HEAD ARM REST (MISCELLANEOUS) ×2 IMPLANT
KIT TURNOVER KIT A (KITS) ×2 IMPLANT
LABEL OR SOLS (LABEL) ×2 IMPLANT
MANIFOLD NEPTUNE II (INSTRUMENTS) ×2 IMPLANT
NDL INSUFFLATION 14GA 120MM (NEEDLE) IMPLANT
NEEDLE HYPO 22GX1.5 SAFETY (NEEDLE) ×2 IMPLANT
NEEDLE INSUFFLATION 14GA 120MM (NEEDLE) IMPLANT
NS IRRIG 500ML POUR BTL (IV SOLUTION) ×2 IMPLANT
PACK LAP CHOLECYSTECTOMY (MISCELLANEOUS) ×2 IMPLANT
SEAL CANN UNIV 5-8 DVNC XI (MISCELLANEOUS) ×8 IMPLANT
SEAL XI 5MM-8MM UNIVERSAL (MISCELLANEOUS) ×8
SET TUBE SMOKE EVAC HIGH FLOW (TUBING) ×2 IMPLANT
SOLUTION ELECTROLUBE (MISCELLANEOUS) ×2 IMPLANT
SPIKE FLUID TRANSFER (MISCELLANEOUS) ×2 IMPLANT
SUT MNCRL 4-0 (SUTURE) ×4
SUT MNCRL 4-0 27XMFL (SUTURE) ×4
SUT VICRYL 0 UR6 27IN ABS (SUTURE) ×2 IMPLANT
SUTURE MNCRL 4-0 27XMF (SUTURE) ×2 IMPLANT
SYS BAG RETRIEVAL 10MM (BASKET) ×2
SYSTEM BAG RETRIEVAL 10MM (BASKET) ×2 IMPLANT
TRAP FLUID SMOKE EVACUATOR (MISCELLANEOUS) ×2 IMPLANT
TROCAR Z-THREAD FIOS 11X100 BL (TROCAR) IMPLANT
WATER STERILE IRR 500ML POUR (IV SOLUTION) ×2 IMPLANT

## 2022-04-02 NOTE — Op Note (Signed)
Robotic cholecystectomy with Indocyamine Green Ductal Imaging.   Pre-operative Diagnosis: Chronic calculus cholecystitis  Post-operative Diagnosis:  Same.  Procedure: Robotic assisted laparoscopic cholecystectomy with Indocyamine Green Ductal Imaging.   Surgeon: Ronny Bacon, M.D., FACS  Anesthesia: General. with endotracheal tube  Findings: small volume GB  Estimated Blood Loss: 10 mL         Drains: None         Specimens: Gallbladder           Complications: none  Procedure Details  The patient was seen again in the Holding Room.  1.25 mg dose of ICG was administered intravenously.   The benefits, complications, treatment options, risks and expected outcomes were again reviewed with the patient. The likelihood of improving the patient's symptoms with return to their baseline status is good.  The patient and/or family concurred with the proposed plan, giving informed consent, again alternatives reviewed.  The patient was taken to Operating Room, identified, and the procedure verified as robotic assisted laparoscopic cholecystectomy.  Prior to the induction of general anesthesia, antibiotic prophylaxis was administered. VTE prophylaxis was in place. General endotracheal anesthesia was then administered and tolerated well. The patient was positioned in the supine position.  After the induction, the abdomen was prepped with Chloraprep and draped in the sterile fashion.  A Time Out was held and the above information confirmed.  Right para-umbilical local infiltration with quarter percent Marcaine with epinephrine is utilized.  Made a 12 mm incision on the right periumbilical site, I advanced an optical 70m port under direct visualization into the peritoneal cavity.  Once the peritoneum was penetrated, insufflation was initiated.  The trocar was then advanced into the abdominal cavity under direct visualization. Pneumoperitoneum was then continued utilizing CO2 at 15 mmHg or less and  tolerated well without any adverse changes in the patient's vital signs.  Two 8.5-mm ports were placed in the left lower quadrant and laterally, and one to the right lower quadrant, all under direct vision. All skin incisions  were infiltrated with a local anesthetic agent before making the incision and placing the trocars.  The patient was positioned  in reverse Trendelenburg, tilted the patient's left side down.  Da Vinci XI robot was then positioned on to the patient's left side, and docked.  The gallbladder was identified, the fundus grasped via the arm 4 Prograsp and retracted cephalad. Adhesions were lysed with scissors and cautery.  The infundibulum was identified grasped and retracted laterally, exposing the peritoneum overlying the triangle of Calot. This was then opened and dissected using cautery & scissors. An extended critical view of the cystic plate and duct and cystic artery was obtained, aided by the ICG via FireFly which improved localization of the ductal anatomy.    The cystic duct was clearly identified and dissected to isolation.   Artery well isolated and clipped, and the cystic duct was triple clipped and divided with scissors, as close to the gallbladder neck as feasible, thus leaving two on the remaining stump.  The specimen side of the artery is sealed with bipolar and divided with monopolar scissors.   The gallbladder was taken from the gallbladder fossa in a retrograde fashion with the electrocautery. The gallbladder was removed and placed in an Endocatch bag.  The liver bed is inspected. Hemostasis was confirmed.  The robot was undocked and moved away from the operative field. NSS irrigation was utilized and was aspirated clear.  The gallbladder and Endocatch sac were then removed through the infraumbilical  port site.   Inspection of the right upper quadrant was performed. No bleeding, bile duct injury or leak, or bowel injury was noted. The infra-umbilical port site fascia  was closed with interrumpted 0 Vicryl sutures using PMI/cone under direct visualization. Pneumoperitoneum was released and ports removed.  4-0 subcuticular Monocryl was used to close the skin. Dermabond was  applied.  The patient was then extubated and brought to the recovery room in stable condition. Sponge, lap, and needle counts were correct at closure and at the conclusion of the case.               Ronny Bacon, M.D., [facility] 04/02/2022 11:20 AM

## 2022-04-02 NOTE — Transfer of Care (Signed)
Immediate Anesthesia Transfer of Care Note  Patient: [name].  Procedure(s) Performed: XI ROBOTIC ASSISTED LAPAROSCOPIC CHOLECYSTECTOMY (Abdomen) INDOCYANINE Tobey Lippard FLUORESCENCE IMAGING (ICG)  Patient Location: PACU  Anesthesia Type:General  Level of Consciousness: drowsy  Airway & Oxygen Therapy: Patient Spontanous Breathing and Patient connected to face mask oxygen  Post-op Assessment: Report given to RN, Post -op Vital signs reviewed and stable, and Patient moving all extremities  Post vital signs: Reviewed and stable  Last Vitals:  Vitals Value Taken Time  BP 188/95 04/02/22 1115  Temp 36.5 C 04/02/22 1115  Pulse 62 04/02/22 1118  Resp 16 04/02/22 1118  SpO2 96 % 04/02/22 1118  Vitals shown include unvalidated device data.  Last Pain:  Vitals:   04/02/22 0841  TempSrc: Oral         Complications: No notable events documented.

## 2022-04-02 NOTE — Anesthesia Preprocedure Evaluation (Signed)
Anesthesia Evaluation  Patient identified by MRN, date of birth, ID band Patient awake    Reviewed: Allergy & Precautions, NPO status , Patient's Chart, lab work & pertinent test results  History of Anesthesia Complications Negative for: history of anesthetic complications  Airway Mallampati: II  TM Distance: >3 FB Neck ROM: Full    Dental  (+) Edentulous Upper, Edentulous Lower   Pulmonary asthma , neg sleep apnea, COPD, Patient abstained from smoking.Not current smoker   Pulmonary exam normal breath sounds clear to auscultation       Cardiovascular Exercise Tolerance: Good METShypertension, (-) CAD and (-) Past MI (-) dysrhythmias  Rhythm:Regular Rate:Normal - Systolic murmurs    Neuro/Psych CVA, No Residual Symptoms  negative psych ROS   GI/Hepatic ,GERD  ,,(+)     (-) substance abuse    Endo/Other  diabetes, Poorly Controlled    Renal/GU negative Renal ROS     Musculoskeletal   Abdominal  (+) + obese  Peds  Hematology   Anesthesia Other Findings Past Medical History: No date: Arrhythmia No date: Asthma No date: CCC (chronic calculous cholecystitis) No date: COPD (chronic obstructive pulmonary disease) (HCC) No date: Cyst of kidney, acquired 2013: Diabetes mellitus without complication (HCC)     Comment:  type 2 No date: Diverticulosis No date: Edentulous No date: Fatty liver No date: Gallbladder polyp No date: GERD (gastroesophageal reflux disease) No date: Heart murmur No date: History of chicken pox No date: History of measles as a child No date: History of PKU No date: Hyperlipidemia No date: Hypertension No date: IBS (irritable bowel syndrome) No date: Irregular heart beat No date: Mentally challenged No date: Pancreatitis No date: PONV (postoperative nausea and vomiting) 03/02/2019: Stroke ([place])     Comment:  vision issues - right eye/right sided weakness No date: Vision impairment      Comment:  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  Reproductive/Obstetrics                              Anesthesia Physical Anesthesia Plan  ASA: 3  Anesthesia Plan: General   Post-op Pain Management: Tylenol PO (pre-op)* and Gabapentin PO (pre-op)*   Induction: Intravenous  PONV Risk Score and Plan: 3 and Ondansetron, Dexamethasone and Treatment may vary due to age or medical condition  Airway Management Planned: Oral ETT  Additional Equipment: None  Intra-op Plan:   Post-operative Plan: Extubation in OR  Informed Consent: I have reviewed the patients History and Physical, chart, labs and discussed the procedure including the risks, benefits and alternatives for the proposed anesthesia with the patient or authorized representative who has indicated his/her understanding and acceptance.     Dental advisory given  Plan Discussed with: CRNA and Surgeon  Anesthesia Plan Comments: (Discussed risks of anesthesia with patient, including PONV, sore throat, lip/dental/eye damage. Rare risks discussed as well, such as cardiorespiratory and neurological sequelae, and allergic reactions. Discussed the role of CRNA in patient's perioperative care. Patient understands.)         Anesthesia Quick Evaluation

## 2022-04-02 NOTE — Anesthesia Procedure Notes (Signed)
Procedure Name: Intubation Date/Time: 04/02/2022 9:39 AM  Performed by: Esaw Grandchild, CRNAPre-anesthesia Checklist: Patient identified, Emergency Drugs available, Suction available and Patient being monitored Patient Re-evaluated:Patient Re-evaluated prior to induction Oxygen Delivery Method: Circle system utilized Preoxygenation: Pre-oxygenation with 100% oxygen Induction Type: IV induction Ventilation: Mask ventilation without difficulty and Oral airway inserted - appropriate to patient size Laryngoscope Size: Sabra Heck and 2 Grade View: Grade I Tube type: Oral Tube size: 7.0 mm Number of attempts: 1 Airway Equipment and Method: Stylet, Oral airway and Bite block Placement Confirmation: ETT inserted through vocal cords under direct vision, positive ETCO2 and breath sounds checked- equal and bilateral Secured at: 21 cm Tube secured with: Tape Dental Injury: Teeth and Oropharynx as per pre-operative assessment

## 2022-04-02 NOTE — Interval H&P Note (Signed)
History and Physical Interval Note:  04/02/2022 9:15 AM  [name].  has presented today for surgery, with the diagnosis of chronic calculous cholecystitis.  The various methods of treatment have been discussed with the patient and family. After consideration of risks, benefits and other options for treatment, the patient has consented to  Procedure(s): XI ROBOTIC ASSISTED LAPAROSCOPIC CHOLECYSTECTOMY (N/A) [place] (ICG) (N/A) as a surgical intervention.  The patient's history has been reviewed, patient examined, no change in status, stable for surgery.  I have reviewed the patient's chart and labs.  Questions were answered to the patient's satisfaction.     Ronny Bacon

## 2022-04-02 NOTE — Discharge Instructions (Signed)
AMBULATORY SURGERY  DISCHARGE INSTRUCTIONS   The drugs that you were given will stay in your system until tomorrow so for the next 24 hours you should not:  Drive an automobile Make any legal decisions Drink any alcoholic beverage   You may resume regular meals tomorrow.  Today it is better to start with liquids and gradually work up to solid foods.  You may eat anything you prefer, but it is better to start with liquids, then soup and crackers, and gradually work up to solid foods.   Please notify your doctor immediately if you have any unusual bleeding, trouble breathing, redness and pain at the surgery site, drainage, fever, or pain not relieved by medication.     Your post-operative visit with Dr.                                       is: Date:                        Time:    Please call to schedule your post-operative visit.  Additional Instructions:

## 2022-04-03 ENCOUNTER — Other Ambulatory Visit: Payer: Self-pay | Admitting: Nurse Practitioner

## 2022-04-03 LAB — SURGICAL PATHOLOGY

## 2022-04-03 NOTE — Telephone Encounter (Signed)
Requested Prescriptions  Pending Prescriptions Disp Refills   ONETOUCH ULTRA test strip [Pharmacy Med Name: ONETOUCH ULTRA STRIP] 300 each 0    Sig: TEST BLOOD SUGAR 5 TIMES A DAY     Endocrinology: Diabetes - Testing Supplies Passed - 04/03/2022  9:29 AM      Passed - Valid encounter within last 12 months    Recent Outpatient Visits           1 week ago Type 2 diabetes mellitus with proteinuria ([place])   [place], Jolene T, NP   2 months ago Uncontrolled type 2 diabetes mellitus with hyperglycemia ([place])   [place], Jolene T, NP   5 months ago Uncontrolled type 2 diabetes mellitus with hyperglycemia ([place])   [place], Jolene T, NP   8 months ago NAFLD (nonalcoholic fatty liver disease)   [place], Jolene T, NP   8 months ago Uncontrolled type 2 diabetes mellitus with hyperglycemia ([place])   [place], Barbaraann Faster, NP       Future Appointments             In 2 weeks [place], Tally Due, MD [place]   In 4 weeks [place], Barbaraann Faster, NP [employer], [place]   In 2 months [place], Barbaraann Faster, NP [employer], [facility]

## 2022-04-03 NOTE — Anesthesia Postprocedure Evaluation (Signed)
Anesthesia Post Note  Patient: [name].  Procedure(s) Performed: XI ROBOTIC ASSISTED LAPAROSCOPIC CHOLECYSTECTOMY (Abdomen) INDOCYANINE GREEN FLUORESCENCE IMAGING (ICG)  Patient location during evaluation: PACU Anesthesia Type: General Level of consciousness: awake and alert Pain management: pain level controlled Vital Signs Assessment: post-procedure vital signs reviewed and stable Respiratory status: spontaneous breathing, nonlabored ventilation, respiratory function stable and patient connected to nasal cannula oxygen Cardiovascular status: blood pressure returned to baseline and stable Postop Assessment: no apparent nausea or vomiting Anesthetic complications: no   No notable events documented.   Last Vitals:  Vitals:   04/02/22 1245 04/02/22 1310  BP: (!) 142/88 (!) 173/90  Pulse: (!) 56 (!) 58  Resp: 12 14  Temp: (!) 36.2 C (!) 36.1 C  SpO2: 94% 96%    Last Pain:  Vitals:   04/03/22 0945  TempSrc:   PainSc: 0-No pain                 Arita Miss

## 2022-04-17 ENCOUNTER — Other Ambulatory Visit: Payer: Self-pay

## 2022-04-17 ENCOUNTER — Encounter: Payer: Self-pay | Admitting: Physician Assistant

## 2022-04-17 ENCOUNTER — Ambulatory Visit (INDEPENDENT_AMBULATORY_CARE_PROVIDER_SITE_OTHER): Payer: Medicare Other | Admitting: Physician Assistant

## 2022-04-17 VITALS — BP 158/82 | HR 65 | Temp 97.8°F | Ht 64.0 in | Wt 171.0 lb

## 2022-04-17 DIAGNOSIS — K8010 Calculus of gallbladder with chronic cholecystitis without obstruction: Secondary | ICD-10-CM

## 2022-04-17 DIAGNOSIS — Z09 Encounter for follow-up examination after completed treatment for conditions other than malignant neoplasm: Secondary | ICD-10-CM

## 2022-04-17 NOTE — Progress Notes (Signed)
[facility] POST-OP OFFICE VISIT  04/17/2022  HPI: [name]. is a 62 y.o. male 15 days s/p robotic assisted laparoscopic cholecystectomy for [facility] with Dr Christian Mate   He is doing well No longer having pain; noted ibuprofen helped the most No fever, chills, nausea, emesis, or bowel changes Tolerating PO; no diarrhea Incisions are healing well Ambulating without issues  Vital signs: BP (!) 158/82   Pulse 65   Temp 97.8 F (36.6 C) (Oral)   Ht '5\' 4"'$  (1.626 m)   Wt 171 lb (77.6 kg)   SpO2 98%   BMI 29.35 kg/m    Physical Exam: Constitutional: Well appearing male, NAD Abdomen: Soft, non-tender, non-distended, no rebound/guarding Skin: Laparoscopic incisions are healing well, no erythema or drainage   Assessment/Plan: This is a 62 y.o. male 15 days s/p robotic assisted laparoscopic cholecystectomy for [name] with Dr Christian Mate    - Pain control prn  - Reviewed wound care recommendation  - Reviewed lifting restrictions; 4 weeks total  - Reviewed surgical pathology; [place]  - He can follow up on as needed basis; He understands to call with questions/concerns  -- Edison Simon, PA-C [facility] 04/17/2022, 1:29 PM M-F: 7am - 4pm

## 2022-04-17 NOTE — Patient Instructions (Signed)

## 2022-04-18 ENCOUNTER — Telehealth: Payer: Self-pay | Admitting: Nurse Practitioner

## 2022-04-18 DIAGNOSIS — E1159 Type 2 diabetes mellitus with other circulatory complications: Secondary | ICD-10-CM | POA: Diagnosis not present

## 2022-04-18 DIAGNOSIS — J45909 Unspecified asthma, uncomplicated: Secondary | ICD-10-CM | POA: Diagnosis not present

## 2022-04-18 DIAGNOSIS — I10 Essential (primary) hypertension: Secondary | ICD-10-CM | POA: Diagnosis not present

## 2022-04-18 NOTE — Telephone Encounter (Signed)
Spoke with patient spouse stated to give in a call back he was not at home.

## 2022-04-20 ENCOUNTER — Telehealth: Payer: Self-pay

## 2022-04-20 ENCOUNTER — Telehealth: Payer: Medicare Other

## 2022-04-20 NOTE — Telephone Encounter (Signed)
   CCM RN Visit Note   04-20-2022 Name: [name]. MRN: [id number]      DOB: 1960-07-23  Subjective: [name]. is a 62 y.o. year old male who is a primary care patient of Marnee Guarneri, NPThe patient was referred to the Chronic Care Management team for assistance with care management needs subsequent to provider initiation of CCM services and plan of care.      An unsuccessful telephone outreach was attempted today to contact the patient about Chronic Care Management needs.    Plan:A HIPAA compliant phone message was left for the patient providing contact information and requesting a return call.  Noreene Larsson RN, MSN, CCM RN Care Manager  Chronic Care Management Direct Number: [phone]

## 2022-04-23 ENCOUNTER — Ambulatory Visit: Payer: Medicare Other | Admitting: Gastroenterology

## 2022-04-25 ENCOUNTER — Ambulatory Visit (INDEPENDENT_AMBULATORY_CARE_PROVIDER_SITE_OTHER): Payer: Medicare Other

## 2022-04-25 ENCOUNTER — Telehealth: Payer: Medicare Other

## 2022-04-25 ENCOUNTER — Telehealth: Payer: Self-pay

## 2022-04-25 DIAGNOSIS — E1159 Type 2 diabetes mellitus with other circulatory complications: Secondary | ICD-10-CM

## 2022-04-25 DIAGNOSIS — E1165 Type 2 diabetes mellitus with hyperglycemia: Secondary | ICD-10-CM

## 2022-04-25 DIAGNOSIS — E1129 Type 2 diabetes mellitus with other diabetic kidney complication: Secondary | ICD-10-CM

## 2022-04-25 NOTE — Telephone Encounter (Signed)
   CCM RN Visit Note   04-25-2022 Name: [name]. MRN: [id number]      DOB: 1960/07/14  Subjective: [name]. is a 62 y.o. year old male who is a primary care patient of Marnee Guarneri, NP. The patient was referred to the Chronic Care Management team for assistance with care management needs subsequent to provider initiation of CCM services and plan of care.      An unsuccessful telephone outreach was attempted today to contact the patient about Chronic Care Management needs.    Plan:A HIPAA compliant phone message was left for the patient providing contact information and requesting a return call.  Noreene Larsson RN, MSN, CCM RN Care Manager  Chronic Care Management Direct Number: [phone]

## 2022-04-25 NOTE — Chronic Care Management (AMB) (Signed)
Chronic Care Management   CCM RN Visit Note  04/25/2022 Name: Deontae Robson. MRN: [id number] DOB: 01/15/1961  Subjective: Delrae Alfred Kevyn Boquet. is a 62 y.o. year old male who is a primary care patient of Cannady, Barbaraann Faster, NP. The patient was referred to the Chronic Care Management team for assistance with care management needs subsequent to provider initiation of CCM services and plan of care.    Today's Visit:  Engaged with patient by telephone for follow up visit.        Goals Addressed             This Visit's Progress    CCM Expected Outcome:  Monitor, Self-Manage and Reduce Symptoms of Diabetes       Current Barriers:  Knowledge Deficits related to the need to check blood sugars and keep blood sugars in normal range Care Coordination needs related to the importance of monitoring blood sugars, eating healthy, maintaining good control of DM in a patient with DM Chronic Disease Management support and education needs related to effective management of DM Literacy barriers  Lab Results  Component Value Date   HGBA1C 9.9 (H) 03/23/2022     Planned Interventions: Provided education to patient about basic DM disease process. The patient needs ongoing support and education with effective management of DM. The patients wife states currently he is eating better and checking his blood sugars and writing them down; Reviewed medications with patient and discussed importance of medication adherence. The patient is compliant with medications. The endocrinologist wants him to wear an insulin pump. The patients wife states that when he goes to the Endocrinologist that he is going to tell the doctor he cannot do the insulin pump. Reflective listening and support given.      Reviewed prescribed diet with patient heart healthy/ADA. The patient is doing better with ADA and options that will help with maintaining healthy blood sugars but having a hard time refraining from salt ; Counseled  on importance of regular laboratory monitoring as prescribed;        Discussed plans with patient for ongoing care management follow up and provided patient with direct contact information for care management team;      Provided patient with written educational materials related to hypo and hyperglycemia and importance of correct treatment. The patient has had a couple falls due to his blood sugars getting down to 40's. The patients wife states he was concerned about his blood sugars being too high so he has not been eating like he was supposed to. Education on safety and monitoring for changes in the way he feels. Denies and lows. Highest he has seen is a couple >300.        Reviewed scheduled/upcoming provider appointments including: 06-14-2022 at 1145 am        Advised patient, providing education and rationale, to check cbg before meals and at bedtime and when you have symptoms of low or high blood sugar and record. The endocrinologist had given the patient a continuous glucose reader but he kept knocking it off of his arm. He does not wish to keep wearing this. Education on monitoring for changes and letting the provider know for acute changes.   The patients stated blood sugar today was 128 His wife is encouraging him to  eat better and they have others bringing in other healthy food options.       call provider for findings outside established parameters;  Review of patient status, including review of consultants reports, relevant laboratory and other test results, and medications completed;       Advised patient to discuss changes in DM, questions, or concerns with provider;     Sees eye specialist in January. Is possibly going to have new surgery to help vision  Screening for signs and symptoms of depression related to chronic disease state;        Assessed social determinant of health barriers;         Symptom Management: Take medications as prescribed   Attend all scheduled provider  appointments Call provider office for new concerns or questions  call the Suicide and Crisis Lifeline: 988 call the [place] National Suicide Prevention Lifeline: (419)347-0166 or TTY: 251-544-5454 TTY 541-575-6532) to talk to a trained counselor call 1-800-273-TALK (toll free, 24 hour hotline) if experiencing a Mental Health or [place]  keep appointment with eye doctor check feet daily for cuts, sores or redness trim toenails straight across manage portion size wash and dry feet carefully every day wear comfortable, cotton socks wear comfortable, well-fitting shoes  Follow Up Plan: Telephone follow up appointment with care management team member scheduled for: 06-14-2022 at 1145 am       CCM Expected Outcome:  Monitor, Self-Manage, and Reduce Symptoms of Hypertension       Current Barriers:  Knowledge Deficits related to the importance for following a heart healthy/ADA diet and the need to restrict sodium in dietary habits  Chronic Disease Management support and education needs related to effective management of HTN  BP Readings from Last 3 Encounters:  04/17/22 (!) 158/82  04/02/22 (!) 173/90  03/29/22 (!) 170/82     Planned Interventions: Evaluation of current treatment plan related to hypertension self management and patient's adherence to plan as established by provider. The patient with elevated blood pressures recently.  The patient had successful surgery to remove his gallbladder on 04-02-2022 and his blood pressures are now trending down. His wife states that he is doing well and his pain is much improved. She states that he is trying to do good. She is trying to keep him away from salt. That is the challenge right now. Education and support given.  Provided education to patient re: stroke prevention, s/s of heart attack and stroke; Reviewed prescribed diet heart healthy/ADA diet. The patient is more mindful of what he is eating and watching what he eats. He does  like salt and sometimes uses too much salt. This is an ongoing obstacle but his wife states she is being diligent in trying to get him to understand the importance of not eating salt and this will help his blood pressures too.  Education and support given.  Reviewed medications with patient and discussed importance of compliance. The patient states compliance with his medications. ;  Discussed plans with patient for ongoing care management follow up and provided patient with direct contact information for care management team; Advised patient, providing education and rationale, to monitor blood pressure daily and record, calling PCP for findings outside established parameters;  Reviewed scheduled/upcoming provider appointments including: 06-25-2022 at 140 pm Advised patient to discuss changes in blood pressures or heart health with provider; Provided education on prescribed diet heart healthy/ADA diet. Education and support given. Review of heart healthy/ADA choices ;  Discussed complications of poorly controlled blood pressure such as heart disease, stroke, circulatory complications, vision complications, kidney impairment, sexual dysfunction;  Screening for signs and symptoms of depression related to  chronic disease state;  Assessed social determinant of health barriers;   Symptom Management: Take medications as prescribed   Attend all scheduled provider appointments Call provider office for new concerns or questions  call the Suicide and Crisis Lifeline: 988 call the [place] National Suicide Prevention Lifeline: 6570193463 or TTY: 313-452-2185 TTY 7707063978) to talk to a trained counselor call 1-800-273-TALK (toll free, 24 hour hotline) if experiencing a Mental Health or [place]  check blood pressure 3 times per week learn about high blood pressure keep a blood pressure log take blood pressure log to all doctor appointments call doctor for signs and symptoms of high blood  pressure develop an action plan for high blood pressure keep all doctor appointments take medications for blood pressure exactly as prescribed report new symptoms to your doctor  Follow Up Plan: Telephone follow up appointment with care management team member scheduled for: 06-14-2022 at 1145 am          Plan:Telephone follow up appointment with care management team member scheduled for:  06-14-2022 at 1145 am  Noreene Larsson RN, MSN, CCM RN Care Manager  Chronic Care Management Direct Number: [phone]

## 2022-04-25 NOTE — Patient Instructions (Signed)
Please call the care guide team at [phone] if you need to cancel or reschedule your appointment.   If you are experiencing a Mental Health or [place] or need someone to talk to, please call the Suicide and Crisis Lifeline: 988 call the [place] National Suicide Prevention Lifeline: (612)100-0353 or TTY: (808) 295-0044 TTY 5621912627) to talk to a trained counselor call 1-800-273-TALK (toll free, 24 hour hotline)   Following is a copy of the CCM Program Consent:  CCM service includes personalized support from designated clinical staff supervised by the physician, including individualized plan of care and coordination with other care providers 24/7 contact phone numbers for assistance for urgent and routine care needs. Service will only be billed when office clinical staff spend 20 minutes or more in a month to coordinate care. Only one practitioner may furnish and bill the service in a calendar month. The patient may stop CCM services at amy time (effective at the end of the month) by phone call to the office staff. The patient will be responsible for cost sharing (co-pay) or up to 20% of the service fee (after annual deductible is met)  Following is a copy of your full provider care plan:   Goals Addressed             This Visit's Progress    CCM Expected Outcome:  Monitor, Self-Manage and Reduce Symptoms of Diabetes       Current Barriers:  Knowledge Deficits related to the need to check blood sugars and keep blood sugars in normal range Care Coordination needs related to the importance of monitoring blood sugars, eating healthy, maintaining good control of DM in a patient with DM Chronic Disease Management support and education needs related to effective management of DM Literacy barriers  Lab Results  Component Value Date   HGBA1C 9.9 (H) 03/23/2022     Planned Interventions: Provided education to patient about basic DM disease process. The patient needs  ongoing support and education with effective management of DM. The patients wife states currently he is eating better and checking his blood sugars and writing them down; Reviewed medications with patient and discussed importance of medication adherence. The patient is compliant with medications. The endocrinologist wants him to wear an insulin pump. The patients wife states that when he goes to the Endocrinologist that he is going to tell the doctor he cannot do the insulin pump. Reflective listening and support given.      Reviewed prescribed diet with patient heart healthy/ADA. The patient is doing better with ADA and options that will help with maintaining healthy blood sugars but having a hard time refraining from salt ; Counseled on importance of regular laboratory monitoring as prescribed;        Discussed plans with patient for ongoing care management follow up and provided patient with direct contact information for care management team;      Provided patient with written educational materials related to hypo and hyperglycemia and importance of correct treatment. The patient has had a couple falls due to his blood sugars getting down to 40's. The patients wife states he was concerned about his blood sugars being too high so he has not been eating like he was supposed to. Education on safety and monitoring for changes in the way he feels. Denies and lows. Highest he has seen is a couple >300.        Reviewed scheduled/upcoming provider appointments including: 06-14-2022 at 1145 am  Advised patient, providing education and rationale, to check cbg before meals and at bedtime and when you have symptoms of low or high blood sugar and record. The endocrinologist had given the patient a continuous glucose reader but he kept knocking it off of his arm. He does not wish to keep wearing this. Education on monitoring for changes and letting the provider know for acute changes.   The patients stated blood  sugar today was 128 His wife is encouraging him to  eat better and they have others bringing in other healthy food options.       call provider for findings outside established parameters;       Review of patient status, including review of consultants reports, relevant laboratory and other test results, and medications completed;       Advised patient to discuss changes in DM, questions, or concerns with provider;     Sees eye specialist in January. Is possibly going to have new surgery to help vision  Screening for signs and symptoms of depression related to chronic disease state;        Assessed social determinant of health barriers;         Symptom Management: Take medications as prescribed   Attend all scheduled provider appointments Call provider office for new concerns or questions  call the Suicide and Crisis Lifeline: 988 call the [place] National Suicide Prevention Lifeline: (434)620-6167 or TTY: 272 749 7368 TTY 773-178-3680) to talk to a trained counselor call 1-800-273-TALK (toll free, 24 hour hotline) if experiencing a Mental Health or [place]  keep appointment with eye doctor check feet daily for cuts, sores or redness trim toenails straight across manage portion size wash and dry feet carefully every day wear comfortable, cotton socks wear comfortable, well-fitting shoes  Follow Up Plan: Telephone follow up appointment with care management team member scheduled for: 06-14-2022 at 1145 am       CCM Expected Outcome:  Monitor, Self-Manage, and Reduce Symptoms of Hypertension       Current Barriers:  Knowledge Deficits related to the importance for following a heart healthy/ADA diet and the need to restrict sodium in dietary habits  Chronic Disease Management support and education needs related to effective management of HTN  BP Readings from Last 3 Encounters:  04/17/22 (!) 158/82  04/02/22 (!) 173/90  03/29/22 (!) 170/82     Planned  Interventions: Evaluation of current treatment plan related to hypertension self management and patient's adherence to plan as established by provider. The patient with elevated blood pressures recently.  The patient had successful surgery to remove his gallbladder on 04-02-2022 and his blood pressures are now trending down. His wife states that he is doing well and his pain is much improved. She states that he is trying to do good. She is trying to keep him away from salt. That is the challenge right now. Education and support given.  Provided education to patient re: stroke prevention, s/s of heart attack and stroke; Reviewed prescribed diet heart healthy/ADA diet. The patient is more mindful of what he is eating and watching what he eats. He does like salt and sometimes uses too much salt. This is an ongoing obstacle but his wife states she is being diligent in trying to get him to understand the importance of not eating salt and this will help his blood pressures too.  Education and support given.  Reviewed medications with patient and discussed importance of compliance. The patient states compliance with his medications. ;  Discussed plans with patient for ongoing care management follow up and provided patient with direct contact information for care management team; Advised patient, providing education and rationale, to monitor blood pressure daily and record, calling PCP for findings outside established parameters;  Reviewed scheduled/upcoming provider appointments including: 06-25-2022 at 140 pm Advised patient to discuss changes in blood pressures or heart health with provider; Provided education on prescribed diet heart healthy/ADA diet. Education and support given. Review of heart healthy/ADA choices ;  Discussed complications of poorly controlled blood pressure such as heart disease, stroke, circulatory complications, vision complications, kidney impairment, sexual dysfunction;  Screening for  signs and symptoms of depression related to chronic disease state;  Assessed social determinant of health barriers;   Symptom Management: Take medications as prescribed   Attend all scheduled provider appointments Call provider office for new concerns or questions  call the Suicide and Crisis Lifeline: 988 call the [place] National Suicide Prevention Lifeline: 763-232-5843 or TTY: 260-041-6747 TTY (818)836-1739) to talk to a trained counselor call 1-800-273-TALK (toll free, 24 hour hotline) if experiencing a Mental Health or [place]  check blood pressure 3 times per week learn about high blood pressure keep a blood pressure log take blood pressure log to all doctor appointments call doctor for signs and symptoms of high blood pressure develop an action plan for high blood pressure keep all doctor appointments take medications for blood pressure exactly as prescribed report new symptoms to your doctor  Follow Up Plan: Telephone follow up appointment with care management team member scheduled for: 06-14-2022 at 1145 am          The patient verbalized understanding of instructions, educational materials, and care plan provided today and DECLINED offer to receive copy of patient instructions, educational materials, and care plan.   Telephone follow up appointment with care management team member scheduled for: 06-14-2022 at 1145 am

## 2022-04-26 ENCOUNTER — Telehealth: Payer: Medicare Other

## 2022-04-30 DIAGNOSIS — G459 Transient cerebral ischemic attack, unspecified: Secondary | ICD-10-CM | POA: Diagnosis not present

## 2022-04-30 DIAGNOSIS — K219 Gastro-esophageal reflux disease without esophagitis: Secondary | ICD-10-CM | POA: Diagnosis not present

## 2022-04-30 DIAGNOSIS — I10 Essential (primary) hypertension: Secondary | ICD-10-CM | POA: Diagnosis not present

## 2022-04-30 DIAGNOSIS — E7849 Other hyperlipidemia: Secondary | ICD-10-CM | POA: Diagnosis not present

## 2022-04-30 DIAGNOSIS — E1165 Type 2 diabetes mellitus with hyperglycemia: Secondary | ICD-10-CM | POA: Diagnosis not present

## 2022-05-02 ENCOUNTER — Ambulatory Visit: Payer: Medicare Other | Admitting: Nurse Practitioner

## 2022-05-11 DIAGNOSIS — I10 Essential (primary) hypertension: Secondary | ICD-10-CM | POA: Diagnosis not present

## 2022-05-11 DIAGNOSIS — K219 Gastro-esophageal reflux disease without esophagitis: Secondary | ICD-10-CM | POA: Diagnosis not present

## 2022-05-11 DIAGNOSIS — E1165 Type 2 diabetes mellitus with hyperglycemia: Secondary | ICD-10-CM | POA: Diagnosis not present

## 2022-05-11 DIAGNOSIS — G459 Transient cerebral ischemic attack, unspecified: Secondary | ICD-10-CM | POA: Diagnosis not present

## 2022-05-11 DIAGNOSIS — N183 Chronic kidney disease, stage 3 unspecified: Secondary | ICD-10-CM | POA: Diagnosis not present

## 2022-05-14 ENCOUNTER — Ambulatory Visit: Payer: Medicare Other

## 2022-05-14 NOTE — Patient Outreach (Signed)
Care Management & Coordination Services Pharmacy Note  05/14/2022 Name:  [name]. MRN:  [id number] DOB:  1960-12-23  Summary: -Very pleasant patient and his wife ([name]) who manages all his healthcare. She loves animals and even saved an animal while visiting the zoo one time!  Recommendations/Changes made from today's visit: -Onboard to [facility]. Patient very non-compliant  Subjective: [name]. is an 62 y.o. year old male who is a primary patient of [name], Alex Faster, NP.  The care coordination team was consulted for assistance with disease management and care coordination needs.    Engaged with patient by telephone for follow up visit.   Objective:  Lab Results  Component Value Date   CREATININE 0.92 03/23/2022   BUN 12 03/23/2022   EGFR 96 03/23/2022   GFRNONAA 66 02/24/2020   GFRAA 76 02/24/2020   NA 137 03/23/2022   K 4.5 03/23/2022   CALCIUM 9.4 03/23/2022   CO2 21 03/23/2022   GLUCOSE 307 (H) 03/23/2022    Lab Results  Component Value Date/Time   HGBA1C 9.9 (H) 03/23/2022 10:08 AM   HGBA1C 10.0 (H) 10/27/2021 04:03 PM   MICROALBUR 150 (H) 03/23/2022 10:08 AM   MICROALBUR 10 04/26/2021 02:22 PM   MICROALBUR 20 08/22/2015 05:04 PM    Last diabetic Eye exam:  Lab Results  Component Value Date/Time   HMDIABEYEEXA No Retinopathy 05/25/2021 12:00 AM    Last diabetic Foot exam: No results found for: "HMDIABFOOTEX"   Lab Results  Component Value Date   CHOL 193 03/28/2022   HDL 26 (L) 03/28/2022   LDLCALC 90 03/28/2022   TRIG 469 (H) 03/28/2022   CHOLHDL 5.3 03/03/2019       Latest Ref Rng & Units 03/23/2022   10:10 AM 10/27/2021    4:06 PM 04/26/2021    2:25 PM  Hepatic Function  Total Protein 6.0 - 8.5 g/dL 7.2  6.6  7.1   Albumin 3.8 - 4.9 g/dL 4.7  4.3  4.8   AST 0 - 40 IU/L 32  24  24   ALT 0 - 44 IU/L 46  26  36   Alk Phosphatase 44 - 121 IU/L 186  152  148   Total Bilirubin 0.0 - 1.2 mg/dL 0.3  0.3  0.5     Lab  Results  Component Value Date/Time   TSH 1.070 03/23/2022 10:10 AM   TSH 1.130 04/26/2021 02:25 PM       Latest Ref Rng & Units 03/23/2022   10:10 AM 04/26/2021    2:25 PM 01/18/2021    2:31 PM  CBC  WBC 3.4 - 10.8 x10E3/uL 5.1  4.8  5.0   Hemoglobin 13.0 - 17.7 g/dL 15.9  14.6  13.4   Hematocrit 37.5 - 51.0 % 48.9  43.9  42.6   Platelets 150 - 450 x10E3/uL 198  174  176     Lab Results  Component Value Date/Time   VD25OH 16.1 (L) 03/23/2022 10:10 AM   VD25OH 14.4 (L) 01/30/2022 03:33 PM   VITAMINB12 312 01/30/2022 03:33 PM   VITAMINB12 251 04/26/2021 02:25 PM    Clinical ASCVD: Yes  The 10-year ASCVD risk score (Arnett DK, et al., 2019) is: 35.5%   Values used to calculate the score:     Age: 63 years     Sex: Male     Is Non-Hispanic African American: No     Diabetic: Yes     Tobacco smoker: No  Systolic Blood Pressure: 0000000 mmHg     Is BP treated: Yes     HDL Cholesterol: 26 mg/dL     Total Cholesterol: 193 mg/dL    Other: (CHADS2VASc if Afib, MMRC or CAT for COPD, ACT, DEXA)     12/25/2021    5:05 PM 08/01/2021    1:23 PM 07/26/2021    3:16 PM  Depression screen PHQ 2/9  Decreased Interest 0 3 0  Down, Depressed, Hopeless 0 0 0  PHQ - 2 Score 0 3 0  Altered sleeping  0 0  Tired, decreased energy  3 3  Change in appetite  0 0  Feeling bad or failure about yourself   0 0  Trouble concentrating  1 1  Moving slowly or fidgety/restless  1 3  Suicidal thoughts  0 0  PHQ-9 Score  8 7     Social History   Tobacco Use  Smoking Status Never   Passive exposure: Never  Smokeless Tobacco Never   BP Readings from Last 3 Encounters:  04/17/22 (!) 158/82  04/02/22 (!) 173/90  03/29/22 (!) 170/82   Pulse Readings from Last 3 Encounters:  04/17/22 65  04/02/22 (!) 58  03/29/22 66   Wt Readings from Last 3 Encounters:  04/17/22 171 lb (77.6 kg)  04/02/22 174 lb 15.7 oz (79.4 kg)  03/29/22 175 lb (79.4 kg)   BMI Readings from Last 3 Encounters:  04/17/22  29.35 kg/m  04/02/22 30.04 kg/m  03/29/22 30.04 kg/m    Allergies  Allergen Reactions   Sulfa Antibiotics Shortness Of Breath and Nausea And Vomiting   Bee Venom Hives and Swelling    All kinds of bees   Morphine Nausea And Vomiting   Other     Certain powders    Medications Reviewed Today     Reviewed by Alex Ingles, RN (Case Manager) on 04/25/22 at [facility] List Status: <None>   Medication Order Taking? Sig Documenting Provider Last Dose Status Informant  aspirin 81 MG EC tablet [id number] No Take 1 tablet (81 mg total) by mouth daily. Alex Guarneri T, NP Taking Active   Blood Glucose Monitoring Suppl (ONE TOUCH ULTRA 2) w/Device KIT [id number] No Use to check blood sugar 4 times a day [name], Alex T, NP Taking Active   BREO ELLIPTA 100-25 MCG/ACT AEPB [id number] No INHALE 1 PUFF BY MOUTH ONCE DAILY  Patient taking differently: 1 puff as needed.   Alex Guarneri T, NP Taking Active   Cholecalciferol 1.25 MG (50000 UT) TABS [id number] No Take 1 tablet by mouth once a week.  Patient taking differently: Take 1 tablet by mouth once a week. Mondays   Alex Guarneri T, NP Taking Active   clopidogrel (PLAVIX) 75 MG tablet [id number] No Take 1 tablet by mouth daily. [provider] Taking Active   EPINEPHRINE 0.3 mg/0.3 mL IJ SOAJ injection [id number] No INJECT 1 SYRINGE INTO OUTER THIGH ONCE AS NEEDED FOR SEVERE ALLERGIC REACTION.  Patient taking differently: Inject 0.3 mg into the muscle as needed.   Alex Guarneri T, NP Taking Active   fenofibrate 54 MG tablet [id number] No TAKE 1 TABLET BY MOUTH ONCE DAILY  Patient taking differently: Take 54 mg by mouth every morning.   Alex Guarneri T, NP Taking Active   gabapentin (NEURONTIN) 300 MG capsule [id number] No TAKE 1 CAPSULE BY MOUTH TWICE DAILY  Patient taking differently: Take 300 mg by mouth 2 (two) times daily.   Alex Lick, NP  Taking Active   glucose blood (ONETOUCH ULTRA) test strip [id number] No TEST  BLOOD SUGAR 5 TIMES A DAY [name], Alex T, NP Taking Active   ibuprofen (ADVIL) 800 MG tablet [id number] No Take 1 tablet (800 mg total) by mouth every 8 (eight) hours as needed. Alex Bacon, MD Taking Active   icosapent Ethyl (VASCEPA) 1 g capsule [id number] No Take 1 g by mouth 2 (two) times daily. [provider] Taking Active   Insulin Degludec (TRESIBA) 100 UNIT/ML SOLN [id number] No Inject 40 Units into the skin 2 (two) times daily. [provider] Taking Active   Insulin Disposable Pump (OMNIPOD DASH PDM, GEN 4,) KIT [id number] No AS PRESCRIBED USE AS DIRECTED BASED ON TOTAL DAILY INSULIN AND POD CHANGE FREQUENCY [provider] Taking Active   Insulin Pen Needle (PEN NEEDLES) 32G X 4 MM MISC [id number] No 1 kit by Does not apply route daily. Alex Guarneri T, NP Taking Active   JARDIANCE 25 MG TABS tablet [id number] No TAKE 1 TABLET BY MOUTH ONCE DAILY  Patient taking differently: 25 mg every morning.   Alex Lick, NP Taking Active   Lancets [facility] DELICA PLUS [id number]) MISC [id number] No USE AS DIRECTED. Alex Guarneri T, NP Taking Active   lisinopril (ZESTRIL) 20 MG tablet [id number] No TAKE 1 TABLET BY MOUTH ONCE DAILY  Patient taking differently: Take 20 mg by mouth every morning.   Alex Guarneri T, NP Taking Active   LYUMJEV KWIKPEN 100 UNIT/ML KwikPen [id number] No Inject into the skin 3 (three) times daily before meals. Per endocrinology directions-SS [provider] Taking Active   Misc. Devices (PULSE OXIMETER FOR FINGER) MISC [id number] No To check O2 saturations once daily with asthma and document + check if any shortness of breath [name], Alex T, NP Taking Active   montelukast (SINGULAIR) 10 MG tablet [id number] No TAKE 1 TABLET BY MOUTH AT BEDTIME  Patient taking differently: Take 10 mg by mouth at bedtime.   Alex Guarneri T, NP Taking Active   nitroGLYCERIN (NITROSTAT) 0.4 MG SL tablet [id number] No Place 1 tablet (0.4 mg  total) under the tongue every 5 (five) minutes as needed for chest pain. Alex Guarneri T, NP Taking Active   omeprazole (PRILOSEC) 20 MG capsule [id number] No TAKE 1 CAPSULE BY MOUTH ONCE DAILY  Patient taking differently: 20 mg every morning.   Alex Guarneri T, NP Taking Active   rosuvastatin (CRESTOR) 40 MG tablet [id number] No Take 1 tablet (40 mg total) by mouth daily.  Patient taking differently: Take 40 mg by mouth at bedtime.   Alex Guarneri T, NP Taking Active   sitaGLIPtin (JANUVIA) 100 MG tablet [id number] No Take 1 tablet (100 mg total) by mouth daily.  Patient taking differently: Take 100 mg by mouth every morning.   Alex Guarneri T, NP Taking Active   tiZANidine (ZANAFLEX) 2 MG tablet [id number] No Take 1 tablet (2 mg total) by mouth every 6 (six) hours as needed for muscle spasms.  Patient taking differently: Take 2 mg by mouth every 6 (six) hours.   Alex Guarneri T, NP Taking Active   traMADol (ULTRAM) 50 MG tablet [id number] No Take 50 mg by mouth every 6 (six) hours. [provider] Taking Active   triamcinolone cream (KENALOG) 0.1 % [id number] No Apply 1 Application topically 2 (two) times daily.  Patient taking differently: Apply 1 Application topically 2 (two) times daily. Arms and legs   [name], Alex Faster, NP Taking Active   ULTRACARE PEN  NEEDLES 32G X 5 MM MISC [id number] No  [provider] Taking Active   valACYclovir (VALTREX) 1000 MG tablet [id number] No Take 500 mg by mouth 3 (three) times daily. [provider] Taking Active             SDOH:  (Social Determinants of Health) assessments and interventions performed: Yes SDOH Interventions    [place] Coordination from 05/14/2022 in [place] Most recent reading at 05/14/2022  1:39 PM Chronic Care Management from 02/07/2022 in [place] Most recent reading at 02/07/2022  9:20 AM Care Coordination from 12/25/2021 in [place] Most recent reading at 12/25/2021  5:07 PM Chronic Care Management from 12/25/2021 in [place] Most recent reading at 12/25/2021 12:11 PM Telephone from 11/22/2021 in [place] Most recent reading at 11/22/2021 12:47 PM Care Coordination from 11/08/2021 in [place] Most recent reading at 11/08/2021  1:23 PM  SDOH Interventions        Food Insecurity Interventions -- Other (Comment)  [uses local food banks] Other (Comment)  [currenlty has resources for area food banks] -- -- Intervention Not Indicated  Housing Interventions -- Intervention Not Indicated Intervention Not Indicated -- -- Intervention Not Indicated  Transportation Interventions -- Intervention Not Indicated Intervention Not Indicated Intervention Not Indicated Other (Comment)  [Patient has transportation benefit with [employer]. He stated that he is able to drive his car and does not need transportation at this time.] Intervention Not Indicated  Utilities Interventions -- Intervention Not Indicated Intervention Not Indicated -- -- --  Alcohol Usage Interventions -- Intervention Not Indicated (Score <7) -- -- -- --  Financial Strain Interventions Other (Comment)  [Refer to Pam] Other (Comment)  [open to resources to help with bills and needs met] Other (Comment)  [plan to apply for charity care for hospital bills] Other (Comment)  [Coordinate with SW] -- --  Physical Activity Interventions Patient Refused Other (Comments)  [encouraged activity, does not do structured activity] -- -- -- --  Stress Interventions -- Other (Comment)  [has multiple chronic conditions that impact his care] -- -- -- Other (Comment)  [the patient was upset this week at the eye doctor, doing better today]  Social Connections Interventions -- Intervention Not Indicated, Other (Comment)  [has good family support system] -- -- -- --        Medication Assistance: None required.  Patient affirms current coverage meets needs.    [facility] Reviewed: Is patient disadvantaged to use [facility]?: No  Name and location of Current pharmacy:  [place], [place]. [place] [address] Phone: [phone] Fax: [phone]  [facility] services reviewed with patient today?: Yes  Patient requests to transfer care to [facility]?: Yes  -Verbal consent obtained for [facility] enhanced pharmacy services (medication synchronization, adherence packaging, delivery coordination). A medication sync plan was created to allow patient to get all medications delivered once every 30 to 90 days per patient preference. Patient understands they have freedom to choose pharmacy and clinical pharmacist will coordinate care between all prescribers and [facility].   Assessment/Plan   Hypertension (BP goal <130/80) BP Readings from Last 3 Encounters:  04/17/22 (!) 158/82  04/02/22 (!) 173/90  03/29/22 (!) 170/82   -Uncontrolled -Current treatment: Lisinopril '20mg'$  Appropriate, Query effective,  -Medications previously tried: N/A  -Current home readings:  July 2023: 10/15/21: 118/68 10/14/21: 165/65 120/75 155/75 135/65 October 2023 Didn'Rogers have readings -Current dietary habits: "Tries to eat healthy" -Current exercise habits: none -Denies hypotensive/hypertensive symptoms -Educated on BP goals and benefits of medications for prevention of heart attack, stroke and kidney damage; -Counseled to monitor BP at home daily, document, and provide log at future appointments July 2023: BP high, let PCP know October 2023: Was at [facility] for wife so didn'Rogers have data in front of him Feb 2024: Didn'Rogers have BP readings in front of them   CAD: (LDL goal < 70) The 10-year ASCVD risk score (Arnett DK, et al., 2019) is: 35.5%   Values used to calculate the score:     Age: 22  years     Sex: Male     Is Non-Hispanic African American: No     Diabetic: Yes     Tobacco smoker: No     Systolic Blood Pressure: 0000000 mmHg     Is BP treated: Yes     HDL Cholesterol: 26 mg/dL     Total Cholesterol: 193 mg/dL Lab Results  Component Value Date   CHOL 193 03/28/2022   CHOL 240 (H) 03/23/2022   CHOL 277 (H) 01/30/2022   Lab Results  Component Value Date   HDL 26 (L) 03/28/2022   HDL 30 (L) 03/23/2022   HDL 20 (L) 01/30/2022   Lab Results  Component Value Date   LDLCALC 90 03/28/2022   LDLCALC 97 03/23/2022   LDLCALC Comment (A) 01/30/2022   Lab Results  Component Value Date   TRIG 469 (H) 03/28/2022   TRIG 665 ([place]) 03/23/2022   TRIG 1,384 ([place]) 01/30/2022   Lab Results  Component Value Date   CHOLHDL 5.3 03/03/2019   CHOLHDL 3.9 08/04/2018   CHOLHDL 4.2 05/22/2017   No results found for: "LDLDIRECT" Last vitamin D Lab Results  Component Value Date   VD25OH 16.1 (L) 03/23/2022   Lab Results  Component Value Date   TSH 1.070 03/23/2022  -Uncontrolled -CVA Hx noted - high risk. LDL goal at least <70 -Current treatment: Fenofibrate 54 mg Appropriate, Query effective,  Atorvastatin 80 mg Appropriate, Query effective,  Clopidogrel '75mg'$  Appropriate, Effective, Safe, Accessible ASA '81mg'$  Appropriate, Effective, Safe, Accessible Vascepa Appropriate, Query effective,  -reviewed for intolerance/side effects - no problems noted    -Educated on Cholesterol goals;  Benefits of statin for ASCVD risk reduction; -Counseled on diet and exercise extensively July 2023: Recommend Ezetimibe for LDL.    Diabetes (A1c goal <8%) Lab Results  Component Value Date   HGBA1C 9.9 (H) 03/23/2022   HGBA1C 10.0 (H) 10/27/2021   HGBA1C 8.8 (H) 07/26/2021   Lab Results  Component Value Date   MICROALBUR 150 (H) 03/23/2022   LDLCALC 90 03/28/2022   CREATININE 0.92 03/23/2022    Lab Results  Component Value Date   NA 137 03/23/2022   K 4.5 03/23/2022    CREATININE 0.92 03/23/2022   EGFR 96 03/23/2022   GFRNONAA 66 02/24/2020   GLUCOSE 307 (H) 03/23/2022    Lab Results  Component Value Date   WBC 5.1 03/23/2022   HGB 15.9 03/23/2022   HCT 48.9 03/23/2022   MCV 94 03/23/2022   PLT 198 03/23/2022    Lab Results  Component Value Date   MICROALBUR 150 (H) 03/23/2022   MICROALBUR 10 04/26/2021  -Uncontrolled -Some issues with payment plans at current pharmacy  -NAFLD, persistent TG elevation -Current medications: Sitagliptin '100mg'$  Appropriate, Query effective,  [name] 40  units BID Appropriate, Query effective,  Humalog 8 units before meals  Appropriate, Query effective,  Jardiance 25 mg once daily Query Appropriate,  Patient stopped taking December 2023 -Medications previously tried: stopped metformin after GFR decrease, glipizide (sulfa allergy), GLP (Pancreatitis) -Current home glucose readings June 2023 update: patient was intending to cut down on sweets, doing better with sodas.  Watching bread intake, salt  BG readings (random times) - 162, 153, 247, 184, 150 July 2023: 10/10/21: 117, 125, 125 10/11/21: [id number] 10/12/21: 235, 180 10/13/21: 220, 175, 185, 165 10/14/21: 158, 178, 167 10/15/21: 193, 169 October 2023 Was at [facility] for wife so didn'Rogers have data in front of him -Denies hypoglycemic/hyperglycemic symptoms -Educated on A1c and blood sugar goals; Receives extra help on Rx copays - no current issues  July 2023: Patient over-basalized (80 kg means MDD Long acting is 40 units). Will ask PCP. Metformin was stopped by Dr. Juleen China per patient but I can'Rogers find a contraindication. Patient's GFR  is fine for Metformin. Will ask PCP about low dose since he needs extra therapy to bring him down August 2023: Metformin was trialed twice and patient had severe kidney issues both times per PCP October 2023: Had appt with Endo last month. Missed it and only went last week. Has f/u on 01/08/22 to talk about insulin pump per patient.  Visiting Dietician on 01/16/22 Feb 2024: Patient non-compliant on meds. Will onboard to pharmacy, establish compliance, then go from there  CPP F/U March 2024  [place] Constable, [place].D. - [phone]

## 2022-05-15 NOTE — Patient Outreach (Signed)
Finished onboarding process

## 2022-05-16 ENCOUNTER — Telehealth: Payer: Self-pay | Admitting: Nurse Practitioner

## 2022-05-16 NOTE — Telephone Encounter (Signed)
Contacted [name]. to schedule their annual wellness visit. Appointment made for 05/24/2022.  [name]; Care Guide [facility] Direct Dial: [phone]

## 2022-05-17 ENCOUNTER — Telehealth: Payer: Self-pay

## 2022-05-17 DIAGNOSIS — I10 Essential (primary) hypertension: Secondary | ICD-10-CM

## 2022-05-17 DIAGNOSIS — E1159 Type 2 diabetes mellitus with other circulatory complications: Secondary | ICD-10-CM | POA: Diagnosis not present

## 2022-05-17 NOTE — Progress Notes (Cosign Needed)
Spoke with Dr. De Hollingshead assistant who states that Dr. Ronnald Collum does not prescribe Jardiance but started patient on LYUMJEV Kwikpen.  Ethelene Hal

## 2022-05-21 ENCOUNTER — Ambulatory Visit: Payer: Self-pay | Admitting: *Deleted

## 2022-05-21 NOTE — Telephone Encounter (Signed)
  Chief Complaint: possible wound infection left shoulder per wife on DPR Symptoms: blackened area noted on left shoulder approx size of nickel. Redness surrounding area size of baseball. Patient wife on DPR reports patient "picking at area" .  Frequency: "for a while" Pertinent Negatives: Patient denies fever, no drainage from wound area. Able to use arm .  Disposition: '[]'$ ED /'[x]'$ Urgent Care (no appt availability in office) / '[]'$ Appointment(In office/virtual)/ '[]'$  [facility]/ '[]'$ Home Care/ '[x]'$ Refused Recommended Disposition /'[]'$ [facility]/ '[]'$  Follow-up with PCP Additional Notes:   Recommended to be seen within 24 hours or go to UC. Patient declined. No available appt within 24 hours. Please advise. Patient 's wife would like a call back if medication can be prescribed.     Reason for Disposition  [1] Skin around the wound has become red AND [2] larger than 2 inches (5 cm)  Answer Assessment - Initial Assessment Questions 1. LOCATION: "Where is the wound located?"      Left shoulder  2. WOUND APPEARANCE: "What does the wound look like?"      Blackened area size of nickel in middle and outer area red size of baseball 3. SIZE: If redness is present, ask: "What is the size of the red area?" (Inches, centimeters, or compare to size of a coin)      Baseball  4. SPREAD: "What's changed in the last day?"  "Do you see any red streaks coming from the wound?"     Redness and patient keeps "picking at it" per wife  5. ONSET: "When did it start to look infected?"      "For a while"  6. MECHANISM: "How did the wound start, what was the cause?"     Not sure  7. PAIN: Do you have any pain?"  If Yes, ask: "How bad is the pain?"  (e.g., Scale 1-10; mild, moderate, or severe)    - MILD (1-3): Doesn't interfere with normal activities.     - MODERATE (4-7): Interferes with normal activities or awakens from sleep.    - SEVERE (8-10): Excruciating pain, unable to do any normal activities.        sore 8. FEVER: "Do you have a fever?" If Yes, ask: "What is your temperature, how was it measured, and when did it start?"     denies 9. OTHER SYMPTOMS: "Do you have any other symptoms?" (e.g., shaking chills, weakness, rash elsewhere on body)     no 10. PREGNANCY: "Is there any chance you are pregnant?" "When was your last menstrual period?"       na  Protocols used: Wound Infection Suspected-A-AH

## 2022-05-21 NOTE — Telephone Encounter (Signed)
Pt. Called back. Refuses UC, "I can't  afford to pay for that." Requests to be worked in the office. Please advise pt.

## 2022-05-21 NOTE — Telephone Encounter (Signed)
Called and got patient an appointment for tomorrow 05/22/2022 @ 10:00 am.

## 2022-05-22 ENCOUNTER — Ambulatory Visit (INDEPENDENT_AMBULATORY_CARE_PROVIDER_SITE_OTHER): Payer: Medicare Other | Admitting: Nurse Practitioner

## 2022-05-22 ENCOUNTER — Encounter: Payer: Self-pay | Admitting: Nurse Practitioner

## 2022-05-22 ENCOUNTER — Telehealth: Payer: Self-pay | Admitting: Nurse Practitioner

## 2022-05-22 VITALS — BP 135/75 | HR 70 | Temp 98.0°F | Ht 64.02 in | Wt 172.1 lb

## 2022-05-22 DIAGNOSIS — L03818 Cellulitis of other sites: Secondary | ICD-10-CM | POA: Diagnosis not present

## 2022-05-22 DIAGNOSIS — E1165 Type 2 diabetes mellitus with hyperglycemia: Secondary | ICD-10-CM

## 2022-05-22 DIAGNOSIS — L0390 Cellulitis, unspecified: Secondary | ICD-10-CM | POA: Insufficient documentation

## 2022-05-22 MED ORDER — ONETOUCH ULTRA VI STRP: ORAL_STRIP | 4 refills | Status: DC

## 2022-05-22 MED ORDER — DOXYCYCLINE HYCLATE 100 MG PO TABS: 100.0000 mg | ORAL_TABLET | Freq: Two times a day (BID) | ORAL | 0 refills | Status: DC

## 2022-05-22 MED ORDER — MUPIROCIN 2 % EX OINT: 1.0000 | TOPICAL_OINTMENT | Freq: Two times a day (BID) | CUTANEOUS | 3 refills | Status: DC

## 2022-05-22 NOTE — Telephone Encounter (Signed)
Made in error

## 2022-05-22 NOTE — Progress Notes (Signed)
BP 135/75   Pulse 70   Temp 98 F (36.7 C) (Oral)   Ht 5' 4.02" (1.626 m)   Wt 172 lb 1.6 oz (78.1 kg)   SpO2 98%   BMI 29.53 kg/m    Subjective:    Patient ID: Alex Metro., male    DOB: 1961-01-14, 62 y.o.   MRN: [id number]  HPI: Alex Morimoto. is a 62 y.o. male  Chief Complaint  Patient presents with   swellling    Top of left shoulder for about a week    SHOULDER ISSUES Noticing blackened are to left shoulder, approx nickel size with redness.  Has been present for awhile.  Patient reports it has been there about one week with severe pain. No recent falls or injuries.  Does not recall anything biting him.  He has been picking at area on skin.  Reports his wife had similar and they drained it.   Duration: weeks Involved shoulder: left Mechanism of injury: unknown Location: diffuse Onset:gradual Severity: 10/10  Quality:  sharp, dull, aching, and throbbing Frequency: constant Radiation: no Aggravating factors: lifting and movement  Alleviating factors: nothing  Status: fluctuating Treatments attempted: rest , cream OTC Relief with NSAIDs?:  No NSAIDs Taken Weakness: no Numbness: no Decreased grip strength: no Redness: no Swelling: yes Bruising: no Fevers: no   Relevant past medical, surgical, family and social history reviewed and updated as indicated. Interim medical history since our last visit reviewed. Allergies and medications reviewed and updated.  Review of Systems  Constitutional:  Negative for activity change, diaphoresis, fatigue and fever.  Respiratory:  Negative for cough, chest tightness, shortness of breath and wheezing.   Cardiovascular:  Negative for chest pain, palpitations and leg swelling.  Gastrointestinal: Negative.   Musculoskeletal:  Positive for arthralgias.  Skin:  Positive for wound.  Neurological:  Negative for weakness.  Psychiatric/Behavioral: Negative.      Per HPI unless specifically indicated above      Objective:    BP 135/75   Pulse 70   Temp 98 F (36.7 C) (Oral)   Ht 5' 4.02" (1.626 m)   Wt 172 lb 1.6 oz (78.1 kg)   SpO2 98%   BMI 29.53 kg/m   Wt Readings from Last 3 Encounters:  05/22/22 172 lb 1.6 oz (78.1 kg)  04/17/22 171 lb (77.6 kg)  04/02/22 174 lb 15.7 oz (79.4 kg)    Physical Exam Vitals and nursing note reviewed.  Constitutional:      General: He is awake. He is not in acute distress.    Appearance: He is well-developed. He is obese. He is not ill-appearing.  HENT:     Head: Normocephalic and atraumatic.     Right Ear: Hearing normal. No drainage.     Left Ear: Hearing normal. No drainage.  Eyes:     General: Lids are normal.        Right eye: No discharge.        Left eye: No discharge.     Conjunctiva/sclera: Conjunctivae normal.     Pupils: Pupils are equal, round, and reactive to light.  Neck:     Thyroid: No thyromegaly.     Vascular: Carotid bruit (R>L) present.  Cardiovascular:     Rate and Rhythm: Normal rate and regular rhythm.     Heart sounds: S1 normal and S2 normal. Murmur heard.     Systolic murmur is present with a grade of 2/6.  No gallop.     Comments: Systolic murmur noted best at left sternal border, soft. Pulmonary:     Effort: Pulmonary effort is normal. No accessory muscle usage or respiratory distress.     Breath sounds: Normal breath sounds.  Abdominal:     General: Bowel sounds are normal.     Palpations: Abdomen is soft.  Musculoskeletal:     Cervical back: Normal range of motion and neck supple.     Right lower leg: No edema.     Left lower leg: No edema.  Skin:    General: Skin is warm and dry.     Capillary Refill: Capillary refill takes less than 2 seconds.     Findings: Wound present.       Neurological:     Mental Status: He is alert and oriented to person, place, and time.     Deep Tendon Reflexes: Reflexes are normal and symmetric.  Psychiatric:        Attention and Perception: Attention normal.         Mood and Affect: Mood normal.        Speech: Speech normal.        Behavior: Behavior normal. Behavior is cooperative.    Results for orders placed or performed during the hospital encounter of 04/02/22  Glucose, capillary  Result Value Ref Range   Glucose-Capillary 163 (H) 70 - 99 mg/dL  Glucose, capillary  Result Value Ref Range   Glucose-Capillary 228 (H) 70 - 99 mg/dL  Surgical pathology  Result Value Ref Range   SURGICAL PATHOLOGY      SURGICAL PATHOLOGY CASE: [id number] PATIENT: [name] Surgical Pathology Report     Specimen Submitted: A. Gallbladder  Clinical History: Chronic calculous cholecystitis    DIAGNOSIS: A. GALLBLADDER; CHOLECYSTECTOMY: - CHRONIC CHOLECYSTITIS WITH CHOLELITHIASIS. - NEGATIVE FOR DYSPLASIA AND MALIGNANCY.  GROSS DESCRIPTION: A. Labeled: Gallbladder Received: Fresh Collection time: 10:33 AM on 04/02/2022 Placed into formalin time: 12:33 PM on 04/02/2022 Size of specimen: 6.5 x 2.9 x 1.0 cm Specimen integrity: Disrupted External surface: Yellow and glistening with a roughened hepatic surface and adherent fibrofatty tissue.  There is a 0.2 x 0.2 cm perforation on the hepatic surface Wall thickness: 0.4 cm Mucosa: The mucosa is tan and velvety with focal areas of cholesterol flecking Cystic duct: The cystic duct is 0.7 x 0.7 x 0.2 cm and patent.  No adjacent lymph nodes are identified. Bile present: Yes, red and viscous Stones present: Multiple blac k stones are present within the lumen, 1.0 x 0.3 x 0.2 cm in aggregate Other findings: None noted  Block summary: 1 -cystic duct margin, en face and inked black with representative wall  CM 04/02/2022  Final Diagnosis performed by Allena Napoleon, MD.   Electronically signed 04/03/2022 9:07:45AM The electronic signature indicates that the named Attending Pathologist has evaluated the specimen Technical component performed at [facility], [address], [place], [place] [address] Lab:  [phone] Dir: Rush Farmer, MD, MMM  Professional component performed at [facility], [facility], [place], [place], [place] [address] Lab: [phone] Dir: Kathi Simpers, MD       Assessment & Plan:   Problem List Items Addressed This Visit       Endocrine   Uncontrolled type 2 diabetes mellitus with hyperglycemia ([place]) - Primary    Chronic, ongoing.  A1c 9.9% January 2024 with PCP -- do not have recent endo notes will work on obtaining -- he continues to be non compliant  and reports endo will not place his insulin pump.  Urine ALB 150 January 150.  - Continue Jardiance, Januvia, + insulin as ordered by endo (however he reports they will not place this, which unsure of).  Recommend they reach out to endo to alert them to insulin pump needs.  At this time will place referral to endo at [facility] as feel he needs more complex, tertiary center care due to poor control and ongoing lack of understanding of diabetes -- have provided education often to him. - Poor tolerance to Metformin in past with AKI presenting x 2 trials and allergic to Sulfa.  Would avoid GLP at this time due to patient history of pancreatitis x 3, concern this would flare.  Educated patient at length on effect of diabetes from head to toe and increased risk for recurrent CVA due to poor control.   - Recommend they check his BS TID -- he would benefit from [facility] will work on this with CCM team in future.   - Goal A1C <6.5% due to CVA history -- which reiterated at length with patient today.  Continue collaboration with CCM team.   - Continue collaboration with endo, will obtain notes - Foot and eye exam up to date.      Relevant Orders   Ambulatory referral to Endocrinology     Other   Cellulitis    To left shoulder, posterior -- appears to have presented due to picking at skin in area and causing wound to present.  Would not drain today as does not feel fluid filled, appears more  infected due to irritation from picking at site.  Discussed at length with him and his higher risk with uncontrolled diabetes for worsening infection, he was upset about having to get abx therapy but reports he will start -- he felt if area could be drained he would not need abx, although discussed with him abx would still be advised.  Start Doxycycline 100 MG BID and Mupirocin to area.  Recommend he not pick at skin.  May take Tylenol as needed for pain or use OTC Voltaren gel to shoulder, with exception of wound site.  Monitor temperature at home and alert provider if >100.  Return in one week, sooner if worsening.        Follow up plan: Return in about 1 week (around 05/29/2022) for CELLULITIS.

## 2022-05-22 NOTE — Assessment & Plan Note (Signed)
To left shoulder, posterior -- appears to have presented due to picking at skin in area and causing wound to present.  Would not drain today as does not feel fluid filled, appears more infected due to irritation from picking at site.  Discussed at length with him and his higher risk with uncontrolled diabetes for worsening infection, he was upset about having to get abx therapy but reports he will start -- he felt if area could be drained he would not need abx, although discussed with him abx would still be advised.  Start Doxycycline 100 MG BID and Mupirocin to area.  Recommend he not pick at skin.  May take Tylenol as needed for pain or use OTC Voltaren gel to shoulder, with exception of wound site.  Monitor temperature at home and alert provider if >100.  Return in one week, sooner if worsening.

## 2022-05-22 NOTE — Patient Instructions (Addendum)
Apply heat pack to area and ice as needed to help with discomfort.  I placed a referral to [facility] diabetes doctor -- I need you to go there and see them for your diabetes and getting your insulin pump working.  This is very important.  Cellulitis, Adult  Cellulitis is a skin infection. The infected area is often warm, red, swollen, and sore. It occurs most often on the legs, feet, and toes, but can happen on any part of the body. This condition can be life-threatening without treatment. It is very important to get treated right away. What are the causes? This condition is caused by bacteria. The bacteria enter through a break in the skin, such as: A cut. A burn. A bug bite. An animal bite. An open sore. A crack. What increases the risk? Having a weak body's defense system (immune system). Being older than 60 years old. Having a blood sugar problem (diabetes). Having a long-term liver disease (cirrhosis) or kidney disease. Being very overweight (obese). Having a skin problem, such as: An itchy rash. A rash caused by a fungus. A rash with blisters. Slow movement of blood in the veins (venous stasis). Fluid buildup below the skin (edema). This condition is more likely to occur in people who: Have open cuts, burns, bites, or scrapes on the skin. Have been treated with high-energy rays (radiation). Use IV drugs. What are the signs or symptoms? Skin that: Looks red or purple, or slightly darker than your usual skin color. Has streaks. Has spots. Is swollen. Is sore or painful when you touch it. Is warm. A fever. Chills. Blisters. Tiredness (fatigue). How is this treated? Medicines to treat infections or allergies. Rest. Placing cold or warm cloths on the skin. Staying in the hospital, if the condition is very bad. You may need medicines through an IV. Follow these instructions at home: Medicines Take over-the-counter and prescription medicines only as told by your  doctor. If you were prescribed antibiotics, take them as told by your doctor. Do not stop using them even if you start to feel better. General instructions Drink enough fluid to keep your pee (urine) pale yellow. Do not touch or rub the infected area. Raise (elevate) the infected area above the level of your heart while you are sitting or lying down. Return to your normal activities when your doctor says that it is safe. Place cold or warm cloths on the area as told by your doctor. Keep all follow-up visits. Your doctor will need to make sure that a more serious infection is not developing. Contact a doctor if: You have a fever. You do not start to get better after 1-2 days of treatment. Your bone or joint under the infected area starts to hurt after the skin has healed. Your infection comes back in the same area or another area. Signs of this may include: You have a swollen bump in the area. Your red area gets larger, turns dark in color, or hurts more. You have more fluid coming from the wound. Pus or a bad smell develops in your infected area. You have more pain. You feel sick and have muscle aches and weakness. You develop vomiting or watery poop that will not go away. Get help right away if: You see red streaks coming from the area. You notice the skin turns purple or black and falls off. These symptoms may be an emergency. Get help right away. Call 911. Do not wait to see if the symptoms will go  away. Do not drive yourself to the hospital. This information is not intended to replace advice given to you by your health care provider. Make sure you discuss any questions you have with your health care provider. Document Revised: 10/31/2021 Document Reviewed: 10/31/2021 Elsevier Patient Education  [place].

## 2022-05-22 NOTE — Assessment & Plan Note (Signed)
Chronic, ongoing.  A1c 9.9% January 2024 with PCP -- do not have recent endo notes will work on obtaining -- he continues to be non compliant and reports endo will not place his insulin pump.  Urine ALB 150 January 150.  - Continue Jardiance, Januvia, + insulin as ordered by endo (however he reports they will not place this, which unsure of).  Recommend they reach out to endo to alert them to insulin pump needs.  At this time will place referral to endo at [facility] as feel he needs more complex, tertiary center care due to poor control and ongoing lack of understanding of diabetes -- have provided education often to him. - Poor tolerance to Metformin in past with AKI presenting x 2 trials and allergic to Sulfa.  Would avoid GLP at this time due to patient history of pancreatitis x 3, concern this would flare.  Educated patient at length on effect of diabetes from head to toe and increased risk for recurrent CVA due to poor control.   - Recommend they check his BS TID -- he would benefit from [facility] will work on this with CCM team in future.   - Goal A1C <6.5% due to CVA history -- which reiterated at length with patient today.  Continue collaboration with CCM team.   - Continue collaboration with endo, will obtain notes - Foot and eye exam up to date.

## 2022-05-25 ENCOUNTER — Other Ambulatory Visit: Payer: Self-pay | Admitting: Nurse Practitioner

## 2022-05-25 DIAGNOSIS — R062 Wheezing: Secondary | ICD-10-CM

## 2022-05-25 DIAGNOSIS — R079 Chest pain, unspecified: Secondary | ICD-10-CM

## 2022-05-25 MED ORDER — OMEPRAZOLE 20 MG PO CPDR: 20.0000 mg | DELAYED_RELEASE_CAPSULE | Freq: Every day | ORAL | 4 refills | Status: DC

## 2022-05-25 MED ORDER — ICOSAPENT ETHYL 1 G PO CAPS: 1.0000 g | ORAL_CAPSULE | Freq: Two times a day (BID) | ORAL | 4 refills | Status: DC

## 2022-05-25 MED ORDER — GABAPENTIN 300 MG PO CAPS: 300.0000 mg | ORAL_CAPSULE | Freq: Two times a day (BID) | ORAL | 4 refills | Status: DC

## 2022-05-25 MED ORDER — NITROGLYCERIN 0.4 MG SL SUBL: 0.4000 mg | SUBLINGUAL_TABLET | SUBLINGUAL | 3 refills | Status: DC | PRN

## 2022-05-25 MED ORDER — CLOPIDOGREL BISULFATE 75 MG PO TABS: 75.0000 mg | ORAL_TABLET | Freq: Every day | ORAL | 4 refills | Status: DC

## 2022-05-25 MED ORDER — ROSUVASTATIN CALCIUM 40 MG PO TABS: 40.0000 mg | ORAL_TABLET | Freq: Every day | ORAL | 4 refills | Status: DC

## 2022-05-25 MED ORDER — FLUTICASONE FUROATE-VILANTEROL 100-25 MCG/ACT IN AEPB: 1.0000 | INHALATION_SPRAY | Freq: Every day | RESPIRATORY_TRACT | 4 refills | Status: DC

## 2022-05-25 MED ORDER — EMPAGLIFLOZIN 25 MG PO TABS: 25.0000 mg | ORAL_TABLET | Freq: Every day | ORAL | 4 refills | Status: DC

## 2022-05-25 MED ORDER — FENOFIBRATE 54 MG PO TABS: 54.0000 mg | ORAL_TABLET | Freq: Every day | ORAL | 4 refills | Status: DC

## 2022-05-25 MED ORDER — CHOLECALCIFEROL 1.25 MG (50000 UT) PO TABS: 1.0000 | ORAL_TABLET | ORAL | 4 refills | Status: DC

## 2022-05-25 MED ORDER — SITAGLIPTIN PHOSPHATE 100 MG PO TABS: 100.0000 mg | ORAL_TABLET | Freq: Every day | ORAL | 4 refills | Status: DC

## 2022-05-25 MED ORDER — LISINOPRIL 20 MG PO TABS: 20.0000 mg | ORAL_TABLET | Freq: Every day | ORAL | 4 refills | Status: DC

## 2022-05-25 MED ORDER — MONTELUKAST SODIUM 10 MG PO TABS: 10.0000 mg | ORAL_TABLET | Freq: Every day | ORAL | 4 refills | Status: DC

## 2022-05-26 NOTE — Patient Instructions (Incomplete)
Placed new referral to endocrinology, we need him seen more frequently and you all need assist with pump placement.  Cellulitis, Adult  Cellulitis is a skin infection. The infected area is often warm, red, swollen, and sore. It occurs most often on the legs, feet, and toes, but can happen on any part of the body. This condition can be life-threatening without treatment. It is very important to get treated right away. What are the causes? This condition is caused by bacteria. The bacteria enter through a break in the skin, such as: A cut. A burn. A bug bite. An animal bite. An open sore. A crack. What increases the risk? Having a weak body's defense system (immune system). Being older than 60 years old. Having a blood sugar problem (diabetes). Having a long-term liver disease (cirrhosis) or kidney disease. Being very overweight (obese). Having a skin problem, such as: An itchy rash. A rash caused by a fungus. A rash with blisters. Slow movement of blood in the veins (venous stasis). Fluid buildup below the skin (edema). This condition is more likely to occur in people who: Have open cuts, burns, bites, or scrapes on the skin. Have been treated with high-energy rays (radiation). Use IV drugs. What are the signs or symptoms? Skin that: Looks red or purple, or slightly darker than your usual skin color. Has streaks. Has spots. Is swollen. Is sore or painful when you touch it. Is warm. A fever. Chills. Blisters. Tiredness (fatigue). How is this treated? Medicines to treat infections or allergies. Rest. Placing cold or warm cloths on the skin. Staying in the hospital, if the condition is very bad. You may need medicines through an IV. Follow these instructions at home: Medicines Take over-the-counter and prescription medicines only as told by your doctor. If you were prescribed antibiotics, take them as told by your doctor. Do not stop using them even if you start to feel  better. General instructions Drink enough fluid to keep your pee (urine) pale yellow. Do not touch or rub the infected area. Raise (elevate) the infected area above the level of your heart while you are sitting or lying down. Return to your normal activities when your doctor says that it is safe. Place cold or warm cloths on the area as told by your doctor. Keep all follow-up visits. Your doctor will need to make sure that a more serious infection is not developing. Contact a doctor if: You have a fever. You do not start to get better after 1-2 days of treatment. Your bone or joint under the infected area starts to hurt after the skin has healed. Your infection comes back in the same area or another area. Signs of this may include: You have a swollen bump in the area. Your red area gets larger, turns dark in color, or hurts more. You have more fluid coming from the wound. Pus or a bad smell develops in your infected area. You have more pain. You feel sick and have muscle aches and weakness. You develop vomiting or watery poop that will not go away. Get help right away if: You see red streaks coming from the area. You notice the skin turns purple or black and falls off. These symptoms may be an emergency. Get help right away. Call 911. Do not wait to see if the symptoms will go away. Do not drive yourself to the hospital. This information is not intended to replace advice given to you by your health care provider. Make sure you  discuss any questions you have with your health care provider. Document Revised: 10/31/2021 Document Reviewed: 10/31/2021 Elsevier Patient Education  2023 [employer].

## 2022-05-28 ENCOUNTER — Other Ambulatory Visit: Payer: Self-pay | Admitting: Nurse Practitioner

## 2022-05-28 ENCOUNTER — Ambulatory Visit (INDEPENDENT_AMBULATORY_CARE_PROVIDER_SITE_OTHER): Payer: Medicare Other | Admitting: Gastroenterology

## 2022-05-28 ENCOUNTER — Encounter: Payer: Self-pay | Admitting: Gastroenterology

## 2022-05-28 VITALS — BP 135/72 | HR 61 | Temp 97.8°F | Ht 64.02 in | Wt 174.0 lb

## 2022-05-28 DIAGNOSIS — K760 Fatty (change of) liver, not elsewhere classified: Secondary | ICD-10-CM

## 2022-05-28 MED ORDER — INSULIN DEGLUDEC 100 UNIT/ML ~~LOC~~ SOLN: 40.0000 [IU] | Freq: Two times a day (BID) | SUBCUTANEOUS | 4 refills | Status: DC

## 2022-05-28 MED ORDER — LYUMJEV KWIKPEN 100 UNIT/ML ~~LOC~~ SOPN: PEN_INJECTOR | SUBCUTANEOUS | 4 refills | Status: DC

## 2022-05-28 NOTE — Progress Notes (Unsigned)
Cephas Darby, MD [address]  [place]  [place],  [address]  Main: [phone]  Fax: [phone]    Gastroenterology Consultation  Referring Provider:     [name], NP Primary Care Physician:  [name], NP Primary Gastroenterologist:  Dr. Cephas Darby Reason for Consultation: Elevated LFTs, fatty liver        HPI:   [name]. is a 62 y.o. male referred by  [name], NP  for consultation & management of Cologuard positive.  Patient has history of metabolic syndrome, fatty liver is referred because of Cologuard positive test from 06/09/2020.  Patient is accompanied by his wife today, she does report that patient is not compliant with diabetic diet.  His hemoglobin A1c is 8.6.  And he is not following low-sodium diet.  Patient denies any GI symptoms today.  He has history of reflux for which he takes omeprazole 20 mg daily.  NSAIDs: None  Antiplts/Anticoagulants/Anti thrombotics: Aspirin and Plavix for history of CVA GI Procedures:  Upper endoscopy 12/27/2016 - Normal duodenal bulb and second portion of the duodenum. - Erythematous mucosa in the gastric body. - Multiple gastric polyps. Biopsied. - A small amount of food (residue) in the stomach. - Esophagogastric landmarks identified. - Normal upper third of esophagus, middle third of esophagus, lower third of esophagus and gastroesophageal Junction. DIAGNOSIS:  A. STOMACH; COLD BIOPSY:  - PROTON PUMP INHIBITOR EFFECT.  - NEGATIVE FOR INTESTINAL METAPLASIA, DYSPLASIA, AND MALIGNANCY.  - NEGATIVE FOR HELICOBACTER PYLORI IN HEMATOXYLIN AND EOSIN SECTIONS.   B. STOMACH POLYPS, BODY; COLD BIOPSY:  - FUNDIC GLAND POLYPS, MULTIPLE FRAGMENTS.  - NEGATIVE FOR DYSPLASIA AND MALIGNANCY.   Past Medical History:  Diagnosis Date   Arrhythmia    Asthma    CCC (chronic calculous cholecystitis)    COPD (chronic obstructive pulmonary disease) (HCC)    Cyst of kidney, acquired     Diabetes mellitus without complication ([place]) 0000000   type 2   Diverticulosis    Edentulous    Fatty liver    Gallbladder polyp    GERD (gastroesophageal reflux disease)    Heart murmur    History of chicken pox    History of measles as a child    History of PKU    Hyperlipidemia    Hypertension    IBS (irritable bowel syndrome)    Irregular heart beat    Mentally challenged    Pancreatitis    PONV (postoperative nausea and vomiting)    Stroke ([place]) 03/02/2019   vision issues - right eye/right sided weakness   Vision impairment    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    Past Surgical History:  Procedure Laterality Date   Cardiac Catherization     [place]   CATARACT EXTRACTION W/PHACO Left 06/05/2021   Procedure: CATARACT EXTRACTION PHACO AND INTRAOCULAR LENS PLACEMENT ([place]) LEFT DIABETIC 3.75 00:28.0;  Surgeon: Eulogio Bear, MD;  Location: [facility];  Service: Ophthalmology;  Laterality: Left;  Diabetic   CATARACT EXTRACTION W/PHACO Right 07/10/2021   Procedure: CATARACT EXTRACTION PHACO AND INTRAOCULAR LENS PLACEMENT ([facility]) RIGHT DIABETIC;  Surgeon: Eulogio Bear, MD;  Location: [facility];  Service: Ophthalmology;  Laterality: Right;  Diabetic 2.66 00:24.4   COLONOSCOPY     COLONOSCOPY WITH PROPOFOL N/A 08/10/2020   Procedure: COLONOSCOPY WITH PROPOFOL;  Surgeon: Lin Landsman, MD;  Location: [facility];  Service: Gastroenterology;  Laterality: N/A;  Has ankle monitor; (Parole officer "Ms Andree Moro needs a time before Monday [phone]" - per patient's wife)   ESOPHAGOGASTRODUODENOSCOPY (EGD) WITH PROPOFOL N/A 12/27/2016   Procedure: ESOPHAGOGASTRODUODENOSCOPY (EGD) WITH PROPOFOL;  Surgeon: Lin Landsman, MD;  Location: [facility];  Service: Gastroenterology;  Laterality: N/A;   HEMORRHOID SURGERY     KNEE SURGERY     cyst removed   Ligament Removal Left    of left thumb: dr. Cleda Mccreedy    ligament removal  of left thumb     Dr. Cleda Mccreedy   NM GATED MYOCARDIAL STUDY (ARMX HX)  06/23/2014   [place]. Normal   WISDOM TOOTH EXTRACTION      Current Outpatient Medications:    aspirin 81 MG EC tablet, Take 1 tablet (81 mg total) by mouth daily., Disp: 180 tablet, Rfl: 0   Blood Glucose Monitoring Suppl (ONE TOUCH ULTRA 2) w/Device KIT, Use to check blood sugar 4 times a day, Disp: 1 kit, Rfl: 0   Cholecalciferol 1.25 MG (50000 UT) TABS, Take 1 tablet by mouth once a week., Disp: 12 tablet, Rfl: 4   clopidogrel (PLAVIX) 75 MG tablet, Take 1 tablet (75 mg total) by mouth daily., Disp: 90 tablet, Rfl: 4   doxycycline (VIBRA-TABS) 100 MG tablet, Take 1 tablet (100 mg total) by mouth 2 (two) times daily for 7 days., Disp: 14 tablet, Rfl: 0   empagliflozin (JARDIANCE) 25 MG TABS tablet, Take 1 tablet (25 mg total) by mouth daily., Disp: 90 tablet, Rfl: 4   EPINEPHRINE 0.3 mg/0.3 mL IJ SOAJ injection, INJECT 1 SYRINGE INTO OUTER THIGH ONCE AS NEEDED FOR SEVERE ALLERGIC REACTION. (Patient taking differently: Inject 0.3 mg into the muscle as needed.), Disp: 2 each, Rfl: 1   fenofibrate 54 MG tablet, Take 1 tablet (54 mg total) by mouth daily., Disp: 90 tablet, Rfl: 4   fluticasone furoate-vilanterol (BREO ELLIPTA) 100-25 MCG/ACT AEPB, Inhale 1 puff into the lungs daily., Disp: 60 each, Rfl: 4   gabapentin (NEURONTIN) 300 MG capsule, Take 1 capsule (300 mg total) by mouth 2 (two) times daily., Disp: 180 capsule, Rfl: 4   glucose blood (ONETOUCH ULTRA) test strip, TEST BLOOD SUGAR 5 TIMES A DAY, Disp: 300 each, Rfl: 4   icosapent Ethyl (VASCEPA) 1 g capsule, Take 1 capsule (1 g total) by mouth 2 (two) times daily., Disp: 180 capsule, Rfl: 4   Insulin Degludec (TRESIBA) 100 UNIT/ML SOLN, Inject 40 Units into the skin 2 (two) times daily., Disp: 30 mL, Rfl: 4   Insulin Disposable Pump (OMNIPOD DASH PDM, GEN 4,) KIT, AS PRESCRIBED USE AS DIRECTED BASED ON TOTAL DAILY INSULIN AND POD CHANGE FREQUENCY,  Disp: , Rfl:    Insulin Pen Needle (PEN NEEDLES) 32G X 4 MM MISC, 1 kit by Does not apply route daily., Disp: 100 each, Rfl: 4   Lancets (ONETOUCH DELICA PLUS 123XX123) MISC, USE AS DIRECTED., Disp: 300 each, Rfl: 3   lisinopril (ZESTRIL) 20 MG tablet, Take 1 tablet (20 mg total) by mouth daily., Disp: 90 tablet, Rfl: 4   LYUMJEV KWIKPEN 100 UNIT/ML KwikPen, Per endocrinology directions-SS -- inject into skin 3 times a day prior to meals, Disp: 9 mL, Rfl: 4   Misc. Devices (PULSE OXIMETER FOR FINGER) MISC, To check O2 saturations once daily with asthma and document + check if any shortness of breath, Disp: 1 each, Rfl: 1   montelukast (SINGULAIR) 10 MG tablet, Take 1 tablet (10 mg total) by mouth at  bedtime., Disp: 90 tablet, Rfl: 4   mupirocin ointment (BACTROBAN) 2 %, Apply 1 Application topically 2 (two) times daily., Disp: 22 g, Rfl: 3   nitroGLYCERIN (NITROSTAT) 0.4 MG SL tablet, Place 1 tablet (0.4 mg total) under the tongue every 5 (five) minutes as needed for chest pain., Disp: 50 tablet, Rfl: 3   omeprazole (PRILOSEC) 20 MG capsule, Take 1 capsule (20 mg total) by mouth daily., Disp: 90 capsule, Rfl: 4   rosuvastatin (CRESTOR) 40 MG tablet, Take 1 tablet (40 mg total) by mouth daily., Disp: 90 tablet, Rfl: 4   sitaGLIPtin (JANUVIA) 100 MG tablet, Take 1 tablet (100 mg total) by mouth daily., Disp: 90 tablet, Rfl: 4   tiZANidine (ZANAFLEX) 2 MG tablet, Take 1 tablet (2 mg total) by mouth every 6 (six) hours as needed for muscle spasms. (Patient taking differently: Take 2 mg by mouth every 6 (six) hours.), Disp: 30 tablet, Rfl: 1   triamcinolone cream (KENALOG) 0.1 %, Apply 1 Application topically 2 (two) times daily. (Patient taking differently: Apply 1 Application topically 2 (two) times daily. Arms and legs), Disp: 30 g, Rfl: 1   ULTRACARE PEN NEEDLES 32G X 5 MM MISC, , Disp: , Rfl:    Family History  Problem Relation Age of Onset   Cancer Mother        throat   Diabetes Father     Heart disease Father    Cancer Maternal Grandmother    Cancer Maternal Grandfather        pancreatic   Cancer Paternal Grandfather      Social History   Tobacco Use   Smoking status: Never    Passive exposure: Never   Smokeless tobacco: Never  Vaping Use   Vaping Use: Never used  Substance Use Topics   Alcohol use: No    Alcohol/week: 0.0 standard drinks of alcohol   Drug use: No    Allergies as of 05/28/2022 - Review Complete 05/28/2022  Allergen Reaction Noted   Sulfa antibiotics Shortness Of Breath and Nausea And Vomiting 01/04/2014   Bee venom Hives and Swelling 10/22/2014   Morphine Nausea And Vomiting 03/30/2022   Other  10/09/2014    Review of Systems:    All systems reviewed and negative except where noted in HPI.   Physical Exam:  BP 135/72 (BP Location: Right Arm, Patient Position: Sitting, Cuff Size: Normal)   Pulse 61   Temp 97.8 F (36.6 C) (Oral)   Ht 5' 4.02" (1.626 m)   Wt 174 lb (78.9 kg)   BMI 29.85 kg/m  No LMP for male patient.  General:   Alert,  Well-developed, well-nourished, pleasant and cooperative in NAD Head:  Normocephalic and atraumatic. Eyes:  Sclera clear, no icterus.   Conjunctiva pink. Ears:  Normal auditory acuity. Nose:  No deformity, discharge, or lesions. Mouth:  No deformity or lesions,oropharynx pink & moist. Neck:  Supple; no masses or thyromegaly. Lungs:  Respirations even and unlabored.  Clear throughout to auscultation.   No wheezes, crackles, or rhonchi. No acute distress. Heart:  Regular rate and rhythm; no murmurs, clicks, rubs, or gallops. Abdomen:  Normal bowel sounds. Soft, obese, non-tender and non-distended without masses, hepatosplenomegaly or hernias noted.  No guarding or rebound tenderness.   Rectal: Not performed Msk:  Symmetrical without gross deformities. Good, equal movement & strength bilaterally. Pulses:  Normal pulses noted. Extremities:  No clubbing or edema.  No cyanosis. Neurologic:  Alert and  oriented x3;  grossly normal neurologically.  Skin:  Intact without significant lesions or rashes. No jaundice. Lymph Nodes:  No significant cervical adenopathy. Psych:  Alert and cooperative. Normal mood and affect.  Imaging Studies: Reviewed  Assessment and Plan:   Alex Giamanco. is a 62 y.o. male with history of metabolic syndrome, fatty liver, chronic GERD, fundic land polyps is seen in consultation for Cologuard positive  Cologuard positive Recommend diagnostic colonoscopy Patient is preop clearance and interruption of Plavix for 5 days prior to colonoscopy from his cardiologist Recommend 2-day prep  I have discussed alternative options, risks & benefits,  which include, but are not limited to, bleeding, infection, perforation,respiratory complication & drug reaction.  The patient agrees with this plan & written consent will be obtained.     Fatty liver No evidence of fibrosis based on ultrasound elastography in 2018 Mildly elevated alkaline phosphatase levels Strict control of diabetes, healthy diet and exercise   Follow up as needed   Cephas Darby, MD

## 2022-05-29 ENCOUNTER — Encounter: Payer: Self-pay | Admitting: Gastroenterology

## 2022-05-29 ENCOUNTER — Ambulatory Visit (INDEPENDENT_AMBULATORY_CARE_PROVIDER_SITE_OTHER): Payer: Medicare Other | Admitting: Nurse Practitioner

## 2022-05-29 ENCOUNTER — Encounter: Payer: Self-pay | Admitting: Nurse Practitioner

## 2022-05-29 VITALS — BP 115/77 | HR 54 | Temp 97.6°F | Ht 64.02 in | Wt 169.2 lb

## 2022-05-29 DIAGNOSIS — L03818 Cellulitis of other sites: Secondary | ICD-10-CM

## 2022-05-29 DIAGNOSIS — E1165 Type 2 diabetes mellitus with hyperglycemia: Secondary | ICD-10-CM

## 2022-05-29 MED ORDER — ONETOUCH ULTRA VI STRP: ORAL_STRIP | 4 refills | Status: DC

## 2022-05-29 MED ORDER — DOXYCYCLINE HYCLATE 100 MG PO TABS: 100.0000 mg | ORAL_TABLET | Freq: Two times a day (BID) | ORAL | 0 refills | Status: AC

## 2022-05-29 NOTE — Assessment & Plan Note (Signed)
Chronic, ongoing.  A1c 9.9% January 2024 with PCP -- do not have recent endo notes will work on obtaining -- he continues to be non compliant and reports endo will not place his insulin pump.  Urine ALB 150 January 150.  - Continue Jardiance, Januvia, + insulin as ordered by endo (however he reports they will not place this, which unsure of).  Recommend they reach out to endo to alert them to insulin pump needs.  At this time will place referral to new endo location as feel he needs more complex, due to poor control and ongoing lack of understanding of diabetes/education level -- have provided education often to him. - Poor tolerance to Metformin in past with AKI presenting x 2 trials and allergic to Sulfa.  Would avoid GLP at this time due to patient history of pancreatitis x 3, concern this would flare.  Educated patient at length on effect of diabetes from head to toe and increased risk for recurrent CVA due to poor control.   - Recommend they check his BS TID -- he would benefit from [facility] will work on this with CCM team in future.   - Goal A1C <6.5% due to CVA history -- which reiterated at length with patient today.  Continue collaboration with CCM team.   - Continue collaboration with endo, will obtain notes - Foot and eye exam up to date.

## 2022-05-29 NOTE — Progress Notes (Signed)
BP 115/77   Pulse (!) 54   Temp 97.6 F (36.4 C) (Oral)   Ht 5' 4.02" (1.626 m)   Wt 169 lb 3.2 oz (76.7 kg)   SpO2 96%   BMI 29.03 kg/m    Subjective:    Patient ID: Alex Metro., male    DOB: 05-01-1960, 62 y.o.   MRN: [id number]  HPI: Alex Hudzinski. is a 62 y.o. male  Chief Complaint  Patient presents with   Cellulitis   SHOULDER CELLULITIS Follow-up today for cellulitis to left shoulder, initially presented due to patient picking at skin on shoulder.  Treated with Doxycycline and Mupirocin, has some left he continues to take.  He reports overall shoulder is improving and can move better.  Noticing blackened area to left shoulder, approx nickel size with redness on 05/21/32. States he has not been scratching at area.  No recent falls or injuries.  Does not recall anything biting him.    Placed new referral to alternate endocrinologist last visit, but he can not go to [facility] due to ankle monitor. Duration: weeks Involved shoulder: left Mechanism of injury: unknown Location: diffuse Onset:gradual Severity:  0/10 Quality:  none Frequency: constant Radiation: no Aggravating factors: lifting and movement  Alleviating factors: nothing  Status: improving Treatments attempted: rest , cream OTC Relief with NSAIDs?:  No NSAIDs Taken Weakness: no Numbness: no Decreased grip strength: no Redness: no Swelling: yes Bruising: no Fevers: no   Relevant past medical, surgical, family and social history reviewed and updated as indicated. Interim medical history since our last visit reviewed. Allergies and medications reviewed and updated.  Review of Systems  Constitutional:  Negative for activity change, diaphoresis, fatigue and fever.  Respiratory:  Negative for cough, chest tightness, shortness of breath and wheezing.   Cardiovascular:  Negative for chest pain, palpitations and leg swelling.  Gastrointestinal: Negative.   Musculoskeletal:  Negative for  arthralgias.  Skin:  Positive for wound.  Neurological:  Negative for weakness.  Psychiatric/Behavioral: Negative.      Per HPI unless specifically indicated above     Objective:    BP 115/77   Pulse (!) 54   Temp 97.6 F (36.4 C) (Oral)   Ht 5' 4.02" (1.626 m)   Wt 169 lb 3.2 oz (76.7 kg)   SpO2 96%   BMI 29.03 kg/m   Wt Readings from Last 3 Encounters:  05/29/22 169 lb 3.2 oz (76.7 kg)  05/28/22 174 lb (78.9 kg)  05/22/22 172 lb 1.6 oz (78.1 kg)    Physical Exam Vitals and nursing note reviewed.  Constitutional:      General: He is awake. He is not in acute distress.    Appearance: He is well-developed. He is obese. He is not ill-appearing.  HENT:     Head: Normocephalic and atraumatic.     Right Ear: Hearing normal. No drainage.     Left Ear: Hearing normal. No drainage.  Eyes:     General: Lids are normal.        Right eye: No discharge.        Left eye: No discharge.     Conjunctiva/sclera: Conjunctivae normal.     Pupils: Pupils are equal, round, and reactive to light.  Neck:     Thyroid: No thyromegaly.     Vascular: Carotid bruit (R>L) present.  Cardiovascular:     Rate and Rhythm: Normal rate and regular rhythm.     Heart sounds: S1 normal and  S2 normal. Murmur heard.     Systolic murmur is present with a grade of 2/6.     No gallop.     Comments: Systolic murmur noted best at left sternal border, soft. Pulmonary:     Effort: Pulmonary effort is normal. No accessory muscle usage or respiratory distress.     Breath sounds: Normal breath sounds.  Abdominal:     General: Bowel sounds are normal.     Palpations: Abdomen is soft.  Musculoskeletal:     Cervical back: Normal range of motion and neck supple.     Right lower leg: No edema.     Left lower leg: No edema.  Skin:    General: Skin is warm and dry.     Capillary Refill: Capillary refill takes less than 2 seconds.     Findings: Wound present.       Neurological:     Mental Status: He is  alert and oriented to person, place, and time.     Deep Tendon Reflexes: Reflexes are normal and symmetric.  Psychiatric:        Attention and Perception: Attention normal.        Mood and Affect: Mood normal.        Speech: Speech normal.        Behavior: Behavior normal. Behavior is cooperative.    Results for orders placed or performed during the hospital encounter of 04/02/22  Glucose, capillary  Result Value Ref Range   Glucose-Capillary 163 (H) 70 - 99 mg/dL  Glucose, capillary  Result Value Ref Range   Glucose-Capillary 228 (H) 70 - 99 mg/dL  Surgical pathology  Result Value Ref Range   SURGICAL PATHOLOGY      SURGICAL PATHOLOGY CASE: [id number] PATIENT: [name] Surgical Pathology Report     Specimen Submitted: A. Gallbladder  Clinical History: Chronic calculous cholecystitis    DIAGNOSIS: A. GALLBLADDER; CHOLECYSTECTOMY: - CHRONIC CHOLECYSTITIS WITH CHOLELITHIASIS. - NEGATIVE FOR DYSPLASIA AND MALIGNANCY.  GROSS DESCRIPTION: A. Labeled: Gallbladder Received: Fresh Collection time: 10:33 AM on 04/02/2022 Placed into formalin time: 12:33 PM on 04/02/2022 Size of specimen: 6.5 x 2.9 x 1.0 cm Specimen integrity: Disrupted External surface: Yellow and glistening with a roughened hepatic surface and adherent fibrofatty tissue.  There is a 0.2 x 0.2 cm perforation on the hepatic surface Wall thickness: 0.4 cm Mucosa: The mucosa is tan and velvety with focal areas of cholesterol flecking Cystic duct: The cystic duct is 0.7 x 0.7 x 0.2 cm and patent.  No adjacent lymph nodes are identified. Bile present: Yes, red and viscous Stones present: Multiple blac k stones are present within the lumen, 1.0 x 0.3 x 0.2 cm in aggregate Other findings: None noted  Block summary: 1 -cystic duct margin, en face and inked black with representative wall  CM 04/02/2022  Final Diagnosis performed by Allena Napoleon, MD.   Electronically signed 04/03/2022  9:07:45AM The electronic signature indicates that the named Attending Pathologist has evaluated the specimen Technical component performed at [facility], [address], [place], [place] [address] Lab: [phone] Dir: Rush Farmer, MD, MMM  Professional component performed at [facility], [facility], [place], [place], [place] [address] Lab: [phone] Dir: Kathi Simpers, MD       Assessment & Plan:   Problem List Items Addressed This Visit       Endocrine   Uncontrolled type 2 diabetes mellitus with hyperglycemia ([place])    Chronic, ongoing.  A1c 9.9% January 2024 with  PCP -- do not have recent endo notes will work on obtaining -- he continues to be non compliant and reports endo will not place his insulin pump.  Urine ALB 150 January 150.  - Continue Jardiance, Januvia, + insulin as ordered by endo (however he reports they will not place this, which unsure of).  Recommend they reach out to endo to alert them to insulin pump needs.  At this time will place referral to new endo location as feel he needs more complex, due to poor control and ongoing lack of understanding of diabetes/education level -- have provided education often to him. - Poor tolerance to Metformin in past with AKI presenting x 2 trials and allergic to Sulfa.  Would avoid GLP at this time due to patient history of pancreatitis x 3, concern this would flare.  Educated patient at length on effect of diabetes from head to toe and increased risk for recurrent CVA due to poor control.   - Recommend they check his BS TID -- he would benefit from [facility] will work on this with CCM team in future.   - Goal A1C <6.5% due to CVA history -- which reiterated at length with patient today.  Continue collaboration with CCM team.   - Continue collaboration with endo, will obtain notes - Foot and eye exam up to date.      Relevant Orders   Ambulatory referral to Endocrinology     Other   Cellulitis -  Primary    To left shoulder, posterior -- appears to have presented due to picking at skin in area.  Overall wound is improving and has drained on own.  Extend Doxycycline 100 MG BID for 7 more days and continue Mupirocin to area.  Recommend he not pick at skin.  May take Tylenol as needed for pain or use OTC Voltaren gel to shoulder, with exception of wound site.  Monitor temperature at home and alert provider if >100.  Return in one week, sooner if worsening.        Follow up plan: Return in about 1 week (around 06/05/2022) for Cellulitis recheck.

## 2022-05-29 NOTE — Assessment & Plan Note (Signed)
To left shoulder, posterior -- appears to have presented due to picking at skin in area.  Overall wound is improving and has drained on own.  Extend Doxycycline 100 MG BID for 7 more days and continue Mupirocin to area.  Recommend he not pick at skin.  May take Tylenol as needed for pain or use OTC Voltaren gel to shoulder, with exception of wound site.  Monitor temperature at home and alert provider if >100.  Return in one week, sooner if worsening.

## 2022-05-31 ENCOUNTER — Telehealth: Payer: Self-pay

## 2022-05-31 ENCOUNTER — Telehealth: Payer: Self-pay | Admitting: Nurse Practitioner

## 2022-05-31 DIAGNOSIS — Z599 Problem related to housing and economic circumstances, unspecified: Secondary | ICD-10-CM

## 2022-05-31 NOTE — Telephone Encounter (Signed)
Spoke to [facility] and wife [name] on phone as saw patient declined appointment to [facility] endocrinology -- [name] reports patient can not leave county due to ankle monitor and concern as do not have a good vehicle to travel that far and cost for gas.  Discussed at length with [name] the importance of [name] seeing an endocrinologist who can assist with insulin pump as his diabetes is uncontrolled and puts a risk for another CVA.  She would like [name] to reach out to [place] to see if they will see [name].  He needs endo ASAP who can assist with insulin pump as they report Dr. Ronnald Rogers recommended this but can not apply it.  Can not go to [place] without permission + poor transportation and low income, concern for cost of visits.  Discussed with her will work on trying to get [place] into [place] due to him not being able to leave county + will place referral for social worker to assist with financial strain and transportation needs.  She stated appreciation for this.

## 2022-05-31 NOTE — Telephone Encounter (Signed)
   Telephone encounter was:  Unsuccessful.  05/31/2022 Name: [name]. MRN: [id number] DOB: 08-31-1960  Unsuccessful outbound call made today to assist with:  Transportation Needs   Outreach Attempt:  1st Attempt  A HIPAA compliant voice message was left requesting a return call.  Instructed patient to call back at [phone]. Left message for patient to return my call regarding transportation. Home number is not in service unable to leave message.  [place] Resource Care Guide   [email or web address]  ?? [phone]   Website: [email or web address]  [email or web address]

## 2022-06-04 ENCOUNTER — Telehealth: Payer: Self-pay

## 2022-06-04 NOTE — Telephone Encounter (Signed)
Unable to leave message - will try again later.

## 2022-06-04 NOTE — Telephone Encounter (Signed)
   Telephone encounter was:  Successful.  06/04/2022 Name: [name]. MRN: [id number] DOB: August 24, 1960  [name]. is a 62 y.o. year old male who is a primary care patient of Cannady, Barbaraann Faster, NP . The community resource team was consulted for assistance with Transportation Needs  and Financial Difficulties related to utilities, medical bills.  Care guide performed the following interventions: Spoke with patient's spouse [name] did a three way call with Sharyn Lull at [facility] will make a home visit either today or tomorrow.  After he makes his visit the patient's transportation can be setup. [name] has Michelle's number is there are any questions.  Also, gave the number for [place] application.  Follow Up Plan:  No further follow up planned at this time. The patient has been provided with needed resources.  [place] Resource Care Guide   [email or web address]  ?? [phone]   Website: [email or web address]  [email or web address]

## 2022-06-04 NOTE — Telephone Encounter (Signed)
Patient's Wife [name] states that [name] does not want the insulin pump since he will not be able to take care of it or use it.

## 2022-06-05 ENCOUNTER — Other Ambulatory Visit: Payer: Self-pay | Admitting: Nephrology

## 2022-06-05 DIAGNOSIS — E1122 Type 2 diabetes mellitus with diabetic chronic kidney disease: Secondary | ICD-10-CM

## 2022-06-06 ENCOUNTER — Ambulatory Visit: Payer: Medicare Other | Admitting: Nurse Practitioner

## 2022-06-08 ENCOUNTER — Ambulatory Visit
Admission: RE | Admit: 2022-06-08 | Discharge: 2022-06-08 | Disposition: A | Discharge status: Home / Self Care | Payer: Medicare Other | Source: Ambulatory Visit | Attending: Nephrology | Admitting: Nephrology

## 2022-06-08 DIAGNOSIS — E1122 Type 2 diabetes mellitus with diabetic chronic kidney disease: Secondary | ICD-10-CM | POA: Insufficient documentation

## 2022-06-08 DIAGNOSIS — N182 Chronic kidney disease, stage 2 (mild): Secondary | ICD-10-CM | POA: Insufficient documentation

## 2022-06-13 NOTE — Patient Outreach (Signed)
Care Management & Coordination Services Pharmacy Note  06/13/2022 Name:  [name]. MRN:  [id number] DOB:  Nov 02, 1960  Summary: -Very pleasant patient and his wife ([name]) who manages all his healthcare. She loves animals and even saved an animal while visiting the zoo one time! -[name], patient's wife, her aunt [name] is in [facility] end of March and patient is worried about her.  Recommendations/Changes made from today's visit: -Patient experienced hypoglycemia once. State he didn't eat properly. Will let Endo know  Subjective: [name]. is an 62 y.o. year old male who is a primary patient of [name], Alex Faster, NP.  The care coordination team was consulted for assistance with disease management and care coordination needs.    Engaged with patient by telephone for follow up visit.   Objective:  Lab Results  Component Value Date   CREATININE 0.92 03/23/2022   BUN 12 03/23/2022   EGFR 96 03/23/2022   GFRNONAA 66 02/24/2020   GFRAA 76 02/24/2020   NA 137 03/23/2022   K 4.5 03/23/2022   CALCIUM 9.4 03/23/2022   CO2 21 03/23/2022   GLUCOSE 307 (H) 03/23/2022    Lab Results  Component Value Date/Time   HGBA1C 9.9 (H) 03/23/2022 10:08 AM   HGBA1C 10.0 (H) 10/27/2021 04:03 PM   MICROALBUR 150 (H) 03/23/2022 10:08 AM   MICROALBUR 10 04/26/2021 02:22 PM   MICROALBUR 20 08/22/2015 05:04 PM    Last diabetic Eye exam:  Lab Results  Component Value Date/Time   HMDIABEYEEXA No Retinopathy 05/25/2021 12:00 AM    Last diabetic Foot exam: No results found for: "HMDIABFOOTEX"   Lab Results  Component Value Date   CHOL 193 03/28/2022   HDL 26 (L) 03/28/2022   LDLCALC 90 03/28/2022   TRIG 469 (H) 03/28/2022   CHOLHDL 5.3 03/03/2019       Latest Ref Rng & Units 03/23/2022   10:10 AM 10/27/2021    4:06 PM 04/26/2021    2:25 PM  Hepatic Function  Total Protein 6.0 - 8.5 g/dL 7.2  6.6  7.1   Albumin 3.8 - 4.9 g/dL 4.7  4.3  4.8   AST 0 - 40 IU/L  32  24  24   ALT 0 - 44 IU/L 46  26  36   Alk Phosphatase 44 - 121 IU/L 186  152  148   Total Bilirubin 0.0 - 1.2 mg/dL 0.3  0.3  0.5     Lab Results  Component Value Date/Time   TSH 1.070 03/23/2022 10:10 AM   TSH 1.130 04/26/2021 02:25 PM       Latest Ref Rng & Units 03/23/2022   10:10 AM 04/26/2021    2:25 PM 01/18/2021    2:31 PM  CBC  WBC 3.4 - 10.8 x10E3/uL 5.1  4.8  5.0   Hemoglobin 13.0 - 17.7 g/dL 15.9  14.6  13.4   Hematocrit 37.5 - 51.0 % 48.9  43.9  42.6   Platelets 150 - 450 x10E3/uL 198  174  176     Lab Results  Component Value Date/Time   VD25OH 16.1 (L) 03/23/2022 10:10 AM   VD25OH 14.4 (L) 01/30/2022 03:33 PM   VITAMINB12 312 01/30/2022 03:33 PM   VITAMINB12 251 04/26/2021 02:25 PM    Clinical ASCVD: Yes  The ASCVD Risk score (Arnett DK, et al., 2019) failed to calculate for the following reasons:   The patient has a prior MI or stroke diagnosis  Other: (CHADS2VASc if Afib, MMRC or CAT for COPD, ACT, DEXA)     12/25/2021    5:05 PM 08/01/2021    1:23 PM 07/26/2021    3:16 PM  Depression screen PHQ 2/9  Decreased Interest 0 3 0  Down, Depressed, Hopeless 0 0 0  PHQ - 2 Score 0 3 0  Altered sleeping  0 0  Tired, decreased energy  3 3  Change in appetite  0 0  Feeling bad or failure about yourself   0 0  Trouble concentrating  1 1  Moving slowly or fidgety/restless  1 3  Suicidal thoughts  0 0  PHQ-9 Score  8 7     Social History   Tobacco Use  Smoking Status Never   Passive exposure: Never  Smokeless Tobacco Never   BP Readings from Last 3 Encounters:  05/29/22 115/77  05/28/22 135/72  05/22/22 135/75   Pulse Readings from Last 3 Encounters:  05/29/22 (!) 54  05/28/22 61  05/22/22 70   Wt Readings from Last 3 Encounters:  05/29/22 169 lb 3.2 oz (76.7 kg)  05/28/22 174 lb (78.9 kg)  05/22/22 172 lb 1.6 oz (78.1 kg)   BMI Readings from Last 3 Encounters:  05/29/22 29.03 kg/m  05/28/22 29.85 kg/m  05/22/22 29.53 kg/m     Allergies  Allergen Reactions   Sulfa Antibiotics Shortness Of Breath and Nausea And Vomiting   Bee Venom Hives and Swelling    All kinds of bees   Morphine Nausea And Vomiting   Other     Certain powders    Medications Reviewed Today     Reviewed by [name], NP (Nurse Practitioner) on 05/29/22 at 1152  Med List Status: <None>   Medication Order Taking? Sig Documenting Provider Last Dose Status Informant  aspirin 81 MG EC tablet [id number] Yes Take 1 tablet (81 mg total) by mouth daily. Marnee Guarneri T, NP Taking Active   Blood Glucose Monitoring Suppl (ONE TOUCH ULTRA 2) w/Device KIT [id number] Yes Use to check blood sugar 4 times a day [name], Jolene T, NP Taking Active   Cholecalciferol 1.25 MG (50000 UT) TABS [id number] Yes Take 1 tablet by mouth once a week. Marnee Guarneri T, NP Taking Active   clopidogrel (PLAVIX) 75 MG tablet [id number] Yes Take 1 tablet (75 mg total) by mouth daily. Marnee Guarneri T, NP Taking Active   doxycycline (VIBRA-TABS) 100 MG tablet [id number]  Take 1 tablet (100 mg total) by mouth 2 (two) times daily for 7 days. Marnee Guarneri T, NP  Active   empagliflozin (JARDIANCE) 25 MG TABS tablet [id number] Yes Take 1 tablet (25 mg total) by mouth daily. Marnee Guarneri T, NP Taking Active   EPINEPHRINE 0.3 mg/0.3 mL IJ SOAJ injection [id number] Yes INJECT 1 SYRINGE INTO OUTER THIGH ONCE AS NEEDED FOR SEVERE ALLERGIC REACTION.  Patient taking differently: Inject 0.3 mg into the muscle as needed.   Marnee Guarneri T, NP Taking Active   fenofibrate 54 MG tablet [id number] Yes Take 1 tablet (54 mg total) by mouth daily. Marnee Guarneri T, NP Taking Active   fluticasone furoate-vilanterol (BREO ELLIPTA) 100-25 MCG/ACT AEPB [id number] Yes Inhale 1 puff into the lungs daily. Marnee Guarneri T, NP Taking Active   gabapentin (NEURONTIN) 300 MG capsule [id number] Yes Take 1 capsule (300 mg total) by mouth 2 (two) times daily. Marnee Guarneri T, NP Taking Active    glucose blood (ONETOUCH ULTRA) test strip [id number]  TEST BLOOD SUGAR 5 TIMES  A DAY [name], Alex Faster, NP  Active   icosapent Ethyl (VASCEPA) 1 g capsule [id number] Yes Take 1 capsule (1 g total) by mouth 2 (two) times daily. Marnee Guarneri T, NP Taking Active   Insulin Degludec (TRESIBA) 100 UNIT/ML SOLN [id number] Yes Inject 40 Units into the skin 2 (two) times daily. Marnee Guarneri T, NP Taking Active   Insulin Disposable Pump (OMNIPOD DASH PDM, GEN 4,) KIT [id number] Yes AS PRESCRIBED USE AS DIRECTED BASED ON TOTAL DAILY INSULIN AND POD CHANGE FREQUENCY [provider] Taking Active   Insulin Pen Needle (PEN NEEDLES) 32G X 4 MM MISC [id number] Yes 1 kit by Does not apply route daily. [name], NP Taking Active   Lancets [facility] DELICA PLUS [id number]) MISC [id number] Yes USE AS DIRECTED. Marnee Guarneri T, NP Taking Active   lisinopril (ZESTRIL) 20 MG tablet [id number] Yes Take 1 tablet (20 mg total) by mouth daily. Marnee Guarneri T, NP Taking Active   LYUMJEV KWIKPEN 100 UNIT/ML KwikPen [id number] Yes Per endocrinology directions-SS -- inject into skin 3 times a day prior to meals [name], Alex Faster, NP Taking Active   Misc. Devices (PULSE OXIMETER FOR FINGER) MISC [id number] Yes To check O2 saturations once daily with asthma and document + check if any shortness of breath [name], Jolene T, NP Taking Active   montelukast (SINGULAIR) 10 MG tablet [id number] Yes Take 1 tablet (10 mg total) by mouth at bedtime. Marnee Guarneri T, NP Taking Active   mupirocin ointment (BACTROBAN) 2 % 0000000 Yes Apply 1 Application topically 2 (two) times daily. Marnee Guarneri T, NP Taking Active   nitroGLYCERIN (NITROSTAT) 0.4 MG SL tablet [id number] Yes Place 1 tablet (0.4 mg total) under the tongue every 5 (five) minutes as needed for chest pain. Marnee Guarneri T, NP Taking Active   omeprazole (PRILOSEC) 20 MG capsule [id number] Yes Take 1 capsule (20 mg total) by mouth daily. Marnee Guarneri T, NP  Taking Active   rosuvastatin (CRESTOR) 40 MG tablet [id number] Yes Take 1 tablet (40 mg total) by mouth daily. Marnee Guarneri T, NP Taking Active   sitaGLIPtin (JANUVIA) 100 MG tablet [id number] Yes Take 1 tablet (100 mg total) by mouth daily. Marnee Guarneri T, NP Taking Active   tiZANidine (ZANAFLEX) 2 MG tablet [id number] Yes Take 1 tablet (2 mg total) by mouth every 6 (six) hours as needed for muscle spasms.  Patient taking differently: Take 2 mg by mouth every 6 (six) hours.   Marnee Guarneri T, NP Taking Active   triamcinolone cream (KENALOG) 0.1 % [id number] Yes Apply 1 Application topically 2 (two) times daily.  Patient taking differently: Apply 1 Application topically 2 (two) times daily. Arms and legs   [name], Alex Faster, NP Taking Active   ULTRACARE PEN NEEDLES 32G X 5 MM MISC [id number] Yes  [provider] Taking Active             SDOH:  (Social Determinants of Health) assessments and interventions performed: Yes SDOH Interventions    [place] Coordination from 06/18/2022 in [place] Most recent reading at 06/13/2022  3:01 PM Telephone from 06/04/2022 in [place] Most recent reading at 06/04/2022 11:12 AM Care Coordination from 05/14/2022 in [place] Most recent reading at 05/14/2022  1:39 PM Chronic Care Management from 02/07/2022 in [place] Most recent reading at 02/07/2022  9:20 AM Care Coordination from 12/25/2021 in [place] Most recent reading  at 12/25/2021  5:07 PM Chronic Care Management from 12/25/2021 in [place] Most recent reading at 12/25/2021 12:11 PM  SDOH Interventions        Food Insecurity Interventions -- -- -- Other (Comment)  [uses local food banks] Other (Comment)  [currenlty has resources for area food banks] --  Housing Interventions -- -- -- Intervention Not Indicated  Intervention Not Indicated --  Transportation Interventions Intervention Not Indicated Other (Comment)  [Referral to [employer] transportation] -- Intervention Not Indicated Intervention Not Indicated Intervention Not Indicated  Utilities Interventions -- -- -- Intervention Not Indicated Intervention Not Indicated --  Alcohol Usage Interventions -- -- -- Intervention Not Indicated (Score <7) -- --  Financial Strain Interventions -- Other (Comment)  [facility] application, Gave information for low income energy assistance program.] Other (Comment)  [Refer to Pam] Other (Comment)  [open to resources to help with bills and needs met] Other (Comment)  [plan to apply for charity care for hospital bills] Other (Comment)  [Coordinate with SW]  Physical Activity Interventions Patient Refused -- Patient Refused Other (Comments)  [encouraged activity, does not do structured activity] -- --  Stress Interventions -- -- -- Other (Comment)  [has multiple chronic conditions that impact his care] -- --  Social Connections Interventions -- -- -- Intervention Not Indicated, Other (Comment)  [has good family support system] -- --       Medication Assistance: None required.  Patient affirms current coverage meets needs.    [facility] Reviewed: Is patient disadvantaged to use [facility]?: No  Name and location of Current pharmacy:  [facility] [place], [address]. [place] [address] Phone: [phone] Fax: [phone]  [facility] services reviewed with patient today?: Yes  Patient requests to transfer care to [facility]?: Yes  -Verbal consent obtained for [facility] enhanced pharmacy services (medication synchronization, adherence packaging, delivery coordination). A medication sync plan was created to allow patient to get all medications delivered once every 30 to 90 days per patient preference. Patient understands they  have freedom to choose pharmacy and clinical pharmacist will coordinate care between all prescribers and [facility].   Assessment/Plan   Hypertension (BP goal <130/80) BP Readings from Last 3 Encounters:  05/29/22 115/77  05/28/22 135/72  05/22/22 135/75   -Uncontrolled -Current treatment: Lisinopril 20mg  Appropriate, Query effective,  -Medications previously tried: N/A  -Current home readings:  July 2023: 10/15/21: 118/68 10/14/21: 165/65 120/75 155/75 135/65 October 2023 Didn't have readings -Current dietary habits: "Tries to eat healthy" -Current exercise habits: none -Denies hypotensive/hypertensive symptoms -Educated on BP goals and benefits of medications for prevention of heart attack, stroke and kidney damage; -Counseled to monitor BP at home daily, document, and provide log at future appointments July 2023: BP high, let PCP know October 2023: Was at [facility] for wife so didn't have data in front of him Feb 2024: Didn't have BP readings in front of them March 2024: Didn't have BP readings   CAD: (LDL goal < 70) The ASCVD Risk score (Arnett DK, et al., 2019) failed to calculate for the following reasons:   The patient has a prior MI or stroke diagnosis Lab Results  Component Value Date   CHOL 193 03/28/2022   CHOL 240 (H) 03/23/2022   CHOL 277 (H) 01/30/2022   Lab Results  Component Value Date   HDL 26 (L) 03/28/2022   HDL 30 (L) 03/23/2022   HDL  20 (L) 01/30/2022   Lab Results  Component Value Date   LDLCALC 90 03/28/2022   LDLCALC 97 03/23/2022   LDLCALC Comment (A) 01/30/2022   Lab Results  Component Value Date   TRIG 469 (H) 03/28/2022   TRIG 665 ([place]) 03/23/2022   TRIG 1,384 ([place]) 01/30/2022   Lab Results  Component Value Date   CHOLHDL 5.3 03/03/2019   CHOLHDL 3.9 08/04/2018   CHOLHDL 4.2 05/22/2017   No results found for: "LDLDIRECT" Last vitamin D Lab Results  Component Value Date   VD25OH 16.1 (L) 03/23/2022   Lab Results   Component Value Date   TSH 1.070 03/23/2022  -Uncontrolled -CVA Hx noted - high risk. LDL goal at least <70 -Current treatment: Fenofibrate 54 mg Appropriate, Query effective,  Atorvastatin 80 mg Appropriate, Query effective,  Clopidogrel 75mg  Appropriate, Effective, Safe, Accessible ASA 81mg  Appropriate, Effective, Safe, Accessible Vascepa Appropriate, Query effective,  -reviewed for intolerance/side effects - no problems noted    -Educated on Cholesterol goals;  Benefits of statin for ASCVD risk reduction; -Counseled on diet and exercise extensively July 2023: Recommend Ezetimibe for LDL.    Diabetes (A1c goal <8%) Lab Results  Component Value Date   HGBA1C 9.9 (H) 03/23/2022   HGBA1C 10.0 (H) 10/27/2021   HGBA1C 8.8 (H) 07/26/2021   Lab Results  Component Value Date   MICROALBUR 150 (H) 03/23/2022   LDLCALC 90 03/28/2022   CREATININE 0.92 03/23/2022    Lab Results  Component Value Date   NA 137 03/23/2022   K 4.5 03/23/2022   CREATININE 0.92 03/23/2022   EGFR 96 03/23/2022   GFRNONAA 66 02/24/2020   GLUCOSE 307 (H) 03/23/2022    Lab Results  Component Value Date   WBC 5.1 03/23/2022   HGB 15.9 03/23/2022   HCT 48.9 03/23/2022   MCV 94 03/23/2022   PLT 198 03/23/2022    Lab Results  Component Value Date   MICROALBUR 150 (H) 03/23/2022   MICROALBUR 10 04/26/2021  -Uncontrolled -Some issues with payment plans at current pharmacy  -NAFLD, persistent TG elevation -Current medications: Sitagliptin 100mg  Appropriate, Query effective,  Tresiba 40 units BID Appropriate, Query effective,  Humalog 8 units before meals  Appropriate, Query effective,  Jardiance 25 mg once daily Query Appropriate,  Patient stopped taking December 2023 -Medications previously tried: stopped metformin after GFR decrease, glipizide (sulfa allergy), GLP (Pancreatitis) -Current home glucose readings June 2023 update: patient was intending to cut down on sweets, doing better with  sodas.  Watching bread intake, salt  BG readings (random times) - 162, 153, 247, 184, 150 July 2023: 10/10/21: 117, 125, 125 10/11/21: [id number] 10/12/21: 235, 180 10/13/21: 220, 175, 185, 165 10/14/21: 158, 178, 167 10/15/21: 193, 169 October 2023 Was at [facility] for wife so didn't have data in front of him March 2024: -06/12/22: 60 (Skipped meal) -06/13/22: 85 -Denies hypoglycemic/hyperglycemic symptoms -Educated on A1c and blood sugar goals; Receives extra help on Rx copays - no current issues  July 2023: Patient over-basalized (80 kg means MDD Long acting is 40 units). Will ask PCP. Metformin was stopped by Dr. Juleen China per patient but I can't find a contraindication. Patient's GFR  is fine for Metformin. Will ask PCP about low dose since he needs extra therapy to bring him down August 2023: Metformin was trialed twice and patient had severe kidney issues both times per PCP October 2023: Had appt with Endo last month. Missed it and only went last week. Has f/u on 01/08/22 to  talk about insulin pump per patient. Visiting Dietician on 01/16/22 Feb 2024: Patient non-compliant on meds. Will onboard to pharmacy, establish compliance, then go from there March 2024: Patient stated he is eating significantly better. Because he skipped a meal, his sugars dropped. Counseled to not skip meals and take meds. Will let Endo know  CPP F/U May 2024  [place] Constable, [place].D. - [phone]

## 2022-06-14 ENCOUNTER — Ambulatory Visit (INDEPENDENT_AMBULATORY_CARE_PROVIDER_SITE_OTHER): Payer: Medicare Other

## 2022-06-14 ENCOUNTER — Telehealth: Payer: Medicare Other

## 2022-06-14 DIAGNOSIS — E1165 Type 2 diabetes mellitus with hyperglycemia: Secondary | ICD-10-CM

## 2022-06-14 DIAGNOSIS — E1159 Type 2 diabetes mellitus with other circulatory complications: Secondary | ICD-10-CM

## 2022-06-14 DIAGNOSIS — J4540 Moderate persistent asthma, uncomplicated: Secondary | ICD-10-CM

## 2022-06-14 NOTE — Patient Instructions (Signed)
Please call the care guide team at [phone] if you need to cancel or reschedule your appointment.   If you are experiencing a Mental Health or [place] or need someone to talk to, please call the Suicide and Crisis Lifeline: 988 call the [place] National Suicide Prevention Lifeline: 7373962457 or TTY: [phone] TTY 3345663269) to talk to a trained counselor call 1-800-273-TALK (toll free, 24 hour hotline)   Following is a copy of the CCM Program Consent:  CCM service includes personalized support from designated clinical staff supervised by the physician, including individualized plan of care and coordination with other care providers 24/7 contact phone numbers for assistance for urgent and routine care needs. Service will only be billed when office clinical staff spend 20 minutes or more in a month to coordinate care. Only one practitioner may furnish and bill the service in a calendar month. The patient may stop CCM services at amy time (effective at the end of the month) by phone call to the office staff. The patient will be responsible for cost sharing (co-pay) or up to 20% of the service fee (after annual deductible is met)  Following is a copy of your full provider care plan:   Goals Addressed             This Visit's Progress    CCM Expected Outcome:  Monitor, Self-Manage and Reduce Symptoms of Diabetes       Current Barriers:  Knowledge Deficits related to the need to check blood sugars and keep blood sugars in normal range Care Coordination needs related to the importance of monitoring blood sugars, eating healthy, maintaining good control of DM in a patient with DM Chronic Disease Management support and education needs related to effective management of DM Literacy barriers  Lab Results  Component Value Date   HGBA1C 9.9 (H) 03/23/2022  Saw endocrinologist recently A1C was 8.5%   Planned Interventions: Provided education to patient about basic  DM disease process. The patient needs ongoing support and education with effective management of DM. The patients wife states currently he is eating better and checking his blood sugars and writing them down; Reviewed medications with patient and discussed importance of medication adherence. The patient is compliant with medications. The endocrinologist wants him to wear an insulin pump. The patient and the patients wife do not feel it is safe for him to wear the pump as the patient is not able to manage the pump. The patient and his wife want a referral to a new endocrinologist as they feel that they are being pushed to do something that mentally the patient is not able to do. Reviewed prescribed diet with patient heart healthy/ADA. The patient is doing better with ADA and options that will help with maintaining healthy blood sugars but having a hard time refraining from salt ; Counseled on importance of regular laboratory monitoring as prescribed. Review of goals of A1C and the patient is hoping his next A1C is much better;        Discussed plans with patient for ongoing care management follow up and provided patient with direct contact information for care management team;      Provided patient with written educational materials related to hypo and hyperglycemia and importance of correct treatment. The patient has had a couple falls due to his blood sugars getting down to 60's. The patients wife states he was concerned about his blood sugars being too high so he has not been eating like he was supposed  to. Education on safety and monitoring for changes in the way he feels. Denies and lows. Highest he has seen is 183.        Reviewed scheduled/upcoming provider appointments including: 06-25-2022 at 140 pm Advised patient, providing education and rationale, to check cbg before meals and at bedtime and when you have symptoms of low or high blood sugar and record. The endocrinologist had given the patient a  continuous glucose reader but he kept knocking it off of his arm. He does not wish to keep wearing this. Education on monitoring for changes and letting the provider know for acute changes.   The patients stated blood sugar today at the time of the call was 157.  The patients wife gave several readings to the [facility]: [phone], and 157.       call provider for findings outside established parameters;       Review of patient status, including review of consultants reports, relevant laboratory and other test results, and medications completed;       Advised patient to discuss changes in DM, questions, or concerns with provider;     Sees eye specialist in January. Is possibly going to have new surgery to help vision  Screening for signs and symptoms of depression related to chronic disease state;        Assessed social determinant of health barriers;         Symptom Management: Take medications as prescribed   Attend all scheduled provider appointments Call provider office for new concerns or questions  call the Suicide and Crisis Lifeline: 988 call the [place] National Suicide Prevention Lifeline: 307-389-5995 or TTY: 2407270021 TTY (281)855-6100) to talk to a trained counselor call 1-800-273-TALK (toll free, 24 hour hotline) if experiencing a Mental Health or [place]  keep appointment with eye doctor check feet daily for cuts, sores or redness trim toenails straight across manage portion size wash and dry feet carefully every day wear comfortable, cotton socks wear comfortable, well-fitting shoes  Follow Up Plan: Telephone follow up appointment with care management team member scheduled for: 07-05-2022 at 1145 am       CCM Expected Outcome:  Monitor, Self-Manage and Reduce Symptoms of: Asthma       Current Barriers:  Knowledge Deficits related to triggers that can cause exacerbation of his asthma Chronic Disease Management support and education needs related to  effective management of Asthma  Planned Interventions: Provided patient with basic written and verbal Asthma education on self care/management/and exacerbation prevention. Denies any issues with his breathing. The patient states his breathing is stable. Monitoring for changes in his breathing related to allergies. Denies any new concerns at this time; Advised patient to track and manage Asthma triggers. Review of the weather changes, being aware of people with infections, and monitoring for changes in his breathing;  Provided written and verbal instructions on pursed lip breathing and utilized returned demonstration as teach back; Provided instruction about proper use of medications used for management of Asthma including inhalers; Advised patient to self assesses Asthma action plan zone and make appointment with provider if in the yellow zone for 48 hours without improvement; Advised patient to engage in light exercise as tolerated 3-5 days a week to aid in the the management of Asthma; Provided education about and advised patient to utilize infection prevention strategies to reduce risk of respiratory infection. Education and support given; Discussed the importance of adequate rest and management of fatigue with Asthma; Screening for signs and symptoms  of depression related to chronic disease state;  Assessed social determinant of health barriers;   Symptom Management: Take medications as prescribed   Attend all scheduled provider appointments Call provider office for new concerns or questions  call the Suicide and Crisis Lifeline: 988 call the [place] National Suicide Prevention Lifeline: (828)235-7104 or TTY: (908) 559-6403 TTY (602)202-1053) to talk to a trained counselor call 1-800-273-TALK (toll free, 24 hour hotline) if experiencing a Mental Health or [place]   Follow Up Plan: Telephone follow up appointment with care management team member scheduled for: 07-05-2022 at 1145  am        CCM Expected Outcome:  Monitor, Self-Manage, and Reduce Symptoms of Hypertension       Current Barriers:  Knowledge Deficits related to the importance for following a heart healthy/ADA diet and the need to restrict sodium in dietary habits  Chronic Disease Management support and education needs related to effective management of HTN  BP Readings from Last 3 Encounters:  05/29/22 115/77  05/28/22 135/72  05/22/22 135/75     Planned Interventions: Evaluation of current treatment plan related to hypertension self management and patient's adherence to plan as established by provider. The patients blood pressures are more stable now. The patient is trying to do better with his health and watching what he does. Provided education to patient re: stroke prevention, s/s of heart attack and stroke; Reviewed prescribed diet heart healthy/ADA diet. The patient is more mindful of what he is eating and watching what he eats. He does like salt and sometimes uses too much salt. This is an ongoing obstacle but his wife states she is being diligent in trying to get him to understand the importance of not eating salt and this will help his blood pressures too.  Education and support given.  Reviewed medications with patient and discussed importance of compliance. The patient states compliance with his medications. Works with the [place] [facility] for effective management of medications;  Discussed plans with patient for ongoing care management follow up and provided patient with direct contact information for care management team; Advised patient, providing education and rationale, to monitor blood pressure daily and record, calling PCP for findings outside established parameters;  Reviewed scheduled/upcoming provider appointments including: 06-25-2022 at 140 pm Advised patient to discuss changes in blood pressures or heart health with provider; Provided education on prescribed diet heart healthy/ADA diet.  Education and support given. Review of heart healthy/ADA choices ;  Discussed complications of poorly controlled blood pressure such as heart disease, stroke, circulatory complications, vision complications, kidney impairment, sexual dysfunction;  Screening for signs and symptoms of depression related to chronic disease state;  Assessed social determinant of health barriers;   Symptom Management: Take medications as prescribed   Attend all scheduled provider appointments Call provider office for new concerns or questions  call the Suicide and Crisis Lifeline: 988 call the [place] National Suicide Prevention Lifeline: 609-629-7030 or TTY: 907-409-0420 TTY (320)737-2858) to talk to a trained counselor call 1-800-273-TALK (toll free, 24 hour hotline) if experiencing a Mental Health or [place]  check blood pressure 3 times per week learn about high blood pressure keep a blood pressure log take blood pressure log to all doctor appointments call doctor for signs and symptoms of high blood pressure develop an action plan for high blood pressure keep all doctor appointments take medications for blood pressure exactly as prescribed report new symptoms to your doctor  Follow Up Plan: Telephone follow up appointment with care  management team member scheduled for: 07-05-2022 at 1145 am          The patient verbalized understanding of instructions, educational materials, and care plan provided today and DECLINED offer to receive copy of patient instructions, educational materials, and care plan.   Telephone follow up appointment with care management team member scheduled for: 07-05-2022 at 1145 am

## 2022-06-14 NOTE — Chronic Care Management (AMB) (Signed)
Chronic Care Management   CCM RN Visit Note  06/14/2022 Name: [name]. MRN: [id number] DOB: Aug 18, 1960  Subjective: [name]. is a 62 y.o. year old male who is a primary care patient of Cannady, Alex Faster, NP. The patient was referred to the Chronic Care Management team for assistance with care management needs subsequent to provider initiation of CCM services and plan of care.    Today's Visit:  Engaged with patient by telephone for follow up visit.        Goals Addressed             This Visit's Progress    CCM Expected Outcome:  Monitor, Self-Manage and Reduce Symptoms of Diabetes       Current Barriers:  Knowledge Deficits related to the need to check blood sugars and keep blood sugars in normal range Care Coordination needs related to the importance of monitoring blood sugars, eating healthy, maintaining good control of DM in a patient with DM Chronic Disease Management support and education needs related to effective management of DM Literacy barriers  Lab Results  Component Value Date   HGBA1C 9.9 (H) 03/23/2022  Saw endocrinologist recently A1C was 8.5%   Planned Interventions: Provided education to patient about basic DM disease process. The patient needs ongoing support and education with effective management of DM. The patients wife states currently he is eating better and checking his blood sugars and writing them down; Reviewed medications with patient and discussed importance of medication adherence. The patient is compliant with medications. The endocrinologist wants him to wear an insulin pump. The patient and the patients wife do not feel it is safe for him to wear the pump as the patient is not able to manage the pump. The patient and his wife want a referral to a new endocrinologist as they feel that they are being pushed to do something that mentally the patient is not able to do. Reviewed prescribed diet with patient heart  healthy/ADA. The patient is doing better with ADA and options that will help with maintaining healthy blood sugars but having a hard time refraining from salt ; Counseled on importance of regular laboratory monitoring as prescribed. Review of goals of A1C and the patient is hoping his next A1C is much better;        Discussed plans with patient for ongoing care management follow up and provided patient with direct contact information for care management team;      Provided patient with written educational materials related to hypo and hyperglycemia and importance of correct treatment. The patient has had a couple falls due to his blood sugars getting down to 60's. The patients wife states he was concerned about his blood sugars being too high so he has not been eating like he was supposed to. Education on safety and monitoring for changes in the way he feels. Denies and lows. Highest he has seen is 183.        Reviewed scheduled/upcoming provider appointments including: 06-25-2022 at 140 pm Advised patient, providing education and rationale, to check cbg before meals and at bedtime and when you have symptoms of low or high blood sugar and record. The endocrinologist had given the patient a continuous glucose reader but he kept knocking it off of his arm. He does not wish to keep wearing this. Education on monitoring for changes and letting the provider know for acute changes.   The patients stated blood sugar today at the time  of the call was 157.  The patients wife gave several readings to the [facility]: [phone], and 157.       call provider for findings outside established parameters;       Review of patient status, including review of consultants reports, relevant laboratory and other test results, and medications completed;       Advised patient to discuss changes in DM, questions, or concerns with provider;     Sees eye specialist in January. Is possibly going to have new surgery to help vision   Screening for signs and symptoms of depression related to chronic disease state;        Assessed social determinant of health barriers;         Symptom Management: Take medications as prescribed   Attend all scheduled provider appointments Call provider office for new concerns or questions  call the Suicide and Crisis Lifeline: 988 call the [place] National Suicide Prevention Lifeline: (425)014-1114 or TTY: 872-492-5281 TTY (986) 458-5747) to talk to a trained counselor call 1-800-273-TALK (toll free, 24 hour hotline) if experiencing a Mental Health or [place]  keep appointment with eye doctor check feet daily for cuts, sores or redness trim toenails straight across manage portion size wash and dry feet carefully every day wear comfortable, cotton socks wear comfortable, well-fitting shoes  Follow Up Plan: Telephone follow up appointment with care management team member scheduled for: 07-05-2022 at 1145 am       CCM Expected Outcome:  Monitor, Self-Manage and Reduce Symptoms of: Asthma       Current Barriers:  Knowledge Deficits related to triggers that can cause exacerbation of his asthma Chronic Disease Management support and education needs related to effective management of Asthma  Planned Interventions: Provided patient with basic written and verbal Asthma education on self care/management/and exacerbation prevention. Denies any issues with his breathing. The patient states his breathing is stable. Monitoring for changes in his breathing related to allergies. Denies any new concerns at this time; Advised patient to track and manage Asthma triggers. Review of the weather changes, being aware of people with infections, and monitoring for changes in his breathing;  Provided written and verbal instructions on pursed lip breathing and utilized returned demonstration as teach back; Provided instruction about proper use of medications used for management of Asthma including  inhalers; Advised patient to self assesses Asthma action plan zone and make appointment with provider if in the yellow zone for 48 hours without improvement; Advised patient to engage in light exercise as tolerated 3-5 days a week to aid in the the management of Asthma; Provided education about and advised patient to utilize infection prevention strategies to reduce risk of respiratory infection. Education and support given; Discussed the importance of adequate rest and management of fatigue with Asthma; Screening for signs and symptoms of depression related to chronic disease state;  Assessed social determinant of health barriers;   Symptom Management: Take medications as prescribed   Attend all scheduled provider appointments Call provider office for new concerns or questions  call the Suicide and Crisis Lifeline: 988 call the [place] National Suicide Prevention Lifeline: 418-269-1910 or TTY: 681-207-5925 TTY 234-226-3834) to talk to a trained counselor call 1-800-273-TALK (toll free, 24 hour hotline) if experiencing a Mental Health or [place] Crisis   Follow Up Plan: Telephone follow up appointment with care management team member scheduled for: 07-05-2022 at 1145 am        CCM Expected Outcome:  Monitor, Self-Manage, and Reduce Symptoms  of Hypertension       Current Barriers:  Knowledge Deficits related to the importance for following a heart healthy/ADA diet and the need to restrict sodium in dietary habits  Chronic Disease Management support and education needs related to effective management of HTN  BP Readings from Last 3 Encounters:  05/29/22 115/77  05/28/22 135/72  05/22/22 135/75     Planned Interventions: Evaluation of current treatment plan related to hypertension self management and patient's adherence to plan as established by provider. The patients blood pressures are more stable now. The patient is trying to do better with his health and watching what he  does. Provided education to patient re: stroke prevention, s/s of heart attack and stroke; Reviewed prescribed diet heart healthy/ADA diet. The patient is more mindful of what he is eating and watching what he eats. He does like salt and sometimes uses too much salt. This is an ongoing obstacle but his wife states she is being diligent in trying to get him to understand the importance of not eating salt and this will help his blood pressures too.  Education and support given.  Reviewed medications with patient and discussed importance of compliance. The patient states compliance with his medications. Works with the [place] [facility] for effective management of medications;  Discussed plans with patient for ongoing care management follow up and provided patient with direct contact information for care management team; Advised patient, providing education and rationale, to monitor blood pressure daily and record, calling PCP for findings outside established parameters;  Reviewed scheduled/upcoming provider appointments including: 06-25-2022 at 140 pm Advised patient to discuss changes in blood pressures or heart health with provider; Provided education on prescribed diet heart healthy/ADA diet. Education and support given. Review of heart healthy/ADA choices ;  Discussed complications of poorly controlled blood pressure such as heart disease, stroke, circulatory complications, vision complications, kidney impairment, sexual dysfunction;  Screening for signs and symptoms of depression related to chronic disease state;  Assessed social determinant of health barriers;   Symptom Management: Take medications as prescribed   Attend all scheduled provider appointments Call provider office for new concerns or questions  call the Suicide and Crisis Lifeline: 988 call the [place] National Suicide Prevention Lifeline: 6402623430 or TTY: 442-437-7655 TTY (253) 527-4394) to talk to a trained counselor call 1-800-273-TALK  (toll free, 24 hour hotline) if experiencing a Mental Health or [place]  check blood pressure 3 times per week learn about high blood pressure keep a blood pressure log take blood pressure log to all doctor appointments call doctor for signs and symptoms of high blood pressure develop an action plan for high blood pressure keep all doctor appointments take medications for blood pressure exactly as prescribed report new symptoms to your doctor  Follow Up Plan: Telephone follow up appointment with care management team member scheduled for: 07-05-2022 at 1145 am          Plan:Telephone follow up appointment with care management team member scheduled for:  07-05-2022 at 1145 am  Noreene Larsson RN, MSN, CCM RN Care Manager  Chronic Care Management Direct Number: [phone]

## 2022-06-17 DIAGNOSIS — Z794 Long term (current) use of insulin: Secondary | ICD-10-CM

## 2022-06-17 DIAGNOSIS — E1159 Type 2 diabetes mellitus with other circulatory complications: Secondary | ICD-10-CM | POA: Diagnosis not present

## 2022-06-17 DIAGNOSIS — I10 Essential (primary) hypertension: Secondary | ICD-10-CM | POA: Diagnosis not present

## 2022-06-17 DIAGNOSIS — J45909 Unspecified asthma, uncomplicated: Secondary | ICD-10-CM | POA: Diagnosis not present

## 2022-06-18 ENCOUNTER — Ambulatory Visit: Payer: Medicare Other

## 2022-06-21 DIAGNOSIS — E7849 Other hyperlipidemia: Secondary | ICD-10-CM | POA: Diagnosis not present

## 2022-06-21 DIAGNOSIS — K219 Gastro-esophageal reflux disease without esophagitis: Secondary | ICD-10-CM | POA: Diagnosis not present

## 2022-06-21 DIAGNOSIS — E1165 Type 2 diabetes mellitus with hyperglycemia: Secondary | ICD-10-CM | POA: Diagnosis not present

## 2022-06-21 DIAGNOSIS — I10 Essential (primary) hypertension: Secondary | ICD-10-CM | POA: Diagnosis not present

## 2022-06-23 NOTE — Patient Instructions (Incomplete)
Diabetes Mellitus Basics  Diabetes mellitus, or diabetes, is a long-term (chronic) disease. It occurs when the body does not properly use sugar (glucose) that is released from food after you eat. Diabetes mellitus may be caused by one or both of these problems: Your pancreas does not make enough of a hormone called insulin. Your body does not react in a normal way to the insulin that it makes. Insulin lets glucose enter cells in your body. This gives you energy. If you have diabetes, glucose cannot get into cells. This causes high blood glucose (hyperglycemia). How to treat and manage diabetes You may need to take insulin or other diabetes medicines daily to keep your glucose in balance. If you are prescribed insulin, you will learn how to give yourself insulin by injection. You may need to adjust the amount of insulin you take based on the foods that you eat. You will need to check your blood glucose levels using a glucose monitor as told by your health care provider. The readings can help determine if you have low or high blood glucose. Generally, you should have these blood glucose levels: Before meals (preprandial): 80-130 mg/dL (4.4-7.2 mmol/L). After meals (postprandial): below 180 mg/dL (10 mmol/L). Hemoglobin A1c (HbA1c) level: less than 7%. Your health care provider will set treatment goals for you. Keep all follow-up visits. This is important. Follow these instructions at home: Diabetes medicines Take your diabetes medicines every day as told by your health care provider. List your diabetes medicines here: Name of medicine: ______________________________ Amount (dose): _______________ Time (a.m./p.m.): _______________ Notes: ___________________________________ Name of medicine: ______________________________ Amount (dose): _______________ Time (a.m./p.m.): _______________ Notes: ___________________________________ Name of medicine: ______________________________ Amount (dose):  _______________ Time (a.m./p.m.): _______________ Notes: ___________________________________ Insulin If you use insulin, list the types of insulin you use here: Insulin type: ______________________________ Amount (dose): _______________ Time (a.m./p.m.): _______________Notes: ___________________________________ Insulin type: ______________________________ Amount (dose): _______________ Time (a.m./p.m.): _______________ Notes: ___________________________________ Insulin type: ______________________________ Amount (dose): _______________ Time (a.m./p.m.): _______________ Notes: ___________________________________ Insulin type: ______________________________ Amount (dose): _______________ Time (a.m./p.m.): _______________ Notes: ___________________________________ Insulin type: ______________________________ Amount (dose): _______________ Time (a.m./p.m.): _______________ Notes: ___________________________________ Managing blood glucose  Check your blood glucose levels using a glucose monitor as told by your health care provider. Write down the times that you check your glucose levels here: Time: _______________ Notes: ___________________________________ Time: _______________ Notes: ___________________________________ Time: _______________ Notes: ___________________________________ Time: _______________ Notes: ___________________________________ Time: _______________ Notes: ___________________________________ Time: _______________ Notes: ___________________________________  Low blood glucose Low blood glucose (hypoglycemia) is when glucose is at or below 70 mg/dL (3.9 mmol/L). Symptoms may include: Feeling: Hungry. Sweaty and clammy. Irritable or easily upset. Dizzy. Sleepy. Having: A fast heartbeat. A headache. A change in your vision. Numbness around the mouth, lips, or tongue. Having trouble with: Moving (coordination). Sleeping. Treating low blood glucose To treat low blood  glucose, eat or drink something containing sugar right away. If you can think clearly and swallow safely, follow the 15:15 rule: Take 15 grams of a fast-acting carb (carbohydrate), as told by your health care provider. Some fast-acting carbs are: Glucose tablets: take 3-4 tablets. Hard candy: eat 3-5 pieces. Fruit juice: drink 4 oz (120 mL). Regular (not diet) soda: drink 4-6 oz (120-180 mL). Honey or sugar: eat 1 Tbsp (15 mL). Check your blood glucose levels 15 minutes after you take the carb. If your glucose is still at or below 70 mg/dL (3.9 mmol/L), take 15 grams of a carb again. If your glucose does not go above 70 mg/dL (3.9 mmol/L) after   3 tries, get help right away. After your glucose goes back to normal, eat a meal or a snack within 1 hour. Treating very low blood glucose If your glucose is at or below 54 mg/dL (3 mmol/L), you have very low blood glucose (severe hypoglycemia). This is an emergency. Do not wait to see if the symptoms will go away. Get medical help right away. Call your local emergency services (911 in the [place] Do not drive yourself to the hospital. Questions to ask your health care provider Should I talk with a diabetes educator? What equipment will I need to care for myself at home? What diabetes medicines do I need? When should I take them? How often do I need to check my blood glucose levels? What number can I call if I have questions? When is my follow-up visit? Where can I find a support group for people with diabetes? Where to find more information American Diabetes Association: www.diabetes.org Association of Diabetes Care and Education Specialists: www.diabeteseducator.org Contact a health care provider if: Your blood glucose is at or above 240 mg/dL (13.3 mmol/L) for 2 days in a row. You have been sick or have had a fever for 2 days or more, and you are not getting better. You have any of these problems for more than 6 hours: You cannot eat or  drink. You feel nauseous. You vomit. You have diarrhea. Get help right away if: Your blood glucose is lower than 54 mg/dL (3 mmol/L). You get confused. You have trouble thinking clearly. You have trouble breathing. These symptoms may represent a serious problem that is an emergency. Do not wait to see if the symptoms will go away. Get medical help right away. Call your local emergency services (911 in the [place] Do not drive yourself to the hospital. Summary Diabetes mellitus is a chronic disease that occurs when the body does not properly use sugar (glucose) that is released from food after you eat. Take insulin and diabetes medicines as told. Check your blood glucose every day, as often as told. Keep all follow-up visits. This is important. This information is not intended to replace advice given to you by your health care provider. Make sure you discuss any questions you have with your health care provider. Document Revised: 07/07/2019 Document Reviewed: 07/07/2019 Elsevier Patient Education  2023 Elsevier Inc.  

## 2022-06-25 ENCOUNTER — Encounter: Payer: Self-pay | Admitting: Nurse Practitioner

## 2022-06-25 ENCOUNTER — Ambulatory Visit (INDEPENDENT_AMBULATORY_CARE_PROVIDER_SITE_OTHER): Payer: Medicare Other | Admitting: Nurse Practitioner

## 2022-06-25 VITALS — BP 124/72 | HR 53 | Temp 97.7°F | Ht 64.02 in | Wt 174.6 lb

## 2022-06-25 DIAGNOSIS — Z7984 Long term (current) use of oral hypoglycemic drugs: Secondary | ICD-10-CM

## 2022-06-25 DIAGNOSIS — E785 Hyperlipidemia, unspecified: Secondary | ICD-10-CM | POA: Diagnosis not present

## 2022-06-25 DIAGNOSIS — E1165 Type 2 diabetes mellitus with hyperglycemia: Secondary | ICD-10-CM | POA: Diagnosis not present

## 2022-06-25 DIAGNOSIS — Z794 Long term (current) use of insulin: Secondary | ICD-10-CM

## 2022-06-25 DIAGNOSIS — Z0000 Encounter for general adult medical examination without abnormal findings: Secondary | ICD-10-CM | POA: Diagnosis not present

## 2022-06-25 DIAGNOSIS — K219 Gastro-esophageal reflux disease without esophagitis: Secondary | ICD-10-CM

## 2022-06-25 DIAGNOSIS — E6609 Other obesity due to excess calories: Secondary | ICD-10-CM

## 2022-06-25 DIAGNOSIS — E1169 Type 2 diabetes mellitus with other specified complication: Secondary | ICD-10-CM | POA: Diagnosis not present

## 2022-06-25 DIAGNOSIS — K760 Fatty (change of) liver, not elsewhere classified: Secondary | ICD-10-CM

## 2022-06-25 DIAGNOSIS — I152 Hypertension secondary to endocrine disorders: Secondary | ICD-10-CM

## 2022-06-25 DIAGNOSIS — E701 Other hyperphenylalaninemias: Secondary | ICD-10-CM

## 2022-06-25 DIAGNOSIS — Z8673 Personal history of transient ischemic attack (TIA), and cerebral infarction without residual deficits: Secondary | ICD-10-CM

## 2022-06-25 DIAGNOSIS — R011 Cardiac murmur, unspecified: Secondary | ICD-10-CM

## 2022-06-25 DIAGNOSIS — E1129 Type 2 diabetes mellitus with other diabetic kidney complication: Secondary | ICD-10-CM

## 2022-06-25 DIAGNOSIS — R809 Proteinuria, unspecified: Secondary | ICD-10-CM

## 2022-06-25 DIAGNOSIS — Z6830 Body mass index (BMI) 30.0-30.9, adult: Secondary | ICD-10-CM

## 2022-06-25 DIAGNOSIS — J4540 Moderate persistent asthma, uncomplicated: Secondary | ICD-10-CM | POA: Diagnosis not present

## 2022-06-25 DIAGNOSIS — E559 Vitamin D deficiency, unspecified: Secondary | ICD-10-CM | POA: Diagnosis not present

## 2022-06-25 DIAGNOSIS — E1159 Type 2 diabetes mellitus with other circulatory complications: Secondary | ICD-10-CM | POA: Diagnosis not present

## 2022-06-25 LAB — BAYER DCA HB A1C WAIVED: HB A1C (BAYER DCA - WAIVED): 9.1 % — ABNORMAL HIGH (ref 4.8–5.6)

## 2022-06-25 MED ORDER — ALBUTEROL SULFATE HFA 108 (90 BASE) MCG/ACT IN AERS: 2.0000 | INHALATION_SPRAY | Freq: Four times a day (QID) | RESPIRATORY_TRACT | 0 refills | Status: DC | PRN

## 2022-06-25 NOTE — Assessment & Plan Note (Signed)
Chronic, ongoing.  FEV1 70% at check in 2021.  Tolerating Breo, to continue to use daily.  Recommend using Albuterol as needed only if episodes of SOB present. Return in 3 months. Repeat spirometry next visit. °

## 2022-06-25 NOTE — Assessment & Plan Note (Signed)
Chronic, ongoing.  A1c 9.1% today, slight trend down from 9.9% -- do not have recent endo notes will work on obtaining -- he continues to be non compliant and reports endo will not place his insulin pump.  Urine ALB 150 January 2024, continue Lisinopril. - Continue Jardiance, Januvia, + insulin as ordered by endo (however he reports they will not place pump, which unsure of).  Recommend they reach out to endo to alert them to insulin pump needs.  [place] can not travel out of county for endo due to ankle monitor. - Poor tolerance to Metformin in past with AKI presenting x 2 trials and allergic to Sulfa. Would avoid GLP at this time due to patient history of pancreatitis x 3, concern this would flare.  Educated patient at length on effect of diabetes from head to toe and increased risk for recurrent CVA due to poor control.   - Recommend they check his BS TID -- he would benefit from [facility] will work on this with CCM team in future.   - Goal A1C <6.5% due to CVA history -- which reiterated at length with patient today.  Continue collaboration with CCM team.   - Continue collaboration with endo, will obtain notes - Foot and eye exam up to date. - ACE and statin on board.

## 2022-06-25 NOTE — Assessment & Plan Note (Signed)
Chronic, stable.  BP at goal in office today.  Will continue Lisinopril 10 MG dosing, further adjust next visit if remains on lower side.  Labs: CMP.  Urine ALB 150 January 2024.  Recommend they check his BP at home at least daily and document + focus on DASH diet.  Return to office in 3 months.

## 2022-06-25 NOTE — Assessment & Plan Note (Signed)
BMI 29.96, maintaining some weight loss.  Recommended eating smaller high protein, low fat meals more frequently and exercising 30 mins a day 5 times a week with a goal of 10-15lb weight loss in the next 3 months. Patient voiced their understanding and motivation to adhere to these recommendations.

## 2022-06-25 NOTE — Assessment & Plan Note (Signed)
Chronic, ongoing.  Continue Omeprazole daily and adjust as needed.  Mag level up to date.  Risks of PPI use were discussed with patient including bone loss, C. Diff diarrhea, pneumonia, infections, CKD, electrolyte abnormalities.  Verbalizes understanding and chooses to continue the medication.  

## 2022-06-25 NOTE — Assessment & Plan Note (Signed)
Ongoing, stable.  Continue supplement and recheck level today. °

## 2022-06-25 NOTE — Assessment & Plan Note (Signed)
Chronic, ongoing.  A1c 9.1% today, slight trend down from 9.9% -- do not have recent endo notes will work on obtaining -- he continues to be non compliant and reports endo will not place his insulin pump.  Urine ALB 150 January 2024, continue Lisinopril. - Continue Jardiance, Januvia, + insulin as ordered by endo (however he reports they will not place pump, which unsure of).  Recommend they reach out to endo to alert them to insulin pump needs.  He can not travel out of county for endo due to ankle monitor. - Poor tolerance to Metformin in past with AKI presenting x 2 trials and allergic to Sulfa. Would avoid GLP at this time due to patient history of pancreatitis x 3, concern this would flare.  Educated patient at length on effect of diabetes from head to toe and increased risk for recurrent CVA due to poor control.   - Recommend they check his BS TID -- he would benefit from [facility] will work on this with CCM team in future.   - Goal A1C <6.5% due to CVA history -- which reiterated at length with patient today.  Continue collaboration with CCM team.   - Continue collaboration with endo, will obtain notes - Foot and eye exam up to date.

## 2022-06-25 NOTE — Assessment & Plan Note (Signed)
Continue collaboration with neurology and continue statin + ASA/Plavix.  Discussed goals BP <130/90, A1C < 6.5%, and LDL <55.  He is poorly controlled with diabetes, much related to knowledge base and poor diet.  Continue collaboration with endo and neuro.

## 2022-06-25 NOTE — Assessment & Plan Note (Addendum)
Chronic, ongoing.  Continue Lipitor, Vascepa, and Fenofibrate. Lipid panel today.  Adjust doses as needed, if LDL >70.

## 2022-06-25 NOTE — Progress Notes (Deleted)
BP 124/72   Pulse (!) 53   Temp 97.7 F (36.5 C) (Oral)   Ht 5' 4.02" (1.626 m)   Wt 174 lb 9.6 oz (79.2 kg)   SpO2 96%   BMI 29.96 kg/m    Subjective:    Patient ID: Alex Dam., male    DOB: Jul 25, 1960, 62 y.o.   MRN: [id number]  HPI: [name]. is a 62 y.o. male  Chief Complaint  Patient presents with   Diabetes   Hypertension   Hyperlipidemia   Asthma   Gastroesophageal Reflux   His wife is not present with him in room today, she provides more accurate history.  DIABETES & PHENYLKETONURIA A1c 9.9% January. Seen by endocrinology (Dr. Patrecia Pace) last on 03/07/22 -- per notes in chart -- he reports an upcoming visit with him.  Insulin changes were made and Dexcom discussed.  Taking long acting insulin and sliding scale, however on review of note see discussion of insulin pump due to patient ongoing elevated A1c and inconsistent administration of medication.  He reports he has the pump, but they have to get a hold of someone in [place] so they can put it in and have had no contact with them.  He reports office does not place pump.  We attempted to get into endo at [facility] or [place], but due to ankle monitor he can not leave county line.  Continues [place], [place], and [place] with sliding scale.  He is working on diet changes with his wife, less soda intake reported -- eating more veggies.  Had phenylketonuria since childhood and monitors diet best he can per his report.    Has tried Metformin in past x 2 both times AKI presented, history of pancreatitis and can not take GLP1.  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 Dr. Welton Flakes for cardiology, has not seen in awhile, issues with copay and can not see him yet he reports -- they want to do a stress test he reports.    Neurology = Dr. Malvin Johns on 11/09/19 -- he is to continue Plavix and ASA per their note. Saw vascular on 07/24/21 -- is scheduled for doppler imaging on 09/24/22.  Current medications Lisinopril, Rosuvastatin, Fenofibrate, Vascepa, NTG.   Satisfied with current treatment? yes Duration of hypertension: chronic BP monitoring frequency: not checking BP range:  BP medication side effects: no Duration of hyperlipidemia: chronic Cholesterol medication side effects: no Cholesterol supplements: none Medication compliance: good compliance Aspirin: yes Recent stressors: no Recurrent headaches: no Visual changes: no Palpitations: no Dyspnea: no Chest pain: no Lower extremity edema: no Dizzy/lightheaded: no  PROTEINURIA Last visit with nephrology on 06/05/22, had renal ultrasound which was reassuring. Proteinuria and CKD Stage II.   Does take Vitamin D supplement weekly. CKD status: stable Medications renally dose: yes Previous renal evaluation: yes Pneumovax:  Up to Date Influenza Vaccine:  Up to Date   ASTHMA Using Breo daily and Singulair. Asthma  status: stable Satisfied with current treatment?: yes Albuterol/rescue inhaler frequency: does not have one Dyspnea frequency: occasional with activity and weather changes Wheezing frequency: none Cough frequency: none Nocturnal symptom frequency: none Limitation of activity: no Current upper respiratory symptoms: no Aerochamber/spacer use: no Visits to ER or Urgent Care in past year: no Pneumovax: Up to Date Influenza: Up  to Date  GERD  Continues on Omeprazole 20 MG daily. Had visit with GI on 05/28/22 for fatty liver disease with no evidence of fibrosis on ultrasound 2018.  Had gall bladder removed 04/02/22. GERD control status: stable Satisfied with current treatment? yes Heartburn frequency: occasional Medication side effects: no  Medication compliance: stable Previous GERD medications: Antacid use frequency:  none Dysphagia: no Odynophagia:  no Hematemesis: no Blood in stool: no EGD: yes   Relevant past medical, surgical, family and social history reviewed and updated as indicated. Interim medical history since our last visit reviewed. Allergies and medications reviewed and updated.  Review of Systems  Constitutional:  Negative for activity change, diaphoresis, fatigue and fever.  Respiratory:  Negative for cough, chest tightness, shortness of breath and wheezing.   Cardiovascular:  Negative for chest pain, palpitations and leg swelling.  Gastrointestinal: Negative.   Endocrine: Negative for cold intolerance, heat intolerance, polydipsia, polyphagia and polyuria.  Neurological: Negative.   Psychiatric/Behavioral: Negative.     Per HPI unless specifically indicated above     Objective:    BP 124/72   Pulse (!) 53   Temp 97.7 F (36.5 C) (Oral)   Ht 5' 4.02" (1.626 m)   Wt 174 lb 9.6 oz (79.2 kg)   SpO2 96%   BMI 29.96 kg/m   Wt Readings from Last 3 Encounters:  06/25/22 174 lb 9.6 oz (79.2 kg)  05/29/22 169 lb 3.2 oz (76.7 kg)  05/28/22 174 lb (78.9 kg)    Physical Exam Vitals and nursing note reviewed.  Constitutional:      General: He is awake. He is not in acute distress.    Appearance: He is well-developed. He is obese. He is not ill-appearing.  HENT:     Head: Normocephalic and atraumatic.     Right Ear: Hearing normal. No drainage.     Left Ear: Hearing normal. No drainage.  Eyes:     General: Lids are normal.        Right eye: No discharge.        Left eye: No  discharge.     Conjunctiva/sclera: Conjunctivae normal.     Pupils: Pupils are equal, round, and reactive to light.  Neck:     Thyroid: No thyromegaly.     Vascular: Carotid bruit (R>L) present.  Cardiovascular:     Rate and Rhythm: Normal rate and regular rhythm.     Heart sounds: S1 normal and S2 normal. Murmur heard.     Systolic murmur is present with a grade of 2/6.     No gallop.     Comments: Systolic murmur noted best at left sternal border, soft. Pulmonary:     Effort: Pulmonary effort is normal. No accessory muscle usage or respiratory distress.     Breath sounds: Normal breath sounds.  Abdominal:     General: Bowel sounds are normal.     Palpations: Abdomen is soft.  Musculoskeletal:     Cervical back: Normal range of motion and neck supple.     Right lower leg: No edema.     Left lower leg: No edema.  Skin:  General: Skin is warm and dry.     Capillary Refill: Capillary refill takes less than 2 seconds.  Neurological:     Mental Status: He is alert and oriented to person, place, and time.     Deep Tendon Reflexes: Reflexes are normal and symmetric.  Psychiatric:        Attention and Perception: Attention normal.        Mood and Affect: Mood normal.        Speech: Speech normal.        Behavior: Behavior normal. Behavior is cooperative.    Results for orders placed or performed during the hospital encounter of 04/02/22  Glucose, capillary  Result Value Ref Range   Glucose-Capillary 163 (H) 70 - 99 mg/dL  Glucose, capillary  Result Value Ref Range   Glucose-Capillary 228 (H) 70 - 99 mg/dL  Surgical pathology  Result Value Ref Range   SURGICAL PATHOLOGY      SURGICAL PATHOLOGY CASE: [id number] PATIENT: Katina DungWILLIE Ake Surgical Pathology Report     Specimen Submitted: A. Gallbladder  Clinical History: Chronic calculous cholecystitis    DIAGNOSIS: A. GALLBLADDER; CHOLECYSTECTOMY: - CHRONIC CHOLECYSTITIS WITH CHOLELITHIASIS. - NEGATIVE FOR  DYSPLASIA AND MALIGNANCY.  GROSS DESCRIPTION: A. Labeled: Gallbladder Received: Fresh Collection time: 10:33 AM on 04/02/2022 Placed into formalin time: 12:33 PM on 04/02/2022 Size of specimen: 6.5 x 2.9 x 1.0 cm Specimen integrity: Disrupted External surface: Yellow and glistening with a roughened hepatic surface and adherent fibrofatty tissue.  There is a 0.2 x 0.2 cm perforation on the hepatic surface Wall thickness: 0.4 cm Mucosa: The mucosa is tan and velvety with focal areas of cholesterol flecking Cystic duct: The cystic duct is 0.7 x 0.7 x 0.2 cm and patent.  No adjacent lymph nodes are identified. Bile present: Yes, red and viscous Stones present: Multiple blac k stones are present within the lumen, 1.0 x 0.3 x 0.2 cm in aggregate Other findings: None noted  Block summary: 1 -cystic duct margin, en face and inked black with representative wall  CM 04/02/2022  Final Diagnosis performed by Katherine MantleHeath Jones, MD.   Electronically signed 04/03/2022 9:07:45AM The electronic signature indicates that the named Attending Pathologist has evaluated the specimen Technical component performed at [facility], [address], [place], [place] [address] Lab: [phone] Dir: Collen Vincent SchimkeSanjai Nagendra, MD, MMM  Professional component performed at [facility], [facility], [address], [place], [place] [address] Lab: [phone] Dir: Beryle QuantHeath M. Jones, MD       Assessment & Plan:   Problem List Items Addressed This Visit       Cardiovascular and Mediastinum   Hypertension associated with diabetes   Relevant Orders   Bayer DCA Hb A1c Waived   Comprehensive metabolic panel     Digestive   NAFLD (nonalcoholic fatty liver disease)     Endocrine   Hyperlipidemia associated with type 2 diabetes mellitus   Relevant Orders   Bayer DCA Hb A1c Waived   Comprehensive metabolic panel   Lipid Panel w/o Chol/HDL Ratio   Type 2 diabetes mellitus with proteinuria   Relevant Orders   Bayer  DCA Hb A1c Waived   Uncontrolled type 2 diabetes mellitus with hyperglycemia   Relevant Orders   Bayer DCA Hb A1c Waived     Other   Heart murmur   History of CVA (cerebrovascular accident)   Obesity   Phenylketonuria (PKU)   Vitamin D deficiency   Relevant Orders   VITAMIN D 25 Hydroxy (Vit-D Deficiency, Fractures)  Other Visit Diagnoses     Medicare annual wellness visit, subsequent    -  Primary   Medicare wellness due and performed with patient today.        Follow up plan: Return in about 3 months (around 09/24/2022) for T2DM, HTN/HLD, GERD, CKD, ASTHMA,.

## 2022-06-25 NOTE — Assessment & Plan Note (Signed)
Grade 2/6 on auscultation, continue collaboration with cardiology.   

## 2022-06-25 NOTE — Progress Notes (Signed)
BP 124/72   Pulse (!) 53   Temp 97.7 F (36.5 C) (Oral)   Ht 5' 4.02" (1.626 m)   Wt 174 lb 9.6 oz (79.2 kg)   SpO2 96%   BMI 29.96 kg/m    Subjective:    Patient ID: Alex DamWillie M Grajeda Jr., male    DOB: Nov 09, 1960, 62 y.o.   MRN: [id number]  HPI: Alex DamWillie M Mesick Jr. is a 62 y.o. male presenting on 06/25/2022 for Medicare Wellness and follow-up visit. Current medical complaints include:none  He currently lives with: wife Interim Problems from his last visit: no  His wife is not present with him in room today, she provides more accurate history.  DIABETES & PHENYLKETONURIA A1c 9.9% January. Seen by endocrinology (Dr. Patrecia PaceMorayati) last on 03/07/22 -- per notes in chart -- he reports an upcoming visit with him.  Insulin changes were made and Dexcom discussed.  Taking long acting insulin and sliding scale, however on review of note see discussion of insulin pump due to patient ongoing elevated A1c and inconsistent administration of medication.  He reports he has the pump, but they have to get a [place] of [place] in [place] so they can put it in and have had no contact with them.  He reports office does not place pump.  We [facility] or [place], but due to ankle monitor he [place] not leave county line.  Continues SturgeonJardiance, BarbertonJanuvia, Walker Lakeresiba, and Lyumjev with sliding scale.  He is working on diet changes with his wife, less soda intake reported -- eating more veggies.  Had phenylketonuria since childhood and monitors diet best he can per his report.    Has tried Metformin in past x 2 both times AKI presented, history of pancreatitis and can not take GLP1.  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 Dr. Welton FlakesKhan for cardiology, has not seen in awhile, issues with copay and can not see him yet he reports -- they want to do a stress test he reports.    Neurology = Dr. Malvin JohnsPotter on 11/09/19 -- he is to continue Plavix and ASA per their note. Saw vascular on 07/24/21 -- is scheduled for doppler imaging on 09/24/22.  Current medications Lisinopril, Rosuvastatin, Fenofibrate, Vascepa, NTG.   Satisfied with current treatment? yes Duration of hypertension: chronic BP monitoring frequency: not checking BP range:  BP medication side effects: no Duration of hyperlipidemia: chronic Cholesterol medication side effects: no Cholesterol supplements: none Medication compliance: good compliance Aspirin: yes Recent stressors: no Recurrent headaches: no Visual changes: no Palpitations: no Dyspnea: no Chest pain: no Lower extremity edema: no Dizzy/lightheaded: no  PROTEINURIA Last visit with nephrology on 06/05/22, had renal ultrasound which was reassuring. Proteinuria and CKD Stage II.   Does take Vitamin D supplement weekly. CKD status: stable Medications renally dose: yes Previous renal evaluation: yes Pneumovax:  Up to Date Influenza Vaccine:  Up to Date   ASTHMA Using Breo daily and  Singulair. Asthma status: stable Satisfied with current treatment?: yes Albuterol/rescue inhaler frequency: does not have one Dyspnea frequency: occasional with activity and weather changes Wheezing frequency: none Cough frequency: none Nocturnal symptom frequency: none Limitation of activity: no Current upper respiratory symptoms: no Aerochamber/spacer use: no Visits to ER or Urgent Care  in past year: no Pneumovax: Up to Date Influenza: Up to Date  GERD  Continues on Omeprazole 20 MG daily. Had visit with GI on 05/28/22 for fatty liver disease with no evidence of fibrosis on ultrasound 2018.  Had gall bladder removed 04/02/22. GERD control status: stable Satisfied with current treatment? yes Heartburn frequency: occasional Medication side effects: no  Medication compliance: stable Previous GERD medications: Antacid use frequency:  none Dysphagia: no Odynophagia:  no Hematemesis: no Blood in stool: no EGD: yes   Functional Status Survey: Is the patient deaf or have difficulty hearing?: No Does the patient have difficulty seeing, even when wearing glasses/contacts?: No Does the patient have difficulty concentrating, remembering, or making decisions?: Yes Does the patient have difficulty walking or climbing stairs?: No Does the patient have difficulty dressing or bathing?: No Does the patient have difficulty doing errands alone such as visiting a doctor's office or shopping?: No     08/01/2021    1:23 PM 02/07/2022    9:20 AM 03/29/2022   10:17 AM 04/17/2022    1:14 PM 06/25/2022    5:16 PM  Fall Risk  Falls in the past year? 1 1 0 0 0  Was there an injury with Fall? 1 1 0  0  Fall Risk Category Calculator 3 3 0  0  Fall Risk Category (Retired) [employer] Low    (RETIRED) Patient Fall Risk Level High fall risk High fall risk     Patient at Risk for Falls Due to History of fall(s) History of fall(s);Other (Comment)   No Fall Risks  Patient at Risk for Falls Due to - Comments  blood sugars dropped and has had 2 falls recently due to blood sugars     Fall risk Follow up Falls evaluation completed Falls evaluation completed;Education provided;Falls prevention discussed   Falls prevention discussed       06/25/2022    5:18 PM 12/25/2021    5:05 PM 08/01/2021    1:23 PM 07/26/2021    3:16 PM 05/24/2021    3:54 PM  Depression screen PHQ 2/9  Decreased Interest 0 0 3 0 0   Down, Depressed, Hopeless 0 0 0 0 3  PHQ - 2 Score 0 0 3 0 3  Altered sleeping 0  0 0 0  Tired, decreased energy 0  3 3 3   Change in appetite 0  0 0 3  Feeling bad or failure about yourself  0  0 0 3  Trouble concentrating 0  1 1 3   Moving slowly or fidgety/restless 0  1 3 3   Suicidal thoughts 0  0 0 0  PHQ-9 Score 0  8 7 18   Difficult doing work/chores Not difficult at all           06/25/2022    5:18 PM 08/01/2021    1:23 PM 07/26/2021    3:16 PM 05/24/2021    3:55 PM  GAD 7 : Generalized Anxiety Score  Nervous, Anxious, on Edge 0 0 3 2  Control/stop worrying 0 0 0 0  Worry too much - different things 0 0 0 0  Trouble relaxing 0 0 0 0  Restless 0 1  0 2  Easily annoyed or irritable 0 1 1 3   Afraid - awful might happen 0 0 0 2  Total GAD 7 Score 0 2 4 9   Anxiety Difficulty Not difficult at all   Somewhat difficult    Advanced Directives Does patient have a HCPOA?    no If yes, name and contact information:  Does patient have a living will or MOST form?  no  Past Medical History:  Past Medical History:  Diagnosis Date   Arrhythmia    Asthma    CCC (chronic calculous cholecystitis)    COPD (chronic obstructive pulmonary disease)    Cyst of kidney, acquired    Diabetes mellitus without complication 2013   type 2   Diverticulosis    Edentulous    Fatty liver    Gallbladder polyp    GERD (gastroesophageal reflux disease)    Heart murmur    History of chicken pox    History of measles as a child    History of PKU    Hyperlipidemia    Hypertension    IBS (irritable bowel syndrome)    Irregular heart beat    Mentally challenged    Pancreatitis    PONV (postoperative nausea and vomiting)    Stroke 03/02/2019   vision issues - right eye/right sided weakness   Vision impairment    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    Surgical History:  Past Surgical History:  Procedure Laterality Date   Cardiac Catherization     [facility]   CARDIAC CATHETERIZATION     [facility]    CATARACT EXTRACTION W/PHACO Left 06/05/2021   Procedure: CATARACT EXTRACTION PHACO AND INTRAOCULAR LENS PLACEMENT (IOC) LEFT DIABETIC 3.75 00:28.0;  Surgeon: Nevada Crane, MD;  Location: [facility];  Service: Ophthalmology;  Laterality: Left;  Diabetic   CATARACT EXTRACTION W/PHACO Right 07/10/2021   Procedure: CATARACT EXTRACTION PHACO AND INTRAOCULAR LENS PLACEMENT (IOC) RIGHT DIABETIC;  Surgeon: Nevada Crane, MD;  Location: [facility];  Service: Ophthalmology;  Laterality: Right;  Diabetic 2.66 00:24.4   COLONOSCOPY     COLONOSCOPY WITH PROPOFOL N/A 08/10/2020   Procedure: COLONOSCOPY WITH PROPOFOL;  Surgeon: Toney Reil, MD;  Location: [facility] ENDOSCOPY;  Service: Gastroenterology;  Laterality: N/A;  Has ankle monitor; (Parole officer "Ms Ward Givens needs a time before Monday [phone]" - per patient's wife)   ESOPHAGOGASTRODUODENOSCOPY (EGD) WITH PROPOFOL N/A 12/27/2016   Procedure: ESOPHAGOGASTRODUODENOSCOPY (EGD) WITH PROPOFOL;  Surgeon: Toney Reil, MD;  Location: [facility] END[facility]COPY;  Service: Gastroenterology;  Laterality: N/A;   HEMORRHOID SURGERY     KNEE SURGERY     cyst removed   Ligament Removal Left    of left thumb: dr. Alberteen Spindle   ligament removal  of left thumb     Dr. Alberteen Spindle   NM GATED MYOCARDIAL STUDY (ARMX HX)  06/23/2014   Paraschos. Normal   WISDOM TOOTH EXTRACTION      Medications:  Current Outpatient Medications on File Prior to Visit  Medication Sig   aspirin 81 MG EC tablet Take 1 tablet (81 mg total) by mouth daily.   Blood Glucose Monitoring Suppl (ONE TOUCH ULTRA 2) w/Device KIT Use to check blood sugar 4 times a day   Cholecalciferol 1.25 MG (50000 UT) TABS Take 1 tablet by mouth once a week.   clopidogrel (PLAVIX) 75 MG tablet Take 1 tablet (75 mg total) by mouth daily.   empagliflozin (JARDIANCE) 25 MG TABS tablet Take 1 tablet (25 mg  total) by mouth daily.   EPINEPHRINE 0.3 mg/0.3 mL IJ SOAJ injection INJECT 1 SYRINGE  INTO OUTER THIGH ONCE AS NEEDED FOR SEVERE ALLERGIC REACTION. (Patient taking differently: Inject 0.3 mg into the muscle as needed.)   fenofibrate 54 MG tablet Take 1 tablet (54 mg total) by mouth daily.   fluticasone furoate-vilanterol (BREO ELLIPTA) 100-25 MCG/ACT AEPB Inhale 1 puff into the lungs daily.   gabapentin (NEURONTIN) 300 MG capsule Take 1 capsule (300 mg total) by mouth 2 (two) times daily.   glucose blood (ONETOUCH ULTRA) test strip TEST BLOOD SUGAR 5 TIMES A DAY   icosapent Ethyl (VASCEPA) 1 g capsule Take 1 capsule (1 g total) by mouth 2 (two) times daily.   Insulin Degludec (TRESIBA) 100 UNIT/ML SOLN Inject 40 Units into the skin 2 (two) times daily.   Insulin Disposable Pump (OMNIPOD DASH PDM, GEN 4,) KIT AS PRESCRIBED USE AS DIRECTED BASED ON TOTAL DAILY INSULIN AND POD CHANGE FREQUENCY   Insulin Pen Needle (PEN NEEDLES) 32G X 4 MM MISC 1 kit by Does not apply route daily.   Lancets (ONETOUCH DELICA PLUS LANCET33G) MISC USE AS DIRECTED.   lisinopril (ZESTRIL) 20 MG tablet Take 1 tablet (20 mg total) by mouth daily.   LYUMJEV KWIKPEN 100 UNIT/ML KwikPen Per endocrinology directions-SS -- inject into skin 3 times a day prior to meals   Misc. Devices (PULSE OXIMETER FOR FINGER) MISC To check O2 saturations once daily with asthma and document + check if any shortness of breath   montelukast (SINGULAIR) 10 MG tablet Take 1 tablet (10 mg total) by mouth at bedtime.   mupirocin ointment (BACTROBAN) 2 % Apply 1 Application topically 2 (two) times daily.   nitroGLYCERIN (NITROSTAT) 0.4 MG SL tablet Place 1 tablet (0.4 mg total) under the tongue every 5 (five) minutes as needed for chest pain.   omeprazole (PRILOSEC) 20 MG capsule Take 1 capsule (20 mg total) by mouth daily.   rosuvastatin (CRESTOR) 40 MG tablet Take 1 tablet (40 mg total) by mouth daily.   sitaGLIPtin (JANUVIA) 100 MG tablet Take 1 tablet (100 mg total) by mouth daily.   tiZANidine (ZANAFLEX) 2 MG tablet Take 1 tablet  (2 mg total) by mouth every 6 (six) hours as needed for muscle spasms. (Patient taking differently: Take 2 mg by mouth every 6 (six) hours.)   triamcinolone cream (KENALOG) 0.1 % Apply 1 Application topically 2 (two) times daily. (Patient taking differently: Apply 1 Application topically 2 (two) times daily. Arms and legs)   ULTRACARE PEN NEEDLES 32G X 5 MM MISC    No current facility-administered medications on file prior to visit.    Allergies:  Allergies  Allergen Reactions   Sulfa Antibiotics Shortness Of Breath and Nausea And Vomiting   Bee Venom Hives and Swelling    All kinds of bees   Morphine Nausea And Vomiting   Other     Certain powders    Social History:  Social History   Socioeconomic History   Marital status: Married    Spouse name: Not on file   Number of children: 0   Years of education: Not on file   Highest education level: 12th grade  Occupational History   Occupation: Disabled  Tobacco Use   Smoking status: Never    Passive exposure: Never   Smokeless tobacco: Never  Vaping Use   Vaping Use: Never used  Substance and Sexual Activity   Alcohol use: No    Alcohol/week: 0.0 standard  drinks of alcohol   Drug use: No   Sexual activity: Not Currently  Other Topics Concern   Not on file  Social History Narrative   ** Merged History Encounter **       ** Data from: 10/22/14 Enc Dept: [facility]       ** Data from: 10/07/14 Enc Dept: [facility]   Arrest: Arrested in 2012 for indecent liberties, required to wear ankle monitor   Social Determinants of Health   Financial Resource Strain: High Risk (06/25/2022)   Overall Financial Resource Strain (CARDIA)    Difficulty of Paying Living Expenses: Hard  Food Insecurity: No Food Insecurity (06/25/2022)   Hunger Vital Sign    Worried About Running Out of Food in the Last Year: Never true    Ran Out of Food in the Last Year: Never true  Transportation Needs: No Transportation Needs  (06/25/2022)   PRAPARE - Administrator, Civil Service (Medical): No    Lack of Transportation (Non-Medical): No  Physical Activity: Inactive (06/25/2022)   Exercise Vital Sign    Days of Exercise per Week: 0 days    Minutes of Exercise per Session: 0 min  Stress: Stress Concern Present (06/25/2022)   [employer] of Occupational Health - Occupational Stress Questionnaire    Feeling of Stress : To some extent  Social Connections: Moderately Isolated (06/25/2022)   Social Connection and Isolation Panel [NHANES]    Frequency of Communication with Friends and Family: Three times a week    Frequency of Social Gatherings with Friends and Family: Three times a week    Attends Religious Services: Never    Active Member of Clubs or Organizations: No    Attends Banker Meetings: Never    Marital Status: Married  Catering manager Violence: Not At Risk (06/25/2022)   Humiliation, Afraid, Rape, and Kick questionnaire    Fear of Current or Ex-Partner: No    Emotionally Abused: No    Physically Abused: No    Sexually Abused: No   Social History   Tobacco Use  Smoking Status Never   Passive exposure: Never  Smokeless Tobacco Never   Social History   Substance and Sexual Activity  Alcohol Use No   Alcohol/week: 0.0 standard drinks of alcohol    Family History:  Family History  Problem Relation Age of Onset   Cancer Mother        throat   Diabetes Father    Heart disease Father    Cancer Maternal Grandmother    Cancer Maternal Grandfather        pancreatic   Cancer Paternal Grandfather     Past medical history, surgical history, medications, allergies, family history and social history reviewed with patient today and changes made to appropriate areas of the chart.   ROS All other ROS negative except what is listed above and in the HPI.      Objective:    BP 124/72   Pulse (!) 53   Temp 97.7 F (36.5 C) (Oral)   Ht 5' 4.02" (1.626 m)   Wt 174 lb 9.6  oz (79.2 kg)   SpO2 96%   BMI 29.96 kg/m   Wt Readings from Last 3 Encounters:  06/25/22 174 lb 9.6 oz (79.2 kg)  05/29/22 169 lb 3.2 oz (76.7 kg)  05/28/22 174 lb (78.9 kg)    Physical Exam Vitals and nursing note reviewed.  Constitutional:      General: He  is awake. He is not in acute distress.    Appearance: He is well-developed, well-groomed and overweight. He is not ill-appearing.  HENT:     Head: Normocephalic and atraumatic.     Right Ear: Hearing normal. No drainage.     Left Ear: Hearing normal. No drainage.  Eyes:     General: Lids are normal.        Right eye: No discharge.        Left eye: No discharge.     Conjunctiva/sclera: Conjunctivae normal.     Pupils: Pupils are equal, round, and reactive to light.  Neck:     Thyroid: No thyromegaly.     Vascular: Carotid bruit (R>L) present.  Cardiovascular:     Rate and Rhythm: Regular rhythm. Bradycardia present.     Heart sounds: S1 normal and S2 normal. Murmur heard.     Systolic murmur is present with a grade of 2/6.     No gallop.     Comments: Systolic murmur noted best at left sternal border, soft. Pulmonary:     Effort: Pulmonary effort is normal. No accessory muscle usage or respiratory distress.     Breath sounds: Normal breath sounds.  Abdominal:     General: Bowel sounds are normal.     Palpations: Abdomen is soft.  Musculoskeletal:     Cervical back: Normal range of motion and neck supple.     Right lower leg: No edema.     Left lower leg: No edema.  Skin:    General: Skin is warm and dry.     Capillary Refill: Capillary refill takes less than 2 seconds.     Findings: No rash.  Neurological:     Mental Status: He is alert and oriented to person, place, and time.     Deep Tendon Reflexes: Reflexes are normal and symmetric.  Psychiatric:        Attention and Perception: Attention normal.        Mood and Affect: Mood normal.        Speech: Speech normal.        Behavior: Behavior normal. Behavior  is cooperative.       06/25/2022    5:18 PM 05/03/2021   12:14 PM 05/15/2016    1:39 PM  6CIT Screen  What Year? 0 points 0 points 0 points  What month? 0 points 0 points 3 points  What time? 0 points 0 points 3 points  Count back from 20 2 points 0 points 0 points  Months in reverse 2 points 0 points 0 points  Repeat phrase 0 points 0 points 10 points  Total Score 4 points 0 points 16 points   Results for orders placed or performed in visit on 06/25/22  Bayer DCA Hb A1c Waived  Result Value Ref Range   HB A1C (BAYER DCA - WAIVED) 9.1 (H) 4.8 - 5.6 %      Assessment & Plan:   Problem List Items Addressed This Visit       Cardiovascular and Mediastinum   Hypertension associated with diabetes    Chronic, stable.  BP at goal in office today.  Will continue Lisinopril 10 MG dosing, further adjust next visit if remains on lower side.  Labs: CMP.  Urine ALB 150 January 2024.  Recommend they check his BP at home at least daily and document + focus on DASH diet.  Return to office in 3 months.      Relevant Orders  Bayer DCA Hb A1c Waived (Completed)   Comprehensive metabolic panel     Respiratory   Asthma    Chronic, ongoing.  FEV1 70% at check in 2021.  Tolerating Breo, to continue to use daily.  Recommend using Albuterol as needed only if episodes of SOB present. Return in 3 months. Repeat spirometry next visit.      Relevant Medications   albuterol (VENTOLIN HFA) 108 (90 Base) MCG/ACT inhaler     Digestive   GERD (gastroesophageal reflux disease)    Chronic, ongoing.  Continue Omeprazole daily and adjust as needed.  Mag level up to date.  Risks of PPI use were discussed with patient including bone loss, C. Diff diarrhea, pneumonia, infections, CKD, electrolyte abnormalities.  Verbalizes understanding and chooses to continue the medication.       NAFLD (nonalcoholic fatty liver disease)    Ongoing, recent CT scan reassuring.  Continue collaboration with GI provider.         Endocrine   Hyperlipidemia associated with type 2 diabetes mellitus    Chronic, ongoing.  Continue Lipitor, Vascepa, and Fenofibrate. Lipid panel today.  Adjust doses as needed, if LDL >70.      Relevant Orders   Bayer DCA Hb A1c Waived (Completed)   Comprehensive metabolic panel   Lipid Panel w/o Chol/HDL Ratio   Type 2 diabetes mellitus with proteinuria    Chronic, ongoing.  A1c 9.1% today, slight trend down from 9.9% -- do not have recent endo notes will work on obtaining -- he continues to be non compliant and reports endo will not place his insulin pump.  Urine ALB 150 January 2024, continue Lisinopril. - Continue Jardiance, Januvia, + insulin as ordered by endo (however he reports they will not place pump, which unsure of).  Recommend they reach out to endo to alert them to insulin pump needs.  He can not travel out of county for endo due to ankle monitor. - Poor tolerance to Metformin in past with AKI presenting x 2 trials and allergic to Sulfa. Would avoid GLP at this time due to patient history of pancreatitis x 3, concern this would flare.  Educated patient at length on effect of diabetes from head to toe and increased risk for recurrent CVA due to poor control.   - Recommend they check his BS TID -- he would benefit from [facility] will work on this with CCM team in future.   - Goal A1C <6.5% due to CVA history -- which reiterated at length with patient today.  Continue collaboration with CCM team.   - Continue collaboration with endo, will obtain notes - Foot and eye exam up to date. - ACE and statin on board.      Relevant Orders   Bayer DCA Hb A1c Waived (Completed)   Uncontrolled type 2 diabetes mellitus with hyperglycemia    Chronic, ongoing.  A1c 9.1% today, slight trend down from 9.9% -- do not have recent endo notes will work on obtaining -- he continues to be non compliant and reports endo will not place his insulin pump.  Urine ALB 150 January 2024, continue  Lisinopril. - Continue Jardiance, Januvia, + insulin as ordered by endo (however he reports they will not place pump, which unsure of).  Recommend they reach out to endo to alert them to insulin pump needs.  He can not travel out of county for endo due to ankle monitor. - Poor tolerance to Metformin in past with AKI presenting x 2 trials  and allergic to Sulfa. Would avoid GLP at this time due to patient history of pancreatitis x 3, concern this would flare.  Educated patient at length on effect of diabetes from head to toe and increased risk for recurrent CVA due to poor control.   - Recommend they check his BS TID -- he would benefit from [facility] will work on this with CCM team in future.   - Goal A1C <6.5% due to CVA history -- which reiterated at length with patient today.  Continue collaboration with CCM team.   - Continue collaboration with endo, will obtain notes - Foot and eye exam up to date.      Relevant Orders   Bayer DCA Hb A1c Waived (Completed)     Other   Heart murmur    Grade 2/6 on auscultation, continue collaboration with cardiology.        History of CVA (cerebrovascular accident)    Continue collaboration with neurology and continue statin + ASA/Plavix.  Discussed goals BP <130/90, A1C < 6.5%, and LDL <55.  He is poorly controlled with diabetes, much related to knowledge base and poor diet.  Continue collaboration with endo and neuro.      Obesity    BMI 29.96, maintaining some weight loss.  Recommended eating smaller high protein, low fat meals more frequently and exercising 30 mins a day 5 times a week with a goal of 10-15lb weight loss in the next 3 months. Patient voiced their understanding and motivation to adhere to these recommendations.       Phenylketonuria (PKU)    Mental delays present, continue to work with CCM team in assisting with education and reiterating diet needs.      Vitamin D deficiency    Ongoing, stable.  Continue supplement and  recheck level today.      Relevant Orders   VITAMIN D 25 Hydroxy (Vit-D Deficiency, Fractures)   Other Visit Diagnoses     Medicare annual wellness visit, subsequent    -  Primary   Medicare wellness due and performed with patient today.       LABORATORY TESTING:  Health maintenance labs ordered today as discussed above.   IMMUNIZATIONS:   - Tdap: Tetanus vaccination status reviewed: last tetanus booster within 10 years. - Influenza: Up to date - Pneumovax: Up to date - Prevnar: Not applicable - Zostavax vaccine: Refused  SCREENING: - Colonoscopy: Up to date  Discussed with patient purpose of the colonoscopy is to detect colon cancer at curable precancerous or early stages   - AAA Screening: Not applicable  -Hearing Test: Not applicable  -Spirometry: Not applicable   PATIENT COUNSELING:    Sexuality: Discussed sexually transmitted diseases, partner selection, use of condoms, avoidance of unintended pregnancy  and contraceptive alternatives.   Advised to avoid cigarette smoking.  I discussed with the patient that most people either abstain from alcohol or drink within safe limits (<=14/week and <=4 drinks/occasion for males, <=7/weeks and <= 3 drinks/occasion for females) and that the risk for alcohol disorders and other health effects rises proportionally with the number of drinks per week and how often a drinker exceeds daily limits.  Discussed cessation/primary prevention of drug use and availability of treatment for abuse.   Diet: Encouraged to adjust caloric intake to maintain  or achieve ideal body weight, to reduce intake of dietary saturated fat and total fat, to limit sodium intake by avoiding high sodium foods and not adding table salt, and to maintain adequate  dietary potassium and calcium preferably from fresh fruits, vegetables, and low-fat dairy products.    Stressed the importance of regular exercise  Injury prevention: Discussed safety belts, safety  helmets, smoke detector, smoking near bedding or upholstery.   Dental health: Discussed importance of regular tooth brushing, flossing, and dental visits.   Follow up plan: NEXT PREVENTATIVE PHYSICAL DUE IN 1 YEAR. Return in about 3 months (around 09/24/2022) for T2DM, HTN/HLD, GERD, CKD, ASTHMA (40 minute visit).

## 2022-06-25 NOTE — Assessment & Plan Note (Signed)
Mental delays present, continue to work with CCM team in assisting with education and reiterating diet needs. 

## 2022-06-25 NOTE — Assessment & Plan Note (Signed)
Ongoing, recent CT scan reassuring.  Continue collaboration with GI provider. 

## 2022-06-26 ENCOUNTER — Other Ambulatory Visit: Payer: Self-pay | Admitting: Nurse Practitioner

## 2022-06-26 ENCOUNTER — Ambulatory Visit (INDEPENDENT_AMBULATORY_CARE_PROVIDER_SITE_OTHER): Payer: Medicare Other

## 2022-06-26 ENCOUNTER — Ambulatory Visit: Payer: Medicare Other | Admitting: Nurse Practitioner

## 2022-06-26 VITALS — BP 122/64 | Ht 64.0 in | Wt 173.2 lb

## 2022-06-26 DIAGNOSIS — E1169 Type 2 diabetes mellitus with other specified complication: Secondary | ICD-10-CM

## 2022-06-26 DIAGNOSIS — Z0000 Encounter for general adult medical examination without abnormal findings: Secondary | ICD-10-CM | POA: Diagnosis not present

## 2022-06-26 LAB — COMPREHENSIVE METABOLIC PANEL
ALT: 29 IU/L (ref 0–44)
AST: 26 IU/L (ref 0–40)
Albumin/Globulin Ratio: 1.9 (ref 1.2–2.2)
Albumin: 4.1 g/dL (ref 3.8–4.9)
Alkaline Phosphatase: 183 IU/L — ABNORMAL HIGH (ref 44–121)
BUN/Creatinine Ratio: 19 (ref 9–20)
BUN: 18 mg/dL (ref 6–24)
Bilirubin Total: <0.2 mg/dL (ref 0.0–1.2)
CO2: 22 mmol/L (ref 20–29)
Calcium: 9.1 mg/dL (ref 8.7–10.2)
Chloride: 102 mmol/L (ref 96–106)
Creatinine, Ser: 0.96 mg/dL (ref 0.76–1.27)
Globulin, Total: 2.2 g/dL (ref 1.5–4.5)
Glucose: 302 mg/dL — ABNORMAL HIGH (ref 70–99)
Potassium: 4.4 mmol/L (ref 3.5–5.2)
Sodium: 138 mmol/L (ref 134–144)
Total Protein: 6.3 g/dL (ref 6.0–8.5)
eGFR: 91 mL/min/{1.73_m2} (ref >59)

## 2022-06-26 LAB — LIPID PANEL W/O CHOL/HDL RATIO
Cholesterol, Total: 199 mg/dL (ref 100–199)
HDL: 21 mg/dL — ABNORMAL LOW (ref >39)
Triglycerides: 1131 mg/dL (ref 0–149)

## 2022-06-26 LAB — VITAMIN D 25 HYDROXY (VIT D DEFICIENCY, FRACTURES): Vit D, 25-Hydroxy: 23.7 ng/mL — ABNORMAL LOW (ref 30.0–100.0)

## 2022-06-26 NOTE — Progress Notes (Signed)
Please let [name] and [name] know his labs have returned: - His triglycerides are again very high at 1,131, normal being 0 to 149, this is a major concern as can lead to pancreatitis if too high.  Is he taking his Fenofibrate, Vascepa, and Rosuvastatin daily?  I assume he was not fasting for these labs and need him to return for first thing in morning fasting labs to recheck these.  Please ensure you are taking your medications as ordered daily.  We also need to get diabetes under control to help these, so please ensure to attend you appointment with Dr. Patrecia Pace upcoming. - Kidney and liver function are normal. - Vitamin D remains low, please ensure you are taking supplement as ordered.  Any questions?  Please schedule fasting lab visit early morning. Keep being stellar!!  Thank you for allowing me to participate in your care.  I appreciate you. Kindest regards, Recardo Linn

## 2022-06-26 NOTE — Patient Instructions (Addendum)
[name] , Thank you for taking time to come for your Medicare Wellness Visit. I appreciate your ongoing commitment to your health goals. Please review the following plan we discussed and let me know if I can assist you in the future.   These are the goals we discussed:  Goals      CCM Expected Outcome:  Monitor, Self-Manage and Reduce Symptoms of Diabetes     Current Barriers:  Knowledge Deficits related to the need to check blood sugars and keep blood sugars in normal range Care Coordination needs related to the importance of monitoring blood sugars, eating healthy, maintaining good control of DM in a patient with DM Chronic Disease Management support and education needs related to effective management of DM Literacy barriers  Lab Results  Component Value Date   HGBA1C 9.9 (H) 03/23/2022  Saw endocrinologist recently A1C was 8.5%   Planned Interventions: Provided education to patient about basic DM disease process. The patient needs ongoing support and education with effective management of DM. The patients wife states currently he is eating better and checking his blood sugars and writing them down; Reviewed medications with patient and discussed importance of medication adherence. The patient is compliant with medications. The endocrinologist wants him to wear an insulin pump. The patient and the patients wife do not feel it is safe for him to wear the pump as the patient is not able to manage the pump. The patient and his wife want a referral to a new endocrinologist as they feel that they are being pushed to do something that mentally the patient is not able to do. Reviewed prescribed diet with patient heart healthy/ADA. The patient is doing better with ADA and options that will help with maintaining healthy blood sugars but having a hard time refraining from salt ; Counseled on importance of regular laboratory monitoring as prescribed. Review of goals of A1C and the patient is  hoping his next A1C is much better;        Discussed plans with patient for ongoing care management follow up and provided patient with direct contact information for care management team;      Provided patient with written educational materials related to hypo and hyperglycemia and importance of correct treatment. The patient has had a couple falls due to his blood sugars getting down to 60's. The patients wife states he was concerned about his blood sugars being too high so he has not been eating like he was supposed to. Education on safety and monitoring for changes in the way he feels. Denies and lows. Highest he has seen is 183.        Reviewed scheduled/upcoming provider appointments including: 06-25-2022 at 140 pm Advised patient, providing education and rationale, to check cbg before meals and at bedtime and when you have symptoms of low or high blood sugar and record. The endocrinologist had given the patient a continuous glucose reader but he kept knocking it off of his arm. He does not wish to keep wearing this. Education on monitoring for changes and letting the provider know for acute changes.   The patients stated blood sugar today at the time of the call was 157.  The patients [facility] [phone], and 157.       call provider for findings outside established parameters;       Review of patient status, including review of consultants reports, relevant laboratory and other test results, and medications completed;  Advised patient to discuss changes in DM, questions, or concerns with provider;     Sees eye specialist in January. Is possibly going to have new surgery to help vision  Screening for signs and symptoms of depression related to chronic disease state;        Assessed social determinant of health barriers;         Symptom Management: Take medications as prescribed   Attend all scheduled provider appointments Call provider office for new  concerns or questions  call the Suicide and Crisis Lifeline: 988 call the [place] National Suicide Prevention Lifeline: 865-533-1155 or TTY: 424-271-4614 TTY 743-029-5420) to talk to a trained counselor call 1-800-273-TALK (toll free, 24 hour hotline) if experiencing a Mental Health or Behavioral Health Crisis  keep appointment with eye doctor check feet daily for cuts, sores or redness trim toenails straight across manage portion size wash and dry feet carefully every day wear comfortable, cotton socks wear comfortable, well-fitting shoes  Follow Up Plan: Telephone follow up appointment with care management team member scheduled for: 07-05-2022 at 1145 am       CCM Expected Outcome:  Monitor, Self-Manage and Reduce Symptoms of: Asthma     Current Barriers:  Knowledge Deficits related to triggers that can cause exacerbation of his asthma Chronic Disease Management support and education needs related to effective management of Asthma  Planned Interventions: Provided patient with basic written and verbal Asthma education on self care/management/and exacerbation prevention. Denies any issues with his breathing. The patient states his breathing is stable. Monitoring for changes in his breathing related to allergies. Denies any new concerns at this time; Advised patient to track and manage Asthma triggers. Review of the weather changes, being aware of people with infections, and monitoring for changes in his breathing;  Provided written and verbal instructions on pursed lip breathing and utilized returned demonstration as teach back; Provided instruction about proper use of medications used for management of Asthma including inhalers; Advised patient to self assesses Asthma action plan zone and make appointment with provider if in the yellow zone for 48 hours without improvement; Advised patient to engage in light exercise as tolerated 3-5 days a week to aid in the the management of  Asthma; Provided education about and advised patient to utilize infection prevention strategies to reduce risk of respiratory infection. Education and support given; Discussed the importance of adequate rest and management of fatigue with Asthma; Screening for signs and symptoms of depression related to chronic disease state;  Assessed social determinant of health barriers;   Symptom Management: Take medications as prescribed   Attend all scheduled provider appointments Call provider office for new concerns or questions  call the Suicide and Crisis Lifeline: 988 call the [place] National Suicide Prevention Lifeline: 647-041-1929 or TTY: 570-871-0920 TTY 214-359-3705) to talk to a trained counselor call 1-800-273-TALK (toll free, 24 hour hotline) if experiencing a Mental Health or Behavioral Health Crisis   Follow Up Plan: Telephone follow up appointment with care management team member scheduled for: 07-05-2022 at 1145 am        CCM Expected Outcome:  Monitor, Self-Manage, and Reduce Symptoms of Hypertension     Current Barriers:  Knowledge Deficits related to the importance for following a heart healthy/ADA diet and the need to restrict sodium in dietary habits  Chronic Disease Management support and education needs related to effective management of HTN  BP Readings from Last 3 Encounters:  05/29/22 115/77  05/28/22 135/72  05/22/22 135/75  Planned Interventions: Evaluation of current treatment plan related to hypertension self management and patient's adherence to plan as established by provider. The patients blood pressures are more stable now. The patient is trying to do better with his health and watching what he does. Provided education to patient re: stroke prevention, s/s of heart attack and stroke; Reviewed prescribed diet heart healthy/ADA diet. The patient is more mindful of what he is eating and watching what he eats. He does like salt and sometimes uses too much salt.  This is an ongoing obstacle but his wife states she is being diligent in trying to get him to understand the importance of not eating salt and this will help his blood pressures too.  Education and support given.  Reviewed medications with patient and discussed importance of compliance. The patient states compliance with his medications. Works with the pharm D for effective management of medications;  Discussed plans with patient for ongoing care management follow up and provided patient with direct contact information for care management team; Advised patient, providing education and rationale, to monitor blood pressure daily and record, calling PCP for findings outside established parameters;  Reviewed scheduled/upcoming provider appointments including: 06-25-2022 at 140 pm Advised patient to discuss changes in blood pressures or heart health with provider; Provided education on prescribed diet heart healthy/ADA diet. Education and support given. Review of heart healthy/ADA choices ;  Discussed complications of poorly controlled blood pressure such as heart disease, stroke, circulatory complications, vision complications, kidney impairment, sexual dysfunction;  Screening for signs and symptoms of depression related to chronic disease state;  Assessed social determinant of health barriers;   Symptom Management: Take medications as prescribed   Attend all scheduled provider appointments Call provider office for new concerns or questions  call the Suicide and Crisis Lifeline: 988 call the [place] National Suicide Prevention Lifeline: 667-213-8840 or TTY: 937-367-3017 TTY 567 259 5629) to talk to a trained counselor call 1-800-273-TALK (toll free, 24 hour hotline) if experiencing a Mental Health or Behavioral Health Crisis  check blood pressure 3 times per week learn about high blood pressure keep a blood pressure log take blood pressure log to all doctor appointments call doctor for signs and  symptoms of high blood pressure develop an action plan for high blood pressure keep all doctor appointments take medications for blood pressure exactly as prescribed report new symptoms to your doctor  Follow Up Plan: Telephone follow up appointment with care management team member scheduled for: 07-05-2022 at 1145 am       DIET - EAT MORE FRUITS AND VEGETABLES     DIET - INCREASE WATER INTAKE     Recommend increasing water intake to 4 glasses a day.      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 -started in [place] there 24 years Per patient's spouse, their income is limited, with patient's income going towards the household bills Patient's spouse reports that they may have to consider moving at some point, but are hoping they do not have to move Patient's spouse confirmed that they do receive food stamps and they use local food banks regularly for food and clothes Brief review of finances completed to identify opportunities to cut down-no options identified at this time Patient also plans to apply for charity care to assist with patient's medical bills Spouse confirms that patient has applied for Medicaid  but is not eligible Motivational Interviewing employed Depression screen reviewed  Solution-Focused Strategies employed:  Active listening / Reflection utilized  Emotional Support Provided      Increase water intake     Starting 05/15/16, I will start drinking 2-3 glasses of water a day.     Patient Stated     Would like to cut down on salt intake Continue with drinking water 2-3 glasses a day     PharmD- Improve My Heart Health-Coronary Artery Disease     Timeframe:  Long-Range Goal Priority:  High Start Date:                             Expected End Date:                       Follow Up Date 2 month  follow up    - be open to making changes - I can manage, know and watch for signs of a heart attack - if I have chest pain, call for help - learn about small changes that will make a big difference    Why is this important?   Lifestyle changes are key to improving the blood flow to your heart. Think about the things you can change and set a goal to live healthy.  Remember, when the blood vessels to your heart start to get clogged you may not have any symptoms.  Over time, they can get worse.  Don't ignore the signs, like chest pain, and get help right away.     Notes:      PharmD-Track and Manage My Blood Pressure-Hypertension     Timeframe:  Long-Range Goal Priority:  High Start Date:                             Expected End Date:                       Follow Up Date 2 month follow up    - check blood pressure daily - write blood pressure results in a log or diary    Why is this important?   You won't feel high blood pressure, but it can still hurt your blood vessels.  High blood pressure can cause heart or kidney problems. It can also cause a stroke.  Making lifestyle changes like losing a little weight or eating less salt will help.  Checking your blood pressure at home and at different times of the day can help to control blood pressure.  If the doctor prescribes medicine remember to take it the way the doctor ordered.  Call the office if you cannot afford the medicine or if there are questions about it.     Notes:         This is a list of the screening recommended for you and due dates:  Health Maintenance  Topic Date Due   Eye exam for diabetics  05/26/2022   COVID-19 Vaccine (4 - 2023-24 season) 07/11/2022*   Zoster (Shingles) Vaccine (1 of 2) 09/24/2022*   Flu Shot  10/18/2022   Hemoglobin A1C  12/25/2022   Yearly kidney health urinalysis for diabetes  03/24/2023   Yearly kidney function blood test for diabetes  06/25/2023   Complete foot exam   06/25/2023    Medicare Annual Wellness Visit  06/26/2023   Colon  Cancer Screening  08/11/2027   DTaP/Tdap/Td vaccine (2 - Tdap) 01/19/2031   Hepatitis C Screening: USPSTF Recommendation to screen - Ages 67-79 yo.  Completed   HIV Screening  Completed   HPV Vaccine  Aged Out   Cologuard (Stool DNA test)  Discontinued  *Topic was postponed. The date shown is not the original due date.    Advanced directives: no  Conditions/risks identified: none  Next appointment: Follow up in one year for your annual wellness visit 07/02/23 @ 9:15am in person  Preventive Care 40-64 Years, Male Preventive care refers to lifestyle choices and visits with your health care provider that can promote health and wellness. What does preventive care include? A yearly physical exam. This is also called an annual well check. Dental exams once or twice a year. Routine eye exams. Ask your health care provider how often you should have your eyes checked. Personal lifestyle choices, including: Daily care of your teeth and gums. Regular physical activity. Eating a healthy diet. Avoiding tobacco and drug use. Limiting alcohol use. Practicing safe sex. Taking low-dose aspirin every day starting at age 52. What happens during an annual well check? The services and screenings done by your health care provider during your annual well check will depend on your age, overall health, lifestyle risk factors, and family history of disease. Counseling  Your health care provider may ask you questions about your: Alcohol use. Tobacco use. Drug use. Emotional well-being. Home and relationship well-being. Sexual activity. Eating habits. Work and work Astronomer. Screening  You may have the following tests or measurements: Height, weight, and BMI. Blood pressure. Lipid and cholesterol levels. These may be checked every 5 years, or more frequently if you are over 80 years old. Skin check. Lung cancer screening. You may have this  screening every year starting at age 35 if you have a 30-pack-year history of smoking and currently smoke or have quit within the past 15 years. Fecal occult blood test (FOBT) of the stool. You may have this test every year starting at age 86. Flexible sigmoidoscopy or colonoscopy. You may have a sigmoidoscopy every 5 years or a colonoscopy every 10 years starting at age 75. Prostate cancer screening. Recommendations will vary depending on your family history and other risks. Hepatitis C blood test. Hepatitis B blood test. Sexually transmitted disease (STD) testing. Diabetes screening. This is done by checking your blood sugar (glucose) after you have not eaten for a while (fasting). You may have this done every 1-3 years. Discuss your test results, treatment options, and if necessary, the need for more tests with your health care provider. Vaccines  Your health care provider may recommend certain vaccines, such as: Influenza vaccine. This is recommended every year. Tetanus, diphtheria, and acellular pertussis (Tdap, Td) vaccine. You may need a Td booster every 10 years. Zoster vaccine. You may need this after age 65. Pneumococcal 13-valent conjugate (PCV13) vaccine. You may need this if you have certain conditions and have not been vaccinated. Pneumococcal polysaccharide (PPSV23) vaccine. You may need one or two doses if you smoke cigarettes or if you have certain conditions. Talk to your health care provider about which screenings and vaccines you need and how often you need them. This information is not intended to replace advice given to you by your health care provider. Make sure you discuss any questions you have with your health care provider. Document Released: 04/01/2015 Document Revised: 11/23/2015 Document Reviewed: 01/04/2015 Elsevier Interactive Patient Education  2017 [employer].  Fall Prevention in the Home Falls can cause injuries. They can happen to people of all ages. There  are many things you can do to make your home safe and to help prevent falls. What can I do on the outside of my home? Regularly fix the edges of walkways and driveways and fix any cracks. Remove anything that might make you trip as you walk through a door, such as a raised step or threshold. Trim any bushes or trees on the path to your home. Use bright outdoor lighting. Clear any walking paths of anything that might make someone trip, such as rocks or tools. Regularly check to see if handrails are loose or broken. Make sure that both sides of any steps have handrails. Any raised decks and porches should have guardrails on the edges. Have any leaves, snow, or ice cleared regularly. Use sand or salt on walking paths during winter. Clean up any spills in your garage right away. This includes oil or grease spills. What can I do in the bathroom? Use night lights. Install grab bars by the toilet and in the tub and shower. Do not use towel bars as grab bars. Use non-skid mats or decals in the tub or shower. If you need to sit down in the shower, use a plastic, non-slip stool. Keep the floor dry. Clean up any water that spills on the floor as soon as it happens. Remove soap buildup in the tub or shower regularly. Attach bath mats securely with double-sided non-slip rug tape. Do not have throw rugs and other things on the floor that can make you trip. What can I do in the bedroom? Use night lights. Make sure that you have a light by your bed that is easy to reach. Do not use any sheets or blankets that are too big for your bed. They should not hang down onto the floor. Have a firm chair that has side arms. You can use this for support while you get dressed. Do not have throw rugs and other things on the floor that can make you trip. What can I do in the kitchen? Clean up any spills right away. Avoid walking on wet floors. Keep items that you use a lot in easy-to-reach places. If you need to  reach something above you, use a strong step stool that has a grab bar. Keep electrical cords out of the way. Do not use floor polish or wax that makes floors slippery. If you must use wax, use non-skid floor wax. Do not have throw rugs and other things on the floor that can make you trip. What can I do with my stairs? Do not leave any items on the stairs. Make sure that there are handrails on both sides of the stairs and use them. Fix handrails that are broken or loose. Make sure that handrails are as long as the stairways. Check any carpeting to make sure that it is firmly attached to the stairs. Fix any carpet that is loose or worn. Avoid having throw rugs at the top or bottom of the stairs. If you do have throw rugs, attach them to the floor with carpet tape. Make sure that you have a light switch at the top of the stairs and the bottom of the stairs. If you do not have them, ask someone to add them for you. What else can I do to help prevent falls? Wear shoes that: Do not have high heels. Have rubber bottoms. Are comfortable and fit  you well. Are closed at the toe. Do not wear sandals. If you use a stepladder: Make sure that it is fully opened. Do not climb a closed stepladder. Make sure that both sides of the stepladder are locked into place. Ask someone to hold it for you, if possible. Clearly mark and make sure that you can see: Any grab bars or handrails. First and last steps. Where the edge of each step is. Use tools that help you move around (mobility aids) if they are needed. These include: Canes. Walkers. Scooters. Crutches. Turn on the lights when you go into a dark area. Replace any light bulbs as soon as they burn out. Set up your furniture so you have a clear path. Avoid moving your furniture around. If any of your floors are uneven, fix them. If there are any pets around you, be aware of where they are. Review your medicines with your doctor. Some medicines can make  you feel dizzy. This can increase your chance of falling. Ask your doctor what other things that you can do to help prevent falls. This information is not intended to replace advice given to you by your health care provider. Make sure you discuss any questions you have with your health care provider. Document Released: 12/30/2008 Document Revised: 08/11/2015 Document Reviewed: 04/09/2014 Elsevier Interactive Patient Education  2017 [employer].

## 2022-06-26 NOTE — Progress Notes (Signed)
Subjective:   [name]. is a 62 y.o. male who presents for Medicare Annual/Subsequent preventive examination.  Review of Systems     Cardiac Risk Factors include: advanced age (>77men, >81 women);diabetes mellitus;dyslipidemia;hypertension;male gender;sedentary lifestyle     Objective:    Today's Vitals   06/26/22 0945 06/26/22 0948  BP: 122/64   Weight: 173 lb 3.2 oz (78.6 kg)   Height: 5\' 4"  (1.626 m)   PainSc:  6    Body mass index is 29.73 kg/m.     06/26/2022    9:59 AM 04/02/2022    8:42 AM 03/30/2022   10:32 AM 07/10/2021    6:37 AM 05/03/2021   12:34 PM 08/10/2020    8:39 AM 09/02/2019    1:29 PM  Advanced Directives  Does Patient Have a Medical Advance Directive? No No No No No No No  Would patient like information on creating a medical advance directive? No - Patient declined No - Patient declined  No - Patient declined No - Patient declined  No - Patient declined    Current Medications (verified) Outpatient Encounter Medications as of 06/26/2022  Medication Sig   albuterol (VENTOLIN HFA) 108 (90 Base) MCG/ACT inhaler Inhale 2 puffs into the lungs every 6 (six) hours as needed.   aspirin 81 MG EC tablet Take 1 tablet (81 mg total) by mouth daily.   Blood Glucose Monitoring Suppl (ONE TOUCH ULTRA 2) w/Device KIT Use to check blood sugar 4 times a day   Cholecalciferol 1.25 MG (50000 UT) TABS Take 1 tablet by mouth once a week.   clopidogrel (PLAVIX) 75 MG tablet Take 1 tablet (75 mg total) by mouth daily.   empagliflozin (JARDIANCE) 25 MG TABS tablet Take 1 tablet (25 mg total) by mouth daily.   EPINEPHRINE 0.3 mg/0.3 mL IJ SOAJ injection INJECT 1 SYRINGE INTO OUTER THIGH ONCE AS NEEDED FOR SEVERE ALLERGIC REACTION. (Patient taking differently: Inject 0.3 mg into the muscle as needed.)   fenofibrate 54 MG tablet Take 1 tablet (54 mg total) by mouth daily.   fluticasone furoate-vilanterol (BREO ELLIPTA) 100-25 MCG/ACT AEPB Inhale 1 puff into the lungs  daily.   gabapentin (NEURONTIN) 300 MG capsule Take 1 capsule (300 mg total) by mouth 2 (two) times daily.   glucose blood (ONETOUCH ULTRA) test strip TEST BLOOD SUGAR 5 TIMES A DAY   icosapent Ethyl (VASCEPA) 1 g capsule Take 1 capsule (1 g total) by mouth 2 (two) times daily.   Insulin Degludec (TRESIBA) 100 UNIT/ML SOLN Inject 40 Units into the skin 2 (two) times daily.   Insulin Disposable Pump (OMNIPOD DASH PDM, GEN 4,) KIT AS PRESCRIBED USE AS DIRECTED BASED ON TOTAL DAILY INSULIN AND POD CHANGE FREQUENCY   Insulin Pen Needle (PEN NEEDLES) 32G X 4 MM MISC 1 kit by Does not apply route daily.   Lancets (ONETOUCH DELICA PLUS LANCET33G) MISC USE AS DIRECTED.   lisinopril (ZESTRIL) 20 MG tablet Take 1 tablet (20 mg total) by mouth daily.   LYUMJEV KWIKPEN 100 UNIT/ML KwikPen Per endocrinology directions-SS -- inject into skin 3 times a day prior to meals   Misc. Devices (PULSE OXIMETER FOR FINGER) MISC To check O2 saturations once daily with asthma and document + check if any shortness of breath   montelukast (SINGULAIR) 10 MG tablet Take 1 tablet (10 mg total) by mouth at bedtime.   mupirocin ointment (BACTROBAN) 2 % Apply 1 Application topically 2 (two) times daily.   nitroGLYCERIN (NITROSTAT) 0.4  MG SL tablet Place 1 tablet (0.4 mg total) under the tongue every 5 (five) minutes as needed for chest pain.   omeprazole (PRILOSEC) 20 MG capsule Take 1 capsule (20 mg total) by mouth daily.   rosuvastatin (CRESTOR) 40 MG tablet Take 1 tablet (40 mg total) by mouth daily.   sitaGLIPtin (JANUVIA) 100 MG tablet Take 1 tablet (100 mg total) by mouth daily.   tiZANidine (ZANAFLEX) 2 MG tablet Take 1 tablet (2 mg total) by mouth every 6 (six) hours as needed for muscle spasms. (Patient taking differently: Take 2 mg by mouth every 6 (six) hours.)   triamcinolone cream (KENALOG) 0.1 % Apply 1 Application topically 2 (two) times daily. (Patient taking differently: Apply 1 Application topically 2 (two) times  daily. Arms and legs)   ULTRACARE PEN NEEDLES 32G X 5 MM MISC    [DISCONTINUED] BREO ELLIPTA 100-25 MCG/ACT AEPB INHALE 1 PUFF BY MOUTH ONCE DAILY (Patient taking differently: 1 puff as needed.)   [DISCONTINUED] Cholecalciferol 1.25 MG (50000 UT) TABS Take 1 tablet by mouth once a week. (Patient taking differently: Take 1 tablet by mouth once a week. Mondays)   [DISCONTINUED] clopidogrel (PLAVIX) 75 MG tablet Take 1 tablet by mouth daily.   [DISCONTINUED] doxycycline (VIBRA-TABS) 100 MG tablet Take 1 tablet (100 mg total) by mouth 2 (two) times daily for 7 days.   [DISCONTINUED] fenofibrate 54 MG tablet TAKE 1 TABLET BY MOUTH ONCE DAILY (Patient taking differently: Take 54 mg by mouth every morning.)   [DISCONTINUED] gabapentin (NEURONTIN) 300 MG capsule TAKE 1 CAPSULE BY MOUTH TWICE DAILY (Patient taking differently: Take 300 mg by mouth 2 (two) times daily.)   [DISCONTINUED] glucose blood (ONETOUCH ULTRA) test strip TEST BLOOD SUGAR 5 TIMES A DAY   [DISCONTINUED] ibuprofen (ADVIL) 800 MG tablet Take 1 tablet (800 mg total) by mouth every 8 (eight) hours as needed.   [DISCONTINUED] icosapent Ethyl (VASCEPA) 1 g capsule Take 1 g by mouth 2 (two) times daily.   [DISCONTINUED] Insulin Degludec (TRESIBA) 100 UNIT/ML SOLN Inject 40 Units into the skin 2 (two) times daily.   [DISCONTINUED] JARDIANCE 25 MG TABS tablet TAKE 1 TABLET BY MOUTH ONCE DAILY (Patient taking differently: 25 mg every morning.)   [DISCONTINUED] lisinopril (ZESTRIL) 20 MG tablet TAKE 1 TABLET BY MOUTH ONCE DAILY (Patient taking differently: Take 20 mg by mouth every morning.)   [DISCONTINUED] LYUMJEV KWIKPEN 100 UNIT/ML KwikPen Inject into the skin 3 (three) times daily before meals. Per endocrinology directions-SS   [DISCONTINUED] montelukast (SINGULAIR) 10 MG tablet TAKE 1 TABLET BY MOUTH AT BEDTIME (Patient taking differently: Take 10 mg by mouth at bedtime.)   [DISCONTINUED] nitroGLYCERIN (NITROSTAT) 0.4 MG SL tablet Place 1  tablet (0.4 mg total) under the tongue every 5 (five) minutes as needed for chest pain.   [DISCONTINUED] omeprazole (PRILOSEC) 20 MG capsule TAKE 1 CAPSULE BY MOUTH ONCE DAILY (Patient taking differently: 20 mg every morning.)   [DISCONTINUED] rosuvastatin (CRESTOR) 40 MG tablet Take 1 tablet (40 mg total) by mouth daily. (Patient taking differently: Take 40 mg by mouth at bedtime.)   [DISCONTINUED] sitaGLIPtin (JANUVIA) 100 MG tablet Take 1 tablet (100 mg total) by mouth daily. (Patient taking differently: Take 100 mg by mouth every morning.)   [DISCONTINUED] traMADol (ULTRAM) 50 MG tablet Take 50 mg by mouth every 6 (six) hours.   [DISCONTINUED] valACYclovir (VALTREX) 1000 MG tablet Take 500 mg by mouth 3 (three) times daily.   No facility-administered encounter medications on file as  of 06/26/2022.    Allergies (verified) Sulfa antibiotics, Bee venom, Morphine, and Other   History: Past Medical History:  Diagnosis Date   Arrhythmia    Asthma    CCC (chronic calculous cholecystitis)    COPD (chronic obstructive pulmonary disease)    Cyst of kidney, acquired    Diabetes mellitus without complication 2013   type 2   Diverticulosis    Edentulous    Fatty liver    Gallbladder polyp    GERD (gastroesophageal reflux disease)    Heart murmur    History of chicken pox    History of measles as a child    History of PKU    Hyperlipidemia    Hypertension    IBS (irritable bowel syndrome)    Irregular heart beat    Mentally challenged    Pancreatitis    PONV (postoperative nausea and vomiting)    Stroke 03/02/2019   vision issues - right eye/right sided weakness   Vision impairment    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   Past Surgical History:  Procedure Laterality Date   Cardiac Catherization     [facility]   CARDIAC CATHETERIZATION     [facility]   CATARACT EXTRACTION W/PHACO Left 06/05/2021   Procedure: CATARACT EXTRACTION PHACO AND INTRAOCULAR LENS PLACEMENT (IOC) LEFT DIABETIC 3.75  00:28.0;  Surgeon: Nevada Crane, MD;  Location: [facility];  Service: Ophthalmology;  Laterality: Left;  Diabetic   CATARACT EXTRACTION W/PHACO Right 07/10/2021   Procedure: CATARACT EXTRACTION PHACO AND INTRAOCULAR LENS PLACEMENT (IOC) RIGHT DIABETIC;  Surgeon: Nevada Crane, MD;  Location: [facility];  Service: Ophthalmology;  Laterality: Right;  Diabetic 2.66 00:24.4   COLONOSCOPY     COLONOSCOPY WITH PROPOFOL N/A 08/10/2020   Procedure: COLONOSCOPY WITH PROPOFOL;  Surgeon: Toney Reil, MD;  Location: [facility] ENDOSCOPY;  Service: Gastroenterology;  Laterality: N/A;  Has ankle monitor; (Parole officer "Ms Ward Givens needs a time before Monday [phone]" - per patient's wife)   ESOPHAGOGASTRODUODENOSCOPY (EGD) WITH PROPOFOL N/A 12/27/2016   Procedure: ESOPHAGOGASTRODUODENOSCOPY (EGD) WITH PROPOFOL;  Surgeon: Toney Reil, MD;  Location: [facility] ENDOSCOPY;  Service: Gastroenterology;  Laterality: N/A;   HEMORRHOID SURGERY     KNEE SURGERY     cyst removed   Ligament Removal Left    of left thumb: dr. Alberteen Spindle   ligament removal  of left thumb     Dr. Alberteen Spindle   NM GATED MYOCARDIAL STUDY (ARMX HX)  06/23/2014   Paraschos. Normal   WISDOM TOOTH EXTRACTION     Family History  Problem Relation Age of Onset   Cancer Mother        throat   Diabetes Father    Heart disease Father    Cancer Maternal Grandmother    Cancer Maternal Grandfather        pancreatic   Cancer Paternal Grandfather    Social History   Socioeconomic History   Marital status: Married    Spouse name: Not on file   Number of children: 0   Years of education: Not on file   Highest education level: 12th grade  Occupational History   Occupation: Disabled  Tobacco Use   Smoking status: Never    Passive exposure: Never   Smokeless tobacco: Never  Vaping Use   Vaping Use: Never used  Substance and Sexual Activity   Alcohol use: No    Alcohol/week: 0.0 standard drinks of alcohol    Drug use: No   Sexual  activity: Not Currently  Other Topics Concern   Not on file  Social History Narrative   ** Merged History Encounter **       ** Data from: 10/22/14 Enc Dept: [facility]       ** Data from: 10/07/14 Enc Dept: [facility]   Arrest: Arrested in 2012 for indecent liberties, required to wear ankle monitor   Social Determinants of Health   Financial Resource Strain: Low Risk  (06/26/2022)   Overall Financial Resource Strain (CARDIA)    Difficulty of Paying Living Expenses: Not very hard  Recent Concern: Financial Resource Strain - High Risk (06/25/2022)   Overall Financial Resource Strain (CARDIA)    Difficulty of Paying Living Expenses: Hard  Food Insecurity: No Food Insecurity (06/26/2022)   Hunger Vital Sign    Worried About Running Out of Food in the Last Year: Never true    Ran Out of Food in the Last Year: Never true  Transportation Needs: No Transportation Needs (06/26/2022)   PRAPARE - Administrator, Civil Service (Medical): No    Lack of Transportation (Non-Medical): No  Physical Activity: Inactive (06/26/2022)   Exercise Vital Sign    Days of Exercise per Week: 0 days    Minutes of Exercise per Session: 0 min  Stress: No Stress Concern Present (06/26/2022)   [employer] of Occupational Health - Occupational Stress Questionnaire    Feeling of Stress : Only a little  Recent Concern: Stress - Stress Concern Present (06/25/2022)   [employer] of Occupational Health - Occupational Stress Questionnaire    Feeling of Stress : To some extent  Social Connections: Socially Isolated (06/26/2022)   Social Connection and Isolation Panel [NHANES]    Frequency of Communication with Friends and Family: Once a week    Frequency of Social Gatherings with Friends and Family: Once a week    Attends Religious Services: Never    Database administrator or Organizations: No    Attends Engineer, structural: Never    Marital Status:  Married    Tobacco Counseling Counseling given: Not Answered   Clinical Intake:  Pre-visit preparation completed: Yes  Pain : 0-10 Pain Score: 6  Pain Type: Chronic pain Pain Location: Knee Pain Orientation: Right     Nutritional Risks: None Diabetes: Yes CBG done?: No Did pt. bring in CBG monitor from home?: No  How often do you need to have someone help you when you read instructions, pamphlets, or other written materials from your doctor or pharmacy?: 1 - Never  Diabetic?yes Nutrition Risk Assessment:  Has the patient had any N/V/D within the last 2 months?  Yes  Does the patient have any non-healing wounds?  No  Has the patient had any unintentional weight loss or weight gain?  No   Diabetes:  Is the patient diabetic?  Yes  If diabetic, was a CBG obtained today?  No  Did the patient bring in their glucometer from home?  No  How often do you monitor your CBG's? 3x/day.   Financial Strains and Diabetes Management:  Are you having any financial strains with the device, your supplies or your medication? No .  Does the patient want to be seen by Chronic Care Management for management of their diabetes?  No  Would the patient like to be referred to a Nutritionist or for Diabetic Management?  No   Diabetic Exams:  Diabetic Eye Exam: Completed 05/25/21. Overdue for diabetic eye exam.  Pt has been advised about the importance in completing this exam.  Diabetic Foot Exam: Completed 06/25/22. Pt has been advised about the importance in completing this exam.   Interpreter Needed?: No  Information entered by :: Kennedy Bucker, LPN   Activities of Daily Living    06/26/2022   10:01 AM 06/25/2022    5:16 PM  In your present state of health, do you have any difficulty performing the following activities:  Hearing? 0 0  Vision? 0 0  Difficulty concentrating or making decisions? 1 1  Walking or climbing stairs? 0 0  Dressing or bathing? 0 0  Doing errands, shopping? 0 0   Preparing Food and eating ? N   Using the Toilet? N   In the past six months, have you accidently leaked urine? N   Do you have problems with loss of bowel control? N   Managing your Medications? N   Managing your Finances? N   Housekeeping or managing your Housekeeping? Y     Patient Care Team: Marjie Skiff, NP as PCP - General (Nurse Practitioner) Isla Pence, OD (Optometry) Laurier Nancy, MD as Consulting Physician (Cardiology) Zettie Pho, [facility] (Pharmacist) Marlowe Sax, RN as Case Manager (General Practice)  Indicate any recent Medical Services you may have received from other than [facility] providers in the past year (date may be approximate).     Assessment:   This is a routine wellness examination for [place].  Hearing/Vision screen Hearing Screening - Comments:: No aids Vision Screening - Comments:: Readers- Dr.Woodard  Dietary issues and exercise activities discussed: Current Exercise Habits: The patient does not participate in regular exercise at present   Goals Addressed             This Visit's Progress    DIET - EAT MORE FRUITS AND VEGETABLES         Depression Screen    06/26/2022    9:56 AM 06/25/2022    5:18 PM 12/25/2021    5:05 PM 08/01/2021    1:23 PM 07/26/2021    3:16 PM 05/24/2021    3:54 PM 05/03/2021   12:17 PM  PHQ 2/9 Scores  PHQ - 2 Score 0 0 0 3 0 3 1  PHQ- 9 Score 0 0  Fall Risk    06/26/2022   10:00 AM 06/25/2022    5:16 PM 04/17/2022    1:14 PM 03/29/2022   10:17 AM 02/07/2022    9:20 AM  Fall Risk   Falls in the past year? 0 0 0 0 1  Number falls in past yr: 0 0  0 1  Injury with Fall? 0 0  0 1  Risk for fall due to : No Fall Risks No Fall Risks   History of fall(s);Other (Comment)  Risk for fall due to: Comment     blood sugars dropped and has had 2 falls recently due to blood sugars  Follow up Falls prevention discussed Falls prevention discussed   Falls evaluation completed;Education provided;Falls  prevention discussed    FALL RISK PREVENTION PERTAINING TO THE HOME:  Any stairs in or around the home? No  If so, are there any without handrails? No  Home free of loose throw rugs in walkways, pet beds, electrical cords, etc? Yes  Adequate lighting in your home to reduce risk of falls? Yes   ASSISTIVE DEVICES UTILIZED TO PREVENT FALLS:  Life alert? No  Use of  a cane, walker or w/c? No  Grab bars in the bathroom? Yes  Shower chair or bench in shower? No  Elevated toilet seat or a handicapped toilet? No   TIMED UP AND GO:  Was the test performed? Yes .  Length of time to ambulate 10 feet: 6 sec.   Gait slow and steady without use of assistive device  Cognitive Function:        06/25/2022    5:18 PM 05/03/2021   12:14 PM 05/15/2016    1:39 PM  6CIT Screen  What Year? 0 points 0 points 0 points  What month? 0 points 0 points 3 points  What time? 0 points 0 points 3 points  Count back from 20 2 points 0 points 0 points  Months in reverse 2 points 0 points 0 points  Repeat phrase 0 points 0 points 10 points  Total Score 4 points 0 points 16 points    Immunizations Immunization History  Administered Date(s) Administered   Hepatitis A, Adult 09/26/2016, 05/22/2017   Hepatitis B, ADULT 09/26/2016, 05/22/2017, 11/29/2017   Influenza,inj,Quad PF,6+ Mos 12/22/2012, 12/21/2013, 11/29/2017, 12/02/2018, 01/18/2021, 03/23/2022   Influenza-Unspecified 11/30/2014, 11/18/2015   Moderna Sars-Covid-2 Vaccination 11/16/2019, 12/14/2019, 12/14/2019   Pneumococcal Polysaccharide-23 12/22/2012, 11/18/2015   Td 01/18/2021    TDAP status: Up to date  Flu Vaccine status: Up to date  Pneumococcal vaccine status: Up to date  Covid-19 vaccine status: Completed vaccines  Qualifies for Shingles Vaccine? Yes   Zostavax completed No   Shingrix Completed?: No.    Education has been provided regarding the importance of this vaccine. Patient has been advised to call insurance company to  determine out of pocket expense if they have not yet received this vaccine. Advised may also receive vaccine at local pharmacy or Health Dept. Verbalized acceptance and understanding.  Screening Tests Health Maintenance  Topic Date Due   OPHTHALMOLOGY EXAM  05/26/2022   COVID-19 Vaccine (4 - 2023-24 season) 07/11/2022 (Originally 11/17/2021)   Zoster Vaccines- Shingrix (1 of 2) 09/24/2022 (Originally 02/09/1980)   INFLUENZA VACCINE  10/18/2022   HEMOGLOBIN A1C  12/25/2022   Diabetic kidney evaluation - Urine ACR  03/24/2023   Diabetic kidney evaluation - eGFR measurement  06/25/2023   FOOT EXAM  06/25/2023   Medicare Annual Wellness (AWV)  06/26/2023   COLONOSCOPY (Pts [phone] Insurance coverage will need to be confirmed)  08/11/2027   DTaP/Tdap/Td (2 - Tdap) 01/19/2031   Hepatitis C Screening  Completed   HIV Screening  Completed   HPV VACCINES  Aged Out   Fecal DNA (Cologuard)  Discontinued    Health Maintenance  Health Maintenance Due  Topic Date Due   OPHTHALMOLOGY EXAM  05/26/2022    Colorectal cancer screening: Type of screening: Colonoscopy. Completed 08/10/20. Repeat every 7 years  Lung Cancer Screening: (Low Dose CT Chest recommended if Age 48-80 years, 30 pack-year currently smoking OR have quit w/in 15years.) does not qualify.   Additional Screening:  Hepatitis C Screening: does qualify; Completed 05/12/15  Vision Screening: Recommended annual ophthalmology exams for early detection of glaucoma and other disorders of the eye. Is the patient up to date with their annual eye exam?  Yes  Who is the provider or what is the name of the office in which the patient attends annual eye exams? Dr.Woodard If pt is not established with a provider, would they like to be referred to a provider to establish care? No .   Dental Screening: Recommended annual dental exams  for proper oral hygiene  Community Resource Referral / Chronic Care Management: CRR required this visit?  No    CCM required this visit?  No      Plan:     I have personally reviewed and noted the following in the patient's chart:   Medical and social history Use of alcohol, tobacco or illicit drugs  Current medications and supplements including opioid prescriptions. Patient is not currently taking opioid prescriptions. Functional ability and status Nutritional status Physical activity Advanced directives List of other physicians Hospitalizations, surgeries, and ER visits in previous 12 months Vitals Screenings to include cognitive, depression, and falls Referrals and appointments  In addition, I have reviewed and discussed with patient certain preventive protocols, quality metrics, and best practice recommendations. A written personalized care plan for preventive services as well as general preventive health recommendations were provided to patient.     Hal Hope, LPN   6/0/4540   Nurse Notes: none

## 2022-06-28 DIAGNOSIS — E7849 Other hyperlipidemia: Secondary | ICD-10-CM | POA: Diagnosis not present

## 2022-06-28 DIAGNOSIS — N183 Chronic kidney disease, stage 3 unspecified: Secondary | ICD-10-CM | POA: Diagnosis not present

## 2022-06-28 DIAGNOSIS — K219 Gastro-esophageal reflux disease without esophagitis: Secondary | ICD-10-CM | POA: Diagnosis not present

## 2022-06-28 DIAGNOSIS — I10 Essential (primary) hypertension: Secondary | ICD-10-CM | POA: Diagnosis not present

## 2022-06-28 DIAGNOSIS — E1165 Type 2 diabetes mellitus with hyperglycemia: Secondary | ICD-10-CM | POA: Diagnosis not present

## 2022-07-05 ENCOUNTER — Telehealth: Payer: Medicare Other

## 2022-07-13 ENCOUNTER — Other Ambulatory Visit: Payer: Medicare Other

## 2022-07-13 DIAGNOSIS — E1169 Type 2 diabetes mellitus with other specified complication: Secondary | ICD-10-CM | POA: Diagnosis not present

## 2022-07-13 DIAGNOSIS — E785 Hyperlipidemia, unspecified: Secondary | ICD-10-CM | POA: Diagnosis not present

## 2022-07-14 LAB — LIPID PANEL W/O CHOL/HDL RATIO
Cholesterol, Total: 121 mg/dL (ref 100–199)
HDL: 36 mg/dL — ABNORMAL LOW (ref >39)
LDL Chol Calc (NIH): 61 mg/dL (ref 0–99)
Triglycerides: 136 mg/dL (ref 0–149)
VLDL Cholesterol Cal: 24 mg/dL (ref 5–40)

## 2022-07-15 NOTE — Progress Notes (Signed)
Good morning, please let Leo and [name] know cholesterol labs are [facility] better with him fasting and within normal ranges.  No medication changes needed.  Continue all current medications.  Any questions? Keep being amazing!!  Thank you for allowing me to participate in your care.  I appreciate you. Kindest regards, Treyveon Mochizuki

## 2022-07-18 DIAGNOSIS — E7849 Other hyperlipidemia: Secondary | ICD-10-CM | POA: Diagnosis not present

## 2022-07-18 DIAGNOSIS — I10 Essential (primary) hypertension: Secondary | ICD-10-CM | POA: Diagnosis not present

## 2022-07-18 DIAGNOSIS — E1165 Type 2 diabetes mellitus with hyperglycemia: Secondary | ICD-10-CM | POA: Diagnosis not present

## 2022-07-18 DIAGNOSIS — N183 Chronic kidney disease, stage 3 unspecified: Secondary | ICD-10-CM | POA: Diagnosis not present

## 2022-07-25 DIAGNOSIS — N183 Chronic kidney disease, stage 3 unspecified: Secondary | ICD-10-CM | POA: Diagnosis not present

## 2022-07-25 DIAGNOSIS — E1165 Type 2 diabetes mellitus with hyperglycemia: Secondary | ICD-10-CM | POA: Diagnosis not present

## 2022-07-25 DIAGNOSIS — K219 Gastro-esophageal reflux disease without esophagitis: Secondary | ICD-10-CM | POA: Diagnosis not present

## 2022-07-25 DIAGNOSIS — I10 Essential (primary) hypertension: Secondary | ICD-10-CM | POA: Diagnosis not present

## 2022-07-25 DIAGNOSIS — G459 Transient cerebral ischemic attack, unspecified: Secondary | ICD-10-CM | POA: Diagnosis not present

## 2022-07-25 DIAGNOSIS — E7849 Other hyperlipidemia: Secondary | ICD-10-CM | POA: Diagnosis not present

## 2022-08-10 ENCOUNTER — Ambulatory Visit: Payer: Self-pay

## 2022-08-10 ENCOUNTER — Ambulatory Visit (INDEPENDENT_AMBULATORY_CARE_PROVIDER_SITE_OTHER): Payer: Medicare Other | Admitting: Physician Assistant

## 2022-08-10 ENCOUNTER — Encounter: Payer: Self-pay | Admitting: Physician Assistant

## 2022-08-10 VITALS — BP 108/66 | HR 67 | Temp 98.8°F | Wt 172.8 lb

## 2022-08-10 DIAGNOSIS — S80861A Insect bite (nonvenomous), right lower leg, initial encounter: Secondary | ICD-10-CM

## 2022-08-10 DIAGNOSIS — W57XXXA Bitten or stung by nonvenomous insect and other nonvenomous arthropods, initial encounter: Secondary | ICD-10-CM

## 2022-08-10 MED ORDER — CEPHALEXIN 500 MG PO CAPS: 500.0000 mg | ORAL_CAPSULE | Freq: Four times a day (QID) | ORAL | 0 refills | Status: AC

## 2022-08-10 NOTE — Telephone Encounter (Signed)
  Chief Complaint: spider bite Symptoms: spider bite to RLE under calf, reddened area, pain swelling, yellow area in center that has turned black Frequency: yesterday Pertinent Negatives: NA Disposition: [] ED /[] Urgent Care (no appt availability in office) / [x] Appointment(In office/virtual)/ []  [place] [facility]/ [] Home Care/ [] Refused Recommended Disposition /[] [place] [facility]/ []  Follow-up with PCP Additional Notes: pt's wife calling to report pt gotten bite by spider, unsure what kind. Has been putting a cream on it but not helping. Scheduled OV today at 1340 with Erin, PA.   Reason for Disposition  [1] Red or very tender (to touch) area AND [2] started over 24 hours after the bite  Answer Assessment - Initial Assessment Questions 1. TYPE of SPIDER: "What type of spider was it?"  (e.g., name, unknown, or brief description)     Unsure  2. LOCATION: "Where is the bite located?"      R leg, lower under calf  3. PAIN: "Is there any pain?" If Yes, ask: "How bad is it?"  (Scale 1-10; or mild, moderate, severe)    - NONE (0): no pain    - MILD (1-3): doesn't interfere with normal activities     - MODERATE (4-7): interferes with normal activities or awakens from sleep     - SEVERE (8-10): excruciating pain, unable to do any normal activities     yes 4. SWELLING: "How big is the swelling?" (Inches, cm or compare to coins)      yes 5. ONSET: "When did the bite occur?" (Minutes or hours ago)      yesterday 7. OTHER SYMPTOMS: "Do you have any other symptoms?"  (e.g., muscle cramps, abdomen pain, change in urine color)     In middle of bite of had yellow area and now turning black  Protocols used: Spider Bite - [employer]

## 2022-08-10 NOTE — Patient Instructions (Addendum)
For your insect bite I recommend the following:  Please stop touching and squeezing the area  You can keep it clean with gentle soap and warm water  Do not use rubbing alcohol or peroxide for cleaning the area. This will generally irritate it more   You can use Benadryl cream to help with the itching You can take a daily antihistamine such as Claritin, Allegra or Zyrtec to help reduce the reaction. The generics of these work just fine  You can use Tylenol and Ibuprofen for pain management as needed  I have sent in an antibiotic for you to take. This is Keflex and will need to be taken by mouth every 6 hours for 7 days. Please stay well hydrated and take the medication with food to prevent your stomach getting upset  If the area gets bigger, changes color, gets more red, starts to hurt more, has drainage or pus leaking from it please let us know. Please seek prompt care if you start to have fevers, trouble walking, leg swelling or black discoloration in the area.

## 2022-08-10 NOTE — Progress Notes (Signed)
Acute Office Visit   Patient: [name].   DOB: 04-24-1960   62 y.o. Male  MRN: [id number] Visit Date: 08/10/2022  Today's healthcare provider: Oswaldo Conroy Neldon Shepard, PA-C  Introduced myself to the patient as a Secondary school teacher and provided education on APPs in clinical practice.    Chief Complaint  Patient presents with   Insect Bite    Pt states he was bit by a spider on his R lower leg yesterday while mowing. States it is very painful and red around the site.    Subjective    HPI HPI     Insect Bite    Additional comments: Pt states he was bit by a spider on his R lower leg yesterday while mowing. States it is very painful and red around the site.       Last edited by Pablo Ledger, CMA on 08/10/2022  1:58 PM.      Bug Bite  Duration: occurred yesterday while he was cutting grass  Type of insect: he did not see the insect  Location: right lower leg  Associated symptoms: redness and swelling, tenderness along calf  Interventions: Peroxide, a cream but he's not sure what kind  He reports some itching and states pain is 11/10  He states it is affecting his knee and hip and hurts to walk    Medications: Outpatient Medications Prior to Visit  Medication Sig   albuterol (VENTOLIN HFA) 108 (90 Base) MCG/ACT inhaler Inhale 2 puffs into the lungs every 6 (six) hours as needed.   aspirin 81 MG EC tablet Take 1 tablet (81 mg total) by mouth daily.   Blood Glucose Monitoring Suppl (ONE TOUCH ULTRA 2) w/Device KIT Use to check blood sugar 4 times a day   Cholecalciferol 1.25 MG (50000 UT) TABS Take 1 tablet by mouth once a week.   clopidogrel (PLAVIX) 75 MG tablet Take 1 tablet (75 mg total) by mouth daily.   empagliflozin (JARDIANCE) 25 MG TABS tablet Take 1 tablet (25 mg total) by mouth daily.   EPINEPHRINE 0.3 mg/0.3 mL IJ SOAJ injection INJECT 1 SYRINGE INTO OUTER THIGH ONCE AS NEEDED FOR SEVERE ALLERGIC REACTION. (Patient taking differently: Inject 0.3 mg into the  muscle as needed.)   fenofibrate 54 MG tablet Take 1 tablet (54 mg total) by mouth daily.   fluticasone furoate-vilanterol (BREO ELLIPTA) 100-25 MCG/ACT AEPB Inhale 1 puff into the lungs daily.   gabapentin (NEURONTIN) 300 MG capsule Take 1 capsule (300 mg total) by mouth 2 (two) times daily.   glucose blood (ONETOUCH ULTRA) test strip TEST BLOOD SUGAR 5 TIMES A DAY   icosapent Ethyl (VASCEPA) 1 g capsule Take 1 capsule (1 g total) by mouth 2 (two) times daily.   Insulin Degludec (TRESIBA) 100 UNIT/ML SOLN Inject 40 Units into the skin 2 (two) times daily.   Insulin Disposable Pump (OMNIPOD DASH PDM, GEN 4,) KIT AS PRESCRIBED USE AS DIRECTED BASED ON TOTAL DAILY INSULIN AND POD CHANGE FREQUENCY   Insulin Pen Needle (PEN NEEDLES) 32G X 4 MM MISC 1 kit by Does not apply route daily.   Lancets (ONETOUCH DELICA PLUS LANCET33G) MISC USE AS DIRECTED.   lisinopril (ZESTRIL) 20 MG tablet Take 1 tablet (20 mg total) by mouth daily.   LYUMJEV KWIKPEN 100 UNIT/ML KwikPen Per endocrinology directions-SS -- inject into skin 3 times a day prior to meals   Misc. Devices (PULSE OXIMETER FOR FINGER) MISC To check  O2 saturations once daily with asthma and document + check if any shortness of breath   montelukast (SINGULAIR) 10 MG tablet Take 1 tablet (10 mg total) by mouth at bedtime.   mupirocin ointment (BACTROBAN) 2 % Apply 1 Application topically 2 (two) times daily.   nitroGLYCERIN (NITROSTAT) 0.4 MG SL tablet Place 1 tablet (0.4 mg total) under the tongue every 5 (five) minutes as needed for chest pain.   omeprazole (PRILOSEC) 20 MG capsule Take 1 capsule (20 mg total) by mouth daily.   rosuvastatin (CRESTOR) 40 MG tablet Take 1 tablet (40 mg total) by mouth daily.   sitaGLIPtin (JANUVIA) 100 MG tablet Take 1 tablet (100 mg total) by mouth daily.   tiZANidine (ZANAFLEX) 2 MG tablet Take 1 tablet (2 mg total) by mouth every 6 (six) hours as needed for muscle spasms. (Patient taking differently: Take 2 mg by  mouth every 6 (six) hours.)   triamcinolone cream (KENALOG) 0.1 % Apply 1 Application topically 2 (two) times daily. (Patient taking differently: Apply 1 Application topically 2 (two) times daily. Arms and legs)   ULTRACARE PEN NEEDLES 32G X 5 MM MISC    No facility-administered medications prior to visit.    Review of Systems  Skin:        Insect bite        Objective    BP 108/66   Pulse 67   Temp 98.8 F (37.1 C) (Oral)   Wt 172 lb 12.8 oz (78.4 kg)   SpO2 94%   BMI 29.66 kg/m    Physical Exam Vitals reviewed.  Constitutional:      Appearance: Normal appearance.  HENT:     Head: Normocephalic and atraumatic.  Pulmonary:     Effort: Pulmonary effort is normal.  Musculoskeletal:     Cervical back: Normal range of motion.  Skin:    General: Skin is warm and dry.     Findings: Abscess and erythema present.     Comments: Approx 3 cm abscess of right calf with central punctate and surrounding erythema as pictured below   Neurological:     Mental Status: He is alert.      Media Information   Document Information  Photos    08/10/2022 14:21  Attached To:  Office Visit on 08/10/22 with Yajaira Doffing, Oswaldo Conroy, PA-C  Source Information  Nilay Mangrum, Mirian Mo  [facility]     Media Information   Document Information  Photos    08/10/2022 14:21  Attached To:  Office Visit on 08/10/22 with Seraiah Nowack, Oswaldo Conroy, PA-C  Source Information  Shasha Buchbinder, Oswaldo Conroy, PA-C  [facility]     No results found for any visits on 08/10/22.  Assessment & Plan      No follow-ups on file.       Problem List Items Addressed This Visit   None Visit Diagnoses     Insect bite of right lower leg, initial encounter    -  Primary Acute, new concern Patient reports redness and intense pain at site since injury occurred yesterday He reports using a prescription cream (?) and  peroxide without much improvement Unsure of cause of abscess but reviewed care measures with  him during apt and provided on AVS- warm compresses with warm wet washcloth, avoiding touching the area or scratching it, using benadryl cream and taking OTC antihistamine to assist with potential reaction, Tylenol for pain relief  Will send in script for Keflex as I am concerned  for abscess and secondary infection - particularly since patient was instructed multiple times during apt to stop touching and manipulating the area  I am skeptical of patient's ability/compliance  to follow aftercare directions for I&D  so this was not attempted today  Most recent Tdap booster was 2022 so he is UTD Reviewed return and ED precautions and provided these on AVS as well.  Follow up as needed for persistent or progressing symptoms     Relevant Medications   cephALEXin (KEFLEX) 500 MG capsule        No follow-ups on file.   I, Jazlin Tapscott E Malijah Lietz, PA-C, have reviewed all documentation for this visit. The documentation on 08/10/22 for the exam, diagnosis, procedures, and orders are all accurate and complete.   Jacquelin Hawking, MHS, PA-C [facility]

## 2022-08-15 DIAGNOSIS — H04123 Dry eye syndrome of bilateral lacrimal glands: Secondary | ICD-10-CM | POA: Diagnosis not present

## 2022-08-15 DIAGNOSIS — H1789 Other corneal scars and opacities: Secondary | ICD-10-CM | POA: Diagnosis not present

## 2022-08-15 DIAGNOSIS — Z961 Presence of intraocular lens: Secondary | ICD-10-CM | POA: Diagnosis not present

## 2022-08-15 DIAGNOSIS — E113293 Type 2 diabetes mellitus with mild nonproliferative diabetic retinopathy without macular edema, bilateral: Secondary | ICD-10-CM | POA: Diagnosis not present

## 2022-08-15 LAB — HM DIABETES EYE EXAM

## 2022-09-15 ENCOUNTER — Other Ambulatory Visit: Payer: Self-pay | Admitting: Nurse Practitioner

## 2022-09-17 NOTE — Telephone Encounter (Signed)
Requested Prescriptions  Pending Prescriptions Disp Refills   OneTouch Delica Lancets 33G MISC [Pharmacy Med Name: ONETOUCH DELICA LANCETS 33G] 300 each 3    Sig: USE AS DIRECTED     Endocrinology: Diabetes - Testing Supplies Passed - 09/15/2022 12:09 PM      Passed - Valid encounter within last 12 months    Recent Outpatient Visits           1 month ago Insect bite of right lower leg, initial encounter   [place] [facility] [place], Oswaldo Conroy, PA-C   2 months ago Medicare annual wellness visit, subsequent   [place] [facility] [place], [place] T, NP   3 months ago Cellulitis of other specified site   [place] [facility] [place], [place] T, NP   3 months ago Uncontrolled type 2 diabetes mellitus with hyperglycemia (HCC)   [place] [facility] [place], [place] T, NP   5 months ago Type 2 diabetes mellitus with proteinuria (HCC)   [place] [facility] [place], Dorie Rank, NP       Future Appointments             In 2 weeks [place], Dorie Rank, NP [facility]

## 2022-09-24 ENCOUNTER — Ambulatory Visit: Payer: Medicare Other | Admitting: Nurse Practitioner

## 2022-09-24 ENCOUNTER — Ambulatory Visit (INDEPENDENT_AMBULATORY_CARE_PROVIDER_SITE_OTHER): Payer: Medicare Other | Admitting: Vascular Surgery

## 2022-09-24 ENCOUNTER — Encounter (INDEPENDENT_AMBULATORY_CARE_PROVIDER_SITE_OTHER): Payer: Medicare Other

## 2022-09-26 ENCOUNTER — Other Ambulatory Visit (INDEPENDENT_AMBULATORY_CARE_PROVIDER_SITE_OTHER): Payer: Self-pay | Admitting: Nurse Practitioner

## 2022-09-26 DIAGNOSIS — I6523 Occlusion and stenosis of bilateral carotid arteries: Secondary | ICD-10-CM

## 2022-09-29 NOTE — Patient Instructions (Signed)
Be Involved in Caring For Your Health:  Taking Medications When medications are taken as directed, they can greatly improve your health. But if they are not taken as prescribed, they may not work. In some cases, not taking them correctly can be harmful. To help ensure your treatment remains effective and safe, understand your medications and how to take them. Bring your medications to each visit for review by your provider.  Your lab results, notes, and after visit summary will be available on My Chart. We strongly encourage you to use this feature. If lab results are abnormal the clinic will contact you with the appropriate steps. If the clinic does not contact you assume the results are satisfactory. You can always view your results on My Chart. If you have questions regarding your health or results, please contact the clinic during office hours. You can also ask questions on My Chart.  We at [facility] are grateful that you chose us to provide your care. We strive to provide evidence-based and compassionate care and are always looking for feedback. If you get a survey from the clinic please complete this so we can hear your opinions.  Diabetes Mellitus Basics  Diabetes mellitus, or diabetes, is a long-term (chronic) disease. It occurs when the body does not properly use sugar (glucose) that is released from food after you eat. Diabetes mellitus may be caused by one or both of these problems: Your pancreas does not make enough of a hormone called insulin. Your body does not react in a normal way to the insulin that it makes. Insulin lets glucose enter cells in your body. This gives you energy. If you have diabetes, glucose cannot get into cells. This causes high blood glucose (hyperglycemia). How to treat and manage diabetes You may need to take insulin or other diabetes medicines daily to keep your glucose in balance. If you are prescribed insulin, you will learn how to give  yourself insulin by injection. You may need to adjust the amount of insulin you take based on the foods that you eat. You will need to check your blood glucose levels using a glucose monitor as told by your health care provider. The readings can help determine if you have low or high blood glucose. Generally, you should have these blood glucose levels: Before meals (preprandial): 80-130 mg/dL (4.4-7.2 mmol/L). After meals (postprandial): below 180 mg/dL (10 mmol/L). Hemoglobin A1c (HbA1c) level: less than 7%. Your health care provider will set treatment goals for you. Keep all follow-up visits. This is important. Follow these instructions at home: Diabetes medicines Take your diabetes medicines every day as told by your health care provider. List your diabetes medicines here: Name of medicine: ______________________________ Amount (dose): _______________ Time (a.m./p.m.): _______________ Notes: ___________________________________ Name of medicine: ______________________________ Amount (dose): _______________ Time (a.m./p.m.): _______________ Notes: ___________________________________ Name of medicine: ______________________________ Amount (dose): _______________ Time (a.m./p.m.): _______________ Notes: ___________________________________ Insulin If you use insulin, list the types of insulin you use here: Insulin type: ______________________________ Amount (dose): _______________ Time (a.m./p.m.): _______________Notes: ___________________________________ Insulin type: ______________________________ Amount (dose): _______________ Time (a.m./p.m.): _______________ Notes: ___________________________________ Insulin type: ______________________________ Amount (dose): _______________ Time (a.m./p.m.): _______________ Notes: ___________________________________ Insulin type: ______________________________ Amount (dose): _______________ Time (a.m./p.m.): _______________ Notes:  ___________________________________ Insulin type: ______________________________ Amount (dose): _______________ Time (a.m./p.m.): _______________ Notes: ___________________________________ Managing blood glucose  Check your blood glucose levels using a glucose monitor as told by your health care provider. Write down the times that you check your glucose levels here: Time: _______________ Notes: ___________________________________   Time: _______________ Notes: ___________________________________ Time: _______________ Notes: ___________________________________ Time: _______________ Notes: ___________________________________ Time: _______________ Notes: ___________________________________ Time: _______________ Notes: ___________________________________  Low blood glucose Low blood glucose (hypoglycemia) is when glucose is at or below 70 mg/dL (3.9 mmol/L). Symptoms may include: Feeling: Hungry. Sweaty and clammy. Irritable or easily upset. Dizzy. Sleepy. Having: A fast heartbeat. A headache. A change in your vision. Numbness around the mouth, lips, or tongue. Having trouble with: Moving (coordination). Sleeping. Treating low blood glucose To treat low blood glucose, eat or drink something containing sugar right away. If you can think clearly and swallow safely, follow the 15:15 rule: Take 15 grams of a fast-acting carb (carbohydrate), as told by your health care provider. Some fast-acting carbs are: Glucose tablets: take 3-4 tablets. Hard candy: eat 3-5 pieces. Fruit juice: drink 4 oz (120 mL). Regular (not diet) soda: drink 4-6 oz (120-180 mL). Honey or sugar: eat 1 Tbsp (15 mL). Check your blood glucose levels 15 minutes after you take the carb. If your glucose is still at or below 70 mg/dL (3.9 mmol/L), take 15 grams of a carb again. If your glucose does not go above 70 mg/dL (3.9 mmol/L) after 3 tries, get help right away. After your glucose goes back to normal, eat a meal  or a snack within 1 hour. Treating very low blood glucose If your glucose is at or below 54 mg/dL (3 mmol/L), you have very low blood glucose (severe hypoglycemia). This is an emergency. Do not wait to see if the symptoms will go away. Get medical help right away. Call your local emergency services (911 in the [place] Do not drive yourself to the hospital. Questions to ask your health care provider Should I talk with a diabetes educator? What equipment will I need to care for myself at home? What diabetes medicines do I need? When should I take them? How often do I need to check my blood glucose levels? What number can I call if I have questions? When is my follow-up visit? Where can I find a support group for people with diabetes? Where to find more information American Diabetes Association: www.diabetes.org Association of Diabetes Care and Education Specialists: www.diabeteseducator.org Contact a health care provider if: Your blood glucose is at or above 240 mg/dL (13.3 mmol/L) for 2 days in a row. You have been sick or have had a fever for 2 days or more, and you are not getting better. You have any of these problems for more than 6 hours: You cannot eat or drink. You feel nauseous. You vomit. You have diarrhea. Get help right away if: Your blood glucose is lower than 54 mg/dL (3 mmol/L). You get confused. You have trouble thinking clearly. You have trouble breathing. These symptoms may represent a serious problem that is an emergency. Do not wait to see if the symptoms will go away. Get medical help right away. Call your local emergency services (911 in the [place] Do not drive yourself to the hospital. Summary Diabetes mellitus is a chronic disease that occurs when the body does not properly use sugar (glucose) that is released from food after you eat. Take insulin and diabetes medicines as told. Check your blood glucose every day, as often as told. Keep all follow-up visits. This  is important. This information is not intended to replace advice given to you by your health care provider. Make sure you discuss any questions you have with your health care provider. Document Revised: 07/07/2019 Document Reviewed: 07/07/2019 Elsevier Patient Education    2024 Elsevier Inc.  

## 2022-10-01 ENCOUNTER — Ambulatory Visit (INDEPENDENT_AMBULATORY_CARE_PROVIDER_SITE_OTHER): Payer: Medicare Other | Admitting: Nurse Practitioner

## 2022-10-01 ENCOUNTER — Encounter: Payer: Self-pay | Admitting: Nurse Practitioner

## 2022-10-01 VITALS — BP 102/65 | HR 62 | Temp 97.8°F | Ht 64.02 in | Wt 171.2 lb

## 2022-10-01 DIAGNOSIS — J4540 Moderate persistent asthma, uncomplicated: Secondary | ICD-10-CM | POA: Diagnosis not present

## 2022-10-01 DIAGNOSIS — E1159 Type 2 diabetes mellitus with other circulatory complications: Secondary | ICD-10-CM | POA: Diagnosis not present

## 2022-10-01 DIAGNOSIS — E1169 Type 2 diabetes mellitus with other specified complication: Secondary | ICD-10-CM | POA: Diagnosis not present

## 2022-10-01 DIAGNOSIS — K760 Fatty (change of) liver, not elsewhere classified: Secondary | ICD-10-CM

## 2022-10-01 DIAGNOSIS — E559 Vitamin D deficiency, unspecified: Secondary | ICD-10-CM | POA: Diagnosis not present

## 2022-10-01 DIAGNOSIS — E1165 Type 2 diabetes mellitus with hyperglycemia: Secondary | ICD-10-CM

## 2022-10-01 DIAGNOSIS — E538 Deficiency of other specified B group vitamins: Secondary | ICD-10-CM

## 2022-10-01 DIAGNOSIS — Z8673 Personal history of transient ischemic attack (TIA), and cerebral infarction without residual deficits: Secondary | ICD-10-CM | POA: Diagnosis not present

## 2022-10-01 DIAGNOSIS — I152 Hypertension secondary to endocrine disorders: Secondary | ICD-10-CM

## 2022-10-01 DIAGNOSIS — G5602 Carpal tunnel syndrome, left upper limb: Secondary | ICD-10-CM | POA: Diagnosis not present

## 2022-10-01 DIAGNOSIS — E701 Other hyperphenylalaninemias: Secondary | ICD-10-CM | POA: Diagnosis not present

## 2022-10-01 DIAGNOSIS — R809 Proteinuria, unspecified: Secondary | ICD-10-CM

## 2022-10-01 DIAGNOSIS — R011 Cardiac murmur, unspecified: Secondary | ICD-10-CM

## 2022-10-01 DIAGNOSIS — E6609 Other obesity due to excess calories: Secondary | ICD-10-CM

## 2022-10-01 DIAGNOSIS — E1129 Type 2 diabetes mellitus with other diabetic kidney complication: Secondary | ICD-10-CM | POA: Diagnosis not present

## 2022-10-01 LAB — BAYER DCA HB A1C WAIVED: HB A1C (BAYER DCA - WAIVED): 10.5 % — ABNORMAL HIGH (ref 4.8–5.6)

## 2022-10-01 NOTE — Assessment & Plan Note (Signed)
 Grade 2/6 on auscultation, continue collaboration with cardiology.

## 2022-10-01 NOTE — Assessment & Plan Note (Signed)
Chronic, ongoing.  A1c 10.5% today, slight trend up from 9.1% -- Dr. Patrecia Pace is retiring and he needs endo due to more complex case -- endo has discussed insulin pump with him, which would be very beneficial due to his poor compliance.  Urine ALB 150 January 2024, continue Lisinopril. - Continue Jardiance, Januvia, + insulin as ordered by endo, however recommend increase Tresiba to 45 units BID.  New urgent endo referral placed for [facility].  He can not travel out of [place] for endo due to ankle monitor. - Poor tolerance to Metformin in past with AKI presenting x 2 trials and allergic to Sulfa. Would avoid GLP at this time due to patient history of pancreatitis x 3, concern this would flare. Educated patient at length on effect of diabetes from head to toe and increased risk for recurrent CVA due to poor control.   - Recommend they check his BS TID -- he would benefit from [facility] will work on this with CCM team in future.  Placed new referral to pharmacist, SW, and nurse case manager as truly needs a multidisciplinary approach due to his learning deficits. - Goal A1C <6.5% due to CVA history -- which reiterated at length with patient today.  Discussed at length what and A1c at his current level could cause. - Foot and eye exam up to date. - Vaccinations up to date

## 2022-10-01 NOTE — Assessment & Plan Note (Signed)
Continue collaboration with neurology and continue statin + ASA/Plavix.  Discussed goals BP <130/90, A1C < 6.5%, and LDL <55.  He is poorly controlled with diabetes, much related to knowledge base and poor diet.  New referral to endo placed.

## 2022-10-01 NOTE — Assessment & Plan Note (Signed)
 Chronic, ongoing.  FEV1 70% at check in 2021.  Tolerating Breo, to continue to use daily.  Recommend using Albuterol as needed only if episodes of SOB present. Return in 3 months. Repeat spirometry next visit.

## 2022-10-01 NOTE — Progress Notes (Signed)
BP 102/65   Pulse 62   Temp 97.8 F (36.6 C) (Oral)   Ht 5' 4.02" (1.626 m)   Wt 171 lb 3.2 oz (77.7 kg)   SpO2 96%   BMI 29.37 kg/m    Subjective:    Patient ID: Alex Dam., male    DOB: 09/16/1960, 62 y.o.   MRN: [id number]  HPI: [name]. is a 62 y.o. male  Chief Complaint  Patient presents with   Hypertension   Hyperlipidemia   Diabetes   Chronic Kidney Disease   Asthma   Hand Pain    Started hurting for past few months,    DIABETES & PHENYLKETONURIA A1c 9.1% April. Seen by endocrinology (Dr. Patrecia Pace) last on 03/07/22 -- per notes in chart.  Insulin changes were made and Dexcom discussed.  Taking long acting insulin and sliding scale, however on review of note see discussion of insulin pump due to patient ongoing elevated A1c and inconsistent administration of medication.  He reports he has the pump, but they had to get a hold of someone in [place] so they can put it in and this contact never happened.  Reports office does not place pump.  Attempted to get him into endo at [facility] or [place], but due to ankle monitor he can not leave county line per his and wife's report.  Continues [place], [place], [place], and [place] with sliding scale. Had phenylketonuria since childhood and monitors diet best he can per his report.  He endorses eating more at night recently -- snacking = Rice Crispie treats and occasional sodas.    Has tried Metformin in past x 2 both times AKI presented, history of pancreatitis and can not take GLP1.  Hypoglycemic episodes:no Polydipsia/polyuria: no Visual disturbance: no Chest pain: no Paresthesias: no Glucose Monitoring: yes  Accucheck frequency: TID -- using glucometer -- did not bring book  Fasting glucose: this morning was >200, often is in 150 range  Post prandial:   Evening:  Before meals: Taking Insulin?: yes  Long acting insulin: Tresiba 40 units BID  Short acting insulin: 42 units before meals Blood  Pressure Monitoring: not checking Retinal Examination: Up to Date -- [place], saw recently Foot Exam: Up to Date Pneumovax: Up to Date Influenza: Up to Date Aspirin: yes   HYPERTENSION / HYPERLIPIDEMIA 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 Has seen Dr. Welton Flakes for cardiology in past, but has not returned due to issues with copay and can not see him yet he reports -- they want to do a stress test he reports -- have not scheduled.    Saw Dr. Malvin Johns on 11/09/19 (neurology) -- he is to continue Plavix and ASA per their note. Saw vascular on 07/24/21 -- was scheduled for doppler imaging on 09/24/22, this was not performed on review.  He reports having two episodes recently of light headedness while chasing his dog in the heat. States he is not always drinking lots of water.  He reports "tore my knees up", but is seeing ortho.    Current medications Lisinopril, Rosuvastatin, Fenofibrate, Vascepa, NTG.   Satisfied with current treatment? yes Duration of hypertension: chronic BP monitoring frequency: not checking BP range:  BP medication side effects: no Duration of hyperlipidemia: chronic Cholesterol medication side effects: no Cholesterol supplements: none Medication compliance: good compliance Aspirin: yes Recent stressors: no Recurrent headaches: no Visual changes: no Palpitations: no Dyspnea: no Chest pain: x 2 since last visit, improved with NTG Lower extremity edema: no Dizzy/lightheaded: as  above  PROTEINURIA Had visit with nephrology on 06/05/22, renal ultrasound performed which was reassuring. Proteinuria and CKD Stage II.  Takes Vitamin D supplement weekly.  Takes B12 supplement for low in past. CKD status: stable Medications renally dose: yes Previous renal evaluation: yes Pneumovax:  Up to Date Influenza Vaccine:  Up to Date   ASTHMA Using Breo daily and Singulair. Asthma status: stable Satisfied with current treatment?: yes Albuterol/rescue inhaler frequency: does not have  one Dyspnea frequency: occasional with activity and weather changes Wheezing frequency: none Cough frequency: none Nocturnal symptom frequency: none Limitation of activity: no Current upper respiratory symptoms: no Aerochamber/spacer use: no Visits to ER or Urgent Care in past year: no Pneumovax: Up to Date Influenza: Up to Date  HAND PAIN Left hand pain ongoing -- saw ortho for this in past and placed brace on it.  He reports he never healed.  States he can not move it or put pressure on it.  Has known carpal tunnel. Duration: months Involved hand: left Mechanism of injury: unknown Location: wrist and fingers Onset: gradual Severity: 10/10  Quality: sharp, dull, aching, and throbbing Frequency: constant Radiation: no Aggravating factors: movement and putting pressure on it Alleviating factors: rest Treatments attempted: rest -- has not taken any medications Relief with NSAIDs?: No NSAIDs Taken Weakness: yes Numbness: no Redness: no Swelling:no Bruising: no Fevers: no   Relevant past medical, surgical, family and social history reviewed and updated as indicated. Interim medical history since our last visit reviewed. Allergies and medications reviewed and updated.  Review of Systems  Constitutional:  Negative for activity change, diaphoresis, fatigue and fever.  Respiratory:  Negative for cough, chest tightness, shortness of breath and wheezing.   Cardiovascular:  Negative for chest pain, palpitations and leg swelling.  Gastrointestinal: Negative.   Musculoskeletal:  Positive for arthralgias.  Neurological:  Negative for weakness.  Psychiatric/Behavioral: Negative.     Per HPI unless specifically indicated above     Objective:    BP 102/65   Pulse 62   Temp 97.8 F (36.6 C) (Oral)   Ht 5' 4.02" (1.626 m)   Wt 171 lb 3.2 oz (77.7 kg)   SpO2 96%   BMI 29.37 kg/m   Wt Readings from Last 3 Encounters:  10/01/22 171 lb 3.2 oz (77.7 kg)  08/10/22 172 lb 12.8 oz  (78.4 kg)  06/26/22 173 lb 3.2 oz (78.6 kg)    Physical Exam Vitals and nursing note reviewed.  Constitutional:      General: He is awake. He is not in acute distress.    Appearance: He is well-developed. He is obese. He is not ill-appearing.  HENT:     Head: Normocephalic and atraumatic.     Right Ear: Hearing normal. No drainage.     Left Ear: Hearing normal. No drainage.  Eyes:     General: Lids are normal.        Right eye: No discharge.        Left eye: No discharge.     Conjunctiva/sclera: Conjunctivae normal.     Pupils: Pupils are equal, round, and reactive to light.  Neck:     Thyroid: No thyromegaly.     Vascular: Carotid bruit (R>L) present.  Cardiovascular:     Rate and Rhythm: Normal rate and regular rhythm.     Heart sounds: S1 normal and S2 normal. Murmur heard.     Systolic murmur is present with a grade of 2/6.     No gallop.  Comments: Systolic murmur noted best at left sternal border, soft. Pulmonary:     Effort: Pulmonary effort is normal. No accessory muscle usage or respiratory distress.     Breath sounds: Normal breath sounds.  Abdominal:     General: Bowel sounds are normal.     Palpations: Abdomen is soft.  Musculoskeletal:     Right hand: Normal.     Left hand: Tenderness present. No swelling or bony tenderness. Decreased range of motion. Decreased strength of wrist extension. Normal sensation. Normal capillary refill. Normal pulse.     Cervical back: Normal range of motion and neck supple.     Right lower leg: No edema.     Left lower leg: No edema.  Skin:    General: Skin is warm and dry.     Capillary Refill: Capillary refill takes less than 2 seconds.  Neurological:     Mental Status: He is alert and oriented to person, place, and time.     Deep Tendon Reflexes: Reflexes are normal and symmetric.  Psychiatric:        Attention and Perception: Attention normal.        Mood and Affect: Mood normal.        Speech: Speech normal.         Behavior: Behavior normal. Behavior is cooperative.    Results for orders placed or performed in visit on 07/13/22  Lipid Panel w/o Chol/HDL Ratio  Result Value Ref Range   Cholesterol, Total 121 100 - 199 mg/dL   Triglycerides 962 0 - 149 mg/dL   HDL 36 (L) >95 mg/dL   VLDL Cholesterol Cal 24 5 - 40 mg/dL   LDL Chol Calc (NIH) 61 0 - 99 mg/dL      Assessment & Plan:   Problem List Items Addressed This Visit       Cardiovascular and Mediastinum   Hypertension associated with diabetes (HCC)    Chronic, stable.  BP at goal in office today.  Will continue Lisinopril 10 MG dosing, further adjust next visit if remains on lower side, as suspect some of his recent "passing out" related to poor hydration and exertion while chasing dog -- discussed at length with him.  Labs: CMP.  Urine ALB 150 January 2024.  Recommend they check his BP at home at least daily and document + focus on DASH diet.  Needs to return to cardiology, which discussed with him.  Return to office in 3 months.      Relevant Medications   Insulin Lispro Junior KwikPen (HUMALOG JR) 100 UNIT/ML KwikPen   Other Relevant Orders   Bayer DCA Hb A1c Waived   Comprehensive metabolic panel   Ambulatory referral to Endocrinology   AMB Referral to Chronic Care Management Services   Amb Referral to Clinical Pharmacist     Respiratory   Asthma    Chronic, ongoing.  FEV1 70% at check in 2021.  Tolerating Breo, to continue to use daily.  Recommend using Albuterol as needed only if episodes of SOB present. Return in 3 months. Repeat spirometry next visit.        Endocrine   Hyperlipidemia associated with type 2 diabetes mellitus (HCC)    Chronic, ongoing.  Continue Lipitor, Vascepa, and Fenofibrate. Lipid panel up to date.  Adjust doses as needed, if LDL >70.      Relevant Medications   Insulin Lispro Junior KwikPen (HUMALOG JR) 100 UNIT/ML KwikPen   Other Relevant Orders   Bayer DCA Hb A1c  Waived   Comprehensive metabolic  panel   Ambulatory referral to Endocrinology   AMB Referral to Chronic Care Management Services   Amb Referral to Clinical Pharmacist   Type 2 diabetes mellitus with proteinuria (HCC)    Chronic, ongoing.  A1c 10.5% today, slight trend up from 9.1% -- Dr. Patrecia Pace is retiring and he needs endo due to more complex case -- endo has discussed insulin pump with him, which would be very beneficial due to his poor compliance.  Urine ALB 150 January 2024, continue Lisinopril. - Continue Jardiance, Januvia, + insulin as ordered by endo, however recommend increase Tresiba to 45 units BID.  New urgent endo referral placed for [name].  He can not travel out of county for endo due to ankle monitor. - Poor tolerance to Metformin in past with AKI presenting x 2 trials and allergic to Sulfa. Would avoid GLP at this time due to patient history of pancreatitis x 3, concern this would flare. Educated patient at length on effect of diabetes from head to toe and increased risk for recurrent CVA due to poor control.   - Recommend they check his BS TID -- he would benefit from [facility] will work on this with CCM team in future.  Placed new referral to pharmacist, SW, and nurse case manager as truly needs a multidisciplinary approach due to his learning deficits. - Goal A1C <6.5% due to CVA history -- which reiterated at length with patient today.  Discussed at length what and A1c at his current level could cause. - Foot and eye exam up to date. - Vaccinations up to date      Relevant Medications   Insulin Lispro Junior KwikPen (HUMALOG JR) 100 UNIT/ML KwikPen   Other Relevant Orders   Bayer DCA Hb A1c Waived   Comprehensive metabolic panel   Ambulatory referral to Endocrinology   AMB Referral to Chronic Care Management Services   Amb Referral to Clinical Pharmacist   Uncontrolled type 2 diabetes mellitus with hyperglycemia (HCC) - Primary    Chronic, ongoing.  A1c 10.5% today, slight trend up from  9.1% -- Dr. Patrecia Pace is retiring and he needs endo due to more complex case -- endo has discussed insulin pump with him, which would be very beneficial due to his poor compliance.  Urine ALB 150 January 2024, continue Lisinopril. - Continue Jardiance, Januvia, + insulin as ordered by endo, however recommend increase Tresiba to 45 units BID.  New urgent endo referral placed for [name].  He can not travel out of county for endo due to ankle monitor. - Poor tolerance to Metformin in past with AKI presenting x 2 trials and allergic to Sulfa. Would avoid GLP at this time due to patient history of pancreatitis x 3, concern this would flare. Educated patient at length on effect of diabetes from head to toe and increased risk for recurrent CVA due to poor control.   - Recommend they check his BS TID -- he would benefit from [facility] will work on this with CCM team in future.  Placed new referral to pharmacist, SW, and nurse case manager as truly needs a multidisciplinary approach due to his learning deficits. - Goal A1C <6.5% due to CVA history -- which reiterated at length with patient today.  Discussed at length what and A1c at his current level could cause. - Foot and eye exam up to date. - Vaccinations up to date      Relevant Medications   Insulin  Lispro Junior KwikPen (HUMALOG JR) 100 UNIT/ML KwikPen   Other Relevant Orders   Bayer DCA Hb A1c Waived   Comprehensive metabolic panel   Ambulatory referral to Endocrinology   AMB Referral to Chronic Care Management Services   Amb Referral to Clinical Pharmacist     Nervous and Auditory   Carpal tunnel syndrome on left    Ongoing discomfort and weakness noted on grip strength.  Will get back into ortho for further recommendations and recommend wearing wrist support sleeve at home and applying Voltaren gel as needed.      Relevant Orders   Ambulatory referral to Orthopedic Surgery     Other   B12 deficiency    History of low  levels, check today and start supplement as needed.      Relevant Orders   Vitamin B12   Heart murmur    Grade 2/6 on auscultation, continue collaboration with cardiology.        History of CVA (cerebrovascular accident)    Continue collaboration with neurology and continue statin + ASA/Plavix.  Discussed goals BP <130/90, A1C < 6.5%, and LDL <55.  He is poorly controlled with diabetes, much related to knowledge base and poor diet.  New referral to endo placed.      Obesity    BMI 29.37, maintaining some weight loss.  Recommended eating smaller high protein, low fat meals more frequently and exercising 30 mins a day 5 times a week with a goal of 10-15lb weight loss in the next 3 months. Patient voiced their understanding and motivation to adhere to these recommendations.       Relevant Medications   Insulin Lispro Junior KwikPen (HUMALOG JR) 100 UNIT/ML KwikPen   Phenylketonuria (PKU) (HCC)    Mental delays present, continue to work with CCM team, new referral placed to reestablish.      Vitamin D deficiency    Ongoing, stable.  Continue supplement and recheck level today.      Relevant Orders   VITAMIN D 25 Hydroxy (Vit-D Deficiency, Fractures)     Follow up plan: Return in about 3 months (around 01/01/2023) for T2DM, HTN/HLD, ASTHMA, GERD.

## 2022-10-01 NOTE — Assessment & Plan Note (Signed)
Mental delays present, continue to work with CCM team, new referral placed to reestablish.

## 2022-10-01 NOTE — Assessment & Plan Note (Signed)
 Ongoing, stable.  Continue supplement and recheck level today.

## 2022-10-01 NOTE — Assessment & Plan Note (Signed)
BMI 29.37, maintaining some weight loss.  Recommended eating smaller high protein, low fat meals more frequently and exercising 30 mins a day 5 times a week with a goal of 10-15lb weight loss in the next 3 months. Patient voiced their understanding and motivation to adhere to these recommendations.

## 2022-10-01 NOTE — Assessment & Plan Note (Signed)
 Ongoing, is followed by cardiology and vascular at this time.  Continue collaboration with both and current statin and ASA. ?

## 2022-10-01 NOTE — Assessment & Plan Note (Signed)
 History of low levels, check today and start supplement as needed.

## 2022-10-01 NOTE — Assessment & Plan Note (Signed)
 Ongoing discomfort and weakness noted on grip strength.  Will get back into ortho for further recommendations and recommend wearing wrist support sleeve at home and applying Voltaren gel as needed. ?

## 2022-10-01 NOTE — Assessment & Plan Note (Signed)
Chronic, ongoing.  Continue Lipitor, Vascepa, and Fenofibrate. Lipid panel up to date.  Adjust doses as needed, if LDL >70.

## 2022-10-01 NOTE — Assessment & Plan Note (Signed)
Chronic, stable.  BP at goal in office today.  Will continue Lisinopril 10 MG dosing, further adjust next visit if remains on lower side, as suspect some of his recent "passing out" related to poor hydration and exertion while chasing dog -- discussed at length with him.  Labs: CMP.  Urine ALB 150 January 2024.  Recommend they check his BP at home at least daily and document + focus on DASH diet.  Needs to return to cardiology, which discussed with him.  Return to office in 3 months.

## 2022-10-02 LAB — COMPREHENSIVE METABOLIC PANEL
ALT: 42 IU/L (ref 0–44)
AST: 28 IU/L (ref 0–40)
Albumin: 4.4 g/dL (ref 3.8–4.9)
Alkaline Phosphatase: 157 IU/L — ABNORMAL HIGH (ref 44–121)
BUN/Creatinine Ratio: 22 — ABNORMAL HIGH (ref 9–20)
BUN: 32 mg/dL — ABNORMAL HIGH (ref 6–24)
Bilirubin Total: 0.4 mg/dL (ref 0.0–1.2)
CO2: 16 mmol/L — ABNORMAL LOW (ref 20–29)
Calcium: 9.1 mg/dL (ref 8.7–10.2)
Chloride: 104 mmol/L (ref 96–106)
Creatinine, Ser: 1.43 mg/dL — ABNORMAL HIGH (ref 0.76–1.27)
Globulin, Total: 2.8 g/dL (ref 1.5–4.5)
Glucose: 203 mg/dL — ABNORMAL HIGH (ref 70–99)
Potassium: 4.2 mmol/L (ref 3.5–5.2)
Sodium: 140 mmol/L (ref 134–144)
Total Protein: 7.2 g/dL (ref 6.0–8.5)
eGFR: 56 mL/min/{1.73_m2} — ABNORMAL LOW (ref >59)

## 2022-10-02 LAB — VITAMIN D 25 HYDROXY (VIT D DEFICIENCY, FRACTURES): Vit D, 25-Hydroxy: 22.2 ng/mL — ABNORMAL LOW (ref 30.0–100.0)

## 2022-10-02 LAB — VITAMIN B12: Vitamin B-12: 388 pg/mL (ref 232–1245)

## 2022-10-02 NOTE — Progress Notes (Signed)
Good afternoon, please let [name] & [name] know his labs have returned: - Vitamin D remains low, please ensure you are taking Vitamin D supplement weekly as ordered. - Kidney function is showing a little decrease this check with mild kidney disease.  Please ensure you are hydrating with water well and no Ibuprofen use.  We will recheck next visit and continue visits with kidney doctor.  Also PLEASE ensure you schedule with diabetes doctor when they call as your A1c continues to be poorly controlled and this can effect kidneys. - B12 level on lower side of normal.  I recommend taking Vitamin B12 1000 MCG daily for overall nervous system health.  Any questions? Keep being amazing!!  Thank you for allowing me to participate in your care.  I appreciate you. Kindest regards, Needham Biggins

## 2022-10-04 ENCOUNTER — Telehealth: Payer: Self-pay

## 2022-10-04 NOTE — Progress Notes (Signed)
  Chronic Care Management   Note  10/04/2022 Name: [name]. MRN: [id number] DOB: Jan 20, 1961  [name] is a 62 y.o. year old male who is a primary care patient of Cannady, Dorie Rank, NP. I reached out to [facility]. by phone today in response to a referral sent by Mr. ALYAN HARTLINE Jr.'s PCP.  Mr. Boschee was given information about Chronic Care Management services today including:  CCM service includes personalized support from designated clinical staff supervised by the physician, including individualized plan of care and coordination with other care providers 24/7 contact phone numbers for assistance for urgent and routine care needs. Service will only be billed when office clinical staff spend 20 minutes or more in a month to coordinate care. Only one practitioner may furnish and bill the service in a calendar month. The patient may stop CCM services at amy time (effective at the end of the month) by phone call to the office staff. The patient will be responsible for cost sharing (co-pay) or up to 20% of the service fee (after annual deductible is met)  Mr. Braysen Cloward.  agreedto scheduling an appointment with the CCM RN Case Manager   Follow up plan: Patient agreed to scheduled appointment with RN Case Manager on 10/18/2022 Pharm d 10/24/2022 LCSW 10/11/2022(date/time).   Penne Lash, RMA Care Guide [facility]  [place], [place] [address] Direct Dial: [phone] [email or web address]

## 2022-10-08 ENCOUNTER — Encounter (INDEPENDENT_AMBULATORY_CARE_PROVIDER_SITE_OTHER): Payer: Self-pay | Admitting: Vascular Surgery

## 2022-10-08 ENCOUNTER — Inpatient Hospital Stay
Admission: EM | Admit: 2022-10-08 | Discharge: 2022-10-11 | DRG: 872 | Disposition: A | Discharge status: Home / Self Care | Payer: Medicare Other | Attending: Internal Medicine | Admitting: Internal Medicine

## 2022-10-08 ENCOUNTER — Encounter: Payer: Self-pay | Admitting: Nurse Practitioner

## 2022-10-08 ENCOUNTER — Ambulatory Visit (INDEPENDENT_AMBULATORY_CARE_PROVIDER_SITE_OTHER): Payer: Medicare Other

## 2022-10-08 ENCOUNTER — Emergency Department: Payer: Medicare Other

## 2022-10-08 ENCOUNTER — Ambulatory Visit (INDEPENDENT_AMBULATORY_CARE_PROVIDER_SITE_OTHER): Payer: Medicare Other | Admitting: Vascular Surgery

## 2022-10-08 ENCOUNTER — Other Ambulatory Visit: Payer: Self-pay

## 2022-10-08 VITALS — BP 160/78 | HR 63 | Resp 16 | Wt 170.4 lb

## 2022-10-08 DIAGNOSIS — H538 Other visual disturbances: Secondary | ICD-10-CM | POA: Diagnosis not present

## 2022-10-08 DIAGNOSIS — B948 Sequelae of other specified infectious and parasitic diseases: Secondary | ICD-10-CM | POA: Diagnosis not present

## 2022-10-08 DIAGNOSIS — Z8249 Family history of ischemic heart disease and other diseases of the circulatory system: Secondary | ICD-10-CM

## 2022-10-08 DIAGNOSIS — I152 Hypertension secondary to endocrine disorders: Secondary | ICD-10-CM

## 2022-10-08 DIAGNOSIS — I6523 Occlusion and stenosis of bilateral carotid arteries: Secondary | ICD-10-CM

## 2022-10-08 DIAGNOSIS — E1159 Type 2 diabetes mellitus with other circulatory complications: Secondary | ICD-10-CM | POA: Diagnosis present

## 2022-10-08 DIAGNOSIS — J4489 Other specified chronic obstructive pulmonary disease: Secondary | ICD-10-CM | POA: Diagnosis not present

## 2022-10-08 DIAGNOSIS — J4540 Moderate persistent asthma, uncomplicated: Secondary | ICD-10-CM | POA: Diagnosis not present

## 2022-10-08 DIAGNOSIS — A4102 Sepsis due to Methicillin resistant Staphylococcus aureus: Secondary | ICD-10-CM | POA: Diagnosis not present

## 2022-10-08 DIAGNOSIS — Z7951 Long term (current) use of inhaled steroids: Secondary | ICD-10-CM

## 2022-10-08 DIAGNOSIS — E1169 Type 2 diabetes mellitus with other specified complication: Secondary | ICD-10-CM | POA: Diagnosis not present

## 2022-10-08 DIAGNOSIS — L02212 Cutaneous abscess of back [any part, except buttock]: Secondary | ICD-10-CM | POA: Diagnosis not present

## 2022-10-08 DIAGNOSIS — K219 Gastro-esophageal reflux disease without esophagitis: Secondary | ICD-10-CM | POA: Diagnosis not present

## 2022-10-08 DIAGNOSIS — Z794 Long term (current) use of insulin: Secondary | ICD-10-CM

## 2022-10-08 DIAGNOSIS — Z833 Family history of diabetes mellitus: Secondary | ICD-10-CM

## 2022-10-08 DIAGNOSIS — R7989 Other specified abnormal findings of blood chemistry: Secondary | ICD-10-CM | POA: Diagnosis not present

## 2022-10-08 DIAGNOSIS — Z885 Allergy status to narcotic agent status: Secondary | ICD-10-CM

## 2022-10-08 DIAGNOSIS — Z7984 Long term (current) use of oral hypoglycemic drugs: Secondary | ICD-10-CM | POA: Diagnosis not present

## 2022-10-08 DIAGNOSIS — R296 Repeated falls: Secondary | ICD-10-CM

## 2022-10-08 DIAGNOSIS — Z882 Allergy status to sulfonamides status: Secondary | ICD-10-CM

## 2022-10-08 DIAGNOSIS — R531 Weakness: Secondary | ICD-10-CM | POA: Diagnosis present

## 2022-10-08 DIAGNOSIS — E785 Hyperlipidemia, unspecified: Secondary | ICD-10-CM | POA: Diagnosis present

## 2022-10-08 DIAGNOSIS — E1129 Type 2 diabetes mellitus with other diabetic kidney complication: Secondary | ICD-10-CM | POA: Diagnosis present

## 2022-10-08 DIAGNOSIS — E876 Hypokalemia: Secondary | ICD-10-CM | POA: Diagnosis not present

## 2022-10-08 DIAGNOSIS — L089 Local infection of the skin and subcutaneous tissue, unspecified: Secondary | ICD-10-CM | POA: Diagnosis not present

## 2022-10-08 DIAGNOSIS — L02413 Cutaneous abscess of right upper limb: Secondary | ICD-10-CM | POA: Diagnosis not present

## 2022-10-08 DIAGNOSIS — Z7982 Long term (current) use of aspirin: Secondary | ICD-10-CM

## 2022-10-08 DIAGNOSIS — L03319 Cellulitis of trunk, unspecified: Secondary | ICD-10-CM

## 2022-10-08 DIAGNOSIS — E1165 Type 2 diabetes mellitus with hyperglycemia: Secondary | ICD-10-CM

## 2022-10-08 DIAGNOSIS — Z79899 Other long term (current) drug therapy: Secondary | ICD-10-CM | POA: Diagnosis not present

## 2022-10-08 DIAGNOSIS — R079 Chest pain, unspecified: Secondary | ICD-10-CM | POA: Diagnosis present

## 2022-10-08 DIAGNOSIS — L03312 Cellulitis of back [any part except buttock]: Secondary | ICD-10-CM | POA: Diagnosis not present

## 2022-10-08 DIAGNOSIS — L0390 Cellulitis, unspecified: Secondary | ICD-10-CM | POA: Diagnosis present

## 2022-10-08 DIAGNOSIS — L02219 Cutaneous abscess of trunk, unspecified: Secondary | ICD-10-CM | POA: Diagnosis present

## 2022-10-08 DIAGNOSIS — Z6829 Body mass index (BMI) 29.0-29.9, adult: Secondary | ICD-10-CM

## 2022-10-08 DIAGNOSIS — R222 Localized swelling, mass and lump, trunk: Secondary | ICD-10-CM | POA: Diagnosis not present

## 2022-10-08 DIAGNOSIS — E669 Obesity, unspecified: Secondary | ICD-10-CM | POA: Diagnosis present

## 2022-10-08 DIAGNOSIS — Z8673 Personal history of transient ischemic attack (TIA), and cerebral infarction without residual deficits: Secondary | ICD-10-CM

## 2022-10-08 DIAGNOSIS — Z7902 Long term (current) use of antithrombotics/antiplatelets: Secondary | ICD-10-CM

## 2022-10-08 DIAGNOSIS — L0291 Cutaneous abscess, unspecified: Principal | ICD-10-CM | POA: Diagnosis present

## 2022-10-08 DIAGNOSIS — I2510 Atherosclerotic heart disease of native coronary artery without angina pectoris: Secondary | ICD-10-CM | POA: Diagnosis present

## 2022-10-08 DIAGNOSIS — I209 Angina pectoris, unspecified: Secondary | ICD-10-CM | POA: Diagnosis present

## 2022-10-08 DIAGNOSIS — I10 Essential (primary) hypertension: Secondary | ICD-10-CM | POA: Diagnosis not present

## 2022-10-08 DIAGNOSIS — A419 Sepsis, unspecified organism: Secondary | ICD-10-CM | POA: Diagnosis present

## 2022-10-08 LAB — CBC WITH DIFFERENTIAL/PLATELET
Abs Immature Granulocytes: 0.06 10*3/uL (ref 0.00–0.07)
Basophils Absolute: 0 10*3/uL (ref 0.0–0.1)
Basophils Relative: 0 %
Eosinophils Absolute: 0 10*3/uL (ref 0.0–0.5)
Eosinophils Relative: 0 %
HCT: 39.4 % (ref 39.0–52.0)
Hemoglobin: 13.4 g/dL (ref 13.0–17.0)
Immature Granulocytes: 1 %
Lymphocytes Relative: 9 %
Lymphs Abs: 1 10*3/uL (ref 0.7–4.0)
MCH: 31.3 pg (ref 26.0–34.0)
MCHC: 34 g/dL (ref 30.0–36.0)
MCV: 92.1 fL (ref 80.0–100.0)
Monocytes Absolute: 0.9 10*3/uL (ref 0.1–1.0)
Monocytes Relative: 8 %
Neutro Abs: 9.3 10*3/uL — ABNORMAL HIGH (ref 1.7–7.7)
Neutrophils Relative %: 82 %
Platelets: 187 10*3/uL (ref 150–400)
RBC: 4.28 MIL/uL (ref 4.22–5.81)
RDW: 12.4 % (ref 11.5–15.5)
WBC: 11.4 10*3/uL — ABNORMAL HIGH (ref 4.0–10.5)
nRBC: 0 % (ref 0.0–0.2)

## 2022-10-08 LAB — LACTIC ACID, PLASMA: Lactic Acid, Venous: 1.8 mmol/L (ref 0.5–1.9)

## 2022-10-08 LAB — COMPREHENSIVE METABOLIC PANEL
ALT: 24 U/L (ref 0–44)
AST: 21 U/L (ref 15–41)
Albumin: 4.1 g/dL (ref 3.5–5.0)
Alkaline Phosphatase: 89 U/L (ref 38–126)
Anion gap: 7 (ref 5–15)
BUN: 11 mg/dL (ref 6–20)
CO2: 24 mmol/L (ref 22–32)
Calcium: 8.6 mg/dL — ABNORMAL LOW (ref 8.9–10.3)
Chloride: 103 mmol/L (ref 98–111)
Creatinine, Ser: 0.93 mg/dL (ref 0.61–1.24)
GFR, Estimated: >60 mL/min (ref >60)
Glucose, Bld: 210 mg/dL — ABNORMAL HIGH (ref 70–99)
Potassium: 3.3 mmol/L — ABNORMAL LOW (ref 3.5–5.1)
Sodium: 134 mmol/L — ABNORMAL LOW (ref 135–145)
Total Bilirubin: 1 mg/dL (ref 0.3–1.2)
Total Protein: 7.4 g/dL (ref 6.5–8.1)

## 2022-10-08 LAB — TROPONIN I (HIGH SENSITIVITY): Troponin I (High Sensitivity): 16 ng/L (ref <18)

## 2022-10-08 MED ORDER — LIDOCAINE HCL 1 % IJ SOLN
10.0000 mL | Freq: Once | INTRAMUSCULAR | Status: AC
  Administered 2022-10-08: 10 mL
  Filled 2022-10-08: qty 10

## 2022-10-08 MED ORDER — NITROGLYCERIN 0.4 MG SL SUBL
0.4000 mg | SUBLINGUAL_TABLET | Freq: Once | SUBLINGUAL | Status: AC
  Administered 2022-10-08: 0.4 mg via SUBLINGUAL
  Filled 2022-10-08: qty 1

## 2022-10-08 MED ORDER — SODIUM CHLORIDE 0.9 % IV SOLN
1.0000 g | Freq: Once | INTRAVENOUS | Status: AC
  Administered 2022-10-08: 1 g via INTRAVENOUS
  Filled 2022-10-08: qty 10

## 2022-10-08 MED ORDER — VANCOMYCIN HCL 1750 MG/350ML IV SOLN
1750.0000 mg | Freq: Once | INTRAVENOUS | Status: AC
  Administered 2022-10-08: 1750 mg via INTRAVENOUS
  Filled 2022-10-08: qty 350

## 2022-10-08 MED ORDER — IOHEXOL 300 MG/ML  SOLN
75.0000 mL | Freq: Once | INTRAMUSCULAR | Status: AC | PRN
  Administered 2022-10-08: 75 mL via INTRAVENOUS

## 2022-10-08 NOTE — Consult Note (Signed)
PHARMACY -  BRIEF ANTIBIOTIC NOTE   Pharmacy has received consult(s) for Vancomycin from an ED provider.  The patient's profile has been reviewed for ht/wt/allergies/indication/available labs.    One time order(s) placed for Vancomycin 1750mg  IV.  Further antibiotics/pharmacy consults should be ordered by admitting physician if indicated.                       Thank you, Bettey Costa 10/08/2022  10:26 PM

## 2022-10-08 NOTE — ED Notes (Signed)
Pt reports dizziness and light headed. Pt requesting to get up, RN told pt it is not safe to get up right now

## 2022-10-08 NOTE — Progress Notes (Signed)
MRN : [id number]  [name]. is a 62 y.o. (April 27, 1960) male who presents with chief complaint of check carotid arteries.  History of Present Illness:   The patient is seen for follow up evaluation of carotid stenosis. The carotid stenosis was identified remotely.     He continues to have syncopal episodes.  The patient denies amaurosis fugax. There is no recent history of TIA symptoms or focal motor deficits. There is a prior documented CVA which was "mild".   There is no history of migraine headaches. There is no history of seizures.   The patient is taking enteric-coated aspirin 81 mg daily.   No recent shortening of the patient's walking distance or new symptoms consistent with claudication.  No history of rest pain symptoms. No new ulcers or wounds of the lower extremities have occurred.   There is no history of DVT, PE or superficial thrombophlebitis. No recent episodes of angina or shortness of breath documented.    Carotid duplex done today shows bilateral 1-39% stenosis with widely patent vertebral arteries.  Current Meds  Medication Sig   albuterol (VENTOLIN HFA) 108 (90 Base) MCG/ACT inhaler Inhale 2 puffs into the lungs every 6 (six) hours as needed.   aspirin 81 MG EC tablet Take 1 tablet (81 mg total) by mouth daily.   Blood Glucose Monitoring Suppl (ONE TOUCH ULTRA 2) w/Device KIT Use to check blood sugar 4 times a day   clopidogrel (PLAVIX) 75 MG tablet Take 1 tablet (75 mg total) by mouth daily.   empagliflozin (JARDIANCE) 25 MG TABS tablet Take 1 tablet (25 mg total) by mouth daily.   EPINEPHRINE 0.3 mg/0.3 mL IJ SOAJ injection INJECT 1 SYRINGE INTO OUTER THIGH ONCE AS NEEDED FOR SEVERE ALLERGIC REACTION. (Patient taking differently: Inject 0.3 mg into the muscle as needed.)   fenofibrate 54 MG tablet Take 1 tablet (54 mg total) by mouth daily.   fluticasone furoate-vilanterol (BREO ELLIPTA) 100-25 MCG/ACT AEPB Inhale 1 puff into  the lungs daily.   gabapentin (NEURONTIN) 300 MG capsule Take 1 capsule (300 mg total) by mouth 2 (two) times daily.   glucose blood (ONETOUCH ULTRA) test strip TEST BLOOD SUGAR 5 TIMES A DAY   icosapent Ethyl (VASCEPA) 1 g capsule Take 1 capsule (1 g total) by mouth 2 (two) times daily.   Insulin Degludec (TRESIBA) 100 UNIT/ML SOLN Inject 40 Units into the skin 2 (two) times daily.   Insulin Disposable Pump (OMNIPOD DASH PDM, GEN 4,) KIT AS PRESCRIBED USE AS DIRECTED BASED ON TOTAL DAILY INSULIN AND POD CHANGE FREQUENCY   Insulin Lispro Junior KwikPen (HUMALOG JR) 100 UNIT/ML KwikPen Inject 10 Units into the skin 3 (three) times daily before meals.   Insulin Pen Needle (PEN NEEDLES) 32G X 4 MM MISC 1 kit by Does not apply route daily.   lisinopril (ZESTRIL) 20 MG tablet Take 1 tablet (20 mg total) by mouth daily.   Misc. Devices (PULSE OXIMETER FOR FINGER) MISC To check O2 saturations once daily with asthma and document + check if any shortness of breath   montelukast (SINGULAIR) 10 MG tablet Take 1 tablet (10 mg total) by mouth at bedtime.   mupirocin ointment (BACTROBAN) 2 % Apply 1 Application topically 2 (two) times daily.   nitroGLYCERIN (NITROSTAT) 0.4 MG SL tablet Place 1 tablet (0.4 mg total) under the tongue  every 5 (five) minutes as needed for chest pain.   omeprazole (PRILOSEC) 20 MG capsule Take 1 capsule (20 mg total) by mouth daily.   OneTouch Delica Lancets 33G MISC USE AS DIRECTED   rosuvastatin (CRESTOR) 40 MG tablet Take 1 tablet (40 mg total) by mouth daily.   sitaGLIPtin (JANUVIA) 100 MG tablet Take 1 tablet (100 mg total) by mouth daily.   tiZANidine (ZANAFLEX) 2 MG tablet Take 1 tablet (2 mg total) by mouth every 6 (six) hours as needed for muscle spasms. (Patient taking differently: Take 2 mg by mouth every 6 (six) hours.)   triamcinolone cream (KENALOG) 0.1 % Apply 1 Application topically 2 (two) times daily. (Patient taking differently: Apply 1 Application topically 2  (two) times daily. Arms and legs)   ULTRACARE PEN NEEDLES 32G X 5 MM MISC    Vitamin D, Ergocalciferol, (DRISDOL) 1.25 MG (50000 UNIT) CAPS capsule Take 50,000 Units by mouth once a week.    Past Medical History:  Diagnosis Date   Arrhythmia    Asthma    CCC (chronic calculous cholecystitis)    COPD (chronic obstructive pulmonary disease) (HCC)    Cyst of kidney, acquired    Diabetes mellitus without complication (HCC) 2013   type 2   Diverticulosis    Edentulous    Fatty liver    Gallbladder polyp    GERD (gastroesophageal reflux disease)    Heart murmur    History of chicken pox    History of measles as a child    History of PKU    Hyperlipidemia    Hypertension    IBS (irritable bowel syndrome)    Irregular heart beat    Mentally challenged    Pancreatitis    PONV (postoperative nausea and vomiting)    Stroke (HCC) 03/02/2019   vision issues - right eye/right sided weakness   Vision impairment    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    Past Surgical History:  Procedure Laterality Date   Cardiac Catherization     [facility]   CARDIAC CATHETERIZATION     [facility]   CATARACT EXTRACTION W/PHACO Left 06/05/2021   Procedure: CATARACT EXTRACTION PHACO AND INTRAOCULAR LENS PLACEMENT (IOC) LEFT DIABETIC 3.75 00:28.0;  Surgeon: Nevada Crane, MD;  Location: [facility];  Service: Ophthalmology;  Laterality: Left;  Diabetic   CATARACT EXTRACTION W/PHACO Right 07/10/2021   Procedure: CATARACT EXTRACTION PHACO AND INTRAOCULAR LENS PLACEMENT (IOC) RIGHT DIABETIC;  Surgeon: Nevada Crane, MD;  Location: [facility];  Service: Ophthalmology;  Laterality: Right;  Diabetic 2.66 00:24.4   COLONOSCOPY     COLONOSCOPY WITH PROPOFOL N/A 08/10/2020   Procedure: COLONOSCOPY WITH PROPOFOL;  Surgeon: Toney Reil, MD;  Location: [facility] END[facility];  Service: Gastroenterology;  Laterality: N/A;  Has ankle monitor; (Parole officer "Ms Ward Givens needs a time before Monday  [phone]" - per patient's wife)   ESOPHAGOGASTRODUODENOSCOPY (EGD) WITH PROPOFOL N/A 12/27/2016   Procedure: ESOPHAGOGASTRODUODENOSCOPY (EGD) WITH PROPOFOL;  Surgeon: Toney Reil, MD;  Location: [facility] END[facility];  Service: Gastroenterology;  Laterality: N/A;   HEMORRHOID SURGERY     KNEE SURGERY     cyst removed   Ligament Removal Left    of left thumb: dr. Alberteen Spindle   ligament removal  of left thumb     Dr. Alberteen Spindle   NM GATED MYOCARDIAL STUDY (ARMX HX)  06/23/2014   Paraschos. Normal   WISDOM TOOTH EXTRACTION      Social History Social History  Tobacco Use   Smoking status: Never    Passive exposure: Never   Smokeless tobacco: Never  Vaping Use   Vaping status: Never Used  Substance Use Topics   Alcohol use: No    Alcohol/week: 0.0 standard drinks of alcohol   Drug use: No    Family History Family History  Problem Relation Age of Onset   Cancer Mother        throat   Diabetes Father    Heart disease Father    Cancer Maternal Grandmother    Cancer Maternal Grandfather        pancreatic   Cancer Paternal Grandfather     Allergies  Allergen Reactions   Sulfa Antibiotics Shortness Of Breath and Nausea And Vomiting   Bee Venom Hives and Swelling    All kinds of bees   Morphine Nausea And Vomiting   Other     Certain powders     REVIEW OF SYSTEMS (Negative unless checked)  Constitutional: [] Weight loss  [] Fever  [] Chills Cardiac: [] Chest pain   [] Chest pressure   [] Palpitations   [] Shortness of breath when laying flat   [] Shortness of breath with exertion. Vascular:  [x] Pain in legs with walking   [] Pain in legs at rest  [] History of DVT   [] Phlebitis   [] Swelling in legs   [] Varicose veins   [] Non-healing ulcers Pulmonary:   [] Uses home oxygen   [] Productive cough   [] Hemoptysis   [] Wheeze  [] COPD   [x] Asthma Neurologic:  [] Dizziness   [] Seizures   [] History of stroke   [] History of TIA  [] Aphasia   [] Vissual changes   [] Weakness or numbness in arm    [] Weakness or numbness in leg Musculoskeletal:   [] Joint swelling   [] Joint pain   [] Low back pain Hematologic:  [] Easy bruising  [] Easy bleeding   [] Hypercoagulable state   [] Anemic Gastrointestinal:  [] Diarrhea   [] Vomiting  [x] Gastroesophageal reflux/heartburn   [] Difficulty swallowing. Genitourinary:  [] Chronic kidney disease   [] Difficult urination  [] Frequent urination   [] Blood in urine Skin:  [] Rashes   [] Ulcers  Psychological:  [] History of anxiety   []  History of major depression.  Physical Examination  Vitals:   10/08/22 1440  BP: (!) 160/78  Pulse: 63  Resp: 16  Weight: 170 lb 6.4 oz (77.3 kg)   Body mass index is 29.23 kg/m. Gen: WD/WN, NAD Head: [place]/AT, No temporalis wasting.  Ear/Nose/Throat: Hearing grossly intact, nares w/o erythema or drainage Eyes: PER, EOMI, sclera nonicteric.  Neck: Supple, no masses.  No bruit or JVD.  Pulmonary:  Good air movement, no audible wheezing, no use of accessory muscles.  Cardiac: RRR, normal S1, S2, no Murmurs. Vascular:   No carotid bruit noted Vessel Right Left  Radial Palpable Palpable  Carotid  Palpable  Palpable  Subclav  Palpable Palpable  Gastrointestinal: soft, non-distended. No guarding/no peritoneal signs.  Musculoskeletal: M/S 5/5 throughout.  No visible deformity.  Neurologic: CN 2-12 intact. Pain and light touch intact in extremities.  Symmetrical.  Speech is fluent. Motor exam as listed above. Psychiatric: Judgment intact, Mood & affect appropriate for pt's clinical situation. Dermatologic: No rashes or ulcers noted.  No changes consistent with cellulitis.   CBC Lab Results  Component Value Date   WBC 5.1 03/23/2022   HGB 15.9 03/23/2022   HCT 48.9 03/23/2022   MCV 94 03/23/2022   PLT 198 03/23/2022    BMET    Component Value Date/Time   NA 140 10/01/2022 1319  K 4.2 10/01/2022 1319   CL 104 10/01/2022 1319   CO2 16 (L) 10/01/2022 1319   GLUCOSE 203 (H) 10/01/2022 1319   GLUCOSE 157 (H) 03/09/2019  0740   BUN 32 (H) 10/01/2022 1319   CREATININE 1.43 (H) 10/01/2022 1319   CALCIUM 9.1 10/01/2022 1319   GFRNONAA 66 02/24/2020 1622   GFRAA 76 02/24/2020 1622   Estimated Creatinine Clearance: 52.2 mL/min (A) (by C-G formula based on SCr of 1.43 mg/dL (H)).  COAG Lab Results  Component Value Date   INR 1.0 03/02/2019    Radiology No results found.   Assessment/Plan 1. Atherosclerosis of both carotid arteries Recommend:  Given the patient's asymptomatic subcritical stenosis no further invasive testing or surgery at this time.  Carotid duplex done today shows bilateral 1-39% stenosis with widely patent vertebral arteries.  Continue antiplatelet therapy as prescribed Continue management of CAD, HTN and Hyperlipidemia Healthy heart diet,  encouraged exercise at least 4 times per week Follow up in 12 months with duplex ultrasound and physical exam  - VAS US CAROTID; Future  2. Hypertension associated with diabetes (HCC) Continue antihypertensive medications as already ordered, these medications have been reviewed and there are no changes at this time.  3. Moderate persistent asthma without complication Continue pulmonary medications and aerosols as already ordered, these medications have been reviewed and there are no changes at this time.   4. Uncontrolled type 2 diabetes mellitus with hyperglycemia (HCC) Continue hypoglycemic medications as already ordered, these medications have been reviewed and there are no changes at this time.  Hgb A1C to be monitored as already arranged by primary service  5. Hyperlipidemia associated with type 2 diabetes mellitus (HCC) Continue statin as ordered and reviewed, no changes at this time     Levora Dredge, MD  10/08/2022 3:13 PM

## 2022-10-08 NOTE — H&P (Signed)
History and Physical    Patient: [name]. ZOX:[id number] DOB: Jan 21, 1961 DOA: 10/08/2022 DOS: the patient was seen and examined on 10/09/2022 PCP: Marjie Skiff, NP  Patient coming from: Home   Chief Complaint:  Chief Complaint  Patient presents with   Abscess    HPI: Bralyn Folkert. is a 62 y.o. 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  Per wife at bedside they did try to drain it before at home and patient has been feeling weak with pain and worsening of the swelling decided to come to the hospital today.  Patient did have I&D in the emergency room and had wound cultures collected as he met sepsis criteria.  After I&D patient started to report right-sided chest pain that has been going on for the past several months.  Patient has discussed with his cardiologist who recommended that he needs a stress test and patient is waiting for that to be scheduled.  Patient otherwise does not report any headaches blurred vision fevers or chills shortness of breath palpitations nausea vomiting diarrhea bleeding or any other complaints.   In the emergency room patient meets sepsis criteria with vitals and source of infection. Blood work shows leukocytosis of 11.4 CMP shows hypokalemia of 3.3, lactic acid was 1 point 0.8 initially and has remained normal. CMP shows normal LFTs otherwise.  Per patient his glucoses always high in the 200s and his diabetes is poorly controlled. Patient's last A1c was 10.5.  Review of Systems: Review of Systems  Cardiovascular:  Positive for chest pain.  Skin:  Positive for rash.   Past Medical History:  Diagnosis  Date   Arrhythmia    Asthma    CCC (chronic calculous cholecystitis)    COPD (chronic obstructive pulmonary disease) (HCC)    Cyst of kidney, acquired    Diabetes mellitus without complication (HCC) 2013   type 2   Diverticulosis    Edentulous    Fatty liver    Gallbladder polyp    GERD (gastroesophageal reflux disease)    Heart murmur    History of chicken pox    History of measles as a child    History of PKU    Hyperlipidemia    Hypertension    IBS (irritable bowel syndrome)    Irregular heart beat    Mentally challenged    Pancreatitis    PONV (postoperative nausea and vomiting)    Stroke (HCC) 03/02/2019   vision issues - right eye/right sided weakness   Vision impairment    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   Past Surgical History:  Procedure Laterality Date   Cardiac Catherization     [facility]   CARDIAC CATHETERIZATION     [facility]   CATARACT EXTRACTION W/PHACO Left 06/05/2021   Procedure: CATARACT EXTRACTION PHACO AND INTRAOCULAR LENS PLACEMENT (IOC) LEFT DIABETIC 3.75 00:28.0;  Surgeon: Nevada Crane, MD;  Location: [facility];  Service: Ophthalmology;  Laterality: Left;  Diabetic   CATARACT EXTRACTION W/PHACO Right 07/10/2021   Procedure: CATARACT EXTRACTION PHACO AND INTRAOCULAR LENS PLACEMENT (IOC) RIGHT DIABETIC;  Surgeon: Nevada Crane, MD;  Location: [facility];  Service: Ophthalmology;  Laterality: Right;  Diabetic 2.66 00:24.4   COLONOSCOPY     COLONOSCOPY WITH PROPOFOL N/A 08/10/2020   Procedure: COLONOSCOPY WITH PROPOFOL;  Surgeon: Toney Reil, MD;  Location: [facility] END[facility]COPY;  Service: Gastroenterology;  Laterality: N/A;  Has ankle monitor; (Parole officer "Ms Ward Givens needs a time before Monday [phone]" - per patient's wife)   ESOPHAGOGASTRODUODENOSCOPY (EGD) WITH PROPOFOL N/A 12/27/2016   Procedure: ESOPHAGOGASTRODUODENOSCOPY (EGD) WITH PROPOFOL;  Surgeon: Toney Reil, MD;  Location: [facility] ENDOSCOPY;   Service: Gastroenterology;  Laterality: N/A;   HEMORRHOID SURGERY     KNEE SURGERY     cyst removed   Ligament Removal Left    of left thumb: dr. Alberteen Spindle   ligament removal  of left thumb     Dr. Alberteen Spindle   NM GATED MYOCARDIAL STUDY (ARMX HX)  06/23/2014   Paraschos. Normal   WISDOM TOOTH EXTRACTION     Social History:  reports that he has never smoked. He has never been exposed to tobacco smoke. He has never used smokeless tobacco. He reports that he does not drink alcohol and does not use drugs.  Allergies  Allergen Reactions   Sulfa Antibiotics Shortness Of Breath and Nausea And Vomiting   Bee Venom Hives and Swelling    All kinds of bees   Morphine Nausea And Vomiting   Other     Certain powders (detergent)    Family History  Problem Relation Age of Onset   Cancer Mother        throat   Diabetes Father    Heart disease Father    Cancer Maternal Grandmother    Cancer Maternal Grandfather        pancreatic   Cancer Paternal Grandfather     Prior to Admission medications   Medication Sig Start Date End Date Taking? Authorizing Provider  atorvastatin (LIPITOR) 80 MG tablet Take 80 mg by mouth daily.   Yes [provider]  clopidogrel (PLAVIX) 75 MG tablet Take 1 tablet (75 mg total) by mouth daily. 05/25/22  Yes Cannady, Jolene T, NP  empagliflozin (JARDIANCE) 25 MG TABS tablet Take 1 tablet (25 mg total) by mouth daily. 05/25/22  Yes Cannady, Jolene T, NP  EPINEPHRINE 0.3 mg/0.3 mL IJ SOAJ injection INJECT 1 SYRINGE INTO OUTER THIGH ONCE AS NEEDED FOR SEVERE ALLERGIC REACTION. Patient taking differently: Inject 0.3 mg into the muscle as needed. 01/29/21  Yes Cannady, Jolene T, NP  fenofibrate 54 MG tablet Take 1 tablet (54 mg total) by mouth daily. 05/25/22  Yes Cannady, Jolene T, NP  fluticasone furoate-vilanterol (BREO ELLIPTA) 100-25 MCG/ACT AEPB Inhale 1 puff into the lungs daily. 05/25/22  Yes Cannady, Jolene T, NP  gabapentin (NEURONTIN) 300 MG capsule Take 1 capsule  (300 mg total) by mouth 2 (two) times daily. 05/25/22  Yes Cannady, Jolene T, NP  icosapent Ethyl (VASCEPA) 1 g capsule Take 1 capsule (1 g total) by mouth 2 (two) times daily. 05/25/22  Yes Cannady, Jolene T, NP  Insulin Degludec (TRESIBA) 100 UNIT/ML SOLN Inject 40 Units into the skin 2 (two) times daily. 05/28/22  Yes Cannady, Jolene T, NP  lisinopril (ZESTRIL) 20 MG tablet Take 1 tablet (20 mg total) by mouth daily. 05/25/22  Yes Cannady, Jolene T, NP  montelukast (SINGULAIR) 10 MG tablet Take 1 tablet (10 mg total) by mouth at bedtime. 05/25/22  Yes Cannady, Jolene T, NP  nitroGLYCERIN (NITROSTAT) 0.4 MG SL tablet Place 1 tablet (0.4 mg total) under the tongue every 5 (five) minutes as needed  for chest pain. 05/25/22  Yes Cannady, Jolene T, NP  omeprazole (PRILOSEC) 20 MG capsule Take 1 capsule (20 mg total) by mouth daily. 05/25/22  Yes Cannady, Jolene T, NP  rosuvastatin (CRESTOR) 40 MG tablet Take 1 tablet (40 mg total) by mouth daily. 05/25/22  Yes Cannady, Jolene T, NP  Vitamin D, Ergocalciferol, (DRISDOL) 1.25 MG (50000 UNIT) CAPS capsule Take 50,000 Units by mouth once a week. 09/04/22  Yes [provider]  albuterol (VENTOLIN HFA) 108 (90 Base) MCG/ACT inhaler Inhale 2 puffs into the lungs every 6 (six) hours as needed. 06/25/22   Aura Dials T, NP  aspirin 81 MG EC tablet Take 1 tablet (81 mg total) by mouth daily. 03/25/19   Cannady, Corrie Dandy T, NP  Blood Glucose Monitoring Suppl (ONE TOUCH ULTRA 2) w/Device KIT Use to check blood sugar 4 times a day 06/26/21   Cannady, Jolene T, NP  glucose blood (ONETOUCH ULTRA) test strip TEST BLOOD SUGAR 5 TIMES A DAY 05/29/22   Cannady, Jolene T, NP  Insulin Disposable Pump (OMNIPOD DASH PDM, GEN 4,) KIT AS PRESCRIBED USE AS DIRECTED BASED ON TOTAL DAILY INSULIN AND POD CHANGE FREQUENCY 01/12/22   [provider]  Insulin Lispro Junior KwikPen (HUMALOG JR) 100 UNIT/ML KwikPen Inject 10 Units into the skin 3 (three) times daily before meals. 06/28/22    [provider]  Insulin Pen Needle (PEN NEEDLES) 32G X 4 MM MISC 1 kit by Does not apply route daily. 09/07/19   Marjie Skiff, NP  Misc. Devices (PULSE OXIMETER FOR FINGER) MISC To check O2 saturations once daily with asthma and document + check if any shortness of breath 06/01/20   Cannady, Corrie Dandy T, NP  mupirocin ointment (BACTROBAN) 2 % Apply 1 Application topically 2 (two) times daily. 05/22/22   Cannady, Dorie Rank, NP  OneTouch Delica Lancets 33G MISC USE AS DIRECTED 09/17/22   Cannady, Jolene T, NP  sitaGLIPtin (JANUVIA) 100 MG tablet Take 1 tablet (100 mg total) by mouth daily. 05/25/22   Cannady, Corrie Dandy T, NP  tiZANidine (ZANAFLEX) 2 MG tablet Take 1 tablet (2 mg total) by mouth every 6 (six) hours as needed for muscle spasms. Patient taking differently: Take 2 mg by mouth every 6 (six) hours. 04/28/21   Cannady, Corrie Dandy T, NP  triamcinolone cream (KENALOG) 0.1 % Apply 1 Application topically 2 (two) times daily. Patient taking differently: Apply 1 Application topically 2 (two) times daily. Arms and legs 02/02/22   Aura Dials T, NP  ULTRACARE PEN NEEDLES 32G X 5 MM MISC  05/25/20   [provider]     Vitals:   10/09/22 0015 10/09/22 0017 10/09/22 0030 10/09/22 0045  BP:      Pulse: 68  60 66  Resp: 14  19 17   Temp:  98.6 F (37 C)    TempSrc:      SpO2: 100%  99% 100%  Weight:      Height:       Physical Exam Vitals and nursing note reviewed.  Constitutional:      General: He is not in acute distress.    Appearance: He is obese.  HENT:     Head: Normocephalic and atraumatic.     Right Ear: Hearing normal.     Left Ear: Hearing normal.     Nose: Nose normal. No nasal deformity.     Mouth/Throat:     Lips: Pink.     Tongue: No lesions.  Pharynx: Oropharynx is clear.  Eyes:     General: Lids are normal.     Extraocular Movements: Extraocular movements intact.  Cardiovascular:     Rate and Rhythm: Normal rate and regular rhythm.     Heart sounds:  Normal heart sounds.  Pulmonary:     Effort: Pulmonary effort is normal.     Breath sounds: Normal breath sounds.  Abdominal:     General: Bowel sounds are normal. There is no distension.     Palpations: Abdomen is soft. There is no mass.     Tenderness: There is no abdominal tenderness.  Musculoskeletal:     Right lower leg: No edema.     Left lower leg: No edema.  Skin:    General: Skin is warm.  Neurological:     General: No focal deficit present.     Mental Status: He is alert and oriented to person, place, and time.     Cranial Nerves: Cranial nerves 2-12 are intact.  Psychiatric:        Attention and Perception: Attention normal.        Mood and Affect: Mood normal.        Speech: Speech normal.        Behavior: Behavior normal. Behavior is cooperative.      Labs on Admission: I have personally reviewed following labs and imaging studies  CBC: Recent Labs  Lab 10/08/22 1806  WBC 11.4*  NEUTROABS 9.3*  HGB 13.4  HCT 39.4  MCV 92.1  PLT 187   Basic Metabolic Panel: Recent Labs  Lab 10/08/22 1850  NA 134*  K 3.3*  CL 103  CO2 24  GLUCOSE 210*  BUN 11  CREATININE 0.93  CALCIUM 8.6*   GFR: Estimated Creatinine Clearance: 80.2 mL/min (by C-G formula based on SCr of 0.93 mg/dL). Liver Function Tests: Recent Labs  Lab 10/08/22 1850  AST 21  ALT 24  ALKPHOS 89  BILITOT 1.0  PROT 7.4  ALBUMIN 4.1   No results for input(s): "LIPASE", "AMYLASE" in the last 168 hours. No results for input(s): "AMMONIA" in the last 168 hours. Coagulation Profile: No results for input(s): "INR", "PROTIME" in the last 168 hours. Cardiac Enzymes: No results for input(s): "CKTOTAL", "CKMB", "CKMBINDEX", "TROPONINI" in the last 168 hours. BNP (last 3 results) No results for input(s): "PROBNP" in the last 8760 hours. HbA1C: No results for input(s): "HGBA1C" in the last 72 hours. CBG: No results for input(s): "GLUCAP" in the last 168 hours. Lipid Profile: No results for  input(s): "CHOL", "HDL", "LDLCALC", "TRIG", "CHOLHDL", "LDLDIRECT" in the last 72 hours. Thyroid Function Tests: No results for input(s): "TSH", "T4TOTAL", "FREET4", "T3FREE", "THYROIDAB" in the last 72 hours. Anemia Panel: No results for input(s): "VITAMINB12", "FOLATE", "FERRITIN", "TIBC", "IRON", "RETICCTPCT" in the last 72 hours. Urine analysis: Urinalysis    Component Value Date/Time   COLORURINE STRAW (A) 11/28/2015 0143   APPEARANCEUR Clear 12/30/2019 1442   LABSPEC 1.005 11/28/2015 0143   PHURINE 7.0 11/28/2015 0143   GLUCOSEU Negative 12/30/2019 1442   HGBUR NEGATIVE 11/28/2015 0143   BILIRUBINUR Negative 12/30/2019 1442   KETONESUR NEGATIVE 11/28/2015 0143   PROTEINUR Negative 12/30/2019 1442   PROTEINUR NEGATIVE 11/28/2015 0143   UROBILINOGEN 1.0 10/31/2017 0937   NITRITE Negative 12/30/2019 1442   NITRITE NEGATIVE 11/28/2015 0143   LEUKOCYTESUR Negative 12/30/2019 1442    Unresulted Labs (From admission, onward)     Start     Ordered   10/09/22 0500  CBC  Once,   R        10/09/22 0500   10/09/22 0500  Comprehensive metabolic panel  Once,   R        10/09/22 0500   10/09/22 0500  CBC  Once,   R        10/09/22 0500   10/09/22 0500  Comprehensive metabolic panel  Once,   R        10/09/22 0500   10/09/22 0158  Hemoglobin A1c  Once,   R       Comments: To assess prior glycemic control    10/09/22 0157   10/09/22 0130  MRSA culture  Once,   R        10/09/22 0129   10/09/22 0129  D-dimer, quantitative  Once,   R        10/09/22 0128   10/09/22 0039  HIV Antibody (routine testing w rflx)  (HIV Antibody (Routine testing w reflex) panel)  Once,   R        10/09/22 0046   10/08/22 1750  CBC with Differential  Once,   STAT        10/08/22 1749             Radiological Exams on Admission: DG Chest 2 View  Result Date: 10/08/2022 CLINICAL DATA:  Chest pain. EXAM: CHEST - 2 VIEW COMPARISON:  Chest radiograph dated 11/27/2015. FINDINGS: Mild bilateral  perihilar and left lung base interstitial densities similar to prior radiograph and may be chronic. No consolidative changes. There is no pleural effusion or pneumothorax. The cardiac silhouette is within normal limits. No acute osseous pathology. IMPRESSION: No active cardiopulmonary disease. Electronically Signed   By: Elgie Collard M.D.   On: 10/08/2022 22:47   CT Soft Tissue Neck W Contrast  Result Date: 10/08/2022 CLINICAL DATA:  Initial evaluation for soft tissue infection. EXAM: CT NECK WITH CONTRAST TECHNIQUE: Multidetector CT imaging of the neck was performed using the standard protocol following the bolus administration of intravenous contrast. RADIATION DOSE REDUCTION: This exam was performed according to the departmental dose-optimization program which includes automated exposure control, adjustment of the mA and/or kV according to patient size and/or use of iterative reconstruction technique. CONTRAST:  75mL OMNIPAQUE IOHEXOL 300 MG/ML  SOLN COMPARISON:  None Available. FINDINGS: Pharynx and larynx: Oral cavity within normal limits. Oropharynx and nasopharynx normal. No retropharyngeal collection. Negative epiglottis. Hypopharynx and supraglottic larynx within normal limits. Negative glottis. Subglottic airway clear. Salivary glands: Salivary glands including the parotid and submandibular glands within normal limits. Thyroid: Normal. Lymph nodes: Few mildly prominent subcentimeter right posterior chain lymph nodes, presumably reactive. No other enlarged or pathologic adenopathy. Vascular: Normal intravascular enhancement seen within the neck. Mild atheromatous change about the carotid bifurcations. Limited intracranial: Unremarkable. Visualized orbits: Unremarkable. Mastoids and visualized paranasal sinuses: Visualized paranasal sinuses are clear. Visualized mastoids and middle ear cavities well pneumatized and free of fluid. Skeleton: No discrete or worrisome osseous lesions. Mild spondylosis  present at C5-6 and C6-7. Osteoarthritic changes present about the C1-2 articulations. Patient is edentulous. Upper chest: No acute finding. Other: Diffuse soft tissue swelling with inflammatory stranding present within the subcutaneous fat of the right upper back, concerning for infection/cellulitis (series 2, image 48). Changes involve the subcutaneous fat as well as a portion of the underlying right trapezius muscle. No discrete abscess or drainable fluid collection. Superimposed 1.3 cm hyperdensity at this location, indeterminate, and possibly related to prior intervention. Foreign body not excluded (series 5,  image 30). IMPRESSION: 1. Diffuse soft tissue swelling with inflammatory stranding within the subcutaneous fat of the right upper back, concerning for infection/cellulitis. No discrete abscess or drainable fluid collection. Superimposed 1.3 cm hyperdensity at this location, indeterminate, and possibly related to prior intervention. Foreign body not excluded. 2. No other acute abnormality within the neck. Electronically Signed   By: Rise Mu M.D.   On: 10/08/2022 22:02     Data Reviewed: Relevant notes from primary care and specialist visits, past discharge summaries as available in EHR, including Care Everywhere. Prior diagnostic testing as pertinent to current admission diagnoses Updated medications and problem lists for reconciliation ED course, including vitals, labs, imaging, treatment and response to treatment Triage notes, nursing and pharmacy notes and ED provider's notes Notable results as noted in HPI Assessment and Plan: * Cellulitis and abscess of trunk Cellulitis and abscess on the right trapezius status post I&D in the emergency room. Wound cultures collected in the emergency room.  Lactic acid is pending.  Patient meets sepsis criteria and was given vancomycin and Rocephin.  Will add Flagyl for anaerobic coverage as patient is a poorly controlled diabetic.  ceFEPime  (MAXIPIME) IV     lactated ringers     metronidazole 500 mg (10/09/22 0145)   vancomycin       Angina pectoris (HCC) While waiting for his treatment plan for his abscess patient started to develop right-sided chest pain.  EKG negative for any acute ST-T wave changes.  Troponin negative today.  Patient states that his chest pain has been going off and on for a while now and he sees Dr. Adrian Blackwater for his heart for irregular heart beat and he has told dr.kahn of his chest pain and  is to have stress test. Cardiology consult for chest pain.  Patient continued on aspirin 81, plavix 75 ,atorvastatin, lisinopril.  Hydralazine for blood pressure.   Sepsis [facility]) Patient meets sepsis criteria were waiting for lactic acid suspect severe sepsis. Vitals are stable. Will continue broad-spectrum IV antibiotics until wound culture and blood cultures come back. MIVF with LR at 40 cc/hour 12 hours.    Hypokalemia Replace and follow.   Type 2 diabetes mellitus with proteinuria (HCC) Poorly controlled diabetes last A1c seen 8.6.  Repeat A1c pending. Home treatment of Jardiance, insulin, lispro, Januvia held.   Plan with glycemic protocol management while in the hospital.  Atherosclerosis of both carotid arteries Pt was seen by vascular today am itself for carotid stenosis.  Plz see note in chart  for A/P.   History of CVA (cerebrovascular accident) Will continue patient on aspirin 81 along with statin therapy.   Frequent falls Fall precaution.   GERD (gastroesophageal reflux disease) IV PPI therapy.   Hypertension associated with diabetes (HCC) Vitals:   10/08/22 1546 10/08/22 2133 10/08/22 2135 10/08/22 2235  BP: (!) 157/84 (!) 161/77 (!) 161/77 (!) 164/81   10/08/22 2236 10/08/22 2255  BP: (!) 164/81 (!) 143/74  Home regimen consists of lisinopril 20.  Will continue the same patient's creatinine is within normal limits at 0.93.    DVT prophylaxis:  Heparin single dose as pt is  on DAPT.  Consults:  Cardiology.   Advance Care Planning:    Code Status: Full Code   Family Communication:  Wife at bedside.  Disposition Plan:  Back to previous home environment  Severity of Illness: The appropriate patient status for this patient is INPATIENT. Inpatient status is judged to be reasonable and necessary in order  to provide the required intensity of service to ensure the patient's safety. The patient's presenting symptoms, physical exam findings, and initial radiographic and laboratory data in the context of their chronic comorbidities is felt to place them at high risk for further clinical deterioration. Furthermore, it is not anticipated that the patient will be medically stable for discharge from the hospital within 2 midnights of admission.   * I certify that at the point of admission it is my clinical judgment that the patient will require inpatient hospital care spanning beyond 2 midnights from the point of admission due to high intensity of service, high risk for further deterioration and high frequency of surveillance required.*  Author: Gertha Calkin, MD 10/09/2022 2:05 AM  For on call review [email or web address].

## 2022-10-08 NOTE — ED Notes (Signed)
Pt given drink, wife at bedside, new RN given report.

## 2022-10-08 NOTE — H&P (Incomplete)
History and Physical    Patient: [name]. NWG:[id number] DOB: Sep 17, 1960 DOA: 10/08/2022 DOS: the patient was seen and examined on 10/08/2022 PCP: Marjie Skiff, NP  Patient coming from: {Point_of_Origin:[address]}   Chief Complaint:  Chief Complaint  Patient presents with  . Abscess    HPI: [name]. is a 62 y.o. male with medical history significant for ***   Review of Systems: ROS   Past Medical History:  Diagnosis Date  . Arrhythmia   . Asthma   . CCC (chronic calculous cholecystitis)   . COPD (chronic obstructive pulmonary disease) (HCC)   . Cyst of kidney, acquired   . Diabetes mellitus without complication (HCC) 2013   type 2  . Diverticulosis   . Edentulous   . Fatty liver   . Gallbladder polyp   . GERD (gastroesophageal reflux disease)   . Heart murmur   . History of chicken pox   . History of measles as a child   . History of PKU   . Hyperlipidemia   . Hypertension   . IBS (irritable bowel syndrome)   . Irregular heart beat   . Mentally challenged   . Pancreatitis   . PONV (postoperative nausea and vomiting)   . Stroke (HCC) 03/02/2019   vision issues - right eye/right sided weakness  . 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 Cardiac Catherization     [facility]  . CARDIAC CATHETERIZATION     [facility]  . CATARACT EXTRACTION W/PHACO Left 06/05/2021   Procedure: CATARACT EXTRACTION PHACO AND INTRAOCULAR LENS PLACEMENT (IOC) LEFT DIABETIC 3.75 00:28.0;  Surgeon: Nevada Crane, MD;  Location: [facility];  Service: Ophthalmology;  Laterality: Left;  Diabetic  . CATARACT EXTRACTION W/PHACO Right 07/10/2021   Procedure: CATARACT EXTRACTION PHACO AND INTRAOCULAR LENS PLACEMENT (IOC) RIGHT DIABETIC;  Surgeon: Nevada Crane, MD;  Location: [facility];  Service: Ophthalmology;  Laterality: Right;  Diabetic 2.66 00:24.4  . COLONOSCOPY     . COLONOSCOPY WITH PROPOFOL N/A 08/10/2020   Procedure: COLONOSCOPY WITH PROPOFOL;  Surgeon: Toney Reil, MD;  Location: [facility];  Service: Gastroenterology;  Laterality: N/A;  Has ankle monitor; (Parole officer "Ms Ward Givens needs a time before Monday ([phone]" - per patient's wife)  . ESOPHAGOGASTRODUODENOSCOPY (EGD) WITH PROPOFOL N/A 12/27/2016   Procedure: ESOPHAGOGASTRODUODENOSCOPY (EGD) WITH PROPOFOL;  Surgeon: Toney Reil, MD;  Location: [facility];  Service: Gastroenterology;  Laterality: N/A;  . HEMORRHOID SURGERY    . KNEE SURGERY     cyst removed  . Ligament Removal Left    of left thumb: dr. Alberteen Spindle  . ligament removal  of left thumb     Dr. Alberteen Spindle  . NM GATED MYOCARDIAL STUDY (ARMX HX)  06/23/2014   Paraschos. Normal  . WISDOM TOOTH EXTRACTION     Social History:  reports that he has never smoked. He has never been exposed to tobacco smoke. He has never used smokeless tobacco. He reports that he does not drink alcohol and does not use drugs.  Allergies  Allergen Reactions  . Sulfa Antibiotics Shortness Of Breath and Nausea And Vomiting  . Bee Venom Hives and Swelling    All kinds of bees  . Morphine Nausea And Vomiting  . Other     Certain powders    Family History  Problem Relation Age of Onset  . Cancer Mother  throat  . Diabetes Father   . Heart disease Father   . Cancer Maternal Grandmother   . Cancer Maternal Grandfather        pancreatic  . Cancer Paternal Grandfather     Prior to Admission medications   Medication Sig Start Date End Date Taking? Authorizing Provider  atorvastatin (LIPITOR) 80 MG tablet Take 80 mg by mouth daily.   Yes [provider]  clopidogrel (PLAVIX) 75 MG tablet Take 1 tablet (75 mg total) by mouth daily. 05/25/22  Yes Cannady, Jolene T, NP  empagliflozin (JARDIANCE) 25 MG TABS tablet Take 1 tablet (25 mg total) by mouth daily. 05/25/22  Yes Cannady, Jolene T, NP  EPINEPHRINE 0.3 mg/0.3 mL  IJ SOAJ injection INJECT 1 SYRINGE INTO OUTER THIGH ONCE AS NEEDED FOR SEVERE ALLERGIC REACTION. Patient taking differently: Inject 0.3 mg into the muscle as needed. 01/29/21  Yes Cannady, Jolene T, NP  fenofibrate 54 MG tablet Take 1 tablet (54 mg total) by mouth daily. 05/25/22  Yes Cannady, Jolene T, NP  fluticasone furoate-vilanterol (BREO ELLIPTA) 100-25 MCG/ACT AEPB Inhale 1 puff into the lungs daily. 05/25/22  Yes Cannady, Jolene T, NP  gabapentin (NEURONTIN) 300 MG capsule Take 1 capsule (300 mg total) by mouth 2 (two) times daily. 05/25/22  Yes Cannady, Jolene T, NP  icosapent Ethyl (VASCEPA) 1 g capsule Take 1 capsule (1 g total) by mouth 2 (two) times daily. 05/25/22  Yes Cannady, Jolene T, NP  Insulin Degludec (TRESIBA) 100 UNIT/ML SOLN Inject 40 Units into the skin 2 (two) times daily. 05/28/22  Yes Cannady, Jolene T, NP  lisinopril (ZESTRIL) 20 MG tablet Take 1 tablet (20 mg total) by mouth daily. 05/25/22  Yes Cannady, Jolene T, NP  montelukast (SINGULAIR) 10 MG tablet Take 1 tablet (10 mg total) by mouth at bedtime. 05/25/22  Yes Cannady, Jolene T, NP  nitroGLYCERIN (NITROSTAT) 0.4 MG SL tablet Place 1 tablet (0.4 mg total) under the tongue every 5 (five) minutes as needed for chest pain. 05/25/22  Yes Cannady, Jolene T, NP  omeprazole (PRILOSEC) 20 MG capsule Take 1 capsule (20 mg total) by mouth daily. 05/25/22  Yes Cannady, Jolene T, NP  rosuvastatin (CRESTOR) 40 MG tablet Take 1 tablet (40 mg total) by mouth daily. 05/25/22  Yes Cannady, Jolene T, NP  Vitamin D, Ergocalciferol, (DRISDOL) 1.25 MG (50000 UNIT) CAPS capsule Take 50,000 Units by mouth once a week. 09/04/22  Yes [provider]  albuterol (VENTOLIN HFA) 108 (90 Base) MCG/ACT inhaler Inhale 2 puffs into the lungs every 6 (six) hours as needed. 06/25/22   Aura Dials T, NP  aspirin 81 MG EC tablet Take 1 tablet (81 mg total) by mouth daily. 03/25/19   Cannady, Corrie Dandy T, NP  Blood Glucose Monitoring Suppl (ONE TOUCH ULTRA 2) w/Device  KIT Use to check blood sugar 4 times a day 06/26/21   Cannady, Jolene T, NP  glucose blood (ONETOUCH ULTRA) test strip TEST BLOOD SUGAR 5 TIMES A DAY 05/29/22   Cannady, Jolene T, NP  Insulin Disposable Pump (OMNIPOD DASH PDM, GEN 4,) KIT AS PRESCRIBED USE AS DIRECTED BASED ON TOTAL DAILY INSULIN AND POD CHANGE FREQUENCY 01/12/22   [provider]  Insulin Lispro Junior KwikPen (HUMALOG JR) 100 UNIT/ML KwikPen Inject 10 Units into the skin 3 (three) times daily before meals. 06/28/22   [provider]  Insulin Pen Needle (PEN NEEDLES) 32G X 4 MM MISC 1 kit by Does not apply route daily. 09/07/19  Marjie Skiff, NP  Misc. Devices (PULSE OXIMETER FOR FINGER) MISC To check O2 saturations once daily with asthma and document + check if any shortness of breath 06/01/20   Cannady, Corrie Dandy T, NP  mupirocin ointment (BACTROBAN) 2 % Apply 1 Application topically 2 (two) times daily. 05/22/22   Cannady, Dorie Rank, NP  OneTouch Delica Lancets 33G MISC USE AS DIRECTED 09/17/22   Cannady, Jolene T, NP  sitaGLIPtin (JANUVIA) 100 MG tablet Take 1 tablet (100 mg total) by mouth daily. 05/25/22   Cannady, Corrie Dandy T, NP  tiZANidine (ZANAFLEX) 2 MG tablet Take 1 tablet (2 mg total) by mouth every 6 (six) hours as needed for muscle spasms. Patient taking differently: Take 2 mg by mouth every 6 (six) hours. 04/28/21   Cannady, Corrie Dandy T, NP  triamcinolone cream (KENALOG) 0.1 % Apply 1 Application topically 2 (two) times daily. Patient taking differently: Apply 1 Application topically 2 (two) times daily. Arms and legs 02/02/22   Aura Dials T, NP  ULTRACARE PEN NEEDLES 32G X 5 MM MISC  05/25/20   [provider]     Vitals:   10/08/22 2135 10/08/22 2235 10/08/22 2236 10/08/22 2255  BP: (!) 161/77 (!) 164/81 (!) 164/81 (!) 143/74  Pulse: 64 67  72  Resp:  20  (!) 21  Temp:      TempSrc:      SpO2: 95% 96%  96%  Weight:      Height:       Physical Exam   Labs on Admission: I have personally  reviewed following labs and imaging studies  CBC: Recent Labs  Lab 10/08/22 1806  WBC 11.4*  NEUTROABS 9.3*  HGB 13.4  HCT 39.4  MCV 92.1  PLT 187   Basic Metabolic Panel: Recent Labs  Lab 10/08/22 1850  NA 134*  K 3.3*  CL 103  CO2 24  GLUCOSE 210*  BUN 11  CREATININE 0.93  CALCIUM 8.6*   GFR: Estimated Creatinine Clearance: 80.2 mL/min (by C-G formula based on SCr of 0.93 mg/dL). Liver Function Tests: Recent Labs  Lab 10/08/22 1850  AST 21  ALT 24  ALKPHOS 89  BILITOT 1.0  PROT 7.4  ALBUMIN 4.1   No results for input(s): "LIPASE", "AMYLASE" in the last 168 hours. No results for input(s): "AMMONIA" in the last 168 hours. Coagulation Profile: No results for input(s): "INR", "PROTIME" in the last 168 hours. Cardiac Enzymes: No results for input(s): "CKTOTAL", "CKMB", "CKMBINDEX", "TROPONINI" in the last 168 hours. BNP (last 3 results) No results for input(s): "PROBNP" in the last 8760 hours. HbA1C: No results for input(s): "HGBA1C" in the last 72 hours. CBG: No results for input(s): "GLUCAP" in the last 168 hours. Lipid Profile: No results for input(s): "CHOL", "HDL", "LDLCALC", "TRIG", "CHOLHDL", "LDLDIRECT" in the last 72 hours. Thyroid Function Tests: No results for input(s): "TSH", "T4TOTAL", "FREET4", "T3FREE", "THYROIDAB" in the last 72 hours. Anemia Panel: No results for input(s): "VITAMINB12", "FOLATE", "FERRITIN", "TIBC", "IRON", "RETICCTPCT" in the last 72 hours. Urine analysis: Urinalysis    Component Value Date/Time   COLORURINE STRAW (A) 11/28/2015 0143   APPEARANCEUR Clear 12/30/2019 1442   LABSPEC 1.005 11/28/2015 0143   PHURINE 7.0 11/28/2015 0143   GLUCOSEU Negative 12/30/2019 1442   HGBUR NEGATIVE 11/28/2015 0143   BILIRUBINUR Negative 12/30/2019 1442   KETONESUR NEGATIVE 11/28/2015 0143   PROTEINUR Negative 12/30/2019 1442   PROTEINUR NEGATIVE 11/28/2015 0143   UROBILINOGEN 1.0 10/31/2017 0937   NITRITE Negative 12/30/2019 1442  NITRITE NEGATIVE 11/28/2015 0143   LEUKOCYTESUR Negative 12/30/2019 1442    Unresulted Labs (From admission, onward)     Start     Ordered   10/08/22 1750  CBC with Differential  Once,   STAT        10/08/22 1749   10/08/22 1750  Lactic acid, plasma  Now then every 2 hours,   STAT      10/08/22 1749             Radiological Exams on Admission: DG Chest 2 View  Result Date: 10/08/2022 CLINICAL DATA:  Chest pain. EXAM: CHEST - 2 VIEW COMPARISON:  Chest radiograph dated 11/27/2015. FINDINGS: Mild bilateral perihilar and left lung base interstitial densities similar to prior radiograph and may be chronic. No consolidative changes. There is no pleural effusion or pneumothorax. The cardiac silhouette is within normal limits. No acute osseous pathology. IMPRESSION: No active cardiopulmonary disease. Electronically Signed   By: Elgie Collard M.D.   On: 10/08/2022 22:47   CT Soft Tissue Neck W Contrast  Result Date: 10/08/2022 CLINICAL DATA:  Initial evaluation for soft tissue infection. EXAM: CT NECK WITH CONTRAST TECHNIQUE: Multidetector CT imaging of the neck was performed using the standard protocol following the bolus administration of intravenous contrast. RADIATION DOSE REDUCTION: This exam was performed according to the departmental dose-optimization program which includes automated exposure control, adjustment of the mA and/or kV according to patient size and/or use of iterative reconstruction technique. CONTRAST:  75mL OMNIPAQUE IOHEXOL 300 MG/ML  SOLN COMPARISON:  None Available. FINDINGS: Pharynx and larynx: Oral cavity within normal limits. Oropharynx and nasopharynx normal. No retropharyngeal collection. Negative epiglottis. Hypopharynx and supraglottic larynx within normal limits. Negative glottis. Subglottic airway clear. Salivary glands: Salivary glands including the parotid and submandibular glands within normal limits. Thyroid: Normal. Lymph nodes: Few mildly prominent  subcentimeter right posterior chain lymph nodes, presumably reactive. No other enlarged or pathologic adenopathy. Vascular: Normal intravascular enhancement seen within the neck. Mild atheromatous change about the carotid bifurcations. Limited intracranial: Unremarkable. Visualized orbits: Unremarkable. Mastoids and visualized paranasal sinuses: Visualized paranasal sinuses are clear. Visualized mastoids and middle ear cavities well pneumatized and free of fluid. Skeleton: No discrete or worrisome osseous lesions. Mild spondylosis present at C5-6 and C6-7. Osteoarthritic changes present about the C1-2 articulations. Patient is edentulous. Upper chest: No acute finding. Other: Diffuse soft tissue swelling with inflammatory stranding present within the subcutaneous fat of the right upper back, concerning for infection/cellulitis (series 2, image 48). Changes involve the subcutaneous fat as well as a portion of the underlying right trapezius muscle. No discrete abscess or drainable fluid collection. Superimposed 1.3 cm hyperdensity at this location, indeterminate, and possibly related to prior intervention. Foreign body not excluded (series 5, image 30). IMPRESSION: 1. Diffuse soft tissue swelling with inflammatory stranding within the subcutaneous fat of the right upper back, concerning for infection/cellulitis. No discrete abscess or drainable fluid collection. Superimposed 1.3 cm hyperdensity at this location, indeterminate, and possibly related to prior intervention. Foreign body not excluded. 2. No other acute abnormality within the neck. Electronically Signed   By: Rise Mu M.D.   On: 10/08/2022 22:02     Data Reviewed: Relevant notes from primary care and specialist visits, past discharge summaries as available in EHR, including Care Everywhere. Prior diagnostic testing as pertinent to current admission diagnoses Updated medications and problem lists for reconciliation ED course, including  vitals, labs, imaging, treatment and response to treatment Triage notes, nursing and pharmacy notes and ED  provider's notes Notable results as noted in HPI Assessment and Plan: No notes have been filed under this hospital service. Service: Hospitalist       DVT prophylaxis:  ***  Consults:  ***  Advance Care Planning:    Code Status: Prior *** Family Communication:  *** Disposition Plan:  Back to previous home environment  Severity of Illness: {Observation/Inpatient:21159}  Author: Gertha Calkin, MD 10/08/2022 11:18 PM  For on call review [email or web address].

## 2022-10-08 NOTE — ED Provider Notes (Signed)
[facility] Provider Note  Patient Contact: 5:51 PM (approximate)   History   Abscess   HPI  [name]. is a 62 y.o. male presents to the emergency department with a large abscess of the right upper trapezius which has surrounding induration and some very mild spontaneous drainage for the past 3 weeks.  Patient denies fever and chills but states that he has felt weak at home.  He denies chest pain, chest tightness or abdominal pain.  No numbness or tingling in the upper extremities.      Physical Exam   Triage Vital Signs: ED Triage Vitals [10/08/22 1546]  Encounter Vitals Group     BP (!) 157/84     Systolic BP Percentile      Diastolic BP Percentile      Pulse Rate 71     Resp 18     Temp 98.6 F (37 C)     Temp src      SpO2 98 %     Weight 169 lb 12.1 oz (77 kg)     Height 5\' 4"  (1.626 m)     Head Circumference      Peak Flow      Pain Score 10     Pain Loc      Pain Education      Exclude from Growth Chart     Most recent vital signs: Vitals:   10/08/22 2236 10/08/22 2255  BP: (!) 164/81 (!) 143/74  Pulse:  72  Resp:  (!) 21  Temp:    SpO2:  96%     General: Alert and in no acute distress. Eyes:  PERRL. EOMI. Head: No acute traumatic findings ENT:      Nose: No congestion/rhinnorhea.      Mouth/Throat: Mucous membranes are moist. Neck: No stridor. No cervical spine tenderness to palpation.  Patient has induration along the posterior aspect of his right neck and skin overlying right upper trapezius.  Patient does have tenderness to palpation along the lateral aspect of his neck with deep palpation. Cardiovascular:  Good peripheral perfusion Respiratory: Normal respiratory effort without tachypnea or retractions. Lungs CTAB. Good air entry to the bases with no decreased or absent breath sounds. Gastrointestinal: Bowel sounds 4 quadrants. Soft and nontender to palpation. No guarding or rigidity. No palpable masses.  No distention. No CVA tenderness. Musculoskeletal: Full range of motion to all extremities.  Neurologic:  No gross focal neurologic deficits are appreciated.  Skin:   No rash noted    ED Results / Procedures / Treatments   Labs (all labs ordered are listed, but only abnormal results are displayed) Labs Reviewed  CBC WITH DIFFERENTIAL/PLATELET - Abnormal; Notable for the following components:      Result Value   WBC 11.4 (*)    Neutro Abs 9.3 (*)    All other components within normal limits  COMPREHENSIVE METABOLIC PANEL - Abnormal; Notable for the following components:   Sodium 134 (*)    Potassium 3.3 (*)    Glucose, Bld 210 (*)    Calcium 8.6 (*)    All other components within normal limits  LACTIC ACID, PLASMA  CBC WITH DIFFERENTIAL/PLATELET  LACTIC ACID, PLASMA  TROPONIN I (HIGH SENSITIVITY)        RADIOLOGY  I personally viewed and evaluated these images as part of my medical decision making, as well as reviewing the written report by the radiologist.  ED Provider Interpretation: CT soft tissue  neck does not indicate drainable fluid collection.  Chest x-ray unremarkable.   PROCEDURES:  Critical Care performed: No  ..Incision and Drainage  Date/Time: 10/08/2022 11:06 PM  Performed by: Orvil Feil, PA-C Authorized by: Orvil Feil, PA-C   Consent:    Consent obtained:  Verbal   Consent given by:  Patient   Risks discussed:  Bleeding, infection, incomplete drainage and pain   Alternatives discussed:  Alternative treatment, delayed treatment and observation Location:    Type:  Abscess Anesthesia:    Anesthesia method:  Local infiltration   Local anesthetic:  Lidocaine 1% w/o epi Procedure type:    Complexity:  Complex Procedure details:    Incision types:  Stab incision   Wound management:  Probed and deloculated   Drainage:  Purulent   Packing materials:  1/4 in gauze Post-procedure details:    Procedure completion:  Tolerated well, no  immediate complications    MEDICATIONS ORDERED IN ED: Medications  vancomycin (VANCOREADY) IVPB 1750 mg/350 mL (1,750 mg Intravenous New Bag/Given 10/08/22 2303)  lidocaine (XYLOCAINE) 1 % (with pres) injection 10 mL (10 mLs Infiltration Given 10/08/22 1747)  iohexol (OMNIPAQUE) 300 MG/ML solution 75 mL (75 mLs Intravenous Contrast Given 10/08/22 2102)  cefTRIAXone (ROCEPHIN) 1 g in sodium chloride 0.9 % 100 mL IVPB (0 g Intravenous Stopped 10/08/22 2303)  nitroGLYCERIN (NITROSTAT) SL tablet 0.4 mg (0.4 mg Sublingual Given 10/08/22 2237)     IMPRESSION / MDM / ASSESSMENT AND PLAN / ED COURSE  I reviewed the triage vital signs and the nursing notes.                              Assessment and plan: Abscess:  Cellulitis 62 year old male presents to the emergency department with cellulitis of the skin overlying the right upper trapezius with extension of induration to the skin overlying the right lateral neck.  Vital signs were reassuring at triage but patient developed a fever while undergoing workup in the emergency department.  CBC indicated mildly elevated white blood cell count.  CMP largely within reference range.  Lactic within range.  Troponin within range.  CT soft tissue neck indicated no drainable fluid collection.  CT findings suggested cellulitis.  Bedside incision and drainage was conducted in the emergency department which did yield some purulent exudate.  Given development of fever while in the emergency department with severe cellulitis, will consider admitting patient.  He is receptive to this plan.  While undergoing workup in the emergency department, patient did complain of chest pain.  An EKG was obtained which indicated normal sinus rhythm with no ST segment elevation or other apparent arrhythmia.  Initial troponin was within range and chest x-ray was unremarkable.  Rocephin and vancomycin was given in the emergency department and patient was accepted for admission under  the care of [name].  FINAL CLINICAL IMPRESSION(S) / ED DIAGNOSES   Final diagnoses:  Abscess  Cellulitis, unspecified cellulitis site     Rx / DC Orders   ED Discharge Orders     None        Note:  This document was prepared using Dragon voice recognition software and may include unintentional dictation errors.   Pia Mau [place], Cordelia Poche 10/08/22 2309    Jene Every, MD 10/11/22 [phone]

## 2022-10-08 NOTE — ED Triage Notes (Signed)
Pt to ED for abscess to right posterior neck for weeks. Reports worsening pain. Drainage noted.

## 2022-10-09 DIAGNOSIS — I2510 Atherosclerotic heart disease of native coronary artery without angina pectoris: Secondary | ICD-10-CM | POA: Diagnosis present

## 2022-10-09 DIAGNOSIS — B948 Sequelae of other specified infectious and parasitic diseases: Secondary | ICD-10-CM | POA: Diagnosis not present

## 2022-10-09 DIAGNOSIS — H538 Other visual disturbances: Secondary | ICD-10-CM | POA: Diagnosis present

## 2022-10-09 DIAGNOSIS — A419 Sepsis, unspecified organism: Secondary | ICD-10-CM | POA: Diagnosis present

## 2022-10-09 DIAGNOSIS — J4489 Other specified chronic obstructive pulmonary disease: Secondary | ICD-10-CM | POA: Diagnosis present

## 2022-10-09 DIAGNOSIS — Z794 Long term (current) use of insulin: Secondary | ICD-10-CM | POA: Diagnosis not present

## 2022-10-09 DIAGNOSIS — A4102 Sepsis due to Methicillin resistant Staphylococcus aureus: Secondary | ICD-10-CM | POA: Diagnosis present

## 2022-10-09 DIAGNOSIS — Z8249 Family history of ischemic heart disease and other diseases of the circulatory system: Secondary | ICD-10-CM | POA: Diagnosis not present

## 2022-10-09 DIAGNOSIS — L02212 Cutaneous abscess of back [any part, except buttock]: Secondary | ICD-10-CM | POA: Diagnosis present

## 2022-10-09 DIAGNOSIS — L02219 Cutaneous abscess of trunk, unspecified: Secondary | ICD-10-CM | POA: Diagnosis present

## 2022-10-09 DIAGNOSIS — K219 Gastro-esophageal reflux disease without esophagitis: Secondary | ICD-10-CM | POA: Diagnosis present

## 2022-10-09 DIAGNOSIS — I152 Hypertension secondary to endocrine disorders: Secondary | ICD-10-CM | POA: Diagnosis present

## 2022-10-09 DIAGNOSIS — L0390 Cellulitis, unspecified: Secondary | ICD-10-CM | POA: Diagnosis present

## 2022-10-09 DIAGNOSIS — I6523 Occlusion and stenosis of bilateral carotid arteries: Secondary | ICD-10-CM | POA: Diagnosis present

## 2022-10-09 DIAGNOSIS — Z79899 Other long term (current) drug therapy: Secondary | ICD-10-CM | POA: Diagnosis not present

## 2022-10-09 DIAGNOSIS — E1169 Type 2 diabetes mellitus with other specified complication: Secondary | ICD-10-CM | POA: Diagnosis present

## 2022-10-09 DIAGNOSIS — L0291 Cutaneous abscess, unspecified: Principal | ICD-10-CM | POA: Diagnosis present

## 2022-10-09 DIAGNOSIS — E1165 Type 2 diabetes mellitus with hyperglycemia: Secondary | ICD-10-CM | POA: Diagnosis present

## 2022-10-09 DIAGNOSIS — R296 Repeated falls: Secondary | ICD-10-CM | POA: Diagnosis present

## 2022-10-09 DIAGNOSIS — E876 Hypokalemia: Secondary | ICD-10-CM | POA: Diagnosis present

## 2022-10-09 DIAGNOSIS — R531 Weakness: Secondary | ICD-10-CM | POA: Diagnosis present

## 2022-10-09 DIAGNOSIS — E785 Hyperlipidemia, unspecified: Secondary | ICD-10-CM | POA: Diagnosis present

## 2022-10-09 DIAGNOSIS — L03312 Cellulitis of back [any part except buttock]: Secondary | ICD-10-CM | POA: Diagnosis present

## 2022-10-09 DIAGNOSIS — L03319 Cellulitis of trunk, unspecified: Secondary | ICD-10-CM | POA: Diagnosis not present

## 2022-10-09 DIAGNOSIS — R079 Chest pain, unspecified: Secondary | ICD-10-CM | POA: Diagnosis present

## 2022-10-09 DIAGNOSIS — E1129 Type 2 diabetes mellitus with other diabetic kidney complication: Secondary | ICD-10-CM | POA: Diagnosis present

## 2022-10-09 DIAGNOSIS — E669 Obesity, unspecified: Secondary | ICD-10-CM | POA: Diagnosis present

## 2022-10-09 DIAGNOSIS — R7989 Other specified abnormal findings of blood chemistry: Secondary | ICD-10-CM | POA: Diagnosis present

## 2022-10-09 DIAGNOSIS — Z7984 Long term (current) use of oral hypoglycemic drugs: Secondary | ICD-10-CM | POA: Diagnosis not present

## 2022-10-09 LAB — HIV ANTIBODY (ROUTINE TESTING W REFLEX): HIV Screen 4th Generation wRfx: NONREACTIVE

## 2022-10-09 LAB — LACTIC ACID, PLASMA: Lactic Acid, Venous: 1.6 mmol/L (ref 0.5–1.9)

## 2022-10-09 LAB — COMPREHENSIVE METABOLIC PANEL
ALT: 21 U/L (ref 0–44)
ALT: 21 U/L (ref 0–44)
AST: 18 U/L (ref 15–41)
AST: 21 U/L (ref 15–41)
Albumin: 3.7 g/dL (ref 3.5–5.0)
Albumin: 3.7 g/dL (ref 3.5–5.0)
Alkaline Phosphatase: 82 U/L (ref 38–126)
Alkaline Phosphatase: 81 U/L (ref 38–126)
Anion gap: 10 (ref 5–15)
Anion gap: 8 (ref 5–15)
BUN: 11 mg/dL (ref 6–20)
BUN: 11 mg/dL (ref 6–20)
CO2: 21 mmol/L — ABNORMAL LOW (ref 22–32)
CO2: 24 mmol/L (ref 22–32)
Calcium: 8.4 mg/dL — ABNORMAL LOW (ref 8.9–10.3)
Calcium: 8.6 mg/dL — ABNORMAL LOW (ref 8.9–10.3)
Chloride: 104 mmol/L (ref 98–111)
Chloride: 105 mmol/L (ref 98–111)
Creatinine, Ser: 0.88 mg/dL (ref 0.61–1.24)
Creatinine, Ser: 0.86 mg/dL (ref 0.61–1.24)
GFR, Estimated: >60 mL/min (ref >60)
GFR, Estimated: >60 mL/min (ref >60)
Glucose, Bld: 195 mg/dL — ABNORMAL HIGH (ref 70–99)
Glucose, Bld: 193 mg/dL — ABNORMAL HIGH (ref 70–99)
Potassium: 3.2 mmol/L — ABNORMAL LOW (ref 3.5–5.1)
Potassium: 3.4 mmol/L — ABNORMAL LOW (ref 3.5–5.1)
Sodium: 135 mmol/L (ref 135–145)
Sodium: 137 mmol/L (ref 135–145)
Total Bilirubin: 1.3 mg/dL — ABNORMAL HIGH (ref 0.3–1.2)
Total Bilirubin: 1.6 mg/dL — ABNORMAL HIGH (ref 0.3–1.2)
Total Protein: 7 g/dL (ref 6.5–8.1)
Total Protein: 7.1 g/dL (ref 6.5–8.1)

## 2022-10-09 LAB — D-DIMER, QUANTITATIVE: D-Dimer, Quant: 1.2 ug/mL-FEU — ABNORMAL HIGH (ref 0.00–0.50)

## 2022-10-09 LAB — HEMOGLOBIN A1C
Hgb A1c MFr Bld: 9.7 % — ABNORMAL HIGH (ref 4.8–5.6)
Mean Plasma Glucose: 231.69 mg/dL

## 2022-10-09 LAB — CBC
HCT: 39.8 % (ref 39.0–52.0)
Hemoglobin: 13.6 g/dL (ref 13.0–17.0)
MCH: 31.6 pg (ref 26.0–34.0)
MCHC: 34.2 g/dL (ref 30.0–36.0)
MCV: 92.6 fL (ref 80.0–100.0)
Platelets: 194 10*3/uL (ref 150–400)
RBC: 4.3 MIL/uL (ref 4.22–5.81)
RDW: 12.3 % (ref 11.5–15.5)
WBC: 13.2 10*3/uL — ABNORMAL HIGH (ref 4.0–10.5)
nRBC: 0 % (ref 0.0–0.2)

## 2022-10-09 LAB — MRSA CULTURE

## 2022-10-09 LAB — GLUCOSE, CAPILLARY
Glucose-Capillary: 239 mg/dL — ABNORMAL HIGH (ref 70–99)
Glucose-Capillary: 196 mg/dL — ABNORMAL HIGH (ref 70–99)

## 2022-10-09 LAB — TROPONIN I (HIGH SENSITIVITY)
Troponin I (High Sensitivity): 19 ng/L — ABNORMAL HIGH (ref <18)
Troponin I (High Sensitivity): 24 ng/L — ABNORMAL HIGH (ref <18)

## 2022-10-09 LAB — AEROBIC/ANAEROBIC CULTURE W GRAM STAIN (SURGICAL/DEEP WOUND)

## 2022-10-09 LAB — CBG MONITORING, ED
Glucose-Capillary: 234 mg/dL — ABNORMAL HIGH (ref 70–99)
Glucose-Capillary: 283 mg/dL — ABNORMAL HIGH (ref 70–99)

## 2022-10-09 MED ORDER — INSULIN GLARGINE-YFGN 100 UNIT/ML ~~LOC~~ SOLN
20.0000 [IU] | Freq: Two times a day (BID) | SUBCUTANEOUS | Status: DC
  Administered 2022-10-09 – 2022-10-11 (×4): 20 [IU] via SUBCUTANEOUS
  Filled 2022-10-09 (×5): qty 0.2

## 2022-10-09 MED ORDER — HEPARIN SODIUM (PORCINE) 5000 UNIT/ML IJ SOLN
5000.0000 [IU] | Freq: Three times a day (TID) | INTRAMUSCULAR | Status: DC
  Administered 2022-10-09: 5000 [IU] via SUBCUTANEOUS
  Filled 2022-10-09: qty 1

## 2022-10-09 MED ORDER — FENOFIBRATE 54 MG PO TABS
54.0000 mg | ORAL_TABLET | Freq: Every day | ORAL | Status: DC
  Administered 2022-10-09 – 2022-10-11 (×3): 54 mg via ORAL
  Filled 2022-10-09 (×3): qty 1

## 2022-10-09 MED ORDER — ATORVASTATIN CALCIUM 20 MG PO TABS
80.0000 mg | ORAL_TABLET | Freq: Every day | ORAL | Status: DC
  Administered 2022-10-09 – 2022-10-11 (×3): 80 mg via ORAL
  Filled 2022-10-09 (×3): qty 4

## 2022-10-09 MED ORDER — LISINOPRIL 20 MG PO TABS
20.0000 mg | ORAL_TABLET | Freq: Every day | ORAL | Status: DC
  Administered 2022-10-09 – 2022-10-11 (×3): 20 mg via ORAL
  Filled 2022-10-09 (×2): qty 1
  Filled 2022-10-09: qty 2

## 2022-10-09 MED ORDER — SODIUM CHLORIDE 0.9% FLUSH
3.0000 mL | Freq: Two times a day (BID) | INTRAVENOUS | Status: DC
  Administered 2022-10-09 – 2022-10-11 (×5): 3 mL via INTRAVENOUS

## 2022-10-09 MED ORDER — GABAPENTIN 300 MG PO CAPS
300.0000 mg | ORAL_CAPSULE | Freq: Two times a day (BID) | ORAL | Status: DC
  Administered 2022-10-09 – 2022-10-11 (×6): 300 mg via ORAL
  Filled 2022-10-09 (×6): qty 1

## 2022-10-09 MED ORDER — ENOXAPARIN SODIUM 40 MG/0.4ML IJ SOSY
40.0000 mg | PREFILLED_SYRINGE | INTRAMUSCULAR | Status: DC
  Administered 2022-10-09 – 2022-10-10 (×2): 40 mg via SUBCUTANEOUS
  Filled 2022-10-09 (×2): qty 0.4

## 2022-10-09 MED ORDER — SODIUM CHLORIDE 0.9 % IV SOLN
2.0000 g | INTRAVENOUS | Status: DC
  Administered 2022-10-09 – 2022-10-10 (×2): 2 g via INTRAVENOUS
  Filled 2022-10-09 (×3): qty 20

## 2022-10-09 MED ORDER — VANCOMYCIN HCL 2000 MG/400ML IV SOLN: 2000.0000 mg | INTRAVENOUS | Status: DC

## 2022-10-09 MED ORDER — ACETAMINOPHEN 325 MG PO TABS
650.0000 mg | ORAL_TABLET | Freq: Four times a day (QID) | ORAL | Status: DC | PRN
  Administered 2022-10-09: 650 mg via ORAL
  Filled 2022-10-09: qty 2

## 2022-10-09 MED ORDER — LINEZOLID 600 MG/300ML IV SOLN
600.0000 mg | Freq: Two times a day (BID) | INTRAVENOUS | Status: DC
  Administered 2022-10-09 – 2022-10-11 (×5): 600 mg via INTRAVENOUS
  Filled 2022-10-09 (×5): qty 300

## 2022-10-09 MED ORDER — INSULIN ASPART 100 UNIT/ML IJ SOLN
0.0000 [IU] | Freq: Three times a day (TID) | INTRAMUSCULAR | Status: DC
  Administered 2022-10-09: 8 [IU] via SUBCUTANEOUS
  Administered 2022-10-09 – 2022-10-11 (×7): 5 [IU] via SUBCUTANEOUS
  Filled 2022-10-09 (×8): qty 1

## 2022-10-09 MED ORDER — LACTATED RINGERS IV SOLN: INTRAVENOUS | Status: AC

## 2022-10-09 MED ORDER — INSULIN ASPART 100 UNIT/ML IJ SOLN
6.0000 [IU] | Freq: Three times a day (TID) | INTRAMUSCULAR | Status: DC
  Administered 2022-10-09 – 2022-10-11 (×6): 6 [IU] via SUBCUTANEOUS
  Filled 2022-10-09 (×6): qty 1

## 2022-10-09 MED ORDER — GABAPENTIN 300 MG PO CAPS: 300.0000 mg | ORAL_CAPSULE | Freq: Two times a day (BID) | ORAL | Status: DC

## 2022-10-09 MED ORDER — ACETAMINOPHEN 650 MG RE SUPP: 650.0000 mg | Freq: Four times a day (QID) | RECTAL | Status: DC | PRN

## 2022-10-09 MED ORDER — ALBUTEROL SULFATE (2.5 MG/3ML) 0.083% IN NEBU: 2.5000 mg | INHALATION_SOLUTION | Freq: Four times a day (QID) | RESPIRATORY_TRACT | Status: DC | PRN

## 2022-10-09 MED ORDER — METRONIDAZOLE 500 MG/100ML IV SOLN
500.0000 mg | Freq: Two times a day (BID) | INTRAVENOUS | Status: DC
  Administered 2022-10-09: 500 mg via INTRAVENOUS
  Filled 2022-10-09: qty 100

## 2022-10-09 MED ORDER — HYDRALAZINE HCL 20 MG/ML IJ SOLN: 5.0000 mg | Freq: Four times a day (QID) | INTRAMUSCULAR | Status: DC | PRN

## 2022-10-09 MED ORDER — CLOPIDOGREL BISULFATE 75 MG PO TABS
75.0000 mg | ORAL_TABLET | Freq: Every day | ORAL | Status: DC
  Administered 2022-10-09 – 2022-10-11 (×3): 75 mg via ORAL
  Filled 2022-10-09 (×3): qty 1

## 2022-10-09 MED ORDER — POTASSIUM CHLORIDE CRYS ER 20 MEQ PO TBCR
20.0000 meq | EXTENDED_RELEASE_TABLET | Freq: Once | ORAL | Status: AC
  Administered 2022-10-09: 20 meq via ORAL
  Filled 2022-10-09: qty 1

## 2022-10-09 MED ORDER — ALBUTEROL SULFATE HFA 108 (90 BASE) MCG/ACT IN AERS: 2.0000 | INHALATION_SPRAY | Freq: Four times a day (QID) | RESPIRATORY_TRACT | Status: DC | PRN

## 2022-10-09 MED ORDER — SODIUM CHLORIDE 0.9 % IV SOLN
2.0000 g | Freq: Three times a day (TID) | INTRAVENOUS | Status: DC
  Administered 2022-10-09: 2 g via INTRAVENOUS
  Filled 2022-10-09: qty 12.5

## 2022-10-09 MED ORDER — VALACYCLOVIR HCL 500 MG PO TABS
500.0000 mg | ORAL_TABLET | Freq: Every day | ORAL | Status: DC
  Administered 2022-10-09 – 2022-10-11 (×3): 500 mg via ORAL
  Filled 2022-10-09 (×3): qty 1

## 2022-10-09 MED ORDER — FLUTICASONE FUROATE-VILANTEROL 100-25 MCG/ACT IN AEPB
1.0000 | INHALATION_SPRAY | Freq: Every day | RESPIRATORY_TRACT | Status: DC
  Administered 2022-10-11: 1 via RESPIRATORY_TRACT
  Filled 2022-10-09 (×3): qty 28

## 2022-10-09 MED ORDER — HYDROCODONE-ACETAMINOPHEN 5-325 MG PO TABS
1.0000 | ORAL_TABLET | ORAL | Status: DC | PRN
  Administered 2022-10-09 – 2022-10-11 (×4): 1 via ORAL
  Filled 2022-10-09 (×4): qty 1

## 2022-10-09 MED ORDER — ASPIRIN 81 MG PO TBEC
81.0000 mg | DELAYED_RELEASE_TABLET | Freq: Every day | ORAL | Status: DC
  Administered 2022-10-09 – 2022-10-11 (×3): 81 mg via ORAL
  Filled 2022-10-09 (×3): qty 1

## 2022-10-09 MED ORDER — VANCOMYCIN HCL 1750 MG/350ML IV SOLN: 1750.0000 mg | INTRAVENOUS | Status: DC

## 2022-10-09 NOTE — Assessment & Plan Note (Signed)
Vitals:   10/08/22 1546 10/08/22 2133 10/08/22 2135 10/08/22 2235  BP: (!) 157/84 (!) 161/77 (!) 161/77 (!) 164/81   10/08/22 2236 10/08/22 2255  BP: (!) 164/81 (!) 143/74  Home regimen consists of lisinopril 20.  Will continue the same patient's creatinine is within normal limits at 0.93.

## 2022-10-09 NOTE — Assessment & Plan Note (Signed)
Pt was seen by vascular today am itself for carotid stenosis.  Plz see note in chart  for A/P.

## 2022-10-09 NOTE — ED Notes (Signed)
Informed provider of elevation of troponin and d-dimer results. No new orders at this time

## 2022-10-09 NOTE — Assessment & Plan Note (Signed)
Replace and follow. ?

## 2022-10-09 NOTE — Assessment & Plan Note (Signed)
IV PPI therapy.  

## 2022-10-09 NOTE — Progress Notes (Signed)
Pharmacy Antibiotic Note  [name]. is a 62 y.o. male admitted on 10/08/2022 with sepsis.  Pharmacy has been consulted for Cefepime & Vancomycin dosing.  Plan: Cefepime 2 gm q8hr per indication & renal fxn.  Vancomycin 1750 mg IV Q 24 hrs. Goal AUC 400-550. Expected AUC: 494.5 SCr used: 0.93  Pharmacy will continue to follow and will adjust abx dosing whenever warranted.  Temp (24hrs), Avg:99.6 F (37.6 C), Min:98.6 F (37 C), Max:101.6 F (38.7 C)   Recent Labs  Lab 10/08/22 1806 10/08/22 1850 10/09/22 0017  WBC 11.4*  --   --   CREATININE  --  0.93  --   LATICACIDVEN 1.8  --  1.6    Estimated Creatinine Clearance: 80.2 mL/min (by C-G formula based on SCr of 0.93 mg/dL).    Allergies  Allergen Reactions   Sulfa Antibiotics Shortness Of Breath and Nausea And Vomiting   Bee Venom Hives and Swelling    All kinds of bees   Morphine Nausea And Vomiting   Other     Certain powders (detergent)    Antimicrobials this admission: 7/22 Ceftriaxone >> x 1 dose 7/22 Vancomycin >>  7/23 Cefepime >>   Microbiology results: 7/22 BCx: Pending  Thank you for allowing pharmacy to be a part of this patient's care.  Otelia Sergeant, PharmD, MBA 10/09/2022 1:00 AM

## 2022-10-09 NOTE — Inpatient Diabetes Management (Addendum)
Inpatient Diabetes Program Recommendations  AACE/ADA: New Consensus Statement on Inpatient Glycemic Control (2015)  Target Ranges:  Prepandial:   less than 140 mg/dL      Peak postprandial:   less than 180 mg/dL (1-2 hours)      Critically ill patients:  140 - 180 mg/dL   Lab Results  Component Value Date   GLUCAP 283 (H) 10/09/2022   HGBA1C 9.7 (H) 10/09/2022    Review of Glycemic Control  Latest Reference Range & Units 10/09/22 07:52 10/09/22 11:23  Glucose-Capillary 70 - 99 mg/dL 161 (H) 096 (H)   Diabetes history: DM  Outpatient Diabetes medications:  Januvia 100 mg daily Humalog (Lispro) 42 units tid with meals Tresiba 40 units bid- verified with wife Jardiance 25 mg daily Current orders for Inpatient glycemic control:  Novolog 0-15 units tid with meals  Inpatient Diabetes Program Recommendations:    May consider adding Semglee 20 units bid and Novolog 6 units tid with meals.    Addendum:  1500- Spoke with patient on phone and then he handed phone to his wife.  She states that she gives patient his insulin at home.  She verified above doses.  She states that patient is slow and needs assistance.  She's been trying to teach him however he is afraid of insulin injections. Will follow.   Thanks,  Beryl Meager, RN, BC-ADM Inpatient Diabetes Coordinator Pager [phone]  (8a-5p)

## 2022-10-09 NOTE — ED Notes (Signed)
Patient spouse requesting meal tray. Dietary notified and to send a tray.

## 2022-10-09 NOTE — Assessment & Plan Note (Addendum)
While waiting for his treatment plan for his abscess patient started to develop right-sided chest pain.  EKG negative for any acute ST-T wave changes.  Troponin negative today.  Patient states that his chest pain has been going off and on for a while now and he sees Dr. Adrian Blackwater for his heart for irregular heart beat and he has told dr.kahn of his chest pain and  is to have stress test. Cardiology consult for chest pain.  Patient continued on aspirin 81, plavix 75 ,atorvastatin, lisinopril.  Hydralazine for blood pressure.

## 2022-10-09 NOTE — Progress Notes (Signed)
PROGRESS NOTE    [name].  UYQ:[id number] DOB: Jul 05, 1960 DOA: 10/08/2022 PCP: Marjie Skiff, NP  [id number]  LOS: 0 days   Brief hospital course:   Assessment & Plan: [name]. is a 62 y.o. 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    Per wife at bedside they did try to drain it before at home and patient has been feeling weak with pain and worsening of the swelling decided to come to the hospital.  Patient did have I&D in the emergency room.  After I&D patient started to report right-sided chest pain that has been going on for the past several months.  Patient's cardio Dr. Welton Flakes had recommended a stress test and patient is waiting for that to be scheduled.    * Cellulitis and abscess of trunk Sepsis --had fever and leukocytosis --Cellulitis and abscess on the right trapezius status post I&D in the emergency room. Wound cultures collected in the emergency room however was sent as MRSA culture. --started on Vanc/cefe/flagyl Plan: --re-collect abscess for Gram stain and aerobic and anaerobic culture --change abx to Linezolid and ceftriaxone (no need for flagyl).  Angina pectoris (HCC) EKG negative for any acute ST-T wave changes.   --Cardiology consulted --contacted Dr. Welton Flakes, who asked for [facility] cardio to cover this consult.  I have messaged [place] oncall provider about this pt. --cont home ASA, plavix and statin  Hypokalemia --monitor and replete PRN  Type 2 diabetes mellitus with proteinuria (HCC) Poorly controlled diabetes, A1c 9.7. --glargine 20 BID --mealtime insulin 6u TID --SSI  Atherosclerosis of both carotid arteries --followed by  vascular surgery --cont home ASA, plavix and statin  History of CVA (cerebrovascular accident) --cont home ASA, plavix and statin  Hypertension associated with diabetes (HCC) --cont Lisinopril   DVT prophylaxis: Lovenox SQ Code Status: Full code  Family Communication: wife updated at bedside today Level of care: Med-Surg Dispo:   The patient is from: home Anticipated d/c is to: home Anticipated d/c date is: 1-2 days Patient currently is not medically ready to d/c due to: on IV abx   Subjective and Interval History:  No chest pain.   Objective: Vitals:   10/09/22 0900 10/09/22 1100 10/09/22 1300 10/09/22 1634  BP: (!) 122/92 123/68 108/63 104/64  Pulse: 66 85 79 73  Resp: 20 18 20 18   Temp:    98.6 F (37 C)  TempSrc:    Oral  SpO2: 94% 93% 95% 98%  Weight:      Height:        Intake/Output Summary (Last 24 hours) at 10/09/2022 1817 Last data filed at 10/09/2022 0708 Gross per 24 hour  Intake 203 ml  Output --  Net 203 ml   Filed Weights   10/08/22 1546  Weight: 77 kg    Examination:   Constitutional: NAD, AAOx3 HEENT: conjunctivae and lids normal, EOMI CV: No cyanosis.   RESP: normal respiratory effort, on RA SKIN: warm, dry.  Small wound over right upper back, packed with small strip, with small amount of purulent drainage. Neuro: II - XII grossly intact.   Psych: Normal mood and affect.     Data Reviewed: I have personally reviewed labs  and imaging studies  Time spent: 50 minutes  Darlin Priestly, MD [facility] If 7PM-7AM, please contact night-coverage 10/09/2022, 6:17 PM

## 2022-10-09 NOTE — Progress Notes (Signed)
Pharmacy Antibiotic Note  [name]. is a 62 y.o. male admitted on 10/08/2022 with sepsis. PMH significant for HTN, T2DM, CAD, HLD, NAFLD. He presents with swelling and drainage on the right upper back since the past 3 to 4 weeks has been progressively worsening and increasing in size and draining. I&D performed in the ED. Pharmacy has been consulted for cefepime, vancomycin dosing.  Plan: Day 1 of antibiotics Give vancomycin 1750 mg IV x1 completed in ED. Start vancomycin 2000 mg IV Q24H. Goal AUC 400-550. Expected AUC: 525.3 Expected Css min: 9.9 SCr used: 0.86  Weight used: IBW, Vd used: 0.72 (BMI 29.1) Continue cefepime 2 g IV Q8H Patient is also on metronidazole 500 mg IV Q12H and valacyclovir 500 mg daily  Continue to monitor renal function and follow culture results   Pharmacy will continue to follow and will adjust abx dosing whenever warranted.  Temp (24hrs), Avg:99.3 F (37.4 C), Min:98.5 F (36.9 C), Max:101.6 F (38.7 C)   Recent Labs  Lab 10/08/22 1806 10/08/22 1850 10/09/22 0017 10/09/22 0254 10/09/22 0546  WBC 11.4*  --   --   --  13.2*  CREATININE  --  0.93  --  0.88 0.86  LATICACIDVEN 1.8  --  1.6  --   --     Estimated Creatinine Clearance: 86.7 mL/min (by C-G formula based on SCr of 0.86 mg/dL).    Allergies  Allergen Reactions   Sulfa Antibiotics Shortness Of Breath and Nausea And Vomiting   Bee Venom Hives and Swelling    All kinds of bees   Morphine Nausea And Vomiting   Other     Certain powders (detergent)    Antimicrobials this admission: 7/22 Ceftriaxone >> x 1 dose 7/22 Vancomycin >>  7/23 Cefepime >>  7/23 Valacyclovir >>  Microbiology results: 7/23 MRSA Fluid Cx: IP  Thank you for allowing pharmacy to be a part of this patient's care.  Celene Squibb, PharmD Clinical Pharmacist 10/09/2022 10:04 AM

## 2022-10-09 NOTE — ED Notes (Signed)
Pt ambulatory to bathroom with walker.

## 2022-10-09 NOTE — Assessment & Plan Note (Signed)
Will continue patient on aspirin 81 along with statin therapy.

## 2022-10-09 NOTE — Progress Notes (Signed)
Patient is complaining of neck and shoulder pain of 8/10 and Tylenol was given at 15:20 and it's not time yet to get it again. No other PRN pain med. Dr.  Para March notified and received new order for Vicodin PRN.

## 2022-10-09 NOTE — Assessment & Plan Note (Signed)
-   Fall precaution 

## 2022-10-09 NOTE — Assessment & Plan Note (Signed)
Poorly controlled diabetes last A1c seen 8.6.  Repeat A1c pending. Home treatment of Jardiance, insulin, lispro, Januvia held.   Plan with glycemic protocol management while in the hospital.

## 2022-10-09 NOTE — Plan of Care (Signed)

## 2022-10-09 NOTE — Assessment & Plan Note (Addendum)
Cellulitis and abscess on the right trapezius status post I&D in the emergency room. Wound cultures collected in the emergency room.  Lactic acid is pending.  Patient meets sepsis criteria and was given vancomycin and Rocephin.  Will add Flagyl for anaerobic coverage as patient is a poorly controlled diabetic.  ceFEPime (MAXIPIME) IV     lactated ringers     metronidazole 500 mg (10/09/22 0145)   vancomycin

## 2022-10-09 NOTE — ED Notes (Signed)
Assumed care of patient at this time

## 2022-10-09 NOTE — Assessment & Plan Note (Signed)
Patient meets sepsis criteria were waiting for lactic acid suspect severe sepsis. Vitals are stable. Will continue broad-spectrum IV antibiotics until wound culture and blood cultures come back. MIVF with LR at 40 cc/hour 12 hours.

## 2022-10-10 DIAGNOSIS — L02219 Cutaneous abscess of trunk, unspecified: Secondary | ICD-10-CM | POA: Diagnosis not present

## 2022-10-10 DIAGNOSIS — L03319 Cellulitis of trunk, unspecified: Secondary | ICD-10-CM | POA: Diagnosis not present

## 2022-10-10 LAB — GLUCOSE, CAPILLARY
Glucose-Capillary: 201 mg/dL — ABNORMAL HIGH (ref 70–99)
Glucose-Capillary: 188 mg/dL — ABNORMAL HIGH (ref 70–99)
Glucose-Capillary: 293 mg/dL — ABNORMAL HIGH (ref 70–99)

## 2022-10-10 LAB — CBC
HCT: 40.1 % (ref 39.0–52.0)
Hemoglobin: 13.4 g/dL (ref 13.0–17.0)
MCH: 31.2 pg (ref 26.0–34.0)
MCHC: 33.4 g/dL (ref 30.0–36.0)
MCV: 93.5 fL (ref 80.0–100.0)
Platelets: 202 10*3/uL (ref 150–400)
RBC: 4.29 MIL/uL (ref 4.22–5.81)
RDW: 12.1 % (ref 11.5–15.5)
WBC: 13.3 10*3/uL — ABNORMAL HIGH (ref 4.0–10.5)
nRBC: 0 % (ref 0.0–0.2)

## 2022-10-10 LAB — BASIC METABOLIC PANEL
Anion gap: 5 (ref 5–15)
BUN: 14 mg/dL (ref 6–20)
CO2: 24 mmol/L (ref 22–32)
Calcium: 8.2 mg/dL — ABNORMAL LOW (ref 8.9–10.3)
Chloride: 105 mmol/L (ref 98–111)
Creatinine, Ser: 0.99 mg/dL (ref 0.61–1.24)
GFR, Estimated: >60 mL/min (ref >60)
Glucose, Bld: 230 mg/dL — ABNORMAL HIGH (ref 70–99)
Potassium: 3.1 mmol/L — ABNORMAL LOW (ref 3.5–5.1)
Sodium: 134 mmol/L — ABNORMAL LOW (ref 135–145)

## 2022-10-10 LAB — AEROBIC/ANAEROBIC CULTURE W GRAM STAIN (SURGICAL/DEEP WOUND)

## 2022-10-10 LAB — MAGNESIUM: Magnesium: 2.1 mg/dL (ref 1.7–2.4)

## 2022-10-10 MED ORDER — POTASSIUM CHLORIDE CRYS ER 20 MEQ PO TBCR
40.0000 meq | EXTENDED_RELEASE_TABLET | Freq: Two times a day (BID) | ORAL | Status: AC
  Administered 2022-10-10 (×2): 40 meq via ORAL
  Filled 2022-10-10 (×2): qty 2

## 2022-10-10 NOTE — Consult Note (Signed)
CARDIOLOGY CONSULT NOTE               Patient ID: [name]. MRN: [id number] DOB/AGE: Mar 04, 1961 62 y.o.  Admit date: 10/08/2022 Referring Physician [name] Primary Physician Aura Dials NP Primary Cardiologist Adrian Blackwater MD Reason for Consultation history of chest pain  HPI: 62 year old male history of irregular heartbeat asthma COPD diabetes CVA hypertension presents with cellulitis of the buttock area requiring antibiotic therapy and inpatient management patient is being seen evaluated by Dr. Welton Flakes a few years ago and did consider cardiac workup including functional study at some point the patient had multiple other issues at the time and decided to defer cardiac workup until those issues were addressed patient has been admitted with cellulitis no recent chest pain except for atypical right-sided discomfort.  No worsening shortness of breath no Baclospas or syncope no previous myocardial infarction no stents no bypass has a history of hyperlipidemia diabetes hypertension all of which are being treated medically patient resting comfortably in bed with his wife at the bedside with no new complaints  Review of systems complete and found to be negative unless listed above     Past Medical History:  Diagnosis Date   Arrhythmia    Asthma    CCC (chronic calculous cholecystitis)    COPD (chronic obstructive pulmonary disease) (HCC)    Cyst of kidney, acquired    Diabetes mellitus without complication (HCC) 2013   type 2   Diverticulosis    Edentulous    Fatty liver    Gallbladder polyp    GERD (gastroesophageal reflux disease)    Heart murmur    History of chicken pox    History of measles as a child    History of PKU    Hyperlipidemia    Hypertension    IBS (irritable bowel syndrome)    Irregular heart beat    Mentally challenged    Pancreatitis    PONV (postoperative nausea and vomiting)    Stroke (HCC) 03/02/2019   vision issues - right  eye/right sided weakness   Vision impairment    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    Past Surgical History:  Procedure Laterality Date   Cardiac Catherization     [facility]   CARDIAC CATHETERIZATION     [facility]   CATARACT EXTRACTION W/PHACO Left 06/05/2021   Procedure: CATARACT EXTRACTION PHACO AND INTRAOCULAR LENS PLACEMENT (IOC) LEFT DIABETIC 3.75 00:28.0;  Surgeon: Nevada Crane, MD;  Location: [facility];  Service: Ophthalmology;  Laterality: Left;  Diabetic   CATARACT EXTRACTION W/PHACO Right 07/10/2021   Procedure: CATARACT EXTRACTION PHACO AND INTRAOCULAR LENS PLACEMENT (IOC) RIGHT DIABETIC;  Surgeon: Nevada Crane, MD;  Location: [facility];  Service: Ophthalmology;  Laterality: Right;  Diabetic 2.66 00:24.4   COLONOSCOPY     COLONOSCOPY WITH PROPOFOL N/A 08/10/2020   Procedure: COLONOSCOPY WITH PROPOFOL;  Surgeon: Toney Reil, MD;  Location: [facility] ENDOSCOPY;  Service: Gastroenterology;  Laterality: N/A;  Has ankle monitor; (Parole officer "Ms Ward Givens needs a time before Monday [phone]" - per patient's wife)   ESOPHAGOGASTRODUODENOSCOPY (EGD) WITH PROPOFOL N/A 12/27/2016   Procedure: ESOPHAGOGASTRODUODENOSCOPY (EGD) WITH PROPOFOL;  Surgeon: Toney Reil, MD;  Location: [facility] ENDOSCOPY;  Service: Gastroenterology;  Laterality: N/A;   HEMORRHOID SURGERY     KNEE SURGERY     cyst removed   Ligament Removal Left    of left thumb: dr. Alberteen Spindle   ligament removal  of left  thumb     Dr. Alberteen Spindle   NM GATED MYOCARDIAL STUDY (ARMX HX)  06/23/2014   Paraschos. Normal   WISDOM TOOTH EXTRACTION      Medications Prior to Admission  Medication Sig Dispense Refill Last Dose   aspirin 81 MG EC tablet Take 1 tablet (81 mg total) by mouth daily. 180 tablet 0 10/07/2022 at unknown   atorvastatin (LIPITOR) 80 MG tablet Take 80 mg by mouth daily.   10/07/2022 at unknown   clopidogrel (PLAVIX) 75 MG tablet Take 1 tablet (75 mg total) by mouth daily. 90 tablet  4 10/07/2022 at unknown   empagliflozin (JARDIANCE) 25 MG TABS tablet Take 1 tablet (25 mg total) by mouth daily. 90 tablet 4 10/07/2022 at unknown   EPINEPHRINE 0.3 mg/0.3 mL IJ SOAJ injection INJECT 1 SYRINGE INTO OUTER THIGH ONCE AS NEEDED FOR SEVERE ALLERGIC REACTION. (Patient taking differently: Inject 0.3 mg into the muscle as needed.) 2 each 1 prn at prn   fenofibrate 54 MG tablet Take 1 tablet (54 mg total) by mouth daily. 90 tablet 4 10/07/2022 at unknown   fluticasone furoate-vilanterol (BREO ELLIPTA) 100-25 MCG/ACT AEPB Inhale 1 puff into the lungs daily. 60 each 4 unknown at unknown   gabapentin (NEURONTIN) 300 MG capsule Take 1 capsule (300 mg total) by mouth 2 (two) times daily. 180 capsule 4 10/07/2022 at unknown   HYDROcodone-acetaminophen (NORCO/VICODIN) 5-325 MG tablet Take 1 tablet by mouth every 6 (six) hours as needed for moderate pain.   prn at prn   icosapent Ethyl (VASCEPA) 1 g capsule Take 1 capsule (1 g total) by mouth 2 (two) times daily. 180 capsule 4 10/07/2022 at unknown   Insulin Degludec (TRESIBA) 100 UNIT/ML SOLN Inject 40 Units into the skin 2 (two) times daily. 30 mL 4 10/07/2022 at unknown   Insulin Lispro Junior KwikPen (HUMALOG JR) 100 UNIT/ML KwikPen Inject 42 Units into the skin 3 (three) times daily before meals.   10/07/2022 at unknown   lisinopril (ZESTRIL) 20 MG tablet Take 1 tablet (20 mg total) by mouth daily. 90 tablet 4 10/07/2022 at unknown   montelukast (SINGULAIR) 10 MG tablet Take 1 tablet (10 mg total) by mouth at bedtime. 90 tablet 4 10/07/2022 at unknown   mupirocin ointment (BACTROBAN) 2 % Apply 1 Application topically 2 (two) times daily. 22 g 3 10/07/2022 at unknown   nitroGLYCERIN (NITROSTAT) 0.4 MG SL tablet Place 1 tablet (0.4 mg total) under the tongue every 5 (five) minutes as needed for chest pain. 50 tablet 3 prn at prn   omeprazole (PRILOSEC) 20 MG capsule Take 1 capsule (20 mg total) by mouth daily. 90 capsule 4 10/07/2022   rosuvastatin  (CRESTOR) 40 MG tablet Take 1 tablet (40 mg total) by mouth daily. 90 tablet 4 10/07/2022 at unknown   sitaGLIPtin (JANUVIA) 100 MG tablet Take 1 tablet (100 mg total) by mouth daily. 90 tablet 4 10/07/2022 at unknown   tiZANidine (ZANAFLEX) 2 MG tablet Take 1 tablet (2 mg total) by mouth every 6 (six) hours as needed for muscle spasms. (Patient taking differently: Take 2 mg by mouth every 6 (six) hours.) 30 tablet 1 prn at prn   traMADol (ULTRAM) 50 MG tablet Take 50 mg by mouth every 6 (six) hours as needed for moderate pain.   prn at prn   triamcinolone cream (KENALOG) 0.1 % Apply 1 Application topically 2 (two) times daily. (Patient taking differently: Apply 1 Application topically 2 (two) times daily. Arms and legs) 30  g 1 prn at prn   valACYclovir (VALTREX) 500 MG tablet Take 500 mg by mouth daily.   10/07/2022 at unknown   Vitamin D, Ergocalciferol, (DRISDOL) 1.25 MG (50000 UNIT) CAPS capsule Take 50,000 Units by mouth once a week.   10/01/2022 at unknown   albuterol (VENTOLIN HFA) 108 (90 Base) MCG/ACT inhaler Inhale 2 puffs into the lungs every 6 (six) hours as needed. (Patient not taking: Reported on 10/09/2022) 18 g 0 Not Taking   Blood Glucose Monitoring Suppl (ONE TOUCH ULTRA 2) w/Device KIT Use to check blood sugar 4 times a day 1 kit 0    glucose blood (ONETOUCH ULTRA) test strip TEST BLOOD SUGAR 5 TIMES A DAY 300 each 4  at unknown   Insulin Disposable Pump (OMNIPOD DASH PDM, GEN 4,) KIT AS PRESCRIBED USE AS DIRECTED BASED ON TOTAL DAILY INSULIN AND POD CHANGE FREQUENCY      Insulin Pen Needle (PEN NEEDLES) 32G X 4 MM MISC 1 kit by Does not apply route daily. 100 each 4    Misc. Devices (PULSE OXIMETER FOR FINGER) MISC To check O2 saturations once daily with asthma and document + check if any shortness of breath 1 each 1    OneTouch Delica Lancets 33G MISC USE AS DIRECTED 300 each 3    ULTRACARE PEN NEEDLES 32G X 5 MM MISC       Social History   Socioeconomic History   Marital status:  Married    Spouse name: Not on file   Number of children: 0   Years of education: Not on file   Highest education level: 12th grade  Occupational History   Occupation: Disabled  Tobacco Use   Smoking status: Never    Passive exposure: Never   Smokeless tobacco: Never  Vaping Use   Vaping status: Never Used  Substance and Sexual Activity   Alcohol use: No    Alcohol/week: 0.0 standard drinks of alcohol   Drug use: No   Sexual activity: Not Currently  Other Topics Concern   Not on file  Social History Narrative   ** Merged History Encounter **       ** Data from: 10/22/14 Enc Dept: [facility]       ** Data from: 10/07/14 Enc Dept: [facility]   Arrest: Arrested in 2012 for indecent liberties, required to wear ankle monitor   Social Determinants of Health   Financial Resource Strain: Low Risk  (06/26/2022)   Overall Financial Resource Strain (CARDIA)    Difficulty of Paying Living Expenses: Not very hard  Recent Concern: Physicist, medical Strain - High Risk (06/25/2022)   Overall Financial Resource Strain (CARDIA)    Difficulty of Paying Living Expenses: Hard  Food Insecurity: No Food Insecurity (10/09/2022)   Hunger Vital Sign    Worried About Running Out of Food in the Last Year: Never true    Ran Out of Food in the Last Year: Never true  Transportation Needs: No Transportation Needs (10/09/2022)   PRAPARE - Administrator, Civil Service (Medical): No    Lack of Transportation (Non-Medical): No  Physical Activity: Inactive (06/26/2022)   Exercise Vital Sign    Days of Exercise per Week: 0 days    Minutes of Exercise per Session: 0 min  Stress: No Stress Concern Present (06/26/2022)   [employer] of Occupational Health - Occupational Stress Questionnaire    Feeling of Stress : Only a little  Recent Concern: Stress - Stress  Concern Present (06/25/2022)   [employer] of Occupational Health - Occupational Stress Questionnaire    Feeling of  Stress : To some extent  Social Connections: Socially Isolated (06/26/2022)   Social Connection and Isolation Panel [NHANES]    Frequency of Communication with Friends and Family: Once a week    Frequency of Social Gatherings with Friends and Family: Once a week    Attends Religious Services: Never    Database administrator or Organizations: No    Attends Banker Meetings: Never    Marital Status: Married  Catering manager Violence: Not At Risk (10/09/2022)   Humiliation, Afraid, Rape, and Kick questionnaire    Fear of Current or Ex-Partner: No    Emotionally Abused: No    Physically Abused: No    Sexually Abused: No    Family History  Problem Relation Age of Onset   Cancer Mother        throat   Diabetes Father    Heart disease Father    Cancer Maternal Grandmother    Cancer Maternal Grandfather        pancreatic   Cancer Paternal Grandfather       Review of systems complete and found to be negative unless listed above      PHYSICAL EXAM  General: Well developed, well nourished, in no acute distress HEENT:  Normocephalic and atramatic Neck:  No JVD.  Lungs: Clear bilaterally to auscultation and percussion. Heart: HRRR . Normal S1 and S2 without gallops or murmurs.  Abdomen: Bowel sounds are positive, abdomen soft and non-tender  Msk:  Back normal, normal gait. Normal strength and tone for age. Extremities: No clubbing, cyanosis or edema.   Neuro: Alert and oriented X 3. Psych:  Good affect, responds appropriately  Labs:   Lab Results  Component Value Date   WBC 13.3 (H) 10/10/2022   HGB 13.4 10/10/2022   HCT 40.1 10/10/2022   MCV 93.5 10/10/2022   PLT 202 10/10/2022    Recent Labs  Lab 10/09/22 0546 10/10/22 0335  NA 137 134*  K 3.4* 3.1*  CL 105 105  CO2 24 24  BUN 11 14  CREATININE 0.86 0.99  CALCIUM 8.6* 8.2*  PROT 7.1  --   BILITOT 1.6*  --   ALKPHOS 81  --   ALT 21  --   AST 21  --   GLUCOSE 193* 230*   Lab Results  Component  Value Date   CKTOTAL 197 11/04/2017   TROPONINI <0.03 11/27/2015    Lab Results  Component Value Date   CHOL 121 07/13/2022   CHOL 199 06/25/2022   CHOL 193 03/28/2022   Lab Results  Component Value Date   HDL 36 (L) 07/13/2022   HDL 21 (L) 06/25/2022   HDL 26 (L) 03/28/2022   Lab Results  Component Value Date   LDLCALC 61 07/13/2022   LDLCALC Comment (A) 06/25/2022   LDLCALC 90 03/28/2022   Lab Results  Component Value Date   TRIG 136 07/13/2022   TRIG 1,131 (HH) 06/25/2022   TRIG 469 (H) 03/28/2022   Lab Results  Component Value Date   CHOLHDL 5.3 03/03/2019   CHOLHDL 3.9 08/04/2018   CHOLHDL 4.2 05/22/2017   No results found for: "LDLDIRECT"    Radiology: DG Chest 2 View  Result Date: 10/08/2022 CLINICAL DATA:  Chest pain. EXAM: CHEST - 2 VIEW COMPARISON:  Chest radiograph dated 11/27/2015. FINDINGS: Mild bilateral perihilar and left lung base interstitial densities  similar to prior radiograph and may be chronic. No consolidative changes. There is no pleural effusion or pneumothorax. The cardiac silhouette is within normal limits. No acute osseous pathology. IMPRESSION: No active cardiopulmonary disease. Electronically Signed   By: Elgie Collard M.D.   On: 10/08/2022 22:47   CT Soft Tissue Neck W Contrast  Result Date: 10/08/2022 CLINICAL DATA:  Initial evaluation for soft tissue infection. EXAM: CT NECK WITH CONTRAST TECHNIQUE: Multidetector CT imaging of the neck was performed using the standard protocol following the bolus administration of intravenous contrast. RADIATION DOSE REDUCTION: This exam was performed according to the departmental dose-optimization program which includes automated exposure control, adjustment of the mA and/or kV according to patient size and/or use of iterative reconstruction technique. CONTRAST:  75mL OMNIPAQUE IOHEXOL 300 MG/ML  SOLN COMPARISON:  None Available. FINDINGS: Pharynx and larynx: Oral cavity within normal limits. Oropharynx  and nasopharynx normal. No retropharyngeal collection. Negative epiglottis. Hypopharynx and supraglottic larynx within normal limits. Negative glottis. Subglottic airway clear. Salivary glands: Salivary glands including the parotid and submandibular glands within normal limits. Thyroid: Normal. Lymph nodes: Few mildly prominent subcentimeter right posterior chain lymph nodes, presumably reactive. No other enlarged or pathologic adenopathy. Vascular: Normal intravascular enhancement seen within the neck. Mild atheromatous change about the carotid bifurcations. Limited intracranial: Unremarkable. Visualized orbits: Unremarkable. Mastoids and visualized paranasal sinuses: Visualized paranasal sinuses are clear. Visualized mastoids and middle ear cavities well pneumatized and free of fluid. Skeleton: No discrete or worrisome osseous lesions. Mild spondylosis present at C5-6 and C6-7. Osteoarthritic changes present about the C1-2 articulations. Patient is edentulous. Upper chest: No acute finding. Other: Diffuse soft tissue swelling with inflammatory stranding present within the subcutaneous fat of the right upper back, concerning for infection/cellulitis (series 2, image 48). Changes involve the subcutaneous fat as well as a portion of the underlying right trapezius muscle. No discrete abscess or drainable fluid collection. Superimposed 1.3 cm hyperdensity at this location, indeterminate, and possibly related to prior intervention. Foreign body not excluded (series 5, image 30). IMPRESSION: 1. Diffuse soft tissue swelling with inflammatory stranding within the subcutaneous fat of the right upper back, concerning for infection/cellulitis. No discrete abscess or drainable fluid collection. Superimposed 1.3 cm hyperdensity at this location, indeterminate, and possibly related to prior intervention. Foreign body not excluded. 2. No other acute abnormality within the neck. Electronically Signed   By: Rise Mu  M.D.   On: 10/08/2022 22:02    EKG: Normal sinus rhythm rate of 65 with nonspecific ST-T wave changes  ASSESSMENT AND PLAN:  Cellulitis Sepsis Chest pain questionable angina Hypokalemia Diabetes Hypertension GERD Fall precautions Hyperlipidemia . Plan Continue broad-spectrum antibiotic therapy for cellulitis Correct electrolytes including hypokalemia Recommend aggressive blood pressure management and control Continue diabetes management Maintain statin therapy for hyperlipidemia Possible chest pain doubtful for angina man cardiac workup at this stage we will consider outpatient functional study No invasive procedures and cardiology is recommended at this point Main conservative cardiac evaluation for now Signed: Alwyn Pea MD, 10/10/2022, 8:25 AM

## 2022-10-10 NOTE — TOC CM/SW Note (Signed)
CSW acknowledges consult for medication assistance. Per RN, patient reports difficulty paying for medications. Pharmacy is aware.  Charlynn Court, CSW [phone]

## 2022-10-10 NOTE — Plan of Care (Signed)

## 2022-10-10 NOTE — Progress Notes (Signed)
PROGRESS NOTE    [name]  RUE:[id number] DOB: 06-Jun-1960 DOA: 10/08/2022 PCP: Marjie Skiff, NP   Assessment & Plan:   Principal Problem:   Cellulitis and abscess of trunk Active Problems:   Angina pectoris (HCC)   Hypertension associated with diabetes (HCC)   GERD (gastroesophageal reflux disease)   Frequent falls   History of CVA (cerebrovascular accident)   Atherosclerosis of both carotid arteries   Type 2 diabetes mellitus with proteinuria (HCC)   Hypokalemia   Sepsis (HCC)   Abscess  Assessment and Plan: Cellulitis and abscess of back: s/p ID of right trapezius abscess. Continue on IV linezolid, rocephin. Cx growing staph auerus, sens pending   Sepsis: w/ fever, leukocytosis & secondary to abscess. Continue on IV abxs. Sepsis resolved   Angina pectoris: EKG negative for any acute ST-T wave changes. Troponins minimally elevated and trending down. Continue on aspirin, plavix, statin. Cardio consulted    Hypokalemia: potassium given    DM2: poorly controlled, HbA1c 9.7. Continue on glargine, SSI w/ accuchecks    Atherosclerosis of both carotid arteries: continue on aspirin, statin, plavix. Followed by vasc surg outpatient    Hx of CVA: continue on aspirin, statin, plavix    HTN: continue on lisinopril       DVT prophylaxis: lovenox  Code Status: full  Family Communication:  Disposition Plan: likely d/c back home  Level of care: Med-Surg Status is: Inpatient Remains inpatient appropriate because: requires IV abxs     Consultants:    Procedures:  Antimicrobials: rocephin, linezolid    Subjective: Pt c/o malaise   Objective: Vitals:   10/10/22 0400 10/10/22 0407 10/10/22 0408 10/10/22 0438  BP:  123/73    Pulse:  69    Resp: 18 18  18   Temp:  98.9 F (37.2 C)    TempSrc:  Oral    SpO2:  99%    Weight:   77.9 kg   Height:        Intake/Output Summary (Last 24 hours) at 10/10/2022 0731 Last data filed at 10/10/2022  0200 Gross per 24 hour  Intake 594.37 ml  Output --  Net 594.37 ml   Filed Weights   10/08/22 1546 10/10/22 0408  Weight: 77 kg 77.9 kg    Examination:  General exam: Appears calm and comfortable  Respiratory system: Clear to auscultation. Respiratory effort normal. Cardiovascular system: S1 & S2+. Norubs, gallops or clicks. Gastrointestinal system: Abdomen is nondistended, soft and nontender.  Normal bowel sounds heard. Central nervous system: Alert and oriented. Moves all extremities  Psychiatry: Judgement and insight appear normal. Mood & affect appropriate.     Data Reviewed: I have personally reviewed following labs and imaging studies  CBC: Recent Labs  Lab 10/08/22 1806 10/09/22 0546 10/10/22 0335  WBC 11.4* 13.2* 13.3*  NEUTROABS 9.3*  --   --   HGB 13.4 13.6 13.4  HCT 39.4 39.8 40.1  MCV 92.1 92.6 93.5  PLT 187 194 202   Basic Metabolic Panel: Recent Labs  Lab 10/08/22 1850 10/09/22 0254 10/09/22 0546 10/10/22 0335  NA 134* 135 137 134*  K 3.3* 3.2* 3.4* 3.1*  CL 103 104 105 105  CO2 24 21* 24 24  GLUCOSE 210* 195* 193* 230*  BUN 11 11 11 14   CREATININE 0.93 0.88 0.86 0.99  CALCIUM 8.6* 8.4* 8.6* 8.2*  MG  --   --   --  2.1   GFR: Estimated Creatinine Clearance: 75.8 mL/min (by C-G formula  based on SCr of 0.99 mg/dL). Liver Function Tests: Recent Labs  Lab 10/08/22 1850 10/09/22 0254 10/09/22 0546  AST 21 18 21   ALT 24 21 21   ALKPHOS 89 82 81  BILITOT 1.0 1.3* 1.6*  PROT 7.4 7.0 7.1  ALBUMIN 4.1 3.7 3.7   No results for input(s): "LIPASE", "AMYLASE" in the last 168 hours. No results for input(s): "AMMONIA" in the last 168 hours. Coagulation Profile: No results for input(s): "INR", "PROTIME" in the last 168 hours. Cardiac Enzymes: No results for input(s): "CKTOTAL", "CKMB", "CKMBINDEX", "TROPONINI" in the last 168 hours. BNP (last 3 results) No results for input(s): "PROBNP" in the last 8760 hours. HbA1C: Recent Labs     10/09/22 0215  HGBA1C 9.7*   CBG: Recent Labs  Lab 10/09/22 0752 10/09/22 1123 10/09/22 1632 10/09/22 2145  GLUCAP 234* 283* 239* 196*   Lipid Profile: No results for input(s): "CHOL", "HDL", "LDLCALC", "TRIG", "CHOLHDL", "LDLDIRECT" in the last 72 hours. Thyroid Function Tests: No results for input(s): "TSH", "T4TOTAL", "FREET4", "T3FREE", "THYROIDAB" in the last 72 hours. Anemia Panel: No results for input(s): "VITAMINB12", "FOLATE", "FERRITIN", "TIBC", "IRON", "RETICCTPCT" in the last 72 hours. Sepsis Labs: Recent Labs  Lab 10/08/22 1806 10/09/22 0017  LATICACIDVEN 1.8 1.6    Recent Results (from the past 240 hour(s))  MRSA culture     Status: None   Collection Time: 10/09/22  2:15 AM   Specimen: Nasal; Body Fluid  Result Value Ref Range Status   Specimen Description   Final    NASAL SWAB Performed at [facility], [address], [place], [place] [address]    Special Requests   Final    NONE Performed at [facility], [address] [place] [place], [place] [address]    Culture   Final    NO STAPHYLOCOCCUS AUREUS ISOLATED Performed at [facility], [address] [place], [place] [address]    Report Status 10/09/2022 FINAL  Final  Aerobic/Anaerobic Culture w Gram Stain (surgical/deep wound)     Status: None (Preliminary result)   Collection Time: 10/09/22 12:13 PM   Specimen: Back  Result Value Ref Range Status   Specimen Description   Final    BACK Performed at [facility], [address], [place], [place] [address]    Special Requests   Final    NONE Performed at [facility], [address] [place], [place] [address]    Gram Stain   Final    RARE WBC PRESENT,BOTH PMN AND MONONUCLEAR RARE GRAM POSITIVE COCCI IN CLUSTERS Performed at [facility], [address] [place], [place] [address]    Culture PENDING  Incomplete   Report Status PENDING  Incomplete         Radiology Studies: DG Chest 2  View  Result Date: 10/08/2022 CLINICAL DATA:  Chest pain. EXAM: CHEST - 2 VIEW COMPARISON:  Chest radiograph dated 11/27/2015. FINDINGS: Mild bilateral perihilar and left lung base interstitial densities similar to prior radiograph and may be chronic. No consolidative changes. There is no pleural effusion or pneumothorax. The cardiac silhouette is within normal limits. No acute osseous pathology. IMPRESSION: No active cardiopulmonary disease. Electronically Signed   By: Elgie Collard M.D.   On: 10/08/2022 22:47   CT Soft Tissue Neck W Contrast  Result Date: 10/08/2022 CLINICAL DATA:  Initial evaluation for soft tissue infection. EXAM: CT NECK WITH CONTRAST TECHNIQUE: Multidetector CT imaging of the neck was performed using the standard protocol  following the bolus administration of intravenous contrast. RADIATION DOSE REDUCTION: This exam was performed according to the departmental dose-optimization program which includes automated exposure control, adjustment of the mA and/or kV according to patient size and/or use of iterative reconstruction technique. CONTRAST:  75mL OMNIPAQUE IOHEXOL 300 MG/ML  SOLN COMPARISON:  None Available. FINDINGS: Pharynx and larynx: Oral cavity within normal limits. Oropharynx and nasopharynx normal. No retropharyngeal collection. Negative epiglottis. Hypopharynx and supraglottic larynx within normal limits. Negative glottis. Subglottic airway clear. Salivary glands: Salivary glands including the parotid and submandibular glands within normal limits. Thyroid: Normal. Lymph nodes: Few mildly prominent subcentimeter right posterior chain lymph nodes, presumably reactive. No other enlarged or pathologic adenopathy. Vascular: Normal intravascular enhancement seen within the neck. Mild atheromatous change about the carotid bifurcations. Limited intracranial: Unremarkable. Visualized orbits: Unremarkable. Mastoids and visualized paranasal sinuses: Visualized paranasal sinuses are  clear. Visualized mastoids and middle ear cavities well pneumatized and free of fluid. Skeleton: No discrete or worrisome osseous lesions. Mild spondylosis present at C5-6 and C6-7. Osteoarthritic changes present about the C1-2 articulations. Patient is edentulous. Upper chest: No acute finding. Other: Diffuse soft tissue swelling with inflammatory stranding present within the subcutaneous fat of the right upper back, concerning for infection/cellulitis (series 2, image 48). Changes involve the subcutaneous fat as well as a portion of the underlying right trapezius muscle. No discrete abscess or drainable fluid collection. Superimposed 1.3 cm hyperdensity at this location, indeterminate, and possibly related to prior intervention. Foreign body not excluded (series 5, image 30). IMPRESSION: 1. Diffuse soft tissue swelling with inflammatory stranding within the subcutaneous fat of the right upper back, concerning for infection/cellulitis. No discrete abscess or drainable fluid collection. Superimposed 1.3 cm hyperdensity at this location, indeterminate, and possibly related to prior intervention. Foreign body not excluded. 2. No other acute abnormality within the neck. Electronically Signed   By: Rise Mu M.D.   On: 10/08/2022 22:02        Scheduled Meds:  aspirin EC  81 mg Oral Daily   atorvastatin  80 mg Oral Daily   clopidogrel  75 mg Oral Daily   enoxaparin (LOVENOX) injection  40 mg Subcutaneous Q24H   fenofibrate  54 mg Oral Daily   fluticasone furoate-vilanterol  1 puff Inhalation Daily   gabapentin  300 mg Oral BID   insulin aspart  0-15 Units Subcutaneous TID WC   insulin aspart  6 Units Subcutaneous TID WC   insulin glargine-yfgn  20 Units Subcutaneous BID   lisinopril  20 mg Oral Daily   sodium chloride flush  3 mL Intravenous Q12H   valACYclovir  500 mg Oral Daily   Continuous Infusions:  cefTRIAXone (ROCEPHIN)  IV Stopped (10/09/22 1410)   linezolid (ZYVOX) IV 600 mg  (10/09/22 2155)     LOS: 1 day    Time spent: 25 mins     Charise Killian, MD [facility] Pager [phone]  If 7PM-7AM, please contact night-coverage [email or web address] 10/10/2022, 7:31 AM

## 2022-10-10 NOTE — Plan of Care (Signed)
  Problem: Coping: Goal: Ability to adjust to condition or change in health will improve Outcome: Progressing   Problem: Fluid Volume: Goal: Ability to maintain a balanced intake and output will improve Outcome: Progressing   Problem: Nutritional: Goal: Maintenance of adequate nutrition will improve Outcome: Progressing   Problem: Tissue Perfusion: Goal: Adequacy of tissue perfusion will improve Outcome: Progressing   Problem: Clinical Measurements: Goal: Ability to maintain clinical measurements within normal limits will improve Outcome: Progressing Goal: Respiratory complications will improve Outcome: Progressing Goal: Cardiovascular complication will be avoided Outcome: Progressing   Problem: Activity: Goal: Risk for activity intolerance will decrease Outcome: Progressing   Problem: Nutrition: Goal: Adequate nutrition will be maintained Outcome: Progressing   Problem: Elimination: Goal: Will not experience complications related to bowel motility Outcome: Progressing   Problem: Safety: Goal: Ability to remain free from injury will improve Outcome: Progressing

## 2022-10-11 ENCOUNTER — Ambulatory Visit (INDEPENDENT_AMBULATORY_CARE_PROVIDER_SITE_OTHER): Payer: Medicare Other

## 2022-10-11 ENCOUNTER — Ambulatory Visit: Payer: Self-pay | Admitting: *Deleted

## 2022-10-11 DIAGNOSIS — L03319 Cellulitis of trunk, unspecified: Secondary | ICD-10-CM | POA: Diagnosis not present

## 2022-10-11 DIAGNOSIS — E1159 Type 2 diabetes mellitus with other circulatory complications: Secondary | ICD-10-CM

## 2022-10-11 DIAGNOSIS — E1129 Type 2 diabetes mellitus with other diabetic kidney complication: Secondary | ICD-10-CM

## 2022-10-11 DIAGNOSIS — J4540 Moderate persistent asthma, uncomplicated: Secondary | ICD-10-CM

## 2022-10-11 DIAGNOSIS — L02219 Cutaneous abscess of trunk, unspecified: Secondary | ICD-10-CM | POA: Diagnosis not present

## 2022-10-11 DIAGNOSIS — E1165 Type 2 diabetes mellitus with hyperglycemia: Secondary | ICD-10-CM

## 2022-10-11 LAB — BASIC METABOLIC PANEL
Anion gap: 7 (ref 5–15)
BUN: 17 mg/dL (ref 6–20)
CO2: 22 mmol/L (ref 22–32)
Calcium: 8.2 mg/dL — ABNORMAL LOW (ref 8.9–10.3)
Chloride: 107 mmol/L (ref 98–111)
Creatinine, Ser: 0.96 mg/dL (ref 0.61–1.24)
GFR, Estimated: >60 mL/min (ref >60)
Glucose, Bld: 265 mg/dL — ABNORMAL HIGH (ref 70–99)
Potassium: 4 mmol/L (ref 3.5–5.1)
Sodium: 136 mmol/L (ref 135–145)

## 2022-10-11 LAB — CBC
HCT: 38.3 % — ABNORMAL LOW (ref 39.0–52.0)
Hemoglobin: 13 g/dL (ref 13.0–17.0)
MCH: 31.3 pg (ref 26.0–34.0)
MCHC: 33.9 g/dL (ref 30.0–36.0)
MCV: 92.3 fL (ref 80.0–100.0)
Platelets: 204 10*3/uL (ref 150–400)
RBC: 4.15 MIL/uL — ABNORMAL LOW (ref 4.22–5.81)
RDW: 12.3 % (ref 11.5–15.5)
nRBC: 0 % (ref 0.0–0.2)

## 2022-10-11 LAB — GLUCOSE, CAPILLARY
Glucose-Capillary: 212 mg/dL — ABNORMAL HIGH (ref 70–99)
Glucose-Capillary: 240 mg/dL — ABNORMAL HIGH (ref 70–99)

## 2022-10-11 LAB — AEROBIC/ANAEROBIC CULTURE W GRAM STAIN (SURGICAL/DEEP WOUND)

## 2022-10-11 LAB — MAGNESIUM: Magnesium: 2.3 mg/dL (ref 1.7–2.4)

## 2022-10-11 MED ORDER — DOXYCYCLINE MONOHYDRATE 100 MG PO TABS: 100.0000 mg | ORAL_TABLET | Freq: Two times a day (BID) | ORAL | 0 refills | Status: AC

## 2022-10-11 NOTE — Discharge Summary (Signed)
Physician Discharge Summary  [name] NWG:[id number] DOB: 14-May-1960 DOA: 10/08/2022  PCP: Marjie Skiff, NP  Admit date: 10/08/2022 Discharge date: 10/11/2022  Admitted From: home  Disposition:  home   Recommendations for Outpatient Follow-up:  Follow up with PCP in 1-2 weeks F/u w/ cardio, Dr. Welton Flakes, in 1 week    Home Health: Pt refused home health  Equipment/Devices: walker   Discharge Condition: stable  CODE STATUS: full  Diet recommendation: Heart Healthy / Carb Modified  Brief/Interim Summary: HPI was taken from [name]: Alex Boom Cary Lothrop. is a 62 y.o. 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  Per wife at bedside they did try to drain it before at home and patient has been feeling weak with pain and worsening of the swelling decided to come to the hospital today.  Patient did have I&D in the emergency room and had wound cultures collected as he met sepsis criteria.  After I&D patient started to report right-sided chest pain that has been going on for the past several months.  Patient has discussed with his cardiologist who recommended that he needs a stress test and patient is waiting for that to be scheduled.  Patient otherwise does not report any headaches blurred vision fevers or chills shortness of breath palpitations nausea vomiting diarrhea bleeding or any other complaints.   In the emergency room patient meets sepsis criteria with vitals and source of infection. Blood work shows leukocytosis of 11.4 CMP shows hypokalemia of 3.3, lactic acid was 1 point 0.8 initially and has remained normal. CMP shows normal LFTs  otherwise.  Per patient his glucoses always high in the 200s and his diabetes is poorly controlled. Patient's last A1c was 10.5.    Discharge Diagnoses:  Principal Problem:   Cellulitis and abscess of trunk Active Problems:   Angina pectoris (HCC)   Hypertension associated with diabetes (HCC)   GERD (gastroesophageal reflux disease)   Frequent falls   History of CVA (cerebrovascular accident)   Atherosclerosis of both carotid arteries   Type 2 diabetes mellitus with proteinuria (HCC)   Hypokalemia   Sepsis (HCC)   Abscess  Cellulitis and abscess of back: s/p ID of right trapezius abscess. Transition to po doxycycline at d/c x 8 days. Cx growing MRSA  Sepsis: w/ fever, leukocytosis & secondary to abscess. Continue on IV abxs. Sepsis resolved   Chest pain: doubtful angina as per cardio. EKG negative for any acute ST-T wave changes. Troponins minimally elevated and trending down. Continue on aspirin, plavix, statin. No invasive procedures recommended as per cardio. F/u outpatient w/ cardio w/in 1 week    Hypokalemia: WNL today    DM2: poorly controlled, HbA1c 9.7. Continue on glargine, SSI w/ accuchecks    Atherosclerosis of both carotid arteries: continue on aspirin, statin, plavix. Followed by vasc surg outpatient    Hx of CVA: continue on aspirin, statin, plavix    HTN: continue on lisinopril   Discharge Instructions  Discharge Instructions     Diet - low sodium heart healthy   Complete by: As directed    Diet Carb Modified   Complete by: As directed    Discharge instructions  Complete by: As directed    F/u w/ PCP in 1-2 weeks   Discharge wound care:   Complete by: As directed    Change back dressing twice a day   Increase activity slowly   Complete by: As directed       Allergies as of 10/11/2022       Reactions   Sulfa Antibiotics Shortness Of Breath, Nausea And Vomiting   Bee Venom Hives, Swelling   All kinds of bees   Morphine Nausea And Vomiting    Other    Certain powders (detergent)        Medication List     TAKE these medications    albuterol 108 (90 Base) MCG/ACT inhaler Commonly known as: VENTOLIN HFA Inhale 2 puffs into the lungs every 6 (six) hours as needed.   aspirin EC 81 MG tablet Take 1 tablet (81 mg total) by mouth daily.   atorvastatin 80 MG tablet Commonly known as: LIPITOR Take 80 mg by mouth daily.   clopidogrel 75 MG tablet Commonly known as: PLAVIX Take 1 tablet (75 mg total) by mouth daily.   doxycycline 100 MG tablet Commonly known as: ADOXA Take 1 tablet (100 mg total) by mouth 2 (two) times daily for 8 days.   empagliflozin 25 MG Tabs tablet Commonly known as: Jardiance Take 1 tablet (25 mg total) by mouth daily.   EPINEPHrine 0.3 mg/0.3 mL Soaj injection Commonly known as: EPI-PEN INJECT 1 SYRINGE INTO OUTER THIGH ONCE AS NEEDED FOR SEVERE ALLERGIC REACTION. What changed: See the new instructions.   fenofibrate 54 MG tablet Take 1 tablet (54 mg total) by mouth daily.   fluticasone furoate-vilanterol 100-25 MCG/ACT Aepb Commonly known as: Breo Ellipta Inhale 1 puff into the lungs daily.   gabapentin 300 MG capsule Commonly known as: NEURONTIN Take 1 capsule (300 mg total) by mouth 2 (two) times daily.   HYDROcodone-acetaminophen 5-325 MG tablet Commonly known as: NORCO/VICODIN Take 1 tablet by mouth every 6 (six) hours as needed for moderate pain.   icosapent Ethyl 1 g capsule Commonly known as: VASCEPA Take 1 capsule (1 g total) by mouth 2 (two) times daily.   Insulin Degludec 100 UNIT/ML Soln Commonly known as: Tresiba Inject 40 Units into the skin 2 (two) times daily.   Insulin Lispro Junior KwikPen 100 UNIT/ML KwikPen Commonly known as: HUMALOG JR Inject 42 Units into the skin 3 (three) times daily before meals.   lisinopril 20 MG tablet Commonly known as: ZESTRIL Take 1 tablet (20 mg total) by mouth daily.   montelukast 10 MG tablet Commonly known as:  SINGULAIR Take 1 tablet (10 mg total) by mouth at bedtime.   mupirocin ointment 2 % Commonly known as: BACTROBAN Apply 1 Application topically 2 (two) times daily.   nitroGLYCERIN 0.4 MG SL tablet Commonly known as: NITROSTAT Place 1 tablet (0.4 mg total) under the tongue every 5 (five) minutes as needed for chest pain.   omeprazole 20 MG capsule Commonly known as: PRILOSEC Take 1 capsule (20 mg total) by mouth daily.   Omnipod DASH PDM (Gen 4) Kit AS PRESCRIBED USE AS DIRECTED BASED ON TOTAL DAILY INSULIN AND POD CHANGE FREQUENCY   ONE TOUCH ULTRA 2 w/Device Kit Use to check blood sugar 4 times a day   OneTouch Delica Lancets 33G Misc USE AS DIRECTED   OneTouch Ultra test strip Generic drug: glucose blood TEST BLOOD SUGAR 5 TIMES A DAY   Pen Needles 32G X 4 MM Misc 1  kit by Does not apply route daily.   Ultracare Pen Needles 32G X 5 MM Misc Generic drug: Insulin Pen Needle   Pulse Oximeter For Finger Misc To check O2 saturations once daily with asthma and document + check if any shortness of breath   rosuvastatin 40 MG tablet Commonly known as: CRESTOR Take 1 tablet (40 mg total) by mouth daily.   sitaGLIPtin 100 MG tablet Commonly known as: Januvia Take 1 tablet (100 mg total) by mouth daily.   tiZANidine 2 MG tablet Commonly known as: ZANAFLEX Take 1 tablet (2 mg total) by mouth every 6 (six) hours as needed for muscle spasms. What changed: when to take this   traMADol 50 MG tablet Commonly known as: ULTRAM Take 50 mg by mouth every 6 (six) hours as needed for moderate pain.   triamcinolone cream 0.1 % Commonly known as: KENALOG Apply 1 Application topically 2 (two) times daily. What changed: additional instructions   valACYclovir 500 MG tablet Commonly known as: VALTREX Take 500 mg by mouth daily.   Vitamin D (Ergocalciferol) 1.25 MG (50000 UNIT) Caps capsule Commonly known as: DRISDOL Take 50,000 Units by mouth once a week.                Durable Medical Equipment  (From admission, onward)           Start     Ordered   10/11/22 1217  For home use only DME 4 wheeled rolling walker with seat  Once       Question:  Patient needs a walker to treat with the following condition  Answer:  Generalized weakness   10/11/22 1216   10/11/22 1008  For home use only DME Walker rolling  Once       Comments: 4 wheel walker  Question Answer Comment  Walker: With 5 Inch Wheels   Patient needs a walker to treat with the following condition Generalized weakness      10/11/22 1008              Discharge Care Instructions  (From admission, onward)           Start     Ordered   10/11/22 0000  Discharge wound care:       Comments: Change back dressing twice a day   10/11/22 1249            Follow-up Information     Adrian Blackwater A, MD. Go in 1 week(s).   Specialty: Cardiology Contact information: [address] [place] [address] [phone]                Allergies  Allergen Reactions   Sulfa Antibiotics Shortness Of Breath and Nausea And Vomiting   Bee Venom Hives and Swelling    All kinds of bees   Morphine Nausea And Vomiting   Other     Certain powders (detergent)    Consultations:    Procedures/Studies: VAS US CAROTID  Result Date: 10/10/2022 Carotid Arterial Duplex Study Patient Name:  [name].  Date of Exam:   10/08/2022 Medical Rec #: [id number]               Accession #:    [id number] Date of Birth: 10/29/1960              Patient Gender: M Patient Age:   76 years Exam Location:  [facility] Procedure:      VAS US CAROTID Referring Phys:  FALLON BROWN --------------------------------------------------------------------------------  Indications:       Carotid artery disease. Risk Factors:      Hypertension, hyperlipidemia, prior CVA. Comparison Study:  11/01/2021 Performing Technologist: Debbe Bales RVS  Examination Guidelines: A complete evaluation  includes B-mode imaging, spectral Doppler, color Doppler, and power Doppler as needed of all accessible portions of each vessel. Bilateral testing is considered an integral part of a complete examination. Limited examinations for reoccurring indications may be performed as noted.  Right Carotid Findings: +----------+--------+--------+--------+------------------+--------+           PSV cm/sEDV cm/sStenosisPlaque DescriptionComments +----------+--------+--------+--------+------------------+--------+ CCA Prox  112     12                                         +----------+--------+--------+--------+------------------+--------+ CCA Mid   108     13                                         +----------+--------+--------+--------+------------------+--------+ CCA Distal108     13                                         +----------+--------+--------+--------+------------------+--------+ ICA Prox  98      19                                         +----------+--------+--------+--------+------------------+--------+ ICA Mid   74      18                                         +----------+--------+--------+--------+------------------+--------+ ICA Distal81      20                                         +----------+--------+--------+--------+------------------+--------+ ECA       129     0                                          +----------+--------+--------+--------+------------------+--------+ +----------+--------+-------+--------+-------------------+           PSV cm/sEDV cmsDescribeArm Pressure (mmHG) +----------+--------+-------+--------+-------------------+ [id number]      0                                  +----------+--------+-------+--------+-------------------+ +---------+--------+--+--------+-+ VertebralPSV cm/s49EDV cm/s6 +---------+--------+--+--------+-+  Left Carotid Findings:  +----------+--------+--------+--------+------------------+--------+           PSV cm/sEDV cm/sStenosisPlaque DescriptionComments +----------+--------+--------+--------+------------------+--------+ CCA Prox  122     13                                         +----------+--------+--------+--------+------------------+--------+ CCA Mid   116  18                                         +----------+--------+--------+--------+------------------+--------+ CCA Distal127     20                                         +----------+--------+--------+--------+------------------+--------+ ICA Prox  101     20                                         +----------+--------+--------+--------+------------------+--------+ ICA Mid   81      17                                         +----------+--------+--------+--------+------------------+--------+ ICA Distal88      19                                         +----------+--------+--------+--------+------------------+--------+ ECA       72      0                                          +----------+--------+--------+--------+------------------+--------+ +----------+--------+--------+--------+-------------------+           PSV cm/sEDV cm/sDescribeArm Pressure (mmHG) +----------+--------+--------+--------+-------------------+ [id number]     0                                   +----------+--------+--------+--------+-------------------+ +---------+--------+--+--------+--+ VertebralPSV cm/s64EDV cm/s15 +---------+--------+--+--------+--+   Summary: Right Carotid: Velocities in the right ICA are consistent with a 1-39% stenosis. Left Carotid: Velocities in the left ICA are consistent with a 1-39% stenosis. Vertebrals:  Bilateral vertebral arteries demonstrate antegrade flow. Subclavians: Normal flow hemodynamics were seen in bilateral subclavian              arteries. *See table(s) above for measurements and  observations.  Electronically signed by Levora Dredge MD on 10/10/2022 at 4:29:11 PM.    Final    DG Chest 2 View  Result Date: 10/08/2022 CLINICAL DATA:  Chest pain. EXAM: CHEST - 2 VIEW COMPARISON:  Chest radiograph dated 11/27/2015. FINDINGS: Mild bilateral perihilar and left lung base interstitial densities similar to prior radiograph and may be chronic. No consolidative changes. There is no pleural effusion or pneumothorax. The cardiac silhouette is within normal limits. No acute osseous pathology. IMPRESSION: No active cardiopulmonary disease. Electronically Signed   By: Elgie Collard M.D.   On: 10/08/2022 22:47   CT Soft Tissue Neck W Contrast  Result Date: 10/08/2022 CLINICAL DATA:  Initial evaluation for soft tissue infection. EXAM: CT NECK WITH CONTRAST TECHNIQUE: Multidetector CT imaging of the neck was performed using the standard protocol following the bolus administration of intravenous contrast. RADIATION DOSE REDUCTION: This exam was performed according to the departmental dose-optimization program which includes automated exposure control, adjustment of the mA and/or kV according  to patient size and/or use of iterative reconstruction technique. CONTRAST:  75mL OMNIPAQUE IOHEXOL 300 MG/ML  SOLN COMPARISON:  None Available. FINDINGS: Pharynx and larynx: Oral cavity within normal limits. Oropharynx and nasopharynx normal. No retropharyngeal collection. Negative epiglottis. Hypopharynx and supraglottic larynx within normal limits. Negative glottis. Subglottic airway clear. Salivary glands: Salivary glands including the parotid and submandibular glands within normal limits. Thyroid: Normal. Lymph nodes: Few mildly prominent subcentimeter right posterior chain lymph nodes, presumably reactive. No other enlarged or pathologic adenopathy. Vascular: Normal intravascular enhancement seen within the neck. Mild atheromatous change about the carotid bifurcations. Limited intracranial: Unremarkable.  Visualized orbits: Unremarkable. Mastoids and visualized paranasal sinuses: Visualized paranasal sinuses are clear. Visualized mastoids and middle ear cavities well pneumatized and free of fluid. Skeleton: No discrete or worrisome osseous lesions. Mild spondylosis present at C5-6 and C6-7. Osteoarthritic changes present about the C1-2 articulations. Patient is edentulous. Upper chest: No acute finding. Other: Diffuse soft tissue swelling with inflammatory stranding present within the subcutaneous fat of the right upper back, concerning for infection/cellulitis (series 2, image 48). Changes involve the subcutaneous fat as well as a portion of the underlying right trapezius muscle. No discrete abscess or drainable fluid collection. Superimposed 1.3 cm hyperdensity at this location, indeterminate, and possibly related to prior intervention. Foreign body not excluded (series 5, image 30). IMPRESSION: 1. Diffuse soft tissue swelling with inflammatory stranding within the subcutaneous fat of the right upper back, concerning for infection/cellulitis. No discrete abscess or drainable fluid collection. Superimposed 1.3 cm hyperdensity at this location, indeterminate, and possibly related to prior intervention. Foreign body not excluded. 2. No other acute abnormality within the neck. Electronically Signed   By: Rise Mu M.D.   On: 10/08/2022 22:02   (Echo, Carotid, EGD, Colonoscopy, ERCP)    Subjective: Pt c/o fatigue    Discharge Exam: Vitals:   10/11/22 0446 10/11/22 0726  BP: 135/82 128/73  Pulse: (!) 57 (!) 55  Resp: 16 16  Temp: 97.7 F (36.5 C) 97.6 F (36.4 C)  SpO2: 100% 99%   Vitals:   10/10/22 1627 10/10/22 2011 10/11/22 0446 10/11/22 0726  BP: 110/67 106/61 135/82 128/73  Pulse: 75 63 (!) 57 (!) 55  Resp: 16 20 16 16   Temp: 98.2 F (36.8 C) (!) 97.5 F (36.4 C) 97.7 F (36.5 C) 97.6 F (36.4 C)  TempSrc: Oral Oral    SpO2: 93% 93% 100% 99%  Weight:      Height:         General: Pt is alert, awake, not in acute distress Cardiovascular: S1/S2 +, no rubs, no gallops Respiratory: CTA bilaterally, no wheezing, no rhonchi Abdominal: Soft, NT, ND, bowel sounds + Extremities: no edema, no cyanosis    The results of significant diagnostics from this hospitalization (including imaging, microbiology, ancillary and laboratory) are listed below for reference.     Microbiology: Recent Results (from the past 240 hour(s))  MRSA culture     Status: None   Collection Time: 10/09/22  2:15 AM   Specimen: Nasal; Body Fluid  Result Value Ref Range Status   Specimen Description   Final    NASAL SWAB Performed at [facility], [address] [place], [place] [address]    Special Requests   Final    NONE Performed at [facility], [address] [place] [place], [place] [address]    Culture   Final    NO STAPHYLOCOCCUS AUREUS ISOLATED Performed at [facility], [address] [place], [place] [address]    Report Status 10/09/2022 FINAL  Final  Aerobic/Anaerobic Culture w Gram Stain (surgical/deep wound)     Status: None (Preliminary result)   Collection Time: 10/09/22 12:13 PM   Specimen: Back  Result Value Ref Range Status   Specimen Description   Final    BACK Performed at [facility], [address], [place], [place] [address]    Special Requests   Final    NONE Performed at [facility], [address] [place], [place] [address]    Gram Stain   Final    RARE WBC PRESENT,BOTH PMN AND MONONUCLEAR RARE GRAM POSITIVE COCCI IN CLUSTERS Performed at [facility], [address], [place], [place] [address]    Culture   Final    MODERATE METHICILLIN RESISTANT STAPHYLOCOCCUS AUREUS NO ANAEROBES ISOLATED; CULTURE IN PROGRESS FOR 5 DAYS    Report Status PENDING  Incomplete   Organism ID, Bacteria METHICILLIN RESISTANT STAPHYLOCOCCUS AUREUS  Final      Susceptibility   Methicillin resistant staphylococcus  aureus - MIC*    CIPROFLOXACIN >=8 RESISTANT Resistant     ERYTHROMYCIN >=8 RESISTANT Resistant     GENTAMICIN <=0.5 SENSITIVE Sensitive     OXACILLIN >=4 RESISTANT Resistant     TETRACYCLINE <=1 SENSITIVE Sensitive     VANCOMYCIN <=0.5 SENSITIVE Sensitive     TRIMETH/SULFA >=320 RESISTANT Resistant     CLINDAMYCIN <=0.25 SENSITIVE Sensitive     RIFAMPIN <=0.5 SENSITIVE Sensitive     Inducible Clindamycin NEGATIVE Sensitive     LINEZOLID 2 SENSITIVE Sensitive     * MODERATE METHICILLIN RESISTANT STAPHYLOCOCCUS AUREUS     Labs: BNP (last 3 results) No results for input(s): "BNP" in the last 8760 hours. Basic Metabolic Panel: Recent Labs  Lab 10/08/22 1850 10/09/22 0254 10/09/22 0546 10/10/22 0335 10/11/22 0332  NA 134* 135 137 134* 136  K 3.3* 3.2* 3.4* 3.1* 4.0  CL 103 104 105 105 107  CO2 24 21* 24 24 22   GLUCOSE 210* 195* 193* 230* 265*  BUN 11 11 11 14 17   CREATININE 0.93 0.88 0.86 0.99 0.96  CALCIUM 8.6* 8.4* 8.6* 8.2* 8.2*  MG  --   --   --  2.1 2.3   Liver Function Tests: Recent Labs  Lab 10/08/22 1850 10/09/22 0254 10/09/22 0546  AST 21 18 21   ALT 24 21 21   ALKPHOS 89 82 81  BILITOT 1.0 1.3* 1.6*  PROT 7.4 7.0 7.1  ALBUMIN 4.1 3.7 3.7   No results for input(s): "LIPASE", "AMYLASE" in the last 168 hours. No results for input(s): "AMMONIA" in the last 168 hours. CBC: Recent Labs  Lab 10/08/22 1806 10/09/22 0546 10/10/22 0335 10/11/22 0332  WBC 11.4* 13.2* 13.3* 9.3  NEUTROABS 9.3*  --   --   --   HGB 13.4 13.6 13.4 13.0  HCT 39.4 39.8 40.1 38.3*  MCV 92.1 92.6 93.5 92.3  PLT 187 194 202 204   Cardiac Enzymes: No results for input(s): "CKTOTAL", "CKMB", "CKMBINDEX", "TROPONINI" in the last 168 hours. BNP: Invalid input(s): "POCBNP" CBG: Recent Labs  Lab 10/10/22 0835 10/10/22 1624 10/10/22 2113 10/11/22 0727 10/11/22 1129  GLUCAP 201* 188* 293* 212* 240*   D-Dimer Recent Labs    10/09/22 0151  DDIMER 1.20*   Hgb A1c Recent Labs     10/09/22 0215  HGBA1C 9.7*   Lipid Profile No results for input(s): "CHOL", "HDL", "LDLCALC", "TRIG", "CHOLHDL", "LDLDIRECT" in the  last 72 hours. Thyroid function studies No results for input(s): "TSH", "T4TOTAL", "T3FREE", "THYROIDAB" in the last 72 hours.  Invalid input(s): "FREET3" Anemia work up No results for input(s): "VITAMINB12", "FOLATE", "FERRITIN", "TIBC", "IRON", "RETICCTPCT" in the last 72 hours. Urinalysis    Component Value Date/Time   COLORURINE STRAW (A) 11/28/2015 0143   APPEARANCEUR Clear 12/30/2019 1442   LABSPEC 1.005 11/28/2015 0143   PHURINE 7.0 11/28/2015 0143   GLUCOSEU Negative 12/30/2019 1442   HGBUR NEGATIVE 11/28/2015 0143   BILIRUBINUR Negative 12/30/2019 1442   KETONESUR NEGATIVE 11/28/2015 0143   PROTEINUR Negative 12/30/2019 1442   PROTEINUR NEGATIVE 11/28/2015 0143   UROBILINOGEN 1.0 10/31/2017 0937   NITRITE Negative 12/30/2019 1442   NITRITE NEGATIVE 11/28/2015 0143   LEUKOCYTESUR Negative 12/30/2019 1442   Sepsis Labs Recent Labs  Lab 10/08/22 1806 10/09/22 0546 10/10/22 0335 10/11/22 0332  WBC 11.4* 13.2* 13.3* 9.3   Microbiology Recent Results (from the past 240 hour(s))  MRSA culture     Status: None   Collection Time: 10/09/22  2:15 AM   Specimen: Nasal; Body Fluid  Result Value Ref Range Status   Specimen Description   Final    NASAL SWAB Performed at [facility], [address], [place], [place] [address]    Special Requests   Final    NONE Performed at [facility], [address] [place] [place], [place] [address]    Culture   Final    NO STAPHYLOCOCCUS AUREUS ISOLATED Performed at [facility], [address], [place], [place] [address]    Report Status 10/09/2022 FINAL  Final  Aerobic/Anaerobic Culture w Gram Stain (surgical/deep wound)     Status: None (Preliminary result)   Collection Time: 10/09/22 12:13 PM   Specimen: Back  Result Value Ref Range Status   Specimen Description   Final     BACK Performed at [facility], [address] [place], [place] [address]    Special Requests   Final    NONE Performed at [facility], [address] [place], [place] [address]    Gram Stain   Final    RARE WBC PRESENT,BOTH PMN AND MONONUCLEAR RARE GRAM POSITIVE COCCI IN CLUSTERS Performed at [facility], [address], [place], [place] [address]    Culture   Final    MODERATE METHICILLIN RESISTANT STAPHYLOCOCCUS AUREUS NO ANAEROBES ISOLATED; CULTURE IN PROGRESS FOR 5 DAYS    Report Status PENDING  Incomplete   Organism ID, Bacteria METHICILLIN RESISTANT STAPHYLOCOCCUS AUREUS  Final      Susceptibility   Methicillin resistant staphylococcus aureus - MIC*    CIPROFLOXACIN >=8 RESISTANT Resistant     ERYTHROMYCIN >=8 RESISTANT Resistant     GENTAMICIN <=0.5 SENSITIVE Sensitive     OXACILLIN >=4 RESISTANT Resistant     TETRACYCLINE <=1 SENSITIVE Sensitive     VANCOMYCIN <=0.5 SENSITIVE Sensitive     TRIMETH/SULFA >=320 RESISTANT Resistant     CLINDAMYCIN <=0.25 SENSITIVE Sensitive     RIFAMPIN <=0.5 SENSITIVE Sensitive     Inducible Clindamycin NEGATIVE Sensitive     LINEZOLID 2 SENSITIVE Sensitive     * MODERATE METHICILLIN RESISTANT STAPHYLOCOCCUS AUREUS     Time coordinating discharge: Over 30 minutes  SIGNED:   Charise Killian, MD  [facility] 10/11/2022, 12:49 PM Pager   If 7PM-7AM, please contact night-coverage [email or web address]

## 2022-10-11 NOTE — TOC Initial Note (Signed)
Transition of Care (TOC) - Initial/Assessment Note    Patient Details  Name: [name]. MRN: [id number] Date of Birth: Aug 21, 1960  Transition of Care [facility]) CM/SW Contact:    Darolyn Rua, LCSW Phone Number: 10/11/2022, 10:21 AM  Clinical Narrative:                  CSW spoke with patient regarding OT recs of home health and walker. Patient requesting 4 wheel walker to be delivered to his hospital room, he reports he feels he's doing well right now and declines home health. Reports he will let CSW know if he changes his mind and wants home health services.   4 wheel RW ordered via adapt to be delivered to hospital room.   No further needs identified at this time.   Expected Discharge Plan: Home/Self Care Barriers to Discharge: Continued Medical Work up   Patient Goals and CMS Choice Patient states their goals for this hospitalization and ongoing recovery are:: to go home CMS Medicare.gov Compare Post Acute Care list provided to:: Patient Choice offered to / list presented to : Patient      Expected Discharge Plan and Services       Living arrangements for the past 2 months: Single Family Home                                      Prior Living Arrangements/Services Living arrangements for the past 2 months: Single Family Home Lives with:: Self                   Activities of Daily Living Home Assistive Devices/Equipment: None ADL Screening (condition at time of admission) Patient's cognitive ability adequate to safely complete daily activities?: Yes Is the patient deaf or have difficulty hearing?: No Does the patient have difficulty seeing, even when wearing glasses/contacts?: No Does the patient have difficulty concentrating, remembering, or making decisions?: No Patient able to express need for assistance with ADLs?: Yes Does the patient have difficulty dressing or bathing?: No Independently performs ADLs?: Yes (appropriate for  developmental age) Does the patient have difficulty walking or climbing stairs?: No Weakness of Legs: None Weakness of Arms/Hands: None  Permission Sought/Granted                  Emotional Assessment       Orientation: : Oriented to Self, Oriented to Place, Oriented to  Time, Oriented to Situation Alcohol / Substance Use: Not Applicable Psych Involvement: No (comment)  Admission diagnosis:  Abscess [L02.91] Cellulitis [L03.90] Cellulitis, unspecified cellulitis site [L03.90] Patient Active Problem List   Diagnosis Date Noted   Cellulitis and abscess of trunk 10/09/2022   Hypokalemia 10/09/2022   Sepsis (HCC) 10/09/2022   Abscess 10/09/2022   History of cholecystectomy 03/29/2022   Right hip pain 05/24/2021   B12 deficiency 04/26/2021   Heart murmur 09/21/2020   Type 2 diabetes mellitus with proteinuria (HCC) 03/25/2019   Atherosclerosis of both carotid arteries 03/22/2019   History of CVA (cerebrovascular accident) 03/06/2019   Uncontrolled type 2 diabetes mellitus with hyperglycemia (HCC)    Obesity 09/03/2018   Frequent falls 05/15/2016   Vitamin D deficiency 08/22/2015   Carpal tunnel syndrome on left 05/12/2015   External hemorrhoids 12/20/2014   Allergic rhinitis 10/21/2014   Diverticulosis 10/21/2014   Hypertension associated with diabetes (HCC) 10/21/2014   Hyperlipidemia associated with type 2 diabetes  mellitus (HCC) 10/21/2014   Asthma 10/21/2014   GERD (gastroesophageal reflux disease) 10/21/2014   NAFLD (nonalcoholic fatty liver disease) 16/12/9602   Arthritis 10/21/2014   Angina pectoris (HCC) 10/21/2014   History of pancreatitis 10/07/2014   Phenylketonuria (PKU) (HCC) 10/07/2014   Renal cyst, left 10/07/2014   PCP:  Marjie Skiff, NP Pharmacy:   [facility], [place] - [address] [place] [place] [address] Phone: [phone] Fax: [phone]  [facility] [address] [place] [address] Phone: [phone] Fax: [phone]     Social Determinants of Health (SDOH) Social History: SDOH Screenings   Food Insecurity: No Food Insecurity (10/09/2022)  Housing: Patient Declined (10/09/2022)  Transportation Needs: No Transportation Needs (10/09/2022)  Utilities: Not At Risk (10/09/2022)  Alcohol Screen: Low Risk  (06/26/2022)  Depression (PHQ2-9): High Risk (10/01/2022)  Financial Resource Strain: Low Risk  (06/26/2022)  Recent Concern: Financial Resource Strain - High Risk (06/25/2022)  Physical Activity: Inactive (06/26/2022)  Social Connections: Socially Isolated (06/26/2022)  Stress: No Stress Concern Present (06/26/2022)  Recent Concern: Stress - Stress Concern Present (06/25/2022)  Tobacco Use: Low Risk  (10/08/2022)  Health Literacy: Inadequate Health Literacy (10/11/2022)   SDOH Interventions:     Readmission Risk Interventions     No data to display

## 2022-10-11 NOTE — Chronic Care Management (AMB) (Signed)
Chronic Care Management   CCM RN Visit Note  10/11/2022 Name: [name]. MRN: [id number] DOB: [birth year]  Subjective: [name]. is a 62 y.o. year old male who is a primary care patient of Cannady, Dorie Rank, NP. The patient was referred to the Chronic Care Management team for assistance with care management needs subsequent to provider initiation of CCM services and plan of care.    Today's Visit:   Spoke to the patients wife, [name] who is also DPR  for follow up visit.     SDOH Interventions Today    Flowsheet Row Most Recent Value  SDOH Interventions   Health Literacy Interventions Other (Comment)  [the team works with the patient and his wife to help him understand his conditions and how best to take care of himself]         Goals Addressed             This Visit's Progress    CCM Expected Outcome:  Monitor, Self-Manage and Reduce Symptoms of Diabetes       Current Barriers:  Knowledge Deficits related to the need to check blood sugars and keep blood sugars in normal range Care Coordination needs related to the importance of monitoring blood sugars, eating healthy, maintaining good control of DM in a patient with DM Chronic Disease Management support and education needs related to effective management of DM Literacy barriers  Lab Results  Component Value Date   HGBA1C 9.7 (H) 10/09/2022  Saw endocrinologist recently A1C was 8.5%   Planned Interventions: Provided education to patient about basic DM disease process. The patient needs ongoing support and education with effective management of DM. The patients wife states currently he is eating better and checking his blood sugars and writing them down. The patient is currently in the hospital due to an abscess. His wife called and talked with the RNCM. Will continue to monitor for discharge.  Reviewed medications with patient and discussed importance of medication adherence. The patient is compliant  with medications. The endocrinologist wants him to wear an insulin pump. The patient and the patients wife do not feel it is safe for him to wear the pump as the patient is not able to manage the pump. The patient and his wife want a referral to a new endocrinologist as they feel that they are being pushed to do something that mentally the patient is not able to do. Reviewed prescribed diet with patient heart healthy/ADA. The patient is doing better with ADA and options that will help with maintaining healthy blood sugars but having a hard time refraining from salt ; Counseled on importance of regular laboratory monitoring as prescribed. Review of goals of A1C and the patient is hoping his next A1C is much better;        Discussed plans with patient for ongoing care management follow up and provided patient with direct contact information for care management team;      Provided patient with written educational materials related to hypo and hyperglycemia and importance of correct treatment. The patient has had a couple falls due to his blood sugars getting down to 60's. The patients wife states he was concerned about his blood sugars being too high so he has not been eating like he was supposed to. Education on safety and monitoring for changes in the way he feels. Denies and lows. Highest he has seen is 183.        Reviewed scheduled/upcoming provider appointments  including: 06-25-2022 at 140 pm Advised patient, providing education and rationale, to check cbg before meals and at bedtime and when you have symptoms of low or high blood sugar and record. The endocrinologist had given the patient a continuous glucose reader but he kept knocking it off of his arm. He does not wish to keep wearing this. Education on monitoring for changes and letting the provider know for acute changes.   The patients stated blood sugar today at the time of the call was 157.  The patients wife gave several readings to the [facility]:  [phone], and 157.       call provider for findings outside established parameters;       Review of patient status, including review of consultants reports, relevant laboratory and other test results, and medications completed;       Advised patient to discuss changes in DM, questions, or concerns with provider;     Sees eye specialist in January. Is possibly going to have new surgery to help vision  Screening for signs and symptoms of depression related to chronic disease state;        Assessed social determinant of health barriers;         Symptom Management: Take medications as prescribed   Attend all scheduled provider appointments Call provider office for new concerns or questions  call the Suicide and Crisis Lifeline: 988 call the [place] National Suicide Prevention Lifeline: 346-549-0463 or TTY: 805-504-7470 TTY 817-328-9509) to talk to a trained counselor call 1-800-273-TALK (toll free, 24 hour hotline) if experiencing a Mental Health or Behavioral Health Crisis  keep appointment with eye doctor check feet daily for cuts, sores or redness trim toenails straight across manage portion size wash and dry feet carefully every day wear comfortable, cotton socks wear comfortable, well-fitting shoes  Follow Up Plan: Telephone follow up appointment with care management team member scheduled for: 10-18-2022 at 1015 am       CCM Expected Outcome:  Monitor, Self-Manage and Reduce Symptoms of: Asthma       Current Barriers:  Knowledge Deficits related to triggers that can cause exacerbation of his asthma Chronic Disease Management support and education needs related to effective management of Asthma  Planned Interventions: Provided patient with basic written and verbal Asthma education on self care/management/and exacerbation prevention. Denies any issues with his breathing. The patient states his breathing is stable. Monitoring for changes in his breathing related to allergies.  Denies any new concerns at this time; Advised patient to track and manage Asthma triggers. Review of the weather changes, being aware of people with infections, and monitoring for changes in his breathing;  Provided written and verbal instructions on pursed lip breathing and utilized returned demonstration as teach back; Provided instruction about proper use of medications used for management of Asthma including inhalers; Advised patient to self assesses Asthma action plan zone and make appointment with provider if in the yellow zone for 48 hours without improvement; Advised patient to engage in light exercise as tolerated 3-5 days a week to aid in the the management of Asthma; Provided education about and advised patient to utilize infection prevention strategies to reduce risk of respiratory infection. Education and support given; Discussed the importance of adequate rest and management of fatigue with Asthma; Screening for signs and symptoms of depression related to chronic disease state;  Assessed social determinant of health barriers;  Current hospitalization for abscess. Wife was calling to let the RNCM know that the patient was in the  hospital  Symptom Management: Take medications as prescribed   Attend all scheduled provider appointments Call provider office for new concerns or questions  call the Suicide and Crisis Lifeline: 988 call the [place] National Suicide Prevention Lifeline: (253) 327-8739 or TTY: 769-653-1329 TTY (725) 047-9158) to talk to a trained counselor call 1-800-273-TALK (toll free, 24 hour hotline) if experiencing a Mental Health or Behavioral Health Crisis   Follow Up Plan: Telephone follow up appointment with care management team member scheduled for: 10-18-2022 at 1015 am        CCM Expected Outcome:  Monitor, Self-Manage, and Reduce Symptoms of Hypertension       Current Barriers:  Knowledge Deficits related to the importance for following a heart healthy/ADA diet  and the need to restrict sodium in dietary habits  Chronic Disease Management support and education needs related to effective management of HTN  BP Readings from Last 3 Encounters:  10/11/22 128/73  10/08/22 (!) 160/78  10/01/22 102/65     Planned Interventions: Evaluation of current treatment plan related to hypertension self management and patient's adherence to plan as established by provider. The patients blood pressures are more stable now. The patient is trying to do better with his health and watching what he does. The patient is currently in the hospital, brief call with the patients wife Provided education to patient re: stroke prevention, s/s of heart attack and stroke; Reviewed prescribed diet heart healthy/ADA diet. The patient is more mindful of what he is eating and watching what he eats. He does like salt and sometimes uses too much salt. This is an ongoing obstacle but his wife states she is being diligent in trying to get him to understand the importance of not eating salt and this will help his blood pressures too.  Education and support given.  Reviewed medications with patient and discussed importance of compliance. The patient states compliance with his medications. Works with the pharm D for effective management of medications;  Discussed plans with patient for ongoing care management follow up and provided patient with direct contact information for care management team; Advised patient, providing education and rationale, to monitor blood pressure daily and record, calling PCP for findings outside established parameters;  Reviewed scheduled/upcoming provider appointments including: 06-25-2022 at 140 pm Advised patient to discuss changes in blood pressures or heart health with provider; Provided education on prescribed diet heart healthy/ADA diet. Education and support given. Review of heart healthy/ADA choices ;  Discussed complications of poorly controlled blood pressure  such as heart disease, stroke, circulatory complications, vision complications, kidney impairment, sexual dysfunction;  Screening for signs and symptoms of depression related to chronic disease state;  Assessed social determinant of health barriers;   Symptom Management: Take medications as prescribed   Attend all scheduled provider appointments Call provider office for new concerns or questions  call the Suicide and Crisis Lifeline: 988 call the [place] National Suicide Prevention Lifeline: 239-518-7411 or TTY: 807-044-3730 TTY (254)682-2089) to talk to a trained counselor call 1-800-273-TALK (toll free, 24 hour hotline) if experiencing a Mental Health or Behavioral Health Crisis  check blood pressure 3 times per week learn about high blood pressure keep a blood pressure log take blood pressure log to all doctor appointments call doctor for signs and symptoms of high blood pressure develop an action plan for high blood pressure keep all doctor appointments take medications for blood pressure exactly as prescribed report new symptoms to your doctor  Follow Up Plan: Telephone follow up appointment with care  management team member scheduled for: 10-18-2022 at 1015 am          Plan:Telephone follow up appointment with care management team member scheduled for:  10-18-2022 at 1015 am  Alto Denver RN, MSN, CCM RN Care Manager  Chronic Care Management Direct Number: [phone]

## 2022-10-11 NOTE — Inpatient Diabetes Management (Signed)
Inpatient Diabetes Program Recommendations  AACE/ADA: New Consensus Statement on Inpatient Glycemic Control (2015)  Target Ranges:  Prepandial:   less than 140 mg/dL      Peak postprandial:   less than 180 mg/dL (1-2 hours)      Critically ill patients:  140 - 180 mg/dL   Lab Results  Component Value Date   GLUCAP 212 (H) 10/11/2022   HGBA1C 9.7 (H) 10/09/2022    Review of Glycemic Control  Latest Reference Range & Units 10/09/22 11:23 10/09/22 16:32 10/09/22 21:45 10/10/22 08:35 10/10/22 16:24 10/10/22 21:13 10/11/22 07:27  Glucose-Capillary 70 - 99 mg/dL 562 (H) 130 (H) 865 (H) 201 (H) 188 (H) 293 (H) 212 (H)   Diabetes history: DM  Outpatient Diabetes medications:  Januvia 100 mg daily Humalog (Lispro) 42 units tid with meals Tresiba 40 units bid- verified with wife Jardiance 25 mg daily Current orders for Inpatient glycemic control:  Novolog 0-15 units tid with meals Semglee 20 units bid Novolog 6 units tid with meals   Inpatient Diabetes Program Recommendations:    Consider increasing Semglee to 25 units bid.  Also consider increasing Novolog meal coverage to 10 units tid with meals.    Thanks,   Beryl Meager, RN, BC-ADM Inpatient Diabetes Coordinator Pager [phone]  (8a-5p)

## 2022-10-11 NOTE — Plan of Care (Signed)
  Problem: Coping: Goal: Ability to adjust to condition or change in health will improve Outcome: Progressing   Problem: Health Behavior/Discharge Planning: Goal: Ability to identify and utilize available resources and services will improve Outcome: Progressing   Problem: Metabolic: Goal: Ability to maintain appropriate glucose levels will improve Outcome: Progressing   Problem: Nutritional: Goal: Maintenance of adequate nutrition will improve Outcome: Progressing   Problem: Education: Goal: Knowledge of General Education information will improve Description: Including pain rating scale, medication(s)/side effects and non-pharmacologic comfort measures Outcome: Progressing

## 2022-10-11 NOTE — Evaluation (Signed)
Occupational Therapy Evaluation Patient Details Name: [name]. MRN: [id number] DOB: [birth year] Today's Date: 10/11/2022   History of Present Illness 62yo male with PMhx of HTN, GERD, falls, CVA (R residual deficits), DMT2 - poorly controlled, COPD, asthma, R eye visual impairment presents to ED on 10/08/22 for swelling and drainage on the right upper back x3-4wks. Pt admitted for sepsis, cellulitis and abscess of R trapezius. Now s/p I&D.   Clinical Impression   Pt was seen for OT evaluation this date. Prior to hospital admission, pt was living in a mobile home with ramped entrance with his wife who is primarily in a wheelchair. Pt was independent (aside from med mgt which his wife completes) and would support stability of w/c and BSC when wife was transferring. Pt notes he does majority of meal prep, cleaning, and all transportation. Pt presents to acute OT demonstrating impaired ADL performance and functional mobility 2/2 decreased strength, balance, R shoulder pain and impaired R shoulder ROM (See OT problem list for additional functional deficits). Pt currently requires CGA for ADL transfers, CGA for ADL mobility with VC for RW mgt/safety, and PRN assist for LB ADL. Pt would benefit from skilled OT services to address noted impairments and functional limitations (see below for any additional details) in order to maximize safety and independence while minimizing falls risk and caregiver burden.    Recommendations for follow up therapy are one component of a multi-disciplinary discharge planning process, led by the attending physician.  Recommendations may be updated based on patient status, additional functional criteria and insurance authorization.   Assistance Recommended at Discharge Intermittent Supervision/Assistance  Patient can return home with the following A little help with walking and/or transfers;A little help with bathing/dressing/bathroom;Assistance with  cooking/housework;Assist for transportation    Functional Status Assessment  Patient has had a recent decline in their functional status and demonstrates the ability to make significant improvements in function in a reasonable and predictable amount of time.  Equipment Recommendations  Other (comment) [phone] (not rollator))    Recommendations for Other Services       Precautions / Restrictions Precautions Precautions: Fall Restrictions Weight Bearing Restrictions: No      Mobility Bed Mobility               General bed mobility comments: NT, in recliner at start and end of session    Transfers Overall transfer level: Needs assistance Equipment used: Rolling walker (2 wheels) Transfers: Sit to/from Stand Sit to Stand: Supervision, Min guard                  Balance Overall balance assessment: Needs assistance Sitting-balance support: No upper extremity supported, Feet supported Sitting balance-Leahy Scale: Good     Standing balance support: Bilateral upper extremity supported Standing balance-Leahy Scale: Fair                             ADL either performed or assessed with clinical judgement   ADL Overall ADL's : Needs assistance/impaired                                       General ADL Comments: Pt currently requires PRN MIN A for LB ADL Tasks, CGA for ADL transfers with RW, and PRN VC for safety     Vision         Perception  Praxis      Pertinent Vitals/Pain Pain Assessment Pain Assessment: 0-10 Pain Location: R shoulder "medium pain" Pain Descriptors / Indicators: Guarding, Grimacing, Aching Pain Intervention(s): Premedicated before session     Hand Dominance     Extremity/Trunk Assessment Upper Extremity Assessment Upper Extremity Assessment: Overall WFL for tasks assessed;RUE deficits/detail RUE Deficits / Details: shoulder flexion and abduction limited 2/2 wound/pain RUE: Unable to fully assess  due to pain RUE Sensation: WNL   Lower Extremity Assessment Lower Extremity Assessment: Defer to PT evaluation;Generalized weakness       Communication Communication Communication: No difficulties   Cognition Arousal/Alertness: Awake/alert Behavior During Therapy: WFL for tasks assessed/performed Overall Cognitive Status: Within Functional Limits for tasks assessed                                 General Comments: grossly WFL, slightly slow processing noted     General Comments  VSS    Exercises Other Exercises Other Exercises: Pt educated in falls prevention Other Exercises: Pt ambulated around nurses station with RW and CGA with PRN VC for safety   Shoulder Instructions      Home Living Family/patient expects to be discharged to:: Private residence Living Arrangements: Spouse/significant other Available Help at Discharge: Family Type of Home: Mobile home Home Access: Ramped entrance     Home Layout: One level     Bathroom Shower/Tub: Chief Strategy Officer: Standard     Home Equipment: BSC/3in1;Hand held Stage manager (4 wheels);Tub bench   Additional Comments: wife uses tub bench and w/c and BSC      Prior Functioning/Environment Prior Level of Function : Independent/Modified Independent;Driving;History of Falls (last six months)             Mobility Comments: rollator, 2 falls in 60mo where he got lightheaded and passed out ADLs Comments: indep, drives        OT Problem List: Decreased strength;Decreased range of motion;Impaired balance (sitting and/or standing);Decreased knowledge of use of DME or AE;Decreased safety awareness;Pain      OT Treatment/Interventions: Self-care/ADL training;Therapeutic exercise;Therapeutic activities;DME and/or AE instruction;Patient/family education;Balance training    OT Goals(Current goals can be found in the care plan section) Acute Rehab OT Goals Patient Stated Goal: go home OT  Goal Formulation: With patient Time For Goal Achievement: 10/25/22 Potential to Achieve Goals: Good ADL Goals Pt Will Transfer to Toilet: with modified independence Additional ADL Goal #1: Pt will verbalize plan to implement at least 1 learned falls prevention strategy. Additional ADL Goal #2: Pt will complete all aspects of dressing with modified independence, 2/2 opportunities.  OT Frequency: Min 1X/week    Co-evaluation              AM-PAC OT "6 Clicks" Daily Activity     Outcome Measure Help from another person eating meals?: None Help from another person taking care of personal grooming?: A Little Help from another person toileting, which includes using toliet, bedpan, or urinal?: A Little Help from another person bathing (including washing, rinsing, drying)?: A Little Help from another person to put on and taking off regular upper body clothing?: A Little Help from another person to put on and taking off regular lower body clothing?: A Little 6 Click Score: 19   End of Session Equipment Utilized During Treatment: Gait belt;Rolling walker (2 wheels) Nurse Communication: Mobility status  Activity Tolerance: Patient tolerated treatment well Patient left: in chair;with  call bell/phone within reach;with chair alarm set  OT Visit Diagnosis: Other abnormalities of gait and mobility (R26.89);Pain Pain - Right/Left: Right Pain - part of body: Shoulder                Time: 6045-4098 OT Time Calculation (min): 31 min Charges:  OT General Charges $OT Visit: 1 Visit OT Evaluation $OT Eval Low Complexity: 1 Low OT Treatments $Therapeutic Activity: 8-22 mins  Arman Filter., MPH, MS, OTR/L ascom [phone] 10/11/22, 10:17 AM

## 2022-10-11 NOTE — Patient Instructions (Signed)
Please call the care guide team at [phone] if you need to cancel or reschedule your appointment.   If you are experiencing a Mental Health or Behavioral Health Crisis or need someone to talk to, please call the Suicide and Crisis Lifeline: 988 call the [place] National Suicide Prevention Lifeline: 403-585-4483 or TTY: 575-720-4882 TTY 586-651-2164) to talk to a trained counselor call 1-800-273-TALK (toll free, 24 hour hotline)   Following is a copy of the CCM Program Consent:  CCM service includes personalized support from designated clinical staff supervised by the physician, including individualized plan of care and coordination with other care providers 24/7 contact phone numbers for assistance for urgent and routine care needs. Service will only be billed when office clinical staff spend 20 minutes or more in a month to coordinate care. Only one practitioner may furnish and bill the service in a calendar month. The patient may stop CCM services at amy time (effective at the end of the month) by phone call to the office staff. The patient will be responsible for cost sharing (co-pay) or up to 20% of the service fee (after annual deductible is met)  Following is a copy of your full provider care plan:   Goals Addressed             This Visit's Progress    CCM Expected Outcome:  Monitor, Self-Manage and Reduce Symptoms of Diabetes       Current Barriers:  Knowledge Deficits related to the need to check blood sugars and keep blood sugars in normal range Care Coordination needs related to the importance of monitoring blood sugars, eating healthy, maintaining good control of DM in a patient with DM Chronic Disease Management support and education needs related to effective management of DM Literacy barriers  Lab Results  Component Value Date   HGBA1C 9.7 (H) 10/09/2022  Saw endocrinologist recently A1C was 8.5%   Planned Interventions: Provided education to patient about basic  DM disease process. The patient needs ongoing support and education with effective management of DM. The patients wife states currently he is eating better and checking his blood sugars and writing them down. The patient is currently in the hospital due to an abscess. His wife called and talked with the RNCM. Will continue to monitor for discharge.  Reviewed medications with patient and discussed importance of medication adherence. The patient is compliant with medications. The endocrinologist wants him to wear an insulin pump. The patient and the patients wife do not feel it is safe for him to wear the pump as the patient is not able to manage the pump. The patient and his wife want a referral to a new endocrinologist as they feel that they are being pushed to do something that mentally the patient is not able to do. Reviewed prescribed diet with patient heart healthy/ADA. The patient is doing better with ADA and options that will help with maintaining healthy blood sugars but having a hard time refraining from salt ; Counseled on importance of regular laboratory monitoring as prescribed. Review of goals of A1C and the patient is hoping his next A1C is much better;        Discussed plans with patient for ongoing care management follow up and provided patient with direct contact information for care management team;      Provided patient with written educational materials related to hypo and hyperglycemia and importance of correct treatment. The patient has had a couple falls due to his blood sugars getting down  to 60's. The patients wife states he was concerned about his blood sugars being too high so he has not been eating like he was supposed to. Education on safety and monitoring for changes in the way he feels. Denies and lows. Highest he has seen is 183.        Reviewed scheduled/upcoming provider appointments including: 06-25-2022 at 140 pm Advised patient, providing education and rationale, to check cbg  before meals and at bedtime and when you have symptoms of low or high blood sugar and record. The endocrinologist had given the patient a continuous glucose reader but he kept knocking it off of his arm. He does not wish to keep wearing this. Education on monitoring for changes and letting the provider know for acute changes.   The patients stated blood sugar today at the time of the call was 157.  The patients wife gave several readings to the [facility]: [phone], and 157.       call provider for findings outside established parameters;       Review of patient status, including review of consultants reports, relevant laboratory and other test results, and medications completed;       Advised patient to discuss changes in DM, questions, or concerns with provider;     Sees eye specialist in January. Is possibly going to have new surgery to help vision  Screening for signs and symptoms of depression related to chronic disease state;        Assessed social determinant of health barriers;         Symptom Management: Take medications as prescribed   Attend all scheduled provider appointments Call provider office for new concerns or questions  call the Suicide and Crisis Lifeline: 988 call the [place] National Suicide Prevention Lifeline: (514)664-5630 or TTY: 830-453-6257 TTY (858) 056-4359) to talk to a trained counselor call 1-800-273-TALK (toll free, 24 hour hotline) if experiencing a Mental Health or Behavioral Health Crisis  keep appointment with eye doctor check feet daily for cuts, sores or redness trim toenails straight across manage portion size wash and dry feet carefully every day wear comfortable, cotton socks wear comfortable, well-fitting shoes  Follow Up Plan: Telephone follow up appointment with care management team member scheduled for: 10-18-2022 at 1015 am       CCM Expected Outcome:  Monitor, Self-Manage and Reduce Symptoms of: Asthma       Current Barriers:  Knowledge  Deficits related to triggers that can cause exacerbation of his asthma Chronic Disease Management support and education needs related to effective management of Asthma  Planned Interventions: Provided patient with basic written and verbal Asthma education on self care/management/and exacerbation prevention. Denies any issues with his breathing. The patient states his breathing is stable. Monitoring for changes in his breathing related to allergies. Denies any new concerns at this time; Advised patient to track and manage Asthma triggers. Review of the weather changes, being aware of people with infections, and monitoring for changes in his breathing;  Provided written and verbal instructions on pursed lip breathing and utilized returned demonstration as teach back; Provided instruction about proper use of medications used for management of Asthma including inhalers; Advised patient to self assesses Asthma action plan zone and make appointment with provider if in the yellow zone for 48 hours without improvement; Advised patient to engage in light exercise as tolerated 3-5 days a week to aid in the the management of Asthma; Provided education about and advised patient to utilize infection prevention strategies to  reduce risk of respiratory infection. Education and support given; Discussed the importance of adequate rest and management of fatigue with Asthma; Screening for signs and symptoms of depression related to chronic disease state;  Assessed social determinant of health barriers;  Current hospitalization for abscess. Wife was calling to let the RNCM know that the patient was in the hospital  Symptom Management: Take medications as prescribed   Attend all scheduled provider appointments Call provider office for new concerns or questions  call the Suicide and Crisis Lifeline: 988 call the [place] National Suicide Prevention Lifeline: 8563242560 or TTY: 660-207-2139 TTY 531-080-7993) to talk  to a trained counselor call 1-800-273-TALK (toll free, 24 hour hotline) if experiencing a Mental Health or Behavioral Health Crisis   Follow Up Plan: Telephone follow up appointment with care management team member scheduled for: 10-18-2022 at 1015 am        CCM Expected Outcome:  Monitor, Self-Manage, and Reduce Symptoms of Hypertension       Current Barriers:  Knowledge Deficits related to the importance for following a heart healthy/ADA diet and the need to restrict sodium in dietary habits  Chronic Disease Management support and education needs related to effective management of HTN  BP Readings from Last 3 Encounters:  10/11/22 128/73  10/08/22 (!) 160/78  10/01/22 102/65     Planned Interventions: Evaluation of current treatment plan related to hypertension self management and patient's adherence to plan as established by provider. The patients blood pressures are more stable now. The patient is trying to do better with his health and watching what he does. The patient is currently in the hospital, brief call with the patients wife Provided education to patient re: stroke prevention, s/s of heart attack and stroke; Reviewed prescribed diet heart healthy/ADA diet. The patient is more mindful of what he is eating and watching what he eats. He does like salt and sometimes uses too much salt. This is an ongoing obstacle but his wife states she is being diligent in trying to get him to understand the importance of not eating salt and this will help his blood pressures too.  Education and support given.  Reviewed medications with patient and discussed importance of compliance. The patient states compliance with his medications. Works with the pharm D for effective management of medications;  Discussed plans with patient for ongoing care management follow up and provided patient with direct contact information for care management team; Advised patient, providing education and rationale, to  monitor blood pressure daily and record, calling PCP for findings outside established parameters;  Reviewed scheduled/upcoming provider appointments including: 06-25-2022 at 140 pm Advised patient to discuss changes in blood pressures or heart health with provider; Provided education on prescribed diet heart healthy/ADA diet. Education and support given. Review of heart healthy/ADA choices ;  Discussed complications of poorly controlled blood pressure such as heart disease, stroke, circulatory complications, vision complications, kidney impairment, sexual dysfunction;  Screening for signs and symptoms of depression related to chronic disease state;  Assessed social determinant of health barriers;   Symptom Management: Take medications as prescribed   Attend all scheduled provider appointments Call provider office for new concerns or questions  call the Suicide and Crisis Lifeline: 988 call the [place] National Suicide Prevention Lifeline: (838)677-5450 or TTY: 6090685588 TTY 701-753-9072) to talk to a trained counselor call 1-800-273-TALK (toll free, 24 hour hotline) if experiencing a Mental Health or Behavioral Health Crisis  check blood pressure 3 times per week learn about high blood pressure  keep a blood pressure log take blood pressure log to all doctor appointments call doctor for signs and symptoms of high blood pressure develop an action plan for high blood pressure keep all doctor appointments take medications for blood pressure exactly as prescribed report new symptoms to your doctor  Follow Up Plan: Telephone follow up appointment with care management team member scheduled for: 10-18-2022 at 1015 am          The patient verbalized understanding of instructions, educational materials, and care plan provided today and DECLINED offer to receive copy of patient instructions, educational materials, and care plan.  Telephone follow up appointment with care management team member  scheduled for: 10-18-2022 at 1015 am

## 2022-10-11 NOTE — Patient Instructions (Signed)
Visit Information  Thank you for taking time to visit with me today. Please don't hesitate to contact me if I can be of assistance to you.   Following are the goals we discussed today:  Please follow up with the [facility] Business office to apply for charity care Please follow up with status of Medicaid application   Our next appointment is by telephone on 10/17/22 at 3pm  Please call the care guide team at [phone] if you need to cancel or reschedule your appointment.   If you are experiencing a Mental Health or Behavioral Health Crisis or need someone to talk to, please call 911   Patient verbalizes understanding of instructions and care plan provided today and agrees to view in MyChart. Active MyChart status and patient understanding of how to access instructions and care plan via MyChart confirmed with patient.     Telephone follow up appointment with care management team member scheduled for: 10/17/22  Verna Czech, LCSW Clinical Social Worker  [facility] Care Management [phone]

## 2022-10-11 NOTE — TOC Progression Note (Signed)
Transition of Care (TOC) - Progression Note    Patient Details  Name: [name]. MRN: [id number] Date of Birth: [birth year]  Transition of Care [facility]) CM/SW Contact  Margarito Liner, LCSW Phone Number: 10/11/2022, 12:32 PM  Clinical Narrative:   [employer] Kirks, and [employer] do not provide standard walkers with 4 wheels. CSW asked patient if he prefers RW or rollator. CSW ordered rollator through [employer]. Patient also needs clothes to return home. CSW obtained shirt and pants from clothing closet. Patient signed charity form. His ride home is at the bedside.  Expected Discharge Plan: Home/Self Care Barriers to Discharge: Continued Medical Work up  Expected Discharge Plan and Services       Living arrangements for the past 2 months: Single Family Home                                       Social Determinants of Health (SDOH) Interventions SDOH Screenings   Food Insecurity: No Food Insecurity (10/11/2022)  Housing: Patient Declined (10/09/2022)  Transportation Needs: No Transportation Needs (10/11/2022)  Utilities: Not At Risk (10/11/2022)  Alcohol Screen: Low Risk  (06/26/2022)  Depression (PHQ2-9): High Risk (10/01/2022)  Financial Resource Strain: Low Risk  (06/26/2022)  Recent Concern: Financial Resource Strain - High Risk (06/25/2022)  Physical Activity: Inactive (06/26/2022)  Social Connections: Socially Isolated (06/26/2022)  Stress: No Stress Concern Present (06/26/2022)  Recent Concern: Stress - Stress Concern Present (06/25/2022)  Tobacco Use: Low Risk  (10/08/2022)  Health Literacy: Inadequate Health Literacy (10/11/2022)    Readmission Risk Interventions     No data to display

## 2022-10-11 NOTE — Patient Outreach (Signed)
  Care Coordination   Initial Visit Note   10/11/2022 Name: [name]. MRN: [id number] DOB: 01-Mar-1961  [name] is a 62 y.o. year old male who sees [name], Corrie Dandy T, NP for primary care. I spoke with  Jacqlyn Krauss Jr.'s spouse by phone today.  What matters to the patients health and wellness today?  Patient's spouse describes financial challenges related to medical, pharmacy bills and daily expenses.   Goals Addressed             This Visit's Progress    Financial challenges       Interventions Today    Flowsheet Row Most Recent Value  Chronic Disease   Chronic disease during today's visit Diabetes  [Diabetes, finanicial challenges]  General Interventions   General Interventions Discussed/Reviewed Level of Care  Communication with --  [spouse, patient currently in the hospital. Spouse confirms financial challenges:medical bills, pharmacy bills, rent increased-discussed reaching out to Business Office to apply for charity care-spouse is patient's payee]  Level of Care Applications  Applications Medicaid  [Per patient's spouse, patient's father helped patient to apply -status pending]  Education Interventions   Applications Medicaid  [Per patient's spouse, patient's father helped patient to apply -status pending]  Nutrition Interventions   Nutrition Discussed/Reviewed Nutrition Discussed  [Receives food stamps, receives food from [employer] and area ministries]  Pharmacy Interventions   Pharmacy Dicussed/Reviewed Pharmacy Topics Discussed, Affording Medications, Referral to Pharmacist  Referral to Pharmacist Cannot afford medications           Patient Stated          SDOH assessments and interventions completed:  Yes  SDOH Interventions Today    Flowsheet Row Most Recent Value  SDOH Interventions   Food Insecurity Interventions Intervention Not Indicated  [receives food stamps and meals from [employer] meals]  Transportation  Interventions Intervention Not Indicated  Utilities Interventions Intervention Not Indicated        Care Coordination Interventions:  Yes, provided   Follow up plan: Follow up call scheduled for 10/16/22    Encounter Outcome:  Pt. Visit Completed

## 2022-10-11 NOTE — TOC Transition Note (Addendum)
Transition of Care [facility]) - CM/SW Discharge Note   Patient Details  Name: [name]. MRN: [id number] Date of Birth: [birth year]  Transition of Care [facility]) CM/SW Contact:  Margarito Liner, LCSW Phone Number: 10/11/2022, 12:54 PM   Clinical Narrative:   Patient has orders to discharge home today. No further concerns. CSW signing off.  1:15 pm: Per [employer], patient cancelled rollator order due to having walkers at home. MD and RN are aware.  Final next level of care: Home/Self Care Barriers to Discharge: Barriers Resolved   Patient Goals and CMS Choice CMS Medicare.gov Compare Post Acute Care list provided to:: Patient Choice offered to / list presented to : Patient  Discharge Placement                  Patient to be transferred to facility by: Father Name of family member notified: [name] Patient and family notified of of transfer: 10/11/22  Discharge Plan and Services Additional resources added to the After Visit Summary for                  DME Arranged: Walker rolling with seat DME Agency: [employer] Date DME Agency Contacted: 10/11/22   Representative spoke with at DME Agency: Yvone Neu            Social Determinants of Health (SDOH) Interventions SDOH Screenings   Food Insecurity: No Food Insecurity (10/11/2022)  Housing: Patient Declined (10/09/2022)  Transportation Needs: No Transportation Needs (10/11/2022)  Utilities: Not At Risk (10/11/2022)  Alcohol Screen: Low Risk  (06/26/2022)  Depression (PHQ2-9): High Risk (10/01/2022)  Financial Resource Strain: Low Risk  (06/26/2022)  Recent Concern: Financial Resource Strain - High Risk (06/25/2022)  Physical Activity: Inactive (06/26/2022)  Social Connections: Socially Isolated (06/26/2022)  Stress: No Stress Concern Present (06/26/2022)  Recent Concern: Stress - Stress Concern Present (06/25/2022)  Tobacco Use: Low Risk  (10/08/2022)  Health Literacy: Inadequate Health Literacy (10/11/2022)      Readmission Risk Interventions     No data to display

## 2022-10-12 ENCOUNTER — Ambulatory Visit: Payer: Self-pay

## 2022-10-12 DIAGNOSIS — E1165 Type 2 diabetes mellitus with hyperglycemia: Secondary | ICD-10-CM

## 2022-10-12 DIAGNOSIS — J4540 Moderate persistent asthma, uncomplicated: Secondary | ICD-10-CM

## 2022-10-12 DIAGNOSIS — I152 Hypertension secondary to endocrine disorders: Secondary | ICD-10-CM

## 2022-10-12 DIAGNOSIS — Z09 Encounter for follow-up examination after completed treatment for conditions other than malignant neoplasm: Secondary | ICD-10-CM

## 2022-10-12 NOTE — Transitions of Care (Post Inpatient/ED Visit) (Signed)
10/12/2022  Name: [name]. MRN: [id number] DOB: 1960-08-07  Today's TOC FU Call Status: Today's TOC FU Call Status:: Successful TOC FU Call Competed TOC FU Call Complete Date: 10/12/22  Transition Care Management Follow-up Telephone Call Date of Discharge: 10/11/22 Discharge Facility: [facility]) Type of Discharge: Inpatient Admission Primary Inpatient Discharge Diagnosis:: Cellulitis and abscess of trunk  Principal problem How have you been since you were released from the hospital?: Better Any questions or concerns?: No  Items Reviewed: Did you receive and understand the discharge instructions provided?: Yes Medications obtained,verified, and reconciled?: Yes (Medications Reviewed) Any new allergies since your discharge?: No Dietary orders reviewed?: Yes Type of Diet Ordered:: heart healthy/ADA Diet Do you have support at home?: Yes People in Home: spouse Name of Support/Comfort Primary Source: [name]- wife  Medications Reviewed Today: Medications Reviewed Today     Reviewed by Marlowe Sax, RN (Case Manager) on 10/12/22 at 1401  Med List Status: <None>   Medication Order Taking? Sig Documenting Provider Last Dose Status Informant  albuterol (VENTOLIN HFA) 108 (90 Base) MCG/ACT inhaler [id number] No Inhale 2 puffs into the lungs every 6 (six) hours as needed.  Patient not taking: Reported on 10/09/2022   Aura Dials T, NP Not Taking Active Spouse/Significant Other  aspirin 81 MG EC tablet [id number] No Take 1 tablet (81 mg total) by mouth daily. Aura Dials T, NP 10/07/2022 unknown Active Spouse/Significant Other  atorvastatin (LIPITOR) 80 MG tablet [id number] No Take 80 mg by mouth daily. [provider] 10/07/2022 unknown Active Spouse/Significant Other  Blood Glucose Monitoring Suppl (ONE TOUCH ULTRA 2) w/Device KIT [id number] No Use to check blood sugar 4 times a day Cannady, Jolene T, NP Taking Active   clopidogrel  (PLAVIX) 75 MG tablet [id number] No Take 1 tablet (75 mg total) by mouth daily. Aura Dials T, NP 10/07/2022 unknown Active Spouse/Significant Other  doxycycline (ADOXA) 100 MG tablet [id number]  Take 1 tablet (100 mg total) by mouth 2 (two) times daily for 8 days. Charise Killian, MD  Active   empagliflozin (JARDIANCE) 25 MG TABS tablet [id number] No Take 1 tablet (25 mg total) by mouth daily. Aura Dials T, NP 10/07/2022 unknown Active Spouse/Significant Other  EPINEPHRINE 0.3 mg/0.3 mL IJ SOAJ injection [id number] No INJECT 1 SYRINGE INTO OUTER THIGH ONCE AS NEEDED FOR SEVERE ALLERGIC REACTION.  Patient taking differently: Inject 0.3 mg into the muscle as needed.   Aura Dials T, NP prn prn Active Spouse/Significant Other  fenofibrate 54 MG tablet [id number] No Take 1 tablet (54 mg total) by mouth daily. Aura Dials T, NP 10/07/2022 unknown Active Spouse/Significant Other  fluticasone furoate-vilanterol (BREO ELLIPTA) 100-25 MCG/ACT AEPB [id number] No Inhale 1 puff into the lungs daily. Aura Dials T, NP unknown unknown Active Spouse/Significant Other  gabapentin (NEURONTIN) 300 MG capsule [id number] No Take 1 capsule (300 mg total) by mouth 2 (two) times daily. Aura Dials T, NP 10/07/2022 unknown Active Spouse/Significant Other  glucose blood (ONETOUCH ULTRA) test strip [id number] No TEST BLOOD SUGAR 5 TIMES A DAY Cannady, Jolene T, NP unknown Active   HYDROcodone-acetaminophen (NORCO/VICODIN) 5-325 MG tablet [id number] No Take 1 tablet by mouth every 6 (six) hours as needed for moderate pain. [provider] prn prn Active Spouse/Significant Other  icosapent Ethyl (VASCEPA) 1 g capsule [id number] No Take 1 capsule (1 g total) by mouth 2 (two) times daily. Aura Dials T, NP 10/07/2022 unknown Active Spouse/Significant Other  Insulin Degludec (TRESIBA) 100 UNIT/ML SOLN  [id number] No Inject 40 Units into the skin 2 (two) times daily. Aura Dials T, NP 10/07/2022 unknown  Active Spouse/Significant Other  Insulin Disposable Pump (OMNIPOD DASH PDM, GEN 4,) KIT [id number] No AS PRESCRIBED USE AS DIRECTED BASED ON TOTAL DAILY INSULIN AND POD CHANGE FREQUENCY [provider] Taking Active Spouse/Significant Other  Insulin Lispro Junior KwikPen (HUMALOG JR) 100 UNIT/ML KwikPen [id number] No Inject 42 Units into the skin 3 (three) times daily before meals. [provider] 10/07/2022 unknown Active Spouse/Significant Other  Insulin Pen Needle (PEN NEEDLES) 32G X 4 MM MISC [id number] No 1 kit by Does not apply route daily. Aura Dials T, NP Taking Active   lisinopril (ZESTRIL) 20 MG tablet [id number] No Take 1 tablet (20 mg total) by mouth daily. Aura Dials T, NP 10/07/2022 unknown Active Spouse/Significant Other  Misc. Devices (PULSE OXIMETER FOR FINGER) MISC [id number] No To check O2 saturations once daily with asthma and document + check if any shortness of breath Cannady, Jolene T, NP Taking Active   montelukast (SINGULAIR) 10 MG tablet [id number] No Take 1 tablet (10 mg total) by mouth at bedtime. Aura Dials T, NP 10/07/2022 unknown Active Spouse/Significant Other  mupirocin ointment (BACTROBAN) 2 % [id number] No Apply 1 Application topically 2 (two) times daily. Aura Dials T, NP 10/07/2022 unknown Active Spouse/Significant Other  nitroGLYCERIN (NITROSTAT) 0.4 MG SL tablet [id number] No Place 1 tablet (0.4 mg total) under the tongue every 5 (five) minutes as needed for chest pain. Aura Dials T, NP prn prn Active Spouse/Significant Other  omeprazole (PRILOSEC) 20 MG capsule [id number] No Take 1 capsule (20 mg total) by mouth daily. Aura Dials T, NP 10/07/2022 Active Spouse/Significant Other  OneTouch Delica Lancets 33G MISC [id number] No USE AS DIRECTED Cannady, Jolene T, NP Taking Active   rosuvastatin (CRESTOR) 40 MG tablet [id number] No Take 1 tablet (40 mg total) by mouth daily. Aura Dials T, NP 10/07/2022 unknown Active Spouse/Significant  Other  sitaGLIPtin (JANUVIA) 100 MG tablet [id number] No Take 1 tablet (100 mg total) by mouth daily. Aura Dials T, NP 10/07/2022 unknown Active Spouse/Significant Other  tiZANidine (ZANAFLEX) 2 MG tablet [id number] No Take 1 tablet (2 mg total) by mouth every 6 (six) hours as needed for muscle spasms.  Patient taking differently: Take 2 mg by mouth every 6 (six) hours.   Aura Dials T, NP prn prn Active Spouse/Significant Other  traMADol (ULTRAM) 50 MG tablet [id number] No Take 50 mg by mouth every 6 (six) hours as needed for moderate pain. [provider] prn prn Active Spouse/Significant Other  triamcinolone cream (KENALOG) 0.1 % [id number] No Apply 1 Application topically 2 (two) times daily.  Patient taking differently: Apply 1 Application topically 2 (two) times daily. Arms and legs   Cannady, Jolene T, NP prn prn Active Spouse/Significant Other  ULTRACARE PEN NEEDLES 32G X 5 MM MISC [id number] No  [provider] Taking Active   valACYclovir (VALTREX) 500 MG tablet [id number] No Take 500 mg by mouth daily. [provider] 10/07/2022 unknown Active Spouse/Significant Other  Vitamin D, Ergocalciferol, (DRISDOL) 1.25 MG (50000 UNIT) CAPS capsule [id number] No Take 50,000 Units by mouth once a week. [provider] 10/01/2022 unknown Active Spouse/Significant Other           Med Note Michaelle Copas, TIFFANY N   Tue Oct 09, 2022 12:11 AM) Leanora Ivanoff on Mondays.            Home Care and Equipment/Supplies: Were Home Health Services Ordered?: No (the patient refused home  health) Any new equipment or medical supplies ordered?: Yes Name of Medical supply agency?: ordered a walker, but the patient did not need because he has 4 walkers Were you able to get the equipment/medical supplies?: Yes Do you have any questions related to the use of the equipment/supplies?: No  Functional Questionnaire: Do you need assistance with bathing/showering or dressing?: No Do you need  assistance with meal preparation?: No Do you need assistance with eating?: No Do you have difficulty maintaining continence: No Do you need assistance with getting out of bed/getting out of a chair/moving?: No Do you have difficulty managing or taking your medications?: No  Follow up appointments reviewed: PCP Follow-up appointment confirmed?: No MD Provider Line Number:[phone] Given: Yes (reminded the patients wife to call the office and get an appointment) Specialist Hospital Follow-up appointment confirmed?: No Reason Specialist Follow-Up Not Confirmed: Patient has Specialist Provider Number and will Call for Appointment (Dr. Wynelle Link- cardiologist within a week) Do you need transportation to your follow-up appointment?: No Do you understand care options if your condition(s) worsen?: Yes-patient verbalized understanding    Alto Denver RN, MSN, CCM RN Care Manager  Chronic Care Management Direct Number: [phone]

## 2022-10-12 NOTE — Patient Instructions (Signed)
Please call the care guide team at [phone] if you need to cancel or reschedule your appointment.   If you are experiencing a Mental Health or Behavioral Health Crisis or need someone to talk to, please call the Suicide and Crisis Lifeline: 988 call the [place] National Suicide Prevention Lifeline: 219-304-2836 or TTY: 931-157-8058 TTY 203-282-6283) to talk to a trained counselor call 1-800-273-TALK (toll free, 24 hour hotline)   Following is a copy of the CCM Program Consent:  CCM service includes personalized support from designated clinical staff supervised by the physician, including individualized plan of care and coordination with other care providers 24/7 contact phone numbers for assistance for urgent and routine care needs. Service will only be billed when office clinical staff spend 20 minutes or more in a month to coordinate care. Only one practitioner may furnish and bill the service in a calendar month. The patient may stop CCM services at amy time (effective at the end of the month) by phone call to the office staff. The patient will be responsible for cost sharing (co-pay) or up to 20% of the service fee (after annual deductible is met)  Following is a copy of your full provider care plan:  Today's TOC FU Call Status: Today's TOC FU Call Status:: Successful TOC FU Call Competed TOC FU Call Complete Date: 10/12/22   Transition Care Management Follow-up Telephone Call Date of Discharge: 10/11/22 Discharge Facility: [facility]) Type of Discharge: Inpatient Admission Primary Inpatient Discharge Diagnosis:: Cellulitis and abscess of trunk  Principal problem How have you been since you were released from the hospital?: Better Any questions or concerns?: No   Items Reviewed: Did you receive and understand the discharge instructions provided?: Yes Medications obtained,verified, and reconciled?: Yes (Medications Reviewed) Any new allergies since  your discharge?: No Dietary orders reviewed?: Yes Type of Diet Ordered:: heart healthy/ADA Diet Do you have support at home?: Yes People in Home: spouse Name of Support/Comfort Primary Source: [name]- wife   Medications Reviewed Today: Medications Reviewed Today       Reviewed by Marlowe Sax, RN (Case Manager) on 10/12/22 at 1401  Med List Status: <None>    Medication Order Taking? Sig Documenting Provider Last Dose Status Informant  albuterol (VENTOLIN HFA) 108 (90 Base) MCG/ACT inhaler [id number] No Inhale 2 puffs into the lungs every 6 (six) hours as needed.  Patient not taking: Reported on 10/09/2022   Aura Dials T, NP Not Taking Active Spouse/Significant Other  aspirin 81 MG EC tablet [id number] No Take 1 tablet (81 mg total) by mouth daily. Aura Dials T, NP 10/07/2022 unknown Active Spouse/Significant Other  atorvastatin (LIPITOR) 80 MG tablet [id number] No Take 80 mg by mouth daily. [provider] 10/07/2022 unknown Active Spouse/Significant Other  Blood Glucose Monitoring Suppl (ONE TOUCH ULTRA 2) w/Device KIT [id number] No Use to check blood sugar 4 times a day Cannady, Jolene T, NP Taking Active    clopidogrel (PLAVIX) 75 MG tablet [id number] No Take 1 tablet (75 mg total) by mouth daily. Aura Dials T, NP 10/07/2022 unknown Active Spouse/Significant Other  doxycycline (ADOXA) 100 MG tablet [id number]   Take 1 tablet (100 mg total) by mouth 2 (two) times daily for 8 days. Charise Killian, MD   Active    empagliflozin (JARDIANCE) 25 MG TABS tablet [id number] No Take 1 tablet (25 mg total) by mouth daily. Aura Dials T, NP 10/07/2022 unknown Active Spouse/Significant Other  EPINEPHRINE 0.3 mg/0.3 mL IJ SOAJ injection [id number] No INJECT  1 SYRINGE INTO OUTER THIGH ONCE AS NEEDED FOR SEVERE ALLERGIC REACTION.  Patient taking differently: Inject 0.3 mg into the muscle as needed.   Aura Dials T, NP prn prn Active Spouse/Significant Other  fenofibrate 54 MG  tablet [id number] No Take 1 tablet (54 mg total) by mouth daily. Aura Dials T, NP 10/07/2022 unknown Active Spouse/Significant Other  fluticasone furoate-vilanterol (BREO ELLIPTA) 100-25 MCG/ACT AEPB [id number] No Inhale 1 puff into the lungs daily. Aura Dials T, NP unknown unknown Active Spouse/Significant Other  gabapentin (NEURONTIN) 300 MG capsule [id number] No Take 1 capsule (300 mg total) by mouth 2 (two) times daily. Aura Dials T, NP 10/07/2022 unknown Active Spouse/Significant Other  glucose blood (ONETOUCH ULTRA) test strip [id number] No TEST BLOOD SUGAR 5 TIMES A DAY Cannady, Jolene T, NP unknown Active    HYDROcodone-acetaminophen (NORCO/VICODIN) 5-325 MG tablet [id number] No Take 1 tablet by mouth every 6 (six) hours as needed for moderate pain. [provider] prn prn Active Spouse/Significant Other  icosapent Ethyl (VASCEPA) 1 g capsule [id number] No Take 1 capsule (1 g total) by mouth 2 (two) times daily. Aura Dials T, NP 10/07/2022 unknown Active Spouse/Significant Other  Insulin Degludec (TRESIBA) 100 UNIT/ML SOLN [id number] No Inject 40 Units into the skin 2 (two) times daily. Aura Dials T, NP 10/07/2022 unknown Active Spouse/Significant Other  Insulin Disposable Pump (OMNIPOD DASH PDM, GEN 4,) KIT [id number] No AS PRESCRIBED USE AS DIRECTED BASED ON TOTAL DAILY INSULIN AND POD CHANGE FREQUENCY [provider] Taking Active Spouse/Significant Other  Insulin Lispro Junior KwikPen (HUMALOG JR) 100 UNIT/ML KwikPen [id number] No Inject 42 Units into the skin 3 (three) times daily before meals. [provider] 10/07/2022 unknown Active Spouse/Significant Other  Insulin Pen Needle (PEN NEEDLES) 32G X 4 MM MISC [id number] No 1 kit by Does not apply route daily. Aura Dials T, NP Taking Active    lisinopril (ZESTRIL) 20 MG tablet [id number] No Take 1 tablet (20 mg total) by mouth daily. Aura Dials T, NP 10/07/2022 unknown Active Spouse/Significant Other   Misc. Devices (PULSE OXIMETER FOR FINGER) MISC [id number] No To check O2 saturations once daily with asthma and document + check if any shortness of breath Cannady, Jolene T, NP Taking Active    montelukast (SINGULAIR) 10 MG tablet [id number] No Take 1 tablet (10 mg total) by mouth at bedtime. Aura Dials T, NP 10/07/2022 unknown Active Spouse/Significant Other  mupirocin ointment (BACTROBAN) 2 % [id number] No Apply 1 Application topically 2 (two) times daily. Aura Dials T, NP 10/07/2022 unknown Active Spouse/Significant Other  nitroGLYCERIN (NITROSTAT) 0.4 MG SL tablet [id number] No Place 1 tablet (0.4 mg total) under the tongue every 5 (five) minutes as needed for chest pain. Aura Dials T, NP prn prn Active Spouse/Significant Other  omeprazole (PRILOSEC) 20 MG capsule [id number] No Take 1 capsule (20 mg total) by mouth daily. Aura Dials T, NP 10/07/2022 Active Spouse/Significant Other  OneTouch Delica Lancets 33G MISC [id number] No USE AS DIRECTED Cannady, Jolene T, NP Taking Active    rosuvastatin (CRESTOR) 40 MG tablet [id number] No Take 1 tablet (40 mg total) by mouth daily. Aura Dials T, NP 10/07/2022 unknown Active Spouse/Significant Other  sitaGLIPtin (JANUVIA) 100 MG tablet [id number] No Take 1 tablet (100 mg total) by mouth daily. Aura Dials T, NP 10/07/2022 unknown Active Spouse/Significant Other  tiZANidine (ZANAFLEX) 2 MG tablet [id number] No Take 1 tablet (2 mg total) by mouth every 6 (six) hours as needed for muscle spasms.  Patient taking differently: Take 2 mg  by mouth every 6 (six) hours.   Aura Dials T, NP prn prn Active Spouse/Significant Other  traMADol (ULTRAM) 50 MG tablet [id number] No Take 50 mg by mouth every 6 (six) hours as needed for moderate pain. [provider] prn prn Active Spouse/Significant Other  triamcinolone cream (KENALOG) 0.1 % [id number] No Apply 1 Application topically 2 (two) times daily.  Patient taking differently: Apply 1  Application topically 2 (two) times daily. Arms and legs   Cannady, Jolene T, NP prn prn Active Spouse/Significant Other  ULTRACARE PEN NEEDLES 32G X 5 MM MISC [id number] No   [provider] Taking Active    valACYclovir (VALTREX) 500 MG tablet [id number] No Take 500 mg by mouth daily. [provider] 10/07/2022 unknown Active Spouse/Significant Other  Vitamin D, Ergocalciferol, (DRISDOL) 1.25 MG (50000 UNIT) CAPS capsule [id number] No Take 50,000 Units by mouth once a week. [provider] 10/01/2022 unknown Active Spouse/Significant Other                   Med Note Michaelle Copas, TIFFANY N   Tue Oct 09, 2022 12:11 AM) Leanora Ivanoff on Mondays.                  Home Care and Equipment/Supplies: Were Home Health Services Ordered?: No (the patient refused home health) Any new equipment or medical supplies ordered?: Yes Name of Medical supply agency?: ordered a walker, but the patient did not need because he has 4 walkers Were you able to get the equipment/medical supplies?: Yes Do you have any questions related to the use of the equipment/supplies?: No   Functional Questionnaire: Do you need assistance with bathing/showering or dressing?: No Do you need assistance with meal preparation?: No Do you need assistance with eating?: No Do you have difficulty maintaining continence: No Do you need assistance with getting out of bed/getting out of a chair/moving?: No Do you have difficulty managing or taking your medications?: No   Follow up appointments reviewed: PCP Follow-up appointment confirmed?: No MD Provider Line [phone] Given: Yes (reminded the patients wife to call the office and get an appointment) Specialist Hospital Follow-up appointment confirmed?: No Reason Specialist Follow-Up Not Confirmed: Patient has Specialist Provider Number and will Call for Appointment (Dr. Wynelle Link- cardiologist within a week) Do you need transportation to your follow-up  appointment?: No Do you understand care options if your condition(s) worsen?: Yes-patient verbalized understanding       The patient verbalized understanding of instructions, educational materials, and care plan provided today and DECLINED offer to receive copy of patient instructions, educational materials, and care plan.  Telephone follow up appointment with care management team member scheduled for: 10-18-2022

## 2022-10-17 ENCOUNTER — Telehealth: Payer: Self-pay | Admitting: *Deleted

## 2022-10-17 ENCOUNTER — Encounter: Payer: Self-pay | Admitting: *Deleted

## 2022-10-17 ENCOUNTER — Telehealth: Payer: Self-pay | Admitting: Gastroenterology

## 2022-10-17 DIAGNOSIS — J45909 Unspecified asthma, uncomplicated: Secondary | ICD-10-CM | POA: Diagnosis not present

## 2022-10-17 DIAGNOSIS — E1159 Type 2 diabetes mellitus with other circulatory complications: Secondary | ICD-10-CM

## 2022-10-17 DIAGNOSIS — I10 Essential (primary) hypertension: Secondary | ICD-10-CM

## 2022-10-17 NOTE — Telephone Encounter (Signed)
The patient and his wife ([name]) called in to schedule his follow up appointment from his hospital stay.

## 2022-10-17 NOTE — Patient Outreach (Signed)
  Care Coordination   10/17/2022 Name: [name]. MRN: [id number] DOB: 26-Jun-1960   Care Coordination Outreach Attempts:  An unsuccessful telephone outreach was attempted for a scheduled appointment today.  Follow Up Plan:  Additional outreach attempts will be made to offer the patient care coordination information and services.   Encounter Outcome:  No Answer   Care Coordination Interventions:  No, not indicated    Aryona Sill, LCSW Clinical Social Worker  [facility] Care Management [phone]

## 2022-10-18 ENCOUNTER — Telehealth: Payer: Medicare Other

## 2022-10-18 ENCOUNTER — Other Ambulatory Visit: Payer: Self-pay

## 2022-10-18 ENCOUNTER — Telehealth: Payer: Self-pay

## 2022-10-18 ENCOUNTER — Other Ambulatory Visit: Payer: Medicare Other

## 2022-10-18 NOTE — Patient Instructions (Signed)
Visit Information  Thank you for taking time to visit with me today. Please don't hesitate to contact me if I can be of assistance to you before our next scheduled telephone appointment.  Following are the goals we discussed today:   Goals Addressed             This Visit's Progress    RNCM Care Management  Expected Outcome:  Monitor, Self-Manage and Reduce Symptoms of Diabetes       Current Barriers:  Knowledge Deficits related to the need to check blood sugars and keep blood sugars in normal range Care Coordination needs related to the importance of monitoring blood sugars, eating healthy, maintaining good control of DM in a patient with DM Chronic Disease Management support and education needs related to effective management of DM Literacy barriers  Lab Results  Component Value Date   HGBA1C 9.7 (H) 10/09/2022  Saw endocrinologist recently A1C was 8.5%   Planned Interventions: Provided education to patient about basic DM disease process. The patient needs ongoing support and education with effective management of DM. The patients wife states currently he is eating better and checking his blood sugars and writing them down. The patient has been having up and down blood sugars. The patients ate an egg sandwich this am and it went up to 300. Education on monitoring for changes and letting the pcp know.  Reviewed medications with patient and discussed importance of medication adherence. The patient is compliant with medications. The endocrinologist wants him to wear an insulin pump. The patient and the patients wife do not feel it is safe for him to wear the pump as the patient is not able to manage the pump. The patient is taking his medications as directed Reviewed prescribed diet with patient heart healthy/ADA. The patient is doing better with ADA and options that will help with maintaining healthy blood sugars but having a hard time refraining from salt ; Counseled on importance of  regular laboratory monitoring as prescribed. Review of goals of A1C and the patient is hoping his next A1C is much better;        Discussed plans with patient for ongoing care management follow up and provided patient with direct contact information for care management team;      Provided patient with written educational materials related to hypo and hyperglycemia and importance of correct treatment. The patient has had a couple falls due to his blood sugars getting down to 40 last night. The patients wife states he was concerned about his blood sugars being too high so he has not been eating like he was supposed to. Education on safety and monitoring for changes in the way he feels. Denies and lows. Highest he has seen is >300.        Reviewed scheduled/upcoming provider appointments including:10-31-2022 Advised patient, providing education and rationale, to check cbg before meals and at bedtime and when you have symptoms of low or high blood sugar and record. The endocrinologist had given the patient a continuous glucose reader but he kept knocking it off of his arm. He does not wish to keep wearing this. Education on monitoring for changes and letting the provider know for acute changes.   The patients stated blood sugar today at the time of the call was 253.   call provider for findings outside established parameters;       Review of patient status, including review of consultants reports, relevant laboratory and other test results, and medications completed;  Advised patient to discuss changes in DM, questions, or concerns with provider;     Sees eye specialist in January. Is possibly going to have new surgery to help vision  Screening for signs and symptoms of depression related to chronic disease state;        Assessed social determinant of health barriers;         Symptom Management: Take medications as prescribed   Attend all scheduled provider appointments Call provider office for new  concerns or questions  call the Suicide and Crisis Lifeline: 988 call the [place] National Suicide Prevention Lifeline: 929 228 9023 or TTY: 878-317-7403 TTY (458) 278-2353) to talk to a trained counselor call 1-800-273-TALK (toll free, 24 hour hotline) if experiencing a Mental Health or Behavioral Health Crisis  keep appointment with eye doctor check feet daily for cuts, sores or redness trim toenails straight across manage portion size wash and dry feet carefully every day wear comfortable, cotton socks wear comfortable, well-fitting shoes  Follow Up Plan: Telephone follow up appointment with care management team member scheduled for: 11-16-2022 at 130 pm       RNCM Care Management  Expected Outcome:  Monitor, Self-Manage and Reduce Symptoms of: Asthma       Current Barriers:  Knowledge Deficits related to triggers that can cause exacerbation of his asthma Chronic Disease Management support and education needs related to effective management of Asthma  Planned Interventions: Provided patient with basic written and verbal Asthma education on self care/management/and exacerbation prevention. Denies any issues with his breathing. The patient states his breathing is stable. Monitoring for changes in his breathing related to allergies. Denies any new concerns at this time. Encouraged the patient not to go outside when it is hot. He wants to mow his yard. The patient was recently in the hospital for an abscess.  Advised patient to track and manage Asthma triggers. Review of the weather changes, being aware of people with infections, and monitoring for changes in his breathing;  Provided written and verbal instructions on pursed lip breathing and utilized returned demonstration as teach back; Provided instruction about proper use of medications used for management of Asthma including inhalers; Advised patient to self assesses Asthma action plan zone and make appointment with provider if in the  yellow zone for 48 hours without improvement; Advised patient to engage in light exercise as tolerated 3-5 days a week to aid in the the management of Asthma; Provided education about and advised patient to utilize infection prevention strategies to reduce risk of respiratory infection. Education and support given; Discussed the importance of adequate rest and management of fatigue with Asthma; Screening for signs and symptoms of depression related to chronic disease state;  Assessed social determinant of health barriers;    Symptom Management: Take medications as prescribed   Attend all scheduled provider appointments Call provider office for new concerns or questions  call the Suicide and Crisis Lifeline: 988 call the [place] National Suicide Prevention Lifeline: (260)262-8456 or TTY: (434) 502-0423 TTY 4313560618) to talk to a trained counselor call 1-800-273-TALK (toll free, 24 hour hotline) if experiencing a Mental Health or Behavioral Health Crisis   Follow Up Plan: Telephone follow up appointment with care management team member scheduled for: 11-16-2022 at 130 pm       RNCM Care Management Expected Outcome:  Monitor, Self-Manage, and Reduce Symptoms of Hypertension       Current Barriers:  Knowledge Deficits related to the importance for following a heart healthy/ADA diet and the need to restrict  sodium in dietary habits  Chronic Disease Management support and education needs related to effective management of HTN  BP Readings from Last 3 Encounters:  10/11/22 128/73  10/08/22 (!) 160/78  10/01/22 102/65     Planned Interventions: Evaluation of current treatment plan related to hypertension self management and patient's adherence to plan as established by provider. The patients blood pressures are more stable now. The patient is trying to do better with his health and watching what he does. The patient is doing well since hospitalization. The patient will follow up with the pcp  on 10-31-2022. Review of keeping the area clean and dry and monitoring for sx and sx of infection. The patients wife states that she is changing the dressing and monitoring. The patients wife wanted to put antibiotic ointment on it. Education provided on consulting the provider before putting antibiotic ointment on the area.  Provided education to patient re: stroke prevention, s/s of heart attack and stroke; Reviewed prescribed diet heart healthy/ADA diet. The patient is more mindful of what he is eating and watching what he eats. He does like salt and sometimes uses too much salt. This is an ongoing obstacle but his wife states she is being diligent in trying to get him to understand the importance of not eating salt and this will help his blood pressures too.  She has removed the salt shaker from the house and the patient is upset with her. She says that it is for his own good. Education and support given.  Reviewed medications with patient and discussed importance of compliance. The patient states compliance with his medications. Works with the pharm D for effective management of medications;  Discussed plans with patient for ongoing care management follow up and provided patient with direct contact information for care management team; Advised patient, providing education and rationale, to monitor blood pressure daily and record, calling PCP for findings outside established parameters;  Reviewed scheduled/upcoming provider appointments including: 10-31-2022 at 120 pm Advised patient to discuss changes in blood pressures or heart health with provider; Provided education on prescribed diet heart healthy/ADA diet. Education and support given. Review of heart healthy/ADA choices ;  Discussed complications of poorly controlled blood pressure such as heart disease, stroke, circulatory complications, vision complications, kidney impairment, sexual dysfunction;  Screening for signs and symptoms of depression  related to chronic disease state;  Assessed social determinant of health barriers;   Symptom Management: Take medications as prescribed   Attend all scheduled provider appointments Call provider office for new concerns or questions  call the Suicide and Crisis Lifeline: 988 call the [place] National Suicide Prevention Lifeline: (831) 775-0143 or TTY: (956)698-8319 TTY (737)854-5237) to talk to a trained counselor call 1-800-273-TALK (toll free, 24 hour hotline) if experiencing a Mental Health or Behavioral Health Crisis  check blood pressure 3 times per week learn about high blood pressure keep a blood pressure log take blood pressure log to all doctor appointments call doctor for signs and symptoms of high blood pressure develop an action plan for high blood pressure keep all doctor appointments take medications for blood pressure exactly as prescribed report new symptoms to your doctor  Follow Up Plan: Telephone follow up appointment with care management team member scheduled for: 11-16-2022 at 130 pm           Our next appointment is by telephone on 11-16-2022 at 130 pm  Please call the care guide team at [phone] if you need to cancel or reschedule your appointment.  If you are experiencing a Mental Health or Behavioral Health Crisis or need someone to talk to, please call the Suicide and Crisis Lifeline: 988 call the [place] National Suicide Prevention Lifeline: 949-601-1710 or TTY: (816) 704-6059 TTY 813-066-5865) to talk to a trained counselor call 1-800-273-TALK (toll free, 24 hour hotline)   The patient verbalized understanding of instructions, educational materials, and care plan provided today and DECLINED offer to receive copy of patient instructions, educational materials, and care plan.   Telephone follow up appointment with care management team member scheduled for: 11-16-2022 at 130 pm  Alto Denver RN, MSN, CCM RN Care Manager  [facility]  Ambulatory Care  Management  Direct Number: [phone]

## 2022-10-18 NOTE — Patient Outreach (Signed)
Care Management   Visit Note  10/18/2022 Name: [name]. MRN: [id number] DOB: 23-Feb-1961  Subjective: [name]. is a 62 y.o. year old male who is a primary care patient of Cannady, Dorie Rank, NP. The Care Management team was consulted for assistance.      Engaged with patient Engaged with patient by telephone. The patients wife also was on the call with the patient, she is DPR.  Assessment:   Goals Addressed             This Visit's Progress    RNCM Care Management  Expected Outcome:  Monitor, Self-Manage and Reduce Symptoms of Diabetes       Current Barriers:  Knowledge Deficits related to the need to check blood sugars and keep blood sugars in normal range Care Coordination needs related to the importance of monitoring blood sugars, eating healthy, maintaining good control of DM in a patient with DM Chronic Disease Management support and education needs related to effective management of DM Literacy barriers  Lab Results  Component Value Date   HGBA1C 9.7 (H) 10/09/2022  Saw endocrinologist recently A1C was 8.5%   Planned Interventions: Provided education to patient about basic DM disease process. The patient needs ongoing support and education with effective management of DM. The patients wife states currently he is eating better and checking his blood sugars and writing them down. The patient has been having up and down blood sugars. The patients ate an egg sandwich this am and it went up to 300. Education on monitoring for changes and letting the pcp know.  Reviewed medications with patient and discussed importance of medication adherence. The patient is compliant with medications. The endocrinologist wants him to wear an insulin pump. The patient and the patients wife do not feel it is safe for him to wear the pump as the patient is not able to manage the pump. The patient is taking his medications as directed Reviewed prescribed diet with patient  heart healthy/ADA. The patient is doing better with ADA and options that will help with maintaining healthy blood sugars but having a hard time refraining from salt ; Counseled on importance of regular laboratory monitoring as prescribed. Review of goals of A1C and the patient is hoping his next A1C is much better;        Discussed plans with patient for ongoing care management follow up and provided patient with direct contact information for care management team;      Provided patient with written educational materials related to hypo and hyperglycemia and importance of correct treatment. The patient has had a couple falls due to his blood sugars getting down to 40 last night. The patients wife states he was concerned about his blood sugars being too high so he has not been eating like he was supposed to. Education on safety and monitoring for changes in the way he feels. Denies and lows. Highest he has seen is >300.        Reviewed scheduled/upcoming provider appointments including:10-31-2022 Advised patient, providing education and rationale, to check cbg before meals and at bedtime and when you have symptoms of low or high blood sugar and record. The endocrinologist had given the patient a continuous glucose reader but he kept knocking it off of his arm. He does not wish to keep wearing this. Education on monitoring for changes and letting the provider know for acute changes.   The patients stated blood sugar today at the time of  the call was 253.   call provider for findings outside established parameters;       Review of patient status, including review of consultants reports, relevant laboratory and other test results, and medications completed;       Advised patient to discuss changes in DM, questions, or concerns with provider;     Sees eye specialist in January. Is possibly going to have new surgery to help vision  Screening for signs and symptoms of depression related to chronic disease state;         Assessed social determinant of health barriers;         Symptom Management: Take medications as prescribed   Attend all scheduled provider appointments Call provider office for new concerns or questions  call the Suicide and Crisis Lifeline: 988 call the [place] National Suicide Prevention Lifeline: 629-182-0635 or TTY: (417)516-7180 TTY 601-442-1387) to talk to a trained counselor call 1-800-273-TALK (toll free, 24 hour hotline) if experiencing a Mental Health or Behavioral Health Crisis  keep appointment with eye doctor check feet daily for cuts, sores or redness trim toenails straight across manage portion size wash and dry feet carefully every day wear comfortable, cotton socks wear comfortable, well-fitting shoes  Follow Up Plan: Telephone follow up appointment with care management team member scheduled for: 11-16-2022 at 130 pm       RNCM Care Management  Expected Outcome:  Monitor, Self-Manage and Reduce Symptoms of: Asthma       Current Barriers:  Knowledge Deficits related to triggers that can cause exacerbation of his asthma Chronic Disease Management support and education needs related to effective management of Asthma  Planned Interventions: Provided patient with basic written and verbal Asthma education on self care/management/and exacerbation prevention. Denies any issues with his breathing. The patient states his breathing is stable. Monitoring for changes in his breathing related to allergies. Denies any new concerns at this time. Encouraged the patient not to go outside when it is hot. He wants to mow his yard. The patient was recently in the hospital for an abscess.  Advised patient to track and manage Asthma triggers. Review of the weather changes, being aware of people with infections, and monitoring for changes in his breathing;  Provided written and verbal instructions on pursed lip breathing and utilized returned demonstration as teach back; Provided  instruction about proper use of medications used for management of Asthma including inhalers; Advised patient to self assesses Asthma action plan zone and make appointment with provider if in the yellow zone for 48 hours without improvement; Advised patient to engage in light exercise as tolerated 3-5 days a week to aid in the the management of Asthma; Provided education about and advised patient to utilize infection prevention strategies to reduce risk of respiratory infection. Education and support given; Discussed the importance of adequate rest and management of fatigue with Asthma; Screening for signs and symptoms of depression related to chronic disease state;  Assessed social determinant of health barriers;    Symptom Management: Take medications as prescribed   Attend all scheduled provider appointments Call provider office for new concerns or questions  call the Suicide and Crisis Lifeline: 988 call the [place] National Suicide Prevention Lifeline: 770-784-4518 or TTY: (662)119-3145 TTY (850)718-0661) to talk to a trained counselor call 1-800-273-TALK (toll free, 24 hour hotline) if experiencing a Mental Health or Behavioral Health Crisis   Follow Up Plan: Telephone follow up appointment with care management team member scheduled for: 11-16-2022 at 130 pm  RNCM Care Management Expected Outcome:  Monitor, Self-Manage, and Reduce Symptoms of Hypertension       Current Barriers:  Knowledge Deficits related to the importance for following a heart healthy/ADA diet and the need to restrict sodium in dietary habits  Chronic Disease Management support and education needs related to effective management of HTN  BP Readings from Last 3 Encounters:  10/11/22 128/73  10/08/22 (!) 160/78  10/01/22 102/65     Planned Interventions: Evaluation of current treatment plan related to hypertension self management and patient's adherence to plan as established by provider. The patients blood  pressures are more stable now. The patient is trying to do better with his health and watching what he does. The patient is doing well since hospitalization. The patient will follow up with the pcp on 10-31-2022. Review of keeping the area clean and dry and monitoring for sx and sx of infection. The patients wife states that she is changing the dressing and monitoring. The patients wife wanted to put antibiotic ointment on it. Education provided on consulting the provider before putting antibiotic ointment on the area.  Provided education to patient re: stroke prevention, s/s of heart attack and stroke; Reviewed prescribed diet heart healthy/ADA diet. The patient is more mindful of what he is eating and watching what he eats. He does like salt and sometimes uses too much salt. This is an ongoing obstacle but his wife states she is being diligent in trying to get him to understand the importance of not eating salt and this will help his blood pressures too.  She has removed the salt shaker from the house and the patient is upset with her. She says that it is for his own good. Education and support given.  Reviewed medications with patient and discussed importance of compliance. The patient states compliance with his medications. Works with the pharm D for effective management of medications;  Discussed plans with patient for ongoing care management follow up and provided patient with direct contact information for care management team; Advised patient, providing education and rationale, to monitor blood pressure daily and record, calling PCP for findings outside established parameters;  Reviewed scheduled/upcoming provider appointments including: 10-31-2022 at 120 pm Advised patient to discuss changes in blood pressures or heart health with provider; Provided education on prescribed diet heart healthy/ADA diet. Education and support given. Review of heart healthy/ADA choices ;  Discussed complications of  poorly controlled blood pressure such as heart disease, stroke, circulatory complications, vision complications, kidney impairment, sexual dysfunction;  Screening for signs and symptoms of depression related to chronic disease state;  Assessed social determinant of health barriers;   Symptom Management: Take medications as prescribed   Attend all scheduled provider appointments Call provider office for new concerns or questions  call the Suicide and Crisis Lifeline: 988 call the [place] National Suicide Prevention Lifeline: (480)725-3808 or TTY: 570-513-2784 TTY 225-851-1909) to talk to a trained counselor call 1-800-273-TALK (toll free, 24 hour hotline) if experiencing a Mental Health or Behavioral Health Crisis  check blood pressure 3 times per week learn about high blood pressure keep a blood pressure log take blood pressure log to all doctor appointments call doctor for signs and symptoms of high blood pressure develop an action plan for high blood pressure keep all doctor appointments take medications for blood pressure exactly as prescribed report new symptoms to your doctor  Follow Up Plan: Telephone follow up appointment with care management team member scheduled for: 11-16-2022 at 130 pm  Consent to Services:  Patient was given information about care management services, agreed to services, and gave verbal consent to participate.   Plan: Telephone follow up appointment with care management team member scheduled for: 11-16-2022 at 130 pm  Alto Denver RN, MSN, CCM RN Care Manager  [facility]  Ambulatory Care Management  Direct Number: [phone]

## 2022-10-18 NOTE — Patient Outreach (Signed)
   Care Management RN Visit Note   10-18-2022 Name: [name]. MRN: [id number]      DOB: Apr 24, 1960  Subjective: [name]. is a 62 y.o. year old male who is a primary care patient of  Aura Dials, NP. The patient was referred to the Care Management team for assistance with care management needs subsequent to provider initiation of Care Management services and plan of care.      An unsuccessful telephone outreach was attempted today to contact the patient about  Care Management needs.    Plan:A HIPAA compliant phone message was left for the patient providing contact information and requesting a return call.  Alto Denver RN, MSN, CCM RN Care Manager  [facility]  Ambulatory Care Management  Direct Number: [phone]

## 2022-10-23 IMAGING — CT CT ABD-PELV W/O CM
2 of 4 series · 16 of 46 positions shown, 18 images · non-contrast
Comparison: 05/03/2017

CLINICAL DATA: Urinary tract infection without hematuria.
Proteinuria.



[Series 2: routine abd/pel wo · axial · 0.83mm/px · z∈[-945,-525]mm · 13 of 94 slices shown, 15 images]
[im 5/94  soft-tissue]
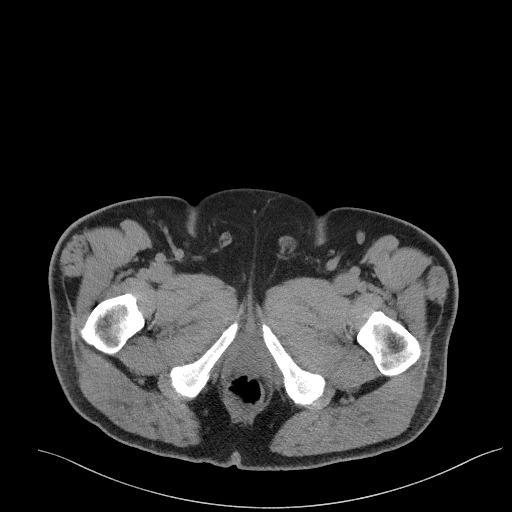
[im 5/94  bone]
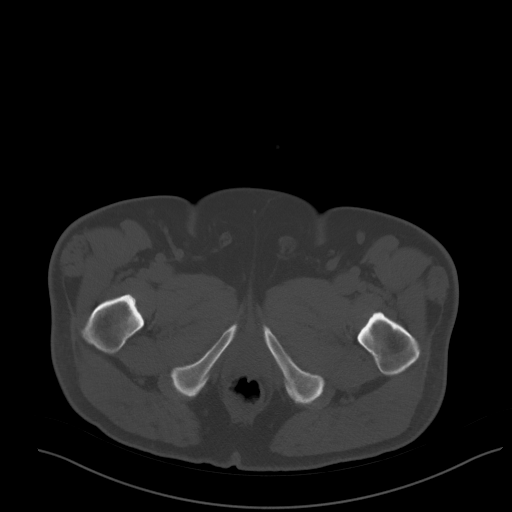
[im 13/94  soft-tissue]
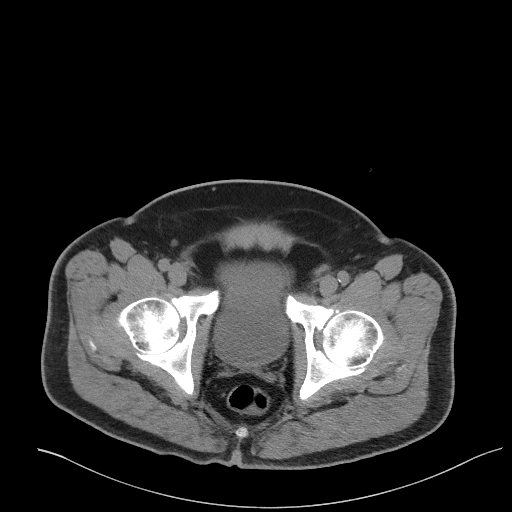
[im 21/94  soft-tissue]
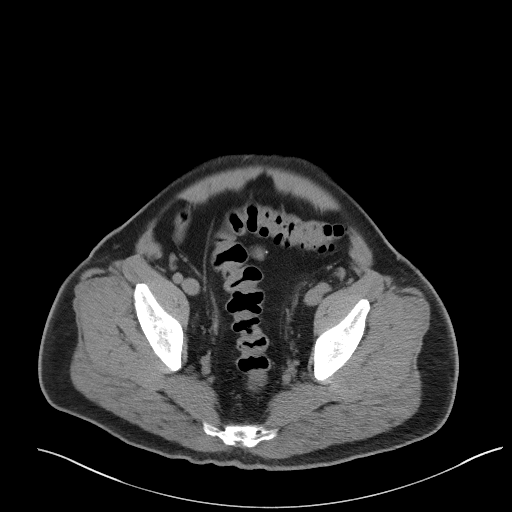
[im 25/94  soft-tissue]
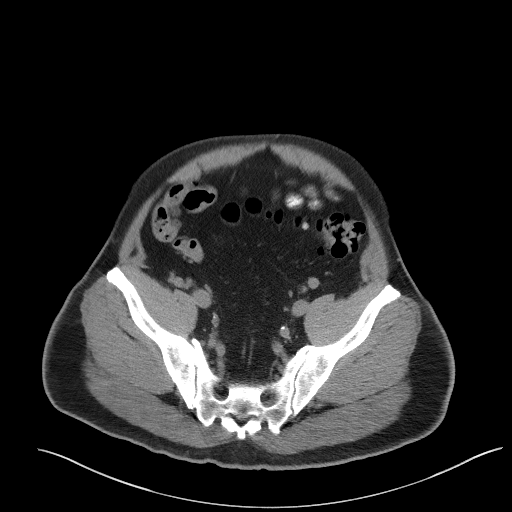
[im 33/94  soft-tissue]
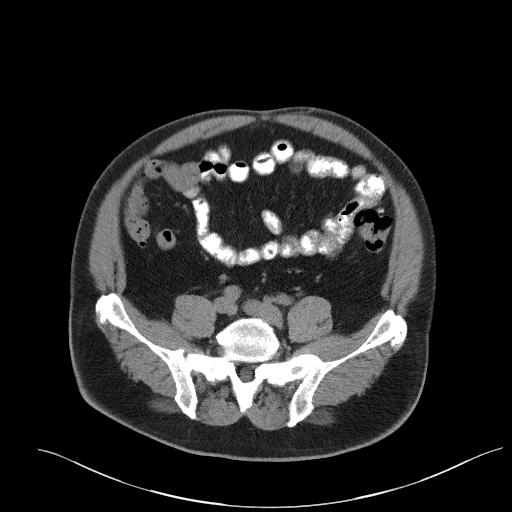
[im 41/94  soft-tissue]
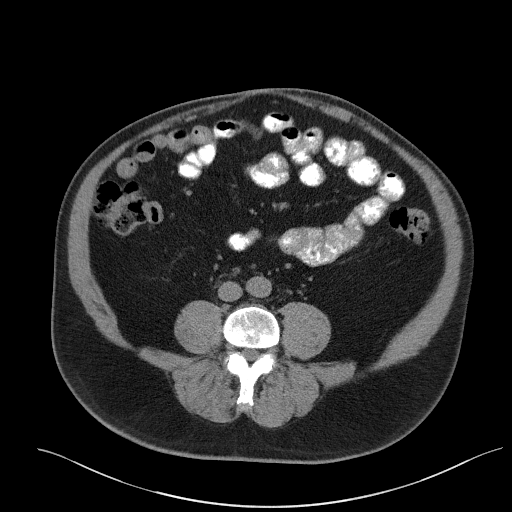
[im 49/94  soft-tissue]
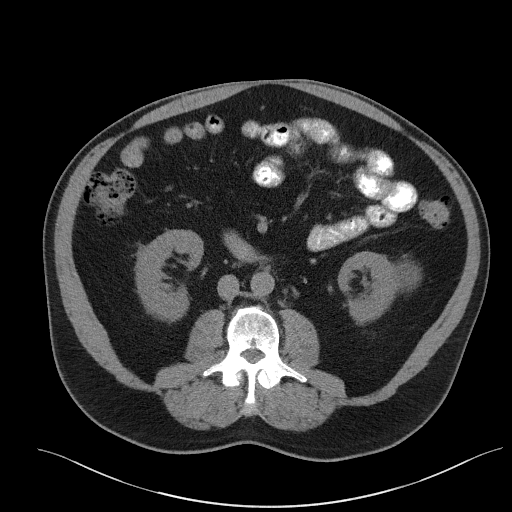
[im 53/94  soft-tissue]
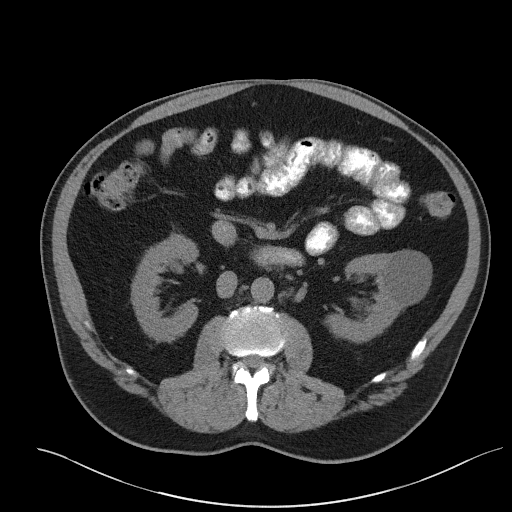
[im 61/94  soft-tissue]
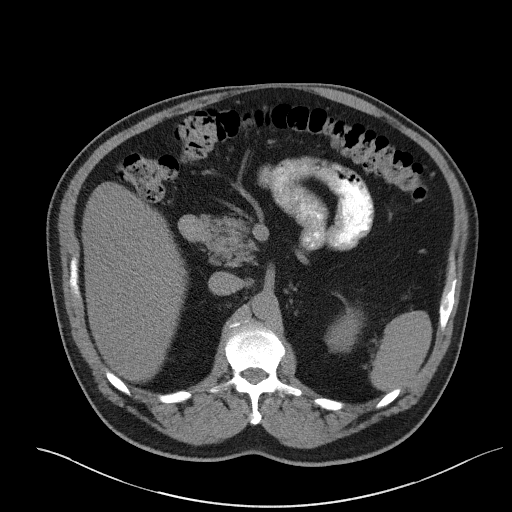
[im 61/94  bone]
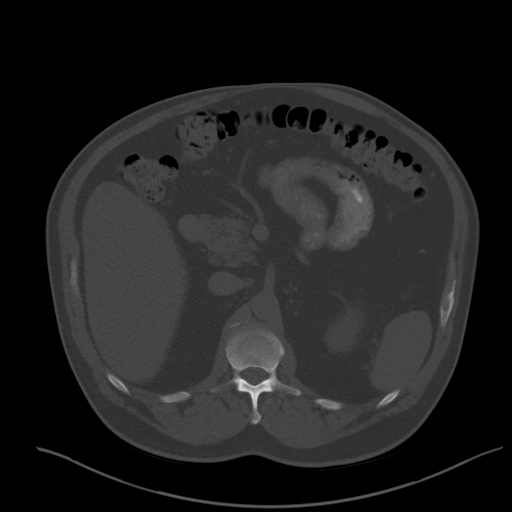
[im 69/94  soft-tissue]
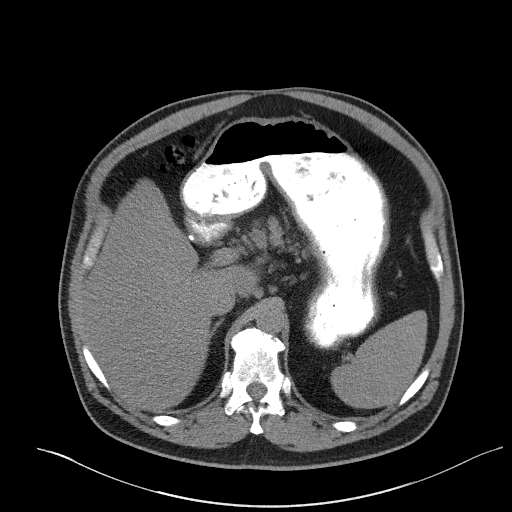
[im 73/94  soft-tissue]
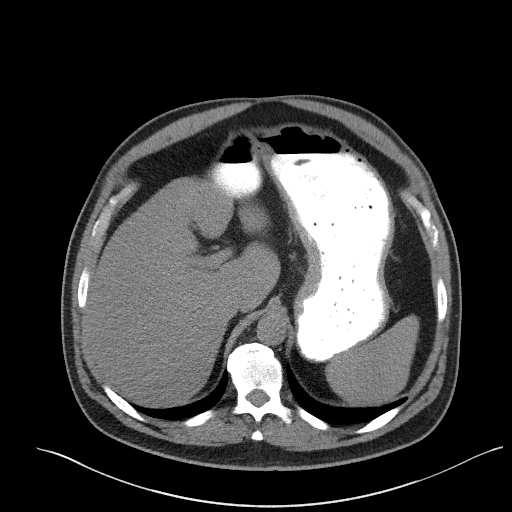
[im 81/94  soft-tissue]
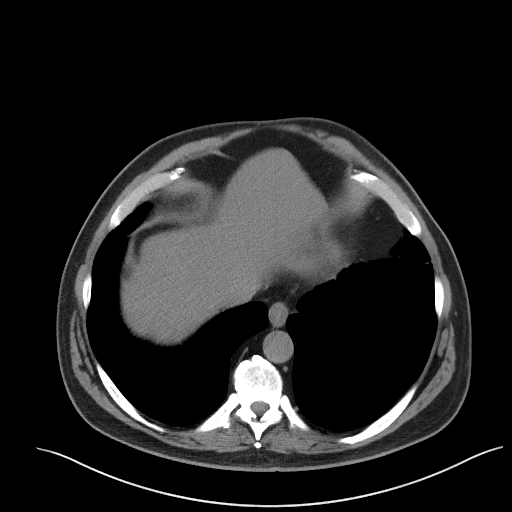
[im 89/94  soft-tissue]
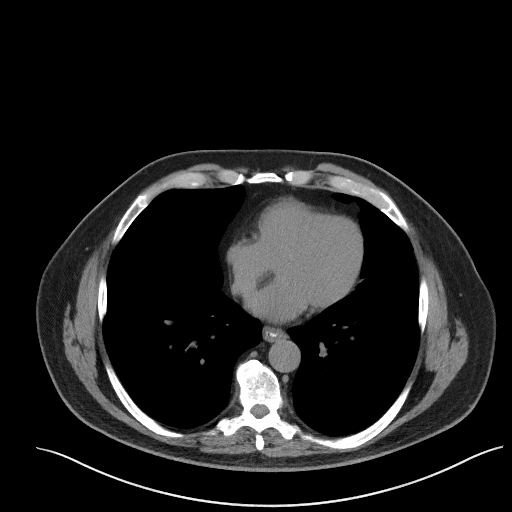

[Series 5: coronal st · coronal · 0.78mm/px · 3 of 111 slices shown]
[im 37/111  soft-tissue]
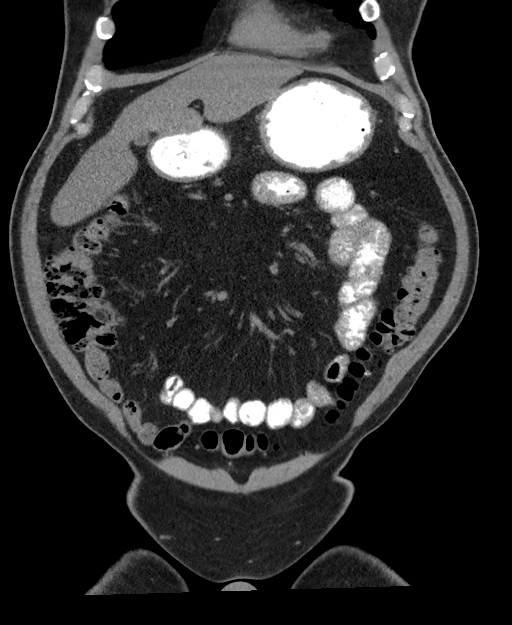
[im 49/111  soft-tissue]
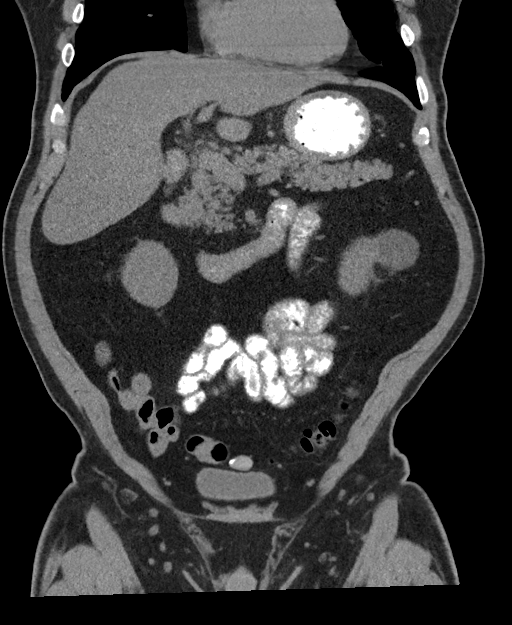
[im 62/111  soft-tissue]
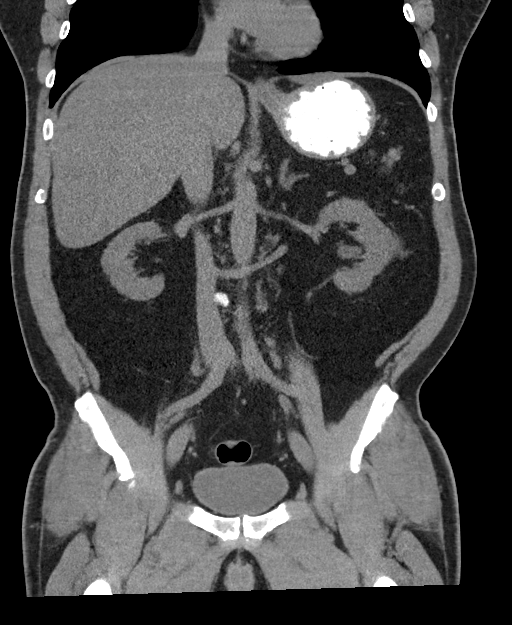

[16 of 46 positions shown; findings below may reference images not displayed]

FINDINGS: Lower chest: No acute abnormality

Hepatobiliary: Small gallstone within the gallbladder which is
contracted. No focal hepatic abnormality.

Pancreas: No focal abnormality or ductal dilatation.

Spleen: No focal abnormality.  Normal size.

Adrenals/Urinary Tract: 4 cm low-density lesion in the midpole of
the left kidney is stable since prior study compatible with cyst. No
hydronephrosis. Adrenal glands and urinary bladder unremarkable.

Stomach/Bowel: Stomach, large and small bowel grossly unremarkable.

Vascular/Lymphatic: No evidence of aneurysm or adenopathy.

Reproductive: No visible focal abnormality.

Other: No free fluid or free air.

Musculoskeletal: No acute bony abnormality.
IMPRESSION: No renal or ureteral stones.  No hydronephrosis.

Stable 4 cm left renal cyst.

Gallbladder contracted.  Small gallstone.

No acute findings.

## 2022-10-24 ENCOUNTER — Other Ambulatory Visit: Payer: Medicare Other

## 2022-10-24 NOTE — Progress Notes (Signed)
10/24/2022 Name: [name]. MRN: [id number] DOB: 08-05-1960  Chief Complaint  Patient presents with   Medication Management   [name]. is a 62 y.o. year old male who presented for a telephone visit.   They were referred to the pharmacist by their PCP for assistance in managing diabetes, hypertension, hyperlipidemia, and medication access.   Subjective:  Care Team: Primary Care Provider: Marjie Skiff, NP ; Next Scheduled Visit: 8/14 Endocrinologist 8/12  Medication Access/Adherence  Current Pharmacy:  [facility] - [place], [place] - [address] [place] [address] Phone: [phone] Fax: [phone]  [facility] - [facility] [address] [place] [address] Phone: [phone] Fax: [phone]  -Patient reports affordability concerns with their medications: Yes -Sometimes have to borrow money from family to afford copays, has reached charge limit at [facility] but owner working with them on payment plan -Patient reports access/transportation concerns to their pharmacy: No  -Patient reports adherence concerns with their medications:  Yes  Patient has all prescribed medications on hand currently except for Albuterol rescue inhaler  Diabetes: Current medications:   Jardiance 25mg  daily, Januvia 100mg  daily, Tresiba 45 units BID, Humalog 42 units TID before meals -Medications tried in the past: could not tolerate metformin -Current glucose readings: FBG 173, 160 at lunchtime, 180 at supper -Using OneTOuch Ultra meter; testing 3 times daily -Patient reports hypoglycemic s/sx including dizziness, shakiness, sweating.  This occurred once, and patient's BG was 40- wife gave him peaches in juice to raise to normal level.  States he always eats after meal-time insulin. -Patient denies hyperglycemic symptoms including polyuria, polydipsia, polyphagia, nocturia, neuropathy, blurred  vision. -Discussed CGM and benefit in alerting when BG trending low (or high).  Patient/wife state he has tried before, and it did not work well for him.  Prefers to continue daily fingersticks. -Patient would like more information on an insulin pump and is seeing endocrinology next week.  Wife is currently giving insulin injections, so there is concern with administration if she is ever not around -Dietary restrictions due to affordability and what they receive from food pantry program  Hypertension: Current medications: lisinopril 20mg  daily -Patient has an automated home BP cuff but is unsure of accuracy -Current blood pressure readings readings: 155/74 -Patient denies hypotensive s/sx including dizziness, lightheadedness.  -Patient denies hypertensive symptoms including headache, chest pain, shortness of breath  Hyperlipidemia/ASCVD Risk Reduction Current lipid lowering medications: rosuvastatin 40mg  daily, Vascepa 1g BID, fenofibrate 54mg  daily -Medications tried in the past: atorvastatin 80mg  daily Antiplatelet regimen: ASA 81mg  daily, clopidogrel 75mg  daily -Patient has been taking atorvastatin 40mg  as well as atorvastatin 80mg  daily  Objective: Lab Results  Component Value Date   HGBA1C 9.7 (H) 10/09/2022   Lab Results  Component Value Date   CREATININE 0.96 10/11/2022   BUN 17 10/11/2022   NA 136 10/11/2022   K 4.0 10/11/2022   CL 107 10/11/2022   CO2 22 10/11/2022   Lab Results  Component Value Date   CHOL 121 07/13/2022   HDL 36 (L) 07/13/2022   LDLCALC 61 07/13/2022   TRIG 136 07/13/2022   CHOLHDL 5.3 03/03/2019   Medications Reviewed Today     Reviewed by Lenna Gilford, RPH (Pharmacist) on 10/24/22 at 1130  Med List Status: <None>   Medication Order Taking? Sig Documenting Provider Last Dose Status Informant  albuterol (VENTOLIN HFA) 108 (90 Base) MCG/ACT inhaler [id number] No Inhale 2 puffs  into the lungs every 6 (six) hours as needed.  Patient not  taking: Reported on 10/09/2022   Aura Dials T, NP Not Taking Active Spouse/Significant Other  aspirin 81 MG EC tablet [id number] Yes Take 1 tablet (81 mg total) by mouth daily. Aura Dials T, NP Taking Active Spouse/Significant Other  Blood Glucose Monitoring Suppl (ONE TOUCH ULTRA 2) w/Device KIT [id number] Yes Use to check blood sugar 4 times a day Cannady, Jolene T, NP Taking Active   clopidogrel (PLAVIX) 75 MG tablet [id number] Yes Take 1 tablet (75 mg total) by mouth daily. Aura Dials T, NP Taking Active Spouse/Significant Other  empagliflozin (JARDIANCE) 25 MG TABS tablet [id number] Yes Take 1 tablet (25 mg total) by mouth daily. Aura Dials T, NP Taking Active Spouse/Significant Other  EPINEPHRINE 0.3 mg/0.3 mL IJ SOAJ injection [id number] Yes INJECT 1 SYRINGE INTO OUTER THIGH ONCE AS NEEDED FOR SEVERE ALLERGIC REACTION.  Patient taking differently: Inject 0.3 mg into the muscle as needed.   Aura Dials T, NP Taking Active Spouse/Significant Other  fenofibrate 54 MG tablet [id number] Yes Take 1 tablet (54 mg total) by mouth daily. Aura Dials T, NP Taking Active Spouse/Significant Other  fluticasone furoate-vilanterol (BREO ELLIPTA) 100-25 MCG/ACT AEPB [id number] Yes Inhale 1 puff into the lungs daily. Aura Dials T, NP Taking Active Spouse/Significant Other  gabapentin (NEURONTIN) 300 MG capsule [id number] Yes Take 1 capsule (300 mg total) by mouth 2 (two) times daily. Aura Dials T, NP Taking Active Spouse/Significant Other  glucose blood (ONETOUCH ULTRA) test strip [id number] Yes TEST BLOOD SUGAR 5 TIMES A DAY Cannady, Jolene T, NP Taking Active   HYDROcodone-acetaminophen (NORCO/VICODIN) 5-325 MG tablet [id number] No Take 1 tablet by mouth every 6 (six) hours as needed for moderate pain. [provider] Unknown Active Spouse/Significant Other  icosapent Ethyl (VASCEPA) 1 g capsule [id number] Yes Take 1 capsule (1 g total) by mouth 2 (two) times daily. Aura Dials T, NP Taking Active Spouse/Significant Other  Insulin Degludec (TRESIBA) 100 UNIT/ML SOLN [id number] Yes Inject 40 Units into the skin 2 (two) times daily.  Patient taking differently: Inject 45 Units into the skin 2 (two) times daily.   Aura Dials T, NP Taking Active Spouse/Significant Other  Insulin Lispro Junior KwikPen (HUMALOG JR) 100 UNIT/ML KwikPen [id number] Yes Inject 42 Units into the skin 3 (three) times daily before meals. [provider] Taking Active Spouse/Significant Other  Insulin Pen Needle (PEN NEEDLES) 32G X 4 MM MISC [id number] Yes 1 kit by Does not apply route daily.  Patient taking differently: 1 kit by Does not apply route daily. 4x daily   Aura Dials T, NP Taking Active   lisinopril (ZESTRIL) 20 MG tablet [id number] Yes Take 1 tablet (20 mg total) by mouth daily. Marjie Skiff, NP Taking Active Spouse/Significant Other  Misc. Devices (PULSE OXIMETER FOR FINGER) MISC [id number] No To check O2 saturations once daily with asthma and document + check if any shortness of breath Cannady, Jolene T, NP Unknown Active   montelukast (SINGULAIR) 10 MG tablet [id number] Yes Take 1 tablet (10 mg total) by mouth at bedtime. Aura Dials T, NP Taking Active Spouse/Significant Other  mupirocin ointment (BACTROBAN) 2 % [id number] No Apply 1 Application topically 2 (two) times daily. Aura Dials T, NP Unknown Active Spouse/Significant Other  nitroGLYCERIN (NITROSTAT) 0.4 MG SL tablet [id number] Yes Place 1 tablet (0.4 mg total) under the tongue every 5 (five) minutes as needed for chest pain. Marjie Skiff, NP Taking Active Spouse/Significant Other  omeprazole (PRILOSEC) 20 MG capsule [id number] Yes Take 1 capsule (20 mg total) by mouth daily. Marjie Skiff, NP Taking Active Spouse/Significant Other  OneTouch Delica Lancets 33G MISC [id number] Yes USE AS DIRECTED Aura Dials T, NP Taking Active   rosuvastatin (CRESTOR) 40 MG tablet [id number] Yes Take 1  tablet (40 mg total) by mouth daily. Aura Dials T, NP Taking Active Spouse/Significant Other  sitaGLIPtin (JANUVIA) 100 MG tablet [id number] Yes Take 1 tablet (100 mg total) by mouth daily. Aura Dials T, NP Taking Active Spouse/Significant Other  tiZANidine (ZANAFLEX) 2 MG tablet [id number] No Take 1 tablet (2 mg total) by mouth every 6 (six) hours as needed for muscle spasms.  Patient taking differently: Take 2 mg by mouth every 6 (six) hours.   Aura Dials T, NP Unknown Active Spouse/Significant Other  triamcinolone cream (KENALOG) 0.1 % [id number] No Apply 1 Application topically 2 (two) times daily.  Patient taking differently: Apply 1 Application topically 2 (two) times daily. Arms and legs   Cannady, Jolene T, NP Unknown Active Spouse/Significant Other  valACYclovir (VALTREX) 500 MG tablet [id number] Yes Take 500 mg by mouth 3 (three) times daily. [provider] Taking Active Spouse/Significant Other  Vitamin D, Ergocalciferol, (DRISDOL) 1.25 MG (50000 UNIT) CAPS capsule [id number] Yes Take 50,000 Units by mouth once a week. [provider] Taking Active Spouse/Significant Other           Med Note Marchelle Folks   Tue Oct 09, 2022 12:11 AM) Leanora Ivanoff on Mondays.            Assessment/Plan:   Medication Access/Adherence -Discussed LIS through Medicare ExtraHelp and patient assistance programs for brand medications such as Jardiance, Januvia, Tresiba, Humalog, [place], and [place].  It is likely based on our preliminary discussion, that the patient qualifies for Medicare Extra help; this would assist with all prescription copays and some other medical costs.  Appointment scheduled for 8/20 for a telephone visit where I will complete LIS application online with them. -They endorse ability to afford refills until determination of LIS is known.  If denied, we will pursue patient assistance programs for qualifying medications.  Diabetes: - Currently uncontrolled -  Will default care to endocrinology at this time since patient has a scheduled appointment 8/12.  Continue current regimen until then. - Strongly recommend CGM discussion again in the future - Patient/wife could benefit from nutrition education if PCP interested in placing a referral; but they are limited based on affordability of certain foods  Hypertension: - Currently uncontrolled - Educated patient/wife to bring BP monitor to upcoming appointment with Jolene to check accuracy - If BP remains elevated, I recommend increasing lisinopril 20mg  to 40mg  daily (can use 2 tablets of 20mg  on hand to not have to waste before filling 40mg )  Hyperlipidemia/ASCVD Risk Reduction: - Currently controlled.  - Educate patient to STOP atorvastatin 80mg  daily but continue the rest of his current HLD regimen - Recommend follow-up lipid panel in 3 months to see if rosuvastatin dose may need to be increased since previous values potentially reflecting treatment with both statins on board   Follow Up Plan: Telephone visit 8/20 to complete LIS application  Lenna Gilford, PharmD, DPLA

## 2022-10-27 NOTE — Patient Instructions (Incomplete)
Skin Abscess  A skin abscess is an infected spot of skin. It can have pus in it. An abscess can happen in any part of your body. Some abscesses break open (rupture) on their own. Most keep getting worse unless they are treated. If your abscess is not treated, the infection can spread deeper into your body and blood. This can make you feel sick. What are the causes? Germs that enter your skin. This may happen if you have: A cut or scrape. A wound from a needle or an insect bite. Blocked oil or sweat glands. A problem with the spot where your hair goes into your skin. A fluid-filled sac called a cyst under your skin. What increases the risk? Having problems with how your blood moves through your body. Having a weak body defense system (immune system). Having diabetes. Having dry and irritated skin. Needing to get shots often. Putting drugs into your body with a needle. Having a splinter or something else in your skin. Smoking. What are the signs or symptoms? A firm bump under your skin that hurts. A bump with pus at the top. Redness and swelling. Warm or tender spots. A sore on the skin. How is this treated? You may need to: Put a heat pack or a warm, wet washcloth on the spot. Have the pus drained. Take antibiotics. Follow these instructions at home: Medicines Take over-the-counter and prescription medicines only as told by your doctor. If you were prescribed antibiotics, take them as told by your doctor. Do not stop taking them even if you start to feel better. Abscess care  If you have an abscess that has not drained, put heat on it. Use the heat source that your doctor recommends, such as a moist heat pack or a heating pad. Place a towel between your skin and the heat source. Leave the heat on for 20-30 minutes. If your skin turns bright red, take off the heat right away to prevent burns. The risk of burns is higher if you cannot feel pain, heat, or cold. Follow  instructions from your doctor about how to take care of your abscess. Make sure you: Cover the abscess with a bandage. Wash your hands with soap and water for at least 20 seconds before and after you change your bandage. If you cannot use soap and water, use hand sanitizer. Change your bandage as told by your doctor. Check your abscess every day for signs that the infection is getting worse. Check for: More redness, swelling, or pain. More fluid or blood. Warmth. More pus or a worse smell. General instructions To keep the infection from spreading: Do not share personal items or towels. Do not go in a hot tub with others. Avoid making skin contact with others. Be careful when you get rid of used bandages or any pus from the abscess. Do not smoke or use any products that contain nicotine or tobacco. If you need help quitting, ask your doctor. Contact a doctor if: You see red streaks on your skin near the abscess. You have any signs of worse infection. You vomit every time you eat or drink. You have a fever, chills, or muscle aches. The cyst or abscess comes back. Get help right away if: You have very bad pain. You make less pee (urine) than normal. This information is not intended to replace advice given to you by your health care provider. Make sure you discuss any questions you have with your health care provider. Document Revised: 10/18/2021  Document Reviewed: 10/18/2021 Elsevier Patient Education  2024 [employer].

## 2022-10-29 DIAGNOSIS — E1159 Type 2 diabetes mellitus with other circulatory complications: Secondary | ICD-10-CM | POA: Diagnosis not present

## 2022-10-29 DIAGNOSIS — I152 Hypertension secondary to endocrine disorders: Secondary | ICD-10-CM | POA: Diagnosis not present

## 2022-10-29 DIAGNOSIS — E1129 Type 2 diabetes mellitus with other diabetic kidney complication: Secondary | ICD-10-CM | POA: Diagnosis not present

## 2022-10-29 DIAGNOSIS — R809 Proteinuria, unspecified: Secondary | ICD-10-CM | POA: Diagnosis not present

## 2022-10-29 DIAGNOSIS — E701 Other hyperphenylalaninemias: Secondary | ICD-10-CM | POA: Diagnosis not present

## 2022-10-29 DIAGNOSIS — E1169 Type 2 diabetes mellitus with other specified complication: Secondary | ICD-10-CM | POA: Diagnosis not present

## 2022-10-29 DIAGNOSIS — E785 Hyperlipidemia, unspecified: Secondary | ICD-10-CM | POA: Diagnosis not present

## 2022-10-29 DIAGNOSIS — Z794 Long term (current) use of insulin: Secondary | ICD-10-CM | POA: Diagnosis not present

## 2022-10-29 DIAGNOSIS — E1165 Type 2 diabetes mellitus with hyperglycemia: Secondary | ICD-10-CM | POA: Diagnosis not present

## 2022-10-31 ENCOUNTER — Ambulatory Visit (INDEPENDENT_AMBULATORY_CARE_PROVIDER_SITE_OTHER): Payer: Medicare Other | Admitting: Nurse Practitioner

## 2022-10-31 ENCOUNTER — Encounter: Payer: Self-pay | Admitting: Nurse Practitioner

## 2022-10-31 ENCOUNTER — Encounter (INDEPENDENT_AMBULATORY_CARE_PROVIDER_SITE_OTHER): Payer: Medicare Other

## 2022-10-31 VITALS — BP 116/57 | HR 59 | Temp 97.7°F | Wt 173.8 lb

## 2022-10-31 DIAGNOSIS — R809 Proteinuria, unspecified: Secondary | ICD-10-CM

## 2022-10-31 DIAGNOSIS — L0291 Cutaneous abscess, unspecified: Secondary | ICD-10-CM

## 2022-10-31 DIAGNOSIS — W57XXXA Bitten or stung by nonvenomous insect and other nonvenomous arthropods, initial encounter: Secondary | ICD-10-CM

## 2022-10-31 DIAGNOSIS — Z794 Long term (current) use of insulin: Secondary | ICD-10-CM

## 2022-10-31 DIAGNOSIS — R296 Repeated falls: Secondary | ICD-10-CM | POA: Diagnosis not present

## 2022-10-31 DIAGNOSIS — E1129 Type 2 diabetes mellitus with other diabetic kidney complication: Secondary | ICD-10-CM

## 2022-10-31 DIAGNOSIS — A4102 Sepsis due to Methicillin resistant Staphylococcus aureus: Secondary | ICD-10-CM

## 2022-10-31 NOTE — Assessment & Plan Note (Signed)
Chronic, ongoing.  A1c 9.7% in July, slight trend up from 10.5%.  He had initial visit with [facility] endo yesterday and scheduled to return on 01/15/23 -- he is aware not to miss this visit.  Urine ALB 150 January 2024, continue Lisinopril. - Continue Jardiance, Januvia, + insulin as ordered by previous endo.   - Poor tolerance to Metformin in past with AKI presenting x 2 trials and allergic to Sulfa. Would avoid GLP at this time due to patient history of pancreatitis x 3, concern this would flare. Educated patient at length on effect of diabetes from head to toe and increased risk for recurrent CVA due to poor control.   - Recommend they check his BS TID -- he would benefit from [facility] will work on this with CCM team in future.  Has been working with pharmacist, SW, and nurse case manager. - Goal A1C <6.5% due to CVA history -- which reiterated at length with patient today.  Discussed at length what an A1c at his current level could cause. - Foot and eye exam up to date. - Vaccinations up to date

## 2022-10-31 NOTE — Assessment & Plan Note (Signed)
Acute and improved.  Will recheck CBC and CMP today.  Overall area of abscess is fully healed and baseline vital signs.  No red flags.

## 2022-10-31 NOTE — Assessment & Plan Note (Signed)
Noted to bilateral ankles and healed.  He does endorse bed bugs at home.  Educated him on what to treat with -- ensure all linen and clothing is fully washed and obtain bed bug spray to use on all mattresses, couches, and rugs in home.  Recommend he check all pets in house.

## 2022-10-31 NOTE — Assessment & Plan Note (Signed)
Acute and overall healed at this time.  Continue to monitor.  Suspect this came from bed bug bites, noted bites to bilateral ankles on exam today.  He does endorse bed bugs at home.  Educated him on what to treat with -- ensure all linen and clothing is fully washed and obtain bed bug spray to use on all mattresses, couches, and rugs in home.  Recommend he check all pets in house.

## 2022-10-31 NOTE — Assessment & Plan Note (Signed)
Ongoing issue at baseline, is to use walker at all times, but did not bring with him today.  Recommend he consistently use for fall prevention.  Will place home health referral which would offer benefit for fall prevention and strengthening.

## 2022-10-31 NOTE — Progress Notes (Signed)
BP (!) 116/57   Pulse (!) 59   Temp 97.7 F (36.5 C) (Oral)   Wt 173 lb 12.8 oz (78.8 kg)   SpO2 93%   BMI 29.83 kg/m    Subjective:    Patient ID: [name]., male    DOB: 1960-05-08, 62 y.o.   MRN: [id number]  HPI: [name]. is a 62 y.o. male  Chief Complaint  Patient presents with   Hospitalization Follow-up   Fall    Pt states he had a fall 2 days ago, states his R elbow and R knee has been sore since.    Transition of Care Hospital Follow up.  Follow-up today for admission on 10/08/22 to [facility] due to abscess on right upper back, he also reported chest pain which has been going on the past several months which he reports having discussed with cardiology.  Is to have stress test. He is scheduled to see cardiology on the 26th.  Recent A1c in July was 9.7%, he had initial visit with endocrinology at [facility] on 10/30/22 with no changes made at this time due to patient reporting a hypoglycemia episode. He is to return in 2-3 months and call endo to alert them to what supplies he has at home.  He was sent home with Doxycycline to take.    He reports his wound has improved and not having any pain to area.  Denies fevers, SOB, or CP today.  Does endorse recent fall, he fell on ramp 2 days ago.  He reports he got a bit light headed and fell.  When fell he hit side of ramp. Reports he hit right knee and elbow, they were a little sore.  Has been using rollator.  Was not using at the time of fall.  He reports he was told in hospital to use it wherever he goes due to frequent falls, does not have today.  He reports "guess I was taking a chance".  States he is to continue to cover wound on shoulder at this time and has been doing this regularly.    "Brief/Interim Summary: HPI was taken from [name]: [name]. is a 62 y.o. 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  Per wife at bedside they did try to drain it before at home and patient has been feeling weak with pain and worsening of the swelling decided to come to the hospital today.  Patient did have I&D in the emergency room and had wound cultures collected as he met sepsis criteria.  After I&D patient started to report right-sided chest pain that has been going on for the past several months.  Patient has discussed with his cardiologist who recommended that he needs a stress test and patient is waiting for that to be scheduled.  Patient otherwise does not report any headaches blurred vision fevers or chills shortness of breath palpitations nausea vomiting diarrhea bleeding or any other complaints.   In the emergency room patient meets sepsis criteria with vitals and source of infection. Blood work shows leukocytosis of 11.4 CMP shows hypokalemia of 3.3, lactic acid was 1 point 0.8 initially and has remained normal.  CMP shows normal LFTs otherwise.  Per patient his glucoses always high in the 200s and his diabetes is poorly controlled. Patient's last A1c was 10.5."  Hospital/Facility: [facility] D/C Physician: Dr. Mayford Knife D/C Date: 10/11/22  Records Requested: 10/31/22 Records Received: 10/31/22 Records Reviewed: 10/31/22  Diagnoses on Discharge: Cellulitis and abscess of trunk  Date of interactive Contact within 48 hours of discharge:  Contact was through: none noted  Date of 7 day or 14 day face-to-face visit: greater then 14 days  Outpatient Encounter Medications as of 10/31/2022  Medication Sig Note   albuterol (VENTOLIN HFA) 108 (90 Base) MCG/ACT inhaler Inhale 2 puffs into the lungs every 6 (six) hours as needed.    aspirin 81 MG EC tablet Take 1 tablet (81 mg  total) by mouth daily.    Blood Glucose Monitoring Suppl (ONE TOUCH ULTRA 2) w/Device KIT Use to check blood sugar 4 times a day    clopidogrel (PLAVIX) 75 MG tablet Take 1 tablet (75 mg total) by mouth daily.    empagliflozin (JARDIANCE) 25 MG TABS tablet Take 1 tablet (25 mg total) by mouth daily.    EPINEPHRINE 0.3 mg/0.3 mL IJ SOAJ injection INJECT 1 SYRINGE INTO OUTER THIGH ONCE AS NEEDED FOR SEVERE ALLERGIC REACTION. (Patient taking differently: Inject 0.3 mg into the muscle as needed.)    fenofibrate 54 MG tablet Take 1 tablet (54 mg total) by mouth daily.    fluticasone furoate-vilanterol (BREO ELLIPTA) 100-25 MCG/ACT AEPB Inhale 1 puff into the lungs daily.    gabapentin (NEURONTIN) 300 MG capsule Take 1 capsule (300 mg total) by mouth 2 (two) times daily.    glucose blood (ONETOUCH ULTRA) test strip TEST BLOOD SUGAR 5 TIMES A DAY    HYDROcodone-acetaminophen (NORCO/VICODIN) 5-325 MG tablet Take 1 tablet by mouth every 6 (six) hours as needed for moderate pain.    icosapent Ethyl (VASCEPA) 1 g capsule Take 1 capsule (1 g total) by mouth 2 (two) times daily.    Insulin Degludec (TRESIBA) 100 UNIT/ML SOLN Inject 40 Units into the skin 2 (two) times daily. (Patient taking differently: Inject 45 Units into the skin 2 (two) times daily.)    Insulin Lispro Junior KwikPen (HUMALOG JR) 100 UNIT/ML KwikPen Inject 42 Units into the skin 3 (three) times daily before meals.    Insulin Pen Needle (PEN NEEDLES) 32G X 4 MM MISC 1 kit by Does not apply route daily. (Patient taking differently: 1 kit by Does not apply route daily. 4x daily)    lisinopril (ZESTRIL) 20 MG tablet Take 1 tablet (20 mg total) by mouth daily.    Misc. Devices (PULSE OXIMETER FOR FINGER) MISC To check O2 saturations once daily with asthma and document + check if any shortness of breath    montelukast (SINGULAIR) 10 MG tablet Take 1 tablet (10 mg total) by mouth at bedtime.    mupirocin ointment (BACTROBAN) 2 % Apply 1 Application  topically 2 (two) times daily.    nitroGLYCERIN (NITROSTAT) 0.4 MG SL tablet Place 1 tablet (0.4 mg total) under the tongue every 5 (five) minutes as needed for chest pain.    omeprazole (PRILOSEC) 20 MG capsule Take 1 capsule (20 mg total) by mouth daily.    OneTouch Delica Lancets 33G MISC USE AS DIRECTED    rosuvastatin (CRESTOR) 40 MG tablet Take 1 tablet (40 mg total) by mouth daily.    sitaGLIPtin (JANUVIA) 100 MG tablet Take 1 tablet (100 mg total) by mouth daily.  tiZANidine (ZANAFLEX) 2 MG tablet Take 1 tablet (2 mg total) by mouth every 6 (six) hours as needed for muscle spasms. (Patient taking differently: Take 2 mg by mouth every 6 (six) hours.)    triamcinolone cream (KENALOG) 0.1 % Apply 1 Application topically 2 (two) times daily. (Patient taking differently: Apply 1 Application topically 2 (two) times daily. Arms and legs)    valACYclovir (VALTREX) 500 MG tablet Take 500 mg by mouth 3 (three) times daily.    Vitamin D, Ergocalciferol, (DRISDOL) 1.25 MG (50000 UNIT) CAPS capsule Take 50,000 Units by mouth once a week. 10/09/2022: Takes on Mondays.   No facility-administered encounter medications on file as of 10/31/2022.   Diagnostic Tests Reviewed/Disposition:     Latest Ref Rng & Units 10/11/2022    3:32 AM 10/10/2022    3:35 AM 10/09/2022    5:46 AM  CBC  WBC 4.0 - 10.5 K/uL 9.3  13.3  13.2   Hemoglobin 13.0 - 17.0 g/dL 91.4  78.2  95.6   Hematocrit 39.0 - 52.0 % 38.3  40.1  39.8   Platelets 150 - 400 K/uL 204  202  194        Latest Ref Rng & Units 10/11/2022    3:32 AM 10/10/2022    3:35 AM 10/09/2022    5:46 AM  CMP  Glucose 70 - 99 mg/dL 213  086  578   BUN 6 - 20 mg/dL 17  14  11    Creatinine 0.61 - 1.24 mg/dL 4.69  6.29  5.28   Sodium 135 - 145 mmol/L 136  134  137   Potassium 3.5 - 5.1 mmol/L 4.0  3.1  3.4   Chloride 98 - 111 mmol/L 107  105  105   CO2 22 - 32 mmol/L 22  24  24    Calcium 8.9 - 10.3 mg/dL 8.2  8.2  8.6   Total Protein 6.5 - 8.1 g/dL   7.1    Total Bilirubin 0.3 - 1.2 mg/dL   1.6   Alkaline Phos 38 - 126 U/L   81   AST 15 - 41 U/L   21   ALT 0 - 44 U/L   21     Consults: none  Discharge Instructions: Follow-up with PCP and cardiology  Disease/illness Education: Have reviewed at length with patient and provided education   Home Health/Community Services Discussions/Referrals: none, refuses  Establishment or re-establishment of referral orders for community resources: none  Discussion with other health care providers: reviewed all recent notes  Assessment and Support of treatment regimen adherence: Have reviewed at length with patient and provided education   Appointments Coordinated with: Have reviewed at length with patient   Education for self-management, independent living, and ADLs:  Have reviewed at length with patient and provided education   Relevant past medical, surgical, family and social history reviewed and updated as indicated. Interim medical history since our last visit reviewed. Allergies and medications reviewed and updated.  Review of Systems  Constitutional:  Negative for activity change, diaphoresis, fatigue and fever.  Respiratory:  Negative for cough, chest tightness, shortness of breath and wheezing.   Cardiovascular:  Negative for chest pain, palpitations and leg swelling.  Gastrointestinal: Negative.   Musculoskeletal:  Positive for arthralgias.  Neurological:  Negative for weakness.  Psychiatric/Behavioral: Negative.      Per HPI unless specifically indicated above     Objective:    BP (!) 116/57   Pulse (!) 59   Temp  97.7 F (36.5 C) (Oral)   Wt 173 lb 12.8 oz (78.8 kg)   SpO2 93%   BMI 29.83 kg/m   Wt Readings from Last 3 Encounters:  10/31/22 173 lb 12.8 oz (78.8 kg)  10/10/22 171 lb 11.8 oz (77.9 kg)  10/08/22 170 lb 6.4 oz (77.3 kg)    Physical Exam Vitals and nursing note reviewed.  Constitutional:      General: He is awake. He is not in acute distress.     Appearance: He is well-developed and well-groomed. He is obese. He is not ill-appearing or toxic-appearing.  HENT:     Head: Normocephalic and atraumatic.     Right Ear: Hearing and external ear normal. No drainage.     Left Ear: Hearing and external ear normal. No drainage.  Eyes:     General: Lids are normal.        Right eye: No discharge.        Left eye: No discharge.     Extraocular Movements: Extraocular movements intact.     Conjunctiva/sclera: Conjunctivae normal.     Pupils: Pupils are equal, round, and reactive to light.  Neck:     Thyroid: No thyromegaly.     Vascular: No carotid bruit.  Cardiovascular:     Rate and Rhythm: Regular rhythm. Bradycardia present.     Heart sounds: Normal heart sounds, S1 normal and S2 normal.     No gallop.     Comments: Systolic murmur noted best at left sternal border, soft. Pulmonary:     Effort: Pulmonary effort is normal. No accessory muscle usage or respiratory distress.     Breath sounds: Normal breath sounds. No decreased breath sounds, wheezing or rhonchi.  Abdominal:     General: Bowel sounds are normal. There is no distension.     Palpations: Abdomen is soft.     Tenderness: There is no abdominal tenderness.  Musculoskeletal:     Right elbow: Normal.     Left elbow: Normal.     Cervical back: Full passive range of motion without pain, normal range of motion and neck supple.     Right knee: Normal.     Left knee: Normal.     Right lower leg: Edema (trace) present.     Left lower leg: Edema (trace) present.     Comments: Antalgic gait at baseline, no walker present.  Lymphadenopathy:     Cervical: No cervical adenopathy.  Skin:    General: Skin is warm and dry.     Capillary Refill: Capillary refill takes less than 2 seconds.          Comments: Healing and crusted bed bug bites noted to bilateral ankles on exam.  Neurological:     Mental Status: He is alert and oriented to person, place, and time.     Deep Tendon  Reflexes: Reflexes are normal and symmetric.     Reflex Scores:      Brachioradialis reflexes are 2+ on the right side and 2+ on the left side.      Patellar reflexes are 2+ on the right side and 2+ on the left side. Psychiatric:        Attention and Perception: Attention normal.        Mood and Affect: Mood normal.        Speech: Speech normal.        Behavior: Behavior normal. Behavior is cooperative.        Thought Content: Thought content  normal.    Results for orders placed or performed during the hospital encounter of 10/08/22  MRSA culture   Specimen: Nasal; Body Fluid  Result Value Ref Range   Specimen Description      NASAL SWAB Performed at [facility], [address] [place], [place] [address]    Special Requests      NONE Performed at [facility], [address] [place], [place] [address]    Culture      NO STAPHYLOCOCCUS AUREUS ISOLATED Performed at [facility], [address] [place], [place] [address]    Report Status 10/09/2022 FINAL   Aerobic/Anaerobic Culture w Gram Stain (surgical/deep wound)   Specimen: Back  Result Value Ref Range   Specimen Description      BACK Performed at [facility], [address] [place], [place] [address]    Special Requests      NONE Performed at [facility], [address] [place], [place] [address]    Gram Stain      RARE WBC PRESENT,BOTH PMN AND MONONUCLEAR RARE GRAM POSITIVE COCCI IN CLUSTERS    Culture      MODERATE METHICILLIN RESISTANT STAPHYLOCOCCUS AUREUS NO ANAEROBES ISOLATED Performed at [facility], [address], [place], [place] [address]    Report Status 10/14/2022 FINAL    Organism ID, Bacteria METHICILLIN RESISTANT STAPHYLOCOCCUS AUREUS       Susceptibility   Methicillin resistant staphylococcus aureus - MIC*    CIPROFLOXACIN >=8 RESISTANT Resistant     ERYTHROMYCIN >=8 RESISTANT Resistant     GENTAMICIN <=0.5 SENSITIVE Sensitive      OXACILLIN >=4 RESISTANT Resistant     TETRACYCLINE <=1 SENSITIVE Sensitive     VANCOMYCIN <=0.5 SENSITIVE Sensitive     TRIMETH/SULFA >=320 RESISTANT Resistant     CLINDAMYCIN <=0.25 SENSITIVE Sensitive     RIFAMPIN <=0.5 SENSITIVE Sensitive     Inducible Clindamycin NEGATIVE Sensitive     LINEZOLID 2 SENSITIVE Sensitive     * MODERATE METHICILLIN RESISTANT STAPHYLOCOCCUS AUREUS  Lactic acid, plasma  Result Value Ref Range   Lactic Acid, Venous 1.8 0.5 - 1.9 mmol/L  Lactic acid, plasma  Result Value Ref Range   Lactic Acid, Venous 1.6 0.5 - 1.9 mmol/L  CBC with Differential/Platelet  Result Value Ref Range   WBC 11.4 (H) 4.0 - 10.5 K/uL   RBC 4.28 4.22 - 5.81 MIL/uL   Hemoglobin 13.4 13.0 - 17.0 g/dL   HCT 01.0 93.2 - 35.5 %   MCV 92.1 80.0 - 100.0 fL   MCH 31.3 26.0 - 34.0 pg   MCHC 34.0 30.0 - 36.0 g/dL   RDW 73.2 20.2 - 54.2 %   Platelets 187 150 - 400 K/uL   nRBC 0.0 0.0 - 0.2 %   Neutrophils Relative % 82 %   Neutro Abs 9.3 (H) 1.7 - 7.7 K/uL   Lymphocytes Relative 9 %   Lymphs Abs 1.0 0.7 - 4.0 K/uL   Monocytes Relative 8 %   Monocytes Absolute 0.9 0.1 - 1.0 K/uL   Eosinophils Relative 0 %   Eosinophils Absolute 0.0 0.0 - 0.5 K/uL   Basophils Relative 0 %   Basophils Absolute 0.0 0.0 - 0.1 K/uL   Immature Granulocytes 1 %   Abs Immature Granulocytes 0.06 0.00 - 0.07 K/uL  Comprehensive metabolic panel  Result Value Ref Range   Sodium 134 (L) 135 - 145 mmol/L   Potassium 3.3 (L) 3.5 -  5.1 mmol/L   Chloride 103 98 - 111 mmol/L   CO2 24 22 - 32 mmol/L   Glucose, Bld 210 (H) 70 - 99 mg/dL   BUN 11 6 - 20 mg/dL   Creatinine, Ser 2.95 0.61 - 1.24 mg/dL   Calcium 8.6 (L) 8.9 - 10.3 mg/dL   Total Protein 7.4 6.5 - 8.1 g/dL   Albumin 4.1 3.5 - 5.0 g/dL   AST 21 15 - 41 U/L   ALT 24 0 - 44 U/L   Alkaline Phosphatase 89 38 - 126 U/L   Total Bilirubin 1.0 0.3 - 1.2 mg/dL   GFR, Estimated >62 >13 mL/min   Anion gap 7 5 - 15  HIV Antibody (routine testing w rflx)   Result Value Ref Range   HIV Screen 4th Generation wRfx Non Reactive Non Reactive  D-dimer, quantitative  Result Value Ref Range   D-Dimer, Quant 1.20 (H) 0.00 - 0.50 ug/mL-FEU  Comprehensive metabolic panel  Result Value Ref Range   Sodium 135 135 - 145 mmol/L   Potassium 3.2 (L) 3.5 - 5.1 mmol/L   Chloride 104 98 - 111 mmol/L   CO2 21 (L) 22 - 32 mmol/L   Glucose, Bld 195 (H) 70 - 99 mg/dL   BUN 11 6 - 20 mg/dL   Creatinine, Ser 0.86 0.61 - 1.24 mg/dL   Calcium 8.4 (L) 8.9 - 10.3 mg/dL   Total Protein 7.0 6.5 - 8.1 g/dL   Albumin 3.7 3.5 - 5.0 g/dL   AST 18 15 - 41 U/L   ALT 21 0 - 44 U/L   Alkaline Phosphatase 82 38 - 126 U/L   Total Bilirubin 1.3 (H) 0.3 - 1.2 mg/dL   GFR, Estimated >57 >84 mL/min   Anion gap 10 5 - 15  CBC  Result Value Ref Range   WBC 13.2 (H) 4.0 - 10.5 K/uL   RBC 4.30 4.22 - 5.81 MIL/uL   Hemoglobin 13.6 13.0 - 17.0 g/dL   HCT 69.6 29.5 - 28.4 %   MCV 92.6 80.0 - 100.0 fL   MCH 31.6 26.0 - 34.0 pg   MCHC 34.2 30.0 - 36.0 g/dL   RDW 13.2 44.0 - 10.2 %   Platelets 194 150 - 400 K/uL   nRBC 0.0 0.0 - 0.2 %  Comprehensive metabolic panel  Result Value Ref Range   Sodium 137 135 - 145 mmol/L   Potassium 3.4 (L) 3.5 - 5.1 mmol/L   Chloride 105 98 - 111 mmol/L   CO2 24 22 - 32 mmol/L   Glucose, Bld 193 (H) 70 - 99 mg/dL   BUN 11 6 - 20 mg/dL   Creatinine, Ser 7.25 0.61 - 1.24 mg/dL   Calcium 8.6 (L) 8.9 - 10.3 mg/dL   Total Protein 7.1 6.5 - 8.1 g/dL   Albumin 3.7 3.5 - 5.0 g/dL   AST 21 15 - 41 U/L   ALT 21 0 - 44 U/L   Alkaline Phosphatase 81 38 - 126 U/L   Total Bilirubin 1.6 (H) 0.3 - 1.2 mg/dL   GFR, Estimated >36 >64 mL/min   Anion gap 8 5 - 15  Hemoglobin A1c  Result Value Ref Range   Hgb A1c MFr Bld 9.7 (H) 4.8 - 5.6 %   Mean Plasma Glucose 231.69 mg/dL  Glucose, capillary  Result Value Ref Range   Glucose-Capillary 239 (H) 70 - 99 mg/dL  Basic metabolic panel  Result Value Ref Range   Sodium 134 (L) 135 -  145 mmol/L    Potassium 3.1 (L) 3.5 - 5.1 mmol/L   Chloride 105 98 - 111 mmol/L   CO2 24 22 - 32 mmol/L   Glucose, Bld 230 (H) 70 - 99 mg/dL   BUN 14 6 - 20 mg/dL   Creatinine, Ser 1.61 0.61 - 1.24 mg/dL   Calcium 8.2 (L) 8.9 - 10.3 mg/dL   GFR, Estimated >09 >60 mL/min   Anion gap 5 5 - 15  CBC  Result Value Ref Range   WBC 13.3 (H) 4.0 - 10.5 K/uL   RBC 4.29 4.22 - 5.81 MIL/uL   Hemoglobin 13.4 13.0 - 17.0 g/dL   HCT 45.4 09.8 - 11.9 %   MCV 93.5 80.0 - 100.0 fL   MCH 31.2 26.0 - 34.0 pg   MCHC 33.4 30.0 - 36.0 g/dL   RDW 14.7 82.9 - 56.2 %   Platelets 202 150 - 400 K/uL   nRBC 0.0 0.0 - 0.2 %  Magnesium  Result Value Ref Range   Magnesium 2.1 1.7 - 2.4 mg/dL  Glucose, capillary  Result Value Ref Range   Glucose-Capillary 196 (H) 70 - 99 mg/dL   Comment 1 Notify RN   Glucose, capillary  Result Value Ref Range   Glucose-Capillary 201 (H) 70 - 99 mg/dL  Glucose, capillary  Result Value Ref Range   Glucose-Capillary 188 (H) 70 - 99 mg/dL  Basic metabolic panel  Result Value Ref Range   Sodium 136 135 - 145 mmol/L   Potassium 4.0 3.5 - 5.1 mmol/L   Chloride 107 98 - 111 mmol/L   CO2 22 22 - 32 mmol/L   Glucose, Bld 265 (H) 70 - 99 mg/dL   BUN 17 6 - 20 mg/dL   Creatinine, Ser 1.30 0.61 - 1.24 mg/dL   Calcium 8.2 (L) 8.9 - 10.3 mg/dL   GFR, Estimated >86 >57 mL/min   Anion gap 7 5 - 15  CBC  Result Value Ref Range   WBC 9.3 4.0 - 10.5 K/uL   RBC 4.15 (L) 4.22 - 5.81 MIL/uL   Hemoglobin 13.0 13.0 - 17.0 g/dL   HCT 84.6 (L) 96.2 - 95.2 %   MCV 92.3 80.0 - 100.0 fL   MCH 31.3 26.0 - 34.0 pg   MCHC 33.9 30.0 - 36.0 g/dL   RDW 84.1 32.4 - 40.1 %   Platelets 204 150 - 400 K/uL   nRBC 0.0 0.0 - 0.2 %  Magnesium  Result Value Ref Range   Magnesium 2.3 1.7 - 2.4 mg/dL  Glucose, capillary  Result Value Ref Range   Glucose-Capillary 293 (H) 70 - 99 mg/dL  Glucose, capillary  Result Value Ref Range   Glucose-Capillary 212 (H) 70 - 99 mg/dL  Glucose, capillary  Result Value Ref  Range   Glucose-Capillary 240 (H) 70 - 99 mg/dL  CBG monitoring, ED  Result Value Ref Range   Glucose-Capillary 234 (H) 70 - 99 mg/dL  CBG monitoring, ED  Result Value Ref Range   Glucose-Capillary 283 (H) 70 - 99 mg/dL  Troponin I (High Sensitivity)  Result Value Ref Range   Troponin I (High Sensitivity) 16 <18 ng/L  Troponin I (High Sensitivity)  Result Value Ref Range   Troponin I (High Sensitivity) 24 (H) <18 ng/L  Troponin I (High Sensitivity)  Result Value Ref Range   Troponin I (High Sensitivity) 19 (H) <18 ng/L      Assessment & Plan:   Problem List Items Addressed This Visit  Endocrine   Type 2 diabetes mellitus with proteinuria (HCC)    Chronic, ongoing.  A1c 9.7% in July, slight trend up from 10.5%.  He had initial visit with [facility] endo yesterday and scheduled to return on 01/15/23 -- he is aware not to miss this visit.  Urine ALB 150 January 2024, continue Lisinopril. - Continue Jardiance, Januvia, + insulin as ordered by previous endo.   - Poor tolerance to Metformin in past with AKI presenting x 2 trials and allergic to Sulfa. Would avoid GLP at this time due to patient history of pancreatitis x 3, concern this would flare. Educated patient at length on effect of diabetes from head to toe and increased risk for recurrent CVA due to poor control.   - Recommend they check his BS TID -- he would benefit from [facility] will work on this with CCM team in future.  Has been working with pharmacist, SW, and nurse case manager. - Goal A1C <6.5% due to CVA history -- which reiterated at length with patient today.  Discussed at length what an A1c at his current level could cause. - Foot and eye exam up to date. - Vaccinations up to date        Other   Abscess    Acute and overall healed at this time.  Continue to monitor.  Suspect this came from bed bug bites, noted bites to bilateral ankles on exam today.  He does endorse bed bugs at home.  Educated him on what  to treat with -- ensure all linen and clothing is fully washed and obtain bed bug spray to use on all mattresses, couches, and rugs in home.  Recommend he check all pets in house.        Relevant Orders   CBC with Differential/Platelet   Basic metabolic panel   Bed bug bite    Noted to bilateral ankles and healed.  He does endorse bed bugs at home.  Educated him on what to treat with -- ensure all linen and clothing is fully washed and obtain bed bug spray to use on all mattresses, couches, and rugs in home.  Recommend he check all pets in house.        Frequent falls    Ongoing issue at baseline, is to use walker at all times, but did not bring with him today.  Recommend he consistently use for fall prevention.  Will place home health referral which would offer benefit for fall prevention and strengthening.      Relevant Orders   Ambulatory referral to Home Health   Sepsis [facility]) - Primary    Acute and improved.  Will recheck CBC and CMP today.  Overall area of abscess is fully healed and baseline vital signs.  No red flags.      Relevant Orders   CBC with Differential/Platelet   Basic metabolic panel     Follow up plan: Return for should already have follow-up scheduled for around 01/01/23, if not please schedule.

## 2022-11-01 ENCOUNTER — Encounter (INDEPENDENT_AMBULATORY_CARE_PROVIDER_SITE_OTHER): Payer: Medicare Other

## 2022-11-01 ENCOUNTER — Ambulatory Visit (INDEPENDENT_AMBULATORY_CARE_PROVIDER_SITE_OTHER): Payer: Medicare Other | Admitting: Nurse Practitioner

## 2022-11-01 ENCOUNTER — Ambulatory Visit: Payer: Self-pay | Admitting: *Deleted

## 2022-11-01 LAB — CBC WITH DIFFERENTIAL/PLATELET
Basophils Absolute: 0 10*3/uL (ref 0.0–0.2)
Basos: 0 %
EOS (ABSOLUTE): 0.2 10*3/uL (ref 0.0–0.4)
Eos: 4 %
Hematocrit: 39.6 % (ref 37.5–51.0)
Hemoglobin: 12.8 g/dL — ABNORMAL LOW (ref 13.0–17.7)
Immature Grans (Abs): 0 10*3/uL (ref 0.0–0.1)
Immature Granulocytes: 0 %
Lymphocytes Absolute: 1 10*3/uL (ref 0.7–3.1)
Lymphs: 23 %
MCH: 31.2 pg (ref 26.6–33.0)
MCHC: 32.3 g/dL (ref 31.5–35.7)
MCV: 97 fL (ref 79–97)
Monocytes Absolute: 0.4 10*3/uL (ref 0.1–0.9)
Monocytes: 8 %
Neutrophils Absolute: 3 10*3/uL (ref 1.4–7.0)
Neutrophils: 65 %
Platelets: 137 10*3/uL — ABNORMAL LOW (ref 150–450)
RBC: 4.1 x10E6/uL — ABNORMAL LOW (ref 4.14–5.80)
RDW: 12.8 % (ref 11.6–15.4)
WBC: 4.5 10*3/uL (ref 3.4–10.8)

## 2022-11-01 LAB — BASIC METABOLIC PANEL
BUN/Creatinine Ratio: 14 (ref 9–20)
BUN: 14 mg/dL (ref 6–24)
CO2: 22 mmol/L (ref 20–29)
Calcium: 9.1 mg/dL (ref 8.7–10.2)
Chloride: 104 mmol/L (ref 96–106)
Creatinine, Ser: 1 mg/dL (ref 0.76–1.27)
Glucose: 335 mg/dL — ABNORMAL HIGH (ref 70–99)
Potassium: 4.3 mmol/L (ref 3.5–5.2)
Sodium: 140 mmol/L (ref 134–144)
eGFR: 87 mL/min/{1.73_m2} (ref >59)

## 2022-11-01 NOTE — Telephone Encounter (Signed)
Summary: hospital visits   Pt's wife is calling about recent hospital visit the pt has had.      Chief Complaint: Information Symptoms: NA Frequency: NA Pertinent Negatives: Patient denies NA Disposition: [] ED /[] Urgent Care (no appt availability in office) / [] Appointment(In office/virtual)/ []  [facility]/ [] Home Care/ [] Refused Recommended Disposition /[] [facility]/ []  Follow-up with PCP Additional Notes:  Pt's wife calling, on DPR. Questioning when pt had surgeries from 2022-2024. States is filling out disability forms. Assisted with dates and surgeries as noted in chart. Advised to CB if any other questions arise. Verbalizes understanding.  Reason for Disposition  General information question, no triage required and triager able to answer question  Answer Assessment - Initial Assessment Questions 1. REASON FOR CALL or QUESTION: "What is your reason for calling today?" or "How can I best help you?" or "What question do you have that I can help answer?"     When did pt have gallbladder surgery  Protocols used: Information Only Call - No Triage-A-AH

## 2022-11-01 NOTE — Progress Notes (Signed)
Good morning [name], please let [name] and [name] know his labs have returned.  CBC shows no elevations in white blood cell count, meaning no infection.  The hemoglobin is a little low, very mild, try to add more deep leafy greens to diet as they are full of iron the help this.  Platelets a little low, these help clot your blood, we will recheck next visit.  Kidney function stable, but sugar remains elevated.  Continue diabetes regimen and visits with endocrinology.  Any questions? Keep being amazing!!  Thank you for allowing me to participate in your care.  I appreciate you. Kindest regards, 

## 2022-11-05 DIAGNOSIS — E1165 Type 2 diabetes mellitus with hyperglycemia: Secondary | ICD-10-CM | POA: Diagnosis not present

## 2022-11-05 DIAGNOSIS — Z794 Long term (current) use of insulin: Secondary | ICD-10-CM | POA: Diagnosis not present

## 2022-11-05 NOTE — Addendum Note (Signed)
Addended by: Aura Dials T on: 11/05/2022 09:28 AM   Modules accepted: Orders

## 2022-11-06 ENCOUNTER — Other Ambulatory Visit: Payer: Medicare Other

## 2022-11-06 NOTE — Progress Notes (Signed)
   11/06/2022  Patient ID: Alex Dam., male   DOB: Sep 17, 1960, 62 y.o.   MRN: [id number]  Patient outreach tor scheduled telephone visit to complete LIS application for Medicare Extra Help  -Patient/wife do not have all needed information for LIS application -States father-in-law was assisting with applying for Medicaid and are not sure if he has been approved or not -Patient/wife to follow-up with Mr. Swindall father to see if Medicaid has been approved -I will also see if LCSW has any additional information and follow-up with patient/wife next week  Lenna Gilford, PharmD, DPLA

## 2022-11-07 ENCOUNTER — Ambulatory Visit: Payer: Self-pay | Admitting: *Deleted

## 2022-11-07 NOTE — Telephone Encounter (Signed)
  Chief Complaint: Bee Stings Symptoms: Multiple bee stings, unsure of type of bee "Must have stepped in nest." Frequency: Just  prior to call Pertinent Negatives: Patient denies  Disposition: [x] ED /[] Urgent Care (no appt availability in office) / [] Appointment(In office/virtual)/ []  [facility]/ [] Home Care/ [] Refused Recommended Disposition /[] [facility]/ []  Follow-up with PCP Additional Notes: Wife states pt's shirt was covered with bees. Bites on stomach, head, back. Facial swelling. States Epi-pen expired. Reports has had bad reaction to stings in past. Advised call EMS. States will do so now. Reason for Disposition  Attacked by swarm of bees now  Answer Assessment - Initial Assessment Questions 1. TYPE: "What type of sting was it?" (bee, yellow jacket, etc.)      Bee 2. ONSET: "When did it occur?"      Prior to call 3. LOCATION: "Where is the sting located?"  "How many stings?"     Back, stomach,head, unsure 4. SWELLING SIZE: "How big is the swelling?" (e.g., inches or cm)     Eyes swelling 5. REDNESS: "Is the area red or pink?" If Yes, ask: "What size is area of redness?" (e.g., inches or cm). "When did the redness start?"      6. PAIN: "Is there any pain?" If Yes, ask: "How bad is it?"  (Scale 1-10; or mild, moderate, severe)   7. ITCHING: "Is there any itching?" If Yes, ask: "How bad is it?"       8. RESPIRATORY DISTRESS: "Describe your breathing."      9. PRIOR REACTIONS: "Have you had any severe allergic reactions to stings in the past?" if yes, ask: "What happened?"     Yes 10. OTHER SYMPTOMS: "Do you have any other symptoms?" (e.g., abdomen pain, face or tongue swelling, new rash elsewhere, vomiting)      Facial swelling  Protocols used: Bee or Yellow Jacket Sting-A-AH

## 2022-11-08 MED ORDER — EPINEPHRINE 0.3 MG/0.3ML IJ SOAJ: INTRAMUSCULAR | 1 refills | Status: AC

## 2022-11-08 NOTE — Telephone Encounter (Signed)
Called and spoke to patient's wife. Wife states that the patient is doing better today. States they did call EMS and they came out and checked the patient. She states that the patient is still a little swollen where he was stung but is doing ok. Requesting for patient to have a new RX for an epipen sent in.

## 2022-11-12 ENCOUNTER — Encounter: Payer: Self-pay | Admitting: Cardiovascular Disease

## 2022-11-12 ENCOUNTER — Ambulatory Visit (INDEPENDENT_AMBULATORY_CARE_PROVIDER_SITE_OTHER): Payer: Medicare Other | Admitting: Cardiovascular Disease

## 2022-11-12 ENCOUNTER — Telehealth: Payer: Self-pay | Admitting: *Deleted

## 2022-11-12 VITALS — BP 120/75 | HR 63 | Ht 64.0 in | Wt 168.6 lb

## 2022-11-12 DIAGNOSIS — E1169 Type 2 diabetes mellitus with other specified complication: Secondary | ICD-10-CM | POA: Diagnosis not present

## 2022-11-12 DIAGNOSIS — I209 Angina pectoris, unspecified: Secondary | ICD-10-CM

## 2022-11-12 DIAGNOSIS — R55 Syncope and collapse: Secondary | ICD-10-CM

## 2022-11-12 DIAGNOSIS — E1159 Type 2 diabetes mellitus with other circulatory complications: Secondary | ICD-10-CM

## 2022-11-12 DIAGNOSIS — R0789 Other chest pain: Secondary | ICD-10-CM

## 2022-11-12 DIAGNOSIS — E785 Hyperlipidemia, unspecified: Secondary | ICD-10-CM

## 2022-11-12 DIAGNOSIS — I152 Hypertension secondary to endocrine disorders: Secondary | ICD-10-CM

## 2022-11-12 DIAGNOSIS — E1165 Type 2 diabetes mellitus with hyperglycemia: Secondary | ICD-10-CM | POA: Diagnosis not present

## 2022-11-12 DIAGNOSIS — I6523 Occlusion and stenosis of bilateral carotid arteries: Secondary | ICD-10-CM

## 2022-11-12 NOTE — Progress Notes (Signed)
Cardiology Office Note   Date:  11/12/2022   ID:  [name]., DOB 02-02-1961, MRN [id number]  PCP:  Marjie Skiff, NP  Cardiologist:  Adrian Blackwater, MD      History of Present Illness: [name]. is a 62 y.o. male who presents for  Chief Complaint  Patient presents with   Follow-up    Hospital F/U     Had chest pain while in hospital in July, and told to be seen by me.  Chest Pain  This is a new problem. The current episode started more than 1 month ago. The onset quality is sudden. The problem has been waxing and waning. The pain is at a severity of 10/10. The pain is severe. The quality of the pain is described as heavy. Associated symptoms include dizziness, exertional chest pressure and syncope.      Past Medical History:  Diagnosis Date   Arrhythmia    Asthma    CCC (chronic calculous cholecystitis)    COPD (chronic obstructive pulmonary disease) (HCC)    Cyst of kidney, acquired    Diabetes mellitus without complication (HCC) 2013   type 2   Diverticulosis    Edentulous    Fatty liver    Gallbladder polyp    GERD (gastroesophageal reflux disease)    Heart murmur    History of chicken pox    History of measles as a child    History of PKU    Hyperlipidemia    Hypertension    IBS (irritable bowel syndrome)    Irregular heart beat    Mentally challenged    Pancreatitis    PONV (postoperative nausea and vomiting)    Stroke (HCC) 03/02/2019   vision issues - right eye/right sided weakness   Vision impairment    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     Past Surgical History:  Procedure Laterality Date   Cardiac Catherization     [facility]   CARDIAC CATHETERIZATION     [facility]   CATARACT EXTRACTION W/PHACO Left 06/05/2021   Procedure: CATARACT EXTRACTION PHACO AND INTRAOCULAR LENS PLACEMENT (IOC) LEFT DIABETIC 3.75 00:28.0;  Surgeon: Nevada Crane, MD;  Location: [facility];  Service: Ophthalmology;  Laterality:  Left;  Diabetic   CATARACT EXTRACTION W/PHACO Right 07/10/2021   Procedure: CATARACT EXTRACTION PHACO AND INTRAOCULAR LENS PLACEMENT (IOC) RIGHT DIABETIC;  Surgeon: Nevada Crane, MD;  Location: [facility];  Service: Ophthalmology;  Laterality: Right;  Diabetic 2.66 00:24.4   COLONOSCOPY     COLONOSCOPY WITH PROPOFOL N/A 08/10/2020   Procedure: COLONOSCOPY WITH PROPOFOL;  Surgeon: Toney Reil, MD;  Location: [facility] ENDOSCOPY;  Service: Gastroenterology;  Laterality: N/A;  Has ankle monitor; (Parole officer "Ms Ward Givens needs a time before Monday [phone]" - per patient's wife)   ESOPHAGOGASTRODUODENOSCOPY (EGD) WITH PROPOFOL N/A 12/27/2016   Procedure: ESOPHAGOGASTRODUODENOSCOPY (EGD) WITH PROPOFOL;  Surgeon: Toney Reil, MD;  Location: [facility] ENDOSCOPY;  Service: Gastroenterology;  Laterality: N/A;   HEMORRHOID SURGERY     KNEE SURGERY     cyst removed   Ligament Removal Left    of left thumb: dr. Alberteen Spindle   ligament removal  of left thumb     Dr. Alberteen Spindle   NM GATED MYOCARDIAL STUDY (ARMX HX)  06/23/2014   Paraschos. Normal   WISDOM TOOTH EXTRACTION       Current Outpatient Medications  Medication Sig Dispense Refill   albuterol (VENTOLIN HFA) 108 (90 Base)  MCG/ACT inhaler Inhale 2 puffs into the lungs every 6 (six) hours as needed. 18 g 0   aspirin 81 MG EC tablet Take 1 tablet (81 mg total) by mouth daily. 180 tablet 0   Blood Glucose Monitoring Suppl (ONE TOUCH ULTRA 2) w/Device KIT Use to check blood sugar 4 times a day 1 kit 0   clopidogrel (PLAVIX) 75 MG tablet Take 1 tablet (75 mg total) by mouth daily. 90 tablet 4   empagliflozin (JARDIANCE) 25 MG TABS tablet Take 1 tablet (25 mg total) by mouth daily. 90 tablet 4   EPINEPHrine 0.3 mg/0.3 mL IJ SOAJ injection INJECT 1 SYRINGE INTO OUTER THIGH ONCE AS NEEDED FOR SEVERE ALLERGIC REACTION. Strength 0.3 MG/0.3ML 2 each 1   fenofibrate 54 MG tablet Take 1 tablet (54 mg total) by mouth daily. 90 tablet 4    fluticasone furoate-vilanterol (BREO ELLIPTA) 100-25 MCG/ACT AEPB Inhale 1 puff into the lungs daily. 60 each 4   gabapentin (NEURONTIN) 300 MG capsule Take 1 capsule (300 mg total) by mouth 2 (two) times daily. 180 capsule 4   glucose blood (ONETOUCH ULTRA) test strip TEST BLOOD SUGAR 5 TIMES A DAY 300 each 4   HYDROcodone-acetaminophen (NORCO/VICODIN) 5-325 MG tablet Take 1 tablet by mouth every 6 (six) hours as needed for moderate pain.     icosapent Ethyl (VASCEPA) 1 g capsule Take 1 capsule (1 g total) by mouth 2 (two) times daily. 180 capsule 4   Insulin Degludec (TRESIBA) 100 UNIT/ML SOLN Inject 40 Units into the skin 2 (two) times daily. (Patient taking differently: Inject 45 Units into the skin 2 (two) times daily.) 30 mL 4   Insulin Lispro Junior KwikPen (HUMALOG JR) 100 UNIT/ML KwikPen Inject 42 Units into the skin 3 (three) times daily before meals.     lisinopril (ZESTRIL) 20 MG tablet Take 1 tablet (20 mg total) by mouth daily. 90 tablet 4   Misc. Devices (PULSE OXIMETER FOR FINGER) MISC To check O2 saturations once daily with asthma and document + check if any shortness of breath 1 each 1   montelukast (SINGULAIR) 10 MG tablet Take 1 tablet (10 mg total) by mouth at bedtime. 90 tablet 4   mupirocin ointment (BACTROBAN) 2 % Apply 1 Application topically 2 (two) times daily. 22 g 3   nitroGLYCERIN (NITROSTAT) 0.4 MG SL tablet Place 1 tablet (0.4 mg total) under the tongue every 5 (five) minutes as needed for chest pain. 50 tablet 3   omeprazole (PRILOSEC) 20 MG capsule Take 1 capsule (20 mg total) by mouth daily. 90 capsule 4   OneTouch Delica Lancets 33G MISC USE AS DIRECTED 300 each 3   rosuvastatin (CRESTOR) 40 MG tablet Take 1 tablet (40 mg total) by mouth daily. 90 tablet 4   sitaGLIPtin (JANUVIA) 100 MG tablet Take 1 tablet (100 mg total) by mouth daily. 90 tablet 4   triamcinolone cream (KENALOG) 0.1 % Apply 1 Application topically 2 (two) times daily. (Patient taking differently:  Apply 1 Application topically 2 (two) times daily. Arms and legs) 30 g 1   valACYclovir (VALTREX) 500 MG tablet Take 500 mg by mouth 3 (three) times daily.     Vitamin D, Ergocalciferol, (DRISDOL) 1.25 MG (50000 UNIT) CAPS capsule Take 50,000 Units by mouth once a week.     No current facility-administered medications for this visit.    Allergies:   Sulfa antibiotics, Bee venom, Morphine, and Other    Social History:   reports that he  has never smoked. He has never been exposed to tobacco smoke. He has never used smokeless tobacco. He reports that he does not drink alcohol and does not use drugs.   Family History:  family history includes Cancer in his maternal grandfather, maternal grandmother, mother, and paternal grandfather; Diabetes in his father; Heart disease in his father.    ROS:     Review of Systems  Cardiovascular:  Positive for chest pain and syncope.  Neurological:  Positive for dizziness.      All other systems are reviewed and negative.    PHYSICAL EXAM: VS:  BP 120/75   Pulse 63   Ht 5\' 4"  (1.626 m)   Wt 168 lb 9.6 oz (76.5 kg)   SpO2 96%   BMI 28.94 kg/m  , BMI Body mass index is 28.94 kg/m. Last weight:  Wt Readings from Last 3 Encounters:  11/12/22 168 lb 9.6 oz (76.5 kg)  10/31/22 173 lb 12.8 oz (78.8 kg)  10/10/22 171 lb 11.8 oz (77.9 kg)     Physical Exam    EKG:   Recent Labs: 03/23/2022: TSH 1.070 10/09/2022: ALT 21 10/11/2022: Magnesium 2.3 10/31/2022: BUN 14; Creatinine, Ser 1.00; Hemoglobin 12.8; Platelets 137; Potassium 4.3; Sodium 140    REASON FOR VISIT  Referred by Dr.Gabryela Kimbrell Wel[place] Flakes.        TESTS  Imaging: Computed Tomographic Angiography:  Cardiac multidetector CT was performed paying particular attention to the coronary arteries for the diagnosis of: Chest pain. Diagnostic Drugs:  Administered iohexol (Omnipaque) through an antecubital vein and images from the examination were analyzed for the presence and extent of coronary  artery disease, using 3D image processing software. 100 mL of non-ionic contrast (Omnipaque) was used.        TEST CONCLUSIONS  Quality of study: Excellent.  1-Calcium score: 0.  2-Right dominant system.  3-Normal coronaries.      Adrian Blackwater MD  Electronically signed by: Adrian Blackwater     Date: 11/08/2017 12:43 REASON FOR VISIT  Visit for: Echocardiogram - Chest pain  Sex: Male    wt= 187 lbs.  BP= 126/82  Height= 62 inches.        TESTS  Imaging: Echocardiogram:  An echocardiogram in (2-d) mode was performed and in Doppler mode with color flow velocity mapping was performed. The aortic valve cusps are normal 1.7 cm, flow velocity was normal 1.7 m/s, and normal calculated aortic valve systolic mean flow gradient 5.9 mmHg. Normal mitral valve diastolic peak flow velocity E .84 m/s and E/A ratio was normal 1.3. Aortic root diameter 2.9 cm. The LVOT internal diameter was normal 2.0 cm and flow velocity was normal 1.3 m/s. LV systolic dimension 2.7 cm, diastolic 4.1 cm, posterior wall thickness 1.0 cm, fractional shortening 34 %, and EF 63 %. IVS thickness 1.4cm. LA dimension 4.4 cm  RIGHT atrium= 16.3 cm2. Mitral Valve =  DT= .270  A- wave duration = .130 m.sec. Tricuspid Valve =  TR jet V= 2.3      RAP= 5  RVSP= 26.3 mmHg. Tricuspid Valve has Mild Regurgitation. Pulmonic Valve= PIEDV= .47 m/s. Mitral Valve has Mild Regurgitation. Aortic Valve has Trace Regurgitation. Pulmonic Valve has Trace Regurgitation.     ASSESSMENT  Technically difficult study due to body habitus.   Mildly dilated left atrium with left ventricle, right atrium and ventricle, and aorta appearing normal in size.  Normal LV systolic and diastolic functions.  Normal wall motion.  Mild left ventricular hypertrophy.  Trace pulmonary  regurgitation.  Mild tricuspid regurgitation.   Normal pulmonary artery pressure.   Mild mitral regurgitation.  Trace aortic regurgitation.   No pericardial  effusion.  Unable to visualize IVC and abdominal aorta in subcostal view.     THERAPY   Referring physician: Laurier Nancy  Sonographer: Danford Bad, RCS.      Adrian Blackwater MD  Electronically signed by: Adrian Blackwater     Date: 11/06/2017 12:46 Lipid Panel    Component Value Date/Time   CHOL 121 07/13/2022 0835   CHOL 136 12/02/2018 1408   TRIG 136 07/13/2022 0835   TRIG 145 12/02/2018 1408   HDL 36 (L) 07/13/2022 0835   CHOLHDL 5.3 03/03/2019 0447   VLDL 44 (H) 03/03/2019 0447   VLDL 229 (H) 12/02/2018 1408   LDLCALC 61 07/13/2022 0835      Other studies Reviewed: Additional studies/ records that were reviewed today include:  Review of the above records demonstrates:       No data to display            ASSESSMENT AND PLAN:    ICD-10-CM   1. Angina pectoris (HCC)  I20.9 MYOCARDIAL PERFUSION IMAGING    PCV ECHOCARDIOGRAM COMPLETE    2. Atherosclerosis of both carotid arteries  I65.23 MYOCARDIAL PERFUSION IMAGING    PCV ECHOCARDIOGRAM COMPLETE    3. Hypertension associated with diabetes (HCC)  E11.59 MYOCARDIAL PERFUSION IMAGING   I15.2 PCV ECHOCARDIOGRAM COMPLETE    4. Hyperlipidemia associated with type 2 diabetes mellitus (HCC)  E11.69 MYOCARDIAL PERFUSION IMAGING   E78.5 PCV ECHOCARDIOGRAM COMPLETE    5. Uncontrolled type 2 diabetes mellitus with hyperglycemia (HCC)  E11.65 MYOCARDIAL PERFUSION IMAGING    PCV ECHOCARDIOGRAM COMPLETE    6. Other chest pain  R07.89 MYOCARDIAL PERFUSION IMAGING    PCV ECHOCARDIOGRAM COMPLETE   Has exertional chest pain and syncope, advise echo, stress test.    7. Syncope, unspecified syncope type  R55 MYOCARDIAL PERFUSION IMAGING    PCV ECHOCARDIOGRAM COMPLETE       Problem List Items Addressed This Visit       Cardiovascular and Mediastinum   Hypertension associated with diabetes (HCC)   Relevant Orders   MYOCARDIAL PERFUSION IMAGING   PCV ECHOCARDIOGRAM COMPLETE   Angina pectoris (HCC) - Primary    Relevant Orders   MYOCARDIAL PERFUSION IMAGING   PCV ECHOCARDIOGRAM COMPLETE   Atherosclerosis of both carotid arteries   Relevant Orders   MYOCARDIAL PERFUSION IMAGING   PCV ECHOCARDIOGRAM COMPLETE     Endocrine   Hyperlipidemia associated with type 2 diabetes mellitus (HCC)   Relevant Orders   MYOCARDIAL PERFUSION IMAGING   PCV ECHOCARDIOGRAM COMPLETE   Uncontrolled type 2 diabetes mellitus with hyperglycemia (HCC)   Relevant Orders   MYOCARDIAL PERFUSION IMAGING   PCV ECHOCARDIOGRAM COMPLETE   Other Visit Diagnoses     Other chest pain       Has exertional chest pain and syncope, advise echo, stress test.   Relevant Orders   MYOCARDIAL PERFUSION IMAGING   PCV ECHOCARDIOGRAM COMPLETE   Syncope, unspecified syncope type       Relevant Orders   MYOCARDIAL PERFUSION IMAGING   PCV ECHOCARDIOGRAM COMPLETE          Disposition:   Return in about 4 weeks (around 12/10/2022) for echo, stress test and f/u.    Total time spent: 45 minutes  Signed,  Adrian Blackwater, MD  11/12/2022 1:57 PM    [facility]

## 2022-11-13 ENCOUNTER — Telehealth: Payer: Self-pay | Admitting: *Deleted

## 2022-11-13 NOTE — Patient Outreach (Signed)
  Care Coordination   Follow Up Visit Note   11/13/2022 late entry Name: Thomas Buitrago. MRN: [id number] DOB: 07/02/1960  [name] is a 62 y.o. year old male who sees [name], Corrie Dandy T, NP for primary care. I spoke with  [name] 's spouse by phone on 11/12/22.  What matters to the patients health and wellness today?  Completion of medicaid application to assist with medical bills and medication costs    Goals Addressed             This Visit's Progress    Assistance with Medicaid application status       Interventions Today    Flowsheet Row Most Recent Value  General Interventions   General Interventions Discussed/Reviewed General Interventions Discussed, General Interventions Reviewed, Doctor Visits, Level of Care  Doctor Visits Discussed/Reviewed Specialist  [Patient's spouse confirms that patient was seen by the heart doctor today]  PCP/Specialist Visits Compliance with follow-up visit  [Echo and stress test to be completed in October]  Level of Care Applications  Applications Medicaid  [Per patient's spouse, Medicaid application still in progress will have to call DSS to confirm status-permission provided to contact patient's father who is assisting with the medicaid application process [phone]  Education Interventions   Applications Medicaid  [Per patient's spouse, Medicaid application still in progress will have to call DSS to confirm status-permission provided to contact patient's father who is assisting with the medicaid application process [phone]              SDOH assessments and interventions completed:  No     Care Coordination Interventions:  Yes, provided   Follow up plan: Follow up call scheduled for 11/20/22    Encounter Outcome:  Pt. Visit Completed

## 2022-11-13 NOTE — Patient Outreach (Signed)
  Care Coordination   Follow Up Visit Note   11/13/2022 Name: [name]. MRN: [id number] DOB: 1961-01-25  Alex Dam. is a 62 y.o. year old male who sees [name], Corrie Dandy T, NP for primary care. I spoke with  Alex Dam. 's father by phone today.  What matters to the patients health and wellness today?  Medicaid approval for medical care and medications . Patient's father currently assisting with Medicaid application and agrees to follow up on status. CSW to follow up with Medicaid worker on status as well.   Goals Addressed             This Visit's Progress    Assistance with Medicaid application status       Interventions Today    Flowsheet Row Most Recent Value  Chronic Disease   Chronic disease during today's visit Diabetes, Hypertension (HTN)  General Interventions   General Interventions Discussed/Reviewed General Interventions Reviewed, Level of Care  Level of Care Applications  Applications Medicaid  [patient's father contacted-confirms that all required information has been submitted for Medicaid as of 11/07/22.-determination pending Medicaid case manager assigned latosha wells [phone], [phone]-]  Education Interventions   Applications Medicaid  [patient's father contacted-confirms that all required information has been submitted for Medicaid as of 11/07/22.-determination pending Medicaid case manager assigned latosha wells [phone], [phone]-]              SDOH assessments and interventions completed:  No     Care Coordination Interventions:  Yes, provided   Follow up plan: Follow up call scheduled for 11/20/22    Encounter Outcome:  Pt. Visit Completed

## 2022-11-13 NOTE — Patient Instructions (Signed)
Visit Information  Thank you for taking time to visit with me today. Please don't hesitate to contact me if I can be of assistance to you.   Following are the goals we discussed today:  Please follow up with the Department of Social Services regarding the status of your medicaid application if possible Please continue to follow up with your provider's office with any questions or concerns regarding your medical care   Our next appointment is by telephone on 11/20/22 at 2pm  Please call the care guide team at [phone] if you need to cancel or reschedule your appointment.   If you are experiencing a Mental Health or Behavioral Health Crisis or need someone to talk to, please call 911   Patient verbalizes understanding of instructions and care plan provided today and agrees to view in MyChart. Active MyChart status and patient understanding of how to access instructions and care plan via MyChart confirmed with patient.     Telephone follow up appointment with care management team member scheduled for: 12/10/22  Verna Czech, LCSW Clinical Social Worker  [facility] Care Management [phone]

## 2022-11-16 ENCOUNTER — Telehealth: Payer: Self-pay

## 2022-11-16 ENCOUNTER — Other Ambulatory Visit: Payer: Self-pay

## 2022-11-16 ENCOUNTER — Other Ambulatory Visit: Payer: Medicare Other

## 2022-11-16 NOTE — Patient Outreach (Signed)
Care Management   Visit Note  11/16/2022 Name: [name]. MRN: [id number] DOB: Nov 15, 1960  Subjective: [name]. is a 62 y.o. year old male who is a primary care patient of Cannady, Dorie Rank, NP. The Care Management team was consulted for assistance.      Engaged with patient spoke with patient by telephone.    Goals Addressed             This Visit's Progress    RNCM Care Management  Expected Outcome:  Monitor, Self-Manage and Reduce Symptoms of Diabetes       Current Barriers:  Knowledge Deficits related to the need to check blood sugars and keep blood sugars in normal range Care Coordination needs related to the importance of monitoring blood sugars, eating healthy, maintaining good control of DM in a patient with DM Chronic Disease Management support and education needs related to effective management of DM Literacy barriers  Lab Results  Component Value Date   HGBA1C 9.7 (H) 10/09/2022  Saw endocrinologist recently A1C was 8.5%   Planned Interventions: Provided education to patient about basic DM disease process. The patient needs ongoing support and education with effective management of DM. The patients wife states currently he is eating better and checking his blood sugars and writing them down. The patient has been having up and down blood sugars. He has seen the new endocrinologist and will see again in October. Reviewed medications with patient and discussed importance of medication adherence. The patient is compliant with medications. He is taking his medications. He is working with the endocrinologist. Reviewed prescribed diet with patient heart healthy/ADA. The patient is doing better with ADA and options that will help with maintaining healthy blood sugars but having a hard time refraining from salt ; Counseled on importance of regular laboratory monitoring as prescribed. Review of goals of A1C and the patient is hoping his next A1C is much  better;        Discussed plans with patient for ongoing care management follow up and provided patient with direct contact information for care management team;      Provided patient with written educational materials related to hypo and hyperglycemia and importance of correct treatment. The patient denies any new falls.  Education on safety and monitoring for changes in the way he feels. Denies and lows. Highest he has seen is >300.        Reviewed scheduled/upcoming provider appointments including:12-18-2022 Advised patient, providing education and rationale, to check cbg before meals and at bedtime and when you have symptoms of low or high blood sugar and record. The endocrinologist had given the patient a continuous glucose reader but he kept knocking it off of his arm. He is trying this again but also doing finger sticks. His sugars today have been 138, 163, 130, and 154. Denies any acute highs or lows. call provider for findings outside established parameters;       Review of patient status, including review of consultants reports, relevant laboratory and other test results, and medications completed;       Advised patient to discuss changes in DM, questions, or concerns with provider;     Sees eye specialist in January. Is possibly going to have new surgery to help vision  Screening for signs and symptoms of depression related to chronic disease state;        Assessed social determinant of health barriers;         Symptom Management: Take  medications as prescribed   Attend all scheduled provider appointments Call provider office for new concerns or questions  call the Suicide and Crisis Lifeline: 988 call the [place] National Suicide Prevention Lifeline: 669-667-6945 or TTY: 856-564-5315 TTY (581) 707-0350) to talk to a trained counselor call 1-800-273-TALK (toll free, 24 hour hotline) if experiencing a Mental Health or Behavioral Health Crisis  keep appointment with eye doctor check feet  daily for cuts, sores or redness trim toenails straight across manage portion size wash and dry feet carefully every day wear comfortable, cotton socks wear comfortable, well-fitting shoes  Follow Up Plan: Telephone follow up appointment with care management team member scheduled for:12-17-2022 at 230 pm       RNCM Care Management  Expected Outcome:  Monitor, Self-Manage and Reduce Symptoms of: Asthma       Current Barriers:  Knowledge Deficits related to triggers that can cause exacerbation of his asthma Chronic Disease Management support and education needs related to effective management of Asthma  Planned Interventions: Provided patient with basic written and verbal Asthma education on self care/management/and exacerbation prevention. Denies any issues with his breathing. The patient states his breathing is stable. Monitoring for changes in his breathing related to allergies. Denies any new concerns at this time. Advised patient to track and manage Asthma triggers. Review of the weather changes, being aware of people with infections, and monitoring for changes in his breathing;  Provided written and verbal instructions on pursed lip breathing and utilized returned demonstration as teach back; Provided instruction about proper use of medications used for management of Asthma including inhalers; Advised patient to self assesses Asthma action plan zone and make appointment with provider if in the yellow zone for 48 hours without improvement; Advised patient to engage in light exercise as tolerated 3-5 days a week to aid in the the management of Asthma; Provided education about and advised patient to utilize infection prevention strategies to reduce risk of respiratory infection. Education and support given; Discussed the importance of adequate rest and management of fatigue with Asthma; Screening for signs and symptoms of depression related to chronic disease state;  Assessed social  determinant of health barriers;    Symptom Management: Take medications as prescribed   Attend all scheduled provider appointments Call provider office for new concerns or questions  call the Suicide and Crisis Lifeline: 988 call the [place] National Suicide Prevention Lifeline: 825-760-8661 or TTY: 972 031 7579 TTY 8678147048) to talk to a trained counselor call 1-800-273-TALK (toll free, 24 hour hotline) if experiencing a Mental Health or Behavioral Health Crisis   Follow Up Plan: Telephone follow up appointment with care management team member scheduled for: 12-17-2022 at 230 pm       RNCM Care Management Expected Outcome:  Monitor, Self-Manage, and Reduce Symptoms of Hypertension       Current Barriers:  Knowledge Deficits related to the importance for following a heart healthy/ADA diet and the need to restrict sodium in dietary habits  Chronic Disease Management support and education needs related to effective management of HTN  BP Readings from Last 3 Encounters:  11/12/22 120/75  10/31/22 (!) 116/57  10/11/22 128/73     Planned Interventions: Evaluation of current treatment plan related to hypertension self management and patient's adherence to plan as established by provider. The patients blood pressures are more stable now. The patient is trying to do better with his health and watching what he does. The patient saw the cardiologist this week and they want to do a stress  test to make sure everything is good due to having some passing out spells. Education provided. Will continue to monitor for changes and new needs.  Provided education to patient re: stroke prevention, s/s of heart attack and stroke; Reviewed prescribed diet heart healthy/ADA diet. The patient is more mindful of what he is eating and watching what he eats. He does like salt and sometimes uses too much salt. This is an ongoing obstacle but his wife states she is being diligent in trying to get him to understand  the importance of not eating salt and this will help his blood pressures too.  She has removed the salt shaker from the house and the patient is upset with her. She says that it is for his own good. Education and support given.  Reviewed medications with patient and discussed importance of compliance. The patient states compliance with his medications. Works with the pharm D for effective management of medications;  Discussed plans with patient for ongoing care management follow up and provided patient with direct contact information for care management team; Advised patient, providing education and rationale, to monitor blood pressure daily and record, calling PCP for findings outside established parameters;  Reviewed scheduled/upcoming provider appointments including: 12-18-2022 Advised patient to discuss changes in blood pressures or heart health with provider; Provided education on prescribed diet heart healthy/ADA diet. Education and support given. Review of heart healthy/ADA choices ;  Discussed complications of poorly controlled blood pressure such as heart disease, stroke, circulatory complications, vision complications, kidney impairment, sexual dysfunction;  Screening for signs and symptoms of depression related to chronic disease state;  Assessed social determinant of health barriers;   Symptom Management: Take medications as prescribed   Attend all scheduled provider appointments Call provider office for new concerns or questions  call the Suicide and Crisis Lifeline: 988 call the [place] National Suicide Prevention Lifeline: (972) 444-3533 or TTY: 251-738-3225 TTY (231)533-4433) to talk to a trained counselor call 1-800-273-TALK (toll free, 24 hour hotline) if experiencing a Mental Health or Behavioral Health Crisis  check blood pressure 3 times per week learn about high blood pressure keep a blood pressure log take blood pressure log to all doctor appointments call doctor for signs  and symptoms of high blood pressure develop an action plan for high blood pressure keep all doctor appointments take medications for blood pressure exactly as prescribed report new symptoms to your doctor  Follow Up Plan: Telephone follow up appointment with care management team member scheduled for: 12-17-2022 at 230 pm           Consent to Services:  Patient was given information about care management services, agreed to services, and gave verbal consent to participate.   Plan: Telephone follow up appointment with care management team member scheduled for: 12-17-2022 at 230 pm  Alto Denver RN, MSN, CCM RN Care Manager  [facility]  Ambulatory Care Management  Direct Number: [phone]

## 2022-11-16 NOTE — Patient Instructions (Signed)
Visit Information  Thank you for taking time to visit with me today. Please don't hesitate to contact me if I can be of assistance to you before our next scheduled telephone appointment.  Following are the goals we discussed today:   Goals Addressed             This Visit's Progress    RNCM Care Management  Expected Outcome:  Monitor, Self-Manage and Reduce Symptoms of Diabetes       Current Barriers:  Knowledge Deficits related to the need to check blood sugars and keep blood sugars in normal range Care Coordination needs related to the importance of monitoring blood sugars, eating healthy, maintaining good control of DM in a patient with DM Chronic Disease Management support and education needs related to effective management of DM Literacy barriers  Lab Results  Component Value Date   HGBA1C 9.7 (H) 10/09/2022  Saw endocrinologist recently A1C was 8.5%   Planned Interventions: Provided education to patient about basic DM disease process. The patient needs ongoing support and education with effective management of DM. The patients wife states currently he is eating better and checking his blood sugars and writing them down. The patient has been having up and down blood sugars. He has seen the new endocrinologist and will see again in October. Reviewed medications with patient and discussed importance of medication adherence. The patient is compliant with medications. He is taking his medications. He is working with the endocrinologist. Reviewed prescribed diet with patient heart healthy/ADA. The patient is doing better with ADA and options that will help with maintaining healthy blood sugars but having a hard time refraining from salt ; Counseled on importance of regular laboratory monitoring as prescribed. Review of goals of A1C and the patient is hoping his next A1C is much better;        Discussed plans with patient for ongoing care management follow up and provided patient with  direct contact information for care management team;      Provided patient with written educational materials related to hypo and hyperglycemia and importance of correct treatment. The patient denies any new falls.  Education on safety and monitoring for changes in the way he feels. Denies and lows. Highest he has seen is >300.        Reviewed scheduled/upcoming provider appointments including:12-18-2022 Advised patient, providing education and rationale, to check cbg before meals and at bedtime and when you have symptoms of low or high blood sugar and record. The endocrinologist had given the patient a continuous glucose reader but he kept knocking it off of his arm. He is trying this again but also doing finger sticks. His sugars today have been 138, 163, 130, and 154. Denies any acute highs or lows. call provider for findings outside established parameters;       Review of patient status, including review of consultants reports, relevant laboratory and other test results, and medications completed;       Advised patient to discuss changes in DM, questions, or concerns with provider;     Sees eye specialist in January. Is possibly going to have new surgery to help vision  Screening for signs and symptoms of depression related to chronic disease state;        Assessed social determinant of health barriers;         Symptom Management: Take medications as prescribed   Attend all scheduled provider appointments Call provider office for new concerns or questions  call the Suicide  and Crisis Lifeline: 988 call the [place] National Suicide Prevention Lifeline: 856-011-6448 or TTY: 2313662958 TTY 309-052-9168) to talk to a trained counselor call 1-800-273-TALK (toll free, 24 hour hotline) if experiencing a Mental Health or Behavioral Health Crisis  keep appointment with eye doctor check feet daily for cuts, sores or redness trim toenails straight across manage portion size wash and dry feet  carefully every day wear comfortable, cotton socks wear comfortable, well-fitting shoes  Follow Up Plan: Telephone follow up appointment with care management team member scheduled for:12-17-2022 at 230 pm       RNCM Care Management  Expected Outcome:  Monitor, Self-Manage and Reduce Symptoms of: Asthma       Current Barriers:  Knowledge Deficits related to triggers that can cause exacerbation of his asthma Chronic Disease Management support and education needs related to effective management of Asthma  Planned Interventions: Provided patient with basic written and verbal Asthma education on self care/management/and exacerbation prevention. Denies any issues with his breathing. The patient states his breathing is stable. Monitoring for changes in his breathing related to allergies. Denies any new concerns at this time. Advised patient to track and manage Asthma triggers. Review of the weather changes, being aware of people with infections, and monitoring for changes in his breathing;  Provided written and verbal instructions on pursed lip breathing and utilized returned demonstration as teach back; Provided instruction about proper use of medications used for management of Asthma including inhalers; Advised patient to self assesses Asthma action plan zone and make appointment with provider if in the yellow zone for 48 hours without improvement; Advised patient to engage in light exercise as tolerated 3-5 days a week to aid in the the management of Asthma; Provided education about and advised patient to utilize infection prevention strategies to reduce risk of respiratory infection. Education and support given; Discussed the importance of adequate rest and management of fatigue with Asthma; Screening for signs and symptoms of depression related to chronic disease state;  Assessed social determinant of health barriers;    Symptom Management: Take medications as prescribed   Attend all  scheduled provider appointments Call provider office for new concerns or questions  call the Suicide and Crisis Lifeline: 988 call the [place] National Suicide Prevention Lifeline: 289-775-0640 or TTY: 984-138-5832 TTY (352) 381-3416) to talk to a trained counselor call 1-800-273-TALK (toll free, 24 hour hotline) if experiencing a Mental Health or Behavioral Health Crisis   Follow Up Plan: Telephone follow up appointment with care management team member scheduled for: 12-17-2022 at 230 pm       RNCM Care Management Expected Outcome:  Monitor, Self-Manage, and Reduce Symptoms of Hypertension       Current Barriers:  Knowledge Deficits related to the importance for following a heart healthy/ADA diet and the need to restrict sodium in dietary habits  Chronic Disease Management support and education needs related to effective management of HTN  BP Readings from Last 3 Encounters:  11/12/22 120/75  10/31/22 (!) 116/57  10/11/22 128/73     Planned Interventions: Evaluation of current treatment plan related to hypertension self management and patient's adherence to plan as established by provider. The patients blood pressures are more stable now. The patient is trying to do better with his health and watching what he does. The patient saw the cardiologist this week and they want to do a stress test to make sure everything is good due to having some passing out spells. Education provided. Will continue to monitor for changes  and new needs.  Provided education to patient re: stroke prevention, s/s of heart attack and stroke; Reviewed prescribed diet heart healthy/ADA diet. The patient is more mindful of what he is eating and watching what he eats. He does like salt and sometimes uses too much salt. This is an ongoing obstacle but his wife states she is being diligent in trying to get him to understand the importance of not eating salt and this will help his blood pressures too.  She has removed the salt  shaker from the house and the patient is upset with her. She says that it is for his own good. Education and support given.  Reviewed medications with patient and discussed importance of compliance. The patient states compliance with his medications. Works with the pharm D for effective management of medications;  Discussed plans with patient for ongoing care management follow up and provided patient with direct contact information for care management team; Advised patient, providing education and rationale, to monitor blood pressure daily and record, calling PCP for findings outside established parameters;  Reviewed scheduled/upcoming provider appointments including: 12-18-2022 Advised patient to discuss changes in blood pressures or heart health with provider; Provided education on prescribed diet heart healthy/ADA diet. Education and support given. Review of heart healthy/ADA choices ;  Discussed complications of poorly controlled blood pressure such as heart disease, stroke, circulatory complications, vision complications, kidney impairment, sexual dysfunction;  Screening for signs and symptoms of depression related to chronic disease state;  Assessed social determinant of health barriers;   Symptom Management: Take medications as prescribed   Attend all scheduled provider appointments Call provider office for new concerns or questions  call the Suicide and Crisis Lifeline: 988 call the [place] National Suicide Prevention Lifeline: 9387807940 or TTY: 9722566945 TTY 6308570672) to talk to a trained counselor call 1-800-273-TALK (toll free, 24 hour hotline) if experiencing a Mental Health or Behavioral Health Crisis  check blood pressure 3 times per week learn about high blood pressure keep a blood pressure log take blood pressure log to all doctor appointments call doctor for signs and symptoms of high blood pressure develop an action plan for high blood pressure keep all doctor  appointments take medications for blood pressure exactly as prescribed report new symptoms to your doctor  Follow Up Plan: Telephone follow up appointment with care management team member scheduled for: 12-17-2022 at 230 pm           Our next appointment is by telephone on 12-17-2022 at 230 pm  Please call the care guide team at [phone] if you need to cancel or reschedule your appointment.   If you are experiencing a Mental Health or Behavioral Health Crisis or need someone to talk to, please call the Suicide and Crisis Lifeline: 988 call the [place] National Suicide Prevention Lifeline: 5742964831 or TTY: 973-794-6083 TTY (684)817-6865) to talk to a trained counselor call 1-800-273-TALK (toll free, 24 hour hotline)   The patient verbalized understanding of instructions, educational materials, and care plan provided today and DECLINED offer to receive copy of patient instructions, educational materials, and care plan.     Alto Denver RN, MSN, CCM RN Care Manager  [facility]  Ambulatory Care Management  Direct Number: [phone]

## 2022-11-16 NOTE — Patient Outreach (Signed)
  Care Management   Follow Up Note   11/16/2022 Name: [name]. MRN: [id number] DOB: 07-05-1960   Referred by: Marjie Skiff, NP Reason for referral : Care Management (RNCM: Follow up for Chronic Disease Management and Care Coordination Needs- attempt)   An unsuccessful telephone outreach was attempted today. The patient was referred to the case management team for assistance with care management and care coordination.   Follow Up Plan: A HIPPA compliant phone message was left for the patient providing contact information and requesting a return call.   Alto Denver RN, MSN, CCM RN Care Manager  [facility]  Ambulatory Care Management  Direct Number: [phone]

## 2022-11-20 ENCOUNTER — Ambulatory Visit: Payer: Self-pay | Admitting: *Deleted

## 2022-11-20 NOTE — Patient Instructions (Signed)
Visit Information  Thank you for taking time to visit with me today. Please don't hesitate to contact me if I can be of assistance to you.   Following are the goals we discussed today:  Please continue to follow up with the Department of Social Services regarding the status of your medicaid application    Our next appointment is by telephone on 12/11/22 at 1pm  Please call the care guide team at [phone] if you need to cancel or reschedule your appointment.   If you are experiencing a Mental Health or Behavioral Health Crisis or need someone to talk to, please call 911   Patient verbalizes understanding of instructions and care plan provided today and agrees to view in MyChart. Active MyChart status and patient understanding of how to access instructions and care plan via MyChart confirmed with patient.     Telephone follow up appointment with care management team member scheduled for: 12/11/22  Verna Czech, LCSW Clinical Social Worker  [phone]

## 2022-11-20 NOTE — Patient Outreach (Signed)
  Care Coordination   Follow Up Visit Note   11/20/2022 Name: [name]. MRN: [id number] DOB: [birth year]  [name]. is a 62 y.o. year old male who sees [name], Corrie Dandy T, NP for primary care. I spoke with  [name]. Patient's spouse and father by phone today.  What matters to the patients health and wellness today?  Patient currently working on [employer] application process.    Goals Addressed             This Visit's Progress    Assistance with Medicaid application status       Interventions Today    Flowsheet Row Most Recent Value  Chronic Disease   Chronic disease during today's visit Diabetes, Hypertension (HTN)  General Interventions   General Interventions Discussed/Reviewed General Interventions Reviewed  Level of Care Applications  Applications Medicaid  [confirmed with patient's father that status of patient's Medicaid applicaton remains pending- f/u call to the Department of Social Services-VM left to return call for status update Philippa Sicks [phone]]  Education Interventions   Applications Medicaid  [confirmed with patient's father that status of patient's Medicaid applicaton remains pending- f/u call to the Department of Social Services-VM left to return call for status update Philippa Sicks [phone]]  Mental Health Interventions   Mental Health Discussed/Reviewed Mental Health Discussed  [denies symptoms of depression, 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 Current coping explored, accepts self and  his condition now, willing to ask and accept help when needed]  Safety Interventions   Safety Discussed/Reviewed Safety Reviewed              SDOH assessments and interventions completed:  No     Care Coordination Interventions:  Yes, provided   Follow up plan: Follow up call scheduled for 9/34/34    Encounter Outcome:  Pt.  Visit Completed

## 2022-11-23 ENCOUNTER — Other Ambulatory Visit: Payer: Medicare Other

## 2022-11-26 ENCOUNTER — Other Ambulatory Visit: Payer: Medicare Other

## 2022-11-28 DIAGNOSIS — E1129 Type 2 diabetes mellitus with other diabetic kidney complication: Secondary | ICD-10-CM | POA: Diagnosis not present

## 2022-11-28 DIAGNOSIS — R809 Proteinuria, unspecified: Secondary | ICD-10-CM | POA: Diagnosis not present

## 2022-11-28 DIAGNOSIS — E785 Hyperlipidemia, unspecified: Secondary | ICD-10-CM | POA: Diagnosis not present

## 2022-11-28 DIAGNOSIS — I10 Essential (primary) hypertension: Secondary | ICD-10-CM | POA: Diagnosis not present

## 2022-11-29 ENCOUNTER — Ambulatory Visit: Payer: Medicare Other | Admitting: Cardiovascular Disease

## 2022-12-11 ENCOUNTER — Ambulatory Visit: Payer: Self-pay | Admitting: *Deleted

## 2022-12-11 NOTE — Patient Outreach (Signed)
Care Coordination   12/11/2022 Name: [name]. MRN: [id number] DOB: 1961-02-17   Care Coordination Outreach Attempts:  An unsuccessful telephone outreach was attempted today to offer the patient information about available care coordination services.  Follow Up Plan:  Additional outreach attempts will be made to offer the patient care coordination information and services.   Encounter Outcome:  No Answer   Care Coordination Interventions:  No, not indicated    Darrian Goodwill, LCSW [place]  [facility], [facility] Licensed Clinical Social Worker Care Coordinator  Direct Dial: [phone]

## 2022-12-12 ENCOUNTER — Telehealth: Payer: Self-pay | Admitting: Nurse Practitioner

## 2022-12-12 NOTE — Telephone Encounter (Signed)
Copied from CRM [id number]. Topic: General - Other >> Dec 11, 2022  4:30 PM Macon Large wrote: Reason for CRM: Pt requests that Chrystal return his call at [phone]

## 2022-12-17 ENCOUNTER — Other Ambulatory Visit: Payer: Medicare Other

## 2022-12-17 ENCOUNTER — Other Ambulatory Visit: Payer: Self-pay

## 2022-12-17 NOTE — Patient Outreach (Signed)
Care Management   Visit Note  12/17/2022 Name: [name]. MRN: [id number] DOB: July 04, 1960  Subjective: [name] Mindel. is a 62 y.o. year old male who is a primary care patient of Cannady, Dorie Rank, NP. The Care Management team was consulted for assistance.      Engaged with patient spoke with the family member (POA, [name], [place]).    Goals Addressed             This Visit's Progress    RNCM Care Management  Expected Outcome:  Monitor, Self-Manage and Reduce Symptoms of Diabetes       Current Barriers:  Knowledge Deficits related to the need to check blood sugars and keep blood sugars in normal range Care Coordination needs related to the importance of monitoring blood sugars, eating healthy, maintaining good control of DM in a patient with DM Chronic Disease Management support and education needs related to effective management of DM Literacy barriers  Lab Results  Component Value Date   HGBA1C 9.7 (H) 10/09/2022  Saw endocrinologist recently A1C was 8.5%   Planned Interventions: Provided education to patient about basic DM disease process. The patient needs ongoing support and education with effective management of DM. The patients wife states currently he is eating better and checking his blood sugars and writing them down. The patient has been having up and down blood sugars. He has seen the new endocrinologist and will see again in October. Reviewed medications with patient and discussed importance of medication adherence. The patient is compliant with medications. He is taking his medications. He is working with the endocrinologist. Reviewed prescribed diet with patient heart healthy/ADA. The patient is doing better with ADA and options that will help with maintaining healthy blood sugars but having a hard time refraining from salt ; Counseled on importance of regular laboratory monitoring as prescribed. Review of goals of A1C and the patient is hoping his  next A1C is much better;        Discussed plans with patient for ongoing care management follow up and provided patient with direct contact information for care management team;      Provided patient with written educational materials related to hypo and hyperglycemia and importance of correct treatment. The patient denies any new falls.  Education on safety and monitoring for changes in the way he feels. Denies and lows. Highest he has seen is >300.        Reviewed scheduled/upcoming provider appointments including:01-01-2023 at 120 pm, has a letter from his insurance and instructed him to take to his upcoming visit with the pcp.  Advised patient, providing education and rationale, to check cbg before meals and at bedtime and when you have symptoms of low or high blood sugar and record. The endocrinologist had given the patient a continuous glucose reader but he kept knocking it off of his arm. He is trying this again but also doing finger sticks. His sugars have been: 12-07-2022: 212, 174, 167; 12-15-2022: 203, 76, 211, 155; 12-16-2022: 176, 184. Denies any acute highs or lows. call provider for findings outside established parameters;       Review of patient status, including review of consultants reports, relevant laboratory and other test results, and medications completed;       Advised patient to discuss changes in DM, questions, or concerns with provider;     Sees eye specialist in January. Is possibly going to have new surgery to help vision. His vision is stable  Screening  for signs and symptoms of depression related to chronic disease state;        Assessed social determinant of health barriers;         Symptom Management: Take medications as prescribed   Attend all scheduled provider appointments Call provider office for new concerns or questions  call the Suicide and Crisis Lifeline: 988 call the [place] National Suicide Prevention Lifeline: 8450993531 or TTY: (956) 495-7122 TTY  [phone]) to talk to a trained counselor call 1-800-273-TALK (toll free, 24 hour hotline) if experiencing a Mental Health or Behavioral Health Crisis  keep appointment with eye doctor check feet daily for cuts, sores or redness trim toenails straight across manage portion size wash and dry feet carefully every day wear comfortable, cotton socks wear comfortable, well-fitting shoes  Follow Up Plan: Telephone follow up appointment with care management team member scheduled for:01-07-2023 at 145 pm       RNCM Care Management  Expected Outcome:  Monitor, Self-Manage and Reduce Symptoms of: Asthma       Current Barriers:  Knowledge Deficits related to triggers that can cause exacerbation of his asthma Chronic Disease Management support and education needs related to effective management of Asthma  Planned Interventions: Provided patient with basic written and verbal Asthma education on self care/management/and exacerbation prevention. Denies any issues with his breathing. The patient states his breathing is stable. Monitoring for changes in his breathing related to allergies. Denies any new concerns at this time. The patient is stable and doing well with his COPD. Advised patient to track and manage Asthma triggers. Review of the weather changes, being aware of people with infections, and monitoring for changes in his breathing;  Provided written and verbal instructions on pursed lip breathing and utilized returned demonstration as teach back; Provided instruction about proper use of medications used for management of Asthma including inhalers; Advised patient to self assesses Asthma action plan zone and make appointment with provider if in the yellow zone for 48 hours without improvement; Advised patient to engage in light exercise as tolerated 3-5 days a week to aid in the the management of Asthma; Provided education about and advised patient to utilize infection prevention strategies to  reduce risk of respiratory infection. Education and support given; Discussed the importance of adequate rest and management of fatigue with Asthma; Screening for signs and symptoms of depression related to chronic disease state;  Assessed social determinant of health barriers;    Symptom Management: Take medications as prescribed   Attend all scheduled provider appointments Call provider office for new concerns or questions  call the Suicide and Crisis Lifeline: 988 call the [place] National Suicide Prevention Lifeline: (559)599-2813 or TTY: 4147753762 TTY 519-760-9727) to talk to a trained counselor call 1-800-273-TALK (toll free, 24 hour hotline) if experiencing a Mental Health or Behavioral Health Crisis   Follow Up Plan: Telephone follow up appointment with care management team member scheduled for: 01-07-2023 at 145 pm       RNCM Care Management Expected Outcome:  Monitor, Self-Manage, and Reduce Symptoms of Hypertension       Current Barriers:  Knowledge Deficits related to the importance for following a heart healthy/ADA diet and the need to restrict sodium in dietary habits  Chronic Disease Management support and education needs related to effective management of HTN  BP Readings from Last 3 Encounters:  11/12/22 120/75  10/31/22 (!) 116/57  10/11/22 128/73     Planned Interventions: Evaluation of current treatment plan related to hypertension self management and  patient's adherence to plan as established by provider. The patients blood pressures are more stable now. The patient is trying to do better with his health and watching what he does. The patient saw the cardiologist and they want to do a stress test to make sure everything is good due to having some passing out spells. He will have his Echocardiogram on 02-05-2023.  Education provided. Will continue to monitor for changes and new needs.  Provided education to patient re: stroke prevention, s/s of heart attack and  stroke; Reviewed prescribed diet heart healthy/ADA diet. The patient is more mindful of what he is eating and watching what he eats. He does like salt and sometimes uses too much salt. This is an ongoing obstacle but his wife states she is being diligent in trying to get him to understand the importance of not eating salt and this will help his blood pressures too.  She has removed the salt shaker from the house and the patient is upset with her. She says that it is for his own good. Education and support given.  Reviewed medications with patient and discussed importance of compliance. The patient states compliance with his medications. Works with the pharm D for effective management of medications;  Discussed plans with patient for ongoing care management follow up and provided patient with direct contact information for care management team; Advised patient, providing education and rationale, to monitor blood pressure daily and record, calling PCP for findings outside established parameters;  Reviewed scheduled/upcoming provider appointments including: 01-01-2023 at 120 pm Advised patient to discuss changes in blood pressures or heart health with provider; Provided education on prescribed diet heart healthy/ADA diet. Education and support given. Review of heart healthy/ADA choices ;  Discussed complications of poorly controlled blood pressure such as heart disease, stroke, circulatory complications, vision complications, kidney impairment, sexual dysfunction;  Screening for signs and symptoms of depression related to chronic disease state;  Assessed social determinant of health barriers;   Symptom Management: Take medications as prescribed   Attend all scheduled provider appointments Call provider office for new concerns or questions  call the Suicide and Crisis Lifeline: 988 call the [place] National Suicide Prevention Lifeline: (213) 657-8521 or TTY: 717-575-3580 TTY 609-266-3702) to talk to a  trained counselor call 1-800-273-TALK (toll free, 24 hour hotline) if experiencing a Mental Health or Behavioral Health Crisis  check blood pressure 3 times per week learn about high blood pressure keep a blood pressure log take blood pressure log to all doctor appointments call doctor for signs and symptoms of high blood pressure develop an action plan for high blood pressure keep all doctor appointments take medications for blood pressure exactly as prescribed report new symptoms to your doctor  Follow Up Plan: Telephone follow up appointment with care management team member scheduled for: 01-07-2023 at 145 pm           Consent to Services:  Patient was given information about care management services, agreed to services, and gave verbal consent to participate.   Plan: Telephone follow up appointment with care management team member scheduled for: 01-07-2023 at 145 pm  Alto Denver RN, MSN, CCM RN Care Manager  [facility]  Ambulatory Care Management  Direct Number: [phone]

## 2022-12-17 NOTE — Patient Instructions (Signed)
Visit Information  Thank you for taking time to visit with me today. Please don't hesitate to contact me if I can be of assistance to you before our next scheduled telephone appointment.  Following are the goals we discussed today:   Goals Addressed             This Visit's Progress    RNCM Care Management  Expected Outcome:  Monitor, Self-Manage and Reduce Symptoms of Diabetes       Current Barriers:  Knowledge Deficits related to the need to check blood sugars and keep blood sugars in normal range Care Coordination needs related to the importance of monitoring blood sugars, eating healthy, maintaining good control of DM in a patient with DM Chronic Disease Management support and education needs related to effective management of DM Literacy barriers  Lab Results  Component Value Date   HGBA1C 9.7 (H) 10/09/2022  Saw endocrinologist recently A1C was 8.5%   Planned Interventions: Provided education to patient about basic DM disease process. The patient needs ongoing support and education with effective management of DM. The patients wife states currently he is eating better and checking his blood sugars and writing them down. The patient has been having up and down blood sugars. He has seen the new endocrinologist and will see again in October. Reviewed medications with patient and discussed importance of medication adherence. The patient is compliant with medications. He is taking his medications. He is working with the endocrinologist. Reviewed prescribed diet with patient heart healthy/ADA. The patient is doing better with ADA and options that will help with maintaining healthy blood sugars but having a hard time refraining from salt ; Counseled on importance of regular laboratory monitoring as prescribed. Review of goals of A1C and the patient is hoping his next A1C is much better;        Discussed plans with patient for ongoing care management follow up and provided patient with  direct contact information for care management team;      Provided patient with written educational materials related to hypo and hyperglycemia and importance of correct treatment. The patient denies any new falls.  Education on safety and monitoring for changes in the way he feels. Denies and lows. Highest he has seen is >300.        Reviewed scheduled/upcoming provider appointments including:01-01-2023 at 120 pm, has a letter from his insurance and instructed him to take to his upcoming visit with the pcp.  Advised patient, providing education and rationale, to check cbg before meals and at bedtime and when you have symptoms of low or high blood sugar and record. The endocrinologist had given the patient a continuous glucose reader but he kept knocking it off of his arm. He is trying this again but also doing finger sticks. His sugars have been: 12-07-2022: 212, 174, 167; 12-15-2022: 203, 76, 211, 155; 12-16-2022: 176, 184. Denies any acute highs or lows. call provider for findings outside established parameters;       Review of patient status, including review of consultants reports, relevant laboratory and other test results, and medications completed;       Advised patient to discuss changes in DM, questions, or concerns with provider;     Sees eye specialist in January. Is possibly going to have new surgery to help vision. His vision is stable  Screening for signs and symptoms of depression related to chronic disease state;        Assessed social determinant of health barriers;  Symptom Management: Take medications as prescribed   Attend all scheduled provider appointments Call provider office for new concerns or questions  call the Suicide and Crisis Lifeline: 988 call the [place] National Suicide Prevention Lifeline: (502) 489-9988 or TTY: 8541118754 TTY 450-416-6200) to talk to a trained counselor call 1-800-273-TALK (toll free, 24 hour hotline) if experiencing a Mental Health or  Behavioral Health Crisis  keep appointment with eye doctor check feet daily for cuts, sores or redness trim toenails straight across manage portion size wash and dry feet carefully every day wear comfortable, cotton socks wear comfortable, well-fitting shoes  Follow Up Plan: Telephone follow up appointment with care management team member scheduled for:01-07-2023 at 145 pm       RNCM Care Management  Expected Outcome:  Monitor, Self-Manage and Reduce Symptoms of: Asthma       Current Barriers:  Knowledge Deficits related to triggers that can cause exacerbation of his asthma Chronic Disease Management support and education needs related to effective management of Asthma  Planned Interventions: Provided patient with basic written and verbal Asthma education on self care/management/and exacerbation prevention. Denies any issues with his breathing. The patient states his breathing is stable. Monitoring for changes in his breathing related to allergies. Denies any new concerns at this time. The patient is stable and doing well with his COPD. Advised patient to track and manage Asthma triggers. Review of the weather changes, being aware of people with infections, and monitoring for changes in his breathing;  Provided written and verbal instructions on pursed lip breathing and utilized returned demonstration as teach back; Provided instruction about proper use of medications used for management of Asthma including inhalers; Advised patient to self assesses Asthma action plan zone and make appointment with provider if in the yellow zone for 48 hours without improvement; Advised patient to engage in light exercise as tolerated 3-5 days a week to aid in the the management of Asthma; Provided education about and advised patient to utilize infection prevention strategies to reduce risk of respiratory infection. Education and support given; Discussed the importance of adequate rest and management of  fatigue with Asthma; Screening for signs and symptoms of depression related to chronic disease state;  Assessed social determinant of health barriers;    Symptom Management: Take medications as prescribed   Attend all scheduled provider appointments Call provider office for new concerns or questions  call the Suicide and Crisis Lifeline: 988 call the [place] National Suicide Prevention Lifeline: 864-168-8601 or TTY: (914)598-6499 TTY 747 443 5874) to talk to a trained counselor call 1-800-273-TALK (toll free, 24 hour hotline) if experiencing a Mental Health or Behavioral Health Crisis   Follow Up Plan: Telephone follow up appointment with care management team member scheduled for: 01-07-2023 at 145 pm       RNCM Care Management Expected Outcome:  Monitor, Self-Manage, and Reduce Symptoms of Hypertension       Current Barriers:  Knowledge Deficits related to the importance for following a heart healthy/ADA diet and the need to restrict sodium in dietary habits  Chronic Disease Management support and education needs related to effective management of HTN  BP Readings from Last 3 Encounters:  11/12/22 120/75  10/31/22 (!) 116/57  10/11/22 128/73     Planned Interventions: Evaluation of current treatment plan related to hypertension self management and patient's adherence to plan as established by provider. The patients blood pressures are more stable now. The patient is trying to do better with his health and watching what he does. The  patient saw the cardiologist and they want to do a stress test to make sure everything is good due to having some passing out spells. He will have his Echocardiogram on 02-05-2023.  Education provided. Will continue to monitor for changes and new needs.  Provided education to patient re: stroke prevention, s/s of heart attack and stroke; Reviewed prescribed diet heart healthy/ADA diet. The patient is more mindful of what he is eating and watching what he  eats. He does like salt and sometimes uses too much salt. This is an ongoing obstacle but his wife states she is being diligent in trying to get him to understand the importance of not eating salt and this will help his blood pressures too.  She has removed the salt shaker from the house and the patient is upset with her. She says that it is for his own good. Education and support given.  Reviewed medications with patient and discussed importance of compliance. The patient states compliance with his medications. Works with the pharm D for effective management of medications;  Discussed plans with patient for ongoing care management follow up and provided patient with direct contact information for care management team; Advised patient, providing education and rationale, to monitor blood pressure daily and record, calling PCP for findings outside established parameters;  Reviewed scheduled/upcoming provider appointments including: 01-01-2023 at 120 pm Advised patient to discuss changes in blood pressures or heart health with provider; Provided education on prescribed diet heart healthy/ADA diet. Education and support given. Review of heart healthy/ADA choices ;  Discussed complications of poorly controlled blood pressure such as heart disease, stroke, circulatory complications, vision complications, kidney impairment, sexual dysfunction;  Screening for signs and symptoms of depression related to chronic disease state;  Assessed social determinant of health barriers;   Symptom Management: Take medications as prescribed   Attend all scheduled provider appointments Call provider office for new concerns or questions  call the Suicide and Crisis Lifeline: 988 call the [place] National Suicide Prevention Lifeline: 312-825-9645 or TTY: 613-151-7011 TTY (249)294-6894) to talk to a trained counselor call 1-800-273-TALK (toll free, 24 hour hotline) if experiencing a Mental Health or Behavioral Health Crisis   check blood pressure 3 times per week learn about high blood pressure keep a blood pressure log take blood pressure log to all doctor appointments call doctor for signs and symptoms of high blood pressure develop an action plan for high blood pressure keep all doctor appointments take medications for blood pressure exactly as prescribed report new symptoms to your doctor  Follow Up Plan: Telephone follow up appointment with care management team member scheduled for: 01-07-2023 at 145 pm           Our next appointment is by telephone on 01-07-2023 at 145 pm   Please call the care guide team at [phone] if you need to cancel or reschedule your appointment.   If you are experiencing a Mental Health or Behavioral Health Crisis or need someone to talk to, please call the Suicide and Crisis Lifeline: 988 call the [place] National Suicide Prevention Lifeline: 574-215-5480 or TTY: [phone] TTY 9783125467) to talk to a trained counselor call 1-800-273-TALK (toll free, 24 hour hotline)   The patient verbalized understanding of instructions, educational materials, and care plan provided today and DECLINED offer to receive copy of patient instructions, educational materials, and care plan.   Telephone follow up appointment with care management team member scheduled for: 01-07-2023 at 145 pm  Alto Denver RN, MSN, CCM RN Care  Manager  [facility]  Ambulatory Care Management  Direct Number: [phone]

## 2022-12-19 ENCOUNTER — Other Ambulatory Visit: Payer: Self-pay

## 2022-12-24 ENCOUNTER — Ambulatory Visit: Payer: Medicare Other | Admitting: Gastroenterology

## 2022-12-24 ENCOUNTER — Telehealth: Payer: Self-pay | Admitting: Gastroenterology

## 2022-12-24 NOTE — Telephone Encounter (Signed)
Patient has personal matters at this time and need to reschedule.

## 2022-12-27 ENCOUNTER — Other Ambulatory Visit: Payer: Medicare Other

## 2022-12-30 NOTE — Patient Instructions (Signed)
Diabetes Mellitus and Skin Care Diabetes, also called diabetes mellitus, can lead to skin problems. If blood sugar (glucose) is not well controlled, it can cause problems over time. These problems include: Damage to nerves. This can affect your ability to feel wounds. This means you may not notice small skin injuries that could lead to bigger problems. This can also decrease the amount that you sweat, causing dry skin. Damage to blood vessels. The lack of blood flow can cause skin to break down. It can also slow healing time, which can lead to infections. Areas of skin that become thick or discolored. Common skin conditions There are certain skin conditions that often affect people with diabetes. These include: Dry skin. Thin skin. The skin on the feet may get thinner, break more easily, and heal more slowly than normal. Skin infections from bacteria. These include: Styes. These are infections near the eyelid. Boils. These are bumps filled with pus. Infected hair follicles. Infections of the skin around the nails. Fungal skin infections. These are most common in areas where skin rubs together, such as in the armpits or under the breasts. Common skin changes Diabetes can also cause the skin to change. You may develop: Dark, velvety markings on your skin. These may appear on your face, neck, armpits, inner thighs, and groin. Red, raised, scar-like tissue that may itch, feel painful, or become a wound. Blisters on your feet, toes, hands, or fingers. Thick, wax-like areas of skin. In most cases, these occur on the hands, forehead, or toes. Brown or red, ring-shaped or half-ring-shaped patches of skin on the ears or fingers. Pea-shaped, yellow bumps that may be itchy and have a red ring around them. This may affect your arms, feet, buttocks, and the top of your hands. Round, discolored patches of tan skin that do not hurt or itch. These may look like age spots. Supplies needed: Mild soap or  gentle skin cleanser. Lotion. How to care for dry, itchy skin Frequent high glucose levels can cause skin to become itchy. Poor blood circulation and skin infections can make dry skin worse. If you have dry, itchy skin: Avoid very hot showers and baths. Use mild soap and gentle skin cleansers. Do not use soap that is perfumed, harsh, or that dries your skin. Moisturizing soaps may help. Put on moisturizing lotion as soon as you finish bathing. Do not scratch dry skin. Scratching can expose skin to infection. If you have a rash or if your skin is very itchy, contact your health care provider. Skin that is red or covered in a rash may be a sign of an allergic reaction. Very itchy skin may mean that you need help to manage your diabetes better. You may also need treatment for an infection. General tips Most skin problems can be prevented or treated easily if caught early. Talk with your health care provider if you have any concerns. General tips include: Check your skin every day for cuts, bruises, redness, blisters, or sores, especially on your feet. If you cannot see the bottom of your feet, use a mirror or ask someone for help. Tell your health care provider if you have any of these injuries and if they are healing slowly. Keep your skin clean and dry. Do not use hot water. Moisturize your skin to prevent chapping. Keep your blood glucose levels within target range. Follow these instructions at home:  Take over-the-counter and prescription medicines only as told by your health care provider. This includes all diabetes medicines   you are taking. Schedule a foot exam with your health care provider once a year. During the exam, the structure and skin of your feet will be checked for problems. Make sure that your health care provider does a visual foot exam at every visit. If you get a skin injury, such as a cut, blister, or sore, check the area every day for signs of infection. Check for: Redness,  swelling, or pain. Fluid or blood. Warmth. Pus or a bad smell. Do not use any products that contain nicotine or tobacco. These products include cigarettes, chewing tobacco, and vaping devices, such as e-cigarettes. If you need help quitting, ask your health care provider. Where to find more information American Diabetes Association: diabetes.org Association of Diabetes Care & Education Specialists: diabeteseducator.org Contact a health care provider if: You get a cut or sore, especially on your feet. You have signs of infection after a skin injury. You have itchy skin that turns red or develops a rash. You have discolored areas of skin. You have places on your skin that change. They may thicken or appear shiny. This information is not intended to replace advice given to you by your health care provider. Make sure you discuss any questions you have with your health care provider. Document Revised: 09/06/2021 Document Reviewed: 09/06/2021 Elsevier Patient Education  2024 [employer]  

## 2023-01-01 ENCOUNTER — Ambulatory Visit (INDEPENDENT_AMBULATORY_CARE_PROVIDER_SITE_OTHER): Payer: Medicare Other | Admitting: Nurse Practitioner

## 2023-01-01 ENCOUNTER — Encounter: Payer: Self-pay | Admitting: Nurse Practitioner

## 2023-01-01 VITALS — BP 136/80 | HR 59 | Temp 98.5°F | Ht 64.0 in | Wt 169.2 lb

## 2023-01-01 DIAGNOSIS — E785 Hyperlipidemia, unspecified: Secondary | ICD-10-CM

## 2023-01-01 DIAGNOSIS — I209 Angina pectoris, unspecified: Secondary | ICD-10-CM

## 2023-01-01 DIAGNOSIS — Z8673 Personal history of transient ischemic attack (TIA), and cerebral infarction without residual deficits: Secondary | ICD-10-CM | POA: Diagnosis not present

## 2023-01-01 DIAGNOSIS — E1129 Type 2 diabetes mellitus with other diabetic kidney complication: Secondary | ICD-10-CM | POA: Diagnosis not present

## 2023-01-01 DIAGNOSIS — Z23 Encounter for immunization: Secondary | ICD-10-CM | POA: Diagnosis not present

## 2023-01-01 DIAGNOSIS — J4540 Moderate persistent asthma, uncomplicated: Secondary | ICD-10-CM

## 2023-01-01 DIAGNOSIS — E1165 Type 2 diabetes mellitus with hyperglycemia: Secondary | ICD-10-CM | POA: Diagnosis not present

## 2023-01-01 DIAGNOSIS — E701 Other hyperphenylalaninemias: Secondary | ICD-10-CM | POA: Diagnosis not present

## 2023-01-01 DIAGNOSIS — E1169 Type 2 diabetes mellitus with other specified complication: Secondary | ICD-10-CM | POA: Diagnosis not present

## 2023-01-01 DIAGNOSIS — E6609 Other obesity due to excess calories: Secondary | ICD-10-CM

## 2023-01-01 DIAGNOSIS — L03116 Cellulitis of left lower limb: Secondary | ICD-10-CM

## 2023-01-01 DIAGNOSIS — R011 Cardiac murmur, unspecified: Secondary | ICD-10-CM | POA: Diagnosis not present

## 2023-01-01 DIAGNOSIS — I152 Hypertension secondary to endocrine disorders: Secondary | ICD-10-CM

## 2023-01-01 DIAGNOSIS — R809 Proteinuria, unspecified: Secondary | ICD-10-CM | POA: Diagnosis not present

## 2023-01-01 DIAGNOSIS — K760 Fatty (change of) liver, not elsewhere classified: Secondary | ICD-10-CM

## 2023-01-01 DIAGNOSIS — E1159 Type 2 diabetes mellitus with other circulatory complications: Secondary | ICD-10-CM | POA: Diagnosis not present

## 2023-01-01 MED ORDER — MUPIROCIN 2 % EX OINT: 1.0000 | TOPICAL_OINTMENT | Freq: Two times a day (BID) | CUTANEOUS | 3 refills | Status: DC

## 2023-01-01 MED ORDER — DOXYCYCLINE HYCLATE 100 MG PO TABS: 100.0000 mg | ORAL_TABLET | Freq: Two times a day (BID) | ORAL | 0 refills | Status: AC

## 2023-01-01 NOTE — Assessment & Plan Note (Signed)
Chronic, followed by cardiology and has upcoming testing scheduled to further assess.  Continue NTG as needed and collaboration with cardiology.  Denies CP today.

## 2023-01-01 NOTE — Assessment & Plan Note (Signed)
Mental delays present, continue to work with CCM team which offers benefit.

## 2023-01-01 NOTE — Assessment & Plan Note (Signed)
Chronic, ongoing.  Continue Lipitor, Vascepa, and Fenofibrate. Lipid panel today.  Adjust doses as needed, if LDL >70.

## 2023-01-01 NOTE — Assessment & Plan Note (Signed)
Chronic, ongoing.  FEV1 70% on check in 2021.  Tolerating Breo, to continue to use daily.  Recommend using Albuterol as needed only if episodes of SOB present. Return in 3 months. Repeat spirometry next visit.

## 2023-01-01 NOTE — Assessment & Plan Note (Signed)
Continue collaboration with neurology and continue statin + ASA/Plavix.  Discussed goals BP <130/90, A1C < 6.5%, and LDL <55.  He is poorly controlled with diabetes, much related to knowledge base and poor diet.  New referral to endo placed.

## 2023-01-01 NOTE — Progress Notes (Signed)
BP 136/80   Pulse (!) 59   Temp 98.5 F (36.9 C) (Oral)   Ht 5\' 4"  (1.626 m)   Wt 169 lb 3.2 oz (76.7 kg)   SpO2 98%   BMI 29.04 kg/m    Subjective:    Patient ID: Alex Dam., male    DOB: 23-Mar-1960, 62 y.o.   MRN: [id number]  HPI: Alex Peiffer. is a 62 y.o. male  Chief Complaint  Patient presents with   Diabetes   Hypertension   Hyperlipidemia   Asthma   DIABETES & PHENYLKETONURIA A1c 9.7% in July. Had initial endocrinology visit with Gavin Potters on 10/29/22 with no changes to medications, is to return on 01/15/23 with labs then but reports he can not return as they are not in network -- needs new referral elsewhere. Endorses ongoing poor diet at times.  Taking long acting insulin and sliding scale, however on review of note from previous endocrinologist (Dr. Kerrie Pleasure) there was discussion of insulin pump due to patient's ongoing elevated A1c and inconsistent administration of medication + poor diet.  He reported he had the pump, but they could not get a hold of person to place at that time.  Attempted to get him into endo at [facility] or [place] in past, but due to ankle monitor [place] his and wife's report.  Continues [place], and [place] with sliding scale. Has had phenylketonuria since childhood and is not consistent with diet.    Has tried Metformin in past x 2 both times AKI presented, history of pancreatitis and can not take GLP1.  Hypoglycemic episodes:no Polydipsia/polyuria: no Visual disturbance: no Chest pain: no Paresthesias: no Glucose Monitoring: yes  Accucheck frequency: TID -- using glucometer 180 to 249 on review of his occasional checks  Fasting glucose:   Post prandial:   Evening:  Before meals: Taking Insulin?: yes  Long acting insulin: Tresiba 40 units BID  Short acting insulin: 42 units before meals Blood Pressure Monitoring: not checking Retinal Examination: Up to Date --  [place] Foot Exam: Up to Date Pneumovax: Up to Date Influenza: Up to Date Aspirin: yes   HYPERTENSION / HYPERLIPIDEMIA Last saw Dr. Welton Flakes on 11/12/22 was to return in 4 weeks for echo/stress test, but do not see that he did return -- reports he is scheduled.  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 Had last visit with Dr. Malvin Johns on 11/09/19 (neurology) -- he is to continue Plavix and ASA per their note. Saw vascular on 10/08/22, they did testing "on my neck" he reports and they are going to "keep eye on it"  Current medications Lisinopril, Rosuvastatin, Fenofibrate, Vascepa, NTG.   Satisfied with current treatment? yes Duration of hypertension: chronic BP monitoring frequency: not checking BP range:  BP medication side effects: no Duration of hyperlipidemia: chronic Cholesterol medication side effects: no Cholesterol supplements: none Medication compliance: good compliance Aspirin: yes Recent stressors: no Recurrent headaches: no Visual changes: no Palpitations: no Dyspnea: no Chest pain: occasional, is going for cardiac testing upcoming and takes NTG as needed Lower extremity edema: no Dizzy/lightheaded: no  PROTEINURIA Had visit with nephrology last on 11/28/22. Proteinuria and CKD Stage II.  CKD status: stable Medications renally dose: yes Previous renal evaluation: yes Pneumovax:  Up to Date Influenza Vaccine:  Up to Date   ASTHMA Using Breo daily and Singulair.  Reports Breo works well.  Asthma status: stable Satisfied with current treatment?: yes Albuterol/rescue inhaler frequency: none Dyspnea frequency:  occasional with activity and weather changes Wheezing frequency: none Cough frequency: none Nocturnal symptom frequency: none Limitation of activity: no Current upper respiratory symptoms: no Aerochamber/spacer use: no Visits to ER or Urgent Care in past year: no Pneumovax: Up to Date Influenza: Up to Date  SKIN INFECTION Has infection to left lower leg -- last  visit he reported bed bugs in home.  Noticed multiple bites present, he picks at these often - on arms and legs.  He is trying to get rid of ed bugs in home.  States they are not as bad at present -- they need to get rid of couch and bedding.  They took carpet out and put wood floor down. Duration: weeks -- two weeks Location: left lower leg History of trauma in area: no Pain: yes Quality: yes Severity: mild Redness: yes Swelling: yes Oozing: no Pus: no Fevers: no Nausea/vomiting: no Status: stable Treatments attempted:none  Tetanus: UTD  Relevant past medical, surgical, family and social history reviewed and updated as indicated. Interim medical history since our last visit reviewed. Allergies and medications reviewed and updated.  Review of Systems  Constitutional:  Negative for activity change, diaphoresis, fatigue and fever.  Respiratory:  Negative for cough, chest tightness, shortness of breath and wheezing.   Cardiovascular:  Negative for chest pain, palpitations and leg swelling.  Gastrointestinal: Negative.   Skin:  Positive for wound.  Neurological:  Negative for weakness.  Psychiatric/Behavioral: Negative.     Per HPI unless specifically indicated above     Objective:    BP 136/80   Pulse (!) 59   Temp 98.5 F (36.9 C) (Oral)   Ht 5\' 4"  (1.626 m)   Wt 169 lb 3.2 oz (76.7 kg)   SpO2 98%   BMI 29.04 kg/m   Wt Readings from Last 3 Encounters:  01/01/23 169 lb 3.2 oz (76.7 kg)  11/12/22 168 lb 9.6 oz (76.5 kg)  10/31/22 173 lb 12.8 oz (78.8 kg)    Physical Exam Vitals and nursing note reviewed.  Constitutional:      General: He is awake. He is not in acute distress.    Appearance: Normal appearance. He is well-developed. He is obese. He is not ill-appearing.  HENT:     Head: Normocephalic and atraumatic.     Right Ear: Hearing and external ear normal. No drainage.     Left Ear: Hearing and external ear normal. No drainage.  Eyes:     General: Lids are  normal.        Right eye: No discharge.        Left eye: No discharge.     Conjunctiva/sclera: Conjunctivae normal.     Pupils: Pupils are equal, round, and reactive to light.  Neck:     Thyroid: No thyromegaly.     Vascular: Carotid bruit (R>L) present.  Cardiovascular:     Rate and Rhythm: Regular rhythm. Bradycardia present.     Heart sounds: S1 normal and S2 normal. Murmur heard.     Systolic murmur is present with a grade of 2/6.     No gallop.     Comments: Systolic murmur noted best at left sternal border, soft. Pulmonary:     Effort: Pulmonary effort is normal. No accessory muscle usage or respiratory distress.     Breath sounds: Normal breath sounds.  Abdominal:     General: Bowel sounds are normal. There is no distension.     Palpations: Abdomen is soft.  Tenderness: There is no abdominal tenderness.  Musculoskeletal:     Cervical back: Normal range of motion and neck supple.     Right lower leg: No edema.     Left lower leg: No edema.  Lymphadenopathy:     Cervical: No cervical adenopathy.  Skin:    General: Skin is warm and dry.     Capillary Refill: Capillary refill takes less than 2 seconds.       Neurological:     Mental Status: He is alert and oriented to person, place, and time.     Deep Tendon Reflexes: Reflexes are normal and symmetric.  Psychiatric:        Attention and Perception: Attention normal.        Mood and Affect: Mood normal.        Speech: Speech normal.        Behavior: Behavior normal. Behavior is cooperative.    Results for orders placed or performed in visit on 10/31/22  CBC with Differential/Platelet  Result Value Ref Range   WBC 4.5 3.4 - 10.8 x10E3/uL   RBC 4.10 (L) 4.14 - 5.80 x10E6/uL   Hemoglobin 12.8 (L) 13.0 - 17.7 g/dL   Hematocrit 95.2 84.1 - 51.0 %   MCV 97 79 - 97 fL   MCH 31.2 26.6 - 33.0 pg   MCHC 32.3 31.5 - 35.7 g/dL   RDW 32.4 40.1 - 02.7 %   Platelets 137 (L) 150 - 450 x10E3/uL   Neutrophils 65 Not Estab. %    Lymphs 23 Not Estab. %   Monocytes 8 Not Estab. %   Eos 4 Not Estab. %   Basos 0 Not Estab. %   Neutrophils Absolute 3.0 1.4 - 7.0 x10E3/uL   Lymphocytes Absolute 1.0 0.7 - 3.1 x10E3/uL   Monocytes Absolute 0.4 0.1 - 0.9 x10E3/uL   EOS (ABSOLUTE) 0.2 0.0 - 0.4 x10E3/uL   Basophils Absolute 0.0 0.0 - 0.2 x10E3/uL   Immature Granulocytes 0 Not Estab. %   Immature Grans (Abs) 0.0 0.0 - 0.1 x10E3/uL  Basic metabolic panel  Result Value Ref Range   Glucose 335 (H) 70 - 99 mg/dL   BUN 14 6 - 24 mg/dL   Creatinine, Ser 2.53 0.76 - 1.27 mg/dL   eGFR 87 >66 YQ/IHK/7.42   BUN/Creatinine Ratio 14 9 - 20   Sodium 140 134 - 144 mmol/L   Potassium 4.3 3.5 - 5.2 mmol/L   Chloride 104 96 - 106 mmol/L   CO2 22 20 - 29 mmol/L   Calcium 9.1 8.7 - 10.2 mg/dL      Assessment & Plan:   Problem List Items Addressed This Visit       Cardiovascular and Mediastinum   Angina pectoris (HCC)    Chronic, followed by cardiology and has upcoming testing scheduled to further assess.  Continue NTG as needed and collaboration with cardiology.  Denies CP today.      Hypertension associated with diabetes (HCC)    Chronic, stable.  BP close to goal in office today.  Will continue Lisinopril 10 MG dosing, further adjust next visit if remains on mildly elevated, had reduced in past due to occasional dizziness and low readings.  Labs: up to date.  Urine ALB 150 January 2024.  Recommend they check his BP at home at least daily and document + focus on DASH diet.  Continue to collaborate with cardiology.  Return to office in 3 months.      Relevant Orders  Ambulatory referral to Endocrinology     Respiratory   Asthma    Chronic, ongoing.  FEV1 70% on check in 2021.  Tolerating Breo, to continue to use daily.  Recommend using Albuterol as needed only if episodes of SOB present. Return in 3 months. Repeat spirometry next visit.        Digestive   NAFLD (nonalcoholic fatty liver disease)    Ongoing, recent CT  scan reassuring.  Continue collaboration with GI provider as needed.  Labs up to date.        Endocrine   Hyperlipidemia associated with type 2 diabetes mellitus (HCC) - Primary    Chronic, ongoing.  Continue Lipitor, Vascepa, and Fenofibrate. Lipid panel today.  Adjust doses as needed, if LDL >70.      Relevant Orders   Lipid Panel w/o Chol/HDL Ratio   Ambulatory referral to Endocrinology   Type 2 diabetes mellitus with proteinuria (HCC)    Chronic, ongoing.  A1c 9.7% in July, slight trend down from 10.5%.  Recheck A1c today.  He had initial visit with [facility] endo recently and scheduled to return on 01/15/23, however needs new endo referral as insurance does not cover [facility].  New referral placed. Urine ALB 150 January 2024, continue Lisinopril. - Continue Jardiance, Januvia, + insulin as ordered by previous endo.   - Poor tolerance to Metformin in past with AKI presenting x 2 trials and allergic to Sulfa. Would avoid GLP at this time due to patient history of pancreatitis x 3, concern this would flare. Educated patient at length on effect of diabetes from head to toe and increased risk for recurrent CVA due to poor control.   - Recommend they check his BS TID -- he would benefit from [facility] will work on this with CCM team in future, however he prefers not to get at this time.  Has been working with pharmacist, SW, and nurse case manager. - Goal A1C <6.5% due to CVA history -- which reiterated at length with patient today.  Discussed at length what an A1c at his current level could cause. - Foot and eye exam up to date. - Vaccinations up to date      Relevant Orders   HgB A1c   Ambulatory referral to Endocrinology   Uncontrolled type 2 diabetes mellitus with hyperglycemia (HCC)    Chronic, ongoing.  A1c 9.7% in July, slight trend down from 10.5%.  Recheck A1c today.  He had initial visit with [facility] endo recently and scheduled to return on 01/15/23, however needs new  endo referral as insurance does not cover [facility].  New referral placed. Urine ALB 150 January 2024, continue Lisinopril. - Continue Jardiance, Januvia, + insulin as ordered by previous endo.   - Poor tolerance to Metformin in past with AKI presenting x 2 trials and allergic to Sulfa. Would avoid GLP at this time due to patient history of pancreatitis x 3, concern this would flare. Educated patient at length on effect of diabetes from head to toe and increased risk for recurrent CVA due to poor control.   - Recommend they check his BS TID -- he would benefit from [facility] will work on this with CCM team in future, however he prefers not to get at this time.  Has been working with pharmacist, SW, and nurse case manager. - Goal A1C <6.5% due to CVA history -- which reiterated at length with patient today.  Discussed at length what an A1c at his current  level could cause. - Foot and eye exam up to date.      Relevant Orders   HgB A1c   Ambulatory referral to Endocrinology     Other   BMI 29.0-29.9,adult    BMI 29.04, maintaining some weight loss.  Recommended eating smaller high protein, low fat meals more frequently and exercising 30 mins a day 5 times a week with a goal of 10-15lb weight loss in the next 3 months. Patient voiced their understanding and motivation to adhere to these recommendations.       Cellulitis    Recurrent cases at time to various locations due to picking at bed bug bites.  Instructed him not to pick at areas due to higher risk for infection with diabetes.  They are working on clearing bed bugs from home.  Start Doxycyline 100 MG BID for 7 days and then recheck.  Mupirocin to area as needed.  He reports he will try to pick up abx, but unsure he can afford today -- states if he can not get it and wound becomes worse he will "just go to ER again".  Highly recommend he attempt to obtain oral abx.  Recommend he apply warm compresses to area about 4 times daily.   Return in one week.      Heart murmur    Grade 2/6 on auscultation, continue collaboration with cardiology.        History of CVA (cerebrovascular accident)    Continue collaboration with neurology and continue statin + ASA/Plavix.  Discussed goals BP <130/90, A1C < 6.5%, and LDL <55.  He is poorly controlled with diabetes, much related to knowledge base and poor diet.  New referral to endo placed.      Phenylketonuria (PKU) (HCC)    Mental delays present, continue to work with CCM team which offers benefit.      Relevant Orders   Ambulatory referral to Endocrinology   Other Visit Diagnoses     Flu vaccine need       Flu vaccine today, educated him today.   Relevant Orders   Flu vaccine trivalent PF, 6mos and older(Flulaval,Afluria,Fluarix,Fluzone) (Completed)        Follow up plan: Return in about 1 week (around 01/08/2023) for CELLULITIS.

## 2023-01-01 NOTE — Assessment & Plan Note (Signed)
Ongoing, recent CT scan reassuring.  Continue collaboration with GI provider as needed.  Labs up to date.

## 2023-01-01 NOTE — Assessment & Plan Note (Signed)
Chronic, ongoing.  A1c 9.7% in July, slight trend down from 10.5%.  Recheck A1c today.  He had initial visit with [facility] endo recently and scheduled to return on 01/15/23, however needs new endo referral as insurance does not cover [facility].  New referral placed. Urine ALB 150 January 2024, continue Lisinopril. - Continue Jardiance, Januvia, + insulin as ordered by previous endo.   - Poor tolerance to Metformin in past with AKI presenting x 2 trials and allergic to Sulfa. Would avoid GLP at this time due to patient history of pancreatitis x 3, concern this would flare. Educated patient at length on effect of diabetes from head to toe and increased risk for recurrent CVA due to poor control.   - Recommend they check his BS TID -- he would benefit from [facility] will work on this with CCM team in future, however he prefers not to get at this time.  Has been working with pharmacist, SW, and nurse case manager. - Goal A1C <6.5% due to CVA history -- which reiterated at length with patient today.  Discussed at length what an A1c at his current level could cause. - Foot and eye exam up to date.

## 2023-01-01 NOTE — Assessment & Plan Note (Signed)
Grade 2/6 on auscultation, continue collaboration with cardiology.

## 2023-01-01 NOTE — Assessment & Plan Note (Signed)
BMI 29.04, maintaining some weight loss.  Recommended eating smaller high protein, low fat meals more frequently and exercising 30 mins a day 5 times a week with a goal of 10-15lb weight loss in the next 3 months. Patient voiced their understanding and motivation to adhere to these recommendations.

## 2023-01-01 NOTE — Assessment & Plan Note (Signed)
Chronic, stable.  BP close to goal in office today.  Will continue Lisinopril 10 MG dosing, further adjust next visit if remains on mildly elevated, had reduced in past due to occasional dizziness and low readings.  Labs: up to date.  Urine ALB 150 January 2024.  Recommend they check his BP at home at least daily and document + focus on DASH diet.  Continue to collaborate with cardiology.  Return to office in 3 months.

## 2023-01-01 NOTE — Assessment & Plan Note (Signed)
Chronic, ongoing.  A1c 9.7% in July, slight trend down from 10.5%.  Recheck A1c today.  He had initial visit with [facility] endo recently and scheduled to return on 01/15/23, however needs new endo referral as insurance does not cover [facility].  New referral placed. Urine ALB 150 January 2024, continue Lisinopril. - Continue Jardiance, Januvia, + insulin as ordered by previous endo.   - Poor tolerance to Metformin in past with AKI presenting x 2 trials and allergic to Sulfa. Would avoid GLP at this time due to patient history of pancreatitis x 3, concern this would flare. Educated patient at length on effect of diabetes from head to toe and increased risk for recurrent CVA due to poor control.   - Recommend they check his BS TID -- he would benefit from [facility] will work on this with CCM team in future, however he prefers not to get at this time.  Has been working with pharmacist, SW, and nurse case manager. - Goal A1C <6.5% due to CVA history -- which reiterated at length with patient today.  Discussed at length what an A1c at his current level could cause. - Foot and eye exam up to date. - Vaccinations up to date

## 2023-01-01 NOTE — Assessment & Plan Note (Signed)
Recurrent cases at time to various locations due to picking at bed bug bites.  Instructed him not to pick at areas due to higher risk for infection with diabetes.  They are working on clearing bed bugs from home.  Start Doxycyline 100 MG BID for 7 days and then recheck.  Mupirocin to area as needed.  He reports he will try to pick up abx, but unsure he can afford today -- states if he can not get it and wound becomes worse he will "just go to ER again".  Highly recommend he attempt to obtain oral abx.  Recommend he apply warm compresses to area about 4 times daily.  Return in one week.

## 2023-01-02 ENCOUNTER — Other Ambulatory Visit: Payer: Self-pay | Admitting: Nurse Practitioner

## 2023-01-02 LAB — HEMOGLOBIN A1C
Est. average glucose Bld gHb Est-mCnc: 266 mg/dL
Hgb A1c MFr Bld: 10.9 % — ABNORMAL HIGH (ref 4.8–5.6)

## 2023-01-02 LAB — LIPID PANEL W/O CHOL/HDL RATIO
Cholesterol, Total: 210 mg/dL — ABNORMAL HIGH (ref 100–199)
HDL: 36 mg/dL — ABNORMAL LOW (ref >39)
LDL Chol Calc (NIH): 105 mg/dL — ABNORMAL HIGH (ref 0–99)
Triglycerides: 405 mg/dL — ABNORMAL HIGH (ref 0–149)
VLDL Cholesterol Cal: 69 mg/dL — ABNORMAL HIGH (ref 5–40)

## 2023-01-02 MED ORDER — ROSUVASTATIN CALCIUM 40 MG PO TABS: 40.0000 mg | ORAL_TABLET | Freq: Every day | ORAL | 4 refills | Status: DC

## 2023-01-02 NOTE — Progress Notes (Signed)
Good morning crew, please let Mohammad know his labs have returned.  I tried calling and left general HIPAA compliant message.  Brayton Caves has tried to call too.  It is important we get in touch with him as we have a new endocrinologist who takes their coverage and could see him as early as next week:  Good morning Marvelous, your labs have returned: - Cholesterol levels remain elevated, I am going to change your Atorvastatin to Rosuvastatin. STOP Atorvastatin and START Rosuvastatin. Continue Fenofibrate and Vascepa. - Your A1c is 10.9%, trending back up.  Please increase your Tresiba to 45 units morning and 45 units at night.  We have also found a endocrinologist in [place] who will take your insurance and can get you in as early as next week.  PLEASE call Dr. Aurelio Brash office at [phone] to schedule.  If you need help scheduling let my nurses know and we can assist.  This is very important as we need to get levels under control.  Any questions? Keep being stellar!!  Thank you for allowing me to participate in your care.  I appreciate you. Kindest regards, Anasha Perfecto

## 2023-01-03 ENCOUNTER — Ambulatory Visit: Payer: Self-pay | Admitting: *Deleted

## 2023-01-03 ENCOUNTER — Ambulatory Visit: Payer: Medicare Other | Admitting: Cardiovascular Disease

## 2023-01-03 ENCOUNTER — Telehealth: Payer: Self-pay | Admitting: Nurse Practitioner

## 2023-01-03 NOTE — Telephone Encounter (Signed)
Copied from CRM [id number]. Topic: General - Inquiry >> Jan 03, 2023  8:17 AM Patsy Lager T wrote: Reason for CRM: spouse called request to have Dr Harvest Dark call her back. No info was given

## 2023-01-03 NOTE — Telephone Encounter (Signed)
  Chief Complaint: Wife called in for directions to endocrinologist's office her husband has been referred to by Aura Dials, NP.  Symptoms: N/A Frequency: N/A Pertinent Negatives: Patient denies N/A Disposition: [] ED /[] Urgent Care (no appt availability in office) / [] Appointment(In office/virtual)/ []  [place] [facility]/ [x] Home Care/ [] Refused Recommended Disposition /[] [place] [facility]/ []  Follow-up with PCP Additional Notes: Let her know she needed to call the endocrinologist's office and get directions.    I also read her the lab result message from [facility] dated 01/02/2023 at 12:05 PM but she said she already had been given the number for the endocrinologist by Jolene.     Wife said he is already taking the Rosuvastatin.

## 2023-01-03 NOTE — Telephone Encounter (Signed)
Seen yesterday 01/02/2023 by Alex Dials, NP.   She has referred him to another diabetic dr.      [name] calling regarding his lab work.   Needing results.   I read her the message from Alex Dials, NP dated 01/02/2023 at 12:05 PM.     I made sure she had the number for the endocrinologist which she said she already had.   She was asking where the office was but I let her know I wasn't sure and to call the endocrinologist's office and get directions.    She thanked me for my help. No other questions.   Reason for Disposition  [1] Follow-up call to recent contact AND [2] information only call, no triage required  Answer Assessment - Initial Assessment Questions 1. REASON FOR CALL or QUESTION: "What is your reason for calling today?" or "How can I best help you?" or "What question do you have that I can help answer?"     Wife calling in but husband there with her. She is calling about his lab results.  Protocols used: Information Only Call - No Triage-A-AH

## 2023-01-03 NOTE — Telephone Encounter (Signed)
Called and LVM asking for patient's wife to please return my call.   OK for PEC to speak with the patient's wife and find out what kind of questions she has for Jolene.

## 2023-01-07 ENCOUNTER — Telehealth: Payer: Self-pay | Admitting: Nurse Practitioner

## 2023-01-07 ENCOUNTER — Other Ambulatory Visit: Payer: Self-pay

## 2023-01-07 ENCOUNTER — Other Ambulatory Visit: Payer: Self-pay | Admitting: *Deleted

## 2023-01-07 MED ORDER — BLOOD PRESSURE KIT DEVI: 0 refills | Status: DC

## 2023-01-07 MED ORDER — PULSE OXIMETER FOR FINGER MISC: 1 refills | Status: AC

## 2023-01-07 NOTE — Patient Outreach (Signed)
Care Management   Visit Note  01/07/2023 Name: [name]. MRN: [id number] DOB: Feb 09, 1961  Subjective: [name]. is a 62 y.o. year old male who is a primary care patient of Cannady, Dorie Rank, NP. The Care Management team was consulted for assistance.      Engaged with patient spoke with patient by telephone.    Goals Addressed             This Visit's Progress    RNCM Care Management  Expected Outcome:  Monitor, Self-Manage and Reduce Symptoms of Diabetes       Current Barriers:  Knowledge Deficits related to the need to check blood sugars and keep blood sugars in normal range Care Coordination needs related to the importance of monitoring blood sugars, eating healthy, maintaining good control of DM in a patient with DM Chronic Disease Management support and education needs related to effective management of DM Literacy barriers  Lab Results  Component Value Date   HGBA1C 10.9 (H) 01/01/2023    Planned Interventions: Provided education to patient about basic DM disease process. The patient needs ongoing support and education with effective management of DM. The patient wife states that he continues to excessively drink soda and does not like to drink water. Alternative sugar free options provided to patient at this time.  He has been referred to a new endocrinologist and will be having an upcoming appointment.  Reviewed medications with patient and discussed importance of medication adherence. The patient is compliant with medications. He is taking his medications. He is working with the endocrinologist. Reviewed prescribed diet with patient heart healthy/ADA. The patient is doing better with ADA and options that will help with maintaining healthy blood sugars but having a hard time refraining from salt ; Counseled on importance of regular laboratory monitoring as prescribed. Review of goals of A1C and the patient is hoping his next A1C is much better;         Discussed plans with patient for ongoing care management follow up and provided patient with direct contact information for care management team;      Provided patient with written educational materials related to hypo and hyperglycemia and importance of correct treatment. The patient denies any new falls.  Education on safety and monitoring for changes in the way he feels. Denies and lows. Highest he has seen is >300.        Reviewed scheduled/upcoming provider appointments including:01-01-2023 at 120 pm, has a letter from his insurance and instructed him to take to his upcoming visit with the pcp.  Advised patient, providing education and rationale, to check cbg before meals and at bedtime and when you have symptoms of low or high blood sugar and record. CBG this morning 241. call provider for findings outside established parameters;       Review of patient status, including review of consultants reports, relevant laboratory and other test results, and medications completed;       Advised patient to discuss changes in DM, questions, or concerns with provider;     Sees eye specialist in January. Is possibly going to have new surgery to help vision. His vision is stable  Screening for signs and symptoms of depression related to chronic disease state;        Assessed social determinant of health barriers;         Symptom Management: Take medications as prescribed   Attend all scheduled provider appointments Call provider office for new concerns  or questions  call the Suicide and Crisis Lifeline: 988 call the [place] National Suicide Prevention Lifeline: 361-774-4047 or TTY: 787-184-0398 TTY 409 067 8176) to talk to a trained counselor call 1-800-273-TALK (toll free, 24 hour hotline) if experiencing a Mental Health or Behavioral Health Crisis  keep appointment with eye doctor check feet daily for cuts, sores or redness trim toenails straight across manage portion size wash and dry feet  carefully every day wear comfortable, cotton socks wear comfortable, well-fitting shoes  Follow Up Plan: Telephone follow up appointment with care management team member scheduled for:02-06-2023 at 145 pm       RNCM Care Management  Expected Outcome:  Monitor, Self-Manage and Reduce Symptoms of: Asthma       Current Barriers:  Knowledge Deficits related to triggers that can cause exacerbation of his asthma Chronic Disease Management support and education needs related to effective management of Asthma  Planned Interventions: Provided patient with basic written and verbal Asthma education on self care/management/and exacerbation prevention. Denies any issues with his breathing. The patient states his breathing is stable. Monitoring for changes in his breathing related to allergies. Denies any new concerns at this time. The patient is stable and doing well with his COPD. States that he has an increased work of breathing when working outside but recovers well once back inside. Advised patient to track and manage Asthma triggers. Review of the weather changes, being aware of people with infections, and monitoring for changes in his breathing;  Provided written and verbal instructions on pursed lip breathing and utilized returned demonstration as teach back; Provided instruction about proper use of medications used for management of Asthma including inhalers; Advised patient to self assesses Asthma action plan zone and make appointment with provider if in the yellow zone for 48 hours without improvement; Advised patient to engage in light exercise as tolerated 3-5 days a week to aid in the the management of Asthma; Provided education about and advised patient to utilize infection prevention strategies to reduce risk of respiratory infection. Education and support given; Discussed the importance of adequate rest and management of fatigue with Asthma; Screening for signs and symptoms of depression  related to chronic disease state;  Assessed social determinant of health barriers;    Symptom Management: Take medications as prescribed   Attend all scheduled provider appointments Call provider office for new concerns or questions  call the Suicide and Crisis Lifeline: 988 call the [place] National Suicide Prevention Lifeline: (718) 429-8160 or TTY: 520-722-9518 TTY (229) 885-0325) to talk to a trained counselor call 1-800-273-TALK (toll free, 24 hour hotline) if experiencing a Mental Health or Behavioral Health Crisis   Follow Up Plan: Telephone follow up appointment with care management team member scheduled for: 02-06-2023 at 145 pm       RNCM Care Management Expected Outcome:  Monitor, Self-Manage, and Reduce Symptoms of Hypertension       Current Barriers:  Knowledge Deficits related to the importance for following a heart healthy/ADA diet and the need to restrict sodium in dietary habits  Chronic Disease Management support and education needs related to effective management of HTN  BP Readings from Last 3 Encounters:  01/01/23 136/80  11/12/22 120/75  10/31/22 (!) 116/57     Planned Interventions: Evaluation of current treatment plan related to hypertension self management and patient's adherence to plan as established by provider. The patient stated that he has not been checking his blood pressure due to not having a bp cuff at home. Patient wife states she  has one but does not have batteries in her machine at this current time.He is scheduled for NST on 01-22-2023 & will have his Echocardiogram on 01-29-2023.  Education provided. Will continue to monitor for changes and new needs.  Provided education to patient re: stroke prevention, s/s of heart attack and stroke; Reviewed prescribed diet heart healthy/ADA diet. Spouse states that she has replaced seasonings in the home with salt-free version but patient has continued to hide salt shaker behind the microwave and uses it behind her  back. She states she continues to try and help in to decrease his salt intake. She says that it is for his own good. Education and support given.  Reviewed medications with patient and discussed importance of compliance. The patient states compliance with his medications. Works with the pharm D for effective management of medications;  Discussed plans with patient for ongoing care management follow up and provided patient with direct contact information for care management team; Advised patient, providing education and rationale, to monitor blood pressure daily and record, calling PCP for findings outside established parameters;  Reviewed scheduled/upcoming provider appointments including: PCP: 01-15-2023 for follow up for cellulitis. Advised patient to discuss changes in blood pressures or heart health with provider; Provided education on prescribed diet heart healthy/ADA diet. Education and support given. Review of heart healthy/ADA choices ;  Discussed complications of poorly controlled blood pressure such as heart disease, stroke, circulatory complications, vision complications, kidney impairment, sexual dysfunction;  Screening for signs and symptoms of depression related to chronic disease state;  Assessed social determinant of health barriers;   Symptom Management: Take medications as prescribed   Attend all scheduled provider appointments Call provider office for new concerns or questions  call the Suicide and Crisis Lifeline: 988 call the [place] National Suicide Prevention Lifeline: 626-419-2243 or TTY: 563-766-4657 TTY 8058311085) to talk to a trained counselor call 1-800-273-TALK (toll free, 24 hour hotline) if experiencing a Mental Health or Behavioral Health Crisis  check blood pressure 3 times per week learn about high blood pressure keep a blood pressure log take blood pressure log to all doctor appointments call doctor for signs and symptoms of high blood pressure develop an  action plan for high blood pressure keep all doctor appointments take medications for blood pressure exactly as prescribed report new symptoms to your doctor  Follow Up Plan: Telephone follow up appointment with care management team member scheduled for: 02-06-2023 at 145 pm           Consent to Services:  Patient was given information about care management services, agreed to services, and gave verbal consent to participate.   Plan: Telephone follow up appointment with care management team member scheduled for: 02-06-2023 145 pm.  Danise Edge, BSN RN RN Care Manager  [facility]  Ambulatory Care Management  Direct Number: [phone]

## 2023-01-07 NOTE — Patient Instructions (Addendum)
Visit Information  Thank you for taking time to visit with me today. Please don't hesitate to contact me if I can be of assistance to you before our next scheduled telephone appointment.  Following are the goals we discussed today:   Goals Addressed             This Visit's Progress    RNCM Care Management  Expected Outcome:  Monitor, Self-Manage and Reduce Symptoms of Diabetes       Current Barriers:  Knowledge Deficits related to the need to check blood sugars and keep blood sugars in normal range Care Coordination needs related to the importance of monitoring blood sugars, eating healthy, maintaining good control of DM in a patient with DM Chronic Disease Management support and education needs related to effective management of DM Literacy barriers  Lab Results  Component Value Date   HGBA1C 10.9 (H) 01/01/2023    Planned Interventions: Provided education to patient about basic DM disease process. The patient needs ongoing support and education with effective management of DM. The patient wife states that he continues to excessively drink soda and does not like to drink water. Alternative sugar free options provided to patient at this time.  He has been referred to a new endocrinologist and will be having an upcoming appointment.  Reviewed medications with patient and discussed importance of medication adherence. The patient is compliant with medications. He is taking his medications. He is working with the endocrinologist. Reviewed prescribed diet with patient heart healthy/ADA. The patient is doing better with ADA and options that will help with maintaining healthy blood sugars but having a hard time refraining from salt ; Counseled on importance of regular laboratory monitoring as prescribed. Review of goals of A1C and the patient is hoping his next A1C is much better;        Discussed plans with patient for ongoing care management follow up and provided patient with direct contact  information for care management team;      Provided patient with written educational materials related to hypo and hyperglycemia and importance of correct treatment. The patient denies any new falls.  Education on safety and monitoring for changes in the way he feels. Denies and lows. Highest he has seen is >300.        Reviewed scheduled/upcoming provider appointments including:01-01-2023 at 120 pm, has a letter from his insurance and instructed him to take to his upcoming visit with the pcp.  Advised patient, providing education and rationale, to check cbg before meals and at bedtime and when you have symptoms of low or high blood sugar and record. CBG this morning 241. call provider for findings outside established parameters;       Review of patient status, including review of consultants reports, relevant laboratory and other test results, and medications completed;       Advised patient to discuss changes in DM, questions, or concerns with provider;     Sees eye specialist in January. Is possibly going to have new surgery to help vision. His vision is stable  Screening for signs and symptoms of depression related to chronic disease state;        Assessed social determinant of health barriers;         Symptom Management: Take medications as prescribed   Attend all scheduled provider appointments Call provider office for new concerns or questions  call the Suicide and Crisis Lifeline: 988 call the [place] National Suicide Prevention Lifeline: (706) 409-0527 or TTY: 4314226516 TTY (  782-213-5608) to talk to a trained counselor call 1-800-273-TALK (toll free, 24 hour hotline) if experiencing a Mental Health or Behavioral Health Crisis  keep appointment with eye doctor check feet daily for cuts, sores or redness trim toenails straight across manage portion size wash and dry feet carefully every day wear comfortable, cotton socks wear comfortable, well-fitting shoes  Follow Up Plan: Telephone  follow up appointment with care management team member scheduled for:02-06-2023 at 145 pm       RNCM Care Management  Expected Outcome:  Monitor, Self-Manage and Reduce Symptoms of: Asthma       Current Barriers:  Knowledge Deficits related to triggers that can cause exacerbation of his asthma Chronic Disease Management support and education needs related to effective management of Asthma  Planned Interventions: Provided patient with basic written and verbal Asthma education on self care/management/and exacerbation prevention. Denies any issues with his breathing. The patient states his breathing is stable. Monitoring for changes in his breathing related to allergies. Denies any new concerns at this time. The patient is stable and doing well with his COPD. States that he has an increased work of breathing when working outside but recovers well once back inside. Advised patient to track and manage Asthma triggers. Review of the weather changes, being aware of people with infections, and monitoring for changes in his breathing;  Provided written and verbal instructions on pursed lip breathing and utilized returned demonstration as teach back; Provided instruction about proper use of medications used for management of Asthma including inhalers; Advised patient to self assesses Asthma action plan zone and make appointment with provider if in the yellow zone for 48 hours without improvement; Advised patient to engage in light exercise as tolerated 3-5 days a week to aid in the the management of Asthma; Provided education about and advised patient to utilize infection prevention strategies to reduce risk of respiratory infection. Education and support given; Discussed the importance of adequate rest and management of fatigue with Asthma; Screening for signs and symptoms of depression related to chronic disease state;  Assessed social determinant of health barriers;    Symptom Management: Take  medications as prescribed   Attend all scheduled provider appointments Call provider office for new concerns or questions  call the Suicide and Crisis Lifeline: 988 call the [place] National Suicide Prevention Lifeline: (302)609-9385 or TTY: (682) 044-2820 TTY 7094637470) to talk to a trained counselor call 1-800-273-TALK (toll free, 24 hour hotline) if experiencing a Mental Health or Behavioral Health Crisis   Follow Up Plan: Telephone follow up appointment with care management team member scheduled for: 02-06-2023 at 145 pm       RNCM Care Management Expected Outcome:  Monitor, Self-Manage, and Reduce Symptoms of Hypertension       Current Barriers:  Knowledge Deficits related to the importance for following a heart healthy/ADA diet and the need to restrict sodium in dietary habits  Chronic Disease Management support and education needs related to effective management of HTN  BP Readings from Last 3 Encounters:  01/01/23 136/80  11/12/22 120/75  10/31/22 (!) 116/57     Planned Interventions: Evaluation of current treatment plan related to hypertension self management and patient's adherence to plan as established by provider. The patient stated that he has not been checking his blood pressure due to not having a bp cuff at home. Patient wife states she has one but does not have batteries in her machine at this current time.He is scheduled for NST on 01-22-2023 & will  have his Echocardiogram on 01-29-2023.  Education provided. Will continue to monitor for changes and new needs.  Provided education to patient re: stroke prevention, s/s of heart attack and stroke; Reviewed prescribed diet heart healthy/ADA diet. Spouse states that she has replaced seasonings in the home with salt-free version but patient has continued to hide salt shaker behind the microwave and uses it behind her back. She states she continues to try and help in to decrease his salt intake. She says that it is for his own good.  Education and support given.  Reviewed medications with patient and discussed importance of compliance. The patient states compliance with his medications. Works with the pharm D for effective management of medications;  Discussed plans with patient for ongoing care management follow up and provided patient with direct contact information for care management team; Advised patient, providing education and rationale, to monitor blood pressure daily and record, calling PCP for findings outside established parameters;  Reviewed scheduled/upcoming provider appointments including: PCP: 01-15-2023 for follow up for cellulitis. Advised patient to discuss changes in blood pressures or heart health with provider; Provided education on prescribed diet heart healthy/ADA diet. Education and support given. Review of heart healthy/ADA choices ;  Discussed complications of poorly controlled blood pressure such as heart disease, stroke, circulatory complications, vision complications, kidney impairment, sexual dysfunction;  Screening for signs and symptoms of depression related to chronic disease state;  Assessed social determinant of health barriers;   Symptom Management: Take medications as prescribed   Attend all scheduled provider appointments Call provider office for new concerns or questions  call the Suicide and Crisis Lifeline: 988 call the [place] National Suicide Prevention Lifeline: 702-558-4350 or TTY: 202-206-6687 TTY 918-161-5035) to talk to a trained counselor call 1-800-273-TALK (toll free, 24 hour hotline) if experiencing a Mental Health or Behavioral Health Crisis  check blood pressure 3 times per week learn about high blood pressure keep a blood pressure log take blood pressure log to all doctor appointments call doctor for signs and symptoms of high blood pressure develop an action plan for high blood pressure keep all doctor appointments take medications for blood pressure exactly as  prescribed report new symptoms to your doctor  Follow Up Plan: Telephone follow up appointment with care management team member scheduled for: 02-06-2023 at 145 pm           Our next appointment is by telephone on 02-06-2023 at 145 pm.  Please call the care guide team at [phone] if you need to cancel or reschedule your appointment.   If you are experiencing a Mental Health or Behavioral Health Crisis or need someone to talk to, please call the Suicide and Crisis Lifeline: 988 call the [place] National Suicide Prevention Lifeline: (539)741-0366 or TTY: (404) 820-4854 TTY 615-315-2429) to talk to a trained counselor call 1-800-273-TALK (toll free, 24 hour hotline)   The patient verbalized understanding of instructions, educational materials, and care plan provided today and DECLINED offer to receive copy of patient instructions, educational materials, and care plan.   Telephone follow up appointment with care management team member scheduled for: 02-06-2023 at 145pm  Danise Edge, BSN RN RN Care Manager  [facility]  Direct Number: [phone]     Diabetes Mellitus and Nutrition, Adult When you have diabetes, or diabetes mellitus, it is very important to have healthy eating habits because your blood sugar (glucose) levels are greatly affected by what you eat and drink. Eating healthy foods in the right amounts,  at about the same times every day, can help you: Manage your blood glucose. Lower your risk of heart disease. Improve your blood pressure. Reach or maintain a healthy weight. What can affect my meal plan? Every person with diabetes is different, and each person has different needs for a meal plan. Your health care provider may recommend that you work with a dietitian to make a meal plan that is best for you. Your meal plan may vary depending on factors such as: The calories you need. The medicines you take. Your weight. Your blood  glucose, blood pressure, and cholesterol levels. Your activity level. Other health conditions you have, such as heart or kidney disease. How do carbohydrates affect me? Carbohydrates, also called carbs, affect your blood glucose level more than any other type of food. Eating carbs raises the amount of glucose in your blood. It is important to know how many carbs you can safely have in each meal. This is different for every person. Your dietitian can help you calculate how many carbs you should have at each meal and for each snack. How does alcohol affect me? Alcohol can cause a decrease in blood glucose (hypoglycemia), especially if you use insulin or take certain diabetes medicines by mouth. Hypoglycemia can be a life-threatening condition. Symptoms of hypoglycemia, such as sleepiness, dizziness, and confusion, are similar to symptoms of having too much alcohol. Do not drink alcohol if: Your health care provider tells you not to drink. You are pregnant, may be pregnant, or are planning to become pregnant. If you drink alcohol: Limit how much you have to: 0-1 drink a day for women. 0-2 drinks a day for men. Know how much alcohol is in your drink. In the [place] one drink equals one 12 oz bottle of beer (355 mL), one 5 oz glass of wine (148 mL), or one 1 oz glass of hard liquor (44 mL). Keep yourself hydrated with water, diet soda, or unsweetened iced tea. Keep in mind that regular soda, juice, and other mixers may contain a lot of sugar and must be counted as carbs. What are tips for following this plan?  Reading food labels Start by checking the serving size on the Nutrition Facts label of packaged foods and drinks. The number of calories and the amount of carbs, fats, and other nutrients listed on the label are based on one serving of the item. Many items contain more than one serving per package. Check the total grams (g) of carbs in one serving. Check the number of grams of saturated fats  and trans fats in one serving. Choose foods that have a low amount or none of these fats. Check the number of milligrams (mg) of salt (sodium) in one serving. Most people should limit total sodium intake to less than 2,300 mg per day. Always check the nutrition information of foods labeled as "low-fat" or "nonfat." These foods may be higher in added sugar or refined carbs and should be avoided. Talk to your dietitian to identify your daily goals for nutrients listed on the label. Shopping Avoid buying canned, pre-made, or processed foods. These foods tend to be high in fat, sodium, and added sugar. Shop around the outside edge of the grocery store. This is where you will most often find fresh fruits and vegetables, bulk grains, fresh meats, and fresh dairy products. Cooking Use low-heat cooking methods, such as baking, instead of high-heat cooking methods, such as deep frying. Cook using healthy oils, such as olive, canola, or  sunflower oil. Avoid cooking with butter, cream, or high-fat meats. Meal planning Eat meals and snacks regularly, preferably at the same times every day. Avoid going long periods of time without eating. Eat foods that are high in fiber, such as fresh fruits, vegetables, beans, and whole grains. Eat 4-6 oz (112-168 g) of lean protein each day, such as lean meat, chicken, fish, eggs, or tofu. One ounce (oz) (28 g) of lean protein is equal to: 1 oz (28 g) of meat, chicken, or fish. 1 egg.  cup (62 g) of tofu. Eat some foods each day that contain healthy fats, such as avocado, nuts, seeds, and fish. What foods should I eat? Fruits Berries. Apples. Oranges. Peaches. Apricots. Plums. Grapes. Mangoes. Papayas. Pomegranates. Kiwi. Cherries. Vegetables Leafy greens, including lettuce, spinach, kale, chard, collard greens, mustard greens, and cabbage. Beets. Cauliflower. Broccoli. Carrots. Green beans. Tomatoes. Peppers. Onions. Cucumbers. Brussels sprouts. Grains Whole grains,  such as whole-wheat or whole-grain bread, crackers, tortillas, cereal, and pasta. Unsweetened oatmeal. Quinoa. Brown or wild rice. Meats and other proteins Seafood. Poultry without skin. Lean cuts of poultry and beef. Tofu. Nuts. Seeds. Dairy Low-fat or fat-free dairy products such as milk, yogurt, and cheese. The items listed above may not be a complete list of foods and beverages you can eat and drink. Contact a dietitian for more information. What foods should I avoid? Fruits Fruits canned with syrup. Vegetables Canned vegetables. Frozen vegetables with butter or cream sauce. Grains Refined white flour and flour products such as bread, pasta, snack foods, and cereals. Avoid all processed foods. Meats and other proteins Fatty cuts of meat. Poultry with skin. Breaded or fried meats. Processed meat. Avoid saturated fats. Dairy Full-fat yogurt, cheese, or milk. Beverages Sweetened drinks, such as soda or iced tea. The items listed above may not be a complete list of foods and beverages you should avoid. Contact a dietitian for more information. Questions to ask a health care provider Do I need to meet with a certified diabetes care and education specialist? Do I need to meet with a dietitian? What number can I call if I have questions? When are the best times to check my blood glucose? Where to find more information: American Diabetes Association: diabetes.org Academy of Nutrition and Dietetics: eatright.[employer] of Diabetes and Digestive and Kidney Diseases: [email or web address].si Association of Diabetes Care & Education Specialists: diabeteseducator.org Summary It is important to have healthy eating habits because your blood sugar (glucose) levels are greatly affected by what you eat and drink. It is important to use alcohol carefully. A healthy meal plan will help you manage your blood glucose and lower your risk of heart disease. Your health care provider may recommend that  you work with a dietitian to make a meal plan that is best for you. This information is not intended to replace advice given to you by your health care provider. Make sure you discuss any questions you have with your health care provider. Document Revised: 10/07/2019 Document Reviewed: 10/07/2019 Elsevier Patient Education  2024 Elsevier Inc. Diabetes Mellitus and Foot Care Diabetes, also called diabetes mellitus, may cause problems with your feet and legs because of poor blood flow (circulation). Poor circulation may make your skin: Become thinner and drier. Break more easily. Heal more slowly. Peel and crack. You may also have nerve damage (neuropathy). This can cause decreased feeling in your legs and feet. This means that you may not notice minor injuries to your feet that could lead to more serious  problems. Finding and treating problems early is the best way to prevent future foot problems. How to care for your feet Foot hygiene  Wash your feet daily with warm water and mild soap. Do not use hot water. Then, pat your feet and the areas between your toes until they are fully dry. Do not soak your feet. This can dry your skin. Trim your toenails straight across. Do not dig under them or around the cuticle. File the edges of your nails with an emery board or nail file. Apply a moisturizing lotion or petroleum jelly to the skin on your feet and to dry, brittle toenails. Use lotion that does not contain alcohol and is unscented. Do not apply lotion between your toes. Shoes and socks Wear clean socks or stockings every day. Make sure they are not too tight. Do not wear knee-high stockings. These may decrease blood flow to your legs. Wear shoes that fit well and have enough cushioning. Always look in your shoes before you put them on to be sure there are no objects inside. To break in new shoes, wear them for just a few hours a day. This prevents injuries on your feet. Wounds, scrapes, corns,  and calluses  Check your feet daily for blisters, cuts, bruises, sores, and redness. If you cannot see the bottom of your feet, use a mirror or ask someone for help. Do not cut off corns or calluses or try to remove them with medicine. If you find a minor scrape, cut, or break in the skin on your feet, keep it and the skin around it clean and dry. You may clean these areas with mild soap and water. Do not clean the area with peroxide, alcohol, or iodine. If you have a wound, scrape, corn, or callus on your foot, look at it several times a day to make sure it is healing and not infected. Check for: Redness, swelling, or pain. Fluid or blood. Warmth. Pus or a bad smell. General tips Do not cross your legs. This may decrease blood flow to your feet. Do not use heating pads or hot water bottles on your feet. They may burn your skin. If you have lost feeling in your feet or legs, you may not know this is happening until it is too late. Protect your feet from hot and cold by wearing shoes, such as at the beach or on hot pavement. Schedule a complete foot exam at least once a year or more often if you have foot problems. Report any cuts, sores, or bruises to your health care provider right away. Where to find more information American Diabetes Association: diabetes.org Association of Diabetes Care & Education Specialists: diabeteseducator.org Contact a health care provider if: You have a condition that increases your risk of infection, and you have any cuts, sores, or bruises on your feet. You have an injury that is not healing. You have redness on your legs or feet. You feel burning or tingling in your legs or feet. You have pain or cramps in your legs and feet. Your legs or feet are numb. Your feet always feel cold. You have pain around any toenails. Get help right away if: You have a wound, scrape, corn, or callus on your foot and: You have signs of infection. You have a fever. You have a  red line going up your leg. This information is not intended to replace advice given to you by your health care provider. Make sure you discuss any questions you  have with your health care provider. Document Revised: 09/06/2021 Document Reviewed: 09/06/2021 Elsevier Patient Education  2024 [employer].

## 2023-01-07 NOTE — Telephone Encounter (Signed)
Routed to provider

## 2023-01-07 NOTE — Telephone Encounter (Signed)
Pt would like to know if Jolene will give him a script to get a O2 meter and a BP cuff.  He said if he had those 2 things, he could write it down in the composition book he has and keep a record of these things

## 2023-01-13 NOTE — Patient Instructions (Signed)
Diabetes Mellitus and Skin Care Diabetes, also called diabetes mellitus, can lead to skin problems. If blood sugar (glucose) is not well controlled, it can cause problems over time. These problems include: Damage to nerves. This can affect your ability to feel wounds. This means you may not notice small skin injuries that could lead to bigger problems. This can also decrease the amount that you sweat, causing dry skin. Damage to blood vessels. The lack of blood flow can cause skin to break down. It can also slow healing time, which can lead to infections. Areas of skin that become thick or discolored. Common skin conditions There are certain skin conditions that often affect people with diabetes. These include: Dry skin. Thin skin. The skin on the feet may get thinner, break more easily, and heal more slowly than normal. Skin infections from bacteria. These include: Styes. These are infections near the eyelid. Boils. These are bumps filled with pus. Infected hair follicles. Infections of the skin around the nails. Fungal skin infections. These are most common in areas where skin rubs together, such as in the armpits or under the breasts. Common skin changes Diabetes can also cause the skin to change. You may develop: Dark, velvety markings on your skin. These may appear on your face, neck, armpits, inner thighs, and groin. Red, raised, scar-like tissue that may itch, feel painful, or become a wound. Blisters on your feet, toes, hands, or fingers. Thick, wax-like areas of skin. In most cases, these occur on the hands, forehead, or toes. Brown or red, ring-shaped or half-ring-shaped patches of skin on the ears or fingers. Pea-shaped, yellow bumps that may be itchy and have a red ring around them. This may affect your arms, feet, buttocks, and the top of your hands. Round, discolored patches of tan skin that do not hurt or itch. These may look like age spots. Supplies needed: Mild soap or  gentle skin cleanser. Lotion. How to care for dry, itchy skin Frequent high glucose levels can cause skin to become itchy. Poor blood circulation and skin infections can make dry skin worse. If you have dry, itchy skin: Avoid very hot showers and baths. Use mild soap and gentle skin cleansers. Do not use soap that is perfumed, harsh, or that dries your skin. Moisturizing soaps may help. Put on moisturizing lotion as soon as you finish bathing. Do not scratch dry skin. Scratching can expose skin to infection. If you have a rash or if your skin is very itchy, contact your health care provider. Skin that is red or covered in a rash may be a sign of an allergic reaction. Very itchy skin may mean that you need help to manage your diabetes better. You may also need treatment for an infection. General tips Most skin problems can be prevented or treated easily if caught early. Talk with your health care provider if you have any concerns. General tips include: Check your skin every day for cuts, bruises, redness, blisters, or sores, especially on your feet. If you cannot see the bottom of your feet, use a mirror or ask someone for help. Tell your health care provider if you have any of these injuries and if they are healing slowly. Keep your skin clean and dry. Do not use hot water. Moisturize your skin to prevent chapping. Keep your blood glucose levels within target range. Follow these instructions at home:  Take over-the-counter and prescription medicines only as told by your health care provider. This includes all diabetes medicines   you are taking. Schedule a foot exam with your health care provider once a year. During the exam, the structure and skin of your feet will be checked for problems. Make sure that your health care provider does a visual foot exam at every visit. If you get a skin injury, such as a cut, blister, or sore, check the area every day for signs of infection. Check for: Redness,  swelling, or pain. Fluid or blood. Warmth. Pus or a bad smell. Do not use any products that contain nicotine or tobacco. These products include cigarettes, chewing tobacco, and vaping devices, such as e-cigarettes. If you need help quitting, ask your health care provider. Where to find more information American Diabetes Association: diabetes.org Association of Diabetes Care & Education Specialists: diabeteseducator.org Contact a health care provider if: You get a cut or sore, especially on your feet. You have signs of infection after a skin injury. You have itchy skin that turns red or develops a rash. You have discolored areas of skin. You have places on your skin that change. They may thicken or appear shiny. This information is not intended to replace advice given to you by your health care provider. Make sure you discuss any questions you have with your health care provider. Document Revised: 09/06/2021 Document Reviewed: 09/06/2021 Elsevier Patient Education  2024 [employer]  

## 2023-01-15 ENCOUNTER — Ambulatory Visit (INDEPENDENT_AMBULATORY_CARE_PROVIDER_SITE_OTHER): Payer: Medicare Other | Admitting: Nurse Practitioner

## 2023-01-15 ENCOUNTER — Encounter: Payer: Self-pay | Admitting: Nurse Practitioner

## 2023-01-15 VITALS — BP 138/78 | HR 51 | Temp 97.4°F | Ht 64.0 in | Wt 167.4 lb

## 2023-01-15 DIAGNOSIS — L03116 Cellulitis of left lower limb: Secondary | ICD-10-CM

## 2023-01-15 DIAGNOSIS — E1165 Type 2 diabetes mellitus with hyperglycemia: Secondary | ICD-10-CM

## 2023-01-15 MED ORDER — PULSE OXIMETER MISC: 1.0000 | 0 refills | Status: DC

## 2023-01-15 MED ORDER — BLOOD PRESSURE MONITOR/L CUFF MISC: 1.0000 | Freq: Every day | 0 refills | Status: DC

## 2023-01-15 NOTE — Assessment & Plan Note (Signed)
Recurrent but improving, due to picking at bed bug bites.  Instructed him not to pick at areas due to higher risk for infection with diabetes.  They are working on clearing bed bugs from home.  Complete Doxycyline course.  Mupirocin to area as needed.  Continue warm compresses.  Overall healing and improved on exam today.

## 2023-01-15 NOTE — Addendum Note (Signed)
Addended by: Aura Dials T on: 01/15/2023 11:14 AM   Modules accepted: Level of Service

## 2023-01-15 NOTE — Assessment & Plan Note (Signed)
Chronic, ongoing.  A1c 10.2% recently.  He is scheduled to establish care with Dr. Dario Guardian in November, which will be beneficial to have consistent endocrinology care.  Wrote all information down for him and called office to ensure he is scheduled.  Urine ALB 150 January 2024, continue Lisinopril. - Continue Jardiance, Januvia, + insulin as ordered by previous endo.   - Poor tolerance to Metformin in past with AKI presenting x 2 trials and allergic to Sulfa. Would avoid GLP at this time due to patient history of pancreatitis x 3, concern this would flare. Educated patient at length on effect of diabetes from head to toe and increased risk for recurrent CVA due to poor control.   - Recommend they check his BS TID -- he would benefit from [facility] will work on this with CCM team in future, however he prefers not to get at this time.  Has been working with pharmacist, SW, and nurse case manager. - Goal A1C <6.5% due to CVA history -- which reiterated at length with patient today.  Discussed at length what an A1c at his current level could cause. - Foot and eye exam up to date.

## 2023-01-15 NOTE — Progress Notes (Addendum)
BP 138/78 (BP Location: Left Arm, Patient Position: Sitting)   Pulse (!) 51   Temp (!) 97.4 F (36.3 C) (Oral)   Ht 5\' 4"  (1.626 m)   Wt 167 lb 6.4 oz (75.9 kg)   SpO2 97%   BMI 28.73 kg/m    Subjective:    Patient ID: [name]., male    DOB: [birth year], 62 y.o.   MRN: [id number]  HPI: [name]. is a 62 y.o. male  Chief Complaint  Patient presents with   Cellulitis    1 week f/up   SKIN INFECTION Presents today for follow-up on cellulitis to left lower leg.  Treated with Doxycycline. He is still taking Doxycycline as ordered and using Mupirocin cream as ordered. He reports it is improving and they are working on getting bed bugs out of house.  While in room, PCP called Dr. Aurelio Brash office (diabetes provider) to ensure patient was scheduled as new patient.  He is scheduled on November 6th at 9:30 am.  Wrote all information down for him on this and instructed him to ensure he shows up for visit.  He reports he has Medicaid now and this will help with specialist visits. Duration: weeks Location: left lower leg History of trauma in area: no Pain: no Quality: no Severity: none Redness: improving Swelling: improving Oozing: no Pus: no Fevers: no Nausea/vomiting: no Status: better Treatments attempted:antibiotics and warm compresses + Mupirocin  Relevant past medical, surgical, family and social history reviewed and updated as indicated. Interim medical history since our last visit reviewed. Allergies and medications reviewed and updated.  Review of Systems  Constitutional:  Negative for activity change, diaphoresis, fatigue and fever.  Respiratory:  Negative for cough, chest tightness, shortness of breath and wheezing.   Cardiovascular:  Negative for chest pain, palpitations and leg swelling.  Gastrointestinal: Negative.   Skin:  Positive for wound.  Neurological:  Negative for weakness.  Psychiatric/Behavioral: Negative.      Per HPI  unless specifically indicated above     Objective:    BP 138/78 (BP Location: Left Arm, Patient Position: Sitting)   Pulse (!) 51   Temp (!) 97.4 F (36.3 C) (Oral)   Ht 5\' 4"  (1.626 m)   Wt 167 lb 6.4 oz (75.9 kg)   SpO2 97%   BMI 28.73 kg/m   Wt Readings from Last 3 Encounters:  01/15/23 167 lb 6.4 oz (75.9 kg)  01/01/23 169 lb 3.2 oz (76.7 kg)  11/12/22 168 lb 9.6 oz (76.5 kg)    Physical Exam Vitals and nursing note reviewed.  Constitutional:      General: He is awake. He is not in acute distress.    Appearance: Normal appearance. He is well-developed. He is obese. He is not ill-appearing.  HENT:     Head: Normocephalic and atraumatic.     Right Ear: Hearing and external ear normal. No drainage.     Left Ear: Hearing and external ear normal. No drainage.  Eyes:     General: Lids are normal.        Right eye: No discharge.        Left eye: No discharge.     Conjunctiva/sclera: Conjunctivae normal.     Pupils: Pupils are equal, round, and reactive to light.  Neck:     Thyroid: No thyromegaly.     Vascular: Carotid bruit (R>L) present.  Cardiovascular:     Rate and Rhythm: Regular rhythm. Bradycardia present.  Heart sounds: S1 normal and S2 normal. Murmur heard.     Systolic murmur is present with a grade of 2/6.     No gallop.     Comments: Systolic murmur noted best at left sternal border, soft. Pulmonary:     Effort: Pulmonary effort is normal. No accessory muscle usage or respiratory distress.     Breath sounds: Normal breath sounds.  Abdominal:     General: Bowel sounds are normal. There is no distension.     Palpations: Abdomen is soft.     Tenderness: There is no abdominal tenderness.  Musculoskeletal:     Cervical back: Normal range of motion and neck supple.     Right lower leg: No edema.     Left lower leg: No edema.  Lymphadenopathy:     Cervical: No cervical adenopathy.  Skin:    General: Skin is warm and dry.     Capillary Refill: Capillary  refill takes less than 2 seconds.          Comments: Scattered small round areas of crusting to arms bilaterally, he reports bed bug bites he picked at.  Neurological:     Mental Status: He is alert and oriented to person, place, and time.     Deep Tendon Reflexes: Reflexes are normal and symmetric.  Psychiatric:        Attention and Perception: Attention normal.        Mood and Affect: Mood normal.        Speech: Speech normal.        Behavior: Behavior normal. Behavior is cooperative.     Results for orders placed or performed in visit on 01/01/23  Lipid Panel w/o Chol/HDL Ratio  Result Value Ref Range   Cholesterol, Total 210 (H) 100 - 199 mg/dL   Triglycerides 664 (H) 0 - 149 mg/dL   HDL 36 (L) >40 mg/dL   VLDL Cholesterol Cal 69 (H) 5 - 40 mg/dL   LDL Chol Calc (NIH) 347 (H) 0 - 99 mg/dL  HgB Q2V  Result Value Ref Range   Hgb A1c MFr Bld 10.9 (H) 4.8 - 5.6 %   Est. average glucose Bld gHb Est-mCnc 266 mg/dL      Assessment & Plan:   Problem List Items Addressed This Visit       Endocrine   Uncontrolled type 2 diabetes mellitus with hyperglycemia (HCC) - Primary    Chronic, ongoing.  A1c 10.2% recently.  He is scheduled to establish care with Dr. Dario Guardian in November, which will be beneficial to have consistent endocrinology care.  Wrote all information down for him and called office to ensure he is scheduled.  Urine ALB 150 January 2024, continue Lisinopril. - Continue Jardiance, Januvia, + insulin as ordered by previous endo.   - Poor tolerance to Metformin in past with AKI presenting x 2 trials and allergic to Sulfa. Would avoid GLP at this time due to patient history of pancreatitis x 3, concern this would flare. Educated patient at length on effect of diabetes from head to toe and increased risk for recurrent CVA due to poor control.   - Recommend they check his BS TID -- he would benefit from [facility] will work on this with CCM team in future, however he prefers  not to get at this time.  Has been working with pharmacist, SW, and nurse case manager. - Goal A1C <6.5% due to CVA history -- which reiterated at length with patient today.  Discussed at length what an A1c at his current level could cause. - Foot and eye exam up to date.        Other   Cellulitis    Recurrent but improving, due to picking at bed bug bites.  Instructed him not to pick at areas due to higher risk for infection with diabetes.  They are working on clearing bed bugs from home.  Complete Doxycyline course.  Mupirocin to area as needed.  Continue warm compresses.  Overall healing and improved on exam today.       Time: 25 minutes, >50% spent counseling/or care coordination + ensuring endocrinology scheduled and writing down information for him.  Follow up plan: Return in about 3 months (around 04/17/2023) for T2DM, HTN/HLD = 40 minute visit.

## 2023-01-21 DIAGNOSIS — H1789 Other corneal scars and opacities: Secondary | ICD-10-CM | POA: Diagnosis not present

## 2023-01-21 DIAGNOSIS — E113293 Type 2 diabetes mellitus with mild nonproliferative diabetic retinopathy without macular edema, bilateral: Secondary | ICD-10-CM | POA: Diagnosis not present

## 2023-01-21 DIAGNOSIS — H04123 Dry eye syndrome of bilateral lacrimal glands: Secondary | ICD-10-CM | POA: Diagnosis not present

## 2023-01-21 DIAGNOSIS — Z961 Presence of intraocular lens: Secondary | ICD-10-CM | POA: Diagnosis not present

## 2023-01-22 ENCOUNTER — Ambulatory Visit (INDEPENDENT_AMBULATORY_CARE_PROVIDER_SITE_OTHER): Payer: Medicare Other

## 2023-01-22 DIAGNOSIS — I6523 Occlusion and stenosis of bilateral carotid arteries: Secondary | ICD-10-CM | POA: Diagnosis not present

## 2023-01-22 DIAGNOSIS — R0789 Other chest pain: Secondary | ICD-10-CM

## 2023-01-22 DIAGNOSIS — E1165 Type 2 diabetes mellitus with hyperglycemia: Secondary | ICD-10-CM

## 2023-01-22 DIAGNOSIS — I209 Angina pectoris, unspecified: Secondary | ICD-10-CM

## 2023-01-22 DIAGNOSIS — E1159 Type 2 diabetes mellitus with other circulatory complications: Secondary | ICD-10-CM | POA: Diagnosis not present

## 2023-01-22 DIAGNOSIS — R55 Syncope and collapse: Secondary | ICD-10-CM | POA: Diagnosis not present

## 2023-01-22 DIAGNOSIS — E1169 Type 2 diabetes mellitus with other specified complication: Secondary | ICD-10-CM

## 2023-01-22 MED ORDER — TECHNETIUM TC 99M SESTAMIBI GENERIC - CARDIOLITE
31.8000 | Freq: Once | INTRAVENOUS | Status: AC | PRN
  Administered 2023-01-22: 31.8 via INTRAVENOUS

## 2023-01-22 MED ORDER — TECHNETIUM TC 99M SESTAMIBI GENERIC - CARDIOLITE
10.4000 | Freq: Once | INTRAVENOUS | Status: AC | PRN
  Administered 2023-01-22: 10.4 via INTRAVENOUS

## 2023-01-23 ENCOUNTER — Telehealth: Payer: Self-pay | Admitting: Nurse Practitioner

## 2023-01-23 DIAGNOSIS — I10 Essential (primary) hypertension: Secondary | ICD-10-CM | POA: Diagnosis not present

## 2023-01-23 DIAGNOSIS — E1165 Type 2 diabetes mellitus with hyperglycemia: Secondary | ICD-10-CM | POA: Diagnosis not present

## 2023-01-23 DIAGNOSIS — R5383 Other fatigue: Secondary | ICD-10-CM | POA: Diagnosis not present

## 2023-01-23 DIAGNOSIS — E785 Hyperlipidemia, unspecified: Secondary | ICD-10-CM | POA: Diagnosis not present

## 2023-01-23 DIAGNOSIS — N289 Disorder of kidney and ureter, unspecified: Secondary | ICD-10-CM | POA: Diagnosis not present

## 2023-01-23 NOTE — Telephone Encounter (Signed)
Copied from CRM [id number]. Topic: General - Other >> Jan 23, 2023 11:12 AM Turkey B wrote: Reason for CRM: pt's wife called in states, diabetic Dr wants him to have labs done,asap for his A1c, they dont do  this at their office

## 2023-01-25 ENCOUNTER — Other Ambulatory Visit: Payer: Self-pay | Admitting: Nurse Practitioner

## 2023-01-25 DIAGNOSIS — N400 Enlarged prostate without lower urinary tract symptoms: Secondary | ICD-10-CM

## 2023-01-25 DIAGNOSIS — E1129 Type 2 diabetes mellitus with other diabetic kidney complication: Secondary | ICD-10-CM

## 2023-01-25 NOTE — Telephone Encounter (Signed)
Pt's wife [name] following up on request for pt to have his labs done here at [facility].  [name] thinks they want to have his tsh levels checked.  Pt had his A1C done 10/15 at [facility].  Please advise.  Pt need labs done prior to Wed, Nov 13.

## 2023-01-28 ENCOUNTER — Other Ambulatory Visit: Payer: Medicare Other

## 2023-01-28 DIAGNOSIS — R809 Proteinuria, unspecified: Secondary | ICD-10-CM | POA: Diagnosis not present

## 2023-01-28 DIAGNOSIS — N400 Enlarged prostate without lower urinary tract symptoms: Secondary | ICD-10-CM

## 2023-01-28 DIAGNOSIS — E1129 Type 2 diabetes mellitus with other diabetic kidney complication: Secondary | ICD-10-CM | POA: Diagnosis not present

## 2023-01-28 DIAGNOSIS — I152 Hypertension secondary to endocrine disorders: Secondary | ICD-10-CM | POA: Diagnosis not present

## 2023-01-28 DIAGNOSIS — E1159 Type 2 diabetes mellitus with other circulatory complications: Secondary | ICD-10-CM

## 2023-01-28 DIAGNOSIS — E559 Vitamin D deficiency, unspecified: Secondary | ICD-10-CM

## 2023-01-28 LAB — BAYER DCA HB A1C WAIVED: HB A1C (BAYER DCA - WAIVED): 11.4 % — ABNORMAL HIGH (ref 4.8–5.6)

## 2023-01-28 NOTE — Progress Notes (Signed)
Please fax this A1c result to Dr. Dario Guardian here in [place].  Thank you.

## 2023-01-28 NOTE — Telephone Encounter (Signed)
Pt came in today to get his labs done because his wife already had an appointment for an office visit with her provider.  Will fax A1C results to Dr. Dario Guardian office [phone].

## 2023-01-29 ENCOUNTER — Ambulatory Visit (INDEPENDENT_AMBULATORY_CARE_PROVIDER_SITE_OTHER): Payer: Medicare Other

## 2023-01-29 ENCOUNTER — Telehealth: Payer: Self-pay | Admitting: Nurse Practitioner

## 2023-01-29 DIAGNOSIS — R55 Syncope and collapse: Secondary | ICD-10-CM

## 2023-01-29 DIAGNOSIS — E1169 Type 2 diabetes mellitus with other specified complication: Secondary | ICD-10-CM

## 2023-01-29 DIAGNOSIS — I6523 Occlusion and stenosis of bilateral carotid arteries: Secondary | ICD-10-CM

## 2023-01-29 DIAGNOSIS — I209 Angina pectoris, unspecified: Secondary | ICD-10-CM | POA: Diagnosis not present

## 2023-01-29 DIAGNOSIS — E1165 Type 2 diabetes mellitus with hyperglycemia: Secondary | ICD-10-CM

## 2023-01-29 DIAGNOSIS — R0789 Other chest pain: Secondary | ICD-10-CM

## 2023-01-29 DIAGNOSIS — I152 Hypertension secondary to endocrine disorders: Secondary | ICD-10-CM

## 2023-01-29 LAB — BASIC METABOLIC PANEL
BUN/Creatinine Ratio: 13 (ref 9–20)
BUN: 13 mg/dL (ref 6–24)
CO2: 18 mmol/L — ABNORMAL LOW (ref 20–29)
Calcium: 9 mg/dL (ref 8.7–10.2)
Chloride: 101 mmol/L (ref 96–106)
Creatinine, Ser: 1.03 mg/dL (ref 0.76–1.27)
Glucose: 557 mg/dL (ref 70–99)
Potassium: 4.5 mmol/L (ref 3.5–5.2)
Sodium: 136 mmol/L (ref 134–144)
eGFR: 84 mL/min/{1.73_m2} (ref >59)

## 2023-01-29 LAB — VITAMIN D 25 HYDROXY (VIT D DEFICIENCY, FRACTURES): Vit D, 25-Hydroxy: 19.3 ng/mL — ABNORMAL LOW (ref 30.0–100.0)

## 2023-01-29 LAB — HEPATIC FUNCTION PANEL
ALT: 45 [IU]/L — ABNORMAL HIGH (ref 0–44)
AST: 20 [IU]/L (ref 0–40)
Albumin: 4.4 g/dL (ref 3.8–4.9)
Alkaline Phosphatase: 205 [IU]/L — ABNORMAL HIGH (ref 44–121)
Bilirubin Total: 0.3 mg/dL (ref 0.0–1.2)
Bilirubin, Direct: 0.1 mg/dL (ref 0.00–0.40)
Total Protein: 6.7 g/dL (ref 6.0–8.5)

## 2023-01-29 LAB — PSA: Prostate Specific Ag, Serum: 0.2 ng/mL (ref 0.0–4.0)

## 2023-01-29 LAB — T4, FREE: Free T4: 0.92 ng/dL (ref 0.82–1.77)

## 2023-01-29 LAB — TSH: TSH: 1.04 u[IU]/mL (ref 0.450–4.500)

## 2023-01-29 NOTE — Progress Notes (Signed)
Please fax these to Dr. Dario Guardian, he is awaiting these results.  Also call [name] and his wife to let them know labs have returned: - A1c remains >10%, I highly recommend he ensure he attends visits with Dr.Jadali to help get this under control. - Glucose on these labs was high at 557.  Too high.  You obtained labs at 3:50 in afternoon and I suspect you had recently ate, but sugar should not be this elevated.  Please ensure you are taking insulin as ordered and eating a healthy diet. - Kidney and liver function are stable. - Prostate blood work is normal and thyroid lab stable. We will send these to Dr. Dario Guardian and I highly recommend you work with them closely and not miss visits as your diabetes remains not controlled which puts you at even higher risk for stroke.  Any questions? Keep being stellar!!  Thank you for allowing me to participate in your care.  I appreciate you. Kindest regards, Aleane Wesenberg

## 2023-01-29 NOTE — Telephone Encounter (Signed)
[name] with [facility] calling with Critical Glucose - 557. Practice currently closed for lunch. Will forward.

## 2023-01-30 DIAGNOSIS — N289 Disorder of kidney and ureter, unspecified: Secondary | ICD-10-CM | POA: Diagnosis not present

## 2023-01-30 DIAGNOSIS — E1165 Type 2 diabetes mellitus with hyperglycemia: Secondary | ICD-10-CM | POA: Diagnosis not present

## 2023-01-30 DIAGNOSIS — R945 Abnormal results of liver function studies: Secondary | ICD-10-CM | POA: Diagnosis not present

## 2023-01-30 NOTE — Telephone Encounter (Signed)
Called and spoke with Crystal at Dr. Aurelio Brash office. She states she was able to see the lab results on the [facility] site so we do not need to send over results. She states they have what they need.

## 2023-02-05 ENCOUNTER — Ambulatory Visit (INDEPENDENT_AMBULATORY_CARE_PROVIDER_SITE_OTHER): Payer: Medicare Other | Admitting: Cardiovascular Disease

## 2023-02-05 ENCOUNTER — Encounter: Payer: Self-pay | Admitting: Cardiovascular Disease

## 2023-02-05 VITALS — BP 138/84 | HR 57 | Ht 64.0 in | Wt 172.2 lb

## 2023-02-05 DIAGNOSIS — E1165 Type 2 diabetes mellitus with hyperglycemia: Secondary | ICD-10-CM | POA: Diagnosis not present

## 2023-02-05 DIAGNOSIS — R0789 Other chest pain: Secondary | ICD-10-CM | POA: Diagnosis not present

## 2023-02-05 DIAGNOSIS — I6523 Occlusion and stenosis of bilateral carotid arteries: Secondary | ICD-10-CM | POA: Diagnosis not present

## 2023-02-05 DIAGNOSIS — I152 Hypertension secondary to endocrine disorders: Secondary | ICD-10-CM

## 2023-02-05 DIAGNOSIS — E1169 Type 2 diabetes mellitus with other specified complication: Secondary | ICD-10-CM

## 2023-02-05 DIAGNOSIS — E1159 Type 2 diabetes mellitus with other circulatory complications: Secondary | ICD-10-CM | POA: Diagnosis not present

## 2023-02-05 DIAGNOSIS — K219 Gastro-esophageal reflux disease without esophagitis: Secondary | ICD-10-CM

## 2023-02-05 DIAGNOSIS — E785 Hyperlipidemia, unspecified: Secondary | ICD-10-CM

## 2023-02-05 DIAGNOSIS — I259 Chronic ischemic heart disease, unspecified: Secondary | ICD-10-CM

## 2023-02-05 MED ORDER — PANTOPRAZOLE SODIUM 40 MG PO TBEC: 40.0000 mg | DELAYED_RELEASE_TABLET | Freq: Every day | ORAL | 1 refills | Status: DC

## 2023-02-05 MED ORDER — ISOSORBIDE MONONITRATE ER 30 MG PO TB24: 30.0000 mg | ORAL_TABLET | Freq: Every day | ORAL | 1 refills | Status: DC

## 2023-02-05 NOTE — Progress Notes (Signed)
Cardiology Office Note   Date:  02/05/2023   ID:  [name], DOB 10/27/1960, MRN [id number]  PCP:  Marjie Skiff, NP  Cardiologist:  Adrian Blackwater, MD      History of Present Illness: [name]. is a 62 y.o. male who presents for  Chief Complaint  Patient presents with   Follow-up    NST and Echo Results    Chest Pain  This is a recurrent problem. The current episode started more than 1 year ago. The problem has been waxing and waning. The pain is present in the substernal region. The pain is at a severity of 4/10.      Past Medical History:  Diagnosis Date   Arrhythmia    Asthma    CCC (chronic calculous cholecystitis)    COPD (chronic obstructive pulmonary disease) (HCC)    Cyst of kidney, acquired    Diabetes mellitus without complication (HCC) 2013   type 2   Diverticulosis    Edentulous    Fatty liver    Gallbladder polyp    GERD (gastroesophageal reflux disease)    Heart murmur    History of chicken pox    History of measles as a child    History of PKU    Hyperlipidemia    Hypertension    IBS (irritable bowel syndrome)    Irregular heart beat    Mentally challenged    Pancreatitis    PONV (postoperative nausea and vomiting)    Stroke (HCC) 03/02/2019   vision issues - right eye/right sided weakness   Vision impairment    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   CARDIAC CATHETERIZATION     [facility]   CATARACT EXTRACTION W/PHACO Left 06/05/2021   Procedure: CATARACT EXTRACTION PHACO AND INTRAOCULAR LENS PLACEMENT (IOC) LEFT DIABETIC 3.75 00:28.0;  Surgeon: Nevada Crane, MD;  Location: [facility];  Service: Ophthalmology;  Laterality: Left;  Diabetic   CATARACT EXTRACTION W/PHACO Right 07/10/2021   Procedure: CATARACT EXTRACTION PHACO AND INTRAOCULAR LENS PLACEMENT (IOC) RIGHT DIABETIC;  Surgeon: Nevada Crane, MD;   Location: [facility];  Service: Ophthalmology;  Laterality: Right;  Diabetic 2.66 00:24.4   COLONOSCOPY     COLONOSCOPY WITH PROPOFOL N/A 08/10/2020   Procedure: COLONOSCOPY WITH PROPOFOL;  Surgeon: Toney Reil, MD;  Location: [facility];  Service: Gastroenterology;  Laterality: N/A;  Has ankle monitor; (Parole officer "Ms Ward Givens needs a time before Monday [phone]" - per patient's wife)   ESOPHAGOGASTRODUODENOSCOPY (EGD) WITH PROPOFOL N/A 12/27/2016   Procedure: ESOPHAGOGASTRODUODENOSCOPY (EGD) WITH PROPOFOL;  Surgeon: Toney Reil, MD;  Location: [facility];  Service: Gastroenterology;  Laterality: N/A;   HEMORRHOID SURGERY     KNEE SURGERY     cyst removed   Ligament Removal Left    of left thumb: dr. Alberteen Spindle   ligament removal  of left thumb     Dr. Alberteen Spindle   NM GATED MYOCARDIAL STUDY (ARMX HX)  06/23/2014   Paraschos. Normal   WISDOM TOOTH EXTRACTION       Current Outpatient Medications  Medication Sig Dispense Refill   acetaminophen (TYLENOL) 500 MG tablet Take 500 mg by mouth every 6 (six) hours as needed.     albuterol (VENTOLIN HFA) 108 (90 Base) MCG/ACT inhaler Inhale 2 puffs into the lungs every 6 (six) hours as needed.  18 g 0   aspirin 81 MG EC tablet Take 1 tablet (81 mg total) by mouth daily. 180 tablet 0   clopidogrel (PLAVIX) 75 MG tablet Take 1 tablet (75 mg total) by mouth daily. 90 tablet 4   empagliflozin (JARDIANCE) 25 MG TABS tablet Take 1 tablet (25 mg total) by mouth daily. 90 tablet 4   fenofibrate 54 MG tablet Take 1 tablet (54 mg total) by mouth daily. 90 tablet 4   fluticasone furoate-vilanterol (BREO ELLIPTA) 100-25 MCG/ACT AEPB Inhale 1 puff into the lungs daily. 60 each 4   gabapentin (NEURONTIN) 300 MG capsule Take 1 capsule (300 mg total) by mouth 2 (two) times daily. 180 capsule 4   glucose blood (ONETOUCH ULTRA) test strip TEST BLOOD SUGAR 5 TIMES A DAY 300 each 4   icosapent Ethyl (VASCEPA) 1 g capsule Take 1  capsule (1 g total) by mouth 2 (two) times daily. 180 capsule 4   Insulin Degludec (TRESIBA) 100 UNIT/ML SOLN Inject 40 Units into the skin 2 (two) times daily. (Patient taking differently: Inject 45 Units into the skin 2 (two) times daily.) 30 mL 4   Insulin Lispro Junior KwikPen (HUMALOG JR) 100 UNIT/ML KwikPen Inject 42 Units into the skin 3 (three) times daily before meals.     isosorbide mononitrate (IMDUR) 30 MG 24 hr tablet Take 1 tablet (30 mg total) by mouth daily. 30 tablet 1   lisinopril (ZESTRIL) 20 MG tablet Take 1 tablet (20 mg total) by mouth daily. 90 tablet 4   montelukast (SINGULAIR) 10 MG tablet Take 1 tablet (10 mg total) by mouth at bedtime. 90 tablet 4   mupirocin ointment (BACTROBAN) 2 % Apply 1 Application topically 2 (two) times daily. 22 g 3   OneTouch Delica Lancets 33G MISC USE AS DIRECTED 300 each 3   pantoprazole (PROTONIX) 40 MG tablet Take 1 tablet (40 mg total) by mouth daily. 30 tablet 1   rosuvastatin (CRESTOR) 40 MG tablet Take 1 tablet (40 mg total) by mouth daily. 90 tablet 4   sitaGLIPtin (JANUVIA) 100 MG tablet Take 1 tablet (100 mg total) by mouth daily. 90 tablet 4   triamcinolone cream (KENALOG) 0.1 % Apply 1 Application topically 2 (two) times daily. (Patient taking differently: Apply 1 Application topically 2 (two) times daily. Arms and legs) 30 g 1   valACYclovir (VALTREX) 500 MG tablet Take 500 mg by mouth 3 (three) times daily.     Vitamin D, Ergocalciferol, (DRISDOL) 1.25 MG (50000 UNIT) CAPS capsule Take 50,000 Units by mouth once a week.     EPINEPHrine 0.3 mg/0.3 mL IJ SOAJ injection INJECT 1 SYRINGE INTO OUTER THIGH ONCE AS NEEDED FOR SEVERE ALLERGIC REACTION. Strength 0.3 MG/0.3ML (Patient not taking: Reported on 02/05/2023) 2 each 1   HYDROcodone-acetaminophen (NORCO/VICODIN) 5-325 MG tablet Take 1 tablet by mouth every 6 (six) hours as needed for moderate pain. (Patient not taking: Reported on 02/05/2023)     Misc. Devices (PULSE OXIMETER FOR  FINGER) MISC To check O2 saturations once daily with asthma and document + check if any shortness of breath (Patient not taking: Reported on 02/05/2023) 1 each 1   Misc. Devices (PULSE OXIMETER) MISC 1 each by Does not apply route every other day. (Patient not taking: Reported on 02/05/2023) 1 each 0   nitroGLYCERIN (NITROSTAT) 0.4 MG SL tablet Place 1 tablet (0.4 mg total) under the tongue every 5 (five) minutes as needed for chest pain. (Patient not taking: Reported on 02/05/2023) 50  tablet 3   No current facility-administered medications for this visit.    Allergies:   Sulfa antibiotics, Bee venom, Morphine, and Other    Social History:   reports that he has never smoked. He has never been exposed to tobacco smoke. He has never used smokeless tobacco. He reports that he does not drink alcohol and does not use drugs.   Family History:  family history includes Cancer in his maternal grandfather, maternal grandmother, mother, and paternal grandfather; Diabetes in his father; Heart disease in his father.    ROS:     Review of Systems  Constitutional: Negative.   HENT: Negative.    Eyes: Negative.   Respiratory: Negative.    Cardiovascular:  Positive for chest pain.  Gastrointestinal: Negative.   Genitourinary: Negative.   Musculoskeletal: Negative.   Skin: Negative.   Neurological: Negative.   Endo/Heme/Allergies: Negative.   Psychiatric/Behavioral: Negative.    All other systems reviewed and are negative.     All other systems are reviewed and negative.    PHYSICAL EXAM: VS:  BP 138/84   Pulse (!) 57   Ht 5\' 4"  (1.626 m)   Wt 172 lb 3.2 oz (78.1 kg)   SpO2 95%   BMI 29.56 kg/m  , BMI Body mass index is 29.56 kg/m. Last weight:  Wt Readings from Last 3 Encounters:  02/05/23 172 lb 3.2 oz (78.1 kg)  01/15/23 167 lb 6.4 oz (75.9 kg)  01/01/23 169 lb 3.2 oz (76.7 kg)     Physical Exam Vitals reviewed.  Constitutional:      Appearance: Normal appearance. He is  normal weight.  HENT:     Head: Normocephalic.     Nose: Nose normal.     Mouth/Throat:     Mouth: Mucous membranes are moist.  Eyes:     Pupils: Pupils are equal, round, and reactive to light.  Cardiovascular:     Rate and Rhythm: Normal rate and regular rhythm.     Pulses: Normal pulses.     Heart sounds: Normal heart sounds.  Pulmonary:     Effort: Pulmonary effort is normal.  Abdominal:     General: Abdomen is flat. Bowel sounds are normal.  Musculoskeletal:        General: Normal range of motion.     Cervical back: Normal range of motion.  Skin:    General: Skin is warm.  Neurological:     General: No focal deficit present.     Mental Status: He is alert.  Psychiatric:        Mood and Affect: Mood normal.       EKG:   Recent Labs: 10/11/2022: Magnesium 2.3 10/31/2022: Hemoglobin 12.8; Platelets 137 01/28/2023: ALT 45; BUN 13; Creatinine, Ser 1.03; Potassium 4.5; Sodium 136; TSH 1.040    Lipid Panel    Component Value Date/Time   CHOL 210 (H) 01/01/2023 1422   CHOL 136 12/02/2018 1408   TRIG 405 (H) 01/01/2023 1422   TRIG 145 12/02/2018 1408   HDL 36 (L) 01/01/2023 1422   CHOLHDL 5.3 03/03/2019 0447   VLDL 44 (H) 03/03/2019 0447   VLDL 229 (H) 12/02/2018 1408   LDLCALC 105 (H) 01/01/2023 1422      Other studies Reviewed: Additional studies/ records that were reviewed today include:  Review of the above records demonstrates:       No data to display            ASSESSMENT AND PLAN:  ICD-10-CM   1. Atherosclerosis of both carotid arteries  I65.23 pantoprazole (PROTONIX) 40 MG tablet    CT CORONARY MORPH W/CTA COR W/SCORE W/CA W/CM &/OR WO/CM    isosorbide mononitrate (IMDUR) 30 MG 24 hr tablet    2. Hypertension associated with diabetes (HCC)  E11.59 pantoprazole (PROTONIX) 40 MG tablet   I15.2 CT CORONARY MORPH W/CTA COR W/SCORE W/CA W/CM &/OR WO/CM    isosorbide mononitrate (IMDUR) 30 MG 24 hr tablet    3. Other chest pain  R07.89  pantoprazole (PROTONIX) 40 MG tablet    CT CORONARY MORPH W/CTA COR W/SCORE W/CA W/CM &/OR WO/CM    isosorbide mononitrate (IMDUR) 30 MG 24 hr tablet   Set up CCTA as stress test normal and echo has normal LVEF. He is holding chest  with chest pain,    4. Uncontrolled type 2 diabetes mellitus with hyperglycemia (HCC)  E11.65 pantoprazole (PROTONIX) 40 MG tablet    CT CORONARY MORPH W/CTA COR W/SCORE W/CA W/CM &/OR WO/CM    isosorbide mononitrate (IMDUR) 30 MG 24 hr tablet    5. Hyperlipidemia associated with type 2 diabetes mellitus (HCC)  E11.69 pantoprazole (PROTONIX) 40 MG tablet   E78.5 CT CORONARY MORPH W/CTA COR W/SCORE W/CA W/CM &/OR WO/CM    isosorbide mononitrate (IMDUR) 30 MG 24 hr tablet    6. Gastroesophageal reflux disease without esophagitis  K21.9 pantoprazole (PROTONIX) 40 MG tablet    CT CORONARY MORPH W/CTA COR W/SCORE W/CA W/CM &/OR WO/CM    isosorbide mononitrate (IMDUR) 30 MG 24 hr tablet    7. Chest pain due to myocardial ischemia, unspecified ischemic chest pain type  I25.9 pantoprazole (PROTONIX) 40 MG tablet    CT CORONARY MORPH W/CTA COR W/SCORE W/CA W/CM &/OR WO/CM    isosorbide mononitrate (IMDUR) 30 MG 24 hr tablet   Will start omdur, and protonix, while waiting foc CCTA, as creat is normal, and Heart rate 57       Problem List Items Addressed This Visit       Cardiovascular and Mediastinum   Hypertension associated with diabetes (HCC)   Relevant Medications   pantoprazole (PROTONIX) 40 MG tablet   isosorbide mononitrate (IMDUR) 30 MG 24 hr tablet   Other Relevant Orders   CT CORONARY MORPH W/CTA COR W/SCORE W/CA W/CM &/OR WO/CM   Atherosclerosis of both carotid arteries - Primary   Relevant Medications   pantoprazole (PROTONIX) 40 MG tablet   isosorbide mononitrate (IMDUR) 30 MG 24 hr tablet   Other Relevant Orders   CT CORONARY MORPH W/CTA COR W/SCORE W/CA W/CM &/OR WO/CM     Digestive   GERD (gastroesophageal reflux disease)   Relevant  Medications   pantoprazole (PROTONIX) 40 MG tablet   isosorbide mononitrate (IMDUR) 30 MG 24 hr tablet   Other Relevant Orders   CT CORONARY MORPH W/CTA COR W/SCORE W/CA W/CM &/OR WO/CM     Endocrine   Hyperlipidemia associated with type 2 diabetes mellitus (HCC)   Relevant Medications   pantoprazole (PROTONIX) 40 MG tablet   isosorbide mononitrate (IMDUR) 30 MG 24 hr tablet   Other Relevant Orders   CT CORONARY MORPH W/CTA COR W/SCORE W/CA W/CM &/OR WO/CM   Uncontrolled type 2 diabetes mellitus with hyperglycemia (HCC)   Relevant Medications   pantoprazole (PROTONIX) 40 MG tablet   isosorbide mononitrate (IMDUR) 30 MG 24 hr tablet   Other Relevant Orders   CT CORONARY MORPH W/CTA COR W/SCORE W/CA W/CM &/OR WO/CM   Other Visit  Diagnoses     Other chest pain       Set up CCTA as stress test normal and echo has normal LVEF. He is holding chest  with chest pain,   Relevant Medications   pantoprazole (PROTONIX) 40 MG tablet   isosorbide mononitrate (IMDUR) 30 MG 24 hr tablet   Other Relevant Orders   CT CORONARY MORPH W/CTA COR W/SCORE W/CA W/CM &/OR WO/CM   Chest pain due to myocardial ischemia, unspecified ischemic chest pain type       Will start omdur, and protonix, while waiting foc CCTA, as creat is normal, and Heart rate 57   Relevant Medications   pantoprazole (PROTONIX) 40 MG tablet   isosorbide mononitrate (IMDUR) 30 MG 24 hr tablet   Other Relevant Orders   CT CORONARY MORPH W/CTA COR W/SCORE W/CA W/CM &/OR WO/CM          Disposition:   Return in about 2 weeks (around 02/19/2023) for CCTA and f/u.    Total time spent: 30 minutes  Signed,  Adrian Blackwater, MD  02/05/2023 1:16 PM    [facility]

## 2023-02-06 ENCOUNTER — Ambulatory Visit: Payer: Self-pay | Admitting: *Deleted

## 2023-02-06 ENCOUNTER — Other Ambulatory Visit: Payer: Medicare Other

## 2023-02-06 DIAGNOSIS — E1165 Type 2 diabetes mellitus with hyperglycemia: Secondary | ICD-10-CM | POA: Diagnosis not present

## 2023-02-06 DIAGNOSIS — I10 Essential (primary) hypertension: Secondary | ICD-10-CM | POA: Diagnosis not present

## 2023-02-06 NOTE — Patient Outreach (Signed)
Care Coordination   Follow Up Visit Note   02/06/2023 Name: [name]. MRN: [id number] DOB: 03/04/1961  Alex Dam. is a 62 y.o. year old male who sees [name], Alex Dandy T, NP for primary care. I spoke with  Alex Dam. by phone today.  What matters to the patients health and wellness today?  Patient report having a "spot" on his arm that is causing concern. Denies that it is a mole or skin tag, but state it's swollen.   He will monitor and call office for assessment if needed.   Goals Addressed             This Visit's Progress    Effective management of chronic medical conditions       Interventions Today    Flowsheet Row Most Recent Value  Chronic Disease   Chronic disease during today's visit Diabetes, Other, Hypertension (HTN)  [HLD]  General Interventions   General Interventions Discussed/Reviewed General Interventions Reviewed, Doctor Visits, Labs, Durable Medical Equipment (DME)  Labs Hgb A1c every 3 months  [Most recent 11.4]  Doctor Visits Discussed/Reviewed Doctor Visits Reviewed, PCP, Specialist  [upcoming with GI on 11/25, Cardiac CT on 12/10, Cardiology on 12/13, and PCP on 1/6]  Durable Medical Equipment (DME) Glucomoter  PCP/Specialist Visits Compliance with follow-up visit  [Report seen by endocrine today]  Exercise Interventions   Exercise Discussed/Reviewed Physical Activity, Exercise Reviewed  Physical Activity Discussed/Reviewed Physical Activity Reviewed  [Encouraged to increase activity/exercise]  Education Interventions   Education Provided Provided Education, Provided Printed Education  Provided Verbal Education On Nutrition, Labs, Blood Sugar Monitoring, Medication, When to see the doctor  [Blood sugar today was 312, monitors daily. Will change G7 patch in order to monitor blood sugar more closely]  Labs Reviewed Hgb A1c  [Advised that goal is 7]  Nutrition Interventions   Nutrition Discussed/Reviewed Nutrition  Reviewed, Decreasing sugar intake, Adding fruits and vegetables, Carbohydrate meal planning  [Reminded to increase water and decrease sugars and carbohydrates]           COMPLETED: RNCM Care Management  Expected Outcome:  Monitor, Self-Manage and Reduce Symptoms of Diabetes   On track    Current Barriers:  Knowledge Deficits related to the need to check blood sugars and keep blood sugars in normal range Care Coordination needs related to the importance of monitoring blood sugars, eating healthy, maintaining good control of DM in a patient with DM Chronic Disease Management support and education needs related to effective management of DM Literacy barriers  Lab Results  Component Value Date   HGBA1C 11.4 (H) 01/28/2023    Planned Interventions: Provided education to patient about basic DM disease process. The patient needs ongoing support and education with effective management of DM. The patient wife states that he continues to excessively drink soda and does not like to drink water. Alternative sugar free options provided to patient at this time.  He has been referred to a new endocrinologist and will be having an upcoming appointment.  Reviewed medications with patient and discussed importance of medication adherence. The patient is compliant with medications. He is taking his medications. He is working with the endocrinologist. Reviewed prescribed diet with patient heart healthy/ADA. The patient is doing better with ADA and options that will help with maintaining healthy blood sugars but having a hard time refraining from salt ; Counseled on importance of regular laboratory monitoring as prescribed. Review of goals of A1C and the patient is hoping his  next A1C is much better;        Discussed plans with patient for ongoing care management follow up and provided patient with direct contact information for care management team;      Provided patient with written educational materials related  to hypo and hyperglycemia and importance of correct treatment. The patient denies any new falls.  Education on safety and monitoring for changes in the way he feels. Denies and lows. Highest he has seen is >300.        Reviewed scheduled/upcoming provider appointments including:01-01-2023 at 120 pm, has a letter from his insurance and instructed him to take to his upcoming visit with the pcp.  Advised patient, providing education and rationale, to check cbg before meals and at bedtime and when you have symptoms of low or high blood sugar and record. CBG this morning 241. call provider for findings outside established parameters;       Review of patient status, including review of consultants reports, relevant laboratory and other test results, and medications completed;       Advised patient to discuss changes in DM, questions, or concerns with provider;     Sees eye specialist in January. Is possibly going to have new surgery to help vision. His vision is stable  Screening for signs and symptoms of depression related to chronic disease state;        Assessed social determinant of health barriers;         Symptom Management: Take medications as prescribed   Attend all scheduled provider appointments Call provider office for new concerns or questions  call the Suicide and Crisis Lifeline: 988 call the [place] National Suicide Prevention Lifeline: 816 107 5809 or TTY: (281) 411-1595 TTY 203-180-4121) to talk to a trained counselor call 1-800-273-TALK (toll free, 24 hour hotline) if experiencing a Mental Health or Behavioral Health Crisis  keep appointment with eye doctor check feet daily for cuts, sores or redness trim toenails straight across manage portion size wash and dry feet carefully every day wear comfortable, cotton socks wear comfortable, well-fitting shoes  Follow Up Plan: Telephone follow up appointment with care management team member scheduled for:02-06-2023 at 145 pm        COMPLETED: RNCM Care Management  Expected Outcome:  Monitor, Self-Manage and Reduce Symptoms of: Asthma   On track    Current Barriers:  Knowledge Deficits related to triggers that can cause exacerbation of his asthma Chronic Disease Management support and education needs related to effective management of Asthma  Planned Interventions: Provided patient with basic written and verbal Asthma education on self care/management/and exacerbation prevention. Denies any issues with his breathing. The patient states his breathing is stable. Monitoring for changes in his breathing related to allergies. Denies any new concerns at this time. The patient is stable and doing well with his COPD. States that he has an increased work of breathing when working outside but recovers well once back inside. Advised patient to track and manage Asthma triggers. Review of the weather changes, being aware of people with infections, and monitoring for changes in his breathing;  Provided written and verbal instructions on pursed lip breathing and utilized returned demonstration as teach back; Provided instruction about proper use of medications used for management of Asthma including inhalers; Advised patient to self assesses Asthma action plan zone and make appointment with provider if in the yellow zone for 48 hours without improvement; Advised patient to engage in light exercise as tolerated 3-5 days a week to aid in  the the management of Asthma; Provided education about and advised patient to utilize infection prevention strategies to reduce risk of respiratory infection. Education and support given; Discussed the importance of adequate rest and management of fatigue with Asthma; Screening for signs and symptoms of depression related to chronic disease state;  Assessed social determinant of health barriers;    Symptom Management: Take medications as prescribed   Attend all scheduled provider appointments Call provider  office for new concerns or questions  call the Suicide and Crisis Lifeline: 988 call the [place] National Suicide Prevention Lifeline: (912) 041-6888 or TTY: 305-015-7886 TTY 516 013 9969) to talk to a trained counselor call 1-800-273-TALK (toll free, 24 hour hotline) if experiencing a Mental Health or Behavioral Health Crisis   Follow Up Plan: Telephone follow up appointment with care management team member scheduled for: 02-06-2023 at 145 pm       COMPLETED: RNCM Care Management Expected Outcome:  Monitor, Self-Manage, and Reduce Symptoms of Hypertension       Current Barriers:  Knowledge Deficits related to the importance for following a heart healthy/ADA diet and the need to restrict sodium in dietary habits  Chronic Disease Management support and education needs related to effective management of HTN  BP Readings from Last 3 Encounters:  02/05/23 138/84  01/15/23 138/78  01/01/23 136/80     Planned Interventions: Evaluation of current treatment plan related to hypertension self management and patient's adherence to plan as established by provider. The patient stated that he has not been checking his blood pressure due to not having a bp cuff at home. Patient wife states she has one but does not have batteries in her machine at this current time.He is scheduled for NST on 01-22-2023 & will have his Echocardiogram on 01-29-2023.  Education provided. Will continue to monitor for changes and new needs.  Provided education to patient re: stroke prevention, s/s of heart attack and stroke; Reviewed prescribed diet heart healthy/ADA diet. Spouse states that she has replaced seasonings in the home with salt-free version but patient has continued to hide salt shaker behind the microwave and uses it behind her back. She states she continues to try and help in to decrease his salt intake. She says that it is for his own good. Education and support given.  Reviewed medications with patient and discussed  importance of compliance. The patient states compliance with his medications. Works with the pharm D for effective management of medications;  Discussed plans with patient for ongoing care management follow up and provided patient with direct contact information for care management team; Advised patient, providing education and rationale, to monitor blood pressure daily and record, calling PCP for findings outside established parameters;  Reviewed scheduled/upcoming provider appointments including: PCP: 01-15-2023 for follow up for cellulitis. Advised patient to discuss changes in blood pressures or heart health with provider; Provided education on prescribed diet heart healthy/ADA diet. Education and support given. Review of heart healthy/ADA choices ;  Discussed complications of poorly controlled blood pressure such as heart disease, stroke, circulatory complications, vision complications, kidney impairment, sexual dysfunction;  Screening for signs and symptoms of depression related to chronic disease state;  Assessed social determinant of health barriers;   Symptom Management: Take medications as prescribed   Attend all scheduled provider appointments Call provider office for new concerns or questions  call the Suicide and Crisis Lifeline: 988 call the [place] National Suicide Prevention Lifeline: 4134244474 or TTY: 212-677-6623 TTY (518) 052-6998) to talk to a trained counselor call 1-800-273-TALK (toll free,  24 hour hotline) if experiencing a Mental Health or Behavioral Health Crisis  check blood pressure 3 times per week learn about high blood pressure keep a blood pressure log take blood pressure log to all doctor appointments call doctor for signs and symptoms of high blood pressure develop an action plan for high blood pressure keep all doctor appointments take medications for blood pressure exactly as prescribed report new symptoms to your doctor  Follow Up Plan: Telephone follow  up appointment with care management team member scheduled for: 02-06-2023 at 145 pm          SDOH assessments and interventions completed:  No     Care Coordination Interventions:  Yes, provided   Follow up plan: Follow up call scheduled for 12/16    Encounter Outcome:  Patient Visit Completed   Rodney Langton, RN, MSN, CCM [place]  [facility], [facility] RN Care Coordinator Direct Dial: [phone] / Main [phone] Fax [phone] Email: [email or web address]: [email or web address]

## 2023-02-06 NOTE — Patient Instructions (Signed)
Visit Information  Thank you for taking time to visit with me today. Please don't hesitate to contact me if I can be of assistance to you before our next scheduled telephone appointment.  Following are the goals we discussed today:  Call office for appointment for arm. Change G7 patch so you can monitor blood sugars better. Increase water intake  Decrease sugar and breads  Our next appointment is by telephone on 12/16  Please call the care guide team at [phone] if you need to cancel or reschedule your appointment.   Please call the Suicide and Crisis Lifeline: 988 call the [place] National Suicide Prevention Lifeline: (231)348-8068 or TTY: 272-456-8117 TTY 971-822-3184) to talk to a trained counselor call 1-800-273-TALK (toll free, 24 hour hotline) call 911 if you are experiencing a Mental Health or Behavioral Health Crisis or need someone to talk to.  The patient verbalized understanding of instructions, educational materials, and care plan provided today and agreed to receive a mailed copy of patient instructions, educational materials, and care plan.   The patient has been provided with contact information for the care management team and has been advised to call with any health related questions or concerns.   Rodney Langton, RN, MSN, CCM [facility], [facility] RN Care Coordinator Direct Dial: [phone] / Main [phone] Fax [phone] Email: [email or web address] Website: [email or web address]

## 2023-02-11 ENCOUNTER — Telehealth: Payer: Self-pay | Admitting: *Deleted

## 2023-02-11 ENCOUNTER — Ambulatory Visit (INDEPENDENT_AMBULATORY_CARE_PROVIDER_SITE_OTHER): Payer: Medicare Other | Admitting: Gastroenterology

## 2023-02-11 ENCOUNTER — Encounter: Payer: Self-pay | Admitting: Gastroenterology

## 2023-02-11 VITALS — BP 159/96 | HR 56 | Temp 97.8°F | Ht 64.0 in | Wt 170.2 lb

## 2023-02-11 DIAGNOSIS — D696 Thrombocytopenia, unspecified: Secondary | ICD-10-CM

## 2023-02-11 DIAGNOSIS — R7989 Other specified abnormal findings of blood chemistry: Secondary | ICD-10-CM | POA: Diagnosis not present

## 2023-02-11 DIAGNOSIS — K760 Fatty (change of) liver, not elsewhere classified: Secondary | ICD-10-CM | POA: Diagnosis not present

## 2023-02-11 NOTE — Patient Outreach (Signed)
  Care Coordination   Follow Up Visit Note   02/11/2023 Name: [name]. MRN: [id number] DOB: [birth year]  Alex Dam. is a 62 y.o. year old male who sees [name], Corrie Dandy T, NP for primary care. I spoke with [name], wife of [name]. by phone today.  What matters to the patients health and wellness today?  Call received from wife stating patient was diagnosed today with celiac disease, asking for education on diet and management of condition.    Goals Addressed             This Visit's Progress    Effective management of chronic medical conditions   On track    Interventions Today    Flowsheet Row Most Recent Value  Chronic Disease   Chronic disease during today's visit Other, Diabetes  [newly diagnosed celiac disease]  General Interventions   General Interventions Discussed/Reviewed General Interventions Reviewed, Doctor Visits  Doctor Visits Discussed/Reviewed Doctor Visits Reviewed, Specialist  [RUQ ultrasound scheduled for 12/4]  PCP/Specialist Visits Compliance with follow-up visit  [GI visit done today]  Education Interventions   Education Provided Provided Education  Provided Verbal Education On Nutrition  Nutrition Interventions   Nutrition Discussed/Reviewed Nutrition Reviewed  [education mailed on celiac disease]              SDOH assessments and interventions completed:  No     Care Coordination Interventions:  Yes, provided   Follow up plan: Follow up call scheduled for 12/16    Encounter Outcome:  Patient Visit Completed   Rodney Langton, RN, MSN, CCM [place]  [facility], [facility] RN Care Coordinator Direct Dial: [phone] / Main [phone] Fax [phone] Email: [email or web address] Website: [email or web address]

## 2023-02-11 NOTE — Progress Notes (Signed)
Arlyss Repress, MD [address]  [address]  [place], [place] [address]  Main: [phone]  Fax: [phone]    Gastroenterology Consultation  Referring Provider:     Marjie Skiff, NP Primary Care Physician:  Marjie Skiff, NP Primary Gastroenterologist:  Dr. Arlyss Repress Reason for Consultation: Elevated LFTs, fatty liver        HPI:   Alex Deck. is a 62 y.o. male referred by  Marjie Skiff, NP  for consultation & management of Cologuard positive.  Patient has history of metabolic syndrome, fatty liver is referred because of Cologuard positive test from 06/09/2020.  Patient is accompanied by his wife today, she does report that patient is not compliant with diabetic diet.  His hemoglobin A1c is 8.6.  And he is not following low-sodium diet.  Patient denies any GI symptoms today.  He has history of reflux for which he takes omeprazole 20 mg daily.  Follow-up visit 05/29/2022 Patient is here for follow-up of fatty liver.  He is found to have severe hypertriglyceridemia 1384 in November 2023.  Most recently 469 in January 2024.  His diabetes is not well-controlled.  Patient underwent laparoscopic cholecystectomy for chronic right upper quadrant pain.  Pathology revealed chronic cholecystitis with cholelithiasis only.  Patient is having hard time following healthy diet even though he knows that his diabetes is not under control and he has fatty liver.  His most recent LFTs revealed persistently elevated alkaline phosphatase and mildly elevated ALT.  He also has  vitamin D deficiency  Follow-up visit 02/11/2023 [name] is here for follow-up of elevated LFTs.  His diabetes is poorly controlled, most recent hemoglobin A1c is 11.4.  He has mildly elevated LFTs.  NSAIDs: None  Antiplts/Anticoagulants/Anti thrombotics: Aspirin and Plavix for history of CVA GI Procedures:   - Two 5 mm polyps in the descending colon and in the cecum, removed with a cold  snare. Resected and retrieved. - Non- bleeding external hemorrhoids. DIAGNOSIS:  A. COLON POLYP, CECUM; COLD SNARE:  - POLYPOID FRAGMENTS OF MILDLY EDEMATOUS, FOCALLY INFLAMED COLONIC  MUCOSA WITH REACTIVE LYMPHOID AGGREGATES, BOWEL PREP EFFECT IS A  DIAGNOSTIC CONSIDERATION.  - NEGATIVE FOR DYSPLASIA AND MALIGNANCY.  - DEEPER SECTIONS EXAMINED.   B.  COLON POLYP, DESCENDING; COLD SNARE:  - HYPERPLASTIC POLYP.  - NEGATIVE FOR DYSPLASIA AND MALIGNANCY.   Upper endoscopy 12/27/2016 - Normal duodenal bulb and second portion of the duodenum. - Erythematous mucosa in the gastric body. - Multiple gastric polyps. Biopsied. - A small amount of food (residue) in the stomach. - Esophagogastric landmarks identified. - Normal upper third of esophagus, middle third of esophagus, lower third of esophagus and gastroesophageal Junction. DIAGNOSIS:  A. STOMACH; COLD BIOPSY:  - PROTON PUMP INHIBITOR EFFECT.  - NEGATIVE FOR INTESTINAL METAPLASIA, DYSPLASIA, AND MALIGNANCY.  - NEGATIVE FOR HELICOBACTER PYLORI IN HEMATOXYLIN AND EOSIN SECTIONS.   B. STOMACH POLYPS, BODY; COLD BIOPSY:  - FUNDIC GLAND POLYPS, MULTIPLE FRAGMENTS.  - NEGATIVE FOR DYSPLASIA AND MALIGNANCY.   Past Medical History:  Diagnosis Date   Arrhythmia    Asthma    CCC (chronic calculous cholecystitis)    COPD (chronic obstructive pulmonary disease) (HCC)    Cyst of kidney, acquired    Diabetes mellitus without complication (HCC) 2013   type 2   Diverticulosis    Edentulous    Fatty liver    Gallbladder polyp    GERD (gastroesophageal reflux disease)    Heart  murmur    History of chicken pox    History of measles as a child    History of PKU    Hyperlipidemia    Hypertension    IBS (irritable bowel syndrome)    Irregular heart beat    Mentally challenged    Pancreatitis    PONV (postoperative nausea and vomiting)    Stroke (HCC) 03/02/2019   vision issues - right eye/right sided weakness   Vision impairment     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    Past Surgical History:  Procedure Laterality Date   Cardiac Catherization     [facility]   CARDIAC CATHETERIZATION     [facility]   CATARACT EXTRACTION W/PHACO Left 06/05/2021   Procedure: CATARACT EXTRACTION PHACO AND INTRAOCULAR LENS PLACEMENT (IOC) LEFT DIABETIC 3.75 00:28.0;  Surgeon: Nevada Crane, MD;  Location: [facility];  Service: Ophthalmology;  Laterality: Left;  Diabetic   CATARACT EXTRACTION W/PHACO Right 07/10/2021   Procedure: CATARACT EXTRACTION PHACO AND INTRAOCULAR LENS PLACEMENT (IOC) RIGHT DIABETIC;  Surgeon: Nevada Crane, MD;  Location: [facility];  Service: Ophthalmology;  Laterality: Right;  Diabetic 2.66 00:24.4   COLONOSCOPY     COLONOSCOPY WITH PROPOFOL N/A 08/10/2020   Procedure: COLONOSCOPY WITH PROPOFOL;  Surgeon: Toney Reil, MD;  Location: [facility];  Service: Gastroenterology;  Laterality: N/A;  Has ankle monitor; (Parole officer "Ms Ward Givens needs a time before Monday [phone]" - per patient's wife)   ESOPHAGOGASTRODUODENOSCOPY (EGD) WITH PROPOFOL N/A 12/27/2016   Procedure: ESOPHAGOGASTRODUODENOSCOPY (EGD) WITH PROPOFOL;  Surgeon: Toney Reil, MD;  Location: [facility] END[facility];  Service: Gastroenterology;  Laterality: N/A;   HEMORRHOID SURGERY     KNEE SURGERY     cyst removed   Ligament Removal Left    of left thumb: dr. Alberteen Spindle   ligament removal  of left thumb     Dr. Alberteen Spindle   NM GATED MYOCARDIAL STUDY (ARMX HX)  06/23/2014   Paraschos. Normal   WISDOM TOOTH EXTRACTION      Current Outpatient Medications:    acetaminophen (TYLENOL) 500 MG tablet, Take 500 mg by mouth every 6 (six) hours as needed., Disp: , Rfl:    albuterol (VENTOLIN HFA) 108 (90 Base) MCG/ACT inhaler, Inhale 2 puffs into the lungs every 6 (six) hours as needed., Disp: 18 g, Rfl: 0   aspirin 81 MG EC tablet, Take 1 tablet (81 mg total) by mouth daily., Disp: 180 tablet, Rfl: 0   clopidogrel (PLAVIX) 75  MG tablet, Take 1 tablet (75 mg total) by mouth daily., Disp: 90 tablet, Rfl: 4   empagliflozin (JARDIANCE) 25 MG TABS tablet, Take 1 tablet (25 mg total) by mouth daily., Disp: 90 tablet, Rfl: 4   EPINEPHrine 0.3 mg/0.3 mL IJ SOAJ injection, INJECT 1 SYRINGE INTO OUTER THIGH ONCE AS NEEDED FOR SEVERE ALLERGIC REACTION. Strength 0.3 MG/0.3ML, Disp: 2 each, Rfl: 1   fenofibrate 54 MG tablet, Take 1 tablet (54 mg total) by mouth daily., Disp: 90 tablet, Rfl: 4   fluticasone furoate-vilanterol (BREO ELLIPTA) 100-25 MCG/ACT AEPB, Inhale 1 puff into the lungs daily., Disp: 60 each, Rfl: 4   gabapentin (NEURONTIN) 300 MG capsule, Take 1 capsule (300 mg total) by mouth 2 (two) times daily., Disp: 180 capsule, Rfl: 4   glucose blood (ONETOUCH ULTRA) test strip, TEST BLOOD SUGAR 5 TIMES A DAY, Disp: 300 each, Rfl: 4   HYDROcodone-acetaminophen (NORCO/VICODIN) 5-325 MG tablet, Take 1 tablet by mouth every 6 (six) hours as  needed for moderate pain (pain score 4-6)., Disp: , Rfl:    icosapent Ethyl (VASCEPA) 1 g capsule, Take 1 capsule (1 g total) by mouth 2 (two) times daily., Disp: 180 capsule, Rfl: 4   Insulin Degludec (TRESIBA) 100 UNIT/ML SOLN, Inject 40 Units into the skin 2 (two) times daily. (Patient taking differently: Inject 45 Units into the skin 2 (two) times daily.), Disp: 30 mL, Rfl: 4   Insulin Lispro Junior KwikPen (HUMALOG JR) 100 UNIT/ML KwikPen, Inject 42 Units into the skin 3 (three) times daily before meals., Disp: , Rfl:    isosorbide mononitrate (IMDUR) 30 MG 24 hr tablet, Take 1 tablet (30 mg total) by mouth daily., Disp: 30 tablet, Rfl: 1   lisinopril (ZESTRIL) 20 MG tablet, Take 1 tablet (20 mg total) by mouth daily., Disp: 90 tablet, Rfl: 4   Misc. Devices (PULSE OXIMETER FOR FINGER) MISC, To check O2 saturations once daily with asthma and document + check if any shortness of breath, Disp: 1 each, Rfl: 1   Misc. Devices (PULSE OXIMETER) MISC, 1 each by Does not apply route every other  day., Disp: 1 each, Rfl: 0   montelukast (SINGULAIR) 10 MG tablet, Take 1 tablet (10 mg total) by mouth at bedtime., Disp: 90 tablet, Rfl: 4   mupirocin ointment (BACTROBAN) 2 %, Apply 1 Application topically 2 (two) times daily., Disp: 22 g, Rfl: 3   nitroGLYCERIN (NITROSTAT) 0.4 MG SL tablet, Place 1 tablet (0.4 mg total) under the tongue every 5 (five) minutes as needed for chest pain., Disp: 50 tablet, Rfl: 3   OneTouch Delica Lancets 33G MISC, USE AS DIRECTED, Disp: 300 each, Rfl: 3   pantoprazole (PROTONIX) 40 MG tablet, Take 1 tablet (40 mg total) by mouth daily., Disp: 30 tablet, Rfl: 1   rosuvastatin (CRESTOR) 40 MG tablet, Take 1 tablet (40 mg total) by mouth daily., Disp: 90 tablet, Rfl: 4   sitaGLIPtin (JANUVIA) 100 MG tablet, Take 1 tablet (100 mg total) by mouth daily., Disp: 90 tablet, Rfl: 4   triamcinolone cream (KENALOG) 0.1 %, Apply 1 Application topically 2 (two) times daily. (Patient taking differently: Apply 1 Application topically 2 (two) times daily. Arms and legs), Disp: 30 g, Rfl: 1   valACYclovir (VALTREX) 500 MG tablet, Take 500 mg by mouth 3 (three) times daily., Disp: , Rfl:    Vitamin D, Ergocalciferol, (DRISDOL) 1.25 MG (50000 UNIT) CAPS capsule, Take 50,000 Units by mouth once a week., Disp: , Rfl:    Family History  Problem Relation Age of Onset   Cancer Mother        throat   Diabetes Father    Heart disease Father    Cancer Maternal Grandmother    Cancer Maternal Grandfather        pancreatic   Cancer Paternal Grandfather      Social History   Tobacco Use   Smoking status: Never    Passive exposure: Never   Smokeless tobacco: Never  Vaping Use   Vaping status: Never Used  Substance Use Topics   Alcohol use: No    Alcohol/week: 0.0 standard drinks of alcohol   Drug use: No    Allergies as of 02/11/2023 - Review Complete 02/11/2023  Allergen Reaction Noted   Sulfa antibiotics Shortness Of Breath and Nausea And Vomiting 01/04/2014   Bee venom  Hives and Swelling 10/22/2014   Morphine Nausea And Vomiting 03/30/2022   Other  10/09/2014    Review of Systems:  All systems reviewed and negative except where noted in HPI.   Physical Exam:  BP (!) 159/96 (BP Location: Left Arm, Patient Position: Sitting, Cuff Size: Normal)   Pulse (!) 56   Temp 97.8 F (36.6 C) (Oral)   Ht 5\' 4"  (1.626 m)   Wt 170 lb 4 oz (77.2 kg)   BMI 29.22 kg/m  No LMP for male patient.  General:   Alert,  Well-developed, well-nourished, pleasant and cooperative in NAD Head:  Normocephalic and atraumatic. Eyes:  Sclera clear, no icterus.   Conjunctiva pink. Ears:  Normal auditory acuity. Nose:  No deformity, discharge, or lesions. Mouth:  No deformity or lesions,oropharynx pink & moist. Neck:  Supple; no masses or thyromegaly. Lungs:  Respirations even and unlabored.  Clear throughout to auscultation.   No wheezes, crackles, or rhonchi. No acute distress. Heart:  Regular rate and rhythm; no murmurs, clicks, rubs, or gallops. Abdomen:  Normal bowel sounds. Soft, obese, non-tender and non-distended without masses, hepatosplenomegaly or hernias noted.  No guarding or rebound tenderness.   Rectal: Not performed Msk:  Symmetrical without gross deformities. Good, equal movement & strength bilaterally. Pulses:  Normal pulses noted. Extremities:  No clubbing or edema.  No cyanosis. Neurologic:  Alert and oriented x3;  grossly normal neurologically. Skin: Several wounds on his bilateral upper extremities with overlying scab, No jaundice. Psych:  Alert and cooperative. Normal mood and affect.  Imaging Studies: Reviewed  Assessment and Plan:   [name]. is a 62 y.o. male with history of metabolic syndrome, poorly controlled diabetes, fatty liver, chronic GERD, fundic gland polyps, s/p laparoscopic cholecystectomy in January 2024   Fatty liver with elevated LFTs, new onset of thrombocytopenia No evidence of fibrosis based on ultrasound  elastography in 2018 Recommend secondary liver disease workup Check Nash fibrosis panel Recommend right upper quadrant ultrasound Discussed about tight control of diabetes  Follow up in 6 months  Arlyss Repress, MD

## 2023-02-11 NOTE — Patient Instructions (Addendum)
Your RUQ ultrasound Is schedule for 02/20/2023 at out patient imaging arrive to 8:30am for a 8:45am scan. Nothing to eat or drink after midnight. If you need to reschedule please call [phone] option 3 and then option 2. Address is [address] [place], [place] [address]. Phone number is [phone]

## 2023-02-12 DIAGNOSIS — E1165 Type 2 diabetes mellitus with hyperglycemia: Secondary | ICD-10-CM | POA: Diagnosis not present

## 2023-02-13 ENCOUNTER — Telehealth: Payer: Self-pay

## 2023-02-13 DIAGNOSIS — R7309 Other abnormal glucose: Secondary | ICD-10-CM

## 2023-02-13 LAB — NASH FIBROSURE(R) PLUS
ALPHA 2-MACROGLOBULINS, QN: 228 mg/dL (ref 110–276)
ALT (SGPT) P5P: 51 IU/L (ref 0–55)
AST (SGOT) P5P: 28 IU/L (ref 0–40)
Apolipoprotein A-1: 127 mg/dL (ref 101–178)
Bilirubin, Total: 0.4 mg/dL (ref 0.0–1.2)
Cholesterol, Total: 140 mg/dL (ref 100–199)
Fibrosis Score: 0.31 — ABNORMAL HIGH (ref 0.00–0.21)
GGT: 30 IU/L (ref 0–65)
Glucose: 513 mg/dL (ref 70–99)
Haptoglobin: 159 mg/dL (ref 29–370)
NASH Score: 0.67 — ABNORMAL HIGH (ref 0.00–0.25)
Steatosis Score: 0.82 — ABNORMAL HIGH (ref 0.00–0.40)
Triglycerides: 188 mg/dL — ABNORMAL HIGH (ref 0–149)

## 2023-02-13 LAB — ALPHA-1-ANTITRYPSIN: A-1 Antitrypsin: 134 mg/dL (ref 101–187)

## 2023-02-13 LAB — CERULOPLASMIN: Ceruloplasmin: 22.1 mg/dL (ref 16.0–31.0)

## 2023-02-13 LAB — CELIAC DISEASE PANEL: IgA/Immunoglobulin A, Serum: 148 mg/dL (ref 90–386)

## 2023-02-13 LAB — HEPATITIS A ANTIBODY, TOTAL: hep A Total Ab: POSITIVE — AB

## 2023-02-13 LAB — HEPATITIS B SURFACE ANTIBODY,QUALITATIVE: Hep B Surface Ab, Qual: REACTIVE

## 2023-02-13 LAB — HEPATITIS B SURFACE ANTIGEN: Hepatitis B Surface Ag: NEGATIVE

## 2023-02-13 LAB — MITOCHONDRIAL ANTIBODIES: Mitochondrial Ab: <20 U (ref 0.0–20.0)

## 2023-02-13 LAB — ANTI-SMOOTH MUSCLE ANTIBODY, IGG: Smooth Muscle Ab: 4 U (ref 0–19)

## 2023-02-13 LAB — FERRITIN: Ferritin: 85 ng/mL (ref 30–400)

## 2023-02-13 LAB — HEPATITIS C ANTIBODY: Hep C Virus Ab: NONREACTIVE

## 2023-02-13 LAB — HEPATITIS B CORE ANTIBODY, TOTAL: Hep B Core Total Ab: NEGATIVE

## 2023-02-13 NOTE — Telephone Encounter (Signed)
-----   Message from [facility] sent at 02/13/2023  9:37 AM EST ----- Please inform patient that his blood sugar level was 513 based on the labs drawn on 11/25, please ask him what his blood sugar level is today.  If it is above 500, he has to go to emergency room or contact his primary care provider right away  RV

## 2023-02-13 NOTE — Telephone Encounter (Signed)
Pt called back stating he checked his sugars with his at home glucometer and it was 279. Please advise.

## 2023-02-13 NOTE — Telephone Encounter (Signed)
Patient wife [name] who is on patient DPR form verbalized understanding of results. She states he is not at the house at this time but she will get him to go for labs as soon as he gets home. She states he takes his own blood sugar every day but does not know what it was this morning. Informed her that Dr. Allegra Lai wanted this done by [facility] today. She states he saw his diabetes doctor yesterday

## 2023-02-13 NOTE — Telephone Encounter (Signed)
Called and left a message for call back  

## 2023-02-18 ENCOUNTER — Telehealth: Payer: Self-pay

## 2023-02-18 ENCOUNTER — Ambulatory Visit (INDEPENDENT_AMBULATORY_CARE_PROVIDER_SITE_OTHER): Payer: Medicare Other | Admitting: Gastroenterology

## 2023-02-18 DIAGNOSIS — K760 Fatty (change of) liver, not elsewhere classified: Secondary | ICD-10-CM

## 2023-02-18 DIAGNOSIS — Z23 Encounter for immunization: Secondary | ICD-10-CM

## 2023-02-18 NOTE — Telephone Encounter (Signed)
The patient wife [name]) called back to speak to [name].

## 2023-02-18 NOTE — Progress Notes (Signed)
Gave Hepatitis B vaccine in right deltoid patient tolerated the procedure okay with no side effects

## 2023-02-18 NOTE — Telephone Encounter (Signed)
Called and left a message for call back  

## 2023-02-18 NOTE — Telephone Encounter (Signed)
Patient wife called back and left a message wanting to know if he needed to go for the ultrasound still on 02/20/2023. Called and left a detail message informing them yes he still needs the ultrasound done

## 2023-02-18 NOTE — Telephone Encounter (Signed)
Patient wife verbalized understanding of instructions

## 2023-02-18 NOTE — Telephone Encounter (Signed)
Sent the application form for Rezdiffra  medication 80mg . Faxed Demographics, insurance, Medications and allergies, labs, last office visit to them.

## 2023-02-18 NOTE — Telephone Encounter (Signed)
-----   Message from [facility] sent at 02/17/2023 12:34 PM EST ----- And, he also needs hepatitis B vaccine.  He is immune to hepatitis A  RV

## 2023-02-18 NOTE — Telephone Encounter (Signed)
Put in a order for abdominal xray. Patient wife [name] verbalized understanding of results. He would like to start medication. He will come by and sign form and get Immunization

## 2023-02-18 NOTE — Telephone Encounter (Signed)
-----   Message from 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  He will be a candidate for Rezdiffra which is overall a safe medication to prevent progression of fatty liver to cirrhosis If he is agreeable, we can start the paperwork  Also, please change right upper quadrant ultrasound to abdominal ultrasound to evaluate for splenomegaly as well  Rohini Vanga

## 2023-02-19 ENCOUNTER — Other Ambulatory Visit: Payer: Medicare Other

## 2023-02-20 ENCOUNTER — Ambulatory Visit
Admission: RE | Admit: 2023-02-20 | Discharge: 2023-02-20 | Disposition: A | Discharge status: Home / Self Care | Payer: Medicare Other | Source: Ambulatory Visit | Attending: Gastroenterology | Admitting: Gastroenterology

## 2023-02-20 DIAGNOSIS — K760 Fatty (change of) liver, not elsewhere classified: Secondary | ICD-10-CM | POA: Diagnosis not present

## 2023-02-20 DIAGNOSIS — R932 Abnormal findings on diagnostic imaging of liver and biliary tract: Secondary | ICD-10-CM | POA: Diagnosis not present

## 2023-02-20 DIAGNOSIS — N281 Cyst of kidney, acquired: Secondary | ICD-10-CM | POA: Diagnosis not present

## 2023-02-20 DIAGNOSIS — R7989 Other specified abnormal findings of blood chemistry: Secondary | ICD-10-CM | POA: Insufficient documentation

## 2023-02-21 ENCOUNTER — Telehealth: Payer: Self-pay

## 2023-02-21 NOTE — Telephone Encounter (Signed)
-----   Message from [facility] sent at 02/20/2023  4:47 PM EST ----- Abdominal ultrasound shows fatty liver only.  His spleen and pancreas were normal appearing.  Benign cyst in the kidneys, nothing to worry about  RV

## 2023-02-21 NOTE — Telephone Encounter (Signed)
Patient wife [name]  verbalized understanding of results

## 2023-02-22 ENCOUNTER — Ambulatory Visit: Payer: Medicare Other | Admitting: Cardiovascular Disease

## 2023-02-22 NOTE — Telephone Encounter (Signed)
Tried to call to check on the status of the application but was on hold for 30 minutes will try again on Monday

## 2023-02-25 NOTE — Telephone Encounter (Signed)
Called patient support today and they will get the case manager  to ask about the copay with insurance. They will send [name] a fax as soon as they receive all the information

## 2023-02-26 ENCOUNTER — Ambulatory Visit: Payer: Medicare Other

## 2023-02-26 DIAGNOSIS — E1159 Type 2 diabetes mellitus with other circulatory complications: Secondary | ICD-10-CM

## 2023-02-26 DIAGNOSIS — E1165 Type 2 diabetes mellitus with hyperglycemia: Secondary | ICD-10-CM

## 2023-02-26 DIAGNOSIS — I6523 Occlusion and stenosis of bilateral carotid arteries: Secondary | ICD-10-CM | POA: Diagnosis not present

## 2023-02-26 DIAGNOSIS — R0789 Other chest pain: Secondary | ICD-10-CM

## 2023-02-26 DIAGNOSIS — R072 Precordial pain: Secondary | ICD-10-CM | POA: Diagnosis not present

## 2023-02-26 DIAGNOSIS — I259 Chronic ischemic heart disease, unspecified: Secondary | ICD-10-CM

## 2023-02-26 DIAGNOSIS — I152 Hypertension secondary to endocrine disorders: Secondary | ICD-10-CM | POA: Diagnosis not present

## 2023-02-26 DIAGNOSIS — E1169 Type 2 diabetes mellitus with other specified complication: Secondary | ICD-10-CM

## 2023-02-26 DIAGNOSIS — K219 Gastro-esophageal reflux disease without esophagitis: Secondary | ICD-10-CM

## 2023-02-26 MED ORDER — IOHEXOL 350 MG/ML SOLN
100.0000 mL | Freq: Once | INTRAVENOUS | Status: AC | PRN
  Administered 2023-02-26: 100 mL via INTRAVENOUS

## 2023-02-27 ENCOUNTER — Telehealth: Payer: Self-pay | Admitting: Gastroenterology

## 2023-02-27 NOTE — Telephone Encounter (Signed)
The patient wife ([name]) called in because his medication has not arrived and they don't know the name of the medications. The patient don't know who was suppose to deliver the medication please call patient to advise.

## 2023-02-27 NOTE — Telephone Encounter (Signed)
Called patient wife and informed her it was still in the approval process with insurance. Informed her I would let her know something as soon as I knew something

## 2023-02-28 ENCOUNTER — Telehealth: Payer: Self-pay

## 2023-02-28 NOTE — Telephone Encounter (Signed)
Insurance has approved medication through 03/18/2024 Reference number [id number]

## 2023-02-28 NOTE — Telephone Encounter (Signed)
Submitted PA through cover my meds. Received the patient support fax back and it said patient did require a PA through [employer]. It did say patient does not have coverage for this product because it is not covered on th formulary. It also they sent information to pharmacy RX triage Package.. if we need to call patient support we can call them at [phone]

## 2023-02-28 NOTE — Telephone Encounter (Signed)
Called the patient support and the prescription was sent to [facility] and there phone number is [phone]

## 2023-02-28 NOTE — Telephone Encounter (Signed)
They also wanted a fax for there records of the approval. Faxed letter to [phone]

## 2023-03-01 ENCOUNTER — Ambulatory Visit (INDEPENDENT_AMBULATORY_CARE_PROVIDER_SITE_OTHER): Payer: Medicare Other | Admitting: Cardiovascular Disease

## 2023-03-01 ENCOUNTER — Other Ambulatory Visit: Payer: Self-pay | Admitting: Cardiovascular Disease

## 2023-03-01 ENCOUNTER — Encounter: Payer: Self-pay | Admitting: Cardiovascular Disease

## 2023-03-01 ENCOUNTER — Other Ambulatory Visit: Payer: Self-pay | Admitting: Nurse Practitioner

## 2023-03-01 VITALS — BP 124/82 | HR 55 | Ht 64.0 in | Wt 165.6 lb

## 2023-03-01 DIAGNOSIS — E785 Hyperlipidemia, unspecified: Secondary | ICD-10-CM | POA: Diagnosis not present

## 2023-03-01 DIAGNOSIS — E1159 Type 2 diabetes mellitus with other circulatory complications: Secondary | ICD-10-CM

## 2023-03-01 DIAGNOSIS — R0789 Other chest pain: Secondary | ICD-10-CM | POA: Diagnosis not present

## 2023-03-01 DIAGNOSIS — E1169 Type 2 diabetes mellitus with other specified complication: Secondary | ICD-10-CM

## 2023-03-01 DIAGNOSIS — E1165 Type 2 diabetes mellitus with hyperglycemia: Secondary | ICD-10-CM

## 2023-03-01 DIAGNOSIS — I6523 Occlusion and stenosis of bilateral carotid arteries: Secondary | ICD-10-CM

## 2023-03-01 DIAGNOSIS — I152 Hypertension secondary to endocrine disorders: Secondary | ICD-10-CM | POA: Diagnosis not present

## 2023-03-01 DIAGNOSIS — I259 Chronic ischemic heart disease, unspecified: Secondary | ICD-10-CM

## 2023-03-01 DIAGNOSIS — K219 Gastro-esophageal reflux disease without esophagitis: Secondary | ICD-10-CM

## 2023-03-01 MED ORDER — SUCRALFATE 1 G PO TABS: 1.0000 g | ORAL_TABLET | Freq: Four times a day (QID) | ORAL | 1 refills | Status: DC

## 2023-03-01 NOTE — Progress Notes (Signed)
Cardiology Office Note   Date:  03/01/2023   ID:  [name], DOB 1960/08/18, MRN [id number]  PCP:  Marjie Skiff, NP  Cardiologist:  Adrian Blackwater, MD      History of Present Illness: [name]. is a 62 y.o. male who presents for No chief complaint on file.   I feel tightness in chest      Past Medical History:  Diagnosis Date   Arrhythmia    Asthma    CCC (chronic calculous cholecystitis)    COPD (chronic obstructive pulmonary disease) (HCC)    Cyst of kidney, acquired    Diabetes mellitus without complication (HCC) 2013   type 2   Diverticulosis    Edentulous    Fatty liver    Gallbladder polyp    GERD (gastroesophageal reflux disease)    Heart murmur    History of chicken pox    History of measles as a child    History of PKU    Hyperlipidemia    Hypertension    IBS (irritable bowel syndrome)    Irregular heart beat    Mentally challenged    Pancreatitis    PONV (postoperative nausea and vomiting)    Stroke (HCC) 03/02/2019   vision issues - right eye/right sided weakness   Vision impairment    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     Past Surgical History:  Procedure Laterality Date   Cardiac Catherization     [facility]   CARDIAC CATHETERIZATION     [facility]   CATARACT EXTRACTION W/PHACO Left 06/05/2021   Procedure: CATARACT EXTRACTION PHACO AND INTRAOCULAR LENS PLACEMENT (IOC) LEFT DIABETIC 3.75 00:28.0;  Surgeon: Nevada Crane, MD;  Location: [facility];  Service: Ophthalmology;  Laterality: Left;  Diabetic   CATARACT EXTRACTION W/PHACO Right 07/10/2021   Procedure: CATARACT EXTRACTION PHACO AND INTRAOCULAR LENS PLACEMENT (IOC) RIGHT DIABETIC;  Surgeon: Nevada Crane, MD;  Location: [facility];  Service: Ophthalmology;  Laterality: Right;  Diabetic 2.66 00:24.4   COLONOSCOPY     COLONOSCOPY WITH PROPOFOL N/A 08/10/2020   Procedure: COLONOSCOPY WITH PROPOFOL;  Surgeon: Toney Reil, MD;   Location: [facility];  Service: Gastroenterology;  Laterality: N/A;  Has ankle monitor; (Parole officer "Ms Ward Givens needs a time before Monday [phone]" - per patient's wife)   ESOPHAGOGASTRODUODENOSCOPY (EGD) WITH PROPOFOL N/A 12/27/2016   Procedure: ESOPHAGOGASTRODUODENOSCOPY (EGD) WITH PROPOFOL;  Surgeon: Toney Reil, MD;  Location: [facility] END[facility];  Service: Gastroenterology;  Laterality: N/A;   HEMORRHOID SURGERY     KNEE SURGERY     cyst removed   Ligament Removal Left    of left thumb: dr. Alberteen Spindle   ligament removal  of left thumb     Dr. Alberteen Spindle   NM GATED MYOCARDIAL STUDY (ARMX HX)  06/23/2014   Paraschos. Normal   WISDOM TOOTH EXTRACTION       Current Outpatient Medications  Medication Sig Dispense Refill   sucralfate (CARAFATE) 1 g tablet Take 1 tablet (1 g total) by mouth 4 (four) times daily. 120 tablet 1   acetaminophen (TYLENOL) 500 MG tablet Take 500 mg by mouth every 6 (six) hours as needed.     albuterol (VENTOLIN HFA) 108 (90 Base) MCG/ACT inhaler Inhale 2 puffs into the lungs every 6 (six) hours as needed. 18 g 0   aspirin 81 MG EC tablet Take 1 tablet (81 mg total) by mouth daily. 180 tablet 0   clopidogrel (PLAVIX)  75 MG tablet Take 1 tablet (75 mg total) by mouth daily. 90 tablet 4   empagliflozin (JARDIANCE) 25 MG TABS tablet Take 1 tablet (25 mg total) by mouth daily. 90 tablet 4   EPINEPHrine 0.3 mg/0.3 mL IJ SOAJ injection INJECT 1 SYRINGE INTO OUTER THIGH ONCE AS NEEDED FOR SEVERE ALLERGIC REACTION. Strength 0.3 MG/0.3ML 2 each 1   fenofibrate 54 MG tablet Take 1 tablet (54 mg total) by mouth daily. 90 tablet 4   fluticasone furoate-vilanterol (BREO ELLIPTA) 100-25 MCG/ACT AEPB Inhale 1 puff into the lungs daily. 60 each 4   gabapentin (NEURONTIN) 300 MG capsule Take 1 capsule (300 mg total) by mouth 2 (two) times daily. 180 capsule 4   glucose blood (ONETOUCH ULTRA) test strip TEST BLOOD SUGAR 5 TIMES A DAY 300 each 4   HYDROcodone-acetaminophen  (NORCO/VICODIN) 5-325 MG tablet Take 1 tablet by mouth every 6 (six) hours as needed for moderate pain (pain score 4-6).     icosapent Ethyl (VASCEPA) 1 g capsule Take 1 capsule (1 g total) by mouth 2 (two) times daily. 180 capsule 4   Insulin Degludec (TRESIBA) 100 UNIT/ML SOLN Inject 40 Units into the skin 2 (two) times daily. (Patient taking differently: Inject 45 Units into the skin 2 (two) times daily.) 30 mL 4   Insulin Lispro Junior KwikPen (HUMALOG JR) 100 UNIT/ML KwikPen Inject 42 Units into the skin 3 (three) times daily before meals.     isosorbide mononitrate (IMDUR) 30 MG 24 hr tablet Take 1 tablet (30 mg total) by mouth daily. 30 tablet 1   lisinopril (ZESTRIL) 20 MG tablet Take 1 tablet (20 mg total) by mouth daily. 90 tablet 4   Misc. Devices (PULSE OXIMETER FOR FINGER) MISC To check O2 saturations once daily with asthma and document + check if any shortness of breath 1 each 1   Misc. Devices (PULSE OXIMETER) MISC 1 each by Does not apply route every other day. 1 each 0   montelukast (SINGULAIR) 10 MG tablet Take 1 tablet (10 mg total) by mouth at bedtime. 90 tablet 4   mupirocin ointment (BACTROBAN) 2 % Apply 1 Application topically 2 (two) times daily. 22 g 3   nitroGLYCERIN (NITROSTAT) 0.4 MG SL tablet Place 1 tablet (0.4 mg total) under the tongue every 5 (five) minutes as needed for chest pain. 50 tablet 3   OneTouch Delica Lancets 33G MISC USE AS DIRECTED 300 each 3   pantoprazole (PROTONIX) 40 MG tablet Take 1 tablet (40 mg total) by mouth daily. 30 tablet 1   rosuvastatin (CRESTOR) 40 MG tablet Take 1 tablet (40 mg total) by mouth daily. 90 tablet 4   sitaGLIPtin (JANUVIA) 100 MG tablet Take 1 tablet (100 mg total) by mouth daily. 90 tablet 4   triamcinolone cream (KENALOG) 0.1 % Apply 1 Application topically 2 (two) times daily. (Patient taking differently: Apply 1 Application topically 2 (two) times daily. Arms and legs) 30 g 1   valACYclovir (VALTREX) 500 MG tablet Take 500  mg by mouth 3 (three) times daily.     Vitamin D, Ergocalciferol, (DRISDOL) 1.25 MG (50000 UNIT) CAPS capsule Take 50,000 Units by mouth once a week.     No current facility-administered medications for this visit.    Allergies:   Sulfa antibiotics, Bee venom, Morphine, and Other    Social History:   reports that he has never smoked. He has never been exposed to tobacco smoke. He has never used smokeless tobacco. He reports  that he does not drink alcohol and does not use drugs.   Family History:  family history includes Cancer in his maternal grandfather, maternal grandmother, mother, and paternal grandfather; Diabetes in his father; Heart disease in his father.    ROS:     Review of Systems  Constitutional: Negative.   HENT: Negative.    Eyes: Negative.   Respiratory: Negative.    Gastrointestinal: Negative.   Genitourinary: Negative.   Musculoskeletal: Negative.   Skin: Negative.   Neurological: Negative.   Endo/Heme/Allergies: Negative.   Psychiatric/Behavioral: Negative.    All other systems reviewed and are negative.     All other systems are reviewed and negative.    PHYSICAL EXAM: VS:  BP 124/82   Pulse (!) 55   Ht 5\' 4"  (1.626 m)   Wt 165 lb 9.6 oz (75.1 kg)   SpO2 98%   BMI 28.43 kg/m  , BMI Body mass index is 28.43 kg/m. Last weight:  Wt Readings from Last 3 Encounters:  03/01/23 165 lb 9.6 oz (75.1 kg)  02/11/23 170 lb 4 oz (77.2 kg)  02/05/23 172 lb 3.2 oz (78.1 kg)     Physical Exam Vitals reviewed.  Constitutional:      Appearance: Normal appearance. He is normal weight.  HENT:     Head: Normocephalic.     Nose: Nose normal.     Mouth/Throat:     Mouth: Mucous membranes are moist.  Eyes:     Pupils: Pupils are equal, round, and reactive to light.  Cardiovascular:     Rate and Rhythm: Normal rate and regular rhythm.     Pulses: Normal pulses.     Heart sounds: Normal heart sounds.  Pulmonary:     Effort: Pulmonary effort is normal.   Abdominal:     General: Abdomen is flat. Bowel sounds are normal.  Musculoskeletal:        General: Normal range of motion.     Cervical back: Normal range of motion.  Skin:    General: Skin is warm.  Neurological:     General: No focal deficit present.     Mental Status: He is alert.  Psychiatric:        Mood and Affect: Mood normal.       EKG:   Recent Labs: 10/11/2022: Magnesium 2.3 10/31/2022: Hemoglobin 12.8; Platelets 137 01/28/2023: ALT 45; BUN 13; Creatinine, Ser 1.03; Potassium 4.5; Sodium 136; TSH 1.040    Lipid Panel    Component Value Date/Time   CHOL 140 02/11/2023 1507   TRIG 188 (H) 02/11/2023 1507   HDL 36 (L) 01/01/2023 1422   CHOLHDL 5.3 03/03/2019 0447   VLDL 44 (H) 03/03/2019 0447   VLDL 229 (H) 12/02/2018 1408   LDLCALC 105 (H) 01/01/2023 1422      Other studies Reviewed: Additional studies/ records that were reviewed today include:  Review of the above records demonstrates:       No data to display            ASSESSMENT AND PLAN:    ICD-10-CM   1. Hypertension associated with diabetes (HCC)  E11.59 sucralfate (CARAFATE) 1 g tablet   I15.2     2. Uncontrolled type 2 diabetes mellitus with hyperglycemia (HCC)  E11.65 sucralfate (CARAFATE) 1 g tablet    3. Hyperlipidemia associated with type 2 diabetes mellitus (HCC)  E11.69 sucralfate (CARAFATE) 1 g tablet   E78.5     4. Chest pain, non-cardiac  R07.89 sucralfate (CARAFATE)  1 g tablet   CCTA showed ca score 0.3, normal coronaries. May have GERD as already on protonix, and will add carafate       Problem List Items Addressed This Visit       Cardiovascular and Mediastinum   Hypertension associated with diabetes (HCC) - Primary   Relevant Medications   sucralfate (CARAFATE) 1 g tablet     Endocrine   Hyperlipidemia associated with type 2 diabetes mellitus (HCC)   Relevant Medications   sucralfate (CARAFATE) 1 g tablet   Uncontrolled type 2 diabetes mellitus with  hyperglycemia (HCC)   Relevant Medications   sucralfate (CARAFATE) 1 g tablet   Other Visit Diagnoses       Chest pain, non-cardiac       CCTA showed ca score 0.3, normal coronaries. May have GERD as already on protonix, and will add carafate   Relevant Medications   sucralfate (CARAFATE) 1 g tablet          Disposition:   Return in about 4 weeks (around 03/29/2023).    Total time spent: 30 minutes  Signed,  Adrian Blackwater, MD  03/01/2023 10:05 AM    [facility]

## 2023-03-01 NOTE — Telephone Encounter (Signed)
Patient wife [name] called back and verbalized understanding. She states she will let the patient know

## 2023-03-01 NOTE — Telephone Encounter (Signed)
[facility] sent a fax of clarification this morning if we wanted 90 days or 30 days and if we wanted any refills. Put 90 days and 3 refills.  Called and left a message for call back for patient to let him know it has been approved and the medication would be coming from [facility] speciality

## 2023-03-01 NOTE — Telephone Encounter (Signed)
Patient wife called back

## 2023-03-01 NOTE — Telephone Encounter (Signed)
Requested medication (s) are due for refill today - yes  Requested medication (s) are on the active medication list -yes  Future visit scheduled -yes  Last refill: 05/29/22 #300 4RF  Notes to clinic: no protocol listed  Requested Prescriptions  Pending Prescriptions Disp Refills   ONETOUCH ULTRA TEST test strip [Pharmacy Med Name: ONETOUCH ULTRA TEST STRIP] 300 each 4    Sig: USE TO CHECK BLOOD GLUCOSE 5 TIMES A DAYAS DIRECTED     There is no refill protocol information for this order       Requested Prescriptions  Pending Prescriptions Disp Refills   ONETOUCH ULTRA TEST test strip [Pharmacy Med Name: ONETOUCH ULTRA TEST STRIP] 300 each 4    Sig: USE TO CHECK BLOOD GLUCOSE 5 TIMES A DAYAS DIRECTED     There is no refill protocol information for this order

## 2023-03-04 ENCOUNTER — Ambulatory Visit: Payer: Self-pay | Admitting: *Deleted

## 2023-03-04 NOTE — Telephone Encounter (Signed)
Medication has been approved and sent to [facility] and they will be shipping medication to patient

## 2023-03-04 NOTE — Telephone Encounter (Signed)
Per Raiford Noble with [facility] Hey Miss Morrie Sheldon!     I see patient [name] 09/04/1960 is approved for Rezdiffra with $0 copay.  We are calling him for shipment.      Thank you Miss lady!

## 2023-03-04 NOTE — Patient Outreach (Signed)
  Care Coordination   Follow Up Visit Note   03/04/2023 Name: [name]. MRN: [id number] DOB: 1960-09-27  [name]. is a 62 y.o. year old male who sees [name], Corrie Dandy T, NP for primary care. I spoke with  [name]. And wife by phone today.  What matters to the patients health and wellness today?  Continues to work to decrease blood sugars and A1C    Goals Addressed             This Visit's Progress    Effective management of chronic medical conditions       Interventions Today    Flowsheet Row Most Recent Value  Chronic Disease   Chronic disease during today's visit Diabetes, Hypertension (HTN), Other  [GERD]  General Interventions   General Interventions Discussed/Reviewed General Interventions Reviewed, Doctor Visits, Labs  Labs Hgb A1c every 3 months  Doctor Visits Discussed/Reviewed Doctor Visits Reviewed, PCP, Specialist  [Upcoming wiht PCP on 1/6 and cardiology on 1/16]  PCP/Specialist Visits Compliance with follow-up visit  Education Interventions   Education Provided Provided Education  Provided Verbal Education On Medication, Blood Sugar Monitoring, When to see the doctor, Exercise, Nutrition  [Report blood sugars are decreasing, many being less than 200 now]  Nutrition Interventions   Nutrition Discussed/Reviewed Nutrition Reviewed, Adding fruits and vegetables, Fluid intake, Decreasing salt, Decreasing sugar intake, Carbohydrate meal planning  [Patietn still drinking lots of sodas and sugary foods, wife working to decrease sugar intake]  Pharmacy Interventions   Pharmacy Dicussed/Reviewed Pharmacy Topics Reviewed, Medications and their functions  [Discussed GERD medications with wife]              SDOH assessments and interventions completed:  No     Care Coordination Interventions:  Yes, provided    Follow up plan: Follow up call scheduled for 1/22    Encounter Outcome:  Patient Visit Completed   Rodney Langton, RN, MSN, CCM [place]  [facility], [place] [facility] RN Care Coordinator Direct Dial: [phone] / Main [phone] Fax [phone] Email: [email or web address]: [email or web address]

## 2023-03-18 ENCOUNTER — Ambulatory Visit (INDEPENDENT_AMBULATORY_CARE_PROVIDER_SITE_OTHER): Payer: Medicare Other

## 2023-03-18 ENCOUNTER — Telehealth: Payer: Self-pay | Admitting: Gastroenterology

## 2023-03-18 DIAGNOSIS — Z23 Encounter for immunization: Secondary | ICD-10-CM | POA: Diagnosis not present

## 2023-03-18 NOTE — Telephone Encounter (Signed)
The patient wife ([name]) called in because the medication that was prescribed his insurance will not cover. She doesn't know the name of the medications. She stated that the insurance sent them a letter letting them know they need a different medication. I asked her if the medication was Rezdiffa but she said she is not sure.

## 2023-03-22 NOTE — Telephone Encounter (Signed)
 We do not have  a medication that was prescribed that was not covered the Rezdiffa was a 0 dollar copay and should of been shipped to the patient. Called patient and left a detail message

## 2023-03-25 ENCOUNTER — Ambulatory Visit: Payer: Medicare Other | Admitting: Nurse Practitioner

## 2023-03-25 ENCOUNTER — Telehealth: Payer: Self-pay | Admitting: Nurse Practitioner

## 2023-03-25 DIAGNOSIS — E1159 Type 2 diabetes mellitus with other circulatory complications: Secondary | ICD-10-CM

## 2023-03-25 DIAGNOSIS — I209 Angina pectoris, unspecified: Secondary | ICD-10-CM

## 2023-03-25 DIAGNOSIS — E1165 Type 2 diabetes mellitus with hyperglycemia: Secondary | ICD-10-CM

## 2023-03-25 DIAGNOSIS — K760 Fatty (change of) liver, not elsewhere classified: Secondary | ICD-10-CM

## 2023-03-25 DIAGNOSIS — E559 Vitamin D deficiency, unspecified: Secondary | ICD-10-CM

## 2023-03-25 DIAGNOSIS — E1129 Type 2 diabetes mellitus with other diabetic kidney complication: Secondary | ICD-10-CM

## 2023-03-25 DIAGNOSIS — E1169 Type 2 diabetes mellitus with other specified complication: Secondary | ICD-10-CM

## 2023-03-25 DIAGNOSIS — J4540 Moderate persistent asthma, uncomplicated: Secondary | ICD-10-CM

## 2023-03-25 DIAGNOSIS — E538 Deficiency of other specified B group vitamins: Secondary | ICD-10-CM

## 2023-03-25 DIAGNOSIS — Z8673 Personal history of transient ischemic attack (TIA), and cerebral infarction without residual deficits: Secondary | ICD-10-CM

## 2023-03-25 DIAGNOSIS — E701 Other hyperphenylalaninemias: Secondary | ICD-10-CM

## 2023-03-25 NOTE — Telephone Encounter (Signed)
 Copied from CRM [id number]. Topic: General - Other >> Mar 25, 2023  9:42 AM Epimenio Foot F wrote: Reason for CRM: Pt is calling in wanting to inform the office that he cancelled his appointment due to his wife being in the hospital

## 2023-03-27 ENCOUNTER — Ambulatory Visit: Payer: Self-pay

## 2023-03-27 NOTE — Telephone Encounter (Signed)
 Chief Complaint: Diarrhea  Symptoms: Diarrhea, blood in stool  Frequency: 10 episodes Pertinent Negatives: Patient denies fever, vomiting, current symptoms Disposition: [] ED /[] Urgent Care (no appt availability in office) / [] Appointment(In office/virtual)/ []  [facility]/ [x] Home Care/ [] Refused Recommended Disposition /[] [place]/ []  Follow-up with PCP Additional Notes: Patient states he had about 10 episodes of diarrhea yesterday and noticed some blood on tissue after wiping. Patient states he thinks the bleeding was from wiping too hard. Patient denies any diarrhea episodes today and states he feels better. Patient reported taking  Imodium  AD yesterday to treat symptoms. Patient stated his wife was sick and in the hospital with norovirus and she had diarrhea too. Patient also stated he ate some fish on Friday that did not smell good and it could have caused his symptoms. Care advice given and patient will callback if symptoms return.  Summary: Diarrhea   Pt wife stated she tested positive for cholera and norovirus. Stated pt has Diarrhea.  Seeking clinical     Reason for Disposition  SEVERE diarrhea (e.g., 7 or more times / day more than normal)  Answer Assessment - Initial Assessment Questions 1. DIARRHEA SEVERITY: How bad is the diarrhea? How many more stools have you had in the past 24 hours than normal?    - NO DIARRHEA (SCALE 0)   - MILD (SCALE 1-3): Few loose or mushy BMs; increase of 1-3 stools over normal daily number of stools; mild increase in ostomy output.   -  MODERATE (SCALE 4-7): Increase of 4-6 stools daily over normal; moderate increase in ostomy output.   -  SEVERE (SCALE 8-10; OR WORST POSSIBLE): Increase of 7 or more stools daily over normal; moderate increase in ostomy output; incontinence.     About 10 episodes  2. ONSET: When did the diarrhea begin?      Yesterday  3. BM CONSISTENCY: How loose or watery is the diarrhea?       Watery  4. VOMITING: Are you also vomiting? If Yes, ask: How many times in the past 24 hours?      No  5. ABDOMEN PAIN: Are you having any abdomen pain? If Yes, ask: What does it feel like? (e.g., crampy, dull, intermittent, constant)      No  6. ABDOMEN PAIN SEVERITY: If present, ask: How bad is the pain?  (e.g., Scale 1-10; mild, moderate, or severe)   - MILD (1-3): doesn't interfere with normal activities, abdomen soft and not tender to touch    - MODERATE (4-7): interferes with normal activities or awakens from sleep, abdomen tender to touch    - SEVERE (8-10): excruciating pain, doubled over, unable to do any normal activities       No new pain  7. ORAL INTAKE: If vomiting, Have you been able to drink liquids? How much liquids have you had in the past 24 hours?     No vomiting 8. HYDRATION: Any signs of dehydration? (e.g., dry mouth [not just dry lips], too weak to stand, dizziness, new weight loss) When did you last urinate?     I'm not dehydrated  9. EXPOSURE: Have you traveled to a foreign country recently? Have you been exposed to anyone with diarrhea? Could you have eaten any food that was spoiled?     My wife had Diarrhea, I ate fish that did smell good on Friday  10. ANTIBIOTIC USE: Are you taking antibiotics now or have you taken antibiotics in the past 2 months?  No  11. OTHER SYMPTOMS: Do you have any other symptoms? (e.g., fever, blood in stool)       Blood in stool  Protocols used: Diarrhea-A-AH

## 2023-04-01 ENCOUNTER — Telehealth (INDEPENDENT_AMBULATORY_CARE_PROVIDER_SITE_OTHER): Payer: Self-pay

## 2023-04-01 NOTE — Telephone Encounter (Signed)
 [name] called for [name] stating he is having pain in both legs. It has been going on for years because he has diabetic neuropathy. He would like to schedule an appt with Dr. Gilda Crease to be seen.   Please advise

## 2023-04-01 NOTE — Telephone Encounter (Signed)
 We can schdeule him to see Dr. Gilda Crease however,  they should know that we do not treat diabetic neuropathy.  If he wants treatment for that he would need to see a neurologist, not a vascular surgeon.

## 2023-04-01 NOTE — Telephone Encounter (Signed)
 [name] said she will call his PCP to get him referred to someone for neuropathy.

## 2023-04-02 NOTE — Telephone Encounter (Signed)
 Emailed rick to find out the status of the medication with [employer]

## 2023-04-02 NOTE — Telephone Encounter (Signed)
 Pt's spouse [name] called requesting information regarding the medication that pt was receive via mail... She states they have yet to receive the Rx and referred back to the letter she received stating the medication will not be covered, does not recall the name of the medication

## 2023-04-03 ENCOUNTER — Telehealth: Payer: Self-pay | Admitting: Nurse Practitioner

## 2023-04-03 ENCOUNTER — Telehealth: Payer: Self-pay | Admitting: Gastroenterology

## 2023-04-03 MED ORDER — ROSUVASTATIN CALCIUM 20 MG PO TABS: 20.0000 mg | ORAL_TABLET | Freq: Every day | ORAL | 3 refills | Status: DC

## 2023-04-03 NOTE — Telephone Encounter (Signed)
 Per rick with [employer] I see [name] 1960-08-13 is all set for delivery tomorrow.

## 2023-04-03 NOTE — Telephone Encounter (Signed)
 Called [name] this morning and he has a 4 dollar copay and they have been trying to call him to set up shipment but unable to reach patient. He asked what phone number we had the patient. I gave it to him and they had a different one so he updated the number and said that he could also call them at [phone]

## 2023-04-03 NOTE — Telephone Encounter (Signed)
 The patient called in and left a voicemail letting us  know that she spoke to [facility] and they will be sending his medication out. I called her back to let her know that we got her message, and I inform her that I notate her message.

## 2023-04-03 NOTE — Telephone Encounter (Signed)
 Copied from CRM [id number]. Topic: General - Other >> Apr 03, 2023 11:32 AM Baldemar Lev wrote: Reason for CRM: Pt's wife called reporting that the patient needs to lower his rosuvastatin  (CRESTOR ) 40 MG tablet to 20 MG. According to the pt's wife he will be starting a new medication for his liver that will interact with a high dose of rosuvastatin  so they need his prescription lowered to 20 MG instead of 40 MG.

## 2023-04-03 NOTE — Telephone Encounter (Signed)
 Routing to provider to advise.

## 2023-04-03 NOTE — Telephone Encounter (Signed)
 Disregard

## 2023-04-03 NOTE — Telephone Encounter (Signed)
 Called patient and left a message for call back

## 2023-04-04 ENCOUNTER — Telehealth: Payer: Self-pay | Admitting: Nurse Practitioner

## 2023-04-04 ENCOUNTER — Ambulatory Visit: Payer: Medicare Other | Admitting: Cardiovascular Disease

## 2023-04-04 DIAGNOSIS — E1165 Type 2 diabetes mellitus with hyperglycemia: Secondary | ICD-10-CM

## 2023-04-04 MED ORDER — ONETOUCH ULTRA 2 W/DEVICE KIT: 1.0000 | PACK | Freq: Three times a day (TID) | 0 refills | Status: DC

## 2023-04-04 MED ORDER — ONETOUCH DELICA LANCETS 33G MISC: 3 refills | Status: DC

## 2023-04-04 MED ORDER — ONETOUCH ULTRA TEST VI STRP: ORAL_STRIP | 4 refills | Status: DC

## 2023-04-04 NOTE — Telephone Encounter (Signed)
Routing to provider. Prescriptions t'd up

## 2023-04-04 NOTE — Telephone Encounter (Signed)
The patient states he has misplaced his glucose meter and is asking for his provider to write a script for a whole new kit. He states he uses    [facility] - [place], [place] - [address] Phone: [phone]  Fax: [phone]      Please assist patient further so he can check his sugar. He states it has been since the 12th since he last checked it.

## 2023-04-05 ENCOUNTER — Other Ambulatory Visit: Payer: Self-pay | Admitting: Nurse Practitioner

## 2023-04-05 DIAGNOSIS — E1129 Type 2 diabetes mellitus with other diabetic kidney complication: Secondary | ICD-10-CM

## 2023-04-05 DIAGNOSIS — E1165 Type 2 diabetes mellitus with hyperglycemia: Secondary | ICD-10-CM

## 2023-04-05 MED ORDER — ACCU-CHEK AVIVA PLUS W/DEVICE KIT: 1.0000 | PACK | Freq: Every day | 0 refills | Status: AC

## 2023-04-05 MED ORDER — ACCU-CHEK SOFTCLIX LANCETS MISC: 1.0000 | Freq: Every day | 12 refills | Status: AC

## 2023-04-05 MED ORDER — ACCU-CHEK AVIVA PLUS VI STRP: 1.0000 | ORAL_STRIP | Freq: Every day | 12 refills | Status: AC

## 2023-04-05 NOTE — Telephone Encounter (Signed)
Copied from CRM [id number]. Topic: General - Other >> Apr 05, 2023 11:19 AM Macon Large wrote: Reason for CRM: Mandie with [facility] stated that the pt tests 5 times daily so they will need a new Rx for the Accu-Chek Guide monitor, lancets, and test strips to be sent to them. Cb# [phone]

## 2023-04-05 NOTE — Telephone Encounter (Signed)
New prescriptions t'd up for provider.

## 2023-04-10 ENCOUNTER — Ambulatory Visit: Payer: Self-pay | Admitting: *Deleted

## 2023-04-10 NOTE — Patient Instructions (Signed)
Visit Information  Thank you for taking time to visit with me today. Please don't hesitate to contact me if I can be of assistance to you before our next scheduled telephone appointment.  Following are the goals we discussed today:  Watch the amount of sugars and carbs you eat.  Monitor blood sugar daily.  Call endocrinology to schedule follow up.  Our next appointment is by telephone on 2/20  Please call the care guide team at [phone] if you need to cancel or reschedule your appointment.   Please call the Suicide and Crisis Lifeline: 988 call the [place] National Suicide Prevention Lifeline: 863-644-1635 or TTY: 352-511-6182 TTY 5746278659) to talk to a trained counselor call 1-800-273-TALK (toll free, 24 hour hotline) call 911 if you are experiencing a Mental Health or Behavioral Health Crisis or need someone to talk to.  The patient verbalized understanding of instructions, educational materials, and care plan provided today and agreed to receive a mailed copy of patient instructions, educational materials, and care plan.   The patient has been provided with contact information for the care management team and has been advised to call with any health related questions or concerns.   Rodney Langton, RN, MSN, CCM [facility], [facility] RN Care Coordinator Direct Dial: [phone] / Main [phone] Fax [phone] Email: [email or web address]: [email or web address]

## 2023-04-10 NOTE — Patient Outreach (Signed)
  Care Coordination   Follow Up Visit Note   04/10/2023 Name: [name]. MRN: [id number] DOB: 01-Feb-1961  [name]. is a 63 y.o. year old male who sees [name], Corrie Dandy T, NP for primary care. I spoke with  [name]. And wife by phone today.  What matters to the patients health and wellness today?  Continues to work to improve A1C and have better DM management.     Goals Addressed             This Visit's Progress    Effective management of chronic medical conditions   On track    Interventions Today    Flowsheet Row Most Recent Value  Chronic Disease   Chronic disease during today's visit Diabetes, Other, Hypertension (HTN)  [HLD]  General Interventions   General Interventions Discussed/Reviewed General Interventions Reviewed, Doctor Visits, Durable Medical Equipment (DME), Community Resources  [Wife report electric bill was greater than $500, unable to pay and still afford medical treatment. Agrees to referral for community resources]  Doctor Visits Discussed/Reviewed Doctor Visits Reviewed, PCP, Specialist  [Upcoming reviewed: PCP on 1/27, changed due to weather a couple weeks ago. Aware of need to call endocrinology to schedule follow up for DM management]  Durable Medical Equipment (DME) Glucomoter  [Confirms he has equipment for DM monitoring]  PCP/Specialist Visits Compliance with follow-up visit  Education Interventions   Education Provided Provided Education  Provided Verbal Education On Blood Sugar Monitoring, Medication, When to see the doctor, Nutrition, Labs  [Meds reviewed. continues blood sugar monitoring, today was 246, but report some readings in the 100s]  Labs Reviewed Hgb A1c  [Aware that A1C still remained elevated, will have checked with next MD appointment]  Nutrition Interventions   Nutrition Discussed/Reviewed Nutrition Reviewed, Adding fruits and vegetables, Decreasing sugar intake, Portion sizes, Carbohydrate meal  planning  [Admits he still does not 100% adhere to diabetic diet, wife helping to stay compliant]              SDOH assessments and interventions completed:  No     Care Coordination Interventions:  Yes, provided   Follow up plan: Follow up call scheduled for 2/20    Encounter Outcome:  Patient Visit Completed   Rodney Langton, RN, MSN, CCM [place]  [facility], [facility] RN Care Coordinator Direct Dial: [phone] / Main [phone] Fax [phone] Email: [email or web address]

## 2023-04-13 NOTE — Patient Instructions (Incomplete)

## 2023-04-15 ENCOUNTER — Ambulatory Visit: Payer: Medicare Other | Admitting: Nurse Practitioner

## 2023-04-15 DIAGNOSIS — E785 Hyperlipidemia, unspecified: Secondary | ICD-10-CM

## 2023-04-15 DIAGNOSIS — I209 Angina pectoris, unspecified: Secondary | ICD-10-CM

## 2023-04-15 DIAGNOSIS — I6523 Occlusion and stenosis of bilateral carotid arteries: Secondary | ICD-10-CM

## 2023-04-15 DIAGNOSIS — J4540 Moderate persistent asthma, uncomplicated: Secondary | ICD-10-CM

## 2023-04-15 DIAGNOSIS — R011 Cardiac murmur, unspecified: Secondary | ICD-10-CM

## 2023-04-15 DIAGNOSIS — R809 Proteinuria, unspecified: Secondary | ICD-10-CM

## 2023-04-15 DIAGNOSIS — E1165 Type 2 diabetes mellitus with hyperglycemia: Secondary | ICD-10-CM

## 2023-04-15 DIAGNOSIS — E701 Other hyperphenylalaninemias: Secondary | ICD-10-CM

## 2023-04-15 DIAGNOSIS — I152 Hypertension secondary to endocrine disorders: Secondary | ICD-10-CM

## 2023-04-23 ENCOUNTER — Telehealth: Payer: Self-pay

## 2023-04-23 DIAGNOSIS — K760 Fatty (change of) liver, not elsewhere classified: Secondary | ICD-10-CM

## 2023-04-23 NOTE — Telephone Encounter (Signed)
 Patient states he is taking the Rezdiffra  you have diarrhea really bad, feel light headed when he takes the medication. He has been taking the medication for around 3 weeks he thinks. He wants to know if he can be change to a different medication or what he should do. He sates he will feel bad for a couple of days after taking the medication when he takes it.

## 2023-04-24 MED ORDER — REZDIFFRA 60 MG PO TABS: 1.0000 | ORAL_TABLET | Freq: Every day | ORAL | 5 refills | Status: AC

## 2023-04-24 NOTE — Addendum Note (Signed)
 Addended by: Conny Del L on: 04/24/2023 01:42 PM   Modules accepted: Orders

## 2023-04-24 NOTE — Telephone Encounter (Signed)
 Recommend to check LFTs and decrease to 60mg  daily  RV

## 2023-04-24 NOTE — Telephone Encounter (Signed)
 Called and talk to patient wife and she verablized understanding. She states there car in the shop but hopefully will have it out by tomorrow. As soon as they get it out they will go get labs. Sent medication to the pharmacy. Sent rick a email with the changed

## 2023-04-26 ENCOUNTER — Other Ambulatory Visit
Admission: RE | Admit: 2023-04-26 | Discharge: 2023-04-26 | Disposition: A | Discharge status: Home / Self Care | Payer: Medicare Other | Attending: Gastroenterology | Admitting: Gastroenterology

## 2023-04-26 DIAGNOSIS — K760 Fatty (change of) liver, not elsewhere classified: Secondary | ICD-10-CM | POA: Diagnosis not present

## 2023-04-26 DIAGNOSIS — R7309 Other abnormal glucose: Secondary | ICD-10-CM | POA: Insufficient documentation

## 2023-04-26 LAB — BASIC METABOLIC PANEL
Anion gap: 8 (ref 5–15)
BUN: 11 mg/dL (ref 6–20)
CO2: 22 mmol/L (ref 22–32)
Calcium: 8.9 mg/dL (ref 8.9–10.3)
Chloride: 107 mmol/L (ref 98–111)
Creatinine, Ser: 0.78 mg/dL (ref 0.61–1.24)
GFR, Estimated: >60 mL/min (ref >60)
Glucose, Bld: 181 mg/dL — ABNORMAL HIGH (ref 70–99)
Potassium: 4.1 mmol/L (ref 3.5–5.1)
Sodium: 137 mmol/L (ref 135–145)

## 2023-04-26 LAB — HEPATIC FUNCTION PANEL
ALT: 39 U/L (ref 0–44)
AST: 23 U/L (ref 15–41)
Albumin: 4.2 g/dL (ref 3.5–5.0)
Alkaline Phosphatase: 103 U/L (ref 38–126)
Bilirubin, Direct: <0.1 mg/dL (ref 0.0–0.2)
Total Bilirubin: 0.6 mg/dL (ref 0.0–1.2)
Total Protein: 7 g/dL (ref 6.5–8.1)

## 2023-04-28 NOTE — Patient Instructions (Signed)

## 2023-04-30 ENCOUNTER — Telehealth: Payer: Self-pay

## 2023-04-30 ENCOUNTER — Ambulatory Visit (INDEPENDENT_AMBULATORY_CARE_PROVIDER_SITE_OTHER): Payer: Medicare Other | Admitting: Nurse Practitioner

## 2023-04-30 ENCOUNTER — Encounter: Payer: Self-pay | Admitting: Nurse Practitioner

## 2023-04-30 VITALS — BP 132/72 | HR 64 | Temp 97.4°F | Ht 64.0 in | Wt 168.4 lb

## 2023-04-30 DIAGNOSIS — Z794 Long term (current) use of insulin: Secondary | ICD-10-CM | POA: Diagnosis not present

## 2023-04-30 DIAGNOSIS — S61233A Puncture wound without foreign body of left middle finger without damage to nail, initial encounter: Secondary | ICD-10-CM

## 2023-04-30 DIAGNOSIS — E1165 Type 2 diabetes mellitus with hyperglycemia: Secondary | ICD-10-CM

## 2023-04-30 DIAGNOSIS — K760 Fatty (change of) liver, not elsewhere classified: Secondary | ICD-10-CM | POA: Diagnosis not present

## 2023-04-30 DIAGNOSIS — Z8673 Personal history of transient ischemic attack (TIA), and cerebral infarction without residual deficits: Secondary | ICD-10-CM | POA: Diagnosis not present

## 2023-04-30 DIAGNOSIS — W57XXXD Bitten or stung by nonvenomous insect and other nonvenomous arthropods, subsequent encounter: Secondary | ICD-10-CM

## 2023-04-30 DIAGNOSIS — I209 Angina pectoris, unspecified: Secondary | ICD-10-CM | POA: Diagnosis not present

## 2023-04-30 DIAGNOSIS — R011 Cardiac murmur, unspecified: Secondary | ICD-10-CM | POA: Diagnosis not present

## 2023-04-30 DIAGNOSIS — J4540 Moderate persistent asthma, uncomplicated: Secondary | ICD-10-CM

## 2023-04-30 DIAGNOSIS — I152 Hypertension secondary to endocrine disorders: Secondary | ICD-10-CM | POA: Diagnosis not present

## 2023-04-30 DIAGNOSIS — E701 Other hyperphenylalaninemias: Secondary | ICD-10-CM

## 2023-04-30 DIAGNOSIS — E785 Hyperlipidemia, unspecified: Secondary | ICD-10-CM

## 2023-04-30 DIAGNOSIS — E1129 Type 2 diabetes mellitus with other diabetic kidney complication: Secondary | ICD-10-CM

## 2023-04-30 DIAGNOSIS — E1169 Type 2 diabetes mellitus with other specified complication: Secondary | ICD-10-CM | POA: Diagnosis not present

## 2023-04-30 DIAGNOSIS — E1159 Type 2 diabetes mellitus with other circulatory complications: Secondary | ICD-10-CM

## 2023-04-30 LAB — BAYER DCA HB A1C WAIVED: HB A1C (BAYER DCA - WAIVED): 9.5 % — ABNORMAL HIGH (ref 4.8–5.6)

## 2023-04-30 LAB — MICROALBUMIN, URINE WAIVED
Creatinine, Urine Waived: 50 mg/dL (ref 10–300)
Microalb, Ur Waived: 10 mg/L (ref 0–19)

## 2023-04-30 MED ORDER — FENOFIBRATE 54 MG PO TABS: 54.0000 mg | ORAL_TABLET | Freq: Every day | ORAL | 4 refills | Status: AC

## 2023-04-30 MED ORDER — DOXYCYCLINE HYCLATE 100 MG PO TABS: 100.0000 mg | ORAL_TABLET | Freq: Two times a day (BID) | ORAL | 0 refills | Status: AC

## 2023-04-30 MED ORDER — CLOPIDOGREL BISULFATE 75 MG PO TABS: 75.0000 mg | ORAL_TABLET | Freq: Every day | ORAL | 4 refills | Status: AC

## 2023-04-30 MED ORDER — FLUTICASONE FUROATE-VILANTEROL 100-25 MCG/ACT IN AEPB: 1.0000 | INHALATION_SPRAY | Freq: Every day | RESPIRATORY_TRACT | 4 refills | Status: DC

## 2023-04-30 MED ORDER — SITAGLIPTIN PHOSPHATE 100 MG PO TABS: 100.0000 mg | ORAL_TABLET | Freq: Every day | ORAL | 4 refills | Status: DC

## 2023-04-30 MED ORDER — GABAPENTIN 300 MG PO CAPS: 300.0000 mg | ORAL_CAPSULE | Freq: Two times a day (BID) | ORAL | 4 refills | Status: AC

## 2023-04-30 MED ORDER — MONTELUKAST SODIUM 10 MG PO TABS
10.0000 mg | ORAL_TABLET | Freq: Every day | ORAL | 4 refills | Status: AC
Start: 2023-04-30 — End: ?

## 2023-04-30 MED ORDER — MUPIROCIN 2 % EX OINT: 1.0000 | TOPICAL_OINTMENT | Freq: Two times a day (BID) | CUTANEOUS | 3 refills | Status: DC

## 2023-04-30 MED ORDER — LISINOPRIL 20 MG PO TABS: 20.0000 mg | ORAL_TABLET | Freq: Every day | ORAL | 4 refills | Status: AC

## 2023-04-30 MED ORDER — EMPAGLIFLOZIN 25 MG PO TABS: 25.0000 mg | ORAL_TABLET | Freq: Every day | ORAL | 4 refills | Status: AC

## 2023-04-30 NOTE — Assessment & Plan Note (Signed)
Chronic, ongoing.  Continue Lipitor, Vascepa, and Fenofibrate. Lipid panel today.  Adjust doses as needed, if LDL >70.

## 2023-04-30 NOTE — Assessment & Plan Note (Signed)
Chronic, ongoing.  A1c 9.5% today which is slight trend down. Spoke to wife and him, discussed need to follow-up ASAP with Dr. Dario Guardian.  Urine ALB 29 April 2023, continue Lisinopril. - Continue Jardiance, Januvia, + insulin -- will increase Tresiba to 45 units BID until he returns to see endo. - Poor tolerance to Metformin in past with AKI presenting x 2 trials and allergic to Sulfa. Would avoid GLP at this time due to patient history of pancreatitis x 3, concern this would flare. Educated patient at length on effect of diabetes from head to toe and increased risk for recurrent CVA due to poor control.   - Recommend they check his BS TID -- he would benefit from [facility] will work on this with CCM team in future, however he prefers not to get at this time.  Has been working with pharmacist, SW, and nurse case manager as needed. - Goal A1C <6.5% due to CVA history -- which reiterated at length with patient today.  Discussed at length what an A1c at his current level could cause including wounds and worsening liver disease. - Foot and eye exam up to date. - Vaccinations up to date

## 2023-04-30 NOTE — Assessment & Plan Note (Signed)
Chronic, followed by cardiology, recent note reviewed.  Continue NTG as needed and collaboration with cardiology.  Denies CP today.

## 2023-04-30 NOTE — Progress Notes (Signed)
BP 132/72 (BP Location: Left Arm, Patient Position: Sitting, Cuff Size: Normal)   Pulse 64   Temp (!) 97.4 F (36.3 C) (Oral)   Ht 5\' 4"  (1.626 m)   Wt 168 lb 6.4 oz (76.4 kg)   SpO2 97%   BMI 28.91 kg/m    Subjective:    Patient ID: Alex Dam., male    DOB: [birth year], 63 y.o.   MRN: [id number]  HPI: Alex Wasco. is a 63 y.o. male  Chief Complaint  Patient presents with   Diabetes   Hyperlipidemia   Hypertension   DIABETES & PHENYLKETONURIA A1c last in October was 10.9%. He had seen Dr. Dario Guardian at end of last year, no notes in chart.  Has not returned to see since he was sick.  Can not go to [facility], not in network.  Saw Dr. Kerrie Pleasure in past but he retired, they had worked on insulin pump with him, but it was never started.  Attempted to get him into endo at [facility] or [place] in past, but due to ankle [place] and [place]'s [place].  Has ongoing poor control due to poor diet choices and frequent snacking.  Have educated him multiple times on this.  Continues [place], [place], Tresiba, and Humalog with sliding scale. Has had phenylketonuria since childhood and is not consistent with diet.    Has tried Metformin in past x 2 both times AKI presented, history of pancreatitis and can not take GLP1.  Hypoglycemic episodes:no Polydipsia/polyuria: no Visual disturbance: no Chest pain: no Paresthesias: no Glucose Monitoring: yes  Accucheck frequency: TID -- using glucometer reports better then 300, but some 200 -- did not bring book with him today  Fasting glucose:   Post prandial:   Evening:  Before meals: Taking Insulin?: yes  Long acting insulin: Tresiba 40 units BID  Short acting insulin: 42 units before meals Blood Pressure Monitoring: not checking Retinal Examination: Up to Date -- [place] Foot Exam: Up to Date Pneumovax: Up to Date Influenza: Up to Date Aspirin: yes   HYPERTENSION /  HYPERLIPIDEMIA 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 Had last visit with Dr. Malvin Johns on 11/09/19 (neurology) -- he is to continue Plavix and ASA per their note. Saw vascular on 10/08/22 last.  Cardiology seen on 03/01/23.  Current medications Lisinopril, Rosuvastatin, Fenofibrate, Vascepa, NTG.   Satisfied with current treatment? yes Duration of hypertension: chronic BP monitoring frequency: not checking BP range:  BP medication side effects: no Duration of hyperlipidemia: chronic Cholesterol medication side effects: no Cholesterol supplements: none Medication compliance: good compliance Aspirin: yes Recent stressors: no Recurrent headaches: no Visual changes: no Palpitations: no Dyspnea: no Chest pain: not often, works with cardiology Lower extremity edema: no Dizzy/lightheaded: no  ASTHMA Using Breo daily and Singulair.  Has Albuterol to use as needed. Asthma status: stable Satisfied with current treatment?: yes Albuterol/rescue inhaler frequency: none Dyspnea frequency: not if taking Breo Wheezing frequency: no Cough frequency: no Nocturnal symptom frequency: none Limitation of activity: no Current upper respiratory symptoms: no Aerochamber/spacer use: no Visits to ER or Urgent Care in past year: no Pneumovax: Up to Date Influenza: Up to Date  NAFLD Following with GI, last seen 02/11/23.  Currently treated with Rezdiffra, initially made him nauseous and he reports they had to lower dose. On review fibrosis Stage II. Fever: no Nausea: no Vomiting: no Weight loss: no Decreased appetite: no Diarrhea: no Constipation: no Blood in stool: no Heartburn:  no Jaundice: no Rash: no  Relevant past medical, surgical, family and social history reviewed and updated as indicated. Interim medical history since our last visit reviewed. Allergies and medications reviewed and updated.  Review of Systems  Constitutional:  Negative for activity change, diaphoresis, fatigue and  fever.  Respiratory:  Negative for cough, chest tightness, shortness of breath and wheezing.   Cardiovascular:  Negative for chest pain, palpitations and leg swelling.  Gastrointestinal: Negative.   Neurological: Negative.   Psychiatric/Behavioral: Negative.     Per HPI unless specifically indicated above     Objective:    BP 132/72 (BP Location: Left Arm, Patient Position: Sitting, Cuff Size: Normal)   Pulse 64   Temp (!) 97.4 F (36.3 C) (Oral)   Ht 5\' 4"  (1.626 m)   Wt 168 lb 6.4 oz (76.4 kg)   SpO2 97%   BMI 28.91 kg/m   Wt Readings from Last 3 Encounters:  04/30/23 168 lb 6.4 oz (76.4 kg)  03/01/23 165 lb 9.6 oz (75.1 kg)  02/11/23 170 lb 4 oz (77.2 kg)    Physical Exam Vitals and nursing note reviewed.  Constitutional:      General: He is awake. He is not in acute distress.    Appearance: Normal appearance. He is well-developed. He is obese. He is not ill-appearing.  HENT:     Head: Normocephalic and atraumatic.     Right Ear: Hearing and external ear normal. No drainage.     Left Ear: Hearing and external ear normal. No drainage.  Eyes:     General: Lids are normal.        Right eye: No discharge.        Left eye: No discharge.     Conjunctiva/sclera: Conjunctivae normal.     Pupils: Pupils are equal, round, and reactive to light.  Neck:     Thyroid: No thyromegaly.     Vascular: Carotid bruit (R>L) present.  Cardiovascular:     Rate and Rhythm: Regular rhythm. Bradycardia present.     Heart sounds: S1 normal and S2 normal. Murmur heard.     Systolic murmur is present with a grade of 2/6.     No gallop.     Comments: Systolic murmur noted best at left sternal border, soft. Pulmonary:     Effort: Pulmonary effort is normal. No accessory muscle usage or respiratory distress.     Breath sounds: Normal breath sounds.  Abdominal:     General: Bowel sounds are normal. There is no distension.     Palpations: Abdomen is soft.     Tenderness: There is no  abdominal tenderness.  Musculoskeletal:     Cervical back: Normal range of motion and neck supple.     Right lower leg: No edema.     Left lower leg: No edema.  Lymphadenopathy:     Cervical: No cervical adenopathy.  Skin:    General: Skin is warm and dry.     Capillary Refill: Capillary refill takes less than 2 seconds.     Findings: Wound (refer to notes) present.     Comments: Multiple old bed bug bites noted to both arms, some have been picked at and are now red in color and mild swelling.  Left middle finger with dark area of crusting (wife reports had sliver she removed to area).  Neurological:     Mental Status: He is alert and oriented to person, place, and time.     Deep Tendon Reflexes:  Reflexes are normal and symmetric.  Psychiatric:        Attention and Perception: Attention normal.        Mood and Affect: Mood normal.        Speech: Speech normal.        Behavior: Behavior normal. Behavior is cooperative.    Results for orders placed or performed in visit on 04/30/23  Bayer DCA Hb A1c Waived   Collection Time: 04/30/23  3:34 PM  Result Value Ref Range   HB A1C (BAYER DCA - WAIVED) 9.5 (H) 4.8 - 5.6 %  Microalbumin, Urine Waived   Collection Time: 04/30/23  3:34 PM  Result Value Ref Range   Microalb, Ur Waived 10 0 - 19 mg/L   Creatinine, Urine Waived 50 10 - 300 mg/dL   Microalb/Creat Ratio 30-300 (H) <30 mg/g      Assessment & Plan:   Problem List Items Addressed This Visit       Cardiovascular and Mediastinum   Angina pectoris (HCC)   Chronic, followed by cardiology, recent note reviewed.  Continue NTG as needed and collaboration with cardiology.  Denies CP today.      Relevant Medications   fenofibrate 54 MG tablet   lisinopril (ZESTRIL) 20 MG tablet   Hypertension associated with diabetes (HCC)   Chronic, stable.  BP at goal on recheck today.  Will continue Lisinopril 10 MG dosing, further adjust next visit if remains on mildly elevated, had reduced  in past due to occasional dizziness and low readings.  Labs: CMP.  Urine ALB 29 April 2023.  Recommend they check his BP at home at least daily and document + focus on DASH diet.  Continue to collaborate with cardiology.       Relevant Medications   fenofibrate 54 MG tablet   empagliflozin (JARDIANCE) 25 MG TABS tablet   lisinopril (ZESTRIL) 20 MG tablet   sitaGLIPtin (JANUVIA) 100 MG tablet   Other Relevant Orders   Bayer DCA Hb A1c Waived (Completed)   Microalbumin, Urine Waived (Completed)   Comprehensive metabolic panel     Respiratory   Asthma   Chronic, ongoing.  FEV1 70% on check in 2021.  Tolerating Breo, to continue to use daily.  Recommend using Albuterol as needed only if episodes of SOB present. Return in 3 months. Repeat spirometry next visit.      Relevant Medications   fluticasone furoate-vilanterol (BREO ELLIPTA) 100-25 MCG/ACT AEPB   montelukast (SINGULAIR) 10 MG tablet     Digestive   NAFLD (nonalcoholic fatty liver disease)   Ongoing.  Continue collaboration with GI provider as needed.  Labs up to date.        Endocrine   Hyperlipidemia associated with type 2 diabetes mellitus (HCC)   Chronic, ongoing.  Continue Lipitor, Vascepa, and Fenofibrate. Lipid panel today.  Adjust doses as needed, if LDL >70.      Relevant Medications   fenofibrate 54 MG tablet   empagliflozin (JARDIANCE) 25 MG TABS tablet   lisinopril (ZESTRIL) 20 MG tablet   sitaGLIPtin (JANUVIA) 100 MG tablet   Other Relevant Orders   Bayer DCA Hb A1c Waived (Completed)   Comprehensive metabolic panel   Lipid Panel w/o Chol/HDL Ratio   Type 2 diabetes mellitus with proteinuria (HCC)   Chronic, ongoing.  A1c 9.5% today which is slight trend down. Spoke to wife and him, discussed need to follow-up ASAP with Dr. Dario Guardian.  Urine ALB 29 April 2023, continue Lisinopril. - Continue Jardiance, Januvia, +  insulin -- will increase Tresiba to 45 units BID until he returns to see endo. - Poor  tolerance to Metformin in past with AKI presenting x 2 trials and allergic to Sulfa. Would avoid GLP at this time due to patient history of pancreatitis x 3, concern this would flare. Educated patient at length on effect of diabetes from head to toe and increased risk for recurrent CVA due to poor control.   - Recommend they check his BS TID -- he would benefit from [facility] will work on this with CCM team in future, however he prefers not to get at this time.  Has been working with pharmacist, SW, and nurse case manager as needed. - Goal A1C <6.5% due to CVA history -- which reiterated at length with patient today.  Discussed at length what an A1c at his current level could cause including wounds and worsening liver disease. - Foot and eye exam up to date. - Vaccinations up to date      Relevant Medications   empagliflozin (JARDIANCE) 25 MG TABS tablet   lisinopril (ZESTRIL) 20 MG tablet   sitaGLIPtin (JANUVIA) 100 MG tablet   Other Relevant Orders   Bayer DCA Hb A1c Waived (Completed)   Microalbumin, Urine Waived (Completed)   Uncontrolled diabetes mellitus with hyperglycemia, with long-term current use of insulin (HCC) - Primary   Chronic, ongoing.  A1c 9.5% today which is slight trend down. Spoke to wife and him, discussed need to follow-up ASAP with Dr. Dario Guardian.  Urine ALB 29 April 2023, continue Lisinopril. - Continue Jardiance, Januvia, + insulin -- will increase Tresiba to 45 units BID until he returns to see endo. - Poor tolerance to Metformin in past with AKI presenting x 2 trials and allergic to Sulfa. Would avoid GLP at this time due to patient history of pancreatitis x 3, concern this would flare. Educated patient at length on effect of diabetes from head to toe and increased risk for recurrent CVA due to poor control.   - Recommend they check his BS TID -- he would benefit from [facility] will work on this with CCM team in future, however he prefers not to get at this  time.  Has been working with pharmacist, SW, and nurse case manager as needed. - Goal A1C <6.5% due to CVA history -- which reiterated at length with patient today.  Discussed at length what an A1c at his current level could cause including wounds and worsening liver disease. - Foot and eye exam up to date. - Vaccinations up to date      Relevant Medications   empagliflozin (JARDIANCE) 25 MG TABS tablet   lisinopril (ZESTRIL) 20 MG tablet   sitaGLIPtin (JANUVIA) 100 MG tablet   Other Relevant Orders   Bayer DCA Hb A1c Waived (Completed)   Microalbumin, Urine Waived (Completed)     Other   Bed bug bite   Ongoing issues with wounds to both arms, scratches at bites and opens them -- he is aware not to do this.  Reports they are trying to clean house best they can.  Will start Doxycyline 100 MG BID and Mupirocin to apply to wounds daily.        Heart murmur   Grade 2/6 on auscultation, continue collaboration with cardiology.        History of CVA (cerebrovascular accident)   Continue collaboration with neurology and continue statin + ASA/Plavix.  Discussed goals BP <130/90, A1C < 6.5%, and LDL <55.  He  is poorly controlled with diabetes, much related to knowledge base and poor diet.        Phenylketonuria (PKU) (HCC)   Mental delays present, continue to work with SW and nurse as needed.      Puncture wound of left middle finger   After a sliver was removed by wife.  Will send in Doxycyline 100 MG BID and Mupirocin.  If not improved over next week then instructed wife and him to return to office.        Follow up plan: Return in about 3 months (around 07/28/2023) for T2DM, HTN/HLD -- sooner if finger does not improve.

## 2023-04-30 NOTE — Assessment & Plan Note (Signed)
After a sliver was removed by wife.  Will send in Doxycyline 100 MG BID and Mupirocin.  If not improved over next week then instructed wife and him to return to office.

## 2023-04-30 NOTE — Telephone Encounter (Signed)
-----   Message from [facility] sent at 04/29/2023 10:34 PM EST ----- Good to see that his LFTs are normal Continue rezdiffra 60 mg daily  RV

## 2023-04-30 NOTE — Assessment & Plan Note (Signed)
Chronic, stable.  BP at goal on recheck today.  Will continue Lisinopril 10 MG dosing, further adjust next visit if remains on mildly elevated, had reduced in past due to occasional dizziness and low readings.  Labs: CMP.  Urine ALB 29 April 2023.  Recommend they check his BP at home at least daily and document + focus on DASH diet.  Continue to collaborate with cardiology.

## 2023-04-30 NOTE — Assessment & Plan Note (Signed)
Grade 2/6 on auscultation, continue collaboration with cardiology.

## 2023-04-30 NOTE — Assessment & Plan Note (Signed)
Mental delays present, continue to work with SW and nurse as needed.

## 2023-04-30 NOTE — Assessment & Plan Note (Signed)
Continue collaboration with neurology and continue statin + ASA/Plavix.  Discussed goals BP <130/90, A1C < 6.5%, and LDL <55.  He is poorly controlled with diabetes, much related to knowledge base and poor diet.

## 2023-04-30 NOTE — Assessment & Plan Note (Signed)
Ongoing issues with wounds to both arms, scratches at bites and opens them -- he is aware not to do this.  Reports they are trying to clean house best they can.  Will start Doxycyline 100 MG BID and Mupirocin to apply to wounds daily.

## 2023-04-30 NOTE — Assessment & Plan Note (Signed)
Ongoing.  Continue collaboration with GI provider as needed.  Labs up to date.

## 2023-04-30 NOTE — Assessment & Plan Note (Signed)
Chronic, ongoing.  FEV1 70% on check in 2021.  Tolerating Breo, to continue to use daily.  Recommend using Albuterol as needed only if episodes of SOB present. Return in 3 months. Repeat spirometry next visit.

## 2023-04-30 NOTE — Telephone Encounter (Signed)
Per Mora Appl, Not yet.  I see calls are going out to him for 60mg  in case you may want to check in with him.  First fill shipped 1/15 or the 80mg .  He has low copay. Thank you!  Called patient and patient wife answer she verbalized understanding of results. She states she been having phone issues but will have him call [facility] to schedule shipment

## 2023-05-01 LAB — LIPID PANEL W/O CHOL/HDL RATIO
Cholesterol, Total: 218 mg/dL — ABNORMAL HIGH (ref 100–199)
HDL: 27 mg/dL — ABNORMAL LOW (ref >39)
Triglycerides: 854 mg/dL (ref 0–149)

## 2023-05-01 LAB — COMPREHENSIVE METABOLIC PANEL
ALT: 44 [IU]/L (ref 0–44)
AST: 25 [IU]/L (ref 0–40)
Albumin: 4.5 g/dL (ref 3.8–4.9)
Alkaline Phosphatase: 197 [IU]/L — ABNORMAL HIGH (ref 44–121)
BUN/Creatinine Ratio: 24 (ref 10–24)
BUN: 20 mg/dL (ref 8–27)
Bilirubin Total: 0.2 mg/dL (ref 0.0–1.2)
CO2: 17 mmol/L — ABNORMAL LOW (ref 20–29)
Calcium: 9.4 mg/dL (ref 8.6–10.2)
Chloride: 101 mmol/L (ref 96–106)
Creatinine, Ser: 0.84 mg/dL (ref 0.76–1.27)
Globulin, Total: 2.5 g/dL (ref 1.5–4.5)
Glucose: 375 mg/dL — ABNORMAL HIGH (ref 70–99)
Potassium: 4.4 mmol/L (ref 3.5–5.2)
Sodium: 136 mmol/L (ref 134–144)
Total Protein: 7 g/dL (ref 6.0–8.5)
eGFR: 100 mL/min/{1.73_m2} (ref >59)

## 2023-05-01 NOTE — Progress Notes (Signed)
Good afternoon crew, [name] will need another lab visit fasting in one week + need to let [name] and him know labs results: Good afternoon [name] and [name], labs have returned: - Lipid panel shows high triglycerides again.  I would like to recheck this in one week with you fasting prior to having lab drawn (no eating or drinking anything but water prior to lab).  Then I may need to adjust medications if ongoing elevations. - Ensure you call Dr. Dario Rogers and schedule diabetes follow-up. - Kidney and liver function are normal.  Sugar is elevated though, so please adjust regimen as I recommended.  If finger is not better in one week I want him to return to office to reassess.  If it gets worse then come in sooner.  Any questions? Keep being stellar!!  Thank you for allowing me to participate in your care.  I appreciate you. Kindest regards, [name]

## 2023-05-07 ENCOUNTER — Other Ambulatory Visit: Payer: Medicare Other

## 2023-05-07 DIAGNOSIS — E1169 Type 2 diabetes mellitus with other specified complication: Secondary | ICD-10-CM | POA: Diagnosis not present

## 2023-05-07 DIAGNOSIS — E785 Hyperlipidemia, unspecified: Secondary | ICD-10-CM | POA: Diagnosis not present

## 2023-05-08 ENCOUNTER — Telehealth: Payer: Self-pay

## 2023-05-08 LAB — LIPID PANEL W/O CHOL/HDL RATIO
Cholesterol, Total: 165 mg/dL (ref 100–199)
HDL: 28 mg/dL — ABNORMAL LOW (ref >39)
LDL Chol Calc (NIH): 78 mg/dL (ref 0–99)
Triglycerides: 369 mg/dL — ABNORMAL HIGH (ref 0–149)
VLDL Cholesterol Cal: 59 mg/dL — ABNORMAL HIGH (ref 5–40)

## 2023-05-08 NOTE — Telephone Encounter (Signed)
Per Dr. Allegra Lai Can you please check with him if his diarrhea improved on lowering his rezdiffra dose

## 2023-05-08 NOTE — Telephone Encounter (Signed)
Patient states he started the medication and he is not having diarrhea anymore.

## 2023-05-08 NOTE — Progress Notes (Signed)
Good morning crew, Please let [name] and Vinnie know labs have returned and triglycerides much better this check.  They are still elevated, but not to level of previous check.  For now continue Fenofibrate, Vascepa, and Rosuvastatin.  No changes needed at present.  Any questions? Keep being stellar!!  Thank you for allowing me to participate in your care.  I appreciate you. Kindest regards, Neita Landrigan

## 2023-05-09 ENCOUNTER — Ambulatory Visit: Payer: Self-pay | Admitting: *Deleted

## 2023-05-09 NOTE — Patient Outreach (Signed)
Care Coordination   05/09/2023 Name: [name]. MRN: [id number] DOB: October 22, 1960   Care Coordination Outreach Attempts:  An unsuccessful outreach was attempted for an appointment today.  Follow Up Plan:  Additional outreach attempts will be made to offer the patient complex care management information and services.   Encounter Outcome:  No Answer   Care Coordination Interventions:  No, not indicated    Rodney Langton, RN, MSN, CCM   [facility], [facility] RN Care Coordinator Direct Dial: [phone] / Main [phone] Fax [phone] Email: [email or web address]

## 2023-05-15 ENCOUNTER — Other Ambulatory Visit: Payer: Self-pay

## 2023-05-15 ENCOUNTER — Emergency Department: Payer: Medicare Other

## 2023-05-15 ENCOUNTER — Emergency Department
Admission: EM | Admit: 2023-05-15 | Discharge: 2023-05-15 | Disposition: A | Discharge status: Home / Self Care | Payer: Medicare Other | Attending: Emergency Medicine | Admitting: Emergency Medicine

## 2023-05-15 DIAGNOSIS — J45909 Unspecified asthma, uncomplicated: Secondary | ICD-10-CM | POA: Diagnosis not present

## 2023-05-15 DIAGNOSIS — K5730 Diverticulosis of large intestine without perforation or abscess without bleeding: Secondary | ICD-10-CM | POA: Diagnosis not present

## 2023-05-15 DIAGNOSIS — I10 Essential (primary) hypertension: Secondary | ICD-10-CM | POA: Diagnosis not present

## 2023-05-15 DIAGNOSIS — R1031 Right lower quadrant pain: Secondary | ICD-10-CM | POA: Diagnosis not present

## 2023-05-15 DIAGNOSIS — N281 Cyst of kidney, acquired: Secondary | ICD-10-CM | POA: Diagnosis not present

## 2023-05-15 DIAGNOSIS — K5901 Slow transit constipation: Secondary | ICD-10-CM | POA: Diagnosis not present

## 2023-05-15 DIAGNOSIS — E119 Type 2 diabetes mellitus without complications: Secondary | ICD-10-CM | POA: Diagnosis not present

## 2023-05-15 LAB — CBC
HCT: 42.3 % (ref 39.0–52.0)
Hemoglobin: 14.3 g/dL (ref 13.0–17.0)
MCH: 31.8 pg (ref 26.0–34.0)
MCHC: 33.8 g/dL (ref 30.0–36.0)
MCV: 94.2 fL (ref 80.0–100.0)
Platelets: 151 10*3/uL (ref 150–400)
RBC: 4.49 MIL/uL (ref 4.22–5.81)
RDW: 12.4 % (ref 11.5–15.5)
WBC: 4.4 10*3/uL (ref 4.0–10.5)
nRBC: 0 % (ref 0.0–0.2)

## 2023-05-15 LAB — COMPREHENSIVE METABOLIC PANEL
ALT: 44 U/L (ref 0–44)
AST: 30 U/L (ref 15–41)
Albumin: 4.1 g/dL (ref 3.5–5.0)
Alkaline Phosphatase: 96 U/L (ref 38–126)
Anion gap: 9 (ref 5–15)
BUN: 18 mg/dL (ref 6–20)
CO2: 22 mmol/L (ref 22–32)
Calcium: 9.3 mg/dL (ref 8.9–10.3)
Chloride: 107 mmol/L (ref 98–111)
Creatinine, Ser: 0.93 mg/dL (ref 0.61–1.24)
GFR, Estimated: >60 mL/min (ref >60)
Glucose, Bld: 189 mg/dL — ABNORMAL HIGH (ref 70–99)
Potassium: 4.1 mmol/L (ref 3.5–5.1)
Sodium: 138 mmol/L (ref 135–145)
Total Bilirubin: 0.7 mg/dL (ref 0.0–1.2)
Total Protein: 7.3 g/dL (ref 6.5–8.1)

## 2023-05-15 LAB — URINALYSIS, ROUTINE W REFLEX MICROSCOPIC
Bacteria, UA: NONE SEEN
Bilirubin Urine: NEGATIVE
Glucose, UA: >=500 mg/dL — AB
Hgb urine dipstick: NEGATIVE
Ketones, ur: NEGATIVE mg/dL
Leukocytes,Ua: NEGATIVE
Nitrite: NEGATIVE
Protein, ur: NEGATIVE mg/dL
RBC / HPF: 0 RBC/hpf (ref 0–5)
Specific Gravity, Urine: 1.02 (ref 1.005–1.030)
pH: 6 (ref 5.0–8.0)

## 2023-05-15 LAB — LIPASE, BLOOD: Lipase: 39 U/L (ref 11–51)

## 2023-05-15 MED ORDER — FENTANYL CITRATE PF 50 MCG/ML IJ SOSY
50.0000 ug | PREFILLED_SYRINGE | Freq: Once | INTRAMUSCULAR | Status: AC
  Administered 2023-05-15: 50 ug via INTRAVENOUS
  Filled 2023-05-15: qty 1

## 2023-05-15 MED ORDER — SENNOSIDES-DOCUSATE SODIUM 8.6-50 MG PO TABS: 1.0000 | ORAL_TABLET | Freq: Every day | ORAL | 0 refills | Status: DC

## 2023-05-15 MED ORDER — IOHEXOL 300 MG/ML  SOLN
100.0000 mL | Freq: Once | INTRAMUSCULAR | Status: AC | PRN
  Administered 2023-05-15: 100 mL via INTRAVENOUS

## 2023-05-15 NOTE — ED Provider Notes (Signed)
 [facility] Provider Note    Event Date/Time   First MD Initiated Contact with Patient 05/15/23 [phone]     (approximate)   History   Abdominal Pain   HPI Alex Enrique. is a 63 y.o. male with history of DM2, asthma, HTN, HLD presenting today for abdominal pain.  Patient states for the past 3 weeks he has had right lower quadrant abdominal pain.  Worsened in the last 24 hours.  Denies nausea, vomiting, diarrhea, constipation, chest pain, shortness of breath, dysuria, hematuria.  No prior abdominal surgeries.  Has not taken any medication for the symptoms.     Physical Exam   Triage Vital Signs: ED Triage Vitals  Encounter Vitals Group     BP 05/15/23 0753 (!) 189/99     Systolic BP Percentile --      Diastolic BP Percentile --      Pulse Rate 05/15/23 0753 60     Resp 05/15/23 0753 17     Temp 05/15/23 0753 98.6 F (37 C)     Temp Source 05/15/23 0753 Oral     SpO2 05/15/23 0753 97 %     Weight 05/15/23 0752 165 lb (74.8 kg)     Height 05/15/23 0752 5\' 4"  (1.626 m)     Head Circumference --      Peak Flow --      Pain Score 05/15/23 0752 10     Pain Loc --      Pain Education --      Exclude from Growth Chart --     Most recent vital signs: Vitals:   05/15/23 0753 05/15/23 0940  BP: (!) 189/99 (!) 157/86  Pulse: 60 (!) 55  Resp: 17 19  Temp: 98.6 F (37 C) 98.5 F (36.9 C)  SpO2: 97% 98%   Physical Exam: I have reviewed the vital signs and nursing notes. General: Awake, alert, no acute distress.  Nontoxic appearing. Head:  Atraumatic, normocephalic.   ENT:  EOM intact, PERRL. Oral mucosa is pink and moist with no lesions. Neck: Neck is supple with full range of motion, No meningeal signs. Cardiovascular:  RRR, No murmurs. Peripheral pulses palpable and equal bilaterally. Respiratory:  Symmetrical chest wall expansion.  No rhonchi, rales, or wheezes.  Good air movement throughout.  No use of accessory muscles.    Musculoskeletal:  No cyanosis or edema. Moving extremities with full ROM Abdomen:  Soft, tenderness palpation in the right lower quadrant, no CVA tenderness bilaterally, nondistended. Neuro:  GCS 15, moving all four extremities, interacting appropriately. Speech clear. Psych:  Calm, appropriate.   Skin:  Warm, dry, no rash.    ED Results / Procedures / Treatments   Labs (all labs ordered are listed, but only abnormal results are displayed) Labs Reviewed  COMPREHENSIVE METABOLIC PANEL - Abnormal; Notable for the following components:      Result Value   Glucose, Bld 189 (*)    All other components within normal limits  URINALYSIS, ROUTINE W REFLEX MICROSCOPIC - Abnormal; Notable for the following components:   Color, Urine STRAW (*)    APPearance CLEAR (*)    Glucose, UA >=500 (*)    All other components within normal limits  LIPASE, BLOOD  CBC     EKG    RADIOLOGY Independently interpreted CT imaging with no acute abnormalities.  There is evidence of large amount of stool burden   PROCEDURES:  Critical Care performed: No  Procedures   MEDICATIONS  ORDERED IN ED: Medications  fentaNYL (SUBLIMAZE) injection 50 mcg (50 mcg Intravenous Given 05/15/23 0826)  iohexol (OMNIPAQUE) 300 MG/ML solution 100 mL (100 mLs Intravenous Contrast Given 05/15/23 0908)     IMPRESSION / MDM / ASSESSMENT AND PLAN / ED COURSE  I reviewed the triage vital signs and the nursing notes.                              Differential diagnosis includes, but is not limited to, appendicitis, colitis, diverticulitis, enteritis, cholecystitis, less likely pyelonephritis or nephrolithiasis  Patient's presentation is most consistent with acute complicated illness / injury requiring diagnostic workup.  Patient is a 63 year old male presenting today for 3 weeks of right lower quadrant abdominal pain with no other associated symptoms.  Vital signs are stable on arrival but does have point tenderness in  the right lower quadrant.  Given fentanyl for pain control and will get laboratory workup as well as CT for further evaluation.  CT shows no acute pathology.  There is evidence of large amount of stool burden which likely can be contributing to his 3 weeks of symptoms.  Patient otherwise stable on reassessment.  Will discharge on a bowel regimen of Peri-Colace and MiraLAX.  Told to follow-up with PCP and given strict return precautions.     FINAL CLINICAL IMPRESSION(S) / ED DIAGNOSES   Final diagnoses:  Slow transit constipation     Rx / DC Orders   ED Discharge Orders          Ordered    senna-docusate (SENOKOT-S) 8.6-50 MG tablet  Daily        05/15/23 1043             Note:  This document was prepared using Dragon voice recognition software and may include unintentional dictation errors.   Janith Lima, MD 05/15/23 1045

## 2023-05-15 NOTE — ED Triage Notes (Signed)
 Pt sts that he has been having RLQ abd pain for the last three weeks. Pt sts that it has gotten a lot worse that he needed to come in.

## 2023-05-15 NOTE — Discharge Instructions (Signed)
 I have sent a medication to your pharmacy to help with constipation which she will take once daily.  I would also like you to pick up MiraLAX and take 2-3 capfuls per day until you are having frequent soft stools.  CT imaging of your belly otherwise showed no infection.  Please follow-up with your primary care provider in a week for reassessment.

## 2023-05-16 ENCOUNTER — Telehealth: Payer: Self-pay

## 2023-05-16 NOTE — Transitions of Care (Post Inpatient/ED Visit) (Signed)
   05/16/2023  Name: [name]. MRN: [id number] DOB: [birth year]  Today's TOC FU Call Status: Today's TOC FU Call Status:: Unsuccessful Call (1st Attempt) Unsuccessful Call (1st Attempt) Date: 05/16/23  Attempted to reach the patient regarding the most recent Inpatient/ED visit.  Follow Up Plan: Additional outreach attempts will be made to reach the patient to complete the Transitions of Care (Post Inpatient/ED visit) call.   Signature Karena Addison, LPN [facility] Nurse Health Advisor Direct Dial [phone]

## 2023-05-17 ENCOUNTER — Telehealth: Payer: Self-pay | Admitting: *Deleted

## 2023-05-17 NOTE — Progress Notes (Signed)
 Complex Care Management Care Guide Note  05/17/2023 Name: [name]. MRN: [id number] DOB: 1960/05/04  [name]. is a 63 y.o. year old male who is a primary care patient of Marjie Skiff, NP and is actively engaged with the care management team. I reached out to [facility]. by phone today to assist with re-scheduling  with the RN Case Manager.  Follow up plan: Unsuccessful telephone outreach attempt made. A HIPAA compliant phone message was left for the patient providing contact information and requesting a return call.  Burman Nieves, CMA, Care Guide [facility]  [facility], [facility] Guide Direct Dial: [phone]  Fax: [phone] Website: [email or web address]

## 2023-05-20 NOTE — Progress Notes (Signed)
 Complex Care Management Care Guide Note  05/20/2023 Name: [name]. MRN: [id number] DOB: 12/03/1960  [name] is a 63 y.o. year old male who is a primary care patient of Marjie Skiff, NP and is actively engaged with the care management team. I reached out to [facility]. by phone today to assist with re-scheduling  with the RN Case Manager.  Follow up plan: unsuccessful telephone outreach attempt made. A HIPAA compliant message was left for the patient providing contact information and requesting a return call. No additional outreach attempts will be made due to inability to maintain patient contact.   Burman Nieves, CMA, Care Guide [facility]  [facility], [facility] Guide Direct Dial: [phone]  Fax: [phone] Website: [email or web address]

## 2023-05-20 NOTE — Progress Notes (Signed)
 Complex Care Management Care Guide Note  05/20/2023 Name: [name]. MRN: [id number] DOB: May 19, 1960  [name]. is a 63 y.o. year old male who is a primary care patient of Marjie Skiff, NP and is actively engaged with the care management team. I reached out to [facility] by phone today to assist with re-scheduling  with the RN Case Manager.  Follow up plan: Telephone appointment with complex care management team member scheduled for:  05/31/2023  Burman Nieves, CMA, Care Guide [facility], [facility] Guide Direct Dial: [phone]  Fax: [phone] Website: [email or web address]

## 2023-05-21 NOTE — Transitions of Care (Post Inpatient/ED Visit) (Signed)
 05/21/2023  Name: [name]. MRN: [id number] DOB: November 10, 1960  Today's TOC FU Call Status: Today's TOC FU Call Status:: Successful TOC FU Call Completed Unsuccessful Call (1st Attempt) Date: 05/16/23 [facility] FU Call Complete Date: 05/21/23 Patient's Name and Date of Birth confirmed.  Transition Care Management Follow-up Telephone Call Date of Discharge: 05/15/23 Discharge Facility: [facility]) Type of Discharge: Emergency Department Reason for ED Visit: Other: (constipation) How have you been since you were released from the hospital?: Better Any questions or concerns?: No  Items Reviewed: Did you receive and understand the discharge instructions provided?: Yes Medications obtained,verified, and reconciled?: Yes (Medications Reviewed) Any new allergies since your discharge?: No Dietary orders reviewed?: Yes Do you have support at home?: Yes People in Home: spouse  Medications Reviewed Today: Medications Reviewed Today     Reviewed by Karena Addison, LPN (Licensed Practical Nurse) on 05/21/23 at 1604  Med List Status: <None>   Medication Order Taking? Sig Documenting Provider Last Dose Status Informant  Accu-Chek Softclix Lancets lancets [id number] No 1 each by Other route 5 (five) times daily. Use as instructed Aura Dials T, NP Taking Active   acetaminophen (TYLENOL) 500 MG tablet [id number] No Take 500 mg by mouth every 6 (six) hours as needed. [provider] Taking Active   albuterol (VENTOLIN HFA) 108 (90 Base) MCG/ACT inhaler [id number] No Inhale 2 puffs into the lungs every 6 (six) hours as needed. Aura Dials T, NP Taking Active Spouse/Significant Other  aspirin 81 MG EC tablet [id number] No Take 1 tablet (81 mg total) by mouth daily. Aura Dials T, NP Taking Active Spouse/Significant Other  Blood Glucose Monitoring Suppl (ACCU-CHEK AVIVA PLUS) w/Device KIT [id number] No 1 each by Does not apply route 5 (five) times  daily. Aura Dials T, NP Taking Active   clopidogrel (PLAVIX) 75 MG tablet [id number]  Take 1 tablet (75 mg total) by mouth daily. Aura Dials T, NP  Active   empagliflozin (JARDIANCE) 25 MG TABS tablet [id number]  Take 1 tablet (25 mg total) by mouth daily. Aura Dials T, NP  Active   EPINEPHrine 0.3 mg/0.3 mL IJ SOAJ injection [id number] No INJECT 1 SYRINGE INTO OUTER THIGH ONCE AS NEEDED FOR SEVERE ALLERGIC REACTION. Strength 0.3 MG/0.3ML Aura Dials T, NP Taking Active   fenofibrate 54 MG tablet [id number]  Take 1 tablet (54 mg total) by mouth daily. Cannady, Jolene T, NP  Active   fluticasone furoate-vilanterol (BREO ELLIPTA) 100-25 MCG/ACT AEPB [id number]  Inhale 1 puff into the lungs daily. Aura Dials T, NP  Active   gabapentin (NEURONTIN) 300 MG capsule [id number]  Take 1 capsule (300 mg total) by mouth 2 (two) times daily. Aura Dials T, NP  Active   glucose blood (ACCU-CHEK AVIVA PLUS) test strip [id number] No 1 each by Other route 5 (five) times daily. Use as instructed Marjie Skiff, NP Taking Active   HYDROcodone-acetaminophen (NORCO/VICODIN) 5-325 MG tablet [id number] No Take 1 tablet by mouth every 6 (six) hours as needed for moderate pain (pain score 4-6). [provider] Taking Active Spouse/Significant Other  icosapent Ethyl (VASCEPA) 1 g capsule [id number] No Take 1 capsule (1 g total) by mouth 2 (two) times daily. Aura Dials T, NP Taking Active Spouse/Significant Other  Insulin Degludec (TRESIBA) 100 UNIT/ML SOLN [id number] No Inject 40 Units into the skin 2 (two) times daily.  Patient taking differently: Inject 45 Units into the skin 2 (two) times daily.   Marjie Skiff, NP Taking Active  Spouse/Significant Other  Insulin Lispro Junior KwikPen (HUMALOG JR) 100 UNIT/ML KwikPen [id number] No Inject 42 Units into the skin 3 (three) times daily before meals. [provider] Taking Active Spouse/Significant Other  isosorbide mononitrate  (IMDUR) 30 MG 24 hr tablet [id number] No TAKE 1 TABLET BY MOUTH ONCE DAILY Laurier Nancy, MD Taking Active   lisinopril (ZESTRIL) 20 MG tablet [id number]  Take 1 tablet (20 mg total) by mouth daily. Marjie Skiff, NP  Active   Misc. Devices (PULSE OXIMETER FOR FINGER) MISC [id number] No To check O2 saturations once daily with asthma and document + check if any shortness of breath Cannady, Dorie Rank, NP Taking Active   Misc. Devices (PULSE OXIMETER) MISC [id number] No 1 each by Does not apply route every other day. Aura Dials T, NP Taking Active   montelukast (SINGULAIR) 10 MG tablet [id number]  Take 1 tablet (10 mg total) by mouth at bedtime. Aura Dials T, NP  Active   mupirocin ointment (BACTROBAN) 2 % [id number]  Apply 1 Application topically 2 (two) times daily. Aura Dials T, NP  Active   nitroGLYCERIN (NITROSTAT) 0.4 MG SL tablet [id number] No Place 1 tablet (0.4 mg total) under the tongue every 5 (five) minutes as needed for chest pain. Aura Dials T, NP Taking Active Spouse/Significant Other  pantoprazole (PROTONIX) 40 MG tablet [id number] No TAKE 1 TABLET BY MOUTH ONCE DAILY Laurier Nancy, MD Taking Active   Resmetirom (REZDIFFRA) 60 MG TABS [id number] No Take 1 tablet by mouth daily. Toney Reil, MD Taking Active   rosuvastatin (CRESTOR) 20 MG tablet [id number] No Take 1 tablet (20 mg total) by mouth daily. Aura Dials T, NP Taking Active   senna-docusate (SENOKOT-S) 8.6-50 MG tablet [id number]  Take 1 tablet by mouth daily. Janith Lima, MD  Active   sitaGLIPtin (JANUVIA) 100 MG tablet [id number]  Take 1 tablet (100 mg total) by mouth daily. Aura Dials T, NP  Active   sucralfate (CARAFATE) 1 g tablet [id number] No Take 1 tablet (1 g total) by mouth 4 (four) times daily. Laurier Nancy, MD Taking Active   triamcinolone cream (KENALOG) 0.1 % [id number] No Apply 1 Application topically 2 (two) times daily.  Patient taking differently: Apply 1 Application topically  2 (two) times daily. Arms and legs   Cannady, Jolene T, NP Taking Active Spouse/Significant Other  valACYclovir (VALTREX) 500 MG tablet [id number] No Take 500 mg by mouth 3 (three) times daily. [provider] Taking Active Spouse/Significant Other  Vitamin D, Ergocalciferol, (DRISDOL) 1.25 MG (50000 UNIT) CAPS capsule [id number] No Take 50,000 Units by mouth once a week. [provider] Taking Active Spouse/Significant Other           Med Note Marchelle Folks   Tue Oct 09, 2022 12:11 AM) Leanora Ivanoff on Mondays.            Home Care and Equipment/Supplies: Were Home Health Services Ordered?: NA Any new equipment or medical supplies ordered?: NA  Functional Questionnaire: Do you need assistance with bathing/showering or dressing?: No Do you need assistance with meal preparation?: No Do you need assistance with eating?: No Do you have difficulty maintaining continence: No Do you need assistance with getting out of bed/getting out of a chair/moving?: No Do you have difficulty managing or taking your medications?: No  Follow up appointments reviewed: PCP Follow-up appointment confirmed?: No (declined appt) MD Provider Line Number:[phone] Given: No Specialist Hospital Follow-up appointment confirmed?: NA Do you need transportation to  your follow-up appointment?: No Do you understand care options if your condition(s) worsen?: Yes-patient verbalized understanding    SIGNATURE Karena Addison, LPN [facility] Nurse Health Advisor Direct Dial [phone]

## 2023-05-27 DIAGNOSIS — N189 Chronic kidney disease, unspecified: Secondary | ICD-10-CM | POA: Diagnosis not present

## 2023-05-27 DIAGNOSIS — I10 Essential (primary) hypertension: Secondary | ICD-10-CM | POA: Diagnosis not present

## 2023-05-27 DIAGNOSIS — R5383 Other fatigue: Secondary | ICD-10-CM | POA: Diagnosis not present

## 2023-05-27 DIAGNOSIS — R945 Abnormal results of liver function studies: Secondary | ICD-10-CM | POA: Diagnosis not present

## 2023-05-27 DIAGNOSIS — E785 Hyperlipidemia, unspecified: Secondary | ICD-10-CM | POA: Diagnosis not present

## 2023-05-27 DIAGNOSIS — E1165 Type 2 diabetes mellitus with hyperglycemia: Secondary | ICD-10-CM | POA: Diagnosis not present

## 2023-05-28 ENCOUNTER — Encounter: Payer: Self-pay | Admitting: Cardiovascular Disease

## 2023-05-28 ENCOUNTER — Ambulatory Visit (INDEPENDENT_AMBULATORY_CARE_PROVIDER_SITE_OTHER): Payer: Medicare Other | Admitting: Cardiovascular Disease

## 2023-05-28 VITALS — BP 124/76 | HR 62 | Ht 64.0 in | Wt 164.0 lb

## 2023-05-28 DIAGNOSIS — I152 Hypertension secondary to endocrine disorders: Secondary | ICD-10-CM | POA: Diagnosis not present

## 2023-05-28 DIAGNOSIS — I259 Chronic ischemic heart disease, unspecified: Secondary | ICD-10-CM | POA: Diagnosis not present

## 2023-05-28 DIAGNOSIS — R0789 Other chest pain: Secondary | ICD-10-CM

## 2023-05-28 DIAGNOSIS — R55 Syncope and collapse: Secondary | ICD-10-CM

## 2023-05-28 DIAGNOSIS — I6523 Occlusion and stenosis of bilateral carotid arteries: Secondary | ICD-10-CM

## 2023-05-28 DIAGNOSIS — E1169 Type 2 diabetes mellitus with other specified complication: Secondary | ICD-10-CM

## 2023-05-28 DIAGNOSIS — E1159 Type 2 diabetes mellitus with other circulatory complications: Secondary | ICD-10-CM

## 2023-05-28 DIAGNOSIS — N189 Chronic kidney disease, unspecified: Secondary | ICD-10-CM | POA: Diagnosis not present

## 2023-05-28 DIAGNOSIS — I10 Essential (primary) hypertension: Secondary | ICD-10-CM | POA: Diagnosis not present

## 2023-05-28 DIAGNOSIS — E1165 Type 2 diabetes mellitus with hyperglycemia: Secondary | ICD-10-CM | POA: Diagnosis not present

## 2023-05-28 DIAGNOSIS — E785 Hyperlipidemia, unspecified: Secondary | ICD-10-CM | POA: Diagnosis not present

## 2023-05-28 MED ORDER — METOPROLOL TARTRATE 25 MG PO TABS: ORAL_TABLET | ORAL | 0 refills | Status: DC

## 2023-05-28 NOTE — Progress Notes (Signed)
 Cardiology Office Note   Date:  05/28/2023   ID:  [name], DOB Jul 12, 1960, MRN [id number]  PCP:  Marjie Skiff, NP  Cardiologist:  Adrian Blackwater, MD      History of Present Illness: [name]. is a 63 y.o. male who presents for  Chief Complaint  Patient presents with   Follow-up    1 Month Follow Up    Gets chest pain mowing yard  Chest Pain  This is a recurrent problem. The onset quality is sudden. The problem occurs 2 to 4 times per day. The pain is at a severity of 5/10. The quality of the pain is described as heavy.      Past Medical History:  Diagnosis Date   Arrhythmia    Asthma    CCC (chronic calculous cholecystitis)    COPD (chronic obstructive pulmonary disease) (HCC)    Cyst of kidney, acquired    Diabetes mellitus without complication (HCC) 2013   type 2   Diverticulosis    Edentulous    Fatty liver    Gallbladder polyp    GERD (gastroesophageal reflux disease)    Heart murmur    History of chicken pox    History of measles as a child    History of PKU    Hyperlipidemia    Hypertension    IBS (irritable bowel syndrome)    Irregular heart beat    Mentally challenged    Pancreatitis    PONV (postoperative nausea and vomiting)    Stroke (HCC) 03/02/2019   vision issues - right eye/right sided weakness   Vision impairment    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     Past Surgical History:  Procedure Laterality Date   Cardiac Catherization     [facility]   CARDIAC CATHETERIZATION     [facility]   CATARACT EXTRACTION W/PHACO Left 06/05/2021   Procedure: CATARACT EXTRACTION PHACO AND INTRAOCULAR LENS PLACEMENT (IOC) LEFT DIABETIC 3.75 00:28.0;  Surgeon: Nevada Crane, MD;  Location: [facility];  Service: Ophthalmology;  Laterality: Left;  Diabetic   CATARACT EXTRACTION W/PHACO Right 07/10/2021   Procedure: CATARACT EXTRACTION PHACO AND INTRAOCULAR LENS PLACEMENT (IOC) RIGHT DIABETIC;  Surgeon: Nevada Crane, MD;  Location: [facility];  Service: Ophthalmology;  Laterality: Right;  Diabetic 2.66 00:24.4   COLONOSCOPY     COLONOSCOPY WITH PROPOFOL N/A 08/10/2020   Procedure: COLONOSCOPY WITH PROPOFOL;  Surgeon: Toney Reil, MD;  Location: [facility] END[facility];  Service: Gastroenterology;  Laterality: N/A;  Has ankle monitor; (Parole officer "Ms Ward Givens needs a time before Monday [phone]" - per patient's wife)   ESOPHAGOGASTRODUODENOSCOPY (EGD) WITH PROPOFOL N/A 12/27/2016   Procedure: ESOPHAGOGASTRODUODENOSCOPY (EGD) WITH PROPOFOL;  Surgeon: Toney Reil, MD;  Location: [facility] END[facility];  Service: Gastroenterology;  Laterality: N/A;   HEMORRHOID SURGERY     KNEE SURGERY     cyst removed   Ligament Removal Left    of left thumb: dr. Alberteen Spindle   ligament removal  of left thumb     Dr. Alberteen Spindle   NM GATED MYOCARDIAL STUDY (ARMX HX)  06/23/2014   Paraschos. Normal   WISDOM TOOTH EXTRACTION       Current Outpatient Medications  Medication Sig Dispense Refill   metoprolol tartrate (LOPRESSOR) 25 MG tablet Take 1 tab night before and 1 tab 90 minutes prior cta coronaaries next morning only 2 tablet 0   Accu-Chek Softclix Lancets lancets 1 each by Other  route 5 (five) times daily. Use as instructed 200 each 12   acetaminophen (TYLENOL) 500 MG tablet Take 500 mg by mouth every 6 (six) hours as needed.     albuterol (VENTOLIN HFA) 108 (90 Base) MCG/ACT inhaler Inhale 2 puffs into the lungs every 6 (six) hours as needed. 18 g 0   aspirin 81 MG EC tablet Take 1 tablet (81 mg total) by mouth daily. 180 tablet 0   Blood Glucose Monitoring Suppl (ACCU-CHEK AVIVA PLUS) w/Device KIT 1 each by Does not apply route 5 (five) times daily. 1 kit 0   clopidogrel (PLAVIX) 75 MG tablet Take 1 tablet (75 mg total) by mouth daily. 90 tablet 4   empagliflozin (JARDIANCE) 25 MG TABS tablet Take 1 tablet (25 mg total) by mouth daily. 90 tablet 4   EPINEPHrine 0.3 mg/0.3 mL IJ SOAJ injection INJECT 1  SYRINGE INTO OUTER THIGH ONCE AS NEEDED FOR SEVERE ALLERGIC REACTION. Strength 0.3 MG/0.3ML 2 each 1   fenofibrate 54 MG tablet Take 1 tablet (54 mg total) by mouth daily. 90 tablet 4   fluticasone furoate-vilanterol (BREO ELLIPTA) 100-25 MCG/ACT AEPB Inhale 1 puff into the lungs daily. 60 each 4   gabapentin (NEURONTIN) 300 MG capsule Take 1 capsule (300 mg total) by mouth 2 (two) times daily. 180 capsule 4   glucose blood (ACCU-CHEK AVIVA PLUS) test strip 1 each by Other route 5 (five) times daily. Use as instructed 200 each 12   HYDROcodone-acetaminophen (NORCO/VICODIN) 5-325 MG tablet Take 1 tablet by mouth every 6 (six) hours as needed for moderate pain (pain score 4-6).     icosapent Ethyl (VASCEPA) 1 g capsule Take 1 capsule (1 g total) by mouth 2 (two) times daily. 180 capsule 4   Insulin Degludec (TRESIBA) 100 UNIT/ML SOLN Inject 40 Units into the skin 2 (two) times daily. (Patient taking differently: Inject 45 Units into the skin 2 (two) times daily.) 30 mL 4   Insulin Lispro Junior KwikPen (HUMALOG JR) 100 UNIT/ML KwikPen Inject 42 Units into the skin 3 (three) times daily before meals.     isosorbide mononitrate (IMDUR) 30 MG 24 hr tablet TAKE 1 TABLET BY MOUTH ONCE DAILY 30 tablet 1   lisinopril (ZESTRIL) 20 MG tablet Take 1 tablet (20 mg total) by mouth daily. 90 tablet 4   Misc. Devices (PULSE OXIMETER FOR FINGER) MISC To check O2 saturations once daily with asthma and document + check if any shortness of breath 1 each 1   Misc. Devices (PULSE OXIMETER) MISC 1 each by Does not apply route every other day. 1 each 0   montelukast (SINGULAIR) 10 MG tablet Take 1 tablet (10 mg total) by mouth at bedtime. 90 tablet 4   mupirocin ointment (BACTROBAN) 2 % Apply 1 Application topically 2 (two) times daily. 22 g 3   nitroGLYCERIN (NITROSTAT) 0.4 MG SL tablet Place 1 tablet (0.4 mg total) under the tongue every 5 (five) minutes as needed for chest pain. 50 tablet 3   pantoprazole (PROTONIX) 40  MG tablet TAKE 1 TABLET BY MOUTH ONCE DAILY 30 tablet 1   Resmetirom (REZDIFFRA) 60 MG TABS Take 1 tablet by mouth daily. 30 tablet 5   rosuvastatin (CRESTOR) 20 MG tablet Take 1 tablet (20 mg total) by mouth daily. 90 tablet 3   senna-docusate (SENOKOT-S) 8.6-50 MG tablet Take 1 tablet by mouth daily. 30 tablet 0   sitaGLIPtin (JANUVIA) 100 MG tablet Take 1 tablet (100 mg total) by mouth daily. 90  tablet 4   sucralfate (CARAFATE) 1 g tablet Take 1 tablet (1 g total) by mouth 4 (four) times daily. 120 tablet 1   triamcinolone cream (KENALOG) 0.1 % Apply 1 Application topically 2 (two) times daily. (Patient taking differently: Apply 1 Application topically 2 (two) times daily. Arms and legs) 30 g 1   valACYclovir (VALTREX) 500 MG tablet Take 500 mg by mouth 3 (three) times daily.     Vitamin D, Ergocalciferol, (DRISDOL) 1.25 MG (50000 UNIT) CAPS capsule Take 50,000 Units by mouth once a week.     No current facility-administered medications for this visit.    Allergies:   Sulfa antibiotics, Bee venom, Morphine, and Other    Social History:   reports that he has never smoked. He has never been exposed to tobacco smoke. He has never used smokeless tobacco. He reports that he does not drink alcohol and does not use drugs.   Family History:  family history includes Cancer in his maternal grandfather, maternal grandmother, mother, and paternal grandfather; Diabetes in his father; Heart disease in his father.    ROS:     Review of Systems  Constitutional: Negative.   HENT: Negative.    Eyes: Negative.   Respiratory: Negative.    Cardiovascular:  Positive for chest pain.  Gastrointestinal: Negative.   Genitourinary: Negative.   Musculoskeletal: Negative.   Skin: Negative.   Neurological: Negative.   Endo/Heme/Allergies: Negative.   Psychiatric/Behavioral: Negative.    All other systems reviewed and are negative.     All other systems are reviewed and negative.    PHYSICAL EXAM: VS:   BP 124/76   Pulse 62   Ht 5\' 4"  (1.626 m)   Wt 164 lb (74.4 kg)   SpO2 (!) 88%   BMI 28.15 kg/m  , BMI Body mass index is 28.15 kg/m. Last weight:  Wt Readings from Last 3 Encounters:  05/28/23 164 lb (74.4 kg)  05/15/23 165 lb (74.8 kg)  04/30/23 168 lb 6.4 oz (76.4 kg)     Physical Exam Vitals reviewed.  Constitutional:      Appearance: Normal appearance. He is normal weight.  HENT:     Head: Normocephalic.     Nose: Nose normal.     Mouth/Throat:     Mouth: Mucous membranes are moist.  Eyes:     Pupils: Pupils are equal, round, and reactive to light.  Cardiovascular:     Rate and Rhythm: Normal rate and regular rhythm.     Pulses: Normal pulses.     Heart sounds: Normal heart sounds.  Pulmonary:     Effort: Pulmonary effort is normal.  Abdominal:     General: Abdomen is flat. Bowel sounds are normal.  Musculoskeletal:        General: Normal range of motion.     Cervical back: Normal range of motion.  Skin:    General: Skin is warm.  Neurological:     General: No focal deficit present.     Mental Status: He is alert.  Psychiatric:        Mood and Affect: Mood normal.       EKG:   Recent Labs: 10/11/2022: Magnesium 2.3 01/28/2023: TSH 1.040 05/15/2023: ALT 44; BUN 18; Creatinine, Ser 0.93; Hemoglobin 14.3; Platelets 151; Potassium 4.1; Sodium 138    Lipid Panel    Component Value Date/Time   CHOL 165 05/07/2023 0851   CHOL 140 02/11/2023 1507   TRIG 369 (H) 05/07/2023 0851   TRIG  188 (H) 02/11/2023 1507   HDL 28 (L) 05/07/2023 0851   CHOLHDL 5.3 03/03/2019 0447   VLDL 44 (H) 03/03/2019 0447   VLDL 229 (H) 12/02/2018 1408   LDLCALC 78 05/07/2023 0851      Other studies Reviewed: Additional studies/ records that were reviewed today include:  Review of the above records demonstrates:       No data to display            ASSESSMENT AND PLAN:    ICD-10-CM   1. Hypertension associated with diabetes (HCC)  E11.59 CT CORONARY MORPH W/CTA  COR W/SCORE W/CA W/CM &/OR WO/CM   I15.2 Basic metabolic panel    metoprolol tartrate (LOPRESSOR) 25 MG tablet    2. Hyperlipidemia associated with type 2 diabetes mellitus (HCC)  E11.69 CT CORONARY MORPH W/CTA COR W/SCORE W/CA W/CM &/OR WO/CM   E78.5 Basic metabolic panel    metoprolol tartrate (LOPRESSOR) 25 MG tablet    3. Atherosclerosis of both carotid arteries  I65.23 CT CORONARY MORPH W/CTA COR W/SCORE W/CA W/CM &/OR WO/CM    Basic metabolic panel    metoprolol tartrate (LOPRESSOR) 25 MG tablet    4. Other chest pain  R07.89 CT CORONARY MORPH W/CTA COR W/SCORE W/CA W/CM &/OR WO/CM    Basic metabolic panel    metoprolol tartrate (LOPRESSOR) 25 MG tablet   had normal stress test 11/24, advise CCTA    5. Syncope, unspecified syncope type  R55 CT CORONARY MORPH W/CTA COR W/SCORE W/CA W/CM &/OR WO/CM    Basic metabolic panel    metoprolol tartrate (LOPRESSOR) 25 MG tablet    6. Chest pain due to myocardial ischemia, unspecified ischemic chest pain type  I25.9 CT CORONARY MORPH W/CTA COR W/SCORE W/CA W/CM &/OR WO/CM    Basic metabolic panel    metoprolol tartrate (LOPRESSOR) 25 MG tablet   has ischaemic chest pains, and will do CCTA       Problem List Items Addressed This Visit       Cardiovascular and Mediastinum   Hypertension associated with diabetes (HCC) - Primary   Relevant Medications   metoprolol tartrate (LOPRESSOR) 25 MG tablet   Other Relevant Orders   CT CORONARY MORPH W/CTA COR W/SCORE W/CA W/CM &/OR WO/CM   Basic metabolic panel   Atherosclerosis of both carotid arteries   Relevant Medications   metoprolol tartrate (LOPRESSOR) 25 MG tablet   Other Relevant Orders   CT CORONARY MORPH W/CTA COR W/SCORE W/CA W/CM &/OR WO/CM   Basic metabolic panel     Endocrine   Hyperlipidemia associated with type 2 diabetes mellitus (HCC)   Relevant Medications   metoprolol tartrate (LOPRESSOR) 25 MG tablet   Other Relevant Orders   CT CORONARY MORPH W/CTA COR W/SCORE  W/CA W/CM &/OR WO/CM   Basic metabolic panel   Other Visit Diagnoses       Other chest pain       had normal stress test 11/24, advise CCTA   Relevant Medications   metoprolol tartrate (LOPRESSOR) 25 MG tablet   Other Relevant Orders   CT CORONARY MORPH W/CTA COR W/SCORE W/CA W/CM &/OR WO/CM   Basic metabolic panel     Syncope, unspecified syncope type       Relevant Medications   metoprolol tartrate (LOPRESSOR) 25 MG tablet   Other Relevant Orders   CT CORONARY MORPH W/CTA COR W/SCORE W/CA W/CM &/OR WO/CM   Basic metabolic panel     Chest pain due to myocardial  ischemia, unspecified ischemic chest pain type       has ischaemic chest pains, and will do CCTA   Relevant Medications   metoprolol tartrate (LOPRESSOR) 25 MG tablet   Other Relevant Orders   CT CORONARY MORPH W/CTA COR W/SCORE W/CA W/CM &/OR WO/CM   Basic metabolic panel          Disposition:   Return in about 2 weeks (around 06/11/2023) for CCTa and f/u.    Total time spent: 30 minutes  Signed,  Adrian Blackwater, MD  05/28/2023 10:07 AM    [facility]

## 2023-05-28 NOTE — Patient Instructions (Addendum)
 You are going to have cta coronaries, and come and have labs in office 2 days before the test. Also pick up metoprolol 25 from pharmacy and take 1 tab night before and 1 tab 90 minutes next morning before cta cooronaries.

## 2023-05-31 ENCOUNTER — Ambulatory Visit: Payer: Self-pay | Admitting: *Deleted

## 2023-05-31 NOTE — Patient Outreach (Signed)
 Care Coordination   Follow Up Visit Note   05/31/2023 Name: [name] Rogers. MRN: [id number] DOB: [birth year]  [name] Rogers. is a 63 y.o. year old male who sees [name] Rogers, [name] Dandy T, NP for primary care. I spoke with wife of [name] Rogers. by phone today.  What matters to the patients health and wellness today?  Wife state patient is doing better with diabetes management, but hoping to be able to change providers for endocrinology.  Will have some cardiac testing soon, hoping there will be no blockages.  Denies any urgent concerns, encouraged to contact this care manager with questions.     Goals Addressed             This Visit's Progress    Effective management of chronic medical conditions   On track    Interventions Today    Flowsheet Row Most Recent Value  Chronic Disease   Chronic disease during today's visit Diabetes  General Interventions   General Interventions Discussed/Reviewed General Interventions Reviewed, Doctor Visits, Communication with, Labs, Durable Medical Equipment (DME)  Labs Hgb A1c every 3 months  [A1C much improved from 11.4 down to 9.5, but still over goal]  Doctor Visits Discussed/Reviewed Doctor Visits Reviewed, Specialist, PCP  [Upcoming for cardiac testing on 4/1, cardiology on 4/4, and AWV on 4/15. Aware of need for follow up with endocrinology, prefers to change providers, will discuss with PCP]  Durable Medical Equipment (DME) Glucomoter  PCP/Specialist Visits Compliance with follow-up visit  Communication with PCP/Specialists  [Spoke with PCP regarding request to change endocrinology providers, unable to do so at this time due to location restriction and insurance being out of network with [facility].]  Education Interventions   Education Provided Provided Education  Provided Verbal Education On Blood Sugar Monitoring, When to see the doctor, Medication  [Report blood sugars are better, now ranging in the 100s, still working to  improve. Meds reviewed, patient does not give his own insulin, wife advised to continue encouraging independence]              SDOH assessments and interventions completed:  No     Care Coordination Interventions:  Yes, provided   Follow up plan: Follow up call scheduled for 3/17    Encounter Outcome:  Patient Visit Completed   Rodney Langton, RN, MSN, CCM [place]  [facility] Health RN Care Coordinator Direct Dial: [phone] / Main [phone] Fax [phone] Email: [email or web address]: [email or web address]

## 2023-05-31 NOTE — Patient Outreach (Signed)
 Care Coordination   05/31/2023 Name: [name]. MRN: [id number] DOB: 22-May-1960   Care Coordination Outreach Attempts:  An unsuccessful outreach was attempted for an appointment today.  Follow Up Plan:  Additional outreach attempts will be made to offer the patient complex care management information and services.   Encounter Outcome:  No Answer   Care Coordination Interventions:  No, not indicated    Rodney Langton, RN, MSN, CCM [place]  [facility], [facility] Health RN Care Coordinator Direct Dial: [phone] / Main [phone] Fax [phone] Email: [email or web address]: [email or web address]

## 2023-06-03 ENCOUNTER — Ambulatory Visit: Payer: Self-pay | Admitting: *Deleted

## 2023-06-03 NOTE — Patient Instructions (Signed)
 Visit Information  Thank you for taking time to visit with me today. Please don't hesitate to contact me if I can be of assistance to you before our next scheduled telephone appointment.  Following are the goals we discussed today:  Remember to watch sugar and salt intake. Call [facility] to schedule follow up at [phone] Follow up with free diabetes classes at [facility].  Our next appointment is by telephone on 4/17 at 2pm  Please call the care guide team at [phone] if you need to cancel or reschedule your appointment.   Please call the Suicide and Crisis Lifeline: 988 call the [place] National Suicide Prevention Lifeline: (678) 186-9974 or TTY: 501-326-0541 TTY 719-041-9057) to talk to a trained counselor if you are experiencing a Mental Health or Behavioral Health Crisis or need someone to talk to.  The patient verbalized understanding of instructions, educational materials, and care plan provided today and agreed to receive a mailed copy of patient instructions, educational materials, and care plan.   The patient has been provided with contact information for the care management team and has been advised to call with any health related questions or concerns.   Rodney Langton, RN, MSN, CCM [facility], [facility] Health RN Care Coordinator Direct Dial: [phone] / Main [phone] Fax [phone] Email: [email or web address]: [email or web address]

## 2023-06-03 NOTE — Patient Outreach (Signed)
 Care Coordination   Follow Up Visit Note   06/03/2023 Name: [name]. MRN: [id number] DOB: [birth year]  [name]. is a 63 y.o. year old male who sees [name], Corrie Dandy T, NP for primary care. I spoke with wife of [name]. by phone today.  What matters to the patients health and wellness today?  Continues ongoing diabetes management.  She expresses gratitude with follow up regarding [name] being in network for patient, happy that he will be able to keep his old endocrinologist.    Goals Addressed             This Visit's Progress    Effective management of chronic medical conditions   On track    Interventions Today    Flowsheet Row Most Recent Value  Chronic Disease   Chronic disease during today's visit Diabetes  General Interventions   General Interventions Discussed/Reviewed General Interventions Reviewed, Doctor Visits, Communication with  Doctor Visits Discussed/Reviewed Doctor Visits Reviewed, Specialist  [Advised wife that [name] is able to accept patient's insurance, she will call to schedule follow up directly]  PCP/Specialist Visits Compliance with follow-up visit  Communication with PCP/Specialists  [facility] to confirm they are within network with patient's insurance]  Education Interventions   Education Provided Provided Education  Provided Verbal Education On Blood Sugar Monitoring, Nutrition  [Wife report blood sugars range 100s, will attend diabetes classes at [facility]]  Nutrition Interventions   Nutrition Discussed/Reviewed Decreasing sugar intake, Nutrition Reviewed  [Wife continues to work on having patient decrease sugar intake]              SDOH assessments and interventions completed:  No     Care Coordination Interventions:  Yes, provided   Follow up plan: Follow up call scheduled for 4/17 with F. McCray.    Encounter Outcome:  Patient Visit Completed   Rodney Langton, RN, MSN,  CCM [place]  [facility], [facility] Health RN Care Coordinator Direct Dial: [phone] / Main [phone] Fax [phone] Email: [email or web address]: [email or web address]

## 2023-06-04 ENCOUNTER — Other Ambulatory Visit

## 2023-06-11 ENCOUNTER — Ambulatory Visit: Admitting: Cardiovascular Disease

## 2023-06-14 ENCOUNTER — Other Ambulatory Visit

## 2023-06-14 DIAGNOSIS — I6523 Occlusion and stenosis of bilateral carotid arteries: Secondary | ICD-10-CM | POA: Diagnosis not present

## 2023-06-14 DIAGNOSIS — I152 Hypertension secondary to endocrine disorders: Secondary | ICD-10-CM | POA: Diagnosis not present

## 2023-06-14 DIAGNOSIS — E785 Hyperlipidemia, unspecified: Secondary | ICD-10-CM | POA: Diagnosis not present

## 2023-06-14 DIAGNOSIS — E1159 Type 2 diabetes mellitus with other circulatory complications: Secondary | ICD-10-CM | POA: Diagnosis not present

## 2023-06-14 DIAGNOSIS — E1169 Type 2 diabetes mellitus with other specified complication: Secondary | ICD-10-CM | POA: Diagnosis not present

## 2023-06-15 LAB — BASIC METABOLIC PANEL WITH GFR
BUN/Creatinine Ratio: 20 (ref 10–24)
BUN: 18 mg/dL (ref 8–27)
CO2: 22 mmol/L (ref 20–29)
Calcium: 9.3 mg/dL (ref 8.6–10.2)
Chloride: 107 mmol/L — ABNORMAL HIGH (ref 96–106)
Creatinine, Ser: 0.92 mg/dL (ref 0.76–1.27)
Glucose: 240 mg/dL — ABNORMAL HIGH (ref 70–99)
Potassium: 4.3 mmol/L (ref 3.5–5.2)
Sodium: 143 mmol/L (ref 134–144)
eGFR: 95 mL/min/{1.73_m2} (ref >59)

## 2023-06-18 ENCOUNTER — Other Ambulatory Visit

## 2023-06-18 ENCOUNTER — Ambulatory Visit: Admitting: Cardiovascular Disease

## 2023-06-21 ENCOUNTER — Ambulatory Visit: Admitting: Cardiovascular Disease

## 2023-06-21 ENCOUNTER — Other Ambulatory Visit: Payer: Self-pay | Admitting: Cardiovascular Disease

## 2023-06-21 DIAGNOSIS — K219 Gastro-esophageal reflux disease without esophagitis: Secondary | ICD-10-CM

## 2023-06-21 DIAGNOSIS — I152 Hypertension secondary to endocrine disorders: Secondary | ICD-10-CM

## 2023-06-21 DIAGNOSIS — I6523 Occlusion and stenosis of bilateral carotid arteries: Secondary | ICD-10-CM

## 2023-06-21 DIAGNOSIS — E1169 Type 2 diabetes mellitus with other specified complication: Secondary | ICD-10-CM

## 2023-06-21 DIAGNOSIS — R0789 Other chest pain: Secondary | ICD-10-CM

## 2023-06-21 DIAGNOSIS — I259 Chronic ischemic heart disease, unspecified: Secondary | ICD-10-CM

## 2023-06-21 DIAGNOSIS — E1165 Type 2 diabetes mellitus with hyperglycemia: Secondary | ICD-10-CM

## 2023-07-02 ENCOUNTER — Ambulatory Visit (INDEPENDENT_AMBULATORY_CARE_PROVIDER_SITE_OTHER): Payer: Self-pay | Admitting: Emergency Medicine

## 2023-07-02 VITALS — BP 128/78 | Ht 62.0 in | Wt 167.2 lb

## 2023-07-02 DIAGNOSIS — Z0000 Encounter for general adult medical examination without abnormal findings: Secondary | ICD-10-CM | POA: Diagnosis not present

## 2023-07-02 NOTE — Patient Instructions (Addendum)
 [name] , Thank you for taking time to come for your Medicare Wellness Visit. I appreciate your ongoing commitment to your health goals. Please review the following plan we discussed and let me know if I can assist you in the future.   Referrals/Orders/Follow-Ups/Clinician Recommendations: You can get the shingles vaccines at your local pharmacy. You need a diabetic foot exam. This can be done at your next OV on 07/31/23 with Jolene Cannady, NP  This is a list of the screening recommended for you and due dates:  Health Maintenance  Topic Date Due   Zoster (Shingles) Vaccine (1 of 2) Never done   Pneumococcal Vaccination (2 of 2 - PCV) 11/17/2016   COVID-19 Vaccine (4 - 2024-25 season) 11/18/2022   Complete foot exam   06/25/2023   Eye exam for diabetics  08/15/2023   Flu Shot  10/18/2023   Hemoglobin A1C  10/28/2023   Yearly kidney health urinalysis for diabetes  04/29/2024   Yearly kidney function blood test for diabetes  06/13/2024   Medicare Annual Wellness Visit  07/01/2024   Colon Cancer Screening  08/11/2027   DTaP/Tdap/Td vaccine (2 - Tdap) 01/19/2031   Hepatitis C Screening  Completed   HIV Screening  Completed   HPV Vaccine  Aged Out   Meningitis B Vaccine  Aged Out   Cologuard (Stool DNA test)  Discontinued    Advanced directives: (Declined) Advance directive discussed with you today. Even though you declined this today, please call our office should you change your mind, and we can give you the proper paperwork for you to fill out.  Next Medicare Annual Wellness Visit scheduled for next year: Yes, 07/14/24 @ 9:20am (in person)

## 2023-07-02 NOTE — Progress Notes (Signed)
 Subjective:   [name]. is a 63 y.o. who presents for a Medicare Wellness preventive visit.  Visit Complete: In person   Persons Participating in Visit: Patient.  AWV Questionnaire: No: Patient Medicare AWV questionnaire was not completed prior to this visit.  Cardiac Risk Factors include: advanced age (>37men, >59 women);male gender;hypertension;diabetes mellitus;dyslipidemia;obesity (BMI >30kg/m2)     Objective:    Today's Vitals   07/02/23 0927 07/02/23 0929  BP: 128/78   Weight: 167 lb 3.2 oz (75.8 kg)   Height: 5\' 2"  (1.575 m)   PainSc:  10-Worst pain ever   Body mass index is 30.58 kg/m.     07/02/2023    9:47 AM 05/15/2023    7:53 AM 10/09/2022    5:00 PM 10/08/2022    3:47 PM 06/26/2022    9:59 AM 04/02/2022    8:42 AM 03/30/2022   10:32 AM  Advanced Directives  Does Patient Have a Medical Advance Directive? No No  No No No No  Would patient like information on creating a medical advance directive? No - Patient declined  No - Patient declined  No - Patient declined No - Patient declined     Current Medications (verified) Outpatient Encounter Medications as of 07/02/2023  Medication Sig   Accu-Chek Softclix Lancets lancets 1 each by Other route 5 (five) times daily. Use as instructed   acetaminophen (TYLENOL) 500 MG tablet Take 500 mg by mouth every 6 (six) hours as needed.   albuterol (VENTOLIN HFA) 108 (90 Base) MCG/ACT inhaler Inhale 2 puffs into the lungs every 6 (six) hours as needed.   aspirin 81 MG EC tablet Take 1 tablet (81 mg total) by mouth daily.   Blood Glucose Monitoring Suppl (ACCU-CHEK AVIVA PLUS) w/Device KIT 1 each by Does not apply route 5 (five) times daily.   clopidogrel (PLAVIX) 75 MG tablet Take 1 tablet (75 mg total) by mouth daily.   empagliflozin (JARDIANCE) 25 MG TABS tablet Take 1 tablet (25 mg total) by mouth daily.   EPINEPHrine 0.3 mg/0.3 mL IJ SOAJ injection INJECT 1 SYRINGE INTO OUTER THIGH ONCE AS NEEDED FOR SEVERE  ALLERGIC REACTION. Strength 0.3 MG/0.3ML   fenofibrate 54 MG tablet Take 1 tablet (54 mg total) by mouth daily.   fluticasone furoate-vilanterol (BREO ELLIPTA) 100-25 MCG/ACT AEPB Inhale 1 puff into the lungs daily.   gabapentin (NEURONTIN) 300 MG capsule Take 1 capsule (300 mg total) by mouth 2 (two) times daily.   glucose blood (ACCU-CHEK AVIVA PLUS) test strip 1 each by Other route 5 (five) times daily. Use as instructed   icosapent Ethyl (VASCEPA) 1 g capsule Take 1 capsule (1 g total) by mouth 2 (two) times daily.   Insulin Degludec (TRESIBA) 100 UNIT/ML SOLN Inject 40 Units into the skin 2 (two) times daily. (Patient taking differently: Inject 45 Units into the skin 2 (two) times daily.)   isosorbide mononitrate (IMDUR) 30 MG 24 hr tablet TAKE 1 TABLET BY MOUTH ONCE DAILY   lisinopril (ZESTRIL) 20 MG tablet Take 1 tablet (20 mg total) by mouth daily.   Misc. Devices (PULSE OXIMETER FOR FINGER) MISC To check O2 saturations once daily with asthma and document + check if any shortness of breath   montelukast (SINGULAIR) 10 MG tablet Take 1 tablet (10 mg total) by mouth at bedtime.   mupirocin ointment (BACTROBAN) 2 % Apply 1 Application topically 2 (two) times daily.   nitroGLYCERIN (NITROSTAT) 0.4 MG SL tablet Place 1 tablet (0.4 mg  total) under the tongue every 5 (five) minutes as needed for chest pain.   pantoprazole (PROTONIX) 40 MG tablet TAKE 1 TABLET BY MOUTH ONCE DAILY   Resmetirom (REZDIFFRA) 60 MG TABS Take 1 tablet by mouth daily.   rosuvastatin (CRESTOR) 20 MG tablet Take 1 tablet (20 mg total) by mouth daily.   senna-docusate (SENOKOT-S) 8.6-50 MG tablet Take 1 tablet by mouth daily.   sitaGLIPtin (JANUVIA) 100 MG tablet Take 1 tablet (100 mg total) by mouth daily.   sucralfate (CARAFATE) 1 g tablet Take 1 tablet (1 g total) by mouth 4 (four) times daily.   triamcinolone cream (KENALOG) 0.1 % Apply 1 Application topically 2 (two) times daily. (Patient taking differently: Apply 1  Application topically 2 (two) times daily. Arms and legs)   valACYclovir (VALTREX) 500 MG tablet Take 500 mg by mouth 3 (three) times daily.   Vitamin D, Ergocalciferol, (DRISDOL) 1.25 MG (50000 UNIT) CAPS capsule Take 50,000 Units by mouth once a week.   HYDROcodone-acetaminophen (NORCO/VICODIN) 5-325 MG tablet Take 1 tablet by mouth every 6 (six) hours as needed for moderate pain (pain score 4-6). (Patient not taking: Reported on 07/02/2023)   Insulin Lispro Junior KwikPen (HUMALOG JR) 100 UNIT/ML KwikPen Inject 42 Units into the skin 3 (three) times daily before meals. (Patient not taking: Reported on 07/02/2023)   metoprolol tartrate (LOPRESSOR) 25 MG tablet Take 1 tab night before and 1 tab 90 minutes prior cta coronaaries next morning only (Patient not taking: Reported on 07/02/2023)   Misc. Devices (PULSE OXIMETER) MISC 1 each by Does not apply route every other day. (Patient not taking: Reported on 07/02/2023)   No facility-administered encounter medications on file as of 07/02/2023.    Allergies (verified) Sulfa antibiotics, Bee venom, Morphine, and Other   History: Past Medical History:  Diagnosis Date   Arrhythmia    Asthma    CCC (chronic calculous cholecystitis)    COPD (chronic obstructive pulmonary disease) (HCC)    Cyst of kidney, acquired    Diabetes mellitus without complication (HCC) 2013   type 2   Diverticulosis    Edentulous    Fatty liver    Gallbladder polyp    GERD (gastroesophageal reflux disease)    Heart murmur    History of chicken pox    History of measles as a child    History of PKU    Hyperlipidemia    Hypertension    IBS (irritable bowel syndrome)    Irregular heart beat    Mentally challenged    Pancreatitis    PONV (postoperative nausea and vomiting)    Stroke (HCC) 03/02/2019   vision issues - right eye/right sided weakness   Vision impairment    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   Past Surgical History:  Procedure Laterality Date    Cardiac Catherization     [facility]   CARDIAC CATHETERIZATION     [facility]   CATARACT EXTRACTION W/PHACO Left 06/05/2021   Procedure: CATARACT EXTRACTION PHACO AND INTRAOCULAR LENS PLACEMENT (IOC) LEFT DIABETIC 3.75 00:28.0;  Surgeon: Nevada Crane, MD;  Location: [facility];  Service: Ophthalmology;  Laterality: Left;  Diabetic   CATARACT EXTRACTION W/PHACO Right 07/10/2021   Procedure: CATARACT EXTRACTION PHACO AND INTRAOCULAR LENS PLACEMENT (IOC) RIGHT DIABETIC;  Surgeon: Nevada Crane, MD;  Location: [facility];  Service: Ophthalmology;  Laterality: Right;  Diabetic 2.66 00:24.4   COLONOSCOPY     COLONOSCOPY WITH PROPOFOL N/A 08/10/2020   Procedure: COLONOSCOPY  WITH PROPOFOL;  Surgeon: Selena Daily, MD;  Location: [facility];  Service: Gastroenterology;  Laterality: N/A;  Has ankle monitor; (Parole officer "Ms Amber Bail needs a time before Monday [phone]" - per patient's wife)   ESOPHAGOGASTRODUODENOSCOPY (EGD) WITH PROPOFOL N/A 12/27/2016   Procedure: ESOPHAGOGASTRODUODENOSCOPY (EGD) WITH PROPOFOL;  Surgeon: Selena Daily, MD;  Location: [facility];  Service: Gastroenterology;  Laterality: N/A;   HEMORRHOID SURGERY     KNEE SURGERY     cyst removed   Ligament Removal Left    of left thumb: dr. Roderic [place]   ligament removal  of left thumb     Dr. Roderic [place]   NM GATED MYOCARDIAL STUDY (ARMX HX)  06/23/2014   Paraschos. Normal   WISDOM TOOTH EXTRACTION     Family History  Problem Relation Age of Onset   Cancer Mother        throat   Diabetes Father    Heart disease Father    Cancer Maternal Grandmother    Cancer Maternal Grandfather        pancreatic   Cancer Paternal Grandfather    Social History   Socioeconomic History   Marital status: Married    Spouse name: [name]   Number of children: 0   Years of education: Not on file   Highest education level: 12th grade  Occupational History   Occupation: Disabled  Tobacco Use   Smoking  status: Never    Passive exposure: Never   Smokeless tobacco: Never  Vaping Use   Vaping status: Never Used  Substance and Sexual Activity   Alcohol use: No    Alcohol/week: 0.0 standard drinks of alcohol   Drug use: No   Sexual activity: Not Currently  Other Topics Concern   Not on file  Social History Narrative   ** Merged History Encounter **       ** Data from: 10/22/14 Enc Dept: [facility]       ** Data from: 10/07/14 Enc Dept: [facility]   Arrest: Arrested in 2012 for indecent liberties, required to wear ankle monitor   Social Drivers of Health   Financial Resource Strain: Low Risk  (07/02/2023)   Overall Financial Resource Strain (CARDIA)    Difficulty of Paying Living Expenses: Not hard at all  Food Insecurity: No Food Insecurity (07/02/2023)   Hunger Vital Sign    Worried About Running Out of Food in the Last Year: Never true    Ran Out of Food in the Last Year: Never true  Transportation Needs: No Transportation Needs (07/02/2023)   PRAPARE - Administrator, Civil Service (Medical): No    Lack of Transportation (Non-Medical): No  Physical Activity: Inactive (07/02/2023)   Exercise Vital Sign    Days of Exercise per Week: 0 days    Minutes of Exercise per Session: 0 min  Stress: No Stress Concern Present (07/02/2023)   [employer] of Occupational Health - Occupational Stress Questionnaire    Feeling of Stress : Not at all  Social Connections: Socially Isolated (07/02/2023)   Social Connection and Isolation Panel [NHANES]    Frequency of Communication with Friends and Family: Once a week    Frequency of Social Gatherings with Friends and Family: Once a week    Attends Religious Services: Never    Database administrator or Organizations: No    Attends Banker Meetings: Never    Marital Status: Married    Tobacco Counseling Counseling given: Not  Answered    Clinical Intake:  Pre-visit preparation completed:  Yes  Pain : 0-10 Pain Score: 10-Worst pain ever Pain Type: Chronic pain Pain Location: Wrist Pain Orientation: Left Pain Descriptors / Indicators: Aching     BMI - recorded: 30.58 Nutritional Status: BMI > 30  Obese Nutritional Risks: None Diabetes: Yes CBG done?: No Did pt. bring in CBG monitor from home?: No  Lab Results  Component Value Date   HGBA1C 9.5 (H) 04/30/2023   HGBA1C 11.4 (H) 01/28/2023   HGBA1C 10.9 (H) 01/01/2023     How often do you need to have someone help you when you read instructions, pamphlets, or other written materials from your doctor or pharmacy?: 1 - Never  Interpreter Needed?: No  Information entered by :: Tora Kindred, CMA   Activities of Daily Living     07/02/2023    9:33 AM 10/09/2022    4:34 PM  In your present state of health, do you have any difficulty performing the following activities:  Hearing? 0 0  Vision? 0 0  Difficulty concentrating or making decisions? 1 0  Walking or climbing stairs? 1 0  Comment due to lymphedema and neuropathy   Dressing or bathing? 0 0  Doing errands, shopping? 0 0  Preparing Food and eating ? N   Using the Toilet? N   In the past six months, have you accidently leaked urine? N   Do you have problems with loss of bowel control? N   Managing your Medications? Y   Comment wife fills pill box   Managing your Finances? N   Housekeeping or managing your Housekeeping? N     Patient Care Team: Marjie Skiff, NP as PCP - General (Nurse Practitioner) Isla Pence, OD (Optometry) Laurier Nancy, MD as Consulting Physician (Cardiology) [place], [place], LCSW as Social Worker Juanell Fairly, RN as Registered Nurse  Indicate any recent Medical Services you may have received from other than [facility] providers in the past year (date may be approximate).     Assessment:   This is a routine wellness examination for [place].  Hearing/Vision screen Hearing Screening - Comments:: Denies hearing  loss Vision Screening - Comments:: Gets DM eye exams, Dr. Julianne Rice Coolidge   Goals Addressed               This Visit's Progress     Diabetes Patient stated goal (pt-stated)        Control diabetes       Depression Screen     07/02/2023    9:44 AM 01/15/2023   10:29 AM 10/31/2022    2:17 PM 10/01/2022    1:20 PM 06/26/2022    9:56 AM 06/25/2022    5:18 PM 12/25/2021    5:05 PM  PHQ 2/9 Scores  PHQ - 2 Score 0 0 0 3 0 0 0  PHQ- 9 Score 2 6 5 15  0 0     Fall Risk     07/02/2023    9:48 AM 11/16/2022    4:02 PM 10/31/2022    2:15 PM 10/18/2022   10:45 AM 10/01/2022    1:20 PM  Fall Risk   Falls in the past year? 0 1 1 1 1   Number falls in past yr: 0 0 1 0 1  Injury with Fall? 0 1 1 1 1   Risk for fall due to : No Fall Risks History of fall(s);Impaired balance/gait Impaired balance/gait;History of fall(s)    Follow  up Falls prevention discussed;Falls evaluation completed Falls evaluation completed;Education provided;Falls prevention discussed;Follow up appointment Falls evaluation completed Falls evaluation completed;Education provided;Falls prevention discussed;Follow up appointment Falls evaluation completed    MEDICARE RISK AT HOME:  Medicare Risk at Home Any stairs in or around the home?: Yes (ramp on front of house) If so, are there any without handrails?: No Home free of loose throw rugs in walkways, pet beds, electrical cords, etc?: Yes Adequate lighting in your home to reduce risk of falls?: Yes Life alert?: No Use of a cane, walker or w/c?: No Grab bars in the bathroom?: Yes Shower chair or bench in shower?: Yes Elevated toilet seat or a handicapped toilet?: No  TIMED UP AND GO:  Was the test performed?  Yes  Length of time to ambulate 10 feet: 5  sec Gait steady and fast without use of assistive device  Cognitive Function: 6CIT completed        07/02/2023    9:50 AM 06/26/2022   10:07 AM 06/25/2022    5:18 PM 05/03/2021   12:14 PM 05/15/2016    1:39 PM   6CIT Screen  What Year? 0 points 0 points 0 points 0 points 0 points  What month? 0 points 0 points 0 points 0 points 3 points  What time? 0 points 0 points 0 points 0 points 3 points  Count back from 20 0 points 0 points 2 points 0 points 0 points  Months in reverse 4 points 0 points 2 points 0 points 0 points  Repeat phrase 4 points 0 points 0 points 0 points 10 points  Total Score 8 points 0 points 4 points 0 points 16 points    Immunizations Immunization History  Administered Date(s) Administered   Hepatitis A, Adult 09/26/2016, 05/22/2017   Hepatitis B, ADULT 09/26/2016, 05/22/2017, 11/29/2017   Hepb-cpg 02/18/2023, 03/18/2023   Influenza, Seasonal, Injecte, Preservative Fre 01/01/2023   Influenza,inj,Quad PF,6+ Mos 12/22/2012, 12/21/2013, 11/29/2017, 12/02/2018, 01/18/2021, 03/23/2022   Influenza-Unspecified 11/30/2014, 11/18/2015   Moderna Sars-Covid-2 Vaccination 11/16/2019, 12/14/2019, 12/14/2019   Pneumococcal Polysaccharide-23 12/22/2012, 11/18/2015   Td 01/18/2021    Screening Tests Health Maintenance  Topic Date Due   Zoster Vaccines- Shingrix (1 of 2) Never done   Pneumococcal Vaccine 75-77 Years old (2 of 2 - PCV) 11/17/2016   COVID-19 Vaccine (4 - 2024-25 season) 11/18/2022   FOOT EXAM  06/25/2023   OPHTHALMOLOGY EXAM  08/15/2023   INFLUENZA VACCINE  10/18/2023   HEMOGLOBIN A1C  10/28/2023   Diabetic kidney evaluation - Urine ACR  04/29/2024   Diabetic kidney evaluation - eGFR measurement  06/13/2024   Medicare Annual Wellness (AWV)  07/01/2024   Colonoscopy  08/11/2027   DTaP/Tdap/Td (2 - Tdap) 01/19/2031   Hepatitis C Screening  Completed   HIV Screening  Completed   HPV VACCINES  Aged Out   Meningococcal B Vaccine  Aged Out   Fecal DNA (Cologuard)  Discontinued    Health Maintenance  Health Maintenance Due  Topic Date Due   Zoster Vaccines- Shingrix (1 of 2) Never done   Pneumococcal Vaccine 8-67 Years old (2 of 2 - PCV) 11/17/2016   COVID-19  Vaccine (4 - 2024-25 season) 11/18/2022   FOOT EXAM  06/25/2023   Health Maintenance Items Addressed: See Nurse Notes  Additional Screening:  Vision Screening: Recommended annual ophthalmology exams for early detection of glaucoma and other disorders of the eye.  Dental Screening: Recommended annual dental exams for proper oral hygiene  Community Resource Referral /  Chronic Care Management: CRR required this visit?  No   CCM required this visit?  No     Plan:     I have personally reviewed and noted the following in the patient's chart:   Medical and social history Use of alcohol, tobacco or illicit drugs  Current medications and supplements including opioid prescriptions. Patient is not currently taking opioid prescriptions. Functional ability and status Nutritional status Physical activity Advanced directives List of other physicians Hospitalizations, surgeries, and ER visits in previous 12 months Vitals Screenings to include cognitive, depression, and falls Referrals and appointments  In addition, I have reviewed and discussed with patient certain preventive protocols, quality metrics, and best practice recommendations. A written personalized care plan for preventive services as well as general preventive health recommendations were provided to patient.     Jaunita Messier, CMA   07/02/2023   After Visit Summary: (In Person-Printed) AVS printed and given to the patient  Notes:  6 CIT Score - 8 Needs shingles vaccines (pharmacy) Needs DM foot at next OV on 07/31/23 Declined Dm & Nutrition education Declined Covid vaccine

## 2023-07-04 ENCOUNTER — Other Ambulatory Visit: Payer: Self-pay

## 2023-07-04 ENCOUNTER — Telehealth: Payer: Self-pay

## 2023-07-05 NOTE — Patient Outreach (Signed)
 Please Disregard: Patient's spouse contacted Nurse Case Manager to reschedule outreach.

## 2023-07-23 ENCOUNTER — Ambulatory Visit: Payer: Medicare Other | Admitting: Nurse Practitioner

## 2023-07-23 NOTE — Progress Notes (Unsigned)
 Patient was 1 month early for Hepatitis B vaccine so canceled nurse visit

## 2023-07-27 NOTE — Patient Instructions (Incomplete)

## 2023-07-30 ENCOUNTER — Ambulatory Visit: Payer: Medicare Other | Admitting: Nurse Practitioner

## 2023-07-31 ENCOUNTER — Ambulatory Visit: Admitting: Nurse Practitioner

## 2023-07-31 DIAGNOSIS — E1159 Type 2 diabetes mellitus with other circulatory complications: Secondary | ICD-10-CM

## 2023-07-31 DIAGNOSIS — I209 Angina pectoris, unspecified: Secondary | ICD-10-CM

## 2023-07-31 DIAGNOSIS — E1165 Type 2 diabetes mellitus with hyperglycemia: Secondary | ICD-10-CM

## 2023-07-31 DIAGNOSIS — E1129 Type 2 diabetes mellitus with other diabetic kidney complication: Secondary | ICD-10-CM

## 2023-07-31 DIAGNOSIS — Z6829 Body mass index (BMI) 29.0-29.9, adult: Secondary | ICD-10-CM

## 2023-07-31 DIAGNOSIS — Z8673 Personal history of transient ischemic attack (TIA), and cerebral infarction without residual deficits: Secondary | ICD-10-CM

## 2023-07-31 DIAGNOSIS — E1169 Type 2 diabetes mellitus with other specified complication: Secondary | ICD-10-CM

## 2023-07-31 DIAGNOSIS — E701 Other hyperphenylalaninemias: Secondary | ICD-10-CM

## 2023-07-31 DIAGNOSIS — R011 Cardiac murmur, unspecified: Secondary | ICD-10-CM

## 2023-08-05 ENCOUNTER — Ambulatory Visit: Admitting: Cardiovascular Disease

## 2023-08-05 ENCOUNTER — Encounter: Payer: Self-pay | Admitting: Cardiovascular Disease

## 2023-08-05 VITALS — BP 130/78 | HR 68 | Ht 64.0 in | Wt 170.4 lb

## 2023-08-05 DIAGNOSIS — E1159 Type 2 diabetes mellitus with other circulatory complications: Secondary | ICD-10-CM

## 2023-08-05 DIAGNOSIS — R55 Syncope and collapse: Secondary | ICD-10-CM

## 2023-08-05 DIAGNOSIS — E785 Hyperlipidemia, unspecified: Secondary | ICD-10-CM | POA: Diagnosis not present

## 2023-08-05 DIAGNOSIS — R0789 Other chest pain: Secondary | ICD-10-CM

## 2023-08-05 DIAGNOSIS — E1169 Type 2 diabetes mellitus with other specified complication: Secondary | ICD-10-CM

## 2023-08-05 DIAGNOSIS — I6523 Occlusion and stenosis of bilateral carotid arteries: Secondary | ICD-10-CM | POA: Diagnosis not present

## 2023-08-05 DIAGNOSIS — I152 Hypertension secondary to endocrine disorders: Secondary | ICD-10-CM

## 2023-08-05 NOTE — Progress Notes (Signed)
 Cardiology Office Note   Date:  08/05/2023   ID:  Alex Leaks., DOB November 09, 1960, MRN [id number]  PCP:  Lemar Pyles, NP  Cardiologist:  Debborah Fairly, MD      History of Present Illness: Alex Pantaleo. is a 63 y.o. male who presents for  Chief Complaint  Patient presents with   Follow-up    Discuss ccta options    Occasional chest pain and SOB      Past Medical History:  Diagnosis Date   Arrhythmia    Asthma    CCC (chronic calculous cholecystitis)    COPD (chronic obstructive pulmonary disease) (HCC)    Cyst of kidney, acquired    Diabetes mellitus without complication (HCC) 2013   type 2   Diverticulosis    Edentulous    Fatty liver    Gallbladder polyp    GERD (gastroesophageal reflux disease)    Heart murmur    History of chicken pox    History of measles as a child    History of PKU    Hyperlipidemia    Hypertension    IBS (irritable bowel syndrome)    Irregular heart beat    Mentally challenged    Pancreatitis    PONV (postoperative nausea and vomiting)    Stroke (HCC) 03/02/2019   vision issues - right eye/right sided weakness   Vision impairment    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     Past Surgical History:  Procedure Laterality Date   Cardiac Catherization     [facility]   CARDIAC CATHETERIZATION     [facility]   CATARACT EXTRACTION W/PHACO Left 06/05/2021   Procedure: CATARACT EXTRACTION PHACO AND INTRAOCULAR LENS PLACEMENT (IOC) LEFT DIABETIC 3.75 00:28.0;  Surgeon: Rosa College, MD;  Location: [facility];  Service: Ophthalmology;  Laterality: Left;  Diabetic   CATARACT EXTRACTION W/PHACO Right 07/10/2021   Procedure: CATARACT EXTRACTION PHACO AND INTRAOCULAR LENS PLACEMENT (IOC) RIGHT DIABETIC;  Surgeon: Rosa College, MD;  Location: [facility];  Service: Ophthalmology;  Laterality: Right;  Diabetic 2.66 00:24.4   COLONOSCOPY     COLONOSCOPY WITH PROPOFOL  N/A 08/10/2020   Procedure:  COLONOSCOPY WITH PROPOFOL ;  Surgeon: Selena Daily, MD;  Location: [facility] END[facility]COPY;  Service: Gastroenterology;  Laterality: N/A;  Has ankle monitor; (Parole officer "Ms Amber Bail needs a time before Monday [phone]" - per patient's wife)   ESOPHAGOGASTRODUODENOSCOPY (EGD) WITH PROPOFOL  N/A 12/27/2016   Procedure: ESOPHAGOGASTRODUODENOSCOPY (EGD) WITH PROPOFOL ;  Surgeon: Selena Daily, MD;  Location: [facility] ENDOSCOPY;  Service: Gastroenterology;  Laterality: N/A;   HEMORRHOID SURGERY     KNEE SURGERY     cyst removed   Ligament Removal Left    of left thumb: dr. Roderic [place]   ligament removal  of left thumb     Dr. Roderic [place]   NM GATED MYOCARDIAL STUDY (ARMX HX)  06/23/2014   Paraschos. Normal   WISDOM TOOTH EXTRACTION       Current Outpatient Medications  Medication Sig Dispense Refill   Accu-Chek Softclix Lancets lancets 1 each by Other route 5 (five) times daily. Use as instructed 200 each 12   acetaminophen  (TYLENOL ) 500 MG tablet Take 500 mg by mouth every 6 (six) hours as needed.     albuterol  (VENTOLIN  HFA) 108 (90 Base) MCG/ACT inhaler Inhale 2 puffs into the lungs every 6 (six) hours as needed. 18 g 0   aspirin  81 MG EC tablet Take 1 tablet (81 mg  total) by mouth daily. 180 tablet 0   Blood Glucose Monitoring Suppl (ACCU-CHEK AVIVA PLUS) w/Device KIT 1 each by Does not apply route 5 (five) times daily. 1 kit 0   clopidogrel  (PLAVIX ) 75 MG tablet Take 1 tablet (75 mg total) by mouth daily. 90 tablet 4   empagliflozin  (JARDIANCE ) 25 MG TABS tablet Take 1 tablet (25 mg total) by mouth daily. 90 tablet 4   EPINEPHrine  0.3 mg/0.3 mL IJ SOAJ injection INJECT 1 SYRINGE INTO OUTER THIGH ONCE AS NEEDED FOR SEVERE ALLERGIC REACTION. Strength 0.3 MG/0.3ML 2 each 1   fenofibrate  54 MG tablet Take 1 tablet (54 mg total) by mouth daily. 90 tablet 4   fluticasone  furoate-vilanterol (BREO ELLIPTA ) 100-25 MCG/ACT AEPB Inhale 1 puff into the lungs daily. 60 each 4   gabapentin  (NEURONTIN ) 300 MG  capsule Take 1 capsule (300 mg total) by mouth 2 (two) times daily. 180 capsule 4   glucose blood (ACCU-CHEK AVIVA PLUS) test strip 1 each by Other route 5 (five) times daily. Use as instructed 200 each 12   HYDROcodone -acetaminophen  (NORCO/VICODIN) 5-325 MG tablet Take 1 tablet by mouth every 6 (six) hours as needed for moderate pain (pain score 4-6). (Patient not taking: Reported on 07/02/2023)     icosapent  Ethyl (VASCEPA ) 1 g capsule Take 1 capsule (1 g total) by mouth 2 (two) times daily. 180 capsule 4   Insulin  Degludec (TRESIBA ) 100 UNIT/ML SOLN Inject 40 Units into the skin 2 (two) times daily. (Patient taking differently: Inject 45 Units into the skin 2 (two) times daily.) 30 mL 4   Insulin  Lispro Junior KwikPen (HUMALOG  JR) 100 UNIT/ML KwikPen Inject 42 Units into the skin 3 (three) times daily before meals. (Patient not taking: Reported on 07/02/2023)     isosorbide  mononitrate (IMDUR ) 30 MG 24 hr tablet TAKE 1 TABLET BY MOUTH ONCE DAILY 30 tablet 1   lisinopril  (ZESTRIL ) 20 MG tablet Take 1 tablet (20 mg total) by mouth daily. 90 tablet 4   metoprolol  tartrate (LOPRESSOR ) 25 MG tablet Take 1 tab night before and 1 tab 90 minutes prior cta coronaaries next morning only (Patient not taking: Reported on 07/02/2023) 2 tablet 0   Misc. Devices (PULSE OXIMETER FOR FINGER) MISC To check O2 saturations once daily with asthma and document + check if any shortness of breath 1 each 1   Misc. Devices (PULSE OXIMETER) MISC 1 each by Does not apply route every other day. (Patient not taking: Reported on 07/02/2023) 1 each 0   montelukast  (SINGULAIR ) 10 MG tablet Take 1 tablet (10 mg total) by mouth at bedtime. 90 tablet 4   mupirocin  ointment (BACTROBAN ) 2 % Apply 1 Application topically 2 (two) times daily. 22 g 3   nitroGLYCERIN  (NITROSTAT ) 0.4 MG SL tablet Place 1 tablet (0.4 mg total) under the tongue every 5 (five) minutes as needed for chest pain. 50 tablet 3   pantoprazole  (PROTONIX ) 40 MG tablet TAKE  1 TABLET BY MOUTH ONCE DAILY 30 tablet 1   Resmetirom  (REZDIFFRA ) 60 MG TABS Take 1 tablet by mouth daily. 30 tablet 5   rosuvastatin  (CRESTOR ) 20 MG tablet Take 1 tablet (20 mg total) by mouth daily. 90 tablet 3   senna-docusate (SENOKOT-S) 8.6-50 MG tablet Take 1 tablet by mouth daily. 30 tablet 0   sitaGLIPtin  (JANUVIA ) 100 MG tablet Take 1 tablet (100 mg total) by mouth daily. 90 tablet 4   sucralfate  (CARAFATE ) 1 g tablet Take 1 tablet (1 g total) by mouth  4 (four) times daily. 120 tablet 1   triamcinolone  cream (KENALOG ) 0.1 % Apply 1 Application topically 2 (two) times daily. (Patient taking differently: Apply 1 Application topically 2 (two) times daily. Arms and legs) 30 g 1   valACYclovir  (VALTREX ) 500 MG tablet Take 500 mg by mouth 3 (three) times daily.     Vitamin D , Ergocalciferol , (DRISDOL ) 1.25 MG (50000 UNIT) CAPS capsule Take 50,000 Units by mouth once a week.     No current facility-administered medications for this visit.    Allergies:   Sulfa antibiotics, Bee venom, Morphine, and Other    Social History:   reports that [name] has never smoked. [name] has never been exposed to tobacco smoke. [name] has never used smokeless tobacco. [name] reports that [name] does not drink alcohol and does not use drugs.   Family History:  family history includes Cancer in his maternal grandfather, maternal grandmother, mother, and paternal grandfather; Diabetes in his father; Heart disease in his father.    ROS:     Review of Systems  Constitutional: Negative.   HENT: Negative.    Eyes: Negative.   Respiratory: Negative.    Gastrointestinal: Negative.   Genitourinary: Negative.   Musculoskeletal: Negative.   Skin: Negative.   Neurological: Negative.   Endo/Heme/Allergies: Negative.   Psychiatric/Behavioral: Negative.    All other systems reviewed and are negative.     All other systems are reviewed and negative.    PHYSICAL EXAM: VS:  BP 130/78   Pulse 68   Ht 5\' 4"  (1.626 m)   Wt 170 lb  6.4 oz (77.3 kg)   SpO2 94%   BMI 29.25 kg/m  , BMI Body mass index is 29.25 kg/m. Last weight:  Wt Readings from Last 3 Encounters:  08/05/23 170 lb 6.4 oz (77.3 kg)  07/02/23 167 lb 3.2 oz (75.8 kg)  05/28/23 164 lb (74.4 kg)     Physical Exam Vitals reviewed.  Constitutional:      Appearance: Normal appearance. [name] is normal weight.  HENT:     Head: Normocephalic.     Nose: Nose normal.     Mouth/Throat:     Mouth: Mucous membranes are moist.  Eyes:     Pupils: Pupils are equal, round, and reactive to light.  Cardiovascular:     Rate and Rhythm: Normal rate and regular rhythm.     Pulses: Normal pulses.     Heart sounds: Normal heart sounds.  Pulmonary:     Effort: Pulmonary effort is normal.  Abdominal:     General: Abdomen is flat. Bowel sounds are normal.  Musculoskeletal:        General: Normal range of motion.     Cervical back: Normal range of motion.  Skin:    General: Skin is warm.  Neurological:     General: No focal deficit present.     Mental Status: [name] is alert.  Psychiatric:        Mood and Affect: Mood normal.       EKG:   Recent Labs: 10/11/2022: Magnesium  2.3 01/28/2023: TSH 1.040 05/15/2023: ALT 44; Hemoglobin 14.3; Platelets 151 06/14/2023: BUN 18; Creatinine, Ser 0.92; Potassium 4.3; Sodium 143    Lipid Panel    Component Value Date/Time   CHOL 165 05/07/2023 0851   CHOL 140 02/11/2023 1507   TRIG 369 (H) 05/07/2023 0851   TRIG 188 (H) 02/11/2023 1507   HDL 28 (L) 05/07/2023 0851   CHOLHDL 5.3 03/03/2019 0447   VLDL  44 (H) 03/03/2019 0447   VLDL 229 (H) 12/02/2018 1408   LDLCALC 78 05/07/2023 0851      Other studies Reviewed: Additional studies/ records that were reviewed today include:  Review of the above records demonstrates:       No data to display            ASSESSMENT AND PLAN:    ICD-10-CM   1. Other chest pain  R07.89    ccta in past normal coronaries a year ago, has aanxiety.    2. Atherosclerosis of  both carotid arteries  I65.23    stable    3. Hyperlipidemia associated with type 2 diabetes mellitus (HCC)  E11.69    E78.5     4. Hypertension associated with diabetes (HCC)  E11.59    I15.2     5. Syncope, unspecified syncope type  R55        Problem List Items Addressed This Visit       Cardiovascular and Mediastinum   Hypertension associated with diabetes (HCC)   Atherosclerosis of both carotid arteries     Endocrine   Hyperlipidemia associated with type 2 diabetes mellitus (HCC)   Other Visit Diagnoses       Other chest pain    -  Primary   ccta in past normal coronaries a year ago, has aanxiety.     Syncope, unspecified syncope type              Disposition:   Return in about 3 months (around 11/05/2023).    Total time spent: 30 minutes  Signed,  Debborah Fairly, MD  08/05/2023 1:27 PM    [facility]

## 2023-08-06 ENCOUNTER — Other Ambulatory Visit: Payer: Self-pay

## 2023-08-16 ENCOUNTER — Telehealth: Payer: Self-pay

## 2023-08-16 NOTE — Telephone Encounter (Signed)
 Copied from CRM [id number]. Topic: General - Other >> Aug 16, 2023 12:13 PM Sophia H wrote: Reason for CRM: Pt wife returning missed call from [place], please cb # [phone]

## 2023-08-22 ENCOUNTER — Other Ambulatory Visit: Payer: Self-pay | Admitting: Cardiovascular Disease

## 2023-08-22 DIAGNOSIS — R0789 Other chest pain: Secondary | ICD-10-CM

## 2023-08-22 DIAGNOSIS — E1159 Type 2 diabetes mellitus with other circulatory complications: Secondary | ICD-10-CM

## 2023-08-22 DIAGNOSIS — K219 Gastro-esophageal reflux disease without esophagitis: Secondary | ICD-10-CM

## 2023-08-22 DIAGNOSIS — E1165 Type 2 diabetes mellitus with hyperglycemia: Secondary | ICD-10-CM

## 2023-08-22 DIAGNOSIS — E1169 Type 2 diabetes mellitus with other specified complication: Secondary | ICD-10-CM

## 2023-08-22 DIAGNOSIS — I259 Chronic ischemic heart disease, unspecified: Secondary | ICD-10-CM

## 2023-08-22 DIAGNOSIS — I6523 Occlusion and stenosis of bilateral carotid arteries: Secondary | ICD-10-CM

## 2023-08-26 ENCOUNTER — Ambulatory Visit (INDEPENDENT_AMBULATORY_CARE_PROVIDER_SITE_OTHER)

## 2023-08-26 DIAGNOSIS — Z23 Encounter for immunization: Secondary | ICD-10-CM | POA: Diagnosis not present

## 2023-08-26 NOTE — Progress Notes (Signed)
 Dr Baldomero Bone pt finishing Hep B series  Injection 3 of 3 Hep B given Right deltoid by Eon Zunker Y.  Patient tolerated injection well.

## 2023-09-13 DIAGNOSIS — H16141 Punctate keratitis, right eye: Secondary | ICD-10-CM | POA: Diagnosis not present

## 2023-09-21 ENCOUNTER — Inpatient Hospital Stay
Admission: EM | Admit: 2023-09-21 | Discharge: 2023-09-24 | DRG: 603 | Disposition: A | Discharge status: Home / Self Care | Attending: Student | Admitting: Student

## 2023-09-21 ENCOUNTER — Emergency Department

## 2023-09-21 ENCOUNTER — Other Ambulatory Visit: Payer: Self-pay

## 2023-09-21 DIAGNOSIS — E785 Hyperlipidemia, unspecified: Secondary | ICD-10-CM | POA: Diagnosis present

## 2023-09-21 DIAGNOSIS — R7989 Other specified abnormal findings of blood chemistry: Secondary | ICD-10-CM | POA: Diagnosis present

## 2023-09-21 DIAGNOSIS — E66811 Obesity, class 1: Secondary | ICD-10-CM | POA: Diagnosis not present

## 2023-09-21 DIAGNOSIS — E860 Dehydration: Secondary | ICD-10-CM | POA: Diagnosis not present

## 2023-09-21 DIAGNOSIS — Z961 Presence of intraocular lens: Secondary | ICD-10-CM | POA: Diagnosis present

## 2023-09-21 DIAGNOSIS — E1159 Type 2 diabetes mellitus with other circulatory complications: Secondary | ICD-10-CM | POA: Diagnosis present

## 2023-09-21 DIAGNOSIS — I2583 Coronary atherosclerosis due to lipid rich plaque: Secondary | ICD-10-CM | POA: Diagnosis not present

## 2023-09-21 DIAGNOSIS — Z7902 Long term (current) use of antithrombotics/antiplatelets: Secondary | ICD-10-CM

## 2023-09-21 DIAGNOSIS — Z9842 Cataract extraction status, left eye: Secondary | ICD-10-CM

## 2023-09-21 DIAGNOSIS — R739 Hyperglycemia, unspecified: Secondary | ICD-10-CM | POA: Diagnosis not present

## 2023-09-21 DIAGNOSIS — R110 Nausea: Secondary | ICD-10-CM | POA: Diagnosis not present

## 2023-09-21 DIAGNOSIS — K589 Irritable bowel syndrome without diarrhea: Secondary | ICD-10-CM | POA: Diagnosis present

## 2023-09-21 DIAGNOSIS — E1129 Type 2 diabetes mellitus with other diabetic kidney complication: Secondary | ICD-10-CM

## 2023-09-21 DIAGNOSIS — Z794 Long term (current) use of insulin: Secondary | ICD-10-CM | POA: Diagnosis not present

## 2023-09-21 DIAGNOSIS — J4489 Other specified chronic obstructive pulmonary disease: Secondary | ICD-10-CM | POA: Diagnosis not present

## 2023-09-21 DIAGNOSIS — I69398 Other sequelae of cerebral infarction: Secondary | ICD-10-CM

## 2023-09-21 DIAGNOSIS — Z882 Allergy status to sulfonamides status: Secondary | ICD-10-CM

## 2023-09-21 DIAGNOSIS — E871 Hypo-osmolality and hyponatremia: Secondary | ICD-10-CM | POA: Diagnosis present

## 2023-09-21 DIAGNOSIS — E701 Other hyperphenylalaninemias: Secondary | ICD-10-CM | POA: Diagnosis present

## 2023-09-21 DIAGNOSIS — E1169 Type 2 diabetes mellitus with other specified complication: Secondary | ICD-10-CM | POA: Diagnosis present

## 2023-09-21 DIAGNOSIS — E1165 Type 2 diabetes mellitus with hyperglycemia: Secondary | ICD-10-CM | POA: Diagnosis present

## 2023-09-21 DIAGNOSIS — L03213 Periorbital cellulitis: Secondary | ICD-10-CM | POA: Diagnosis not present

## 2023-09-21 DIAGNOSIS — Z8249 Family history of ischemic heart disease and other diseases of the circulatory system: Secondary | ICD-10-CM | POA: Diagnosis not present

## 2023-09-21 DIAGNOSIS — R42 Dizziness and giddiness: Secondary | ICD-10-CM

## 2023-09-21 DIAGNOSIS — K8010 Calculus of gallbladder with chronic cholecystitis without obstruction: Secondary | ICD-10-CM | POA: Diagnosis present

## 2023-09-21 DIAGNOSIS — K760 Fatty (change of) liver, not elsewhere classified: Secondary | ICD-10-CM | POA: Diagnosis present

## 2023-09-21 DIAGNOSIS — I152 Hypertension secondary to endocrine disorders: Secondary | ICD-10-CM | POA: Diagnosis present

## 2023-09-21 DIAGNOSIS — R531 Weakness: Secondary | ICD-10-CM | POA: Diagnosis not present

## 2023-09-21 DIAGNOSIS — Z91030 Bee allergy status: Secondary | ICD-10-CM

## 2023-09-21 DIAGNOSIS — D696 Thrombocytopenia, unspecified: Secondary | ICD-10-CM | POA: Diagnosis not present

## 2023-09-21 DIAGNOSIS — Z7982 Long term (current) use of aspirin: Secondary | ICD-10-CM | POA: Diagnosis not present

## 2023-09-21 DIAGNOSIS — R220 Localized swelling, mass and lump, head: Secondary | ICD-10-CM | POA: Diagnosis not present

## 2023-09-21 DIAGNOSIS — I25118 Atherosclerotic heart disease of native coronary artery with other forms of angina pectoris: Secondary | ICD-10-CM | POA: Diagnosis present

## 2023-09-21 DIAGNOSIS — Z7984 Long term (current) use of oral hypoglycemic drugs: Secondary | ICD-10-CM | POA: Diagnosis not present

## 2023-09-21 DIAGNOSIS — Z9841 Cataract extraction status, right eye: Secondary | ICD-10-CM

## 2023-09-21 DIAGNOSIS — Z885 Allergy status to narcotic agent status: Secondary | ICD-10-CM

## 2023-09-21 DIAGNOSIS — R011 Cardiac murmur, unspecified: Secondary | ICD-10-CM | POA: Diagnosis present

## 2023-09-21 DIAGNOSIS — Z6832 Body mass index (BMI) 32.0-32.9, adult: Secondary | ICD-10-CM

## 2023-09-21 DIAGNOSIS — Z8619 Personal history of other infectious and parasitic diseases: Secondary | ICD-10-CM

## 2023-09-21 DIAGNOSIS — H5461 Unqualified visual loss, right eye, normal vision left eye: Secondary | ICD-10-CM | POA: Diagnosis present

## 2023-09-21 DIAGNOSIS — R509 Fever, unspecified: Secondary | ICD-10-CM | POA: Diagnosis not present

## 2023-09-21 DIAGNOSIS — Z833 Family history of diabetes mellitus: Secondary | ICD-10-CM | POA: Diagnosis not present

## 2023-09-21 DIAGNOSIS — K219 Gastro-esophageal reflux disease without esophagitis: Secondary | ICD-10-CM | POA: Diagnosis not present

## 2023-09-21 DIAGNOSIS — Z9109 Other allergy status, other than to drugs and biological substances: Secondary | ICD-10-CM

## 2023-09-21 DIAGNOSIS — Z7951 Long term (current) use of inhaled steroids: Secondary | ICD-10-CM

## 2023-09-21 DIAGNOSIS — Z8673 Personal history of transient ischemic attack (TIA), and cerebral infarction without residual deficits: Secondary | ICD-10-CM

## 2023-09-21 DIAGNOSIS — I2510 Atherosclerotic heart disease of native coronary artery without angina pectoris: Secondary | ICD-10-CM

## 2023-09-21 DIAGNOSIS — Z79899 Other long term (current) drug therapy: Secondary | ICD-10-CM

## 2023-09-21 DIAGNOSIS — H5711 Ocular pain, right eye: Secondary | ICD-10-CM | POA: Diagnosis present

## 2023-09-21 LAB — COMPREHENSIVE METABOLIC PANEL WITH GFR
ALT: 37 U/L (ref 0–44)
AST: 24 U/L (ref 15–41)
Albumin: 4.2 g/dL (ref 3.5–5.0)
Alkaline Phosphatase: 121 U/L (ref 38–126)
Anion gap: 10 (ref 5–15)
BUN: 22 mg/dL — ABNORMAL HIGH (ref 6–20)
CO2: 21 mmol/L — ABNORMAL LOW (ref 22–32)
Calcium: 9 mg/dL (ref 8.9–10.3)
Chloride: 101 mmol/L (ref 98–111)
Creatinine, Ser: 1.28 mg/dL — ABNORMAL HIGH (ref 0.61–1.24)
GFR, Estimated: >60 mL/min (ref >60)
Glucose, Bld: 295 mg/dL — ABNORMAL HIGH (ref 70–99)
Potassium: 3.6 mmol/L (ref 3.5–5.1)
Sodium: 132 mmol/L — ABNORMAL LOW (ref 135–145)
Total Bilirubin: 1 mg/dL (ref 0.0–1.2)
Total Protein: 7.5 g/dL (ref 6.5–8.1)

## 2023-09-21 LAB — CBC WITH DIFFERENTIAL/PLATELET
Abs Immature Granulocytes: 0.07 K/uL (ref 0.00–0.07)
Basophils Absolute: 0 K/uL (ref 0.0–0.1)
Basophils Relative: 0 %
Eosinophils Absolute: 0 K/uL (ref 0.0–0.5)
Eosinophils Relative: 0 %
HCT: 44.9 % (ref 39.0–52.0)
Hemoglobin: 15.3 g/dL (ref 13.0–17.0)
Immature Granulocytes: 1 %
Lymphocytes Relative: 9 %
Lymphs Abs: 0.6 K/uL — ABNORMAL LOW (ref 0.7–4.0)
MCH: 30.9 pg (ref 26.0–34.0)
MCHC: 34.1 g/dL (ref 30.0–36.0)
MCV: 90.7 fL (ref 80.0–100.0)
Monocytes Absolute: 0.6 K/uL (ref 0.1–1.0)
Monocytes Relative: 8 %
Neutro Abs: 5.6 K/uL (ref 1.7–7.7)
Neutrophils Relative %: 82 %
Platelets: 154 K/uL (ref 150–400)
RBC: 4.95 MIL/uL (ref 4.22–5.81)
RDW: 13 % (ref 11.5–15.5)
WBC: 6.8 K/uL (ref 4.0–10.5)
nRBC: 0 % (ref 0.0–0.2)

## 2023-09-21 LAB — TROPONIN I (HIGH SENSITIVITY): Troponin I (High Sensitivity): 16 ng/L (ref <18)

## 2023-09-21 LAB — LACTIC ACID, PLASMA: Lactic Acid, Venous: 1.5 mmol/L (ref 0.5–1.9)

## 2023-09-21 LAB — LIPASE, BLOOD: Lipase: 47 U/L (ref 11–51)

## 2023-09-21 MED ORDER — VANCOMYCIN HCL IN DEXTROSE 1-5 GM/200ML-% IV SOLN
1000.0000 mg | Freq: Once | INTRAVENOUS | Status: AC
  Administered 2023-09-21: 1000 mg via INTRAVENOUS
  Filled 2023-09-21: qty 200

## 2023-09-21 MED ORDER — LACTATED RINGERS IV BOLUS
1000.0000 mL | Freq: Once | INTRAVENOUS | Status: AC
  Administered 2023-09-21: 1000 mL via INTRAVENOUS

## 2023-09-21 MED ORDER — IOHEXOL 300 MG/ML  SOLN
75.0000 mL | Freq: Once | INTRAMUSCULAR | Status: AC | PRN
Start: 2023-09-21 — End: 2023-09-21
  Administered 2023-09-21: 75 mL via INTRAVENOUS

## 2023-09-21 MED ORDER — PIPERACILLIN-TAZOBACTAM 3.375 G IVPB 30 MIN
3.3750 g | Freq: Once | INTRAVENOUS | Status: AC
  Administered 2023-09-22: 3.375 g via INTRAVENOUS
  Filled 2023-09-21 (×2): qty 50

## 2023-09-21 NOTE — ED Triage Notes (Signed)
 Pt to ED via [facility] c/o generalized weakness. Infection in right eye taking ABTs unsure of how long. Pt c/o nausea no vomiting. 93% on RA. Glucose 256 mg/dl. Initial sys in 90s.

## 2023-09-21 NOTE — ED Provider Notes (Signed)
  Physical Exam  BP (!) 106/59   Pulse 86   Temp 99.1 F (37.3 C)   Resp 20   SpO2 95%   Physical Exam  Procedures  Procedures  ED Course / MDM   Clinical Course as of 09/22/23 0507  Sat Sep 21, 2023  2309 S/o: 72M p/w R periorbital pain, swelling despite recent abx for periorbital cellulitis  Ocular exam unremarkable  Treating w/ abx, anticipate admission after CT [MM]  2354 CT orbits: IMPRESSION: Soft tissue swelling involving the right preseptal periorbital soft tissues, concerning for acute preseptal periorbital cellulitis given provided history. No postseptal or intraorbital extension evident by CT at this time. No discrete abscess or drainable fluid collection.   [MM]  Sun Sep 22, 2023  0027 Patient reevaluated, amenable to admission  Hospitalist consult order placed [MM]    Clinical Course User Index [MM] Clarine Ozell LABOR, MD   Medical Decision Making Amount and/or Complexity of Data Reviewed Labs: ordered. Radiology: ordered.  Risk Prescription drug management. Decision regarding hospitalization.          Clarine Ozell LABOR, MD 09/22/23 [phone]

## 2023-09-21 NOTE — ED Provider Notes (Signed)
 [facility] Provider Note    Event Date/Time   First MD Initiated Contact with Patient 09/21/23 2111     (approximate)   History   Weakness   HPI  Alex Nott. is a 63 y.o. male with history of hypertension, hyperlipidemia, type 2 diabetes, and asthma who presents with generalized weakness and falls right eye pain.  The patient has been treated for infection around the right eye.  He states that he saw his eye doctor and was started on antibiotic which she ran out of several days ago.  He was also using drops.  He is not sure what they were.  In addition he reports feeling increased and weak over the last several days.  He reports nausea but no vomiting.  He has not had any diarrhea.  Denies any chest pain or difficulty breathing.  He states he feels very lightheaded when he stands up, and feels like he might pass out.  This is developed over the last few days.  He states he is able to see out of the right eye although it is difficult to open it due to the swelling and pain.  I reviewed the past medical records.  The patient's most recent outpatient encounter was with Dr. Fernand from cardiology on 5/19 for follow-up of his chronic conditions.  He reported occasional chest pain and shortness of breath at that time and was being medically managed.  He has no recent hospitalizations.   Physical Exam   Triage Vital Signs: ED Triage Vitals [09/21/23 2118]  Encounter Vitals Group     BP (!) 106/59     Girls Systolic BP Percentile      Girls Diastolic BP Percentile      Boys Systolic BP Percentile      Boys Diastolic BP Percentile      Pulse Rate 86     Resp 20     Temp 99.1 F (37.3 C)     Temp src      SpO2 95 %     Weight      Height      Head Circumference      Peak Flow      Pain Score      Pain Loc      Pain Education      Exclude from Growth Chart     Most recent vital signs: Vitals:   09/21/23 2118  BP: (!) 106/59  Pulse: 86  Resp:  20  Temp: 99.1 F (37.3 C)  SpO2: 95%     General: Alert, uncomfortable appearing but in no distress.  CV:  Good peripheral perfusion.  Resp:  Normal effort.  Lungs CTAB. Abd:  Soft and nontender.  No distention.  Other:  Very dry mucous membranes.  EOMI.  PERRLA.  Right periorbital swelling and erythema, however the eye itself does not demonstrate any conjunctival injection or swelling.   ED Results / Procedures / Treatments   Labs (all labs ordered are listed, but only abnormal results are displayed) Labs Reviewed  COMPREHENSIVE METABOLIC PANEL WITH GFR - Abnormal; Notable for the following components:      Result Value   Sodium 132 (*)    CO2 21 (*)    Glucose, Bld 295 (*)    BUN 22 (*)    Creatinine, Ser 1.28 (*)    All other components within normal limits  CBC WITH DIFFERENTIAL/PLATELET - Abnormal; Notable for the following components:  Lymphs Abs 0.6 (*)    All other components within normal limits  LACTIC ACID, PLASMA  LIPASE, BLOOD  LACTIC ACID, PLASMA  URINALYSIS, ROUTINE W REFLEX MICROSCOPIC  TROPONIN I (HIGH SENSITIVITY)  TROPONIN I (HIGH SENSITIVITY)     EKG      RADIOLOGY  Chest x-ray: I independently viewed and interpreted the images; there is no focal consolidation or edema  CT orbits: Pending  PROCEDURES:  Critical Care performed: No  Procedures   MEDICATIONS ORDERED IN ED: Medications  vancomycin  (VANCOCIN ) IVPB 1000 mg/200 mL premix (1,000 mg Intravenous New Bag/Given 09/21/23 2323)  piperacillin -tazobactam (ZOSYN ) IVPB 3.375 g (has no administration in time range)  lactated ringers  bolus 1,000 mL (0 mLs Intravenous Stopped 09/21/23 2323)  iohexol  (OMNIPAQUE ) 300 MG/ML solution 75 mL (75 mLs Intravenous Contrast Given 09/21/23 2250)     IMPRESSION / MDM / ASSESSMENT AND PLAN / ED COURSE  I reviewed the triage vital signs and the nursing notes.  63 year old male with PMH as noted above presents with generalized weakness that is  worsened over the last few days associated with lightheadedness as well as with pain, redness, swelling around the right eye which he has been treated for with drops and oral pills.  Differential diagnosis includes, but is not limited to, periorbital cellulitis, orbital cellulitis, other facial cellulitis, UTI or other source of infection, dehydration, electrolyte abnormality, AKI.  We will obtain CT orbits, chest x-ray, lab workup, give a fluid bolus, and reassess.  Patient's presentation is most consistent with acute presentation with potential threat to life or bodily function.  The patient is on the cardiac monitor to evaluate for evidence of arrhythmia and/or significant heart rate changes.  ----------------------------------------- 11:22 PM on 09/21/2023 -----------------------------------------  Lab workup so far is reassuring.  CMP shows no concerning acute findings.  CBC shows no leukocytosis.  Lactate is negative.  CT is pending.  I have ordered IV vancomycin  and Zosyn  for treatment of periorbital cellulitis.  I have signed the patient out to the oncoming ED physician Dr. Clarine.  FINAL CLINICAL IMPRESSION(S) / ED DIAGNOSES   Final diagnoses:  Periorbital cellulitis of right eye     Rx / DC Orders   ED Discharge Orders     None        Note:  This document was prepared using Dragon voice recognition software and may include unintentional dictation errors.    Jacolyn Pae, MD 09/21/23 2324

## 2023-09-21 NOTE — Patient Instructions (Incomplete)

## 2023-09-22 ENCOUNTER — Encounter: Payer: Self-pay | Admitting: Family Medicine

## 2023-09-22 DIAGNOSIS — E701 Other hyperphenylalaninemias: Secondary | ICD-10-CM

## 2023-09-22 DIAGNOSIS — I2510 Atherosclerotic heart disease of native coronary artery without angina pectoris: Secondary | ICD-10-CM

## 2023-09-22 DIAGNOSIS — E1159 Type 2 diabetes mellitus with other circulatory complications: Secondary | ICD-10-CM

## 2023-09-22 DIAGNOSIS — K219 Gastro-esophageal reflux disease without esophagitis: Secondary | ICD-10-CM | POA: Diagnosis present

## 2023-09-22 DIAGNOSIS — E860 Dehydration: Secondary | ICD-10-CM | POA: Diagnosis present

## 2023-09-22 DIAGNOSIS — I2583 Coronary atherosclerosis due to lipid rich plaque: Secondary | ICD-10-CM

## 2023-09-22 DIAGNOSIS — E66811 Obesity, class 1: Secondary | ICD-10-CM | POA: Diagnosis present

## 2023-09-22 DIAGNOSIS — K760 Fatty (change of) liver, not elsewhere classified: Secondary | ICD-10-CM | POA: Diagnosis present

## 2023-09-22 DIAGNOSIS — I69398 Other sequelae of cerebral infarction: Secondary | ICD-10-CM | POA: Diagnosis not present

## 2023-09-22 DIAGNOSIS — R42 Dizziness and giddiness: Secondary | ICD-10-CM

## 2023-09-22 DIAGNOSIS — Z7982 Long term (current) use of aspirin: Secondary | ICD-10-CM | POA: Diagnosis not present

## 2023-09-22 DIAGNOSIS — Z8673 Personal history of transient ischemic attack (TIA), and cerebral infarction without residual deficits: Secondary | ICD-10-CM | POA: Diagnosis not present

## 2023-09-22 DIAGNOSIS — Z794 Long term (current) use of insulin: Secondary | ICD-10-CM | POA: Diagnosis not present

## 2023-09-22 DIAGNOSIS — E871 Hypo-osmolality and hyponatremia: Secondary | ICD-10-CM

## 2023-09-22 DIAGNOSIS — E1165 Type 2 diabetes mellitus with hyperglycemia: Secondary | ICD-10-CM

## 2023-09-22 DIAGNOSIS — Z833 Family history of diabetes mellitus: Secondary | ICD-10-CM | POA: Diagnosis not present

## 2023-09-22 DIAGNOSIS — R531 Weakness: Secondary | ICD-10-CM | POA: Diagnosis present

## 2023-09-22 DIAGNOSIS — L03213 Periorbital cellulitis: Secondary | ICD-10-CM | POA: Diagnosis present

## 2023-09-22 DIAGNOSIS — Z7902 Long term (current) use of antithrombotics/antiplatelets: Secondary | ICD-10-CM | POA: Diagnosis not present

## 2023-09-22 DIAGNOSIS — Z8249 Family history of ischemic heart disease and other diseases of the circulatory system: Secondary | ICD-10-CM | POA: Diagnosis not present

## 2023-09-22 DIAGNOSIS — K8010 Calculus of gallbladder with chronic cholecystitis without obstruction: Secondary | ICD-10-CM | POA: Diagnosis present

## 2023-09-22 DIAGNOSIS — Z6832 Body mass index (BMI) 32.0-32.9, adult: Secondary | ICD-10-CM | POA: Diagnosis not present

## 2023-09-22 DIAGNOSIS — J4489 Other specified chronic obstructive pulmonary disease: Secondary | ICD-10-CM | POA: Diagnosis present

## 2023-09-22 DIAGNOSIS — I25118 Atherosclerotic heart disease of native coronary artery with other forms of angina pectoris: Secondary | ICD-10-CM | POA: Diagnosis present

## 2023-09-22 DIAGNOSIS — E785 Hyperlipidemia, unspecified: Secondary | ICD-10-CM | POA: Diagnosis present

## 2023-09-22 DIAGNOSIS — E1169 Type 2 diabetes mellitus with other specified complication: Secondary | ICD-10-CM

## 2023-09-22 DIAGNOSIS — I152 Hypertension secondary to endocrine disorders: Secondary | ICD-10-CM

## 2023-09-22 DIAGNOSIS — D696 Thrombocytopenia, unspecified: Secondary | ICD-10-CM | POA: Diagnosis present

## 2023-09-22 DIAGNOSIS — Z7984 Long term (current) use of oral hypoglycemic drugs: Secondary | ICD-10-CM | POA: Diagnosis not present

## 2023-09-22 LAB — URINALYSIS, ROUTINE W REFLEX MICROSCOPIC
Bacteria, UA: NONE SEEN
Bilirubin Urine: NEGATIVE
Glucose, UA: >=500 mg/dL — AB
Hgb urine dipstick: NEGATIVE
Ketones, ur: 5 mg/dL — AB
Leukocytes,Ua: NEGATIVE
Nitrite: NEGATIVE
Protein, ur: NEGATIVE mg/dL
Specific Gravity, Urine: 1.031 — ABNORMAL HIGH (ref 1.005–1.030)
pH: 5 (ref 5.0–8.0)

## 2023-09-22 LAB — CBC
HCT: 43.9 % (ref 39.0–52.0)
Hemoglobin: 14.8 g/dL (ref 13.0–17.0)
MCH: 31 pg (ref 26.0–34.0)
MCHC: 33.7 g/dL (ref 30.0–36.0)
MCV: 91.8 fL (ref 80.0–100.0)
Platelets: 133 K/uL — ABNORMAL LOW (ref 150–400)
RBC: 4.78 MIL/uL (ref 4.22–5.81)
RDW: 13.1 % (ref 11.5–15.5)
WBC: 5.8 K/uL (ref 4.0–10.5)
nRBC: 0 % (ref 0.0–0.2)

## 2023-09-22 LAB — BASIC METABOLIC PANEL WITH GFR
Anion gap: 10 (ref 5–15)
BUN: 18 mg/dL (ref 6–20)
CO2: 20 mmol/L — ABNORMAL LOW (ref 22–32)
Calcium: 8.5 mg/dL — ABNORMAL LOW (ref 8.9–10.3)
Chloride: 103 mmol/L (ref 98–111)
Creatinine, Ser: 1.19 mg/dL (ref 0.61–1.24)
GFR, Estimated: >60 mL/min (ref >60)
Glucose, Bld: 316 mg/dL — ABNORMAL HIGH (ref 70–99)
Potassium: 3.7 mmol/L (ref 3.5–5.1)
Sodium: 133 mmol/L — ABNORMAL LOW (ref 135–145)

## 2023-09-22 LAB — GLUCOSE, CAPILLARY: Glucose-Capillary: 181 mg/dL — ABNORMAL HIGH (ref 70–99)

## 2023-09-22 LAB — HEMOGLOBIN A1C
Hgb A1c MFr Bld: 11.5 % — ABNORMAL HIGH (ref 4.8–5.6)
Mean Plasma Glucose: 283.35 mg/dL

## 2023-09-22 LAB — LACTIC ACID, PLASMA: Lactic Acid, Venous: 1.3 mmol/L (ref 0.5–1.9)

## 2023-09-22 LAB — CBG MONITORING, ED
Glucose-Capillary: 342 mg/dL — ABNORMAL HIGH (ref 70–99)
Glucose-Capillary: 157 mg/dL — ABNORMAL HIGH (ref 70–99)

## 2023-09-22 LAB — TROPONIN I (HIGH SENSITIVITY): Troponin I (High Sensitivity): 13 ng/L (ref <18)

## 2023-09-22 MED ORDER — GABAPENTIN 300 MG PO CAPS
300.0000 mg | ORAL_CAPSULE | Freq: Two times a day (BID) | ORAL | Status: DC
  Administered 2023-09-22 – 2023-09-24 (×5): 300 mg via ORAL
  Filled 2023-09-22 (×3): qty 1
  Filled 2023-09-22: qty 3
  Filled 2023-09-22 (×2): qty 1

## 2023-09-22 MED ORDER — ISOSORBIDE MONONITRATE ER 30 MG PO TB24
30.0000 mg | ORAL_TABLET | Freq: Every day | ORAL | Status: DC
  Administered 2023-09-22 – 2023-09-24 (×3): 30 mg via ORAL
  Filled 2023-09-22 (×3): qty 1

## 2023-09-22 MED ORDER — ONDANSETRON HCL 4 MG/2ML IJ SOLN
4.0000 mg | Freq: Four times a day (QID) | INTRAMUSCULAR | Status: DC | PRN
  Administered 2023-09-23: 4 mg via INTRAVENOUS
  Filled 2023-09-22: qty 2

## 2023-09-22 MED ORDER — FENOFIBRATE 54 MG PO TABS
54.0000 mg | ORAL_TABLET | Freq: Every day | ORAL | Status: DC
  Administered 2023-09-22 – 2023-09-24 (×3): 54 mg via ORAL
  Filled 2023-09-22 (×3): qty 1

## 2023-09-22 MED ORDER — TRAMADOL HCL 50 MG PO TABS
50.0000 mg | ORAL_TABLET | Freq: Three times a day (TID) | ORAL | Status: DC | PRN
  Administered 2023-09-22: 50 mg via ORAL
  Filled 2023-09-22: qty 1

## 2023-09-22 MED ORDER — ONDANSETRON HCL 4 MG PO TABS: 4.0000 mg | ORAL_TABLET | Freq: Four times a day (QID) | ORAL | Status: DC | PRN

## 2023-09-22 MED ORDER — ENOXAPARIN SODIUM 40 MG/0.4ML IJ SOSY
40.0000 mg | PREFILLED_SYRINGE | INTRAMUSCULAR | Status: DC
  Administered 2023-09-22 – 2023-09-23 (×2): 40 mg via SUBCUTANEOUS
  Filled 2023-09-22 (×2): qty 0.4

## 2023-09-22 MED ORDER — CLOPIDOGREL BISULFATE 75 MG PO TABS
75.0000 mg | ORAL_TABLET | Freq: Every day | ORAL | Status: DC
  Administered 2023-09-22 – 2023-09-24 (×3): 75 mg via ORAL
  Filled 2023-09-22 (×3): qty 1

## 2023-09-22 MED ORDER — ROSUVASTATIN CALCIUM 10 MG PO TABS
20.0000 mg | ORAL_TABLET | Freq: Every day | ORAL | Status: DC
  Administered 2023-09-22 – 2023-09-24 (×3): 20 mg via ORAL
  Filled 2023-09-22 (×2): qty 2
  Filled 2023-09-22: qty 1

## 2023-09-22 MED ORDER — MONTELUKAST SODIUM 10 MG PO TABS
10.0000 mg | ORAL_TABLET | Freq: Every day | ORAL | Status: DC
  Administered 2023-09-22 – 2023-09-23 (×2): 10 mg via ORAL
  Filled 2023-09-22 (×2): qty 1

## 2023-09-22 MED ORDER — INSULIN GLARGINE-YFGN 100 UNIT/ML ~~LOC~~ SOLN
40.0000 [IU] | Freq: Every day | SUBCUTANEOUS | Status: DC
  Administered 2023-09-23 – 2023-09-24 (×2): 40 [IU] via SUBCUTANEOUS
  Filled 2023-09-22 (×2): qty 0.4

## 2023-09-22 MED ORDER — LISINOPRIL 20 MG PO TABS
20.0000 mg | ORAL_TABLET | Freq: Every day | ORAL | Status: DC
  Administered 2023-09-22 – 2023-09-24 (×3): 20 mg via ORAL
  Filled 2023-09-22: qty 1
  Filled 2023-09-22: qty 2
  Filled 2023-09-22: qty 1

## 2023-09-22 MED ORDER — SODIUM CHLORIDE 0.9 % IV SOLN: INTRAVENOUS | Status: AC

## 2023-09-22 MED ORDER — CEFAZOLIN SODIUM-DEXTROSE 2-4 GM/100ML-% IV SOLN
2.0000 g | Freq: Three times a day (TID) | INTRAVENOUS | Status: DC
  Administered 2023-09-22 – 2023-09-24 (×7): 2 g via INTRAVENOUS
  Filled 2023-09-22 (×9): qty 100

## 2023-09-22 MED ORDER — ASPIRIN 81 MG PO TBEC
81.0000 mg | DELAYED_RELEASE_TABLET | Freq: Every day | ORAL | Status: DC
  Administered 2023-09-22 – 2023-09-24 (×3): 81 mg via ORAL
  Filled 2023-09-22 (×3): qty 1

## 2023-09-22 MED ORDER — INSULIN GLARGINE-YFGN 100 UNIT/ML ~~LOC~~ SOLN
40.0000 [IU] | Freq: Two times a day (BID) | SUBCUTANEOUS | Status: DC
  Administered 2023-09-22: 40 [IU] via SUBCUTANEOUS
  Filled 2023-09-22 (×2): qty 0.4

## 2023-09-22 MED ORDER — ACETAMINOPHEN 325 MG PO TABS
650.0000 mg | ORAL_TABLET | Freq: Four times a day (QID) | ORAL | Status: DC | PRN
  Administered 2023-09-23 (×2): 650 mg via ORAL
  Filled 2023-09-22 (×2): qty 2

## 2023-09-22 MED ORDER — FLUTICASONE FUROATE-VILANTEROL 100-25 MCG/ACT IN AEPB
1.0000 | INHALATION_SPRAY | Freq: Every day | RESPIRATORY_TRACT | Status: DC
  Administered 2023-09-22 – 2023-09-24 (×3): 1 via RESPIRATORY_TRACT
  Filled 2023-09-22 (×2): qty 28

## 2023-09-22 MED ORDER — LACTATED RINGERS IV BOLUS
1000.0000 mL | Freq: Once | INTRAVENOUS | Status: AC
  Administered 2023-09-22: 1000 mL via INTRAVENOUS

## 2023-09-22 MED ORDER — PANTOPRAZOLE SODIUM 40 MG PO TBEC
40.0000 mg | DELAYED_RELEASE_TABLET | Freq: Every day | ORAL | Status: DC
  Administered 2023-09-22 – 2023-09-24 (×3): 40 mg via ORAL
  Filled 2023-09-22 (×3): qty 1

## 2023-09-22 MED ORDER — RESMETIROM 60 MG PO TABS: 1.0000 | ORAL_TABLET | Freq: Every day | ORAL | Status: DC

## 2023-09-22 MED ORDER — INSULIN ASPART 100 UNIT/ML IJ SOLN
0.0000 [IU] | Freq: Every day | INTRAMUSCULAR | Status: DC
  Administered 2023-09-23: 3 [IU] via SUBCUTANEOUS
  Filled 2023-09-22: qty 1

## 2023-09-22 MED ORDER — INSULIN ASPART 100 UNIT/ML IJ SOLN
0.0000 [IU] | Freq: Three times a day (TID) | INTRAMUSCULAR | Status: DC
  Administered 2023-09-22: 11 [IU] via SUBCUTANEOUS
  Administered 2023-09-22 – 2023-09-24 (×5): 3 [IU] via SUBCUTANEOUS
  Filled 2023-09-22 (×6): qty 1

## 2023-09-22 NOTE — ED Notes (Signed)
Pt ambulatory to restroom with walker 

## 2023-09-22 NOTE — ED Notes (Signed)
 PT eating breakfast tray at this time.NAD noted. No needs expressed at this time.

## 2023-09-22 NOTE — ED Notes (Signed)
 PT assisted from bathroom back to stretcher. PT given cola as requested. Pt repositioned in bed at this time. NAD noted.

## 2023-09-22 NOTE — Hospital Course (Addendum)
 63 y.o. 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  Went to see his eye doctor recently for a routine appointment, noted redness and pain in the right eyelid. Prescribed oral antibiotics by his eye doctor.  He started these, but they did not seem to help and his nausea, poor appetite, then dizziness started.  Finally he was very weak and couldn't stand, so he came to the ER.     In the ER, CBC and electroyltes normal.  CT showed findings consistent with preseptal cellulitis.

## 2023-09-22 NOTE — H&P (Signed)
 History and Physical    Patient: [name]. FMW:[id number] DOB: [birth year] DOA: 09/21/2023 DOS: the patient was seen and examined on 09/22/2023 PCP: Valerio Melanie DASEN, NP  Patient coming from: Home  Chief Complaint:  Chief Complaint  Patient presents with   Weakness       HPI:  63 y.o. 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  Went to see his eye doctor recently for a routine appointment, noted redness and pain in the right eyelid. Prescribed oral antibiotics by his eye doctor.  He started these, but they did not seem to help and his nausea, poor appetite, then dizziness started.  Finally he was very weak and couldn't stand, so he came to the ER.     In the ER, CBC and electroyltes normal.  CT showed findings consistent with preseptal cellulitis.      Review of Systems  Constitutional:  Positive for malaise/fatigue. Negative for chills and fever.  Eyes:  Positive for pain. Negative for blurred vision, double vision, discharge and redness.  Gastrointestinal:  Positive for nausea.  Neurological:  Positive for dizziness and weakness. Negative for focal weakness, seizures and loss of consciousness.  All other systems reviewed and are negative.    Past Medical History:  Diagnosis Date   Arrhythmia    Asthma    CCC (chronic calculous cholecystitis)    COPD (chronic obstructive pulmonary disease) (HCC)    Cyst of kidney, acquired    Diabetes mellitus without complication (HCC) 2013   type 2   Diverticulosis    Edentulous    Fatty liver    Gallbladder polyp    GERD (gastroesophageal reflux disease)    Heart murmur    History of chicken pox    History of measles as a child    History of PKU    Hyperlipidemia    Hypertension    IBS (irritable bowel syndrome)    Irregular heart beat    Mentally challenged    Pancreatitis    PONV (postoperative nausea and  vomiting)    Stroke (HCC) 03/02/2019   vision issues - right eye/right sided weakness   Vision impairment    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   Past Surgical History:  Procedure Laterality Date   Cardiac Catherization     [facility]   CARDIAC CATHETERIZATION     [facility]   CATARACT EXTRACTION W/PHACO Left 06/05/2021   Procedure: CATARACT EXTRACTION PHACO AND INTRAOCULAR LENS PLACEMENT (IOC) LEFT DIABETIC 3.75 00:28.0;  Surgeon: Myrna Adine Anes, MD;  Location: [facility];  Service: Ophthalmology;  Laterality: Left;  Diabetic   CATARACT EXTRACTION W/PHACO Right 07/10/2021   Procedure: CATARACT EXTRACTION PHACO AND INTRAOCULAR LENS PLACEMENT (IOC) RIGHT DIABETIC;  Surgeon: Myrna Adine Anes, MD;  Location: [facility];  Service: Ophthalmology;  Laterality: Right;  Diabetic 2.66 00:24.4   COLONOSCOPY     COLONOSCOPY WITH PROPOFOL  N/A 08/10/2020   Procedure: COLONOSCOPY WITH PROPOFOL ;  Surgeon: Unk Corinn Skiff, MD;  Location: [facility] ENDOSCOPY;  Service: Gastroenterology;  Laterality: N/A;  Has ankle monitor; (Parole officer Ms Kenton needs a time before Monday [phone] - per patient's wife)   ESOPHAGOGASTRODUODENOSCOPY (EGD) WITH PROPOFOL  N/A 12/27/2016   Procedure: ESOPHAGOGASTRODUODENOSCOPY (EGD) WITH PROPOFOL ;  Surgeon: Unk Corinn Skiff, MD;  Location: [facility] ENDOSCOPY;  Service: Gastroenterology;  Laterality: N/A;   HEMORRHOID SURGERY     KNEE SURGERY  cyst removed   Ligament Removal Left    of left thumb: dr. Neill   ligament removal  of left thumb     Dr. Neill   NM GATED MYOCARDIAL STUDY (ARMX HX)  06/23/2014   Paraschos. Normal   WISDOM TOOTH EXTRACTION     Social History:  reports that he has never smoked. He has never been exposed to tobacco smoke. He has never used smokeless tobacco. He reports that he does not drink alcohol and does not use drugs.  Allergies  Allergen Reactions   Sulfa Antibiotics Shortness Of Breath and Nausea And Vomiting    Bee Venom Hives and Swelling    All kinds of bees   Morphine Nausea And Vomiting   Other     Certain powders (detergent)    Family History  Problem Relation Age of Onset   Cancer Mother        throat   Diabetes Father    Heart disease Father    Cancer Maternal Grandmother    Cancer Maternal Grandfather        pancreatic   Cancer Paternal Grandfather     Prior to Admission medications   Medication Sig Start Date End Date Taking? Authorizing Provider  clopidogrel  (PLAVIX ) 75 MG tablet Take 1 tablet (75 mg total) by mouth daily. 04/30/23  Yes Cannady, Jolene T, NP  empagliflozin  (JARDIANCE ) 25 MG TABS tablet Take 1 tablet (25 mg total) by mouth daily. 04/30/23  Yes Cannady, Jolene T, NP  EPINEPHrine  0.3 mg/0.3 mL IJ SOAJ injection INJECT 1 SYRINGE INTO OUTER THIGH ONCE AS NEEDED FOR SEVERE ALLERGIC REACTION. Strength 0.3 MG/0.3ML 11/08/22  Yes Cannady, Jolene T, NP  erythromycin ophthalmic ointment SMARTSIG:In Eye(s) 09/13/23  Yes [provider]  fenofibrate  54 MG tablet Take 1 tablet (54 mg total) by mouth daily. 04/30/23  Yes Cannady, Jolene T, NP  fluticasone  furoate-vilanterol (BREO ELLIPTA ) 100-25 MCG/ACT AEPB Inhale 1 puff into the lungs daily. 04/30/23  Yes Cannady, Jolene T, NP  gabapentin  (NEURONTIN ) 300 MG capsule Take 1 capsule (300 mg total) by mouth 2 (two) times daily. 04/30/23  Yes Cannady, Jolene T, NP  gatifloxacin (ZYMAXID) 0.5 % SOLN SMARTSIG:In Eye(s) 09/13/23  Yes [provider]  lisinopril  (ZESTRIL ) 20 MG tablet Take 1 tablet (20 mg total) by mouth daily. 04/30/23  Yes Cannady, Jolene T, NP  montelukast  (SINGULAIR ) 10 MG tablet Take 1 tablet (10 mg total) by mouth at bedtime. 04/30/23  Yes Cannady, Jolene T, NP  mupirocin  ointment (BACTROBAN ) 2 % Apply 1 Application topically 2 (two) times daily. 04/30/23  Yes Cannady, Jolene T, NP  pantoprazole  (PROTONIX ) 40 MG tablet TAKE 1 TABLET BY MOUTH ONCE DAILY 08/26/23  Yes Fernand Alter A, MD  Resmetirom   (REZDIFFRA ) 60 MG TABS Take 1 tablet by mouth daily. 04/24/23  Yes Vanga, Corinn Skiff, MD  rosuvastatin  (CRESTOR ) 20 MG tablet Take 1 tablet (20 mg total) by mouth daily. 04/03/23  Yes Cannady, Jolene T, NP  sitaGLIPtin  (JANUVIA ) 100 MG tablet Take 1 tablet (100 mg total) by mouth daily. 04/30/23  Yes Cannady, Melanie T, NP  Accu-Chek Softclix Lancets lancets 1 each by Other route 5 (five) times daily. Use as instructed 04/05/23   Cannady, Jolene T, NP  acetaminophen  (TYLENOL ) 500 MG tablet Take 500 mg by mouth every 6 (six) hours as needed.    [provider]  albuterol  (VENTOLIN  HFA) 108 (90 Base) MCG/ACT inhaler Inhale 2 puffs into the lungs every 6 (six) hours as needed.  06/25/22   Cannady, Jolene T, NP  aspirin  81 MG EC tablet Take 1 tablet (81 mg total) by mouth daily. 03/25/19   Cannady, Jolene T, NP  Blood Glucose Monitoring Suppl (ACCU-CHEK AVIVA PLUS) w/Device KIT 1 each by Does not apply route 5 (five) times daily. 04/05/23   Cannady, Jolene T, NP  glucose blood (ACCU-CHEK AVIVA PLUS) test strip 1 each by Other route 5 (five) times daily. Use as instructed 04/05/23   Cannady, Jolene T, NP  icosapent  Ethyl (VASCEPA ) 1 g capsule Take 1 capsule (1 g total) by mouth 2 (two) times daily. 05/25/22   Cannady, Jolene T, NP  Insulin  Degludec (TRESIBA ) 100 UNIT/ML SOLN Inject 40 Units into the skin 2 (two) times daily. Patient taking differently: Inject 45 Units into the skin 2 (two) times daily. 05/28/22   Cannady, Jolene T, NP  Insulin  Lispro Junior KwikPen (HUMALOG  JR) 100 UNIT/ML KwikPen Inject 42 Units into the skin 3 (three) times daily before meals. Patient not taking: Reported on 07/02/2023 06/28/22   [provider]  isosorbide  mononitrate (IMDUR ) 30 MG 24 hr tablet TAKE 1 TABLET BY MOUTH ONCE DAILY 03/01/23   Fernand Denyse LABOR, MD  Misc. Devices (PULSE OXIMETER FOR FINGER) MISC To check O2 saturations once daily with asthma and document + check if any shortness of breath 01/07/23   Cannady,  Jolene T, NP  nitroGLYCERIN  (NITROSTAT ) 0.4 MG SL tablet Place 1 tablet (0.4 mg total) under the tongue every 5 (five) minutes as needed for chest pain. 05/25/22   Cannady, Jolene T, NP  senna-docusate (SENOKOT-S) 8.6-50 MG tablet Take 1 tablet by mouth daily. 05/15/23   Malvina Alm DASEN, MD  sucralfate  (CARAFATE ) 1 g tablet Take 1 tablet (1 g total) by mouth 4 (four) times daily. 03/01/23 02/29/24  Fernand Denyse LABOR, MD  triamcinolone  cream (KENALOG ) 0.1 % Apply 1 Application topically 2 (two) times daily. Patient taking differently: Apply 1 Application topically 2 (two) times daily. Arms and legs 02/02/22   Cannady, Jolene T, NP  valACYclovir  (VALTREX ) 500 MG tablet Take 500 mg by mouth 3 (three) times daily.    [provider]  Vitamin D , Ergocalciferol , (DRISDOL ) 1.25 MG (50000 UNIT) CAPS capsule Take 50,000 Units by mouth once a week. 09/04/22   [provider]    Physical Exam: Vitals:   09/21/23 2118 09/22/23 0141  BP: (!) 106/59   Pulse: 86   Resp: 20   Temp: 99.1 F (37.3 C) 98 F (36.7 C)  TempSrc:  Oral  SpO2: 95%    Obese adult male, lying in bed, eyes closed, no acute distress Left eye is normal, the right eye has some mild redness and swelling in the right eyelid, no significant periorbital swelling, as pictured below.  No pain with eye movement.  Unable to keep his eyes open long enough to assess visual acuity. RRR, no murmurs, no peripheral edema Respiratory rate normal, lungs clear without rales or wheezes Abdomen soft no tenderness palpation or guarding, no ascites or distention Attention normal, affect normal, judgment insight appear normal   Data Reviewed: Basic metabolic panel shows mild hyponatremia, elevated glucose, slightly elevated creatinine LFTs normal Troponin negative, lactic acid negative CBC shows no leukocytosis or anemia CT of the orbits shows preseptal swelling, no postseptal inflammation or abscess Chest x-ray clear    Assessment  and Plan: * Preseptal cellulitis No pain with eye movement.  CT shows no abscess or infection in orbital socket - Cefazolin  IV  Dizziness Appears very dry.  - IV fluids overnight - Check orthostatics - Hold lisinopril     Coronary artery disease with chronic angina - Hold Imdur , lisinopril  - Continue Plavix , aspirin , statin   Hyponatremia Mild, asyptomatic  Class 1 obesity BMI 32 , complicates care  History of CVA (cerebrovascular accident) - Continue Plavix , aspirin , fibrate, Crestor   Uncontrolled diabetes mellitus with hyperglycemia, with long-term current use of insulin  (HCC) Hyperglycemia here, no acidosis to suggest euglycemic DKA with Jardiance . - Hold Jardiance  - Continue long-acting insulin  - Sliding scale corrections - Hold Januvia   Phenylketonuria (PKU) (HCC)    NAFLD (nonalcoholic fatty liver disease) - Continue resmetirom   Hyperlipidemia associated with type 2 diabetes mellitus (HCC) - Continue fibrate, Crestor   Hypertension associated with diabetes (HCC) Blood pressure soft here, appears dehydrated - Hold Jardiance , lisinopril , Imdur          Advance Care Planning: Full code  Consults: None needed  Family Communication: Wife by phone  Severity of Illness: The appropriate patient status for this patient is OBSERVATION. Observation status is judged to be reasonable and necessary in order to provide the required intensity of service to ensure the patient's safety. The patient's presenting symptoms, physical exam findings, and initial radiographic and laboratory data in the context of their medical condition is felt to place them at decreased risk for further clinical deterioration. Furthermore, it is anticipated that the patient will be medically stable for discharge from the hospital within 2 midnights of admission.   Author: Lonni SHAUNNA Dalton, MD 09/22/2023 1:54 AM  For on call review [email or web address].

## 2023-09-22 NOTE — Assessment & Plan Note (Signed)
-   Continue fibrate, Crestor

## 2023-09-22 NOTE — Care Management Obs Status (Signed)
 MEDICARE OBSERVATION STATUS NOTIFICATION   Patient Details  Name: [name]. MRN: [id number] Date of Birth: 02-16-1961   Medicare Observation Status Notification Given:  Yes    [place] L Addisen Chappelle, LCSW 09/22/2023, 11:21 AM

## 2023-09-22 NOTE — ED Notes (Signed)
Helped pt up to toilet for BM.

## 2023-09-22 NOTE — Assessment & Plan Note (Signed)
-   Hold Imdur , lisinopril  - Continue Plavix , aspirin , statin

## 2023-09-22 NOTE — Assessment & Plan Note (Signed)
 No pain with eye movement.  CT shows no abscess or infection in orbital socket - Cefazolin  IV

## 2023-09-22 NOTE — Assessment & Plan Note (Signed)
 Mild, asyptomatic

## 2023-09-22 NOTE — Progress Notes (Signed)
 Non billable note Patient admitted for right sided pre septal cellulitis in the setting of uncontrolled DM. Continue antibiotics with IV Ancef . Continue IVF hydration. Continue long acting insulin  and pre meal insulin  Will benefit from diabetic education

## 2023-09-22 NOTE — Assessment & Plan Note (Signed)
 Blood pressure soft here, appears dehydrated - Hold Jardiance , lisinopril , Imdur 

## 2023-09-22 NOTE — Assessment & Plan Note (Signed)
 Hyperglycemia here, no acidosis to suggest euglycemic DKA with Jardiance . - Hold Jardiance  - Continue long-acting insulin  - Sliding scale corrections - Hold Januvia 

## 2023-09-22 NOTE — Assessment & Plan Note (Signed)
 Appears very dry.  - IV fluids overnight - Check orthostatics - Hold lisinopril 

## 2023-09-22 NOTE — Assessment & Plan Note (Signed)
 BMI 32 , complicates care

## 2023-09-22 NOTE — Assessment & Plan Note (Signed)
-   Continue resmetirom

## 2023-09-22 NOTE — Assessment & Plan Note (Signed)
-   Continue Plavix , aspirin , fibrate, Crestor

## 2023-09-23 ENCOUNTER — Ambulatory Visit: Admitting: Nurse Practitioner

## 2023-09-23 DIAGNOSIS — E1169 Type 2 diabetes mellitus with other specified complication: Secondary | ICD-10-CM

## 2023-09-23 DIAGNOSIS — E701 Other hyperphenylalaninemias: Secondary | ICD-10-CM

## 2023-09-23 DIAGNOSIS — Z8673 Personal history of transient ischemic attack (TIA), and cerebral infarction without residual deficits: Secondary | ICD-10-CM

## 2023-09-23 DIAGNOSIS — E1165 Type 2 diabetes mellitus with hyperglycemia: Secondary | ICD-10-CM

## 2023-09-23 DIAGNOSIS — E559 Vitamin D deficiency, unspecified: Secondary | ICD-10-CM

## 2023-09-23 DIAGNOSIS — E1159 Type 2 diabetes mellitus with other circulatory complications: Secondary | ICD-10-CM

## 2023-09-23 DIAGNOSIS — Z23 Encounter for immunization: Secondary | ICD-10-CM

## 2023-09-23 DIAGNOSIS — R011 Cardiac murmur, unspecified: Secondary | ICD-10-CM

## 2023-09-23 DIAGNOSIS — L03213 Periorbital cellulitis: Principal | ICD-10-CM

## 2023-09-23 DIAGNOSIS — E538 Deficiency of other specified B group vitamins: Secondary | ICD-10-CM

## 2023-09-23 DIAGNOSIS — I209 Angina pectoris, unspecified: Secondary | ICD-10-CM

## 2023-09-23 DIAGNOSIS — R809 Proteinuria, unspecified: Secondary | ICD-10-CM

## 2023-09-23 LAB — CBC
HCT: 42.7 % (ref 39.0–52.0)
Hemoglobin: 14.6 g/dL (ref 13.0–17.0)
MCH: 31.5 pg (ref 26.0–34.0)
MCHC: 34.2 g/dL (ref 30.0–36.0)
MCV: 92 fL (ref 80.0–100.0)
Platelets: 111 K/uL — ABNORMAL LOW (ref 150–400)
RBC: 4.64 MIL/uL (ref 4.22–5.81)
RDW: 13 % (ref 11.5–15.5)
WBC: 5.6 K/uL (ref 4.0–10.5)
nRBC: 0 % (ref 0.0–0.2)

## 2023-09-23 LAB — GLUCOSE, CAPILLARY
Glucose-Capillary: 108 mg/dL — ABNORMAL HIGH (ref 70–99)
Glucose-Capillary: 168 mg/dL — ABNORMAL HIGH (ref 70–99)
Glucose-Capillary: 152 mg/dL — ABNORMAL HIGH (ref 70–99)
Glucose-Capillary: 267 mg/dL — ABNORMAL HIGH (ref 70–99)

## 2023-09-23 LAB — BASIC METABOLIC PANEL WITH GFR
Anion gap: 9 (ref 5–15)
BUN: 16 mg/dL (ref 6–20)
CO2: 20 mmol/L — ABNORMAL LOW (ref 22–32)
Calcium: 8.1 mg/dL — ABNORMAL LOW (ref 8.9–10.3)
Chloride: 107 mmol/L (ref 98–111)
Creatinine, Ser: 0.98 mg/dL (ref 0.61–1.24)
GFR, Estimated: >60 mL/min (ref >60)
Glucose, Bld: 154 mg/dL — ABNORMAL HIGH (ref 70–99)
Potassium: 3.3 mmol/L — ABNORMAL LOW (ref 3.5–5.1)
Sodium: 136 mmol/L (ref 135–145)

## 2023-09-23 MED ORDER — POTASSIUM CHLORIDE 20 MEQ PO PACK
40.0000 meq | PACK | Freq: Once | ORAL | Status: AC
  Administered 2023-09-23: 40 meq via ORAL
  Filled 2023-09-23: qty 2

## 2023-09-23 NOTE — Inpatient Diabetes Management (Signed)
 Inpatient Diabetes Program Recommendations  AACE/ADA: New Consensus Statement on Inpatient Glycemic Control (2015)  Target Ranges:  Prepandial:   less than 140 mg/dL      Peak postprandial:   less than 180 mg/dL (1-2 hours)      Critically ill patients:  140 - 180 mg/dL   Lab Results  Component Value Date   GLUCAP 168 (H) 09/23/2023   HGBA1C 11.5 (H) 09/22/2023    Review of Glycemic Control  Latest Reference Range & Units 09/22/23 10:52 09/22/23 16:35 09/22/23 21:27 09/23/23 08:33 09/23/23 11:49  Glucose-Capillary 70 - 99 mg/dL 657 (H) 842 (H) 818 (H) 108 (H) 168 (H)   Diabetes history: DM 2 Outpatient Diabetes medications:  Jardiance  10 mg daily Tresiba  45 units bid Humalog  42 units tid Current orders for Inpatient glycemic control:  Novolog  0-15 units tid with meals and HS Semglee  40 units daily  Inpatient Diabetes Program Recommendations:    Spoke to patient at bedside regarding elevated A1C of 11.5%.  He states that blood sugars are much higher at home.  I confirmed this with patient and discussed A1C.  He states that he eats a lot at home and needs to reduce his serving size.  He does have a sensor at home but states that he is going to have a family member come and help him put in on.  Discussed goal blood sugars and importance of f/u with PCP. He states that his wife gives him insulin  at home.  Will follow.    Thanks,  Alex Bullocks, RN, BC-ADM Inpatient Diabetes Coordinator Pager [phone]  (8a-5p)

## 2023-09-23 NOTE — Plan of Care (Signed)

## 2023-09-23 NOTE — Progress Notes (Addendum)
 Progress Note   Patient: [name]. FMW:[id number] DOB: 09-26-1960 DOA: 09/21/2023     1 DOS: the patient was seen and examined on 09/23/2023   Brief hospital course: 63 y.o. 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  Went to see his eye doctor recently for a routine appointment, noted redness and pain in the right eyelid. Prescribed oral antibiotics by his eye doctor.  He started these, but they did not seem to help and his nausea, poor appetite, then dizziness started.  Finally he was very weak and couldn't stand, so he came to the ER.     In the ER, CBC and electroyltes normal.  CT showed findings consistent with preseptal cellulitis.  Assessment and Plan: * Preseptal cellulitis No pain with eye movement.  CT of the orbits, shows no abscess or infection in orbital socket.  Patient with fever to 100.4 but no leukocytosis - Continue cefazolin  IV    Coronary artery disease with chronic angina  Denies chest pain this a.m. - Hold Imdur , lisinopril  - Continue Plavix , aspirin , statin  Dizziness (Resolved) Likely related to dehydration.  Noted to have elevated serum creatinine on admission.   Hyponatremia (resolved) Mild, asyptomatic.  Resolved with hydration  Class 1 obesity BMI 32 , complicates care  History of CVA (cerebrovascular accident) - Continue Plavix , aspirin , fibrate, Crestor   Uncontrolled diabetes mellitus with hyperglycemia, with long-term current use of insulin  (HCC) A1c 11.5 - Hold Jardiance  - Continue long-acting insulin  - Sliding scale corrections - Hold Januvia   Phenylketonuria (PKU) (HCC)    NAFLD (nonalcoholic fatty liver disease) - Continue resmetirom   Hyperlipidemia associated with type 2 diabetes mellitus (HCC) - Continue fibrate, Crestor   Hypertension associated with diabetes (HCC) Blood pressure initially soft here, now slightly  elevated - Hold Jardiance , lisinopril , Imdur , will resume lisinopril  in a.m.   Mild thrombocytopenia 111k<133K.  No indication for transfusion at this time. - Continue to monitor     Subjective: After finished eating half of spaghetti.  Felt nauseous.  Nausea resolved with Zofran . Takes reflux medicine at home. No chest pain. Ate something lighter with no nausea after that.   Physical Exam: Vitals:   09/22/23 1750 09/22/23 2120 09/23/23 0518 09/23/23 0814  BP: 125/70 (!) 148/82 (!) 155/90 136/74  Pulse: 84 78 73 62  Resp: 18 20 16 19   Temp: 98.3 F (36.8 C) 99.8 F (37.7 C) (!) 100.4 F (38 C) 97.8 F (36.6 C)  TempSrc: Oral Oral Oral   SpO2: 99% 99% 98% 98%  Weight:       Physical Exam  Constitutional: In no distress.  Eyes: R eye some periorbital edema.  Cardiovascular: Normal rate, regular rhythm. No lower extremity edema  Pulmonary: Non labored breathing on room air, no wheezing or rales.  No lower extremity edema Abdominal: Soft. Normal bowel sounds. Non distended and non tender Musculoskeletal: Normal range of motion.     Neurological: Alert and oriented to person, place, and time. Non focal  Skin: Skin is warm and dry.   Data Reviewed:     Latest Ref Rng & Units 09/23/2023   10:35 AM 09/22/2023   10:26 AM 09/21/2023    9:40 PM  CBC  WBC 4.0 - 10.5 K/uL 5.6  5.8  6.8   Hemoglobin 13.0 - 17.0 g/dL 85.3  85.1  84.6   Hematocrit 39.0 - 52.0 % 42.7  43.9  44.9   Platelets  150 - 400 K/uL 111  133  154       Latest Ref Rng & Units 09/23/2023   10:35 AM 09/22/2023   10:26 AM 09/21/2023    9:40 PM  BMP  Glucose 70 - 99 mg/dL 845  683  704   BUN 6 - 20 mg/dL 16  18  22    Creatinine 0.61 - 1.24 mg/dL 9.01  8.80  8.71   Sodium 135 - 145 mmol/L 136  133  132   Potassium 3.5 - 5.1 mmol/L 3.3  3.7  3.6   Chloride 98 - 111 mmol/L 107  103  101   CO2 22 - 32 mmol/L 20  20  21    Calcium  8.9 - 10.3 mg/dL 8.1  8.5  9.0      Family Communication: Discussed plan with patient.   All questions answered.  Patient verbalized understanding of the plan.  Disposition: Status is: Inpatient Remains inpatient appropriate because: IV antibiotics.  Planned Discharge Destination: Home    Time spent: 35 minutes  Author: Alban Pepper, MD 09/23/2023 2:43 PM  For on call review [email or web address].

## 2023-09-24 ENCOUNTER — Telehealth: Payer: Self-pay

## 2023-09-24 ENCOUNTER — Ambulatory Visit: Admitting: Internal Medicine

## 2023-09-24 DIAGNOSIS — L03213 Periorbital cellulitis: Secondary | ICD-10-CM | POA: Diagnosis not present

## 2023-09-24 LAB — CBC
HCT: 42.7 % (ref 39.0–52.0)
Hemoglobin: 14.3 g/dL (ref 13.0–17.0)
MCH: 30.5 pg (ref 26.0–34.0)
MCHC: 33.5 g/dL (ref 30.0–36.0)
MCV: 91 fL (ref 80.0–100.0)
Platelets: 113 K/uL — ABNORMAL LOW (ref 150–400)
RBC: 4.69 MIL/uL (ref 4.22–5.81)
RDW: 13 % (ref 11.5–15.5)
WBC: 4.4 K/uL (ref 4.0–10.5)
nRBC: 0 % (ref 0.0–0.2)

## 2023-09-24 LAB — BASIC METABOLIC PANEL WITH GFR
Anion gap: 10 (ref 5–15)
BUN: 17 mg/dL (ref 6–20)
CO2: 21 mmol/L — ABNORMAL LOW (ref 22–32)
Calcium: 8.2 mg/dL — ABNORMAL LOW (ref 8.9–10.3)
Chloride: 104 mmol/L (ref 98–111)
Creatinine, Ser: 1.24 mg/dL (ref 0.61–1.24)
GFR, Estimated: >60 mL/min (ref >60)
Glucose, Bld: 168 mg/dL — ABNORMAL HIGH (ref 70–99)
Potassium: 3.5 mmol/L (ref 3.5–5.1)
Sodium: 135 mmol/L (ref 135–145)

## 2023-09-24 LAB — GLUCOSE, CAPILLARY
Glucose-Capillary: 158 mg/dL — ABNORMAL HIGH (ref 70–99)
Glucose-Capillary: 182 mg/dL — ABNORMAL HIGH (ref 70–99)

## 2023-09-24 LAB — MAGNESIUM: Magnesium: 2.2 mg/dL (ref 1.7–2.4)

## 2023-09-24 MED ORDER — CEPHALEXIN 500 MG PO CAPS: 500.0000 mg | ORAL_CAPSULE | Freq: Two times a day (BID) | ORAL | 0 refills | Status: AC

## 2023-09-24 NOTE — Plan of Care (Signed)

## 2023-09-24 NOTE — TOC CM/SW Note (Signed)
 Transition of Care [facility]) - Inpatient Brief Assessment   Patient Details  Name: [name]. MRN: [id number] Date of Birth: 1960-07-30  [facility] CM/SW Contact:    Corean ONEIDA Haddock, RN Phone Number: 09/24/2023, 11:33 AM   Clinical Narrative:   Transition of Care [facility]) Screening Note   Patient Details  Name: [name]. Date of Birth: 11-Feb-1961   Transition of Care [facility]) CM/SW Contact:    Corean ONEIDA Haddock, RN Phone Number: 09/24/2023, 11:33 AM    Transition of Care Department [facility]) has reviewed patient and no TOC needs have been identified at this time. If new patient transition needs arise, please place a TOC consult.     Transition of Care Asessment: Insurance and Status: Insurance coverage has been reviewed Patient has primary care physician: Yes     Prior/Current Home Services: No current home services Social Drivers of Health Review: SDOH reviewed no interventions necessary Readmission risk has been reviewed: Yes Transition of care needs: no transition of care needs at this time

## 2023-09-24 NOTE — Care Management Important Message (Signed)
 Important Message  Patient Details  Name: [name]. MRN: [id number] Date of Birth: [birth year]   Important Message Given:  Yes - Medicare IM     [name] 09/24/2023, 11:55 AM

## 2023-09-24 NOTE — Discharge Instructions (Signed)
 Please use a warm compress (cloth) on your right eye for a few minutes every day to help with pain of the nodule near your eyebrow.   Please follow up with your PCP in 1 week to ensure your eye continues to improve.   Please continue antibiotics as prescribed.

## 2023-09-28 NOTE — Discharge Summary (Signed)
 Physician Discharge Summary   Patient: [name]. MRN: [id number] DOB: Jul 24, 1960  Admit date:     09/21/2023  Discharge date: 09/24/2023  Discharge Physician: Alban Pepper   PCP: Valerio Melanie DASEN, NP   Recommendations at discharge:    BMP to ensure creatinine is stable/improved. CBC to ensure thrombocytopenia resolved.   Discharge Diagnoses: Principal Problem:   Preseptal cellulitis Active Problems:   Phenylketonuria (PKU) (HCC)   Hypertension associated with diabetes (HCC)   Hyperlipidemia associated with type 2 diabetes mellitus (HCC)   NAFLD (nonalcoholic fatty liver disease)   Uncontrolled diabetes mellitus with hyperglycemia, with long-term current use of insulin  (HCC)   History of CVA (cerebrovascular accident)   Class 1 obesity   Hyponatremia   Dizziness   Coronary artery disease with chronic angina  Resolved Problems:   * No resolved hospital problems. *  Hospital Course: 63 y.o. 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  Went to see his eye doctor recently for a routine appointment, noted redness and pain in the right eyelid. Prescribed oral antibiotics by his eye doctor.  He started these, but they did not seem to help and his nausea, poor appetite, then dizziness started.  Finally he was very weak and couldn't stand, so he came to the ER.     In the ER, CBC and electroyltes normal.  CT showed findings consistent with preseptal cellulitis.  Patient was initially started on vancomycin  and cefazolin . He was eventually transitioned to ancef  only. Patient did spike a fever to 100.4 F but did not have a leukocytosis.   The erythema surrounding his r eye ultimately improved. And patient was discharged on a course of keflex .   Assessment and Plan: Preseptal cellulitis CT of the orbits, shows no abscess or infection in orbital socket. On the day of  discharge patient had no pain with eye movement or changes in vision.. The erythema surrounding his eye was improved. His last fever was 7/7 in the AM. He remained without without a leukocytosis.    Given the improvement in his symptoms patient was discharged with on a course of keflex . He will follow up with his PCP and outpatient optometrist to ensure his eye is continuing to improe.        Coronary artery disease with chronic angina   Patient denied any chest pain on the day of discharge or throughout this hospitalization. His imdur  and lisinopril  were resumed while inpatient. He was continued on Plavix , aspirin , statin   Dizziness (Resolved) Likely related to dehydration.  Noted to have elevated serum creatinine on admission. This did improve by discharge.     Hyponatremia (resolved) Mild, asyptomatic.  Resolved with hydration   Class 1 obesity BMI 32 , complicates care   History of CVA (cerebrovascular accident) - Continued on Plavix , aspirin , fibrate, Crestor    Uncontrolled diabetes mellitus with hyperglycemia, with long-term current use of insulin  (HCC) A1c 11.5 Jardiance  and januvia  resumed on discharge. Long acting insulin  also resumed. Patient reportedly taking 42u meal time. This was held and patient was instructed to follow up with PCP to further titration of this as he only required about 3u of meal time insulin .   Patient will discharge on home long acting regimen of 40u BID, because there was fair control of his Bgs on this dose.   He is prescribed 45u BID. Discussed that he should follow up with his PCP for  further titration of his antidiabetes regimen.     Phenylketonuria (PKU) (HCC)  Noted.    NAFLD (nonalcoholic fatty liver disease) - Continued on home resmetirom    Hyperlipidemia associated with type 2 diabetes mellitus (HCC) - Continued on home fibrate, Crestor    Hypertension associated with diabetes (HCC) Blood pressure initially soft here, home Jardiance ,  lisinopril , Imdur , were resumed.      Mild thrombocytopenia 111k nadir. Improving by discharge. Unclear etiology.  No indication for transfusion at this time.       Consultants: None Procedures performed: None  Disposition: Home Diet recommendation:  Discharge Diet Orders (From admission, onward)     Start     Ordered   09/24/23 0000  Diet Carb Modified        09/24/23 1706           Carb modified diet DISCHARGE MEDICATION: Allergies as of 09/24/2023       Reactions   Sulfa Antibiotics Shortness Of Breath, Nausea And Vomiting   Bee Venom Hives, Swelling   All kinds of bees   Morphine Nausea And Vomiting   Other    Certain powders (detergent)        Medication List     PAUSE taking these medications    Insulin  Lispro Junior KwikPen 100 UNIT/ML KwikPen Wait to take this until your doctor or other care provider tells you to start again. Commonly known as: HUMALOG  JR Inject 42 Units into the skin 3 (three) times daily before meals.       STOP taking these medications    sucralfate  1 g tablet Commonly known as: Carafate    triamcinolone  cream 0.1 % Commonly known as: KENALOG        TAKE these medications    Accu-Chek Aviva Plus test strip Generic drug: glucose blood 1 each by Other route 5 (five) times daily. Use as instructed   Accu-Chek Aviva Plus w/Device Kit 1 each by Does not apply route 5 (five) times daily.   Accu-Chek Softclix Lancets lancets 1 each by Other route 5 (five) times daily. Use as instructed   acetaminophen  500 MG tablet Commonly known as: TYLENOL  Take 500 mg by mouth every 6 (six) hours as needed.   albuterol  108 (90 Base) MCG/ACT inhaler Commonly known as: VENTOLIN  HFA Inhale 2 puffs into the lungs every 6 (six) hours as needed.   aspirin  EC 81 MG tablet Take 1 tablet (81 mg total) by mouth daily.   cephALEXin  500 MG capsule Commonly known as: KEFLEX  Take 1 capsule (500 mg total) by mouth 2 (two) times daily for 5  days.   clopidogrel  75 MG tablet Commonly known as: PLAVIX  Take 1 tablet (75 mg total) by mouth daily.   empagliflozin  25 MG Tabs tablet Commonly known as: Jardiance  Take 1 tablet (25 mg total) by mouth daily.   EPINEPHrine  0.3 mg/0.3 mL Soaj injection Commonly known as: EPI-PEN INJECT 1 SYRINGE INTO OUTER THIGH ONCE AS NEEDED FOR SEVERE ALLERGIC REACTION. Strength 0.3 MG/0.3ML   erythromycin ophthalmic ointment Place 1 Application into both eyes at bedtime.   fenofibrate  54 MG tablet Take 1 tablet (54 mg total) by mouth daily.   fluticasone  furoate-vilanterol 100-25 MCG/ACT Aepb Commonly known as: Breo Ellipta  Inhale 1 puff into the lungs daily.   gabapentin  300 MG capsule Commonly known as: NEURONTIN  Take 1 capsule (300 mg total) by mouth 2 (two) times daily.   gatifloxacin 0.5 % Soln Commonly known as: ZYMAXID Place 1 drop into both eyes 4 (four)  times daily.   icosapent  Ethyl 1 g capsule Commonly known as: VASCEPA  Take 1 capsule (1 g total) by mouth 2 (two) times daily.   Insulin  Degludec 100 UNIT/ML Soln Commonly known as: Tresiba  Inject 40 Units into the skin 2 (two) times daily. What changed: how much to take   isosorbide  mononitrate 30 MG 24 hr tablet Commonly known as: IMDUR  TAKE 1 TABLET BY MOUTH ONCE DAILY   lisinopril  20 MG tablet Commonly known as: ZESTRIL  Take 1 tablet (20 mg total) by mouth daily.   montelukast  10 MG tablet Commonly known as: SINGULAIR  Take 1 tablet (10 mg total) by mouth at bedtime.   mupirocin  ointment 2 % Commonly known as: BACTROBAN  Apply 1 Application topically 2 (two) times daily.   nitroGLYCERIN  0.4 MG SL tablet Commonly known as: NITROSTAT  Place 1 tablet (0.4 mg total) under the tongue every 5 (five) minutes as needed for chest pain.   pantoprazole  40 MG tablet Commonly known as: PROTONIX  TAKE 1 TABLET BY MOUTH ONCE DAILY   Pulse Oximeter For Finger Misc To check O2 saturations once daily with asthma and  document + check if any shortness of breath   Rezdiffra  60 MG Tabs Generic drug: Resmetirom  Take 1 tablet by mouth daily.   rosuvastatin  20 MG tablet Commonly known as: CRESTOR  Take 1 tablet (20 mg total) by mouth daily.   senna-docusate 8.6-50 MG tablet Commonly known as: Senokot-S Take 1 tablet by mouth daily.   sitaGLIPtin  100 MG tablet Commonly known as: Januvia  Take 1 tablet (100 mg total) by mouth daily.   valACYclovir  500 MG tablet Commonly known as: VALTREX  Take 500 mg by mouth 3 (three) times daily.   Vitamin D  (Ergocalciferol ) 1.25 MG (50000 UNIT) Caps capsule Commonly known as: DRISDOL  Take 50,000 Units by mouth once a week.        Follow-up Information     Valerio Melanie DASEN, NP. Call in 1 week(s).   Specialty: Nurse Practitioner Contact information: [address] [place] [address] [phone]                Discharge Exam: [name]   09/22/23 0946  Weight: 78 kg   Physical Exam  Constitutional: In no distress.  Eye: R eye surrounding erythema of eyelid improved, conjunctiva not injected, able to open eye without pain, Small nodule on mid eyebrow mild TTP Cardiovascular: Normal rate, regular rhythm. No lower extremity edema  Pulmonary: Non labored breathing on room air, no wheezing or rales.  Abdominal: Soft. Normal bowel sounds. Non distended and non tender Musculoskeletal: Normal range of motion.     Neurological: Alert and oriented to person, place, and time. Non focal  Skin: Skin is warm and dry.    Condition at discharge: improving  The results of significant diagnostics from this hospitalization (including imaging, microbiology, ancillary and laboratory) are listed below for reference.   Imaging Studies: CT Orbits W Contrast Result Date: 09/21/2023 CLINICAL DATA:  Initial evaluation for acute periorbital cellulitis. EXAM: CT ORBITS WITH CONTRAST TECHNIQUE: Multidetector CT images was performed according to the standard protocol  following intravenous contrast administration. RADIATION DOSE REDUCTION: This exam was performed according to the departmental dose-optimization program which includes automated exposure control, adjustment of the mA and/or kV according to patient size and/or use of iterative reconstruction technique. CONTRAST:  75mL OMNIPAQUE  IOHEXOL  300 MG/ML  SOLN COMPARISON:  None Available. FINDINGS: Orbits: Soft tissue swelling seen involving the right preseptal periorbital soft tissues. Changes are most pronounced at  the right supraorbital region and upper eyelid. Findings are concerning for acute preseptal periorbital cellulitis given provided history. No postseptal or intraorbital extension evident by CT at this time. No discrete abscess or drainable fluid collection. Right globe and orbital soft tissues otherwise within normal limits. No abnormality about the left globe or orbit. Prior bilateral ocular lens replacement noted. Visible paranasal sinuses: Mild chronic mucoperiosteal thickening present about the ethmoidal air cells. Paranasal sinuses are otherwise largely clear. Mastoid air cells and middle ear cavities are well pneumatized and free of fluid. Soft tissues: No other soft tissue abnormality about the visualized face. Osseous: No acute osseous finding. No discrete or worrisome osseous lesions. Limited intracranial: Unremarkable. IMPRESSION: Soft tissue swelling involving the right preseptal periorbital soft tissues, concerning for acute preseptal periorbital cellulitis given provided history. No postseptal or intraorbital extension evident by CT at this time. No discrete abscess or drainable fluid collection. Electronically Signed   By: Morene Hoard M.D.   On: 09/21/2023 23:21   DG Chest Port 1 View Result Date: 09/21/2023 CLINICAL DATA:  Weakness EXAM: PORTABLE CHEST 1 VIEW COMPARISON:  10/08/2022 FINDINGS: Heart and mediastinal contours are within normal limits. No focal opacities or effusions. No  acute bony abnormality. IMPRESSION: No active disease. Electronically Signed   By: Franky Crease M.D.   On: 09/21/2023 22:06    Microbiology: Results for orders placed or performed during the hospital encounter of 10/08/22  MRSA culture     Status: None   Collection Time: 10/09/22  2:15 AM   Specimen: Nasal; Body Fluid  Result Value Ref Range Status   Specimen Description   Final    NASAL SWAB Performed at [facility], [address], [place], [place] [address]    Special Requests   Final    NONE Performed at [facility], [address] [place] [place], [place] [address]    Culture   Final    NO STAPHYLOCOCCUS AUREUS ISOLATED Performed at [facility], [address], [place], [place] [address]    Report Status 10/09/2022 FINAL  Final  Aerobic/Anaerobic Culture w Gram Stain (surgical/deep wound)     Status: None   Collection Time: 10/09/22 12:13 PM   Specimen: Back  Result Value Ref Range Status   Specimen Description   Final    BACK Performed at [facility], [address], [place], [place] [address]    Special Requests   Final    NONE Performed at [facility], [address] [place], [place] [address]    Gram Stain   Final    RARE WBC PRESENT,BOTH PMN AND MONONUCLEAR RARE GRAM POSITIVE COCCI IN CLUSTERS    Culture   Final    MODERATE METHICILLIN RESISTANT STAPHYLOCOCCUS AUREUS NO ANAEROBES ISOLATED Performed at [facility], [address], [place], [place] [address]    Report Status 10/14/2022 FINAL  Final   Organism ID, Bacteria METHICILLIN RESISTANT STAPHYLOCOCCUS AUREUS  Final      Susceptibility   Methicillin resistant staphylococcus aureus - MIC*    CIPROFLOXACIN >=8 RESISTANT Resistant     ERYTHROMYCIN >=8 RESISTANT Resistant     GENTAMICIN <=0.5 SENSITIVE Sensitive     OXACILLIN >=4 RESISTANT Resistant     TETRACYCLINE <=1 SENSITIVE Sensitive     VANCOMYCIN  <=0.5 SENSITIVE Sensitive     TRIMETH/SULFA >=320  RESISTANT Resistant     CLINDAMYCIN <=0.25 SENSITIVE Sensitive     RIFAMPIN <=0.5 SENSITIVE Sensitive  Inducible Clindamycin NEGATIVE Sensitive     LINEZOLID  2 SENSITIVE Sensitive     * MODERATE METHICILLIN RESISTANT STAPHYLOCOCCUS AUREUS    Labs: CBC: Recent Labs  Lab 09/21/23 2140 09/22/23 1026 09/23/23 1035 09/24/23 0330  WBC 6.8 5.8 5.6 4.4  NEUTROABS 5.6  --   --   --   HGB 15.3 14.8 14.6 14.3  HCT 44.9 43.9 42.7 42.7  MCV 90.7 91.8 92.0 91.0  PLT 154 133* 111* 113*   Basic Metabolic Panel: Recent Labs  Lab 09/21/23 2140 09/22/23 1026 09/23/23 1035 09/24/23 0330  NA 132* 133* 136 135  K 3.6 3.7 3.3* 3.5  CL 101 103 107 104  CO2 21* 20* 20* 21*  GLUCOSE 295* 316* 154* 168*  BUN 22* 18 16 17   CREATININE 1.28* 1.19 0.98 1.24  CALCIUM  9.0 8.5* 8.1* 8.2*  MG  --   --   --  2.2   Liver Function Tests: Recent Labs  Lab 09/21/23 2140  AST 24  ALT 37  ALKPHOS 121  BILITOT 1.0  PROT 7.5  ALBUMIN 4.2   CBG: Recent Labs  Lab 09/23/23 1149 09/23/23 1626 09/23/23 2038 09/24/23 0749 09/24/23 1226  GLUCAP 168* 152* 267* 158* 182*    Discharge time spent: greater than 30 minutes.  Signed: Alban Pepper, MD [facility] 09/28/2023

## 2023-09-30 DIAGNOSIS — R809 Proteinuria, unspecified: Secondary | ICD-10-CM | POA: Diagnosis not present

## 2023-09-30 DIAGNOSIS — E785 Hyperlipidemia, unspecified: Secondary | ICD-10-CM | POA: Diagnosis not present

## 2023-09-30 DIAGNOSIS — E1129 Type 2 diabetes mellitus with other diabetic kidney complication: Secondary | ICD-10-CM | POA: Diagnosis not present

## 2023-09-30 DIAGNOSIS — E1159 Type 2 diabetes mellitus with other circulatory complications: Secondary | ICD-10-CM | POA: Diagnosis not present

## 2023-09-30 DIAGNOSIS — E1169 Type 2 diabetes mellitus with other specified complication: Secondary | ICD-10-CM | POA: Diagnosis not present

## 2023-09-30 DIAGNOSIS — E1165 Type 2 diabetes mellitus with hyperglycemia: Secondary | ICD-10-CM | POA: Diagnosis not present

## 2023-09-30 DIAGNOSIS — I152 Hypertension secondary to endocrine disorders: Secondary | ICD-10-CM | POA: Diagnosis not present

## 2023-09-30 DIAGNOSIS — E701 Other hyperphenylalaninemias: Secondary | ICD-10-CM | POA: Diagnosis not present

## 2023-09-30 DIAGNOSIS — Z794 Long term (current) use of insulin: Secondary | ICD-10-CM | POA: Diagnosis not present

## 2023-09-30 NOTE — Progress Notes (Signed)
 Chief complaint: Diabetes  History of present illness: [name] is 63 y.o. male seen to follow up for type II diabetes. Unfortunately, he is not accompanied by his wife, Madelin, today (she is in the lobby area). She manages and administers his medications for him. He has poor cognition and is a fair historian. He was last seen in office on 10/29/2022. Did not show to scheduled follow up on 08/23/2023.   Since his last visit, he was hospitalized from 09/21/23 - 09/24/23 for periorbital cellulitis of the right eye. Hb A1c was 11.5% on during admission. He was discharged on much lower doses of his insulins: Tresiba  40 units BID, and Humalog  3u TID AC (was reportedly 42 units with each meal per patient recall).  Hb A1c today is 11.1% (however last was drawn only a week ago). He is monitoring his sugars frequently (>10 times/day) via fingerstick with the Accu-chek glucometer. Data was downloaded and reviewed. Over the last week, his sugars have ranged between 99 - 168 mg/dl which is much improved since being home. Prior to his hospitalizations, his sugars ranged between 287 - 486 mg/dl. He states he has cut back on eating breads, and has avoided all regular sodas and sweet teas (now only has sugar-free flavor mix-ins with his water). He has also lost a few pounds and has been eating less snacks and more vegetables during his meals.   He states he is discouraged by his A1c still being high and expresses he needs his A1c to come down so he can pursue a surgery to remove a cyst from his left knee which is causing him a great deal of stress/pain. He is currently taking Jardiance  25 mg QD, Tresiba  40 units BID and Humalog  8 units TID AC (per wife's recall when asked after the visit). He confirms he has sufficient supply of his medications at this time and have not had any issues with low sugars recently. He does endorse a fear of hypoglycemia due to a single low sugar episode (in the 40's) that resolved after  eating some fruit with juice. He has tried to use DexCom sensors, but did not like them because they fell off early.  Diabetes is complicated by CVD (h/o stroke), CKD stage 2, and microalbuminuria (follows with Dr. Douglas at [facility]). He is taking and tolerating an ACE-I, a statin, fenofibrate  and Vascepa . He last had a retinal scan on 08/15/2022. Per review of his records he has h/o pancreatitis, so this limits his treatment options. He also has PKU and requires additional dietary restrictions, but does not follow them routinely.  Previously tried medications for diabetes: Metformin  -- d/c due to intolerable GI upset.    Past Medical History:  Diagnosis Date  . Arthritis   . Asthma without status asthmaticus (HHS-HCC)   . Chest pain   . Diabetes mellitus type 2, uncomplicated (CMS/HHS-HCC)   . Diverticulitis   . Emphysema of lung (CMS/HHS-HCC)   . Fatigue   . Gallbladder polyp   . GERD (gastroesophageal reflux disease)   . Hyperglycemia   . Hyperlipidemia   . Hypertension   . IBS (irritable bowel syndrome)   . Left wrist pain   . Pancreatitis (HHS-HCC)   . PKU (phenylketonuria) ()   . Renal cyst, left   . Right shoulder pain   . Steatohepatitis     Past Surgical History:  Procedure Laterality Date  . CHOLECYSTECTOMY    . COLONOSCOPY    . knee surgery Left  cyst removal  . ORAL SURGERY OPERATIVE NOTE    . Rubberband ligation    . Trigger finger release      Outpatient Medications Marked as Taking for the 09/30/23 encounter (Office Visit) with Brendia Calton Squires, PA  Medication Sig Dispense Refill  . acetaminophen  (TYLENOL ) 500 mg capsule Take 500 mg by mouth as needed for Fever    . albuterol  90 mcg/actuation inhaler Inhale 2 inhalations into the lungs every 6 (six) hours as needed for Wheezing    . atorvastatin  (LIPITOR) 80 MG tablet Take 80 mg by mouth once daily    . clopidogreL  (PLAVIX ) 75 mg tablet TAKE 1 TABLET BY MOUTH ONCE DAILY 90 tablet 0  .  empagliflozin  (JARDIANCE ) 25 mg tablet Take 25 mg by mouth daily with breakfast    . EPINEPHrine  (EPIPEN ) 0.3 mg/0.3 mL auto-injector as needed    . fenofibrate  54 MG tablet Take 54 mg by mouth once daily    . fluticasone  furoate-vilanteroL (BREO ELLIPTA ) 100-25 mcg/dose DsDv inhaler Inhale 1 inhalation into the lungs once daily    . icosapent  ethyL (VASCEPA ) 1 gram capsule Take 1 g by mouth 2 (two) times daily    . insulin  DEGLUDEC (TRESIBA  FLEXTOUCH U-100) pen injector (concentration 100 units/mL) Inject 80 Units subcutaneously 2 (two) times daily 40 units bid    . insulin  LISPRO (HUMALOG  KWIKPEN) pen injector (concentration 100 units/mL) Inject 42 Units subcutaneously 3 (three) times daily with meals 45 mL 11  . lisinopril  (PRINIVIL ,ZESTRIL ) 20 MG tablet   0  . montelukast  (SINGULAIR ) 10 mg tablet Take 10 mg by mouth nightly    . mupirocin  (BACTROBAN ) 2 % ointment Apply 1 Application topically 2 (two) times daily    . nitroGLYcerin  (NITROSTAT ) 0.4 MG SL tablet Place 0.4 mg under the tongue every 5 (five) minutes as needed for Chest pain May take up to 3 doses.    . REZDIFFRA  60 mg Tab Take 1 tablet by mouth once daily    . rosuvastatin  (CRESTOR ) 40 MG tablet Take 40 mg by mouth once daily      Exam: BP 114/64   Pulse 74   Ht 160 cm (5' 3)   Wt 72.6 kg (160 lb)   SpO2 96%   BMI 28.34 kg/m  GEN: well developed, well nourished, in NAD. HEENT: No proptosis. EOMI. No lid lag or stare.  NEURO: PERRL.   PSYC: alert and oriented, fair insight. Cognition is impaired.   Labs Lab Results  Component Value Date   HGBA1C 11.1 (H) 09/30/2023   Hemoglobin A1c - External (09/22/2023) Component Ref Range & Units 8 d ago  Hgb A1c MFr Bld 4.8 - 5.6 % 11.5 High    Basic metabolic panel with GFR (09/24/2023) Order: [id number] Component Ref Range & Units 6 d ago  Sodium 135 - 145 mmol/L 135  Potassium 3.5 - 5.1 mmol/L 3.5  Chloride 98 - 111 mmol/L 104  CO2 22 - 32 mmol/L 21 Low   Glucose,  Bld 70 - 99 mg/dL 831 High   Comment: Glucose reference range applies only to samples taken after fasting for at least 8 hours.  BUN 6 - 20 mg/dL 17  Creatinine, Ser 9.38 - 1.24 mg/dL 8.75  Calcium  8.9 - 10.3 mg/dL 8.2 Low   GFR, Estimated >60 mL/min >60  Comment: (NOTE) Calculated using the CKD-EPI Creatinine Equation (2021)  Anion gap 5 - 15 10   Lipid Panel w/o Chol/HDL Ratio (05/07/2023) Order: [id number] Component Ref Range &  Units 4 mo ago  Cholesterol, Total 100 - 199 mg/dL 834  Triglycerides 0 - 149 mg/dL 630 High   HDL >60 mg/dL 28 Low   VLDL Cholesterol Cal 5 - 40 mg/dL 59 High   LDL Chol Calc (NIH) 0 - 99 mg/dL 78    Assessment: 1. Type 2 diabetes mellitus with hyperglycemia, with long-term current use of insulin  (CMS/HHS-HCC)   2. Type 2 diabetes mellitus with diabetic microalbuminuria, with long-term current use of insulin  (CMS/HHS-HCC)   3. Type 2 diabetes mellitus with other circulatory complication, with long-term current use of insulin  (CMS/HHS-HCC)   4. Hyperlipidemia associated with type 2 diabetes mellitus (CMS/HHS-HCC)   5. Hypertension associated with type 2 diabetes mellitus (CMS/HHS-HCC)   6. PKU (phenylketonuria) ()     Plan: - Diabetes is improved since his recent hospitalization last week and blood sugars are regularly at target range. Congratulated him on improved glycemic control! Encouraged him to continue his medications as prescribed as well as maintain good dietary habits (avoid sugary beverages, increase consumption of vegetables and limit overall portion sizes of meals). He is very discouraged at the fact that his A1c is still high (even though it has only been a week since it was last checked and has technically come down from 11.5 --> 11.1%). We discussed, at length, that it will take time (several months) for his Hb A1c to reflect any significant changes, but he should keep up the good work so that it can be lower the next time it is  checked! - Continue Tresiba  40 units BID, Humalog  8 units TID AC, and Jardiance  25 mg QD.   - Instructed to monitor blood sugars at least 4-5 times per day. He does a wonderful job at checking his sugars often throughout the day. Reminded him to bring blood sugar log and/or meter to every visit. He prefers to continue checking via fingerstick at this time and declines CGMS. - Continue statin, fenofibrate  and ACE-I.  - Continue annual eye exams. He is up to date on this. - Continue follow up with nephrology as planned.   - Follow up in 3 months, or sooner should any other issues arise, with STAT A1c at the time of his visit.    Above plan was also discussed with his wife outside of the office, since she manages his medications for him. She verbalized understanding and will continue to encourage him to make good choices in his diet.    Attestation Statement:   I personally performed the service, non-incident to. (WP)   CASSANDRA BUEL COHN, PA

## 2023-10-01 DIAGNOSIS — H16141 Punctate keratitis, right eye: Secondary | ICD-10-CM | POA: Diagnosis not present

## 2023-10-01 DIAGNOSIS — E113293 Type 2 diabetes mellitus with mild nonproliferative diabetic retinopathy without macular edema, bilateral: Secondary | ICD-10-CM | POA: Diagnosis not present

## 2023-10-01 DIAGNOSIS — H1789 Other corneal scars and opacities: Secondary | ICD-10-CM | POA: Diagnosis not present

## 2023-10-01 DIAGNOSIS — Z961 Presence of intraocular lens: Secondary | ICD-10-CM | POA: Diagnosis not present

## 2023-10-01 DIAGNOSIS — H04123 Dry eye syndrome of bilateral lacrimal glands: Secondary | ICD-10-CM | POA: Diagnosis not present

## 2023-10-01 LAB — HM DIABETES EYE EXAM

## 2023-10-03 NOTE — Congregational Nurse Program (Signed)
  Dept: [phone]   Congregational Nurse Program Note  Date of Encounter: 10/03/2023  Past Medical History: Past Medical History:  Diagnosis Date   Arrhythmia    Asthma    CCC (chronic calculous cholecystitis)    COPD (chronic obstructive pulmonary disease) (HCC)    Cyst of kidney, acquired    Diabetes mellitus without complication (HCC) 2013   type 2   Diverticulosis    Edentulous    Fatty liver    Gallbladder polyp    GERD (gastroesophageal reflux disease)    Heart murmur    History of chicken pox    History of measles as a child    History of PKU    Hyperlipidemia    Hypertension    IBS (irritable bowel syndrome)    Irregular heart beat    Mentally challenged    Pancreatitis    PONV (postoperative nausea and vomiting)    Stroke (HCC) 03/02/2019   vision issues - right eye/right sided weakness   Vision impairment    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 Yes    Student Assistance N/A    Location Patient Served  [facility]    Encounter Setting CN site    Population Status Unknown    Insurance Medicaid    Insurance/Financial Assistance Referral N/A    Medication N/A    Medical Provider Yes    Screening Referrals Made N/A    Medical Referrals Made N/A    Medical Appointment Completed N/A    CNP Interventions Advocate/Support    Screenings CN Performed Blood Pressure    ED Visit Averted N/A    Life-Saving Intervention Made N/A           Today's Vitals   10/03/23 1154  BP: 125/79   There is no height or weight on file to calculate BMI.   Patient has DM Type II and had numerous questions about his A1C.  Nurse provided handout for A1C and how to manage diabetes.  Also given handout for eat this not that.

## 2023-10-04 ENCOUNTER — Ambulatory Visit: Payer: Self-pay

## 2023-10-04 ENCOUNTER — Emergency Department
Admission: EM | Admit: 2023-10-04 | Discharge: 2023-10-04 | Disposition: A | Discharge status: Home / Self Care | Attending: Emergency Medicine | Admitting: Emergency Medicine

## 2023-10-04 ENCOUNTER — Other Ambulatory Visit: Payer: Self-pay

## 2023-10-04 DIAGNOSIS — E785 Hyperlipidemia, unspecified: Secondary | ICD-10-CM | POA: Insufficient documentation

## 2023-10-04 DIAGNOSIS — I10 Essential (primary) hypertension: Secondary | ICD-10-CM | POA: Insufficient documentation

## 2023-10-04 DIAGNOSIS — E119 Type 2 diabetes mellitus without complications: Secondary | ICD-10-CM | POA: Diagnosis not present

## 2023-10-04 DIAGNOSIS — R197 Diarrhea, unspecified: Secondary | ICD-10-CM | POA: Insufficient documentation

## 2023-10-04 LAB — GASTROINTESTINAL PANEL BY PCR, STOOL (REPLACES STOOL CULTURE)

## 2023-10-04 LAB — CBC
HCT: 39.1 % (ref 39.0–52.0)
Hemoglobin: 13.1 g/dL (ref 13.0–17.0)
MCH: 31 pg (ref 26.0–34.0)
MCHC: 33.5 g/dL (ref 30.0–36.0)
MCV: 92.7 fL (ref 80.0–100.0)
Platelets: 229 K/uL (ref 150–400)
RBC: 4.22 MIL/uL (ref 4.22–5.81)
RDW: 13.6 % (ref 11.5–15.5)
WBC: 5.6 K/uL (ref 4.0–10.5)
nRBC: 0 % (ref 0.0–0.2)

## 2023-10-04 LAB — URINALYSIS, ROUTINE W REFLEX MICROSCOPIC
Bacteria, UA: NONE SEEN
Bilirubin Urine: NEGATIVE
Glucose, UA: >=500 mg/dL — AB
Hgb urine dipstick: NEGATIVE
Ketones, ur: 5 mg/dL — AB
Leukocytes,Ua: NEGATIVE
Nitrite: NEGATIVE
Protein, ur: NEGATIVE mg/dL
Specific Gravity, Urine: 1.022 (ref 1.005–1.030)
pH: 5 (ref 5.0–8.0)

## 2023-10-04 LAB — COMPREHENSIVE METABOLIC PANEL WITH GFR
ALT: 67 U/L — ABNORMAL HIGH (ref 0–44)
AST: 52 U/L — ABNORMAL HIGH (ref 15–41)
Albumin: 3.9 g/dL (ref 3.5–5.0)
Alkaline Phosphatase: 91 U/L (ref 38–126)
Anion gap: 7 (ref 5–15)
BUN: 8 mg/dL (ref 6–20)
CO2: 22 mmol/L (ref 22–32)
Calcium: 8.9 mg/dL (ref 8.9–10.3)
Chloride: 114 mmol/L — ABNORMAL HIGH (ref 98–111)
Creatinine, Ser: 0.86 mg/dL (ref 0.61–1.24)
GFR, Estimated: >60 mL/min (ref >60)
Glucose, Bld: 136 mg/dL — ABNORMAL HIGH (ref 70–99)
Potassium: 4.1 mmol/L (ref 3.5–5.1)
Sodium: 143 mmol/L (ref 135–145)
Total Bilirubin: 0.7 mg/dL (ref 0.0–1.2)
Total Protein: 7 g/dL (ref 6.5–8.1)

## 2023-10-04 LAB — C DIFFICILE QUICK SCREEN W PCR REFLEX
C Diff antigen: NEGATIVE
C Diff interpretation: NOT DETECTED
C Diff toxin: NEGATIVE

## 2023-10-04 MED ORDER — LOPERAMIDE HCL 2 MG PO CAPS
4.0000 mg | ORAL_CAPSULE | Freq: Once | ORAL | Status: AC
  Administered 2023-10-04: 4 mg via ORAL
  Filled 2023-10-04: qty 2

## 2023-10-04 NOTE — ED Triage Notes (Signed)
 Pt ambulatory with steady gait with c/o of chronic diarrhea, pt denies pain, pt endorses sore butt. Pt seen recently for same.

## 2023-10-04 NOTE — Discharge Instructions (Addendum)
 Use Imodium to help with your diarrhea and to slow down your stool.  1 tablet (2 mg) up to 4-8 times per day  Follow-up with the GI doctor, call the attached clinic

## 2023-10-04 NOTE — Telephone Encounter (Signed)
 FYI Only or Action Required?: FYI only for provider.  Patient was last seen in primary care on 08/05/2023 by Fernand Denyse LABOR, MD.  Called Nurse Triage reporting Diarrhea.  Symptoms began several weeks ago. Symptoms started when patient was dc'd from hospital and placed on ABX  Interventions attempted: OTC medications: Immodium and Rest, hydration, or home remedies.  Symptoms are: gradually worsening.  Triage Disposition: See Physician Within 24 Hours- no appt avail. This RN advising patient to go to ED vs UC since his symptoms have persisted for 2 weeks.   Patient/caregiver understands and will follow disposition?:     Copied from CRM [id number]. Topic: Clinical - Red Word Triage >> Oct 04, 2023 11:54 AM Selinda RAMAN wrote: Red Word that prompted transfer to Nurse Triage:The spouse of the patient called in stating the patient got out of the hospital not too long ago for cellulitis in his eye and since he has been home he has had worsening diarrhea. She says he has used Immodium and Vaseline for his very sorry bottom but nothing is helping. I will transfer her to E2C2 NT Reason for Disposition  [1] SEVERE diarrhea (e.g., 7 or more times / day more than normal) AND [2] present > 24 hours (1 day)  Answer Assessment - Initial Assessment Questions 1. DIARRHEA SEVERITY: How bad is the diarrhea? How many more stools have you had in the past 24 hours than normal?      10-12 days per day  2. ONSET: When did the diarrhea begin?      Started July 8th, after discharging from the hospital  3. STOOL DESCRIPTION:  How loose or watery is the diarrhea? What is the stool color? Is there any blood or mucous in the stool?     Watery and some stuff in it and looks red and squares  4. VOMITING: Are you also vomiting? If Yes, ask: How many times in the past 24 hours?      No  5. ABDOMEN PAIN: Are you having any abdomen pain? If Yes, ask: What does it feel like? (e.g., crampy, dull,  intermittent, constant)      Stomach is sore  6. ABDOMEN PAIN SEVERITY: If present, ask: How bad is the pain?  (e.g., Scale 1-10; mild, moderate, or severe)     More tight than sore  7. ORAL INTAKE: If vomiting, Have you been able to drink liquids? How much liquids have you had in the past 24 hours?     Drinking and tolerating fluids   8. HYDRATION: Any signs of dehydration? (e.g., dry mouth [not just dry lips], too weak to stand, dizziness, new weight loss) When did you last urinate?     Feels a little weak, but doesn't feel dehydrated  9. EXPOSURE: Have you traveled to a foreign country recently? Have you been exposed to anyone with diarrhea? Could you have eaten any food that was spoiled?     No  10. ANTIBIOTIC USE: Are you taking antibiotics now or have you taken antibiotics in the past 2 months?       Yes, discharged from hospital on 7/8 and started on Keflex   11. OTHER SYMPTOMS: Do you have any other symptoms? (e.g., fever, blood in stool)       No  Protocols used: Diarrhea-A-AH

## 2023-10-04 NOTE — ED Provider Notes (Signed)
 [facility] Provider Note    Event Date/Time   First MD Initiated Contact with Patient 10/04/23 1521     (approximate)   History   Diarrhea   HPI  Alex M Hargrove Jr. is a 63 y.o. male who presents to the ED for evaluation of Diarrhea   Review of medical DC summary from 10 days ago.  Admitted for preseptal cellulitis.  Otherwise history phenylketonuria, NAFLD, HTN, HLD and DM.  Patient presents to the ED for evaluation about 2 weeks of persistent watery stool.  Reports symptoms started while he was in the hospital and persisted since he was discharged.  Minimal diffuse abdominal cramping but no severe pain, fevers, emesis or urinary changes.  Estimates watery stool every 15 minutes at home.   Physical Exam   Triage Vital Signs: ED Triage Vitals  Encounter Vitals Group     BP 10/04/23 1337 128/80     Girls Systolic BP Percentile --      Girls Diastolic BP Percentile --      Boys Systolic BP Percentile --      Boys Diastolic BP Percentile --      Pulse Rate 10/04/23 1337 60     Resp 10/04/23 1337 16     Temp 10/04/23 1337 97.7 F (36.5 C)     Temp src --      SpO2 10/04/23 1337 98 %     Weight 10/04/23 1334 171 lb 15.3 oz (78 kg)     Height 10/04/23 1334 5' 4 (1.626 m)     Head Circumference --      Peak Flow --      Pain Score 10/04/23 1334 0     Pain Loc --      Pain Education --      Exclude from Growth Chart --     Most recent vital signs: Vitals:   10/04/23 1337 10/04/23 1756  BP: 128/80 (!) 137/94  Pulse: 60 (!) 54  Resp: 16 17  Temp: 97.7 F (36.5 C) 98.1 F (36.7 C)  SpO2: 98% 100%    General: Awake, no distress.  Ambulatory and generally well-appearing CV:  Good peripheral perfusion.  Resp:  Normal effort.  Abd:  No distention.  Soft and nontender MSK:  No deformity noted.  Neuro:  No focal deficits appreciated. Other:     ED Results / Procedures / Treatments   Labs (all labs ordered are listed, but only  abnormal results are displayed) Labs Reviewed  COMPREHENSIVE METABOLIC PANEL WITH GFR - Abnormal; Notable for the following components:      Result Value   Chloride 114 (*)    Glucose, Bld 136 (*)    AST 52 (*)    ALT 67 (*)    All other components within normal limits  URINALYSIS, ROUTINE W REFLEX MICROSCOPIC - Abnormal; Notable for the following components:   Color, Urine YELLOW (*)    APPearance CLEAR (*)    Glucose, UA >=500 (*)    Ketones, ur 5 (*)    All other components within normal limits  C DIFFICILE QUICK SCREEN W PCR REFLEX    GASTROINTESTINAL PANEL BY PCR, STOOL (REPLACES STOOL CULTURE)  CBC    EKG   RADIOLOGY   Official radiology report(s): No results found.  PROCEDURES and INTERVENTIONS:  Procedures  Medications  loperamide (IMODIUM) capsule 4 mg (has no administration in time range)     IMPRESSION / MDM / ASSESSMENT AND PLAN /  ED COURSE  I reviewed the triage vital signs and the nursing notes.  Differential diagnosis includes, but is not limited to, gastroenteritis, colitis, foodborne illness, C. difficile, SBO  {Patient presents with symptoms of an acute illness or injury that is potentially life-threatening.  Patient presents with subacute loose stools with negative C. difficile screening and suitable for outpatient management and GI follow-up.  Normal CBC.  Minimal transaminitis.  UA without infectious features.  CT screening sent due to his recent admission for antibiotics for preseptal cellulitis.  Stool cultures pending but he is nontoxic without bloody stool and believe can be managed supportively at this point.  Discussed Imodium and GI follow-up and ED return precautions.  He is suitable for outpatient management  Clinical Course as of 10/04/23 1859  Fri Oct 04, 2023  1604 Reviewed colonoscopy from 2022, couple polyps removed, internal hemorrhoids. [DS]  1858 We discussed negative C. difficile screening, pending GI panel, loperamide dosing  at home and GI follow-up.  Answered questions.  Remains with a benign abdomen without significant pain or tenderness so I do not believe cross-sectional imaging is necessary.  Discussed ED return precautions [DS]    Clinical Course User Index [DS] Claudene Rover, MD     FINAL CLINICAL IMPRESSION(S) / ED DIAGNOSES   Final diagnoses:  Diarrhea, unspecified type     Rx / DC Orders   ED Discharge Orders     None        Note:  This document was prepared using Dragon voice recognition software and may include unintentional dictation errors.   Claudene Rover, MD 10/04/23 1900

## 2023-10-07 NOTE — Telephone Encounter (Signed)
 Was seen in ED and advised for outpatient management.

## 2023-10-10 ENCOUNTER — Ambulatory Visit (INDEPENDENT_AMBULATORY_CARE_PROVIDER_SITE_OTHER): Payer: Medicare Other | Admitting: Vascular Surgery

## 2023-10-10 ENCOUNTER — Encounter (INDEPENDENT_AMBULATORY_CARE_PROVIDER_SITE_OTHER): Payer: Medicare Other

## 2023-10-17 LAB — GLUCOSE, POCT (MANUAL RESULT ENTRY): POC Glucose: 141 mg/dL — AB (ref 70–99)

## 2023-10-17 NOTE — Congregational Nurse Program (Signed)
  Dept: [phone]   Congregational Nurse Program Note  Date of Encounter: 10/17/2023  Past Medical History: Past Medical History:  Diagnosis Date   Arrhythmia    Asthma    CCC (chronic calculous cholecystitis)    COPD (chronic obstructive pulmonary disease) (HCC)    Cyst of kidney, acquired    Diabetes mellitus without complication (HCC) 2013   type 2   Diverticulosis    Edentulous    Fatty liver    Gallbladder polyp    GERD (gastroesophageal reflux disease)    Heart murmur    History of chicken pox    History of measles as a child    History of PKU    Hyperlipidemia    Hypertension    IBS (irritable bowel syndrome)    Irregular heart beat    Mentally challenged    Pancreatitis    PONV (postoperative nausea and vomiting)    Stroke (HCC) 03/02/2019   vision issues - right eye/right sided weakness   Vision impairment    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 Yes    Student Assistance N/A    Location Patient Served  [facility]    Encounter Setting CN site    Population Status Unknown    Insurance Medicaid    Insurance/Financial Assistance Referral N/A    Medication N/A    Medical Provider Yes    Screening Referrals Made N/A    Medical Referrals Made N/A    Medical Appointment Completed N/A    CNP Interventions Advocate/Support    Screenings CN Performed Blood Glucose    ED Visit Averted N/A    Life-Saving Intervention Made N/A          Patient given pack of crackers for feeling weak and felt as if blood sugar was low, however blood sugar was 141.  Patient said he needed to go home and eat a candy bar, Patient was educated on why it is not recommended for people with diabetes to eat candy bars due to being high in sugar which can cause rapid spike in blood glucose levels and can make it difficult to manage  diabetes and can increase the risk of complications.

## 2023-10-21 ENCOUNTER — Other Ambulatory Visit: Payer: Self-pay | Admitting: Nurse Practitioner

## 2023-10-22 NOTE — Telephone Encounter (Signed)
 Requested Prescriptions  Pending Prescriptions Disp Refills   fluticasone  furoate-vilanterol (BREO ELLIPTA ) 100-25 MCG/ACT AEPB [Pharmacy Med Name: BREO ELLIPTA  100-25 MCG/ACT INH AEP] 60 each 2    Sig: INHALE 1 PUFF BY MOUTH ONCE DAILY. RINSE MOUTH AFTER USE.     Pulmonology:  Combination Products Passed - 10/22/2023 11:37 AM      Passed - Valid encounter within last 12 months    Recent Outpatient Visits           5 months ago Uncontrolled diabetes mellitus with hyperglycemia, with long-term current use of insulin  [facility])   [facility] Valerio Melanie DASEN, NP       Future Appointments             In 2 weeks Fernand Denyse LABOR, MD [facility]

## 2023-10-28 ENCOUNTER — Telehealth: Payer: Self-pay | Admitting: Pharmacy Technician

## 2023-10-28 ENCOUNTER — Telehealth: Payer: Self-pay

## 2023-10-28 NOTE — Progress Notes (Signed)
 10/28/2023 Name: Jari M Kirschbaum Jr. MRN: [id number] DOB: 1960-04-23  Patient is appearing on a report for [employer] [place] Met[facility] Diabetes and last engaged with the clinical pharmacist to discuss diabetes on 08/24/2023 and 11/06/2022. Contacted patient today to discuss diabetes management and completed medication review.   Diabetes Plan from last clinical pharmacist appointment:  Diabetes: Current medications:   Jardiance  25mg  daily, Januvia  100mg  daily, Tresiba  45 units BID, Humalog  42 units TID before meals -Medications tried in the past: could not tolerate metformin  -Current glucose readings: FBG 173, 160 at lunchtime, 180 at supper -Using OneTOuch Ultra meter; testing 3 times daily -Patient reports hypoglycemic s/sx including dizziness, shakiness, sweating.  This occurred once, and patient's BG was 40- wife gave him peaches in juice to raise to normal level.  States he always eats after meal-time insulin . -Patient denies hyperglycemic symptoms including polyuria, polydipsia, polyphagia, nocturia, neuropathy, blurred vision. -Discussed CGM and benefit in alerting when BG trending low (or high).  Patient/wife state he has tried before, and it did not work well for him.  Prefers to continue daily fingersticks. -Patient would like more information on an insulin  pump and is seeing endocrinology next week.  Wife is currently giving insulin  injections, so there is concern with administration if she is ever not around -Dietary restrictions due to affordability and what they receive from food pantry program -Patient outreach tor scheduled telephone visit to complete LIS application for Medicare Extra Help  --Patient/wife do not have all needed information for LIS application -States father-in-law was assisting with applying for Medicaid and are not sure if he has been approved or not -Patient/wife to follow-up with Mr. Stingley father to see if Medicaid has been approved -I will also see if LCSW has  any additional information and follow-up with patient/wife next week     Medication Adherence Barriers Identified:  Patient made recommended medication changes per plan: Yes Patient has seen Endocrinology, Dr. Brendia since last pharmacy visit. Last visit with Endocrinology was 09/30/2023. Spoke to patient's wife this morning. HIPAA verified. Patient's wife informs patent takes Jardiance  25mg  daily, Humalog  8 units 3 times a day and Tresiba  45 units twice a day ( patient appears to be taking this differently than he 40 units twice a day that was discussed at Endocrinology office visit). Patient's wife informs he has these medications on han Access issues with any new medication or testing device: No Patient's wife informs he has medicaid and Medicare and cost is not of a concern for him at this time.Per Dr Annemarie, Jardiance  was filled on 10/21/23 for 90 day supply, NO fill data available for Tresiba  or Humalog . Dr Annemarie also shows patient filled Januvia  on 8/4 but patient's wife did not mention him taking this.  Patient is checking blood sugars as prescribed: Yes Patient's wife informs his sugar is in the 100 range and lower. Yesterday it was 50. She informs is is drinking More water and zero sugar soda. She informs he is doing much better with this as he used to drink a lot of sugary soda. She informs he has been trying really hard with his diet and doesn't really care for sweets. Last A!C on 09/30/2023 was 11.1. Patient's wife informs while in the hospital the doctor informs he needs to be seen by a lung doctor and a meurologist. She would like for him to see Dr. Dedra Sanders but is not sure about a Neurologist as he has been seeing a vein and vascular doctor named Dr.  Schnier but the hospital doctor says he needs to see a neurologist.  Medication Adherence Barriers Addressed/Actions Taken:  Reviewed medication changes per plan from last clinical pharmacist note Educated patient to  contact pharmacy regarding new prescriptions and refills  Reviewed instructions for monitoring blood sugars at home and reminded patient to keep a written log to review with pharmacist Reminded patient of date/time of upcoming clinical pharmacist follow up and any upcoming PCP/specialists visits. Patient denies transportation barriers to the appointment. Yes  Next clinical pharmacist appointment is scheduled for: TBD, sent request to care guide to schedule follow up with PharmD for 2-3 weeks.  Reegan Bouffard, CPhT [facility] Office: [phone] Email: [email or web address]

## 2023-11-05 ENCOUNTER — Ambulatory Visit (INDEPENDENT_AMBULATORY_CARE_PROVIDER_SITE_OTHER): Admitting: Cardiovascular Disease

## 2023-11-05 ENCOUNTER — Encounter: Payer: Self-pay | Admitting: Cardiovascular Disease

## 2023-11-05 VITALS — BP 120/80 | HR 57 | Ht 62.0 in | Wt 167.4 lb

## 2023-11-05 DIAGNOSIS — E1159 Type 2 diabetes mellitus with other circulatory complications: Secondary | ICD-10-CM | POA: Diagnosis not present

## 2023-11-05 DIAGNOSIS — I259 Chronic ischemic heart disease, unspecified: Secondary | ICD-10-CM

## 2023-11-05 DIAGNOSIS — I152 Hypertension secondary to endocrine disorders: Secondary | ICD-10-CM

## 2023-11-05 DIAGNOSIS — R55 Syncope and collapse: Secondary | ICD-10-CM | POA: Diagnosis not present

## 2023-11-05 DIAGNOSIS — E1169 Type 2 diabetes mellitus with other specified complication: Secondary | ICD-10-CM

## 2023-11-05 DIAGNOSIS — R0789 Other chest pain: Secondary | ICD-10-CM

## 2023-11-05 DIAGNOSIS — E785 Hyperlipidemia, unspecified: Secondary | ICD-10-CM | POA: Diagnosis not present

## 2023-11-05 NOTE — Progress Notes (Signed)
 Cardiology Office Note   Date:  11/05/2023   ID:  Alex CHRISTELLA Donnell Mickey., DOB 08/24/1960, MRN [id number]  PCP:  Valerio Melanie DASEN, NP  Cardiologist:  Denyse Bathe, MD      History of Present Illness: Alex Vanatta. is a 63 y.o. male who presents for  Chief Complaint  Patient presents with   Follow-up    3 month follow up    Has soreness in chest, worse with taking deep breath.      Past Medical History:  Diagnosis Date   Arrhythmia    Asthma    CCC (chronic calculous cholecystitis)    COPD (chronic obstructive pulmonary disease) (HCC)    Cyst of kidney, acquired    Diabetes mellitus without complication (HCC) 2013   type 2   Diverticulosis    Edentulous    Fatty liver    Gallbladder polyp    GERD (gastroesophageal reflux disease)    Heart murmur    History of chicken pox    History of measles as a child    History of PKU    Hyperlipidemia    Hypertension    IBS (irritable bowel syndrome)    Irregular heart beat    Mentally challenged    Pancreatitis    PONV (postoperative nausea and vomiting)    Stroke (HCC) 03/02/2019   vision issues - right eye/right sided weakness   Vision impairment    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     Past Surgical History:  Procedure Laterality Date   Cardiac Catherization     [facility]   CARDIAC CATHETERIZATION     [facility]   CATARACT EXTRACTION W/PHACO Left 06/05/2021   Procedure: CATARACT EXTRACTION PHACO AND INTRAOCULAR LENS PLACEMENT (IOC) LEFT DIABETIC 3.75 00:28.0;  Surgeon: Myrna Adine Anes, MD;  Location: [facility];  Service: Ophthalmology;  Laterality: Left;  Diabetic   CATARACT EXTRACTION W/PHACO Right 07/10/2021   Procedure: CATARACT EXTRACTION PHACO AND INTRAOCULAR LENS PLACEMENT (IOC) RIGHT DIABETIC;  Surgeon: Myrna Adine Anes, MD;  Location: [facility];  Service: Ophthalmology;  Laterality: Right;  Diabetic 2.66 00:24.4   COLONOSCOPY     COLONOSCOPY WITH PROPOFOL  N/A  08/10/2020   Procedure: COLONOSCOPY WITH PROPOFOL ;  Surgeon: Unk Corinn Skiff, MD;  Location: [facility] ENDOSCOPY;  Service: Gastroenterology;  Laterality: N/A;  Has ankle monitor; (Parole officer Ms Kenton needs a time before Monday [phone] - per patient's wife)   ESOPHAGOGASTRODUODENOSCOPY (EGD) WITH PROPOFOL  N/A 12/27/2016   Procedure: ESOPHAGOGASTRODUODENOSCOPY (EGD) WITH PROPOFOL ;  Surgeon: Unk Corinn Skiff, MD;  Location: [facility] ENDOSCOPY;  Service: Gastroenterology;  Laterality: N/A;   HEMORRHOID SURGERY     KNEE SURGERY     cyst removed   Ligament Removal Left    of left thumb: dr. Neill   ligament removal  of left thumb     Dr. Neill   NM GATED MYOCARDIAL STUDY (ARMX HX)  06/23/2014   Paraschos. Normal   WISDOM TOOTH EXTRACTION       Current Outpatient Medications  Medication Sig Dispense Refill   Accu-Chek Softclix Lancets lancets 1 each by Other route 5 (five) times daily. Use as instructed 200 each 12   acetaminophen  (TYLENOL ) 500 MG tablet Take 500 mg by mouth every 6 (six) hours as needed.     albuterol  (VENTOLIN  HFA) 108 (90 Base) MCG/ACT inhaler Inhale 2 puffs into the lungs every 6 (six) hours as needed. 18 g 0   aspirin  81 MG EC tablet  Take 1 tablet (81 mg total) by mouth daily. 180 tablet 0   Blood Glucose Monitoring Suppl (ACCU-CHEK AVIVA PLUS) w/Device KIT 1 each by Does not apply route 5 (five) times daily. 1 kit 0   clopidogrel  (PLAVIX ) 75 MG tablet Take 1 tablet (75 mg total) by mouth daily. 90 tablet 4   empagliflozin  (JARDIANCE ) 25 MG TABS tablet Take 1 tablet (25 mg total) by mouth daily. 90 tablet 4   EPINEPHrine  0.3 mg/0.3 mL IJ SOAJ injection INJECT 1 SYRINGE INTO OUTER THIGH ONCE AS NEEDED FOR SEVERE ALLERGIC REACTION. Strength 0.3 MG/0.3ML 2 each 1   erythromycin ophthalmic ointment Place 1 Application into both eyes at bedtime.     fenofibrate  54 MG tablet Take 1 tablet (54 mg total) by mouth daily. 90 tablet 4   fluticasone  furoate-vilanterol  (BREO ELLIPTA ) 100-25 MCG/ACT AEPB INHALE 1 PUFF BY MOUTH ONCE DAILY. RINSE MOUTH AFTER USE. 60 each 2   gabapentin  (NEURONTIN ) 300 MG capsule Take 1 capsule (300 mg total) by mouth 2 (two) times daily. 180 capsule 4   gatifloxacin (ZYMAXID) 0.5 % SOLN Place 1 drop into both eyes 4 (four) times daily.     glucose blood (ACCU-CHEK AVIVA PLUS) test strip 1 each by Other route 5 (five) times daily. Use as instructed 200 each 12   Insulin  Degludec (TRESIBA ) 100 UNIT/ML SOLN Inject 40 Units into the skin 2 (two) times daily. (Patient taking differently: Inject 45 Units into the skin 2 (two) times daily.) 30 mL 4   isosorbide  mononitrate (IMDUR ) 30 MG 24 hr tablet TAKE 1 TABLET BY MOUTH ONCE DAILY 30 tablet 1   lisinopril  (ZESTRIL ) 20 MG tablet Take 1 tablet (20 mg total) by mouth daily. 90 tablet 4   Misc. Devices (PULSE OXIMETER FOR FINGER) MISC To check O2 saturations once daily with asthma and document + check if any shortness of breath 1 each 1   montelukast  (SINGULAIR ) 10 MG tablet Take 1 tablet (10 mg total) by mouth at bedtime. 90 tablet 4   mupirocin  ointment (BACTROBAN ) 2 % Apply 1 Application topically 2 (two) times daily. 22 g 3   nitroGLYCERIN  (NITROSTAT ) 0.4 MG SL tablet Place 1 tablet (0.4 mg total) under the tongue every 5 (five) minutes as needed for chest pain. 50 tablet 3   pantoprazole  (PROTONIX ) 40 MG tablet TAKE 1 TABLET BY MOUTH ONCE DAILY 30 tablet 1   Resmetirom  (REZDIFFRA ) 60 MG TABS Take 1 tablet by mouth daily. 30 tablet 5   rosuvastatin  (CRESTOR ) 20 MG tablet Take 1 tablet (20 mg total) by mouth daily. 90 tablet 3   sitaGLIPtin  (JANUVIA ) 100 MG tablet Take 1 tablet (100 mg total) by mouth daily. 90 tablet 4   valACYclovir  (VALTREX ) 500 MG tablet Take 500 mg by mouth 3 (three) times daily.     Vitamin D , Ergocalciferol , (DRISDOL ) 1.25 MG (50000 UNIT) CAPS capsule Take 50,000 Units by mouth once a week. (Patient not taking: Reported on 11/05/2023)     No current  facility-administered medications for this visit.    Allergies:   Sulfa antibiotics, Bee venom, Morphine, and Other    Social History:   reports that he has never smoked. He has never been exposed to tobacco smoke. He has never used smokeless tobacco. He reports that he does not drink alcohol and does not use drugs.   Family History:  family history includes Cancer in his maternal grandfather, maternal grandmother, mother, and paternal grandfather; Diabetes in his father; Heart disease  in his father.    ROS:     Review of Systems  Constitutional: Negative.   HENT: Negative.    Eyes: Negative.   Respiratory: Negative.    Gastrointestinal: Negative.   Genitourinary: Negative.   Musculoskeletal: Negative.   Skin: Negative.   Neurological: Negative.   Endo/Heme/Allergies: Negative.   Psychiatric/Behavioral: Negative.    All other systems reviewed and are negative.     All other systems are reviewed and negative.    PHYSICAL EXAM: VS:  BP 120/80   Pulse (!) 57   Ht 5' 2 (1.575 m)   Wt 167 lb 6.4 oz (75.9 kg)   SpO2 98%   BMI 30.62 kg/m  , BMI Body mass index is 30.62 kg/m. Last weight:  Wt Readings from Last 3 Encounters:  11/05/23 167 lb 6.4 oz (75.9 kg)  10/04/23 171 lb 15.3 oz (78 kg)  09/22/23 172 lb (78 kg)     Physical Exam Vitals reviewed.  Constitutional:      Appearance: Normal appearance. He is normal weight.  HENT:     Head: Normocephalic.     Nose: Nose normal.     Mouth/Throat:     Mouth: Mucous membranes are moist.  Eyes:     Pupils: Pupils are equal, round, and reactive to light.  Cardiovascular:     Rate and Rhythm: Normal rate and regular rhythm.     Pulses: Normal pulses.     Heart sounds: Normal heart sounds.  Pulmonary:     Effort: Pulmonary effort is normal.  Abdominal:     General: Abdomen is flat. Bowel sounds are normal.  Musculoskeletal:        General: Normal range of motion.     Cervical back: Normal range of motion.   Skin:    General: Skin is warm.  Neurological:     General: No focal deficit present.     Mental Status: He is alert.  Psychiatric:        Mood and Affect: Mood normal.       EKG:   Recent Labs: 01/28/2023: TSH 1.040 09/24/2023: Magnesium  2.2 10/04/2023: ALT 67; BUN 8; Creatinine, Ser 0.86; Hemoglobin 13.1; Platelets 229; Potassium 4.1; Sodium 143    Lipid Panel    Component Value Date/Time   CHOL 165 05/07/2023 0851   CHOL 140 02/11/2023 1507   TRIG 369 (H) 05/07/2023 0851   TRIG 188 (H) 02/11/2023 1507   HDL 28 (L) 05/07/2023 0851   CHOLHDL 5.3 03/03/2019 0447   VLDL 44 (H) 03/03/2019 0447   VLDL 229 (H) 12/02/2018 1408   LDLCALC 78 05/07/2023 0851      Other studies Reviewed: Additional studies/ records that were reviewed today include:  Review of the above records demonstrates:       No data to display            ASSESSMENT AND PLAN:    ICD-10-CM   1. Other chest pain  R07.89 PCV ECHOCARDIOGRAM COMPLETE    MYOCARDIAL PERFUSION IMAGING   Has recurrent chest pains, work up in past unremarkable. Will set up echo and stress test.    2. Hypertension associated with diabetes (HCC)  E11.59 PCV ECHOCARDIOGRAM COMPLETE   I15.2 MYOCARDIAL PERFUSION IMAGING    3. Chest pain due to myocardial ischemia, unspecified ischemic chest pain type  I25.9 PCV ECHOCARDIOGRAM COMPLETE    MYOCARDIAL PERFUSION IMAGING    4. Syncope, unspecified syncope type  R55 PCV ECHOCARDIOGRAM COMPLETE    MYOCARDIAL  PERFUSION IMAGING    5. Hyperlipidemia associated with type 2 diabetes mellitus (HCC)  E11.69 PCV ECHOCARDIOGRAM COMPLETE   E78.5 MYOCARDIAL PERFUSION IMAGING       Problem List Items Addressed This Visit       Cardiovascular and Mediastinum   Hypertension associated with diabetes (HCC)   Relevant Orders   PCV ECHOCARDIOGRAM COMPLETE   MYOCARDIAL PERFUSION IMAGING     Endocrine   Hyperlipidemia associated with type 2 diabetes mellitus (HCC)   Relevant Orders    PCV ECHOCARDIOGRAM COMPLETE   MYOCARDIAL PERFUSION IMAGING   Other Visit Diagnoses       Other chest pain    -  Primary   Has recurrent chest pains, work up in past unremarkable. Will set up echo and stress test.   Relevant Orders   PCV ECHOCARDIOGRAM COMPLETE   MYOCARDIAL PERFUSION IMAGING     Chest pain due to myocardial ischemia, unspecified ischemic chest pain type       Relevant Orders   PCV ECHOCARDIOGRAM COMPLETE   MYOCARDIAL PERFUSION IMAGING     Syncope, unspecified syncope type       Relevant Orders   PCV ECHOCARDIOGRAM COMPLETE   MYOCARDIAL PERFUSION IMAGING          Disposition:   Return in about 4 weeks (around 12/03/2023) for echo, stress test and f/u.    Total time spent: 30 minutes  Signed,  Denyse Bathe, MD  11/05/2023 1:53 PM    [facility]

## 2023-11-15 ENCOUNTER — Other Ambulatory Visit: Payer: Self-pay

## 2023-11-15 DIAGNOSIS — E1165 Type 2 diabetes mellitus with hyperglycemia: Secondary | ICD-10-CM

## 2023-11-15 NOTE — Progress Notes (Addendum)
   11/15/2023 Name: Alex M Rademaker Jr. MRN: [id number] DOB: Sep 14, 1960  Subjective/Objective Patient outreach to follow-up on management of diabetes  Diabetes: Current medications:   Jardiance  25mg  daily, Tresiba  45 units BID, Humalog  8 units TID before meals -Medications tried in the past: could not tolerate metformin - patient was also taking Januvia  but states this was stopped at recent endocrinology visit (no mention of medication in endo note from 7/14 stating patient was taking or to stop) -A1c 11.5% on 7/6 -Using Accu Check guide to check BG up to 5x/daily -Discussed CGM and benefit in alerting when BG trending low (or high).  Patient/wife state he has tried before, and it did not work well for him.  Prefers to continue daily fingersticks. -BG's had improved at endocrinology visit last month (week prior to visit on 7/14 readings were 99-168); however BG during telephone visit this morning was 268.  Patient recently had a sausage biscuit for breakfast. -Patient denies any recent hypoglycemic or hyperglycemic symptoms  Hypertension:  Current medications:  lisinopril  20mg , isosorbide  [facility] 30mg  daily -Patient was previously checking home BP but monitor has quit working -Last OV BP was 120/80 on 8/19  -Fill history reflects good adherence to lisinopril , but isosorbide  [facility] does not appear to have been filled since April for a 60 day supply, and patient is not sure if they have been taking  Assessment/Plan  Diabetes -Uncontrolled -Continue current regimen at this time, but it is likely that postprandial insulin  will need to be increased based on BG reading after breakfast this morning -Continue to monitor FBG and post-prandial BG and record, so we can review at f/u -Sees PCP 9/8 and endocrinology again in October (will be due for A1c)  Hypertension -Controlled based on last OV BP reading, which was with cardiology last week -Submitted an order for home BP monitor via [employer] to  see if patient's insurance will cover; if not, I should be able to provide one since affordability is a barrier for patient -Coordinating with cardiology to verify patient should still be taking isosorbide  and request refill to be sent in if so  Follow-up: 1 month  [name], PharmD, DPLA

## 2023-11-21 ENCOUNTER — Encounter

## 2023-11-22 ENCOUNTER — Other Ambulatory Visit: Payer: Self-pay

## 2023-11-22 DIAGNOSIS — E1169 Type 2 diabetes mellitus with other specified complication: Secondary | ICD-10-CM

## 2023-11-22 DIAGNOSIS — I259 Chronic ischemic heart disease, unspecified: Secondary | ICD-10-CM

## 2023-11-22 DIAGNOSIS — E1159 Type 2 diabetes mellitus with other circulatory complications: Secondary | ICD-10-CM

## 2023-11-22 DIAGNOSIS — E1165 Type 2 diabetes mellitus with hyperglycemia: Secondary | ICD-10-CM

## 2023-11-22 DIAGNOSIS — R0789 Other chest pain: Secondary | ICD-10-CM

## 2023-11-22 DIAGNOSIS — K219 Gastro-esophageal reflux disease without esophagitis: Secondary | ICD-10-CM

## 2023-11-22 DIAGNOSIS — I6523 Occlusion and stenosis of bilateral carotid arteries: Secondary | ICD-10-CM

## 2023-11-22 MED ORDER — ISOSORBIDE MONONITRATE ER 30 MG PO TB24: 30.0000 mg | ORAL_TABLET | Freq: Every day | ORAL | 0 refills | Status: AC

## 2023-11-22 NOTE — Progress Notes (Signed)
 Rx sent.

## 2023-11-23 NOTE — Patient Instructions (Incomplete)

## 2023-11-25 ENCOUNTER — Encounter: Payer: Self-pay | Admitting: Nurse Practitioner

## 2023-11-25 ENCOUNTER — Ambulatory Visit (INDEPENDENT_AMBULATORY_CARE_PROVIDER_SITE_OTHER): Admitting: Nurse Practitioner

## 2023-11-25 ENCOUNTER — Telehealth: Payer: Self-pay

## 2023-11-25 VITALS — BP 137/76 | HR 77 | Temp 98.9°F | Resp 15 | Ht 62.01 in | Wt 167.0 lb

## 2023-11-25 DIAGNOSIS — J4540 Moderate persistent asthma, uncomplicated: Secondary | ICD-10-CM

## 2023-11-25 DIAGNOSIS — E1165 Type 2 diabetes mellitus with hyperglycemia: Secondary | ICD-10-CM | POA: Diagnosis not present

## 2023-11-25 DIAGNOSIS — E1169 Type 2 diabetes mellitus with other specified complication: Secondary | ICD-10-CM

## 2023-11-25 DIAGNOSIS — K219 Gastro-esophageal reflux disease without esophagitis: Secondary | ICD-10-CM

## 2023-11-25 DIAGNOSIS — I152 Hypertension secondary to endocrine disorders: Secondary | ICD-10-CM | POA: Diagnosis not present

## 2023-11-25 DIAGNOSIS — E559 Vitamin D deficiency, unspecified: Secondary | ICD-10-CM | POA: Diagnosis not present

## 2023-11-25 DIAGNOSIS — E538 Deficiency of other specified B group vitamins: Secondary | ICD-10-CM | POA: Diagnosis not present

## 2023-11-25 DIAGNOSIS — M25512 Pain in left shoulder: Secondary | ICD-10-CM

## 2023-11-25 DIAGNOSIS — L03811 Cellulitis of head [any part, except face]: Secondary | ICD-10-CM | POA: Diagnosis not present

## 2023-11-25 DIAGNOSIS — E1159 Type 2 diabetes mellitus with other circulatory complications: Secondary | ICD-10-CM

## 2023-11-25 DIAGNOSIS — E701 Other hyperphenylalaninemias: Secondary | ICD-10-CM

## 2023-11-25 DIAGNOSIS — G8929 Other chronic pain: Secondary | ICD-10-CM | POA: Insufficient documentation

## 2023-11-25 DIAGNOSIS — Z23 Encounter for immunization: Secondary | ICD-10-CM | POA: Diagnosis not present

## 2023-11-25 DIAGNOSIS — I209 Angina pectoris, unspecified: Secondary | ICD-10-CM

## 2023-11-25 DIAGNOSIS — K760 Fatty (change of) liver, not elsewhere classified: Secondary | ICD-10-CM

## 2023-11-25 DIAGNOSIS — Z794 Long term (current) use of insulin: Secondary | ICD-10-CM | POA: Diagnosis not present

## 2023-11-25 DIAGNOSIS — E785 Hyperlipidemia, unspecified: Secondary | ICD-10-CM | POA: Diagnosis not present

## 2023-11-25 DIAGNOSIS — E1129 Type 2 diabetes mellitus with other diabetic kidney complication: Secondary | ICD-10-CM | POA: Diagnosis not present

## 2023-11-25 MED ORDER — ASPIRIN 81 MG PO TBEC: 81.0000 mg | DELAYED_RELEASE_TABLET | Freq: Every day | ORAL | 4 refills | Status: AC

## 2023-11-25 MED ORDER — DOXYCYCLINE HYCLATE 100 MG PO TABS: 100.0000 mg | ORAL_TABLET | Freq: Two times a day (BID) | ORAL | 0 refills | Status: AC

## 2023-11-25 MED ORDER — INSULIN DEGLUDEC 100 UNIT/ML ~~LOC~~ SOLN: 45.0000 [IU] | Freq: Two times a day (BID) | SUBCUTANEOUS | 4 refills | Status: DC

## 2023-11-25 MED ORDER — MUPIROCIN 2 % EX OINT: 1.0000 | TOPICAL_OINTMENT | Freq: Two times a day (BID) | CUTANEOUS | 3 refills | Status: AC

## 2023-11-25 MED ORDER — ALBUTEROL SULFATE HFA 108 (90 BASE) MCG/ACT IN AERS: 2.0000 | INHALATION_SPRAY | Freq: Four times a day (QID) | RESPIRATORY_TRACT | 4 refills | Status: AC | PRN

## 2023-11-25 NOTE — Progress Notes (Signed)
 BP 137/76 (BP Location: Left Arm, Patient Position: Sitting, Cuff Size: Normal)   Pulse 77   Temp 98.9 F (37.2 C) (Oral)   Resp 15   Ht 5' 2.01 (1.575 m)   Wt 167 lb (75.8 kg)   SpO2 96%   BMI 30.54 kg/m    Subjective:    Patient ID: Marsha CHRISTELLA Donnell Mickey., male    DOB: 07/01/1960, 63 y.o.   MRN: [id number]  HPI: Azaan Leask. is a 63 y.o. male  Chief Complaint  Patient presents with   Follow-up   Arm Pain    Start in wrist and moves into the shoulder. Left sided. Ongoing for months.   Diabetic Eye Exam    Requested records from Dr. Mevelyn.    Hypertension   Hyperlipidemia   Asthma   Wife not at bedside today and she is in charge of all his medications and prepping at home.  Unsure if consistently taking all medications daily.  Reports shoulder pain left side and to wrist.  On and off for months. Not taking anything for pain.  He is left hand dominant.    DIABETES & PHENYLKETONURIA July 11.1% A1c. Following with endocrinology at [facility], last endo visit 09/30/23.  Returns to see them 01/06/24.  Takes Jardiance , Januvia , Tresiba , and Humalog  with sliding scale. 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  Has been cutting back on soda and drinking flavored water.  HISTORY: Has tried Metformin  in past x 2 both times AKI presented, history of pancreatitis and can not take GLP1.  Hypoglycemic episodes:no Polydipsia/polyuria: no Visual disturbance: no Chest pain: no Paresthesias: no Glucose Monitoring: yes  Accucheck frequency: TID -- 100 to 130 range he reports  Fasting glucose:   Post prandial:   Evening:  Before meals: Taking Insulin ?: yes  Long acting insulin : Tresiba  45 units BID  Short acting insulin : 8 units before meals Blood Pressure Monitoring: not checking Retinal Examination: Up to Date -- [place] one month ago seen Foot Exam: Up to Date Pneumovax: Up to Date Influenza: Up to Date Aspirin : yes   HYPERTENSION /  HYPERLIPIDEMIA Takes Lisinopril , Rosuvastatin , Fenofibrate , Isosorbide , Vascepa , NTG.  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 Last saw neurology, Dr. Lane, on 11/09/19. He is to continue Plavix  and ASA per their note. Last visit with vascular was 10/08/22.  Saw cardiology on 11/05/23, will be going for stress test and echo upcoming due to his chest pain and SOB with activity.  These are schedule upcoming. Satisfied with current treatment? yes Duration of hypertension: chronic BP monitoring frequency: not checking BP range:  BP medication side effects: no Duration of hyperlipidemia: chronic Cholesterol medication side effects: no Cholesterol supplements: none Medication compliance: good compliance Aspirin : yes Recent stressors: no Recurrent headaches: no Visual changes: no Palpitations: no Dyspnea: with activity, following with cardiology Chest pain: yes occasionally with activity, following with cardiology Lower extremity edema: no Dizzy/lightheaded: no  ASTHMA Continues to use Breo daily and Singulair .  Has Albuterol  to use as needed. Asthma status: stable Satisfied with current treatment?: yes Albuterol /rescue inhaler frequency: none Dyspnea frequency: as above Wheezing frequency: on occasion Cough frequency: no Nocturnal symptom frequency: none Limitation of activity: no Current upper respiratory symptoms: no Aerochamber/spacer use: no Visits to ER or Urgent Care in past year: no Pneumovax: Up to Date Influenza: Up to Date  NAFLD Following with GI, last visit was 02/11/23.  Currently treated with Rezdiffra . On review fibrosis Stage II. To be taking Vitamin D  supplement  but is not taking this or B12 as instructed. Fever: no Nausea: no Vomiting: no Weight loss: no Decreased appetite: no Diarrhea: no Constipation: no Blood in stool: no Heartburn: no Jaundice: no Rash: no  Relevant past medical, surgical, family and social history reviewed and updated as indicated.  Interim medical history since our last visit reviewed. Allergies and medications reviewed and updated.  Review of Systems  Constitutional:  Negative for activity change, diaphoresis, fatigue and fever.  Respiratory:  Negative for cough, chest tightness, shortness of breath and wheezing.   Cardiovascular:  Negative for chest pain, palpitations and leg swelling.  Gastrointestinal: Negative.   Neurological: Negative.   Psychiatric/Behavioral: Negative.     Per HPI unless specifically indicated above     Objective:    BP 137/76 (BP Location: Left Arm, Patient Position: Sitting, Cuff Size: Normal)   Pulse 77   Temp 98.9 F (37.2 C) (Oral)   Resp 15   Ht 5' 2.01 (1.575 m)   Wt 167 lb (75.8 kg)   SpO2 96%   BMI 30.54 kg/m   Wt Readings from Last 3 Encounters:  11/25/23 167 lb (75.8 kg)  11/05/23 167 lb 6.4 oz (75.9 kg)  10/04/23 171 lb 15.3 oz (78 kg)    Physical Exam Vitals and nursing note reviewed.  Constitutional:      General: He is awake. He is not in acute distress.    Appearance: He is well-developed and well-groomed. He is obese. He is not ill-appearing or toxic-appearing.  HENT:     Head: Normocephalic.     Right Ear: Hearing and external ear normal.     Left Ear: Hearing and external ear normal.  Eyes:     General: Lids are normal.     Extraocular Movements: Extraocular movements intact.     Conjunctiva/sclera: Conjunctivae normal.  Neck:     Thyroid : No thyromegaly.     Vascular: No carotid bruit.  Cardiovascular:     Rate and Rhythm: Normal rate and regular rhythm.     Heart sounds: Normal heart sounds. No murmur heard.    No gallop.  Pulmonary:     Effort: No accessory muscle usage or respiratory distress.     Breath sounds: Normal breath sounds.  Abdominal:     General: Bowel sounds are normal. There is no distension.     Palpations: Abdomen is soft.     Tenderness: There is no abdominal tenderness.  Musculoskeletal:     Cervical back: Full passive  range of motion without pain.     Right lower leg: No edema.     Left lower leg: No edema.  Lymphadenopathy:     Cervical: No cervical adenopathy.  Skin:    General: Skin is warm.     Capillary Refill: Capillary refill takes less than 2 seconds.  Neurological:     Mental Status: He is alert and oriented to person, place, and time.     Deep Tendon Reflexes: Reflexes are normal and symmetric.     Reflex Scores:      Brachioradialis reflexes are 2+ on the right side and 2+ on the left side.      Patellar reflexes are 2+ on the right side and 2+ on the left side. Psychiatric:        Attention and Perception: Attention normal.        Mood and Affect: Mood normal.        Speech: Speech normal.  Behavior: Behavior normal. Behavior is cooperative.        Thought Content: Thought content normal.    Results for orders placed or performed in visit on 10/17/23  POCT glucose (manual entry)   Collection Time: 10/17/23  9:15 AM  Result Value Ref Range   POC Glucose 141 (A) 70 - 99 mg/dl      Assessment & Plan:   Problem List Items Addressed This Visit       Cardiovascular and Mediastinum   Hypertension associated with diabetes (HCC)   Chronic, stable.  BP at goal today.  Will continue Lisinopril  10 MG dosing, further adjust next visit if remains on mildly elevated, had reduced in past due to occasional dizziness and low readings.  Labs: CBC, TSH, CMP.  Urine ALB 29 April 2023.  Recommend they check his BP at home at least daily and document + focus on DASH diet.  Continue to collaborate with cardiology.       Relevant Medications   aspirin  EC 81 MG tablet   Insulin  Degludec (TRESIBA ) 100 UNIT/ML SOLN   Other Relevant Orders   TSH   Comprehensive metabolic panel with GFR     Respiratory   Asthma   Chronic, ongoing.  FEV1 70% on check in 2021.  Tolerating Breo, to continue to use daily.  Recommend using Albuterol  as needed only if episodes of SOB present. Return in 3 months.  Referral to pulmonary due to SOB, ?cardiac vs pulmonary related.  His wife sees Dr. Tamea and he would like to see her.  Discussed with him that oxygen may not be recommended and this is dependent on testing, his wife wears it and he thinks he needs to as well.      Relevant Medications   albuterol  (VENTOLIN  HFA) 108 (90 Base) MCG/ACT inhaler   Other Relevant Orders   Ambulatory referral to Pulmonology     Digestive   NAFLD (nonalcoholic fatty liver disease)   Ongoing.  Continue collaboration with GI provider as needed.  Labs up to date.      Relevant Orders   Comprehensive metabolic panel with GFR   GERD (gastroesophageal reflux disease)   Chronic, ongoing.  Continue Protonix  daily and adjust as needed.  Mag level annually.  Risks of PPI use were discussed with patient including bone loss, C. Diff diarrhea, pneumonia, infections, CKD, electrolyte abnormalities.  Verbalizes understanding and chooses to continue the medication.       Relevant Orders   Magnesium      Endocrine   Uncontrolled diabetes mellitus with hyperglycemia, with long-term current use of insulin  (HCC) - Primary   Chronic, ongoing.  A1c 11.1% with endo in July.  Urine ALB 29 April 2023, continue Lisinopril . - Continue all current medications as ordered by endocrinology. - Poor tolerance to Metformin  in past with AKI presenting x 2 trials and allergic to Sulfa. Would avoid GLP at this time due to patient history of pancreatitis x 3, concern this would flare. Educated patient at length on effect of diabetes from head to toe and increased risk for recurrent CVA due to poor control.   - Recommend they check his BS TID -- he would benefit from [facility] in future.  Has been working with pharmacist, SW, and nurse case manager as needed. - Goal A1C <6.5% due to CVA history -- which reiterated at length with patient today.  Discussed at length what an A1c at his current level could cause including wounds, MI, stroke,  worsening liver  disease and possibly death. - Foot and eye exam up to date. - Vaccinations up to date      Relevant Medications   aspirin  EC 81 MG tablet   Insulin  Degludec (TRESIBA ) 100 UNIT/ML SOLN   Other Relevant Orders   Comprehensive metabolic panel with GFR   Type 2 diabetes mellitus with proteinuria (HCC)   Chronic, ongoing.  A1c 11.1% with endo in July.  Urine ALB 29 April 2023, continue Lisinopril . - Continue all current medications as ordered by endocrinology. - Poor tolerance to Metformin  in past with AKI presenting x 2 trials and allergic to Sulfa. Would avoid GLP at this time due to patient history of pancreatitis x 3, concern this would flare. Educated patient at length on effect of diabetes from head to toe and increased risk for recurrent CVA due to poor control.   - Recommend they check his BS TID -- he would benefit from [facility] in future.  Has been working with pharmacist, SW, and nurse case manager as needed. - Goal A1C <6.5% due to CVA history -- which reiterated at length with patient today.  Discussed at length what an A1c at his current level could cause including wounds, MI, stroke, worsening liver disease and possibly death. - Foot and eye exam up to date. - Vaccinations up to date      Relevant Medications   aspirin  EC 81 MG tablet   Insulin  Degludec (TRESIBA ) 100 UNIT/ML SOLN   Hyperlipidemia associated with type 2 diabetes mellitus (HCC)   Chronic, ongoing.  Continue Lipitor, Vascepa , and Fenofibrate . Lipid panel today.  Adjust doses as needed, if LDL >70.  Unsure he is taking medications consistently.      Relevant Medications   aspirin  EC 81 MG tablet   Insulin  Degludec (TRESIBA ) 100 UNIT/ML SOLN   Other Relevant Orders   Lipid Panel w/o Chol/HDL Ratio   Comprehensive metabolic panel with GFR     Musculoskeletal and Integument   Cellulitis of head or scalp   Noted on exam today, he picks at any scabs that present on body.  HIGHLY advised  him not to pick at any scabs and discussed with him doing so can cause bacteria to enter wounds and in turn cause cellulitis.  Start Doxycyline BID for 7 days and use Mupirocin  to wound + wound cleanser daily. To return if no improvement or worsening.      Relevant Medications   mupirocin  ointment (BACTROBAN ) 2 %     Other   Vitamin D  deficiency   Ongoing, stable.  Recommend he start taking supplement and recheck level today.      Relevant Orders   VITAMIN D  25 Hydroxy (Vit-D Deficiency, Fractures)   Chronic left shoulder pain   Chronic, ongoing.  He would like to return to ortho, who has seen in past for this. Referral placed and recommend he take OTC medications and creams as needed only.      Relevant Medications   aspirin  EC 81 MG tablet   Other Relevant Orders   Ambulatory referral to Orthopedic Surgery   B12 deficiency   History of low levels, check today and restart supplement as needed.      Relevant Orders   CBC with Differential/Platelet   Vitamin B12   Other Visit Diagnoses       Pneumococcal vaccination given       PCV20 in office today, educated on this.   Relevant Orders   Pneumococcal conjugate vaccine 20-valent (Completed)  Flu vaccine need       Flu vaccine in office today, educated on this.   Relevant Orders   Flu vaccine trivalent PF, 6mos and older(Flulaval,Afluria,Fluarix,Fluzone) (Completed)        Follow up plan: Return in about 3 months (around 02/24/2024) for T2DM, HTN/HLD, ASTHMA, NAFLD.

## 2023-11-25 NOTE — Assessment & Plan Note (Signed)
 Ongoing, stable.  Recommend he start taking supplement and recheck level today.

## 2023-11-25 NOTE — Assessment & Plan Note (Signed)
 Chronic, ongoing.  He would like to return to ortho, who has seen in past for this. Referral placed and recommend he take OTC medications and creams as needed only.

## 2023-11-25 NOTE — Assessment & Plan Note (Signed)
 Chronic, ongoing.  A1c 11.1% with endo in July.  Urine ALB 29 April 2023, continue Lisinopril . - Continue all current medications as ordered by endocrinology. - Poor tolerance to Metformin  in past with AKI presenting x 2 trials and allergic to Sulfa. Would avoid GLP at this time due to patient history of pancreatitis x 3, concern this would flare. Educated patient at length on effect of diabetes from head to toe and increased risk for recurrent CVA due to poor control.   - Recommend they check his BS TID -- he would benefit from [facility] in future.  Has been working with pharmacist, SW, and nurse case manager as needed. - Goal A1C <6.5% due to CVA history -- which reiterated at length with patient today.  Discussed at length what an A1c at his current level could cause including wounds, MI, stroke, worsening liver disease and possibly death. - Foot and eye exam up to date. - Vaccinations up to date

## 2023-11-25 NOTE — Assessment & Plan Note (Signed)
 Noted on exam today, he picks at any scabs that present on body.  HIGHLY advised him not to pick at any scabs and discussed with him doing so can cause bacteria to enter wounds and in turn cause cellulitis.  Start Doxycyline BID for 7 days and use Mupirocin  to wound + wound cleanser daily. To return if no improvement or worsening.

## 2023-11-25 NOTE — Assessment & Plan Note (Signed)
 Chronic, ongoing.  FEV1 70% on check in 2021.  Tolerating Breo, to continue to use daily.  Recommend using Albuterol  as needed only if episodes of SOB present. Return in 3 months. Referral to pulmonary due to SOB, ?cardiac vs pulmonary related.  His wife sees Dr. Tamea and he would like to see her.  Discussed with him that oxygen may not be recommended and this is dependent on testing, his wife wears it and he thinks he needs to as well.

## 2023-11-25 NOTE — Assessment & Plan Note (Addendum)
 Chronic, ongoing.  Continue Protonix  daily and adjust as needed.  Mag level annually.  Risks of PPI use were discussed with patient including bone loss, C. Diff diarrhea, pneumonia, infections, CKD, electrolyte abnormalities.  Verbalizes understanding and chooses to continue the medication.

## 2023-11-25 NOTE — Assessment & Plan Note (Addendum)
 Chronic, ongoing.  Continue Lipitor, Vascepa , and Fenofibrate . Lipid panel today.  Adjust doses as needed, if LDL >70.  Unsure he is taking medications consistently.

## 2023-11-25 NOTE — Progress Notes (Signed)
   11/25/2023  Patient ID: [name]., male   DOB: 1960/05/10, 63 y.o.   MRN: [id number]  Insurance will not cover home BP monitor, so I will supply one to patient under grant.  Patient sees PCP again this afternoon at 2pm, so I am coordinating with office staff to see if one can be provided today from supply at [facility].    [name], PharmD, DPLA   .

## 2023-11-25 NOTE — Assessment & Plan Note (Signed)
 Chronic, stable.  BP at goal today.  Will continue Lisinopril  10 MG dosing, further adjust next visit if remains on mildly elevated, had reduced in past due to occasional dizziness and low readings.  Labs: CBC, TSH, CMP.  Urine ALB 29 April 2023.  Recommend they check his BP at home at least daily and document + focus on DASH diet.  Continue to collaborate with cardiology.

## 2023-11-25 NOTE — Assessment & Plan Note (Signed)
 History of low levels, check today and restart supplement as needed.

## 2023-11-25 NOTE — Assessment & Plan Note (Signed)
 Ongoing.  Continue collaboration with GI provider as needed.  Labs up to date.

## 2023-11-26 ENCOUNTER — Telehealth: Payer: Self-pay

## 2023-11-26 ENCOUNTER — Ambulatory Visit: Payer: Self-pay | Admitting: Nurse Practitioner

## 2023-11-26 DIAGNOSIS — E1169 Type 2 diabetes mellitus with other specified complication: Secondary | ICD-10-CM

## 2023-11-26 LAB — CBC WITH DIFFERENTIAL/PLATELET
Basophils Absolute: 0 x10E3/uL (ref 0.0–0.2)
Basos: 1 %
EOS (ABSOLUTE): 0.1 x10E3/uL (ref 0.0–0.4)
Eos: 2 %
Hematocrit: 43.3 % (ref 37.5–51.0)
Hemoglobin: 14.1 g/dL (ref 13.0–17.7)
Immature Grans (Abs): 0 x10E3/uL (ref 0.0–0.1)
Immature Granulocytes: 0 %
Lymphocytes Absolute: 1.1 x10E3/uL (ref 0.7–3.1)
Lymphs: 24 %
MCH: 31.5 pg (ref 26.6–33.0)
MCHC: 32.6 g/dL (ref 31.5–35.7)
MCV: 97 fL (ref 79–97)
Monocytes Absolute: 0.4 x10E3/uL (ref 0.1–0.9)
Monocytes: 8 %
Neutrophils Absolute: 3 x10E3/uL (ref 1.4–7.0)
Neutrophils: 65 %
Platelets: 156 x10E3/uL (ref 150–450)
RBC: 4.47 x10E6/uL (ref 4.14–5.80)
RDW: 12.4 % (ref 11.6–15.4)
WBC: 4.6 x10E3/uL (ref 3.4–10.8)

## 2023-11-26 LAB — COMPREHENSIVE METABOLIC PANEL WITH GFR
ALT: 25 IU/L (ref 0–44)
AST: 22 IU/L (ref 0–40)
Albumin: 4.3 g/dL (ref 3.8–4.9)
Alkaline Phosphatase: 210 IU/L — ABNORMAL HIGH (ref 44–121)
BUN/Creatinine Ratio: 22 (ref 10–24)
BUN: 22 mg/dL (ref 8–27)
Bilirubin Total: <0.2 mg/dL (ref 0.0–1.2)
CO2: 16 mmol/L — ABNORMAL LOW (ref 20–29)
Calcium: 8.9 mg/dL (ref 8.6–10.2)
Chloride: 103 mmol/L (ref 96–106)
Creatinine, Ser: 0.98 mg/dL (ref 0.76–1.27)
Globulin, Total: 2.3 g/dL (ref 1.5–4.5)
Glucose: 393 mg/dL — ABNORMAL HIGH (ref 70–99)
Potassium: 4.3 mmol/L (ref 3.5–5.2)
Sodium: 135 mmol/L (ref 134–144)
Total Protein: 6.6 g/dL (ref 6.0–8.5)
eGFR: 88 mL/min/1.73 (ref >59)

## 2023-11-26 LAB — LIPID PANEL W/O CHOL/HDL RATIO
Cholesterol, Total: 225 mg/dL — ABNORMAL HIGH (ref 100–199)
HDL: 21 mg/dL — ABNORMAL LOW (ref >39)
Triglycerides: 1194 mg/dL (ref 0–149)

## 2023-11-26 LAB — VITAMIN B12: Vitamin B-12: 268 pg/mL (ref 232–1245)

## 2023-11-26 LAB — VITAMIN D 25 HYDROXY (VIT D DEFICIENCY, FRACTURES): Vit D, 25-Hydroxy: 12.8 ng/mL — ABNORMAL LOW (ref 30.0–100.0)

## 2023-11-26 LAB — TSH: TSH: 0.986 u[IU]/mL (ref 0.450–4.500)

## 2023-11-26 LAB — MAGNESIUM: Magnesium: 2 mg/dL (ref 1.6–2.3)

## 2023-11-26 MED ORDER — VITAMIN B-12 1000 MCG PO TABS: 1000.0000 ug | ORAL_TABLET | Freq: Every day | ORAL | 3 refills | Status: AC

## 2023-11-26 MED ORDER — VITAMIN D (ERGOCALCIFEROL) 1.25 MG (50000 UNIT) PO CAPS: 50000.0000 [IU] | ORAL_CAPSULE | ORAL | 3 refills | Status: DC

## 2023-11-26 NOTE — Progress Notes (Signed)
   11/26/2023  Patient ID: [name]., male   DOB: [birth year], 63 y.o.   MRN: [id number]  Patient outreach to inform Nachum and Madelin (wife/DPR) that insurance will not cover a home BP monitor, so I will be able to provide one to them since affordability is an issue.  Patient will come see me at [facility] tomorrow morning (9/10) at 1030am.  [name], PharmD, DPLA

## 2023-11-26 NOTE — Progress Notes (Signed)
 Scheduled

## 2023-11-26 NOTE — Progress Notes (Signed)
 Good morning, please let Derrick know his labs have returned + needs lab only visit for later this week: - Kidney function, creatinine and eGFR, remains normal, as is liver function, AST and ALT.  - Sugar is elevated at 393, he needs to ensure he is taking all diabetes medications as ordered and not missing doses + ensure to follow-up as scheduled with diabetes provider. - Vitamin D  level remains low, he needs to ensure he is taking supplement I have ordered in past.  I will send in refills today. - B12 level also low, I will send in refills on this and he needs to take as ordered. - Big concern is triglycerides are elevated again, I would like him to come in fasting for labs later this week to recheck as if ongoing elevated in lipid panel levels we will switch to an injection he will take to help lower these and lower his risk for recurrence of stroke.  Any questions? Keep being amazing!!  Thank you for allowing me to participate in your care.  I appreciate you. Kindest regards, Evalie Hargraves

## 2023-11-26 NOTE — Progress Notes (Signed)
 Called patient and left a message for him to call back to get scheduled for fasting labs later this week.

## 2023-11-27 ENCOUNTER — Other Ambulatory Visit: Payer: Self-pay

## 2023-11-27 ENCOUNTER — Other Ambulatory Visit (INDEPENDENT_AMBULATORY_CARE_PROVIDER_SITE_OTHER): Payer: Self-pay | Admitting: Vascular Surgery

## 2023-11-27 DIAGNOSIS — I6523 Occlusion and stenosis of bilateral carotid arteries: Secondary | ICD-10-CM

## 2023-11-27 NOTE — Progress Notes (Signed)
   11/27/2023 Name: Alex M Balandran Jr. MRN: [id number] DOB: 1960-10-11  Subjective/Objective Patient presented to [facility] for follow-up on blood pressure control  Hypertension:  Current medications:  lisinopril  20mg , isosorbide  [place] [facility] 30mg  daily -Patient was previously checking home BP but monitor has quit working, and insurance will not cover a new one -Last OV BP was 137/76 on 9/8 -Fill history reflects good adherence to lisinopril , and isosorbide  was recently refilled by Dr. Fernand- patient endorses he has resumed taking this medication daily  Assessment/Plan  Hypertension -Moderately controlled -Continue current regimen at this time -Blood pressure monitor has been provided to the patient.  I have counseled on use and proper BP monitoring technique.  Advised patient to check home BP daily approximately 30 minutes after taking morning medications.  -The patient has a diagnosis of hypertension and it is medically necessary for them to have access to a home device to monitor blood pressure.  The patient does not have readily available insurance access to a device and cannot afford to purchase a device at this time.  The patient has been counseled that they do not need to continue to receive services from [facility] to receive a device.  The patient will be given a device free of charge.  Follow-up: 2 weeks to follow-up on control of blood pressure and diabetes  [name], PharmD, DPLA

## 2023-11-29 ENCOUNTER — Other Ambulatory Visit

## 2023-11-29 ENCOUNTER — Telehealth: Payer: Self-pay

## 2023-11-29 DIAGNOSIS — E1169 Type 2 diabetes mellitus with other specified complication: Secondary | ICD-10-CM

## 2023-11-29 DIAGNOSIS — E785 Hyperlipidemia, unspecified: Secondary | ICD-10-CM | POA: Diagnosis not present

## 2023-11-29 MED ORDER — INSULIN DEGLUDEC 100 UNIT/ML ~~LOC~~ SOPN: 45.0000 [IU] | PEN_INJECTOR | Freq: Two times a day (BID) | SUBCUTANEOUS | 3 refills | Status: AC

## 2023-11-29 NOTE — Telephone Encounter (Signed)
 Wife states they received the vial however he is unable to use and needs the pen. Advised that she return what he was given to the pharmacy so that when they receive the corrected prescription they can go ahead and fill without hold up from pharmacy for filling to soon.

## 2023-11-29 NOTE — Telephone Encounter (Signed)
 Copied from CRM [id number]. Topic: Clinical - Medication Question >> Nov 29, 2023  2:12 PM Teressa P wrote: Reason for CRM: Pt's wife called saying the Tresiba  that was sent in is not the one he usually gets.  They do not know how to use the pen that was sent in.  CB#  [phone]

## 2023-11-30 ENCOUNTER — Ambulatory Visit: Payer: Self-pay | Admitting: Nurse Practitioner

## 2023-11-30 LAB — LIPID PANEL W/O CHOL/HDL RATIO
Cholesterol, Total: 138 mg/dL (ref 100–199)
HDL: 30 mg/dL — ABNORMAL LOW (ref >39)
LDL Chol Calc (NIH): 81 mg/dL (ref 0–99)
Triglycerides: 153 mg/dL — ABNORMAL HIGH (ref 0–149)
VLDL Cholesterol Cal: 27 mg/dL (ref 5–40)

## 2023-11-30 MED ORDER — ROSUVASTATIN CALCIUM 40 MG PO TABS: 40.0000 mg | ORAL_TABLET | Freq: Every day | ORAL | 3 refills | Status: AC

## 2023-11-30 NOTE — Progress Notes (Signed)
 Good morning, please let [name] know his labs have returned and lipid panel is showing some improvement however still not at goal.  I am increasing your Rosuvastatin  to 40 MG daily and you will stop 20 MG daily.  Then we will recheck levels next visit with you fasting.  Any questions?

## 2023-12-02 ENCOUNTER — Ambulatory Visit (INDEPENDENT_AMBULATORY_CARE_PROVIDER_SITE_OTHER)

## 2023-12-02 ENCOUNTER — Encounter (INDEPENDENT_AMBULATORY_CARE_PROVIDER_SITE_OTHER): Payer: Self-pay | Admitting: Vascular Surgery

## 2023-12-02 ENCOUNTER — Ambulatory Visit (INDEPENDENT_AMBULATORY_CARE_PROVIDER_SITE_OTHER): Admitting: Vascular Surgery

## 2023-12-02 VITALS — BP 159/92 | HR 59 | Ht 62.0 in | Wt 163.4 lb

## 2023-12-02 DIAGNOSIS — I6523 Occlusion and stenosis of bilateral carotid arteries: Secondary | ICD-10-CM

## 2023-12-02 DIAGNOSIS — I2583 Coronary atherosclerosis due to lipid rich plaque: Secondary | ICD-10-CM

## 2023-12-02 DIAGNOSIS — I2510 Atherosclerotic heart disease of native coronary artery without angina pectoris: Secondary | ICD-10-CM

## 2023-12-02 DIAGNOSIS — J4540 Moderate persistent asthma, uncomplicated: Secondary | ICD-10-CM | POA: Diagnosis not present

## 2023-12-02 DIAGNOSIS — E1159 Type 2 diabetes mellitus with other circulatory complications: Secondary | ICD-10-CM | POA: Diagnosis not present

## 2023-12-02 DIAGNOSIS — R809 Proteinuria, unspecified: Secondary | ICD-10-CM

## 2023-12-02 DIAGNOSIS — I152 Hypertension secondary to endocrine disorders: Secondary | ICD-10-CM | POA: Diagnosis not present

## 2023-12-02 DIAGNOSIS — E1129 Type 2 diabetes mellitus with other diabetic kidney complication: Secondary | ICD-10-CM | POA: Diagnosis not present

## 2023-12-02 NOTE — Progress Notes (Signed)
 MRN : [id number]  [name]. is a 63 y.o. ([birth year]) male who presents with chief complaint of check carotid arteries.  History of Present Illness:   The patient is seen for follow up evaluation of carotid stenosis. The carotid stenosis was identified remotely.     He continues to have syncopal episodes.  The patient denies amaurosis fugax. There is no recent history of TIA symptoms or focal motor deficits. There is a prior documented CVA which was mild.   There is no history of migraine headaches. There is no history of seizures.   The patient is taking enteric-coated aspirin  81 mg daily.   No recent shortening of the patient's walking distance or new symptoms consistent with claudication.  No history of rest pain symptoms. No new ulcers or wounds of the lower extremities have occurred.   There is no history of DVT, PE or superficial thrombophlebitis. No recent episodes of angina or shortness of breath documented.     Carotid duplex done today shows bilateral 1-39% stenosis with widely patent vertebral arteries.  Current Meds  Medication Sig   Accu-Chek Softclix Lancets lancets 1 each by Other route 5 (five) times daily. Use as instructed   acetaminophen  (TYLENOL ) 500 MG tablet Take 500 mg by mouth every 6 (six) hours as needed.   albuterol  (VENTOLIN  HFA) 108 (90 Base) MCG/ACT inhaler Inhale 2 puffs into the lungs every 6 (six) hours as needed.   aspirin  EC 81 MG tablet Take 1 tablet (81 mg total) by mouth daily.   Blood Glucose Monitoring Suppl (ACCU-CHEK AVIVA PLUS) w/Device KIT 1 each by Does not apply route 5 (five) times daily.   Blood Pressure Monitoring (OMRON 3 SERIES BP MONITOR) DEVI by Does not apply route. Use to monitor and record home blood pressure daily   clopidogrel  (PLAVIX ) 75 MG tablet Take 1 tablet (75 mg total) by mouth daily.   cyanocobalamin  (VITAMIN B12) 1000 MCG tablet Take 1 tablet (1,000 mcg total) by mouth daily.    doxycycline  (VIBRA -TABS) 100 MG tablet Take 1 tablet (100 mg total) by mouth 2 (two) times daily for 7 days.   empagliflozin  (JARDIANCE ) 25 MG TABS tablet Take 1 tablet (25 mg total) by mouth daily.   EPINEPHrine  0.3 mg/0.3 mL IJ SOAJ injection INJECT 1 SYRINGE INTO OUTER THIGH ONCE AS NEEDED FOR SEVERE ALLERGIC REACTION. Strength 0.3 MG/0.3ML   fenofibrate  54 MG tablet Take 1 tablet (54 mg total) by mouth daily.   fluticasone  furoate-vilanterol (BREO ELLIPTA ) 100-25 MCG/ACT AEPB INHALE 1 PUFF BY MOUTH ONCE DAILY. RINSE MOUTH AFTER USE.   gabapentin  (NEURONTIN ) 300 MG capsule Take 1 capsule (300 mg total) by mouth 2 (two) times daily.   gatifloxacin (ZYMAXID) 0.5 % SOLN Place 1 drop into both eyes 4 (four) times daily.   glucose blood (ACCU-CHEK AVIVA PLUS) test strip 1 each by Other route 5 (five) times daily. Use as instructed   insulin  degludec (TRESIBA ) 100 UNIT/ML FlexTouch Pen Inject 45 Units into the skin 2 (two) times daily.   insulin  lispro (HUMALOG ) 100 UNIT/ML injection Inject 8 Units into the skin 3 (three) times daily with meals.   isosorbide  mononitrate (IMDUR ) 30 MG 24 hr tablet Take 1 tablet (30 mg total) by mouth daily.   lisinopril  (ZESTRIL ) 20 MG tablet Take 1 tablet (20 mg total) by mouth daily.  Misc. Devices (PULSE OXIMETER FOR FINGER) MISC To check O2 saturations once daily with asthma and document + check if any shortness of breath   montelukast  (SINGULAIR ) 10 MG tablet Take 1 tablet (10 mg total) by mouth at bedtime.   mupirocin  ointment (BACTROBAN ) 2 % Apply 1 Application topically 2 (two) times daily.   nitroGLYCERIN  (NITROSTAT ) 0.4 MG SL tablet Place 1 tablet (0.4 mg total) under the tongue every 5 (five) minutes as needed for chest pain.   pantoprazole  (PROTONIX ) 40 MG tablet TAKE 1 TABLET BY MOUTH ONCE DAILY   Resmetirom  (REZDIFFRA ) 60 MG TABS Take 1 tablet by mouth daily.   rosuvastatin  (CRESTOR ) 40 MG tablet Take 1 tablet (40 mg total) by mouth daily. Stop 20 MG  tablets.   TRUEPLUS INSULIN  SYRINGE 31G X 5/16 1 ML MISC    valACYclovir  (VALTREX ) 500 MG tablet Take 500 mg by mouth 3 (three) times daily.   Vitamin D , Ergocalciferol , (DRISDOL ) 1.25 MG (50000 UNIT) CAPS capsule Take 1 capsule (50,000 Units total) by mouth once a week.    Past Medical History:  Diagnosis Date   Arrhythmia    Asthma    CCC (chronic calculous cholecystitis)    COPD (chronic obstructive pulmonary disease) (HCC)    Cyst of kidney, acquired    Diabetes mellitus without complication (HCC) 2013   type 2   Diverticulosis    Edentulous    Fatty liver    Gallbladder polyp    GERD (gastroesophageal reflux disease)    Heart murmur    History of chicken pox    History of measles as a child    History of PKU    Hyperlipidemia    Hypertension    IBS (irritable bowel syndrome)    Irregular heart beat    Mentally challenged    Pancreatitis    PONV (postoperative nausea and vomiting)    Stroke (HCC) 03/02/2019   vision issues - right eye/right sided weakness   Vision impairment    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    Past Surgical History:  Procedure Laterality Date   Cardiac Catherization     [facility]   CARDIAC CATHETERIZATION     [facility]   CATARACT EXTRACTION W/PHACO Left 06/05/2021   Procedure: CATARACT EXTRACTION PHACO AND INTRAOCULAR LENS PLACEMENT (IOC) LEFT DIABETIC 3.75 00:28.0;  Surgeon: Myrna Adine Anes, MD;  Location: [facility];  Service: Ophthalmology;  Laterality: Left;  Diabetic   CATARACT EXTRACTION W/PHACO Right 07/10/2021   Procedure: CATARACT EXTRACTION PHACO AND INTRAOCULAR LENS PLACEMENT (IOC) RIGHT DIABETIC;  Surgeon: Myrna Adine Anes, MD;  Location: [facility];  Service: Ophthalmology;  Laterality: Right;  Diabetic 2.66 00:24.4   COLONOSCOPY     COLONOSCOPY WITH PROPOFOL  N/A 08/10/2020   Procedure: COLONOSCOPY WITH PROPOFOL ;  Surgeon: Unk Corinn Skiff, MD;  Location: [facility];  Service: Gastroenterology;   Laterality: N/A;  Has ankle monitor; (Parole officer Ms Kenton needs a time before Monday [phone] - per patient's wife)   ESOPHAGOGASTRODUODENOSCOPY (EGD) WITH PROPOFOL  N/A 12/27/2016   Procedure: ESOPHAGOGASTRODUODENOSCOPY (EGD) WITH PROPOFOL ;  Surgeon: Unk Corinn Skiff, MD;  Location: [facility] END[facility];  Service: Gastroenterology;  Laterality: N/A;   HEMORRHOID SURGERY     KNEE SURGERY     cyst removed   Ligament Removal Left    of left thumb: dr. Neill   ligament removal  of left thumb     Dr. Neill   NM GATED MYOCARDIAL STUDY (ARMX HX)  06/23/2014   Paraschos.  Normal   WISDOM TOOTH EXTRACTION      Social History Social History   Tobacco Use   Smoking status: Never    Passive exposure: Never   Smokeless tobacco: Never  Vaping Use   Vaping status: Never Used  Substance Use Topics   Alcohol use: No    Alcohol/week: 0.0 standard drinks of alcohol   Drug use: No    Family History Family History  Problem Relation Age of Onset   Cancer Mother        throat   Diabetes Father    Heart disease Father    Cancer Maternal Grandmother    Cancer Maternal Grandfather        pancreatic   Cancer Paternal Grandfather     Allergies  Allergen Reactions   Sulfa Antibiotics Shortness Of Breath and Nausea And Vomiting   Bee Venom Hives and Swelling    All kinds of bees   Morphine Nausea And Vomiting   Other     Certain powders (detergent)     REVIEW OF SYSTEMS (Negative unless checked)  Constitutional: [] Weight loss  [] Fever  [] Chills Cardiac: [] Chest pain   [] Chest pressure   [] Palpitations   [] Shortness of breath when laying flat   [] Shortness of breath with exertion. Vascular:  [x] Pain in legs with walking   [] Pain in legs at rest  [] History of DVT   [] Phlebitis   [] Swelling in legs   [] Varicose veins   [] Non-healing ulcers Pulmonary:   [] Uses home oxygen   [] Productive cough   [] Hemoptysis   [] Wheeze  [] COPD   [] Asthma Neurologic:  [] Dizziness   [] Seizures    [] History of stroke   [] History of TIA  [] Aphasia   [] Vissual changes   [] Weakness or numbness in arm   [] Weakness or numbness in leg Musculoskeletal:   [] Joint swelling   [] Joint pain   [] Low back pain Hematologic:  [] Easy bruising  [] Easy bleeding   [] Hypercoagulable state   [] Anemic Gastrointestinal:  [] Diarrhea   [] Vomiting  [] Gastroesophageal reflux/heartburn   [] Difficulty swallowing. Genitourinary:  [] Chronic kidney disease   [] Difficult urination  [] Frequent urination   [] Blood in urine Skin:  [] Rashes   [] Ulcers  Psychological:  [] History of anxiety   []  History of major depression.  Physical Examination  Vitals:   12/02/23 1111  BP: (!) 159/92  Pulse: (!) 59  Weight: 163 lb 6.4 oz (74.1 kg)  Height: 5' 2 (1.575 m)   Body mass index is 29.89 kg/m. Gen: WD/WN, NAD Head: [place]/AT, No temporalis wasting.  Ear/Nose/Throat: Hearing grossly intact, nares w/o erythema or drainage Eyes: PER, EOMI, sclera nonicteric.  Neck: Supple, no masses.  No bruit or JVD.  Pulmonary:  Good air movement, no audible wheezing, no use of accessory muscles.  Cardiac: RRR, normal S1, S2, no Murmurs. Vascular:  carotid bruit noted Vessel Right Left  Radial Palpable Palpable  Carotid  Palpable  Palpable  Gastrointestinal: soft, non-distended. No guarding/no peritoneal signs.  Musculoskeletal: M/S 5/5 throughout.  No visible deformity.  Neurologic: CN 2-12 intact. Pain and light touch intact in extremities.  Symmetrical.  Speech is fluent. Motor exam as listed above. Psychiatric: Judgment intact, Mood & affect appropriate for pt's clinical situation. Dermatologic: No rashes or ulcers noted.  No changes consistent with cellulitis.   CBC Lab Results  Component Value Date   WBC 4.6 11/25/2023   HGB 14.1 11/25/2023   HCT 43.3 11/25/2023   MCV 97 11/25/2023   PLT 156 11/25/2023  BMET    Component Value Date/Time   NA 135 11/25/2023 1403   K 4.3 11/25/2023 1403   CL 103 11/25/2023 1403    CO2 16 (L) 11/25/2023 1403   GLUCOSE 393 (H) 11/25/2023 1403   GLUCOSE 136 (H) 10/04/2023 1337   BUN 22 11/25/2023 1403   CREATININE 0.98 11/25/2023 1403   CALCIUM  8.9 11/25/2023 1403   GFRNONAA >60 10/04/2023 1337   GFRAA 76 02/24/2020 1622   Estimated Creatinine Clearance: 70.7 mL/min (by C-G formula based on SCr of 0.98 mg/dL).  COAG Lab Results  Component Value Date   INR 1.0 03/02/2019    Radiology No results found.   Assessment/Plan 1. Atherosclerosis of both carotid arteries (Primary) Recommend:  Given the patient's asymptomatic subcritical stenosis no further invasive testing or surgery at this time.  Duplex ultrasound shows 1-39% stenosis bilaterally.  Continue antiplatelet therapy as prescribed Continue management of CAD, HTN and Hyperlipidemia Healthy heart diet,  encouraged exercise at least 4 times per week  Follow up in 12 months with duplex ultrasound and physical exam  - VAS US  CAROTID; Future  2. Hypertension associated with diabetes (HCC) Continue antihypertensive medications as already ordered, these medications have been reviewed and there are no changes at this time.  3. Coronary artery disease due to lipid rich plaque Continue cardiac and antihypertensive medications as already ordered and reviewed, no changes at this time.  Continue statin as ordered and reviewed, no changes at this time  Nitrates PRN for chest pain  4. Moderate persistent asthma without complication Continue pulmonary medications and aerosols as already ordered, these medications have been reviewed and there are no changes at this time.   5. Type 2 diabetes mellitus with proteinuria (HCC) Continue hypoglycemic medications as already ordered, these medications have been reviewed and there are no changes at this time.  Hgb A1C to be monitored as already arranged by primary service    Cordella Shawl, MD  12/02/2023 11:39 AM

## 2023-12-03 DIAGNOSIS — E1129 Type 2 diabetes mellitus with other diabetic kidney complication: Secondary | ICD-10-CM | POA: Diagnosis not present

## 2023-12-03 DIAGNOSIS — R809 Proteinuria, unspecified: Secondary | ICD-10-CM | POA: Diagnosis not present

## 2023-12-03 DIAGNOSIS — I10 Essential (primary) hypertension: Secondary | ICD-10-CM | POA: Diagnosis not present

## 2023-12-03 DIAGNOSIS — E785 Hyperlipidemia, unspecified: Secondary | ICD-10-CM | POA: Diagnosis not present

## 2023-12-04 ENCOUNTER — Telehealth: Payer: Self-pay

## 2023-12-10 ENCOUNTER — Ambulatory Visit

## 2023-12-10 ENCOUNTER — Other Ambulatory Visit

## 2023-12-10 NOTE — Progress Notes (Unsigned)
   12/11/2023 Name: Alex M Carbone Jr. MRN: [id number] DOB: March 26, 1960  Subjective/Objective Patient outreach to follow-up on management of chronic conditions  Diabetes: Current medications:   Jardiance  25mg  daily, Tresiba  45 units BID, Lispro 8 units TID before meals -Medications tried in the past: could not tolerate metformin - patient was also taking Januvia  but states this was stopped at recent endocrinology visit (no mention of medication in endo note from 7/14 stating patient was taking or to stop) -A1c 11.5% on 7/6 -Using Accu Check guide to check BG up to 5x/daily -Discussed CGM and benefit in alerting when BG trending low (or high).  Patient/wife state he has tried before, and it did not work well for him.  Prefers to continue daily fingersticks. -BG's had improved at endocrinology visit (week prior to visit on 7/14 readings were 99-168); however BG during telephone visit this morning was 283.  Patient recently had a sausage gravy biscuit for breakfast. -FBG yesterday was 182 -Patient denies any recent hypoglycemic or hyperglycemic symptoms -Patient is in need of refill on lispro but currently does not have transportation to pick up at pharmacy  Hypertension:  Current medications:  lisinopril  20mg , isosorbide  MN ER 30mg  daily -Patient has resumed taking isorsorbide MN ER 30mg  daily -Patient has been checking home BP regularly, and this is varying quite a bit, with SBP 94-190/54--97 with HR consistent in the 60's -Last office BP was 159/92 on 9/15 at vascular surgery visit  Hyperlipidemia: Current medications:  rosuvastatin  40mg  daily, fenofibrate  54mg  daily -Rosuvastatin  increased from 20mg  to 40mg  earlier this month, and patient has started dose increase -Most recently LDL was 81, and TG was down to 153 from 1194  Assessment/Plan  Diabetes: -Uncontrolled -Continue current regimen at this time, but it is likely that postprandial insulin  will need to be increased based on BG  reading after breakfast this morning -Continue to monitor FBG and post-prandial BG and record, so we can review at f/u -Sees endocrinology again in October (will be due for A1c) -Order pending for 1 month supply of Lispro to get patient to endocrinology visit next month.  Pending to go to [facility] for home delivery since patient does not have transportation at this time.  Hypertension: -Uncontrolled -Counseled patient to check BP approximately 30 minutes to 1 hour after taking morning medications.  Also counseled on appropriate technique. -Monitor and record home BP daily -If consistently >130/80, could consider increasing lisinopril  to 40mg  daily  Hyperlipidemia: -Continue current regimen -Patient sees PCP again 12/9, and I recommend follow-up lipid panel and CMP  Follow-up: 11/5  [name], PharmD, DPLA

## 2023-12-11 ENCOUNTER — Other Ambulatory Visit: Payer: Self-pay

## 2023-12-11 ENCOUNTER — Other Ambulatory Visit (HOSPITAL_COMMUNITY): Payer: Self-pay

## 2023-12-11 DIAGNOSIS — E1165 Type 2 diabetes mellitus with hyperglycemia: Secondary | ICD-10-CM

## 2023-12-11 DIAGNOSIS — I152 Hypertension secondary to endocrine disorders: Secondary | ICD-10-CM

## 2023-12-11 DIAGNOSIS — E1169 Type 2 diabetes mellitus with other specified complication: Secondary | ICD-10-CM

## 2023-12-11 MED ORDER — PEN NEEDLES 32G X 4 MM MISC
0 refills | Status: AC
  Filled 2023-12-11 (×2): qty 100, 25d supply, fill #0

## 2023-12-11 MED ORDER — INSULIN LISPRO (1 UNIT DIAL) 100 UNIT/ML (KWIKPEN)
8.0000 [IU] | PEN_INJECTOR | Freq: Three times a day (TID) | SUBCUTANEOUS | 0 refills | Status: DC
  Filled 2023-12-11 (×2): qty 15, 63d supply, fill #0

## 2023-12-11 MED ORDER — VITAMIN D (ERGOCALCIFEROL) 1.25 MG (50000 UNIT) PO CAPS
50000.0000 [IU] | ORAL_CAPSULE | ORAL | 0 refills | Status: DC
  Filled 2023-12-11 (×2): qty 4, 28d supply, fill #0

## 2023-12-12 ENCOUNTER — Other Ambulatory Visit (HOSPITAL_COMMUNITY): Payer: Self-pay

## 2023-12-12 ENCOUNTER — Other Ambulatory Visit: Payer: Self-pay

## 2023-12-12 ENCOUNTER — Ambulatory Visit: Admitting: Cardiovascular Disease

## 2023-12-25 DIAGNOSIS — E785 Hyperlipidemia, unspecified: Secondary | ICD-10-CM | POA: Diagnosis not present

## 2023-12-25 DIAGNOSIS — R809 Proteinuria, unspecified: Secondary | ICD-10-CM | POA: Diagnosis not present

## 2023-12-25 DIAGNOSIS — E1129 Type 2 diabetes mellitus with other diabetic kidney complication: Secondary | ICD-10-CM | POA: Diagnosis not present

## 2023-12-25 DIAGNOSIS — I10 Essential (primary) hypertension: Secondary | ICD-10-CM | POA: Diagnosis not present

## 2023-12-30 ENCOUNTER — Ambulatory Visit: Admitting: Pulmonary Disease

## 2023-12-30 ENCOUNTER — Ambulatory Visit (INDEPENDENT_AMBULATORY_CARE_PROVIDER_SITE_OTHER): Admitting: Vascular Surgery

## 2023-12-30 ENCOUNTER — Encounter (INDEPENDENT_AMBULATORY_CARE_PROVIDER_SITE_OTHER)

## 2023-12-30 NOTE — Progress Notes (Signed)
 Alex M Lookabaugh Jr.                                          MRN: [id number]   12/30/2023   The [facility] Quality Team Specialist reviewed this patient medical record for the purposes of chart review for care gap closure. The following were reviewed: chart review for care gap closure-glycemic status assessment.    [facility]BCI Quality Team

## 2024-01-01 DIAGNOSIS — I10 Essential (primary) hypertension: Secondary | ICD-10-CM | POA: Diagnosis not present

## 2024-01-01 DIAGNOSIS — R809 Proteinuria, unspecified: Secondary | ICD-10-CM | POA: Diagnosis not present

## 2024-01-01 DIAGNOSIS — E875 Hyperkalemia: Secondary | ICD-10-CM | POA: Diagnosis not present

## 2024-01-01 DIAGNOSIS — E1129 Type 2 diabetes mellitus with other diabetic kidney complication: Secondary | ICD-10-CM | POA: Diagnosis not present

## 2024-01-01 DIAGNOSIS — E785 Hyperlipidemia, unspecified: Secondary | ICD-10-CM | POA: Diagnosis not present

## 2024-01-13 ENCOUNTER — Ambulatory Visit

## 2024-01-21 NOTE — Progress Notes (Unsigned)
   12/11/2023 Name: Alex M Blasdell Jr. MRN: [id number] DOB: 01/12/1961  Subjective/Objective Patient outreach to follow-up on management of chronic conditions  Diabetes: Current medications:   Jardiance  25mg  daily, Tresiba  45 units BID, Lispro 8 units TID before meals -Medications tried in the past: could not tolerate metformin - patient was also taking Januvia  but states this was stopped at recent endocrinology visit (no mention of medication in endo note from 7/14 stating patient was taking or to stop) -A1c 11.5% on 7/6 -Using Accu Check guide to check BG up to 5x/daily -Discussed CGM and benefit in alerting when BG trending low (or high).  Patient/wife state he has tried before, and it did not work well for him.  Prefers to continue daily fingersticks. -BG's had improved at endocrinology visit (week prior to visit on 7/14 readings were 99-168); however BG during telephone visit this morning was 283.  Patient recently had a sausage gravy biscuit for breakfast. -FBG yesterday was 182 -Patient denies any recent hypoglycemic or hyperglycemic symptoms -Patient is in need of refill on lispro but currently does not have transportation to pick up at pharmacy  Hypertension:  Current medications:  lisinopril  20mg , isosorbide  MN ER 30mg  daily -Patient has resumed taking isorsorbide MN ER 30mg  daily -Patient has been checking home BP regularly, and this is varying quite a bit, with SBP 94-190/54--97 with HR consistent in the 60's -Last office BP was 159/92 on 9/15 at vascular surgery visit  Hyperlipidemia: Current medications:  rosuvastatin  40mg  daily, fenofibrate  54mg  daily -Rosuvastatin  increased from 20mg  to 40mg  earlier this month, and patient has started dose increase -Most recently LDL was 81, and TG was down to 153 from 1194  Medication Adherence: Recent fill dates Test strips and lancets 10/14 for 40 day supply Lispro 9/24 for 63 day supply Rosuvastatin  40mg  9/13 for 90 day  supply Tresiba  Flextouch 9/12 for 13 day supply, 9/8 for 33 day supply Isosorbide  MN ER 30mg  9/5 for 90 day supply Breo 100/51mcg 8/29 for 30 day supply Resmetirom  60mg  8/21 for 30 day supply Jardiance  25mg  8/4 for 90 day supply  Clopidogrel  75mg  7/28 for 90 day supply Fenofibrate  54mg  7/28 for 90 day supply Gabapentin  300mg  7/28 for 90 day supply Lisinopril  20mg  7/28 for 90 day supply Montelukast  10mg  7/28 for 90 day supply  Pantoprazole  40mg  6/5 for 60 day supply   Assessment/Plan  Diabetes: -Uncontrolled -Continue current regimen at this time, but it is likely that postprandial insulin  will need to be increased based on BG reading after breakfast this morning -Continue to monitor FBG and post-prandial BG and record, so we can review at f/u -Sees endocrinology again in October (will be due for A1c) -Order pending for 1 month supply of Lispro to get patient to endocrinology visit next month.  Pending to go to [facility] for home delivery since patient does not have transportation at this time.  Hypertension: -Uncontrolled -Counseled patient to check BP approximately 30 minutes to 1 hour after taking morning medications.  Also counseled on appropriate technique. -Monitor and record home BP daily -If consistently >130/80, could consider increasing lisinopril  to 40mg  daily  Hyperlipidemia: -Continue current regimen -Patient sees PCP again 12/9, and I recommend follow-up lipid panel and CMP  Follow-up: 11/5  [name], PharmD, DPLA

## 2024-01-22 ENCOUNTER — Other Ambulatory Visit: Payer: Self-pay

## 2024-01-22 DIAGNOSIS — I152 Hypertension secondary to endocrine disorders: Secondary | ICD-10-CM

## 2024-01-22 DIAGNOSIS — E1169 Type 2 diabetes mellitus with other specified complication: Secondary | ICD-10-CM

## 2024-01-22 DIAGNOSIS — E1129 Type 2 diabetes mellitus with other diabetic kidney complication: Secondary | ICD-10-CM

## 2024-01-24 ENCOUNTER — Telehealth: Payer: Self-pay

## 2024-01-24 DIAGNOSIS — Z599 Problem related to housing and economic circumstances, unspecified: Secondary | ICD-10-CM

## 2024-01-24 NOTE — Progress Notes (Signed)
   01/24/2024  Patient ID: [name]., male   DOB: 1960/07/18, 63 y.o.   MRN: [id number]  Outreach attempt to follow-up on medication management and SDOH needs was not successful, but I was able to leave a voicemail and will call back Monday.  [id number] placed for assistance with transportation and food insecurity.  [name], PharmD, DPLA

## 2024-01-27 ENCOUNTER — Ambulatory Visit: Admitting: Cardiovascular Disease

## 2024-01-28 NOTE — Progress Notes (Unsigned)
 01/22/24 Name: Alex M Curvin Jr. MRN: [id number] DOB: [birth year]  Subjective/Objective Patient outreach to follow-up on management of chronic conditions  Diabetes: Current medications:   Jardiance  25mg  daily, Tresiba  45 units BID, Lispro 8 units TID before meals -Medications tried in the past: could not tolerate metformin - patient was also taking Januvia  but states this was stopped at recent endocrinology visit  -A1c 11.5% on 7/6 -Using Accu Check guide to check BG up to 5x/daily and endorses the following recent readings:  231, 92, 125, 135, 110, 180 -Discussed CGM and benefit in alerting when BG trending low (or high).  Patient/wife state he has tried before, and it did not work well for him.  Prefers to continue daily fingersticks. -Patient denies any recent hypoglycemic or hyperglycemic symptoms -Endorses needing a refill on Lispro at this time -Missed 10/20 endo follow-up due to transportation issues  Hypertension:  Current medications:  lisinopril  20mg , isosorbide  MN ER 30mg  daily -Patient has been checking home BP regularly, and this is varying quite a bit, with SBP 94-135/54--74 with HR consistent in the 60's -Last office BP was 159/92 on 9/15 at vascular surgery visit  Hyperlipidemia: Current medications:  rosuvastatin  40mg  daily, fenofibrate  54mg  daily -Rosuvastatin  increased from 20mg  to 40mg  recently, and patient has started dose increase -Most recently LDL was 81, and TG was down to 153 from 1194  Medication Adherence: Recent fill dates Test strips and lancets 10/14 for 40 day supply Lispro 9/24 for 63 day supply Rosuvastatin  40mg  9/13 for 90 day supply Tresiba  Flextouch 9/12 for 13 day supply, 9/8 for 33 day supply Isosorbide  MN ER 30mg  9/5 for 90 day supply Breo 100/22mcg 8/29 for 30 day supply Resmetirom  60mg  8/21 for 30 day supply Jardiance  25mg  8/4 for 90 day supply  Clopidogrel  75mg  7/28 for 90 day supply Fenofibrate  54mg  7/28 for 90 day  supply Gabapentin  300mg  7/28 for 90 day supply Lisinopril  20mg  7/28 for 90 day supply Montelukast  10mg  7/28 for 90 day supply  Pantoprazole  40mg  6/5 for 60 day supply  Kerendia 20mg  ordered by nephrology, but patient has not received this  Assessment/Plan  Diabetes: -Uncontrolled -Continue current regimen at this time, but it is likely that therapy needs to be adjusted for improved control -PMH of pancreatitis x3, so not a good candidate for GLP1 or DPP4 pharmacotherapy -Continue to monitor FBG and post-prandial BG and record, so we can review at f/u -Sees endocrinology again in March; PCP in April- I recommend follow-up sooner -Due for A1c -Verified Lispro 63 day supply filled and delivered by [facility] 9/24, so patient should have medication  Hypertension: -Moderately controlled -Continue current regimen at this time -Monitor/record home BP daily -If consistently >130/80, could consider increasing lisinopril  to 40mg  daily  Hyperlipidemia: -Continue current regimen -Recommend follow-up lipid panel and CMP in December since patient will have been on increased statin dose 10-12 weeks at that time  Medication Adherence: -Contacted [facility], and Kerendia 20mg  requires a PA; they state they faxed nephrology back in Sept about this and claim is still not going through.  I left a vmail for Dr. Christie nurse to inform her of this if they would still like patient to initiate therapy. -Coordinating with social work to see if there are options to help with transportation -Patient may be running low on Tresiba  versus Lispro based on last fill days -Likely out of or running low on several other medications based on fill history  Follow-up: Will follow-up in regard to transportation information  and medication refills and schedule a 4 week follow-up  [name], PharmD, DPLA

## 2024-01-29 ENCOUNTER — Other Ambulatory Visit: Payer: Self-pay | Admitting: Cardiovascular Disease

## 2024-01-29 ENCOUNTER — Telehealth: Payer: Self-pay

## 2024-01-29 ENCOUNTER — Other Ambulatory Visit: Payer: Self-pay

## 2024-01-29 DIAGNOSIS — E1169 Type 2 diabetes mellitus with other specified complication: Secondary | ICD-10-CM

## 2024-01-29 DIAGNOSIS — E1129 Type 2 diabetes mellitus with other diabetic kidney complication: Secondary | ICD-10-CM

## 2024-01-29 DIAGNOSIS — E1165 Type 2 diabetes mellitus with hyperglycemia: Secondary | ICD-10-CM

## 2024-01-29 DIAGNOSIS — I6523 Occlusion and stenosis of bilateral carotid arteries: Secondary | ICD-10-CM

## 2024-01-29 DIAGNOSIS — E1159 Type 2 diabetes mellitus with other circulatory complications: Secondary | ICD-10-CM

## 2024-01-29 DIAGNOSIS — R0789 Other chest pain: Secondary | ICD-10-CM

## 2024-01-29 DIAGNOSIS — I259 Chronic ischemic heart disease, unspecified: Secondary | ICD-10-CM

## 2024-01-29 DIAGNOSIS — K219 Gastro-esophageal reflux disease without esophagitis: Secondary | ICD-10-CM

## 2024-01-29 MED ORDER — VITAMIN D (ERGOCALCIFEROL) 1.25 MG (50000 UNIT) PO CAPS: 50000.0000 [IU] | ORAL_CAPSULE | ORAL | 0 refills | Status: AC

## 2024-01-29 NOTE — Telephone Encounter (Signed)
 Routing to provider to advise. Jardiance  is listed on current med list.

## 2024-01-29 NOTE — Progress Notes (Signed)
 Complex Care Management Note Care Guide Note  01/29/2024 Name: [name]. MRN: [id number] DOB: 30-Nov-1960   Complex Care Management Outreach Attempts: An unsuccessful telephone outreach was attempted today to offer the patient information about available complex care management services.  Follow Up Plan:  Additional outreach attempts will be made to offer the patient complex care management information and services.   Encounter Outcome:  No Answer  Torryn Fiske [facility]  [facility] Resource Care Guide Direct Dial : [phone]  Fax: [phone] Website: [email or web address]

## 2024-01-29 NOTE — Telephone Encounter (Signed)
 Copied from CRM [id number]. Topic: Clinical - Prescription Issue >> Jan 29, 2024 12:51 PM [name] wrote: Reason for CRM: Pharmacist Same from [facility] is calling to verify that if pt is supposed to be on empagliflozin  (JARDIANCE ) 25 MG TABS tablet and JANUVIA  together   [phone]

## 2024-01-29 NOTE — Progress Notes (Signed)
 Sent over

## 2024-01-30 NOTE — Telephone Encounter (Signed)
Called and notified the pharmacy of [name]'s message.

## 2024-01-31 ENCOUNTER — Telehealth: Payer: Self-pay

## 2024-01-31 NOTE — Progress Notes (Signed)
 Complex Care Management Note Care Guide Note  01/31/2024 Name: [name]. MRN: [id number] DOB: Aug 16, 1960   Complex Care Management Outreach Attempts: A second unsuccessful outreach was attempted today to offer the patient with information about available complex care management services.  Follow Up Plan:  Additional outreach attempts will be made to offer the patient complex care management information and services.   Encounter Outcome:  No Answer  Jeret Goyer [facility]  [facility] Resource Care Guide Direct Dial : [phone]  Fax: [phone] Website: [email or web address]

## 2024-02-03 ENCOUNTER — Ambulatory Visit (INDEPENDENT_AMBULATORY_CARE_PROVIDER_SITE_OTHER): Admitting: Vascular Surgery

## 2024-02-03 ENCOUNTER — Encounter (INDEPENDENT_AMBULATORY_CARE_PROVIDER_SITE_OTHER)

## 2024-02-05 ENCOUNTER — Telehealth: Payer: Self-pay

## 2024-02-05 NOTE — Progress Notes (Signed)
 Complex Care Management Note Care Guide Note  02/05/2024 Name: [name]. MRN: [id number] DOB: 10-17-1960   Complex Care Management Outreach Attempts: A third unsuccessful outreach was attempted today to offer the patient with information about available complex care management services.  Follow Up Plan:  No further outreach attempts will be made at this time. We have been unable to contact the patient to offer or enroll patient in complex care management services.  Encounter Outcome:  No Answer  Clarice Bonaventure [facility]  [facility] Resource Care Guide Direct Dial : [phone]  Fax: [phone] Website: [email or web address]

## 2024-02-08 ENCOUNTER — Other Ambulatory Visit: Payer: Self-pay | Admitting: Cardiovascular Disease

## 2024-02-08 ENCOUNTER — Other Ambulatory Visit: Payer: Self-pay | Admitting: Nurse Practitioner

## 2024-02-08 DIAGNOSIS — R079 Chest pain, unspecified: Secondary | ICD-10-CM

## 2024-02-08 DIAGNOSIS — E1165 Type 2 diabetes mellitus with hyperglycemia: Secondary | ICD-10-CM

## 2024-02-08 DIAGNOSIS — I259 Chronic ischemic heart disease, unspecified: Secondary | ICD-10-CM

## 2024-02-08 DIAGNOSIS — I6523 Occlusion and stenosis of bilateral carotid arteries: Secondary | ICD-10-CM

## 2024-02-08 DIAGNOSIS — R0789 Other chest pain: Secondary | ICD-10-CM

## 2024-02-08 DIAGNOSIS — E1169 Type 2 diabetes mellitus with other specified complication: Secondary | ICD-10-CM

## 2024-02-08 DIAGNOSIS — K219 Gastro-esophageal reflux disease without esophagitis: Secondary | ICD-10-CM

## 2024-02-08 DIAGNOSIS — E1159 Type 2 diabetes mellitus with other circulatory complications: Secondary | ICD-10-CM

## 2024-02-10 NOTE — Telephone Encounter (Signed)
 Requested Prescriptions  Pending Prescriptions Disp Refills   nitroGLYCERIN  (NITROSTAT ) 0.4 MG SL tablet [Pharmacy Med Name: NITROGLYCERIN  0.4 MG SL TAB] 90 tablet 0    Sig: DISSOLVE 1 TABLET UNDER THE TONGUE EVERY5 MINS AS NEEDED FOR CHEST PAIN. MAY TAKE UP TO 3 DOSES     Cardiovascular:  Nitrates Failed - 02/10/2024  3:32 PM      Failed - Last BP in normal range    BP Readings from Last 1 Encounters:  12/02/23 (!) 159/92         Passed - Last Heart Rate in normal range    Pulse Readings from Last 1 Encounters:  12/02/23 (!) 59         Passed - Valid encounter within last 12 months    Recent Outpatient Visits           2 months ago Uncontrolled diabetes mellitus with hyperglycemia, with long-term current use of insulin  (HCC)   [facility] [place], Correll T, NP   9 months ago Uncontrolled diabetes mellitus with hyperglycemia, with long-term current use of insulin  (HCC)   [facility] [place], Melanie DASEN, NP       Future Appointments             In 1 month [place], Lauraine BROCKS, MD [facility]   In 3 months Fernand Denyse LABOR, MD [facility]

## 2024-02-13 ENCOUNTER — Other Ambulatory Visit: Payer: Self-pay

## 2024-02-13 ENCOUNTER — Emergency Department
Admission: EM | Admit: 2024-02-13 | Discharge: 2024-02-13 | Disposition: A | Discharge status: Home / Self Care | Attending: Emergency Medicine | Admitting: Emergency Medicine

## 2024-02-13 DIAGNOSIS — K130 Diseases of lips: Secondary | ICD-10-CM | POA: Diagnosis present

## 2024-02-13 DIAGNOSIS — I10 Essential (primary) hypertension: Secondary | ICD-10-CM | POA: Diagnosis not present

## 2024-02-13 DIAGNOSIS — E119 Type 2 diabetes mellitus without complications: Secondary | ICD-10-CM | POA: Insufficient documentation

## 2024-02-13 DIAGNOSIS — L0291 Cutaneous abscess, unspecified: Secondary | ICD-10-CM

## 2024-02-13 MED ORDER — AMOXICILLIN 500 MG PO CAPS: 500.0000 mg | ORAL_CAPSULE | Freq: Three times a day (TID) | ORAL | 0 refills | Status: DC

## 2024-02-13 MED ORDER — AMOXICILLIN 500 MG PO CAPS
500.0000 mg | ORAL_CAPSULE | Freq: Once | ORAL | Status: AC
  Administered 2024-02-13: 500 mg via ORAL
  Filled 2024-02-13: qty 1

## 2024-02-13 NOTE — ED Provider Notes (Signed)
 [name] Provider Note    Event Date/Time   First MD Initiated Contact with Patient 02/13/24 1746     (approximate)   History   Insect Bite   HPI  [name]. is a 63 y.o. male with PKU, hypertension, diabetes along with multiple other past medical problems, past medical history on chart, presents emergency department with a red swollen area on the bottom lip.  Unsure if something bit him.  No fever or chills.  No known injury.  Symptoms started this morning.  Patient arrived here via EMS      Physical Exam   Triage Vital Signs: ED Triage Vitals  Encounter Vitals Group     BP 02/13/24 1703 130/69     Girls Systolic BP Percentile --      Girls Diastolic BP Percentile --      Boys Systolic BP Percentile --      Boys Diastolic BP Percentile --      Pulse Rate 02/13/24 1703 81     Resp 02/13/24 1703 20     Temp 02/13/24 1703 98.3 F (36.8 C)     Temp Source 02/13/24 1703 Oral     SpO2 02/13/24 1703 96 %     Weight 02/13/24 1705 150 lb (68 kg)     Height 02/13/24 1705 5' (1.524 m)     Head Circumference --      Peak Flow --      Pain Score 02/13/24 1704 7     Pain Loc --      Pain Education --      Exclude from Growth Chart --     Most recent vital signs: Vitals:   02/13/24 1703  BP: 130/69  Pulse: 81  Resp: 20  Temp: 98.3 F (36.8 C)  SpO2: 96%     General: Awake, no distress.   CV:  Good peripheral perfusion.  Resp:  Normal effort.  Abd:  No distention.   Other:  Right lower lip with a abraised area that is swollen, infected, no pus or drainage noted, neurovascular intact   ED Results / Procedures / Treatments   Labs (all labs ordered are listed, but only abnormal results are displayed) Labs Reviewed - No data to display   EKG     RADIOLOGY     PROCEDURES:   Procedures  Critical Care:  no Chief Complaint  Patient presents with   Insect Bite      MEDICATIONS ORDERED IN ED: Medications   amoxicillin  (AMOXIL ) capsule 500 mg (500 mg Oral Given 02/13/24 1802)     IMPRESSION / MDM / ASSESSMENT AND PLAN / ED COURSE  I reviewed the triage vital signs and the nursing notes.                              Differential diagnosis includes, but is not limited to, cellulitis, abscess, skin infection  Patient's presentation is most consistent with acute illness / injury with system symptoms.   Medications given: Amoxicillin  1 p.o.  Patient was given amoxicillin  here in the ED.  He was encouraged to apply warm compress 3 times daily.  Return if worsening.  Prescription for amoxicillin  sent to his pharmacy.  Follow-up with his regular doctor if not improving in 2 to 3 days.  Return if worsening.  Discharged stable condition.  Patient and his family member in agreement treatment plan.  FINAL CLINICAL IMPRESSION(S) / ED DIAGNOSES   Final diagnoses:  Abscess     Rx / DC Orders   ED Discharge Orders          Ordered    amoxicillin  (AMOXIL ) 500 MG capsule  3 times daily        02/13/24 1754             Note:  This document was prepared using Dragon voice recognition software and may include unintentional dictation errors.    Gasper Devere ORN, PA-C 02/13/24 1933    Floy Roberts, MD 02/13/24 [phone]

## 2024-02-13 NOTE — Discharge Instructions (Signed)
 Apply a warm compress 3 times daily for about 15 minutes. Take the antibiotic as prescribed Take Tylenol  or ibuprofen  for pain Return if worsening

## 2024-02-13 NOTE — ED Triage Notes (Signed)
 Pt to ED via EMS from home for insect bite to R lower lip that he noticed this morning. Small red area noted.

## 2024-02-17 ENCOUNTER — Emergency Department

## 2024-02-17 ENCOUNTER — Ambulatory Visit: Payer: Self-pay

## 2024-02-17 ENCOUNTER — Other Ambulatory Visit: Payer: Self-pay

## 2024-02-17 ENCOUNTER — Emergency Department
Admission: EM | Admit: 2024-02-17 | Discharge: 2024-02-17 | Disposition: A | Discharge status: Home / Self Care | Attending: Emergency Medicine | Admitting: Emergency Medicine

## 2024-02-17 DIAGNOSIS — K130 Diseases of lips: Secondary | ICD-10-CM | POA: Diagnosis present

## 2024-02-17 DIAGNOSIS — I10 Essential (primary) hypertension: Secondary | ICD-10-CM | POA: Insufficient documentation

## 2024-02-17 DIAGNOSIS — E119 Type 2 diabetes mellitus without complications: Secondary | ICD-10-CM | POA: Diagnosis not present

## 2024-02-17 LAB — COMPREHENSIVE METABOLIC PANEL WITH GFR
ALT: 25 U/L (ref 0–44)
AST: 24 U/L (ref 15–41)
Albumin: 4.3 g/dL (ref 3.5–5.0)
Alkaline Phosphatase: 115 U/L (ref 38–126)
Anion gap: 13 (ref 5–15)
BUN: 18 mg/dL (ref 8–23)
CO2: 19 mmol/L — ABNORMAL LOW (ref 22–32)
Calcium: 9.1 mg/dL (ref 8.9–10.3)
Chloride: 108 mmol/L (ref 98–111)
Creatinine, Ser: 0.87 mg/dL (ref 0.61–1.24)
GFR, Estimated: >60 mL/min (ref >60)
Glucose, Bld: 154 mg/dL — ABNORMAL HIGH (ref 70–99)
Potassium: 3.6 mmol/L (ref 3.5–5.1)
Sodium: 140 mmol/L (ref 135–145)
Total Bilirubin: 0.7 mg/dL (ref 0.0–1.2)
Total Protein: 7.6 g/dL (ref 6.5–8.1)

## 2024-02-17 LAB — CBC WITH DIFFERENTIAL/PLATELET
Abs Immature Granulocytes: 0.03 K/uL (ref 0.00–0.07)
Basophils Absolute: 0 K/uL (ref 0.0–0.1)
Basophils Relative: 1 %
Eosinophils Absolute: 0.1 K/uL (ref 0.0–0.5)
Eosinophils Relative: 2 %
HCT: 40 % (ref 39.0–52.0)
Hemoglobin: 13.2 g/dL (ref 13.0–17.0)
Immature Granulocytes: 0 %
Lymphocytes Relative: 16 %
Lymphs Abs: 1.3 K/uL (ref 0.7–4.0)
MCH: 30.5 pg (ref 26.0–34.0)
MCHC: 33 g/dL (ref 30.0–36.0)
MCV: 92.4 fL (ref 80.0–100.0)
Monocytes Absolute: 0.6 K/uL (ref 0.1–1.0)
Monocytes Relative: 7 %
Neutro Abs: 6.1 K/uL (ref 1.7–7.7)
Neutrophils Relative %: 74 %
Platelets: 173 K/uL (ref 150–400)
RBC: 4.33 MIL/uL (ref 4.22–5.81)
RDW: 12.8 % (ref 11.5–15.5)
WBC: 8.2 K/uL (ref 4.0–10.5)
nRBC: 0 % (ref 0.0–0.2)

## 2024-02-17 LAB — LACTIC ACID, PLASMA: Lactic Acid, Venous: 0.9 mmol/L (ref 0.5–1.9)

## 2024-02-17 MED ORDER — DOXYCYCLINE HYCLATE 100 MG PO TABS
100.0000 mg | ORAL_TABLET | Freq: Once | ORAL | Status: AC
  Administered 2024-02-17: 100 mg via ORAL
  Filled 2024-02-17: qty 1

## 2024-02-17 MED ORDER — IBUPROFEN 600 MG PO TABS
600.0000 mg | ORAL_TABLET | Freq: Once | ORAL | Status: AC
  Administered 2024-02-17: 600 mg via ORAL
  Filled 2024-02-17: qty 1

## 2024-02-17 MED ORDER — IOHEXOL 300 MG/ML  SOLN
75.0000 mL | Freq: Once | INTRAMUSCULAR | Status: AC | PRN
  Administered 2024-02-17: 75 mL via INTRAVENOUS

## 2024-02-17 MED ORDER — DOXYCYCLINE HYCLATE 100 MG PO TABS: 100.0000 mg | ORAL_TABLET | Freq: Two times a day (BID) | ORAL | 0 refills | Status: AC

## 2024-02-17 NOTE — ED Triage Notes (Signed)
 Lower lip bleeding. Seen recently for abscess to lower lip, started on antibiotics.  States bleeding started this morning.  Bleeding controlled at this time.

## 2024-02-17 NOTE — Discharge Instructions (Addendum)
 You have been diagnosed with abscess of the lip.  Please take doxycycline  1 tablet by mouth every 12 hours after main meals.  Please come back to the ED with your PCP if you have new symptoms or symptoms worsen.  If you have fever or chills please come back to ED.

## 2024-02-17 NOTE — ED Provider Notes (Signed)
-----------------------------------------   7:30 PM on 02/17/2024 ----------------------------------------- I have personally seen and evaluated the patient in conjunction with the physician assistant.  Patient's workup shows a reassuring CBC reassuring chemistry normal lactic acid.  Patient has swelling of the right side of his lower lip with mild tenderness to this area.  CT scan shows a 24 x 5 x 5 mm abscess to the right lower lip with some surrounding cellulitis.  Patient otherwise appears well and he is asking to be discharged home.  Physician assistant to drain the abscess we will place the patient on antibiotics and discharge with very strict return precautions.  Patient is agreeable to this plan as well.   Dorothyann Drivers, MD 02/17/24 1931

## 2024-02-17 NOTE — Telephone Encounter (Signed)
Patient currently at ER

## 2024-02-17 NOTE — Telephone Encounter (Signed)
 FYI Only or Action Required?: FYI only for provider: ED advised.  Patient was last seen in primary care on 11/25/2023 by Cannady, Jolene T, NP.  Called Nurse Triage reporting Abscess.  Symptoms began this morning.  Interventions attempted: Rest, hydration, or home remedies.  Symptoms are: gradually worsening.  Triage Disposition: Go to ED Now (or PCP Triage)  Patient/caregiver understands and will follow disposition?: Yes            Copied from CRM #[id number]. Topic: Clinical - Red Word Triage >> Feb 17, 2024 12:40 PM Carla L wrote: Red Word that prompted transfer to Nurse Triage: blood coming from abscess and it looks black, in pain since leaving hospital Reason for Disposition  Black (necrotic), dark purple, or blisters develop in area of boil (abscess)  Answer Assessment - Initial Assessment Questions Abscess on mouth, bleeding-- Went to the ER 02/13/2024 for an abscess and was prescribed amoxicillin  (AMOXIL ) 500 MG capsule  3 times daily   Abscess started bleeding this morning per patient ---patient states it has been bleeding all day and he has used a lot of tissues Patient endorses shortness of breath--states he is always short of breath but it may be worse lately since this spot came up according to his wife Patient does feel weak but wife states he always feels weak and patient cannot say it is any worse than normal --has been using a straw to drink and has been eating soups Wife states the middle of it looks black to her They are advised that with his symptoms it is recommended that he goes to the Emergency Room for further evaluation Wife states she doesn't drive and they are advised it's recommended someone drives the patient for safety Wife states she is going to call the patient's father at this time to see if he can drive them This RN offered to stay on the phone with her and we could call the patient's father but she states that she will go call him. This RN  advised her to call us  back with any further questions/concerns and if anything worsens to call 911 She verbalized understanding  Protocols used: Boil (Skin Abscess)-A-AH

## 2024-02-17 NOTE — ED Provider Notes (Signed)
 [facility] Provider Note    Event Date/Time   First MD Initiated Contact with Patient 02/17/24 1514     (approximate)   History   Oral Swelling    HPI  [name]. is a 63 y.o. male    with a past medical history of PKU, hypertension, diabetes,  who presents to the ED complaining of abscess in the lower lip. According to the patient, he was seen here last Friday, got a prescription for antibiotics.  Patient endorses he continues with pain that is extended to the right cheek, and increased when he opens his mouth.  Patient endorses feeling chills 2 days ago but not anymore.  Patient denies fever, difficulty breathing, difficulty swallowing, chest pain, abdominal pain, diarrhea or urinary symptoms.  Patient is here with his father. Per independent chart review patient was seen here 02/13/2024 with diagnosis of abscess and he was discharged with amoxicillin  500 mg every 8 hours.    Patient Active Problem List   Diagnosis Date Noted   Cellulitis of head or scalp 11/25/2023   Chronic left shoulder pain 11/25/2023   Hyponatremia 09/22/2023   Coronary artery disease with chronic angina 09/22/2023   Bed bug bite 10/31/2022   History of cholecystectomy 03/29/2022   B12 deficiency 04/26/2021   Heart murmur 09/21/2020   Type 2 diabetes mellitus with proteinuria (HCC) 03/25/2019   Atherosclerosis of both carotid arteries 03/22/2019   History of CVA (cerebrovascular accident) 03/06/2019   Uncontrolled diabetes mellitus with hyperglycemia, with long-term current use of insulin  (HCC)    BMI 29.0-29.9,adult 09/03/2018   Frequent falls 05/15/2016   Vitamin D  deficiency 08/22/2015   Carpal tunnel syndrome on left 05/12/2015   External hemorrhoids 12/20/2014   Allergic rhinitis 10/21/2014   Diverticulosis 10/21/2014   Hypertension associated with diabetes (HCC) 10/21/2014   Hyperlipidemia associated with type 2 diabetes mellitus (HCC) 10/21/2014   Asthma  10/21/2014   GERD (gastroesophageal reflux disease) 10/21/2014   NAFLD (nonalcoholic fatty liver disease) 91/95/7983   Arthritis 10/21/2014   Angina pectoris 10/21/2014   History of pancreatitis 10/07/2014   Phenylketonuria (PKU) 10/07/2014   Renal cyst, left 10/07/2014     Physical Exam   Triage Vital Signs: ED Triage Vitals  Encounter Vitals Group     BP 02/17/24 1400 (!) 153/80     Girls Systolic BP Percentile --      Girls Diastolic BP Percentile --      Boys Systolic BP Percentile --      Boys Diastolic BP Percentile --      Pulse Rate 02/17/24 1400 65     Resp 02/17/24 1400 18     Temp 02/17/24 1400 97.8 F (36.6 C)     Temp src --      SpO2 02/17/24 1400 100 %     Weight 02/17/24 1359 150 lb (68 kg)     Height --      Head Circumference --      Peak Flow --      Pain Score 02/17/24 1357 7     Pain Loc --      Pain Education --      Exclude from Growth Chart --     Most recent vital signs: Vitals:   02/17/24 1400 02/17/24 1804  BP: (!) 153/80 (!) 158/81  Pulse: 65 (!) 52  Resp: 18 18  Temp: 97.8 F (36.6 C)   SpO2: 100% 99%     Physical Exam  Vitals and nursing note reviewed.  In triage patient has systolic hypertension  General:          Awake, no distress.  No difficulty breathing, no drooling, no trismus. Face: Mouth: Lower lip, right side with presence of opening with purulent drainage, scab.  Tenderness to palpation of the lip.  Tenderness to palpation in the right cheek, induration in the right cheek about 2 cm diameter.  Mild edema in the right side of the neck.  No warmness.  No lymphadenopathy CV:                  Good peripheral perfusion. Regular rate and rhythm. Resp:               Normal effort. no tachypnea.Equal breath sounds bilaterally.  Abd:                 No distention.  Soft nontender               ED Results / Procedures / Treatments   Labs (all labs ordered are listed, but only abnormal results are displayed) Labs Reviewed   COMPREHENSIVE METABOLIC PANEL WITH GFR - Abnormal; Notable for the following components:      Result Value   CO2 19 (*)    Glucose, Bld 154 (*)    All other components within normal limits  CBC WITH DIFFERENTIAL/PLATELET  LACTIC ACID, PLASMA        PROCEDURES:  Critical Care performed:   .Incision and Drainage  Date/Time: 02/17/2024 7:41 PM  Performed by: Janit Kast, PA-C Authorized by: Janit Kast, PA-C   Consent:    Consent obtained:  Verbal   Consent given by:  Patient and guardian   Risks discussed:  Pain and incomplete drainage Universal protocol:    Procedure explained and questions answered to patient or proxy's satisfaction: yes     Patient identity confirmed:  Verbally with patient Location:    Type:  Abscess   Location:  Mouth   Mouth location: Right lower lip. Pre-procedure details:    Skin preparation:  Povidone-iodine Sedation:    Sedation type:  None Anesthesia:    Anesthesia method:  None Procedure type:    Complexity:  Simple Procedure details:    Ultrasound guidance: no     Incision type: No need  of incision, there was an opening.   Drainage:  Purulent   Drainage amount:  Scant   Wound treatment:  Wound left open   Packing materials:  None Post-procedure details:    Procedure completion:  Tolerated well, no immediate complications    MEDICATIONS ORDERED IN ED: Medications  doxycycline  (VIBRA -TABS) tablet 100 mg (has no administration in time range)  ibuprofen  (ADVIL ) tablet 600 mg (600 mg Oral Given 02/17/24 1611)  iohexol  (OMNIPAQUE ) 300 MG/ML solution 75 mL (75 mLs Intravenous Contrast Given 02/17/24 1742)   Clinical Course as of 02/17/24 1944  Mon Feb 17, 2024  1706 CBC with Differential White blood cells, hemoglobin, neutrophils within normal limits [AE]  1803 Comprehensive metabolic panel(!) Electrolytes, renal function, liver function within normal limit [AE]  1804 Lactic acid, plasma Within normal limit [AE]  1918  CT Soft Tissue Neck W Contrast 24 x 5 x 5 mm abscess in the right lower lip with regional cellulitis.  [AE]  [id number] Updated patient with results of CT.  I did try to do I&D with small amount of pus coming from her face right lower leg.  Dr. Dorothyann personally evaluated  patient.  He is going to be discharged with doxycycline .  Recommendations to come back.  Patient is agreeable with the plan. [AE]    Clinical Course User Index [AE] Janit Kast, PA-C    IMPRESSION / MDM / ASSESSMENT AND PLAN / ED COURSE  I reviewed the triage vital signs and the nursing notes.  Differential diagnosis includes, but is not limited to, lip abscess, facial abscess, cellulitis, dental abscess, parotitis, sialadenitis.  Patient's presentation is most consistent with acute complicated illness / injury requiring diagnostic workup.   Finley M Beahm Jr. is a 63 y.o., male who presents today with history of 4 days of abscess on his lower lip right side.  Patient was seen here on 02/13/2024 he was discharged with amoxicillin  500 mg every 8 hours.  Patient endorses symptoms are worsening, painful to open his mouth and swelling on his right neck.  On a physical exam during triage patient had systolic hypertension.  Patient was afebrile, not tachycardic.  There is presence of right lower lip opening with purulent drainage, tender to palpation.  Tenderness to palpation in the right cheek, induration about 2 cm.  Edema in the right side of the neck, no lymphadenopathy.  No trismus, no drooling, no difficulty breathing. Rest of physical exam is normal. Plan Soft tissue CT with contrast. CBC, CMP, lactic acid Ibuprofen  Reasses Consulted case with Dr.  Dorothyann who authorized the CT.  Reassessed the patient, I was able to drain scant amount of pus from his right lower lip. Patient's diagnosis is consistent with lip abscess with cellulitis.. I independently reviewed and interpreted imaging and agree with radiologists  findings confirming abscess in the right lower lip and cellulitis around.. Labs are  reassuring, white blood cells, neutrophils, lactic acid within normal limits.. I did review the patient's allergies and medications.The patient is in stable and satisfactory condition for discharge home  Patient will be discharged home with prescriptions for doxycycline . Patient is to follow up with PCP as needed or otherwise directed. Patient is given ED precautions to return to the ED for any worsening or new symptoms. Discussed plan of care with patient, answered all of patient's questions, patient agreeable to plan of care. Advised patient to take medications according to the instructions on the label. Discussed possible side effects of new medications. Patient verbalized understanding.  FINAL CLINICAL IMPRESSION(S) / ED DIAGNOSES   Final diagnoses:  Abscess of lip     Rx / DC Orders   ED Discharge Orders          Ordered    doxycycline  (VIBRA -TABS) 100 MG tablet  2 times daily        02/17/24 1944             Note:  This document was prepared using Dragon voice recognition software and may include unintentional dictation errors.   Janit Kast, PA-C 02/17/24 1944    Dorothyann Drivers, MD 02/17/24 2104

## 2024-02-24 NOTE — Progress Notes (Signed)
 [name].                                          MRN: [id number]   02/24/2024   The [facility]BCI Quality Team Specialist reviewed this patient medical record for the purposes of chart review for care gap closure. The following were reviewed: chart review for care gap closure-glycemic status assessment.    VBCI Quality Team

## 2024-02-24 NOTE — Progress Notes (Signed)
 Clemente M Agostino Jr.                                          MRN: [id number]   02/24/2024   The VBCI Quality Team Specialist reviewed this patient medical record for the purposes of chart review for care gap closure. The following were reviewed: chart review for care gap closure-glycemic status assessment.    VBCI Quality Team

## 2024-02-25 ENCOUNTER — Ambulatory Visit: Admitting: Nurse Practitioner

## 2024-02-26 NOTE — Progress Notes (Signed)
 Aubrey M Kurtz Jr.                                          MRN: [id number]   02/26/2024   The VBCI Quality Team Specialist reviewed this patient medical record for the purposes of chart review for care gap closure. The following were reviewed: chart review for care gap closure-glycemic status assessment.    VBCI Quality Team

## 2024-02-29 NOTE — Progress Notes (Unsigned)
° °  03/02/24 Name: Quinntin M Azucena Jr. MRN: [id number] DOB: 29-Mar-1960  Subjective/Objective Patient outreach to follow-up on management of chronic conditions  Diabetes: Current medications:   Jardiance  25mg  daily, Tresiba  45 units BID, Lispro 8 units TID before meals -Medications tried in the past: could not tolerate metformin - patient was also taking Januvia  but states this was stopped at recent endocrinology visit  -A1c 11.5% on 7/6 -Using Accu Check guide to check BG up to 5x/daily.  FBG over the past week is ranging 146-165, post-prandial BG ranging 135-192 -Discussed CGM and benefit in alerting when BG trending low (or high).  Patient/wife state he has tried before, and it did not work well for him.  Prefers to continue daily fingersticks. -Patient denies any recent hypoglycemic or hyperglycemic symptoms -Patient did locate more Lispro he had in the fridge and endorses having plenty of this, Tresiba , and Jardiance  at this time -Missed 10/20 endo follow-up due to transportation issues  Hypertension:  Current medications:  lisinopril  20mg , isosorbide  [place] ER 30mg  daily -Patient has been checking home BP regularly, and readings in the past week have been elevated at 134-170/72-94; HR averaging in the 60's -Last office BP was 159/92 on 9/15 at vascular surgery visit  Hyperlipidemia: Current medications:  rosuvastatin  40mg  daily, fenofibrate  54mg  daily -Rosuvastatin  increased from 20mg  to 40mg  recently, and patient started this dose in September -Most recently LDL was 81, and TG was down to 153 from 1194  Medication Adherence: -Patient/wife endorse that patient currently has all maintenance medications listed below -Recent fill dates reflect he is likely running low/out of some medications: Test strips and lancets 10/14 for 40 day supply Lispro 9/24 for 63 day supply Rosuvastatin  40mg  9/13 for 90 day supply Tresiba  Flextouch 9/12 for 13 day supply, 9/8 for 33 day supply Isosorbide   [place] [facility] 30mg  9/5 for 90 day supply Breo 100/38mcg 8/29 for 30 day supply Resmetirom  60mg  8/21 for 30 day supply Jardiance  25mg  8/4 for 90 day supply  Clopidogrel  75mg  7/28 for 90 day supply Fenofibrate  54mg  7/28 for 90 day supply Gabapentin  300mg  7/28 for 90 day supply Lisinopril  20mg  7/28 for 90 day supply Montelukast  10mg  7/28 for 90 day supply  Pantoprazole  40mg  6/5 for 60 day supply  Kerendia 20mg  11/7 for 30 day supply  Assessment/Plan  Diabetes: -Uncontrolled with A1c goal <7 -Continue current regimen at this time, but it is likely that therapy needs to be adjusted for improved control -PMH of pancreatitis x3, so not a good candidate for GLP1 or DPP4 pharmacotherapy -Continue to monitor FBG and post-prandial BG and record, so we can review at f/u -Sees endocrinology again in March; PCP in April- I recommend follow-up sooner (patient missed recent visits due to transportation issues) -Due for A1c   Hypertension: -Uncontrolled with BP goal <130/80 -Continue current regimen at this time -Continue to monitor/record home BP daily -If consistently >130/80, could consider increasing lisinopril  to 40mg  daily or addition of another agent  Hyperlipidemia: -Continue current regimen -Recommend follow-up lipid panel and CMP in December since patient will have been on increased statin dose 10-12 weeks at that time  Medication Adherence: -Contact pharmacy for refills when patient gets down to 5-7 days of medication remaining -Referral is in for assistance with transportation and additional food resources  Follow-up: 4 weeks  [name], PharmD, DPLA

## 2024-03-02 ENCOUNTER — Other Ambulatory Visit: Payer: Self-pay

## 2024-03-02 DIAGNOSIS — I152 Hypertension secondary to endocrine disorders: Secondary | ICD-10-CM

## 2024-03-02 DIAGNOSIS — E1169 Type 2 diabetes mellitus with other specified complication: Secondary | ICD-10-CM

## 2024-03-02 DIAGNOSIS — E1129 Type 2 diabetes mellitus with other diabetic kidney complication: Secondary | ICD-10-CM

## 2024-03-02 DIAGNOSIS — E1159 Type 2 diabetes mellitus with other circulatory complications: Secondary | ICD-10-CM

## 2024-03-04 ENCOUNTER — Other Ambulatory Visit: Payer: Self-pay

## 2024-03-04 DIAGNOSIS — E1129 Type 2 diabetes mellitus with other diabetic kidney complication: Secondary | ICD-10-CM

## 2024-03-04 MED ORDER — INSULIN LISPRO (1 UNIT DIAL) 100 UNIT/ML (KWIKPEN): 8.0000 [IU] | PEN_INJECTOR | Freq: Three times a day (TID) | SUBCUTANEOUS | 0 refills | Status: DC

## 2024-03-05 ENCOUNTER — Other Ambulatory Visit: Payer: Self-pay | Admitting: Nurse Practitioner

## 2024-03-09 NOTE — Telephone Encounter (Signed)
 Requested Prescriptions  Pending Prescriptions Disp Refills   BREO ELLIPTA  100-25 MCG/ACT AEPB [Pharmacy Med Name: BREO ELLIPTA  100-25 MCG/ACT INH AEP] 60 each 2    Sig: INHALE 1 PUFF BY MOUTH ONCE DAILY. RINSE MOUTH AFTER USE.     Pulmonology:  Combination Products Passed - 03/09/2024  3:01 PM      Passed - Valid encounter within last 12 months    Recent Outpatient Visits           3 months ago Uncontrolled diabetes mellitus with hyperglycemia, with long-term current use of insulin  (HCC)   [facility] Drummond, Marcus T, NP   10 months ago Uncontrolled diabetes mellitus with hyperglycemia, with long-term current use of insulin  [facility])   [place] Valerio Melanie DASEN, NP       Future Appointments             In 3 weeks Kitts, Lauraine BROCKS, MD [facility]   In 2 months Fernand Denyse LABOR, MD [facility]

## 2024-03-23 ENCOUNTER — Telehealth: Payer: Self-pay

## 2024-03-24 ENCOUNTER — Encounter: Payer: Self-pay | Admitting: *Deleted

## 2024-03-24 NOTE — Progress Notes (Signed)
 Alex M Abundis Jr.                                          MRN: [id number]   03/24/2024   The VBCI Quality Team Specialist reviewed this patient medical record for the purposes of chart review for care gap closure. The following were reviewed: chart review for care gap closure-glycemic status assessment.    VBCI Quality Team

## 2024-03-24 NOTE — Telephone Encounter (Signed)
 Pt. Has transferred to [facility] and will be seen by NK on 1/16

## 2024-03-28 ENCOUNTER — Other Ambulatory Visit: Payer: Self-pay | Admitting: Nurse Practitioner

## 2024-03-30 NOTE — Telephone Encounter (Signed)
 April  follow up, has been canceled

## 2024-04-01 ENCOUNTER — Ambulatory Visit

## 2024-04-03 ENCOUNTER — Ambulatory Visit: Admitting: Internal Medicine

## 2024-04-03 ENCOUNTER — Encounter: Payer: Self-pay | Admitting: Internal Medicine

## 2024-04-03 VITALS — BP 90/50 | HR 73 | Ht 62.0 in | Wt 163.4 lb

## 2024-04-03 DIAGNOSIS — K219 Gastro-esophageal reflux disease without esophagitis: Secondary | ICD-10-CM | POA: Diagnosis not present

## 2024-04-03 DIAGNOSIS — Z6829 Body mass index (BMI) 29.0-29.9, adult: Secondary | ICD-10-CM | POA: Diagnosis not present

## 2024-04-03 DIAGNOSIS — I152 Hypertension secondary to endocrine disorders: Secondary | ICD-10-CM

## 2024-04-03 DIAGNOSIS — M130 Polyarthritis, unspecified: Secondary | ICD-10-CM | POA: Diagnosis not present

## 2024-04-03 DIAGNOSIS — E538 Deficiency of other specified B group vitamins: Secondary | ICD-10-CM

## 2024-04-03 DIAGNOSIS — E785 Hyperlipidemia, unspecified: Secondary | ICD-10-CM

## 2024-04-03 DIAGNOSIS — F321 Major depressive disorder, single episode, moderate: Secondary | ICD-10-CM

## 2024-04-03 DIAGNOSIS — G5602 Carpal tunnel syndrome, left upper limb: Secondary | ICD-10-CM

## 2024-04-03 DIAGNOSIS — E1169 Type 2 diabetes mellitus with other specified complication: Secondary | ICD-10-CM

## 2024-04-03 DIAGNOSIS — E663 Overweight: Secondary | ICD-10-CM

## 2024-04-03 DIAGNOSIS — K521 Toxic gastroenteritis and colitis: Secondary | ICD-10-CM

## 2024-04-03 DIAGNOSIS — E1165 Type 2 diabetes mellitus with hyperglycemia: Secondary | ICD-10-CM

## 2024-04-03 DIAGNOSIS — E1159 Type 2 diabetes mellitus with other circulatory complications: Secondary | ICD-10-CM | POA: Diagnosis not present

## 2024-04-03 DIAGNOSIS — E559 Vitamin D deficiency, unspecified: Secondary | ICD-10-CM

## 2024-04-03 LAB — POC CREATINE & ALBUMIN,URINE
Albumin/Creatinine Ratio, Urine, POC: <30
Creatinine, POC: 100 mg/dL
Microalbumin Ur, POC: 30 mg/L

## 2024-04-03 LAB — POCT CBG (FASTING - GLUCOSE)-MANUAL ENTRY: Glucose Fasting, POC: 247 mg/dL — AB (ref 70–99)

## 2024-04-03 MED ORDER — INSULIN LISPRO (1 UNIT DIAL) 100 UNIT/ML (KWIKPEN): PEN_INJECTOR | SUBCUTANEOUS | 0 refills | Status: AC

## 2024-04-03 MED ORDER — METRONIDAZOLE 500 MG PO TABS: 500.0000 mg | ORAL_TABLET | Freq: Three times a day (TID) | ORAL | 0 refills | Status: AC

## 2024-04-03 NOTE — Progress Notes (Signed)
 "  Established Patient Office Visit  Subjective:  Patient ID: [name]., male    DOB: 25-May-1960  Age: 64 y.o. MRN: [id number]  Chief Complaint  Patient presents with   Establish Care    NPE    Patient is here today to establish care with us  as his new PCP. He is already established with Cardiology in the office. He was last seen 10/2023 by Cardiology. At that time specialist recommended stress test and echo that have not been completed to date. Patient is a poor historian. He is accompanied by his wife today.  Patient reports couple weeks worth of diarrhea. Denies recent illness. Wife reports he had blood in his stool in the last few days. He has at least 4 diarrhea stools a day. Treat with amoxicillin  and doxycycline  in the last few months for abscess that has since resolved. Patient has stopped appointment 3 times to stool today. Will collect C. Diff stool sample and start Flagyl  three times day for 10 days. His BP is on the low side today. He denies any dizziness at this time. Reinforced staying hydrated, eating healthy diet and keeping hands clean. Educated patient and wife that if diarrhea continues and does not improve, or new symptoms arise to go to the ED.   Patients wife states she gives him Tresiba  45 units twice daily, Lispro 8 units 3 times a day. Will start sliding scale for Lispro. Established with Endocrinology and sees them every 3-4 months. He reports fasting blood sugars run in 200-300 range. He reports hypoglycemia events on occasion if he is doing strenuous exercise.  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  Family Hx: Father hx DM Unsure of the rest of his  families medical history.  Patient reports left hand and wrist sometimes ache and feel stiff. At this time he is moving his hand, wrist and fingers without difficulty. Will check uric acid and arthritis panel with routine blood work.  PHQ-9 score 15; GAD-7 score 6. Denies self harm or hurting others. Endorses financial burden at home and difficulty affording basic needs. VBCI referral sent to help with SDOH, medications and community resources. Patient reports he has not eaten yet today and will get blood work collected during today's visit.  Last colonoscopy 07/2020 recommend 7 year FU. Patient reports he does not smoke or drink alcohol.     No other concerns at this time.   Past Medical History:  Diagnosis Date   Arrhythmia    Asthma    CCC (chronic calculous cholecystitis)    COPD (chronic obstructive pulmonary disease) (HCC)    Cyst of kidney, acquired    Diabetes mellitus without complication (HCC) 2013   type 2   Diverticulosis    Edentulous    Fatty liver    Gallbladder polyp    GERD (gastroesophageal reflux disease)    Heart murmur    History of chicken pox    History of measles as a child    History of PKU    Hyperlipidemia    Hypertension    IBS (irritable bowel syndrome)    Irregular heart beat    Mentally challenged    Pancreatitis    PONV (postoperative nausea and vomiting)    Stroke (HCC) 03/02/2019  vision issues - right eye/right sided weakness   Vision impairment    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    Past Surgical History:  Procedure Laterality Date   Cardiac Catherization     [facility]   CARDIAC CATHETERIZATION     [facility]   CATARACT EXTRACTION W/PHACO Left 06/05/2021   Procedure: CATARACT EXTRACTION PHACO AND INTRAOCULAR LENS PLACEMENT (IOC) LEFT DIABETIC 3.75 00:28.0;  Surgeon: Myrna Adine Anes, MD;  Location: [facility];  Service: Ophthalmology;  Laterality: Left;  Diabetic   CATARACT EXTRACTION W/PHACO Right 07/10/2021   Procedure:  CATARACT EXTRACTION PHACO AND INTRAOCULAR LENS PLACEMENT (IOC) RIGHT DIABETIC;  Surgeon: Myrna Adine Anes, MD;  Location: [facility];  Service: Ophthalmology;  Laterality: Right;  Diabetic 2.66 00:24.4   COLONOSCOPY     COLONOSCOPY WITH PROPOFOL  N/A 08/10/2020   Procedure: COLONOSCOPY WITH PROPOFOL ;  Surgeon: Unk Corinn Skiff, MD;  Location: [facility] END[facility];  Service: Gastroenterology;  Laterality: N/A;  Has ankle monitor; (Parole officer Ms Kenton needs a time before Monday [phone] - per patient's wife)   ESOPHAGOGASTRODUODENOSCOPY (EGD) WITH PROPOFOL  N/A 12/27/2016   Procedure: ESOPHAGOGASTRODUODENOSCOPY (EGD) WITH PROPOFOL ;  Surgeon: Unk Corinn Skiff, MD;  Location: [facility] END[facility]COPY;  Service: Gastroenterology;  Laterality: N/A;   HEMORRHOID SURGERY     KNEE SURGERY     cyst removed   Ligament Removal Left    of left thumb: dr. Neill   ligament removal  of left thumb     Dr. Neill   NM GATED MYOCARDIAL STUDY (ARMX HX)  06/23/2014   Paraschos. Normal   WISDOM TOOTH EXTRACTION      Social History   Socioeconomic History   Marital status: Married    Spouse name: [name]   Number of children: 0   Years of education: Not on file   Highest education level: 12th grade  Occupational History   Occupation: Disabled  Tobacco Use   Smoking status: Never    Passive exposure: Never   Smokeless tobacco: Never  Vaping Use   Vaping status: Never Used  Substance and Sexual Activity   Alcohol use: No    Alcohol/week: 0.0 standard drinks of alcohol   Drug use: No   Sexual activity: Not Currently  Other Topics Concern   Not on file  Social History Narrative   ** Merged History Encounter **       ** Data from: 10/22/14 Enc Dept: [facility]       ** Data from: 10/07/14 Enc Dept: [facility]   Arrest: Arrested in 2012 for indecent liberties, required to wear ankle monitor   Social Drivers of Health   Tobacco Use: Low Risk (04/03/2024)   Patient  History    Smoking Tobacco Use: Never    Smokeless Tobacco Use: Never    Passive Exposure: Never  Financial Resource Strain: Low Risk (07/02/2023)   Overall Financial Resource Strain (CARDIA)    Difficulty of Paying Living Expenses: Not hard at all  Food Insecurity: No Food Insecurity (09/24/2023)   Epic    Worried About Radiation Protection Practitioner of Food in the Last Year: Never true    Ran Out of Food in the Last Year: Never true  Transportation Needs: No Transportation Needs (09/24/2023)   Epic    Lack of Transportation (Medical): No    Lack of Transportation (Non-Medical): No  Physical Activity: Inactive (07/02/2023)   Exercise Vital Sign    Days of Exercise per Week: 0 days    Minutes  of Exercise per Session: 0 min  Stress: No Stress Concern Present (07/02/2023)   [employer] of Occupational Health - Occupational Stress Questionnaire    Feeling of Stress : Not at all  Social Connections: Socially Isolated (07/02/2023)   Social Connection and Isolation Panel    Frequency of Communication with Friends and Family: Once a week    Frequency of Social Gatherings with Friends and Family: Once a week    Attends Religious Services: Never    Database Administrator or Organizations: No    Attends Banker Meetings: Never    Marital Status: Married  Catering Manager Violence: Not At Risk (09/24/2023)   Epic    Fear of Current or Ex-Partner: No    Emotionally Abused: No    Physically Abused: No    Sexually Abused: No  Depression (PHQ2-9): High Risk (04/03/2024)   Depression (PHQ2-9)    PHQ-2 Score: 15  Alcohol Screen: Low Risk (07/02/2023)   Alcohol Screen    Last Alcohol Screening Score (AUDIT): 0  Housing: Low Risk (09/24/2023)   Epic    Unable to Pay for Housing in the Last Year: No    Number of Times Moved in the Last Year: 0    Homeless in the Last Year: No  Utilities: Not At Risk (09/24/2023)   Epic    Threatened with loss of utilities: No  Health Literacy: Adequate Health Literacy  (07/02/2023)   B1300 Health Literacy    Frequency of need for help with medical instructions: Never    Family History  Problem Relation Age of Onset   Cancer Mother        throat   Diabetes Father    Heart disease Father    Cancer Maternal Grandmother    Cancer Maternal Grandfather        pancreatic   Cancer Paternal Grandfather     Allergies[1]  Show/hide medication list[2]  Review of Systems  Constitutional: Negative.  Negative for chills, fever and malaise/fatigue.  HENT: Negative.  Negative for congestion and sore throat.   Eyes: Negative.  Negative for blurred vision and pain.  Respiratory: Negative.  Negative for cough and shortness of breath.   Cardiovascular: Negative.  Negative for chest pain, palpitations and leg swelling.  Gastrointestinal:  Positive for blood in stool and diarrhea. Negative for abdominal pain, constipation, heartburn, melena, nausea and vomiting.  Genitourinary: Negative.  Negative for dysuria, flank pain, frequency and urgency.  Musculoskeletal:  Positive for joint pain (left hand/wrist). Negative for myalgias.  Skin: Negative.   Neurological: Negative.  Negative for dizziness, tingling, sensory change, weakness and headaches.  Endo/Heme/Allergies: Negative.   Psychiatric/Behavioral:  Positive for depression. Negative for suicidal ideas. The patient is nervous/anxious.        Objective:   BP (!) 90/50   Pulse 73   Ht 5' 2 (1.575 m)   Wt 163 lb 6.4 oz (74.1 kg)   SpO2 95%   BMI 29.89 kg/m   Vitals:   04/03/24 1258  BP: (!) 90/50  Pulse: 73  Height: 5' 2 (1.575 m)  Weight: 163 lb 6.4 oz (74.1 kg)  SpO2: 95%  BMI (Calculated): 29.88    Physical Exam Vitals and nursing note reviewed.  Constitutional:      General: He is not in acute distress.    Appearance: Normal appearance. He is not ill-appearing.  HENT:     Head: Normocephalic and atraumatic.     Nose: Nose normal.  Mouth/Throat:     Mouth: Mucous membranes are  moist.     Pharynx: Oropharynx is clear.  Eyes:     General: Visual field deficit (right eye due to stroke) present.     Conjunctiva/sclera: Conjunctivae normal.     Pupils: Pupils are equal, round, and reactive to light.  Cardiovascular:     Rate and Rhythm: Normal rate and regular rhythm.     Pulses: Normal pulses.     Heart sounds: Normal heart sounds.  Pulmonary:     Effort: Pulmonary effort is normal.     Breath sounds: Normal breath sounds. No wheezing or rhonchi.  Abdominal:     General: Bowel sounds are normal. There is no distension.     Palpations: Abdomen is soft.     Tenderness: There is no abdominal tenderness.  Musculoskeletal:        General: Normal range of motion.     Cervical back: Normal range of motion and neck supple.     Right lower leg: No edema.     Left lower leg: No edema.  Skin:    General: Skin is warm and dry.     Capillary Refill: Capillary refill takes less than 2 seconds.  Neurological:     General: No focal deficit present.     Mental Status: He is alert and oriented to person, place, and time.     Sensory: No sensory deficit.     Motor: No weakness.  Psychiatric:        Mood and Affect: Mood normal. Affect is flat.        Speech: Speech is delayed.        Behavior: Behavior is slowed.        Judgment: Judgment normal.      Results for orders placed or performed in visit on 04/03/24  POCT CBG (Fasting - Glucose)  Result Value Ref Range   Glucose Fasting, POC 247 (A) 70 - 99 mg/dL  POC CREATINE & ALBUMIN,URINE  Result Value Ref Range   Microalbumin Ur, POC 30 mg/L   Creatinine, POC 100 mg/dL   Albumin/Creatinine Ratio, Urine, POC <30     Recent Results (from the past 2160 hours)  Comprehensive metabolic panel     Status: Abnormal   Collection Time: 02/17/24  4:00 PM  Result Value Ref Range   Sodium 140 135 - 145 mmol/L   Potassium 3.6 3.5 - 5.1 mmol/L   Chloride 108 98 - 111 mmol/L   CO2 19 (L) 22 - 32 mmol/L   Glucose, Bld 154  (H) 70 - 99 mg/dL    Comment: Glucose reference range applies only to samples taken after fasting for at least 8 hours.   BUN 18 8 - 23 mg/dL   Creatinine, Ser 9.12 0.61 - 1.24 mg/dL   Calcium  9.1 8.9 - 10.3 mg/dL   Total Protein 7.6 6.5 - 8.1 g/dL   Albumin 4.3 3.5 - 5.0 g/dL   AST 24 15 - 41 U/L   ALT 25 0 - 44 U/L   Alkaline Phosphatase 115 38 - 126 U/L   Total Bilirubin 0.7 0.0 - 1.2 mg/dL   GFR, Estimated >39 >39 mL/min    Comment: (NOTE) Calculated using the CKD-EPI Creatinine Equation (2021)    Anion gap 13 5 - 15    Comment: Performed at [facility], [address], [place], [place] [address]  CBC with Differential     Status: None   Collection Time:  02/17/24  4:00 PM  Result Value Ref Range   WBC 8.2 4.0 - 10.5 K/uL   RBC 4.33 4.22 - 5.81 MIL/uL   Hemoglobin 13.2 13.0 - 17.0 g/dL   HCT 59.9 60.9 - 47.9 %   MCV 92.4 80.0 - 100.0 fL   MCH 30.5 26.0 - 34.0 pg   MCHC 33.0 30.0 - 36.0 g/dL   RDW 87.1 88.4 - 84.4 %   Platelets 173 150 - 400 K/uL   nRBC 0.0 0.0 - 0.2 %   Neutrophils Relative % 74 %   Neutro Abs 6.1 1.7 - 7.7 K/uL   Lymphocytes Relative 16 %   Lymphs Abs 1.3 0.7 - 4.0 K/uL   Monocytes Relative 7 %   Monocytes Absolute 0.6 0.1 - 1.0 K/uL   Eosinophils Relative 2 %   Eosinophils Absolute 0.1 0.0 - 0.5 K/uL   Basophils Relative 1 %   Basophils Absolute 0.0 0.0 - 0.1 K/uL   Immature Granulocytes 0 %   Abs Immature Granulocytes 0.03 0.00 - 0.07 K/uL    Comment: Performed at [facility], [address] [place], [place] [address]  Lactic acid, plasma     Status: None   Collection Time: 02/17/24  4:00 PM  Result Value Ref Range   Lactic Acid, Venous 0.9 0.5 - 1.9 mmol/L    Comment: Performed at [facility], [address] [place], [place] [address]  POCT CBG (Fasting - Glucose)     Status: Abnormal   Collection Time: 04/03/24  1:14 PM  Result Value Ref Range   Glucose Fasting, POC 247 (A) 70 - 99 mg/dL  POC CREATINE &  ALBUMIN,URINE     Status: Normal   Collection Time: 04/03/24  1:44 PM  Result Value Ref Range   Microalbumin Ur, POC 30 mg/L   Creatinine, POC 100 mg/dL   Albumin/Creatinine Ratio, Urine, POC <30       Assessment & Plan:  Start Flagyl  as prescribed. Start Lispro sliding scale as prescribed. Continue other medications as prescribed. Reinforced healthy diet and exercise as tolerated. Reinforced if diarrhea does not improve or worsen to go to ED. VBCI referral sent. Stool cultures ordered. UAC today. Problem List Items Addressed This Visit       Cardiovascular and Mediastinum   Hypertension associated with diabetes (HCC)   Relevant Medications   insulin  lispro (HUMALOG  KWIKPEN) 100 UNIT/ML KwikPen   Other Relevant Orders   CBC with Diff   CMP14+EGFR   AMB Referral VBCI Care Management     Digestive   GERD (gastroesophageal reflux disease)   Relevant Orders   CBC with Diff   CMP14+EGFR   Antibiotic-associated colitis   Relevant Medications   metroNIDAZOLE  (FLAGYL ) 500 MG tablet   Other Relevant Orders   C difficile Toxins A+B W/Rflx   Ova and parasite examination     Endocrine   Hyperlipidemia associated with type 2 diabetes mellitus (HCC) - Primary   Relevant Medications   insulin  lispro (HUMALOG  KWIKPEN) 100 UNIT/ML KwikPen   Other Relevant Orders   Lipid Panel w/o Chol/HDL Ratio   AMB Referral VBCI Care Management   Uncontrolled type 2 diabetes mellitus with hyperglycemia (HCC)   Relevant Medications   insulin  lispro (HUMALOG  KWIKPEN) 100 UNIT/ML KwikPen   Other Relevant Orders   POCT CBG (Fasting - Glucose) (Completed)   POC CREATINE & ALBUMIN,URINE (Completed)   Hemoglobin A1c   AMB Referral VBCI Care Management     Nervous and Auditory  Carpal tunnel syndrome on left     Musculoskeletal and Integument   Arthritis involving small and large joints   Relevant Orders   Uric acid   Arthritis Panel     Other   Vitamin D  deficiency   Relevant Orders    Vitamin D  (25 hydroxy)   B12 deficiency   Relevant Orders   Vitamin B12   Overweight with body mass index (BMI) of 29 to 29.9 in adult   Current moderate episode of major depressive disorder (HCC)    Return in about 1 week (around 04/10/2024).   Total time spent: 60 minutes. This time includes review of previous notes and results and patient face to face interaction during today's visit.    FERNAND FREDY RAMAN, MD  04/03/2024   This document may have been prepared by [facility] Voice Recognition software and as such may include unintentional dictation errors.     [1]  Allergies Allergen Reactions   Sulfa Antibiotics Shortness Of Breath and Nausea And Vomiting   Bee Venom Hives and Swelling    All kinds of bees   Morphine Nausea And Vomiting   Other     Certain powders (detergent)  [2]  Outpatient Medications Prior to Visit  Medication Sig   Accu-Chek Softclix Lancets lancets 1 each by Other route 5 (five) times daily. Use as instructed   acetaminophen  (TYLENOL ) 500 MG tablet Take 500 mg by mouth every 6 (six) hours as needed.   albuterol  (VENTOLIN  HFA) 108 (90 Base) MCG/ACT inhaler Inhale 2 puffs into the lungs every 6 (six) hours as needed.   aspirin  EC 81 MG tablet Take 1 tablet (81 mg total) by mouth daily.   Blood Glucose Monitoring Suppl (ACCU-CHEK AVIVA PLUS) w/Device KIT 1 each by Does not apply route 5 (five) times daily.   Blood Pressure Monitoring (OMRON 3 SERIES BP MONITOR) DEVI by Does not apply route. Use to monitor and record home blood pressure daily   BREO ELLIPTA  100-25 MCG/ACT AEPB INHALE 1 PUFF BY MOUTH ONCE DAILY. RINSE MOUTH AFTER USE.   clopidogrel  (PLAVIX ) 75 MG tablet Take 1 tablet (75 mg total) by mouth daily.   cyanocobalamin  (VITAMIN B12) 1000 MCG tablet Take 1 tablet (1,000 mcg total) by mouth daily.   empagliflozin  (JARDIANCE ) 25 MG TABS tablet Take 1 tablet (25 mg total) by mouth daily.   EPINEPHrine  0.3 mg/0.3 mL IJ SOAJ injection INJECT 1 SYRINGE INTO  OUTER THIGH ONCE AS NEEDED FOR SEVERE ALLERGIC REACTION. Strength 0.3 MG/0.3ML   fenofibrate  54 MG tablet Take 1 tablet (54 mg total) by mouth daily.   gabapentin  (NEURONTIN ) 300 MG capsule Take 1 capsule (300 mg total) by mouth 2 (two) times daily.   glucose blood (ACCU-CHEK AVIVA PLUS) test strip 1 each by Other route 5 (five) times daily. Use as instructed   insulin  degludec (TRESIBA ) 100 UNIT/ML FlexTouch Pen Inject 45 Units into the skin 2 (two) times daily.   Insulin  Pen Needle (PEN NEEDLES) 32G X 4 MM MISC Use to inject insulin  (patient using 4 injections daily)   isosorbide  mononitrate (IMDUR ) 30 MG 24 hr tablet Take 1 tablet (30 mg total) by mouth daily.   JANUVIA  100 MG tablet Take 100 mg by mouth daily.   KERENDIA 20 MG TABS Take 1 tablet by mouth daily.   lisinopril  (ZESTRIL ) 20 MG tablet Take 1 tablet (20 mg total) by mouth daily.   montelukast  (SINGULAIR ) 10 MG tablet Take 1 tablet (10 mg total) by mouth at bedtime.  mupirocin  ointment (BACTROBAN ) 2 % Apply 1 Application topically 2 (two) times daily.   nitroGLYCERIN  (NITROSTAT ) 0.4 MG SL tablet DISSOLVE 1 TABLET UNDER THE TONGUE EVERY5 MINS AS NEEDED FOR CHEST PAIN. MAY TAKE UP TO 3 DOSES   pantoprazole  (PROTONIX ) 40 MG tablet TAKE 1 TABLET BY MOUTH ONCE DAILY   rosuvastatin  (CRESTOR ) 40 MG tablet Take 1 tablet (40 mg total) by mouth daily. Stop 20 MG tablets.   TRUEPLUS INSULIN  SYRINGE 31G X 5/16 1 ML MISC    valACYclovir  (VALTREX ) 500 MG tablet Take 500 mg by mouth 3 (three) times daily.   Vitamin D , Ergocalciferol , (DRISDOL ) 1.25 MG (50000 UNIT) CAPS capsule Take 1 capsule (50,000 Units total) by mouth once a week.   [DISCONTINUED] insulin  lispro (HUMALOG  KWIKPEN) 100 UNIT/ML KwikPen Inject 8 Units into the skin 3 (three) times daily. Patient needs PCP follow-up as soon as possible for additional medication refills.   Misc. Devices (PULSE OXIMETER FOR FINGER) MISC To check O2 saturations once daily with asthma and document +  check if any shortness of breath (Patient not taking: Reported on 04/03/2024)   Resmetirom  (REZDIFFRA ) 60 MG TABS Take 1 tablet by mouth daily. (Patient not taking: Reported on 04/03/2024)   No facility-administered medications prior to visit.   "

## 2024-04-04 LAB — ARTHRITIS PANEL
Anti Nuclear Antibody (ANA): NEGATIVE
Rheumatoid fact SerPl-aCnc: <10 [IU]/mL
Sed Rate: 6 mm/h (ref 0–30)
Uric Acid: 5.5 mg/dL (ref 3.8–8.4)

## 2024-04-04 LAB — CBC WITH DIFFERENTIAL/PLATELET
Basophils Absolute: 0 x10E3/uL (ref 0.0–0.2)
Basos: 1 %
EOS (ABSOLUTE): 0.1 x10E3/uL (ref 0.0–0.4)
Eos: 2 %
Hematocrit: 43.1 % (ref 37.5–51.0)
Hemoglobin: 14.2 g/dL (ref 13.0–17.7)
Immature Grans (Abs): 0 x10E3/uL (ref 0.0–0.1)
Immature Granulocytes: 0 %
Lymphocytes Absolute: 1.4 x10E3/uL (ref 0.7–3.1)
Lymphs: 22 %
MCH: 31.5 pg (ref 26.6–33.0)
MCHC: 32.9 g/dL (ref 31.5–35.7)
MCV: 96 fL (ref 79–97)
Monocytes Absolute: 0.6 x10E3/uL (ref 0.1–0.9)
Monocytes: 9 %
Neutrophils Absolute: 4.1 x10E3/uL (ref 1.4–7.0)
Neutrophils: 65 %
Platelets: 170 x10E3/uL (ref 150–450)
RBC: 4.51 x10E6/uL (ref 4.14–5.80)
RDW: 12.5 % (ref 11.6–15.4)
WBC: 6.2 x10E3/uL (ref 3.4–10.8)

## 2024-04-04 LAB — LIPID PANEL W/O CHOL/HDL RATIO
Cholesterol, Total: 182 mg/dL (ref 100–199)
HDL: 35 mg/dL — ABNORMAL LOW
LDL Chol Calc (NIH): 92 mg/dL (ref 0–99)
Triglycerides: 330 mg/dL — ABNORMAL HIGH (ref 0–149)
VLDL Cholesterol Cal: 55 mg/dL — ABNORMAL HIGH (ref 5–40)

## 2024-04-04 LAB — CMP14+EGFR
ALT: 40 IU/L (ref 0–44)
AST: 25 IU/L (ref 0–40)
Albumin: 4.6 g/dL (ref 3.9–4.9)
Alkaline Phosphatase: 167 IU/L — ABNORMAL HIGH (ref 47–123)
BUN/Creatinine Ratio: 23 (ref 10–24)
BUN: 32 mg/dL — ABNORMAL HIGH (ref 8–27)
Bilirubin Total: 0.5 mg/dL (ref 0.0–1.2)
CO2: 19 mmol/L — ABNORMAL LOW (ref 20–29)
Calcium: 9.5 mg/dL (ref 8.6–10.2)
Chloride: 105 mmol/L (ref 96–106)
Creatinine, Ser: 1.38 mg/dL — ABNORMAL HIGH (ref 0.76–1.27)
Globulin, Total: 2.5 g/dL (ref 1.5–4.5)
Glucose: 243 mg/dL — ABNORMAL HIGH (ref 70–99)
Potassium: 4.4 mmol/L (ref 3.5–5.2)
Sodium: 140 mmol/L (ref 134–144)
Total Protein: 7.1 g/dL (ref 6.0–8.5)
eGFR: 58 mL/min/1.73 — ABNORMAL LOW

## 2024-04-04 LAB — VITAMIN B12: Vitamin B-12: 353 pg/mL (ref 232–1245)

## 2024-04-04 LAB — HEMOGLOBIN A1C
Est. average glucose Bld gHb Est-mCnc: 240 mg/dL
Hgb A1c MFr Bld: 10 % — ABNORMAL HIGH (ref 4.8–5.6)

## 2024-04-04 LAB — VITAMIN D 25 HYDROXY (VIT D DEFICIENCY, FRACTURES): Vit D, 25-Hydroxy: 13 ng/mL — ABNORMAL LOW (ref 30.0–100.0)

## 2024-04-06 ENCOUNTER — Telehealth: Payer: Self-pay

## 2024-04-06 ENCOUNTER — Ambulatory Visit: Payer: Self-pay | Admitting: Internal Medicine

## 2024-04-06 DIAGNOSIS — K521 Toxic gastroenteritis and colitis: Secondary | ICD-10-CM

## 2024-04-06 LAB — OVA AND PARASITE EXAMINATION

## 2024-04-06 NOTE — Progress Notes (Signed)
 Complex Care Management Note  Care Guide Note 04/06/2024 Name: [name]. MRN: [id number] DOB: 13-May-1960  [name]. is a 64 y.o. year old male who sees Fernand Fredy RAMAN, MD for primary care. I reached out to [employer]. by phone today to offer complex care management services.  [name] was given information about Complex Care Management services today including:   The Complex Care Management services include support from the care team which includes your Nurse Care Manager, Clinical Social Worker, or Pharmacist.  The Complex Care Management team is here to help remove barriers to the health concerns and goals most important to you. Complex Care Management services are voluntary, and the patient may decline or stop services at any time by request to their care team member.   Complex Care Management Consent Status: Patient agreed to services and verbal consent obtained.   Follow up plan:  Telephone appointment with complex care management team member scheduled for:  04/10/24 @ 2 PM  Encounter Outcome:  Patient Scheduled  [name] Guide  Direct Dial : [phone]  Fax [phone]

## 2024-04-07 ENCOUNTER — Telehealth: Payer: Self-pay

## 2024-04-07 DIAGNOSIS — E1129 Type 2 diabetes mellitus with other diabetic kidney complication: Secondary | ICD-10-CM

## 2024-04-07 LAB — C DIFFICILE TOXINS A+B W/RFLX: C difficile Toxins A+B, EIA: NEGATIVE

## 2024-04-07 LAB — C DIFFICILE, CYTOTOXIN B

## 2024-04-07 NOTE — Progress Notes (Signed)
 "  04/07/2024 Name: Alex M Dickard Jr. MRN: [id number] DOB: October 15, 1960  Chief Complaint  Patient presents with   Medication Management   Diabetes    [name]. is a 64 y.o. year old male who presented for a telephone visit.   They were referred to the pharmacist by their PCP for assistance in managing diabetes and medication access.    Subjective:  Care Team: Primary Care Provider: Fernand Fredy RAMAN, MD   Medication Access/Adherence  Current Pharmacy:  [facility] [place], [place] - [address] [place] [address] Phone: [phone] Fax: [phone]  [facility] [address] [place] [address] Phone: [phone] Fax: [phone]  [facility] - [place], [place] - [address] [place] [address] Phone: [phone] Fax: [phone]  [facility] - [facility] [address] [place] [address] Phone: [phone] Fax: [phone]   Patient reports affordability concerns with their medications: No  Patient reports access/transportation concerns to their pharmacy: No  Patient reports adherence concerns with their medications:  No     Diabetes:  Current medications: Tresiba  45 units BID, Humalog  SS, Januvia  100mg  daily, Jardiance  25mg  Medications tried in the past: Glipizide  Metformin   Current glucose readings:  257 fasting today  Yesterday: Fasting 174 Lunch: 221 Dinner: 165  Day before:  165 fasting Lunch: 235 Dinner: 145   Observed patterns:  Patient denies hypoglycemic s/sx including dizziness, shakiness, sweating. Patient denies hyperglycemic symptoms including polyuria, polydipsia, polyphagia, nocturia, neuropathy, blurred vision.   Current medication access support: None  Macrovascular and Microvascular Risk Reduction:  Statin? yes (Crestor  40mg ); ACEi/ARB? yes (lisinopril  20mg ) Last  urinary albumin/creatinine ratio:  Lab Results  Component Value Date   MICRALBCREAT <30 04/03/2024   MICRALBCREAT 30-300 (H) 04/30/2023   MICRALBCREAT >300 (H) 03/23/2022   MICRALBCREAT <30 04/26/2021   MICRALBCREAT <30 01/18/2021   MICRALBCREAT 30-300 (H) 06/01/2020   MICRALBCREAT 30-300 (H) 07/17/2019   MICRALBCREAT >300 (H) 03/25/2019   MICRALBCREAT 30-300 (H) 12/02/2018   MICRALBCREAT n/a 08/22/2015   Last eye exam:  Lab Results  Component Value Date   HMDIABEYEEXA Retinopathy (A) 10/01/2023   Last foot exam: No foot exam found Tobacco Use:  Tobacco Use: Low Risk (04/03/2024)   Patient History    Smoking Tobacco Use: Never    Smokeless Tobacco Use: Never    Passive Exposure: Never   Hypertension:  Current medications: Lisinopril  20mg  Medications previously tried: None  Patient has a validated, automated, upper arm home BP cuff Current blood pressure readings readings: 101/64 HR 65  Patient denies hypotensive s/sx including dizziness, lightheadedness.  Patient denies hypertensive symptoms including headache, chest pain, shortness of breath   Hyperlipidemia/ASCVD Risk Reduction  Current lipid lowering medications: Crestor  40mg , Fenofibrate  Medications tried in the past: Vascepa   Antiplatelet regimen: Aspirin  81mg    Medication Management: -Reports has all medications but only 10 days left on hand of Rezdiffra  -Denies cost issues with meds   Objective:  Lab Results  Component Value Date   HGBA1C 10.0 (H) 04/03/2024    Lab Results  Component Value Date   CREATININE 1.38 (H) 04/03/2024   BUN 32 (H) 04/03/2024   NA 140 04/03/2024   K 4.4 04/03/2024   CL 105 04/03/2024   CO2 19 (L) 04/03/2024    Lab Results  Component Value Date   CHOL 182 04/03/2024   HDL 35 (L) 04/03/2024  LDLCALC 92 04/03/2024   TRIG 330 (H) 04/03/2024   CHOLHDL 5.3 03/03/2019    Medications Reviewed Today     Reviewed by Lionell Jon DEL, RPH (Pharmacist) on 04/07/24 at  1200  Med List Status: <None>   Medication Order Taking? Sig Documenting Provider Last Dose Status Informant  Accu-Chek Softclix Lancets lancets [id number] Yes 1 each by Other route 5 (five) times daily. Use as instructed Cannady, Jolene T, NP  Active Spouse/Significant Other, Pharmacy Records  acetaminophen  (TYLENOL ) 500 MG tablet [id number] Yes Take 500 mg by mouth every 6 (six) hours as needed. [provider]  Active Spouse/Significant Other, Pharmacy Records  albuterol  (VENTOLIN  HFA) 108 [phone] Base) MCG/ACT inhaler [id number] Yes Inhale 2 puffs into the lungs every 6 (six) hours as needed. Cannady, Jolene T, NP  Active   aspirin  EC 81 MG tablet [id number] Yes Take 1 tablet (81 mg total) by mouth daily. Cannady, Jolene T, NP  Active   Blood Glucose Monitoring Suppl (ACCU-CHEK AVIVA PLUS) w/Device KIT [id number] Yes 1 each by Does not apply route 5 (five) times daily. Valerio Melanie DASEN, NP  Active Spouse/Significant Other, Pharmacy Records  Blood Pressure Monitoring (OMRON 3 SERIES BP MONITOR) DEVI [id number] Yes by Does not apply route. Use to monitor and record home blood pressure daily [provider]  Active   BREO ELLIPTA  100-25 MCG/ACT AEPB [id number] Yes INHALE 1 PUFF BY MOUTH ONCE DAILY. RINSE MOUTH AFTER USE. Cannady, Jolene T, NP  Active   clopidogrel  (PLAVIX ) 75 MG tablet [id number] Yes Take 1 tablet (75 mg total) by mouth daily. Valerio Melanie T, NP  Active Spouse/Significant Other, Pharmacy Records  cyanocobalamin  (VITAMIN B12) 1000 MCG tablet [id number] Yes Take 1 tablet (1,000 mcg total) by mouth daily. Cannady, Jolene T, NP  Active   empagliflozin  (JARDIANCE ) 25 MG TABS tablet [id number] Yes Take 1 tablet (25 mg total) by mouth daily. Valerio Melanie DASEN, NP  Active Spouse/Significant Other, Pharmacy Records  EPINEPHrine  0.3 mg/0.3 mL IJ SOAJ injection [id number] Yes INJECT 1 SYRINGE INTO OUTER THIGH ONCE AS NEEDED FOR SEVERE ALLERGIC REACTION. Strength 0.3 MG/0.3ML Cannady,  Jolene T, NP  Active Spouse/Significant Other, Pharmacy Records           Med Note SIGRID, TIFFANY N   Sun Sep 22, 2023 12:46 AM) PRN  fenofibrate  54 MG tablet [id number] Yes Take 1 tablet (54 mg total) by mouth daily. Cannady, Jolene T, NP  Active Spouse/Significant Other, Pharmacy Records  gabapentin  (NEURONTIN ) 300 MG capsule [id number] Yes Take 1 capsule (300 mg total) by mouth 2 (two) times daily. Valerio Melanie DASEN, NP  Active Spouse/Significant Other, Pharmacy Records  glucose blood (ACCU-CHEK AVIVA PLUS) test strip [id number] Yes 1 each by Other route 5 (five) times daily. Use as instructed Cannady, Jolene T, NP  Active Spouse/Significant Other, Pharmacy Records  insulin  degludec (TRESIBA ) 100 UNIT/ML FlexTouch Pen [id number] Yes Inject 45 Units into the skin 2 (two) times daily. Cannady, Jolene T, NP  Active   insulin  lispro (HUMALOG  KWIKPEN) 100 UNIT/ML KwikPen [id number] Yes 0 units blood sugars </= 150; 3 units blood sugars 151-200; 6 units blood sugars 201-250; 9 units blood sugars 215-300; 12 units blood sugars =/>301 Fernand Fredy RAMAN, MD  Active   Insulin  Pen Needle (PEN NEEDLES) 32G X 4 MM MISC [id number] Yes Use to inject insulin  (patient using 4 injections daily) Cannady, Jolene T, NP  Active   isosorbide  mononitrate (IMDUR ) 30 MG 24 hr tablet [id number] Yes Take 1 tablet (30 mg total)  by mouth daily. Fernand Denyse LABOR, MD  Active   JANUVIA  100 MG tablet [id number] Yes Take 100 mg by mouth daily. [provider]  Active   KERENDIA 20 MG TABS [id number] Yes Take 1 tablet by mouth daily. [provider]  Active   lisinopril  (ZESTRIL ) 20 MG tablet [id number] Yes Take 1 tablet (20 mg total) by mouth daily. Valerio Moris T, NP  Active Spouse/Significant Other, Pharmacy Records  metroNIDAZOLE  (FLAGYL ) 500 MG tablet [id number]  Take 1 tablet (500 mg total) by mouth 3 (three) times daily for 10 days. Fernand Fredy RAMAN, MD  Active   Misc. Devices (PULSE OXIMETER FOR FINGER) MISC [id number]   To check O2 saturations once daily with asthma and document + check if any shortness of breath  Patient not taking: Reported on 04/03/2024   Cannady, Jolene T, NP  Active Spouse/Significant Other, Pharmacy Records  montelukast  (SINGULAIR ) 10 MG tablet [id number] Yes Take 1 tablet (10 mg total) by mouth at bedtime. Valerio Moris DASEN, NP  Active Spouse/Significant Other, Pharmacy Records  mupirocin  ointment (BACTROBAN ) 2 % [id number] Yes Apply 1 Application topically 2 (two) times daily. Cannady, Jolene T, NP  Active   nitroGLYCERIN  (NITROSTAT ) 0.4 MG SL tablet [id number] Yes DISSOLVE 1 TABLET UNDER THE TONGUE EVERY5 MINS AS NEEDED FOR CHEST PAIN. MAY TAKE UP TO 3 DOSES Cannady, Jolene T, NP  Active   pantoprazole  (PROTONIX ) 40 MG tablet [id number] Yes TAKE 1 TABLET BY MOUTH ONCE DAILY Fernand Denyse LABOR, MD  Active Spouse/Significant Other, Pharmacy Records  Resmetirom  (REZDIFFRA ) 60 MG TABS [id number] Yes Take 1 tablet by mouth daily. Unk Corinn Skiff, MD  Active Spouse/Significant Other, Pharmacy Records  rosuvastatin  (CRESTOR ) 40 MG tablet [id number] Yes Take 1 tablet (40 mg total) by mouth daily. Stop 20 MG tablets. Valerio Moris T, NP  Active   TRUEPLUS INSULIN  SYRINGE 31G X 5/16 1 ML MISC [id number] Yes  [provider]  Active   valACYclovir  (VALTREX ) 500 MG tablet [id number] Yes Take 500 mg by mouth 3 (three) times daily. [provider]  Active Spouse/Significant Other, Pharmacy Records  Vitamin D , Ergocalciferol , (DRISDOL ) 1.25 MG (50000 UNIT) CAPS capsule [id number] Yes Take 1 capsule (50,000 Units total) by mouth once a week. Valerio Moris T, NP  Active               Assessment/Plan:   Diabetes: - Currently uncontrolled; goal A1c <7%. Cardiorenal risk reduction is optimized.. Blood pressure is at goal <130/80. LDL is not at goal.  -Reviewed sliding scale instructions for insulin  lispro as changed by PCP on 04/03/24. Patient will start today with those instructions,  Continue all other meds. -Reviewed BG goals  Hypertension: - Currently controlled - Reviewed long term cardiovascular and renal outcomes of uncontrolled blood pressure - Reviewed appropriate blood pressure monitoring technique and reviewed goal blood pressure. Recommended to check home blood pressure and heart rate daily - Recommend to continue current med therapy      Hyperlipidemia/ASCVD Risk Reduction: - Currently uncontrolled.  - Reviewed long term complications of uncontrolled cholesterol - Recommend to consider addtion of zetia if next lipid check is still elevated      Medication Management: -Recommend calling gastro office urgently to discuss rizdeffra refill -Reach out sooner than scheduled follow up if med issues arise    Follow Up Plan: 4 weeks  Jon VEAR [place], PharmD Clinical Pharmacist [phone]  "

## 2024-04-08 ENCOUNTER — Emergency Department

## 2024-04-08 ENCOUNTER — Other Ambulatory Visit: Payer: Self-pay

## 2024-04-08 ENCOUNTER — Emergency Department
Admission: EM | Admit: 2024-04-08 | Discharge: 2024-04-08 | Disposition: A | Discharge status: Home / Self Care | Attending: Emergency Medicine | Admitting: Emergency Medicine

## 2024-04-08 ENCOUNTER — Encounter: Payer: Self-pay | Admitting: *Deleted

## 2024-04-08 DIAGNOSIS — K922 Gastrointestinal hemorrhage, unspecified: Secondary | ICD-10-CM | POA: Insufficient documentation

## 2024-04-08 DIAGNOSIS — R109 Unspecified abdominal pain: Secondary | ICD-10-CM

## 2024-04-08 LAB — COMPREHENSIVE METABOLIC PANEL WITH GFR
ALT: 46 U/L — ABNORMAL HIGH (ref 0–44)
AST: 42 U/L — ABNORMAL HIGH (ref 15–41)
Albumin: 4.3 g/dL (ref 3.5–5.0)
Alkaline Phosphatase: 114 U/L (ref 38–126)
Anion gap: 11 (ref 5–15)
BUN: 26 mg/dL — ABNORMAL HIGH (ref 8–23)
CO2: 21 mmol/L — ABNORMAL LOW (ref 22–32)
Calcium: 9.5 mg/dL (ref 8.9–10.3)
Chloride: 106 mmol/L (ref 98–111)
Creatinine, Ser: 0.99 mg/dL (ref 0.61–1.24)
GFR, Estimated: >60 mL/min
Glucose, Bld: 124 mg/dL — ABNORMAL HIGH (ref 70–99)
Potassium: 3.9 mmol/L (ref 3.5–5.1)
Sodium: 139 mmol/L (ref 135–145)
Total Bilirubin: 0.5 mg/dL (ref 0.0–1.2)
Total Protein: 7.1 g/dL (ref 6.5–8.1)

## 2024-04-08 LAB — CBC
HCT: 41.2 % (ref 39.0–52.0)
Hemoglobin: 13.8 g/dL (ref 13.0–17.0)
MCH: 31.1 pg (ref 26.0–34.0)
MCHC: 33.5 g/dL (ref 30.0–36.0)
MCV: 92.8 fL (ref 80.0–100.0)
Platelets: 152 K/uL (ref 150–400)
RBC: 4.44 MIL/uL (ref 4.22–5.81)
RDW: 12.7 % (ref 11.5–15.5)
WBC: 5.4 K/uL (ref 4.0–10.5)
nRBC: 0 % (ref 0.0–0.2)

## 2024-04-08 LAB — URINALYSIS, ROUTINE W REFLEX MICROSCOPIC
Bilirubin Urine: NEGATIVE
Glucose, UA: >=500 mg/dL — AB
Hgb urine dipstick: NEGATIVE
Ketones, ur: 5 mg/dL — AB
Leukocytes,Ua: NEGATIVE
Nitrite: NEGATIVE
Protein, ur: NEGATIVE mg/dL
Specific Gravity, Urine: 1.028 (ref 1.005–1.030)
Squamous Epithelial / HPF: 0 /HPF (ref 0–5)
WBC, UA: 0 WBC/hpf (ref 0–5)
pH: 5 (ref 5.0–8.0)

## 2024-04-08 LAB — LIPASE, BLOOD: Lipase: 67 U/L — ABNORMAL HIGH (ref 11–51)

## 2024-04-08 MED ORDER — DICYCLOMINE HCL 10 MG PO CAPS: 10.0000 mg | ORAL_CAPSULE | Freq: Three times a day (TID) | ORAL | 0 refills | Status: AC | PRN

## 2024-04-08 MED ORDER — SODIUM CHLORIDE 0.9 % IV BOLUS
1000.0000 mL | Freq: Once | INTRAVENOUS | Status: AC
  Administered 2024-04-08: 1000 mL via INTRAVENOUS

## 2024-04-08 MED ORDER — FENTANYL CITRATE (PF) 50 MCG/ML IJ SOSY
50.0000 ug | PREFILLED_SYRINGE | Freq: Once | INTRAMUSCULAR | Status: AC
  Administered 2024-04-08: 50 ug via INTRAVENOUS
  Filled 2024-04-08: qty 1

## 2024-04-08 MED ORDER — IOHEXOL 350 MG/ML SOLN
100.0000 mL | Freq: Once | INTRAVENOUS | Status: AC | PRN
  Administered 2024-04-08: 100 mL via INTRAVENOUS

## 2024-04-08 NOTE — ED Triage Notes (Signed)
 Pt ambulatory to triage.  Pt reports diarrhea and rectal bleeding for 5 days.  Pt has abd pain.  Reports feeling light headed   pt alert  speech clear.

## 2024-04-08 NOTE — ED Provider Notes (Signed)
 "  [facility] Provider Note    Event Date/Time   First MD Initiated Contact with Patient 04/08/24 1700     (approximate)   History   Diarrhea and Rectal Bleeding   HPI  [name]. is a 64 y.o. male who presents to the emergency department today because of concern for abdominal pain and bloody diarrhea.  Patient states symptoms started about 5 days ago.  The pain is somewhat diffuse although it appears to be worse in the upper abdomen.  He says that it is worse when he tries to have a bowel movement.  He states that the blood he is seeing is red blood.  It is mixed with the stool.  Does have a history of rectal bleeding in the past which she states is secondary to hemorrhoids.  Patient denies any fevers.     Physical Exam   Triage Vital Signs: ED Triage Vitals [04/08/24 1553]  Encounter Vitals Group     BP 113/80     Girls Systolic BP Percentile      Girls Diastolic BP Percentile      Boys Systolic BP Percentile      Boys Diastolic BP Percentile      Pulse Rate 71     Resp 20     Temp 98 F (36.7 C)     Temp Source Oral     SpO2 96 %     Weight 163 lb (73.9 kg)     Height 5' 4 (1.626 m)     Head Circumference      Peak Flow      Pain Score 8     Pain Loc      Pain Education      Exclude from Growth Chart     Most recent vital signs: Vitals:   04/08/24 1553  BP: 113/80  Pulse: 71  Resp: 20  Temp: 98 F (36.7 C)  SpO2: 96%   General: Awake, alert, oriented. CV:  Good peripheral perfusion. Regular rate and rhythm. Resp:  Normal effort. Lungs clear. Abd:  No distention. Non tender.  ED Results / Procedures / Treatments   Labs (all labs ordered are listed, but only abnormal results are displayed) Labs Reviewed  LIPASE, BLOOD - Abnormal; Notable for the following components:      Result Value   Lipase 67 (*)    All other components within normal limits  COMPREHENSIVE METABOLIC PANEL WITH GFR - Abnormal; Notable for the  following components:   CO2 21 (*)    Glucose, Bld 124 (*)    BUN 26 (*)    AST 42 (*)    ALT 46 (*)    All other components within normal limits  CBC  URINALYSIS, ROUTINE W REFLEX MICROSCOPIC     EKG  None   RADIOLOGY I independently interpreted and visualized the CTA GI bleed. My interpretation: No bleed Radiology interpretation:  IMPRESSION:  1. No active GI bleeding.  2. Scattered sigmoid diverticula without evidence of diverticulitis.      PROCEDURES:  Critical Care performed: No    MEDICATIONS ORDERED IN ED: Medications - No data to display   IMPRESSION / MDM / ASSESSMENT AND PLAN / ED COURSE  I reviewed the triage vital signs and the nursing notes.                              Differential diagnosis  includes, but is not limited to, hemorrhoids, AVM, neoplasm, diverticula, gastritis  Patient's presentation is most consistent with acute presentation with potential threat to life or bodily function.   Patient presented to the emergency department today because of concerns for abdominal pain and GI bleed.  On exam patient's abdomen is benign.  Blood work without any anemia.  Patient is neither tachycardic nor hypotensive.  Did obtain a CT GI bleed study.  This did not show any acute bleed.  This time somewhat unclear etiology of the patient but however given stable vital signs and lack of anemia I think it is reasonable for patient be discharged.  Will give patient medication to help with his pain.      FINAL CLINICAL IMPRESSION(S) / ED DIAGNOSES   Final diagnoses:  Lower GI bleed  Abdominal pain, unspecified abdominal location      Note:  This document was prepared using Dragon voice recognition software and may include unintentional dictation errors.    Floy Roberts, MD 04/08/24 2124  "

## 2024-04-08 NOTE — Progress Notes (Signed)
 Patient notified

## 2024-04-09 LAB — LIPASE: Lipase: 25 U/L (ref 13–78)

## 2024-04-09 LAB — SPECIMEN STATUS REPORT

## 2024-04-10 ENCOUNTER — Other Ambulatory Visit: Payer: Self-pay

## 2024-04-10 NOTE — Patient Outreach (Signed)
 LCSW called patient for initial visit and patient's wife explained that patient had gone to the store. LCSW asked if LCSW could reschedule appointment for next week when patient would be home. Patient's wife explained that she assist with his medication therefore LCSW asked if she could be available for the visit it well. LCSW scheduled visit for 04/16/2024 at 2:30 PM.  Olam Ally, MSW, LCSW [place]  [facility], [facility] Licensed Clinical Social Worker Direct Dial : [phone]

## 2024-04-14 NOTE — Progress Notes (Signed)
 Patient notified

## 2024-04-16 ENCOUNTER — Other Ambulatory Visit

## 2024-04-16 DIAGNOSIS — F341 Dysthymic disorder: Secondary | ICD-10-CM

## 2024-04-16 NOTE — Patient Outreach (Signed)
 Complex Care Management   Visit Note  04/16/2024  Name:  [name]. MRN: [id number] DOB: 08/11/1960  Situation: Referral received for Complex Care Management related to Mental/Behavioral Health diagnosis depression and SDOH Barriers:  Food insecurity and utilities. I obtained verbal consent from Patient.  Visit completed with Patient  on the phone.  Background:   Past Medical History:  Diagnosis Date   Arrhythmia    Asthma    CCC (chronic calculous cholecystitis)    COPD (chronic obstructive pulmonary disease) (HCC)    Cyst of kidney, acquired    Diabetes mellitus without complication (HCC) 2013   type 2   Diverticulosis    Edentulous    Fatty liver    Gallbladder polyp    GERD (gastroesophageal reflux disease)    Heart murmur    History of chicken pox    History of measles as a child    History of PKU    Hyperlipidemia    Hypertension    IBS (irritable bowel syndrome)    Irregular heart beat    Mentally challenged    Pancreatitis    PONV (postoperative nausea and vomiting)    Stroke (HCC) 03/02/2019   vision issues - right eye/right sided weakness   Vision impairment    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    Assessment: Patient Reported Symptoms:  Cognitive Cognitive Status: Struggling with memory recall, Alert and oriented to person, place, and time, Requires Assistance Decision Making Cognitive/Intellectual Conditions Management [RPT]: None reported or documented in medical history or problem list      Neurological Neurological Review of Symptoms: Dizziness (Patient reports after taking a certain medication he can be dizzy) Neurological Management Strategies: Adequate rest  HEENT HEENT Symptoms Reported: Sudden change or loss of vision (Patient reports hard to hear at times) HEENT Management Strategies: Adequate rest    Cardiovascular Cardiovascular Symptoms Reported: Chest pain or discomfort (patient reports slight chest pains and will take  medication) Does patient have uncontrolled Hypertension?: No Cardiovascular Management Strategies: Medication therapy  Respiratory Respiratory Symptoms Reported: Shortness of breath Other Respiratory Symptoms: Patient reports asthma Respiratory Management Strategies: Medication therapy, Adequate rest  Endocrine Endocrine Symptoms Reported: Shakiness Is patient diabetic?: Yes Is patient checking blood sugars at home?: Yes List most recent blood sugar readings, include date and time of day: Last reading of 161 at lunch Endocrine Comment: Patient trying to control readings  Gastrointestinal Gastrointestinal Symptoms Reported: Abdominal pain or discomfort Additional Gastrointestinal Details: Patient reports PCP too stool sample Gastrointestinal Management Strategies: Adequate rest    Genitourinary Genitourinary Symptoms Reported: Urgency, Frequency Additional Genitourinary Details: Patient reports going to the bathroom alot. Genitourinary Management Strategies: Adequate rest  Integumentary Integumentary Symptoms Reported: Other Other Integumentary Symptoms: Patient reports skin is hard to heal and taking cream Skin Management Strategies: Medication therapy  Musculoskeletal Musculoskelatal Symptoms Reviewed: Limited mobility, Unsteady gait   Falls in the past year?: Yes Number of falls in past year: 2 or more Was there an injury with Fall?: No (Patient reports chest was hurting) Fall Risk Category Calculator: 2 Patient Fall Risk Level: Moderate Fall Risk Patient at Risk for Falls Due to: History of fall(s), Other (Comment) (Patient reports blood sugar being low) Fall risk Follow up: Falls evaluation completed  Psychosocial Psychosocial Symptoms Reported: Anxiety - if selected complete GAD, Depression - if selected complete PHQ 2-9 Additional Psychological Details: Patient anxiety is miniumal Behavioral Management Strategies: Support system (Patient is not seeking treatment at this  time) Behavioral  Health Comment: LCSW discussed with patient some triggers for depression Major Change/Loss/Stressor/Fears (CP): Death of a loved one, Medical condition, self, Resources Techniques to [employer] with Loss/Stress/Change:  (Patient is declining therapy or medication) Quality of Family Relationships: supportive, helpful Do you feel physically threatened by others?: No    04/16/2024    PHQ2-9 Depression Screening   Little interest or pleasure in doing things Nearly every day  Feeling down, depressed, or hopeless More than half the days  PHQ-2 - Total Score 5  Trouble falling or staying asleep, or sleeping too much Not at all  Feeling tired or having little energy Nearly every day  Poor appetite or overeating  Not at all  Feeling bad about yourself - or that you are a failure or have let yourself or your family down Not at all  Trouble concentrating on things, such as reading the newspaper or watching television Nearly every day  Moving or speaking so slowly that other people could have noticed.  Or the opposite - being so fidgety or restless that you have been moving around a lot more than usual Several days  Thoughts that you would be better off dead, or hurting yourself in some way Not at all  PHQ2-9 Total Score 12  If you checked off any problems, how difficult have these problems made it for you to do your work, take care of things at home, or get along with other people Somewhat difficult  Depression Interventions/Treatment      There were no vitals filed for this visit. Pain Scale: 0-10 Pain Score: 0-No pain  Medications Reviewed Today     Reviewed by Sherren Olam BRAVO, LCSW (Social Worker) on 04/16/24 at 1449  Med List Status: <None>   Medication Order Taking? Sig Documenting Provider Last Dose Status Informant  Accu-Chek Softclix Lancets lancets [id number] Yes 1 each by Other route 5 (five) times daily. Use as instructed Cannady, Jolene T, NP  Active Spouse/Significant  Other, Pharmacy Records  acetaminophen  (TYLENOL ) 500 MG tablet [id number] Yes Take 500 mg by mouth every 6 (six) hours as needed. [provider]  Active Spouse/Significant Other, Pharmacy Records  albuterol  (VENTOLIN  HFA) 108 [id number] Base) MCG/ACT inhaler [id number] Yes Inhale 2 puffs into the lungs every 6 (six) hours as needed. Cannady, Jolene T, NP  Active   aspirin  EC 81 MG tablet [id number] Yes Take 1 tablet (81 mg total) by mouth daily. Cannady, Jolene T, NP  Active   Blood Glucose Monitoring Suppl (ACCU-CHEK AVIVA PLUS) w/Device KIT [id number] Yes 1 each by Does not apply route 5 (five) times daily. Valerio Melanie DASEN, NP  Active Spouse/Significant Other, Pharmacy Records  Blood Pressure Monitoring (OMRON 3 SERIES BP MONITOR) DEVI [id number] Yes by Does not apply route. Use to monitor and record home blood pressure daily [provider]  Active   BREO ELLIPTA  100-25 MCG/ACT AEPB [id number] Yes INHALE 1 PUFF BY MOUTH ONCE DAILY. RINSE MOUTH AFTER USE. Cannady, Jolene T, NP  Active   clopidogrel  (PLAVIX ) 75 MG tablet [id number] Yes Take 1 tablet (75 mg total) by mouth daily. Valerio Melanie T, NP  Active Spouse/Significant Other, Pharmacy Records  cyanocobalamin  (VITAMIN B12) 1000 MCG tablet [id number]  Take 1 tablet (1,000 mcg total) by mouth daily.  Patient not taking: Reported on 04/16/2024   Cannady, Jolene T, NP  Active   dicyclomine  (BENTYL ) 10 MG capsule [id number] Yes Take 1 capsule (10 mg total) by mouth 3 (three) times daily as needed. Floy Roberts, MD  Active   empagliflozin  (JARDIANCE ) 25 MG TABS tablet [id number] Yes Take 1 tablet (25 mg total) by mouth daily. Valerio Melanie DASEN, NP  Active Spouse/Significant Other, Pharmacy Records  EPINEPHrine  0.3 mg/0.3 mL IJ SOAJ injection [id number] Yes INJECT 1 SYRINGE INTO OUTER THIGH ONCE AS NEEDED FOR SEVERE ALLERGIC REACTION. Strength 0.3 MG/0.3ML Cannady, Jolene T, NP  Active Spouse/Significant Other, Pharmacy Records           Med  Note SIGRID, TIFFANY N   Sun Sep 22, 2023 12:46 AM) PRN  fenofibrate  54 MG tablet [id number] Yes Take 1 tablet (54 mg total) by mouth daily. Cannady, Jolene T, NP  Active Spouse/Significant Other, Pharmacy Records  gabapentin  (NEURONTIN ) 300 MG capsule [id number] Yes Take 1 capsule (300 mg total) by mouth 2 (two) times daily. Valerio Melanie DASEN, NP  Active Spouse/Significant Other, Pharmacy Records  glucose blood (ACCU-CHEK AVIVA PLUS) test strip [id number] Yes 1 each by Other route 5 (five) times daily. Use as instructed Cannady, Jolene T, NP  Active Spouse/Significant Other, Pharmacy Records  insulin  degludec (TRESIBA ) 100 UNIT/ML FlexTouch Pen [id number] Yes Inject 45 Units into the skin 2 (two) times daily. Cannady, Jolene T, NP  Active   insulin  lispro (HUMALOG  KWIKPEN) 100 UNIT/ML KwikPen [id number] Yes 0 units blood sugars </= 150; 3 units blood sugars 151-200; 6 units blood sugars 201-250; 9 units blood sugars 215-300; 12 units blood sugars =/>301 Fernand Fredy RAMAN, MD  Active   Insulin  Pen Needle (PEN NEEDLES) 32G X 4 MM MISC [id number] Yes Use to inject insulin  (patient using 4 injections daily) Cannady, Jolene T, NP  Active   isosorbide  mononitrate (IMDUR ) 30 MG 24 hr tablet [id number] Yes Take 1 tablet (30 mg total) by mouth daily. Fernand Denyse LABOR, MD  Active   JANUVIA  100 MG tablet [id number] Yes Take 100 mg by mouth daily. [provider]  Active   KERENDIA 20 MG TABS [id number] Yes Take 1 tablet by mouth daily. [provider]  Active   lisinopril  (ZESTRIL ) 20 MG tablet [id number] Yes Take 1 tablet (20 mg total) by mouth daily. Valerio Melanie DASEN, NP  Active Spouse/Significant Other, Pharmacy Records  Misc. Devices (PULSE OXIMETER FOR FINGER) MISC [id number]  To check O2 saturations once daily with asthma and document + check if any shortness of breath  Patient not taking: Reported on 04/03/2024   Cannady, Jolene T, NP  Active Spouse/Significant Other, Pharmacy Records  montelukast   (SINGULAIR ) 10 MG tablet [id number] Yes Take 1 tablet (10 mg total) by mouth at bedtime. Valerio Melanie DASEN, NP  Active Spouse/Significant Other, Pharmacy Records  mupirocin  ointment (BACTROBAN ) 2 % [id number] Yes Apply 1 Application topically 2 (two) times daily. Valerio Melanie T, NP  Active   nitroGLYCERIN  (NITROSTAT ) 0.4 MG SL tablet [id number] Yes DISSOLVE 1 TABLET UNDER THE TONGUE EVERY5 MINS AS NEEDED FOR CHEST PAIN. MAY TAKE UP TO 3 DOSES Cannady, Jolene T, NP  Active   pantoprazole  (PROTONIX ) 40 MG tablet [id number] Yes TAKE 1 TABLET BY MOUTH ONCE DAILY Fernand Denyse LABOR, MD  Active Spouse/Significant Other, Pharmacy Records  Resmetirom  (REZDIFFRA ) 60 MG TABS [id number] Yes Take 1 tablet by mouth daily. Unk Corinn Skiff, MD  Active Spouse/Significant Other, Pharmacy Records  rosuvastatin  (CRESTOR ) 40 MG tablet [id number] Yes Take 1 tablet (40 mg total) by mouth daily. Stop 20 MG tablets. Valerio Melanie T, NP  Active   TRUEPLUS INSULIN  SYRINGE 31G X 5/16 1 ML MISC [id number]    Patient not taking: Reported on 04/16/2024  [provider]  Active   valACYclovir  (VALTREX ) 500 MG tablet [id number] Yes Take 500 mg by mouth 3 (three) times daily. [provider]  Active Spouse/Significant Other, Pharmacy Records  Vitamin D , Ergocalciferol , (DRISDOL ) 1.25 MG (50000 UNIT) CAPS capsule [id number] Yes Take 1 capsule (50,000 Units total) by mouth once a week. Valerio Melanie DASEN, NP  Active             Recommendation:   Continue Current Plan of Care  Follow Up Plan:   Telephone follow-up 05/07/2024 at 2:00 PM  Olam Ally, MSW, LCSW [place]  [facility], [facility] Licensed Clinical Social Worker Direct Dial : [phone]

## 2024-04-16 NOTE — Patient Instructions (Signed)
 Visit Information  Thank you for taking time to visit with me today. Please don't hesitate to contact me if I can be of assistance to you before our next scheduled appointment.  Our next appointment is by telephone on 05/07/2024 at 2:00 PM Please call the care guide team at [phone] if you need to cancel or reschedule your appointment.   Following is a copy of your care plan:   Goals Addressed             This Visit's Progress    VBCI Social Work Care Plan       Problems:   Disease Management support and education needs related to Depression: difficulty concentrating and Food Insecurity  and Utilities.   CSW Clinical Goal(s):   Over the next 90 days the Patient will work with Child Psychotherapist to address concerns related to depression and SDOH needs of food insecurity and utilities as evidenced by utilizing resources that will be provided.  Over the next two weeks, patient will utilize community resources for SDOH needs resources.  Interventions:  Social Determinants of Health in Patient with Depression: difficulty concentrating: SDOH assessments completed: Housing  and Utilities. Evaluation of current treatment plan related to unmet needs LCSW made referral to BSW for 04/24/2024 at 10:00a m Mental Health:  Evaluation of current treatment plan related to Depression: difficulty concentrating Active listening / Reflection utilized Behavioral Activation reviewed [employer] / information provided- 988 Discussed referral options to connect for ongoing therapy: Patient is declining therapy at this time. Patient is not seeking medication for mental health concerns at this time. Emotional Support Provided Motivational Interviewing employed PHQ2/PHQ9 completed Depression screen reviewed Problem Solving /Task Center strategies reviewed Solution-Focued Strategies employed: Suicidal Ideation/Homicidal Ideation assessed: Patient has no plan/ intent LCSW discussed triggers  for depression. GAD 7 completed and results discussed.   Patient Goals/Self-Care Activities:  Coordinate with BSW for SDOH needs.             Coordinate with LCSW for depression,  Plan:   Telephone follow up appointment with care management team member scheduled for:  05/07/2024 at 2:00 PM.        Please call the Suicide and Crisis Lifeline: 988 if you are experiencing a Mental Health or Behavioral Health Crisis or need someone to talk to.  Patient verbalized understanding of Care plan and visit instructions communicated this visit  Olam Ally, MSW, LCSW [place]  [facility], [facility] Licensed Clinical Social Worker Direct Dial : [phone]

## 2024-04-17 ENCOUNTER — Telehealth: Payer: Self-pay | Admitting: Internal Medicine

## 2024-04-17 ENCOUNTER — Telehealth: Payer: Self-pay

## 2024-04-17 NOTE — Progress Notes (Signed)
 Complex Care Management Note Care Guide Note  04/17/2024 Name: [name]. MRN: [id number] DOB: 03-13-1961   Complex Care Management Outreach Attempts: An unsuccessful telephone outreach was attempted today to offer the patient information about available complex care management services.  Follow Up Plan:  Additional outreach attempts will be made to offer the patient complex care management information and services.   Encounter Outcome:  No Answer  Anivea Velasques [facility]  [facility] Guide Direct Dial : [phone]  Fax: [phone] Website: [email or web address]

## 2024-04-17 NOTE — Telephone Encounter (Signed)
 Patient left VM asking if his paperwork for the [facility] is ready yet.   Please advise.

## 2024-04-21 ENCOUNTER — Telehealth: Payer: Self-pay

## 2024-04-21 ENCOUNTER — Other Ambulatory Visit: Payer: Self-pay | Admitting: Internal Medicine

## 2024-04-21 DIAGNOSIS — E1165 Type 2 diabetes mellitus with hyperglycemia: Secondary | ICD-10-CM

## 2024-04-21 DIAGNOSIS — Z8673 Personal history of transient ischemic attack (TIA), and cerebral infarction without residual deficits: Secondary | ICD-10-CM

## 2024-04-21 NOTE — Progress Notes (Signed)
 Complex Care Management Note Care Guide Note  04/21/2024 Name: [name]. MRN: [id number] DOB: 11-Mar-1961  [name]. is a 64 y.o. year old male who is a primary care patient of Fernand Fredy RAMAN, MD . The community resource team was consulted for assistance with Transportation Needs , Food Insecurity, and Financial Difficulties related to utility assistance.  SDOH screenings and interventions completed:  Yes  Social Drivers of Health From This Encounter   Food Insecurity: Food Insecurity Present (04/21/2024)   Epic    Worried About Programme Researcher, Broadcasting/film/video in the Last Year: Sometimes true    Ran Out of Food in the Last Year: Sometimes true  Housing: Low Risk (04/21/2024)   Epic    Unable to Pay for Housing in the Last Year: No    Number of Times Moved in the Last Year: 0    Homeless in the Last Year: No  Financial Resource Strain: High Risk (04/21/2024)   Overall Financial Resource Strain (CARDIA)    Difficulty of Paying Living Expenses: Very hard  Transportation Needs: No Transportation Needs (04/21/2024)   Epic    Lack of Transportation (Medical): No    Lack of Transportation (Non-Medical): No  Utilities: Patient Declined (04/21/2024)   Epic    Threatened with loss of utilities: Patient declined    SDOH Interventions Today    Flowsheet Row Most Recent Value  SDOH Interventions   Food Insecurity Interventions Other (Comment)  [Patient is currently receiving SNAP benefits and frequents local food pantries. Per request mailing a list of additional local food pantries.]  Transportation Interventions Other (Comment), Payor Benefit  [Mailing information for Medicaid Transportation benefit.]  Utilities Interventions Other (Comment)  [Per patient's spouse request mailing resources for utility assistance. Medical Laboratory Scientific Officer, [employer], [employer] and [employer] emergency financial help.]  Financial Strain Interventions Other (Comment)  [Per patient's spouse  request mailing resources for utility assistance. Medical Laboratory Scientific Officer, LIEAP, [employer] and [employer] emergency financial help.]     Care guide performed the following interventions: Patient provided with information about care guide support team and interviewed to confirm resource needs.  Follow Up Plan:  Client will call if mailed resources are not received and No further follow up planned at this time. The patient has been provided with needed resources.  Encounter Outcome:  Patient Visit Completed  [name] [facility]  [facility] Resource Care Guide Direct Dial : [phone]  Fax: [phone] Website: [email or web address]

## 2024-04-21 NOTE — Progress Notes (Signed)
optha

## 2024-04-24 ENCOUNTER — Other Ambulatory Visit: Payer: Self-pay

## 2024-04-24 DIAGNOSIS — I152 Hypertension secondary to endocrine disorders: Secondary | ICD-10-CM

## 2024-04-24 NOTE — Patient Instructions (Signed)
 Visit Information  Thank you for taking time to visit with me today. Please don't hesitate to contact me if I can be of assistance to you before our next scheduled appointment.  Our next appointment is by telephone on 04/30/24 at 10:30am  Please call the care guide team at [phone] if you need to cancel or reschedule your appointment.   Following is a copy of your care plan:   Goals Addressed             This Visit's Progress    BSW Goals       Current SDOH Barriers:  Financial constraints related to income and high bills  Interventions: Patient interviewed and appropriate screenings performed Referred patient to community resources  Provided patient with information about DSS-LIEAP Discussed plans with patient for ongoing follow up and provided patient with direct contact number Advised patient to contact DSS to apply for LIEAP before funds run out.  This will assist with the next months bill. Patient agreed to call today. Patient is in an ongoing crisis.  Rent and lights are between $1070-$1170, leaving about $300 for other bills.  Wife reports they are not in crisis with food but still run a little low at times. Patient has a few medications that the insurance requires a copay and was told to speak to the doctor to see if changes could be made.  The trailer needs repairs but they can't afford the repairs. Patient is not 74 and can't apply for Home Rehab program.  Their car is in need of repair. SW to complete a pharmacy referral.          Please call 911 if you are experiencing a Mental Health or Behavioral Health Crisis or need someone to talk to.  Wife Griffin Gerrard verbalized understanding of Care plan and visit instructions communicated this visit  Tillman Gardener, BSW [place]  [facility], [facility] Social Worker Direct Dial : [phone]  Fax: [phone] Website: [email or web address]

## 2024-04-27 ENCOUNTER — Ambulatory Visit: Admitting: Internal Medicine

## 2024-04-30 ENCOUNTER — Telehealth

## 2024-05-04 ENCOUNTER — Other Ambulatory Visit

## 2024-05-05 ENCOUNTER — Other Ambulatory Visit

## 2024-05-07 ENCOUNTER — Telehealth

## 2024-05-21 ENCOUNTER — Ambulatory Visit: Admitting: Cardiovascular Disease

## 2024-06-08 ENCOUNTER — Ambulatory Visit: Admitting: Pulmonary Disease

## 2024-06-22 ENCOUNTER — Ambulatory Visit: Admitting: Nurse Practitioner

## 2024-06-29 ENCOUNTER — Encounter (INDEPENDENT_AMBULATORY_CARE_PROVIDER_SITE_OTHER)

## 2024-06-29 ENCOUNTER — Ambulatory Visit (INDEPENDENT_AMBULATORY_CARE_PROVIDER_SITE_OTHER): Admitting: Vascular Surgery

## 2024-07-14 ENCOUNTER — Ambulatory Visit

## 2024-11-30 ENCOUNTER — Encounter (INDEPENDENT_AMBULATORY_CARE_PROVIDER_SITE_OTHER)

## 2024-11-30 ENCOUNTER — Ambulatory Visit (INDEPENDENT_AMBULATORY_CARE_PROVIDER_SITE_OTHER): Admitting: Vascular Surgery
